# Patient Record
Sex: Female | Born: 1935 | Race: White | Hispanic: No | Smoking: Former smoker
Health system: Southern US, Community
[De-identification: ages and names within clinical notes are randomized; demographics above are authoritative.]

## PROBLEM LIST (undated history)

## (undated) DIAGNOSIS — R519 Headache, unspecified: Secondary | ICD-10-CM

## (undated) DIAGNOSIS — F329 Major depressive disorder, single episode, unspecified: Secondary | ICD-10-CM

## (undated) DIAGNOSIS — I219 Acute myocardial infarction, unspecified: Secondary | ICD-10-CM

## (undated) DIAGNOSIS — R55 Syncope and collapse: Secondary | ICD-10-CM

## (undated) DIAGNOSIS — M542 Cervicalgia: Secondary | ICD-10-CM

## (undated) DIAGNOSIS — Z950 Presence of cardiac pacemaker: Secondary | ICD-10-CM

## (undated) DIAGNOSIS — E119 Type 2 diabetes mellitus without complications: Secondary | ICD-10-CM

## (undated) DIAGNOSIS — M1990 Unspecified osteoarthritis, unspecified site: Secondary | ICD-10-CM

## (undated) DIAGNOSIS — G8929 Other chronic pain: Secondary | ICD-10-CM

## (undated) DIAGNOSIS — R32 Unspecified urinary incontinence: Secondary | ICD-10-CM

## (undated) DIAGNOSIS — S129XXA Fracture of neck, unspecified, initial encounter: Secondary | ICD-10-CM

## (undated) DIAGNOSIS — I509 Heart failure, unspecified: Secondary | ICD-10-CM

## (undated) DIAGNOSIS — Z9289 Personal history of other medical treatment: Secondary | ICD-10-CM

## (undated) DIAGNOSIS — R296 Repeated falls: Secondary | ICD-10-CM

## (undated) DIAGNOSIS — E039 Hypothyroidism, unspecified: Secondary | ICD-10-CM

## (undated) DIAGNOSIS — R51 Headache: Secondary | ICD-10-CM

## (undated) DIAGNOSIS — S42301A Unspecified fracture of shaft of humerus, right arm, initial encounter for closed fracture: Secondary | ICD-10-CM

## (undated) DIAGNOSIS — I639 Cerebral infarction, unspecified: Secondary | ICD-10-CM

## (undated) DIAGNOSIS — J42 Unspecified chronic bronchitis: Secondary | ICD-10-CM

## (undated) DIAGNOSIS — M797 Fibromyalgia: Secondary | ICD-10-CM

## (undated) DIAGNOSIS — I10 Essential (primary) hypertension: Secondary | ICD-10-CM

## (undated) DIAGNOSIS — C4491 Basal cell carcinoma of skin, unspecified: Secondary | ICD-10-CM

## (undated) DIAGNOSIS — D649 Anemia, unspecified: Secondary | ICD-10-CM

## (undated) DIAGNOSIS — E1142 Type 2 diabetes mellitus with diabetic polyneuropathy: Secondary | ICD-10-CM

## (undated) DIAGNOSIS — F419 Anxiety disorder, unspecified: Secondary | ICD-10-CM

## (undated) DIAGNOSIS — R531 Weakness: Secondary | ICD-10-CM

## (undated) HISTORY — DX: Unspecified fracture of shaft of humerus, right arm, initial encounter for closed fracture: S42.301A

## (undated) HISTORY — DX: Syncope and collapse: R55

## (undated) HISTORY — PX: EYE SURGERY: SHX253

## (undated) HISTORY — PX: INSERT / REPLACE / REMOVE PACEMAKER: SUR710

## (undated) HISTORY — DX: Major depressive disorder, single episode, unspecified: F32.9

## (undated) HISTORY — PX: COLONOSCOPY: SHX174

## (undated) HISTORY — PX: ANKLE FRACTURE SURGERY: SHX122

## (undated) HISTORY — PX: OVARIAN CYST SURGERY: SHX726

## (undated) HISTORY — PX: BASAL CELL CARCINOMA EXCISION: SHX1214

## (undated) HISTORY — DX: Repeated falls: R29.6

## (undated) HISTORY — PX: CATARACT EXTRACTION W/ INTRAOCULAR LENS  IMPLANT, BILATERAL: SHX1307

## (undated) HISTORY — PX: GASTRIC BYPASS: SHX52

## (undated) HISTORY — PX: CARPAL TUNNEL RELEASE: SHX101

## (undated) HISTORY — PX: ABDOMINAL HYSTERECTOMY: SHX81

## (undated) HISTORY — PX: CARDIAC CATHETERIZATION: SHX172

## (undated) HISTORY — PX: CHOLECYSTECTOMY OPEN: SUR202

## (undated) HISTORY — DX: Weakness: R53.1

## (undated) HISTORY — DX: Personal history of other medical treatment: Z92.89

## (undated) HISTORY — PX: FRACTURE SURGERY: SHX138

## (undated) HISTORY — PX: FEMUR FRACTURE SURGERY: SHX633

## (undated) HISTORY — PX: TONSILLECTOMY: SUR1361

---

## 1998-03-25 DIAGNOSIS — I639 Cerebral infarction, unspecified: Secondary | ICD-10-CM

## 1998-03-25 HISTORY — DX: Cerebral infarction, unspecified: I63.9

## 2012-08-07 ENCOUNTER — Observation Stay (HOSPITAL_COMMUNITY)
Admission: AD | Admit: 2012-08-07 | Discharge: 2012-08-12 | Disposition: A | Discharge status: Skilled Nursing Facility | Payer: Medicare Other | Attending: Internal Medicine | Admitting: Internal Medicine

## 2012-08-07 ENCOUNTER — Encounter (HOSPITAL_COMMUNITY): Payer: Self-pay | Admitting: *Deleted

## 2012-08-07 ENCOUNTER — Emergency Department (HOSPITAL_COMMUNITY): Payer: Medicare Other

## 2012-08-07 ENCOUNTER — Inpatient Hospital Stay (HOSPITAL_COMMUNITY): Payer: Medicare Other

## 2012-08-07 DIAGNOSIS — I2510 Atherosclerotic heart disease of native coronary artery without angina pectoris: Secondary | ICD-10-CM | POA: Insufficient documentation

## 2012-08-07 DIAGNOSIS — R7402 Elevation of levels of lactic acid dehydrogenase (LDH): Secondary | ICD-10-CM | POA: Insufficient documentation

## 2012-08-07 DIAGNOSIS — S82892D Other fracture of left lower leg, subsequent encounter for closed fracture with routine healing: Secondary | ICD-10-CM

## 2012-08-07 DIAGNOSIS — Z9181 History of falling: Secondary | ICD-10-CM | POA: Insufficient documentation

## 2012-08-07 DIAGNOSIS — E119 Type 2 diabetes mellitus without complications: Secondary | ICD-10-CM | POA: Insufficient documentation

## 2012-08-07 DIAGNOSIS — R296 Repeated falls: Secondary | ICD-10-CM

## 2012-08-07 DIAGNOSIS — E538 Deficiency of other specified B group vitamins: Secondary | ICD-10-CM | POA: Insufficient documentation

## 2012-08-07 DIAGNOSIS — E876 Hypokalemia: Secondary | ICD-10-CM

## 2012-08-07 DIAGNOSIS — E039 Hypothyroidism, unspecified: Secondary | ICD-10-CM | POA: Insufficient documentation

## 2012-08-07 DIAGNOSIS — I6200 Nontraumatic subdural hemorrhage, unspecified: Secondary | ICD-10-CM

## 2012-08-07 DIAGNOSIS — S62109A Fracture of unspecified carpal bone, unspecified wrist, initial encounter for closed fracture: Secondary | ICD-10-CM

## 2012-08-07 DIAGNOSIS — W19XXXA Unspecified fall, initial encounter: Secondary | ICD-10-CM | POA: Insufficient documentation

## 2012-08-07 DIAGNOSIS — Z79899 Other long term (current) drug therapy: Secondary | ICD-10-CM | POA: Insufficient documentation

## 2012-08-07 DIAGNOSIS — Z950 Presence of cardiac pacemaker: Secondary | ICD-10-CM | POA: Insufficient documentation

## 2012-08-07 DIAGNOSIS — I519 Heart disease, unspecified: Secondary | ICD-10-CM | POA: Insufficient documentation

## 2012-08-07 DIAGNOSIS — M25519 Pain in unspecified shoulder: Secondary | ICD-10-CM | POA: Insufficient documentation

## 2012-08-07 DIAGNOSIS — S82109A Unspecified fracture of upper end of unspecified tibia, initial encounter for closed fracture: Secondary | ICD-10-CM | POA: Insufficient documentation

## 2012-08-07 DIAGNOSIS — S62101D Fracture of unspecified carpal bone, right wrist, subsequent encounter for fracture with routine healing: Secondary | ICD-10-CM

## 2012-08-07 DIAGNOSIS — I4891 Unspecified atrial fibrillation: Secondary | ICD-10-CM | POA: Insufficient documentation

## 2012-08-07 DIAGNOSIS — S82899A Other fracture of unspecified lower leg, initial encounter for closed fracture: Secondary | ICD-10-CM

## 2012-08-07 DIAGNOSIS — D649 Anemia, unspecified: Secondary | ICD-10-CM

## 2012-08-07 DIAGNOSIS — Z9884 Bariatric surgery status: Secondary | ICD-10-CM | POA: Insufficient documentation

## 2012-08-07 DIAGNOSIS — IMO0001 Reserved for inherently not codable concepts without codable children: Secondary | ICD-10-CM | POA: Insufficient documentation

## 2012-08-07 DIAGNOSIS — S52599A Other fractures of lower end of unspecified radius, initial encounter for closed fracture: Secondary | ICD-10-CM | POA: Insufficient documentation

## 2012-08-07 DIAGNOSIS — S065X9A Traumatic subdural hemorrhage with loss of consciousness of unspecified duration, initial encounter: Secondary | ICD-10-CM | POA: Diagnosis present

## 2012-08-07 DIAGNOSIS — F329 Major depressive disorder, single episode, unspecified: Secondary | ICD-10-CM | POA: Insufficient documentation

## 2012-08-07 DIAGNOSIS — S065XAA Traumatic subdural hemorrhage with loss of consciousness status unknown, initial encounter: Secondary | ICD-10-CM

## 2012-08-07 DIAGNOSIS — IMO0002 Reserved for concepts with insufficient information to code with codable children: Secondary | ICD-10-CM

## 2012-08-07 DIAGNOSIS — I10 Essential (primary) hypertension: Secondary | ICD-10-CM | POA: Insufficient documentation

## 2012-08-07 DIAGNOSIS — S62101A Fracture of unspecified carpal bone, right wrist, initial encounter for closed fracture: Secondary | ICD-10-CM

## 2012-08-07 DIAGNOSIS — R55 Syncope and collapse: Principal | ICD-10-CM

## 2012-08-07 DIAGNOSIS — F3289 Other specified depressive episodes: Secondary | ICD-10-CM | POA: Insufficient documentation

## 2012-08-07 DIAGNOSIS — F411 Generalized anxiety disorder: Secondary | ICD-10-CM | POA: Insufficient documentation

## 2012-08-07 DIAGNOSIS — F410 Panic disorder [episodic paroxysmal anxiety] without agoraphobia: Secondary | ICD-10-CM | POA: Insufficient documentation

## 2012-08-07 DIAGNOSIS — S82892A Other fracture of left lower leg, initial encounter for closed fracture: Secondary | ICD-10-CM

## 2012-08-07 DIAGNOSIS — R7401 Elevation of levels of liver transaminase levels: Secondary | ICD-10-CM | POA: Insufficient documentation

## 2012-08-07 DIAGNOSIS — Z8673 Personal history of transient ischemic attack (TIA), and cerebral infarction without residual deficits: Secondary | ICD-10-CM | POA: Insufficient documentation

## 2012-08-07 HISTORY — DX: Anemia, unspecified: D64.9

## 2012-08-07 HISTORY — DX: Presence of cardiac pacemaker: Z95.0

## 2012-08-07 HISTORY — DX: Unspecified osteoarthritis, unspecified site: M19.90

## 2012-08-07 HISTORY — DX: Hypothyroidism, unspecified: E03.9

## 2012-08-07 HISTORY — DX: Unspecified urinary incontinence: R32

## 2012-08-07 LAB — CBC WITH DIFFERENTIAL/PLATELET
HCT: 32.2 % — ABNORMAL LOW (ref 36.0–46.0)
Hemoglobin: 10.5 g/dL — ABNORMAL LOW (ref 12.0–15.0)
Lymphocytes Relative: 14 % (ref 12–46)
MCHC: 32.6 g/dL (ref 30.0–36.0)
Monocytes Absolute: 0.5 10*3/uL (ref 0.1–1.0)
Monocytes Relative: 7 % (ref 3–12)
Neutro Abs: 5.6 10*3/uL (ref 1.7–7.7)
WBC: 7.1 10*3/uL (ref 4.0–10.5)

## 2012-08-07 LAB — COMPREHENSIVE METABOLIC PANEL
BUN: 11 mg/dL (ref 6–23)
CO2: 29 mEq/L (ref 19–32)
Chloride: 100 mEq/L (ref 96–112)
Creatinine, Ser: 0.52 mg/dL (ref 0.50–1.10)
GFR calc non Af Amer: >90 mL/min (ref >90)
Total Bilirubin: 0.5 mg/dL (ref 0.3–1.2)

## 2012-08-07 LAB — GLUCOSE, CAPILLARY: Glucose-Capillary: 166 mg/dL — ABNORMAL HIGH (ref 70–99)

## 2012-08-07 MED ORDER — ZOLPIDEM TARTRATE 5 MG PO TABS
5.0000 mg | ORAL_TABLET | Freq: Every evening | ORAL | Status: DC | PRN
  Administered 2012-08-07: 5 mg via ORAL
  Filled 2012-08-07: qty 1

## 2012-08-07 MED ORDER — ZOLPIDEM TARTRATE 10 MG PO TABS
10.0000 mg | ORAL_TABLET | Freq: Every evening | ORAL | Status: DC | PRN
Start: 2012-08-07 — End: 2012-08-07

## 2012-08-07 MED ORDER — SODIUM CHLORIDE 0.9 % IJ SOLN
3.0000 mL | Freq: Two times a day (BID) | INTRAMUSCULAR | Status: DC
  Administered 2012-08-08 – 2012-08-11 (×4): 3 mL via INTRAVENOUS

## 2012-08-07 MED ORDER — FOLIC ACID 1 MG PO TABS
1.0000 mg | ORAL_TABLET | Freq: Every day | ORAL | Status: DC
  Administered 2012-08-08 – 2012-08-12 (×5): 1 mg via ORAL
  Filled 2012-08-07 (×5): qty 1

## 2012-08-07 MED ORDER — CLONAZEPAM 1 MG PO TABS
1.0000 mg | ORAL_TABLET | Freq: Two times a day (BID) | ORAL | Status: DC | PRN
  Administered 2012-08-07: 1 mg via ORAL
  Filled 2012-08-07: qty 1

## 2012-08-07 MED ORDER — GABAPENTIN 300 MG PO CAPS
300.0000 mg | ORAL_CAPSULE | Freq: Every day | ORAL | Status: DC
  Administered 2012-08-07 – 2012-08-11 (×5): 300 mg via ORAL
  Filled 2012-08-07 (×7): qty 1

## 2012-08-07 MED ORDER — AMLODIPINE BESYLATE 10 MG PO TABS
10.0000 mg | ORAL_TABLET | Freq: Every day | ORAL | Status: DC
  Administered 2012-08-07 – 2012-08-12 (×6): 10 mg via ORAL
  Filled 2012-08-07 (×7): qty 1

## 2012-08-07 MED ORDER — HYDROMORPHONE HCL PF 1 MG/ML IJ SOLN
0.5000 mg | Freq: Once | INTRAMUSCULAR | Status: AC
  Administered 2012-08-07: 0.5 mg via INTRAVENOUS
  Filled 2012-08-07: qty 1

## 2012-08-07 MED ORDER — FLUOXETINE HCL 20 MG PO CAPS
20.0000 mg | ORAL_CAPSULE | Freq: Three times a day (TID) | ORAL | Status: DC
  Administered 2012-08-08 – 2012-08-10 (×7): 20 mg via ORAL
  Filled 2012-08-07 (×10): qty 1

## 2012-08-07 MED ORDER — HYDROMORPHONE HCL PF 1 MG/ML IJ SOLN
0.5000 mg | INTRAMUSCULAR | Status: DC | PRN
  Administered 2012-08-07: 0.5 mg via INTRAVENOUS
  Filled 2012-08-07: qty 1

## 2012-08-07 MED ORDER — ALPRAZOLAM 0.25 MG PO TABS: 0.2500 mg | ORAL_TABLET | Freq: Three times a day (TID) | ORAL | Status: DC | PRN

## 2012-08-07 MED ORDER — LEVOTHYROXINE SODIUM 100 MCG PO TABS
100.0000 ug | ORAL_TABLET | Freq: Every day | ORAL | Status: DC
  Administered 2012-08-08 – 2012-08-12 (×5): 100 ug via ORAL
  Filled 2012-08-07 (×7): qty 1

## 2012-08-07 NOTE — Progress Notes (Signed)
CSW received consult from rn cm that patient son was inquiring about placing patient in a skilled nursing facility. CSW met with pt son in conference room. Pt son shared that patient was waiting in the car and wanted to know if we could help placement the patient into a skilled nursing facility.  Pt son shared that patient has a knee fracture and wrist fracture and was directed by the orthopedist that patient would need to be non weight bearing for 10 weeks. Pt son stated that patient lives a lone and recently moved into Independent Living at [facility]. Pt son stated he had inquired about skilled nursing at [facility] however is unable to afford the out of pocket cost as patient did not have a inpatient qualifying stay at this time.  Patient son shared that the facility told patient son to bring patient to ED to get admitted and then the patient would be admittd to skilled nursing with medicare covering.  Pt son and CSW discussed that patient would need an 3 night qualifying inpatient stay in the hospital stay in order for medicare to pay for skilled nursing placement for patient.  Pt son shared he was unable to care for her in his home due to stairs, and pt son did not show interest in hiring someone to stay with patient in independent living, having home health services provided by medicare,  or paying privately for skilled nursing at this time.    CSW and pt discussed that in order to determine if patient needed to be medically admitted to the hospital, patient would have to be evaluated by the ED physician. CSW and pt discussed that if there was not a medical reason for patient to be medically admitted to the hospital then medicare would still not cover skilled nursing, however home health could be covered and the pt family could assist with pt needs or hire private duty nursing. Pt son stated he wasn't sure if he wanted to wait or pay for the patient to be seen by the physician. Patient son  stated that if patient isn't going to be admitted it would be a waste of time. RN CM and CSW explained again that patient would have to be evaluated by physician to determine if patient would need to be medically admitted.   The RN CM tried to reach patient pcp and orthopedist in order to assist with home health. While waiting to hear back from orthopedist, patient son went to talk with patient. Patient son decided to bring patient in to be evaluated for medical admission.   Catha Gosselin, LCSWA  [phone] .08/07/2012 1746

## 2012-08-07 NOTE — H&P (Signed)
[facility] History and Physical  Ilaria Much JXB:[id number] DOB: 10/28/1935 DOA: 08/07/2012  Referring physician: ED PCP: No primary provider on file.  Specialists: none  Chief Complaint: Recurrent falls  HPI: Meredith Gonzalez is a 77 y.o. female who fell twice yesterday, once with a syncopal episode who presents to the ED.  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  Unfortunately the falls from yesterday have left her with an acute but small SDH, a broken L knee that she is NWB on for the next 10 weeks per Dr. Thomasena Edis, and a broken R wrist.  Son is unable to take care of her at home so medicine is being asked to admit her for the required 3 day "qualifying stay" under medicare rules for SNF.  Review of Systems: 12 systems reviewed and otherwise negative.  Past Medical History  Diagnosis Date  . Hypothyroidism   . Hyperthyroidism   . Depression   . Diabetes mellitus without complication   . Pacemaker   . Stroke 2000  . Headache   . Cancer     basal cell skin cancers  . Arthritis   . Anemia   . Incontinence of bowel   . Incontinence of urine    Past Surgical History  Procedure Laterality Date  . Pacemaker insertion    . Basal cell cancer removal    . Abdominal hysterectomy    . Cholecystectomy    . Femur fracture surgery      bilateral  . Right ankle surgery    . Carpal tunnel release      bilateral  . Gastric bypass    . Eye surgery      right eye catarace/lens implant  . Colonoscopy     Social History:  reports that she quit smoking about 40 years ago. Her smoking use included Cigarettes. She has a 2 pack-year smoking history. She has never used smokeless tobacco. She reports that  drinks alcohol. She reports that she does not use illicit drugs.   Allergies  Allergen  Reactions  . Valium (Diazepam)     Makes patient hyper  . Codeine Hives and Itching    Family History  Problem Relation Age of Onset  . Congestive Heart Failure Mother   . Heart attack Father   . Alcoholism Father   . Alcoholism Sister   . Alcoholism Brother   . Alcoholism Brother   . Throat cancer Brother     Prior to Admission medications   Medication Sig Start Date End Date Taking? Authorizing Provider  ALPRAZolam (XANAX) 0.25 MG tablet Take 0.25 mg by mouth 3 (three) times daily as needed for sleep.   Yes Historical Provider, MD  amLODipine (NORVASC) 10 MG tablet Take 10 mg by mouth daily.   Yes Historical Provider, MD  clonazePAM (KLONOPIN) 1 MG tablet Take 1 mg by mouth 2 (two) times daily as needed for anxiety.   Yes Historical Provider, MD  FLUoxetine (PROZAC) 20 MG tablet Take 20 mg by mouth 3 (three) times daily.   Yes Historical Provider, MD  folic acid (FOLVITE) 1 MG tablet Take 1 mg by mouth daily.   Yes Historical Provider, MD  gabapentin (NEURONTIN) 300 MG capsule Take 300 mg by mouth at bedtime.    Yes Historical Provider, MD  levothyroxine (SYNTHROID, LEVOTHROID) 100  MCG tablet Take 100 mcg by mouth daily.   Yes Historical Provider, MD  oxyCODONE-acetaminophen (PERCOCET/ROXICET) 5-325 MG per tablet Take 1-2 tablets by mouth every 4 (four) hours as needed for pain.   Yes Historical Provider, MD  Terbinafine HCl (LAMISIL PO) Take 1 tablet by mouth daily.    Yes Historical Provider, MD  traMADol (ULTRAM) 50 MG tablet Take 50 mg by mouth as needed for pain.   Yes Historical Provider, MD  zolpidem (AMBIEN) 10 MG tablet Take 10 mg by mouth at bedtime as needed for sleep.   Yes Historical Provider, MD   Physical Exam: Filed Vitals:   08/07/12 1738 08/07/12 2051 08/07/12 2052 08/07/12 2058  BP: 168/105 183/80 183/80 174/77  Pulse: 83 84 88 92  Temp: 98.6 F (37 C)     TempSrc: Oral     Resp: 20     SpO2: 97%       General:  NAD, resting comfortably in bed Eyes:  PEERLA EOMI ENT: mucous membranes moist Neck: supple w/o JVD Cardiovascular: RRR w/o MRG Respiratory: CTA B Abdomen: soft, nt, nd, bs+ Skin: no rash nor lesion Musculoskeletal: L knee and R wrist splinted Psychiatric: normal tone and affect Neurologic: AAOx3, grossly non-focal  Labs on Admission:  Basic Metabolic Panel:  Recent Labs Lab 08/07/12 1840  NA 138  K 3.4*  CL 100  CO2 29  GLUCOSE 133*  BUN 11  CREATININE 0.52  CALCIUM 8.9   Liver Function Tests:  Recent Labs Lab 08/07/12 1840  AST 139*  ALT 118*  ALKPHOS 148*  BILITOT 0.5  PROT 6.8  ALBUMIN 3.1*   No results found for this basename: LIPASE, AMYLASE,  in the last 168 hours No results found for this basename: AMMONIA,  in the last 168 hours CBC:  Recent Labs Lab 08/07/12 1840  WBC 7.1  NEUTROABS 5.6  HGB 10.5*  HCT 32.2*  MCV 96.7  PLT 163   Cardiac Enzymes: No results found for this basename: CKTOTAL, CKMB, CKMBINDEX, TROPONINI,  in the last 168 hours  BNP (last 3 results) No results found for this basename: PROBNP,  in the last 8760 hours CBG: No results found for this basename: GLUCAP,  in the last 168 hours  Radiological Exams on Admission: Ct Head Wo Contrast  08/07/2012   *RADIOLOGY REPORT*  Clinical Data: Multiple falls.  CT HEAD WITHOUT CONTRAST  Technique:  Contiguous axial images were obtained from the base of the skull through the vertex without contrast.  Comparison: None.  Findings: Left, anterior parafalcine subdural hematoma is identified.  This measures 11 mm in thickness and has mild mass effect upon the left frontal lobe.  There is diffuse patchy low density throughout the subcortical and periventricular white matter consistent with chronic small vessel ischemic change.  There is prominence of the sulci and ventricles consistent with brain atrophy.  The paranasal sinuses are clear.  The mastoid air cells are clear. The skull is intact.  IMPRESSION:  1.  Small vessel ischemic  disease and brain atrophy. 2.  There is an acute left anterior parafalcine subdural hematoma measuring 11 mm in thickness.  Critical Value/emergent results were called by telephone at the time of interpretation on 08/07/2012 at 6:48 p.m. to Dr. Judd Lien, who verbally acknowledged these results.   Original Report Authenticated By: Signa Kell, M.D.    EKG: Independently reviewed.   Assessment/Plan Principal Problem:   Knee fracture, left Active Problems:   Right wrist fracture   Recurrent falls  Syncope   SDH (subdural hematoma)   1. L knee fracture and R wrist fracture - NWB on L knee per Dr. Thomasena Edis, admitting to inpatient for 3 day stay so she can be admitted to SNF as she really isnt able to ambulate and son cannot take care of her. 2. Syncope - chronic and recurrent, ordering echo and keeping patient on tele while here but this has been worked up over the past couple of years by a different facility, obtaining records [facility]. 3. SDH - per neurosurg no need for surgery or transfer to [facility] at this time, re-assess patients neuro checks Q4H, and check repeat CT head in AM. 4. H/o DM2 - on no meds for this, ordering carb modified diet and check BGL q6h    Code Status: Full Code (must indicate code status--if unknown or must be presumed, indicate so) Family Communication: Spoke with son at bedside (indicate person spoken with, if applicable, with phone number if by telephone) Disposition Plan: Admit to inpatient (indicate anticipated LOS)  Time spent: 70 min  GARDNER, JARED M. [facility] Pager [phone]  If 7PM-7AM, please contact night-coverage [email or web address] Password [facility] 08/07/2012, 9:25 PM

## 2012-08-07 NOTE — ED Provider Notes (Signed)
History     CSN: [id number]  Arrival date & time 08/07/12  1735   First MD Initiated Contact with Patient 08/07/12 1737      Chief Complaint  Patient presents with  . Knee Pain  . Shoulder Pain    (Consider location/radiation/quality/duration/timing/severity/associated sxs/prior treatment) HPI Comments: Patient with history of recurrent syncopal episodes for the past two years.  These have been occurring with greater frequency over the past week.  She lives in [place] and was driving here to relocate to the area.  [place] she began to feel weak and had to pull over.  She got out of the car and passed out, injuring her wrist.  Shortly afterward, the same thing occurred and she injured her knee.  She was seen at another facility and was found to have fractures of the left knee and right wrist.  Her son drove there to pick her up and bring her here.  She was seen by orthopedics today in the office.  Since that time she has had difficulty ambulating and her son does not feel as though he can adequately care for her.  She is unsteady and at risk for falls.    She has been worked up for this syncope in the past but no cause has been found.  She was told her heart monitor had an irregular beat but she is unsure of the exact diagnosis.  She has a pacemaker.    She denies any recent chest pain, shortness, of breath, cough, or any other symptoms with the exception of pain in the knee and wrist that are fractured.     No past medical history on file.  No past surgical history on file.  No family history on file.  History  Substance Use Topics  . Smoking status: Not on file  . Smokeless tobacco: Not on file  . Alcohol Use: Not on file    OB History   No data available      Review of Systems  Musculoskeletal:       Knee and elbow pain per hpi  Neurological: Positive for syncope.  All other systems reviewed and are negative.    Allergies  Codeine  Home  Medications  No current outpatient prescriptions on file.  BP 168/105  Pulse 83  Temp(Src) 98.6 F (37 C) (Oral)  Resp 20  SpO2 97%  Physical Exam  Nursing note and vitals reviewed. Constitutional: She is oriented to person, place, and time. She appears well-developed and well-nourished. No distress.  HENT:  Head: Normocephalic and atraumatic.  Mouth/Throat: Oropharynx is clear and moist.  Eyes: EOM are normal. Pupils are equal, round, and reactive to light.  Neck: Normal range of motion. Neck supple.  Cardiovascular: Normal rate and regular rhythm.  Exam reveals no gallop and no friction rub.   No murmur heard. Pulmonary/Chest: Effort normal and breath sounds normal. No respiratory distress. She has no wheezes.  Abdominal: Soft. Bowel sounds are normal. She exhibits no distension. There is no tenderness.  Musculoskeletal: Normal range of motion.  The left knee and right arm are in and immoblizer, splint.  Lymphadenopathy:    She has no cervical adenopathy.  Neurological: She is alert and oriented to person, place, and time. No cranial nerve deficit. She exhibits normal muscle tone. Coordination normal.  Skin: Skin is warm and dry. She is not diaphoretic.    ED Course  Procedures (including critical care time)  Labs Reviewed - No  data to display No results found.   No diagnosis found.   Date: 08/07/2012  Rate: 80  Rhythm: normal sinus rhythm  QRS Axis: left  Intervals: normal  ST/T Wave abnormalities: nonspecific T wave changes  Conduction Disutrbances:nonspecific intraventricular conduction delay  Narrative Interpretation:   Old EKG Reviewed: none available    MDM  The patient presents here with complaints of frequent syncopal events causing fractures of her knee and elbow.  I initiated a workup into this including a ct of the head, labs, ekg, and chest xray.  These were basically unremarkable with the exception of the head ct on which she was also found to have a  subdural hematoma.  As these syncopal spells are causing her to fracture limbs and head bleeds, I feel as though she requires medical admission for further workup into the cause of these.    I did speak with Dr. Venetia Maxon from neurosurgery to discuss the ct findings.  He did not feel as though there was anything for them to offer, just to repeat the ct in the am.  I also spoke with Dr. Abbey Chatters from surgery who did not feel as though trauma surgery had anything to offer, but medical admission was indicated.  I then called Dr. Julian Reil from [facility] who came to evaluate the patient and agrees to admit.          Geoffery Lyons, MD 08/07/12 2312

## 2012-08-07 NOTE — Progress Notes (Signed)
Son states pt has medical record information at a hospital near [place]

## 2012-08-07 NOTE — ED Notes (Signed)
Pt's son came in earlier today to ask about admitting his mother who needs a qualifying stay for medicare to be able to place her into a SNIF.  Pt has a broken knee and has seen an orthopedist.  The orthopedist told her that she cannot bear weight on that leg for 10 wks.  The son states that he is unable to care for her and was told to come to the ER for a qualifying visit.  Son was told that they need to check with her primary care doctor for placement.  About an hour later, the patient's son came back to the ER window asking for help taking her to the bathroom.  Pt ended up checking in as a patient for pain and hypertension.

## 2012-08-07 NOTE — ED Notes (Signed)
MD at bedside. 

## 2012-08-07 NOTE — ED Notes (Signed)
WUJ:[id number]<JY> Expected date:<BR> Expected time:<BR> Means of arrival:<BR> Comments:<BR> Pt from triage

## 2012-08-07 NOTE — Progress Notes (Signed)
Prior to pt being brought into [facility] ED triage, CM contacted by ED charge, Victorino Dike to answer questions for pt's son about medicare guidelines for admission.  CM met son at triage desk, took him to [facility] conference room to speak with him. He informed Cm that he had went to get his mother from a hospital in [place] on 08/06/12 after she had "fractured her knee" The son reports pt lives in [place] and had went to back but on her return she stoppe don the side of the road to get something out of her car and "fractured her knee". He reports that she had recently fractured her wrist also.  He reports pt was told by the hospital in [place] to follow up with an orthopedic provider. Son reports pt saw Dr Eugenia Mcalpine at [facility] on 08/07/12 for the first time and he was told the pt would need mobilization of her "knee for ten weeks" Reports knee was immobilized prior to leaving orthopedic provider office.  Son reports the pt was in his car with the air condition on and with the doors locked in front of the [facility] ED.  CM discussed the need for the pt to be evaluated by an EDP for a reason for admission, answered questions about medicare guideline for a 3 day qualifying stay for snf admission, home health services, private duty nursing services, and community placement using home health services of a RN, PT/OT, SW and the family MD or orthopedic. Son reports pt has not been to a [facility] hospital and has not used home health services Reports he has used a Chartered certified [facility] in [place]. Son reports he is in the process of getting his mother in [facility] Independent apartments in [place], [place] He reports calling [facility] to inquire if pt could to the facilities snf level and was informed the pt had to come to the ED for a "3 day stay so that Medicare part A and part B would pay". CM referred son to ED SW to answer questions about facility placement  1633 CM reviewed  criteria for fractured knee and wrist, consulted an admitting MD CM discussed the response with the son while the ED SW was visiting him and informed him that if he chose pt could be evaluated by and EDP 1645 ED CM spoke with Elgie Collard at Dr Thomasena Edis office to request to speak to Dr Thomasena Edis RN but informed they were in clinic CM requested to leave a message for a return call from RN or Dr Thomasena Edis and need for possible home health orders for [facility], PT/OT, Sw and aide. Elgie Collard stated pt had a "wrist sprain" Pending a return call back from Dr Thomasena Edis or his RN Son reports he is married with a 2 level home without a bedroom downstairs (but a living room area where a hospital bed could be placed if needed) and 6 steps going into the home He reports assisting the pt out of the home today to go to Dr Thomasena Edis office. SW and CM discussed that home health would be able to go to his home or her independent living apartment at [employer]  SW stated son contacted the pcp who is on "vacation until Tuesday" and had been told by pcp to bring the pt "to the ED".  Triage nurse reported an odor and elevated BP Pending EDP evaluation of pt

## 2012-08-07 NOTE — ED Notes (Signed)
Pt going to xray. Will obtain lab draw upon pts return

## 2012-08-08 ENCOUNTER — Other Ambulatory Visit: Payer: Self-pay | Admitting: Orthopedic Surgery

## 2012-08-08 ENCOUNTER — Inpatient Hospital Stay (HOSPITAL_COMMUNITY): Payer: Medicare Other

## 2012-08-08 DIAGNOSIS — I369 Nonrheumatic tricuspid valve disorder, unspecified: Secondary | ICD-10-CM

## 2012-08-08 DIAGNOSIS — E876 Hypokalemia: Secondary | ICD-10-CM

## 2012-08-08 DIAGNOSIS — S065X9A Traumatic subdural hemorrhage with loss of consciousness of unspecified duration, initial encounter: Secondary | ICD-10-CM | POA: Diagnosis present

## 2012-08-08 DIAGNOSIS — D649 Anemia, unspecified: Secondary | ICD-10-CM

## 2012-08-08 LAB — GLUCOSE, CAPILLARY: Glucose-Capillary: 91 mg/dL (ref 70–99)

## 2012-08-08 LAB — BASIC METABOLIC PANEL
CO2: 27 mEq/L (ref 19–32)
Chloride: 98 mEq/L (ref 96–112)
Creatinine, Ser: 0.47 mg/dL — ABNORMAL LOW (ref 0.50–1.10)
GFR calc Af Amer: >90 mL/min (ref >90)
Sodium: 135 mEq/L (ref 135–145)

## 2012-08-08 LAB — CBC
MCV: 97.2 fL (ref 78.0–100.0)
Platelets: 163 10*3/uL (ref 150–400)
RBC: 3.24 MIL/uL — ABNORMAL LOW (ref 3.87–5.11)
RDW: 13.6 % (ref 11.5–15.5)
WBC: 6.7 10*3/uL (ref 4.0–10.5)

## 2012-08-08 LAB — CK: Total CK: 112 U/L (ref 7–177)

## 2012-08-08 MED ORDER — ZOLPIDEM TARTRATE 5 MG PO TABS
5.0000 mg | ORAL_TABLET | Freq: Once | ORAL | Status: AC
  Administered 2012-08-08: 5 mg via ORAL
  Filled 2012-08-08: qty 1

## 2012-08-08 MED ORDER — OXYCODONE-ACETAMINOPHEN 5-325 MG PO TABS
1.0000 | ORAL_TABLET | ORAL | Status: DC | PRN
  Administered 2012-08-08 – 2012-08-09 (×2): 2 via ORAL
  Filled 2012-08-08 (×4): qty 2

## 2012-08-08 MED ORDER — HYDROMORPHONE HCL PF 1 MG/ML IJ SOLN
0.5000 mg | INTRAMUSCULAR | Status: DC | PRN
  Administered 2012-08-08 – 2012-08-11 (×14): 0.5 mg via INTRAVENOUS
  Filled 2012-08-08 (×14): qty 1

## 2012-08-08 MED ORDER — ALPRAZOLAM 0.25 MG PO TABS
0.2500 mg | ORAL_TABLET | Freq: Two times a day (BID) | ORAL | Status: DC | PRN
  Administered 2012-08-11 – 2012-08-12 (×2): 0.25 mg via ORAL
  Filled 2012-08-08 (×2): qty 1

## 2012-08-08 MED ORDER — ACETAMINOPHEN 325 MG PO TABS
650.0000 mg | ORAL_TABLET | Freq: Four times a day (QID) | ORAL | Status: DC | PRN
  Administered 2012-08-09: 650 mg via ORAL
  Filled 2012-08-08: qty 2

## 2012-08-08 MED ORDER — DIPHENHYDRAMINE HCL 25 MG PO CAPS
25.0000 mg | ORAL_CAPSULE | Freq: Four times a day (QID) | ORAL | Status: DC | PRN
  Administered 2012-08-08 – 2012-08-12 (×4): 25 mg via ORAL
  Filled 2012-08-08 (×5): qty 1

## 2012-08-08 MED ORDER — POTASSIUM CHLORIDE CRYS ER 20 MEQ PO TBCR
40.0000 meq | EXTENDED_RELEASE_TABLET | Freq: Once | ORAL | Status: AC
Start: 2012-08-08 — End: 2012-08-08
  Administered 2012-08-08: 40 meq via ORAL
  Filled 2012-08-08: qty 2

## 2012-08-08 NOTE — Progress Notes (Signed)
  Echocardiogram 2D Echocardiogram has been performed.  Meredith Gonzalez 08/08/2012, 9:39 AM

## 2012-08-08 NOTE — Plan of Care (Signed)
Problem: Phase I Progression Outcomes Goal: OOB as tolerated unless otherwise ordered Outcome: Not Applicable Date Met:  08/08/12 NWB for 10 weeks per MD

## 2012-08-08 NOTE — Progress Notes (Signed)
Pt reporting severe generalized itching and headache. Oxycodone given earlier for pain and pt reports having this reaction to codeine in the past. MD notified. Benadryl ordered. Will continue to monitor. Zaire Levesque, Lavone Orn, RN

## 2012-08-08 NOTE — Progress Notes (Addendum)
[facility] PROGRESS NOTE  Meredith Gonzalez ZOX:[id number] DOB: 08-31-1935 DOA: 08/07/2012 PCP: No primary provider on file.  Brief narrative 77 year old female patient, resident of [place], [place]  gets all her care in [place] or [place], [place], has PMH of hypothyroidism, depression, DM, pacemaker, recurrent syncopal episodes for the last 2 years who has been extensively evaluated (CT head, EEG, 2-D echo and seven-day heart monitor). She is in the process of moving to [facility]. She sustained a right wrist fracture in her hometown requiring a cast. While driving to the [place] area, she made a rest stop and at that time had a syncopal event, fell, hit her head and sustained left knee fracture. She was admitted to an outside facility. After discharge, her son picked her up and she was seen by orthopedic M.D. who apparently recommended nonweightbearing for 10 weeks. Obviously patient has difficulty ambulating and her son is unable to take care of her and is looking for SNF placement. She was brought to the ED on 08/07/12. CT head showed subdural hematoma-findings were discussed by EDP with Dr. Venetia Maxon of neurosurgery who recommended repeat CT this morning but no other intervention. Patient was admitted for further evaluation and management.  Assessment/Plan: 1. Recurrent syncope and falls: Etiology not clear. Per patient, she has been extensively evaluated by her PCP, cardiology and neurology in her hometown. She recently had a heart monitor and was told that she has A. fib and was supposed to start a blood thinner. Will request medical records from her primary physician's. Continue to monitor on telemetry. Followup 2-D echo. Repeat head CT shows improvement. DC tramadol and given potential interaction with Prozac. 2. Subdural hematoma: Secondary to fall and head injury. Patient alert and oriented. Repeat head CT 5/17 shows improvement. 3. Hypokalemia: Replete and follow BMP 4. Anemia:  Possibly chronic and stable. 5. Mild transaminitis: No GI symptoms? Rhabdomyolysis from dual fractures and fall. Check CK. Trend LFTs. 6. Right wrist fracture and left knee fracture: Continue right wrist cast and left knee immobilizer. To clarify weightbearing status with orthopedics and then request a PT and OT evaluation for SNF placement. Pain management. 7. Depression and anxiety/panic attacks: Continue fluoxetine which she takes 60 mg daily and Xanax which she uses infrequently. Stable. 8. Diet control type II DM: 9. Hypothyroidism:  Code Status: Full Family Communication: Discussed with patient Disposition Plan: SNF   Consultants:  None  Procedures:  None  Antibiotics:  None   HPI/Subjective: Complains of pain in the left knee >right wrist. Denies headache. No palpitations, chest pain or dyspnea.  Objective: Filed Vitals:   08/07/12 2058 08/07/12 2222 08/08/12 0223 08/08/12 0448  BP: 174/77 175/74 150/62 152/66  Pulse: 92 87 84 88  Temp:  98.6 F (37 C)  98.1 F (36.7 C)  TempSrc:  Oral  Oral  Resp:  20  18  Height:  5\' 8"  (1.727 m)    Weight:  62 kg (136 lb 11 oz)    SpO2:  100%  100%    Intake/Output Summary (Last 24 hours) at 08/08/12 1134 Last data filed at 08/07/12 2150  Gross per 24 hour  Intake      0 ml  Output      0 ml  Net      0 ml   Filed Weights   08/07/12 2222  Weight: 62 kg (136 lb 11 oz)    Exam:   General exam: Comfortable. Lying supine in bed.  Respiratory system: Clear.  No increased work of breathing.  Cardiovascular system: S1 & S2 heard, RRR. No JVD, murmurs, gallops, clicks or pedal edema. Telemetry shows sinus rhythm.  Gastrointestinal system: Abdomen is nondistended, soft and nontender. Normal bowel sounds heard.  Central nervous system: Alert and oriented. No focal neurological deficits.  Extremities: Right forearm and wrist in a cast- intact and able to wiggle fingers. Left lower extremity in immobilizer-able to  wiggle toes and peripheral pulses felt.   Data Reviewed: Basic Metabolic Panel:  Recent Labs Lab 08/07/12 1840 08/08/12 0512  NA 138 135  K 3.4* 3.2*  CL 100 98  CO2 29 27  GLUCOSE 133* 107*  BUN 11 8  CREATININE 0.52 0.47*  CALCIUM 8.9 8.5   Liver Function Tests:  Recent Labs Lab 08/07/12 1840  AST 139*  ALT 118*  ALKPHOS 148*  BILITOT 0.5  PROT 6.8  ALBUMIN 3.1*   No results found for this basename: LIPASE, AMYLASE,  in the last 168 hours No results found for this basename: AMMONIA,  in the last 168 hours CBC:  Recent Labs Lab 08/07/12 1840 08/08/12 0512  WBC 7.1 6.7  NEUTROABS 5.6  --   HGB 10.5* 10.0*  HCT 32.2* 31.5*  MCV 96.7 97.2  PLT 163 163   Cardiac Enzymes: No results found for this basename: CKTOTAL, CKMB, CKMBINDEX, TROPONINI,  in the last 168 hours BNP (last 3 results) No results found for this basename: PROBNP,  in the last 8760 hours CBG:  Recent Labs Lab 08/07/12 2350 08/08/12 0653  GLUCAP 166* 91    No results found for this or any previous visit (from the past 240 hour(s)).   Studies: Ct Head Wo Contrast  08/08/2012   *RADIOLOGY REPORT*  Clinical Data: Recurrent falls  CT HEAD WITHOUT CONTRAST  Technique:  Contiguous axial images were obtained from the base of the skull through the vertex without contrast.  Comparison: 08/07/2012  Findings: The left anterior parafalcine extra-axial hematoma has improved and now it is 10 mm in thickness.  No midline shift.  No evidence of new intra-axial hemorrhage.  Chronic ischemic changes in the periventricular white matter and global atrophy are stable.  IMPRESSION: Slightly improved left anterior parafalcine extra-axial hemorrhage. No new hemorrhage.  Chronic ischemic changes and atrophy are noted.   Original Report Authenticated By: Jolaine Click, M.D.   Ct Head Wo Contrast  08/07/2012   *RADIOLOGY REPORT*  Clinical Data: Multiple falls.  CT HEAD WITHOUT CONTRAST  Technique:  Contiguous axial  images were obtained from the base of the skull through the vertex without contrast.  Comparison: None.  Findings: Left, anterior parafalcine subdural hematoma is identified.  This measures 11 mm in thickness and has mild mass effect upon the left frontal lobe.  There is diffuse patchy low density throughout the subcortical and periventricular white matter consistent with chronic small vessel ischemic change.  There is prominence of the sulci and ventricles consistent with brain atrophy.  The paranasal sinuses are clear.  The mastoid air cells are clear. The skull is intact.  IMPRESSION:  1.  Small vessel ischemic disease and brain atrophy. 2.  There is an acute left anterior parafalcine subdural hematoma measuring 11 mm in thickness.  Critical Value/emergent results were called by telephone at the time of interpretation on 08/07/2012 at 6:48 p.m. to Dr. Judd Lien, who verbally acknowledged these results.   Original Report Authenticated By: Signa Kell, M.D.     Additional labs:   Scheduled Meds: . amLODipine  10  mg Oral Daily  . FLUoxetine  20 mg Oral TID  . folic acid  1 mg Oral Daily  . gabapentin  300 mg Oral QHS  . levothyroxine  100 mcg Oral Daily  . sodium chloride  3 mL Intravenous Q12H   Continuous Infusions:   Principal Problem:   Knee fracture, left Active Problems:   Right wrist fracture   Recurrent falls   Syncope   SDH (subdural hematoma)    Time spent: 40 minutes    [facility]  [facility] Pager [phone].   If 8PM-8AM, please contact night-coverage at [email or web address], password [facility] 08/08/2012, 11:34 AM  LOS: 1 day

## 2012-08-08 NOTE — Care Management Note (Signed)
Cm noted CM consult, pt first consulted by ED CM. CSW consult for SNF placement. Pt will require PT/OT eval. Cm to sign off related to current disposition.   Roxy Manns Vencent Hauschild,RN,BSN [phone]

## 2012-08-08 NOTE — Progress Notes (Signed)
Patient is atrial paced and had a pair of PVCs around 0455 this AM. Patient was asymptomatic and vitals were stable. MD made aware. Will continue to monitor.  Leeroy Cha

## 2012-08-09 LAB — GLUCOSE, CAPILLARY
Glucose-Capillary: 122 mg/dL — ABNORMAL HIGH (ref 70–99)
Glucose-Capillary: 118 mg/dL — ABNORMAL HIGH (ref 70–99)
Glucose-Capillary: 126 mg/dL — ABNORMAL HIGH (ref 70–99)
Glucose-Capillary: 119 mg/dL — ABNORMAL HIGH (ref 70–99)

## 2012-08-09 LAB — CBC
MCH: 31 pg (ref 26.0–34.0)
MCHC: 31.9 g/dL (ref 30.0–36.0)
MCV: 97.2 fL (ref 78.0–100.0)
Platelets: 176 10*3/uL (ref 150–400)
RDW: 13.5 % (ref 11.5–15.5)
WBC: 7 10*3/uL (ref 4.0–10.5)

## 2012-08-09 LAB — COMPREHENSIVE METABOLIC PANEL
ALT: 59 U/L — ABNORMAL HIGH (ref 0–35)
Albumin: 2.9 g/dL — ABNORMAL LOW (ref 3.5–5.2)
Alkaline Phosphatase: 119 U/L — ABNORMAL HIGH (ref 39–117)
BUN: 9 mg/dL (ref 6–23)
Chloride: 97 mEq/L (ref 96–112)
Potassium: 3.7 mEq/L (ref 3.5–5.1)
Sodium: 135 mEq/L (ref 135–145)
Total Bilirubin: 0.5 mg/dL (ref 0.3–1.2)

## 2012-08-09 MED ORDER — ZOLPIDEM TARTRATE 5 MG PO TABS
5.0000 mg | ORAL_TABLET | Freq: Every evening | ORAL | Status: DC | PRN
  Administered 2012-08-09 – 2012-08-12 (×3): 5 mg via ORAL
  Filled 2012-08-09 (×3): qty 1

## 2012-08-09 NOTE — Progress Notes (Signed)
Clinical Social Work Department CLINICAL SOCIAL WORK PLACEMENT NOTE 08/09/2012  Patient:  Meredith Gonzalez, Meredith Gonzalez  Account Number:  [id number] Admit date:  08/07/2012  Clinical Social Worker:  Doroteo Glassman  Date/time:  08/08/2012 12:00 M  Clinical Social Work is seeking post-discharge placement for this patient at the following level of care:   SKILLED NURSING   (*CSW will update this form in Epic as items are completed)   declined  Patient/family provided with [facility] Department of Clinical Social Work's list of facilities offering this level of care within the geographic area requested by the patient (or if unable, by the patient's family).   08/08/12 Patient/family informed of their freedom to choose among providers that offer the needed level of care, that participate in Medicare, Medicaid or managed care program needed by the patient, have an available bed and are willing to accept the patient.  N/a--only wants facilities in [employer]  Patient/family informed of [facility]' ownership interest in [facility], as well as of the fact that they are under no obligation to receive care at this facility.  PASARR submitted to EDS on 08/08/2012 PASARR number received from EDS on 08/08/2012  FL2 transmitted to all facilities in geographic area requested by pt/family on  08/08/2012 FL2 transmitted to all facilities within larger geographic area on   Patient informed that his/her managed care company has contracts with or will negotiate with  certain facilities, including the following:     Patient/family informed of bed offers received:   Patient chooses bed at  Physician recommends and patient chooses bed at    Patient to be transferred to  on   Patient to be transferred to facility by   The following physician request were entered in Epic:   Additional Comments:  Providence Crosby, Theresia Majors Clinical Social Work [phone]

## 2012-08-09 NOTE — Progress Notes (Signed)
Clinical Social Work Department BRIEF PSYCHOSOCIAL ASSESSMENT 08/09/2012  Patient:  [facility]     Account Number:  [id number]     Admit date:  08/07/2012  Clinical Social Worker:  Doroteo Glassman  Date/Time:  08/08/2012 12:00 M  Referred by:  Physician  Date Referred:  08/09/2012 Referred for  SNF Placement   Other Referral:   Interview type:  Patient Other interview type:    PSYCHOSOCIAL DATA Living Status:  ALONE Admitted from facility:   Level of care:   Primary support name:  Briannah Lona Primary support relationship to patient:  CHILD, ADULT Degree of support available:   adequate    CURRENT CONCERNS Current Concerns  Post-Acute Placement   Other Concerns:    SOCIAL WORK ASSESSMENT / PLAN Met with Pt to discuss d/c plan.    Pt stated that she would like to go to [facility] in [place] after d/c.  She stated that her son lives in [place] and that [facility] would be convenient for her and her son.  She stated that her son has been in contact with this facility and that they are aware of her skilled needs. Pt agreed to being faxed out to all of [facility]    CSW thanked Pt for her time.   Assessment/plan status:  Psychosocial Support/Ongoing Assessment of Needs Other assessment/ plan:   Information/referral to community resources:   n/a--Pt wanting to go to [facility]    PATIENT'S/FAMILY'S RESPONSE TO PLAN OF CARE: Pt thanked CSW for time and assistance.   Providence Crosby, LCSWA Clinical Social Work [phone]

## 2012-08-09 NOTE — Progress Notes (Signed)
[facility] PROGRESS NOTE  Meredith Gonzalez ZOX:[id number] DOB: 29-Nov-1935 DOA: 08/07/2012 PCP: No primary provider on file.  Brief narrative 77 year old female patient, resident of [place], [place]  gets all her care in [place] or [place], [place], has PMH of hypothyroidism, depression, DM, pacemaker, recurrent syncopal episodes for the last 2 years who has been extensively evaluated (CT head, EEG, 2-D echo and seven-day heart monitor). She is in the process of moving to [facility]. She sustained a right wrist fracture in her hometown requiring a cast. While driving to the [place] area, she made a rest stop and at that time had a syncopal event, fell, hit her head and sustained left knee fracture. She was admitted to an outside facility. After discharge, her son picked her up and she was seen by orthopedic M.D. who apparently recommended nonweightbearing for 10 weeks. Obviously patient has difficulty ambulating and her son is unable to take care of her and is looking for SNF placement. She was brought to the ED on 08/07/12. CT head showed subdural hematoma-findings were discussed by EDP with Dr. Venetia Maxon of neurosurgery who recommended repeat CT this morning but no other intervention. Patient was admitted for further evaluation and management.  Assessment/Plan: 1. Recurrent syncope and falls: Etiology not clear. Per patient, she has been extensively evaluated by her PCP, cardiology and neurology in her hometown. She recently had a heart monitor and was told that she has A. fib and was supposed to start a blood thinner. Requested medical records from her primary physician's-not available yet. Continue to monitor on telemetry. 2-D echo: LVEF 55-60%, grade 1 diastolic dysfunction and PA pressure 53 mmHg. Repeat head CT shows improvement. DC tramadol and given potential interaction with Prozac. 2. Subdural hematoma: Secondary to fall and head injury. Patient alert and oriented. Repeat head CT 5/17  shows improvement. 3. Hypokalemia: Repleted 4. Anemia: Possibly chronic and stable. 5. Mild transaminitis: No GI symptomsl. Check CK-112. Improving. 6. Right wrist fracture and left knee fracture: Continue right wrist cast and left knee immobilizer. Pain management-controlled. Discussed with Dr. Shon Baton, orthopedics on 5/17-nonweightbearing on left lower extremity. 7. Depression and anxiety/panic attacks: Continue fluoxetine which she takes 60 mg daily and Xanax which she uses infrequently. Stable. 8. Diet control type II DM: 9. Hypothyroidism:  Code Status: Full Family Communication: Discussed with patient Disposition Plan: SNF ? 5/19   Consultants:  None  Procedures:  None  Antibiotics:  None   HPI/Subjective: Pain controlled. Had some generalized itching on 5/17- resolved.  Objective: Filed Vitals:   08/08/12 0448 08/08/12 1400 08/08/12 2023 08/09/12 0556  BP: 152/66 134/55 152/66 140/57  Pulse: 88 88 88 85  Temp: 98.1 F (36.7 C) 98.1 F (36.7 C) 98.6 F (37 C) 98.7 F (37.1 C)  TempSrc: Oral Oral Oral Oral  Resp: 18 18 20 18   Height:      Weight:      SpO2: 100% 93% 93% 95%    Intake/Output Summary (Last 24 hours) at 08/09/12 1208 Last data filed at 08/09/12 0900  Gross per 24 hour  Intake    720 ml  Output      0 ml  Net    720 ml   Filed Weights   08/07/12 2222  Weight: 62 kg (136 lb 11 oz)    Exam:   General exam: Comfortable. Lying supine in bed.  Respiratory system: Clear. No increased work of breathing.  Cardiovascular system: S1 & S2 heard, RRR. No JVD, murmurs, gallops,  clicks or pedal edema. Telemetry shows sinus rhythm- occasional PM spikes.  Gastrointestinal system: Abdomen is nondistended, soft and nontender. Normal bowel sounds heard.  Central nervous system: Alert and oriented. No focal neurological deficits.  Extremities: Right forearm and wrist in a cast- intact and able to wiggle fingers. Left lower extremity in  immobilizer-able to wiggle toes and peripheral pulses felt.   Data Reviewed: Basic Metabolic Panel:  Recent Labs Lab 08/07/12 1840 08/08/12 0512 08/09/12 0515  NA 138 135 135  K 3.4* 3.2* 3.7  CL 100 98 97  CO2 29 27 31   GLUCOSE 133* 107* 121*  BUN 11 8 9   CREATININE 0.52 0.47* 0.49*  CALCIUM 8.9 8.5 8.6   Liver Function Tests:  Recent Labs Lab 08/07/12 1840 08/09/12 0515  AST 139* 38*  ALT 118* 59*  ALKPHOS 148* 119*  BILITOT 0.5 0.5  PROT 6.8 6.6  ALBUMIN 3.1* 2.9*   No results found for this basename: LIPASE, AMYLASE,  in the last 168 hours No results found for this basename: AMMONIA,  in the last 168 hours CBC:  Recent Labs Lab 08/07/12 1840 08/08/12 0512 08/09/12 0515  WBC 7.1 6.7 7.0  NEUTROABS 5.6  --   --   HGB 10.5* 10.0* 9.8*  HCT 32.2* 31.5* 30.7*  MCV 96.7 97.2 97.2  PLT 163 163 176   Cardiac Enzymes:  Recent Labs Lab 08/08/12 0512  CKTOTAL 112   BNP (last 3 results) No results found for this basename: PROBNP,  in the last 8760 hours CBG:  Recent Labs Lab 08/08/12 1131 08/08/12 1712 08/09/12 0029 08/09/12 0555 08/09/12 0718  GLUCAP 142* 184* 122* 124* 118*    No results found for this or any previous visit (from the past 240 hour(s)).   Studies: Ct Head Wo Contrast  08/08/2012   *RADIOLOGY REPORT*  Clinical Data: Recurrent falls  CT HEAD WITHOUT CONTRAST  Technique:  Contiguous axial images were obtained from the base of the skull through the vertex without contrast.  Comparison: 08/07/2012  Findings: The left anterior parafalcine extra-axial hematoma has improved and now it is 10 mm in thickness.  No midline shift.  No evidence of new intra-axial hemorrhage.  Chronic ischemic changes in the periventricular white matter and global atrophy are stable.  IMPRESSION: Slightly improved left anterior parafalcine extra-axial hemorrhage. No new hemorrhage.  Chronic ischemic changes and atrophy are noted.   Original Report Authenticated By:  Jolaine Click, M.D.   Ct Head Wo Contrast  08/07/2012   *RADIOLOGY REPORT*  Clinical Data: Multiple falls.  CT HEAD WITHOUT CONTRAST  Technique:  Contiguous axial images were obtained from the base of the skull through the vertex without contrast.  Comparison: None.  Findings: Left, anterior parafalcine subdural hematoma is identified.  This measures 11 mm in thickness and has mild mass effect upon the left frontal lobe.  There is diffuse patchy low density throughout the subcortical and periventricular white matter consistent with chronic small vessel ischemic change.  There is prominence of the sulci and ventricles consistent with brain atrophy.  The paranasal sinuses are clear.  The mastoid air cells are clear. The skull is intact.  IMPRESSION:  1.  Small vessel ischemic disease and brain atrophy. 2.  There is an acute left anterior parafalcine subdural hematoma measuring 11 mm in thickness.  Critical Value/emergent results were called by telephone at the time of interpretation on 08/07/2012 at 6:48 p.m. to Dr. Judd Lien, who verbally acknowledged these results.   Original Report Authenticated  By: Signa Kell, M.D.     Additional labs:   Scheduled Meds: . amLODipine  10 mg Oral Daily  . FLUoxetine  20 mg Oral TID  . folic acid  1 mg Oral Daily  . gabapentin  300 mg Oral QHS  . levothyroxine  100 mcg Oral Daily  . sodium chloride  3 mL Intravenous Q12H   Continuous Infusions:   Principal Problem:   Knee fracture, left Active Problems:   Right wrist fracture   Recurrent falls   Syncope   SDH (subdural hematoma)   Subdural hematoma   Hypokalemia   Anemia    Time spent: 25 minutes    [facility]  [facility] Pager [phone].   If 8PM-8AM, please contact night-coverage at [email or web address], password [facility] 08/09/2012, 12:08 PM  LOS: 2 days

## 2012-08-10 ENCOUNTER — Observation Stay (HOSPITAL_COMMUNITY): Payer: Medicare Other

## 2012-08-10 LAB — HEPATIC FUNCTION PANEL
ALT: 43 U/L — ABNORMAL HIGH (ref 0–35)
AST: 29 U/L (ref 0–37)
Albumin: 2.7 g/dL — ABNORMAL LOW (ref 3.5–5.2)
Total Protein: 6.3 g/dL (ref 6.0–8.3)

## 2012-08-10 LAB — GLUCOSE, CAPILLARY: Glucose-Capillary: 100 mg/dL — ABNORMAL HIGH (ref 70–99)

## 2012-08-10 MED ORDER — ASPIRIN EC 325 MG PO TBEC
325.0000 mg | DELAYED_RELEASE_TABLET | Freq: Two times a day (BID) | ORAL | Status: DC
  Administered 2012-08-10 – 2012-08-11 (×3): 325 mg via ORAL
  Filled 2012-08-10 (×5): qty 1

## 2012-08-10 MED ORDER — FLUOXETINE HCL 20 MG PO CAPS
60.0000 mg | ORAL_CAPSULE | Freq: Every day | ORAL | Status: DC
  Administered 2012-08-11 – 2012-08-12 (×2): 60 mg via ORAL
  Filled 2012-08-10 (×2): qty 3

## 2012-08-10 MED ORDER — HYDROMORPHONE HCL 2 MG PO TABS
2.0000 mg | ORAL_TABLET | ORAL | Status: DC | PRN
  Administered 2012-08-10: 4 mg via ORAL
  Filled 2012-08-10: qty 2

## 2012-08-10 MED ORDER — OXYCODONE-ACETAMINOPHEN 5-325 MG PO TABS
1.0000 | ORAL_TABLET | ORAL | Status: DC | PRN
  Administered 2012-08-10: 2 via ORAL
  Filled 2012-08-10: qty 2

## 2012-08-10 MED ORDER — FLUOXETINE HCL 20 MG PO CAPS
40.0000 mg | ORAL_CAPSULE | Freq: Once | ORAL | Status: AC
  Administered 2012-08-10: 40 mg via ORAL
  Filled 2012-08-10: qty 2

## 2012-08-10 MED ORDER — MORPHINE SULFATE 15 MG PO TABS
7.5000 mg | ORAL_TABLET | ORAL | Status: DC | PRN
  Administered 2012-08-11 – 2012-08-12 (×7): 7.5 mg via ORAL
  Filled 2012-08-10 (×8): qty 1

## 2012-08-10 NOTE — Evaluation (Signed)
Physical Therapy Evaluation Patient Details Name: Meredith Gonzalez MRN: [id number] DOB: 09-07-1935 Today's Date: 08/10/2012 Time: 1217-1228 PT Time Calculation (min): 11 min  PT Assessment / Plan / Recommendation Clinical Impression  Pt is a 77 year old female admitted for recurrent syncope and falls with recent R distal radial fx with splint and now L tibial plateau fx. Orders for L LE NWB and KI maintained and R wrist splint to cast in 10 days so will remain NWB through R wrist.  Pt crying and upset upon entering room stating she wasn't happy with her insurance as they will not accept her to d/c to SNF.  Pt in the process of moving to ALF in [facility] and states they cannot provide her current level of assist at this time.  Pt educated on NWB L LE and R wrist as well as maintaining KI and shown/demonstrated platform RW as pt concerned about eventually getting OOB.  Pt reported too much pain to perform EOB or OOB however agreeable to use bed mobility for evaluation.  Recommend SNF at this time. If this is not possible, recommend 24/7 assist and HHPT.    PT Assessment  Patient needs continued PT services    Follow Up Recommendations  SNF;Supervision/Assistance - 24 hour    Does the patient have the potential to tolerate intense rehabilitation      Barriers to Discharge        Equipment Recommendations  Rolling walker with 5" wheels;Wheelchair (measurements PT);Wheelchair cushion (measurements PT) (R platform RW)    Recommendations for Other Services     Frequency Min 3X/week    Precautions / Restrictions Precautions Precautions: Fall Required Braces or Orthoses: Knee Immobilizer - Left Restrictions Weight Bearing Restrictions: Yes RUE Weight Bearing: Weight bear through elbow only (not in orders however distal wrist fx with splint) LLE Weight Bearing: Non weight bearing   Pertinent Vitals/Pain Increased pain L LE, RN notified of pt request for pain meds, positioned to comfort in  supine     Mobility  Bed Mobility Bed Mobility: Rolling Left;Scooting to HOB;Rolling Right Rolling Right: 3: Mod assist Rolling Left: 3: Mod assist;With rail Scooting to HOB: 1: +2 Total assist Scooting to [place]: Patient Percentage: 0% Details for Bed Mobility Assistance: assist during rolling for lower body and hips, pt used bed pan, unable to get OOB or sit up EOB due to increased pain Transfers Transfers: Not assessed Ambulation/Gait Ambulation/Gait Assistance: Not tested (comment)    Exercises     PT Diagnosis: Difficulty walking;Acute pain  PT Problem List: Decreased strength;Decreased activity tolerance;Decreased mobility;Decreased knowledge of precautions;Decreased knowledge of use of DME;Pain PT Treatment Interventions: DME instruction;Gait training;Functional mobility training;Therapeutic activities;Therapeutic exercise;Patient/family education;Wheelchair mobility training   PT Goals Acute Rehab PT Goals PT Goal Formulation: With patient Time For Goal Achievement: 08/24/12 Potential to Achieve Goals: Good Pt will go Supine/Side to Sit: with min assist PT Goal: Supine/Side to Sit - Progress: Goal set today Pt will go Sit to Supine/Side: with min assist PT Goal: Sit to Supine/Side - Progress: Goal set today Pt will go Sit to Stand: with min assist PT Goal: Sit to Stand - Progress: Goal set today Pt will go Stand to Sit: with min assist PT Goal: Stand to Sit - Progress: Goal set today Pt will Ambulate: 1 - 15 feet;with mod assist;with rolling walker PT Goal: Ambulate - Progress: Goal set today  Visit Information  Last PT Received On: 08/10/12 Assistance Needed: +2    Subjective Data  Subjective:  They just told me my insurance won't accept SNF.   Prior Functioning  Home Living Additional Comments: Pt was living alone however states her husband recently passed and her son was helping her move into ALF in [facility] however ALF unable to provide current level of care  now per pt Prior Function Level of Independence: Independent with assistive device(s) Communication Communication: No difficulties    Cognition  Cognition Arousal/Alertness: Awake/alert Behavior During Therapy: WFL for tasks assessed/performed Overall Cognitive Status: Within Functional Limits for tasks assessed    Extremity/Trunk Assessment Right Lower Extremity Assessment RLE ROM/Strength/Tone: [facility] for tasks assessed Left Lower Extremity Assessment LLE ROM/Strength/Tone: Unable to fully assess;Due to pain;Due to precautions   Balance    End of Session PT - End of Session Equipment Utilized During Treatment: Left knee immobilizer Activity Tolerance: Patient limited by pain Patient left: in bed;with call bell/phone within reach Nurse Communication: Patient requests pain meds  GP Functional Assessment Tool Used: clinical observation Functional Limitation: Mobility: Walking and moving around Mobility: Walking and Moving Around Current Status (N8295): At least 40 percent but less than 60 percent impaired, limited or restricted Mobility: Walking and Moving Around Goal Status [phone]): At least 20 percent but less than 40 percent impaired, limited or restricted   Johnelle Tafolla,KATHrine E 08/10/2012, 1:12 PM Zenovia Jarred, PT, DPT 08/10/2012 Pager: [phone]

## 2012-08-10 NOTE — Progress Notes (Signed)
CSW met with patient's son and gave bed offers. CSW has not yet heard from [employer].  Garlen Reinig C. Ormand Senn MSW, LCSW [phone]

## 2012-08-10 NOTE — Care Management Note (Addendum)
Page 1 of 2   08/12/2012     5:28:29 PM   CARE MANAGEMENT NOTE 08/12/2012  Patient:  [facility]   Account Number:  [id number]  Date Initiated:  08/10/2012  Documentation initiated by:  Lanier Clam  Subjective/Objective Assessment:   ADMITTED W/FALL.R WRIST FX,& L KNEE FX.     Action/Plan:   FROM OUT OF TOWN,VISITING FAMILY.HAS SON WHO WORKS FULL TIME.   Anticipated DC Date:  08/12/2012   Anticipated DC Plan:  SKILLED NURSING FACILITY      DC Planning Services  CM consult      Choice offered to / List presented to:  C-1 Patient           Status of service:  Completed, signed off Medicare Important Message given?  NA - LOS <3 / Initial given by admissions (If response is "NO", the following Medicare IM given date fields will be blank) Date Medicare IM given:   Date Additional Medicare IM given:    Discharge Disposition:  SKILLED NURSING FACILITY  Per UR Regulation:  Reviewed for med. necessity/level of care/duration of stay  If discussed at Long Length of Stay Meetings, dates discussed:    Comments:  08/12/12 4P Raiden Yearwood RN,BSN NCM [phone] LISTENED TO MESSAGE ON DESK PHONE FROM PATIENT'S SON-THAT I COME UP TO RM TO DISCUSS D/C PLAN W/HIS WIFE WHO IS WAITING ON ME.I WENT TO THE FLOOR & INFORMED THE DIRECTO MARY ANN AMOS ABOUT THE ENTIRE CASE FROM BEGINNING TO END,EXPLAINED THAT EVERY ASPECT OF OUR PROCESS OF UTILIZATION REVIEW,& D/C PLANNING HAS BEEN APPLIED,& ALL CHAINS OF COMMAND HAS BEEN UPDATED,& MADE AWARE OF THE PROCESS SO THAT THE HOSPITAL DID NOT LEAVE ANY PROCEDURE OR PROCESS OUT WITHIN THE GUIDELINES OF THE HOSPITAL,& ABIDING ALL THE RULES,& REGULATIONS.I WAS ASKED TO DISCUSS THE ISSUES WITH THE SPOUSE OF THE PATIENT'S SON.SINCE I HAD ALREADY DISCUSSED THE CASE IN LENGTH TO THE SON FOR THE PAST 2 DAYS WE FELT IT NOT NECESSARY TO ONCE AGAIN INFORM THE SPOUSE OF THE SON,THEREFORE THE DIRECTOR OF THE FLOOR INFORMED THE SPOUSE OF THE SON THAT ALL INFO HAD ALREADY  BEEN GIVEN TO PATIENT'S SON.PATIENT LEFT THE BUILDING ESCORTED BY STAFF.   08/12/12 11:45P Ercil Cassis RN,BSN NCM [phone] RECEIVED CALL FROM ED CSW KRISTEN TEL#[phone] ABOUT QUESTION FROM PATIENT'S SON KEVIN ON APPEALING THE D/C.I CALLED KEVIN @ TEL# [phone], TO EXPLAIN THAT YOU CANNOT APPEAL THE D/C TO MEDICARE SINCE PATIENT WAS OBSERVATION.HE STARTED YELLING THAT THE SKILLED FACILITY TOLD HIM THAT HE COULD,THEN HE PROCEEDED TO GO BACK TO THE BEGINNING OF THE ENTIRE SYNOPSIS FROM  ADMISSION TO PRESENT,STORY AGAIN.THEN HE TELLS ME THAT I AM BEING RECORDED.I NICELY TOLD HIM THAT SINCE HE DID NOT LET ME KNOW THAT I WAS BEING RECORDED @ THE BEGINNING OF THE CONVERSATION THAT MY COMMENTS ARE NOT ADMISSABLE. I THEN SAID I MUST HANG UP NOW. I CONTACTED THE ED CSW & UPDATED HER ON THE OUTCOME.CURRENT D/C PLAN ED CSW WORKING ON SNF PRIVATE PAY.  08/11/12 -4:30P Marshayla Mitschke RN,BSN NCM [phone] RECEIVED CALL FROM SON ABOUT D/C PLANS.HE STATES THAT HE WAS @ WORK & COULDN'T TALK SO HE FURTHER EXPLAINED HIS FRUSTRATION THAT HOW COULD SHE BE INPATIENT,& THEN CHANGED TO OBSERVATION,THEN CHANGED TO INPATIENT AGAIN.I EXPLAINED THAT THE CASE IS REVIEWED ON A DAILY BASIS,& THROUGH OUR REVIEW PROCESS IT WAS DETERMINED THAT SHE NEVER HAD A QUALIFYING STAY.HE IS UPSET ABOUT THIS SINCE SHE CANNOT AFFORD TO PAY  PRIVATELY.HE ASKED FOR A TEL# TO CONTACT TO TAKE THIS FURTHER I GAVE HIM THE EXECUTIVE OFFICE TEL# [phone](SYLVIA),THEN HE HUNG UP THE PHONE.RECEIVED CALL FROM MD TO PROVIDE PCP LISTING TO PATIENT.PROVIDED PATIENT PCP LIST.SHE WAS APPRECIATIVE,& SAID SHE UNDERSTANDS THAT IT IS NOT ME DOING THIS BUT SHE DOESN'T LIKE THE PROCESS.PATIENT WAS ALSO INFORMED OF THE D/C PLAN,& SHE WILL HAVE HER SON CHOOSE A HOME CARE AGENCY.STAFF NURSE CALLED PATIENT'S SON WITH PATIENT'S PERMISSION,& INFORMED HIM OF CHOOSING A HHC AGENCY HE STATED THAT PATIENT IS NOT COMING TO HIS HOME HE HAS TO FIGURE OUT WHERE SHE IS GOING, & THEN  CHOOSE A HHC AGENCY. 08/11/12- 2:30P Parley Pidcock RN,BSN NCM [phone] PER MD HE CALLED PATIENT'S SON KEVIN TO INFORM OF D/C TODAY,SON SAYS HE IS @ WORK & UNABLE TO COME TO HOSPITAL NOW.MD PUTTING IN HH/DME ORDERS.I HAVE CONTACTED PATIENT'S SUPPLEMENT INSURANCE [employer] REP ERICA TEL#[phone] TO SEE IF THERE ARE ANY ADDITIONAL SERVICES THEY CAN OFFER BUT USUALLY ONLY IF THEY ARE PRIMARY THEY HAVE SERVICES TO OFFER.  08/11/12 Sumaya Riedesel RN,BSN NCM [phone] RECEIVED CALL FROM PATIENT'S SON KEVIN [place] ABOUT WHAT IS THE D/C PLANS.ATTEMPTED TO EXPLAIN THAT PATIENT DID NOT QUALIFY FOR A 3DAY INPATIENT HOSPITAL STAY EVEN THOUGH THE INITIAL ORDER WAS INPATIENT,WE REVIEW THE CASES ON A DAILY BASIS,& PATIENT NEVER HAD A 3 DAY CONSECUTIVE QUALIFYING STAY.SHE NEEDED REHAB.RECOMMENDED EITHER HH/PRIVATE PAY FOR SNF-HE SAYS HE CANNOT PAY PRIVATELY.KEVIN CONTINUED TO SAY THAT I TOLD HIM EVERYTHING WAS GOING TO WORK OUT.HE SAYS HE WILL GO TO THE NEWS ABOUT THIS.CURRENTLY HE IS @ WORK & CANNOT COME TO HOSPITAL NOW.HE DID PROVIDE ME WITH HIS TEL# [phone].I HAVE CONTACTED MY SUPV,MED ADVISOR,MD,CSW,NURSING STAFF,RISK MGMNT,EVEN EXECUTIVE OFFICES OF THE CURRENT STATUS.  08/10/12 Ismaeel Arvelo RN,BSN NCM [phone] MEDICARE IS PRIMARY.PT-SNF.CSW FOLLOWING.PROVIDED MEDICARE OUTPATIENT/OBSERVATION SHEET TO PATIENT'S SON BUT HE DECLINES TO SIGN.PT/OT-SNF.SON STATES HE IS UNABLE TO PAY PRIVATELY FOR SNF. SPOKE TO PATIENT/SON ABOUT D/C PLANS.SON SAYS HE HAS STEPS,& UNABLE TO CARE FOR PATIENT@ HOME.PROVIDED W/HHC AGENCY LIST,PRIVATE SITTER LIST,EXPLAINED THE INSURANCE COVERAGE, SINCE MEDICARE IS THE PRIMARY.MD UPDATED.

## 2012-08-10 NOTE — Consult Note (Signed)
Reason for Consult:Left tibial plateau Fracture and right distal radius fracture. Referring Physician: Lyda Perone DO  Meredith Gonzalez is an 77 y.o. female.  HPI: Orthopaedics was consulted for recommendations for current fracture care. We appreciate the consult. Patient suffered from a fall mid last week. [facility] and splinting/immobilization was done. She was then transferred to [facility] due to her son living locally. They had made arrangements for her move to a independent living community prior to her fall. When she fell she fractured her right distal radius and left tibial plateau.  She currently is in good spirits and pain is well controlled. The LE brace does seem to irritate her and we will add cushioning to it for comfort. Patient is tolerating PO's well.  Denies any vomiting or nausea. Denies any CP, SOB, or calf pain. Patient needs further skilled care at discharge for ADL's and care for these fractures.  Past Medical History  Diagnosis Date  . Hypothyroidism   . Hyperthyroidism   . Depression   . Diabetes mellitus without complication   . Pacemaker   . Stroke 2000  . Headache   . Cancer     basal cell skin cancers  . Arthritis   . Anemia   . Incontinence of bowel   . Incontinence of urine     Past Surgical History  Procedure Laterality Date  . Pacemaker insertion    . Basal cell cancer removal    . Abdominal hysterectomy    . Cholecystectomy    . Femur fracture surgery      bilateral  . Right ankle surgery    . Carpal tunnel release      bilateral  . Gastric bypass    . Eye surgery      right eye catarace/lens implant  . Colonoscopy      Family History  Problem Relation Age of Onset  . Congestive Heart Failure Mother   . Heart attack Father   . Alcoholism Father   . Alcoholism Sister   . Alcoholism Brother   . Alcoholism Brother   . Throat cancer Brother     Social History:  reports that she quit smoking about 40  years ago. Her smoking use included Cigarettes. She has a 2 pack-year smoking history. She has never used smokeless tobacco. She reports that  drinks alcohol. She reports that she does not use illicit drugs.  Allergies:  Allergies  Allergen Reactions  . Valium (Diazepam)     Makes patient hyper  . Codeine Hives and Itching    Medications: I have reviewed the patient's current medications.  Results for orders placed during the hospital encounter of 08/07/12 (from the past 48 hour(s))  GLUCOSE, CAPILLARY     Status: Abnormal   Collection Time    08/08/12 11:31 AM      Result Value Range   Glucose-Capillary 142 (*) 70 - 99 mg/dL   Comment 1 Notify RN    GLUCOSE, CAPILLARY     Status: Abnormal   Collection Time    08/08/12  5:12 PM      Result Value Range   Glucose-Capillary 184 (*) 70 - 99 mg/dL   Comment 1 Notify RN    GLUCOSE, CAPILLARY     Status: Abnormal   Collection Time    08/09/12 12:29 AM      Result Value Range   Glucose-Capillary 122 (*) 70 - 99 mg/dL  COMPREHENSIVE METABOLIC PANEL  Status: Abnormal   Collection Time    08/09/12  5:15 AM      Result Value Range   Sodium 135  135 - 145 mEq/Gonzalez   Potassium 3.7  3.5 - 5.1 mEq/Gonzalez   Chloride 97  96 - 112 mEq/Gonzalez   CO2 31  19 - 32 mEq/Gonzalez   Glucose, Bld 121 (*) 70 - 99 mg/dL   BUN 9  6 - 23 mg/dL   Creatinine, Ser 1.61 (*) 0.50 - 1.10 mg/dL   Calcium 8.6  8.4 - 09.6 mg/dL   Total Protein 6.6  6.0 - 8.3 g/dL   Albumin 2.9 (*) 3.5 - 5.2 g/dL   AST 38 (*) 0 - 37 U/Gonzalez   ALT 59 (*) 0 - 35 U/Gonzalez   Alkaline Phosphatase 119 (*) 39 - 117 U/Gonzalez   Total Bilirubin 0.5  0.3 - 1.2 mg/dL   GFR calc non Af Amer >90  >90 mL/min   GFR calc Af Amer >90  >90 mL/min   Comment:            The eGFR has been calculated     using the CKD EPI equation.     This calculation has not been     validated in all clinical     situations.     eGFR's persistently     <90 mL/min signify     possible Chronic Kidney Disease.  CBC     Status: Abnormal    Collection Time    08/09/12  5:15 AM      Result Value Range   WBC 7.0  4.0 - 10.5 K/uL   RBC 3.16 (*) 3.87 - 5.11 MIL/uL   Hemoglobin 9.8 (*) 12.0 - 15.0 g/dL   HCT 04.5 (*) 40.9 - 81.1 %   MCV 97.2  78.0 - 100.0 fL   MCH 31.0  26.0 - 34.0 pg   MCHC 31.9  30.0 - 36.0 g/dL   RDW 91.4  78.2 - 95.6 %   Platelets 176  150 - 400 K/uL  GLUCOSE, CAPILLARY     Status: Abnormal   Collection Time    08/09/12  5:55 AM      Result Value Range   Glucose-Capillary 124 (*) 70 - 99 mg/dL   Comment 1 Notify RN    GLUCOSE, CAPILLARY     Status: Abnormal   Collection Time    08/09/12  7:18 AM      Result Value Range   Glucose-Capillary 118 (*) 70 - 99 mg/dL   Comment 1 Notify RN    GLUCOSE, CAPILLARY     Status: Abnormal   Collection Time    08/09/12 11:40 AM      Result Value Range   Glucose-Capillary 135 (*) 70 - 99 mg/dL   Comment 1 Notify RN    GLUCOSE, CAPILLARY     Status: Abnormal   Collection Time    08/09/12  5:24 PM      Result Value Range   Glucose-Capillary 126 (*) 70 - 99 mg/dL   Comment 1 Notify RN    GLUCOSE, CAPILLARY     Status: Abnormal   Collection Time    08/09/12  9:25 PM      Result Value Range   Glucose-Capillary 119 (*) 70 - 99 mg/dL   Comment 1 Notify RN    HEPATIC FUNCTION PANEL     Status: Abnormal   Collection Time    08/10/12  4:52 AM  Result Value Range   Total Protein 6.3  6.0 - 8.3 g/dL   Albumin 2.7 (*) 3.5 - 5.2 g/dL   AST 29  0 - 37 U/Gonzalez   ALT 43 (*) 0 - 35 U/Gonzalez   Alkaline Phosphatase 118 (*) 39 - 117 U/Gonzalez   Total Bilirubin 0.4  0.3 - 1.2 mg/dL   Bilirubin, Direct 0.1  0.0 - 0.3 mg/dL   Indirect Bilirubin 0.3  0.3 - 0.9 mg/dL  GLUCOSE, CAPILLARY     Status: Abnormal   Collection Time    08/10/12  5:27 AM      Result Value Range   Glucose-Capillary 100 (*) 70 - 99 mg/dL   Comment 1 Notify RN      Ct Head Wo Contrast  08/08/2012   *RADIOLOGY REPORT*  Clinical Data: Recurrent falls  CT HEAD WITHOUT CONTRAST  Technique:  Contiguous axial  images were obtained from the base of the skull through the vertex without contrast.  Comparison: 08/07/2012  Findings: The left anterior parafalcine extra-axial hematoma has improved and now it is 10 mm in thickness.  No midline shift.  No evidence of new intra-axial hemorrhage.  Chronic ischemic changes in the periventricular white matter and global atrophy are stable.  IMPRESSION: Slightly improved left anterior parafalcine extra-axial hemorrhage. No new hemorrhage.  Chronic ischemic changes and atrophy are noted.   Original Report Authenticated By: Jolaine Click, M.D.    Review of Systems  Constitutional: Negative.   HENT: Negative.   Eyes: Negative.   Respiratory: Negative.   Cardiovascular: Negative.   Gastrointestinal: Negative.   Genitourinary: Negative.   Musculoskeletal: Positive for joint pain and falls.  Skin:       Ecchymosis to left LE and Right UE  Neurological: Negative.   Endo/Heme/Allergies: Negative.   Psychiatric/Behavioral: Negative.    Blood pressure 127/57, pulse 74, temperature 98.6 F (37 C), temperature source Oral, resp. rate 20, height 5\' 8"  (1.727 m), weight 61.5 kg (135 lb 9.3 oz), SpO2 97.00%. Physical Exam  Constitutional: She is oriented to person, place, and time. She appears well-nourished.  HENT:  Head: Normocephalic.  Eyes: Pupils are equal, round, and reactive to light.  Neck: Normal range of motion.  Cardiovascular: Normal rate and regular rhythm.   Respiratory: Effort normal and breath sounds normal.  GI: Soft. Bowel sounds are normal.  Genitourinary:  Deffered  Musculoskeletal:  Limited due to right wrist splint  And left knee immobilizer.   Neurological: She is alert and oriented to person, place, and time.  Skin: Skin is warm and dry.  Psychiatric: Her behavior is normal.    Assessment/Plan:Left tibial plateau Fracture and right distal radius fracture.  Continue Current care. Remain in bracing at this time. Will f/u in Dr. Thomasena Edis  office in 10 days to change her wrist splint to a hard cast. Non weightbearing with left LE. Use assistive devices as necessary.  Discharge to SNF when medically stable. Consider Asprin 325mg  BID for DVT prophylaxis if tolerated.   Meredith Gonzalez 08/10/2012, 7:12 AM

## 2012-08-10 NOTE — Progress Notes (Signed)
[facility] PROGRESS NOTE  Meredith Gonzalez ZOX:[id number] DOB: 08/13/1935 DOA: 08/07/2012 PCP: No primary provider on file.  Brief narrative 77 year old female patient, resident of [place], [place]  gets all her care in [place] or [place], [place], has PMH of hypothyroidism, depression, DM, pacemaker, recurrent syncopal episodes for the last 2 years who has been extensively evaluated (CT head, EEG, 2-D echo and seven-day heart monitor). She is in the process of moving to [facility]. She sustained a right wrist fracture in her hometown requiring a cast. While driving to the [place] area, she made a rest stop and at that time had a syncopal event, fell, hit her head and sustained left knee fracture. She was admitted to an outside facility. After discharge, her son picked her up and she was seen by orthopedic M.D. who apparently recommended nonweightbearing for 10 weeks. Obviously patient has difficulty ambulating and her son is unable to take care of her and is looking for SNF placement. She was brought to the ED on 08/07/12. CT head showed subdural hematoma-findings were discussed by EDP with Dr. Venetia Maxon of neurosurgery who recommended repeat CT this morning but no other intervention. Patient was admitted for further evaluation and management.  Assessment/Plan: 1. Recurrent syncope and falls: Etiology not clear. Per patient, she has been extensively evaluated by her PCP, cardiology and neurology in her hometown. She recently had a heart monitor and was told that she has A. fib and was supposed to start a blood thinner. Requested medical records from her primary physician's-not available yet. Continue to monitor on telemetry. 2-D echo: LVEF 55-60%, grade 1 diastolic dysfunction and PA pressure 53 mmHg. Repeat head CT shows improvement. DC tramadol given potential interaction with Prozac. No clavicular fracture on x-ray but ? Right humeral head fracture of indeterminate age, outpatient followup  with orthopedics. 2. Subdural hematoma: Secondary to fall and head injury. Patient alert and oriented. Repeat head CT 5/17 shows improvement. 3. Hypokalemia: Repleted 4. Anemia: Possibly chronic and stable. 5. Mild transaminitis: No GI symptomsl. Check CK-112. Improving. 6. Right wrist fracture and left knee fracture: Continue right wrist cast and left knee immobilizer. Pain management-controlled. Orthopedic followup appreciated-OP follow up in 10 days to change right wrist cast. Nonweightbearing on left lower extremity. 7. Depression and anxiety/panic attacks: Continue fluoxetine which she takes 60 mg daily and Xanax which she uses infrequently. Stable. 8. Diet control type II DM: 9. Hypothyroidism: 10. PAF: Rate controlled. Not a candidate for anticoagulation secondary to recurrent falls and current subdural hematoma. May need reevaluation in 2 weeks to consider aspirin. Outpatient followup with cardiology.  Code Status: Full Family Communication: Discussed with patient and son Mr. Jaylyne Breese at bedside. Disposition Plan: Medically stable for discharge to SNF when bed available.   Consultants:  Orthopedics  Procedures:  None  Antibiotics:  None   HPI/Subjective: Patient complained of pain of right collar bone. Pain in right wrist and left leg. Some pruritus with Percocet. Had 2 BMs yesterday.  Objective: Filed Vitals:   08/09/12 0556 08/09/12 1354 08/09/12 2126 08/10/12 0549  BP: 140/57 116/47 145/56 127/57  Pulse: 85 79 88 74  Temp: 98.7 F (37.1 C) 98.5 F (36.9 C) 99.5 F (37.5 C) 98.6 F (37 C)  TempSrc: Oral Oral Oral Oral  Resp: 18 18 18 20   Height:      Weight:    61.5 kg (135 lb 9.3 oz)  SpO2: 95% 97% 93% 97%    Intake/Output Summary (Last 24 hours) at  08/10/12 1201 Last data filed at 08/10/12 0800  Gross per 24 hour  Intake   1210 ml  Output      0 ml  Net   1210 ml   Filed Weights   08/07/12 2222 08/10/12 0549  Weight: 62 kg (136 lb 11 oz) 61.5 kg  (135 lb 9.3 oz)    Exam:   General exam: Comfortable. Lying supine in bed.  Respiratory system: Clear. No increased work of breathing.  Cardiovascular system: S1 & S2 heard, RRR. No JVD, murmurs, gallops, clicks or pedal edema. Telemetry shows sinus rhythm- occasional nonsustained runs of A. fib.  Gastrointestinal system: Abdomen is nondistended, soft and nontender. Normal bowel sounds heard.  Central nervous system: Alert and oriented. No focal neurological deficits.  Extremities: Right forearm and wrist in a cast- intact and able to wiggle fingers. Left lower extremity in immobilizer-able to wiggle toes and peripheral pulses felt.   Data Reviewed: Basic Metabolic Panel:  Recent Labs Lab 08/07/12 1840 08/08/12 0512 08/09/12 0515  NA 138 135 135  K 3.4* 3.2* 3.7  CL 100 98 97  CO2 29 27 31   GLUCOSE 133* 107* 121*  BUN 11 8 9   CREATININE 0.52 0.47* 0.49*  CALCIUM 8.9 8.5 8.6   Liver Function Tests:  Recent Labs Lab 08/07/12 1840 08/09/12 0515 08/10/12 0452  AST 139* 38* 29  ALT 118* 59* 43*  ALKPHOS 148* 119* 118*  BILITOT 0.5 0.5 0.4  PROT 6.8 6.6 6.3  ALBUMIN 3.1* 2.9* 2.7*   No results found for this basename: LIPASE, AMYLASE,  in the last 168 hours No results found for this basename: AMMONIA,  in the last 168 hours CBC:  Recent Labs Lab 08/07/12 1840 08/08/12 0512 08/09/12 0515  WBC 7.1 6.7 7.0  NEUTROABS 5.6  --   --   HGB 10.5* 10.0* 9.8*  HCT 32.2* 31.5* 30.7*  MCV 96.7 97.2 97.2  PLT 163 163 176   Cardiac Enzymes:  Recent Labs Lab 08/08/12 0512  CKTOTAL 112   BNP (last 3 results) No results found for this basename: PROBNP,  in the last 8760 hours CBG:  Recent Labs Lab 08/09/12 1724 08/09/12 2125 08/10/12 0527 08/10/12 0733 08/10/12 1137  GLUCAP 126* 119* 100* 115* 106*    No results found for this or any previous visit (from the past 240 hour(s)).   Studies: Dg Clavicle Right  08/10/2012   *RADIOLOGY REPORT*  Clinical  Data: Fall, clavicle pain  RIGHT CLAVICLE - 2+ VIEWS  Comparison: None.  Findings: Two views  right clavicle submitted.  No acute fracture or subluxation.  Diffuse osteopenia.  Degenerative changes are noted right AC joint.  There is deformity of the right humeral head probable due to fracture of indeterminate age.  Clinical correlation is necessary.  IMPRESSION: No acute fracture or subluxation.  Diffuse osteopenia. Degenerative changes are noted right AC joint.  There is deformity of the right humeral head probable due to fracture of indeterminate age.  Clinical correlation is necessary.   Original Report Authenticated By: Natasha Mead, M.D.     Additional labs:   Scheduled Meds: . amLODipine  10 mg Oral Daily  . aspirin EC  325 mg Oral BID  . FLUoxetine  20 mg Oral TID  . folic acid  1 mg Oral Daily  . gabapentin  300 mg Oral QHS  . levothyroxine  100 mcg Oral Daily  . sodium chloride  3 mL Intravenous Q12H   Continuous Infusions:  Principal Problem:   Knee fracture, left Active Problems:   Right wrist fracture   Recurrent falls   Syncope   SDH (subdural hematoma)   Subdural hematoma   Hypokalemia   Anemia    Time spent: 25 minutes    [facility]  [facility] Pager [phone].   If 8PM-8AM, please contact night-coverage at [email or web address], password [facility] 08/10/2012, 12:01 PM  LOS: 3 days

## 2012-08-11 DIAGNOSIS — E876 Hypokalemia: Secondary | ICD-10-CM

## 2012-08-11 DIAGNOSIS — I6200 Nontraumatic subdural hemorrhage, unspecified: Secondary | ICD-10-CM

## 2012-08-11 DIAGNOSIS — Z9181 History of falling: Secondary | ICD-10-CM

## 2012-08-11 DIAGNOSIS — R55 Syncope and collapse: Secondary | ICD-10-CM

## 2012-08-11 MED ORDER — ZOLPIDEM TARTRATE 10 MG PO TABS: 5.0000 mg | ORAL_TABLET | Freq: Every evening | ORAL | Status: DC | PRN

## 2012-08-11 MED ORDER — ALPRAZOLAM 0.25 MG PO TABS: 0.2500 mg | ORAL_TABLET | Freq: Two times a day (BID) | ORAL | Status: DC | PRN

## 2012-08-11 MED ORDER — MORPHINE SULFATE 15 MG PO TABS: 7.5000 mg | ORAL_TABLET | ORAL | Status: DC | PRN

## 2012-08-11 MED ORDER — ASPIRIN 325 MG PO TBEC: 325.0000 mg | DELAYED_RELEASE_TABLET | Freq: Two times a day (BID) | ORAL | Status: DC

## 2012-08-11 NOTE — Evaluation (Signed)
Occupational Therapy Evaluation Patient Details Name: Meredith Gonzalez MRN: [id number] DOB: July 06, 1935 Today's Date: 08/11/2012 Time: 4098-1191    OT Assessment / Plan / Recommendation Clinical Impression  Pt presents to OT with decreased I with ADL activity s/p fall in which pt now NWB RUE and RLE.  Pt will need post acute rehab in order to regain I with ADL activity and move into ALF.    OT Assessment  Patient needs continued OT Services    Follow Up Recommendations  SNF       Equipment Recommendations  None recommended by OT       Frequency  Min 3X/week    Precautions / Restrictions Precautions Precautions: Fall Required Braces or Orthoses: Knee Immobilizer - Left Restrictions Weight Bearing Restrictions: Yes RUE Weight Bearing: Weight bear through elbow only (not in orders however distal wrist fx with splint) LLE Weight Bearing: Non weight bearing       ADL  Grooming: Minimal assistance Where Assessed - Grooming: Supine, head of bed up Upper Body Bathing: Moderate assistance Where Assessed - Upper Body Bathing: Supine, head of bed up Lower Body Bathing: +1 Total assistance Where Assessed - Lower Body Bathing: Supine, head of bed up Upper Body Dressing: Moderate assistance Where Assessed - Upper Body Dressing: Supine, head of bed up Lower Body Dressing: Maximal assistance Where Assessed - Lower Body Dressing: Supine, head of bed up Transfers/Ambulation Related to ADLs: OT eval focused on edema R fingers including retrograde massage, as well as finger ROM, elbow ROM and shoulder ROM    OT Diagnosis: Generalized weakness;Acute pain  OT Problem List: Decreased strength;Decreased range of motion;Decreased activity tolerance;Impaired balance (sitting and/or standing);Impaired UE functional use OT Treatment Interventions: Self-care/ADL training;Therapeutic exercise;Patient/family education;DME and/or AE instruction;Therapeutic activities   OT Goals Acute Rehab OT  Goals Time For Goal Achievement: 08/25/12 Potential to Achieve Goals: Good ADL Goals Pt Will Perform Grooming: with supervision;Standing at sink ADL Goal: Grooming - Progress: Goal set today Pt Will Perform Upper Body Dressing: with supervision;Sit to stand from chair ADL Goal: Upper Body Dressing - Progress: Goal set today Pt Will Perform Lower Body Dressing: with supervision;Sit to stand from chair ADL Goal: Lower Body Dressing - Progress: Goal set today Pt Will Transfer to Toilet: with supervision;Comfort height toilet;Maintaining weight bearing status ADL Goal: Toilet Transfer - Progress: Goal set today Arm Goals Additional Arm Goal #1: Pt will keep RUE elevated Ily to decrease and prevent further edema. Arm Goal: Additional Goal #1 - Progress: Goal set today Additional Arm Goal #2: Pt will perform AROM R fingers, elbow and shoulder to maintain joint mobility and integrity in preparation for increased WB to perform ADL activity. Arm Goal: Additional Goal #2 - Progress: Goal set today  Visit Information  Last OT Received On: 08/11/12    Subjective Data  Subjective: My husband died 2 monthes ago. I am very frustrated with [employer].    Prior Functioning     Home Living Available Help at Discharge: Other (Comment) (plans to go to SNF for rehab prior to ALF) Additional Comments: Pt was living alone however states her husband recently passed and her son was helping her move into ALF in [facility] however ALF unable to provide current level of care now per pt Prior Function Level of Independence: Independent with assistive device(s) Communication Communication: No difficulties         Vision/Perception Vision - History Baseline Vision: Wears glasses all the time   Cognition  Cognition Arousal/Alertness: Awake/alert Behavior  During Therapy: WFL for tasks assessed/performed Overall Cognitive Status: Within Functional Limits for tasks assessed    Extremity/Trunk Assessment  Right Upper Extremity Assessment RUE ROM/Strength/Tone: Due to precautions (encouraged finger ROM, elbow ROM and shoulder ROM) Left Upper Extremity Assessment LUE ROM/Strength/Tone: [facility] for tasks assessed        Exercise Shoulder Exercises Shoulder Flexion: AROM;Right;5 reps;Other (comment) (0- 90 degrees) Elbow Flexion: AROM;Right;10 reps Elbow Extension: AROM;Right;10 reps Digit Composite Flexion: AROM;Right;10 reps Composite Extension: AROM;Right;10 reps      End of Session OT - End of Session Activity Tolerance: Patient tolerated treatment well Patient left: in bed  GO     Ernestine Rohman, [place]ung 08/11/2012, 9:59 AM

## 2012-08-11 NOTE — Progress Notes (Signed)
CSW received call from RN regarding patient son having questions about ambulance transportation home. Per chart review, patient was admitted however did not have a 3 night consecutive qualifying stay. Patient was recommended for skilled nursing, however pt and pt son stated they are not able to afford skilled nursing care. Per chart review, patient was offered home health agencies. Patient is now medically stable for discharge and has a discharge order. CSW spoke with pt and pt son who state that patient can not return home with patient son as there are stairs, and patient can not afford skilled nursing placement out of pocket. CSW and pt son discussed available options at this time. Patient son was very upset stating that communication had been terrible and that yesterday was told that patient had a qualifying inpatient stay and that staff would be working on snf placement. Pt son stated that he was given the observation status letter, however was told not to sign it by rn cm as it was going to be worked out. Per chart review, pt refused to sign. Per chart review patient and patient son were informed this morning that unfortunately patient admission status did not qualify patient for skilled nursing coverage by medicare and was offered home health or skilled nursing private pay. Patient stated that he did talk with the rn cm and the Chiropodist of social work, and states that he is still upset with the communication and process. CSW provided supportive listening and discussed with patient again the options available. At this time, patient son is requesting skilled nursing private pay. CSW unable to reach admission at [facility] at this time. CSW spoke with [facility] who stated private stay would be approximately $220 per day and could not accept an admission this late in the vening. CSW informed pt son that this Clinical research associate will work on Skilled nursing options private pay for patient and will  follow up in the am.  Patient son will also be considering home health options and discussing with a friend regarding home care services.   Catha Gosselin, LCSWA  [phone] .08/11/2012 1926pm

## 2012-08-11 NOTE — Progress Notes (Signed)
Pt son called to ask if patient would be charged if patient stayed the night. CSW consult RN CM who stated that patient may be billed by medicare since patient has been discharged. Patient asked how much a night in the hosptial may cost, csw stated that [place] was [place]900 depending upon care as this writer has been told in the past. Pt son stated that he was still trying to decide upon bring pt home with him, his wife, and daughter or if patient would stay for skilled nursing in the morning private pay.  Pt went to update patient, patient stated pt son was on the phone and did not want to tell him that csw was in the room. Patient stated that pt son was stated snf, however pt son would still be calling csw back. CSW informed pt RN that csw was still awaiting call from pt son and would call rn when there was a decision made. Per dsicussion with RN CM, a RN CM will follow up win the AM with the chosen agency if patient decides to go home with pt tonight. If patient remains in hospital this writer and a rn cm will follow up in the am.   .Catha Gosselin, LCSWA  [phone] 08/11/2012 1902pm

## 2012-08-11 NOTE — Progress Notes (Signed)
Late entry for yesterday and today. When working with Tax adviser and meeting with patient's son, it appears that patient has traditional medicare and [employer] as a supplement. CSW explained insurance issue to patient's son and trying to figure out insurance. While working on this, patient's son called via Diplomatic Services operational officer and said he had an appointment to go to. CSW told him to go as this would not be figured out today. This morning, CSW consulted Dr. Geoffry Paradise, medical director and he confirms that patient being traditional medicare and was observation status and patient does not have three night qualifying stay. Discussed with case manager, Olegario Messier, she has spoken with patient's son and explained same. If patient would like to private pay for snf, CSW is available for this option.  Dontavia Brand C. Mclane Arora MSW, LCSW [phone]

## 2012-08-11 NOTE — Progress Notes (Signed)
Pt son also stated during conversation that he was not upset with this Clinical research associate and was venting, however was disgusting with care at [facility]. CSW received call back from son who stated that patient and patient son are choosing skilled nursing facility. Pt son stated that pt will remain in the hospital tonight. CSW will follow up with pt and pt son in the am regarding snf placement with anticipated discharge tomorrow.   Catha Gosselin, LCSWA  [phone] 08/11/2012 2006pm

## 2012-08-11 NOTE — Progress Notes (Signed)
[facility] PROGRESS NOTE  Meredith Gonzalez ZOX:[id number] DOB: 03-08-1936 DOA: 08/07/2012 PCP: No primary provider on file.  Brief narrative 77 year old female patient, resident of [place], [place]  gets all her care in [place] or [place], [place], has PMH of hypothyroidism, depression, DM, pacemaker, recurrent syncopal episodes for the last 2 years who has been extensively evaluated (CT head, EEG, 2-D echo and seven-day heart monitor). She is in the process of moving to [facility]. She sustained a right wrist fracture in her hometown requiring a cast. While driving to the [place] area, she made a rest stop and at that time had a syncopal event, fell, hit her head and sustained left knee fracture. She was admitted to an outside facility. After discharge, her son picked her up and she was seen by orthopedic M.D. who apparently recommended nonweightbearing for 10 weeks. Obviously patient has difficulty ambulating and her son is unable to take care of her and is looking for SNF placement. She was brought to the ED on 08/07/12. CT head showed subdural hematoma-findings were discussed by EDP with Dr. Venetia Maxon of neurosurgery who recommended repeat CT this morning but no other intervention. Patient was admitted for further evaluation and management.  Assessment/Plan: 1. Recurrent syncope and falls: Etiology not clear. She has been extensively evaluated by her PCP, cardiology and neurology in her hometown and has been referred to [facility] for further evaluation. She recently had a heart monitor and was told that she has A. fib and was supposed to start a blood thinner. Reviewed records from outside hospital/MD's. Continue to monitor on telemetry. 2-D echo: LVEF 55-60%, grade 1 diastolic dysfunction and PA pressure 53 mmHg. Repeat head CT shows improvement. DC tramadol given potential interaction with Prozac. No clavicular fracture on x-ray but ? Right humeral head fracture of indeterminate age, outpatient  followup with orthopedics.  2. Subdural hematoma: Secondary to fall and head injury. Patient alert and oriented. Repeat head CT 5/17 shows improvement. Discussed with Dr. Venetia Maxon, Neurosurgery- glad to follow her as OP with repeat CT Head in 3-4 weeks. He also advised avoiding ASA (started by Ortho for DVT prophylaxis) for 1-2 weeks and then may consider low dose. ASA DCed 3. Hypokalemia: Repleted 4. Anemia: Possibly chronic and stable. 5. Mild transaminitis: No GI symptomsl. Check CK-112.Continue to improve. OP follow up. 6. Right wrist fracture and left knee fracture: Continue right wrist cast and left knee immobilizer. Pain management- now controlled on MSIR. Orthopedic followup appreciated-OP follow up in 10 days to change right wrist cast. Nonweightbearing on left lower extremity and right upper extremity. ASA DCed - see discussion 2. Unfortunately, although she is high risk for DVT from immobility, she cannot be on any pharmacologic prophylaxis due to recent SDH. Same discussed with patient and Orthopedics- can be readdressed by Orthopedics at OP follow up. Ortho advised RUE sling for comfort. 7. Depression and anxiety/panic attacks: Continue fluoxetine which she takes 60 mg daily and Xanax which she uses infrequently. Stable. 8. Diet control type II DM: 9. Hypothyroidism: 10. PAF: Rate controlled. Not a candidate for anticoagulation secondary to recurrent falls and current subdural hematoma. May need reevaluation in 2 weeks to consider aspirin. Outpatient followup with cardiology.  Code Status: Full Family Communication: Discussed with patient and son Meredith Gonzalez. Upon extensive review of patients care since admission, CM team has determined that she did not have 3 day qualifying inpatient stay required by Medicare for SNF admission and hence she can go to SNF as  private pay or home with [facility]. Son upset and multiple folks have discussed with him. Disposition Plan: Medically stable for  discharge.   Consultants:  Orthopedics  Procedures:  None  Antibiotics:  None   HPI/Subjective: Pain controlled with MSIR.  Objective: Filed Vitals:   08/10/12 1335 08/10/12 2046 08/11/12 0431 08/11/12 0637  BP: 135/55 134/56 158/65 127/52  Pulse: 85 75 77 75  Temp: 98.4 F (36.9 C) 98.3 F (36.8 C) 98.1 F (36.7 C)   TempSrc: Oral Oral Oral   Resp: 16 18 19    Height:      Weight:   62.2 kg (137 lb 2 oz)   SpO2: 96% 99%      Intake/Output Summary (Last 24 hours) at 08/11/12 0719 Last data filed at 08/11/12 0550  Gross per 24 hour  Intake   1320 ml  Output   1900 ml  Net   -580 ml   Filed Weights   08/07/12 2222 08/10/12 0549 08/11/12 0431  Weight: 62 kg (136 lb 11 oz) 61.5 kg (135 lb 9.3 oz) 62.2 kg (137 lb 2 oz)    Exam:   General exam: Comfortable. Lying supine in bed.  Respiratory system: Clear. No increased work of breathing.  Cardiovascular system: S1 & S2 heard, RRR. No JVD, murmurs, gallops, clicks or pedal edema. Telemetry shows sinus rhythm.  Gastrointestinal system: Abdomen is nondistended, soft and nontender. Normal bowel sounds heard.  Central nervous system: Alert and oriented. No focal neurological deficits.  Extremities: Right forearm and wrist in a cast- intact and able to wiggle fingers. Left lower extremity in immobilizer-able to wiggle toes and peripheral pulses felt.   Data Reviewed: Basic Metabolic Panel:  Recent Labs Lab 08/07/12 1840 08/08/12 0512 08/09/12 0515  NA 138 135 135  K 3.4* 3.2* 3.7  CL 100 98 97  CO2 29 27 31   GLUCOSE 133* 107* 121*  BUN 11 8 9   CREATININE 0.52 0.47* 0.49*  CALCIUM 8.9 8.5 8.6   Liver Function Tests:  Recent Labs Lab 08/07/12 1840 08/09/12 0515 08/10/12 0452  AST 139* 38* 29  ALT 118* 59* 43*  ALKPHOS 148* 119* 118*  BILITOT 0.5 0.5 0.4  PROT 6.8 6.6 6.3  ALBUMIN 3.1* 2.9* 2.7*   No results found for this basename: LIPASE, AMYLASE,  in the last 168 hours No results found  for this basename: AMMONIA,  in the last 168 hours CBC:  Recent Labs Lab 08/07/12 1840 08/08/12 0512 08/09/12 0515  WBC 7.1 6.7 7.0  NEUTROABS 5.6  --   --   HGB 10.5* 10.0* 9.8*  HCT 32.2* 31.5* 30.7*  MCV 96.7 97.2 97.2  PLT 163 163 176   Cardiac Enzymes:  Recent Labs Lab 08/08/12 0512  CKTOTAL 112   BNP (last 3 results) No results found for this basename: PROBNP,  in the last 8760 hours CBG:  Recent Labs Lab 08/10/12 0733 08/10/12 1137 08/10/12 1655 08/10/12 2354 08/11/12 0548  GLUCAP 115* 106* 94 109* 150*    No results found for this or any previous visit (from the past 240 hour(s)).   Studies: Dg Clavicle Right  08/10/2012   *RADIOLOGY REPORT*  Clinical Data: Fall, clavicle pain  RIGHT CLAVICLE - 2+ VIEWS  Comparison: None.  Findings: Two views  right clavicle submitted.  No acute fracture or subluxation.  Diffuse osteopenia.  Degenerative changes are noted right AC joint.  There is deformity of the right humeral head probable due to fracture of indeterminate age.  Clinical correlation is necessary.  IMPRESSION: No acute fracture or subluxation.  Diffuse osteopenia. Degenerative changes are noted right AC joint.  There is deformity of the right humeral head probable due to fracture of indeterminate age.  Clinical correlation is necessary.   Original Report Authenticated By: Natasha Mead, M.D.     Additional labs:   Scheduled Meds: . amLODipine  10 mg Oral Daily  . aspirin EC  325 mg Oral BID  . FLUoxetine  60 mg Oral Daily  . folic acid  1 mg Oral Daily  . gabapentin  300 mg Oral QHS  . levothyroxine  100 mcg Oral Daily  . sodium chloride  3 mL Intravenous Q12H   Continuous Infusions:   Principal Problem:   Knee fracture, left Active Problems:   Right wrist fracture   Recurrent falls   Syncope   SDH (subdural hematoma)   Subdural hematoma   Hypokalemia   Anemia    Time spent: 35 minutes    [facility]  [facility] Pager  [phone].   If 8PM-8AM, please contact night-coverage at [email or web address], password [facility] 08/11/2012, 7:19 AM  LOS: 4 days

## 2012-08-12 LAB — GLUCOSE, CAPILLARY
Glucose-Capillary: 110 mg/dL — ABNORMAL HIGH (ref 70–99)
Glucose-Capillary: 117 mg/dL — ABNORMAL HIGH (ref 70–99)
Glucose-Capillary: 133 mg/dL — ABNORMAL HIGH (ref 70–99)

## 2012-08-12 NOTE — Progress Notes (Signed)
Physical Therapy Treatment Patient Details Name: Meredith Gonzalez MRN: [id number] DOB: 02-18-1936 Today's Date: 08/12/2012 Time: 5621-3086 PT Time Calculation (min): 25 min  PT Assessment / Plan / Recommendation Comments on Treatment Session  Pt assisted with standing for pericare and then pivot to recliner. Pt also performed exercises on R LE.  Pt aware of NWB R UE and L LE.  Pt with possible R nondisplaced acromial fx and R distal wrist fx with R UE NWB per note.  Will attempt to clarify if pt could possibly WB through elbow to use PFRW and if not, will attempt w/c mobility next visit.    Follow Up Recommendations  SNF;Supervision/Assistance - 24 hour     Does the patient have the potential to tolerate intense rehabilitation     Barriers to Discharge        Equipment Recommendations  Rolling walker with 5" wheels;Wheelchair (measurements PT);Wheelchair cushion (measurements PT) (possibly platform RW)    Recommendations for Other Services    Frequency     Plan Discharge plan remains appropriate;Frequency remains appropriate    Precautions / Restrictions Precautions Precautions: Fall Required Braces or Orthoses: Knee Immobilizer - Left Knee Immobilizer - Left: On at all times Restrictions Weight Bearing Restrictions: Yes RUE Weight Bearing: Non weight bearing LLE Weight Bearing: Non weight bearing   Pertinent Vitals/Pain Premedicated    Mobility  Bed Mobility Bed Mobility: Supine to Sit Rolling Right: 3: Mod assist Rolling Left: With rail Details for Bed Mobility Assistance: pt up on [facility] with OT on arrival Transfers Transfers: Sit to Stand;Stand to Sit;Stand Pivot Transfers Sit to Stand: 3: Mod assist;From chair/3-in-1;With upper extremity assist Stand to Sit: 3: Mod assist;To chair/3-in-1;With upper extremity assist Stand Pivot Transfers: 3: Mod assist Details for Transfer Assistance: verbal cues for safe technique, pt used L UE to assist and hold onto therapist, able to  maintain L LE NWB with cues, PT assisted with standing while OT performed pericare, pt then sat back down and rested prior to stand pivot to recliner Ambulation/Gait Ambulation/Gait Assistance: Not tested (comment)    Exercises General Exercises - Lower Extremity Ankle Circles/Pumps: AROM;Both;20 reps Gluteal Sets: AROM;Both;20 reps Short Arc Quad: AROM;Right;20 reps Heel Slides: AROM;Right;20 reps Hip ABduction/ADduction: AROM;Right;20 reps Straight Leg Raises: AROM;Right;20 reps Shoulder Exercises Shoulder Flexion:  (0- 90 degrees) Elbow Flexion: AROM;Right;10 reps Elbow Extension: AROM;Right;10 reps Digit Composite Flexion: AROM;Right;10 reps Composite Extension: AROM;Right;10 reps   PT Diagnosis:    PT Problem List:   PT Treatment Interventions:     PT Goals Acute Rehab PT Goals PT Goal: Sit to Stand - Progress: Progressing toward goal PT Goal: Stand to Sit - Progress: Progressing toward goal  Visit Information  Last PT Received On: 08/12/12 Assistance Needed: +2    Subjective Data  Subjective: Pt happy to be OOB.     Cognition  Cognition Arousal/Alertness: Awake/alert Behavior During Therapy: WFL for tasks assessed/performed Overall Cognitive Status: Within Functional Limits for tasks assessed    Balance     End of Session PT - End of Session Equipment Utilized During Treatment: Left knee immobilizer;Gait belt Activity Tolerance: Patient tolerated treatment well Patient left: in chair;with call bell/phone within reach;with nursing in room   GP     Savien Mamula,KATHrine E 08/12/2012, 12:20 PM Zenovia Jarred, PT, DPT 08/12/2012 Pager: [phone]

## 2012-08-12 NOTE — Progress Notes (Signed)
Subjective: Patient see and examined this am. Pain Controlled on medication regimen. Stills reports pain on medial tibial region and right wrist. She is tender over her right acromial region as well. Denies SOB, CP, calf pain. Reports no changes with congintion.  Patients states her plan is still unknow concerning disposition. She has been denied SNF and is now looking into Home Health. Fracture care was discussed in detailed with the patient.  Objective: Vital signs in last 24 hours: Temp:  [98 F (36.7 C)-98.2 F (36.8 C)] 98.2 F (36.8 C) (05/21 0544) Pulse Rate:  [77-85] 85 (05/21 0544) Resp:  [18] 18 (05/21 0544) BP: (121-134)/(58-65) 121/65 mmHg (05/21 0544) SpO2:  [95 %-100 %] 95 % (05/21 0544)  Intake/Output from previous day: 05/20 0701 - 05/21 0700 In: 1200 [P.O.:1200] Out: 1775 [Urine:1775] Intake/Output this shift:    No results found for this basename: HGB,  in the last 72 hours No results found for this basename: WBC, RBC, HCT, PLT,  in the last 72 hours No results found for this basename: NA, K, CL, CO2, BUN, CREATININE, GLUCOSE, CALCIUM,  in the last 72 hours No results found for this basename: LABPT, INR,  in the last 72 hours  Alert and oriented x3. improving ecchymosis noted in multiple areas. Right wrist splint in place. Right phalanges edema improving. RUE neurovascularly intact. LLE in knee immobilizer. Left foot full ROM. LLE neurovascularly intact.  Tender over right acromial region. Denies calf pain on palpation.   Assessment/Plan:  Possible non-displaced right Acromial Fracture: Sling for comfort and use RUE as tolerated. Perform ROM of right elbow joint and shoulder as directed. Non weightbearing of RUE.  Left Tibial Plateau Fracture: Remain in immobilizer. Pad for comfort.  Perform ankle pumps as directed. Non weightbearing of LLE.   Right Distal Radial fracture:Remain in Splint until seen in office for casting first of next week. Non-weightbearing of  RUE.  We will f/u with the patient in the office next week for further evaluation and treatment. Please call [phone] for a appointment.  Please call with any questions or any further assistance we may provide.    Nasiya Pascual L 08/12/2012, 7:39 AM

## 2012-08-12 NOTE — Discharge Summary (Signed)
Physician Discharge Summary  Meredith Gonzalez ZOX:[id number] DOB: May 19, 1935 DOA: 08/07/2012  PCP: Dr. Fredrik Cove Primary Neurologist: Dr Gerlene Burdock I. [place]  Cardiology: Dr. Joan Flores   Admit date: 08/07/2012 Discharge date: 08/12/2012  Time spent: Greater than 30 minutes  Recommendations for Outpatient Follow-up:  1. Dr. Erasmo Leventhal, Orthopedics: SNF to schedule an appointment for a visit in one week. 2. Dr. Maeola Harman, Neurosurgery: SNF to schedule an appointment for a visit in 3-4 weeks with repeat CT scan of head. 3. Right shoulder sling for comfort and use RUE as tolerated.  4. Nonweight bearing of right upper extremity.  5. Left lower extremity remains in immobilizer. Nonweight bearing of left lower extremity. 6. Outpatient followup at [facility], as per Recommendations by her primary caregivers, for further evaluation of recurrent syncope. 7. Recommended monthly vitamin B12 shot 1000 mcg. To clarify with her primary caregivers as to when it is due next.  Allergies-as per PCP records (reviewed records from her primary physician's) 1. Codeine 2. Darvon 3. Daypro 4. Enablex 5. Oxycodone 6. Risperdal 7. Talwin 8. Valium 9. Vicodin 10. Percocet: Pruritus   Discharge Diagnoses:  Principal Problem:   Knee fracture, left Active Problems:   Right wrist fracture   Recurrent falls   Syncope   SDH (subdural hematoma)   Subdural hematoma   Hypokalemia   Anemia   Discharge Condition: Improved & Stable  Diet recommendation: Heart healthy diet.  Filed Weights   08/07/12 2222 08/10/12 0549 08/11/12 0431  Weight: 62 kg (136 lb 11 oz) 61.5 kg (135 lb 9.3 oz) 62.2 kg (137 lb 2 oz)    History of present illness:  77 year old female patient, resident of [place], [place] who gets all her care in [place] or [place], [place], has  extensive PMH of hypothyroidism, vitamin B12 deficiency (on monthly B12 shots),  status post gastric bypass  surgery, anxiety, depression, fibromyalgia, anemia, headaches/migraine, insomnia, HTN, diet controlled DM, CVA, CAD status post stent implantation (no significant occlusive CAD by cath in 1995), pacemaker,PAF,  multiple surgeries, multiple allergies, recurrent syncopal episodes and falls for the last 2 years who has been extensively evaluated (CT head, cardiacs stress test, EEG,  EMG, nerve conduction studies and seven-day heart monitor).  She was diagnosed to have peripheral polyneuropathy and dysautonomia involving multiple organ systems as a cause of her recurrent syncope and falls. Her primary physicians are in the process of referring her to an academic center[facility], for further evaluation and recommendations for treatment. She is in the process of moving to [place],  [place]. She sustained a right wrist fracture in her hometown requiring a cast. While driving to the [place] area, she made a rest stop and at that time had a syncopal event, fell, hit her head and sustained left knee fracture. She was admitted to an outside facility. After discharge, her son picked her up and she was seen by orthopedic M.D. who apparently recommended nonweightbearing for 10 weeks. Obviously patient has difficulty ambulating and her son is unable to take care of her and is looking for SNF placement. She was brought to the ED on 08/07/12. CT head showed subdural hematoma-findings were discussed by EDP with Dr. Venetia Maxon of neurosurgery who recommended repeat CT this morning but no other intervention. Patient was admitted for further evaluation and management.    Hospital Course:  1. Recurrent syncope and falls: Extensively evaluated by her PCP, cardiologist and neurologist in her hometown and attributed to  peripheral polyneuropathy and dysautonomia involving multiple organ systems. They are coordinating referral to an academic center[facility] for further evaluation. Except 2-D echo, no further workup was  done here. Telemetry showed mostly sinus rhythm and occasional nonsustained runs of A. fib/PAT. 2-D echo: LVEF 55-60%, grade 1 diastolic dysfunction and PA pressure 53 mmHg. Recommend further evaluation at [facility]. 2. Acute subdural hematoma: Sustained secondary to fall and head injury. Neurosurgery, Dr. Venetia Maxon was contacted by EDP who recommended a followup CT in 24 hours after admission which showed decrease in size of the subdural hematoma. Discussed with Dr. Venetia Maxon on 5/20: He can followup patient in the outpatient setting in 3-4 weeks with repeat head CT. He also advised avoiding aspirin or blood thinners for one to 2 weeks and then may consider low dose aspirin if indicated for other reasons. 3. Hypokalemia: Repleted 4. Anemia: Possibly chronic and stable. 5. Mild transaminitis: No GI symptoms. CK normal. Continued to improve. Periodically followup as outpatient. Etiology unclear. 6. Right distal radial fracture: Orthopedics consulted. Patient remains in a splint until seen in orthopedics office in a week's time. Nonweightbearing on right upper extremity. 7. Left tibial plateau fracture: Orthopedics consulted. Continue immobilizer and perform ankle pumps as advised. Nonweightbearing on left lower extremity. Orthopedics had started patient on aspirin 325 mg by mouth twice a day for DVT prophylaxis which was discontinued secondary to problem #2. Outpatient followup with orthopedics to decide resumption of low-dose aspirin. Patient had significant pruritus to Percocet which was discontinued. Her pain is adequately controlled on MSIR. 8. Possible nondisplaced right acromial fracture: X-ray of right clavicle was done due to complain of right collar bone pain. No clavicle fracture was found. Orthopedics consulted and recommends sling for comfort and use of right upper extremity as tolerated. 9. Depression and anxiety: Continue fluoxetine. The Xanax does was marginally reduced which he was taking  infrequently anyways. Some of these medications may also be contributing to unsteady gait and falls. 10. Diet-controlled type II DM. 11. Hypothyroidism: Continue Synthroid. 12. CAD status post stent/pacemaker/PAF/HTN: Patient was asymptomatic of chest pain. Telemetry monitor mostly showed sinus rhythm. She may have been on low dose aspirin (was not on her home meds)-currently aspirin held secondary to problem #2. Outpatient followup with her primary cardiologist. 13. History of B12 deficiency: Has been on monthly B12 shots-not sure when she received the last one.  Patient and her son were unable to care for her independently. They were expecting to go to skilled nursing facility from the hospital covered by her primary insurance. Her case was extensively reviewed by multiple people since admission and it was determined that she did not have three-day qualifying inpatient stay required by Medicare for SNF admission. Same was conveyed to patient and her son. Her son Mr. Rosea Dory was quite upset because he was under the impression that she met inpatient criteria. Multiple people discussed with him repeatedly to clarify the situation. He has finally decided to admit her to SNF under private pay.  Procedures:  None   Consultations:  Orthopedics  Discussed with neurosurgery, Dr. Maeola Harman  PT and OT  Case management  Clinical social worker  Discharge Exam:  Complaints: Today is her birthday! Pain is controlled. Denies any other complaints. No pruritus   Filed Vitals:   08/11/12 0637 08/11/12 1401 08/11/12 2054 08/12/12 0544  BP: 127/52 122/58 134/59 121/65  Pulse: 75 77 78 85  Temp:   98 F (36.7 C) 98.2 F (36.8 C)  TempSrc:  Oral   Resp:  18 18 18   Height:      Weight:      SpO2:  96% 100% 95%    General exam: Comfortable. Sitting up in reclining chair with assistance.  Respiratory system: Clear. No increased work of breathing.  Cardiovascular system: S1 & S2 heard, RRR.  No JVD, murmurs, gallops, clicks or pedal edema. Telemetry shows sinus rhythm.  Gastrointestinal system: Abdomen is nondistended, soft and nontender. Normal bowel sounds heard.  Central nervous system: Alert and oriented. No focal neurological deficits.  Extremities: Right forearm and wrist in a splint- intact and able to wiggle fingers. Left lower extremity in immobilizer-able to wiggle toes and peripheral pulses felt.   Discharge Instructions      Discharge Orders   Future Orders Complete By Expires     Call MD for:  difficulty breathing, headache or visual disturbances  As directed     Call MD for:  persistant dizziness or light-headedness  As directed     Call MD for:  persistant nausea and vomiting  As directed     Call MD for:  redness, tenderness, or signs of infection (pain, swelling, redness, odor or green/yellow discharge around incision site)  As directed     Call MD for:  severe uncontrolled pain  As directed     Call MD for:  temperature >100.4  As directed     Call MD for:  As directed     Comments:      Change in mental status, passing out.    Diet - low sodium heart healthy  As directed     Discharge instructions  As directed     Comments:      No weight bearing on left lower limb or right upper limb.        Medication List    STOP taking these medications       clonazePAM 1 MG tablet  Commonly known as:  KLONOPIN     LAMISIL PO     oxyCODONE-acetaminophen 5-325 MG per tablet  Commonly known as:  PERCOCET/ROXICET     traMADol 50 MG tablet  Commonly known as:  ULTRAM      TAKE these medications       ALPRAZolam 0.25 MG tablet  Commonly known as:  XANAX  Take 1 tablet (0.25 mg total) by mouth 2 (two) times daily as needed for sleep or anxiety.     amLODipine 10 MG tablet  Commonly known as:  NORVASC  Take 10 mg by mouth daily.     FLUoxetine 20 MG capsule  Commonly known as:  PROZAC  Take 60 mg by mouth daily.     folic acid 1 MG tablet   Commonly known as:  FOLVITE  Take 1 mg by mouth daily.     gabapentin 300 MG capsule  Commonly known as:  NEURONTIN  Take 300 mg by mouth at bedtime.     levothyroxine 100 MCG tablet  Commonly known as:  SYNTHROID, LEVOTHROID  Take 100 mcg by mouth daily.     morphine 15 MG tablet  Commonly known as:  MSIR  Take 0.5 tablets (7.5 mg total) by mouth every 4 (four) hours as needed for pain.     zolpidem 10 MG tablet  Commonly known as:  AMBIEN  Take 0.5 tablets (5 mg total) by mouth at bedtime as needed for sleep.       Follow-up Information   Follow up with COLLINS,ROBERT  ANDREW, MD. Schedule an appointment as soon as possible for a visit in 1 week.   Contact information:   [address] [place] [address] [phone]       Follow up with Primary Medical Doctor. Schedule an appointment as soon as possible for a visit in 1 week.      Follow up with STERN,JOSEPH D, MD. Schedule an appointment as soon as possible for a visit in 3 weeks. (To be seen with repeat CT Scan of Head.)    Contact information:   [address] [place] [address] [phone]        The results of significant diagnostics from this hospitalization (including imaging, microbiology, ancillary and laboratory) are listed below for reference.    Significant Diagnostic Studies: Dg Clavicle Right  08/10/2012   *RADIOLOGY REPORT*  Clinical Data: Fall, clavicle pain  RIGHT CLAVICLE - 2+ VIEWS  Comparison: None.  Findings: Two views  right clavicle submitted.  No acute fracture or subluxation.  Diffuse osteopenia.  Degenerative changes are noted right AC joint.  There is deformity of the right humeral head probable due to fracture of indeterminate age.  Clinical correlation is necessary.  IMPRESSION: No acute fracture or subluxation.  Diffuse osteopenia. Degenerative changes are noted right AC joint.  There is deformity of the  right humeral head probable due to fracture of indeterminate age.  Clinical correlation is necessary.   Original Report Authenticated By: Natasha Mead, M.D.   Ct Head Wo Contrast  08/08/2012   *RADIOLOGY REPORT*  Clinical Data: Recurrent falls  CT HEAD WITHOUT CONTRAST  Technique:  Contiguous axial images were obtained from the base of the skull through the vertex without contrast.  Comparison: 08/07/2012  Findings: The left anterior parafalcine extra-axial hematoma has improved and now it is 10 mm in thickness.  No midline shift.  No evidence of new intra-axial hemorrhage.  Chronic ischemic changes in the periventricular white matter and global atrophy are stable.  IMPRESSION: Slightly improved left anterior parafalcine extra-axial hemorrhage. No new hemorrhage.  Chronic ischemic changes and atrophy are noted.   Original Report Authenticated By: Jolaine Click, M.D.   Ct Head Wo Contrast  08/07/2012   *RADIOLOGY REPORT*  Clinical Data: Multiple falls.  CT HEAD WITHOUT CONTRAST  Technique:  Contiguous axial images were obtained from the base of the skull through the vertex without contrast.  Comparison: None.  Findings: Left, anterior parafalcine subdural hematoma is identified.  This measures 11 mm in thickness and has mild mass effect upon the left frontal lobe.  There is diffuse patchy low density throughout the subcortical and periventricular white matter consistent with chronic small vessel ischemic change.  There is prominence of the sulci and ventricles consistent with brain atrophy.  The paranasal sinuses are clear.  The mastoid air cells are clear. The skull is intact.  IMPRESSION:  1.  Small vessel ischemic disease and brain atrophy. 2.  There is an acute left anterior parafalcine subdural hematoma measuring 11 mm in thickness.  Critical Value/emergent results were called by telephone at the time of interpretation on 08/07/2012 at 6:48 p.m. to Dr. Judd Lien, who verbally acknowledged these results.   Original  Report Authenticated By: Signa Kell, M.D.   2-D echo Study Conclusions  - Left ventricle: The cavity size was normal. Wall thickness was normal. Systolic function was normal. The estimated ejection fraction was in the range of 55% to 60%. Wall  motion was normal; there were no regional wall motion abnormalities. Doppler parameters are consistent with abnormal left ventricular relaxation (grade 1 diastolic dysfunction). - Mitral valve: Calcified annulus. - Left atrium: The atrium was mildly dilated. - Pulmonary arteries: Systolic pressure was moderately increased. PA peak pressure: 53mm Hg (S). - Pericardium, extracardiac: A trivial pericardial effusion was identified.   Microbiology: No results found for this or any previous visit (from the past 240 hour(s)).   Labs: Basic Metabolic Panel:  Recent Labs Lab 08/07/12 1840 08/08/12 0512 08/09/12 0515  NA 138 135 135  K 3.4* 3.2* 3.7  CL 100 98 97  CO2 29 27 31   GLUCOSE 133* 107* 121*  BUN 11 8 9   CREATININE 0.52 0.47* 0.49*  CALCIUM 8.9 8.5 8.6   Liver Function Tests:  Recent Labs Lab 08/07/12 1840 08/09/12 0515 08/10/12 0452  AST 139* 38* 29  ALT 118* 59* 43*  ALKPHOS 148* 119* 118*  BILITOT 0.5 0.5 0.4  PROT 6.8 6.6 6.3  ALBUMIN 3.1* 2.9* 2.7*   No results found for this basename: LIPASE, AMYLASE,  in the last 168 hours No results found for this basename: AMMONIA,  in the last 168 hours CBC:  Recent Labs Lab 08/07/12 1840 08/08/12 0512 08/09/12 0515  WBC 7.1 6.7 7.0  NEUTROABS 5.6  --   --   HGB 10.5* 10.0* 9.8*  HCT 32.2* 31.5* 30.7*  MCV 96.7 97.2 97.2  PLT 163 163 176   Cardiac Enzymes:  Recent Labs Lab 08/08/12 0512  CKTOTAL 112   BNP: BNP (last 3 results) No results found for this basename: PROBNP,  in the last 8760 hours CBG:  Recent Labs Lab 08/11/12 0722 08/11/12 1137 08/12/12 0004 08/12/12 0550 08/12/12 1156  GLUCAP 119* 129* 133* 110* 117*    Additional  labs:    Signed:  Quintana Canelo  [facility] 08/12/2012, 12:20 PM

## 2012-08-12 NOTE — H&P (Signed)
Report called to [facility], given to Minitra. Pt transported via daughter in law to facility.

## 2012-08-12 NOTE — Progress Notes (Signed)
Occupational Therapy Treatment Patient Details Name: Meredith Gonzalez MRN: [id number] DOB: [birth year] Today's Date: 08/12/2012 Time: 9147-8295 OT Time Calculation (min): 31 min  OT Assessment / Plan / Recommendation    Follow Up Recommendations  SNF             Frequency Min 3X/week   Plan Discharge plan remains appropriate    Precautions / Restrictions Precautions Required Braces or Orthoses: Knee Immobilizer - Left Knee Immobilizer - Left: On at all times Restrictions Weight Bearing Restrictions: Yes RUE Weight Bearing: Non weight bearing LLE Weight Bearing: Non weight bearing       ADL  Toilet Transfer: Performed;Maximal assistance Toilet Transfer Method: Sit to stand;Stand pivot Toilet Transfer Equipment: Bedside commode Toileting - Clothing Manipulation and Hygiene: Performed;+2 Total assistance Toileting - Architect and Hygiene: Patient Percentage: 60% Where Assessed - Glass blower/designer Manipulation and Hygiene: Standing      OT Goals ADL Goals ADL Goal: Statistician - Progress: Progressing toward goals Arm Goals Arm Goal: Additional Goal #1 - Progress: Progressing toward goals Arm Goal: Additional Goal #2 - Progress: Progressing toward goals  Visit Information  Last OT Received On: 08/12/12          Cognition  Cognition Arousal/Alertness: Awake/alert Behavior During Therapy: [name] for tasks assessed/performed Overall Cognitive Status: Within Functional Limits for tasks assessed    Mobility  Bed Mobility Bed Mobility: Supine to Sit Rolling Right: 3: Mod assist Rolling Left: With rail Transfers Transfers: Sit to Stand;Stand to Sit Sit to Stand: 2: Max assist;From bed;From chair/3-in-1;From toilet Stand to Sit: To toilet;To chair/3-in-1    Exercises   Elbow Flexion: AROM;Right;10 reps Elbow Extension: AROM;Right;10 reps Digit Composite Flexion: AROM;Right;10 reps Composite Extension: AROM;Right;10 reps Did not perform shoulder  exercise this OT visit as pt now NWB through R shoulder per PA note.  Will clarify ROM restrictions for R shoulder.  Balance     End of Session OT - End of Session Activity Tolerance: Patient tolerated treatment well Patient left: in chair       Meredith Gonzalez, [place] Kung 08/12/2012, 11:00 AM

## 2012-08-12 NOTE — Progress Notes (Addendum)
Addendum: CSW sent dc summary and avs to [facility] via [facility]. Per Eber Jones patient is ok to transfer to [facility]. Patient chart copy being sent to facility with fl2 and prescriptions. Patient daughter in law will be providing transportation as confirmed with pt daughter in law and patient. Patient and patient daughter in law thanked csw for concern and support..No further Clinical Social Work needs, signing off.  Doree Albee  [phone] .08/12/2012 13:52pm   Addendum 1236pm  CSW confirmed with Eber Jones that patient daughter in law and patient will be completing paperwork in patient room at 1245 and pt daughter in law will be transporting patient to [facility]. Per Caroly, paperwork should be compelted around 130 pm and patient will be able to transferred at that time pending dc summary.  CSW informed RN and Attending MD. CSW will send completed dc summary to [facility] once completed.   Catha Gosselin, Theresia Majors  [phone] .08/12/2012 1236pm   Addednum 1206pm  Patient and patient son plan to complete paperwork with [facility] liason in patient room this afternoon. Pt son stated he will be providing transportation for patient to [facility]. CSW met with pt to inform patient of updates and to confirm with pt. Pt thanked csw for concern and apologized for the hassle. CSW informed RN of updated plan. If patient and pt son decide they would like to do non emergency ambulance transportation rn will call CSW and CSW will assist. Pt son stated, Since I have been screwed with medicare at [facility] he doesn't want to take chances of being screwed by ambulance and receive a bill." Pt son also expressed concern again regarding patient status being observation, CSW educated patient on CSW role and unfortunately does not know the utilization review and that the rn case manager would be the appropriate person to discuss. Patient son stated he had already spoken with rn cm and those involving utilization  review.   Addendum: CSW received call from [facility] stating that [facility] secondary will be able to help pay for patient physical therapy. CSW spoke with pt son, stating that patient can be admitted to [facility] however pt would need to pay 30 day payment up front. Per [facility] that amount is $6300. Pt son had questions regarding amount up front, CSW directed pt son to speak with Admissions at [facility]. Pt son also shared that he was speaking with the Obudsman.   Doree Albee  [phone] .08/12/2012 11:15am   CSW spoke with pt son this morning, and confirmed patient and patient son are still planning on discharging to skilled nursing as private pay. Patient son is interested in [address], [place], and [facility]. CSW is awaiting return call regarding patient secondary insurance of [employer] with coverage for physical therapy at the facility. Pt son expressed concerns regarding patient mother and csw directed patient to speak with pt nurse as directed last night. CSW gave pt nurses station number to speak with patient nurse regarding medical concerns. Pt son inquired about appeal process, CSW directed pt to rn case manager regarding appeal process. RN CM explained to patient that due to patient observation status patient is unable to appeal.   .Catha Gosselin, LCSWA  [phone] .08/12/2012 1039am

## 2012-08-12 NOTE — Progress Notes (Signed)
Social Worker Kristen informed me that she had spoken to the patient's son and the son agreed for his mother to stay tonight here at the hospital. Baxter Hire told the patient's son that for any questions regarding medical issues that he should speak with the nurse or attending MD.

## 2012-08-27 ENCOUNTER — Non-Acute Institutional Stay (SKILLED_NURSING_FACILITY): Payer: Medicare Other | Admitting: Internal Medicine

## 2012-08-27 DIAGNOSIS — S8290XS Unspecified fracture of unspecified lower leg, sequela: Secondary | ICD-10-CM

## 2012-08-27 DIAGNOSIS — E039 Hypothyroidism, unspecified: Secondary | ICD-10-CM

## 2012-08-27 DIAGNOSIS — S42309S Unspecified fracture of shaft of humerus, unspecified arm, sequela: Secondary | ICD-10-CM

## 2012-08-27 DIAGNOSIS — I6200 Nontraumatic subdural hemorrhage, unspecified: Secondary | ICD-10-CM

## 2012-08-27 DIAGNOSIS — S065X9A Traumatic subdural hemorrhage with loss of consciousness of unspecified duration, initial encounter: Secondary | ICD-10-CM

## 2012-08-27 DIAGNOSIS — S62101S Fracture of unspecified carpal bone, right wrist, sequela: Secondary | ICD-10-CM

## 2012-08-27 DIAGNOSIS — S82892S Other fracture of left lower leg, sequela: Secondary | ICD-10-CM

## 2012-08-28 ENCOUNTER — Other Ambulatory Visit: Payer: Self-pay | Admitting: Neurosurgery

## 2012-08-28 DIAGNOSIS — S065X9A Traumatic subdural hemorrhage with loss of consciousness of unspecified duration, initial encounter: Secondary | ICD-10-CM

## 2012-08-31 ENCOUNTER — Other Ambulatory Visit: Payer: Self-pay | Admitting: *Deleted

## 2012-08-31 MED ORDER — ALPRAZOLAM 0.25 MG PO TABS: ORAL_TABLET | ORAL | Status: DC

## 2012-09-07 ENCOUNTER — Non-Acute Institutional Stay (SKILLED_NURSING_FACILITY): Payer: Medicare Other | Admitting: Adult Health

## 2012-09-07 DIAGNOSIS — IMO0001 Reserved for inherently not codable concepts without codable children: Secondary | ICD-10-CM

## 2012-09-07 DIAGNOSIS — R52 Pain, unspecified: Secondary | ICD-10-CM

## 2012-09-07 DIAGNOSIS — F329 Major depressive disorder, single episode, unspecified: Secondary | ICD-10-CM

## 2012-09-07 DIAGNOSIS — S62101D Fracture of unspecified carpal bone, right wrist, subsequent encounter for fracture with routine healing: Secondary | ICD-10-CM

## 2012-09-07 DIAGNOSIS — S8290XD Unspecified fracture of unspecified lower leg, subsequent encounter for closed fracture with routine healing: Secondary | ICD-10-CM

## 2012-09-07 DIAGNOSIS — S82892D Other fracture of left lower leg, subsequent encounter for closed fracture with routine healing: Secondary | ICD-10-CM

## 2012-09-08 ENCOUNTER — Encounter: Payer: Self-pay | Admitting: Adult Health

## 2012-09-08 DIAGNOSIS — F32A Depression, unspecified: Secondary | ICD-10-CM | POA: Insufficient documentation

## 2012-09-08 DIAGNOSIS — R52 Pain, unspecified: Secondary | ICD-10-CM | POA: Insufficient documentation

## 2012-09-08 DIAGNOSIS — F329 Major depressive disorder, single episode, unspecified: Secondary | ICD-10-CM | POA: Insufficient documentation

## 2012-09-08 NOTE — Progress Notes (Signed)
  Subjective:    Patient ID: Meredith Gonzalez, female    DOB: 29-Jun-1935, 77 y.o.   MRN: [id number]  HPI This is a 77 year old female who complains of pain on left knee and right wrist. She has a left knee and a right wrist fracture. She reports having abdominal pain whenever she takes Tramadol. Her husband and son recently passed away and patient has been noted to have depressed mood.   Review of Systems  Constitutional: Negative.   HENT: Negative.   Eyes: Negative.   Respiratory: Negative for shortness of breath.   Cardiovascular: Positive for leg swelling.  Gastrointestinal: Negative.   Endocrine: Negative.   Genitourinary: Negative.   Neurological: Negative.   Hematological: Negative for adenopathy. Does not bruise/bleed easily.  Psychiatric/Behavioral: Negative.        Objective:   Physical Exam  Nursing note and vitals reviewed. Constitutional: She is oriented to person, place, and time. She appears well-developed and well-nourished.  HENT:  Head: Normocephalic and atraumatic.  Right Ear: External ear normal.  Left Ear: External ear normal.  Eyes: Conjunctivae are normal. Pupils are equal, round, and reactive to light.  Neck: Normal range of motion. Neck supple. No thyromegaly present.  Cardiovascular: Normal rate, regular rhythm and normal heart sounds.   Pulmonary/Chest: Effort normal and breath sounds normal.  Abdominal: Soft. Bowel sounds are normal.  Musculoskeletal: She exhibits edema and tenderness.  LLE edema, 1+, has splint  Has Right wrist cast  Neurological: She is alert and oriented to person, place, and time.  Skin: Skin is warm and dry.  Psychiatric: Her behavior is normal. Judgment and thought content normal.  Sad affect   LABS: 08/21/12  tsh 0.476  hgbA1c 5.7 08/13/12  Wbc 6.9  hgb 10.1  hct 30.6  Bmp nl       Assessment & Plan:   Depression - start Remeron 15 mg PO Q HS  Knee fracture, left- continue LLE splint  Right wrist fracture - hast  cast on  Pain - discontinue Ultram; start Tylenol ES 500 mg/tab give 2 tabs =1000mg  PO Q 8 hours

## 2012-09-15 DIAGNOSIS — E039 Hypothyroidism, unspecified: Secondary | ICD-10-CM | POA: Insufficient documentation

## 2012-09-15 NOTE — Progress Notes (Signed)
Patient ID: Meredith Gonzalez, female   DOB: Feb 23, 1936, 77 y.o.   MRN: [id number]        HISTORY & PHYSICAL  DATE: 08/27/2012   FACILITY: [facility]  LEVEL OF CARE: SNF (31)  ALLERGIES:  Allergies  Allergen Reactions  . Valium (Diazepam)     Makes patient hyper  . Codeine Hives and Itching    Can take with Benadryl  . Darvon (Propoxyphene Hcl) Itching    Can take with Benadryl  . Daypro (Oxaprozin) Itching    Can take with Benadryl  . Enablex (Darifenacin Hydrobromide Er) Itching    Can take with Benadryl  . Oxycodone Itching    Can take with Benadryl  . Risperdal (Risperidone) Itching    Can take with Benadryl  . Talwin (Pentazocine) Itching    Can take with Benadryl  . Vicodin (Hydrocodone-Acetaminophen) Itching    Can take with Benadryl    CHIEF COMPLAINT:  Manage subdural hematoma, left tibial plateau fracture, and right wrist fracture.    HISTORY OF PRESENT ILLNESS:  The patient is a 77 year-old, Caucasian female who was admitted to this facility for short-term rehabilitation.  She has the following problems:    SUBDURAL HEMATOMA:  Patient had a fall and sustained hip injury.  CT of the head showed subdural hematoma.  Neurosurgery recommended repeat CT in 24 hours, but no surgical intervention.  Repeat CT in 24 hours showed decrease in size of the subdural hematoma.  Neurosurgery recommended outpatient follow-up in 3-4 weeks with repeat head CT.  She is to avoid aspirin or other blood thinners for 1-2 weeks.  Patient denies any headaches, dizziness or visual disturbances.    LEFT TIBIAL PLATEAU FRACTURE:  In the fall, patient also sustained a left tibial plateau fracture.  Orthopedics placed her on an immobilizer and non-weightbearing status.  She denies any knee pain currently.    RIGHT WRIST FRACTURE:  Coming from the fall, patient sustained a right wrist fracture and  currently she has a cast.  She denies right wrist pain.    PAST MEDICAL HISTORY :   Past Medical History  Diagnosis Date  . Hypothyroidism   . Hyperthyroidism   . Depression   . Diabetes mellitus without complication   . Pacemaker   . Stroke 2000  . Headache(784.0)   . Cancer     basal cell skin cancers  . Arthritis   . Anemia   . Incontinence of bowel   . Incontinence of urine     PAST SURGICAL HISTORY: Past Surgical History  Procedure Laterality Date  . Pacemaker insertion    . Basal cell cancer removal    . Abdominal hysterectomy    . Cholecystectomy    . Femur fracture surgery      bilateral  . Right ankle surgery    . Carpal tunnel release      bilateral  . Gastric bypass    . Eye surgery      right eye catarace/lens implant  . Colonoscopy      SOCIAL HISTORY:  reports that she quit smoking about 40 years ago. Her smoking use included Cigarettes. She has a 2 pack-year smoking history. She has never used smokeless tobacco. She reports that  drinks alcohol. She reports that she does not use illicit drugs.  FAMILY HISTORY:  Family History  Problem Relation Age of Onset  . Congestive Heart Failure Mother   . Heart attack Father   . Alcoholism Father   .  Alcoholism Sister   . Alcoholism Brother   . Alcoholism Brother   . Throat cancer Brother     CURRENT MEDICATIONS: Reviewed per [facility]  REVIEW OF SYSTEMS:  See HPI otherwise 14 point ROS is negative.  PHYSICAL EXAMINATION  VS:  T 98.9        P 84      RR 14      BP 124/60       POX 91% room air       WT (Lb)  GENERAL: no acute distress, normal body habitus EYES: conjunctivae normal, sclerae normal, normal eye lids MOUTH/THROAT: lips without lesions,no lesions in the mouth,tongue is without lesions,uvula elevates in midline NECK: supple, trachea midline, no neck masses, no thyroid tenderness, no thyromegaly LYMPHATICS: no LAN in the neck, no supraclavicular LAN RESPIRATORY: breathing is even & unlabored, BS CTAB CARDIAC: RRR, no murmur,no extra heart sounds, no edema GI:  ABDOMEN:  abdomen soft, normal BS, no masses, no tenderness  LIVER/SPLEEN: no hepatomegaly, no splenomegaly MUSCULOSKELETAL: HEAD: normal to inspection & palpation BACK: no kyphosis, scoliosis or spinal processes tenderness EXTREMITIES: LEFT UPPER EXTREMITY: full range of motion, normal strength & tone RIGHT UPPER EXTREMITY: right wrist is in a cast LEFT LOWER EXTREMITY:  full range of motion, normal strength & tone RIGHT LOWER EXTREMITY: in an immobilizer PSYCHIATRIC: the patient is alert & oriented to person, affect & behavior appropriate  LABS/RADIOLOGY: Hemoglobin 10.1, MCV 93.3, otherwise CBC normal.    Glucose 104, otherwise BMP normal.    Right clavicle x-ray did not show any acute fracture or subluxation.      2D-echo showed normal systolic function and EF 55-60%.  There is mild diastolic dysfunction.   Glucose 121, otherwise BMP normal.    ALT 43, alkaline phosphatase 118, albumin 2.7, otherwise liver profile normal.    Hemoglobin 9.8, MCV 97.2, otherwise CBC normal.    Total CK 112.    ASSESSMENT/PLAN:  Subdural hematoma.  Follow up with neurosurgeon, who recommended no surgical intervention.   Left tibial plateau fracture.  Continue knee immobilizer and non-weightbearing status.  Continue rehabilitation.  Right wrist fracture.  Continue cast and follow up with Orthopedics.    Hypothyroidism.  Continue Synthroid.    Diabetes mellitus.  Diet controlled.    CAD.  Stable.    B12 deficiency.  Continue B12 supplementation.    I have reviewed patient's medical records received at admission/from hospitalization.  CPT CODE: 45409

## 2012-09-17 ENCOUNTER — Other Ambulatory Visit: Payer: Medicare Other

## 2012-09-17 ENCOUNTER — Non-Acute Institutional Stay (SKILLED_NURSING_FACILITY): Payer: Medicare Other | Admitting: Adult Health

## 2012-09-17 ENCOUNTER — Other Ambulatory Visit: Payer: Self-pay | Admitting: Neurosurgery

## 2012-09-17 ENCOUNTER — Ambulatory Visit
Admission: RE | Admit: 2012-09-17 | Discharge: 2012-09-17 | Disposition: A | Discharge status: Home / Self Care | Payer: Medicare Other | Source: Ambulatory Visit | Attending: Neurosurgery | Admitting: Neurosurgery

## 2012-09-17 DIAGNOSIS — R600 Localized edema: Secondary | ICD-10-CM

## 2012-09-17 DIAGNOSIS — R609 Edema, unspecified: Secondary | ICD-10-CM

## 2012-09-17 DIAGNOSIS — S065X9A Traumatic subdural hemorrhage with loss of consciousness of unspecified duration, initial encounter: Secondary | ICD-10-CM

## 2012-10-08 ENCOUNTER — Non-Acute Institutional Stay (SKILLED_NURSING_FACILITY): Payer: Medicare Other | Admitting: Adult Health

## 2012-10-08 ENCOUNTER — Encounter: Payer: Self-pay | Admitting: Adult Health

## 2012-10-08 DIAGNOSIS — I10 Essential (primary) hypertension: Secondary | ICD-10-CM | POA: Insufficient documentation

## 2012-10-08 DIAGNOSIS — R600 Localized edema: Secondary | ICD-10-CM

## 2012-10-08 DIAGNOSIS — G589 Mononeuropathy, unspecified: Secondary | ICD-10-CM

## 2012-10-08 DIAGNOSIS — S82202F Unspecified fracture of shaft of left tibia, subsequent encounter for open fracture type IIIA, IIIB, or IIIC with routine healing: Secondary | ICD-10-CM

## 2012-10-08 DIAGNOSIS — E119 Type 2 diabetes mellitus without complications: Secondary | ICD-10-CM

## 2012-10-08 DIAGNOSIS — I4819 Other persistent atrial fibrillation: Secondary | ICD-10-CM | POA: Insufficient documentation

## 2012-10-08 DIAGNOSIS — I4891 Unspecified atrial fibrillation: Secondary | ICD-10-CM

## 2012-10-08 DIAGNOSIS — I6200 Nontraumatic subdural hemorrhage, unspecified: Secondary | ICD-10-CM

## 2012-10-08 DIAGNOSIS — I25810 Atherosclerosis of coronary artery bypass graft(s) without angina pectoris: Secondary | ICD-10-CM

## 2012-10-08 DIAGNOSIS — S8290XD Unspecified fracture of unspecified lower leg, subsequent encounter for closed fracture with routine healing: Secondary | ICD-10-CM

## 2012-10-08 DIAGNOSIS — IMO0001 Reserved for inherently not codable concepts without codable children: Secondary | ICD-10-CM

## 2012-10-08 DIAGNOSIS — S62101D Fracture of unspecified carpal bone, right wrist, subsequent encounter for fracture with routine healing: Secondary | ICD-10-CM

## 2012-10-08 DIAGNOSIS — G629 Polyneuropathy, unspecified: Secondary | ICD-10-CM | POA: Insufficient documentation

## 2012-10-08 DIAGNOSIS — S82202A Unspecified fracture of shaft of left tibia, initial encounter for closed fracture: Secondary | ICD-10-CM | POA: Insufficient documentation

## 2012-10-08 DIAGNOSIS — S065X9A Traumatic subdural hemorrhage with loss of consciousness of unspecified duration, initial encounter: Secondary | ICD-10-CM

## 2012-10-08 DIAGNOSIS — E039 Hypothyroidism, unspecified: Secondary | ICD-10-CM

## 2012-10-08 DIAGNOSIS — F329 Major depressive disorder, single episode, unspecified: Secondary | ICD-10-CM

## 2012-10-08 DIAGNOSIS — R609 Edema, unspecified: Secondary | ICD-10-CM

## 2012-10-08 NOTE — Progress Notes (Signed)
  Subjective:    Patient ID: Meredith Gonzalez, female    DOB: 05-10-1935, 77 y.o.   MRN: [id number]  HPI This is a 77 year old female who is being seen for a routine visit. Patient had a fall at home sustaining a subdural hematoma, left tibial fracture and right wrist fracture. She is here for a short-term rehabilitation.   Review of Systems  Constitutional: Negative.   HENT: Negative.   Eyes: Negative.   Respiratory: Negative for cough and shortness of breath.   Cardiovascular: Positive for leg swelling.  Gastrointestinal: Negative for abdominal pain and abdominal distention.  Endocrine: Negative.   Genitourinary: Negative.   Neurological: Negative.   Hematological: Negative for adenopathy. Does not bruise/bleed easily.  Psychiatric/Behavioral: Negative.        Objective:   Physical Exam  Nursing note and vitals reviewed. Constitutional: She is oriented to person, place, and time. She appears well-developed and well-nourished.  HENT:  Head: Normocephalic and atraumatic.  Right Ear: External ear normal.  Left Ear: External ear normal.  Nose: Nose normal.  Mouth/Throat: Oropharynx is clear and moist.  Eyes: Conjunctivae and EOM are normal. Pupils are equal, round, and reactive to light.  Neck: Normal range of motion. Neck supple. No thyromegaly present.  Cardiovascular: Normal rate, regular rhythm and normal heart sounds.   Pulmonary/Chest: Effort normal and breath sounds normal. No respiratory distress.  Abdominal: Soft. Bowel sounds are normal.  Musculoskeletal: She exhibits edema. She exhibits no tenderness.  Bilateral lower extremity edema, 2+  Neurological: She is alert and oriented to person, place, and time.  Skin: Skin is warm and dry.  Psychiatric: She has a normal mood and affect. Her behavior is normal. Judgment and thought content normal.     LABS: 09/04/12 C-Difficile toxin - negative 09/03/12 wbc 4.8 hemoglobin 11.0 hematocrit 32.5 sodium 139 potassium 3.6 glucose  100 BUN 8 creatinine 0.49 calcium 8.5 09/02/12  urine culture shows greater than equal to 100,000 colonies per mL Klebsiella pneumonia a 08/21/12  tsh 0.476  hgbA1c 5.7 08/13/12  Wbc 6.9  hgb 10.1  hct 30.6  Bmp nl   Medications reviewed.    Assessment & Plan:    Edema extremities - stable; awaiting cardiology consult  Atrial fibrillation - rate-controlled; awaiting cardiology consult  Hypertension - well-controlled  Hypothyroidism - stable  Neuropathy - stable  Left tibial fracture - continue PT and OT  Diabetes - diet controlled  CAD (coronary artery disease) of artery bypass graft - stable  Depression - stable  Subdural hematoma - followed up by neurologist  Right wrist fracture - continue PT and OT and and a

## 2012-10-08 NOTE — Progress Notes (Signed)
  Subjective:    Patient ID: Meredith Gonzalez, female    DOB: 1936-01-15, 77 y.o.   MRN: [id number]  HPI  This is a 77 year old female who was noted to have bilateral lower extremity edema, 2+. Bilateral extremities are not erythematous, not tender nor warm to touch.  Review of Systems  Constitutional: Negative.   Eyes: Negative.   Respiratory: Negative for shortness of breath.   Cardiovascular: Positive for leg swelling.  Gastrointestinal: Negative.   Endocrine: Negative.   Genitourinary: Negative.   Neurological: Negative.   Hematological: Negative for adenopathy. Does not bruise/bleed easily.       Objective:   Physical Exam  Nursing note and vitals reviewed. Constitutional: She is oriented to person, place, and time. She appears well-developed and well-nourished.  HENT:  Head: Normocephalic and atraumatic.  Right Ear: External ear normal.  Left Ear: External ear normal.  Eyes: Conjunctivae are normal. Pupils are equal, round, and reactive to light.  Neck: Normal range of motion. Neck supple. No thyromegaly present.  Cardiovascular: Normal rate, regular rhythm and normal heart sounds.   Pulmonary/Chest: Effort normal and breath sounds normal.  Abdominal: Soft. Bowel sounds are normal.  Musculoskeletal: She exhibits edema and tenderness.  Has Right wrist cast  Neurological: She is alert and oriented to person, place, and time.  Skin: Skin is warm and dry.  Psychiatric: She has a normal mood and affect. Her behavior is normal. Judgment and thought content normal.  Sad affect   LABS: 09/04/12 C-Difficile toxin - negative 09/03/12 wbc 4.8 hemoglobin 11.0 hematocrit 32.5 sodium 139 potassium 3.6 glucose 100 BUN 8 creatinine 0.49 calcium 8.5 09/02/12  urine culture shows greater than equal to 100,000 colonies per mL Klebsiella pneumonia a 08/21/12  tsh 0.476  hgbA1c 5.7 08/13/12  Wbc 6.9  hgb 10.1  hct 30.6  Bmp nl     Medications reviewed.   Assessment & Plan:   Bilateral  lower extremity edema - portable Doppler ultrasound on bilateral lower extremity

## 2012-10-30 ENCOUNTER — Non-Acute Institutional Stay (SKILLED_NURSING_FACILITY): Payer: Medicare Other | Admitting: Adult Health

## 2012-10-30 DIAGNOSIS — R609 Edema, unspecified: Secondary | ICD-10-CM

## 2012-10-30 DIAGNOSIS — I10 Essential (primary) hypertension: Secondary | ICD-10-CM

## 2012-10-30 DIAGNOSIS — R600 Localized edema: Secondary | ICD-10-CM

## 2012-11-05 ENCOUNTER — Non-Acute Institutional Stay (SKILLED_NURSING_FACILITY): Payer: Medicare Other | Admitting: Adult Health

## 2012-11-05 DIAGNOSIS — E039 Hypothyroidism, unspecified: Secondary | ICD-10-CM

## 2012-11-05 DIAGNOSIS — S62101D Fracture of unspecified carpal bone, right wrist, subsequent encounter for fracture with routine healing: Secondary | ICD-10-CM

## 2012-11-05 DIAGNOSIS — IMO0001 Reserved for inherently not codable concepts without codable children: Secondary | ICD-10-CM

## 2012-11-05 DIAGNOSIS — S8290XD Unspecified fracture of unspecified lower leg, subsequent encounter for closed fracture with routine healing: Secondary | ICD-10-CM

## 2012-11-05 DIAGNOSIS — S82892D Other fracture of left lower leg, subsequent encounter for closed fracture with routine healing: Secondary | ICD-10-CM

## 2012-11-05 DIAGNOSIS — R609 Edema, unspecified: Secondary | ICD-10-CM

## 2012-11-05 DIAGNOSIS — F329 Major depressive disorder, single episode, unspecified: Secondary | ICD-10-CM

## 2012-11-05 DIAGNOSIS — G629 Polyneuropathy, unspecified: Secondary | ICD-10-CM

## 2012-11-05 DIAGNOSIS — I4891 Unspecified atrial fibrillation: Secondary | ICD-10-CM

## 2012-11-05 DIAGNOSIS — I10 Essential (primary) hypertension: Secondary | ICD-10-CM

## 2012-11-05 DIAGNOSIS — R600 Localized edema: Secondary | ICD-10-CM

## 2012-11-05 DIAGNOSIS — G589 Mononeuropathy, unspecified: Secondary | ICD-10-CM

## 2012-11-05 DIAGNOSIS — S065X9A Traumatic subdural hemorrhage with loss of consciousness of unspecified duration, initial encounter: Secondary | ICD-10-CM

## 2012-11-05 DIAGNOSIS — I6200 Nontraumatic subdural hemorrhage, unspecified: Secondary | ICD-10-CM

## 2012-11-20 ENCOUNTER — Ambulatory Visit: Payer: Medicare Other | Admitting: Internal Medicine

## 2012-11-20 DIAGNOSIS — Z0289 Encounter for other administrative examinations: Secondary | ICD-10-CM

## 2012-11-20 NOTE — Progress Notes (Signed)
08/11/12 0955  OT G-codes **NOT FOR INPATIENT CLASS**  Functional Assessment Tool Used clinical observation/judgment  Functional Limitation Self care  Self Care Current Status (Z6109) CN  Self Care Goal Status (U0454) CI  Marica Otter, OTR/L [phone] 11/20/2012 for Lise Auer, OTR

## 2012-11-22 ENCOUNTER — Encounter: Payer: Self-pay | Admitting: Adult Health

## 2012-11-22 NOTE — Progress Notes (Signed)
Patient ID: Meredith Gonzalez, female   DOB: 1936/02/19, 77 y.o.   MRN: [id number]       PROGRESS NOTE  DATE: 11/05/2012   FACILITY: [facility]  LEVEL OF CARE: SNF (31)    CHIEF COMPLAINT:  Discharge Visit  HISTORY OF PRESENT ILLNESS:This isa 77 year old female who is for discharge home with home health PT, OT and nursing. She has been admitted to [facility] on 08/12/12. She was having recurrent falls due to lack of balance and recurrent syncopal episodes. Late this fall left paramedian subdural hematoma, a fractured left knee and right wrist. The right at least based and left knee immobilizer were discontinued recently. Patient was admitted to this facility for short-term rehabilitation after the patient's recent hospitalization.  Patient has completed SNF rehabilitation and therapy has cleared the patient for discharge.  Reassessment of ongoing problem(s):  HTN: Pt 's HTN remains stable.  Denies CP, sob, DOE, pedal edema, headaches, dizziness or visual disturbances.  No complications from the medications currently being used.  Last BP :121/77  HYPOTHYROIDISM: The hypothyroidism remains stable. No complications noted from the medications presently being used.  The patient denies fatigue or constipation.  5/14 TSH 0.476  DEPRESSION: The depression remains stable. Patient denies ongoing feelings of sadness, insomnia, anedhonia or lack of appetite. No complications reported from the medications currently being used. Staff do not report behavioral problems.   PAST MEDICAL HISTORY : Reviewed.  No changes.  CURRENT MEDICATIONS: Reviewed per [facility]  REVIEW OF SYSTEMS:  GENERAL: no change in appetite, no fatigue, no weight changes, no fever, chills or weakness RESPIRATORY: no cough, SOB, DOE, wheezing, hemoptysis CARDIAC: no chest pain, or palpitations, +edema GI: no abdominal pain, diarrhea, constipation, heart burn, nausea or vomiting  PHYSICAL EXAMINATION  VS:  T97.8        P77       RR20      BP1/77      POX95 %       WT142.4 (Lb)  GENERAL: no acute distress, normal body habitus EYES: conjunctivae normal, sclerae normal, normal eye lids NECK: supple, trachea midline, no neck masses, no thyroid tenderness, no thyromegaly LYMPHATICS: no LAN in the neck, no supraclavicular LAN RESPIRATORY: breathing is even & unlabored, BS CTAB CARDIAC: RRR, no murmur,no extra heart sounds, BLE edema 2+ GI: abdomen soft, normal BS, no masses, no tenderness, no hepatomegaly, no splenomegaly PSYCHIATRIC: the patient is alert & oriented to person, affect & behavior appropriate  LABS/RADIOLOGY: 09/04/12 C-Difficile toxin - negative 09/03/12 wbc 4.8 hemoglobin 11.0 hematocrit 32.5 sodium 139 potassium 3.6 glucose 100 BUN 8 creatinine 0.49 calcium 8.5 09/02/12  urine culture shows greater than equal to 100,000 colonies per mL Klebsiella pneumonia a 08/21/12  tsh 0.476  hgbA1c 5.7 08/13/12  Wbc 6.9  hgb 10.1  hct 30.6  Bmp nl  ASSESSMENT/PLAN:  Subdural hematoma - follow-up with Dr. Maeola Harman, neurosurgery, outpatient  Left knee fracture - for home health PT, OT and nursing  Right wrist fracture - for home health rehabilitation  Bilateral lower extremity edema - continue Lasix  Hypertension - well controlled  Hypothyroidism - well controlled  Atrial fibrillation with pacemaker - rate controlled  Neuropathy - no complaints   I have filled out patient's discharge paperwork and written prescriptions.  Patient will receive home health PT, OT and Nursing   Total discharge time: Less than 30 minutes Discharge time involved coordination of the discharge process with Child psychotherapist, nursing staff and  therapy department. Medical justification for home health services verified.  CPT CODE: 28413

## 2012-11-22 NOTE — Progress Notes (Signed)
Patient ID: Meredith Gonzalez, female   DOB: November 03, 1935, 77 y.o.   MRN: [id number]       PROGRESS NOTE  DATE: 10/30/2012  FACILITY:  [facility]  LEVEL OF CARE: SNF (31)  Acute Visit  CHIEF COMPLAINT:  Manage  Bilateral lower extremity edema  HISTORY OF PRESENT ILLNESS:  This is a 77 year old female who is noted to have bilateral lower extremity edema, 2+. Legs are hard to touch but not tender. Patient is currently taking Norvasc 10 mg by mouth daily for hypertension.  PAST MEDICAL HISTORY : Reviewed.  No changes.  CURRENT MEDICATIONS: Reviewed per [facility]  REVIEW OF SYSTEMS:  GENERAL: no change in appetite, no fatigue, no weight changes, no fever, chills or weakness RESPIRATORY: no cough, SOB, DOE,, wheezing, hemoptysis CARDIAC: no chest pain, or palpitations, +edema GI: no abdominal pain, diarrhea, constipation, heart burn, nausea or vomiting  PHYSICAL EXAMINATION  VS:  T97.5        P74       RR20       BP130/69      POX99 %       WT142.4 (Lb)  GENERAL: no acute distress, normal body habitus EYES: conjunctivae normal, sclerae normal, normal eye lids NECK: supple, trachea midline, no neck masses, no thyroid tenderness, no thyromegaly LYMPHATICS: no LAN in the neck, no supraclavicular LAN RESPIRATORY: breathing is even & unlabored, BS CTAB CARDIAC: RRR, no murmur,no extra heart sounds, BLE edema, 2 + GI: abdomen soft, normal BS, no masses, no tenderness, no hepatomegaly, no splenomegaly PSYCHIATRIC: the patient is alert & oriented to person, affect & behavior appropriate  LABS/RADIOLOGY: 09/04/12 C-Difficile toxin - negative 09/03/12 wbc 4.8 hemoglobin 11.0 hematocrit 32.5 sodium 139 potassium 3.6 glucose 100 BUN 8 creatinine 0.49 calcium 8.5 09/02/12  urine culture shows greater than equal to 100,000 colonies per mL Klebsiella pneumonia a 08/21/12  tsh 0.476  hgbA1c 5.7 08/13/12  Wbc 6.9  hgb 10.1  hct 30.6  Bmp nl   ASSESSMENT/PLAN:  Bilateral lower extremity  edema - Start Lasix 20 mg one tab by mouth daily; weight weekly x1 month  Hypertension - decrease Norvasc to 5 mg one by mouth daily; BP every shift x1 week   CPT CODE: 40981

## 2012-11-25 NOTE — Progress Notes (Signed)
08/12/12 1100  OT G-codes **NOT FOR INPATIENT CLASS**  Functional Assessment Tool Used clinical observation/judgment  Functional Limitation Self care  Self Care Goal Status (A2130) CI  Self Care Discharge Status [phone]) CM

## 2012-12-07 ENCOUNTER — Encounter: Payer: Self-pay | Admitting: Internal Medicine

## 2012-12-07 ENCOUNTER — Ambulatory Visit (INDEPENDENT_AMBULATORY_CARE_PROVIDER_SITE_OTHER): Payer: Medicare Other | Admitting: Internal Medicine

## 2012-12-07 VITALS — BP 116/56 | HR 68 | Ht 68.0 in | Wt 138.0 lb

## 2012-12-07 DIAGNOSIS — R55 Syncope and collapse: Secondary | ICD-10-CM

## 2012-12-07 LAB — CBC WITH DIFFERENTIAL/PLATELET
Basophils Relative: 0.2 % (ref 0.0–3.0)
Eosinophils Absolute: 0 10*3/uL (ref 0.0–0.7)
Hemoglobin: 11.1 g/dL — ABNORMAL LOW (ref 12.0–15.0)
Lymphocytes Relative: 17.9 % (ref 12.0–46.0)
MCHC: 33.2 g/dL (ref 30.0–36.0)
Monocytes Relative: 6.9 % (ref 3.0–12.0)
Neutro Abs: 6.3 10*3/uL (ref 1.4–7.7)
Neutrophils Relative %: 74.7 % (ref 43.0–77.0)
RBC: 3.6 Mil/uL — ABNORMAL LOW (ref 3.87–5.11)
WBC: 8.4 10*3/uL (ref 4.5–10.5)

## 2012-12-07 LAB — BASIC METABOLIC PANEL
BUN: 13 mg/dL (ref 6–23)
CO2: 29 mEq/L (ref 19–32)
Chloride: 103 mEq/L (ref 96–112)
Creatinine, Ser: 0.7 mg/dL (ref 0.4–1.2)
Potassium: 4.5 mEq/L (ref 3.5–5.1)
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
## 2012-12-07 NOTE — Patient Instructions (Addendum)
Will obtain labs today and call you with the results (cbc/bmet)  Will arrange for you to return soon with device clinic (new patient)  STOP XARELTO  DECREASE YOUR AMLODIPINE TO 2.5 MG DAILY

## 2012-12-21 ENCOUNTER — Telehealth: Payer: Self-pay | Admitting: Internal Medicine

## 2012-12-21 NOTE — Telephone Encounter (Signed)
Spoke to patient she wanted to know lab results and wanted to know if Dr.Ross has reviewed her records from [place].Message sent to Dr.Ross.

## 2012-12-21 NOTE — Telephone Encounter (Signed)
Returned call to patient no answer.LMTC. 

## 2012-12-21 NOTE — Telephone Encounter (Signed)
New problem     lab results & what did Dr. Tenny Craw decided to do.

## 2012-12-31 ENCOUNTER — Encounter: Payer: Self-pay | Admitting: Internal Medicine

## 2012-12-31 ENCOUNTER — Ambulatory Visit (INDEPENDENT_AMBULATORY_CARE_PROVIDER_SITE_OTHER): Payer: Medicare Other | Admitting: Internal Medicine

## 2012-12-31 ENCOUNTER — Encounter: Payer: Medicare Other | Admitting: *Deleted

## 2012-12-31 ENCOUNTER — Encounter: Payer: Self-pay | Admitting: *Deleted

## 2012-12-31 DIAGNOSIS — T82198A Other mechanical complication of other cardiac electronic device, initial encounter: Secondary | ICD-10-CM

## 2012-12-31 DIAGNOSIS — T82190A Other mechanical complication of cardiac electrode, initial encounter: Secondary | ICD-10-CM

## 2012-12-31 DIAGNOSIS — I4891 Unspecified atrial fibrillation: Secondary | ICD-10-CM

## 2012-12-31 DIAGNOSIS — T82111A Breakdown (mechanical) of cardiac pulse generator (battery), initial encounter: Secondary | ICD-10-CM | POA: Insufficient documentation

## 2012-12-31 LAB — BASIC METABOLIC PANEL
BUN: 12 mg/dL (ref 6–23)
CO2: 28 mEq/L (ref 19–32)
Calcium: 9.1 mg/dL (ref 8.4–10.5)
GFR: 103.48 mL/min (ref >60.00)
Glucose, Bld: 91 mg/dL (ref 70–99)
Potassium: 3.9 mEq/L (ref 3.5–5.1)
Sodium: 142 mEq/L (ref 135–145)

## 2012-12-31 LAB — CBC WITH DIFFERENTIAL/PLATELET
Basophils Absolute: 0 10*3/uL (ref 0.0–0.1)
Eosinophils Absolute: 0 10*3/uL (ref 0.0–0.7)
HCT: 32.4 % — ABNORMAL LOW (ref 36.0–46.0)
Hemoglobin: 10.7 g/dL — ABNORMAL LOW (ref 12.0–15.0)
Lymphs Abs: 1.6 10*3/uL (ref 0.7–4.0)
MCHC: 33 g/dL (ref 30.0–36.0)
Neutro Abs: 3.5 10*3/uL (ref 1.4–7.7)
Platelets: 284 10*3/uL (ref 150.0–400.0)
RDW: 14.3 % (ref 11.5–14.6)

## 2012-12-31 LAB — PACEMAKER DEVICE OBSERVATION
AL AMPLITUDE: 1.9 mv
AL IMPEDENCE PM: 643 Ohm
AL THRESHOLD: 0.6 V
BRDY-0002RA: 60 {beats}/min
RV LEAD AMPLITUDE: 9.1 mv

## 2012-12-31 MED ORDER — CEFAZOLIN SODIUM-DEXTROSE 2-3 GM-% IV SOLR
2.0000 g | INTRAVENOUS | Status: DC
  Filled 2012-12-31: qty 50

## 2012-12-31 MED ORDER — GENTAMICIN SULFATE 40 MG/ML IJ SOLN
80.0000 mg | INTRAMUSCULAR | Status: DC
  Filled 2012-12-31: qty 2

## 2012-12-31 MED ORDER — CHLORHEXIDINE GLUCONATE 4 % EX LIQD
60.0000 mL | Freq: Once | CUTANEOUS | Status: DC
  Filled 2012-12-31: qty 60

## 2012-12-31 MED ORDER — SODIUM CHLORIDE 0.9 % IV SOLN
INTRAVENOUS | Status: DC
  Administered 2013-01-01: 07:00:00 via INTRAVENOUS

## 2012-12-31 NOTE — Progress Notes (Signed)
HPI Meredith Gonzalez is seen today for an unscheduled visit for evaluation of a broken pacemaker lead. The patient is a very pleasant 77 year old woman who has a history of intermittent complete heart block, status post permanent pacemaker insertion, who has recently moved to [place] to be closer to family. She suffered a broken hip several months ago and had surgical repair. The patient underwent permanent pacemaker insertion several years ago, with a Biotronik dual-chamber device placed. We do not have these records. She was suddenly found to have right ventricular lead dysfunction with an elevated pacing impedance and high capture thresholds and presents today for additional evaluation. Allergies  Allergen Reactions  . Valium [Diazepam]     Makes patient hyper  . Codeine Hives and Itching    Can take with Benadryl  . Darvon [Propoxyphene Hcl] Itching    Can take with Benadryl  . Daypro [Oxaprozin] Itching    Can take with Benadryl  . Enablex [Darifenacin Hydrobromide Er] Itching    Can take with Benadryl  . Oxycodone Itching    Can take with Benadryl  . Risperdal [Risperidone] Itching    Can take with Benadryl  . Talwin [Pentazocine] Itching    Can take with Benadryl  . Vicodin [Hydrocodone-Acetaminophen] Itching    Can take with Benadryl     Current Outpatient Prescriptions  Medication Sig Dispense Refill  . acetaminophen (TYLENOL) 500 MG tablet Take 500 mg by mouth every 6 (six) hours as needed for pain.      Marland Kitchen ALPRAZolam (XANAX) 0.25 MG tablet Take 1 tablet  4 times a day as needed for anxiety  180 tablet  0  . amLODipine (NORVASC) 5 MG tablet Take 5 mg by mouth as directed. 1/2 TABLET DAILY      . B Complex Vitamins (B-COMPLEX/B-12 PO) Take by mouth.      . diltiazem (CARDIZEM LA) 240 MG 24 hr tablet Take 240 mg by mouth daily.      . diphenhydrAMINE (BENADRYL) 25 mg capsule Take 25 mg by mouth every 6 (six) hours as needed for itching.      Marland Kitchen FLUoxetine (PROZAC) 20 MG  capsule Take 60 mg by mouth daily.      . folic acid (FOLVITE) 1 MG tablet Take 1 mg by mouth daily.      . furosemide (LASIX) 20 MG tablet Take 20 mg by mouth daily.      Marland Kitchen gabapentin (NEURONTIN) 300 MG capsule Take 300 mg by mouth at bedtime.       Marland Kitchen levothyroxine (SYNTHROID, LEVOTHROID) 100 MCG tablet Take 100 mcg by mouth daily.       No current facility-administered medications for this visit.     Past Medical History  Diagnosis Date  . Hypothyroidism   . Hyperthyroidism   . Depression   . Diabetes mellitus without complication   . Pacemaker   . Stroke 2000  . Headache(784.0)   . Cancer     basal cell skin cancers  . Arthritis   . Anemia   . Incontinence of bowel   . Incontinence of urine     ROS:   All systems reviewed and negative except as noted in the HPI.   Past Surgical History  Procedure Laterality Date  . Pacemaker insertion    . Basal cell cancer removal    . Abdominal hysterectomy    . Cholecystectomy    . Femur fracture surgery      bilateral  .  Right ankle surgery    . Carpal tunnel release      bilateral  . Gastric bypass    . Eye surgery      right eye catarace/lens implant  . Colonoscopy       Family History  Problem Relation Age of Onset  . Congestive Heart Failure Mother   . Heart attack Father   . Alcoholism Father   . Alcoholism Sister   . Alcoholism Brother   . Alcoholism Brother   . Throat cancer Brother      History   Social History  . Marital Status: Single    Spouse Name: N/A    Number of Children: N/A  . Years of Education: N/A   Occupational History  . Not on file.   Social History Main Topics  . Smoking status: Former Smoker -- 0.50 packs/day for 4 years    Types: Cigarettes    Quit date: 08/07/1972  . Smokeless tobacco: Never Used  . Alcohol Use: Yes     Comment: wine cooler once or twice per month  . Drug Use: No  . Sexual Activity: Not on file   Other Topics Concern  . Not on file   Social History  Narrative  . No narrative on file     Blood pressure 113/70 pulse 68 and regular respirations 18 Physical Exam:  Well appearing 77 year old woman, NAD HEENT: Unremarkable Neck:  No JVD, no thyromegally Back:  No CVA tenderness Lungs:  Clear with no wheezes, rales, or rhonchi. HEART:  Regular rate rhythm, no murmurs, no rubs, no clicks Abd:  soft, positive bowel sounds, no organomegally, no rebound, no guarding Ext:  2 plus pulses, no edema, no cyanosis, no clubbing Skin:  No rashes no nodules Neuro:  CN II through XII intact, motor grossly intact  EKG - normal sinus rhythm  DEVICE  Right ventricular lead despite catheter, and has an elevated pacing impedance both unipolar and bipolar, greater than 2600 ohms..  See PaceArt for details.   Assess/Plan:

## 2012-12-31 NOTE — Progress Notes (Signed)
Pacemaker check in clinic. Normal device function. Device programmed to maximize longevity.  No ventricular capture unipolar or bipolar.   No mode switch or high ventricular rates noted. Device programmed at appropriate safety margins. Histogram distribution appropriate for patient activity level. Device programmed to optimize intrinsic conduction. Estimated longevity 10.3 years.  Patient education completed.  Patient scheduled for RV lead revision.

## 2012-12-31 NOTE — Assessment & Plan Note (Signed)
The patient has been found to have a right ventricular lead which is no longer working. I am worried that her episode of syncope several months ago was caused by transient complete heart block with inability to capture the ventricle. For this reason, I recommended the patient present tomorrow for pacemaker lead revision. We will check the connection between the right ventricular lead and the pacemaker generator. If there is no problem with this connection, we will plan to place a new right ventricular lead. I discussed the risk, goals, benefits, and expectations of the procedure with the patient and she wishes to proceed.

## 2012-12-31 NOTE — Patient Instructions (Signed)
See instruction sheet for lead revision

## 2012-12-31 NOTE — Assessment & Plan Note (Signed)
She is currently maintaining sinus rhythm, and her atrial fibrillation appears to be well-controlled.

## 2013-01-01 ENCOUNTER — Encounter (HOSPITAL_COMMUNITY)
Admission: RE | Disposition: A | Discharge status: Home / Self Care | Payer: Self-pay | Source: Ambulatory Visit | Attending: Internal Medicine

## 2013-01-01 ENCOUNTER — Ambulatory Visit (HOSPITAL_COMMUNITY)
Admission: RE | Admit: 2013-01-01 | Discharge: 2013-01-02 | Disposition: A | Discharge status: Home / Self Care | Payer: Medicare Other | Source: Ambulatory Visit | Attending: Internal Medicine | Admitting: Internal Medicine

## 2013-01-01 ENCOUNTER — Encounter (HOSPITAL_COMMUNITY): Payer: Self-pay | Admitting: Emergency Medicine

## 2013-01-01 DIAGNOSIS — I495 Sick sinus syndrome: Secondary | ICD-10-CM | POA: Insufficient documentation

## 2013-01-01 DIAGNOSIS — Z950 Presence of cardiac pacemaker: Secondary | ICD-10-CM | POA: Insufficient documentation

## 2013-01-01 DIAGNOSIS — T82198A Other mechanical complication of other cardiac electronic device, initial encounter: Secondary | ICD-10-CM

## 2013-01-01 DIAGNOSIS — Z951 Presence of aortocoronary bypass graft: Secondary | ICD-10-CM | POA: Insufficient documentation

## 2013-01-01 DIAGNOSIS — I10 Essential (primary) hypertension: Secondary | ICD-10-CM | POA: Insufficient documentation

## 2013-01-01 DIAGNOSIS — T82190A Other mechanical complication of cardiac electrode, initial encounter: Secondary | ICD-10-CM

## 2013-01-01 DIAGNOSIS — I4891 Unspecified atrial fibrillation: Secondary | ICD-10-CM | POA: Insufficient documentation

## 2013-01-01 DIAGNOSIS — Z9181 History of falling: Secondary | ICD-10-CM | POA: Insufficient documentation

## 2013-01-01 DIAGNOSIS — Y831 Surgical operation with implant of artificial internal device as the cause of abnormal reaction of the patient, or of later complication, without mention of misadventure at the time of the procedure: Secondary | ICD-10-CM | POA: Insufficient documentation

## 2013-01-01 DIAGNOSIS — I2510 Atherosclerotic heart disease of native coronary artery without angina pectoris: Secondary | ICD-10-CM | POA: Insufficient documentation

## 2013-01-01 HISTORY — PX: LEAD REVISION: SHX5945

## 2013-01-01 HISTORY — DX: Fibromyalgia: M79.7

## 2013-01-01 LAB — GLUCOSE, CAPILLARY
Glucose-Capillary: 101 mg/dL — ABNORMAL HIGH (ref 70–99)
Glucose-Capillary: 120 mg/dL — ABNORMAL HIGH (ref 70–99)

## 2013-01-01 LAB — SURGICAL PCR SCREEN: Staphylococcus aureus: NEGATIVE

## 2013-01-01 SURGERY — LEAD REVISION
Anesthesia: LOCAL

## 2013-01-01 MED ORDER — GABAPENTIN 300 MG PO CAPS
300.0000 mg | ORAL_CAPSULE | Freq: Every day | ORAL | Status: DC
  Administered 2013-01-01: 300 mg via ORAL
  Filled 2013-01-01 (×2): qty 1

## 2013-01-01 MED ORDER — DILTIAZEM HCL ER COATED BEADS 240 MG PO TB24
240.0000 mg | ORAL_TABLET | Freq: Every day | ORAL | Status: DC
  Administered 2013-01-01: 240 mg via ORAL
  Filled 2013-01-01 (×2): qty 1

## 2013-01-01 MED ORDER — FLUOXETINE HCL 20 MG PO CAPS
60.0000 mg | ORAL_CAPSULE | Freq: Every day | ORAL | Status: DC
  Filled 2013-01-01: qty 3

## 2013-01-01 MED ORDER — DIPHENHYDRAMINE HCL 25 MG PO CAPS: 25.0000 mg | ORAL_CAPSULE | Freq: Four times a day (QID) | ORAL | Status: DC | PRN

## 2013-01-01 MED ORDER — FENTANYL CITRATE 0.05 MG/ML IJ SOLN
INTRAMUSCULAR | Status: AC
  Filled 2013-01-01: qty 2

## 2013-01-01 MED ORDER — MIDAZOLAM HCL 5 MG/5ML IJ SOLN
INTRAMUSCULAR | Status: AC
  Filled 2013-01-01: qty 5

## 2013-01-01 MED ORDER — FENTANYL CITRATE 0.05 MG/ML IJ SOLN: 25.0000 ug | INTRAMUSCULAR | Status: DC | PRN

## 2013-01-01 MED ORDER — CEFAZOLIN SODIUM 1-5 GM-% IV SOLN
1.0000 g | Freq: Four times a day (QID) | INTRAVENOUS | Status: AC
  Administered 2013-01-01 (×3): 1 g via INTRAVENOUS
  Filled 2013-01-01 (×3): qty 50

## 2013-01-01 MED ORDER — LIDOCAINE HCL (PF) 1 % IJ SOLN
INTRAMUSCULAR | Status: AC
  Filled 2013-01-01: qty 60

## 2013-01-01 MED ORDER — FOLIC ACID 1 MG PO TABS
1.0000 mg | ORAL_TABLET | Freq: Every day | ORAL | Status: DC
  Filled 2013-01-01: qty 1

## 2013-01-01 MED ORDER — ONDANSETRON HCL 4 MG/2ML IJ SOLN: 4.0000 mg | Freq: Four times a day (QID) | INTRAMUSCULAR | Status: DC | PRN

## 2013-01-01 MED ORDER — LEVOTHYROXINE SODIUM 100 MCG PO TABS
100.0000 ug | ORAL_TABLET | Freq: Every day | ORAL | Status: DC
  Administered 2013-01-02: 100 ug via ORAL
  Filled 2013-01-01 (×2): qty 1

## 2013-01-01 MED ORDER — MUPIROCIN 2 % EX OINT
TOPICAL_OINTMENT | Freq: Two times a day (BID) | CUTANEOUS | Status: DC
  Filled 2013-01-01: qty 22

## 2013-01-01 MED ORDER — INFLUENZA VAC SPLIT QUAD 0.5 ML IM SUSP
0.5000 mL | INTRAMUSCULAR | Status: DC
  Filled 2013-01-01: qty 0.5

## 2013-01-01 MED ORDER — ACETAMINOPHEN 325 MG PO TABS
650.0000 mg | ORAL_TABLET | Freq: Four times a day (QID) | ORAL | Status: DC | PRN
  Administered 2013-01-01: 650 mg via ORAL
  Filled 2013-01-01: qty 2

## 2013-01-01 MED ORDER — AMLODIPINE BESYLATE 2.5 MG PO TABS
2.5000 mg | ORAL_TABLET | Freq: Every day | ORAL | Status: DC
  Filled 2013-01-01: qty 1

## 2013-01-01 MED ORDER — FUROSEMIDE 20 MG PO TABS
20.0000 mg | ORAL_TABLET | Freq: Every day | ORAL | Status: DC
  Filled 2013-01-01 (×2): qty 1

## 2013-01-01 NOTE — Progress Notes (Signed)
UR Completed Elizah Mierzwa Graves-Bigelow, RN,BSN [phone]  

## 2013-01-01 NOTE — Discharge Summary (Signed)
ELECTROPHYSIOLOGY DISCHARGE SUMMARY    Patient ID: Meredith Gonzalez,  MRN: [id number], DOB/AGE: May 01, 1935 77 y.o.  Admit date: 01/01/2013 Discharge date: 01/02/2013  Primary Care Physician: None listed in EPIC Primary Cardiologist / EP: Dietrich Pates, MD / Lewayne Bunting, MD  Primary Discharge Diagnosis:  1. RV lead fracture s/p RV lead revision  Secondary Discharge Diagnoses:  1. Bradycardia s/p PPM implant 2 years ago (Biotronik) 2. Atrial fibrillation 3. CAD s/p CABG 4. HTN 5. History of falls with resultant hip fracture and SDH  Procedures This Admission:  1. PPM RV lead revision, pocket revision with left upper extremity venography without immediate complication.  History and Hospital Course:  Meredith Gonzalez is a 77 year old woman with CAD s/p CABG, HTN, hypothyroidism, atrial fibrillation and bradycardia s/p PPM implant 2 years ago who recently moved to [place] to be closer to family. She was found to have RV lead fracture and therefore presented yesterday for RV lead revision. Meredith Gonzalez tolerated this procedure well without any immediate complication. She remains hemodynamically stable and afebrile. Her chest xray shows stable lead placement without pneumothorax. Her device interrogation shows normal PPM function with stable lead parameters/measurements. Her implant site is intact without significant bleeding or hematoma. She has been given discharge instructions including wound care and activity restrictions. She will follow-up in 10 days for wound check. There were no changes made to her medications. She has been seen, examined and deemed stable for discharge today by Dr. Graciela Husbands.    Discharge Vitals: Blood pressure 160/58, pulse 84, temperature 98.2 F (36.8 C), temperature source Oral, resp. rate 16, height 5\' 8"  (1.727 m), weight 132 lb (59.875 kg), SpO2 96.00%.   Labs: Lab Results  Component Value Date   WBC 5.7 12/31/2012   HGB 10.7* 12/31/2012   HCT 32.4* 12/31/2012   MCV 93.6 12/31/2012     PLT 284.0 12/31/2012     Recent Labs Lab 12/31/12 1245  NA 142  K 3.9  CL 103  CO2 28  BUN 12  CREATININE 0.6  CALCIUM 9.1  GLUCOSE 91    Disposition:  The patient is being discharged in stable condition.  Follow-up:     Follow-up Information   Follow up with [facility] On 01/13/2013. (At 11:00 AM for wound check)    Specialty:  Cardiology   Contact information:   [address] [place] [address] [phone]      Follow up with Lewayne Bunting, MD In 3 months. (Our office will mail letter)    Specialty:  Cardiology   Contact information:   [address] [place] [address] [phone]      Discharge Medications:    Medication List         acetaminophen 500 MG tablet  Commonly known as:  TYLENOL  Take 500 mg by mouth every 6 (six) hours as needed for pain.     ALPRAZolam 0.25 MG tablet  Commonly known as:  XANAX  Take 1 tablet  4 times a day as needed for anxiety     amLODipine 5 MG tablet  Commonly known as:  NORVASC  Take 2.5 mg by mouth daily.     B-COMPLEX/B-12 PO  Take by mouth.     diltiazem 240 MG 24 hr tablet  Commonly known as:  CARDIZEM LA  Take 240 mg by mouth daily.     diphenhydrAMINE 25 mg capsule  Commonly known as:  BENADRYL  Take 25 mg by mouth every 6 (six) hours as needed for itching.     FLUoxetine 20 MG capsule  Commonly known as:  PROZAC  Take 60 mg by mouth daily.     folic acid 1 MG tablet  Commonly known as:  FOLVITE  Take 1 mg by mouth daily.     furosemide 20 MG tablet  Commonly known as:  LASIX  Take 20 mg by mouth daily.     gabapentin 300 MG capsule  Commonly known as:  NEURONTIN  Take 300 mg by mouth at bedtime.     levothyroxine 100 MCG tablet  Commonly known as:  SYNTHROID, LEVOTHROID  Take 100 mcg by mouth daily.       Duration of Discharge Encounter: Greater than 30 minutes including physician time.  Signed, Theodore Demark,  PA-C 01/02/2013, 11:06 AM

## 2013-01-01 NOTE — H&P (View-Only) (Signed)
    HPI [name] is seen today for an unscheduled visit for evaluation of a broken pacemaker lead. The patient is a very pleasant 77-year-old woman who has a history of intermittent complete heart block, status post permanent pacemaker insertion, who has recently moved to [place] to be closer to family. She suffered a broken hip several months ago and had surgical repair. The patient underwent permanent pacemaker insertion several years ago, with a Biotronik dual-chamber device placed. We do not have these records. She was suddenly found to have right ventricular lead dysfunction with an elevated pacing impedance and high capture thresholds and presents today for additional evaluation. Allergies  Allergen Reactions  . Valium [Diazepam]     Makes patient hyper  . Codeine Hives and Itching    Can take with Benadryl  . Darvon [Propoxyphene Hcl] Itching    Can take with Benadryl  . Daypro [Oxaprozin] Itching    Can take with Benadryl  . Enablex [Darifenacin Hydrobromide Er] Itching    Can take with Benadryl  . Oxycodone Itching    Can take with Benadryl  . Risperdal [Risperidone] Itching    Can take with Benadryl  . Talwin [Pentazocine] Itching    Can take with Benadryl  . Vicodin [Hydrocodone-Acetaminophen] Itching    Can take with Benadryl     Current Outpatient Prescriptions  Medication Sig Dispense Refill  . acetaminophen (TYLENOL) 500 MG tablet Take 500 mg by mouth every 6 (six) hours as needed for pain.      . ALPRAZolam (XANAX) 0.25 MG tablet Take 1 tablet  4 times a day as needed for anxiety  180 tablet  0  . amLODipine (NORVASC) 5 MG tablet Take 5 mg by mouth as directed. 1/2 TABLET DAILY      . B Complex Vitamins (B-COMPLEX/B-12 PO) Take by mouth.      . diltiazem (CARDIZEM LA) 240 MG 24 hr tablet Take 240 mg by mouth daily.      . diphenhydrAMINE (BENADRYL) 25 mg capsule Take 25 mg by mouth every 6 (six) hours as needed for itching.      . FLUoxetine (PROZAC) 20 MG  capsule Take 60 mg by mouth daily.      . folic acid (FOLVITE) 1 MG tablet Take 1 mg by mouth daily.      . furosemide (LASIX) 20 MG tablet Take 20 mg by mouth daily.      . gabapentin (NEURONTIN) 300 MG capsule Take 300 mg by mouth at bedtime.       . levothyroxine (SYNTHROID, LEVOTHROID) 100 MCG tablet Take 100 mcg by mouth daily.       No current facility-administered medications for this visit.     Past Medical History  Diagnosis Date  . Hypothyroidism   . Hyperthyroidism   . Depression   . Diabetes mellitus without complication   . Pacemaker   . Stroke 2000  . Headache(784.0)   . Cancer     basal cell skin cancers  . Arthritis   . Anemia   . Incontinence of bowel   . Incontinence of urine     ROS:   All systems reviewed and negative except as noted in the HPI.   Past Surgical History  Procedure Laterality Date  . Pacemaker insertion    . Basal cell cancer removal    . Abdominal hysterectomy    . Cholecystectomy    . Femur fracture surgery      bilateral  .   Right ankle surgery    . Carpal tunnel release      bilateral  . Gastric bypass    . Eye surgery      right eye catarace/lens implant  . Colonoscopy       Family History  Problem Relation Age of Onset  . Congestive Heart Failure Mother   . Heart attack Father   . Alcoholism Father   . Alcoholism Sister   . Alcoholism Brother   . Alcoholism Brother   . Throat cancer Brother      History   Social History  . Marital Status: Single    Spouse Name: N/A    Number of Children: N/A  . Years of Education: N/A   Occupational History  . Not on file.   Social History Main Topics  . Smoking status: Former Smoker -- 0.50 packs/day for 4 years    Types: Cigarettes    Quit date: 08/07/1972  . Smokeless tobacco: Never Used  . Alcohol Use: Yes     Comment: wine cooler once or twice per month  . Drug Use: No  . Sexual Activity: Not on file   Other Topics Concern  . Not on file   Social History  Narrative  . No narrative on file     Blood pressure 113/70 pulse 68 and regular respirations 18 Physical Exam:  Well appearing 77-year-old woman, NAD HEENT: Unremarkable Neck:  No JVD, no thyromegally Back:  No CVA tenderness Lungs:  Clear with no wheezes, rales, or rhonchi. HEART:  Regular rate rhythm, no murmurs, no rubs, no clicks Abd:  soft, positive bowel sounds, no organomegally, no rebound, no guarding Ext:  2 plus pulses, no edema, no cyanosis, no clubbing Skin:  No rashes no nodules Neuro:  CN II through XII intact, motor grossly intact  EKG - normal sinus rhythm  DEVICE  Right ventricular lead despite catheter, and has an elevated pacing impedance both unipolar and bipolar, greater than 2600 ohms..  See PaceArt for details.   Assess/Plan: 

## 2013-01-01 NOTE — CV Procedure (Signed)
PPM lead revision, pocket revision with left upper extremity venography without immediate complication. [id number].

## 2013-01-01 NOTE — Interval H&P Note (Signed)
History and Physical Interval Note: Since prior clinic visit yesterday, no change in the history, physical exam, assessment and plan. We will add a new RV lead today as her old lead is broken and will not pace and she has a h/o syncope.   01/01/2013 7:13 AM  Meredith Gonzalez  has presented today for surgery, with the diagnosis of Broken lead  The various methods of treatment have been discussed with the patient and family. After consideration of risks, benefits and other options for treatment, the patient has consented to  Procedure(s): LEAD REVISION (N/A) as a surgical intervention .  The patient's history has been reviewed, patient examined, no change in status, stable for surgery.  I have reviewed the patient's chart and labs.  Questions were answered to the patient's satisfaction.     Leonia Reeves.D.

## 2013-01-02 ENCOUNTER — Ambulatory Visit (HOSPITAL_COMMUNITY): Payer: Medicare Other

## 2013-01-02 DIAGNOSIS — T82198A Other mechanical complication of other cardiac electronic device, initial encounter: Secondary | ICD-10-CM

## 2013-01-02 NOTE — Progress Notes (Signed)
  Patient Name: Meredith Gonzalez      SUBJECTIVE: Patient admitted with symptomatic sinus node dysfunction previously implanted pacemaker and right ventricular lead failure. She underwent the insertion yesterday.  Denies chest pain and sob  Past Medical History  Diagnosis Date  . Hypothyroidism   . Depression   . Diabetes mellitus without complication   . Pacemaker   . Stroke 2000  . Headache(784.0)   . Arthritis   . Anemia   . Incontinence of urine   . Cancer     basal cell skin cancers  . Fibromyalgia     Scheduled Meds:  Scheduled Meds: . amLODipine  2.5 mg Oral Daily  . diltiazem  240 mg Oral Daily  . FLUoxetine  60 mg Oral Daily  . folic acid  1 mg Oral Daily  . furosemide  20 mg Oral Daily  . gabapentin  300 mg Oral QHS  . influenza vac split quadrivalent PF  0.5 mL Intramuscular Tomorrow-1000  . levothyroxine  100 mcg Oral QAC breakfast   Continuous Infusions:   PHYSICAL EXAM Filed Vitals:   01/01/13 1415 01/01/13 1430 01/01/13 2300 01/02/13 0500  BP: 125/56 120/57 136/62 160/58  Pulse:   73 84  Temp:   98.2 F (36.8 C) 98.2 F (36.8 C)  TempSrc:   Oral Oral  Resp:      Height:      Weight:      SpO2:   96% 96%    Well developed and nourished in no acute distress HENT normal Clear Pocket without hematoma Regular rate and rhythm, no murmurs or gallops Abd-soft with active BS No Clubbing cyanosis edema Skin-warm and dry A & Oriented  Grossly normal sensory and motor function   TELEMETRY: Reviewed telemetry pt in *NSR   Intake/Output Summary (Last 24 hours) at 01/02/13 0907 Last data filed at 01/01/13 2200  Gross per 24 hour  Intake    240 ml  Output      4 ml  Net    236 ml    LABS: Basic Metabolic Panel:  Recent Labs Lab 12/31/12 1245  NA 142  K 3.9  CL 103  CO2 28  GLUCOSE 91  BUN 12  CREATININE 0.6  CALCIUM 9.1   Cardiac Enzymes: No results found for this basename: CKTOTAL, CKMB, CKMBINDEX, TROPONINI,  in the last 72  hours CBC:  Recent Labs Lab 12/31/12 1245  WBC 5.7  NEUTROABS 3.5  HGB 10.7*  HCT 32.4*  MCV 93.6  PLT 284.0    ASSESSMENT AND PLAN:  Stable post pacer function Ok for discharge Instructions were given   Signed, Sherryl Manges MD  01/02/2013

## 2013-01-02 NOTE — Op Note (Signed)
Meredith Gonzalez, Meredith Gonzalez NO.:  [id number]  MEDICAL RECORD NO.:  [id number]  LOCATION:  [id number]                        FACILITY:  [facility]  PHYSICIAN:  Doylene Canning. Ladona Ridgel, MD    DATE OF BIRTH:  [birth year]  DATE OF PROCEDURE:  01/01/2013 DATE OF DISCHARGE:                              OPERATIVE REPORT   PROCEDURE PERFORMED:  Pacemaker lead insertion with pacemaker pocket revision, and left upper extremity venography.  INTRODUCTION:  The patient is a 76 year old woman with a history of symptomatic bradycardia and sinus node dysfunction, status post permanent pacemaker insertion.  She was subsequently found to have nonfunction of her right ventricular lead with a pacing impedance of greater than 2600 ohms and a pacing threshold of greater than 7.5 V. This was both bipolar as well as unipolar.  She now presents for revision of her right ventricular lead.  After informed was obtained, the patient was taken to the diagnostic EP lab in a fasting state. After usual preparation and draping, intravenous fentanyl and midazolam was given for sedation.  A 30 mL of lidocaine was infiltrated into the left infraclavicular region.  A 5-cm incision was carried out over this region and electrocautery was utilized to dissect down to the fascial plane.  Initial attempts to puncture the subclavian vein were unsuccessful.  A 10 mL of contrast was injected into the left upper extremity venous system demonstrating that the vein was in fact patent, but was displaced caudally.  The vein was then successfully punctured and the Biotronik Setrox S 53 bipolar active fixation pacing lead, serial# [id number] was advanced into the right ventricle under fluoroscopic guidance.  Mapping was carried out.  At the final site, the R-waves measured 12 mV.  The pacing threshold was less than a 1 V at 0.5 milliseconds and the pacing impedance was 947 ohms.  A 10 V pacing did not stimulate the diaphragm.  With active  fixation of the lead, there was a large injury present.  With these satisfactory parameters, the lead was secured to the pectoralis fascia with a figure-of-eight silk suture.  The sewing sleeve was secured with silk suture.  At this point, to accommodate the additional lead and the old device, the pacemaker pocket was revised freeing up the fibrous scar tissue, which was fairly prevalent.  At this point, also the old device, which had been disconnected from the leads it was reconnected to the new pacing lead in the right ventricle as well the atrial pacing lead and placed back in the subcutaneous pocket.  The old nonfunctioning right ventricular lead was capped.  The pocket was irrigated with antibiotic irrigation and the incision was closed with 2-0 and 3-0 Vicryl.  Benzoin, Steri-Strips were painted on the skin, pressure dressing was applied, and the patient was returned to her room in satisfactory condition.  COMPLICATIONS:  There were no immediate procedure complications.  RESULTS:  This demonstrates successful insertion of a new active fixation of right ventricular pacing lead utilizing the left upper extremity venography and pacemaker pocket revision.     Doylene Canning. Ladona Ridgel, MD     GWT/MEDQ  D:  01/01/2013  T:  01/02/2013  Job:  [id number]  cc:   Pricilla Riffle, MD, [facility]

## 2013-01-05 ENCOUNTER — Telehealth: Payer: Self-pay | Admitting: Internal Medicine

## 2013-01-05 NOTE — Telephone Encounter (Signed)
Spoke with pt, aware dr Tenny Craw is not here today. Will forward to dr Tenny Craw

## 2013-01-05 NOTE — Telephone Encounter (Signed)
New message   Dr Tenny Craw is supposed to review her records from [place] and call her---waiting on call

## 2013-01-06 NOTE — Telephone Encounter (Signed)
Left message for pt to call.

## 2013-01-06 NOTE — Telephone Encounter (Signed)
Records I got were scant.  No tilt table results Would need to rerequest.

## 2013-01-06 NOTE — Telephone Encounter (Signed)
Spoke with pt, i will send release of info to both the hosp and pts cardiologist in [place] [facility]. She wants me to let dr Tenny Craw know she is so dizzy today she is having to hold on to something to walk. She reports it happens when she gets up from sitting. If she sits back down it will go away. She does not have a way to check her bp. Her blood sugar is 128. She is drinking plently of fluid. She avoids salt. Will forward for dr Tenny Craw review

## 2013-01-06 NOTE — Telephone Encounter (Signed)
I have not seen records from [place] yet. She was seen by Dr Ladona Ridgel who replaced lead  This may help with some of symptoms.

## 2013-01-07 NOTE — Telephone Encounter (Signed)
Left message for pt to call.

## 2013-01-07 NOTE — Telephone Encounter (Signed)
Cut amlodipine in 1/2 Really needs to have BP cuff. Waiting of records.

## 2013-01-12 NOTE — Telephone Encounter (Signed)
Spoke with pt, one week ago her PCP cut her amlodipine in 1/2. She cont to have off balance spells since then. Aware we have gotten some records but not all. Dr Tenny Craw has them to review.

## 2013-01-12 NOTE — Telephone Encounter (Signed)
Left message for pt to call.

## 2013-01-13 ENCOUNTER — Ambulatory Visit (INDEPENDENT_AMBULATORY_CARE_PROVIDER_SITE_OTHER): Payer: Medicare Other | Admitting: *Deleted

## 2013-01-13 DIAGNOSIS — Z950 Presence of cardiac pacemaker: Secondary | ICD-10-CM

## 2013-01-13 NOTE — Progress Notes (Signed)
Pt seen in device clinic for follow up of recently revised RV lead.  Wound well healed.  No redness, swelling, or edema.  Steri-strips removed today.   Device interrogated and found to be functioning normally.  No changes made today. See PaceArt for full details.  Pt has device now more lateral towards shoulder, expressing concern of location/discomfort.   Pt to follow up with Dr. Ladona Ridgel in 3 months.   Weston Anna 01/13/2013 12:33 PM

## 2013-01-14 LAB — PACEMAKER DEVICE OBSERVATION
AL AMPLITUDE: 1.8 mv
AL IMPEDENCE PM: 604 Ohm
AL THRESHOLD: 0.7 V
ATRIAL PACING PM: 35
BAMS-0001: 160 {beats}/min
BAMS-0002: 5 ms
RV LEAD AMPLITUDE: 16 mv
RV LEAD IMPEDENCE PM: 624 Ohm
RV LEAD THRESHOLD: 0.7 V
VENTRICULAR PACING PM: 0

## 2013-01-20 ENCOUNTER — Encounter: Payer: Self-pay | Admitting: Internal Medicine

## 2013-01-25 NOTE — Telephone Encounter (Signed)
DId not receive records on tilt table  I believe that was requested again.  Please verify.

## 2013-01-25 NOTE — Telephone Encounter (Signed)
New Problem  Pt ask's has Dr. Tenny Craw received records from cardiologist in [place] and if so what are her thoughts/// Pt states that she has had her records faxed over 6 weeks ago and she has yet to receive any information// please call back to discuss.

## 2013-01-25 NOTE — Telephone Encounter (Signed)
Will forward to dr/nurse 

## 2013-01-26 ENCOUNTER — Telehealth: Payer: Self-pay | Admitting: Internal Medicine

## 2013-01-26 NOTE — Telephone Encounter (Signed)
New message  Son would like a call regarding device. Said when he was in the office with his mom, Leonette Most said he would have the rep from the device company and he still hasn't heard anything.

## 2013-01-27 NOTE — Telephone Encounter (Signed)
Spoke with pt, aware we have gotten the records from the hosp but not the cardiologist. We have resent the release and hopefully will get those records soon. Patient voiced understanding

## 2013-01-27 NOTE — Telephone Encounter (Signed)
I spoke with the [name], [place], on 01/13/13. Kipp Brood said he was well aware of Ms. Sween lead revision and he would contact them directly.   I sent Kipp Brood another text today. He has forwarded the information up his chain of command.   I updated Caryn Bee (Ms. Blas son). Caryn Bee requesting Kipp Brood call him directly to update him. I sent Kipp Brood a text with Kevin's cell #.

## 2013-01-28 NOTE — Telephone Encounter (Signed)
Spoke to patient's son  I have not seen tilt table test his mother had while in [place]   We will research and put in another request for this.

## 2013-02-09 ENCOUNTER — Telehealth: Payer: Self-pay | Admitting: Internal Medicine

## 2013-02-09 NOTE — Telephone Encounter (Signed)
Returned Meredith Gonzalez's call. Updated Meredith Gonzalez that I spoke again w/ [name], [place]. Kipp Brood stated [employer] will be point of contact.  Meredith Gonzalez seeking compensation due to fall leading to nursing home rehabilitation & lead revision thereafter. Meredith Gonzalez has not been contacted yet by [employer]. Meredith Gonzalez now waiting one month for a response from [employer].

## 2013-02-09 NOTE — Telephone Encounter (Signed)
New problem     Pt's son need to speak to [facility] concerning about a defect in her his mom's device. He still haven't heard from a rep. He was very upset.

## 2013-02-10 ENCOUNTER — Telehealth: Payer: Self-pay | Admitting: Internal Medicine

## 2013-02-10 NOTE — Telephone Encounter (Signed)
ROI faxed to [facility], Tilt table test received back will hold until debra back in Office  02/10/13/km

## 2013-03-01 ENCOUNTER — Telehealth: Payer: Self-pay | Admitting: Internal Medicine

## 2013-03-01 MED ORDER — PINDOLOL 5 MG PO TABS: 5.0000 mg | ORAL_TABLET | Freq: Two times a day (BID) | ORAL | Status: DC

## 2013-03-01 NOTE — Telephone Encounter (Signed)
I will forward to Dr Tenny Craw to see if she needs me to call the pt.

## 2013-03-01 NOTE — Telephone Encounter (Signed)
Spoke with pt, aware tilt table results reviewed by dr Tenny Craw and dr taylor, pt instructed to stop amlodipine and diltiazem. She will start pindolol 5 mg bid. She will track her bp and will let us know of any dizziness. Follow up appt made for pt to see dr Tenny Craw.

## 2013-03-01 NOTE — Telephone Encounter (Signed)
New message     Saw Dr Tenny Craw over 26mo ago.  She was supposed to go over her records from [place], [place] and call her.  She has not received a call/

## 2013-03-05 ENCOUNTER — Telehealth: Payer: Self-pay | Admitting: Internal Medicine

## 2013-03-05 NOTE — Telephone Encounter (Signed)
Spoke with pt, she has been up baking all day yesterday. She sat down to rest and watch tv, when she stood up she was unable to stand because her legs were too weak. She sat back down, she crawled over to her walker but was unable to get up and the neighbor had to come help. She denies dizziness at any time. Her bp was 168/60. Today she is still wobbly but not as bad, she is having to use her walker to get around the house and she has not had to use it in some time. She reports she is very congested and has a bad cold. She is taking Claritin. There has been no dizziness at all. On 03-01-13 we changed her to pindolol but this is the first problem she has had. Explained it sounds more like vertigo to me but will make dr Tenny Craw aware. Pt agreed with this plan.

## 2013-03-05 NOTE — Telephone Encounter (Signed)
New message     Last night pt lost balance---she could not stand up--she was very "wobbley".  Dr Tenny Craw changed her medication on 12-8, could this be the problem?

## 2013-03-05 NOTE — Telephone Encounter (Signed)
Discussed with dr Tenny Craw, she agreed it sounded more like vertigo. The pt was instructed to get in touch with her primary care md. Patient voiced understanding

## 2013-03-08 ENCOUNTER — Encounter: Payer: Self-pay | Admitting: Internal Medicine

## 2013-03-19 ENCOUNTER — Encounter: Payer: Self-pay | Admitting: Internal Medicine

## 2013-03-29 ENCOUNTER — Ambulatory Visit: Payer: Medicare Other | Admitting: Internal Medicine

## 2013-03-29 ENCOUNTER — Ambulatory Visit (INDEPENDENT_AMBULATORY_CARE_PROVIDER_SITE_OTHER): Payer: Medicare Other | Admitting: Internal Medicine

## 2013-03-29 ENCOUNTER — Encounter: Payer: Self-pay | Admitting: Internal Medicine

## 2013-03-29 VITALS — BP 132/74 | HR 69 | Ht 68.0 in | Wt 135.0 lb

## 2013-03-29 DIAGNOSIS — I25810 Atherosclerosis of coronary artery bypass graft(s) without angina pectoris: Secondary | ICD-10-CM

## 2013-03-29 DIAGNOSIS — I4891 Unspecified atrial fibrillation: Secondary | ICD-10-CM

## 2013-03-29 MED ORDER — ASPIRIN EC 81 MG PO TBEC
81.0000 mg | DELAYED_RELEASE_TABLET | Freq: Every day | ORAL | Status: DC
Start: 2013-03-29 — End: 2018-02-14
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
## 2013-03-29 NOTE — Patient Instructions (Signed)
Your physician wants you to follow-up in: [place] will receive a reminder letter in the mail two months in advance. If you don't receive a letter, please call our office to schedule the follow-up appointment.   START ENTERIC COATED ASPIRIN 81 MG ONCE DAILY

## 2013-04-13 ENCOUNTER — Ambulatory Visit (INDEPENDENT_AMBULATORY_CARE_PROVIDER_SITE_OTHER): Payer: Medicare Other | Admitting: Internal Medicine

## 2013-04-13 ENCOUNTER — Encounter: Payer: Self-pay | Admitting: Internal Medicine

## 2013-04-13 VITALS — BP 148/88 | HR 81 | Ht 68.0 in | Wt 136.0 lb

## 2013-04-13 DIAGNOSIS — T82111A Breakdown (mechanical) of cardiac pulse generator (battery), initial encounter: Secondary | ICD-10-CM

## 2013-04-13 DIAGNOSIS — I25810 Atherosclerosis of coronary artery bypass graft(s) without angina pectoris: Secondary | ICD-10-CM

## 2013-04-13 DIAGNOSIS — R55 Syncope and collapse: Secondary | ICD-10-CM

## 2013-04-13 DIAGNOSIS — I4891 Unspecified atrial fibrillation: Secondary | ICD-10-CM

## 2013-04-13 NOTE — Assessment & Plan Note (Signed)
She denies anginal symptoms. She will followup with Dr. Harrington Challenger.

## 2013-04-13 NOTE — Patient Instructions (Signed)
Your physician wants you to follow-up in: 12 months With Dr Knox Saliva will receive a reminder letter in the mail two months in advance. If you don't receive a letter, please call our office to schedule the follow-up appointment.   Remote monitoring is used to monitor your Pacemaker or ICD from home. This monitoring reduces the number of office visits required to check your device to one time per year. It allows [place] to keep an eye on the functioning of your device to ensure it is working properly. You are scheduled for a device check from home on 07/15/13. You may send your transmission at any time that day. If you have a wireless device, the transmission will be sent automatically. After your physician reviews your transmission, you will receive a postcard with your next transmission date.

## 2013-04-13 NOTE — Assessment & Plan Note (Signed)
She has had no recurrent episodes since her right ventricular lead revision. Hopefully her syncope will remain quiet.

## 2013-04-13 NOTE — Progress Notes (Signed)
HPI Meredith Gonzalez returns today for followup. She is a pleasant 78 yo woman with a h/o symptomatic bradycardia and syncope, s/p PPM insertion. She developed RV lead dysfunction and was found to have malfunction of her RV lead.  Interrogation of the lead demonstrated elevated pacing impedances both bipolar, and unipolar. She did not have an obvious loose set screw at the time of her RV lead revision. Since her lead revision, she has had no recurrent syncope. She had several episodes of syncope prior to the lead revision. She denies chest pain. She does have some tenderness over her pacemaker insertion site. She denies fevers or chills. The tenderness is more positional. When the pacemaker moves laterally, it presses on her shoulder joint. Allergies  Allergen Reactions  . Valium [Diazepam]     Makes patient hyper  . Codeine Hives and Itching    Can take with Benadryl  . Darvon [Propoxyphene Hcl] Itching    Can take with Benadryl  . Daypro [Oxaprozin] Itching    Can take with Benadryl  . Enablex [Darifenacin Hydrobromide Er] Itching    Can take with Benadryl  . Oxycodone Itching    Can take with Benadryl  . Risperdal [Risperidone] Itching    Can take with Benadryl  . Talwin [Pentazocine] Itching    Can take with Benadryl  . Vicodin [Hydrocodone-Acetaminophen] Itching    Can take with Benadryl     Current Outpatient Prescriptions  Medication Sig Dispense Refill  . acetaminophen (TYLENOL) 500 MG tablet Take 500 mg by mouth every 6 (six) hours as needed for pain.      Marland Kitchen aspirin EC 81 MG tablet Take 1 tablet (81 mg total) by mouth daily.  90 tablet  3  . B Complex Vitamins (B-COMPLEX/B-12 PO) Take by mouth.      . diphenhydrAMINE (BENADRYL) 25 mg capsule Take 25 mg by mouth every 6 (six) hours as needed for itching.      Marland Kitchen FLUoxetine (PROZAC) 20 MG capsule Take 60 mg by mouth daily.      . folic acid (FOLVITE) 1 MG tablet Take 1 mg by mouth daily.      . furosemide (LASIX) 20 MG tablet  Take 20 mg by mouth daily.      Marland Kitchen gabapentin (NEURONTIN) 300 MG capsule Take 300 mg by mouth 3 (three) times daily.       Marland Kitchen levothyroxine (SYNTHROID, LEVOTHROID) 100 MCG tablet Take 100 mcg by mouth daily.      . pindolol (VISKEN) 5 MG tablet Take 1 tablet (5 mg total) by mouth 2 (two) times daily.  60 tablet  12  . PROAIR HFA 108 (90 BASE) MCG/ACT inhaler        No current facility-administered medications for this visit.     Past Medical History  Diagnosis Date  . Hypothyroidism   . Depression   . Diabetes mellitus without complication   . Pacemaker   . Stroke 2000  . Headache(784.0)   . Arthritis   . Anemia   . Incontinence of urine   . Cancer     basal cell skin cancers  . Fibromyalgia     ROS:   All systems reviewed and negative except as noted in the HPI.   Past Surgical History  Procedure Laterality Date  . Pacemaker insertion    . Basal cell cancer removal    . Abdominal hysterectomy    . Cholecystectomy    . Femur fracture surgery  bilateral  . Right ankle surgery    . Carpal tunnel release      bilateral  . Gastric bypass    . Eye surgery      right eye catarace/lens implant  . Colonoscopy    . Cholecystectomy       Family History  Problem Relation Age of Onset  . Congestive Heart Failure Mother   . Heart attack Father   . Alcoholism Father   . Alcoholism Sister   . Alcoholism Brother   . Alcoholism Brother   . Throat cancer Brother      History   Social History  . Marital Status: Widowed    Spouse Name: N/A    Number of Children: N/A  . Years of Education: N/A   Occupational History  . Not on file.   Social History Main Topics  . Smoking status: Former Smoker -- 0.50 packs/day for 4 years    Types: Cigarettes    Quit date: 08/07/1972  . Smokeless tobacco: Never Used  . Alcohol Use: Yes     Comment: wine cooler once or twice per year  . Drug Use: No  . Sexual Activity: Not on file   Other Topics Concern  . Not on file     Social History Narrative  . No narrative on file     BP 148/88  Pulse 81  Ht 5\' 8"  (1.727 m)  Wt 136 lb (61.689 kg)  BMI 20.68 kg/m2  Physical Exam:  Well appearing 78 year old woman, NAD HEENT: Unremarkable Neck: 6 cm JVD, no thyromegally Back:  No CVA tenderness Lungs:  Clear with no wheezes, rales, or rhonchi. HEART:  Regular rate rhythm, no murmurs, no rubs, no clicks Abd:  soft, positive bowel sounds, no organomegally, no rebound, no guarding Ext:  2 plus pulses, no edema, no cyanosis, no clubbing Skin:  No rashes no nodules Neuro:  CN II through XII intact, motor grossly intact   DEVICE  Normal device function.  See PaceArt for details.   Assess/Plan:

## 2013-04-13 NOTE — Assessment & Plan Note (Signed)
Her [employer] dual-chamber pacemaker appears to be working normally. We'll follow. We'll plan to recheck in several months.

## 2013-04-15 LAB — MDC_IDC_ENUM_SESS_TYPE_INCLINIC
Implantable Pulse Generator Model: 359524
Implantable Pulse Generator Serial Number: 66080052
Lead Channel Impedance Value: 702 Ohm
Lead Channel Pacing Threshold Amplitude: 0.6 V
Lead Channel Pacing Threshold Amplitude: 0.6 V
Lead Channel Pacing Threshold Pulse Width: 0.4 ms
Lead Channel Pacing Threshold Pulse Width: 0.4 ms
Lead Channel Sensing Intrinsic Amplitude: 16.3 mV
Lead Channel Setting Pacing Amplitude: 2.4 V
Lead Channel Setting Pacing Pulse Width: 0.4 ms
MDC IDC MSMT LEADCHNL RA IMPEDANCE VALUE: 663 Ohm
MDC IDC MSMT LEADCHNL RA PACING THRESHOLD AMPLITUDE: 0.7 V
MDC IDC MSMT LEADCHNL RA PACING THRESHOLD PULSEWIDTH: 0.4 ms
MDC IDC MSMT LEADCHNL RA SENSING INTR AMPL: 2.2 mV
MDC IDC SESS DTM: 20150120195800
MDC IDC SET LEADCHNL RV PACING AMPLITUDE: 1.6 V
MDC IDC STAT BRADY RA PERCENT PACED: 46 %
MDC IDC STAT BRADY RV PERCENT PACED: 1 %

## 2013-07-15 ENCOUNTER — Encounter: Payer: Medicare Other | Admitting: *Deleted

## 2013-07-15 ENCOUNTER — Telehealth: Payer: Self-pay | Admitting: Internal Medicine

## 2013-07-15 NOTE — Telephone Encounter (Signed)
Instructions given to patient for remote transmissions and she voiced understanding.

## 2013-07-15 NOTE — Telephone Encounter (Signed)
New message     Have a remote pacemaker check scheduled today.  She needs someone to call and help her do it.

## 2013-07-19 ENCOUNTER — Encounter: Payer: Self-pay | Admitting: *Deleted

## 2013-07-30 ENCOUNTER — Ambulatory Visit (INDEPENDENT_AMBULATORY_CARE_PROVIDER_SITE_OTHER): Payer: Medicare Other | Admitting: Internal Medicine

## 2013-07-30 ENCOUNTER — Encounter: Payer: Self-pay | Admitting: Internal Medicine

## 2013-07-30 VITALS — BP 140/70 | HR 70 | Ht 68.0 in | Wt 135.0 lb

## 2013-07-30 DIAGNOSIS — I10 Essential (primary) hypertension: Secondary | ICD-10-CM

## 2013-07-30 DIAGNOSIS — I4891 Unspecified atrial fibrillation: Secondary | ICD-10-CM

## 2013-07-30 DIAGNOSIS — R609 Edema, unspecified: Secondary | ICD-10-CM

## 2013-07-30 DIAGNOSIS — I2510 Atherosclerotic heart disease of native coronary artery without angina pectoris: Secondary | ICD-10-CM

## 2013-07-30 DIAGNOSIS — R0602 Shortness of breath: Secondary | ICD-10-CM

## 2013-07-30 LAB — BASIC METABOLIC PANEL
BUN: 17 mg/dL (ref 6–23)
CO2: 30 mEq/L (ref 19–32)
Calcium: 8.9 mg/dL (ref 8.4–10.5)
Chloride: 102 mEq/L (ref 96–112)
Creatinine, Ser: 0.8 mg/dL (ref 0.4–1.2)
GFR: 70.07 mL/min (ref >60.00)
Glucose, Bld: 93 mg/dL (ref 70–99)
POTASSIUM: 3.9 meq/L (ref 3.5–5.1)
SODIUM: 140 meq/L (ref 135–145)

## 2013-07-30 LAB — CBC
HCT: 33 % — ABNORMAL LOW (ref 36.0–46.0)
Hemoglobin: 10.9 g/dL — ABNORMAL LOW (ref 12.0–15.0)
MCHC: 33 g/dL (ref 30.0–36.0)
MCV: 96.4 fl (ref 78.0–100.0)
Platelets: 291 10*3/uL (ref 150.0–400.0)
RBC: 3.43 Mil/uL — ABNORMAL LOW (ref 3.87–5.11)
RDW: 13.6 % (ref 11.5–15.5)
WBC: 6 10*3/uL (ref 4.0–10.5)

## 2013-07-30 LAB — BRAIN NATRIURETIC PEPTIDE: PRO B NATRI PEPTIDE: 214 pg/mL — AB (ref 0.0–100.0)

## 2013-07-30 LAB — TSH: TSH: 3.69 u[IU]/mL (ref 0.35–4.50)

## 2013-07-30 NOTE — Progress Notes (Signed)
HPI Patient is a 78 yo with a history of  HTN, hypothyroidism, atrial fib, fall with subdural hematoma, tib fx and wrist fracture and syncope  S/p PPM a few years ago.   Cath in 1995 No CAD  LVEF 55%  Nuclear stress test 2013  No ischemia. Tilt table 2014 for syncope.  Reported neg but review showed signif drop in BP  When I saw her in Sept I stopped Xarelto with history of syncope After I saw her earlier this fall she was seen by Beckie Salts  Found to have a fractured lead in pacemker  Had lead revision   When records finally obtained from [place] for tilt table Diltiazem and amlodipine was d/c'd and she was placed on pindolol  Since seen she has done well  No dizziness  No CP  No SOB    Allergies  Allergen Reactions  . Valium [Diazepam]     Makes patient hyper  . Codeine Hives and Itching    Can take with Benadryl  . Darvon [Propoxyphene Hcl] Itching    Can take with Benadryl  . Daypro [Oxaprozin] Itching    Can take with Benadryl  . Enablex [Darifenacin Hydrobromide Er] Itching    Can take with Benadryl  . Oxycodone Itching    Can take with Benadryl  . Risperdal [Risperidone] Itching    Can take with Benadryl  . Talwin [Pentazocine] Itching    Can take with Benadryl  . Vicodin [Hydrocodone-Acetaminophen] Itching    Can take with Benadryl    Current Outpatient Prescriptions  Medication Sig Dispense Refill  . acetaminophen (TYLENOL) 500 MG tablet Take 500 mg by mouth every 6 (six) hours as needed for pain.      Marland Kitchen aspirin EC 81 MG tablet Take 1 tablet (81 mg total) by mouth daily.  90 tablet  3  . B Complex Vitamins (B-COMPLEX/B-12 PO) Take by mouth.      . beclomethasone (QVAR) 80 MCG/ACT inhaler Inhale into the lungs 2 (two) times daily.      . benzonatate (TESSALON) 100 MG capsule Take by mouth 3 (three) times daily as needed for cough.      . calcium citrate-vitamin D (CITRACAL+D) 315-200 MG-UNIT per tablet Take 1 tablet by mouth 2 (two) times daily.      . cetirizine  (ZYRTEC) 10 MG tablet Take 10 mg by mouth daily.      . cholecalciferol (VITAMIN D) 400 UNITS TABS tablet Take 400 Units by mouth. 2000 units      . dextromethorphan (DELSYM) 30 MG/5ML liquid Take 30 mg by mouth as needed for cough.      . diphenhydrAMINE (BENADRYL) 25 mg capsule Take 25 mg by mouth every 6 (six) hours as needed for itching.      Marland Kitchen FLUoxetine (PROZAC) 20 MG capsule Take 60 mg by mouth daily.      . furosemide (LASIX) 20 MG tablet Take 20 mg by mouth daily.      Marland Kitchen gabapentin (NEURONTIN) 300 MG capsule Take 300 mg by mouth 3 (three) times daily.       . Homeopathic Products (LEG CRAMP RELIEF PO) Take by mouth.      Marland Kitchen ipratropium (ATROVENT) 0.06 % nasal spray Place 2 sprays into both nostrils 4 (four) times daily.      Marland Kitchen levothyroxine (SYNTHROID, LEVOTHROID) 100 MCG tablet Take 100 mcg by mouth daily.      . Multiple Vitamin (MULTIVITAMIN) capsule Take 1 capsule by mouth daily.      Marland Kitchen  pindolol (VISKEN) 5 MG tablet Take 1 tablet (5 mg total) by mouth 2 (two) times daily.  60 tablet  12  . PROAIR HFA 108 (90 BASE) MCG/ACT inhaler       . ranitidine (ZANTAC) 150 MG tablet Take 150 mg by mouth 2 (two) times daily.      . simethicone (MYLICON) 80 MG chewable tablet Chew 80 mg by mouth every 6 (six) hours as needed for flatulence.       No current facility-administered medications for this visit.    Past Medical History  Diagnosis Date  . Hypothyroidism   . Depression   . Diabetes mellitus without complication   . Pacemaker   . Stroke 2000  . Headache(784.0)   . Arthritis   . Anemia   . Incontinence of urine   . Cancer     basal cell skin cancers  . Fibromyalgia     Past Surgical History  Procedure Laterality Date  . Pacemaker insertion    . Basal cell cancer removal    . Abdominal hysterectomy    . Cholecystectomy    . Femur fracture surgery      bilateral  . Right ankle surgery    . Carpal tunnel release      bilateral  . Gastric bypass    . Eye surgery       right eye catarace/lens implant  . Colonoscopy    . Cholecystectomy      Family History  Problem Relation Age of Onset  . Congestive Heart Failure Mother   . Heart attack Father   . Alcoholism Father   . Alcoholism Sister   . Alcoholism Brother   . Alcoholism Brother   . Throat cancer Brother     History   Social History  . Marital Status: Widowed    Spouse Name: N/A    Number of Children: N/A  . Years of Education: N/A   Occupational History  . Not on file.   Social History Main Topics  . Smoking status: Former Smoker -- 0.50 packs/day for 4 years    Types: Cigarettes    Quit date: 08/07/1972  . Smokeless tobacco: Never Used  . Alcohol Use: Yes     Comment: wine cooler once or twice per year  . Drug Use: No  . Sexual Activity: Not on file   Other Topics Concern  . Not on file   Social History Narrative  . No narrative on file    Review of Systems:  All systems reviewed.  They are negative to the above problem except as previously stated.  Vital Signs: BP 140/70  Pulse 70  Ht 5\' 8"  (1.727 m)  Wt 135 lb (61.236 kg)  BMI 20.53 kg/m2  Physical Exam Patient is in NAD HEENT:  Normocephalic, atraumatic. EOMI, PERRLA.  Neck: JVP is normal.  No bruits.  Lungs: clear to auscultation. No rales no wheezes.  Heart: Regular rate and rhythm. Normal S1, S2. No S3.   No significant murmurs. PMI not displaced.  Abdomen:  Supple, nontender. Normal bowel sounds. No masses. No hepatomegaly.  Extremities:   Good distal pulses throughout. Tr lower extremity edema.  Musculoskeletal :moving all extremities.  Neuro:   alert and oriented x3.  CN II-XII grossly intact.  Sinus rhythm  62  First degree AV block.  206 msec.  rSR'  V1  LAFB   Assessment and Plan:  1.  Syncope  Doing well since lead revision.  No further spells.  2. .  Afib  Reported in records.  Reported in records  I will need to follow up pacer data  Currently in SR    4.  PPM  S/p lead revision  Can  follow rhythm

## 2013-07-30 NOTE — Patient Instructions (Signed)
Your physician recommends that you return for lab work TODAY (CBC, [place])  Your physician wants you to follow-up in: December, 2015.   You will receive a reminder letter in the mail two months in advance. If you don't receive a letter, please call our office to schedule the follow-up appointment.

## 2013-08-03 ENCOUNTER — Other Ambulatory Visit: Payer: Self-pay | Admitting: *Deleted

## 2013-08-03 DIAGNOSIS — I25810 Atherosclerosis of coronary artery bypass graft(s) without angina pectoris: Secondary | ICD-10-CM

## 2013-08-03 MED ORDER — FUROSEMIDE 20 MG PO TABS: ORAL_TABLET | ORAL | Status: DC

## 2013-09-10 ENCOUNTER — Ambulatory Visit (INDEPENDENT_AMBULATORY_CARE_PROVIDER_SITE_OTHER): Payer: Medicare Other | Admitting: *Deleted

## 2013-09-10 DIAGNOSIS — I480 Paroxysmal atrial fibrillation: Secondary | ICD-10-CM

## 2013-09-10 DIAGNOSIS — I4891 Unspecified atrial fibrillation: Secondary | ICD-10-CM

## 2013-09-10 NOTE — Progress Notes (Signed)
Remote pacemaker transmission.   

## 2013-12-30 ENCOUNTER — Encounter: Payer: Self-pay | Admitting: Internal Medicine

## 2014-03-03 ENCOUNTER — Ambulatory Visit (INDEPENDENT_AMBULATORY_CARE_PROVIDER_SITE_OTHER): Payer: Medicare Other | Admitting: *Deleted

## 2014-03-03 ENCOUNTER — Ambulatory Visit (INDEPENDENT_AMBULATORY_CARE_PROVIDER_SITE_OTHER): Payer: Medicare Other | Admitting: Internal Medicine

## 2014-03-03 ENCOUNTER — Encounter: Payer: Self-pay | Admitting: Internal Medicine

## 2014-03-03 VITALS — BP 148/70 | HR 70 | Ht 68.0 in | Wt 137.6 lb

## 2014-03-03 DIAGNOSIS — I25810 Atherosclerosis of coronary artery bypass graft(s) without angina pectoris: Secondary | ICD-10-CM

## 2014-03-03 DIAGNOSIS — I10 Essential (primary) hypertension: Secondary | ICD-10-CM

## 2014-03-03 DIAGNOSIS — I480 Paroxysmal atrial fibrillation: Secondary | ICD-10-CM

## 2014-03-03 DIAGNOSIS — I4891 Unspecified atrial fibrillation: Secondary | ICD-10-CM

## 2014-03-03 DIAGNOSIS — I2510 Atherosclerotic heart disease of native coronary artery without angina pectoris: Secondary | ICD-10-CM

## 2014-03-03 LAB — BASIC METABOLIC PANEL
BUN: 13 mg/dL (ref 6–23)
CALCIUM: 8.5 mg/dL (ref 8.4–10.5)
CO2: 29 mEq/L (ref 19–32)
CREATININE: 0.6 mg/dL (ref 0.4–1.2)
Chloride: 105 mEq/L (ref 96–112)
GFR: 97.51 mL/min (ref >60.00)
Glucose, Bld: 96 mg/dL (ref 70–99)
Potassium: 4 mEq/L (ref 3.5–5.1)
Sodium: 139 mEq/L (ref 135–145)

## 2014-03-03 LAB — MDC_IDC_ENUM_SESS_TYPE_INCLINIC
Brady Statistic RV Percent Paced: 0 %
Date Time Interrogation Session: 20151210092004
Implantable Pulse Generator Serial Number: 66080052
Lead Channel Pacing Threshold Amplitude: 0.6 V
Lead Channel Pacing Threshold Amplitude: 0.7 V
Lead Channel Pacing Threshold Pulse Width: 0.4 ms
Lead Channel Setting Pacing Amplitude: 1.6 V
MDC IDC MSMT LEADCHNL RA IMPEDANCE VALUE: 643 Ohm
MDC IDC MSMT LEADCHNL RA SENSING INTR AMPL: 1.5 mV
MDC IDC MSMT LEADCHNL RV IMPEDANCE VALUE: 663 Ohm
MDC IDC MSMT LEADCHNL RV PACING THRESHOLD PULSEWIDTH: 0.4 ms
MDC IDC MSMT LEADCHNL RV SENSING INTR AMPL: 14.8 mV
MDC IDC SET LEADCHNL RA PACING AMPLITUDE: 1.6 V
MDC IDC SET LEADCHNL RV PACING PULSEWIDTH: 0.4 ms
MDC IDC STAT BRADY RA PERCENT PACED: 47 %

## 2014-03-03 LAB — CBC
HEMATOCRIT: 31.8 % — AB (ref 36.0–46.0)
HEMOGLOBIN: 10.2 g/dL — AB (ref 12.0–15.0)
MCHC: 32 g/dL (ref 30.0–36.0)
MCV: 94 fl (ref 78.0–100.0)
PLATELETS: 222 10*3/uL (ref 150.0–400.0)
RBC: 3.38 Mil/uL — ABNORMAL LOW (ref 3.87–5.11)
RDW: 15.2 % (ref 11.5–15.5)
WBC: 4.6 10*3/uL (ref 4.0–10.5)

## 2014-03-03 LAB — BRAIN NATRIURETIC PEPTIDE: Pro B Natriuretic peptide (BNP): 418 pg/mL — ABNORMAL HIGH (ref 0.0–100.0)

## 2014-03-03 NOTE — Progress Notes (Signed)
HPI Patient is a 78 yo with a history of  HTN, hypothyroidism, atrial fib, fall with subdural hematoma, tib fx and wrist fracture and syncope  S/p PPM a few years ago.   Cath in 1995 No CAD  LVEF 55%  Nuclear stress test 2013  No ischemia. Tilt table 2014 for syncope.  Reported neg but review showed signif drop in BP t I stopped Xarelto with history of syncope After I saw her earlier this fall she was seen by Beckie Salts  Found to have a fractured lead in pacemker  Had lead revision   When records finally obtained from [place] for tilt table Diltiazem and amlodipine was d/c'd and she was placed on pindolol  I saw her back in May 2015  Patient had pacer lead revised and  IN June in bed at night Next thing was on floor in kitchen and nie[place] by her  Reportedly call her  Son thinks it is because she took [place]   No  Dizzy spells  Breathing OK  No CP  Allergies  Allergen Reactions  . Valium [Diazepam]     Makes patient hyper  . Codeine Hives and Itching    Can take with Benadryl  . Darvon [Propoxyphene Hcl] Itching    Can take with Benadryl  . Daypro [Oxaprozin] Itching    Can take with Benadryl  . Enablex [Darifenacin Hydrobromide Er] Itching    Can take with Benadryl  . Oxycodone Itching    Can take with Benadryl  . Risperdal [Risperidone] Itching    Can take with Benadryl  . Talwin [Pentazocine] Itching    Can take with Benadryl  . Vicodin [Hydrocodone-Acetaminophen] Itching    Can take with Benadryl    Current Outpatient Prescriptions  Medication Sig Dispense Refill  . acetaminophen (TYLENOL) 500 MG tablet Take 500 mg by mouth every 6 (six) hours as needed for pain.    Marland Kitchen aspirin EC 81 MG tablet Take 1 tablet (81 mg total) by mouth daily. 90 tablet 3  . B Complex Vitamins (B-COMPLEX/B-12 PO) Take by mouth.    . beclomethasone (QVAR) 40 MCG/ACT inhaler Inhale into the lungs.    . calcium citrate-vitamin D (CITRACAL+D) 315-200 MG-UNIT per tablet Take 1 tablet by mouth 2 (two)  times daily.    . cholecalciferol (VITAMIN D) 400 UNITS TABS tablet Take 400 Units by mouth. 2000 units    . FLUoxetine (PROZAC) 20 MG capsule Take 60 mg by mouth daily.    . furosemide (LASIX) 20 MG tablet One tablet daily except two tablets every Wednesday and saturday 38 tablet 12  . gabapentin (NEURONTIN) 300 MG capsule Take 300 mg by mouth 3 (three) times daily.     . Homeopathic Products (LEG CRAMP RELIEF PO) Take by mouth.    Marland Kitchen ipratropium (ATROVENT) 0.06 % nasal spray Place 2 sprays into both nostrils 4 (four) times daily.    Marland Kitchen levothyroxine (SYNTHROID, LEVOTHROID) 100 MCG tablet Take 100 mcg by mouth daily.    . mirtazapine (REMERON SOL-TAB) 15 MG disintegrating tablet Take 15 mg by mouth daily.    . montelukast (SINGULAIR) 10 MG tablet Take 10 mg by mouth daily.  0  . Multiple Vitamin (MULTIVITAMIN) capsule Take 1 capsule by mouth daily.    . pindolol (VISKEN) 5 MG tablet Take 1 tablet (5 mg total) by mouth 2 (two) times daily. 60 tablet 12  . PROAIR HFA 108 (90 BASE) MCG/ACT inhaler     . ranitidine (ZANTAC) 150  MG tablet Take 150 mg by mouth 2 (two) times daily.    . simethicone (MYLICON) 80 MG chewable tablet Chew 80 mg by mouth every 6 (six) hours as needed for flatulence.     No current facility-administered medications for this visit.    Past Medical History  Diagnosis Date  . Hypothyroidism   . Depression   . Diabetes mellitus without complication   . Pacemaker   . Stroke 2000  . Headache(784.0)   . Arthritis   . Anemia   . Incontinence of urine   . Cancer     basal cell skin cancers  . Fibromyalgia     Past Surgical History  Procedure Laterality Date  . Pacemaker insertion    . Basal cell cancer removal    . Abdominal hysterectomy    . Cholecystectomy    . Femur fracture surgery      bilateral  . Right ankle surgery    . Carpal tunnel release      bilateral  . Gastric bypass    . Eye surgery      right eye catarace/lens implant  . Colonoscopy    .  Cholecystectomy      Family History  Problem Relation Age of Onset  . Congestive Heart Failure Mother   . Heart attack Father   . Alcoholism Father   . Alcoholism Sister   . Alcoholism Brother   . Alcoholism Brother   . Throat cancer Brother     History   Social History  . Marital Status: Widowed    Spouse Name: N/A    Number of Children: N/A  . Years of Education: N/A   Occupational History  . Not on file.   Social History Main Topics  . Smoking status: Former Smoker -- 0.50 packs/day for 4 years    Types: Cigarettes    Quit date: 08/07/1972  . Smokeless tobacco: Never Used  . Alcohol Use: Yes     Comment: wine cooler once or twice per year  . Drug Use: No  . Sexual Activity: Not on file   Other Topics Concern  . Not on file   Social History Narrative    Review of Systems:  All systems reviewed.  They are negative to the above problem except as previously stated.  Vital Signs: BP 148/70 mmHg  Pulse 70  Ht 5\' 8"  (1.727 m)  Wt 137 lb 9.6 oz (62.415 kg)  BMI 20.93 kg/m2  Physical Exam Patient is in NAD HEENT:  Normocephalic, atraumatic. EOMI, PERRLA.  Neck: JVP is normal.  No bruits.  Lungs: clear to auscultation. No rales no wheezes.  Heart: Regular rate and rhythm. Normal S1, S2. No S3.   No significant murmurs. PMI not displaced.  Abdomen:  Supple, nontender. Normal bowel sounds. No masses. No hepatomegaly.  Extremities:   Good distal pulses throughout. Tr lower extremity edema.  Musculoskeletal :moving all extremities.  Neuro:   alert and oriented x3.  CN II-XII grossly intact.  EKG  AV sequential pacing  70 bpm  Assessment and Plan:  1.  Spell  COncerning  No  Real prodrome  Interrogation of pacer shows no afib  ? ambien and sleep walking   Follow  I would not anticoag for now.    2. .  Afib Currently in SR  INterrogation of pacer shows no spells   Has appt with [facility] soon    4.  PPM  S/p lead revision

## 2014-03-03 NOTE — Progress Notes (Signed)
Pacemaker check in clinic. Normal device function. Thresholds, sensing, impedances consistent with previous measurements. Device programmed to maximize longevity. 3 mode switches--all less than 1 minute. 1 high ventricular rate noted on 08-27-13 with V rate 176 bpm. Device programmed at appropriate safety margins. Histogram distribution appropriate for patient activity level. Device programmed to optimize intrinsic conduction. Estimated longevity 7 years 11 months. Patient enrolled in remote follow-up. ROV in January with GT.

## 2014-03-03 NOTE — Patient Instructions (Addendum)
Your physician recommends that you return for lab work today (cbc, bmet, bnp)  Your physician wants you to follow-up in: 6 months with Dr. Harrington Challenger.  You will receive a reminder letter in the mail two months in advance. If you don't receive a letter, please call our office to schedule the follow-up appointment.  Your physician recommends that you schedule a follow-up appointment in: January 2016 with Dr. Lovena Le.

## 2014-03-14 ENCOUNTER — Telehealth: Payer: Self-pay | Admitting: Internal Medicine

## 2014-03-14 NOTE — Telephone Encounter (Signed)
New message      Will Dr Harrington Challenger complete papers from the DMV---she saw her 03-03-14.  Please call

## 2014-03-14 NOTE — Telephone Encounter (Signed)
Patient requests Dr. Harrington Challenger fill out papers for her to renew her driver's license, since passing out and needing lead revision of pacemaker last year.  Advised Dr. Harrington Challenger will be out of this office for several weeks. She needs to have the paper back to [facility] by Jan. 12. She will drop off beginning of January. If Dr. Harrington Challenger is unable to do the paperwork, I will let her know.  Pt also wants to know if Dr. Harrington Challenger will recommend a PCP in the [facility].

## 2014-03-28 ENCOUNTER — Telehealth: Payer: Self-pay | Admitting: Internal Medicine

## 2014-03-28 NOTE — Telephone Encounter (Signed)
Walk in pt form " Dept of Transportation papers" dropped off gave to [employer]

## 2014-04-01 NOTE — Telephone Encounter (Signed)
Paperwork received plaaced in folder for Dr. Harrington Challenger to review. She is in office on 04/04/14.

## 2014-04-04 ENCOUNTER — Telehealth: Payer: Self-pay | Admitting: Internal Medicine

## 2014-04-04 NOTE — Telephone Encounter (Signed)
Left message to call back. Patient returned call.  Informed patient that Dr. Harrington Challenger does have the paper work in her folder. Told patient that Dr. Harrington Challenger is in clinic today and that hopefully she will have it ready for her today. Advised patient that if she does not hear back from our office before tomorrow, to give [place] a call back. Patient stressed that she needs this paper work turned into [place] next week. Assured patient that Dr. Harrington Challenger should have it filled out soon. Patient verbalized understanding.

## 2014-04-04 NOTE — Telephone Encounter (Signed)
New message     1. Are you calling in reference to your FMLA or disability form? DMV form  2. What is your question in regards to FMLA or disability form? No    3. Do you need copies of your medical records? No   4. Are you waiting on a nurse to call you back with results or are you wanting copies of your results? Yes . When can she come pick up form- has a deadline of next week. Form was brought to the office has about  8-9 pages

## 2014-04-07 ENCOUNTER — Telehealth: Payer: Self-pay | Admitting: Internal Medicine

## 2014-04-07 NOTE — Telephone Encounter (Signed)
New [facility]     1. Are you calling in reference to your FMLA or disability form? No  2. What is your question in regards to FMLA or disability form? no   3. Do you need copies of your medical records? No   4. Are you waiting on a nurse to call you back with results or are you wanting copies of your results? no   Pt calling about a DMV forms that she requested for  Dr. Harrington Challenger to complete.   Pt states it has to be sent to [facility] by next week. Please return call.

## 2014-04-07 NOTE — Telephone Encounter (Signed)
Pt said that the [facility] papers need to be send to [facility] this week. Pt spoke with Theda Sers on 04/04/13 and said that Dr. Harrington Challenger had the papers and she was going to filled and  Sign out. I looked for the paper they  supposed to be in a manila envelope, I was not able to locate them. I called  [place] at home. She does not know where papers are she thinks that  Dr. Harrington Challenger' may have them. I spoke with Dr. Harrington Challenger on the phone, she will have  the forms in the office  tomorrow. Pt is aware. She will come in  tomorrow afternoon  to get the papers. She will ask for them at the front desk.

## 2014-04-19 ENCOUNTER — Encounter: Payer: Medicare Other | Admitting: Internal Medicine

## 2014-05-02 ENCOUNTER — Telehealth: Payer: Self-pay | Admitting: Internal Medicine

## 2014-05-02 NOTE — Telephone Encounter (Signed)
New Message     Patient has paper work that Dr. Harrington Challenger needs to fill out for the [facility].  Please give patient a call back

## 2014-05-02 NOTE — Telephone Encounter (Signed)
The papers that were filled out by Dr. Harrington Challenger were returned to her. She wants Dr. Harrington Challenger to complete another section. She will bring papers by this week. I will review with Dr. Harrington Challenger on Friday in clinic.

## 2014-05-06 NOTE — Telephone Encounter (Signed)
Received papers, reviewed with Dr. Harrington Challenger. Placed in Dr. Alan Ripper folder to complete.

## 2014-05-12 NOTE — Telephone Encounter (Signed)
Paperwork is complete for DMV by Dr. Harrington Challenger. Left message for patient to call back to tell us if she wants them mailed back to her or if she wants to pick them up at the office.

## 2014-05-13 ENCOUNTER — Telehealth: Payer: Self-pay | Admitting: Internal Medicine

## 2014-05-13 NOTE — Telephone Encounter (Signed)
New message      FYI Pt will come by today to pick up papers.

## 2014-05-13 NOTE — Telephone Encounter (Signed)
Handed completed DMV papers to patient in lobby. She is very appreciative for them being completed.

## 2014-05-13 NOTE — Telephone Encounter (Signed)
Completed DMV paperwork handed to patient in lobby. She is very appreciative for them being completed.

## 2014-05-17 ENCOUNTER — Ambulatory Visit (INDEPENDENT_AMBULATORY_CARE_PROVIDER_SITE_OTHER): Payer: Medicare Other | Admitting: Internal Medicine

## 2014-05-17 ENCOUNTER — Encounter: Payer: Self-pay | Admitting: Internal Medicine

## 2014-05-17 VITALS — BP 164/72 | HR 64 | Ht 66.0 in | Wt 135.2 lb

## 2014-05-17 DIAGNOSIS — I10 Essential (primary) hypertension: Secondary | ICD-10-CM

## 2014-05-17 DIAGNOSIS — I480 Paroxysmal atrial fibrillation: Secondary | ICD-10-CM

## 2014-05-17 DIAGNOSIS — Z950 Presence of cardiac pacemaker: Secondary | ICD-10-CM

## 2014-05-17 LAB — MDC_IDC_ENUM_SESS_TYPE_INCLINIC
Date Time Interrogation Session: 20160223133939
Implantable Pulse Generator Model: 359524
Implantable Pulse Generator Serial Number: 66080052
Lead Channel Impedance Value: 663 Ohm
Lead Channel Impedance Value: 682 Ohm
Lead Channel Pacing Threshold Amplitude: 0.7 V
Lead Channel Pacing Threshold Pulse Width: 0.4 ms
Lead Channel Sensing Intrinsic Amplitude: 16.2 mV
Lead Channel Setting Pacing Amplitude: 1.6 V
MDC IDC MSMT LEADCHNL RA PACING THRESHOLD AMPLITUDE: 0.6 V
MDC IDC MSMT LEADCHNL RA PACING THRESHOLD PULSEWIDTH: 0.4 ms
MDC IDC MSMT LEADCHNL RA SENSING INTR AMPL: 2.1 mV
MDC IDC SET LEADCHNL RV PACING AMPLITUDE: 1.7 V
MDC IDC SET LEADCHNL RV PACING PULSEWIDTH: 0.4 ms

## 2014-05-17 NOTE — Assessment & Plan Note (Addendum)
She is maintaining sinus rhythm very nicely. No change in medical therapy. She is not a candidate for systemic anticoagulation secondary to her history of subdural hematoma

## 2014-05-17 NOTE — Assessment & Plan Note (Signed)
Her systolic blood pressure is elevated today. She is encouraged to reduce her salt intake. She states that typically it is much better.

## 2014-05-17 NOTE — Progress Notes (Signed)
HPI Mrs. Lampkins returns today for followup. She is a pleasant 79 yo woman with a h/o symptomatic bradycardia and syncope, s/p PPM insertion. She developed RV lead dysfunction and was found to have malfunction of her RV lead.  Interrogation of the lead demonstrated elevated pacing impedances both bipolar, and unipolar. She did not have an obvious loose set screw at the time of her RV lead revision. Since her lead revision, she has had no recurrent syncope. She had several episodes of syncope prior to the lead revision. She denies chest pain. She denies fevers or chills.  Allergies  Allergen Reactions  . Valium [Diazepam]     Makes patient hyper  . Codeine Hives and Itching    Can take with Benadryl  . Darvon [Propoxyphene Hcl] Itching    Can take with Benadryl  . Daypro [Oxaprozin] Itching    Can take with Benadryl  . Enablex [Darifenacin Hydrobromide Er] Itching    Can take with Benadryl  . Oxycodone Itching    Can take with Benadryl  . Risperdal [Risperidone] Itching    Can take with Benadryl  . Talwin [Pentazocine] Itching    Can take with Benadryl  . Vicodin [Hydrocodone-Acetaminophen] Itching    Can take with Benadryl     Current Outpatient Prescriptions  Medication Sig Dispense Refill  . acetaminophen (TYLENOL) 500 MG tablet Take 500 mg by mouth every 6 (six) hours as needed for pain.    Marland Kitchen aspirin EC 81 MG tablet Take 1 tablet (81 mg total) by mouth daily. 90 tablet 3  . B Complex Vitamins (B-COMPLEX/B-12 PO) Take by mouth.    . beclomethasone (QVAR) 40 MCG/ACT inhaler Inhale into the lungs.    . calcium citrate-vitamin D (CITRACAL+D) 315-200 MG-UNIT per tablet Take 1 tablet by mouth 2 (two) times daily.    . cholecalciferol (VITAMIN D) 400 UNITS TABS tablet Take 400 Units by mouth. 2000 units    . FLUoxetine (PROZAC) 20 MG capsule Take 60 mg by mouth daily.    . furosemide (LASIX) 20 MG tablet Take 10 mg by mouth daily.    Marland Kitchen gabapentin (NEURONTIN) 300 MG capsule Take  300 mg by mouth 3 (three) times daily.     . Homeopathic Products (LEG CRAMP RELIEF PO) Take by mouth.    Marland Kitchen ipratropium (ATROVENT) 0.06 % nasal spray Place 2 sprays into both nostrils 4 (four) times daily.    Marland Kitchen levothyroxine (SYNTHROID, LEVOTHROID) 100 MCG tablet Take 100 mcg by mouth daily.    . mirtazapine (REMERON SOL-TAB) 15 MG disintegrating tablet Take 15 mg by mouth daily.    . montelukast (SINGULAIR) 10 MG tablet Take 10 mg by mouth daily.  0  . Multiple Vitamin (MULTIVITAMIN) capsule Take 1 capsule by mouth daily.    . pindolol (VISKEN) 5 MG tablet Take 1 tablet (5 mg total) by mouth 2 (two) times daily. 60 tablet 12  . PROAIR HFA 108 (90 BASE) MCG/ACT inhaler     . ranitidine (ZANTAC) 150 MG tablet Take 150 mg by mouth 2 (two) times daily.    . simethicone (MYLICON) 80 MG chewable tablet Chew 80 mg by mouth every 6 (six) hours as needed for flatulence.     No current facility-administered medications for this visit.     Past Medical History  Diagnosis Date  . Hypothyroidism   . Depression   . Diabetes mellitus without complication   . Pacemaker   . Stroke 2000  . Headache(784.0)   .  Arthritis   . Anemia   . Incontinence of urine   . Cancer     basal cell skin cancers  . Fibromyalgia     ROS:   All systems reviewed and negative except as noted in the HPI.   Past Surgical History  Procedure Laterality Date  . Pacemaker insertion    . Basal cell cancer removal    . Abdominal hysterectomy    . Cholecystectomy    . Femur fracture surgery      bilateral  . Right ankle surgery    . Carpal tunnel release      bilateral  . Gastric bypass    . Eye surgery      right eye catarace/lens implant  . Colonoscopy    . Cholecystectomy    . Lead revision N/A 01/01/2013    Procedure: LEAD REVISION;  Surgeon: Evans Lance, MD;  Location: [facility] CATH LAB;  Service: Cardiovascular;  Laterality: N/A;     Family History  Problem Relation Age of Onset  . Congestive Heart  Failure Mother   . Heart attack Father   . Alcoholism Father   . Alcoholism Sister   . Alcoholism Brother   . Alcoholism Brother   . Throat cancer Brother      History   Social History  . Marital Status: Widowed    Spouse Name: N/A  . Number of Children: N/A  . Years of Education: N/A   Occupational History  . Not on file.   Social History Main Topics  . Smoking status: Former Smoker -- 0.50 packs/day for 4 years    Types: Cigarettes    Quit date: 08/07/1972  . Smokeless tobacco: Never Used  . Alcohol Use: Yes     Comment: wine cooler once or twice per year  . Drug Use: No  . Sexual Activity: Not on file   Other Topics Concern  . Not on file   Social History Narrative     BP 164/72 mmHg  Pulse 64  Ht 5\' 6"  (1.676 m)  Wt 135 lb 3.2 oz (61.326 kg)  BMI 21.83 kg/m2  Physical Exam:  Well appearing 79 year old woman, NAD HEENT: Unremarkable Neck: 6 cm JVD, no thyromegally Back:  No CVA tenderness Lungs:  Clear with no wheezes, rales, or rhonchi. HEART:  Regular rate rhythm, no murmurs, no rubs, no clicks Abd:  soft, positive bowel sounds, no organomegally, no rebound, no guarding Ext:  2 plus pulses, no edema, no cyanosis, no clubbing Skin:  No rashes no nodules Neuro:  CN II through XII intact, motor grossly intact   DEVICE  Normal device function.  See PaceArt for details.   Assess/Plan:

## 2014-05-17 NOTE — Patient Instructions (Signed)
Your physician wants you to follow-up in: 12 months with Dr Knox Saliva will receive a reminder letter in the mail two months in advance. If you don't receive a letter, please call our office to schedule the follow-up appointment.  Remote monitoring is used to monitor your Pacemaker of ICD from home. This monitoring reduces the number of office visits required to check your device to one time per year. It allows [place] to keep an eye on the functioning of your device to ensure it is working properly. You are scheduled for a device check from home on 08/16/14. You may send your transmission at any time that day. If you have a wireless device, the transmission will be sent automatically. After your physician reviews your transmission, you will receive a postcard with your next transmission date.

## 2014-05-17 NOTE — Assessment & Plan Note (Signed)
Her Biotronik dual-chamber pacemaker is working normally. Plan to recheck in several months.

## 2014-06-13 ENCOUNTER — Encounter: Payer: Self-pay | Admitting: Internal Medicine

## 2014-06-21 ENCOUNTER — Encounter: Payer: Self-pay | Admitting: Internal Medicine

## 2014-08-16 ENCOUNTER — Encounter: Payer: Self-pay | Admitting: Internal Medicine

## 2014-08-16 ENCOUNTER — Ambulatory Visit (INDEPENDENT_AMBULATORY_CARE_PROVIDER_SITE_OTHER): Payer: Medicare Other | Admitting: *Deleted

## 2014-08-16 DIAGNOSIS — I480 Paroxysmal atrial fibrillation: Secondary | ICD-10-CM

## 2014-08-16 LAB — CUP PACEART REMOTE DEVICE CHECK
Lead Channel Impedance Value: 605 Ohm
Lead Channel Pacing Threshold Amplitude: 0.6 V
Lead Channel Pacing Threshold Pulse Width: 0.4 ms
Lead Channel Pacing Threshold Pulse Width: 0.4 ms
Lead Channel Sensing Intrinsic Amplitude: 1.9 mV
Lead Channel Sensing Intrinsic Amplitude: 15.4 mV
Lead Channel Setting Pacing Amplitude: 1.6 V
Lead Channel Setting Pacing Amplitude: 1.7 V
Lead Channel Setting Pacing Pulse Width: 0.4 ms
MDC IDC MSMT LEADCHNL RV IMPEDANCE VALUE: 624 Ohm
MDC IDC MSMT LEADCHNL RV PACING THRESHOLD AMPLITUDE: 0.7 V
MDC IDC PG SERIAL: 66080052
MDC IDC SESS DTM: 20160524164059

## 2014-08-16 NOTE — Progress Notes (Signed)
Remote pacemaker transmission.   

## 2014-08-24 ENCOUNTER — Encounter: Payer: Self-pay | Admitting: Cardiology

## 2014-09-07 ENCOUNTER — Encounter: Payer: Self-pay | Admitting: Cardiology

## 2014-11-15 ENCOUNTER — Ambulatory Visit (INDEPENDENT_AMBULATORY_CARE_PROVIDER_SITE_OTHER): Payer: Medicare Other | Admitting: *Deleted

## 2014-11-15 DIAGNOSIS — I4891 Unspecified atrial fibrillation: Secondary | ICD-10-CM | POA: Diagnosis not present

## 2014-11-15 NOTE — Progress Notes (Signed)
Remote pacemaker transmission.   

## 2014-11-25 LAB — CUP PACEART REMOTE DEVICE CHECK
Brady Statistic RV Percent Paced: 0 %
Date Time Interrogation Session: 20160902113410
Lead Channel Impedance Value: 624 Ohm
Lead Channel Impedance Value: 663 Ohm
Lead Channel Pacing Threshold Amplitude: 0.6 V
Lead Channel Pacing Threshold Pulse Width: 0.4 ms
Lead Channel Sensing Intrinsic Amplitude: 14.7 mV
Lead Channel Sensing Intrinsic Amplitude: 2.3 mV
Lead Channel Setting Pacing Amplitude: 1.7 V
MDC IDC MSMT LEADCHNL RV PACING THRESHOLD AMPLITUDE: 0.8 V
MDC IDC MSMT LEADCHNL RV PACING THRESHOLD PULSEWIDTH: 0.4 ms
MDC IDC SET LEADCHNL RA PACING AMPLITUDE: 1.6 V
MDC IDC SET LEADCHNL RV PACING PULSEWIDTH: 0.4 ms
MDC IDC STAT BRADY RA PERCENT PACED: 55 %
Pulse Gen Model: 359524
Pulse Gen Serial Number: 66080052

## 2014-12-07 ENCOUNTER — Encounter: Payer: Self-pay | Admitting: Cardiology

## 2014-12-15 ENCOUNTER — Encounter: Payer: Self-pay | Admitting: Internal Medicine

## 2014-12-23 ENCOUNTER — Encounter: Payer: Self-pay | Admitting: Cardiology

## 2015-02-20 ENCOUNTER — Ambulatory Visit (INDEPENDENT_AMBULATORY_CARE_PROVIDER_SITE_OTHER): Payer: Medicare Other | Admitting: *Deleted

## 2015-02-20 DIAGNOSIS — I4891 Unspecified atrial fibrillation: Secondary | ICD-10-CM | POA: Diagnosis not present

## 2015-02-20 NOTE — Progress Notes (Signed)
Remote pacemaker transmission.   

## 2015-02-28 ENCOUNTER — Encounter: Payer: Self-pay | Admitting: Cardiology

## 2015-02-28 LAB — CUP PACEART REMOTE DEVICE CHECK
Brady Statistic RV Percent Paced: 0 %
Date Time Interrogation Session: 20161206100030
Implantable Lead Implant Date: 20141010
Implantable Lead Location: 753860
Implantable Lead Model: 346369
Implantable Lead Model: 350974
Implantable Lead Serial Number: 28756637
Implantable Lead Serial Number: 29491855
Lead Channel Impedance Value: 644 Ohm
Lead Channel Pacing Threshold Amplitude: 0.7 V
Lead Channel Pacing Threshold Pulse Width: 0.4 ms
Lead Channel Sensing Intrinsic Amplitude: 16.2 mV
Lead Channel Sensing Intrinsic Amplitude: 2.1 mV
Lead Channel Setting Pacing Amplitude: 1.7 V
Lead Channel Setting Pacing Pulse Width: 0.4 ms
MDC IDC LEAD IMPLANT DT: 20110523
MDC IDC LEAD LOCATION: 753859
MDC IDC MSMT LEADCHNL RA PACING THRESHOLD PULSEWIDTH: 0.4 ms
MDC IDC MSMT LEADCHNL RV IMPEDANCE VALUE: 624 Ohm
MDC IDC MSMT LEADCHNL RV PACING THRESHOLD AMPLITUDE: 0.8 V
MDC IDC PG SERIAL: 66080052
MDC IDC SET LEADCHNL RA PACING AMPLITUDE: 1.6 V
MDC IDC STAT BRADY RA PERCENT PACED: 54 %

## 2015-03-14 ENCOUNTER — Encounter: Payer: Self-pay | Admitting: Cardiology

## 2015-04-30 LAB — CBC AND DIFFERENTIAL
HEMATOCRIT: 32 % — AB (ref 36–46)
HEMOGLOBIN: 10 g/dL — AB (ref 12.0–16.0)
Platelets: 215 10*3/uL (ref 150–399)
WBC: 7.2 10*3/mL

## 2015-04-30 LAB — HEPATIC FUNCTION PANEL
ALK PHOS: 103 U/L (ref 25–125)
ALT: 11 U/L (ref 7–35)
AST: 18 U/L (ref 13–35)
BILIRUBIN, TOTAL: 0.2 mg/dL

## 2015-05-01 LAB — BASIC METABOLIC PANEL
BUN: 17 mg/dL (ref 4–21)
Creatinine: 0.5 mg/dL (ref 0.5–1.1)
Glucose: 141 mg/dL
POTASSIUM: 3.9 mmol/L (ref 3.4–5.3)
SODIUM: 139 mmol/L (ref 137–147)

## 2015-05-05 ENCOUNTER — Non-Acute Institutional Stay (SKILLED_NURSING_FACILITY): Payer: Medicare Other | Admitting: Internal Medicine

## 2015-05-05 ENCOUNTER — Encounter: Payer: Self-pay | Admitting: Internal Medicine

## 2015-05-05 DIAGNOSIS — I10 Essential (primary) hypertension: Secondary | ICD-10-CM

## 2015-05-05 DIAGNOSIS — R55 Syncope and collapse: Secondary | ICD-10-CM | POA: Diagnosis not present

## 2015-05-05 DIAGNOSIS — E1140 Type 2 diabetes mellitus with diabetic neuropathy, unspecified: Secondary | ICD-10-CM | POA: Diagnosis not present

## 2015-05-05 DIAGNOSIS — G629 Polyneuropathy, unspecified: Secondary | ICD-10-CM | POA: Diagnosis not present

## 2015-05-05 DIAGNOSIS — R296 Repeated falls: Secondary | ICD-10-CM

## 2015-05-05 DIAGNOSIS — S42301S Unspecified fracture of shaft of humerus, right arm, sequela: Secondary | ICD-10-CM | POA: Diagnosis not present

## 2015-05-05 DIAGNOSIS — F329 Major depressive disorder, single episode, unspecified: Secondary | ICD-10-CM

## 2015-05-05 DIAGNOSIS — K5900 Constipation, unspecified: Secondary | ICD-10-CM | POA: Diagnosis not present

## 2015-05-05 DIAGNOSIS — D649 Anemia, unspecified: Secondary | ICD-10-CM | POA: Diagnosis not present

## 2015-05-05 DIAGNOSIS — R531 Weakness: Secondary | ICD-10-CM | POA: Diagnosis not present

## 2015-05-05 DIAGNOSIS — I25810 Atherosclerosis of coronary artery bypass graft(s) without angina pectoris: Secondary | ICD-10-CM | POA: Diagnosis not present

## 2015-05-05 NOTE — Progress Notes (Signed)
Patient ID: Meredith Gonzalez, female   DOB: April 13, 1935, 80 y.o.   MRN: [id number]    LOCATION: [facility]  PCP: Sinclair Ship, MD   Code Status: Full Code  Goals of care: Advanced Directive information Advanced Directives 01/01/2013  Does patient have an advance directive? Patient has advance directive, copy not in chart  Type of Advance Directive [place];Living will  Copy of advanced directive(s) in chart? Copy requested from family  Pre-existing out of facility DNR order (yellow form or pink MOST form) No    Extended Emergency Contact Information Primary Emergency Contact: Jarrard,Kevin Address: [address]          [place], [place] [address] [place] Phone: [phone] Mobile Phone: [phone] Relation: Son   Allergies  Allergen Reactions  . Valium [Diazepam]     Makes patient hyper  . Codeine Hives and Itching    Can take with Benadryl  . Darvon [Propoxyphene Hcl] Itching    Can take with Benadryl  . Daypro [Oxaprozin] Itching    Can take with Benadryl  . Enablex [Darifenacin Hydrobromide Er] Itching    Can take with Benadryl  . Oxycodone Itching    Can take with Benadryl  . Risperdal [Risperidone] Itching    Can take with Benadryl  . Talwin [Pentazocine] Itching    Can take with Benadryl  . Vicodin [Hydrocodone-Acetaminophen] Itching    Can take with Benadryl    Chief Complaint  Patient presents with  . New Admit To SNF    New Admission     HPI:  Patient is a 80 y.o. female seen today for short term rehabilitation post hospital admission from 04/30/15-05/04/15 post fall after a syncope with closed fracture of proximal end of right humerus. She was seen by orthopedics. No surgical intervention was done. She had a sling in place and pain management and therapy was recommended. The syncope has been a recurrent issue for her and she is being followed by cardiology as outpatient and has a pacemaker that has been interrogated  several times and no dysrythmia found. No further workup for syncope was done this admission. She is seen in her room today. Her pain is under control at present. She mentions having itching with percocet and would like benadryl as needed for itching. She complaints of occasional headache. No other concerns.   Review of Systems:  Constitutional: Negative for fever, chills, diaphoresis.  HENT: Negative for headache, congestion, nasal discharge Eyes: Negative for blurred vision, double vision and discharge.  Respiratory: Negative for cough, shortness of breath and wheezing.   Cardiovascular: Negative for chest pain, palpitations, leg swelling.  Gastrointestinal: Negative for heartburn, nausea, vomiting, abdominal pain. Has loose stool.  Genitourinary: Negative for dysuria Musculoskeletal: Negative for back pain, fall in the facility Skin: Negative for itching, rash.  Neurological: Negative for dizziness Psychiatric/Behavioral: Negative for depression   Past Medical History  Diagnosis Date  . Hypothyroidism   . Depression   . Diabetes mellitus without complication ([place])   . Pacemaker   . Stroke ([place]) 2000  . Headache(784.0)   . Arthritis   . Anemia   . Incontinence of urine   . Cancer ([place])     basal cell skin cancers  . Fibromyalgia    Past Surgical History  Procedure Laterality Date  . Pacemaker insertion    . Basal cell cancer removal    . Abdominal hysterectomy    . Cholecystectomy    . Femur fracture surgery  bilateral  . Right ankle surgery    . Carpal tunnel release      bilateral  . Gastric bypass    . Eye surgery      right eye catarace/lens implant  . Colonoscopy    . Cholecystectomy    . Lead revision N/A 01/01/2013    Procedure: LEAD REVISION;  Surgeon: Evans Lance, MD;  Location: [facility] CATH LAB;  Service: Cardiovascular;  Laterality: N/A;   Social History:   reports that she quit smoking about 42 years ago. Her smoking use included Cigarettes. She has  a 2 pack-year smoking history. She has never used smokeless tobacco. She reports that she drinks alcohol. She reports that she does not use illicit drugs.  Family History  Problem Relation Age of Onset  . Congestive Heart Failure Mother   . Heart attack Father   . Alcoholism Father   . Alcoholism Sister   . Alcoholism Brother   . Alcoholism Brother   . Throat cancer Brother     Medications:   Medication List       This list is accurate as of: 05/05/15 11:43 AM.  Always use your most recent med list.               acetaminophen 500 MG tablet  Commonly known as:  TYLENOL  Take 500 mg by mouth daily.     amitriptyline 25 MG tablet  Commonly known as:  ELAVIL  Take 25 mg by mouth at bedtime.     aspirin EC 81 MG tablet  Take 1 tablet (81 mg total) by mouth daily.     calcium citrate-vitamin D 315-200 MG-UNIT tablet  Commonly known as:  CITRACAL+D  Take 1 tablet by mouth daily.     FLUoxetine 20 MG capsule  Commonly known as:  PROZAC  Take 60 mg by mouth daily.     gabapentin 300 MG capsule  Commonly known as:  NEURONTIN  Take 300 mg by mouth at bedtime. Give 2 tablets (600 mg) for pain     gabapentin 300 MG capsule  Commonly known as:  NEURONTIN  Take 300 mg by mouth every morning.     insulin lispro 100 UNIT/ML injection  Commonly known as:  HUMALOG  Inject 2-12 Units into the skin 3 (three) times daily before meals. 141-180 = 2 units, 181-200= 4 units, 221-260= 6 units, 261-300= 8 units, 301-340= 10 units, > 341 12 units and call MD     multivitamin capsule  Take 1 capsule by mouth daily.     pindolol 5 MG tablet  Commonly known as:  VISKEN  Take 1 tablet (5 mg total) by mouth 2 (two) times daily.     senna-docusate 8.6-50 MG tablet  Commonly known as:  Senokot-S  Take 2 tablets by mouth daily as needed for mild constipation.     TYLENOL PM EXTRA STRENGTH PO  Take 1 tablet by mouth at bedtime as needed.        Physical Exam: Filed Vitals:    05/05/15 0835  BP: 130/67  Pulse: 80  Temp: 97.9 F (36.6 C)  TempSrc: Oral  Resp: 18  Height: 5\' 4"  (1.626 m)  Weight: 126 lb (57.153 kg)  SpO2: 94%   Body mass index is 21.62 kg/(m^2).  General- elderly female, well built, in no acute distress Head- normocephalic, atraumatic Nose- no maxillary or frontal sinus tenderness, no nasal discharge Throat- moist mucus membrane  Eyes- PERRLA, EOMI, no pallor, no icterus,  no discharge, normal conjunctiva, normal sclera Neck- no cervical lymphadenopathy Cardiovascular- normal s1,s2, no murmurs, palpable dorsalis pedis pulses, no edema Respiratory- bilateral clear to auscultation, no wheeze, no rhonchi, no crackles, no use of accessory muscles Abdomen- bowel sounds present, soft, non tender Musculoskeletal- able to move all 4 extremities, right arm in sling, able to move her fingers and has good capillary refill, unsteady gait Neurological- alert and oriented to person, place and time Skin- warm and dry, bruise  her knee Psychiatry- normal mood and affect   Labs reviewed: Basic Metabolic Panel:  Recent Labs  05/01/15  NA 139  K 3.9  BUN 17  CREATININE 0.5   Liver Function Tests:  Recent Labs  04/30/15  AST 18  ALT 11  ALKPHOS 103   No results for input(s): LIPASE, AMYLASE in the last 8760 hours. No results for input(s): AMMONIA in the last 8760 hours. CBC:  Recent Labs  04/30/15  WBC 7.2  HGB 10.0*  HCT 32*  PLT 215    Assessment/Plan  Generalized weakness Will have her work with physical therapy and occupational therapy team to help with gait training and muscle strengthening exercises.fall precautions. Skin care. Encourage to be out of bed.   Right humerus fracture Has sling in place and is to be NWB to RUE. Continue tylenol as needed and percocet 5-325 mg q4h prn pain. Add benadryl 25 mg q8h prn itching. Has f/u with orthopedics  Recurrent falls Will need gait training and safe transfer training. Fall  precautions. Discontinue tylenol pm.  History of multiple fracture Continue citracal and vitamin d supplement  Chronic depression Mood currently stable. Continue prozac and amitriptyline for now  Syncope Unclear etiology, has a pacemaker, f/u with cardiology on as needed basis  Neuropathy Continue gabapentin 300 mg am and 600 mg pm  Constipation Has loose stool at present. On prn senokot s, monitor  CAD Remains chest pain free. Continue baby aspirin and pindolol for now  DM No results found for: HGBA1C Check a1c. Currently on SSI insulin, monitor cbg  Anemia  Unspecified, possible anemia of chronic disease. Check cbc  HTN Stable bp, monitor BP daily for now and continue pindolol   Goals of care: short term rehabilitation   Labs/tests ordered: cbc, bmp 1 week  Family/ staff Communication: reviewed care plan with patient and nursing supervisor    Blanchie Serve, MD Internal Medicine [place], [place] [address] Cell Phone (Monday-Friday 8 am - 5 pm): [phone] On Call: [phone] and follow prompts after 5 pm and on weekends Office Phone: [phone] Office Fax: [phone]

## 2015-05-08 LAB — CBC AND DIFFERENTIAL
HEMATOCRIT: 26 % — AB (ref 36–46)
HEMOGLOBIN: 7.9 g/dL — AB (ref 12.0–16.0)
Neutrophils Absolute: 4 /uL
PLATELETS: 324 10*3/uL (ref 150–399)
WBC: 6.2 10*3/mL

## 2015-05-12 LAB — CBC AND DIFFERENTIAL
HEMATOCRIT: 28 % — AB (ref 36–46)
Hemoglobin: 8.4 g/dL — AB (ref 12.0–16.0)
Neutrophils Absolute: 5 /uL
Platelets: 372 10*3/uL (ref 150–399)
WBC: 6.6 10^3/mL

## 2015-05-23 LAB — CBC AND DIFFERENTIAL
HCT: 29 % — AB (ref 36–46)
Hemoglobin: 8.7 g/dL — AB (ref 12.0–16.0)
NEUTROS ABS: 3 /uL
PLATELETS: 252 10*3/uL (ref 150–399)
WBC: 4.8 10^3/mL

## 2015-05-24 ENCOUNTER — Encounter: Payer: Self-pay | Admitting: *Deleted

## 2015-05-29 ENCOUNTER — Encounter: Payer: Self-pay | Admitting: Adult Health

## 2015-05-29 NOTE — Progress Notes (Signed)
Patient ID: Meredith Gonzalez, female   DOB: 25-Oct-1935, 80 y.o.   MRN: [id number]   This encounter was created in error - please disregard.

## 2015-05-29 NOTE — Progress Notes (Signed)
This encounter was created in error - please disregard.

## 2015-05-29 NOTE — Progress Notes (Deleted)
This encounter was created in error - please disregard. This encounter was created in error - please disregard. This encounter was created in error - please disregard. 

## 2015-06-12 ENCOUNTER — Encounter: Payer: Self-pay | Admitting: Adult Health

## 2015-06-12 NOTE — Progress Notes (Signed)
Patient ID: Meredith Gonzalez, female   DOB: 07-13-1935, 80 y.o.   MRN: [id number]   This encounter was created in error - please disregard.

## 2015-08-18 ENCOUNTER — Encounter: Payer: Self-pay | Admitting: Internal Medicine

## 2015-08-18 ENCOUNTER — Ambulatory Visit (INDEPENDENT_AMBULATORY_CARE_PROVIDER_SITE_OTHER): Payer: Medicare Other | Admitting: Internal Medicine

## 2015-08-18 VITALS — BP 128/74 | HR 69 | Ht 64.0 in | Wt 121.2 lb

## 2015-08-18 DIAGNOSIS — I25810 Atherosclerosis of coronary artery bypass graft(s) without angina pectoris: Secondary | ICD-10-CM | POA: Diagnosis not present

## 2015-08-18 DIAGNOSIS — I10 Essential (primary) hypertension: Secondary | ICD-10-CM | POA: Diagnosis not present

## 2015-08-18 NOTE — Progress Notes (Signed)
Cardiology Office Note   Date:  08/18/2015   ID:  Walaa Rix, DOB [birth year], MRN [id number]  PCP:  Sinclair Ship, MD  Cardiologist:   Dorris Carnes, MD   No chief complaint on file.     History of Present Illness: Meredith Gonzalez is a 80 y.o. female with a history ofHTN, hypothyroidism, atrial fib, fall with subdural hematoma, tib fx and wrist fracture and syncope S/p PPM a few years ago.  Cath in 1995 No CAD LVEF 55% Nuclear stress test 2013 No ischemia. Tilt table 2014 for syncope. Reported neg but review showed signif drop in BP t I stopped Xarelto with history of syncope After I saw her earlier this fall she was seen by Beckie Salts Found to have a fractured lead in pacemker Had lead revision  When records finally obtained from [place] for tilt table Diltiazem and amlodipine was d/c'd and she was placed on pindolol  I saw the pt last in 2015  She has been seen  Since seen she denies CP  No SOB   No dizziness  No passing out   [place] recently  Triopped  Stubbed toe   Outpatient Prescriptions Prior to Visit  Medication Sig Dispense Refill  . acetaminophen (TYLENOL) 500 MG tablet Take 500 mg by mouth daily.     Marland Kitchen amitriptyline (ELAVIL) 25 MG tablet Take 25 mg by mouth at bedtime.    Marland Kitchen aspirin EC 81 MG tablet Take 1 tablet (81 mg total) by mouth daily. 90 tablet 3  . Calcium Citrate-Vitamin D3 315-250 MG-UNIT TABS Take 1 tablet by mouth 2 (two) times daily.    . diphenhydrAMINE (BENADRYL ALLERGY) 25 MG tablet Take 25 mg by mouth every 8 (eight) hours as needed.    Marland Kitchen FLUoxetine HCl 60 MG TABS Take 60 mg by mouth daily.    Marland Kitchen gabapentin (NEURONTIN) 300 MG capsule Take 300 mg by mouth every morning.     . gabapentin (NEURONTIN) 300 MG capsule Take 600 mg by mouth at bedtime. Give 2 tablets (600 mg) for pain    . Multiple Vitamins-Minerals (MULTIVITAMIN ADULT) TABS Take 1 tablet by mouth daily.    . pindolol (VISKEN) 5 MG tablet Take 1 tablet (5 mg total) by mouth 2 (two)  times daily. 60 tablet 12  . insulin lispro (HUMALOG) 100 UNIT/ML injection Inject 2-12 Units into the skin 3 (three) times daily before meals. Reported on 08/18/2015    . senna-docusate (SENOKOT-S) 8.6-50 MG tablet Take 2 tablets by mouth daily as needed for mild constipation. Reported on 08/18/2015     No facility-administered medications prior to visit.     Allergies:   Valium; Darifenacin; Darvon; Daypro; Enablex; Propoxyphene; Risperdal; Talwin; Vicodin; Codeine; Diphenhydramine-acetaminophen; and Oxycodone   Past Medical History  Diagnosis Date  . Hypothyroidism   . Major depression, chronic ([place])   . Diabetes mellitus without complication ([place])   . Pacemaker   . Stroke ([place]) 2000  . Headache(784.0)   . Arthritis   . Anemia   . Incontinence of urine   . Cancer ([place])     basal cell skin cancers  . Fibromyalgia   . Generalized weakness   . Fracture of right humerus   . Coronary artery disease involving coronary bypass graft of native heart   . Neuropathy ([place])   . Recurrent falls   . Syncope and collapse   . Constipation     Past Surgical History  Procedure Laterality Date  . Pacemaker insertion    .  Basal cell cancer removal    . Abdominal hysterectomy    . Cholecystectomy    . Femur fracture surgery      bilateral  . Right ankle surgery    . Carpal tunnel release      bilateral  . Gastric bypass    . Eye surgery      right eye catarace/lens implant  . Colonoscopy    . Cholecystectomy    . Lead revision N/A 01/01/2013    Procedure: LEAD REVISION;  Surgeon: Evans Lance, MD;  Location: [facility] CATH LAB;  Service: Cardiovascular;  Laterality: N/A;     Social History:  The patient  reports that she quit smoking about 43 years ago. Her smoking use included Cigarettes. She has a 2 pack-year smoking history. She has never used smokeless tobacco. She reports that she drinks alcohol. She reports that she does not use illicit drugs.   Family History:  The patient's  family history includes Alcoholism in her brother, brother, father, and sister; Congestive Heart Failure in her mother; Heart attack in her father; Throat cancer in her brother.    ROS:  Please see the history of present illness. All other systems are reviewed and  Negative to the above problem except as noted.    PHYSICAL EXAM: VS:  BP 128/74 mmHg  Pulse 69  Ht 5\' 4"  (1.626 m)  Wt 121 lb 3.2 oz (54.976 kg)  BMI 20.79 kg/m2  GEN: Well nourished, well developed, in no acute distress HEENT: normal Neck: no JVD, carotid bruits, or masses Cardiac: RRR; no murmurs, rubs, or gallops,no edema  Respiratory:  clear to auscultation bilaterally, normal work of breathing GI: soft, nontender, nondistended, + BS  No hepatomegaly  MS: no deformity Moving all extremities   Skin: warm and dry, no rash Neuro:  Strength and sensation are intact Psych: euthymic mood, full affect   EKG:  EKG is ordered today.  SR  Atrial paced  69  First degree AV block  PR 234 msec   LVH     Lipid Panel No results found for: CHOL, TRIG, HDL, CHOLHDL, VLDL, LDLCALC, LDLDIRECT    Wt Readings from Last 3 Encounters:  08/18/15 121 lb 3.2 oz (54.976 kg)  06/12/15 124 lb (56.246 kg)  05/29/15 119 lb (53.978 kg)      ASSESSMENT AND PLAN:  1  Bradycardia  S/p PPM  Followed by Beckie Salts    2.  Afib  No clinical recurrence.  Not an anticoag candidate due to subdural hematoma    Will need f/u with Beckie Salts   I will review when I need to see her      Signed, Dorris Carnes, MD  08/18/2015 8:45 AM    [address], [place], [place]  [address] Phone: [phone]; Fax: [phone]

## 2015-08-18 NOTE — Patient Instructions (Signed)
Your physician recommends that you continue on your current medications as directed. Please refer to the Current Medication list given to you today. Your physician recommends that you schedule a follow-up appointment in: WITH DR. Lovena Le.  WE WILL CALL YOU TO SCHEDULE.

## 2015-08-30 NOTE — Progress Notes (Signed)
Have pt see me in Feb 2018

## 2015-09-14 ENCOUNTER — Encounter: Payer: Self-pay | Admitting: Internal Medicine

## 2015-09-14 ENCOUNTER — Ambulatory Visit (INDEPENDENT_AMBULATORY_CARE_PROVIDER_SITE_OTHER): Payer: Medicare Other | Admitting: Internal Medicine

## 2015-09-14 VITALS — BP 112/50 | HR 78 | Ht 66.0 in | Wt 123.0 lb

## 2015-09-14 DIAGNOSIS — I480 Paroxysmal atrial fibrillation: Secondary | ICD-10-CM

## 2015-09-14 DIAGNOSIS — I25810 Atherosclerosis of coronary artery bypass graft(s) without angina pectoris: Secondary | ICD-10-CM | POA: Diagnosis not present

## 2015-09-14 LAB — CUP PACEART INCLINIC DEVICE CHECK
Implantable Lead Implant Date: 20110523
Implantable Lead Location: 753859
Implantable Lead Serial Number: 28756637
Implantable Lead Serial Number: 29491855
Lead Channel Impedance Value: 663 Ohm
Lead Channel Pacing Threshold Amplitude: 0.6 V
Lead Channel Pacing Threshold Amplitude: 0.6 V
Lead Channel Pacing Threshold Pulse Width: 0.4 ms
Lead Channel Pacing Threshold Pulse Width: 0.4 ms
Lead Channel Setting Pacing Amplitude: 1.7 V
MDC IDC LEAD IMPLANT DT: 20141010
MDC IDC LEAD LOCATION: 753860
MDC IDC LEAD MODEL: 346369
MDC IDC LEAD MODEL: 350974
MDC IDC MSMT LEADCHNL RA IMPEDANCE VALUE: 721 Ohm
MDC IDC MSMT LEADCHNL RA PACING THRESHOLD PULSEWIDTH: 0.4 ms
MDC IDC MSMT LEADCHNL RA SENSING INTR AMPL: 2.2 mV
MDC IDC MSMT LEADCHNL RV PACING THRESHOLD AMPLITUDE: 0.7 V
MDC IDC MSMT LEADCHNL RV PACING THRESHOLD AMPLITUDE: 0.7 V
MDC IDC MSMT LEADCHNL RV PACING THRESHOLD PULSEWIDTH: 0.4 ms
MDC IDC MSMT LEADCHNL RV SENSING INTR AMPL: 15.7 mV
MDC IDC SESS DTM: 20170622101539
MDC IDC SET LEADCHNL RA PACING AMPLITUDE: 1.6 V
MDC IDC SET LEADCHNL RV PACING PULSEWIDTH: 0.4 ms
MDC IDC STAT BRADY RA PERCENT PACED: 53 %
MDC IDC STAT BRADY RV PERCENT PACED: 0 %
Pulse Gen Serial Number: 66080052

## 2015-09-14 NOTE — Progress Notes (Signed)
HPI Mrs. Kistner returns today for followup. She is a pleasant 80 yo woman with a h/o symptomatic bradycardia and syncope, s/p PPM insertion. She developed RV lead dysfunction and has undergone lead revision. She denies chest pain. She denies fevers or chills. The patient has continued to fall. She has bruises.  Allergies  Allergen Reactions  . Valium [Diazepam] Anxiety    Makes patient hyper Makes patient hyper  . Darifenacin Itching    Can take with Benadryl  . Darvon [Propoxyphene Hcl] Itching    Can take with Benadryl  . Daypro [Oxaprozin] Itching    Can take with Benadryl Can take with Benadryl  . Enablex [Darifenacin Hydrobromide Er] Itching    Can take with Benadryl  . Propoxyphene Itching    Can take with Benadryl  . Risperdal [Risperidone] Itching    Can take with Benadryl Can take with Benadryl  . Talwin [Pentazocine] Itching    Can take with Benadryl Can take with Benadryl  . Vicodin [Hydrocodone-Acetaminophen] Itching    Can take with Benadryl Can take with Benadryl  . Codeine Hives and Itching    Can take with Benadryl Can take with Benadryl  . Diphenhydramine-Acetaminophen Rash    If taken with benadryl no reaction  . Oxycodone Itching    Can take with Benadryl Can take with Benadryl Can take with Benadryl Can take with Benadryl     Current Outpatient Prescriptions  Medication Sig Dispense Refill  . acetaminophen (TYLENOL) 500 MG tablet Take 500 mg by mouth daily.     Marland Kitchen amitriptyline (ELAVIL) 25 MG tablet Take 25 mg by mouth at bedtime.    Marland Kitchen aspirin EC 81 MG tablet Take 1 tablet (81 mg total) by mouth daily. 90 tablet 3  . Calcium Citrate-Vitamin D3 315-250 MG-UNIT TABS Take 1 tablet by mouth 2 (two) times daily.    . diphenhydrAMINE (BENADRYL ALLERGY) 25 MG tablet Take 25 mg by mouth every 8 (eight) hours as needed.    Marland Kitchen FLUoxetine (PROZAC) 20 MG capsule Take 1 capsule by mouth 3 (three) times daily.  0  . gabapentin (NEURONTIN) 300 MG capsule Take  900 mg by mouth 2 (two) times daily. Give 2 tablets (600 mg) for pain    . Multiple Vitamins-Minerals (MULTIVITAMIN ADULT) TABS Take 1 tablet by mouth daily.    . pindolol (VISKEN) 5 MG tablet Take 1 tablet (5 mg total) by mouth 2 (two) times daily. 60 tablet 12   No current facility-administered medications for this visit.     Past Medical History  Diagnosis Date  . Hypothyroidism   . Major depression, chronic ([place])   . Diabetes mellitus without complication ([place])   . Pacemaker   . Stroke ([place]) 2000  . Headache(784.0)   . Arthritis   . Anemia   . Incontinence of urine   . Cancer ([place])     basal cell skin cancers  . Fibromyalgia   . Generalized weakness   . Fracture of right humerus   . Coronary artery disease involving coronary bypass graft of native heart   . Neuropathy ([place])   . Recurrent falls   . Syncope and collapse   . Constipation     ROS:   All systems reviewed and negative except as noted in the HPI.   Past Surgical History  Procedure Laterality Date  . Pacemaker insertion    . Basal cell cancer removal    . Abdominal hysterectomy    . Cholecystectomy    .  Femur fracture surgery      bilateral  . Right ankle surgery    . Carpal tunnel release      bilateral  . Gastric bypass    . Eye surgery      right eye catarace/lens implant  . Colonoscopy    . Cholecystectomy    . Lead revision N/A 01/01/2013    Procedure: LEAD REVISION;  Surgeon: Evans Lance, MD;  Location: [facility] CATH LAB;  Service: Cardiovascular;  Laterality: N/A;     Family History  Problem Relation Age of Onset  . Congestive Heart Failure Mother   . Heart attack Father   . Alcoholism Father   . Alcoholism Sister   . Alcoholism Brother   . Alcoholism Brother   . Throat cancer Brother      Social History   Social History  . Marital Status: Widowed    Spouse Name: N/A  . Number of Children: N/A  . Years of Education: N/A   Occupational History  . Not on file.   Social  History Main Topics  . Smoking status: Former Smoker -- 0.50 packs/day for 4 years    Types: Cigarettes    Quit date: 08/07/1972  . Smokeless tobacco: Never Used  . Alcohol Use: Yes     Comment: wine cooler once or twice per year  . Drug Use: No  . Sexual Activity: Not on file   Other Topics Concern  . Not on file   Social History Narrative     BP 112/50 mmHg  Pulse 78  Ht 5\' 6"  (1.676 m)  Wt 123 lb (55.792 kg)  BMI 19.86 kg/m2  SpO2 95%  Physical Exam:  Well appearing 80 year old woman, NAD HEENT: Unremarkable Neck: 6 cm JVD, no thyromegally Back:  No CVA tenderness Lungs:  Clear with no wheezes, rales, or rhonchi. HEART:  Regular rate rhythm, no murmurs, no rubs, no clicks Abd:  soft, positive bowel sounds, no organomegally, no rebound, no guarding Ext:  2 plus pulses, no edema, no cyanosis, no clubbing Skin:  No rashes no nodules Neuro:  CN II through XII intact, motor grossly intact   DEVICE  Normal device function.  See PaceArt for details.   Assess/Plan: 1. Syncope - I suspect autonomic dysfunction. I have asked her to eat more salt. 2. HTN - her blood pressure is on the low side. 3. PPM - her biotronik DDD PM is working normally.  Mikle Bosworth.D.

## 2015-09-14 NOTE — Patient Instructions (Signed)
Medication Instructions:  Your physician recommends that you continue on your current medications as directed. Please refer to the Current Medication list given to you today.  Labwork: None ordered  Testing/Procedures: None ordered  Follow-Up: Remote monitoring is used to monitor your Pacemaker of ICD from home. This monitoring reduces the number of office visits required to check your device to one time per year. It allows [place] to keep an eye on the functioning of your device to ensure it is working properly. You are scheduled for a device check from home on 12/14/2015. You may send your transmission at any time that day. If you have a wireless device, the transmission will be sent automatically. After your physician reviews your transmission, you will receive a postcard with your next transmission date.  Your physician wants you to follow-up in: 1 year with Dr. Lovena Le. You will receive a reminder letter in the mail two months in advance. If you don't receive a letter, please call our office to schedule the follow-up appointment.  If you need a refill on your cardiac medications before your next appointment, please call your pharmacy.  Thank you for choosing [facility]!!

## 2015-12-14 ENCOUNTER — Ambulatory Visit (INDEPENDENT_AMBULATORY_CARE_PROVIDER_SITE_OTHER): Payer: Medicare Other | Admitting: *Deleted

## 2015-12-14 DIAGNOSIS — I4891 Unspecified atrial fibrillation: Secondary | ICD-10-CM | POA: Diagnosis not present

## 2015-12-14 DIAGNOSIS — I480 Paroxysmal atrial fibrillation: Secondary | ICD-10-CM

## 2015-12-14 NOTE — Progress Notes (Signed)
Remote pacemaker transmission.   

## 2015-12-15 ENCOUNTER — Encounter: Payer: Self-pay | Admitting: Cardiology

## 2015-12-29 ENCOUNTER — Encounter: Payer: Self-pay | Admitting: Cardiology

## 2016-01-11 LAB — CUP PACEART REMOTE DEVICE CHECK
Brady Statistic RA Percent Paced: 50 %
Brady Statistic RV Percent Paced: 0 %
Date Time Interrogation Session: 20171019102139
Implantable Lead Implant Date: 20110523
Implantable Lead Implant Date: 20141010
Implantable Lead Location: 753859
Implantable Lead Location: 753860
Implantable Lead Model: 346369
Implantable Lead Model: 350974
Implantable Lead Serial Number: 28756637
Implantable Lead Serial Number: 29491855
Lead Channel Impedance Value: 624 Ohm
Lead Channel Impedance Value: 644 Ohm
Lead Channel Pacing Threshold Amplitude: 0.6 V
Lead Channel Pacing Threshold Amplitude: 0.7 V
Lead Channel Pacing Threshold Pulse Width: 0.4 ms
Lead Channel Pacing Threshold Pulse Width: 0.4 ms
Lead Channel Sensing Intrinsic Amplitude: 1 mV
Lead Channel Sensing Intrinsic Amplitude: 13.4 mV
Lead Channel Setting Pacing Amplitude: 1.6 V
Lead Channel Setting Pacing Amplitude: 1.7 V
Lead Channel Setting Pacing Pulse Width: 0.4 ms
Pulse Gen Serial Number: 66080052

## 2016-03-14 ENCOUNTER — Ambulatory Visit (INDEPENDENT_AMBULATORY_CARE_PROVIDER_SITE_OTHER): Payer: Medicare Other | Admitting: *Deleted

## 2016-03-14 DIAGNOSIS — Z950 Presence of cardiac pacemaker: Secondary | ICD-10-CM

## 2016-03-14 NOTE — Progress Notes (Signed)
Remote pacemaker transmission.   

## 2016-03-22 LAB — CUP PACEART REMOTE DEVICE CHECK
Brady Statistic RA Percent Paced: 52 %
Brady Statistic RV Percent Paced: 0 %
Date Time Interrogation Session: 20171229160335
Implantable Lead Implant Date: 20110523
Implantable Lead Implant Date: 20141010
Implantable Lead Location: 753859
Implantable Lead Location: 753860
Implantable Lead Model: 346369
Implantable Lead Model: 350974
Implantable Lead Serial Number: 28756637
Implantable Lead Serial Number: 29491855
Implantable Pulse Generator Implant Date: 20110523
Lead Channel Impedance Value: 605 Ohm
Lead Channel Impedance Value: 644 Ohm
Lead Channel Pacing Threshold Amplitude: 0.7 V
Lead Channel Pacing Threshold Amplitude: 0.8 V
Lead Channel Pacing Threshold Pulse Width: 0.4 ms
Lead Channel Pacing Threshold Pulse Width: 0.4 ms
Lead Channel Sensing Intrinsic Amplitude: 1.7 mV
Lead Channel Sensing Intrinsic Amplitude: 15.7 mV
Lead Channel Setting Pacing Amplitude: 1.6 V
Lead Channel Setting Pacing Amplitude: 1.7 V
Lead Channel Setting Pacing Pulse Width: 0.4 ms
Pulse Gen Serial Number: 66080052

## 2016-06-13 ENCOUNTER — Ambulatory Visit (INDEPENDENT_AMBULATORY_CARE_PROVIDER_SITE_OTHER): Payer: Medicare Other | Admitting: *Deleted

## 2016-06-13 DIAGNOSIS — I480 Paroxysmal atrial fibrillation: Secondary | ICD-10-CM | POA: Diagnosis not present

## 2016-06-13 NOTE — Progress Notes (Signed)
Remote pacemaker transmission.   

## 2016-06-14 ENCOUNTER — Encounter: Payer: Self-pay | Admitting: Cardiology

## 2016-06-23 DIAGNOSIS — I219 Acute myocardial infarction, unspecified: Secondary | ICD-10-CM

## 2016-06-23 HISTORY — DX: Acute myocardial infarction, unspecified: I21.9

## 2016-06-23 HISTORY — PX: CORONARY ANGIOPLASTY WITH STENT PLACEMENT: SHX49

## 2016-06-24 LAB — CUP PACEART REMOTE DEVICE CHECK
Brady Statistic AP VP Percent: 0 %
Brady Statistic AP VS Percent: 44 %
Brady Statistic AS VP Percent: 0 %
Brady Statistic AS VS Percent: 54 %
Brady Statistic RV Percent Paced: 1 %
Implantable Lead Implant Date: 20141010
Implantable Lead Model: 346
Implantable Lead Model: 350
Implantable Lead Serial Number: 28756637
Implantable Lead Serial Number: 29491855
Lead Channel Impedance Value: 605 Ohm
Lead Channel Pacing Threshold Amplitude: 0.7 V
Lead Channel Pacing Threshold Pulse Width: 0.4 ms
MDC IDC LEAD IMPLANT DT: 20110523
MDC IDC LEAD LOCATION: 753859
MDC IDC LEAD LOCATION: 753860
MDC IDC MSMT LEADCHNL RV IMPEDANCE VALUE: 605 Ohm
MDC IDC MSMT LEADCHNL RV PACING THRESHOLD AMPLITUDE: 0.6 V
MDC IDC MSMT LEADCHNL RV PACING THRESHOLD PULSEWIDTH: 0.4 ms
MDC IDC PG IMPLANT DT: 20110523
MDC IDC PG SERIAL: 66080052
MDC IDC SESS DTM: 20180402044215
MDC IDC SET LEADCHNL RV PACING PULSEWIDTH: 0.4 ms
MDC IDC STAT BRADY RA PERCENT PACED: 44 %

## 2016-06-28 ENCOUNTER — Encounter: Payer: Self-pay | Admitting: Cardiology

## 2016-07-08 ENCOUNTER — Telehealth: Payer: Self-pay | Admitting: Internal Medicine

## 2016-07-08 NOTE — Telephone Encounter (Signed)
New Message:   Pt have been having chest pains off and on since last Wednesday.Please call her,she said her primary doctor told her to go the ER.

## 2016-07-08 NOTE — Telephone Encounter (Signed)
Patient was transferred to me with a question of which ER she should go to.    She states she has a pain in her rib cage that is severe and after about an hour goes around to her back.  It has happened once every day for last several days.  She has not done any exertional activities to cause pain.  She called her PCP and was instructed to go to the ER.   She wanted to know should she go to the ER where Dr. Harrington Challenger is.   I instructed her to go to the [facility] ER.

## 2016-07-12 ENCOUNTER — Encounter (HOSPITAL_BASED_OUTPATIENT_CLINIC_OR_DEPARTMENT_OTHER): Payer: Self-pay | Admitting: Respiratory Therapy

## 2016-07-12 ENCOUNTER — Emergency Department (HOSPITAL_BASED_OUTPATIENT_CLINIC_OR_DEPARTMENT_OTHER): Payer: Medicare Other

## 2016-07-12 ENCOUNTER — Inpatient Hospital Stay (HOSPITAL_BASED_OUTPATIENT_CLINIC_OR_DEPARTMENT_OTHER)
Admission: EM | Admit: 2016-07-12 | Discharge: 2016-07-20 | DRG: 246 | Disposition: A | Discharge status: Home / Self Care | Payer: Medicare Other | Attending: Internal Medicine | Admitting: Internal Medicine

## 2016-07-12 ENCOUNTER — Telehealth: Payer: Self-pay | Admitting: Internal Medicine

## 2016-07-12 DIAGNOSIS — S065X9A Traumatic subdural hemorrhage with loss of consciousness of unspecified duration, initial encounter: Secondary | ICD-10-CM | POA: Diagnosis present

## 2016-07-12 DIAGNOSIS — I214 Non-ST elevation (NSTEMI) myocardial infarction: Secondary | ICD-10-CM | POA: Diagnosis not present

## 2016-07-12 DIAGNOSIS — J189 Pneumonia, unspecified organism: Secondary | ICD-10-CM | POA: Diagnosis present

## 2016-07-12 DIAGNOSIS — E538 Deficiency of other specified B group vitamins: Secondary | ICD-10-CM | POA: Diagnosis present

## 2016-07-12 DIAGNOSIS — Z7982 Long term (current) use of aspirin: Secondary | ICD-10-CM

## 2016-07-12 DIAGNOSIS — R079 Chest pain, unspecified: Secondary | ICD-10-CM

## 2016-07-12 DIAGNOSIS — R9439 Abnormal result of other cardiovascular function study: Secondary | ICD-10-CM

## 2016-07-12 DIAGNOSIS — S065XAA Traumatic subdural hemorrhage with loss of consciousness status unknown, initial encounter: Secondary | ICD-10-CM | POA: Diagnosis present

## 2016-07-12 DIAGNOSIS — D72829 Elevated white blood cell count, unspecified: Secondary | ICD-10-CM

## 2016-07-12 DIAGNOSIS — Z9049 Acquired absence of other specified parts of digestive tract: Secondary | ICD-10-CM

## 2016-07-12 DIAGNOSIS — I2510 Atherosclerotic heart disease of native coronary artery without angina pectoris: Secondary | ICD-10-CM | POA: Diagnosis present

## 2016-07-12 DIAGNOSIS — D649 Anemia, unspecified: Secondary | ICD-10-CM | POA: Diagnosis present

## 2016-07-12 DIAGNOSIS — R0602 Shortness of breath: Secondary | ICD-10-CM

## 2016-07-12 DIAGNOSIS — Z955 Presence of coronary angioplasty implant and graft: Secondary | ICD-10-CM

## 2016-07-12 DIAGNOSIS — F329 Major depressive disorder, single episode, unspecified: Secondary | ICD-10-CM | POA: Diagnosis present

## 2016-07-12 DIAGNOSIS — I110 Hypertensive heart disease with heart failure: Secondary | ICD-10-CM | POA: Diagnosis present

## 2016-07-12 DIAGNOSIS — F32A Depression, unspecified: Secondary | ICD-10-CM | POA: Diagnosis present

## 2016-07-12 DIAGNOSIS — M797 Fibromyalgia: Secondary | ICD-10-CM | POA: Diagnosis present

## 2016-07-12 DIAGNOSIS — Z951 Presence of aortocoronary bypass graft: Secondary | ICD-10-CM

## 2016-07-12 DIAGNOSIS — R0789 Other chest pain: Secondary | ICD-10-CM

## 2016-07-12 DIAGNOSIS — Z950 Presence of cardiac pacemaker: Secondary | ICD-10-CM | POA: Diagnosis present

## 2016-07-12 DIAGNOSIS — E039 Hypothyroidism, unspecified: Secondary | ICD-10-CM | POA: Diagnosis present

## 2016-07-12 DIAGNOSIS — Z8673 Personal history of transient ischemic attack (TIA), and cerebral infarction without residual deficits: Secondary | ICD-10-CM

## 2016-07-12 DIAGNOSIS — R778 Other specified abnormalities of plasma proteins: Secondary | ICD-10-CM | POA: Diagnosis present

## 2016-07-12 DIAGNOSIS — I5043 Acute on chronic combined systolic (congestive) and diastolic (congestive) heart failure: Secondary | ICD-10-CM | POA: Diagnosis present

## 2016-07-12 DIAGNOSIS — J9601 Acute respiratory failure with hypoxia: Secondary | ICD-10-CM | POA: Diagnosis present

## 2016-07-12 DIAGNOSIS — I272 Pulmonary hypertension, unspecified: Secondary | ICD-10-CM | POA: Diagnosis present

## 2016-07-12 DIAGNOSIS — R7989 Other specified abnormal findings of blood chemistry: Secondary | ICD-10-CM | POA: Diagnosis present

## 2016-07-12 DIAGNOSIS — I10 Essential (primary) hypertension: Secondary | ICD-10-CM | POA: Diagnosis present

## 2016-07-12 DIAGNOSIS — E119 Type 2 diabetes mellitus without complications: Secondary | ICD-10-CM

## 2016-07-12 DIAGNOSIS — Z85828 Personal history of other malignant neoplasm of skin: Secondary | ICD-10-CM

## 2016-07-12 DIAGNOSIS — Z9884 Bariatric surgery status: Secondary | ICD-10-CM

## 2016-07-12 DIAGNOSIS — Z79899 Other long term (current) drug therapy: Secondary | ICD-10-CM

## 2016-07-12 DIAGNOSIS — R296 Repeated falls: Secondary | ICD-10-CM

## 2016-07-12 DIAGNOSIS — I480 Paroxysmal atrial fibrillation: Secondary | ICD-10-CM | POA: Diagnosis present

## 2016-07-12 DIAGNOSIS — Z87891 Personal history of nicotine dependence: Secondary | ICD-10-CM

## 2016-07-12 DIAGNOSIS — R55 Syncope and collapse: Secondary | ICD-10-CM | POA: Diagnosis present

## 2016-07-12 LAB — CBC
HEMATOCRIT: 31.5 % — AB (ref 36.0–46.0)
HEMOGLOBIN: 9.4 g/dL — AB (ref 12.0–15.0)
MCH: 26.8 pg (ref 26.0–34.0)
MCHC: 29.8 g/dL — ABNORMAL LOW (ref 30.0–36.0)
MCV: 89.7 fL (ref 78.0–100.0)
Platelets: 266 10*3/uL (ref 150–400)
RBC: 3.51 MIL/uL — AB (ref 3.87–5.11)
RDW: 14.8 % (ref 11.5–15.5)
WBC: 11.1 10*3/uL — AB (ref 4.0–10.5)

## 2016-07-12 LAB — BASIC METABOLIC PANEL
Anion gap: 8 (ref 5–15)
BUN: 19 mg/dL (ref 6–20)
CHLORIDE: 102 mmol/L (ref 101–111)
CO2: 28 mmol/L (ref 22–32)
Calcium: 8.7 mg/dL — ABNORMAL LOW (ref 8.9–10.3)
Creatinine, Ser: 0.65 mg/dL (ref 0.44–1.00)
GFR calc non Af Amer: >60 mL/min (ref >60)
Glucose, Bld: 171 mg/dL — ABNORMAL HIGH (ref 65–99)
POTASSIUM: 4.3 mmol/L (ref 3.5–5.1)
Sodium: 138 mmol/L (ref 135–145)

## 2016-07-12 LAB — TROPONIN I: TROPONIN I: 1.11 ng/mL — AB (ref <0.03)

## 2016-07-12 LAB — I-STAT CG4 LACTIC ACID, ED: Lactic Acid, Venous: 0.93 mmol/L (ref 0.5–1.9)

## 2016-07-12 MED ORDER — LEVALBUTEROL HCL 0.63 MG/3ML IN NEBU
0.6300 mg | INHALATION_SOLUTION | Freq: Once | RESPIRATORY_TRACT | Status: AC
  Administered 2016-07-12: 0.63 mg via RESPIRATORY_TRACT
  Filled 2016-07-12: qty 3

## 2016-07-12 MED ORDER — DEXTROSE 5 % IV SOLN
500.0000 mg | Freq: Once | INTRAVENOUS | Status: AC
  Administered 2016-07-13: 500 mg via INTRAVENOUS

## 2016-07-12 MED ORDER — DEXTROSE 5 % IV SOLN
1.0000 g | Freq: Once | INTRAVENOUS | Status: AC
  Administered 2016-07-13: 1 g via INTRAVENOUS
  Filled 2016-07-12: qty 10

## 2016-07-12 MED ORDER — AZITHROMYCIN 500 MG IV SOLR
INTRAVENOUS | Status: AC
  Filled 2016-07-12: qty 500

## 2016-07-12 NOTE — ED Triage Notes (Signed)
Chest pain last week. Sob today. She is pale at triage. Labored breathing. Son states he thinks she forgot to take her medications this am.

## 2016-07-12 NOTE — Telephone Encounter (Signed)
Phone consultation request by [facility] given elevated troponin.  Patient presenting with shortness of breath found to have pneumonia.  No chest pain recently.  1st Troponin 1.  Patient to be admitted to medicine.  I am not able to personally examine patient, but given what I've seen in the chart-- its reasonable to admit to medicine, treat pneumonia, obtain serial troponin numbers.  Reassess strategy pending clinical changes as needed.

## 2016-07-12 NOTE — ED Provider Notes (Signed)
[facility] DEPT [facility] Provider Note   CSN: [id number] Arrival date & time: 07/12/16  2150   By signing my name below, I, Meredith Gonzalez, attest that this documentation has been prepared under the direction and in the presence of Meredith Decamp, PA-C. Electronically Signed: [name], Scribe. 07/12/2016. 10:39 PM.   History   Chief Complaint Chief Complaint  Patient presents with  . Shortness of Breath    The history is provided by the patient and a relative. No language interpreter was used.   HPI Comments:  Meredith Gonzalez is a 81 y.o. female with a history of CAD, DM, CVA, who presents to the Emergency Department complaining of constant SOB x ~3 hours. Son states the pt called him and asked him to take her to the hospital. When he arrived to her she was lethargic and having trouble speaking in full sentences. He  notes associated wheezing. Son also noticed she has missed a few doses of her daily meds this week. Pt denies CP, nausea, vomiting, cough, and abdominal pain. She denies h/o asthma and use of home O2. Pt is a former smoker; quit about 50 years ago. No recent hospitalizations.   Cardiologist- GreggTaylor  Past Medical History:  Diagnosis Date  . Anemia   . Arthritis   . Cancer ([place])    basal cell skin cancers  . Constipation   . Coronary artery disease involving coronary bypass graft of native heart   . Diabetes mellitus without complication ([place])   . Fibromyalgia   . Fracture of right humerus   . Generalized weakness   . Headache(784.0)   . Hypothyroidism   . Incontinence of urine   . Major depression, chronic   . Neuropathy   . Pacemaker   . Recurrent falls   . Stroke ([place]) 2000  . Syncope and collapse     Patient Active Problem List   Diagnosis Date Noted  . Pacemaker 05/17/2014  . Complications, pacemaker cardiac, mechanical 12/31/2012  . Edema extremities 10/08/2012  . Atrial fibrillation ([place]) 10/08/2012  . Hypertension 10/08/2012  .  Hypothyroidism 10/08/2012  . Neuropathy 10/08/2012  . Left tibial fracture 10/08/2012  . Diabetes ([place]) 10/08/2012  . CAD (coronary artery disease) of artery bypass graft 10/08/2012  . Unspecified hypothyroidism 09/15/2012  . Depression 09/08/2012  . Pain 09/08/2012  . Subdural hematoma ([place]) 08/08/2012  . Hypokalemia 08/08/2012  . Anemia 08/08/2012  . Knee fracture, left 08/07/2012  . Right wrist fracture 08/07/2012  . Recurrent falls 08/07/2012  . Syncope 08/07/2012  . SDH (subdural hematoma) ([place]) 08/07/2012    Past Surgical History:  Procedure Laterality Date  . ABDOMINAL HYSTERECTOMY    . basal cell cancer removal    . CARPAL TUNNEL RELEASE     bilateral  . CHOLECYSTECTOMY    . CHOLECYSTECTOMY    . COLONOSCOPY    . EYE SURGERY     right eye catarace/lens implant  . FEMUR FRACTURE SURGERY     bilateral  . GASTRIC BYPASS    . LEAD REVISION N/A 01/01/2013   Procedure: LEAD REVISION;  Surgeon: Evans Lance, MD;  Location: [facility] CATH LAB;  Service: Cardiovascular;  Laterality: N/A;  . PACEMAKER INSERTION    . right ankle surgery      OB History    No data available       Home Medications    Prior to Admission medications   Medication Sig Start Date End Date Taking? Authorizing Provider  acetaminophen (TYLENOL) 500 MG  tablet Take 500 mg by mouth daily.     Historical Provider, MD  amitriptyline (ELAVIL) 25 MG tablet Take 25 mg by mouth at bedtime.    Historical Provider, MD  aspirin EC 81 MG tablet Take 1 tablet (81 mg total) by mouth daily. 03/29/13   Fay Records, MD  Calcium Citrate-Vitamin D3 315-250 MG-UNIT TABS Take 1 tablet by mouth 2 (two) times daily.    Historical Provider, MD  diphenhydrAMINE (BENADRYL ALLERGY) 25 MG tablet Take 25 mg by mouth every 8 (eight) hours as needed.    Historical Provider, MD  FLUoxetine (PROZAC) 20 MG capsule Take 1 capsule by mouth 3 (three) times daily. 09/12/15   Historical Provider, MD  gabapentin (NEURONTIN) 300 MG capsule  Take 900 mg by mouth 2 (two) times daily. Give 2 tablets (600 mg) for pain    Historical Provider, MD  Multiple Vitamins-Minerals (MULTIVITAMIN ADULT) TABS Take 1 tablet by mouth daily.    Historical Provider, MD  pindolol (VISKEN) 5 MG tablet Take 1 tablet (5 mg total) by mouth 2 (two) times daily. 03/01/13   Fay Records, MD    Family History Family History  Problem Relation Age of Onset  . Congestive Heart Failure Mother   . Heart attack Father   . Alcoholism Father   . Alcoholism Sister   . Alcoholism Brother   . Alcoholism Brother   . Throat cancer Brother     Social History Social History  Substance Use Topics  . Smoking status: Former Smoker    Packs/day: 0.50    Years: 4.00    Types: Cigarettes    Quit date: 08/07/1972  . Smokeless tobacco: Never Used  . Alcohol use Yes     Comment: wine cooler once or twice per year     Allergies   Valium [diazepam]; Darifenacin; Darvon [propoxyphene hcl]; Daypro [oxaprozin]; Enablex [darifenacin hydrobromide er]; Propoxyphene; Risperdal [risperidone]; Talwin [pentazocine]; Vicodin [hydrocodone-acetaminophen]; Codeine; Diphenhydramine-acetaminophen; and Oxycodone   Review of Systems Review of Systems  All other systems reviewed and are negative.  Physical Exam Updated Vital Signs BP (!) 156/84   Pulse 90   Temp 98.6 F (37 C) (Oral)   Resp (!) 30   SpO2 94%   Physical Exam  Constitutional: She is oriented to person, place, and time. She appears well-developed and well-nourished. No distress.  HENT:  Head: Normocephalic and atraumatic.  Right Ear: Tympanic membrane, external ear and ear canal normal.  Left Ear: Tympanic membrane, external ear and ear canal normal.  Nose: Nose normal.  Mouth/Throat: Uvula is midline, oropharynx is clear and moist and mucous membranes are normal. No trismus in the jaw. No oropharyngeal exudate, posterior oropharyngeal erythema or tonsillar abscesses.  Eyes: Conjunctivae and EOM are normal.  Pupils are equal, round, and reactive to light.  Neck: Normal range of motion. Neck supple. No tracheal deviation present.  Cardiovascular: Normal rate, regular rhythm, S1 normal, S2 normal, normal heart sounds, intact distal pulses and normal pulses.   Pulmonary/Chest: Effort normal and breath sounds normal. No respiratory distress. She has no decreased breath sounds. She has no wheezes. She has no rhonchi. She has no rales.  Decreased breath sounds throughout   Abdominal: Normal appearance and bowel sounds are normal. She exhibits no distension. There is no tenderness.  Musculoskeletal: Normal range of motion.  Neurological: She is alert and oriented to person, place, and time.  Skin: Skin is warm and dry.  Psychiatric: She has a normal mood and affect.  Her speech is normal and behavior is normal. Thought content normal.  Nursing note and vitals reviewed.  ED Treatments / Results  DIAGNOSTIC STUDIES:  Oxygen Saturation is 94% on RA, adequate by my interpretation.    COORDINATION OF CARE:  10:17 PM Discussed treatment plan with pt at bedside and pt agreed to plan.  Labs (all labs ordered are listed, but only abnormal results are displayed) Labs Reviewed  CBC - Abnormal; Notable for the following:       Result Value   WBC 11.1 (*)    RBC 3.51 (*)    Hemoglobin 9.4 (*)    HCT 31.5 (*)    MCHC 29.8 (*)    All other components within normal limits  BASIC METABOLIC PANEL - Abnormal; Notable for the following:    Glucose, Bld 171 (*)    Calcium 8.7 (*)    All other components within normal limits  TROPONIN I - Abnormal; Notable for the following:    Troponin I 1.11 (*)    All other components within normal limits  CULTURE, BLOOD (ROUTINE X 2)  CULTURE, BLOOD (ROUTINE X 2)  I-STAT CG4 LACTIC ACID, ED    EKG  EKG Interpretation  Date/Time:  Friday July 12 2016 22:00:01 EDT Ventricular Rate:  83 PR Interval:  170 QRS Duration: 112 QT Interval:  398 QTC Calculation: 467 R  Axis:   -58 Text Interpretation:  Normal sinus rhythm Incomplete right bundle branch block Left anterior fascicular block Left ventricular hypertrophy with repolarization abnormality Lateral infarct , age undetermined Abnormal ECG Interpretation limited secondary to artifact Poor R wave progression Confirmed by LITTLE MD, RACHEL [phone]) on 07/12/2016 11:21:07 PM       Radiology Dg Chest 2 View  Result Date: 07/12/2016 CLINICAL DATA:  Respiratory difficulty. Shortness of breath. Previous smoker. History of pneumonia and bronchitis. EXAM: CHEST  2 VIEW COMPARISON:  05/01/2015 FINDINGS: Cardiac pacemaker. Shallow inspiration. Perihilar airspace and interstitial changes bilaterally. This is new since previous study. Peribronchial thickening. Changes likely to represent multifocal pneumonia. Normal heart size. No blunting of costophrenic angles. No pneumothorax. Calcified and tortuous aorta. Old fracture deformity of the right proximal humerus. Inferior vena caval filter. IMPRESSION: Bronchial wall thickening with perihilar interstitial and airspace disease new since previous study. Changes likely to represent multifocal pneumonia. Electronically Signed   By: Lucienne Capers M.D.   On: 07/12/2016 22:55    Procedures Procedures (including critical care time)  Medications Ordered in ED Medications - No data to display   Initial Impression / Assessment and Plan / ED Course  I have reviewed the triage vital signs and the nursing notes.  Pertinent labs & imaging results that were available during my care of the patient were reviewed by me and considered in my medical decision making (see chart for details).  {I have reviewed and evaluated the relevant laboratory values. {I have reviewed and evaluated the relevant imaging studies. {I have interpreted the relevant EKG. {I have reviewed the relevant previous healthcare records.  {I obtained HPI from historian. {Patient discussed with supervising  physician.  ED Course:  Assessment: Pt is a 81 y.o. female with hx CAD, DM, CVA who presents with shortness of breath PTA. No active CP/ABD pain. No N/V. No diaphoresis. On exam, pt in NAD. Nontoxic/nonseptic appearing. VSS. Afebrile. Noted mild WOB with O2 sat 94%. Given Xopnenex with mild improvement. Lungs with decreased breath sounds bilaterally. Heart RRR. Abdomen nontender soft. CBC with leukocytosis. BMP unremarkable. CXR with multifocal  pneumonia. iStat Lactic 0.93. Given Azithro/Rocephin in ED. Blood cultures drawn prior. Trop elevated 1.11. No active CP. Consult Cardiology likely due to infection. Serial Trop.  Plan is to admit to medicine.    Disposition/Plan:  Admit Pt acknowledges and agrees with plan  Supervising Physician Sharlett Iles, MD  Final Clinical Impressions(s) / ED Diagnoses   Final diagnoses:  Multifocal pneumonia  Elevated troponin  Leukocytosis, unspecified type    New Prescriptions New Prescriptions   No medications on file    I personally performed the services described in this documentation, which was scribed in my presence. The recorded information has been reviewed and is accurate.     Meredith Decamp, PA-C 07/13/16 [place], MD 07/13/16 [phone]

## 2016-07-13 ENCOUNTER — Encounter (HOSPITAL_COMMUNITY): Payer: Self-pay | Admitting: *Deleted

## 2016-07-13 DIAGNOSIS — R0600 Dyspnea, unspecified: Secondary | ICD-10-CM | POA: Diagnosis not present

## 2016-07-13 DIAGNOSIS — F329 Major depressive disorder, single episode, unspecified: Secondary | ICD-10-CM

## 2016-07-13 DIAGNOSIS — I200 Unstable angina: Secondary | ICD-10-CM | POA: Diagnosis not present

## 2016-07-13 DIAGNOSIS — M797 Fibromyalgia: Secondary | ICD-10-CM | POA: Diagnosis present

## 2016-07-13 DIAGNOSIS — D72829 Elevated white blood cell count, unspecified: Secondary | ICD-10-CM | POA: Diagnosis not present

## 2016-07-13 DIAGNOSIS — I10 Essential (primary) hypertension: Secondary | ICD-10-CM

## 2016-07-13 DIAGNOSIS — D519 Vitamin B12 deficiency anemia, unspecified: Secondary | ICD-10-CM | POA: Diagnosis not present

## 2016-07-13 DIAGNOSIS — Z9884 Bariatric surgery status: Secondary | ICD-10-CM | POA: Diagnosis not present

## 2016-07-13 DIAGNOSIS — I25810 Atherosclerosis of coronary artery bypass graft(s) without angina pectoris: Secondary | ICD-10-CM | POA: Diagnosis not present

## 2016-07-13 DIAGNOSIS — I214 Non-ST elevation (NSTEMI) myocardial infarction: Secondary | ICD-10-CM | POA: Diagnosis present

## 2016-07-13 DIAGNOSIS — R079 Chest pain, unspecified: Secondary | ICD-10-CM | POA: Diagnosis not present

## 2016-07-13 DIAGNOSIS — D649 Anemia, unspecified: Secondary | ICD-10-CM | POA: Diagnosis present

## 2016-07-13 DIAGNOSIS — I340 Nonrheumatic mitral (valve) insufficiency: Secondary | ICD-10-CM | POA: Diagnosis not present

## 2016-07-13 DIAGNOSIS — I480 Paroxysmal atrial fibrillation: Secondary | ICD-10-CM | POA: Diagnosis present

## 2016-07-13 DIAGNOSIS — J189 Pneumonia, unspecified organism: Secondary | ICD-10-CM | POA: Diagnosis present

## 2016-07-13 DIAGNOSIS — I272 Pulmonary hypertension, unspecified: Secondary | ICD-10-CM | POA: Diagnosis present

## 2016-07-13 DIAGNOSIS — I5031 Acute diastolic (congestive) heart failure: Secondary | ICD-10-CM | POA: Diagnosis not present

## 2016-07-13 DIAGNOSIS — E039 Hypothyroidism, unspecified: Secondary | ICD-10-CM | POA: Diagnosis present

## 2016-07-13 DIAGNOSIS — R748 Abnormal levels of other serum enzymes: Secondary | ICD-10-CM | POA: Diagnosis not present

## 2016-07-13 DIAGNOSIS — I2510 Atherosclerotic heart disease of native coronary artery without angina pectoris: Secondary | ICD-10-CM | POA: Diagnosis present

## 2016-07-13 DIAGNOSIS — Z79899 Other long term (current) drug therapy: Secondary | ICD-10-CM | POA: Diagnosis not present

## 2016-07-13 DIAGNOSIS — Z85828 Personal history of other malignant neoplasm of skin: Secondary | ICD-10-CM | POA: Diagnosis not present

## 2016-07-13 DIAGNOSIS — E119 Type 2 diabetes mellitus without complications: Secondary | ICD-10-CM | POA: Diagnosis present

## 2016-07-13 DIAGNOSIS — Z951 Presence of aortocoronary bypass graft: Secondary | ICD-10-CM | POA: Diagnosis not present

## 2016-07-13 DIAGNOSIS — Z9049 Acquired absence of other specified parts of digestive tract: Secondary | ICD-10-CM | POA: Diagnosis not present

## 2016-07-13 DIAGNOSIS — Z955 Presence of coronary angioplasty implant and graft: Secondary | ICD-10-CM | POA: Diagnosis not present

## 2016-07-13 DIAGNOSIS — Z950 Presence of cardiac pacemaker: Secondary | ICD-10-CM | POA: Diagnosis not present

## 2016-07-13 DIAGNOSIS — I110 Hypertensive heart disease with heart failure: Secondary | ICD-10-CM | POA: Diagnosis present

## 2016-07-13 DIAGNOSIS — R7989 Other specified abnormal findings of blood chemistry: Secondary | ICD-10-CM | POA: Diagnosis present

## 2016-07-13 DIAGNOSIS — Z8673 Personal history of transient ischemic attack (TIA), and cerebral infarction without residual deficits: Secondary | ICD-10-CM | POA: Diagnosis not present

## 2016-07-13 DIAGNOSIS — R0602 Shortness of breath: Secondary | ICD-10-CM | POA: Diagnosis present

## 2016-07-13 DIAGNOSIS — Z7982 Long term (current) use of aspirin: Secondary | ICD-10-CM | POA: Diagnosis not present

## 2016-07-13 DIAGNOSIS — R296 Repeated falls: Secondary | ICD-10-CM | POA: Diagnosis present

## 2016-07-13 DIAGNOSIS — E538 Deficiency of other specified B group vitamins: Secondary | ICD-10-CM | POA: Diagnosis present

## 2016-07-13 DIAGNOSIS — R55 Syncope and collapse: Secondary | ICD-10-CM | POA: Diagnosis not present

## 2016-07-13 DIAGNOSIS — I5043 Acute on chronic combined systolic (congestive) and diastolic (congestive) heart failure: Secondary | ICD-10-CM | POA: Diagnosis present

## 2016-07-13 DIAGNOSIS — R778 Other specified abnormalities of plasma proteins: Secondary | ICD-10-CM | POA: Diagnosis present

## 2016-07-13 DIAGNOSIS — J9601 Acute respiratory failure with hypoxia: Secondary | ICD-10-CM | POA: Diagnosis present

## 2016-07-13 DIAGNOSIS — Z87891 Personal history of nicotine dependence: Secondary | ICD-10-CM | POA: Diagnosis not present

## 2016-07-13 LAB — TROPONIN I
TROPONIN I: 0.93 ng/mL — AB (ref <0.03)
TROPONIN I: 0.86 ng/mL — AB (ref <0.03)
Troponin I: 1.13 ng/mL (ref <0.03)

## 2016-07-13 LAB — STREP PNEUMONIAE URINARY ANTIGEN: Strep Pneumo Urinary Antigen: NEGATIVE

## 2016-07-13 LAB — GLUCOSE, CAPILLARY
GLUCOSE-CAPILLARY: 119 mg/dL — AB (ref 65–99)
GLUCOSE-CAPILLARY: 128 mg/dL — AB (ref 65–99)
GLUCOSE-CAPILLARY: 122 mg/dL — AB (ref 65–99)
GLUCOSE-CAPILLARY: 168 mg/dL — AB (ref 65–99)
Glucose-Capillary: 200 mg/dL — ABNORMAL HIGH (ref 65–99)

## 2016-07-13 MED ORDER — LORAZEPAM 0.5 MG PO TABS
0.5000 mg | ORAL_TABLET | Freq: Four times a day (QID) | ORAL | Status: DC | PRN
  Administered 2016-07-13: 0.5 mg via ORAL
  Filled 2016-07-13: qty 1

## 2016-07-13 MED ORDER — ACETAMINOPHEN 500 MG PO TABS
500.0000 mg | ORAL_TABLET | Freq: Every day | ORAL | Status: DC
  Administered 2016-07-13 – 2016-07-20 (×5): 500 mg via ORAL
  Filled 2016-07-13 (×6): qty 1

## 2016-07-13 MED ORDER — FLUOXETINE HCL 20 MG PO CAPS
20.0000 mg | ORAL_CAPSULE | Freq: Three times a day (TID) | ORAL | Status: DC
  Administered 2016-07-13 – 2016-07-14 (×6): 20 mg via ORAL
  Filled 2016-07-13 (×6): qty 1

## 2016-07-13 MED ORDER — CALCIUM CARBONATE-VITAMIN D 500-200 MG-UNIT PO TABS: 1.0000 | ORAL_TABLET | Freq: Two times a day (BID) | ORAL | Status: DC

## 2016-07-13 MED ORDER — AMITRIPTYLINE HCL 25 MG PO TABS
25.0000 mg | ORAL_TABLET | Freq: Every day | ORAL | Status: DC
  Administered 2016-07-13 – 2016-07-19 (×7): 25 mg via ORAL
  Filled 2016-07-13 (×8): qty 1

## 2016-07-13 MED ORDER — CALCIUM CITRATE-VITAMIN D3 315-250 MG-UNIT PO TABS: 1.0000 | ORAL_TABLET | Freq: Two times a day (BID) | ORAL | Status: DC

## 2016-07-13 MED ORDER — PINDOLOL 5 MG PO TABS
5.0000 mg | ORAL_TABLET | Freq: Two times a day (BID) | ORAL | Status: DC
  Administered 2016-07-13 – 2016-07-20 (×15): 5 mg via ORAL
  Filled 2016-07-13 (×16): qty 1

## 2016-07-13 MED ORDER — CEFTRIAXONE SODIUM 1 G IJ SOLR
1.0000 g | INTRAMUSCULAR | Status: AC
Start: 2016-07-13 — End: 2016-07-19
  Administered 2016-07-13 – 2016-07-18 (×6): 1 g via INTRAVENOUS
  Filled 2016-07-13 (×6): qty 10

## 2016-07-13 MED ORDER — SODIUM CHLORIDE 0.9% FLUSH
3.0000 mL | Freq: Two times a day (BID) | INTRAVENOUS | Status: DC
  Administered 2016-07-13 – 2016-07-18 (×11): 3 mL via INTRAVENOUS

## 2016-07-13 MED ORDER — ENOXAPARIN SODIUM 40 MG/0.4ML ~~LOC~~ SOLN
40.0000 mg | SUBCUTANEOUS | Status: DC
  Administered 2016-07-13 – 2016-07-18 (×5): 40 mg via SUBCUTANEOUS
  Filled 2016-07-13 (×6): qty 0.4

## 2016-07-13 MED ORDER — DEXTROSE 5 % IV SOLN
500.0000 mg | INTRAVENOUS | Status: DC
  Administered 2016-07-13: 500 mg via INTRAVENOUS
  Filled 2016-07-13: qty 500

## 2016-07-13 MED ORDER — SODIUM CHLORIDE 0.9% FLUSH: 3.0000 mL | INTRAVENOUS | Status: DC | PRN

## 2016-07-13 MED ORDER — DIPHENHYDRAMINE HCL 25 MG PO CAPS
25.0000 mg | ORAL_CAPSULE | Freq: Three times a day (TID) | ORAL | Status: DC | PRN
  Filled 2016-07-13: qty 1

## 2016-07-13 MED ORDER — LEVALBUTEROL HCL 0.63 MG/3ML IN NEBU
0.6300 mg | INHALATION_SOLUTION | RESPIRATORY_TRACT | Status: DC | PRN
  Administered 2016-07-13: 0.63 mg via RESPIRATORY_TRACT
  Filled 2016-07-13 (×2): qty 3

## 2016-07-13 MED ORDER — ACETAMINOPHEN 325 MG PO TABS
650.0000 mg | ORAL_TABLET | Freq: Once | ORAL | Status: AC
  Administered 2016-07-13: 650 mg via ORAL

## 2016-07-13 MED ORDER — ADULT MULTIVITAMIN W/MINERALS CH
1.0000 | ORAL_TABLET | Freq: Every day | ORAL | Status: DC
  Administered 2016-07-13 – 2016-07-20 (×7): 1 via ORAL
  Filled 2016-07-13 (×7): qty 1

## 2016-07-13 MED ORDER — GABAPENTIN 300 MG PO CAPS
900.0000 mg | ORAL_CAPSULE | Freq: Two times a day (BID) | ORAL | Status: DC
  Administered 2016-07-13 (×2): 900 mg via ORAL
  Administered 2016-07-14: 300 mg via ORAL
  Filled 2016-07-13 (×3): qty 3

## 2016-07-13 MED ORDER — CALCIUM CARBONATE-VITAMIN D 500-200 MG-UNIT PO TABS
1.0000 | ORAL_TABLET | Freq: Two times a day (BID) | ORAL | Status: DC
  Administered 2016-07-13 – 2016-07-20 (×12): 1 via ORAL
  Filled 2016-07-13 (×12): qty 1

## 2016-07-13 MED ORDER — SODIUM CHLORIDE 0.9 % IV SOLN: 250.0000 mL | INTRAVENOUS | Status: DC | PRN

## 2016-07-13 MED ORDER — ASPIRIN EC 81 MG PO TBEC
81.0000 mg | DELAYED_RELEASE_TABLET | Freq: Every day | ORAL | Status: DC
  Administered 2016-07-13 – 2016-07-20 (×8): 81 mg via ORAL
  Filled 2016-07-13 (×8): qty 1

## 2016-07-13 MED ORDER — ACETAMINOPHEN 325 MG PO TABS
ORAL_TABLET | ORAL | Status: AC
  Filled 2016-07-13: qty 2

## 2016-07-13 NOTE — H&P (Signed)
History and Physical    Meredith Gonzalez VQM:[id number] DOB: 1935/06/22 DOA: 07/12/2016  PCP: Sinclair Ship, MD   Patient coming from: Home, by way of [facility]   Chief Complaint: SOB  HPI: Meredith Gonzalez is a 81 y.o. female with medical history significant for coronary artery disease status post CABG, hypertension, depression, chronic normocytic anemia, and history of CVA who presents to [place] ED for evaluation shortness of breath. Patient reported that she was having some chest pain several days ago, but was not evaluated for it and it eventually resolved. She returned to her normal state and was doing well until earlier on the day of her presentation when she developed shortness of breath. She denied any significant cough, but noted dyspnea, initially with exertion, and later even at rest. There were no fevers or chills and she denied any associated chest pain or palpitations. She denies any lower extremity edema or orthopnea. There has not been any abdominal pain, nausea, vomiting, or diarrhea. She denies melena or hematochezia.  [facility] ED Course: Upon arrival to the [facility] ED, patient is found to be afebrile, saturating low 90s on room air, tachypneic in the 30s, and with vitals otherwise stable. EKG features a normal sinus rhythm with right bundle-branch block, LAFB, and LVH with secondary repolarization abnormality. Chest x-ray is notable for bronchial wall thickening and perihilar interstitial and airspace disease which is new and likely representing multifocal pneumonia. Chemistry panel is unremarkable and CBC is notable for a leukocytosis to 11,100 and a stable normocytic anemia with hemoglobin of 9.4. Lactic acid is reassuring at 0.93. Troponin is elevated to a value of 1.11. Blood cultures were obtained, patient was treated with acetaminophen, Xopenex, and started on empiric Rocephin and azithromycin. Cardiology was consulted by the ED physician and advised for  medical admission, explaining that the troponin elevation in the absence of chest pain could be secondary to the ammonia. She is remained hemodynamically stable and in no acute distress and will be admitted to the telemetry unit for ongoing evaluation and management of community-acquired pneumonia with an elevated troponin.  Review of Systems:  All other systems reviewed and apart from HPI, are negative.  Past Medical History:  Diagnosis Date  . Anemia   . Arthritis   . Cancer ([place])    basal cell skin cancers  . Constipation   . Coronary artery disease involving coronary bypass graft of native heart   . Diabetes mellitus without complication ([place])   . Fibromyalgia   . Fracture of right humerus   . Generalized weakness   . Headache(784.0)   . Hypothyroidism   . Incontinence of urine   . Major depression, chronic   . Neuropathy   . Pacemaker   . Recurrent falls   . Stroke ([place]) 2000  . Syncope and collapse     Past Surgical History:  Procedure Laterality Date  . ABDOMINAL HYSTERECTOMY    . basal cell cancer removal    . CARPAL TUNNEL RELEASE     bilateral  . CHOLECYSTECTOMY    . CHOLECYSTECTOMY    . COLONOSCOPY    . EYE SURGERY     right eye catarace/lens implant  . FEMUR FRACTURE SURGERY     bilateral  . GASTRIC BYPASS    . LEAD REVISION N/A 01/01/2013   Procedure: LEAD REVISION;  Surgeon: Evans Lance, MD;  Location: [facility] CATH LAB;  Service: Cardiovascular;  Laterality: N/A;  . PACEMAKER INSERTION    .  right ankle surgery       reports that she quit smoking about 43 years ago. Her smoking use included Cigarettes. She has a 2.00 pack-year smoking history. She has never used smokeless tobacco. She reports that she drinks alcohol. She reports that she does not use drugs.  Allergies  Allergen Reactions  . Valium [Diazepam] Anxiety    Makes patient hyper Makes patient hyper  . Darifenacin Itching    Can take with Benadryl  . Darvon [Propoxyphene Hcl] Itching     Can take with Benadryl  . Daypro [Oxaprozin] Itching    Can take with Benadryl Can take with Benadryl  . Enablex [Darifenacin Hydrobromide Er] Itching    Can take with Benadryl  . Propoxyphene Itching    Can take with Benadryl  . Risperdal [Risperidone] Itching    Can take with Benadryl Can take with Benadryl  . Talwin [Pentazocine] Itching    Can take with Benadryl Can take with Benadryl  . Vicodin [Hydrocodone-Acetaminophen] Itching    Can take with Benadryl Can take with Benadryl  . Codeine Hives and Itching    Can take with Benadryl Can take with Benadryl  . Diphenhydramine-Acetaminophen Rash    If taken with benadryl no reaction  . Oxycodone Itching    Can take with Benadryl Can take with Benadryl Can take with Benadryl Can take with Benadryl    Family History  Problem Relation Age of Onset  . Congestive Heart Failure Mother   . Heart attack Father   . Alcoholism Father   . Alcoholism Sister   . Alcoholism Brother   . Alcoholism Brother   . Throat cancer Brother      Prior to Admission medications   Medication Sig Start Date End Date Taking? Authorizing Provider  acetaminophen (TYLENOL) 500 MG tablet Take 500 mg by mouth daily.     Historical Provider, MD  amitriptyline (ELAVIL) 25 MG tablet Take 25 mg by mouth at bedtime.    Historical Provider, MD  aspirin EC 81 MG tablet Take 1 tablet (81 mg total) by mouth daily. 03/29/13   Fay Records, MD  Calcium Citrate-Vitamin D3 315-250 MG-UNIT TABS Take 1 tablet by mouth 2 (two) times daily.    Historical Provider, MD  diphenhydrAMINE (BENADRYL ALLERGY) 25 MG tablet Take 25 mg by mouth every 8 (eight) hours as needed.    Historical Provider, MD  FLUoxetine (PROZAC) 20 MG capsule Take 1 capsule by mouth 3 (three) times daily. 09/12/15   Historical Provider, MD  gabapentin (NEURONTIN) 300 MG capsule Take 900 mg by mouth 2 (two) times daily. Give 2 tablets (600 mg) for pain    Historical Provider, MD  Multiple  Vitamins-Minerals (MULTIVITAMIN ADULT) TABS Take 1 tablet by mouth daily.    Historical Provider, MD  pindolol (VISKEN) 5 MG tablet Take 1 tablet (5 mg total) by mouth 2 (two) times daily. 03/01/13   Fay Records, MD    Physical Exam: Vitals:   07/13/16 0130 07/13/16 0145 07/13/16 0200 07/13/16 0319  BP: (!) 157/87  130/73 126/65  Pulse: 77 73 79 70  Resp: 19 (!) 21 20 20   Temp:    98.2 F (36.8 C)  TempSrc:    Oral  SpO2: 98% 100% 99% 99%  Weight:    55.8 kg (123 lb 1.6 oz)  Height:    5\' 6"  (1.676 m)      Constitutional: NAD, calm, comfortable Eyes: PERTLA, lids and conjunctivae normal ENMT: Mucous membranes are  moist. Posterior pharynx clear of any exudate or lesions.   Neck: normal, supple, no masses, no thyromegaly Respiratory: Coarse rales bilaterally, no wheezing, no crackles. Normal respiratory effort.    Cardiovascular: S1 & S2 heard, regular rate and rhythm. No extremity edema. No significant JVD. Abdomen: No distension, no tenderness, no masses palpated. Bowel sounds normal.  Musculoskeletal: no clubbing / cyanosis. No joint deformity upper and lower extremities.   Skin: Left preauricular crusted lesion. Warm, dry, well-perfused. Neurologic: CN 2-12 grossly intact. Sensation intact, DTR normal. Strength 5/5 in all 4 limbs.  Psychiatric: Alert and oriented x 3. Calm and cooperative.     Labs on Admission: I have personally reviewed following labs and imaging studies  CBC:  Recent Labs Lab 07/12/16 2235  WBC 11.1*  HGB 9.4*  HCT 31.5*  MCV 89.7  PLT 546   Basic Metabolic Panel:  Recent Labs Lab 07/12/16 2235  NA 138  K 4.3  CL 102  CO2 28  GLUCOSE 171*  BUN 19  CREATININE 0.65  CALCIUM 8.7*   GFR: Estimated Creatinine Clearance: 51.1 mL/min (by C-G formula based on SCr of 0.65 mg/dL). Liver Function Tests: No results for input(s): AST, ALT, ALKPHOS, BILITOT, PROT, ALBUMIN in the last 168 hours. No results for input(s): LIPASE, AMYLASE in the  last 168 hours. No results for input(s): AMMONIA in the last 168 hours. Coagulation Profile: No results for input(s): INR, PROTIME in the last 168 hours. Cardiac Enzymes:  Recent Labs Lab 07/12/16 2235  TROPONINI 1.11*   BNP (last 3 results) No results for input(s): PROBNP in the last 8760 hours. HbA1C: No results for input(s): HGBA1C in the last 72 hours. CBG:  Recent Labs Lab 07/13/16 0313  GLUCAP 200*   Lipid Profile: No results for input(s): CHOL, HDL, LDLCALC, TRIG, CHOLHDL, LDLDIRECT in the last 72 hours. Thyroid Function Tests: No results for input(s): TSH, T4TOTAL, FREET4, T3FREE, THYROIDAB in the last 72 hours. Anemia Panel: No results for input(s): VITAMINB12, FOLATE, FERRITIN, TIBC, IRON, RETICCTPCT in the last 72 hours. Urine analysis: No results found for: COLORURINE, APPEARANCEUR, LABSPEC, PHURINE, GLUCOSEU, HGBUR, BILIRUBINUR, KETONESUR, PROTEINUR, UROBILINOGEN, NITRITE, LEUKOCYTESUR Sepsis Labs: @LABRCNTIP (procalcitonin:4,lacticidven:4) )No results found for this or any previous visit (from the past 240 hour(s)).   Radiological Exams on Admission: Dg Chest 2 View  Result Date: 07/12/2016 CLINICAL DATA:  Respiratory difficulty. Shortness of breath. Previous smoker. History of pneumonia and bronchitis. EXAM: CHEST  2 VIEW COMPARISON:  05/01/2015 FINDINGS: Cardiac pacemaker. Shallow inspiration. Perihilar airspace and interstitial changes bilaterally. This is new since previous study. Peribronchial thickening. Changes likely to represent multifocal pneumonia. Normal heart size. No blunting of costophrenic angles. No pneumothorax. Calcified and tortuous aorta. Old fracture deformity of the right proximal humerus. Inferior vena caval filter. IMPRESSION: Bronchial wall thickening with perihilar interstitial and airspace disease new since previous study. Changes likely to represent multifocal pneumonia. Electronically Signed   By: Lucienne Capers M.D.   On: 07/12/2016  22:55    EKG: Independently reviewed. Sinus rhythm, RBBB, LAFB, LVH with secondary repolarization abnormality.  Assessment/Plan  1. CAP  - Pt presented with SOB, found to be tachypneic with rhonchi and CXR suggestive of multifocal PNA  - Blood cultures have been collected; check sputum culture and strep pneumo antigen  - Continue empiric Rocephin and azithromycin - Supportive care with supplemental O2, prn bronchodilator, prn APAP    2. CAD, elevated troponin - There is no anginal complaint  - EKG with sinus rhythm, RBBB, LAFB, and  LVH with repol abnormality  - Initial troponin is 1.11; this was discussed with cardiology and without any anginal complaint, felt secondary to the PNA and advised to trend troponin  - Monitor on telemetry, trend troponin - Continue ASA 81, beta-blocker   3. Hypertension  - BP at goal, continue pindolol    4. Depression  - Appears to be stable  - Continue Elavil and Prozac    5. Normocytic anemia  - Hgb is 9.4 on admission  - This is stable relative to priors and there is no bleeding     DVT prophylaxis: sq Lovenox Code Status: Full  Family Communication: Discussed with patient Disposition Plan: Admit to telemetry Consults called: Cardiology Admission status: Inpatient    Vianne Bulls, MD [facility] Pager [phone]  If 7PM-7AM, please contact night-coverage [email or web address] Password [email or web address]  07/13/2016, 4:07 AM

## 2016-07-13 NOTE — ED Notes (Signed)
Patient is resting comfortably. 

## 2016-07-13 NOTE — ED Notes (Signed)
Family at bedside. 

## 2016-07-13 NOTE — Progress Notes (Signed)
Transfer from Meredith Gonzalez - [facility] 81 y/o female with past medical history of CAD, DM, CVA, s/p PM, and remote H/o tobacco abuse; who presents with complaints of shortness of breath. VS: T: 98.34F, pulse 83-90, R 25-30, O2 saturations 94-98% on 2 L of nasal cannula oxygen. Labs: WBC 11.1,Hbg 9.4, Trop 1.11, lactic acid 0.9. CXR with new airspace disease suggestive of a multifocal pneumonia. Cardiology was consulted but recommended trending troponins as symptoms could be secondary to pneumonia. Recommended consultation if troponins seen to be trending upward. Cultures were obtained patient was initially started on antibiotics of Rocephin and azithromycin. Transferring to a telemetry bed.

## 2016-07-13 NOTE — ED Notes (Signed)
Pt. Took tylenol for a slight headache

## 2016-07-13 NOTE — Progress Notes (Signed)
PROGRESS NOTE        PATIENT DETAILS Name: Meredith Gonzalez Age: 81 y.o. Sex: female Date of Birth: 11-05-1935 Admit Date: 07/12/2016 Admitting Physician Vianne Bulls, MD VOJ:[id number], Cecille Rubin, MD  Brief Narrative: Patient is a 81 y.o. female with history of CAD status post CABG, hypertension, depression, prior history of CVA with no major residual effects presented to the hospital with worsening shortness of breath, found to have multifocal pneumonia and admitted to the hospitalist service for further evaluation and treatment. Please see below for further details  Subjective: Feels better than yesterday. Thinks shortness of breath has somewhat improved.  Did have chest pain last week-but none over the past few days.  No chest pain No headache No Nause.vomiting or diarrhea  Assessment/Plan: Community acquired pneumonia: Continue empiric Rocephin/Zithromax, await culture data. She is somewhat clinically improved than compared to admission.  Elevated troponins: Suspect secondary to demand ischemia in a setting of above-however patient did have some intermittent chest pain sometime last week. EKG nonacute, continue to cycle troponins. Note, admitting M.D. discussed with cardiology-recommendations were to cycle troponins and follow. Note, no chest pain on admission are today. Check echo. On aspirin.  History of CAD status post CABG: See above regarding troponins-continue aspirin and beta blocker  History of hypertension: Controlled, continue pindolol.  Depression: Appears to be stable, continue Elavil and Prozac.  History of symptomatic bradycardia with syncope-status post permanent pacemaker implantation  DVT Prophylaxis: Prophylactic Lovenox   Code Status: Full code   Family Communication: None at bedside  Disposition Plan: Remain inpatient-home sometime early next week  Antimicrobial agents: Anti-infectives    Start     Dose/Rate Route Frequency  Ordered Stop   07/13/16 2200  cefTRIAXone (ROCEPHIN) 1 g in dextrose 5 % 50 mL IVPB     1 g 100 mL/hr over 30 Minutes Intravenous Every 24 hours 07/13/16 0407 07/19/16 2159   07/13/16 2100  azithromycin (ZITHROMAX) 500 mg in dextrose 5 % 250 mL IVPB     500 mg 250 mL/hr over 60 Minutes Intravenous Every 24 hours 07/13/16 0407 07/19/16 2059   07/12/16 2319  azithromycin (ZITHROMAX) 500 MG injection    Comments:  Bonney Roussel   : cabinet override      07/12/16 2319 07/13/16 1129   07/12/16 2315  cefTRIAXone (ROCEPHIN) 1 g in dextrose 5 % 50 mL IVPB     1 g 100 mL/hr over 30 Minutes Intravenous  Once 07/12/16 2300 07/13/16 0101   07/12/16 2315  azithromycin (ZITHROMAX) 500 mg in dextrose 5 % 250 mL IVPB     500 mg 250 mL/hr over 60 Minutes Intravenous  Once 07/12/16 2300 07/13/16 0204      Procedures: None  CONSULTS:  None  Time spent: 25- minutes-Greater than 50% of this time was spent in counseling, explanation of diagnosis, planning of further management, and coordination of care.  MEDICATIONS: Scheduled Meds: . acetaminophen  500 mg Oral Daily  . amitriptyline  25 mg Oral QHS  . aspirin EC  81 mg Oral Daily  . calcium-vitamin D  1 tablet Oral BID WC  . enoxaparin (LOVENOX) injection  40 mg Subcutaneous Q24H  . FLUoxetine  20 mg Oral TID  . gabapentin  900 mg Oral BID  . multivitamin with minerals  1 tablet Oral Daily  . pindolol  5 mg Oral BID  .  sodium chloride flush  3 mL Intravenous Q12H   Continuous Infusions: . sodium chloride    . azithromycin    . cefTRIAXone (ROCEPHIN)  IV     PRN Meds:.sodium chloride, diphenhydrAMINE, levalbuterol, LORazepam, sodium chloride flush   PHYSICAL EXAM: Vital signs: Vitals:   07/13/16 0200 07/13/16 0319 07/13/16 0947 07/13/16 0952  BP: 130/73 126/65 (!) 145/61   Pulse: 79 70    Resp: 20 20 20    Temp:  98.2 F (36.8 C)    TempSrc:  Oral    SpO2: 99% 99% 99% (!) 87%  Weight:  55.8 kg (123 lb 1.6 oz)    Height:  5\' 6"   (1.676 m)     Filed Weights   07/13/16 0319  Weight: 55.8 kg (123 lb 1.6 oz)   Body mass index is 19.87 kg/m.   General appearance :Awake, alert, not in any distress. Speech Clear. Not toxic Looking Eyes:, pupils equally reactive to light and accomodation,no scleral icterus. HEENT: Atraumatic and Normocephalic Neck: supple, no JVD. No cervical lymphadenopathy.  Resp:Good air entry bilaterally, no added sounds  CVS: S1 S2 regular  GI: Bowel sounds present, Non tender and not distended with no gaurding, rigidity or rebound. Extremities: B/L Lower Ext shows no edema, both legs are warm to touch Neurology:  speech clear,Non focal, sensation is grossly intact. Psychiatric: Normal judgment and insight. Alert and oriented x 3. Normal mood. Musculoskeletal:No digital cyanosis Skin:No Rash, warm and dry Wounds:N/A  I have personally reviewed following labs and imaging studies  LABORATORY DATA: CBC:  Recent Labs Lab 07/12/16 2235  WBC 11.1*  HGB 9.4*  HCT 31.5*  MCV 89.7  PLT 235    Basic Metabolic Panel:  Recent Labs Lab 07/12/16 2235  NA 138  K 4.3  CL 102  CO2 28  GLUCOSE 171*  BUN 19  CREATININE 0.65  CALCIUM 8.7*    GFR: Estimated Creatinine Clearance: 51.1 mL/min (by C-G formula based on SCr of 0.65 mg/dL).  Liver Function Tests: No results for input(s): AST, ALT, ALKPHOS, BILITOT, PROT, ALBUMIN in the last 168 hours. No results for input(s): LIPASE, AMYLASE in the last 168 hours. No results for input(s): AMMONIA in the last 168 hours.  Coagulation Profile: No results for input(s): INR, PROTIME in the last 168 hours.  Cardiac Enzymes:  Recent Labs Lab 07/12/16 2235 07/13/16 0528  TROPONINI 1.11* 1.13*    BNP (last 3 results) No results for input(s): PROBNP in the last 8760 hours.  HbA1C: No results for input(s): HGBA1C in the last 72 hours.  CBG:  Recent Labs Lab 07/13/16 0313 07/13/16 0753 07/13/16 1124  GLUCAP 200* 119* 128*     Lipid Profile: No results for input(s): CHOL, HDL, LDLCALC, TRIG, CHOLHDL, LDLDIRECT in the last 72 hours.  Thyroid Function Tests: No results for input(s): TSH, T4TOTAL, FREET4, T3FREE, THYROIDAB in the last 72 hours.  Anemia Panel: No results for input(s): VITAMINB12, FOLATE, FERRITIN, TIBC, IRON, RETICCTPCT in the last 72 hours.  Urine analysis: No results found for: COLORURINE, APPEARANCEUR, LABSPEC, PHURINE, GLUCOSEU, HGBUR, BILIRUBINUR, KETONESUR, PROTEINUR, UROBILINOGEN, NITRITE, LEUKOCYTESUR  Sepsis Labs: Lactic Acid, Venous    Component Value Date/Time   LATICACIDVEN 0.93 07/12/2016 2243    MICROBIOLOGY: No results found for this or any previous visit (from the past 240 hour(s)).  RADIOLOGY STUDIES/RESULTS: Dg Chest 2 View  Result Date: 07/12/2016 CLINICAL DATA:  Respiratory difficulty. Shortness of breath. Previous smoker. History of pneumonia and bronchitis. EXAM: CHEST  2 VIEW COMPARISON:  05/01/2015 FINDINGS: Cardiac pacemaker. Shallow inspiration. Perihilar airspace and interstitial changes bilaterally. This is new since previous study. Peribronchial thickening. Changes likely to represent multifocal pneumonia. Normal heart size. No blunting of costophrenic angles. No pneumothorax. Calcified and tortuous aorta. Old fracture deformity of the right proximal humerus. Inferior vena caval filter. IMPRESSION: Bronchial wall thickening with perihilar interstitial and airspace disease new since previous study. Changes likely to represent multifocal pneumonia. Electronically Signed   By: Lucienne Capers M.D.   On: 07/12/2016 22:55     LOS: 0 days   Oren Binet, MD  [facility] Pager:[phone]  If 7PM-7AM, please contact night-coverage [email or web address] Password [facility] 07/13/2016, 12:14 PM

## 2016-07-13 NOTE — Evaluation (Signed)
Physical Therapy Evaluation Patient Details Name: Meredith Gonzalez MRN: [id number] DOB: 13-Mar-1936 Today's Date: 07/13/2016   History of Present Illness  Pt is a 81 y/o female admitted secondary to CAP. PMH including but not limited to CAD s/p CABG, HTN, CVA, DM and pacemaker insertion.  Clinical Impression  Pt presented supine in bed with HOB elevated, awake and willing to participate in therapy session. Prior to admission, pt reported that she was mod I with functional mobility with use of rollator PRN and independent with ADLs and IADLs. Pt lives alone in a single story home with family assistance PRN. Pt currently very mobile with RW and steady with gait. Pt ambulated on RA with SPO2 decreasing to 87%. PT will continue to follow pt acutely to ensure a safe d/c home.      Follow Up Recommendations No PT follow up    Equipment Recommendations  None recommended by PT    Recommendations for Other Services       Precautions / Restrictions Precautions Precautions: Fall Restrictions Weight Bearing Restrictions: No      Mobility  Bed Mobility Overal bed mobility: Needs Assistance Bed Mobility: Supine to Sit     Supine to sit: Supervision;HOB elevated     General bed mobility comments: increased time, supervision for safety  Transfers Overall transfer level: Needs assistance Equipment used: Rolling walker (2 wheeled) Transfers: Sit to/from Stand Sit to Stand: Min guard         General transfer comment: increased time, good technique, min guard for safety  Ambulation/Gait Ambulation/Gait assistance: Min guard Ambulation Distance (Feet): 100 Feet Assistive device: Rolling walker (2 wheeled) Gait Pattern/deviations: Step-through pattern;Decreased stride length Gait velocity: decreased Gait velocity interpretation: Below normal speed for age/gender General Gait Details: no instability or LOB, min guard for safety. Pt ambulated on RA with SPO2 decreasing to as low as 87%.    Stairs            Wheelchair Mobility    Modified Rankin (Stroke Patients Only)       Balance Overall balance assessment: Needs assistance;History of Falls Sitting-balance support: Feet supported Sitting balance-Leahy Scale: Good Sitting balance - Comments: able to sit EOB with supervision   Standing balance support: During functional activity;Single extremity supported Standing balance-Leahy Scale: Poor Standing balance comment: pt reliant on at least one UE support                             Pertinent Vitals/Pain Pain Assessment: No/denies pain    Home Living Family/patient expects to be discharged to:: Private residence Living Arrangements: Alone Available Help at Discharge: Family;Available PRN/intermittently Type of Home: House Home Access: Stairs to enter Entrance Stairs-Rails: None Entrance Stairs-Number of Steps: 1 Home Layout: One level Home Equipment: Walker - 2 wheels;Walker - 4 wheels;Bedside commode;Tub bench      Prior Function Level of Independence: Independent with assistive device(s)         Comments: pt reported that she uses a rollator to ambulate PRN, and independent with ADLs and IADLs.     Hand Dominance        Extremity/Trunk Assessment   Upper Extremity Assessment Upper Extremity Assessment: Overall WFL for tasks assessed    Lower Extremity Assessment Lower Extremity Assessment: Generalized weakness       Communication   Communication: No difficulties  Cognition Arousal/Alertness: Awake/alert Behavior During Therapy: WFL for tasks assessed/performed Overall Cognitive Status: Within Functional Limits for tasks assessed  General Comments      Exercises     Assessment/Plan    PT Assessment Patient needs continued PT services  PT Problem List Decreased strength;Decreased balance;Decreased mobility;Decreased coordination;Decreased knowledge of use  of DME;Decreased safety awareness       PT Treatment Interventions DME instruction;Gait training;Stair training;Functional mobility training;Therapeutic activities;Therapeutic exercise;Balance training;Neuromuscular re-education;Patient/family education    PT Goals (Current goals can be found in the Care Plan section)  Acute Rehab PT Goals Patient Stated Goal: return home PT Goal Formulation: With patient Time For Goal Achievement: 07/27/16 Potential to Achieve Goals: Good    Frequency Min 3X/week   Barriers to discharge        Co-evaluation               End of Session Equipment Utilized During Treatment: Gait belt Activity Tolerance: Patient tolerated treatment well Patient left: in chair;with call bell/phone within reach;with chair alarm set Nurse Communication: Mobility status PT Visit Diagnosis: Unsteadiness on feet (R26.81);History of falling (Z91.81)    Time: 1610-9604 PT Time Calculation (min) (ACUTE ONLY): 24 min   Charges:   PT Evaluation $PT Eval Low Complexity: 1 Procedure PT Treatments $Gait Training: 8-22 mins   PT G Codes:        [place], PT, DPT [place] 07/13/2016, 10:50 AM

## 2016-07-13 NOTE — ED Notes (Signed)
Date and time results received: 07/13/16  (use smartphrase ".now" to insert current time)  Test: troponin result was given to pt. Via EDP and PA Critical Value:   Name of Provider Notified: Dr. Rex Kras Orders Received? Or Actions Taken?:Troponin value

## 2016-07-13 NOTE — Progress Notes (Signed)
Patient having shortness of breath and expiratory wheezing. Respiratory therapist notified. Breathing treatment on going.

## 2016-07-14 ENCOUNTER — Inpatient Hospital Stay (HOSPITAL_COMMUNITY): Payer: Medicare Other

## 2016-07-14 DIAGNOSIS — I340 Nonrheumatic mitral (valve) insufficiency: Secondary | ICD-10-CM

## 2016-07-14 LAB — CBC
HEMATOCRIT: 24.9 % — AB (ref 36.0–46.0)
Hemoglobin: 7.4 g/dL — ABNORMAL LOW (ref 12.0–15.0)
MCH: 26.1 pg (ref 26.0–34.0)
MCHC: 29.7 g/dL — ABNORMAL LOW (ref 30.0–36.0)
MCV: 87.7 fL (ref 78.0–100.0)
PLATELETS: 209 10*3/uL (ref 150–400)
RBC: 2.84 MIL/uL — AB (ref 3.87–5.11)
RDW: 15.2 % (ref 11.5–15.5)
WBC: 7.4 10*3/uL (ref 4.0–10.5)

## 2016-07-14 LAB — GLUCOSE, CAPILLARY
GLUCOSE-CAPILLARY: 100 mg/dL — AB (ref 65–99)
Glucose-Capillary: 101 mg/dL — ABNORMAL HIGH (ref 65–99)
Glucose-Capillary: 93 mg/dL (ref 65–99)

## 2016-07-14 LAB — BASIC METABOLIC PANEL
ANION GAP: 8 (ref 5–15)
BUN: 17 mg/dL (ref 6–20)
CO2: 28 mmol/L (ref 22–32)
Calcium: 8.3 mg/dL — ABNORMAL LOW (ref 8.9–10.3)
Chloride: 102 mmol/L (ref 101–111)
Creatinine, Ser: 0.65 mg/dL (ref 0.44–1.00)
GLUCOSE: 98 mg/dL (ref 65–99)
POTASSIUM: 4 mmol/L (ref 3.5–5.1)
Sodium: 138 mmol/L (ref 135–145)

## 2016-07-14 LAB — ECHOCARDIOGRAM COMPLETE
Height: 66 in
Weight: 1972.8 oz

## 2016-07-14 MED ORDER — AZITHROMYCIN 500 MG PO TABS
500.0000 mg | ORAL_TABLET | Freq: Every day | ORAL | Status: AC
  Administered 2016-07-14 – 2016-07-18 (×5): 500 mg via ORAL
  Filled 2016-07-14 (×5): qty 1

## 2016-07-14 MED ORDER — LORAZEPAM 0.5 MG PO TABS
0.2500 mg | ORAL_TABLET | Freq: Four times a day (QID) | ORAL | Status: DC | PRN
  Administered 2016-07-14 – 2016-07-18 (×4): 0.25 mg via ORAL
  Filled 2016-07-14 (×5): qty 1

## 2016-07-14 MED ORDER — FLUOXETINE HCL 20 MG PO CAPS
60.0000 mg | ORAL_CAPSULE | Freq: Every day | ORAL | Status: DC
  Administered 2016-07-15 – 2016-07-20 (×5): 60 mg via ORAL
  Filled 2016-07-14 (×5): qty 3

## 2016-07-14 MED ORDER — GABAPENTIN 300 MG PO CAPS
300.0000 mg | ORAL_CAPSULE | Freq: Every morning | ORAL | Status: DC
  Administered 2016-07-15 – 2016-07-20 (×5): 300 mg via ORAL
  Filled 2016-07-14 (×5): qty 1

## 2016-07-14 MED ORDER — GABAPENTIN 300 MG PO CAPS
600.0000 mg | ORAL_CAPSULE | Freq: Every day | ORAL | Status: DC
  Administered 2016-07-14 – 2016-07-19 (×6): 600 mg via ORAL
  Filled 2016-07-14 (×6): qty 2

## 2016-07-14 NOTE — Progress Notes (Signed)
*  PRELIMINARY RESULTS* Echocardiogram 2D Echocardiogram has been performed.  Leavy Cella 07/14/2016, 3:37 PM

## 2016-07-14 NOTE — Progress Notes (Addendum)
Patient asked something for sleep but she stated that Ativan 0.5 mg is so strong for her, made her exteremly sleepy last night and she was sleepy for the most of the part today. Requesting smaller dose. Page NP on call for [facility]. New dose ordered 0.25 mg.  Will continue to monitor.  Rutherford Alarie, RN

## 2016-07-14 NOTE — Progress Notes (Signed)
PROGRESS NOTE        PATIENT DETAILS Name: Meredith Gonzalez Age: 81 y.o. Sex: female Date of Birth: 15-Nov-1935 Admit Date: 07/12/2016 Admitting Physician Vianne Bulls, MD EVO:JJKKX, Cecille Rubin, MD  Brief Narrative: Patient is a 81 y.o. female with history of CAD status post CABG, hypertension, depression, prior history of CVA with no major residual effects presented to the hospital with worsening shortness of breath, found to have multifocal pneumonia and admitted to the hospitalist service for further evaluation and treatment. Please see below for further details  Subjective: Slept well last night.   Think she is slowly improving.  No chest pain this morning. No nausea, vomiting or diarrhea.  Assessment/Plan: Community acquired pneumonia: Slowly improving-afebrile-without any leukocytosis, blood cultures currently negative, urine streptococcal antigen negative. Continue empiric Rocephin and Zithromax, continue to follow clinical course.  Elevated troponins: Thought to be secondary to demand ischemia, troponin trend is flat and not consistent with ACS. EKG remains nonacute. Continue aspirin. Await echocardiogram.  Anemia: Drop in hemoglobin today-suspect due to IV fluids, acute illness-denies melena/hematochezia. She is currently asymptomatic, repeat hemoglobin were morning-if continues to drop-may need PRBC transfusion.  History of CAD status post CABG: See above regarding troponins-continue aspirin and beta blocker.   History of hypertension: Controlled, continue pindolol.  Depression: Appears to be stable, continue Elavil and Prozac.  History of symptomatic bradycardia with syncope-status post permanent pacemaker implantation  DVT Prophylaxis: Prophylactic Lovenox   Code Status: Full code   Family Communication: None at bedside  Disposition Plan: Remain inpatient-in the next few days.  Antimicrobial agents: Anti-infectives    Start     Dose/Rate  Route Frequency Ordered Stop   07/14/16 2100  azithromycin (ZITHROMAX) tablet 500 mg     500 mg Oral Daily 07/14/16 1002 07/19/16 2059   07/13/16 2200  cefTRIAXone (ROCEPHIN) 1 g in dextrose 5 % 50 mL IVPB     1 g 100 mL/hr over 30 Minutes Intravenous Every 24 hours 07/13/16 0407 07/19/16 2159   07/13/16 2100  azithromycin (ZITHROMAX) 500 mg in dextrose 5 % 250 mL IVPB  Status:  Discontinued     500 mg 250 mL/hr over 60 Minutes Intravenous Every 24 hours 07/13/16 0407 07/14/16 1002   07/12/16 2319  azithromycin (ZITHROMAX) 500 MG injection    Comments:  Davis, Misty   : cabinet override      07/12/16 2319 07/13/16 1129   07/12/16 2315  cefTRIAXone (ROCEPHIN) 1 g in dextrose 5 % 50 mL IVPB     1 g 100 mL/hr over 30 Minutes Intravenous  Once 07/12/16 2300 07/13/16 0101   07/12/16 2315  azithromycin (ZITHROMAX) 500 mg in dextrose 5 % 250 mL IVPB     500 mg 250 mL/hr over 60 Minutes Intravenous  Once 07/12/16 2300 07/13/16 0204      Procedures: None  CONSULTS:  None  Time spent: 25- minutes-Greater than 50% of this time was spent in counseling, explanation of diagnosis, planning of further management, and coordination of care.  MEDICATIONS: Scheduled Meds: . acetaminophen  500 mg Oral Daily  . amitriptyline  25 mg Oral QHS  . aspirin EC  81 mg Oral Daily  . azithromycin  500 mg Oral Daily  . calcium-vitamin D  1 tablet Oral BID WC  . enoxaparin (LOVENOX) injection  40 mg Subcutaneous Q24H  . [START ON  07/15/2016] FLUoxetine  60 mg Oral Daily  . [START ON 07/15/2016] gabapentin  300 mg Oral q morning - 10a  . gabapentin  600 mg Oral QHS  . multivitamin with minerals  1 tablet Oral Daily  . pindolol  5 mg Oral BID  . sodium chloride flush  3 mL Intravenous Q12H   Continuous Infusions: . sodium chloride    . cefTRIAXone (ROCEPHIN)  IV 1 g (07/13/16 2154)   PRN Meds:.sodium chloride, diphenhydrAMINE, levalbuterol, LORazepam, sodium chloride flush   PHYSICAL EXAM: Vital  signs: Vitals:   07/13/16 2009 07/14/16 0626 07/14/16 1020 07/14/16 1304  BP: (!) 155/68 129/63 (!) 134/57 (!) 122/50  Pulse: 83 72 73 64  Resp: 20 18  20   Temp: 99.1 F (37.3 C) 98.5 F (36.9 C)  98.4 F (36.9 C)  TempSrc: Oral Oral  Oral  SpO2: 96% 100% 93% 93%  Weight:  55.9 kg (123 lb 4.8 oz)    Height:       Filed Weights   07/13/16 0319 07/14/16 0626  Weight: 55.8 kg (123 lb 1.6 oz) 55.9 kg (123 lb 4.8 oz)   Body mass index is 19.9 kg/m.   General appearance :Awake, alert, not in any distress. Appears frail. Eyes:, pupils equally reactive to light and accomodation,no scleral icterus. HEENT: Atraumatic and Normocephalic Neck: supple, no JVD. No cervical lymphadenopathy.  Resp:Good air entry bilaterally, no added sounds  CVS: S1 S2 regular, no murmurs.  GI: Bowel sounds present, Non tender and not distended with no gaurding, rigidity or rebound. Extremities: B/L Lower Ext shows no edema, both legs are warm to touch Neurology:  speech clear,Non focal, sensation is grossly intact. Psychiatric: Normal judgment and insight. Alert and oriented x 3. Normal mood. Musculoskeletal:No digital cyanosis Skin:No Rash, warm and dry Wounds:N/A  I have personally reviewed following labs and imaging studies  LABORATORY DATA: CBC:  Recent Labs Lab 07/12/16 2235 07/14/16 0440  WBC 11.1* 7.4  HGB 9.4* 7.4*  HCT 31.5* 24.9*  MCV 89.7 87.7  PLT 266 580    Basic Metabolic Panel:  Recent Labs Lab 07/12/16 2235 07/14/16 0440  NA 138 138  K 4.3 4.0  CL 102 102  CO2 28 28  GLUCOSE 171* 98  BUN 19 17  CREATININE 0.65 0.65  CALCIUM 8.7* 8.3*    GFR: Estimated Creatinine Clearance: 51.1 mL/min (by C-G formula based on SCr of 0.65 mg/dL).  Liver Function Tests: No results for input(s): AST, ALT, ALKPHOS, BILITOT, PROT, ALBUMIN in the last 168 hours. No results for input(s): LIPASE, AMYLASE in the last 168 hours. No results for input(s): AMMONIA in the last 168  hours.  Coagulation Profile: No results for input(s): INR, PROTIME in the last 168 hours.  Cardiac Enzymes:  Recent Labs Lab 07/12/16 2235 07/13/16 0528 07/13/16 1213 07/13/16 1646  TROPONINI 1.11* 1.13* 0.93* 0.86*    BNP (last 3 results) No results for input(s): PROBNP in the last 8760 hours.  HbA1C: No results for input(s): HGBA1C in the last 72 hours.  CBG:  Recent Labs Lab 07/13/16 1124 07/13/16 1622 07/13/16 2115 07/14/16 0734 07/14/16 1209  GLUCAP 128* 122* 168* 101* 100*    Lipid Profile: No results for input(s): CHOL, HDL, LDLCALC, TRIG, CHOLHDL, LDLDIRECT in the last 72 hours.  Thyroid Function Tests: No results for input(s): TSH, T4TOTAL, FREET4, T3FREE, THYROIDAB in the last 72 hours.  Anemia Panel: No results for input(s): VITAMINB12, FOLATE, FERRITIN, TIBC, IRON, RETICCTPCT in the last 72 hours.  Urine analysis: No results found for: COLORURINE, APPEARANCEUR, LABSPEC, PHURINE, GLUCOSEU, HGBUR, BILIRUBINUR, KETONESUR, PROTEINUR, UROBILINOGEN, NITRITE, LEUKOCYTESUR  Sepsis Labs: Lactic Acid, Venous    Component Value Date/Time   LATICACIDVEN 0.93 07/12/2016 2243    MICROBIOLOGY: Recent Results (from the past 240 hour(s))  Blood culture (routine x 2)     Status: None (Preliminary result)   Collection Time: 07/12/16 11:53 PM  Result Value Ref Range Status   Specimen Description BLOOD RIGHT ARM  Final   Special Requests   Final    BOTTLES DRAWN AEROBIC AND ANAEROBIC Blood Culture adequate volume   Culture   Final    NO GROWTH < 24 HOURS Performed at [facility], [address] [place], [place] [address]    Report Status PENDING  Incomplete  Blood culture (routine x 2)     Status: None (Preliminary result)   Collection Time: 07/13/16 12:02 AM  Result Value Ref Range Status   Specimen Description BLOOD RIGHT ARM  Final   Special Requests   Final    BOTTLES DRAWN AEROBIC AND ANAEROBIC Blood Culture adequate volume   Culture   Final     NO GROWTH < 24 HOURS Performed at [facility], [place] [address], [place], [place] [address]    Report Status PENDING  Incomplete    RADIOLOGY STUDIES/RESULTS: Dg Chest 2 View  Result Date: 07/12/2016 CLINICAL DATA:  Respiratory difficulty. Shortness of breath. Previous smoker. History of pneumonia and bronchitis. EXAM: CHEST  2 VIEW COMPARISON:  05/01/2015 FINDINGS: Cardiac pacemaker. Shallow inspiration. Perihilar airspace and interstitial changes bilaterally. This is new since previous study. Peribronchial thickening. Changes likely to represent multifocal pneumonia. Normal heart size. No blunting of costophrenic angles. No pneumothorax. Calcified and tortuous aorta. Old fracture deformity of the right proximal humerus. Inferior vena caval filter. IMPRESSION: Bronchial wall thickening with perihilar interstitial and airspace disease new since previous study. Changes likely to represent multifocal pneumonia. Electronically Signed   By: Lucienne Capers M.D.   On: 07/12/2016 22:55     LOS: 1 day   Oren Binet, MD  [facility] Pager:[phone]  If 7PM-7AM, please contact night-coverage [email or web address] Password [facility] 07/14/2016, 1:41 PM

## 2016-07-14 NOTE — Progress Notes (Signed)
Patient states she is still short of breath with exertion such as walking to bathroom, O2 sat 96% on room air.  RN did educate patient that if she feels short of breath she can wear oxygen for comfort.  Patient defers, says she feels better once she rests for a minute.

## 2016-07-14 NOTE — Plan of Care (Signed)
Problem: Education: Goal: Knowledge of [place] [facility] Education information/materials will improve Outcome: Progressing POC reviewed with pt.  Problem: Safety: Goal: Ability to remain free from injury will improve Outcome: Progressing Bed alarm on.

## 2016-07-14 NOTE — Progress Notes (Signed)
Pt. seem confused after Ativan 0.5mg  and pulled both IV's out.

## 2016-07-15 ENCOUNTER — Inpatient Hospital Stay (HOSPITAL_COMMUNITY): Payer: Medicare Other

## 2016-07-15 ENCOUNTER — Encounter (HOSPITAL_COMMUNITY): Payer: Self-pay | Admitting: Physician Assistant

## 2016-07-15 DIAGNOSIS — R296 Repeated falls: Secondary | ICD-10-CM

## 2016-07-15 DIAGNOSIS — R079 Chest pain, unspecified: Secondary | ICD-10-CM

## 2016-07-15 DIAGNOSIS — Z950 Presence of cardiac pacemaker: Secondary | ICD-10-CM

## 2016-07-15 LAB — CBC
HCT: 25.8 % — ABNORMAL LOW (ref 36.0–46.0)
Hemoglobin: 7.6 g/dL — ABNORMAL LOW (ref 12.0–15.0)
MCH: 25.8 pg — AB (ref 26.0–34.0)
MCHC: 29.5 g/dL — AB (ref 30.0–36.0)
MCV: 87.5 fL (ref 78.0–100.0)
PLATELETS: 233 10*3/uL (ref 150–400)
RBC: 2.95 MIL/uL — AB (ref 3.87–5.11)
RDW: 14.9 % (ref 11.5–15.5)
WBC: 7.5 10*3/uL (ref 4.0–10.5)

## 2016-07-15 LAB — IRON AND TIBC
IRON: 7 ug/dL — AB (ref 28–170)
Saturation Ratios: 2 % — ABNORMAL LOW (ref 10.4–31.8)
TIBC: 321 ug/dL (ref 250–450)
UIBC: 314 ug/dL

## 2016-07-15 LAB — BASIC METABOLIC PANEL
Anion gap: 7 (ref 5–15)
BUN: 13 mg/dL (ref 6–20)
CHLORIDE: 101 mmol/L (ref 101–111)
CO2: 28 mmol/L (ref 22–32)
CREATININE: 0.58 mg/dL (ref 0.44–1.00)
Calcium: 8.4 mg/dL — ABNORMAL LOW (ref 8.9–10.3)
GFR calc Af Amer: >60 mL/min (ref >60)
GLUCOSE: 102 mg/dL — AB (ref 65–99)
POTASSIUM: 4 mmol/L (ref 3.5–5.1)
Sodium: 136 mmol/L (ref 135–145)

## 2016-07-15 LAB — RETICULOCYTES
RBC.: 2.95 MIL/uL — ABNORMAL LOW (ref 3.87–5.11)
Retic Count, Absolute: 47.2 10*3/uL (ref 19.0–186.0)
Retic Ct Pct: 1.6 % (ref 0.4–3.1)

## 2016-07-15 LAB — FERRITIN: Ferritin: 31 ng/mL (ref 11–307)

## 2016-07-15 LAB — FOLATE: Folate: 29.4 ng/mL (ref >5.9)

## 2016-07-15 LAB — VITAMIN B12: Vitamin B-12: 157 pg/mL — ABNORMAL LOW (ref 180–914)

## 2016-07-15 MED ORDER — CYANOCOBALAMIN 1000 MCG/ML IJ SOLN
1000.0000 ug | Freq: Every day | INTRAMUSCULAR | Status: DC
  Administered 2016-07-15 – 2016-07-19 (×5): 1000 ug via SUBCUTANEOUS
  Filled 2016-07-15 (×6): qty 1

## 2016-07-15 MED ORDER — ACETAMINOPHEN 325 MG PO TABS
650.0000 mg | ORAL_TABLET | Freq: Four times a day (QID) | ORAL | Status: DC | PRN
  Administered 2016-07-15 – 2016-07-18 (×2): 650 mg via ORAL
  Filled 2016-07-15 (×3): qty 2

## 2016-07-15 MED ORDER — FERROUS SULFATE 325 (65 FE) MG PO TABS
325.0000 mg | ORAL_TABLET | Freq: Two times a day (BID) | ORAL | Status: DC
Start: 2016-07-15 — End: 2016-07-20
  Administered 2016-07-15 – 2016-07-20 (×7): 325 mg via ORAL
  Filled 2016-07-15 (×7): qty 1

## 2016-07-15 NOTE — Progress Notes (Signed)
Physical Therapy Treatment Patient Details Name: Meredith Gonzalez MRN: [id number] DOB: 09/09/1935 Today's Date: 07/15/2016    History of Present Illness Pt is a 81 y/o female admitted secondary to CAP. PMH including but not limited to CAD s/p CABG, HTN, CVA, DM and pacemaker insertion.    PT Comments    Patient required min/mod A for safe ambulation this session. No AD used (rail in hallway at times) this session as pt reported she does not use AD most of the time. Pt with balance deficits requiring mod A to recover from several LOB. SpO2 down to 85% on RA with mobility and up to 93% end of session on 1L O2 via [place]. Given pt's balance deficits, history of falls, and 3/4 DOE with ambulation recommending HHPT to maximize independence and safety with mobility.     Follow Up Recommendations  Home health PT;Supervision - Intermittent     Equipment Recommendations  None recommended by PT (pt has RW and rollator)    Recommendations for Other Services       Precautions / Restrictions Precautions Precautions: Fall Restrictions Weight Bearing Restrictions: No    Mobility  Bed Mobility Overal bed mobility: Needs Assistance Bed Mobility: Supine to Sit     Supine to sit: HOB elevated;Modified independent (Device/Increase time)     General bed mobility comments: increased time  Transfers Overall transfer level: Needs assistance Equipment used: None Transfers: Sit to/from Stand Sit to Stand: Min guard;Mod assist         General transfer comment: min guard to power up into standing and mod A required upon standing due to posterior LOB  Ambulation/Gait Ambulation/Gait assistance: Min assist;Mod assist Ambulation Distance (Feet): 160 Feet Assistive device:  (used rail in hallway ~75% time) Gait Pattern/deviations: Step-through pattern;Decreased stride length Gait velocity: decreased   General Gait Details: pt requested to not use AD and reported she uses rollator PRN at home; pt  unsteady and required assitance to recover from LOB X3 and several standing rest breaks due to 3/4 DOE and SpO2 down to 85% on RA; pt educated on pursed lip breathing   Stairs Stairs: Yes   Stair Management: One rail Right;Step to pattern;Forwards;Sideways Number of Stairs: 2 General stair comments: cues for step to pattern; ascended forward and descended sideways  Wheelchair Mobility    Modified Rankin (Stroke Patients Only)       Balance Overall balance assessment: Needs assistance;History of Falls Sitting-balance support: Feet supported Sitting balance-Leahy Scale: Good Sitting balance - Comments: able to sit EOB with supervision   Standing balance support: During functional activity;Single extremity supported Standing balance-Leahy Scale: Poor Standing balance comment: pt reliant on at least one UE support             High level balance activites: Direction changes;Head turns High Level Balance Comments: pt required mod A when performing head turns and experienced significant distubances to gait pattern without AD            Cognition Arousal/Alertness: Awake/alert Behavior During Therapy: WFL for tasks assessed/performed Overall Cognitive Status: Within Functional Limits for tasks assessed                                        Exercises      General Comments General comments (skin integrity, edema, etc.): pt educated on importance of using RW inside home and rollator for longer distances and for going  to her mailbox due to multiple falls and balance deficits      Pertinent Vitals/Pain Pain Assessment: No/denies pain    Home Living                      Prior Function            PT Goals (current goals can now be found in the care plan section) Acute Rehab PT Goals Patient Stated Goal: return home PT Goal Formulation: With patient Time For Goal Achievement: 07/27/16 Potential to Achieve Goals: Good Progress towards PT  goals: Progressing toward goals    Frequency    Min 3X/week      PT Plan Discharge plan needs to be updated    Co-evaluation             End of Session Equipment Utilized During Treatment: Gait belt Activity Tolerance: Patient tolerated treatment well Patient left: in chair;with call bell/phone within reach;with chair alarm set Nurse Communication: Mobility status PT Visit Diagnosis: Unsteadiness on feet (R26.81);History of falling (Z91.81)     Time: 1624-4695 PT Time Calculation (min) (ACUTE ONLY): 26 min  Charges:  $Gait Training: 8-22 mins $Therapeutic Activity: 8-22 mins                    G Codes:       Earney Navy, PTA Pager: [phone]     Darliss Cheney 07/15/2016, 10:08 AM

## 2016-07-15 NOTE — Progress Notes (Signed)
Patient with no complaints or concerns during 7pm - 7am shift. Slept during the night. Stated that she feels good this morning.  Will continue to monitor.  Yaritzel Stange, RN

## 2016-07-15 NOTE — Consult Note (Signed)
CARDIOLOGY CONSULT NOTE   Patient ID: Meredith Gonzalez MRN: [id number] DOB/AGE: 1935-11-22 81 y.o.  Admit date: 07/12/2016  Requesting Physician: Dr. Sloan Leiter Primary Physician:   Sinclair Ship, MD Primary Cardiologist:  Dr. Ross/ Dr. Lovena Le (EP) Reason for Consultation: elevated troponin   Meredith Gonzalez is a 81 y.o. female who is being seen today for the evaluation of elevated troponin at the request of Dr. Sloan Leiter.   HPI: Meredith Gonzalez is a 81 y.o. 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   OF NOTE: SHE HAS A HISTORY OF CAD S/P CABG IN HER CHART. THIS IS ERRONEOUS. I HAVE DELETED THIS FROM PMH. Cath 1995. No CAD with LVEF 55%.  She had a nuclear stress test back in 2013 with no ischemia. Xarelto was discontinued given hx of syncope, recurrent falls and subdural hematoma. Apparently, she had a negative tilt table test in 2014 in [place] but BP did drop significantly, so diltiazem and amlodipine were discontinued and she was started on pindolol.   She was last seen by Dr. Harrington Challenger and Dr Lovena Le in 5-08/2015 and doing well.   She was in her usual state of health until 07/12/16 when she presented to [facility] with SOB. Her troponin was noted to be elevated to 1.1. CXR showed multifocal pneumonia and leukocytosis WBC 11.1, Hbg 9.4, lactic acid 0.9. Cardiology was consulted but recommended trending troponins as symptoms could be secondary to pneumonia. Cultures were obtained patient was started on antibiotics of Rocephin and azithromycin. She was transferred to [facility] for further care.   She is currently feeling much better in terms of her breathing. No LE edema, orthopnea or PND. No dizziness or syncope. No blood in stool or urine. No palpitations. She does report having had chest  pain about 1 week ago that lasted all day. It was pleuritic and worse with a deep breath in. It radiated to her back. Not related to exertion. No associated SOB, diaphoresis or nausea. She was about to go to the ED for evaluation but the chest pain self resolved with no recurrence and she decided not to come in.   She lives in an independent living facility in [facility] and is relatively active. She denies any decreased exercise tolerance or exertional CP or SOB.   Past Medical History:  Diagnosis Date  . Anemia   . Arthritis   . Diabetes mellitus without complication ([place])   . Fibromyalgia   . Fracture of right humerus   . Generalized weakness   . Hypothyroidism   . Incontinence of urine   . Major depression, chronic   . Neuropathy   . Pacemaker   . Recurrent falls   . Stroke ([place]) 2000  . Syncope and collapse      Past Surgical History:  Procedure Laterality Date  . ABDOMINAL HYSTERECTOMY    . basal cell cancer removal    . CARPAL TUNNEL RELEASE     bilateral  . CHOLECYSTECTOMY    . CHOLECYSTECTOMY    . COLONOSCOPY    . EYE SURGERY     right eye catarace/lens implant  . FEMUR FRACTURE SURGERY     bilateral  . GASTRIC BYPASS    . LEAD REVISION N/A 01/01/2013   Procedure: LEAD REVISION;  Surgeon: Carleene Overlie  Peyton Najjar, MD;  Location: [facility] CATH LAB;  Service: Cardiovascular;  Laterality: N/A;  . PACEMAKER INSERTION    . right ankle surgery      Allergies  Allergen Reactions  . Valium [Diazepam] Anxiety    Makes patient hyper Makes patient hyper  . Darifenacin Itching    Can take with Benadryl  . Darvon [Propoxyphene Hcl] Itching    Can take with Benadryl  . Daypro [Oxaprozin] Itching    Can take with Benadryl Can take with Benadryl  . Enablex [Darifenacin Hydrobromide Er] Itching    Can take with Benadryl  . Propoxyphene Itching    Can take with Benadryl  . Risperdal [Risperidone] Itching    Can take with Benadryl Can take with Benadryl  . Talwin [Pentazocine]  Itching    Can take with Benadryl Can take with Benadryl  . Vicodin [Hydrocodone-Acetaminophen] Itching    Can take with Benadryl Can take with Benadryl  . Codeine Hives and Itching    Can take with Benadryl Can take with Benadryl  . Oxycodone Itching    Can take with Benadryl Can take with Benadryl Can take with Benadryl Can take with Benadryl    I have reviewed the patient's current medications . acetaminophen  500 mg Oral Daily  . amitriptyline  25 mg Oral QHS  . aspirin EC  81 mg Oral Daily  . azithromycin  500 mg Oral Daily  . calcium-vitamin D  1 tablet Oral BID WC  . cyanocobalamin  1,000 mcg Subcutaneous Daily  . enoxaparin (LOVENOX) injection  40 mg Subcutaneous Q24H  . ferrous sulfate  325 mg Oral BID WC  . FLUoxetine  60 mg Oral Daily  . gabapentin  300 mg Oral q morning - 10a  . gabapentin  600 mg Oral QHS  . multivitamin with minerals  1 tablet Oral Daily  . pindolol  5 mg Oral BID  . sodium chloride flush  3 mL Intravenous Q12H   . sodium chloride    . cefTRIAXone (ROCEPHIN)  IV Stopped (07/14/16 2200)   sodium chloride, acetaminophen, diphenhydrAMINE, levalbuterol, LORazepam, sodium chloride flush  Prior to Admission medications   Medication Sig Start Date End Date Taking? Authorizing Provider  acetaminophen (TYLENOL) 500 MG tablet Take 500 mg by mouth daily.    Yes Historical Provider, MD  amitriptyline (ELAVIL) 25 MG tablet Take 25 mg by mouth at bedtime.   Yes Historical Provider, MD  ammonium lactate (LAC-HYDRIN) 12 % lotion Apply 1 application topically 2 (two) times daily. 07/05/16  Yes Historical Provider, MD  aspirin EC 81 MG tablet Take 1 tablet (81 mg total) by mouth daily. 03/29/13  Yes Fay Records, MD  Calcium Citrate-Vitamin D3 315-250 MG-UNIT TABS Take 1 tablet by mouth 2 (two) times daily.   Yes Historical Provider, MD  diphenhydrAMINE (BENADRYL ALLERGY) 25 MG tablet Take 25 mg by mouth every 8 (eight) hours as needed.   Yes Historical Provider,  MD  flintstones complete (FLINTSTONES) 60 MG chewable tablet Chew 1 tablet by mouth daily.   Yes Historical Provider, MD  FLUoxetine (PROZAC) 20 MG capsule Take 60 mg by mouth daily.  09/12/15  Yes Historical Provider, MD  gabapentin (NEURONTIN) 300 MG capsule Take 300-600 mg by mouth 2 (two) times daily. 300 mg in the mornning; 600 mg at night   Yes Historical Provider, MD  ibuprofen (ADVIL,MOTRIN) 200 MG tablet Take 800 mg by mouth every 6 (six) hours as needed for mild pain.   Yes  Historical Provider, MD  Melatonin 10 MG TABS Take 40 mg by mouth at bedtime.   Yes Historical Provider, MD  Multiple Vitamins-Minerals (MULTIVITAMIN ADULT) TABS Take 1 tablet by mouth daily.   Yes Historical Provider, MD  pindolol (VISKEN) 5 MG tablet Take 1 tablet (5 mg total) by mouth 2 (two) times daily. 03/01/13  Yes Fay Records, MD  pramipexole (MIRAPEX) 0.25 MG tablet Take 0.25 mg by mouth every morning. 05/17/16  Yes Historical Provider, MD     Social History   Social History  . Marital status: Widowed    Spouse name: N/A  . Number of children: N/A  . Years of education: N/A   Occupational History  . Not on file.   Social History Main Topics  . Smoking status: Former Smoker    Packs/day: 0.50    Years: 4.00    Types: Cigarettes    Quit date: 08/07/1972  . Smokeless tobacco: Never Used  . Alcohol use Yes     Comment: wine cooler once or twice per year  . Drug use: No  . Sexual activity: No   Other Topics Concern  . Not on file   Social History Narrative  . No narrative on file    Family Status  Relation Status  . Mother Deceased   chf  . Father Deceased at age in his 70's   heart attack  . Sister Alive  . Brother Alive  . Brother Deceased at age 49   cancer and etoh   Family History  Problem Relation Age of Onset  . Congestive Heart Failure Mother   . Heart attack Father   . Alcoholism Father   . Alcoholism Sister   . Alcoholism Brother   . Alcoholism Brother   . Throat  cancer Brother       ROS:  Full 14 point review of systems complete and found to be negative unless listed above.  Physical Exam: Blood pressure (!) 122/58, pulse 81, temperature 98.4 F (36.9 C), temperature source Oral, resp. rate 20, height 5\' 6"  (1.676 m), weight 122 lb (55.3 kg), SpO2 95 %.  General: Well developed, well nourished, female in no acute distress Head: Eyes PERRLA, No xanthomas.   Normocephalic and atraumatic, oropharynx without edema or exudate.  Lungs: diffuse crackles and some faint wheezing Heart: HRRR S1 S2, no rub/gallop, Heart regular rate and rhythm with S1, S2  murmur. pulses are 2+ extrem.   Neck: No carotid bruits. No lymphadenopathy. no JVD. Abdomen: Bowel sounds present, abdomen soft and non-tender without masses or hernias noted. Msk:  No spine or cva tenderness. No weakness, no joint deformities or effusions. Extremities: No clubbing or cyanosis. No LE edema.  Neuro: Alert and oriented X 3. No focal deficits noted. Psych:  Good affect, responds appropriately Skin: No rashes or lesions noted.  Labs:   Lab Results  Component Value Date   WBC 7.5 07/15/2016   HGB 7.6 (L) 07/15/2016   HCT 25.8 (L) 07/15/2016   MCV 87.5 07/15/2016   PLT 233 07/15/2016   No results for input(s): INR in the last 72 hours.   Recent Labs Lab 07/15/16 0457  NA 136  K 4.0  CL 101  CO2 28  BUN 13  CREATININE 0.58  CALCIUM 8.4*  GLUCOSE 102*   No results found for: MG  Recent Labs  07/12/16 2235 07/13/16 0528 07/13/16 1213 07/13/16 1646  TROPONINI 1.11* 1.13* 0.93* 0.86*   No results for input(s): TROPIPOC in  the last 72 hours. Pro B Natriuretic peptide (BNP)  Date/Time Value Ref Range Status  03/03/2014 09:29 AM 418.0 (H) 0.0 - 100.0 pg/mL Final  07/30/2013 12:09 PM 214.0 (H) 0.0 - 100.0 pg/mL Final   No results found for: CHOL, HDL, LDLCALC, TRIG No results found for: DDIMER No results found for: LIPASE, AMYLASE TSH  Date/Time Value Ref Range  Status  07/30/2013 12:09 PM 3.69 0.35 - 4.50 uIU/mL Final   Vitamin B-12  Date/Time Value Ref Range Status  07/15/2016 04:57 AM 157 (L) 180 - 914 pg/mL Final    Comment:    (NOTE) This assay is not validated for testing neonatal or myeloproliferative syndrome specimens for Vitamin B12 levels.    Folate  Date/Time Value Ref Range Status  07/15/2016 04:57 AM 29.4 >5.9 ng/mL Final   Ferritin  Date/Time Value Ref Range Status  07/15/2016 04:57 AM 31 11 - 307 ng/mL Final   TIBC  Date/Time Value Ref Range Status  07/15/2016 04:57 AM 321 250 - 450 ug/dL Final   Iron  Date/Time Value Ref Range Status  07/15/2016 04:57 AM 7 (L) 28 - 170 ug/dL Final   Retic Ct Pct  Date/Time Value Ref Range Status  07/15/2016 04:57 AM 1.6 0.4 - 3.1 % Final    Echo: 07/14/2016 Study Conclusions - Left ventricle: There is possible apical and apical   inferolateral, apical anterior and apical lateral HK but   endocardial segments poorly visualized. recommend limited echo   with definity contrast. The cavity size was mildly dilated.   Systolic function was normal. Features are consistent with a   pseudonormal left ventricular filling pattern, with concomitant   abnormal relaxation and increased filling pressure (grade 2   diastolic dysfunction). Doppler parameters are consistent with   high ventricular filling pressure. - Mitral valve: There was trivial regurgitation. - Left atrium: The atrium was mildly dilated. Anterior-posterior   dimension: 41 mm. - Pulmonary arteries: PA peak pressure: 50 mm Hg (S). - Recommendations: Recommend limite echo with definity contrast to   assess wall motion. Impressions: - The right ventricular systolic pressure was increased consistent   with moderate pulmonary hypertension.  ECG: NSR, RBBB, LAFB, HR 83  - personally reviewed  TELE: NSR - personally reviewed  Radiology:  No results found.  ASSESSMENT AND PLAN:    Principal Problem:   Multifocal  pneumonia Active Problems:   Recurrent falls   Syncope   SDH (subdural hematoma) (HCC)   Anemia   Depression   Hypertension   Diabetes ([place])   Pacemaker   Elevated troponin   Leukocytosis  Meredith Gonzalez is a 81 y.o. 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   Elevated troponin: troponin 1.11--> 1.13--> 0.93-->  0.86. She did have some chest pain last week that was pleuritic and non exertional. She has not had any chest pain recurrence since that time or during this admission. Could also be demand ischemia from acute PNA. 2D ECHO this admission shows possible apical WMA but not an adequate study (recommend limited echo with definity contrast to assess wall motion.) I think she will need some sort of ischemic work up. Not sure we would want to cath her given worsening anemia. Could consider stress testing tomorrow or as an outpatient. Will defer to Dr.  Meda Coffee.  CAP: continue abx per IM  Anemia: she has a long history of anemia. Hg dropped during admission 9.4--> 7.4--> 7.6. Could be from hemodilution from IVFs. Continue to monitor  HTN: BP has been well controlled   PAF: maintaining NSR. Not on [facility] given history of recurrent falls, syncope and SDH. CHADSVASC of at least 6 (HTN, age, F sex, CVA). Continue pindolol   Symptomatic bradycardia s/p PPM: followed by Dr. Lovena Le. Last interrogated 07/01/16 and device functioning well with good battery life.   Hx of CVA: continue asa   Pulmonary HTN: PA pressure 50 on echo this admission.   Signed: Angelena Form, PA-C 07/15/2016 9:15 AM  Pager [phone]  Co-Sign MD   The patient was seen, examined and discussed with Nell Range, PA-C and I agree with the above.    Meredith Gonzalez is a 81 y.o. 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 The patient had a LHC in 1995. No CAD with LVEF 55%.  She had a nuclear stress test back in 2013 with no ischemia. Xarelto was discontinued given hx of syncope, recurrent falls and subdural hematoma. She was last seen by Dr. Harrington Challenger and Dr Lovena Le in 5-08/2015 and doing well.  She was in her usual state of health until 07/12/16 when she presented to [facility] with SOB. Her troponin was noted to be elevated to 1.1. CXR showed multifocal pneumonia and leukocytosis WBC 11.1, Hbg 9.4, lactic acid 0.9. Cardiology was consulted but recommended trending troponins as symptoms could be secondary to pneumonia. Cultures were obtained patient was started on antibiotics of Rocephin and azithromycin.  She admits to exertional dyspnea and CP for the last 2-3 months, no resting chest pain. ECG shows paced rhythm. Labs show normal Crea, Hb 7.6 that might be contributing to her symptoms. Consider GI workup, she denies bleeding or melena.  We will plan for a Lexiscan nuclear stress test for tomorrow.   Ena Dawley, MD 07/15/2016

## 2016-07-15 NOTE — Progress Notes (Signed)
PROGRESS NOTE        PATIENT DETAILS Name: Meredith Gonzalez Age: 81 y.o. Sex: female Date of Birth: 1935-12-08 Admit Date: 07/12/2016 Admitting Physician Vianne Bulls, MD DVV:OHYWV, Cecille Rubin, MD  Brief Narrative: Patient is a 81 y.o. female with history of CAD status post CABG, hypertension, depression, prior history of CVA with no major residual effects presented to the hospital with worsening shortness of breath, found to have multifocal pneumonia and admitted to the hospitalist service for further evaluation and treatment. Please see below for further details  Subjective: Feels much better- no chest pain or shortness of breath.  No nausea, vomiting No epigastric pain No melena or hematochezia  Assessment/Plan: Community acquired pneumonia: Improved, afebrile, no leukocytosis, blood cultures currently negative-urine streptococcal antigen negative. Continue Rocephin and Zithromax-suspect could transition to oral anticoagulant agents in the next day or so.   Elevated troponins: Likely secondary to demand ischemia-trend is flat and not consistent with ACS-have consulted cardiology. Repeat limited echo today.  Chronic normocytic Anemia: Chronic normocytic anemia at baseline-suspect drop in hemoglobin is secondary to acute illness and IV fluid dilution. Hemoglobin better than yesterday-no evidence of GI bleeding. Significantly low vitamin B12 levels-.vitamin B12 supplementation.Suspect we could continue to monitor without any further workup in the inpatient setting. If her hemoglobin does not increase in the next few weeks, may consider outpatient workup then.   History of CAD: Continue aspirin and beta blocker-see above regarding positive troponins.   History of hypertension: Controlled, continue atenolol.   Depression: Appears to be stable, continue Elavil and Prozac.  History of symptomatic bradycardia with syncope-status post permanent pacemaker  implantation  DVT Prophylaxis: Prophylactic Lovenox   Code Status: Full code   Family Communication: None at bedside  Disposition Plan: Remain inpatient-in the next few days.  Antimicrobial agents: Anti-infectives    Start     Dose/Rate Route Frequency Ordered Stop   07/14/16 2100  azithromycin (ZITHROMAX) tablet 500 mg     500 mg Oral Daily 07/14/16 1002 07/19/16 2059   07/13/16 2200  cefTRIAXone (ROCEPHIN) 1 g in dextrose 5 % 50 mL IVPB     1 g 100 mL/hr over 30 Minutes Intravenous Every 24 hours 07/13/16 0407 07/19/16 2159   07/13/16 2100  azithromycin (ZITHROMAX) 500 mg in dextrose 5 % 250 mL IVPB  Status:  Discontinued     500 mg 250 mL/hr over 60 Minutes Intravenous Every 24 hours 07/13/16 0407 07/14/16 1002   07/12/16 2319  azithromycin (ZITHROMAX) 500 MG injection    Comments:  Davis, Misty   : cabinet override      07/12/16 2319 07/13/16 1129   07/12/16 2315  cefTRIAXone (ROCEPHIN) 1 g in dextrose 5 % 50 mL IVPB     1 g 100 mL/hr over 30 Minutes Intravenous  Once 07/12/16 2300 07/13/16 0101   07/12/16 2315  azithromycin (ZITHROMAX) 500 mg in dextrose 5 % 250 mL IVPB     500 mg 250 mL/hr over 60 Minutes Intravenous  Once 07/12/16 2300 07/13/16 0204      Procedures: None  CONSULTS:  None  Time spent: 25- minutes-Greater than 50% of this time was spent in counseling, explanation of diagnosis, planning of further management, and coordination of care.  MEDICATIONS: Scheduled Meds: . acetaminophen  500 mg Oral Daily  . amitriptyline  25 mg Oral QHS  . aspirin  EC  81 mg Oral Daily  . azithromycin  500 mg Oral Daily  . calcium-vitamin D  1 tablet Oral BID WC  . cyanocobalamin  1,000 mcg Subcutaneous Daily  . enoxaparin (LOVENOX) injection  40 mg Subcutaneous Q24H  . ferrous sulfate  325 mg Oral BID WC  . FLUoxetine  60 mg Oral Daily  . gabapentin  300 mg Oral q morning - 10a  . gabapentin  600 mg Oral QHS  . multivitamin with minerals  1 tablet Oral Daily   . pindolol  5 mg Oral BID  . sodium chloride flush  3 mL Intravenous Q12H   Continuous Infusions: . sodium chloride    . cefTRIAXone (ROCEPHIN)  IV Stopped (07/14/16 2200)   PRN Meds:.sodium chloride, acetaminophen, diphenhydrAMINE, levalbuterol, LORazepam, sodium chloride flush   PHYSICAL EXAM: Vital signs: Vitals:   07/14/16 1726 07/14/16 2033 07/15/16 0540 07/15/16 1142  BP:  (!) 160/95 (!) 122/58 (!) 114/45  Pulse:  80 81 67  Resp:  20 20 18   Temp:  98.7 F (37.1 C) 98.4 F (36.9 C) 98.2 F (36.8 C)  TempSrc:  Oral Oral Oral  SpO2: 96% 97% 95% 99%  Weight:   55.3 kg (122 lb)   Height:       Filed Weights   07/13/16 0319 07/14/16 0626 07/15/16 0540  Weight: 55.8 kg (123 lb 1.6 oz) 55.9 kg (123 lb 4.8 oz) 55.3 kg (122 lb)   Body mass index is 19.69 kg/m.   General appearance :Awake, alert, not in any distress.  Eyes:, pupils equally reactive to light and accomodation,no scleral icterus. HEENT: Atraumatic and Normocephalic Neck: supple, no JVD. Resp:Good air entry bilaterally CVS: S1 S2 regular, no murmurs.  GI: Bowel sounds present, Non tender and not distended with no gaurding, rigidity or rebound. Extremities: B/L Lower Ext shows no edema, both legs are warm to touch Neurology:  speech clear,Non focal, sensation is grossly intact. Psychiatric: Normal judgment and insight. Normal mood. Musculoskeletal:No digital cyanosis Skin:No Rash, warm and dry Wounds:N/A  I have personally reviewed following labs and imaging studies  LABORATORY DATA: CBC:  Recent Labs Lab 07/12/16 2235 07/14/16 0440 07/15/16 0457  WBC 11.1* 7.4 7.5  HGB 9.4* 7.4* 7.6*  HCT 31.5* 24.9* 25.8*  MCV 89.7 87.7 87.5  PLT 266 209 791    Basic Metabolic Panel:  Recent Labs Lab 07/12/16 2235 07/14/16 0440 07/15/16 0457  NA 138 138 136  K 4.3 4.0 4.0  CL 102 102 101  CO2 28 28 28   GLUCOSE 171* 98 102*  BUN 19 17 13   CREATININE 0.65 0.65 0.58  CALCIUM 8.7* 8.3* 8.4*     GFR: Estimated Creatinine Clearance: 50.6 mL/min (by C-G formula based on SCr of 0.58 mg/dL).  Liver Function Tests: No results for input(s): AST, ALT, ALKPHOS, BILITOT, PROT, ALBUMIN in the last 168 hours. No results for input(s): LIPASE, AMYLASE in the last 168 hours. No results for input(s): AMMONIA in the last 168 hours.  Coagulation Profile: No results for input(s): INR, PROTIME in the last 168 hours.  Cardiac Enzymes:  Recent Labs Lab 07/12/16 2235 07/13/16 0528 07/13/16 1213 07/13/16 1646  TROPONINI 1.11* 1.13* 0.93* 0.86*    BNP (last 3 results) No results for input(s): PROBNP in the last 8760 hours.  HbA1C: No results for input(s): HGBA1C in the last 72 hours.  CBG:  Recent Labs Lab 07/13/16 1622 07/13/16 2115 07/14/16 0734 07/14/16 1209 07/14/16 1620  GLUCAP 122* 168* 101* 100* 93  Lipid Profile: No results for input(s): CHOL, HDL, LDLCALC, TRIG, CHOLHDL, LDLDIRECT in the last 72 hours.  Thyroid Function Tests: No results for input(s): TSH, T4TOTAL, FREET4, T3FREE, THYROIDAB in the last 72 hours.  Anemia Panel:  Recent Labs  07/15/16 0457  VITAMINB12 157*  FOLATE 29.4  FERRITIN 31  TIBC 321  IRON 7*  RETICCTPCT 1.6    Urine analysis: No results found for: COLORURINE, APPEARANCEUR, LABSPEC, PHURINE, GLUCOSEU, HGBUR, BILIRUBINUR, KETONESUR, PROTEINUR, UROBILINOGEN, NITRITE, LEUKOCYTESUR  Sepsis Labs: Lactic Acid, Venous    Component Value Date/Time   LATICACIDVEN 0.93 07/12/2016 2243    MICROBIOLOGY: Recent Results (from the past 240 hour(s))  Blood culture (routine x 2)     Status: None (Preliminary result)   Collection Time: 07/12/16 11:53 PM  Result Value Ref Range Status   Specimen Description BLOOD RIGHT ARM  Final   Special Requests   Final    BOTTLES DRAWN AEROBIC AND ANAEROBIC Blood Culture adequate volume   Culture   Final    NO GROWTH < 24 HOURS Performed at [facility], [place] [address], [place],  [place] [address]    Report Status PENDING  Incomplete  Blood culture (routine x 2)     Status: None (Preliminary result)   Collection Time: 07/13/16 12:02 AM  Result Value Ref Range Status   Specimen Description BLOOD RIGHT ARM  Final   Special Requests   Final    BOTTLES DRAWN AEROBIC AND ANAEROBIC Blood Culture adequate volume   Culture   Final    NO GROWTH < 24 HOURS Performed at [facility], [place] [address], [place], [place] [address]    Report Status PENDING  Incomplete    RADIOLOGY STUDIES/RESULTS: Dg Chest 2 View  Result Date: 07/12/2016 CLINICAL DATA:  Respiratory difficulty. Shortness of breath. Previous smoker. History of pneumonia and bronchitis. EXAM: CHEST  2 VIEW COMPARISON:  05/01/2015 FINDINGS: Cardiac pacemaker. Shallow inspiration. Perihilar airspace and interstitial changes bilaterally. This is new since previous study. Peribronchial thickening. Changes likely to represent multifocal pneumonia. Normal heart size. No blunting of costophrenic angles. No pneumothorax. Calcified and tortuous aorta. Old fracture deformity of the right proximal humerus. Inferior vena caval filter. IMPRESSION: Bronchial wall thickening with perihilar interstitial and airspace disease new since previous study. Changes likely to represent multifocal pneumonia. Electronically Signed   By: Lucienne Capers M.D.   On: 07/12/2016 22:55     LOS: 2 days   Oren Binet, MD  [facility] Pager:[phone]  If 7PM-7AM, please contact night-coverage [email or web address] Password [email or web address] 07/15/2016, 1:30 PM

## 2016-07-16 ENCOUNTER — Inpatient Hospital Stay (HOSPITAL_COMMUNITY): Payer: Medicare Other

## 2016-07-16 DIAGNOSIS — J9601 Acute respiratory failure with hypoxia: Secondary | ICD-10-CM

## 2016-07-16 DIAGNOSIS — R55 Syncope and collapse: Secondary | ICD-10-CM

## 2016-07-16 LAB — CBC
HCT: 24.3 % — ABNORMAL LOW (ref 36.0–46.0)
Hemoglobin: 7.2 g/dL — ABNORMAL LOW (ref 12.0–15.0)
MCH: 25.8 pg — AB (ref 26.0–34.0)
MCHC: 29.6 g/dL — ABNORMAL LOW (ref 30.0–36.0)
MCV: 87.1 fL (ref 78.0–100.0)
PLATELETS: 231 10*3/uL (ref 150–400)
RBC: 2.79 MIL/uL — ABNORMAL LOW (ref 3.87–5.11)
RDW: 14.7 % (ref 11.5–15.5)
WBC: 8.1 10*3/uL (ref 4.0–10.5)

## 2016-07-16 LAB — OCCULT BLOOD X 1 CARD TO LAB, STOOL: FECAL OCCULT BLD: NEGATIVE

## 2016-07-16 LAB — PREPARE RBC (CROSSMATCH)

## 2016-07-16 LAB — PROCALCITONIN: Procalcitonin: <0.1 ng/mL

## 2016-07-16 LAB — BRAIN NATRIURETIC PEPTIDE: B Natriuretic Peptide: 1452.2 pg/mL — ABNORMAL HIGH (ref 0.0–100.0)

## 2016-07-16 LAB — ABO/RH: ABO/RH(D): O POS

## 2016-07-16 LAB — HEMOGLOBIN AND HEMATOCRIT, BLOOD
HCT: 29.9 % — ABNORMAL LOW (ref 36.0–46.0)
HEMOGLOBIN: 9.1 g/dL — AB (ref 12.0–15.0)

## 2016-07-16 MED ORDER — DIPHENHYDRAMINE HCL 50 MG/ML IJ SOLN
25.0000 mg | Freq: Once | INTRAMUSCULAR | Status: AC
  Administered 2016-07-16: 25 mg via INTRAVENOUS
  Filled 2016-07-16: qty 1

## 2016-07-16 MED ORDER — PREDNISONE 20 MG PO TABS
40.0000 mg | ORAL_TABLET | Freq: Every day | ORAL | Status: DC
  Administered 2016-07-16: 40 mg via ORAL
  Filled 2016-07-16 (×2): qty 2

## 2016-07-16 MED ORDER — LEVALBUTEROL HCL 0.63 MG/3ML IN NEBU
0.6300 mg | INHALATION_SOLUTION | Freq: Three times a day (TID) | RESPIRATORY_TRACT | Status: DC
  Administered 2016-07-16: 0.63 mg via RESPIRATORY_TRACT
  Filled 2016-07-16 (×2): qty 3

## 2016-07-16 MED ORDER — REGADENOSON 0.4 MG/5ML IV SOLN
INTRAVENOUS | Status: AC
  Filled 2016-07-16: qty 5

## 2016-07-16 MED ORDER — IPRATROPIUM BROMIDE 0.02 % IN SOLN
0.5000 mg | Freq: Three times a day (TID) | RESPIRATORY_TRACT | Status: DC
  Administered 2016-07-17 (×3): 0.5 mg via RESPIRATORY_TRACT
  Filled 2016-07-16 (×3): qty 2.5

## 2016-07-16 MED ORDER — FUROSEMIDE 10 MG/ML IJ SOLN
40.0000 mg | Freq: Once | INTRAMUSCULAR | Status: AC
  Administered 2016-07-16: 40 mg via INTRAVENOUS
  Filled 2016-07-16: qty 4

## 2016-07-16 MED ORDER — SODIUM CHLORIDE 0.9 % IV SOLN
Freq: Once | INTRAVENOUS | Status: AC
  Administered 2016-07-16: 15:00:00 via INTRAVENOUS

## 2016-07-16 MED ORDER — IPRATROPIUM BROMIDE 0.02 % IN SOLN
0.5000 mg | Freq: Three times a day (TID) | RESPIRATORY_TRACT | Status: DC
  Administered 2016-07-16: 0.5 mg via RESPIRATORY_TRACT
  Filled 2016-07-16 (×2): qty 2.5

## 2016-07-16 MED ORDER — AMINOPHYLLINE 25 MG/ML IV SOLN
INTRAVENOUS | Status: AC
  Filled 2016-07-16: qty 10

## 2016-07-16 MED ORDER — REGADENOSON 0.4 MG/5ML IV SOLN
0.4000 mg | Freq: Once | INTRAVENOUS | Status: AC
  Administered 2016-07-16: 0.4 mg via INTRAVENOUS
  Filled 2016-07-16: qty 5

## 2016-07-16 MED ORDER — ACETAMINOPHEN 325 MG PO TABS
650.0000 mg | ORAL_TABLET | Freq: Once | ORAL | Status: AC
  Administered 2016-07-16: 650 mg via ORAL
  Filled 2016-07-16: qty 2

## 2016-07-16 MED ORDER — TECHNETIUM TC 99M TETROFOSMIN IV KIT
10.0000 | PACK | Freq: Once | INTRAVENOUS | Status: AC | PRN
  Administered 2016-07-16: 10 via INTRAVENOUS

## 2016-07-16 MED ORDER — IPRATROPIUM-ALBUTEROL 0.5-2.5 (3) MG/3ML IN SOLN: 3.0000 mL | Freq: Four times a day (QID) | RESPIRATORY_TRACT | Status: DC

## 2016-07-16 MED ORDER — LEVALBUTEROL HCL 0.63 MG/3ML IN NEBU
0.6300 mg | INHALATION_SOLUTION | Freq: Three times a day (TID) | RESPIRATORY_TRACT | Status: DC
  Administered 2016-07-17 (×2): 0.63 mg via RESPIRATORY_TRACT
  Filled 2016-07-16 (×3): qty 3

## 2016-07-16 MED ORDER — FUROSEMIDE 10 MG/ML IJ SOLN
20.0000 mg | Freq: Once | INTRAMUSCULAR | Status: DC
  Filled 2016-07-16: qty 2

## 2016-07-16 MED ORDER — IPRATROPIUM BROMIDE 0.02 % IN SOLN: 0.5000 mg | RESPIRATORY_TRACT | Status: DC

## 2016-07-16 MED ORDER — GUAIFENESIN ER 600 MG PO TB12
1200.0000 mg | ORAL_TABLET | Freq: Two times a day (BID) | ORAL | Status: DC
  Administered 2016-07-16 – 2016-07-20 (×8): 1200 mg via ORAL
  Filled 2016-07-16 (×8): qty 2

## 2016-07-16 NOTE — Progress Notes (Addendum)
Progress Note  Patient Name: Meredith Gonzalez Date of Encounter: 07/16/2016  Primary Cardiologist:  Dr. Ross/ Dr. Lovena Le (EP)  Patient Profile     81 y.o. female w/ hx  HTN, hypothyroidism, chronic anemia, paroxysmal atrial fib, syncope, SDH after a fall, symptomatic bradycardia s/p Biotronik PPM with RV lead replacement, hx of CVA who was transferred to [facility] from [facility] on 07/13/16 for multifocal PNA and elevated troponin.   Subjective   Breathing about the same or a little better than yesterday. No chest pain.  Inpatient Medications    Scheduled Meds: . aminophylline      . regadenoson      . acetaminophen  500 mg Oral Daily  . amitriptyline  25 mg Oral QHS  . aspirin EC  81 mg Oral Daily  . azithromycin  500 mg Oral Daily  . calcium-vitamin D  1 tablet Oral BID WC  . cyanocobalamin  1,000 mcg Subcutaneous Daily  . enoxaparin (LOVENOX) injection  40 mg Subcutaneous Q24H  . ferrous sulfate  325 mg Oral BID WC  . FLUoxetine  60 mg Oral Daily  . gabapentin  300 mg Oral q morning - 10a  . gabapentin  600 mg Oral QHS  . multivitamin with minerals  1 tablet Oral Daily  . pindolol  5 mg Oral BID  . regadenoson  0.4 mg Intravenous Once  . sodium chloride flush  3 mL Intravenous Q12H   Continuous Infusions: . sodium chloride    . cefTRIAXone (ROCEPHIN)  IV Stopped (07/15/16 2249)   PRN Meds: sodium chloride, acetaminophen, diphenhydrAMINE, levalbuterol, LORazepam, sodium chloride flush   Vital Signs    Vitals:   07/15/16 2003 07/15/16 2208 07/16/16 0533 07/16/16 0859  BP: (!) 155/63 (!) 152/70 (!) 131/57 (!) 146/64  Pulse: 80 83 80 76  Resp: 18 18 20    Temp: 99.4 F (37.4 C)  98.9 F (37.2 C)   TempSrc: Oral  Oral   SpO2: 100% 93% 100%   Weight:   122 lb (55.3 kg)   Height:        Intake/Output Summary (Last 24 hours) at 07/16/16 0921 Last data filed at 07/16/16 0547  Gross per 24 hour  Intake              540 ml  Output              400 ml  Net              140  ml   Filed Weights   07/14/16 0626 07/15/16 0540 07/16/16 0533  Weight: 123 lb 4.8 oz (55.9 kg) 122 lb (55.3 kg) 122 lb (55.3 kg)    Telemetry    SR, seen in nuc med - Personally Reviewed  ECG    SR from 04/20 - Personally Reviewed  Physical Exam   General: Well developed, well nourished, female appearing in no acute distress. Head: Normocephalic, atraumatic.  Neck: Supple without bruits, JVD mildly elevated. Lungs:  Resp regular and unlabored, bibasilar rales, wheezy cough. Heart: RRR, S1, S2, no S3, S4, or murmur; no rub. Abdomen: Soft, non-tender, non-distended with normoactive bowel sounds. No hepatomegaly. No rebound/guarding. No obvious abdominal masses. Extremities: No clubbing, cyanosis, no edema. Distal pedal pulses are 2+ bilaterally. Neuro: Alert and oriented X 3. Moves all extremities spontaneously. Psych: Normal affect.  Labs    Hematology Recent Labs Lab 07/14/16 0440 07/15/16 0457 07/16/16 0204  WBC 7.4 7.5 8.1  RBC 2.84* 2.95*  2.95* 2.79*  HGB 7.4* 7.6* 7.2*  HCT 24.9* 25.8* 24.3*  MCV 87.7 87.5 87.1  MCH 26.1 25.8* 25.8*  MCHC 29.7* 29.5* 29.6*  RDW 15.2 14.9 14.7  PLT 209 233 231    Chemistry Recent Labs Lab 07/12/16 2235 07/14/16 0440 07/15/16 0457  NA 138 138 136  K 4.3 4.0 4.0  CL 102 102 101  CO2 28 28 28   GLUCOSE 171* 98 102*  BUN 19 17 13   CREATININE 0.65 0.65 0.58  CALCIUM 8.7* 8.3* 8.4*  GFRNONAA >60 >60 >60  GFRAA >60 >60 >60  ANIONGAP 8 8 7      Cardiac Enzymes Recent Labs Lab 07/12/16 2235 07/13/16 0528 07/13/16 1213 07/13/16 1646  TROPONINI 1.11* 1.13* 0.93* 0.86*    Radiology    Dg Chest 2 View  Result Date: 07/12/2016 CLINICAL DATA:  Respiratory difficulty. Shortness of breath. Previous smoker. History of pneumonia and bronchitis. EXAM: CHEST  2 VIEW COMPARISON:  05/01/2015 FINDINGS: Cardiac pacemaker. Shallow inspiration. Perihilar airspace and interstitial changes bilaterally. This is new since previous  study. Peribronchial thickening. Changes likely to represent multifocal pneumonia. Normal heart size. No blunting of costophrenic angles. No pneumothorax. Calcified and tortuous aorta. Old fracture deformity of the right proximal humerus. Inferior vena caval filter. IMPRESSION: Bronchial wall thickening with perihilar interstitial and airspace disease new since previous study. Changes likely to represent multifocal pneumonia. Electronically Signed   By: Lucienne Capers M.D.   On: 07/12/2016 22:55     Cardiac Studies   ECHO: 07/14/2016 - Left ventricle: There is possible apical and apical   inferolateral, apical anterior and apical lateral HK but   endocardial segments poorly visualized. recommend limited echo   with definity contrast. The cavity size was mildly dilated.   Systolic function was normal. Features are consistent with a   pseudonormal left ventricular filling pattern, with concomitant   abnormal relaxation and increased filling pressure (grade 2   diastolic dysfunction). Doppler parameters are consistent with   high ventricular filling pressure. - Mitral valve: There was trivial regurgitation. - Left atrium: The atrium was mildly dilated. Anterior-posterior   dimension: 41 mm. - Pulmonary arteries: PA peak pressure: 50 mm Hg (S). - Recommendations: Recommend limite echo with definity contrast to   assess wall motion. Impressions: - The right ventricular systolic pressure was increased consistent   with moderate pulmonary hypertension. Recommendations:  Recommend limite echo with definity contrast to assess wall motion.  Telemetry: SR, personally reviewed   Patient Profile     81 y.o. female w/ hx  HTN, hypothyroidism, chronic anemia, paroxysmal atrial fib, syncope, SDH after a fall, symptomatic bradycardia s/p Biotronick PPM with RV lead replacement, hx of CVA who was transferred to [facility] from [facility] on 07/13/16 for multifocal PNA and elevated troponin.    Assessment &  Plan    Elevated troponin: troponin 1.11--> 1.13--> 0.93-->  0.86. She did have some chest pain last week that was pleuritic and non exertional. She has not had any chest pain recurrence since that time or during this admission. Could also be demand ischemia from acute PNA. 2D ECHO this admission shows possible apical WMA but not an adequate study (limited echo with definity ordered to assess wall motion.)  - She will need some sort of ischemic work up. Nuc Stress testing when pt resp status improved, as an inpt or as an outpatient. Will defer to Dr. Meda Coffee.  CAP: continue abx per IM - O2 sats 88% this am, add O2 - some wheezing, will  give 1 neb, otherwise, per IM  Anemia: she has a long history of anemia. Hg dropped during admission 9.4--> 7.4--> 7.6--> 7.2. Could be from hemodilution from IVFs. Continue to monitor - per IM  HTN: BP has been well controlled   PAF: maintaining NSR. Not on [facility] given history of recurrent falls, syncope and SDH. CHADSVASC of at least 6 (HTN, age, F sex, CVA). Continue pindolol   Symptomatic bradycardia s/p PPM: followed by Dr. Lovena Le. Last interrogated 07/01/16 and device functioning well with good battery life.   Hx of CVA: continue asa   Pulmonary HTN: PA pressure 50 on echo this admission.   Otherwise, per IM Principal Problem:   Multifocal pneumonia Active Problems:   Recurrent falls   Syncope   SDH (subdural hematoma) (HCC)   Anemia   Depression   Hypertension   Diabetes ([place])   Pacemaker   Elevated troponin   Leukocytosis  Signed, Lenoard Aden 9:21 AM 07/16/2016 Pager: [phone]  The patient was seen, examined and discussed with Rosaria Ferries, PA-C and I agree with the above.   Jeanett Antonopoulos is a 81 y.o. female with a history of HTN, hypothyroidism, chronic anemia, paroxysmal atrial fib, syncope, SDH after a fall, symptomatic bradycardia s/p PPM with RV lead replacement, hx of CVA who was transferred to [facility] from [facility]  on 07/13/16 for multifocal PNA and elevated troponin. The patient had a LHC in 1995.No CAD withLVEF 55%.  She had a nuclear stress test back in 2013 with no ischemia. Xarelto was discontinued given hx of syncope, recurrent falls and subdural hematoma. She was last seen by Dr. Harrington Challenger and Dr Lovena Le in 5-08/2015 and doing well.  She was in her usual state of health until 07/12/16 when she presented to [facility] with SOB. Her troponin was noted to be elevated to 1.1. CXR showed multifocal pneumonia and leukocytosis WBC 11.1, Hbg 9.4,lactic acid 0.9.Cardiology was consulted but recommended trending troponins as symptoms could be secondary to pneumonia. Cultures wereobtained patient was started on antibiotics of Rocephin and azithromycin.  She admits to exertional dyspnea and CP for the last 2-3 months, no resting chest pain. ECG shows paced rhythm. Labs show normal Crea, Hb 7.6 that might be contributing to her symptoms. Consider GI workup, she denies bleeding or melena.  We will plan for a Lexiscan nuclear stress test for tomorrow.  She presented for a stress test today but was actively wheezing with O2 sats 88%, we started her on 2 L O2 via [place] and albuterol/atrovent nebulizer as well as short course of prednisone 40 mg po daily x 3 days. We will reevaluate tomorrow. Overall she feels slightly better.   Ena Dawley, MD 07/16/2016

## 2016-07-16 NOTE — Progress Notes (Signed)
PROGRESS NOTE        PATIENT DETAILS Name: Meredith Gonzalez Age: 81 y.o. y.o. Sex: female Date of Birth: [birth year] Admit Date: 07/12/2016 Admitting Physician Vianne Bulls, MD ZOX:[id number], Cecille Rubin, MD  Brief Narrative: Patient is a 81 y.o. female with history of CAD status post CABG, hypertension, depression, prior history of CVA with no major residual effects presented to the hospital with worsening shortness of breath, found to have multifocal pneumonia and admitted to the hospitalist service for further evaluation and treatment. Please see below for further details  Subjective: Develop shortness of breath earlier this morning-chest x-ray done earlier this morning showed significant worsening of infiltrates.  Denies any hematochezia or melena.  Telemetry (personally reviewed)-sinus rhythm.  Assessment/Plan: Acute hypoxemic respiratory failure: Significant worsening of her lung infiltrates on chest x-ray done his morning (personally reviewed). Remains on 2-3 L of oxygen. She however appears comfortable. Continue current antimicrobial therapy-and bronchodilators, Mucinex. Checking respiratory virus panel, pro-calcitonin level.  Community acquired pneumonia: Was slowly improving-but this morning he developed shortness of breath, she was found to have worsening of infiltrates. She however does not have any fever or leukocytosis. Cultures continue to be negative. Will check procalcitonin level, respiratory virus panel and continue current antimicrobial therapy for now. If she worsens, we can then consider broadening antimicrobial coverage.   Elevated troponins: Likely secondary to demand ischemia-trend is flat and not consistent with ACS-have consulted cardiology. Cardiac the planning on nuclear stress test when more stable, awaiting repeat limited echo.  Chronic normocytic Anemia: Has history of chronic normocytic anemia-and vitamin B12 deficiency. Apparently on vitamin B12  supplementation as an outpatient. Rectal exam done this morning by this M.D.-brown stools-FOBT negative. Her reticulocyte count is not elevated-guarding against blood loss, probably anemia is related to acute illness-on a background of chronic vitamin B12/iron deficiency. Since she is short of breath-and had chest pain on a few days prior to this admission with elevated troponins--we will give her 1 unit of PRBC.Since FOBT is negative-and on anemia appears chronic-I doubt GI evaluation is indicated while she is inpatient  History of CAD: Continue aspirin and beta blocker-see above regarding positive troponins.   History of hypertension: Controlled, continue atenolol.   Depression: Appears to be stable, continue Elavil and Prozac.  History of atrial fibrillation: Not a anticoagulation candidate per outpatient cardiology note (personally reviewed  History of symptomatic bradycardia with syncope-status post permanent pacemaker implantation  DVT Prophylaxis: Prophylactic Lovenox   Code Status: Full code   Family Communication: None at bedside  Disposition Plan: Remain inpatient-home when more clinically improved, suspect requires a few more days inpatient stay before discharge.  Antimicrobial agents: Anti-infectives    Start     Dose/Rate Route Frequency Ordered Stop   07/14/16 2100  azithromycin (ZITHROMAX) tablet 500 mg     500 mg Oral Daily 07/14/16 1002 07/19/16 2059   07/13/16 2200  cefTRIAXone (ROCEPHIN) 1 g in dextrose 5 % 50 mL IVPB     1 g 100 mL/hr over 30 Minutes Intravenous Every 24 hours 07/13/16 0407 07/19/16 2159   07/13/16 2100  azithromycin (ZITHROMAX) 500 mg in dextrose 5 % 250 mL IVPB  Status:  Discontinued     500 mg 250 mL/hr over 60 Minutes Intravenous Every 24 hours 07/13/16 0407 07/14/16 1002   07/12/16 2319  azithromycin (ZITHROMAX) 500 MG injection    Comments:  Davis, Misty   : cabinet override      07/12/16 2319 07/13/16 1129   07/12/16 2315  cefTRIAXone  (ROCEPHIN) 1 g in dextrose 5 % 50 mL IVPB     1 g 100 mL/hr over 30 Minutes Intravenous  Once 07/12/16 2300 07/13/16 0101   07/12/16 2315  azithromycin (ZITHROMAX) 500 mg in dextrose 5 % 250 mL IVPB     500 mg 250 mL/hr over 60 Minutes Intravenous  Once 07/12/16 2300 07/13/16 0204      Procedures: None  CONSULTS:  None  Time spent: 25- minutes-Greater than 50% of this time was spent in counseling, explanation of diagnosis, planning of further management, and coordination of care.  MEDICATIONS: Scheduled Meds: . acetaminophen  500 mg Oral Daily  . acetaminophen  650 mg Oral Once  . amitriptyline  25 mg Oral QHS  . aspirin EC  81 mg Oral Daily  . azithromycin  500 mg Oral Daily  . calcium-vitamin D  1 tablet Oral BID WC  . cyanocobalamin  1,000 mcg Subcutaneous Daily  . diphenhydrAMINE  25 mg Intravenous Once  . enoxaparin (LOVENOX) injection  40 mg Subcutaneous Q24H  . ferrous sulfate  325 mg Oral BID WC  . FLUoxetine  60 mg Oral Daily  . furosemide  20 mg Intravenous Once  . gabapentin  300 mg Oral q morning - 10a  . gabapentin  600 mg Oral QHS  . ipratropium-albuterol  3 mL Nebulization Q6H  . multivitamin with minerals  1 tablet Oral Daily  . pindolol  5 mg Oral BID  . predniSONE  40 mg Oral Daily  . regadenoson  0.4 mg Intravenous Once  . sodium chloride flush  3 mL Intravenous Q12H   Continuous Infusions: . sodium chloride    . sodium chloride    . cefTRIAXone (ROCEPHIN)  IV Stopped (07/15/16 2249)   PRN Meds:.sodium chloride, acetaminophen, diphenhydrAMINE, levalbuterol, LORazepam, sodium chloride flush   PHYSICAL EXAM: Vital signs: Vitals:   07/15/16 2208 07/16/16 0533 07/16/16 0859 07/16/16 1158  BP: (!) 152/70 (!) 131/57 (!) 146/64 (!) 141/49  Pulse: 83 80 76 76  Resp: 18 20  18   Temp:  98.9 F (37.2 C)  98.7 F (37.1 C)  TempSrc:  Oral  Oral  SpO2: 93% 100%  100%  Weight:  55.3 kg (122 lb)    Height:       Filed Weights   07/14/16 0626  07/15/16 0540 07/16/16 0533  Weight: 55.9 kg (123 lb 4.8 oz) 55.3 kg (122 lb) 55.3 kg (122 lb)   Body mass index is 19.69 kg/m.   General appearance :Awake, alert, not in any distress. Looks very frail. Eyes:, pupils equally reactive to light and accomodation,no scleral icterus. HEENT: Atraumatic and Normocephalic Neck: supple, no JVD. Resp:Good air entry bilaterally-Has bibasilar rales and scattered rhonchi CVS: S1 S2 regular GI: Bowel sounds present, Non tender and not distended with no gaurding, rigidity or rebound. Extremities: B/L Lower Ext shows no edema, both legs are warm to touch Neurology:  speech clear,Non focal, sensation is grossly intact. Psychiatric: Normal judgment and insight. Normal mood. Musculoskeletal:No digital cyanosis Skin:No Rash, warm and dry Wounds:N/A  I have personally reviewed following labs and imaging studies  LABORATORY DATA: CBC:  Recent Labs Lab 07/12/16 2235 07/14/16 0440 07/15/16 0457 07/16/16 0204  WBC 11.1* 7.4 7.5 8.1  HGB 9.4* 7.4* 7.6* 7.2*  HCT 31.5* 24.9* 25.8* 24.3*  MCV 89.7 87.7 87.5 87.1  PLT 266 209  233 409    Basic Metabolic Panel:  Recent Labs Lab 07/12/16 2235 07/14/16 0440 07/15/16 0457  NA 138 138 136  K 4.3 4.0 4.0  CL 102 102 101  CO2 28 28 28   GLUCOSE 171* 98 102*  BUN 19 17 13   CREATININE 0.65 0.65 0.58  CALCIUM 8.7* 8.3* 8.4*    GFR: Estimated Creatinine Clearance: 50.6 mL/min (by C-G formula based on SCr of 0.58 mg/dL).  Liver Function Tests: No results for input(s): AST, ALT, ALKPHOS, BILITOT, PROT, ALBUMIN in the last 168 hours. No results for input(s): LIPASE, AMYLASE in the last 168 hours. No results for input(s): AMMONIA in the last 168 hours.  Coagulation Profile: No results for input(s): INR, PROTIME in the last 168 hours.  Cardiac Enzymes:  Recent Labs Lab 07/12/16 2235 07/13/16 0528 07/13/16 1213 07/13/16 1646  TROPONINI 1.11* 1.13* 0.93* 0.86*    BNP (last 3 results) No  results for input(s): PROBNP in the last 8760 hours.  HbA1C: No results for input(s): HGBA1C in the last 72 hours.  CBG:  Recent Labs Lab 07/13/16 1622 07/13/16 2115 07/14/16 0734 07/14/16 1209 07/14/16 1620  GLUCAP 122* 168* 101* 100* 93    Lipid Profile: No results for input(s): CHOL, HDL, LDLCALC, TRIG, CHOLHDL, LDLDIRECT in the last 72 hours.  Thyroid Function Tests: No results for input(s): TSH, T4TOTAL, FREET4, T3FREE, THYROIDAB in the last 72 hours.  Anemia Panel:  Recent Labs  07/15/16 0457  VITAMINB12 157*  FOLATE 29.4  FERRITIN 31  TIBC 321  IRON 7*  RETICCTPCT 1.6    Urine analysis: No results found for: COLORURINE, APPEARANCEUR, LABSPEC, PHURINE, GLUCOSEU, HGBUR, BILIRUBINUR, KETONESUR, PROTEINUR, UROBILINOGEN, NITRITE, LEUKOCYTESUR  Sepsis Labs: Lactic Acid, Venous    Component Value Date/Time   LATICACIDVEN 0.93 07/12/2016 2243    MICROBIOLOGY: Recent Results (from the past 240 hour(s))  Blood culture (routine x 2)     Status: None (Preliminary result)   Collection Time: 07/12/16 11:53 PM  Result Value Ref Range Status   Specimen Description BLOOD RIGHT ARM  Final   Special Requests   Final    BOTTLES DRAWN AEROBIC AND ANAEROBIC Blood Culture adequate volume   Culture   Final    NO GROWTH 3 DAYS Performed at [facility], [place] [address], [place], [place] [address]    Report Status PENDING  Incomplete  Blood culture (routine x 2)     Status: None (Preliminary result)   Collection Time: 07/13/16 12:02 AM  Result Value Ref Range Status   Specimen Description BLOOD RIGHT ARM  Final   Special Requests   Final    BOTTLES DRAWN AEROBIC AND ANAEROBIC Blood Culture adequate volume   Culture   Final    NO GROWTH 3 DAYS Performed at [facility], [address] [place], [place] [address]    Report Status PENDING  Incomplete    RADIOLOGY STUDIES/RESULTS: Dg Chest 2 View  Result Date: 07/12/2016 CLINICAL DATA:  Respiratory  difficulty. Shortness of breath. Previous smoker. History of pneumonia and bronchitis. EXAM: CHEST  2 VIEW COMPARISON:  05/01/2015 FINDINGS: Cardiac pacemaker. Shallow inspiration. Perihilar airspace and interstitial changes bilaterally. This is new since previous study. Peribronchial thickening. Changes likely to represent multifocal pneumonia. Normal heart size. No blunting of costophrenic angles. No pneumothorax. Calcified and tortuous aorta. Old fracture deformity of the right proximal humerus. Inferior vena caval filter. IMPRESSION: Bronchial wall thickening with perihilar interstitial and airspace disease new since previous study. Changes  likely to represent multifocal pneumonia. Electronically Signed   By: Lucienne Capers M.D.   On: 07/12/2016 22:55   Dg Chest Port 1v Same Day  Result Date: 07/16/2016 CLINICAL DATA:  Shortness of breath EXAM: PORTABLE CHEST 1 VIEW COMPARISON:  Chest x-ray of 07/12/2016 FINDINGS: There has been significant worsening of patchy airspace disease throughout the lungs right greater than left most consistent with multifocal pneumonia. No pleural effusion is noted. Cardiomegaly is stable and pacer wires remain. The bones are osteopenic. IMPRESSION: Significant worsening of patchy airspace disease bilaterally most consistent with multifocal pneumonia. Electronically Signed   By: Ivar Drape M.D.   On: 07/16/2016 10:57     LOS: 3 days   Oren Binet, MD  [facility] Pager:[phone]  If 7PM-7AM, please contact night-coverage [email or web address] Password [email or web address] 07/16/2016, 1:32 PM

## 2016-07-16 NOTE — Evaluation (Signed)
Clinical/Bedside Swallow Evaluation Patient Details  Name: Meredith Gonzalez MRN: [id number] Date of Birth: 1935/09/13  Today's Date: 07/16/2016 Time: SLP Start Time (ACUTE ONLY): 85 SLP Stop Time (ACUTE ONLY): 1529 SLP Time Calculation (min) (ACUTE ONLY): 19 min  Past Medical History:  Past Medical History:  Diagnosis Date  . Anemia   . Arthritis   . Diabetes mellitus without complication ([place])   . Fibromyalgia   . Fracture of right humerus   . Generalized weakness   . Hypothyroidism   . Incontinence of urine   . Major depression, chronic   . Neuropathy   . Pacemaker   . Recurrent falls   . Stroke ([place]) 2000  . Syncope and collapse    Past Surgical History:  Past Surgical History:  Procedure Laterality Date  . ABDOMINAL HYSTERECTOMY    . basal cell cancer removal    . CARPAL TUNNEL RELEASE     bilateral  . CHOLECYSTECTOMY    . CHOLECYSTECTOMY    . COLONOSCOPY    . EYE SURGERY     right eye catarace/lens implant  . FEMUR FRACTURE SURGERY     bilateral  . GASTRIC BYPASS    . LEAD REVISION N/A 01/01/2013   Procedure: LEAD REVISION;  Surgeon: Evans Lance, MD;  Location: [facility] CATH LAB;  Service: Cardiovascular;  Laterality: N/A;  . PACEMAKER INSERTION    . right ankle surgery     HPI:  81 y.o. female with medical history significant for coronary artery disease status post CABG, hypertension, depression, chronic normocytic anemia, and history of CVA who presents to [place] ED for evaluation shortness of breath. Patient reported that she was having some chest pain several days ago, but was not evaluated for it and it eventually resolved. She returned to her normal state and was doing well until earlier on the day of her presentation when she developed shortness of breath on 07/12/16.  She denied any significant cough, but noted dyspnea, initially with exertion, and later even at rest.   Assessment / Plan / Recommendation Clinical Impression   Pt without  overt s/s of aspiration noted during BSE with all consistencies assessed including thin-solids; normal oropharyngeal swallow noted; no f/u recommended; Regular/thin liquid diet continue. SLP Visit Diagnosis: Dysphagia, unspecified (R13.10)    Aspiration Risk  No limitations    Diet Recommendation   Regular (heart healthy)/thin liquids  Medication Administration: Whole meds with liquid    Other  Recommendations Oral Care Recommendations: Oral care BID   Follow up Recommendations None      Frequency and Duration   n/a         Prognosis Prognosis for Safe Diet Advancement: Good      Swallow Study   General Date of Onset: 07/12/16 HPI: 81 y.o. female with medical history significant for coronary artery disease status post CABG, hypertension, depression, chronic normocytic anemia, and history of CVA who presents to [place] ED for evaluation shortness of breath. Patient reported that she was having some chest pain several days ago, but was not evaluated for it and it eventually resolved. She returned to her normal state and was doing well until earlier on the day of her presentation when she developed shortness of breath. She denied any significant cough, but noted dyspnea, initially with exertion, and later even at rest. Type of Study: Bedside Swallow Evaluation Diet Prior to this Study: Regular;Thin liquids Temperature Spikes Noted: No Respiratory Status: Nasal cannula History of Recent  Intubation: No Behavior/Cognition: Alert;Cooperative;Pleasant mood Oral Cavity Assessment: Within Functional Limits Oral Care Completed by SLP: No Oral Cavity - Dentition: Dentures, top;Dentures, bottom Vision: Functional for self-feeding Self-Feeding Abilities: Able to feed self Patient Positioning: Upright in bed Baseline Vocal Quality: Normal Volitional Cough: Strong Volitional Swallow: Able to elicit    Oral/Motor/Sensory Function Overall Oral Motor/Sensory Function: Within  functional limits   Ice Chips Ice chips: Within functional limits Presentation: Spoon   Thin Liquid Thin Liquid: Within functional limits Presentation: Cup;Straw    Nectar Thick Nectar Thick Liquid: Not tested   Honey Thick Honey Thick Liquid: Not tested   Puree Puree: Within functional limits Presentation: Self Fed   Solid      Solid: Within functional limits Presentation: Self Fed    Functional Assessment Tool Used: NOMS; clinical judgment Functional Limitations: Swallowing Swallow Current Status (V0350): 0 percent impaired, limited or restricted Swallow Goal Status (K9381): 0 percent impaired, limited or restricted Swallow Discharge Status [phone]): 0 percent impaired, limited or restricted   Elvina Sidle, M.S., CCC-SLP 07/16/2016,3:45 PM

## 2016-07-16 NOTE — Progress Notes (Signed)
Patient with no complaints or concerns during 7pm - 7am shift. Patient has been NPO after midnight.   Will continue to monitor.   Katelen Luepke, RN

## 2016-07-17 ENCOUNTER — Inpatient Hospital Stay (HOSPITAL_COMMUNITY): Payer: Medicare Other

## 2016-07-17 DIAGNOSIS — R0600 Dyspnea, unspecified: Secondary | ICD-10-CM

## 2016-07-17 DIAGNOSIS — I5031 Acute diastolic (congestive) heart failure: Secondary | ICD-10-CM

## 2016-07-17 LAB — BPAM RBC
BLOOD PRODUCT EXPIRATION DATE: 201805112359
ISSUE DATE / TIME: 201804241446
UNIT TYPE AND RH: 5100

## 2016-07-17 LAB — RESPIRATORY PANEL BY PCR
ADENOVIRUS-RVPPCR: NOT DETECTED
Bordetella pertussis: NOT DETECTED
CORONAVIRUS NL63-RVPPCR: NOT DETECTED
Chlamydophila pneumoniae: NOT DETECTED
Coronavirus 229E: NOT DETECTED
Coronavirus HKU1: NOT DETECTED
Coronavirus OC43: NOT DETECTED
Influenza A: NOT DETECTED
Influenza B: NOT DETECTED
METAPNEUMOVIRUS-RVPPCR: NOT DETECTED
Mycoplasma pneumoniae: NOT DETECTED
PARAINFLUENZA VIRUS 2-RVPPCR: NOT DETECTED
PARAINFLUENZA VIRUS 3-RVPPCR: NOT DETECTED
Parainfluenza Virus 1: NOT DETECTED
Parainfluenza Virus 4: NOT DETECTED
RHINOVIRUS / ENTEROVIRUS - RVPPCR: NOT DETECTED
Respiratory Syncytial Virus: NOT DETECTED

## 2016-07-17 LAB — BASIC METABOLIC PANEL
Anion gap: 9 (ref 5–15)
BUN: 21 mg/dL — AB (ref 6–20)
CHLORIDE: 101 mmol/L (ref 101–111)
CO2: 26 mmol/L (ref 22–32)
Calcium: 8.3 mg/dL — ABNORMAL LOW (ref 8.9–10.3)
Creatinine, Ser: 0.69 mg/dL (ref 0.44–1.00)
GFR calc Af Amer: >60 mL/min (ref >60)
GFR calc non Af Amer: >60 mL/min (ref >60)
GLUCOSE: 233 mg/dL — AB (ref 65–99)
POTASSIUM: 4.6 mmol/L (ref 3.5–5.1)
Sodium: 136 mmol/L (ref 135–145)

## 2016-07-17 LAB — TYPE AND SCREEN
ABO/RH(D): O POS
Antibody Screen: NEGATIVE
UNIT DIVISION: 0

## 2016-07-17 LAB — CBC
HCT: 29.7 % — ABNORMAL LOW (ref 36.0–46.0)
HEMOGLOBIN: 9.3 g/dL — AB (ref 12.0–15.0)
MCH: 27.2 pg (ref 26.0–34.0)
MCHC: 31.3 g/dL (ref 30.0–36.0)
MCV: 86.8 fL (ref 78.0–100.0)
PLATELETS: 240 10*3/uL (ref 150–400)
RBC: 3.42 MIL/uL — AB (ref 3.87–5.11)
RDW: 14.5 % (ref 11.5–15.5)
WBC: 6.6 10*3/uL (ref 4.0–10.5)

## 2016-07-17 LAB — ECHOCARDIOGRAM LIMITED
Height: 66 in
Weight: 1928 oz

## 2016-07-17 MED ORDER — IPRATROPIUM BROMIDE 0.02 % IN SOLN: 0.5000 mg | RESPIRATORY_TRACT | Status: DC | PRN

## 2016-07-17 MED ORDER — PERFLUTREN LIPID MICROSPHERE
1.0000 mL | INTRAVENOUS | Status: AC | PRN
  Filled 2016-07-17: qty 10

## 2016-07-17 MED ORDER — FUROSEMIDE 10 MG/ML IJ SOLN
40.0000 mg | Freq: Once | INTRAMUSCULAR | Status: AC
Start: 2016-07-17 — End: 2016-07-17
  Administered 2016-07-17: 40 mg via INTRAVENOUS
  Filled 2016-07-17: qty 4

## 2016-07-17 MED ORDER — PREDNISONE 20 MG PO TABS
30.0000 mg | ORAL_TABLET | Freq: Every day | ORAL | Status: AC
  Administered 2016-07-17 – 2016-07-18 (×2): 30 mg via ORAL
  Filled 2016-07-17: qty 1

## 2016-07-17 NOTE — Care Management Note (Signed)
Case Management Note  Patient Details  Name: Meredith Gonzalez MRN: [id number] Date of Birth: 15-Jun-1935  Subjective/Objective:    Admitted with Pneumonia               Action/Plan: Patient lives alone, she has a son the lives in [place] that assist her as needed; ZJI:RCVEL, Cecille Rubin, MD; has private insurance with Medicare; pharmacy of choice is [facility]; DME- cane, walker and rollater at home; Physical Therapy recommends HHPT at discharge; patient refused all [place] at this time. If she changed her mind after discharge, her PCP can make [place] arrangements from the office. CM will continue to follow for DCP  Expected Discharge Date:    possibly 07/18/2016              Expected Discharge Plan:  [place]  Discharge planning Services  CM Consult  Choice offered to:  Patient  HH Arranged:  Patient Refused HH  Status of Service:  In process, will continue to follow  Sherrilyn Rist [phone] 07/17/2016, 10:14 AM

## 2016-07-17 NOTE — Progress Notes (Signed)
TRH MD and cardiology MD please contact son, Anaysia Germer at [phone], he would like updates from the doctors on his mother. He states he doesn't trust that she remembers everything that you have told her. Son made aware stress test planned for Thursday.  Thanks nursing

## 2016-07-17 NOTE — Progress Notes (Signed)
Patient used oxygen on 2L via nasal cannula as needed, oxygen saturation WNL when oxygen is off (at rest). Patient stable last night, has questions about completing stress test this morning.

## 2016-07-17 NOTE — Progress Notes (Signed)
Physical Therapy Treatment Patient Details Name: Meredith Gonzalez MRN: [id number] DOB: 24-Jun-1935 Today's Date: 07/17/2016    History of Present Illness Pt is a 81 y/o female admitted secondary to CAP. PMH including but not limited to CAD s/p CABG, HTN, CVA, DM and pacemaker insertion.    PT Comments    Patient was much more steady this session with use of AD for ambulation. Pt with gradually improved activity tolerance and 2/4 DOE with ambulation and SpO2 down to 88% on RA with mobility. Continue to progress as tolerated with anticipated d/c home with HHPT.   Follow Up Recommendations  Home health PT;Supervision - Intermittent     Equipment Recommendations  None recommended by PT (pt has RW and rollator)    Recommendations for Other Services       Precautions / Restrictions Precautions Precautions: Fall Restrictions Weight Bearing Restrictions: No    Mobility  Bed Mobility Overal bed mobility: Needs Assistance Bed Mobility: Supine to Sit     Supine to sit: HOB elevated;Modified independent (Device/Increase time)     General bed mobility comments: increased time  Transfers Overall transfer level: Needs assistance Equipment used: Rolling walker (2 wheeled) Transfers: Sit to/from Stand Sit to Stand: Min guard         General transfer comment: min guard for safety; use of RW upon standing for balance  Ambulation/Gait Ambulation/Gait assistance: Min guard Ambulation Distance (Feet): 120 Feet Assistive device: Rolling walker (2 wheeled) Gait Pattern/deviations: Step-through pattern;Decreased stride length Gait velocity: decreased   General Gait Details: pt with improved balance and cadence with use of AD; pt with safe use of RW; cues for self monitoring for need of rest break; SpO2 88% on RA while ambulating   Stairs            Wheelchair Mobility    Modified Rankin (Stroke Patients Only)       Balance Overall balance assessment: Needs  assistance;History of Falls Sitting-balance support: Feet supported Sitting balance-Leahy Scale: Good     Standing balance support: During functional activity;Single extremity supported Standing balance-Leahy Scale: Fair Standing balance comment: pt able to static stand at sink and brush hair                            Cognition Arousal/Alertness: Awake/alert Behavior During Therapy: WFL for tasks assessed/performed Overall Cognitive Status: Within Functional Limits for tasks assessed                                        Exercises      General Comments        Pertinent Vitals/Pain Pain Assessment: No/denies pain    Home Living                      Prior Function            PT Goals (current goals can now be found in the care plan section) Acute Rehab PT Goals Patient Stated Goal: return home PT Goal Formulation: With patient Time For Goal Achievement: 07/27/16 Potential to Achieve Goals: Good Progress towards PT goals: Progressing toward goals    Frequency    Min 3X/week      PT Plan Current plan remains appropriate    Co-evaluation             End of Session Equipment  Utilized During Treatment: Gait belt Activity Tolerance: Patient tolerated treatment well Patient left: in chair;with call bell/phone within reach;with chair alarm set;with nursing/sitter in room Nurse Communication: Mobility status PT Visit Diagnosis: Unsteadiness on feet (R26.81);History of falling (Z91.81)     Time: 9198-0221 PT Time Calculation (min) (ACUTE ONLY): 22 min  Charges:  $Gait Training: 8-22 mins                    G Codes:       Earney Navy, PTA Pager: [phone]     Darliss Cheney 07/17/2016, 9:47 AM

## 2016-07-17 NOTE — Progress Notes (Signed)
Progress Note  Patient Name: Meredith Gonzalez Date of Encounter: 07/17/2016  Primary Cardiologist: Dr. Ross/ Dr. Lovena Le (EP)  Subjective   SOB generally improved today but kicked back up again after walking with PT. No chest pain. No edema.  Inpatient Medications    Scheduled Meds: . acetaminophen  500 mg Oral Daily  . amitriptyline  25 mg Oral QHS  . aspirin EC  81 mg Oral Daily  . azithromycin  500 mg Oral Daily  . calcium-vitamin D  1 tablet Oral BID WC  . cyanocobalamin  1,000 mcg Subcutaneous Daily  . enoxaparin (LOVENOX) injection  40 mg Subcutaneous Q24H  . ferrous sulfate  325 mg Oral BID WC  . FLUoxetine  60 mg Oral Daily  . gabapentin  300 mg Oral q morning - 10a  . gabapentin  600 mg Oral QHS  . guaiFENesin  1,200 mg Oral BID  . levalbuterol  0.63 mg Nebulization TID   Or  . ipratropium  0.5 mg Nebulization TID  . multivitamin with minerals  1 tablet Oral Daily  . pindolol  5 mg Oral BID  . predniSONE  30 mg Oral Daily  . regadenoson  0.4 mg Intravenous Once  . sodium chloride flush  3 mL Intravenous Q12H   Continuous Infusions: . sodium chloride    . cefTRIAXone (ROCEPHIN)  IV Stopped (07/17/16 0019)   PRN Meds: sodium chloride, acetaminophen, diphenhydrAMINE, levalbuterol, LORazepam, sodium chloride flush   Vital Signs    Vitals:   07/16/16 2113 07/16/16 2346 07/17/16 0627 07/17/16 0845  BP: 136/63 (!) 141/66 (!) 123/59   Pulse: 66 79 71   Resp: 18  18   Temp: 98.1 F (36.7 C)  97.8 F (36.6 C)   TempSrc: Oral  Oral   SpO2: 100%  100% 94%  Weight:   120 lb 8 oz (54.7 kg)   Height:        Intake/Output Summary (Last 24 hours) at 07/17/16 0943 Last data filed at 07/17/16 0600  Gross per 24 hour  Intake             1138 ml  Output                0 ml  Net             1138 ml   Filed Weights   07/15/16 0540 07/16/16 0533 07/17/16 0627  Weight: 122 lb (55.3 kg) 122 lb (55.3 kg) 120 lb 8 oz (54.7 kg)    Telemetry    NSR, atrial paced  intermittently, brief run of SVT - Personally Reviewed  Physical Exam   GEN: No acute distress.  HEENT: Normocephalic, atraumatic, sclera non-icteric. Neck: No JVD or bruits. Cardiac: RRR no murmurs, rubs, or gallops.  Radials/DP/PT 1+ and equal bilaterally.  Respiratory: Crackles at bases, faint rare exp wheezing. Breathing initially labored after walking now improving at rest. GI: Soft, nontender, non-distended, BS +x 4. MS: no deformity. Extremities: No clubbing or cyanosis. No edema. Distal pedal pulses are 2+ and equal bilaterally. Neuro:  AAOx3. Follows commands. Psych:  Responds to questions appropriately with a normal affect.  Labs    Chemistry Recent Labs Lab 07/14/16 0440 07/15/16 0457 07/17/16 0454  NA 138 136 136  K 4.0 4.0 4.6  CL 102 101 101  CO2 28 28 26   GLUCOSE 98 102* 233*  BUN 17 13 21*  CREATININE 0.65 0.58 0.69  CALCIUM 8.3* 8.4* 8.3*  GFRNONAA >60 >60 >60  GFRAA >60 >60 >60  ANIONGAP 8 7 9      Hematology Recent Labs Lab 07/15/16 0457 07/16/16 0204 07/16/16 2005 07/17/16 0454  WBC 7.5 8.1  --  6.6  RBC 2.95*  2.95* 2.79*  --  3.42*  HGB 7.6* 7.2* 9.1* 9.3*  HCT 25.8* 24.3* 29.9* 29.7*  MCV 87.5 87.1  --  86.8  MCH 25.8* 25.8*  --  27.2  MCHC 29.5* 29.6*  --  31.3  RDW 14.9 14.7  --  14.5  PLT 233 231  --  240    Cardiac Enzymes Recent Labs Lab 07/12/16 2235 07/13/16 0528 07/13/16 1213 07/13/16 1646  TROPONINI 1.11* 1.13* 0.93* 0.86*   No results for input(s): TROPIPOC in the last 168 hours.   BNP Recent Labs Lab 07/16/16 1452  BNP 1,452.2*     DDimer No results for input(s): DDIMER in the last 168 hours.   Radiology    Dg Chest Port 1v Same Day  Result Date: 07/16/2016 CLINICAL DATA:  Shortness of breath EXAM: PORTABLE CHEST 1 VIEW COMPARISON:  Chest x-ray of 07/12/2016 FINDINGS: There has been significant worsening of patchy airspace disease throughout the lungs right greater than left most consistent with multifocal  pneumonia. No pleural effusion is noted. Cardiomegaly is stable and pacer wires remain. The bones are osteopenic. IMPRESSION: Significant worsening of patchy airspace disease bilaterally most consistent with multifocal pneumonia. Electronically Signed   By: Ivar Drape M.D.   On: 07/16/2016 10:57    Cardiac Studies   Echo 07/14/16 Study Conclusions  - Left ventricle: There is possible apical and apical   inferolateral, apical anterior and apical lateral HK but   endocardial segments poorly visualized. recommend limited echo   with definity contrast. The cavity size was mildly dilated.   Systolic function was normal. Features are consistent with a   pseudonormal left ventricular filling pattern, with concomitant   abnormal relaxation and increased filling pressure (grade 2   diastolic dysfunction). Doppler parameters are consistent with   high ventricular filling pressure. - Mitral valve: There was trivial regurgitation. - Left atrium: The atrium was mildly dilated. Anterior-posterior   dimension: 41 mm. - Pulmonary arteries: PA peak pressure: 50 mm Hg (S). - Recommendations: Recommend limite echo with definity contrast to   assess wall motion.  Impressions:  - The right ventricular systolic pressure was increased consistent   with moderate pulmonary hypertension.  Patient Profile     81 y.o. 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  Assessment & Plan    1. Elevated troponin - had atypical chest pain last week, no further discomfort. Question demand ischemia from underlying PNA or possibly superimposed on CAD. She had resting images yesterday but did not receive Lexiscan for  stress portion given her wheezing. See below. After her walk with PT today she reports feeling SOB again and has faint wheezing/crackles at bases. I think it is safest to defer stress portion until she is more optimized to prevent acute bronchospasm. BNP was significantly elevated yesterday and she has been started on IV Lasix by IM. I notified nuc med. Continue ASA and BB. Check LFTs/lipids in AM and consider addition of statin based on result.  2.  Acute ?combined CHF - limited echo pending for further assessment of EF. BNP 1452 yesterday. Got a dose of IV Lasix yesterday with improvement and scheduled again for another 40mg  this morning. Weight down 3lb from admission. Will reassess further clinical response in AM. Already has strict I/O's and daily weights ordered.   3. Possible multifocal PNA - per IM.  4. Anemia - appears stable today. Per IM.  5. PAF - maintaining NSR. Not on [facility] given history of recurrent falls, syncope and SDH. CHADSVASC of at least 6 (HTN, age, F sex, CVA). Continue pindolol. If EF truly is down may need to consider switch to Toprol (favored over carvedilol given her wheezing).  6. Symptomatic bradycardia s/p PPM: followed by Dr. Lovena Le. Last interrogated 07/01/16 and device functioning well with good battery life.   7. Pulm HTN - PA pressure 50 on echo this admission. Follow clinically.  Signed, Charlie Pitter, PA-C  07/17/2016, 9:43 AM     The patient was seen, examined and discussed with Melina Copa, PA-C and I agree with the above.   Meredith Gonzalez a 81 y.o. 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 The patient had a LHC in 1995.No CAD withLVEF 55%. She had a nuclear stress test back in 2013 with no ischemia. Xarelto was discontinued given hx  of syncope, recurrentfalls and subdural hematoma. She was last seen by Dr. Harrington Challenger and Dr Lovena Le in 5-08/2015 and doing well.  She was in her usual state of health until 07/12/16 when she presented to [facility] with SOB. Her troponin was noted to be elevated to 1.1. CXR showed multifocal pneumonia and leukocytosis WBC 11.1, Hbg 9.4,lactic acid 0.9.Cardiology was consulted but recommended trending troponins as symptoms could be secondary to pneumonia. Cultures wereobtained patient was started on antibiotics of Rocephin and azithromycin.  She admits to exertional dyspnea and CP for the last 2-3 months, no resting chest pain. ECG shows paced rhythm. Labs show normal Crea, Hb 7.6 that might be contributing to her symptoms. Consider GI workup, she denies bleeding or melena.  We will plan for a Lexiscan nuclear stress test for tomorrow.  She presented for a stress yesterday today but was actively wheezing with O2 sats 88%, we started her on 2 L O2 via [place] and albuterol/atrovent nebulizer as well as short course of prednisone 40 mg po daily x 3 days.  She is still wheezing, we will reevaluate tomorrow. Overall she feels slightly better.

## 2016-07-17 NOTE — Progress Notes (Addendum)
PROGRESS NOTE        PATIENT DETAILS Name: Meredith Gonzalez Age: 81 y.o. Sex: female Date of Birth: [birth year] Admit Date: 07/12/2016 Admitting Physician Vianne Bulls, MD [facility]:WEXHB, Cecille Rubin, MD  Brief Narrative: Patient is a 81 y.o. female with history of CAD status post CABG, hypertension, depression, prior history of CVA with no major residual effects presented to the hospital with worsening shortness of breath, found to have multifocal pneumonia and admitted to the hospitalist service for further evaluation and treatment. Please see below for further details  Subjective: Feels much better this morning. Not short of breath. Oxygen is removed this morning.  No nausea, vomiting or chest pain.  Telemetry (personally reviewed)-sinus rhythm.  Assessment/Plan: Acute hypoxemic respiratory failure: Much improved after IV Lasix. Chest x-ray done on 4/24 showed significant worsening infiltrates-although she has possible pneumonia, cultures are negative, procalcitonin not elevated. BNP significantly elevated, suspect some amount of CHF responsible for her shortness of breath that occurred yesterday. Continue empiric antimicrobial therapy, one more dose of IV Lasix today. Started on prednisone by cardiology-I would rapidly taper. Tapering Await respiratory virus panel. Continue to titrate off oxygen.  Community acquired pneumonia: Continue empiric antimicrobial therapy-cultures negative so far. Respiratory virus panel pending. Suspect some of the shortness of breath that occurred on 4/24 secondary to congestive heart failure-much improved after Lasix infusion. Will plan on at least 7 days of empiric antimicrobial therapy. She appears significantly better compared to yesterday. Will require a chest x-ray in 4-6 weeks to document resolution of pneumonia.  Acute diastolic heart failure: Developed worsening shortness of breath on 4/24-BNP elevated-given Lasix with significant  clinical improvement. Volume status appears stable. Will give one more dose of IV Lasix today.  Elevated troponins: Likely secondary to demand ischemia, trend is flat and not consistent with ACS. Cardiology following, with plans of nuclear stress.Awaiting repeat limited echo.  Chronic normocytic Anemia: Has history of chronic normocytic anemia-and vitamin B12 deficiency. Apparently on vitamin B12 supplementation as an outpatient. FOBT negative. Reticulocyte count not elevated-are going against blood loss-anemia is likely multifactorial in the setting of chronic vitamin B12 deficiency, and likely worsened by anemia of acute illness. Transfused 1 unit of PRBC yesterday, hemoglobin improved to 9.3. Since anemia appears chronic, FOBTnegative, without GI evaluation is indicated while she is inpatient.   History of CAD: Continue aspirin and beta blocker-see above regarding positive troponins.   History of hypertension: Controlled, continue pindolol  Depression: Appears to be stable, continue Elavil and Prozac.  History of atrial fibrillation: Not a anticoagulation candidate per outpatient cardiology note (personally reviewed)  History of symptomatic bradycardia with syncope-status post permanent pacemaker implantation  DVT Prophylaxis: Prophylactic Lovenox   Code Status: Full code   Family Communication: None at bedside  Disposition Plan: Remain inpatient-home when more clinically improved, suspect requires 1-2 more days inpatient stay before discharge.  Antimicrobial agents: Anti-infectives    Start     Dose/Rate Route Frequency Ordered Stop   07/14/16 2100  azithromycin (ZITHROMAX) tablet 500 mg     500 mg Oral Daily 07/14/16 1002 07/19/16 2059   07/13/16 2200  cefTRIAXone (ROCEPHIN) 1 g in dextrose 5 % 50 mL IVPB     1 g 100 mL/hr over 30 Minutes Intravenous Every 24 hours 07/13/16 0407 07/19/16 2159   07/13/16 2100  azithromycin (ZITHROMAX) 500 mg in dextrose 5 % 250  mL IVPB   Status:  Discontinued     500 mg 250 mL/hr over 60 Minutes Intravenous Every 24 hours 07/13/16 0407 07/14/16 1002   07/12/16 2319  azithromycin (ZITHROMAX) 500 MG injection    Comments:  Davis, Misty   : cabinet override      07/12/16 2319 07/13/16 1129   07/12/16 2315  cefTRIAXone (ROCEPHIN) 1 g in dextrose 5 % 50 mL IVPB     1 g 100 mL/hr over 30 Minutes Intravenous  Once 07/12/16 2300 07/13/16 0101   07/12/16 2315  azithromycin (ZITHROMAX) 500 mg in dextrose 5 % 250 mL IVPB     500 mg 250 mL/hr over 60 Minutes Intravenous  Once 07/12/16 2300 07/13/16 0204      Procedures: None  CONSULTS:  None  Time spent: 25- minutes-Greater than 50% of this time was spent in counseling, explanation of diagnosis, planning of further management, and coordination of care.  MEDICATIONS: Scheduled Meds: . acetaminophen  500 mg Oral Daily  . amitriptyline  25 mg Oral QHS  . aspirin EC  81 mg Oral Daily  . azithromycin  500 mg Oral Daily  . calcium-vitamin D  1 tablet Oral BID WC  . cyanocobalamin  1,000 mcg Subcutaneous Daily  . enoxaparin (LOVENOX) injection  40 mg Subcutaneous Q24H  . ferrous sulfate  325 mg Oral BID WC  . FLUoxetine  60 mg Oral Daily  . furosemide  40 mg Intravenous Once  . gabapentin  300 mg Oral q morning - 10a  . gabapentin  600 mg Oral QHS  . guaiFENesin  1,200 mg Oral BID  . levalbuterol  0.63 mg Nebulization TID   Or  . ipratropium  0.5 mg Nebulization TID  . multivitamin with minerals  1 tablet Oral Daily  . pindolol  5 mg Oral BID  . predniSONE  40 mg Oral Daily  . regadenoson  0.4 mg Intravenous Once  . sodium chloride flush  3 mL Intravenous Q12H   Continuous Infusions: . sodium chloride    . cefTRIAXone (ROCEPHIN)  IV Stopped (07/17/16 0019)   PRN Meds:.sodium chloride, acetaminophen, diphenhydrAMINE, levalbuterol, LORazepam, sodium chloride flush   PHYSICAL EXAM: Vital signs: Vitals:   07/16/16 2113 07/16/16 2346 07/17/16 0627 07/17/16 0845    BP: 136/63 (!) 141/66 (!) 123/59   Pulse: 66 79 71   Resp: 18  18   Temp: 98.1 F (36.7 C)  97.8 F (36.6 C)   TempSrc: Oral  Oral   SpO2: 100%  100% 94%  Weight:   54.7 kg (120 lb 8 oz)   Height:       Filed Weights   07/15/16 0540 07/16/16 0533 07/17/16 0627  Weight: 55.3 kg (122 lb) 55.3 kg (122 lb) 54.7 kg (120 lb 8 oz)   Body mass index is 19.45 kg/m.   General appearance :Awake, alert, not in any distress.  Eyes:, pupils equally reactive to light and accomodation,no scleral icterus. HEENT: Atraumatic and Normocephalic Neck: supple, no JVD. Resp:Good air entry bilaterally-no rales or rhonchi heard today CVS: S1 S2 regular, no murmurs.  GI: Bowel sounds present, Non tender and not distended with no gaurding, rigidity or rebound. Extremities: B/L Lower Ext shows no edema, both legs are warm to touch Neurology:  speech clear,Non focal, sensation is grossly intact. Psychiatric: Normal judgment and insight. Normal mood. Musculoskeletal:No digital cyanosis Skin:No Rash, warm and dry Wounds:N/A  I have personally reviewed following labs and imaging studies  LABORATORY DATA: CBC:  Recent Labs Lab 07/12/16 2235 07/14/16 0440 07/15/16 0457 07/16/16 0204 07/16/16 2005 07/17/16 0454  WBC 11.1* 7.4 7.5 8.1  --  6.6  HGB 9.4* 7.4* 7.6* 7.2* 9.1* 9.3*  HCT 31.5* 24.9* 25.8* 24.3* 29.9* 29.7*  MCV 89.7 87.7 87.5 87.1  --  86.8  PLT 266 209 233 231  --  657    Basic Metabolic Panel:  Recent Labs Lab 07/12/16 2235 07/14/16 0440 07/15/16 0457 07/17/16 0454  NA 138 138 136 136  K 4.3 4.0 4.0 4.6  CL 102 102 101 101  CO2 28 28 28 26   GLUCOSE 171* 98 102* 233*  BUN 19 17 13  21*  CREATININE 0.65 0.65 0.58 0.69  CALCIUM 8.7* 8.3* 8.4* 8.3*    GFR: Estimated Creatinine Clearance: 50 mL/min (by C-G formula based on SCr of 0.69 mg/dL).  Liver Function Tests: No results for input(s): AST, ALT, ALKPHOS, BILITOT, PROT, ALBUMIN in the last 168 hours. No results for  input(s): LIPASE, AMYLASE in the last 168 hours. No results for input(s): AMMONIA in the last 168 hours.  Coagulation Profile: No results for input(s): INR, PROTIME in the last 168 hours.  Cardiac Enzymes:  Recent Labs Lab 07/12/16 2235 07/13/16 0528 07/13/16 1213 07/13/16 1646  TROPONINI 1.11* 1.13* 0.93* 0.86*    BNP (last 3 results) No results for input(s): PROBNP in the last 8760 hours.  HbA1C: No results for input(s): HGBA1C in the last 72 hours.  CBG:  Recent Labs Lab 07/13/16 1622 07/13/16 2115 07/14/16 0734 07/14/16 1209 07/14/16 1620  GLUCAP 122* 168* 101* 100* 93    Lipid Profile: No results for input(s): CHOL, HDL, LDLCALC, TRIG, CHOLHDL, LDLDIRECT in the last 72 hours.  Thyroid Function Tests: No results for input(s): TSH, T4TOTAL, FREET4, T3FREE, THYROIDAB in the last 72 hours.  Anemia Panel:  Recent Labs  07/15/16 0457  VITAMINB12 157*  FOLATE 29.4  FERRITIN 31  TIBC 321  IRON 7*  RETICCTPCT 1.6    Urine analysis: No results found for: COLORURINE, APPEARANCEUR, LABSPEC, PHURINE, GLUCOSEU, HGBUR, BILIRUBINUR, KETONESUR, PROTEINUR, UROBILINOGEN, NITRITE, LEUKOCYTESUR  Sepsis Labs: Lactic Acid, Venous    Component Value Date/Time   LATICACIDVEN 0.93 07/12/2016 2243    MICROBIOLOGY: Recent Results (from the past 240 hour(s))  Blood culture (routine x 2)     Status: None (Preliminary result)   Collection Time: 07/12/16 11:53 PM  Result Value Ref Range Status   Specimen Description BLOOD RIGHT ARM  Final   Special Requests   Final    BOTTLES DRAWN AEROBIC AND ANAEROBIC Blood Culture adequate volume   Culture   Final    NO GROWTH 3 DAYS Performed at [facility], [place] [address] [place], [place] [address]    Report Status PENDING  Incomplete  Blood culture (routine x 2)     Status: None (Preliminary result)   Collection Time: 07/13/16 12:02 AM  Result Value Ref Range Status   Specimen Description BLOOD RIGHT ARM  Final    Special Requests   Final    BOTTLES DRAWN AEROBIC AND ANAEROBIC Blood Culture adequate volume   Culture   Final    NO GROWTH 3 DAYS Performed at [facility], [address] [place], [place] [address]    Report Status PENDING  Incomplete    RADIOLOGY STUDIES/RESULTS: Dg Chest 2 View  Result Date: 07/12/2016 CLINICAL DATA:  Respiratory difficulty. Shortness of breath. Previous smoker. History of pneumonia and bronchitis. EXAM: CHEST  2 VIEW  COMPARISON:  05/01/2015 FINDINGS: Cardiac pacemaker. Shallow inspiration. Perihilar airspace and interstitial changes bilaterally. This is new since previous study. Peribronchial thickening. Changes likely to represent multifocal pneumonia. Normal heart size. No blunting of costophrenic angles. No pneumothorax. Calcified and tortuous aorta. Old fracture deformity of the right proximal humerus. Inferior vena caval filter. IMPRESSION: Bronchial wall thickening with perihilar interstitial and airspace disease new since previous study. Changes likely to represent multifocal pneumonia. Electronically Signed   By: Lucienne Capers M.D.   On: 07/12/2016 22:55   Dg Chest Port 1v Same Day  Result Date: 07/16/2016 CLINICAL DATA:  Shortness of breath EXAM: PORTABLE CHEST 1 VIEW COMPARISON:  Chest x-ray of 07/12/2016 FINDINGS: There has been significant worsening of patchy airspace disease throughout the lungs right greater than left most consistent with multifocal pneumonia. No pleural effusion is noted. Cardiomegaly is stable and pacer wires remain. The bones are osteopenic. IMPRESSION: Significant worsening of patchy airspace disease bilaterally most consistent with multifocal pneumonia. Electronically Signed   By: Ivar Drape M.D.   On: 07/16/2016 10:57     LOS: 4 days   Oren Binet, MD  [facility] Pager:[phone]  If 7PM-7AM, please contact night-coverage [email or web address] Password [facility] 07/17/2016, 9:23 AM

## 2016-07-18 DIAGNOSIS — D519 Vitamin B12 deficiency anemia, unspecified: Secondary | ICD-10-CM

## 2016-07-18 DIAGNOSIS — R748 Abnormal levels of other serum enzymes: Secondary | ICD-10-CM

## 2016-07-18 DIAGNOSIS — R0602 Shortness of breath: Secondary | ICD-10-CM

## 2016-07-18 LAB — CBC
HEMATOCRIT: 27.1 % — AB (ref 36.0–46.0)
HEMOGLOBIN: 8.6 g/dL — AB (ref 12.0–15.0)
MCH: 27.5 pg (ref 26.0–34.0)
MCHC: 31.7 g/dL (ref 30.0–36.0)
MCV: 86.6 fL (ref 78.0–100.0)
Platelets: 271 10*3/uL (ref 150–400)
RBC: 3.13 MIL/uL — AB (ref 3.87–5.11)
RDW: 14.7 % (ref 11.5–15.5)
WBC: 8.6 10*3/uL (ref 4.0–10.5)

## 2016-07-18 LAB — GLUCOSE, CAPILLARY: Glucose-Capillary: 90 mg/dL (ref 65–99)

## 2016-07-18 LAB — NM MYOCAR MULTI W/SPECT W/WALL MOTION / EF
Estimated workload: 1 METS
Exercise duration (min): 0 min
Exercise duration (sec): 0 s
MPHR: 142 {beats}/min
Peak HR: 85 {beats}/min
Percent HR: 59 %
Rest HR: 71 {beats}/min

## 2016-07-18 LAB — BASIC METABOLIC PANEL
ANION GAP: 9 (ref 5–15)
BUN: 20 mg/dL (ref 6–20)
CALCIUM: 8.5 mg/dL — AB (ref 8.9–10.3)
CO2: 28 mmol/L (ref 22–32)
Chloride: 101 mmol/L (ref 101–111)
Creatinine, Ser: 0.64 mg/dL (ref 0.44–1.00)
GFR calc non Af Amer: >60 mL/min (ref >60)
Glucose, Bld: 116 mg/dL — ABNORMAL HIGH (ref 65–99)
POTASSIUM: 3.6 mmol/L (ref 3.5–5.1)
Sodium: 138 mmol/L (ref 135–145)

## 2016-07-18 LAB — LIPID PANEL
Cholesterol: 118 mg/dL (ref 0–200)
HDL: 44 mg/dL (ref >40)
LDL CALC: 65 mg/dL (ref 0–99)
Total CHOL/HDL Ratio: 2.7 RATIO
Triglycerides: 44 mg/dL (ref <150)
VLDL: 9 mg/dL (ref 0–40)

## 2016-07-18 LAB — HEPATIC FUNCTION PANEL
ALBUMIN: 2.6 g/dL — AB (ref 3.5–5.0)
ALT: 15 U/L (ref 14–54)
AST: 18 U/L (ref 15–41)
Alkaline Phosphatase: 84 U/L (ref 38–126)
BILIRUBIN TOTAL: 0.4 mg/dL (ref 0.3–1.2)
Bilirubin, Direct: <0.1 mg/dL — ABNORMAL LOW (ref 0.1–0.5)
Total Protein: 6.6 g/dL (ref 6.5–8.1)

## 2016-07-18 LAB — CULTURE, BLOOD (ROUTINE X 2)
CULTURE: NO GROWTH
CULTURE: NO GROWTH
SPECIAL REQUESTS: ADEQUATE
Special Requests: ADEQUATE

## 2016-07-18 LAB — BRAIN NATRIURETIC PEPTIDE: B Natriuretic Peptide: 841.4 pg/mL — ABNORMAL HIGH (ref 0.0–100.0)

## 2016-07-18 LAB — PROCALCITONIN

## 2016-07-18 MED ORDER — REGADENOSON 0.4 MG/5ML IV SOLN
INTRAVENOUS | Status: AC
  Administered 2016-07-18: 0.4 mg via INTRAVENOUS
  Filled 2016-07-18: qty 5

## 2016-07-18 MED ORDER — REGADENOSON 0.4 MG/5ML IV SOLN
0.4000 mg | Freq: Once | INTRAVENOUS | Status: AC
  Administered 2016-07-18: 0.4 mg via INTRAVENOUS

## 2016-07-18 MED ORDER — FUROSEMIDE 10 MG/ML IJ SOLN
40.0000 mg | Freq: Once | INTRAMUSCULAR | Status: AC
Start: 2016-07-18 — End: 2016-07-18
  Administered 2016-07-18: 40 mg via INTRAVENOUS
  Filled 2016-07-18: qty 4

## 2016-07-18 MED ORDER — POTASSIUM CHLORIDE CRYS ER 20 MEQ PO TBCR
40.0000 meq | EXTENDED_RELEASE_TABLET | Freq: Once | ORAL | Status: AC
  Administered 2016-07-18: 40 meq via ORAL
  Filled 2016-07-18: qty 2

## 2016-07-18 MED ORDER — TECHNETIUM TC 99M TETROFOSMIN IV KIT
30.0000 | PACK | Freq: Once | INTRAVENOUS | Status: AC | PRN
  Administered 2016-07-18: 30 via INTRAVENOUS

## 2016-07-18 NOTE — Progress Notes (Signed)
Pt evaluated by cardiology PA, approved for stress test today. Nuclear medicine notified by cardiology; contacted this nurse advised pt ready for transport to stress test.   ccmd notified pt to nuclear med shortly. Will be off floor.

## 2016-07-18 NOTE — Progress Notes (Addendum)
Progress Note  Patient Name: Meredith Gonzalez Date of Encounter: 07/18/2016  Primary Cardiologist: Dr. Harrington Challenger Primary Electrophysiologist: Dr. Lovena Le  Subjective   She feels better today, NPO for a cardiac cath today.  Inpatient Medications    Scheduled Meds: . acetaminophen  500 mg Oral Daily  . amitriptyline  25 mg Oral QHS  . aspirin EC  81 mg Oral Daily  . azithromycin  500 mg Oral Daily  . calcium-vitamin D  1 tablet Oral BID WC  . cyanocobalamin  1,000 mcg Subcutaneous Daily  . enoxaparin (LOVENOX) injection  40 mg Subcutaneous Q24H  . ferrous sulfate  325 mg Oral BID WC  . FLUoxetine  60 mg Oral Daily  . furosemide  40 mg Intravenous Once  . gabapentin  300 mg Oral q morning - 10a  . gabapentin  600 mg Oral QHS  . guaiFENesin  1,200 mg Oral BID  . multivitamin with minerals  1 tablet Oral Daily  . pindolol  5 mg Oral BID  . potassium chloride  40 mEq Oral Once  . predniSONE  30 mg Oral Daily  . sodium chloride flush  3 mL Intravenous Q12H   Continuous Infusions: . sodium chloride    . cefTRIAXone (ROCEPHIN)  IV Stopped (07/18/16 0000)   PRN Meds: sodium chloride, acetaminophen, diphenhydrAMINE, [DISCONTINUED] levalbuterol **OR** ipratropium, levalbuterol, LORazepam, sodium chloride flush   Vital Signs    Vitals:   07/17/16 2200 07/17/16 2334 07/18/16 0302 07/18/16 0547  BP: (!) 129/59 (!) 156/68  140/60  Pulse: 76 80  71  Resp: 20   20  Temp: 98.2 F (36.8 C)   98 F (36.7 C)  TempSrc: Oral   Oral  SpO2: 94%   97%  Weight:   120 lb 14.4 oz (54.8 kg)   Height:        Intake/Output Summary (Last 24 hours) at 07/18/16 0907 Last data filed at 07/18/16 0751  Gross per 24 hour  Intake              413 ml  Output              800 ml  Net             -387 ml   Filed Weights   07/16/16 0533 07/17/16 0627 07/18/16 0302  Weight: 122 lb (55.3 kg) 120 lb 8 oz (54.7 kg) 120 lb 14.4 oz (54.8 kg)    Telemetry    A-paced, HR in 60's to 70's.  - Personally  Reviewed  ECG   No new tracings.   Physical Exam   General: Well developed, well nourished Caucasian female appearing in no acute distress. Head: Normocephalic, atraumatic.  Neck: Supple without bruits, JVD at 8cm. Lungs:  Resp regular and unlabored, no active wheezing. Minimal rales at bases bilaterally.  Heart: RRR, S1, S2, no S3, S4, or murmur; no rub. Abdomen: Soft, non-tender, non-distended with normoactive bowel sounds. No hepatomegaly. No rebound/guarding. No obvious abdominal masses. Extremities: No clubbing, cyanosis, trace lower extremity edema. Distal pedal pulses are 2+ bilaterally. Neuro: Alert and oriented X 3. Moves all extremities spontaneously. Psych: Normal affect.  Labs    Chemistry Recent Labs Lab 07/15/16 0457 07/17/16 0454 07/18/16 0349  NA 136 136 138  K 4.0 4.6 3.6  CL 101 101 101  CO2 28 26 28   GLUCOSE 102* 233* 116*  BUN 13 21* 20  CREATININE 0.58 0.69 0.64  CALCIUM 8.4* 8.3* 8.5*  PROT  --   --  6.6  ALBUMIN  --   --  2.6*  AST  --   --  18  ALT  --   --  15  ALKPHOS  --   --  84  BILITOT  --   --  0.4  GFRNONAA >60 >60 >60  GFRAA >60 >60 >60  ANIONGAP 7 9 9      Hematology Recent Labs Lab 07/16/16 0204 07/16/16 2005 07/17/16 0454 07/18/16 0349  WBC 8.1  --  6.6 8.6  RBC 2.79*  --  3.42* 3.13*  HGB 7.2* 9.1* 9.3* 8.6*  HCT 24.3* 29.9* 29.7* 27.1*  MCV 87.1  --  86.8 86.6  MCH 25.8*  --  27.2 27.5  MCHC 29.6*  --  31.3 31.7  RDW 14.7  --  14.5 14.7  PLT 231  --  240 271    Cardiac Enzymes Recent Labs Lab 07/12/16 2235 07/13/16 0528 07/13/16 1213 07/13/16 1646  TROPONINI 1.11* 1.13* 0.93* 0.86*   No results for input(s): TROPIPOC in the last 168 hours.   BNP Recent Labs Lab 07/16/16 1452 07/18/16 0349  BNP 1,452.2* 841.4*     DDimer No results for input(s): DDIMER in the last 168 hours.   Radiology    Dg Chest Port 1v Same Day  Result Date: 07/16/2016 CLINICAL DATA:  Shortness of breath EXAM: PORTABLE CHEST  1 VIEW COMPARISON:  Chest x-ray of 07/12/2016 FINDINGS: There has been significant worsening of patchy airspace disease throughout the lungs right greater than left most consistent with multifocal pneumonia. No pleural effusion is noted. Cardiomegaly is stable and pacer wires remain. The bones are osteopenic. IMPRESSION: Significant worsening of patchy airspace disease bilaterally most consistent with multifocal pneumonia. Electronically Signed   By: Ivar Drape M.D.   On: 07/16/2016 10:57    Cardiac Studies   Echocardiogram:  07/14/2016 Study Conclusions  - Left ventricle: There is possible apical and apical inferolateral, apical anterior and apical lateral HK but endocardial segments poorly visualized. recommend limited echo with definity contrast. The cavity size was mildly dilated. Systolic function was normal. Features are consistent with a pseudonormal left ventricular filling pattern, with concomitant abnormal relaxation and increased filling pressure (grade 2 diastolic dysfunction). Doppler parameters are consistent with high ventricular filling pressure. - Mitral valve: There was trivial regurgitation. - Left atrium: The atrium was mildly dilated. Anterior-posterior dimension: 41 mm. - Pulmonary arteries: PA peak pressure: 50 mm Hg (S). - Recommendations: Recommend limite echo with definity contrast to assess wall motion.  Impressions:  - The right ventricular systolic pressure was increased consistent with moderate pulmonary hypertension.  Limited Echo: 07/17/2016 Study Conclusions  - Left ventricle: The cavity size was normal. Wall thickness was   normal. The estimated ejection fraction was 50%. Hypokinesis of   the apical anterior wall, apical septal wall, and true apex. - Mitral valve: Mildly calcified annulus. Mildly calcified leaflets   . There was trivial regurgitation. - Left atrium: The atrium was mildly dilated. - Right ventricle: The  cavity size was normal. Pacer wire or   catheter noted in right ventricle. Systolic function was normal.  Impressions:  - Limited echo for wall motion. Definity contrast given.   Patient Profile     81 y.o. female with PMH of no prior CAD (cath 1995 with no CAD, EF 55%, nonischemic nuc 2013), HTN, hypothyroidism, chronic anemia, paroxysmal atrial fib, syncope, SDH after a fall, symptomatic bradycardia (s/p PPM with RV lead replacement), and hx of CVA who was transferred to  [facility] from [facility] on 07/13/16 for worsening SOB, possible multifocal PNA and elevated troponin.  Assessment & Plan    1. Elevated troponin - cyclic troponin values this admission have been at 1.11, 1.13, 0.93, and 0.86.  - believed to be demand ischemia with pneumonia, however Troponin up to 1.1, however stress test abnormal yesterday with a small apical scar and peri-infarct ischemia - we will plan for a LHC today   2. Acute Diastolic CHF - limited echo completed on 4/25 and showed an EF of 50% with hypokinesis of the apical anterior wall, apical septal wall, and true apex. - BNP elevated to 1452 on 4/24 --> improved to 841 this AM.  - she appears euvolemic, I will switch to lasix 20 mg po daily   3. Multifocal PNA  - per admitting team.   4. Normocytic Anemia - Hgb at 8.6 this AM. Received 1 unit pRBCs on 4/24. - she denies any evidence of active bleeding. FOB negative.   - per admitting team  5. PAF  - maintaining NSR. Not on [facility] given history of recurrent falls, syncope, SDH, and now anemia. CHADSVASC of at least 6 (HTN, Age, F sex, CVA). Continue Pindolol 5mg  BID for rate-control.  6. Symptomatic bradycardia - s/p PPM with lead revision in 12/2012 - followed by Dr. Lovena Le. Last interrogated 07/01/16 and device functioning well with good battery life.   7. Pulmonary HTN  - PA pressure 50 on echo this admission. Follow clinically.   Ena Dawley, MD 07/18/2016

## 2016-07-18 NOTE — Progress Notes (Signed)
Pt returned from stress test

## 2016-07-18 NOTE — Progress Notes (Signed)
Page to T.Greene,PA to request clarification on stress test recommendation/timeline.

## 2016-07-18 NOTE — Progress Notes (Signed)
PROGRESS NOTE  Meredith Gonzalez UVO:[id number] DOB: 1936-02-08 DOA: 07/12/2016 PCP: Sinclair Ship, MD   LOS: 5 days   Brief Narrative: Patient is a 81 y.o. female with history of CAD status post CABG, hypertension, depression, prior history of CVA with no major residual effects presented to the hospital with worsening shortness of breath, found to have multifocal pneumonia and admitted to the hospitalist service for further evaluation and treatment. Please see below for further details  Subjective: Pt continues to show improvement, reports feeling back to baseline today. Not experiencing any SOB, CP, dyspnea, even with movement. Continues to remain on RA with no concerns. Gave verbal consent to speak to son regarding her status and our treatment plan.   Assessment & Plan: Acute hypoxemic respiratory failure: Much improved after IV Lasix. Chest x-ray done on 4/24 showed significant worsening infiltrates-although she has possible pneumonia, cultures are negative, procalcitonin not elevated. BNP significantly elevated, suspect some amount of CHF responsible for her shortness of breath that occurred few days ago. Continue empiric antimicrobial therapy, gave one dose of Lasix this AM. Started on prednisone by cardiology-I would rapidly taper, d/c tomorrow. Respiratory virus panel negative. Pt has been titrated off O2, on RA now with no complaints.  Community acquired pneumonia: Continue empiric antimicrobial therapy-cultures negative so far. Respiratory virus panel negative. Suspect some of the shortness of breath that occurred on 4/24 secondary to congestive heart failure-much improved after Lasix infusion. Will plan on at least 7 days of empiric antimicrobial therapy. She appears significantly better compared to yesterday, almost back to baseline per pt. Will require a chest x-ray in 4-6 weeks to document resolution of pneumonia.  Acute diastolic heart failure: Developed worsening shortness of breath on  4/24-BNP elevated-given Lasix with significant clinical improvement, BNP is trending down. Monitor. Volume status appears stable. Gave one more dose of IV Lasix today.  Elevated troponins: Likely secondary to demand ischemia, trend is flat and not consistent with ACS. Cardiology following, plans of nuclear stress for today. Echo clear, she is able to ambulate without SOB or complaints.   Chronic normocytic Anemia: Has history of chronic normocytic anemia-and vitamin B12 deficiency. Apparently on vitamin B12 supplementation as an outpatient. FOBT negative. Reticulocyte count not elevated-are going against blood loss-anemia is likely multifactorial in the setting of chronic vitamin B12 deficiency, and likely worsened by anemia of acute illness. Transfused 1 unit of PRBC 4/24, hemoglobin is 8.6 today. Since anemia appears chronic, FOBTnegative, without GI evaluation is indicated while she is inpatient.   History of CAD: Continue aspirin and beta blocker-see above regarding positive troponins. Lipid panel WNL.  History of hypertension: Controlled, continue pindolol  Depression: Appears to be stable, continue Elavil and Prozac.  History of atrial fibrillation: Not a anticoagulation candidate per outpatient cardiology note (personally reviewed)  History of symptomatic bradycardia with syncope-status post permanent pacemaker implantation  DVT prophylaxis: Lovenox Code Status: Full Family Communication: None at bedside, son involved in care, pt gave verbal consent to discuss plans with son Disposition Plan: home when clinically improved, most likely tomorrow  Consultants:   Cardiology  Procedures:   Nuclear stress test 4/26: Pending   Echo 4/25: Left ventricle: The cavity size was normal. Wall thickness was normal. The estimated ejection fraction was 50%. Hypokinesis of the apical anterior wall, apical septal wall, and true apex. Mitral valve: Mildly calcified annulus. Mildly calcified  leaflets. There was trivial regurgitation. Left atrium: The atrium was mildly dilated. Right ventricle: The cavity size was normal. Pacer wire or  catheter noted in right ventricle. Systolic function was normal.   Echo 4/22: The right ventricular systolic pressure was increased consistent with moderate pulmonary hypertension.Recommendations:  Recommend limite echo with definity contrast to assess wall motion.  Antimicrobials:  Azithromycin 4/20 >> 4/27  Ceftriaxone 4/20 >> 4/27  Time spent: 25- minutes-Greater than 50% of this time was spent in counseling, explanation of diagnosis, planning of further management, and coordination of care.  Objective: Vitals:   07/17/16 2200 07/17/16 2334 07/18/16 0302 07/18/16 0547  BP: (!) 129/59 (!) 156/68  140/60  Pulse: 76 80  71  Resp: 20   20  Temp: 98.2 F (36.8 C)   98 F (36.7 C)  TempSrc: Oral   Oral  SpO2: 94%   97%  Weight:   54.8 kg (120 lb 14.4 oz)   Height:        Intake/Output Summary (Last 24 hours) at 07/18/16 0956 Last data filed at 07/18/16 0751  Gross per 24 hour  Intake              293 ml  Output              800 ml  Net             -507 ml   Filed Weights   07/16/16 0533 07/17/16 0627 07/18/16 0302  Weight: 55.3 kg (122 lb) 54.7 kg (120 lb 8 oz) 54.8 kg (120 lb 14.4 oz)    Examination: Constitutional: NAD Vitals:   07/17/16 2200 07/17/16 2334 07/18/16 0302 07/18/16 0547  BP: (!) 129/59 (!) 156/68  140/60  Pulse: 76 80  71  Resp: 20   20  Temp: 98.2 F (36.8 C)   98 F (36.7 C)  TempSrc: Oral   Oral  SpO2: 94%   97%  Weight:   54.8 kg (120 lb 14.4 oz)   Height:        HEENT: Atraumatic and normocephalic Respiratory: clear to auscultation bilaterally, no wheezing, no crackles. Normal respiratory effort. No accessory muscle use.  Cardiovascular: S1 and S2 heard, no murmurs. No LE edema.   Abdomen: no tenderness.  Skin: no lesions Psychiatric: Normal judgment and insight. Alert and oriented x 3. Normal  mood.    Data Reviewed: I have personally reviewed following labs and imaging studies  CBC:  Recent Labs Lab 07/14/16 0440 07/15/16 0457 07/16/16 0204 07/16/16 2005 07/17/16 0454 07/18/16 0349  WBC 7.4 7.5 8.1  --  6.6 8.6  HGB 7.4* 7.6* 7.2* 9.1* 9.3* 8.6*  HCT 24.9* 25.8* 24.3* 29.9* 29.7* 27.1*  MCV 87.7 87.5 87.1  --  86.8 86.6  PLT 209 233 231  --  240 595   Basic Metabolic Panel:  Recent Labs Lab 07/12/16 2235 07/14/16 0440 07/15/16 0457 07/17/16 0454 07/18/16 0349  NA 138 138 136 136 138  K 4.3 4.0 4.0 4.6 3.6  CL 102 102 101 101 101  CO2 28 28 28 26 28   GLUCOSE 171* 98 102* 233* 116*  BUN 19 17 13  21* 20  CREATININE 0.65 0.65 0.58 0.69 0.64  CALCIUM 8.7* 8.3* 8.4* 8.3* 8.5*   GFR: Estimated Creatinine Clearance: 50.1 mL/min (by C-G formula based on SCr of 0.64 mg/dL). Liver Function Tests:  Recent Labs Lab 07/18/16 0349  AST 18  ALT 15  ALKPHOS 84  BILITOT 0.4  PROT 6.6  ALBUMIN 2.6*   No results for input(s): LIPASE, AMYLASE in the last 168 hours. No results for input(s): AMMONIA in the  last 168 hours. Coagulation Profile: No results for input(s): INR, PROTIME in the last 168 hours. Cardiac Enzymes:  Recent Labs Lab 07/12/16 2235 07/13/16 0528 07/13/16 1213 07/13/16 1646  TROPONINI 1.11* 1.13* 0.93* 0.86*   BNP (last 3 results) No results for input(s): PROBNP in the last 8760 hours. HbA1C: No results for input(s): HGBA1C in the last 72 hours. CBG:  Recent Labs Lab 07/13/16 2115 07/14/16 0734 07/14/16 1209 07/14/16 1620 07/18/16 0741  GLUCAP 168* 101* 100* 93 90   Lipid Profile:  Recent Labs  07/18/16 0349  CHOL 118  HDL 44  LDLCALC 65  TRIG 44  CHOLHDL 2.7   Thyroid Function Tests: No results for input(s): TSH, T4TOTAL, FREET4, T3FREE, THYROIDAB in the last 72 hours. Anemia Panel: No results for input(s): VITAMINB12, FOLATE, FERRITIN, TIBC, IRON, RETICCTPCT in the last 72 hours. Urine analysis: No results found  for: COLORURINE, APPEARANCEUR, LABSPEC, PHURINE, GLUCOSEU, HGBUR, BILIRUBINUR, KETONESUR, PROTEINUR, UROBILINOGEN, NITRITE, LEUKOCYTESUR Sepsis Labs: Invalid input(s): PROCALCITONIN, LACTICIDVEN  Recent Results (from the past 240 hour(s))  Blood culture (routine x 2)     Status: None (Preliminary result)   Collection Time: 07/12/16 11:53 PM  Result Value Ref Range Status   Specimen Description BLOOD RIGHT ARM  Final   Special Requests   Final    BOTTLES DRAWN AEROBIC AND ANAEROBIC Blood Culture adequate volume   Culture   Final    NO GROWTH 4 DAYS Performed at [facility], [address], [place], [place] [address]    Report Status PENDING  Incomplete  Blood culture (routine x 2)     Status: None (Preliminary result)   Collection Time: 07/13/16 12:02 AM  Result Value Ref Range Status   Specimen Description BLOOD RIGHT ARM  Final   Special Requests   Final    BOTTLES DRAWN AEROBIC AND ANAEROBIC Blood Culture adequate volume   Culture   Final    NO GROWTH 4 DAYS Performed at [facility], [place] [address], [place], [place] [address]    Report Status PENDING  Incomplete  Respiratory Panel by PCR     Status: None   Collection Time: 07/16/16  4:56 PM  Result Value Ref Range Status   Adenovirus NOT DETECTED NOT DETECTED Final   Coronavirus 229E NOT DETECTED NOT DETECTED Final   Coronavirus HKU1 NOT DETECTED NOT DETECTED Final   Coronavirus NL63 NOT DETECTED NOT DETECTED Final   Coronavirus OC43 NOT DETECTED NOT DETECTED Final   Metapneumovirus NOT DETECTED NOT DETECTED Final   Rhinovirus / Enterovirus NOT DETECTED NOT DETECTED Final   Influenza A NOT DETECTED NOT DETECTED Final   Influenza B NOT DETECTED NOT DETECTED Final   Parainfluenza Virus 1 NOT DETECTED NOT DETECTED Final   Parainfluenza Virus 2 NOT DETECTED NOT DETECTED Final   Parainfluenza Virus 3 NOT DETECTED NOT DETECTED Final   Parainfluenza Virus 4 NOT DETECTED NOT DETECTED Final   Respiratory Syncytial  Virus NOT DETECTED NOT DETECTED Final   Bordetella pertussis NOT DETECTED NOT DETECTED Final   Chlamydophila pneumoniae NOT DETECTED NOT DETECTED Final   Mycoplasma pneumoniae NOT DETECTED NOT DETECTED Final      Radiology Studies: Dg Chest Port 1v Same Day  Result Date: 07/16/2016 CLINICAL DATA:  Shortness of breath EXAM: PORTABLE CHEST 1 VIEW COMPARISON:  Chest x-ray of 07/12/2016 FINDINGS: There has been significant worsening of patchy airspace disease throughout the lungs right greater than left most consistent with multifocal pneumonia. No pleural effusion is  noted. Cardiomegaly is stable and pacer wires remain. The bones are osteopenic. IMPRESSION: Significant worsening of patchy airspace disease bilaterally most consistent with multifocal pneumonia. Electronically Signed   By: Ivar Drape M.D.   On: 07/16/2016 10:57     Scheduled Meds: . regadenoson      . acetaminophen  500 mg Oral Daily  . amitriptyline  25 mg Oral QHS  . aspirin EC  81 mg Oral Daily  . azithromycin  500 mg Oral Daily  . calcium-vitamin D  1 tablet Oral BID WC  . cyanocobalamin  1,000 mcg Subcutaneous Daily  . enoxaparin (LOVENOX) injection  40 mg Subcutaneous Q24H  . ferrous sulfate  325 mg Oral BID WC  . FLUoxetine  60 mg Oral Daily  . furosemide  40 mg Intravenous Once  . gabapentin  300 mg Oral q morning - 10a  . gabapentin  600 mg Oral QHS  . guaiFENesin  1,200 mg Oral BID  . multivitamin with minerals  1 tablet Oral Daily  . pindolol  5 mg Oral BID  . potassium chloride  40 mEq Oral Once  . predniSONE  30 mg Oral Daily  . sodium chloride flush  3 mL Intravenous Q12H   Continuous Infusions: . sodium chloride    . cefTRIAXone (ROCEPHIN)  IV Stopped (07/18/16 0000)    Posey Pronto, PA-S  If 7PM-7AM, please contact night-coverage [email or web address] Password [facility] 07/18/2016, 9:56 AM     Attending MD note  Patient was seen, examined,treatment plan was discussed with the PA-S.  I have  personally reviewed the clinical findings, lab, imaging studies and management of this patient in detail. I agree with the documentation, as recorded by the PA-S.   Subjective: Lying comfortably-feels better-she did not get short of breath after she walked around in the room/back from the bathroom this morning.  Denies any chest pain.  Telemetry (Personally reviewed): Sinus rhythm  On Exam: Vitals:   07/18/16 0959 07/18/16 1021 07/18/16 1023 07/18/16 1025  BP:  (!) 152/68 134/64 (!) 139/58  Pulse: 71 85 81 80  Resp:      Temp:      TempSrc:      SpO2:      Weight:      Height:       Gen. exam: Awake, alert, not in any distress Chest: Good air entry bilaterally, no rhonchi or rales CVS: S1-S2 regular, no murmurs Abdomen: Soft, nontender and nondistended Neurology: Non-focal Skin: No rash or lesions   Lab Data: CBC:  Recent Labs Lab 07/14/16 0440 07/15/16 0457 07/16/16 0204 07/16/16 2005 07/17/16 0454 07/18/16 0349  WBC 7.4 7.5 8.1  --  6.6 8.6  HGB 7.4* 7.6* 7.2* 9.1* 9.3* 8.6*  HCT 24.9* 25.8* 24.3* 29.9* 29.7* 27.1*  MCV 87.7 87.5 87.1  --  86.8 86.6  PLT 209 233 231  --  240 702    Basic Metabolic Panel:  Recent Labs Lab 07/12/16 2235 07/14/16 0440 07/15/16 0457 07/17/16 0454 07/18/16 0349  NA 138 138 136 136 138  K 4.3 4.0 4.0 4.6 3.6  CL 102 102 101 101 101  CO2 28 28 28 26 28   GLUCOSE 171* 98 102* 233* 116*  BUN 19 17 13  21* 20  CREATININE 0.65 0.65 0.58 0.69 0.64  CALCIUM 8.7* 8.3* 8.4* 8.3* 8.5*    GFR: Estimated Creatinine Clearance: 50.1 mL/min (by C-G formula based on SCr of 0.64 mg/dL).  Liver Function Tests:  Recent Labs Lab 07/18/16 [phone]  AST 18  ALT 15  ALKPHOS 84  BILITOT 0.4  PROT 6.6  ALBUMIN 2.6*   No results for input(s): LIPASE, AMYLASE in the last 168 hours. No results for input(s): AMMONIA in the last 168 hours.  Coagulation Profile: No results for input(s): INR, PROTIME in the last 168  hours.  Impression Community acquired pneumonia: Improved-last day of antimicrobial therapy on 4/17. Will require a repeat chest x-ray. Surprisingly-Pro calcitonin negative-respiratory virus panel negative-cultures remain negative to date  Acute diastolic heart failure: Much more compensated following initiation of Lasix. Echo done yesterday-/preserved EF but wall motion abnormalities  Elevated troponins: Nuclear stress test today. Await results  Anemia: Multifactorial-has chronic normocytic anemia at baseline likely from vitamin B12 deficiency. Required 1 unit of PRBC transfusion. Continue iron and vitamin B12 supplementation. FOBT negative.  Plan: Continue antimicrobial therapy, await nuclear stress was results. Continue templated physical therapy. Await further input from cardiology. If continues to do well-home on 4/27.  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

## 2016-07-18 NOTE — Progress Notes (Signed)
Patient had hard time sleeping tonight, slept very well last night. Gave anti-anxiety.

## 2016-07-18 NOTE — Care Management Important Message (Signed)
Important Message  Patient Details  Name: Meredith Gonzalez MRN: [id number] Date of Birth: 08-Jul-1935   Medicare Important Message Given:  Yes    [name] 07/18/2016, 3:35 PM

## 2016-07-18 NOTE — Progress Notes (Signed)
Pt transported to nuclear medicine for stress test.

## 2016-07-19 ENCOUNTER — Encounter (HOSPITAL_COMMUNITY)
Admission: EM | Disposition: A | Discharge status: Home / Self Care | Payer: Self-pay | Source: Home / Self Care | Attending: Internal Medicine

## 2016-07-19 DIAGNOSIS — I2510 Atherosclerotic heart disease of native coronary artery without angina pectoris: Secondary | ICD-10-CM

## 2016-07-19 DIAGNOSIS — R9439 Abnormal result of other cardiovascular function study: Secondary | ICD-10-CM

## 2016-07-19 HISTORY — PX: LEFT HEART CATH AND CORONARY ANGIOGRAPHY: CATH118249

## 2016-07-19 HISTORY — PX: CORONARY STENT INTERVENTION: CATH118234

## 2016-07-19 LAB — CBC
HCT: 29.2 % — ABNORMAL LOW (ref 36.0–46.0)
Hemoglobin: 8.8 g/dL — ABNORMAL LOW (ref 12.0–15.0)
MCH: 26.2 pg (ref 26.0–34.0)
MCHC: 30.1 g/dL (ref 30.0–36.0)
MCV: 86.9 fL (ref 78.0–100.0)
PLATELETS: 326 10*3/uL (ref 150–400)
RBC: 3.36 MIL/uL — ABNORMAL LOW (ref 3.87–5.11)
RDW: 14.6 % (ref 11.5–15.5)
WBC: 8.5 10*3/uL (ref 4.0–10.5)

## 2016-07-19 LAB — BASIC METABOLIC PANEL
Anion gap: 8 (ref 5–15)
BUN: 17 mg/dL (ref 6–20)
CALCIUM: 8.6 mg/dL — AB (ref 8.9–10.3)
CO2: 30 mmol/L (ref 22–32)
Chloride: 102 mmol/L (ref 101–111)
Creatinine, Ser: 0.67 mg/dL (ref 0.44–1.00)
Glucose, Bld: 146 mg/dL — ABNORMAL HIGH (ref 65–99)
Potassium: 4.1 mmol/L (ref 3.5–5.1)
SODIUM: 140 mmol/L (ref 135–145)

## 2016-07-19 LAB — GLUCOSE, CAPILLARY: GLUCOSE-CAPILLARY: 116 mg/dL — AB (ref 65–99)

## 2016-07-19 LAB — PROTIME-INR
INR: 1.14
PROTHROMBIN TIME: 14.6 s (ref 11.4–15.2)

## 2016-07-19 LAB — POCT ACTIVATED CLOTTING TIME
ACTIVATED CLOTTING TIME: 268 s
Activated Clotting Time: 208 seconds

## 2016-07-19 SURGERY — LEFT HEART CATH AND CORONARY ANGIOGRAPHY
Anesthesia: LOCAL

## 2016-07-19 MED ORDER — IOPAMIDOL (ISOVUE-370) INJECTION 76%
INTRAVENOUS | Status: AC
  Filled 2016-07-19: qty 50

## 2016-07-19 MED ORDER — ENOXAPARIN SODIUM 40 MG/0.4ML ~~LOC~~ SOLN: 40.0000 mg | SUBCUTANEOUS | Status: DC

## 2016-07-19 MED ORDER — NON FORMULARY: 9.0000 mg | Freq: Once | Status: DC

## 2016-07-19 MED ORDER — HEART ATTACK BOUNCING BOOK
Freq: Once | Status: AC
  Administered 2016-07-19: 21:00:00
  Filled 2016-07-19: qty 1

## 2016-07-19 MED ORDER — HEPARIN SODIUM (PORCINE) 1000 UNIT/ML IJ SOLN
INTRAMUSCULAR | Status: DC | PRN
  Administered 2016-07-19: 2000 [IU] via INTRAVENOUS
  Administered 2016-07-19 (×2): 3000 [IU] via INTRAVENOUS

## 2016-07-19 MED ORDER — NITROGLYCERIN 1 MG/10 ML FOR IR/CATH LAB
INTRA_ARTERIAL | Status: DC | PRN
  Administered 2016-07-19: 200 ug via INTRACORONARY

## 2016-07-19 MED ORDER — SODIUM CHLORIDE 0.9% FLUSH: 3.0000 mL | INTRAVENOUS | Status: DC | PRN

## 2016-07-19 MED ORDER — CLOPIDOGREL BISULFATE 75 MG PO TABS
75.0000 mg | ORAL_TABLET | Freq: Every day | ORAL | Status: DC
  Administered 2016-07-20: 75 mg via ORAL
  Filled 2016-07-19: qty 1

## 2016-07-19 MED ORDER — IOPAMIDOL (ISOVUE-370) INJECTION 76%
INTRAVENOUS | Status: AC
  Filled 2016-07-19: qty 100

## 2016-07-19 MED ORDER — SODIUM CHLORIDE 0.9 % WEIGHT BASED INFUSION
1.0000 mL/kg/h | INTRAVENOUS | Status: AC
  Administered 2016-07-19 (×2): 1 mL/kg/h via INTRAVENOUS

## 2016-07-19 MED ORDER — LIDOCAINE HCL (PF) 1 % IJ SOLN
INTRAMUSCULAR | Status: AC
  Filled 2016-07-19: qty 30

## 2016-07-19 MED ORDER — GUAIFENESIN-DM 100-10 MG/5ML PO SYRP
15.0000 mL | ORAL_SOLUTION | ORAL | Status: DC | PRN
  Administered 2016-07-19: 23:00:00 15 mL via ORAL
  Filled 2016-07-19 (×2): qty 15

## 2016-07-19 MED ORDER — SODIUM CHLORIDE 0.9 % IV SOLN: 250.0000 mL | INTRAVENOUS | Status: DC | PRN

## 2016-07-19 MED ORDER — CLOPIDOGREL BISULFATE 300 MG PO TABS
ORAL_TABLET | ORAL | Status: DC | PRN
  Administered 2016-07-19: 600 mg via ORAL

## 2016-07-19 MED ORDER — ENOXAPARIN SODIUM 40 MG/0.4ML ~~LOC~~ SOLN
30.0000 mg | SUBCUTANEOUS | Status: DC
  Administered 2016-07-20: 30 mg via SUBCUTANEOUS
  Filled 2016-07-19: qty 0.4

## 2016-07-19 MED ORDER — SODIUM CHLORIDE 0.9 % WEIGHT BASED INFUSION: 1.0000 mL/kg/h | INTRAVENOUS | Status: DC

## 2016-07-19 MED ORDER — ATORVASTATIN CALCIUM 20 MG PO TABS
20.0000 mg | ORAL_TABLET | Freq: Every day | ORAL | Status: DC
  Administered 2016-07-19: 20 mg via ORAL
  Filled 2016-07-19: qty 1

## 2016-07-19 MED ORDER — ANGIOPLASTY BOOK
Freq: Once | Status: AC
  Administered 2016-07-19: 21:00:00
  Filled 2016-07-19: qty 1

## 2016-07-19 MED ORDER — VERAPAMIL HCL 2.5 MG/ML IV SOLN
INTRAVENOUS | Status: DC | PRN
  Administered 2016-07-19: 10 mL via INTRA_ARTERIAL

## 2016-07-19 MED ORDER — VERAPAMIL HCL 2.5 MG/ML IV SOLN
INTRAVENOUS | Status: AC
  Filled 2016-07-19: qty 2

## 2016-07-19 MED ORDER — SODIUM CHLORIDE 0.9 % WEIGHT BASED INFUSION
3.0000 mL/kg/h | INTRAVENOUS | Status: DC
  Administered 2016-07-19: 3 mL/kg/h via INTRAVENOUS

## 2016-07-19 MED ORDER — LIDOCAINE HCL (PF) 1 % IJ SOLN
INTRAMUSCULAR | Status: DC | PRN
  Administered 2016-07-19: 2 mL

## 2016-07-19 MED ORDER — HEPARIN (PORCINE) IN NACL 2-0.9 UNIT/ML-% IJ SOLN
INTRAMUSCULAR | Status: DC | PRN
  Administered 2016-07-19: 1000 mL

## 2016-07-19 MED ORDER — DIPHENHYDRAMINE HCL 50 MG/ML IJ SOLN
INTRAMUSCULAR | Status: DC | PRN
  Administered 2016-07-19: 25 mg via INTRAVENOUS

## 2016-07-19 MED ORDER — NITROGLYCERIN 1 MG/10 ML FOR IR/CATH LAB
INTRA_ARTERIAL | Status: AC
  Filled 2016-07-19: qty 10

## 2016-07-19 MED ORDER — DIPHENHYDRAMINE HCL 50 MG/ML IJ SOLN
INTRAMUSCULAR | Status: AC
  Filled 2016-07-19: qty 1

## 2016-07-19 MED ORDER — IOPAMIDOL (ISOVUE-370) INJECTION 76%
INTRAVENOUS | Status: DC | PRN
  Administered 2016-07-19: 115 mL via INTRA_ARTERIAL

## 2016-07-19 MED ORDER — MELATONIN 3 MG PO TABS
9.0000 mg | ORAL_TABLET | Freq: Once | ORAL | Status: AC
  Administered 2016-07-19: 9 mg via ORAL
  Filled 2016-07-19: qty 3

## 2016-07-19 MED ORDER — CLOPIDOGREL BISULFATE 300 MG PO TABS
ORAL_TABLET | ORAL | Status: AC
  Filled 2016-07-19: qty 2

## 2016-07-19 MED ORDER — SODIUM CHLORIDE 0.9% FLUSH: 3.0000 mL | Freq: Two times a day (BID) | INTRAVENOUS | Status: DC

## 2016-07-19 MED ORDER — HEPARIN SODIUM (PORCINE) 1000 UNIT/ML IJ SOLN
INTRAMUSCULAR | Status: AC
  Filled 2016-07-19: qty 1

## 2016-07-19 MED ORDER — HEPARIN (PORCINE) IN NACL 2-0.9 UNIT/ML-% IJ SOLN
INTRAMUSCULAR | Status: AC
  Filled 2016-07-19: qty 1000

## 2016-07-19 MED ORDER — SODIUM CHLORIDE 0.9% FLUSH
3.0000 mL | Freq: Two times a day (BID) | INTRAVENOUS | Status: DC
  Administered 2016-07-19: 3 mL via INTRAVENOUS

## 2016-07-19 SURGICAL SUPPLY — 17 items
BALLN EMERGE MR 2.0X12 (BALLOONS) ×2
BALLOON EMERGE MR 2.0X12 (BALLOONS) ×1 IMPLANT
CATH 5FR JL3.5 JR4 ANG PIG MP (CATHETERS) ×2 IMPLANT
CATH LAUNCHER 5F EBU3.5 (CATHETERS) ×2 IMPLANT
DEVICE RAD COMP TR BAND LRG (VASCULAR PRODUCTS) ×2 IMPLANT
GUIDEWIRE INQWIRE 1.5J.035X260 (WIRE) ×1 IMPLANT
INQWIRE 1.5J .035X260CM (WIRE) ×2
KIT ENCORE 26 ADVANTAGE (KITS) ×2 IMPLANT
KIT HEART LEFT (KITS) ×2 IMPLANT
PACK CARDIAC CATHETERIZATION (CUSTOM PROCEDURE TRAY) ×2 IMPLANT
SHEATH GLIDE SLENDER 4/5FR (SHEATH) ×2 IMPLANT
STENT PROMUS PREM MR 2.5X16 (Permanent Stent) ×2 IMPLANT
SYR MEDRAD MARK V 150ML (SYRINGE) ×2 IMPLANT
TRANSDUCER W/STOPCOCK (MISCELLANEOUS) ×2 IMPLANT
TUBING CIL FLEX 10 FLL-RA (TUBING) ×2 IMPLANT
WIRE ASAHI PROWATER 180CM (WIRE) ×2 IMPLANT
WIRE HI TORQ VERSACORE-J 145CM (WIRE) ×2 IMPLANT

## 2016-07-19 NOTE — H&P (View-Only) (Signed)
Progress Note  Patient Name: Meredith Gonzalez Date of Encounter: 07/18/2016  Primary Cardiologist: Dr. Harrington Challenger Primary Electrophysiologist: Dr. Lovena Le  Subjective   She feels better today, NPO for a cardiac cath today.  Inpatient Medications    Scheduled Meds: . acetaminophen  500 mg Oral Daily  . amitriptyline  25 mg Oral QHS  . aspirin EC  81 mg Oral Daily  . azithromycin  500 mg Oral Daily  . calcium-vitamin D  1 tablet Oral BID WC  . cyanocobalamin  1,000 mcg Subcutaneous Daily  . enoxaparin (LOVENOX) injection  40 mg Subcutaneous Q24H  . ferrous sulfate  325 mg Oral BID WC  . FLUoxetine  60 mg Oral Daily  . furosemide  40 mg Intravenous Once  . gabapentin  300 mg Oral q morning - 10a  . gabapentin  600 mg Oral QHS  . guaiFENesin  1,200 mg Oral BID  . multivitamin with minerals  1 tablet Oral Daily  . pindolol  5 mg Oral BID  . potassium chloride  40 mEq Oral Once  . predniSONE  30 mg Oral Daily  . sodium chloride flush  3 mL Intravenous Q12H   Continuous Infusions: . sodium chloride    . cefTRIAXone (ROCEPHIN)  IV Stopped (07/18/16 0000)   PRN Meds: sodium chloride, acetaminophen, diphenhydrAMINE, [DISCONTINUED] levalbuterol **OR** ipratropium, levalbuterol, LORazepam, sodium chloride flush   Vital Signs    Vitals:   07/17/16 2200 07/17/16 2334 07/18/16 0302 07/18/16 0547  BP: (!) 129/59 (!) 156/68  140/60  Pulse: 76 80  71  Resp: 20   20  Temp: 98.2 F (36.8 C)   98 F (36.7 C)  TempSrc: Oral   Oral  SpO2: 94%   97%  Weight:   120 lb 14.4 oz (54.8 kg)   Height:        Intake/Output Summary (Last 24 hours) at 07/18/16 0907 Last data filed at 07/18/16 0751  Gross per 24 hour  Intake              413 ml  Output              800 ml  Net             -387 ml   Filed Weights   07/16/16 0533 07/17/16 0627 07/18/16 0302  Weight: 122 lb (55.3 kg) 120 lb 8 oz (54.7 kg) 120 lb 14.4 oz (54.8 kg)    Telemetry    A-paced, HR in 60's to 70's.  - Personally  Reviewed  ECG   No new tracings.   Physical Exam   General: Well developed, well nourished Caucasian female appearing in no acute distress. Head: Normocephalic, atraumatic.  Neck: Supple without bruits, JVD at 8cm. Lungs:  Resp regular and unlabored, no active wheezing. Minimal rales at bases bilaterally.  Heart: RRR, S1, S2, no S3, S4, or murmur; no rub. Abdomen: Soft, non-tender, non-distended with normoactive bowel sounds. No hepatomegaly. No rebound/guarding. No obvious abdominal masses. Extremities: No clubbing, cyanosis, trace lower extremity edema. Distal pedal pulses are 2+ bilaterally. Neuro: Alert and oriented X 3. Moves all extremities spontaneously. Psych: Normal affect.  Labs    Chemistry Recent Labs Lab 07/15/16 0457 07/17/16 0454 07/18/16 0349  NA 136 136 138  K 4.0 4.6 3.6  CL 101 101 101  CO2 28 26 28   GLUCOSE 102* 233* 116*  BUN 13 21* 20  CREATININE 0.58 0.69 0.64  CALCIUM 8.4* 8.3* 8.5*  PROT  --   --  6.6  ALBUMIN  --   --  2.6*  AST  --   --  18  ALT  --   --  15  ALKPHOS  --   --  84  BILITOT  --   --  0.4  GFRNONAA >60 >60 >60  GFRAA >60 >60 >60  ANIONGAP 7 9 9      Hematology Recent Labs Lab 07/16/16 0204 07/16/16 2005 07/17/16 0454 07/18/16 0349  WBC 8.1  --  6.6 8.6  RBC 2.79*  --  3.42* 3.13*  HGB 7.2* 9.1* 9.3* 8.6*  HCT 24.3* 29.9* 29.7* 27.1*  MCV 87.1  --  86.8 86.6  MCH 25.8*  --  27.2 27.5  MCHC 29.6*  --  31.3 31.7  RDW 14.7  --  14.5 14.7  PLT 231  --  240 271    Cardiac Enzymes Recent Labs Lab 07/12/16 2235 07/13/16 0528 07/13/16 1213 07/13/16 1646  TROPONINI 1.11* 1.13* 0.93* 0.86*   No results for input(s): TROPIPOC in the last 168 hours.   BNP Recent Labs Lab 07/16/16 1452 07/18/16 0349  BNP 1,452.2* 841.4*     DDimer No results for input(s): DDIMER in the last 168 hours.   Radiology    Dg Chest Port 1v Same Day  Result Date: 07/16/2016 CLINICAL DATA:  Shortness of breath EXAM: PORTABLE CHEST  1 VIEW COMPARISON:  Chest x-ray of 07/12/2016 FINDINGS: There has been significant worsening of patchy airspace disease throughout the lungs right greater than left most consistent with multifocal pneumonia. No pleural effusion is noted. Cardiomegaly is stable and pacer wires remain. The bones are osteopenic. IMPRESSION: Significant worsening of patchy airspace disease bilaterally most consistent with multifocal pneumonia. Electronically Signed   By: Ivar Drape M.D.   On: 07/16/2016 10:57    Cardiac Studies   Echocardiogram:  07/14/2016 Study Conclusions  - Left ventricle: There is possible apical and apical inferolateral, apical anterior and apical lateral HK but endocardial segments poorly visualized. recommend limited echo with definity contrast. The cavity size was mildly dilated. Systolic function was normal. Features are consistent with a pseudonormal left ventricular filling pattern, with concomitant abnormal relaxation and increased filling pressure (grade 2 diastolic dysfunction). Doppler parameters are consistent with high ventricular filling pressure. - Mitral valve: There was trivial regurgitation. - Left atrium: The atrium was mildly dilated. Anterior-posterior dimension: 41 mm. - Pulmonary arteries: PA peak pressure: 50 mm Hg (S). - Recommendations: Recommend limite echo with definity contrast to assess wall motion.  Impressions:  - The right ventricular systolic pressure was increased consistent with moderate pulmonary hypertension.  Limited Echo: 07/17/2016 Study Conclusions  - Left ventricle: The cavity size was normal. Wall thickness was   normal. The estimated ejection fraction was 50%. Hypokinesis of   the apical anterior wall, apical septal wall, and true apex. - Mitral valve: Mildly calcified annulus. Mildly calcified leaflets   . There was trivial regurgitation. - Left atrium: The atrium was mildly dilated. - Right ventricle: The  cavity size was normal. Pacer wire or   catheter noted in right ventricle. Systolic function was normal.  Impressions:  - Limited echo for wall motion. Definity contrast given.   Patient Profile     81 y.o. female with PMH of no prior CAD (cath 1995 with no CAD, EF 55%, nonischemic nuc 2013), HTN, hypothyroidism, chronic anemia, paroxysmal atrial fib, syncope, SDH after a fall, symptomatic bradycardia (s/p PPM with RV lead replacement), and hx of CVA who was transferred to  [facility] from [facility] on 07/13/16 for worsening SOB, possible multifocal PNA and elevated troponin.  Assessment & Plan    1. Elevated troponin - cyclic troponin values this admission have been at 1.11, 1.13, 0.93, and 0.86.  - believed to be demand ischemia with pneumonia, however Troponin up to 1.1, however stress test abnormal yesterday with a small apical scar and peri-infarct ischemia - we will plan for a LHC today   2. Acute Diastolic CHF - limited echo completed on 4/25 and showed an EF of 50% with hypokinesis of the apical anterior wall, apical septal wall, and true apex. - BNP elevated to 1452 on 4/24 --> improved to 841 this AM.  - she appears euvolemic, I will switch to lasix 20 mg po daily   3. Multifocal PNA  - per admitting team.   4. Normocytic Anemia - Hgb at 8.6 this AM. Received 1 unit pRBCs on 4/24. - she denies any evidence of active bleeding. FOB negative.   - per admitting team  5. PAF  - maintaining NSR. Not on [facility] given history of recurrent falls, syncope, SDH, and now anemia. CHADSVASC of at least 6 (HTN, Age, F sex, CVA). Continue Pindolol 5mg  BID for rate-control.  6. Symptomatic bradycardia - s/p PPM with lead revision in 12/2012 - followed by Dr. Lovena Le. Last interrogated 07/01/16 and device functioning well with good battery life.   7. Pulmonary HTN  - PA pressure 50 on echo this admission. Follow clinically.   Ena Dawley, MD 07/18/2016

## 2016-07-19 NOTE — Interval H&P Note (Signed)
History and Physical Interval Note:  07/19/2016 2:30 PM  Meredith Gonzalez  has presented today for surgery, with the diagnosis of cp  The various methods of treatment have been discussed with the patient and family. After consideration of risks, benefits and other options for treatment, the patient has consented to  Procedure(s): Left Heart Cath and Coronary Angiography (N/A) as a surgical intervention .  The patient's history has been reviewed, patient examined, no change in status, stable for surgery.  I have reviewed the patient's chart and labs.  Questions were answered to the patient's satisfaction.    Cath Lab Visit (complete for each Cath Lab visit)  Clinical Evaluation Leading to the Procedure:   ACS: Yes.    Non-ACS:    Anginal Classification: CCS III  Anti-ischemic medical therapy: No Therapy  Non-Invasive Test Results: Intermediate-risk stress test findings: cardiac mortality 1-3%/year  Prior CABG: No previous CABG       [name] 07/19/2016 2:30 PM

## 2016-07-19 NOTE — Progress Notes (Signed)
Pt transported to cath lab.  

## 2016-07-19 NOTE — Progress Notes (Addendum)
PROGRESS NOTE        PATIENT DETAILS Name: Meredith Gonzalez Age: 81 y.o. Sex: female Date of Birth: May 21, 1935 Admit Date: 07/12/2016 Admitting Physician Vianne Bulls, MD [id number], Cecille Rubin, MD  Brief Narrative:  Patient is a 81 y.o.femalewith history of CAD status post CABG, hypertension, depression, prior history of CVA with no major residual effects presented to the hospital with worsening shortness of breath, found to have multifocal pneumonia and admitted to the hospitalist service for further evaluation and treatment. Further evaluation revealed a significantly positive troponins, underwent nuclear stress test on 4/26, and was positive for ischemia, cardiology planning on LHC today. Hospital course was also complicated by development of acute hypoxemic respiratory failure thought to be due to acute diastolic heart failure. See below for further details  Subjective: Lying comfortably in bed this morning-claims that she ambulated in the room without any shortness of breath. However when she walked with physical therapy later this morning-she was found to be hypoxic with ambulation.  Denies any chest pain No nausea vomiting or diarrhea  Telemetry (personally reviewed): Normal sinus rhythm  Assessment/Plan: Acute hypoxemic respiratory failure: Likely multifactorial-with pneumonia and acute diastolic heart failure probably contributing. Improved with diuretics, and empiric antimicrobial therapy. However she still has hypoxemia on exertion-we will repeat chest x-ray tomorrow morning. Volumes status appears stable on exam.  Community acquired pneumonia: Clinically improved-no fever or leukocytosis. Has completed a course of antimicrobial therapy, blood cultures, respiratory virus panel negative. Suspect this CHF to be part of the etiology for her dyspnea/respiratory failure. Although she was suspected of having pneumonia, her pro-calcitonin levels were surprisingly  within normal limits. Although improved-and not hypoxic at rest-she still has hypoxia on exertion-we will repeat chest x-ray tomorrow morning.   Acute diastolic heart failure: Volume status seems stable-she appears compensated. Patient developed worsening shortness of breath on 4/24-BNP elevated-she was given multiple doses of Lasix with significant improvement. Monitor off diuretics-repeating chest x-ray tomorrow morning.   Elevated troponins:Likely secondary to demand ischemia-(although patient had chest pain a few days prior to this admission). Evaluated by cardiology, underwent nuclear stress test on 4/26, positive for ischemia-discussed with cardiology earlier this morning, plans are for [facility] later today.  Chronic normocytic Anemia: Suspect multifactorial etiology-likely has chronic vitamin B12 and chronic iron deficiency. Currently on supplementation with both oral iron and parenteral vitamin B12. FOBT negative, hemoglobin stable after 1 unit of PRBC on 4/24. Since anemia appears chronic, suspect further evaluation can be done in the outpatient setting.  History of CAD: Continue aspirin and beta blocker-see above regarding positive troponins. LDL 65.  History of hypertension:Controlled, continue pindolol  Depression:Appears to be stable, continue Elavil and Prozac.  History of atrial fibrillation:Not a anticoagulation candidate per outpatient cardiology note (personally reviewed)  History of symptomatic bradycardia with syncope-status post permanent pacemaker implantation  DVT Prophylaxis: Prophylactic Lovenox   Code Status: Full code   Family Communication: None at bedside-but spoke at length with son yesterday  Disposition Plan: Remain inpatient-home when work up complete  Antimicrobial agents: Anti-infectives    Start     Dose/Rate Route Frequency Ordered Stop   07/14/16 2100  azithromycin (ZITHROMAX) tablet 500 mg     500 mg Oral Daily 07/14/16 1002 07/18/16 2151     07/13/16 2200  cefTRIAXone (ROCEPHIN) 1 g in dextrose 5 % 50 mL IVPB  1 g 100 mL/hr over 30 Minutes Intravenous Every 24 hours 07/13/16 0407 07/19/16 0744   07/13/16 2100  azithromycin (ZITHROMAX) 500 mg in dextrose 5 % 250 mL IVPB  Status:  Discontinued     500 mg 250 mL/hr over 60 Minutes Intravenous Every 24 hours 07/13/16 0407 07/14/16 1002   07/12/16 2319  azithromycin (ZITHROMAX) 500 MG injection    Comments:  Davis, Misty   : cabinet override      07/12/16 2319 07/13/16 1129   07/12/16 2315  cefTRIAXone (ROCEPHIN) 1 g in dextrose 5 % 50 mL IVPB     1 g 100 mL/hr over 30 Minutes Intravenous  Once 07/12/16 2300 07/13/16 0101   07/12/16 2315  azithromycin (ZITHROMAX) 500 mg in dextrose 5 % 250 mL IVPB     500 mg 250 mL/hr over 60 Minutes Intravenous  Once 07/12/16 2300 07/13/16 0204      Procedures: None  CONSULTS:  None  Time spent: 25- minutes-Greater than 50% of this time was spent in counseling, explanation of diagnosis, planning of further management, and coordination of care.  MEDICATIONS: Scheduled Meds: . acetaminophen  500 mg Oral Daily  . amitriptyline  25 mg Oral QHS  . aspirin EC  81 mg Oral Daily  . calcium-vitamin D  1 tablet Oral BID WC  . cyanocobalamin  1,000 mcg Subcutaneous Daily  . [START ON 07/20/2016] enoxaparin (LOVENOX) injection  40 mg Subcutaneous Q24H  . ferrous sulfate  325 mg Oral BID WC  . FLUoxetine  60 mg Oral Daily  . gabapentin  300 mg Oral q morning - 10a  . gabapentin  600 mg Oral QHS  . guaiFENesin  1,200 mg Oral BID  . multivitamin with minerals  1 tablet Oral Daily  . pindolol  5 mg Oral BID  . sodium chloride flush  3 mL Intravenous Q12H   Continuous Infusions: . sodium chloride     PRN Meds:.sodium chloride, acetaminophen, diphenhydrAMINE, [DISCONTINUED] levalbuterol **OR** ipratropium, levalbuterol, LORazepam, sodium chloride flush   PHYSICAL EXAM: Vital signs: Vitals:   07/18/16 1228 07/18/16 1953 07/19/16 0535  07/19/16 1059  BP: (!) 150/66 (!) 157/67 135/65 (!) 145/68  Pulse: 76 (!) 102 79 74  Resp:  18 18   Temp:  98.2 F (36.8 C) 98.3 F (36.8 C)   TempSrc:  Oral Oral   SpO2:  96% 95%   Weight:   53.7 kg (118 lb 4.8 oz)   Height:       Filed Weights   07/17/16 0627 07/18/16 0302 07/19/16 0535  Weight: 54.7 kg (120 lb 8 oz) 54.8 kg (120 lb 14.4 oz) 53.7 kg (118 lb 4.8 oz)   Body mass index is 19.09 kg/m.   General appearance :Awake, alert, not in any distress.  Eyes:, pupils equally reactive to light and accomodation,no scleral icterus. HEENT: Atraumatic and Normocephalic Neck: supple, no JVD. Resp:Good air entry bilaterally-no rales CVS: S1 S2 regular, no murmurs.  GI: Bowel sounds present, Non tender and not distended with no gaurding, rigidity or rebound. Extremities: B/L Lower Ext shows no edema, both legs are warm to touch Neurology:  speech clear,Non focal, sensation is grossly intact. Psychiatric: Normal judgment and insight. Normal mood. Musculoskeletal:No digital cyanosis Skin:No Rash, warm and dry Wounds:N/A  I have personally reviewed following labs and imaging studies  LABORATORY DATA: CBC:  Recent Labs Lab 07/15/16 0457 07/16/16 0204 07/16/16 2005 07/17/16 0454 07/18/16 0349 07/19/16 0355  WBC 7.5 8.1  --  6.6 8.6  8.5  HGB 7.6* 7.2* 9.1* 9.3* 8.6* 8.8*  HCT 25.8* 24.3* 29.9* 29.7* 27.1* 29.2*  MCV 87.5 87.1  --  86.8 86.6 86.9  PLT 233 231  --  240 271 193    Basic Metabolic Panel:  Recent Labs Lab 07/14/16 0440 07/15/16 0457 07/17/16 0454 07/18/16 0349 07/19/16 0355  NA 138 136 136 138 140  K 4.0 4.0 4.6 3.6 4.1  CL 102 101 101 101 102  CO2 28 28 26 28 30   GLUCOSE 98 102* 233* 116* 146*  BUN 17 13 21* 20 17  CREATININE 0.65 0.58 0.69 0.64 0.67  CALCIUM 8.3* 8.4* 8.3* 8.5* 8.6*    GFR: Estimated Creatinine Clearance: 49.1 mL/min (by C-G formula based on SCr of 0.67 mg/dL).  Liver Function Tests:  Recent Labs Lab 07/18/16 0349    AST 18  ALT 15  ALKPHOS 84  BILITOT 0.4  PROT 6.6  ALBUMIN 2.6*   No results for input(s): LIPASE, AMYLASE in the last 168 hours. No results for input(s): AMMONIA in the last 168 hours.  Coagulation Profile: No results for input(s): INR, PROTIME in the last 168 hours.  Cardiac Enzymes:  Recent Labs Lab 07/12/16 2235 07/13/16 0528 07/13/16 1213 07/13/16 1646  TROPONINI 1.11* 1.13* 0.93* 0.86*    BNP (last 3 results) No results for input(s): PROBNP in the last 8760 hours.  HbA1C: No results for input(s): HGBA1C in the last 72 hours.  CBG:  Recent Labs Lab 07/13/16 2115 07/14/16 0734 07/14/16 1209 07/14/16 1620 07/18/16 0741  GLUCAP 168* 101* 100* 93 90    Lipid Profile:  Recent Labs  07/18/16 0349  CHOL 118  HDL 44  LDLCALC 65  TRIG 44  CHOLHDL 2.7    Thyroid Function Tests: No results for input(s): TSH, T4TOTAL, FREET4, T3FREE, THYROIDAB in the last 72 hours.  Anemia Panel: No results for input(s): VITAMINB12, FOLATE, FERRITIN, TIBC, IRON, RETICCTPCT in the last 72 hours.  Urine analysis: No results found for: COLORURINE, APPEARANCEUR, LABSPEC, PHURINE, GLUCOSEU, HGBUR, BILIRUBINUR, KETONESUR, PROTEINUR, UROBILINOGEN, NITRITE, LEUKOCYTESUR  Sepsis Labs: Lactic Acid, Venous    Component Value Date/Time   LATICACIDVEN 0.93 07/12/2016 2243    MICROBIOLOGY: Recent Results (from the past 240 hour(s))  Blood culture (routine x 2)     Status: None   Collection Time: 07/12/16 11:53 PM  Result Value Ref Range Status   Specimen Description BLOOD RIGHT ARM  Final   Special Requests   Final    BOTTLES DRAWN AEROBIC AND ANAEROBIC Blood Culture adequate volume   Culture   Final    NO GROWTH 5 DAYS Performed at [facility], [place] [address] [place], [place] [address]    Report Status 07/18/2016 FINAL  Final  Blood culture (routine x 2)     Status: None   Collection Time: 07/13/16 12:02 AM  Result Value Ref Range Status   Specimen  Description BLOOD RIGHT ARM  Final   Special Requests   Final    BOTTLES DRAWN AEROBIC AND ANAEROBIC Blood Culture adequate volume   Culture   Final    NO GROWTH 5 DAYS Performed at [facility], [place] [address], [place],  [address]    Report Status 07/18/2016 FINAL  Final  Respiratory Panel by PCR     Status: None   Collection Time: 07/16/16  4:56 PM  Result Value Ref Range Status   Adenovirus NOT DETECTED NOT DETECTED Final   Coronavirus 229E NOT DETECTED NOT  DETECTED Final   Coronavirus HKU1 NOT DETECTED NOT DETECTED Final   Coronavirus NL63 NOT DETECTED NOT DETECTED Final   Coronavirus OC43 NOT DETECTED NOT DETECTED Final   Metapneumovirus NOT DETECTED NOT DETECTED Final   Rhinovirus / Enterovirus NOT DETECTED NOT DETECTED Final   Influenza A NOT DETECTED NOT DETECTED Final   Influenza B NOT DETECTED NOT DETECTED Final   Parainfluenza Virus 1 NOT DETECTED NOT DETECTED Final   Parainfluenza Virus 2 NOT DETECTED NOT DETECTED Final   Parainfluenza Virus 3 NOT DETECTED NOT DETECTED Final   Parainfluenza Virus 4 NOT DETECTED NOT DETECTED Final   Respiratory Syncytial Virus NOT DETECTED NOT DETECTED Final   Bordetella pertussis NOT DETECTED NOT DETECTED Final   Chlamydophila pneumoniae NOT DETECTED NOT DETECTED Final   Mycoplasma pneumoniae NOT DETECTED NOT DETECTED Final    RADIOLOGY STUDIES/RESULTS: Dg Chest 2 View  Result Date: 07/12/2016 CLINICAL DATA:  Respiratory difficulty. Shortness of breath. Previous smoker. History of pneumonia and bronchitis. EXAM: CHEST  2 VIEW COMPARISON:  05/01/2015 FINDINGS: Cardiac pacemaker. Shallow inspiration. Perihilar airspace and interstitial changes bilaterally. This is new since previous study. Peribronchial thickening. Changes likely to represent multifocal pneumonia. Normal heart size. No blunting of costophrenic angles. No pneumothorax. Calcified and tortuous aorta. Old fracture deformity of the right proximal humerus. Inferior  vena caval filter. IMPRESSION: Bronchial wall thickening with perihilar interstitial and airspace disease new since previous study. Changes likely to represent multifocal pneumonia. Electronically Signed   By: Lucienne Capers M.D.   On: 07/12/2016 22:55   Nm Myocar Multi W/spect W/wall Motion / Ef  Result Date: 07/18/2016 CLINICAL DATA:  Chest pain EXAM: MYOCARDIAL IMAGING WITH SPECT (REST AND PHARMACOLOGIC-STRESS) GATED LEFT VENTRICULAR WALL MOTION STUDY LEFT VENTRICULAR EJECTION FRACTION TECHNIQUE: Standard myocardial SPECT imaging was performed after resting intravenous injection of 10 mCi Tc-38m tetrofosmin. Subsequently, intravenous infusion of Lexiscan was performed under the supervision of the Cardiology staff. At peak effect of the drug, 30 mCi Tc-9m tetrofosmin was injected intravenously and standard myocardial SPECT imaging was performed. Quantitative gated imaging was also performed to evaluate left ventricular wall motion, and estimate left ventricular ejection fraction. COMPARISON:  None. FINDINGS: Perfusion: There is a fixed defects noted in the anteroapical region. Reversibility is also noted in the adjacent anterior and posterior walls compatible with peri-infarct ischemia. Wall Motion: Generalized hypokinesia, most pronounced in the anteroapical region. Left Ventricular Ejection Fraction: 38 % End diastolic volume 638 ml End systolic volume 77 ml IMPRESSION: 1. Area of old infarction scar in the anteroapical segment with peri-infarct ischemia noted, most pronounced in the anterior wall but also noted in the posterior wall. 2. Diffuse generalized hypokinesia, most severe in the anteroapical wall. 3. Left ventricular ejection fraction 38% 4. Non invasive risk stratification*: High *2012 Appropriate Use Criteria for Coronary Revascularization Focused Update: J Am Coll Cardiol. 4665;99(3):570-177. http://content.airportbarriers.com.aspx?articleid=[email or web address] Electronically Signed   By: Rolm Baptise  M.D.   On: 07/18/2016 16:12   Dg Chest Port 1v Same Day  Result Date: 07/16/2016 CLINICAL DATA:  Shortness of breath EXAM: PORTABLE CHEST 1 VIEW COMPARISON:  Chest x-ray of 07/12/2016 FINDINGS: There has been significant worsening of patchy airspace disease throughout the lungs right greater than left most consistent with multifocal pneumonia. No pleural effusion is noted. Cardiomegaly is stable and pacer wires remain. The bones are osteopenic. IMPRESSION: Significant worsening of patchy airspace disease bilaterally most consistent with multifocal pneumonia. Electronically Signed   By: Ivar Drape M.D.   On: 07/16/2016 10:57  LOS: 6 days   Oren Binet, MD  [facility] Pager:[phone]  If 7PM-7AM, please contact night-coverage [email or web address] Password [email or web address] 07/19/2016, 1:29 PM

## 2016-07-19 NOTE — Progress Notes (Signed)
Physical Therapy Treatment Patient Details Name: Meredith Gonzalez MRN: [id number] DOB: 12/31/1935 Today's Date: 07/19/2016    History of Present Illness Pt is a 81 y/o female admitted secondary to CAP. PMH including but not limited to CAD s/p CABG, HTN, CVA, DM and pacemaker insertion.    PT Comments    Patient with 3/4 DOE with ambulation and stair negotiation this session. SpO2 between 89-76% on RA throughout session. Pt educated to perform pursed lip breathing and able to bring SpO2 up with several standing rest breaks. Pt still reluctant to use AD at all times however does verbalize understanding need for assistance with balance and energy conservation.    Follow Up Recommendations  Home health PT;Supervision - Intermittent     Equipment Recommendations  None recommended by PT (pt has RW and rollator)    Recommendations for Other Services       Precautions / Restrictions Precautions Precautions: Fall Restrictions Weight Bearing Restrictions: No    Mobility  Bed Mobility Overal bed mobility: Independent Bed Mobility: Supine to Sit;Sit to Supine     Supine to sit: HOB elevated        Transfers Overall transfer level: Needs assistance Equipment used: Rolling walker (2 wheeled) Transfers: Sit to/from Stand Sit to Stand: Supervision         General transfer comment: supervision for safety  Ambulation/Gait Ambulation/Gait assistance: Min guard Ambulation Distance (Feet): 150 Feet (with rest breaks) Assistive device: Rolling walker (2 wheeled) Gait Pattern/deviations: Step-through pattern;Decreased stride length Gait velocity: decreased   General Gait Details: SpO2 89% on RA prior to mobility and down to 76% with mobility; pt educated on pursed lip breathing; cues for cadence and self monitoring for rest breaks; pt with 3/4 DOE    Stairs Stairs: Yes   Stair Management: Two rails;Forwards;Step to pattern   General stair comments: cues for energy conservation    Wheelchair Mobility    Modified Rankin (Stroke Patients Only)       Balance Overall balance assessment: Needs assistance;History of Falls Sitting-balance support: Feet supported Sitting balance-Leahy Scale: Good     Standing balance support: During functional activity;Single extremity supported Standing balance-Leahy Scale: Fair                              Cognition Arousal/Alertness: Awake/alert Behavior During Therapy: WFL for tasks assessed/performed Overall Cognitive Status: Within Functional Limits for tasks assessed                                        Exercises      General Comments        Pertinent Vitals/Pain Pain Assessment: No/denies pain    Home Living                      Prior Function            PT Goals (current goals can now be found in the care plan section) Acute Rehab PT Goals Patient Stated Goal: return home PT Goal Formulation: With patient Time For Goal Achievement: 07/27/16 Potential to Achieve Goals: Good Progress towards PT goals: Progressing toward goals    Frequency    Min 3X/week      PT Plan Current plan remains appropriate    Co-evaluation             End  of Session Equipment Utilized During Treatment: Gait belt Activity Tolerance: Patient tolerated treatment well Patient left: with call bell/phone within reach;in bed Nurse Communication: Mobility status PT Visit Diagnosis: Unsteadiness on feet (R26.81);History of falling (Z91.81)     Time: 1610-9604 PT Time Calculation (min) (ACUTE ONLY): 24 min  Charges:  $Gait Training: 8-22 mins $Therapeutic Activity: 8-22 mins                    G Codes:       Meredith Gonzalez, PTA Pager: [phone]     Meredith Gonzalez 07/19/2016, 9:53 AM

## 2016-07-19 NOTE — Progress Notes (Signed)
TR BAND REMOVAL  LOCATION:    right radial  DEFLATED PER PROTOCOL:    Yes.    TIME BAND OFF / DRESSING APPLIED:    2055    SITE UPON ARRIVAL:    Level 0  SITE AFTER BAND REMOVAL:    Level 0  CIRCULATION SENSATION AND MOVEMENT:    Within Normal Limits   Yes.    COMMENTS:   Radial site teaching reinforced.

## 2016-07-20 ENCOUNTER — Inpatient Hospital Stay (HOSPITAL_COMMUNITY): Payer: Medicare Other

## 2016-07-20 DIAGNOSIS — R079 Chest pain, unspecified: Secondary | ICD-10-CM

## 2016-07-20 DIAGNOSIS — I200 Unstable angina: Secondary | ICD-10-CM

## 2016-07-20 DIAGNOSIS — I214 Non-ST elevation (NSTEMI) myocardial infarction: Secondary | ICD-10-CM

## 2016-07-20 DIAGNOSIS — Z955 Presence of coronary angioplasty implant and graft: Secondary | ICD-10-CM

## 2016-07-20 LAB — CBC
HCT: 29.8 % — ABNORMAL LOW (ref 36.0–46.0)
HEMOGLOBIN: 9.3 g/dL — AB (ref 12.0–15.0)
MCH: 27.2 pg (ref 26.0–34.0)
MCHC: 31.2 g/dL (ref 30.0–36.0)
MCV: 87.1 fL (ref 78.0–100.0)
Platelets: 319 10*3/uL (ref 150–400)
RBC: 3.42 MIL/uL — AB (ref 3.87–5.11)
RDW: 15.1 % (ref 11.5–15.5)
WBC: 8 10*3/uL (ref 4.0–10.5)

## 2016-07-20 LAB — BASIC METABOLIC PANEL
ANION GAP: 7 (ref 5–15)
BUN: 12 mg/dL (ref 6–20)
CHLORIDE: 105 mmol/L (ref 101–111)
CO2: 27 mmol/L (ref 22–32)
Calcium: 8.2 mg/dL — ABNORMAL LOW (ref 8.9–10.3)
Creatinine, Ser: 0.6 mg/dL (ref 0.44–1.00)
GFR calc Af Amer: >60 mL/min (ref >60)
Glucose, Bld: 94 mg/dL (ref 65–99)
POTASSIUM: 3.9 mmol/L (ref 3.5–5.1)
Sodium: 139 mmol/L (ref 135–145)

## 2016-07-20 LAB — PROCALCITONIN: Procalcitonin: <0.1 ng/mL

## 2016-07-20 MED ORDER — LOSARTAN POTASSIUM 25 MG PO TABS: 25.0000 mg | ORAL_TABLET | Freq: Every day | ORAL | 0 refills | Status: DC

## 2016-07-20 MED ORDER — PANTOPRAZOLE SODIUM 40 MG PO TBEC: 40.0000 mg | DELAYED_RELEASE_TABLET | Freq: Every day | ORAL | 3 refills | Status: DC

## 2016-07-20 MED ORDER — FUROSEMIDE 20 MG PO TABS: 20.0000 mg | ORAL_TABLET | Freq: Every day | ORAL | 0 refills | Status: DC

## 2016-07-20 MED ORDER — ATORVASTATIN CALCIUM 20 MG PO TABS: 20.0000 mg | ORAL_TABLET | Freq: Every day | ORAL | 3 refills | Status: DC

## 2016-07-20 MED ORDER — FUROSEMIDE 10 MG/ML IJ SOLN
40.0000 mg | Freq: Once | INTRAMUSCULAR | Status: AC
  Administered 2016-07-20: 09:00:00 40 mg via INTRAVENOUS

## 2016-07-20 MED ORDER — LOSARTAN POTASSIUM 50 MG PO TABS: 25.0000 mg | ORAL_TABLET | Freq: Every day | ORAL | Status: DC

## 2016-07-20 MED ORDER — VITAMIN B-12 1000 MCG PO TABS: 1000.0000 ug | ORAL_TABLET | Freq: Every day | ORAL | 0 refills | Status: DC

## 2016-07-20 MED ORDER — FERROUS SULFATE 325 (65 FE) MG PO TABS: 325.0000 mg | ORAL_TABLET | Freq: Two times a day (BID) | ORAL | 0 refills | Status: DC

## 2016-07-20 MED ORDER — CLOPIDOGREL BISULFATE 75 MG PO TABS: 75.0000 mg | ORAL_TABLET | Freq: Every day | ORAL | 3 refills | Status: DC

## 2016-07-20 MED ORDER — FUROSEMIDE 10 MG/ML IJ SOLN
INTRAMUSCULAR | Status: AC
  Filled 2016-07-20: qty 2

## 2016-07-20 NOTE — Progress Notes (Addendum)
CARDIAC REHAB PHASE I   PRE:  Rate/Rhythm: 76 A Paced  BP:  Supine: 153/53 Sitting:   Standing:    SaO2: 93% RA  MODE:  Ambulation: 500 ft   POST:  Rate/Rhythm: 93 A paced  BP:  Supine:   Sitting: 156/48  Standing:    SaO2: 84% increased to 91% RA  9458-5929 Pt tolerated ambulation well with assist x1 and pushing rolling walker. Gait slow, steady, mild SOB with ambulation. SaO2 post ambulation dropped to 84% RA. SaO2 recovered slowly with pursed-lip breathing up to 91% RA. Informed patient's nurse about pt's drop in oxygen saturation. MI/PCI education complete including restrictions, risk factors, CP, NTG use, and calling 911, stent card and MI book reviewed and given, exercise guidelines given. Reviewed CHF education including daily weights, low sodium diet and when to call physician/911. Pt verbalizes understanding of instructions given. Discussed Phase 2 cardiac rehab and pt is interested. Permission given to send contact information to program at [place], [place], [facility]

## 2016-07-20 NOTE — Progress Notes (Signed)
Patient Name: Meredith Gonzalez Date of Encounter: 07/20/2016  Primary Cardiologist: Dr. Harrington Challenger Primary Electrophysiologist: Dr. Thayer Ohm Problem List     Principal Problem:   Multifocal pneumonia Active Problems:   Recurrent falls   Syncope   SDH (subdural hematoma) (HCC)   Anemia   Depression   Hypertension   Diabetes ([place])   Pacemaker   Elevated troponin   Leukocytosis   SOB (shortness of breath)   Abnormal stress test   Chest pain   NSTEMI (non-ST elevated myocardial infarction) [facility])   Status post coronary artery stent placement     Subjective   Feeling fine. No chest pain. Going for CXR.   Inpatient Medications    Scheduled Meds: . acetaminophen  500 mg Oral Daily  . amitriptyline  25 mg Oral QHS  . aspirin EC  81 mg Oral Daily  . atorvastatin  20 mg Oral q1800  . calcium-vitamin D  1 tablet Oral BID WC  . clopidogrel  75 mg Oral Q breakfast  . cyanocobalamin  1,000 mcg Subcutaneous Daily  . enoxaparin (LOVENOX) injection  30 mg Subcutaneous Q24H  . ferrous sulfate  325 mg Oral BID WC  . FLUoxetine  60 mg Oral Daily  . furosemide      . furosemide      . gabapentin  300 mg Oral q morning - 10a  . gabapentin  600 mg Oral QHS  . guaiFENesin  1,200 mg Oral BID  . multivitamin with minerals  1 tablet Oral Daily  . pindolol  5 mg Oral BID  . sodium chloride flush  3 mL Intravenous Q12H   Continuous Infusions:  PRN Meds: acetaminophen, diphenhydrAMINE, guaiFENesin-dextromethorphan, [DISCONTINUED] levalbuterol **OR** ipratropium, levalbuterol, LORazepam, sodium chloride flush   Vital Signs    Vitals:   07/19/16 2000 07/19/16 2100 07/20/16 0406 07/20/16 0700  BP: (!) 170/74 (!) 171/71 (!) 151/70 138/79  Pulse: 80 82 82 81  Resp: 15 20 14 19   Temp:   97.8 F (36.6 C) 98.1 F (36.7 C)  TempSrc:   Axillary Oral  SpO2: 98% 100% 95% 92%  Weight:   126 lb 3.2 oz (57.2 kg)   Height:        Intake/Output Summary (Last 24 hours) at 07/20/16  0939 Last data filed at 07/20/16 0730  Gross per 24 hour  Intake           762.07 ml  Output              950 ml  Net          -187.93 ml   Filed Weights   07/18/16 0302 07/19/16 0535 07/20/16 0406  Weight: 120 lb 14.4 oz (54.8 kg) 118 lb 4.8 oz (53.7 kg) 126 lb 3.2 oz (57.2 kg)    Physical Exam   GEN: Well nourished, well developed, in no acute distress.  HEENT: Grossly normal.  Neck: Supple, no JVD, carotid bruits, or masses. Cardiac: RRR, no murmurs, rubs, or gallops. No clubbing, cyanosis, edema.  Radials/DP/PT 2+ and equal bilaterally.  Respiratory:  Mild crackles at bases GI: Soft, nontender, nondistended, BS + x 4. MS: no deformity or atrophy. Skin: warm and dry, no rash. Neuro:  Strength and sensation are intact. Psych: AAOx3.  Normal affect. 2 Labs    CBC  Recent Labs  07/19/16 0355 07/20/16 0439  WBC 8.5 8.0  HGB 8.8* 9.3*  HCT 29.2* 29.8*  MCV 86.9 87.1  PLT 326 319   Basic  Metabolic Panel  Recent Labs  07/19/16 0355 07/20/16 0439  NA 140 139  K 4.1 3.9  CL 102 105  CO2 30 27  GLUCOSE 146* 94  BUN 17 12  CREATININE 0.67 0.60  CALCIUM 8.6* 8.2*   Liver Function Tests  Recent Labs  07/18/16 0349  AST 18  ALT 15  ALKPHOS 84  BILITOT 0.4  PROT 6.6  ALBUMIN 2.6*   No results for input(s): LIPASE, AMYLASE in the last 72 hours. Cardiac Enzymes No results for input(s): CKTOTAL, CKMB, CKMBINDEX, TROPONINI in the last 72 hours. BNP Invalid input(s): POCBNP D-Dimer No results for input(s): DDIMER in the last 72 hours. Hemoglobin A1C No results for input(s): HGBA1C in the last 72 hours. Fasting Lipid Panel  Recent Labs  07/18/16 0349  CHOL 118  HDL 44  LDLCALC 65  TRIG 44  CHOLHDL 2.7   Thyroid Function Tests No results for input(s): TSH, T4TOTAL, T3FREE, THYROIDAB in the last 72 hours.  Invalid input(s): FREET3  Telemetry    NSR, paced - Personally Reviewed  ECG    Atrial-paced rhythm with prolonged AV conduction, IRBBB,  LAFB - Personally Reviewed  Radiology    Nm Myocar Multi W/spect W/wall Motion / Ef  Result Date: 07/18/2016 CLINICAL DATA:  Chest pain EXAM: MYOCARDIAL IMAGING WITH SPECT (REST AND PHARMACOLOGIC-STRESS) GATED LEFT VENTRICULAR WALL MOTION STUDY LEFT VENTRICULAR EJECTION FRACTION TECHNIQUE: Standard myocardial SPECT imaging was performed after resting intravenous injection of 10 mCi Tc-59m tetrofosmin. Subsequently, intravenous infusion of Lexiscan was performed under the supervision of the Cardiology staff. At peak effect of the drug, 30 mCi Tc-20m tetrofosmin was injected intravenously and standard myocardial SPECT imaging was performed. Quantitative gated imaging was also performed to evaluate left ventricular wall motion, and estimate left ventricular ejection fraction. COMPARISON:  None. FINDINGS: Perfusion: There is a fixed defects noted in the anteroapical region. Reversibility is also noted in the adjacent anterior and posterior walls compatible with peri-infarct ischemia. Wall Motion: Generalized hypokinesia, most pronounced in the anteroapical region. Left Ventricular Ejection Fraction: 38 % End diastolic volume 299 ml End systolic volume 77 ml IMPRESSION: 1. Area of old infarction scar in the anteroapical segment with peri-infarct ischemia noted, most pronounced in the anterior wall but also noted in the posterior wall. 2. Diffuse generalized hypokinesia, most severe in the anteroapical wall. 3. Left ventricular ejection fraction 38% 4. Non invasive risk stratification*: High *2012 Appropriate Use Criteria for Coronary Revascularization Focused Update: J Am Coll Cardiol. 3716;96(7):893-810. http://[email or web address]?[email or web address] Electronically Signed   By: Rolm Baptise M.D.   On: 07/18/2016 16:12    Cardiac Studies   Echocardiogram:  07/14/2016 Study Conclusions - Left ventricle: There is possible apical and apical inferolateral, apical anterior and apical lateral HK  but endocardial segments poorly visualized. recommend limited echo with definity contrast. The cavity size was mildly dilated. Systolic function was normal. Features are consistent with a pseudonormal left ventricular filling pattern, with concomitant abnormal relaxation and increased filling pressure (grade 2 diastolic dysfunction). Doppler parameters are consistent with high ventricular filling pressure. - Mitral valve: There was trivial regurgitation. - Left atrium: The atrium was mildly dilated. Anterior-posterior dimension: 41 mm. - Pulmonary arteries: PA peak pressure: 50 mm Hg (S). - Recommendations: Recommend limite echo with definity contrast to assess wall motion. Impressions: - The right ventricular systolic pressure was increased consistent with moderate pulmonary hypertension.   Limited Echo: 07/17/2016 Study Conclusions - Left ventricle: The cavity size was normal. Wall thickness was normal. The estimated ejection  fraction was 50%. Hypokinesis of the apical anterior wall, apical septal wall, and true apex. - Mitral valve: Mildly calcified annulus. Mildly calcified leaflets . There was trivial regurgitation. - Left atrium: The atrium was mildly dilated. - Right ventricle: The cavity size was normal. Pacer wire or catheter noted in right ventricle. Systolic function was normal. Impressions: - Limited echo for wall motion. Definity contrast given.    07/19/16 Coronary Stent Intervention  Left Heart Cath and Coronary Angiography  Conclusion    Prox RCA to Mid RCA lesion, 20 %stenosed.  There is moderate left ventricular systolic dysfunction.  LV end diastolic pressure is mildly elevated.  The left ventricular ejection fraction is 35-45% by visual estimate.  1st Diag lesion, 95 %stenosed.  A STENT PROMUS PREM MR 2.5X16 drug eluting stent was successfully placed.  Post intervention, there is a 0% residual stenosis.   1. Single  vessel occlusive CAD. 95% large diagonal branch 2. Moderate LV dysfunction. EF 35-40%. 3. Mildly elevated LVEDP 4. Successful stenting of the first diagonal with DES  Plan: DAPT for one year.     Patient Profile     81 y.o. female with PMH of no prior CAD (cath 1995 with no CAD, EF 55%, nonischemic nuc 2013), HTN, hypothyroidism, chronic anemia, paroxysmal atrial fib, syncope, SDH after a fall, symptomatic bradycardia (s/p PPM with RV lead replacement), and hx of CVA who was transferred [facility] on 07/13/16 for worsening SOB, possiblemultifocal PNA and elevated troponin.  Assessment & Plan    1. Elevated troponin/ CAD - cyclic troponin values this admission have been at 1.11, 1.13, 0.93, and 0.86.  - believed to be demand ischemia with pneumonia, however Troponin up to 1.1, however stress test abnormal with a small apical scar and peri-infarct ischemia - LHC yesterday showed 95% large diagonal branch s/p PCI/DES, mod LV dysfunction (EF 35-40%), mildly elevated LVEDP.  Plan is for DAPT with ASA/Plavix for at least 1 year.  - I have arranged a TOC follow up with Richardson Dopp PA-C on 07/30/16  2. Acute Diastolic CHF - limited echo completed on 4/25 and showed an EF of 50% with hypokinesis of the apical anterior wall, apical septal wall, and true apex. LV gram yesterday with EF 35-40%. - BNP elevated to 1452 on 4/24 --> improved to 841  - she appears euvolemic and was converted to lasix 20mg  daily yesterday. Mild crackles on exam and IM gave one dose of IV lasix this AM.   3. Multifocal PNA  - being treated per admitting team.   4. Normocytic Anemia - Hgb at 9.3 this AM. Received 1 unit pRBCs on 4/24. - she denies any evidence of active bleeding. FOB negative.    5. PAF  - maintainingNSR. Not on [facility] given history of recurrent falls, syncope, SDH, need for DAPT and now anemia. CHADSVASC of at least 6 (HTN, Age, F sex, CVA). Continue Pindolol 5mg  BID for rate-control.  6.  Symptomatic bradycardia - s/p PPM with lead revision in 12/2012 - followed by Dr. Lovena Le. Last interrogated 07/01/16 and device functioning well with good battery life.   7. Pulmonary HTN  - PA pressure 50 on echo this admission. Follow clinically.   Signed, Angelena Form, PA-C  07/20/2016, 9:39 AM   The patient was seen, examined and discussed with Lorretta Harp, PA-C and I agree with the above.    The patient underwent a PCI/DES to a large diagonal for a NSTEMI and abnormal stress test with ischemia  in the anterolateral wall.  She is feeling well this am, insertion site with no bleeding and good peripheral pulses. Hb stable at 9.3. We will discharge today, follow up scheduled with Richardson Dopp in 1 week. We will recheck BMP, CBC at that time. She will be discharged on ASA, Plavix, she has h/o PAF, on no AC as she is anemic and has h/o syncope with SDH. Continue pindolol, add losartan 25 mg po daily for LV dysfunction and hypertension.  Pneumonia therapy per primary team.

## 2016-07-20 NOTE — Discharge Summary (Signed)
Meredith Gonzalez STM:[id number] DOB: 1935/12/14 DOA: 79/20/2018  PCP: Meredith Ship, MD  Admit date: 07/12/2016  Discharge date: 07/20/2016  Admitted From: Home   Disposition:  Home   Recommendations for Outpatient Follow-up:   Follow up with PCP in 1-2 weeks  PCP Please obtain BMP/CBC, 2 view CXR in 1week,  (see Discharge instructions)   PCP Please follow up on the following pending results: Monitor, weight, BMP, B12 and Iron levels   Home Health:    Equipment/Devices: none Consultations: Cards Discharge Condition: Stable   CODE STATUS: Full   Diet Recommendation: Diet heart healthy/carb modified    Chief Complaint  Patient presents with  . Shortness of Breath     Brief history of present illness from the day of admission and additional interim summary     Patient is a 81 y.o.femalewith history of CAD status post CABG, hypertension, depression, prior history of CVA with no major residual effects presented to the hospital with worsening shortness of breath, found to have multifocal pneumonia and admitted to the hospitalist service for further evaluation and treatment. Further evaluation revealed a significantly positive troponins, underwent nuclear stress test on 4/26, and was positive for ischemia, cardiology planning on LHC today. Hospital course was also complicated by development of acute hypoxemic respiratory failure thought to be due to acute diastolic heart failure. See below for further details                                                                 Hospital Course    1. Acute hypoxic respiratory failure due to combination of acute on chronic systolic and diastolic CHF EF 89-21% and community-acquired pneumonia, this was present on admission.  Hypoxic respiratory failure has completely resolved  and she is symptom-free on room air, has been adequately diuresed and has completed pneumonia treatment. 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 Seen and cleared by cardiology for discharge. Will be placed on beta blocker, aspirin, Plavix, statin for second opinion prevention with outpatient cardiology follow-up. Also on low-dose Lasix and ARB.   2. CAD with drug-eluting stent placement this admission and the diagonal branch, treatment as in #1 above. We'll follow with PCP and cardiology postdischarge.  3. Hypertension. On beta blocker ARB and diuretic regimen PCP to monitor.  4. Chronic anemia due to combination of iron deficiency and B-12 deficiency. Hemoccult negative, placed on iron and B-12, request PCP to monitor iron levels and do outpatient age-appropriate and deficiency anemia and B-12 anemia workup.  5. History of paroxysmal atrial fibrillation. Continue on beta blocker not on anticoagulation per cardiology due to fall risk and anemia defer to primary cardiologist. [place] vasc 2 score of at least 6.  6. History  of sick sinus syndrome. Status post pacemaker placement follows with Dr. Lovena Gonzalez.    Discharge diagnosis     Principal Problem:   Multifocal pneumonia Active Problems:   Recurrent falls   Syncope   SDH (subdural hematoma) (HCC)   Anemia   Depression   Hypertension   Diabetes (HCC)   Pacemaker   Elevated troponin   Leukocytosis   SOB (shortness of breath)   Abnormal stress test   Chest pain   NSTEMI (non-ST elevated myocardial infarction) [facility])   Status post coronary artery stent placement    Discharge instructions    Discharge Instructions    Amb Referral to Cardiac Rehabilitation    Complete by:  As directed    Diagnosis:   NSTEMI PTCA Coronary Stents     Discharge instructions    Complete by:  As directed    Follow with Primary MD Meredith Ship, MD  in 7 days   Get CBC, CMP, Iron Panel, B12, 2 view Chest X ray checked  by Primary MD or SNF MD in 5-7 days ( we routinely change or add medications that can affect your baseline labs and fluid status, therefore we recommend that you get the mentioned basic workup next visit with your PCP, your PCP may decide not to get them or add new tests based on their clinical decision)  Activity: As tolerated with Full fall precautions use walker/cane & assistance as needed  Disposition Home    Diet:   Diet heart healthy/carb modified   Check your Weight same time everyday, if you gain over 2 pounds, or you develop in leg swelling, experience more shortness of breath or chest pain, call your Primary MD immediately. Follow Cardiac Low Salt Diet and 1.5 lit/day fluid restriction.  On your next visit with your primary care physician please Get Medicines reviewed and adjusted.  Please request your Prim.MD to go over all Hospital Tests and Procedure/Radiological results at the follow up, please get all Hospital records sent to your Prim MD by signing hospital release before you go home.  If you experience worsening of your admission symptoms, develop shortness of breath, life threatening emergency, suicidal or homicidal thoughts you must seek medical attention immediately by calling 911 or calling your MD immediately  if symptoms less severe.  You Must read complete instructions/literature along with all the possible adverse reactions/side effects for all the Medicines you take and that have been prescribed to you. Take any new Medicines after you have completely understood and accpet all the possible adverse reactions/side effects.   Do not drive, operate heavy machinery, perform activities at heights, swimming or participation in water activities or provide baby sitting services if your were admitted for syncope or siezures until you have seen by Primary MD or a Neurologist and advised to do so again.  Do not  drive when taking Pain medications.    Do not take more than prescribed Pain, Sleep and Anxiety Medications  Special Instructions: If you have smoked or chewed Tobacco  in the last 2 yrs please stop smoking, stop any regular Alcohol  and or any Recreational drug use.  Wear Seat belts while driving.   Please note  You were cared for by a hospitalist during your hospital stay. If you have any questions about your discharge medications or the care you received while you were in the hospital after you are discharged, you can call the unit and asked to speak with the hospitalist on call if the hospitalist  that took care of you is not available. Once you are discharged, your primary care physician will handle any further medical issues. Please note that NO REFILLS for any discharge medications will be authorized once you are discharged, as it is imperative that you return to your primary care physician (or establish a relationship with a primary care physician if you do not have one) for your aftercare needs so that they can reassess your need for medications and monitor your lab values.   Increase activity slowly    Complete by:  As directed       Discharge Medications   Allergies as of 07/20/2016      Reactions   Valium [diazepam] Anxiety   Makes patient hyper Makes patient hyper   Darifenacin Itching   Can take with Benadryl   Darvon [propoxyphene Hcl] Itching   Can take with Benadryl   Daypro [oxaprozin] Itching   Can take with Benadryl Can take with Benadryl   Enablex [darifenacin Hydrobromide Er] Itching   Can take with Benadryl   Propoxyphene Itching   Can take with Benadryl   Risperdal [risperidone] Itching   Can take with Benadryl Can take with Benadryl   Talwin [pentazocine] Itching   Can take with Benadryl Can take with Benadryl   Vicodin [hydrocodone-acetaminophen] Itching   Can take with Benadryl Can take with Benadryl   Codeine Hives, Itching   Can take with  Benadryl Can take with Benadryl   Oxycodone Itching   Can take with Benadryl Can take with Benadryl Can take with Benadryl Can take with Benadryl      Medication List    STOP taking these medications   ibuprofen 200 MG tablet Commonly known as:  ADVIL,MOTRIN     TAKE these medications   acetaminophen 500 MG tablet Commonly known as:  TYLENOL Take 500 mg by mouth daily.   amitriptyline 25 MG tablet Commonly known as:  ELAVIL Take 25 mg by mouth at bedtime.   ammonium lactate 12 % lotion Commonly known as:  LAC-HYDRIN Apply 1 application topically 2 (two) times daily.   aspirin EC 81 MG tablet Take 1 tablet (81 mg total) by mouth daily.   atorvastatin 20 MG tablet Commonly known as:  LIPITOR Take 1 tablet (20 mg total) by mouth daily at 6 PM. Can switch to any equal strength Statin   BENADRYL ALLERGY 25 MG tablet Generic drug:  diphenhydrAMINE Take 25 mg by mouth every 8 (eight) hours as needed.   Calcium Citrate-Vitamin D3 315-250 MG-UNIT Tabs Take 1 tablet by mouth 2 (two) times daily.   clopidogrel 75 MG tablet Commonly known as:  PLAVIX Take 1 tablet (75 mg total) by mouth daily with breakfast. Start taking on:  07/21/2016   ferrous sulfate 325 (65 FE) MG tablet Take 1 tablet (325 mg total) by mouth 2 (two) times daily with a meal.   flintstones complete 60 MG chewable tablet Chew 1 tablet by mouth daily.   FLUoxetine 20 MG capsule Commonly known as:  PROZAC Take 60 mg by mouth daily.   furosemide 20 MG tablet Commonly known as:  LASIX Take 1 tablet (20 mg total) by mouth daily.   gabapentin 300 MG capsule Commonly known as:  NEURONTIN Take 300-600 mg by mouth 2 (two) times daily. 300 mg in the mornning; 600 mg at night   losartan 25 MG tablet Commonly known as:  COZAAR Take 1 tablet (25 mg total) by mouth daily.   Melatonin 10 MG  Tabs Take 40 mg by mouth at bedtime.   MULTIVITAMIN ADULT Tabs Take 1 tablet by mouth daily.   pantoprazole 40  MG tablet Commonly known as:  PROTONIX Take 1 tablet (40 mg total) by mouth daily. Can switch to any PPI   pindolol 5 MG tablet Commonly known as:  VISKEN Take 1 tablet (5 mg total) by mouth 2 (two) times daily.   pramipexole 0.25 MG tablet Commonly known as:  MIRAPEX Take 0.25 mg by mouth every morning.   vitamin B-12 1000 MCG tablet Commonly known as:  CYANOCOBALAMIN Take 1 tablet (1,000 mcg total) by mouth daily.       Follow-up Information    Richardson Dopp, PA-C. Go on 07/30/2016.   Specialties:  Cardiology, Physician Assistant Why:  @ 9:15 am, please arrive at least 10 minutes early Contact information: [address] [place] [address] [phone]        Meredith Ship, MD. Schedule an appointment as soon as possible for a visit in 1 week(s).   Specialty:  Internal Medicine Contact information: [address] [place] [address] [phone]           Major procedures and Radiology Reports - PLEASE review detailed and final reports thoroughly  -      Limited Echo: 07/17/2016   Study Conclusions    -  Left ventricle: The cavity size was normal. Wall thickness wasnormal. The estimated ejection fraction was 50%. Hypokinesis ofthe apical anterior wall, apical septal wall, and true apex. Mitral valve: Mildly calcified annulus. Mildly calcified leaflets. There was trivial regurgitation. - Left atrium: The atrium was mildly dilated. - Right ventricle: The cavity size was normal. Pacer wire orcatheter noted in right ventricle. Systolic function was normal. Impressions:  Limited echo for wall motion. Definity contrast given.    07/19/16 Coronary Stent Intervention  Left Heart Cath and Coronary Angiography      Prox RCA to Mid RCA lesion, 20 %stenosed.  There is moderate left ventricular systolic dysfunction.  LV end diastolic pressure is mildly elevated.  The left ventricular ejection fraction is 35-45% by visual  estimate.  1st Diag lesion, 95 %stenosed.  A STENT PROMUS PREM MR 2.5X16 drug eluting stent was successfully placed.  Post intervention, there is a 0% residual stenosis.  1. Single vessel occlusive CAD. 95% large diagonal branch 2. Moderate LV dysfunction. EF 35-40%. 3. Mildly elevated LVEDP 4. Successful stenting of the first diagonal with DES  Plan: DAPT for one year.      Dg Chest 2 View  Result Date: 07/12/2016 CLINICAL DATA:  Respiratory difficulty. Shortness of breath. Previous smoker. History of pneumonia and bronchitis. EXAM: CHEST  2 VIEW COMPARISON:  05/01/2015 FINDINGS: Cardiac pacemaker. Shallow inspiration. Perihilar airspace and interstitial changes bilaterally. This is new since previous study. Peribronchial thickening. Changes likely to represent multifocal pneumonia. Normal heart size. No blunting of costophrenic angles. No pneumothorax. Calcified and tortuous aorta. Old fracture deformity of the right proximal humerus. Inferior vena caval filter. IMPRESSION: Bronchial wall thickening with perihilar interstitial and airspace disease new since previous study. Changes likely to represent multifocal pneumonia. Electronically Signed   By: Lucienne Capers M.D.   On: 07/12/2016 22:55   Nm Myocar Multi W/spect W/wall Motion / Ef  Result Date: 07/18/2016 CLINICAL DATA:  Chest pain EXAM: MYOCARDIAL IMAGING WITH SPECT (REST AND PHARMACOLOGIC-STRESS) GATED LEFT VENTRICULAR WALL MOTION STUDY LEFT VENTRICULAR EJECTION FRACTION TECHNIQUE: Standard myocardial SPECT imaging was performed after resting intravenous  injection of 10 mCi Tc-52m tetrofosmin. Subsequently, intravenous infusion of Lexiscan was performed under the supervision of the Cardiology staff. At peak effect of the drug, 30 mCi Tc-76m tetrofosmin was injected intravenously and standard myocardial SPECT imaging was performed. Quantitative gated imaging was also performed to evaluate left ventricular wall motion, and estimate  left ventricular ejection fraction. COMPARISON:  None. FINDINGS: Perfusion: There is a fixed defects noted in the anteroapical region. Reversibility is also noted in the adjacent anterior and posterior walls compatible with peri-infarct ischemia. Wall Motion: Generalized hypokinesia, most pronounced in the anteroapical region. Left Ventricular Ejection Fraction: 38 % End diastolic volume 518 ml End systolic volume 77 ml IMPRESSION: 1. Area of old infarction scar in the anteroapical segment with peri-infarct ischemia noted, most pronounced in the anterior wall but also noted in the posterior wall. 2. Diffuse generalized hypokinesia, most severe in the anteroapical wall. 3. Left ventricular ejection fraction 38% 4. Non invasive risk stratification*: High *2012 Appropriate Use Criteria for Coronary Revascularization Focused Update: J Am Coll Cardiol. 8416;60(6):301-601. http://content.airportbarriers.com.aspx?articleid=[email or web address] Electronically Signed   By: Rolm Baptise M.D.   On: 07/18/2016 16:12   Dg Chest Port 1v Same Day  Result Date: 07/16/2016 CLINICAL DATA:  Shortness of breath EXAM: PORTABLE CHEST 1 VIEW COMPARISON:  Chest x-ray of 07/12/2016 FINDINGS: There has been significant worsening of patchy airspace disease throughout the lungs right greater than left most consistent with multifocal pneumonia. No pleural effusion is noted. Cardiomegaly is stable and pacer wires remain. The bones are osteopenic. IMPRESSION: Significant worsening of patchy airspace disease bilaterally most consistent with multifocal pneumonia. Electronically Signed   By: Ivar Drape M.D.   On: 07/16/2016 10:57    Micro Results     Recent Results (from the past 240 hour(s))  Blood culture (routine x 2)     Status: None   Collection Time: 07/12/16 11:53 PM  Result Value Ref Range Status   Specimen Description BLOOD RIGHT ARM  Final   Special Requests   Final    BOTTLES DRAWN AEROBIC AND ANAEROBIC Blood Culture adequate  volume   Culture   Final    NO GROWTH 5 DAYS Performed at [facility], [address] [place], [place] [address]    Report Status 07/18/2016 FINAL  Final  Blood culture (routine x 2)     Status: None   Collection Time: 07/13/16 12:02 AM  Result Value Ref Range Status   Specimen Description BLOOD RIGHT ARM  Final   Special Requests   Final    BOTTLES DRAWN AEROBIC AND ANAEROBIC Blood Culture adequate volume   Culture   Final    NO GROWTH 5 DAYS Performed at [facility], [place] [address] [place], [place] [address]    Report Status 07/18/2016 FINAL  Final  Respiratory Panel by PCR     Status: None   Collection Time: 07/16/16  4:56 PM  Result Value Ref Range Status   Adenovirus NOT DETECTED NOT DETECTED Final   Coronavirus 229E NOT DETECTED NOT DETECTED Final   Coronavirus HKU1 NOT DETECTED NOT DETECTED Final   Coronavirus NL63 NOT DETECTED NOT DETECTED Final   Coronavirus OC43 NOT DETECTED NOT DETECTED Final   Metapneumovirus NOT DETECTED NOT DETECTED Final   Rhinovirus / Enterovirus NOT DETECTED NOT DETECTED Final   Influenza A NOT DETECTED NOT DETECTED Final   Influenza B NOT DETECTED NOT DETECTED Final   Parainfluenza Virus 1 NOT DETECTED NOT DETECTED Final   Parainfluenza Virus  2 NOT DETECTED NOT DETECTED Final   Parainfluenza Virus 3 NOT DETECTED NOT DETECTED Final   Parainfluenza Virus 4 NOT DETECTED NOT DETECTED Final   Respiratory Syncytial Virus NOT DETECTED NOT DETECTED Final   Bordetella pertussis NOT DETECTED NOT DETECTED Final   Chlamydophila pneumoniae NOT DETECTED NOT DETECTED Final   Mycoplasma pneumoniae NOT DETECTED NOT DETECTED Final    Today   Subjective    Meredith Gonzalez today has no headache,no chest abdominal pain,no new weakness tingling or numbness, feels much better wants to go home today.     Objective   Blood pressure 138/79, pulse 81, temperature 98.1 F (36.7 C), temperature source Oral, resp. rate 19, height 5\' 6"  (1.676 m),  weight 57.2 kg (126 lb 3.2 oz), SpO2 92 %.   Intake/Output Summary (Last 24 hours) at 07/20/16 1016 Last data filed at 07/20/16 0954  Gross per 24 hour  Intake           762.07 ml  Output             1650 ml  Net          -887.93 ml    Exam Awake Alert, Oriented x 3, No new F.N deficits, Normal affect [place].AT,PERRAL Supple Neck,No JVD, No cervical lymphadenopathy appriciated.  Symmetrical Chest wall movement, Good air movement bilaterally, few rales RRR,No Gallops,Rubs or new Murmurs, No Parasternal Heave +ve B.Sounds, Abd Soft, Non tender, No organomegaly appriciated, No rebound -guarding or rigidity. No Cyanosis, Clubbing or edema, No new Rash or bruise   Data Review   CBC w Diff:  Lab Results  Component Value Date   WBC 8.0 07/20/2016   HGB 9.3 (L) 07/20/2016   HCT 29.8 (L) 07/20/2016   PLT 319 07/20/2016   LYMPHOPCT 28.5 12/31/2012   MONOPCT 9.6 12/31/2012   EOSPCT 0.8 12/31/2012   BASOPCT 0.2 12/31/2012    CMP:  Lab Results  Component Value Date   NA 139 07/20/2016   NA 139 05/01/2015   K 3.9 07/20/2016   CL 105 07/20/2016   CO2 27 07/20/2016   BUN 12 07/20/2016   BUN 17 05/01/2015   CREATININE 0.60 07/20/2016   GLU 141 05/01/2015   PROT 6.6 07/18/2016   ALBUMIN 2.6 (L) 07/18/2016   BILITOT 0.4 07/18/2016   ALKPHOS 84 07/18/2016   AST 18 07/18/2016   ALT 15 07/18/2016  .   Total Time in preparing paper work, data evaluation and todays exam - 30 minutes  Lala Lund M.D on 07/20/2016 at 10:16 AM  [facility]   Office  [phone]

## 2016-07-20 NOTE — Discharge Instructions (Signed)
Follow with Primary MD Sinclair Ship, MD in 7 days   Get CBC, CMP, Iron Panel, B12, 2 view Chest X ray checked  by Primary MD or SNF MD in 5-7 days ( we routinely change or add medications that can affect your baseline labs and fluid status, therefore we recommend that you get the mentioned basic workup next visit with your PCP, your PCP may decide not to get them or add new tests based on their clinical decision)  Activity: As tolerated with Full fall precautions use walker/cane & assistance as needed  Disposition Home    Diet:   Diet heart healthy/carb modified   Check your Weight same time everyday, if you gain over 2 pounds, or you develop in leg swelling, experience more shortness of breath or chest pain, call your Primary MD immediately. Follow Cardiac Low Salt Diet and 1.5 lit/day fluid restriction.  On your next visit with your primary care physician please Get Medicines reviewed and adjusted.  Please request your Prim.MD to go over all Hospital Tests and Procedure/Radiological results at the follow up, please get all Hospital records sent to your Prim MD by signing hospital release before you go home.  If you experience worsening of your admission symptoms, develop shortness of breath, life threatening emergency, suicidal or homicidal thoughts you must seek medical attention immediately by calling 911 or calling your MD immediately  if symptoms less severe.  You Must read complete instructions/literature along with all the possible adverse reactions/side effects for all the Medicines you take and that have been prescribed to you. Take any new Medicines after you have completely understood and accpet all the possible adverse reactions/side effects.   Do not drive, operate heavy machinery, perform activities at heights, swimming or participation in water activities or provide baby sitting services if your were admitted for syncope or siezures until you have seen by Primary MD or a Neurologist  and advised to do so again.  Do not drive when taking Pain medications.    Do not take more than prescribed Pain, Sleep and Anxiety Medications  Special Instructions: If you have smoked or chewed Tobacco  in the last 2 yrs please stop smoking, stop any regular Alcohol  and or any Recreational drug use.  Wear Seat belts while driving.   Please note  You were cared for by a hospitalist during your hospital stay. If you have any questions about your discharge medications or the care you received while you were in the hospital after you are discharged, you can call the unit and asked to speak with the hospitalist on call if the hospitalist that took care of you is not available. Once you are discharged, your primary care physician will handle any further medical issues. Please note that NO REFILLS for any discharge medications will be authorized once you are discharged, as it is imperative that you return to your primary care physician (or establish a relationship with a primary care physician if you do not have one) for your aftercare needs so that they can reassess your need for medications and monitor your lab values.

## 2016-07-22 ENCOUNTER — Telehealth: Payer: Self-pay | Admitting: *Deleted

## 2016-07-22 ENCOUNTER — Encounter (HOSPITAL_COMMUNITY): Payer: Self-pay | Admitting: Cardiology

## 2016-07-22 NOTE — Telephone Encounter (Signed)
-----   Message from Eileen Stanford, PA-C sent at 07/20/2016  9:09 AM EDT ----- Needs TOC follow up for appt with Richardson Dopp PA-C

## 2016-07-22 NOTE — Telephone Encounter (Signed)
Patient contacted regarding discharge from [facility] on April 28,2018  Patient understands to follow up with provider. Melbourne Abts, PA on 07/30/16 at 9:15 at [facility] office.   Patient understands discharge instructions? Yes  Patient understands medications and regiment?   Yes  Patient understands to bring all medications to this visit? yes

## 2016-07-30 ENCOUNTER — Ambulatory Visit: Payer: Medicare Other | Admitting: Physician Assistant

## 2016-08-06 ENCOUNTER — Encounter: Payer: Self-pay | Admitting: Physician Assistant

## 2016-08-07 ENCOUNTER — Encounter: Payer: Self-pay | Admitting: Physician Assistant

## 2016-08-07 ENCOUNTER — Ambulatory Visit (INDEPENDENT_AMBULATORY_CARE_PROVIDER_SITE_OTHER): Payer: Medicare Other | Admitting: Physician Assistant

## 2016-08-07 VITALS — BP 120/52 | HR 68 | Ht 66.0 in | Wt 117.4 lb

## 2016-08-07 DIAGNOSIS — I10 Essential (primary) hypertension: Secondary | ICD-10-CM | POA: Diagnosis not present

## 2016-08-07 DIAGNOSIS — I214 Non-ST elevation (NSTEMI) myocardial infarction: Secondary | ICD-10-CM | POA: Diagnosis not present

## 2016-08-07 DIAGNOSIS — E785 Hyperlipidemia, unspecified: Secondary | ICD-10-CM

## 2016-08-07 DIAGNOSIS — Z950 Presence of cardiac pacemaker: Secondary | ICD-10-CM | POA: Diagnosis not present

## 2016-08-07 DIAGNOSIS — I255 Ischemic cardiomyopathy: Secondary | ICD-10-CM | POA: Diagnosis not present

## 2016-08-07 DIAGNOSIS — I2510 Atherosclerotic heart disease of native coronary artery without angina pectoris: Secondary | ICD-10-CM | POA: Diagnosis not present

## 2016-08-07 DIAGNOSIS — I480 Paroxysmal atrial fibrillation: Secondary | ICD-10-CM | POA: Diagnosis not present

## 2016-08-07 DIAGNOSIS — I5030 Unspecified diastolic (congestive) heart failure: Secondary | ICD-10-CM | POA: Insufficient documentation

## 2016-08-07 DIAGNOSIS — I5042 Chronic combined systolic (congestive) and diastolic (congestive) heart failure: Secondary | ICD-10-CM | POA: Diagnosis not present

## 2016-08-07 MED ORDER — ATORVASTATIN CALCIUM 40 MG PO TABS: 40.0000 mg | ORAL_TABLET | Freq: Every day | ORAL | 3 refills | Status: DC

## 2016-08-07 NOTE — Patient Instructions (Signed)
Medication Instructions:  1. INCREASE LIPITOR TO 40 MG DAILY; NEW RX HAS BEEN SENT IN  Labwork: 1. TODAY BMET  2. 09/19/16 FASTING LIPID AND LIVER PANEL  Testing/Procedures: 1. Your physician has requested that you have an echocardiogram. THIS IS TO BE DONE AFTER 10/18/16. Echocardiography is a painless test that uses sound waves to create images of your heart. It provides your doctor with information about the size and shape of your heart and how well your heart's chambers and valves are working. This procedure takes approximately one hour. There are no restrictions for this procedure.    Follow-Up: DR. Harrington Challenger 3 MONTHS ; MAKE SURE ECHO HAS BEEN COMPLETED BEFORE APPT WITH DR. ROSS  Any Other Special Instructions Will Be Listed Below (If Applicable).     If you need a refill on your cardiac medications before your next appointment, please call your pharmacy.

## 2016-08-07 NOTE — Progress Notes (Signed)
Cardiology Office Note:    Date:  08/07/2016   ID:  Meredith Gonzalez, DOB 27-Feb-1936, MRN [id number]  PCP:  Sinclair Ship, MD  Cardiologist:  Dr. Dorris Carnes   Electrophysiologist: Dr. Cristopher Peru    Referring MD: Sinclair Ship, MD   Chief Complaint  Patient presents with  . Hospitalization Follow-up    s/p NSTEMI >> PCI; CHF    History of Present Illness:    Meredith Gonzalez is a 81 y.o. female with a hx of AFib, s/p fall resulting in subdural hematoma, HTN, hypothyroidism, s/p pacemaker (RV lead revision done in 2014), no CAD by LHC in 1995, syncope felt to be related to autonomic dysfunction.  She is not a candidate for anticoagulation due to prior fall with subdural hematoma.  Prior tilt table test was positive and she was changed from Diltiazem to Pindolol.  Last seen by Dr. Dorris Carnes in 5/17.    She was admitted 4/20-4/28 for multifocal pneumonia.  She was noted to have elevated Troponin levels and she was evaluated by Cardiology.  She had apical anterior, apical septal, apical lateral wall motion abnormalities on echocardiogram. He clears stress test demonstrated anteroapical infarct with peri-infarct ischemia with EF 38%. She required IV Lasix for volume excess. Cardiac catheterization demonstrated high-grade disease in first diagonal which was treated with a DES. EF was 35-45% on left ventriculogram.  She returns for follow-up.  She is here with her son.  Since DC, she has been doing well.  She denies chest pain, shortness of breath, syncope, orthopnea, PND or significant pedal edema. She starts cardiac rehabilitation at [facility] soon.  She did see her PCP for follow up and had a chest X-ray.    Prior CV studies:   The following studies were reviewed today:  LHC 07/19/16 LM normal LAD okay, D1 95 LCx normal RCA proximal 20 EF 35-45 LVEDP 20 PCI: 2.5 x 16 mm Promus DES to the D1  Limited echo 07/17/16 EF 50, apical anterior, apical septal and true apical HK, MAC, trivial MR, mild  LAE  Echo 07/14/16 Apical, apical inferolateral, apical anterior, apical lateral HK, normal LV function, grade 2 diastolic dysfunction, trivial MR, mild LAE, PASP 50  Myoview 07/16/16 Anteroapical infarct with peri-infarct ischemia, EF 38, high risk   Past Medical History:  Diagnosis Date  . Anemia   . Arthritis   . Diabetes mellitus without complication ([place])   . Fibromyalgia   . Fracture of right humerus   . Generalized weakness   . Hypothyroidism   . Incontinence of urine   . Major depression, chronic   . Neuropathy   . Pacemaker   . Recurrent falls   . Stroke ([place]) 2000  . Syncope and collapse     Past Surgical History:  Procedure Laterality Date  . ABDOMINAL HYSTERECTOMY    . basal cell cancer removal    . CARPAL TUNNEL RELEASE     bilateral  . CHOLECYSTECTOMY    . CHOLECYSTECTOMY    . COLONOSCOPY    . CORONARY STENT INTERVENTION N/A 07/19/2016   Procedure: Coronary Stent Intervention;  Surgeon: Peter M Martinique, MD;  Location: [facility];  Service: Cardiovascular;  Laterality: N/A;  . EYE SURGERY     right eye catarace/lens implant  . FEMUR FRACTURE SURGERY     bilateral  . GASTRIC BYPASS    . LEAD REVISION N/A 01/01/2013   Procedure: LEAD REVISION;  Surgeon: Evans Lance, MD;  Location: [facility] CATH LAB;  Service: Cardiovascular;  Laterality: N/A;  . LEFT HEART CATH AND CORONARY ANGIOGRAPHY N/A 07/19/2016   Procedure: Left Heart Cath and Coronary Angiography;  Surgeon: Peter M Martinique, MD;  Location: [facility] LAB;  Service: Cardiovascular;  Laterality: N/A;  . PACEMAKER INSERTION    . right ankle surgery      Current Medications: Current Meds  Medication Sig  . acetaminophen (TYLENOL) 500 MG tablet Take 500 mg by mouth as needed for moderate pain.   Marland Kitchen amitriptyline (ELAVIL) 25 MG tablet Take 25 mg by mouth at bedtime.  Marland Kitchen ammonium lactate (LAC-HYDRIN) 12 % lotion Apply 1 application topically 2 (two) times daily.  Marland Kitchen aspirin EC 81 MG tablet Take 1  tablet (81 mg total) by mouth daily.  . Calcium Citrate-Vitamin D3 315-250 MG-UNIT TABS Take 1 tablet by mouth 2 (two) times daily.  . clopidogrel (PLAVIX) 75 MG tablet Take 1 tablet (75 mg total) by mouth daily with breakfast.  . diphenhydrAMINE (BENADRYL ALLERGY) 25 MG tablet Take 25 mg by mouth every 8 (eight) hours as needed.  . ferrous sulfate 325 (65 FE) MG tablet Take 1 tablet (325 mg total) by mouth 2 (two) times daily with a meal.  . flintstones complete (FLINTSTONES) 60 MG chewable tablet Chew 1 tablet by mouth daily.  Marland Kitchen FLUoxetine (PROZAC) 20 MG capsule Take 60 mg by mouth daily.   . furosemide (LASIX) 20 MG tablet Take 1 tablet (20 mg total) by mouth daily.  Marland Kitchen gabapentin (NEURONTIN) 300 MG capsule Take 300-600 mg by mouth 2 (two) times daily. 300 mg in the mornning; 600 mg at night  . losartan (COZAAR) 25 MG tablet Take 1 tablet (25 mg total) by mouth daily.  . Melatonin 10 MG TABS Take 40 mg by mouth at bedtime.  . Multiple Vitamins-Minerals (MULTIVITAMIN ADULT) TABS Take 1 tablet by mouth daily.  . pantoprazole (PROTONIX) 40 MG tablet Take 1 tablet (40 mg total) by mouth daily. Can switch to any PPI  . pindolol (VISKEN) 5 MG tablet Take 1 tablet (5 mg total) by mouth 2 (two) times daily.  . pramipexole (MIRAPEX) 0.25 MG tablet Take 0.25 mg by mouth every morning.  . vitamin B-12 (CYANOCOBALAMIN) 1000 MCG tablet Take 1 tablet (1,000 mcg total) by mouth daily.  . [DISCONTINUED] atorvastatin (LIPITOR) 20 MG tablet Take 1 tablet (20 mg total) by mouth daily at 6 PM. Can switch to any equal strength Statin     Allergies:   Valium [diazepam]; Darifenacin; Darvon [propoxyphene hcl]; Daypro [oxaprozin]; Enablex [darifenacin hydrobromide er]; Propoxyphene; Risperdal [risperidone]; Talwin [pentazocine]; Vicodin [hydrocodone-acetaminophen]; Codeine; and Oxycodone   Social History   Social History  . Marital status: Widowed    Spouse name: N/A  . Number of children: N/A  . Years of  education: N/A   Social History Main Topics  . Smoking status: Former Smoker    Packs/day: 0.50    Years: 4.00    Types: Cigarettes    Quit date: 08/07/1972  . Smokeless tobacco: Never Used  . Alcohol use Yes     Comment: wine cooler once or twice per year  . Drug use: No  . Sexual activity: No   Other Topics Concern  . None   Social History Narrative  . None     Family Hx: The patient's family history includes Alcoholism in her brother, brother, father, and sister; Congestive Heart Failure in her mother; Heart attack in her father; Throat cancer in her brother.  ROS:  Please see the history of present illness.    ROS All other systems reviewed and are negative.   EKGs/Labs/Other Test Reviewed:    EKG:  EKG is  ordered today.  The ekg ordered today demonstrates A paced, HR 68, LAD, IVCD, inc RBBB, TWI 1, aVL, QTc 457 ms  Recent Labs: 07/18/2016: ALT 15; B Natriuretic Peptide 841.4 07/20/2016: BUN 12; Creatinine, Ser 0.60; Hemoglobin 9.3; Platelets 319; Potassium 3.9; Sodium 139   Recent Lipid Panel    Component Value Date/Time   CHOL 118 07/18/2016 0349   TRIG 44 07/18/2016 0349   HDL 44 07/18/2016 0349   CHOLHDL 2.7 07/18/2016 0349   VLDL 9 07/18/2016 0349   LDLCALC 65 07/18/2016 0349     Physical Exam:    VS:  BP (!) 120/52   Pulse 68   Ht _0  (1.676 m)   Wt 117 lb 6.4 oz (53.3 kg)   BMI 18.95 kg/m     Wt Readings from Last 3 Encounters:  08/07/16 117 lb 6.4 oz (53.3 kg)  07/20/16 126 lb 3.2 oz (57.2 kg)  09/14/15 123 lb (55.8 kg)     Physical Exam  Constitutional: She is oriented to person, place, and time. She appears well-developed and well-nourished. No distress.  HENT:  Head: Normocephalic and atraumatic.  Eyes: No scleral icterus.  Neck: Normal range of motion. No JVD present.  Cardiovascular: Normal rate, regular rhythm, S1 normal, S2 normal and normal heart sounds.   No murmur heard. Pulmonary/Chest: Breath sounds normal. She has no  wheezes. She has no rhonchi. She has no rales.  Abdominal: Soft. There is no tenderness.  Musculoskeletal: She exhibits no edema or deformity (R wrist without hematoma).  Neurological: She is alert and oriented to person, place, and time.  Skin: Skin is warm and dry.  Psychiatric: She has a normal mood and affect.    ASSESSMENT:    1. NSTEMI (non-ST elevated myocardial infarction) ([place])   2. Coronary artery disease involving native coronary artery of native heart without angina pectoris   3. Chronic combined systolic and diastolic CHF (congestive heart failure) ([place])   4. Ischemic cardiomyopathy   5. PAF (paroxysmal atrial fibrillation) ([place])   6. Essential hypertension   7. Hyperlipidemia, unspecified hyperlipidemia type   8. Pacemaker    PLAN:    In order of problems listed above:  1. NSTEMI (non-ST elevated myocardial infarction) [facility]) - Presentation was likely proceeded by myocardial infarction 1 week prior to coming to the hospital. She was treated for multifocal pneumonia but likely had pulmonary edema as well.  She is now s/p DES to the D1.  We discussed the importance of dual antiplatelet Rx.  She will start cardiac rehab soon at [facility].  Continue ASA, Plavix, statin, beta-blocker.   2. Coronary artery disease involving native coronary artery of native heart without angina pectoris - No further angina.  Continue ASA, Plavix, statin, beta-blocker.  Will increase Lipitor to 40 mg QD given recent ACS.  3. Chronic combined systolic and diastolic CHF (congestive heart failure) ([facility]) - Primarily diastolic.  However, EF on LV gram was 35-45.  Will repeat Echo in 3 mos.  Continue angiotensin receptor blocker, beta-blocker.  Check BMET today.  If BUN/SCr increased, consider decreasing Lasix.   4. Ischemic cardiomyopathy - Repeat Echo in 3 mos.  If EF < 35, refer back to Dr. Cristopher Peru for consideration of ICD.  5. PAF (paroxysmal atrial fibrillation) ([place]) - Not a candidate  for anticoagulation.  6. Essential hypertension - The patient's blood pressure is controlled on her current regimen.  Continue current therapy.    7. Hyperlipidemia, unspecified hyperlipidemia type - Increase Lipitor to 40 mg QD.  Check Lipids and LFTs in 6 weeks.    8. Pacemaker - FU with EP as planned.   Dispo:  Return in about 3 months (around 11/07/2016) for Routine Follow Up, w/ Dr. Harrington Challenger.   Medication Adjustments/Labs and Tests Ordered: Current medicines are reviewed at length with the patient today.  Concerns regarding medicines are outlined above.  Orders/Tests:  Orders Placed This Encounter  Procedures  . Basic Metabolic Panel (BMET)  . Lipid Profile  . Hepatic function panel  . EKG 12-Lead  . ECHOCARDIOGRAM COMPLETE   Medication changes: Meds ordered this encounter  Medications  . atorvastatin (LIPITOR) 40 MG tablet    Sig: Take 1 tablet (40 mg total) by mouth daily.    Dispense:  90 tablet    Refill:  3   Signed, Richardson Dopp, PA-C  08/07/2016 [facility] [place], [place],   [address] Phone: [phone]; Fax: [phone]

## 2016-08-07 NOTE — Addendum Note (Signed)
Addended by: Briant Cedar on: 08/07/2016 11:54 AM   Modules accepted: Orders

## 2016-08-08 ENCOUNTER — Telehealth: Payer: Self-pay | Admitting: *Deleted

## 2016-08-08 DIAGNOSIS — R0602 Shortness of breath: Secondary | ICD-10-CM

## 2016-08-08 DIAGNOSIS — I10 Essential (primary) hypertension: Secondary | ICD-10-CM

## 2016-08-08 DIAGNOSIS — I480 Paroxysmal atrial fibrillation: Secondary | ICD-10-CM

## 2016-08-08 LAB — BASIC METABOLIC PANEL
BUN / CREAT RATIO: 33 — AB (ref 12–28)
BUN: 28 mg/dL — ABNORMAL HIGH (ref 8–27)
CHLORIDE: 102 mmol/L (ref 96–106)
CO2: 30 mmol/L — ABNORMAL HIGH (ref 18–29)
Calcium: 8.6 mg/dL — ABNORMAL LOW (ref 8.7–10.3)
Creatinine, Ser: 0.85 mg/dL (ref 0.57–1.00)
GFR, EST AFRICAN AMERICAN: 76 mL/min/{1.73_m2} (ref >59)
GFR, EST NON AFRICAN AMERICAN: 66 mL/min/{1.73_m2} (ref >59)
Glucose: 107 mg/dL — ABNORMAL HIGH (ref 65–99)
POTASSIUM: 3.4 mmol/L — AB (ref 3.5–5.2)
Sodium: 145 mmol/L — ABNORMAL HIGH (ref 134–144)

## 2016-08-08 MED ORDER — FUROSEMIDE 20 MG PO TABS: 20.0000 mg | ORAL_TABLET | ORAL | 1 refills | Status: DC

## 2016-08-08 MED ORDER — POTASSIUM CHLORIDE CRYS ER 20 MEQ PO TBCR: 20.0000 meq | EXTENDED_RELEASE_TABLET | ORAL | 1 refills | Status: DC

## 2016-08-08 NOTE — Telephone Encounter (Signed)
-----   Message from Liliane Shi, [place] sent at 08/08/2016  8:06 AM EDT ----- Please call patient. Elevated BUN, sodium indicates over-diuresis. The potassium is low. Decrease Lasix to 20 mg every Mon, Wed, Fri. Start K+ 20 mEq every Mon, Wed, Fri. BMET 1 week. Weigh daily - call if weight increases by 3 lbs in 1 day.  Richardson Dopp, PA-C    08/08/2016 8:04 AM

## 2016-08-08 NOTE — Telephone Encounter (Signed)
Patient informed and verbalized understanding of plan. 

## 2016-08-08 NOTE — Telephone Encounter (Signed)
Patient returned call

## 2016-08-15 ENCOUNTER — Encounter: Payer: Self-pay | Admitting: Internal Medicine

## 2016-08-15 ENCOUNTER — Telehealth: Payer: Self-pay | Admitting: Internal Medicine

## 2016-08-15 NOTE — Telephone Encounter (Signed)
lmtcb

## 2016-08-15 NOTE — Telephone Encounter (Signed)
[employer] home monitor is up to date and should transmit any alerts or abnormalities overnight.  Reviewed rhythm strips from cardiac rehab with Dr. Caryl Comes as Dr. Lovena Le is not back in the office until next week.  Per Dr. Caryl Comes, plan to review strips with [employer] representative to ensure no recommended DDD-CLS reprogramming as patient was in "warm-up" period of cardiac rehab per strips.  As patient was asymptomatic with episode, no immediate reprogramming recommendations.

## 2016-08-15 NOTE — Telephone Encounter (Signed)
Received incoming call from Hilda Blades, nurse at [facility]. Nurse stated pt's heart went into an irregular rhythm while at cardiac rehab - rhytum a-paced at HR 121 (while at rest). Heart rhythm came out of irrgular rhythm and returned to normal paced rhythm. Requested Hilda Blades to please fax strips to our office [phone]. Pt was asymptomatic, and completed her visit at cardiac rehab. Informed I will forward to [facility], to see if we can get a manual transmission. Debra verbalized understanding.

## 2016-08-15 NOTE — Telephone Encounter (Signed)
New Message:   Please call,pt had an irregular rhythm while in Rehab today.

## 2016-08-15 NOTE — Telephone Encounter (Signed)
New Message    Following up trying to reach Meredith Gonzalez or Meredith Gonzalez on a irregular rhythm

## 2016-08-16 ENCOUNTER — Telehealth: Payer: Self-pay | Admitting: *Deleted

## 2016-08-16 ENCOUNTER — Other Ambulatory Visit: Payer: Medicare Other | Admitting: *Deleted

## 2016-08-16 DIAGNOSIS — I480 Paroxysmal atrial fibrillation: Secondary | ICD-10-CM

## 2016-08-16 DIAGNOSIS — I10 Essential (primary) hypertension: Secondary | ICD-10-CM

## 2016-08-16 DIAGNOSIS — E785 Hyperlipidemia, unspecified: Secondary | ICD-10-CM

## 2016-08-16 DIAGNOSIS — I2510 Atherosclerotic heart disease of native coronary artery without angina pectoris: Secondary | ICD-10-CM

## 2016-08-16 DIAGNOSIS — R0602 Shortness of breath: Secondary | ICD-10-CM

## 2016-08-16 LAB — LIPID PANEL
CHOLESTEROL TOTAL: 139 mg/dL (ref 100–199)
Chol/HDL Ratio: 2.2 ratio (ref 0.0–4.4)
HDL: 63 mg/dL (ref >39)
LDL CALC: 63 mg/dL (ref 0–99)
TRIGLYCERIDES: 65 mg/dL (ref 0–149)
VLDL Cholesterol Cal: 13 mg/dL (ref 5–40)

## 2016-08-16 LAB — HEPATIC FUNCTION PANEL
ALT: 22 IU/L (ref 0–32)
AST: 30 IU/L (ref 0–40)
Albumin: 3.5 g/dL (ref 3.5–4.8)
Alkaline Phosphatase: 96 IU/L (ref 39–117)
BILIRUBIN, DIRECT: 0.09 mg/dL (ref 0.00–0.40)
Bilirubin Total: 0.3 mg/dL (ref 0.0–1.2)
Total Protein: 6.5 g/dL (ref 6.0–8.5)

## 2016-08-16 LAB — BASIC METABOLIC PANEL WITH GFR
BUN/Creatinine Ratio: 25 (ref 12–28)
BUN: 17 mg/dL (ref 8–27)
CO2: 23 mmol/L (ref 18–29)
Calcium: 8.8 mg/dL (ref 8.7–10.3)
Chloride: 106 mmol/L (ref 96–106)
Creatinine, Ser: 0.69 mg/dL (ref 0.57–1.00)
GFR calc Af Amer: 96 mL/min/{1.73_m2}
GFR calc non Af Amer: 83 mL/min/{1.73_m2}
Glucose: 114 mg/dL — ABNORMAL HIGH (ref 65–99)
Potassium: 4 mmol/L (ref 3.5–5.2)
Sodium: 142 mmol/L (ref 134–144)

## 2016-08-16 NOTE — Telephone Encounter (Signed)
-----   Message from Liliane Shi, [place] sent at 08/16/2016  4:58 PM EDT ----- Please call the patient Kidney function is normal. Continue with current treatment plan. Lipids and LFTs checked too early.  Credit patient and check Lipids and LFTs in 1 month >> ok to get on day she comes in for pacer check 09/13/16. Richardson Dopp, PA-C   08/16/2016 4:56 PM

## 2016-08-16 NOTE — Telephone Encounter (Signed)
Lmtcb to go over lab results and recommendations. Lipid and Liver panel were not be drawn at this time. Per Proctor he would like to repeat FLP/LFT in 1 month same day pt comes in to see Dr. Lovena Le for a pacer check. I will send a message to the billing dept per PA to credit pt for the FLP/LFT that were done today.

## 2016-08-16 NOTE — Progress Notes (Signed)
[id number]  

## 2016-08-16 NOTE — Telephone Encounter (Signed)
EKG reviewed with [employer] representative Gerald Stabs, he will contact tech services and follow up with device clinic with any further recommendations.

## 2016-08-20 ENCOUNTER — Telehealth: Payer: Self-pay | Admitting: Internal Medicine

## 2016-08-20 NOTE — Telephone Encounter (Signed)
New message    Hilda Blades, RN is calling about EKG strips she faxed over. She wants to touch base and see if they were received and if they have been looked at.

## 2016-08-20 NOTE — Telephone Encounter (Signed)
-----   Message from Liliane Shi, [place] sent at 08/16/2016  4:58 PM EDT ----- Please call the patient Kidney function is normal. Continue with current treatment plan. Lipids and LFTs checked too early.  Credit patient and check Lipids and LFTs in 1 month >> ok to get on day she comes in for pacer check 09/13/16. Richardson Dopp, PA-C   08/16/2016 4:56 PM

## 2016-08-20 NOTE — Telephone Encounter (Signed)
Ptcb and has been notified of lab results by phone with verbal understanding. I did also advise pt that we will credit her account for LIPID AND LIVER PANEL that was done on 08/16/16. Lipid and Liver Panel were not to be done at this time. Pt is now reschedule to have FASTING LIPID AND LIVER PANEL done 09/13/16 same day she sees Dr. Lovena Le. I will send a message to our billing dept. Pt thanked me for my call today.

## 2016-08-20 NOTE — Telephone Encounter (Signed)
Ptcb earlier today for her lab results. I tried to reach pt though VM Box full could not lmom. I tried to reach pt x 2 to go over lab results.

## 2016-08-20 NOTE — Telephone Encounter (Signed)
Meredith Gonzalez is returning your call about her test results . Please call . Thanks

## 2016-08-21 NOTE — Telephone Encounter (Signed)
Called, left message for Ernesto Rutherford, Therapist, sports. Informed I will call back once Dr. Lovena Le has had a chance to review the telemetry strips from visit last week. Informed I will try to call back this afternoon or tomorrow morning.

## 2016-08-22 ENCOUNTER — Encounter: Payer: Self-pay | Admitting: Internal Medicine

## 2016-08-22 NOTE — Telephone Encounter (Signed)
Called, unable to reach Bethany, Therapist, sports at [facility]. Left detailed message. Dr. Lovena Le stated  - "no change". Dr. Lovena Le did not list any rehab restrictions. Will f/u w/ pt at next appt on 09/13/16. Requested call back with questions or concerns.

## 2016-08-23 NOTE — Telephone Encounter (Signed)
Per industry and tech services, pts device functioning appropriately and no further f/u needed.

## 2016-08-29 ENCOUNTER — Encounter: Payer: Self-pay | Admitting: Internal Medicine

## 2016-09-13 ENCOUNTER — Other Ambulatory Visit: Payer: Medicare Other | Admitting: *Deleted

## 2016-09-13 ENCOUNTER — Telehealth: Payer: Self-pay | Admitting: *Deleted

## 2016-09-13 ENCOUNTER — Encounter: Payer: Self-pay | Admitting: Internal Medicine

## 2016-09-13 ENCOUNTER — Ambulatory Visit (INDEPENDENT_AMBULATORY_CARE_PROVIDER_SITE_OTHER): Payer: Medicare Other | Admitting: Internal Medicine

## 2016-09-13 VITALS — BP 124/62 | HR 83 | Ht 66.0 in | Wt 121.4 lb

## 2016-09-13 DIAGNOSIS — Z950 Presence of cardiac pacemaker: Secondary | ICD-10-CM | POA: Diagnosis not present

## 2016-09-13 DIAGNOSIS — I480 Paroxysmal atrial fibrillation: Secondary | ICD-10-CM | POA: Diagnosis not present

## 2016-09-13 DIAGNOSIS — I2510 Atherosclerotic heart disease of native coronary artery without angina pectoris: Secondary | ICD-10-CM

## 2016-09-13 DIAGNOSIS — I255 Ischemic cardiomyopathy: Secondary | ICD-10-CM

## 2016-09-13 DIAGNOSIS — I2589 Other forms of chronic ischemic heart disease: Secondary | ICD-10-CM

## 2016-09-13 LAB — HEPATIC FUNCTION PANEL
ALBUMIN: 3.7 g/dL (ref 3.5–4.8)
ALK PHOS: 98 IU/L (ref 39–117)
ALT: 22 IU/L (ref 0–32)
AST: 25 IU/L (ref 0–40)
BILIRUBIN TOTAL: 0.2 mg/dL (ref 0.0–1.2)
BILIRUBIN, DIRECT: 0.09 mg/dL (ref 0.00–0.40)
Total Protein: 6.9 g/dL (ref 6.0–8.5)

## 2016-09-13 LAB — LIPID PANEL
CHOLESTEROL TOTAL: 145 mg/dL (ref 100–199)
Chol/HDL Ratio: 2.5 ratio (ref 0.0–4.4)
HDL: 58 mg/dL (ref >39)
LDL Calculated: 74 mg/dL (ref 0–99)
TRIGLYCERIDES: 63 mg/dL (ref 0–149)
VLDL Cholesterol Cal: 13 mg/dL (ref 5–40)

## 2016-09-13 NOTE — Telephone Encounter (Signed)
VM box full could not lmom

## 2016-09-13 NOTE — Progress Notes (Signed)
HPI Mrs. Brower returns today for followup. She is a pleasant 81 yo woman with a h/o symptomatic bradycardia and syncope, s/p PPM insertion. She had developed RV lead dysfunction and has undergone lead revision remotely.  She denies chest pain. She denies fevers or chills.  Allergies  Allergen Reactions  . Valium [Diazepam] Anxiety    Makes patient hyper Makes patient hyper  . Darifenacin Itching    Can take with Benadryl  . Darvon [Propoxyphene Hcl] Itching    Can take with Benadryl  . Daypro [Oxaprozin] Itching    Can take with Benadryl Can take with Benadryl  . Enablex [Darifenacin Hydrobromide Er] Itching    Can take with Benadryl  . Propoxyphene Itching    Can take with Benadryl  . Risperdal [Risperidone] Itching    Can take with Benadryl Can take with Benadryl  . Talwin [Pentazocine] Itching    Can take with Benadryl Can take with Benadryl  . Vicodin [Hydrocodone-Acetaminophen] Itching    Can take with Benadryl Can take with Benadryl  . Codeine Hives and Itching    Can take with Benadryl Can take with Benadryl  . Oxycodone Itching    Can take with Benadryl Can take with Benadryl Can take with Benadryl Can take with Benadryl     Current Outpatient Prescriptions  Medication Sig Dispense Refill  . acetaminophen (TYLENOL) 500 MG tablet Take 500 mg by mouth as needed for moderate pain.     Marland Kitchen amitriptyline (ELAVIL) 25 MG tablet Take 25 mg by mouth at bedtime.    Marland Kitchen ammonium lactate (LAC-HYDRIN) 12 % lotion Apply 1 application topically 2 (two) times daily.  0  . aspirin EC 81 MG tablet Take 1 tablet (81 mg total) by mouth daily. 90 tablet 3  . atorvastatin (LIPITOR) 40 MG tablet Take 1 tablet (40 mg total) by mouth daily. 90 tablet 3  . Calcium Citrate-Vitamin D3 315-250 MG-UNIT TABS Take 1 tablet by mouth 2 (two) times daily.    . clopidogrel (PLAVIX) 75 MG tablet Take 1 tablet (75 mg total) by mouth daily with breakfast. 30 tablet 3  . diphenhydrAMINE (BENADRYL  ALLERGY) 25 MG tablet Take 25 mg by mouth every 8 (eight) hours as needed.    . ferrous sulfate 325 (65 FE) MG tablet Take 1 tablet (325 mg total) by mouth 2 (two) times daily with a meal. 60 tablet 0  . flintstones complete (FLINTSTONES) 60 MG chewable tablet Chew 1 tablet by mouth daily.    Marland Kitchen FLUoxetine (PROZAC) 20 MG capsule Take 60 mg by mouth daily.   0  . furosemide (LASIX) 20 MG tablet Take 1 tablet (20 mg total) by mouth 3 (three) times a week. Monday, Wednesday & Friday 30 tablet 1  . gabapentin (NEURONTIN) 300 MG capsule Take 300-600 mg by mouth 2 (two) times daily. 300 mg in the mornning; 600 mg at night    . losartan (COZAAR) 25 MG tablet Take 1 tablet (25 mg total) by mouth daily. 30 tablet 0  . Melatonin 10 MG TABS Take 40 mg by mouth at bedtime.    . Multiple Vitamins-Minerals (MULTIVITAMIN ADULT) TABS Take 1 tablet by mouth daily.    . pantoprazole (PROTONIX) 40 MG tablet Take 1 tablet (40 mg total) by mouth daily. Can switch to any PPI 30 tablet 3  . pindolol (VISKEN) 5 MG tablet Take 1 tablet (5 mg total) by mouth 2 (two) times daily. 60 tablet 12  . potassium  chloride SA (KLOR-CON M20) 20 MEQ tablet Take 1 tablet (20 mEq total) by mouth 3 (three) times a week. Monday, Wednesday & Friday 30 tablet 1  . pramipexole (MIRAPEX) 0.25 MG tablet Take 0.25 mg by mouth every morning.    . vitamin B-12 (CYANOCOBALAMIN) 1000 MCG tablet Take 1 tablet (1,000 mcg total) by mouth daily. 30 tablet 0   No current facility-administered medications for this visit.      Past Medical History:  Diagnosis Date  . Anemia   . Arthritis   . Diabetes mellitus without complication ([place])   . Fibromyalgia   . Fracture of right humerus   . Generalized weakness   . Hypothyroidism   . Incontinence of urine   . Major depression, chronic   . Neuropathy   . Pacemaker   . Recurrent falls   . Stroke ([place]) 2000  . Syncope and collapse     ROS:   All systems reviewed and negative except as noted in  the HPI.   Past Surgical History:  Procedure Laterality Date  . ABDOMINAL HYSTERECTOMY    . basal cell cancer removal    . CARPAL TUNNEL RELEASE     bilateral  . CHOLECYSTECTOMY    . CHOLECYSTECTOMY    . COLONOSCOPY    . CORONARY STENT INTERVENTION N/A 07/19/2016   Procedure: Coronary Stent Intervention;  Surgeon: Peter M Martinique, MD;  Location: [facility];  Service: Cardiovascular;  Laterality: N/A;  . EYE SURGERY     right eye catarace/lens implant  . FEMUR FRACTURE SURGERY     bilateral  . GASTRIC BYPASS    . LEAD REVISION N/A 01/01/2013   Procedure: LEAD REVISION;  Surgeon: Evans Lance, MD;  Location: [facility] CATH LAB;  Service: Cardiovascular;  Laterality: N/A;  . LEFT HEART CATH AND CORONARY ANGIOGRAPHY N/A 07/19/2016   Procedure: Left Heart Cath and Coronary Angiography;  Surgeon: Peter M Martinique, MD;  Location: [facility];  Service: Cardiovascular;  Laterality: N/A;  . PACEMAKER INSERTION    . right ankle surgery       Family History  Problem Relation Age of Onset  . Congestive Heart Failure Mother   . Heart attack Father   . Alcoholism Father   . Alcoholism Sister   . Alcoholism Brother   . Alcoholism Brother   . Throat cancer Brother      Social History   Social History  . Marital status: Widowed    Spouse name: N/A  . Number of children: N/A  . Years of education: N/A   Occupational History  . Not on file.   Social History Main Topics  . Smoking status: Former Smoker    Packs/day: 0.50    Years: 4.00    Types: Cigarettes    Quit date: 08/07/1972  . Smokeless tobacco: Never Used  . Alcohol use Yes     Comment: wine cooler once or twice per year  . Drug use: No  . Sexual activity: No   Other Topics Concern  . Not on file   Social History Narrative  . No narrative on file     BP 124/62   Pulse 83   Ht 5\' 6"  (1.676 m)   Wt 121 lb 6.4 oz (55.1 kg)   SpO2 97%   BMI 19.59 kg/m   Physical Exam:  Well appearing 81 year old  woman, NAD HEENT: Unremarkable Neck: 7 cm JVD, no thyromegally Back:  No CVA tenderness Lungs:  Clear  with no wheezes, rales, or rhonchi. HEART:  Regular rate rhythm, no murmurs, no rubs, no clicks Abd:  soft, positive bowel sounds, no organomegally, no rebound, no guarding Ext:  2 plus pulses, no edema, no cyanosis, no clubbing Skin:  No rashes no nodules Neuro:  CN II through XII intact, motor grossly intact   DEVICE  Normal device function.  See PaceArt for details.   Assess/Plan: 1. Syncope - She has had no additional episodes.  I have asked her to eat more salt. 2. HTN - her blood pressure is on the low side. 3. PPM - her [employer] DDD PM is working normally.  Mikle Bosworth.D.

## 2016-09-13 NOTE — Telephone Encounter (Signed)
-----   Message from Liliane Shi, [place] sent at 09/13/2016  5:22 PM EDT ----- Please call the patient Lipids close to goal. LFTs ok. Continue with current treatment plan. Repeat Lipids in 6 mos.  Richardson Dopp, PA-C   09/13/2016 5:21 PM

## 2016-09-13 NOTE — Patient Instructions (Addendum)
Medication Instructions:  Your physician recommends that you continue on your current medications as directed. Please refer to the Current Medication list given to you today.   Labwork: None Ordered   Testing/Procedures: None Ordered    Follow-Up: Your physician recommends that you schedule a follow-up appointment in: 3 months with Dr. Taylor   Any Other Special Instructions Will Be Listed Below (If Applicable).     If you need a refill on your cardiac medications before your next appointment, please call your pharmacy.   

## 2016-09-16 LAB — CUP PACEART INCLINIC DEVICE CHECK
Date Time Interrogation Session: 20180622135700
Implantable Lead Implant Date: 20110523
Implantable Lead Location: 753859
Implantable Lead Model: 346
Implantable Lead Model: 350
Implantable Lead Serial Number: 28756637
Implantable Lead Serial Number: 29491855
Implantable Pulse Generator Implant Date: 20110523
Lead Channel Impedance Value: 643 Ohm
Lead Channel Pacing Threshold Pulse Width: 0.4 ms
Lead Channel Sensing Intrinsic Amplitude: 15.3 mV
Lead Channel Sensing Intrinsic Amplitude: 2 mV
Lead Channel Setting Pacing Amplitude: 1.7 V
MDC IDC LEAD IMPLANT DT: 20141010
MDC IDC LEAD LOCATION: 753860
MDC IDC MSMT LEADCHNL RA IMPEDANCE VALUE: 702 Ohm
MDC IDC MSMT LEADCHNL RA PACING THRESHOLD AMPLITUDE: 0.6 V
MDC IDC MSMT LEADCHNL RA PACING THRESHOLD PULSEWIDTH: 0.4 ms
MDC IDC MSMT LEADCHNL RV PACING THRESHOLD AMPLITUDE: 0.6 V
MDC IDC SET LEADCHNL RV PACING AMPLITUDE: 1.6 V
MDC IDC SET LEADCHNL RV PACING PULSEWIDTH: 0.4 ms
MDC IDC STAT BRADY RA PERCENT PACED: 51 %
MDC IDC STAT BRADY RV PERCENT PACED: 0 %
Pulse Gen Serial Number: 66080052

## 2016-09-18 ENCOUNTER — Telehealth: Payer: Self-pay | Admitting: *Deleted

## 2016-09-18 NOTE — Telephone Encounter (Signed)
VM  Box full x 3. Cannot lmom. Pt has MY CHART. Labs sent to [facility].

## 2016-09-18 NOTE — Telephone Encounter (Signed)
-----   Message from Liliane Shi, [place] sent at 09/13/2016  5:22 PM EDT ----- Please call the patient Lipids close to goal. LFTs ok. Continue with current treatment plan. Repeat Lipids in 6 mos.  Richardson Dopp, PA-C   09/13/2016 5:21 PM

## 2016-09-23 ENCOUNTER — Telehealth: Payer: Self-pay | Admitting: Internal Medicine

## 2016-09-23 DIAGNOSIS — E7849 Other hyperlipidemia: Secondary | ICD-10-CM

## 2016-09-23 NOTE — Telephone Encounter (Signed)
I spoke with Kayla at lab. Pt is there and thinks she needs lipid and liver profiles done.  No orders in chart. Pt recently had these labs done in office with 6 month follow up lipid planned.  We have been unable to reach her because her voicemail is full.  I told Kayla pt did not need labs done at this time. I then spoke with pt and reviewed lab results from 6/22 with her.  I scheduled her for lipid profile on 03/20/17.

## 2016-09-23 NOTE — Addendum Note (Signed)
Addended by: Thompson Grayer on: 09/23/2016 09:27 AM   Modules accepted: Orders

## 2016-09-23 NOTE — Telephone Encounter (Signed)
Kayla from [facility] calling, states that she does not have orders for patient there.

## 2016-10-09 ENCOUNTER — Telehealth: Payer: Self-pay | Admitting: Internal Medicine

## 2016-10-09 NOTE — Telephone Encounter (Signed)
Pt do have a Hx of CAD with recent stenting however also has a hx of autonomic dysfunction and syncope and collapse.  I do not see any Hx of AS.  Doesn't appear that pt has had a RX for SL Ntg in the past.  Will forward to Dr Harrington Challenger for review and orders.

## 2016-10-09 NOTE — Telephone Encounter (Signed)
New Message   [facility] wants her to have nitro to bring to therapy with her.    *STAT* If patient is at the pharmacy, call can be transferred to refill team.   1. Which medications need to be refilled? (please list name of each medication and dose if known)  Nitroglycerin  2. Which pharmacy/location (including street and city if local pharmacy) is medication to be sent to? [facility]  Ph [phone]  Fax [phone]  9. Do they need a 30 day or 90 day supply? [place]

## 2016-10-11 MED ORDER — NITROGLYCERIN 0.4 MG SL SUBL: 0.4000 mg | SUBLINGUAL_TABLET | SUBLINGUAL | 2 refills | Status: AC | PRN

## 2016-10-11 NOTE — Telephone Encounter (Signed)
Left message for patient to call back. Sent order for RX NTG 0.4 mg PRN

## 2016-10-11 NOTE — Telephone Encounter (Signed)
OK to fill Rx for NTG 0.4 mg prn

## 2016-10-14 NOTE — Telephone Encounter (Signed)
Left detailed message at phone number [phone] [facility]) that refill for NTG has been sent to pharmacy.

## 2016-10-23 ENCOUNTER — Encounter: Payer: Self-pay | Admitting: Physician Assistant

## 2016-10-23 ENCOUNTER — Ambulatory Visit (HOSPITAL_BASED_OUTPATIENT_CLINIC_OR_DEPARTMENT_OTHER)
Admission: RE | Admit: 2016-10-23 | Discharge: 2016-10-23 | Disposition: A | Discharge status: Home / Self Care | Payer: Medicare Other | Source: Ambulatory Visit | Attending: Physician Assistant | Admitting: Physician Assistant

## 2016-10-23 DIAGNOSIS — I255 Ischemic cardiomyopathy: Secondary | ICD-10-CM | POA: Insufficient documentation

## 2016-10-23 DIAGNOSIS — I5042 Chronic combined systolic (congestive) and diastolic (congestive) heart failure: Secondary | ICD-10-CM | POA: Insufficient documentation

## 2016-10-23 LAB — ECHOCARDIOGRAM COMPLETE
Ao-asc: 32 cm
CHL CUP DOP CALC LVOT VTI: 20.9 cm
CHL CUP TV REG PEAK VELOCITY: 270 cm/s
EERAT: 15.13
EWDT: 197 ms
FS: 36 % (ref 28–44)
IVS/LV PW RATIO, ED: 0.83
LA ID, A-P, ES: 30 mm
LA diam end sys: 30 mm
LA vol A4C: 61.9 ml
LADIAMINDEX: 1.88 cm/m2
LAVOL: 70.3 mL
LAVOLIN: 44.1 mL/m2
LV E/e' medial: 15.13
LV SIMPSON'S DISK: 60
LV dias vol: 57 mL (ref 46–106)
LVDIAVOLIN: 36 mL/m2
LVEEAVG: 15.13
LVELAT: 6.74 cm/s
LVOT SV: 47 mL
LVOT area: 2.27 cm2
LVOTD: 17 mm
LVOTPV: 101 cm/s
LVSYSVOL: 23 mL (ref 14–42)
LVSYSVOLIN: 14 mL/m2
MV Dec: 197
MV Peak grad: 4 mmHg
MV pk A vel: 97 m/s
MVPKEVEL: 102 m/s
PW: 13.8 mm — AB (ref 0.6–1.1)
RV LATERAL S' VELOCITY: 11 cm/s
RV TAPSE: 16.3 mm
S' Lateral: 5.11 cm/s
Stroke v: 34 ml
TDI e' lateral: 6.74
TDI e' medial: 5.87
TR max vel: 270 cm/s

## 2016-10-23 NOTE — Progress Notes (Signed)
  Echocardiogram 2D Echocardiogram has been performed.  Meredith Gonzalez 10/23/2016, 10:25 AM

## 2016-10-28 ENCOUNTER — Telehealth: Payer: Self-pay | Admitting: Internal Medicine

## 2016-10-28 NOTE — Telephone Encounter (Signed)
Left message for pt to check [facility] Website to setup MyChart and to call us back if they have any other questions.

## 2016-10-28 NOTE — Telephone Encounter (Signed)
Pt would like to be set up with mychart

## 2016-11-10 NOTE — Progress Notes (Signed)
Cardiology Office Note   Date:  11/11/2016   ID:  Meredith Gonzalez, DOB [birth year], MRN [id number]  PCP:  Sinclair Ship, MD  Cardiologist:   Dorris Carnes, MD   No chief complaint on file.     History of Present Illness: Meredith Gonzalez is a 81 y.o. female with a history ofHTN, hypothyroidism, atrial fib, fall with subdural hematoma, tib fx and wrist fracture and syncope S/p PPM a few years ago.  Cath in 1995 No CAD LVEF 55% Nuclear stress test 2013 No ischemia. Tilt table 2014 for syncope (done in [place], signif drop in BP  I stopped dilt, amlodipine; started pindolol). Reported neg but review showed signif drop in BP t I stopped Xarelto with history of syncope/subdural hematoma Pt also followed by Beckie Salts  Had lead revision for pacer  I saw the pt last in Spring 2017  Since then she has been seen by Kathleen Argue and G taylor  She was admitted wth pneumonia in April  Trop elevated nd LVEF down  Stress test abnormal  She underwent cardiac cath in April 2018  Had 95% D1 lesion  Underwent promus stent   20% RcA lesion  LVEF 35 to 45%Echo on Aug 1 showed LVEF normal  Mild diastlic dysfunction    Pt felt great until Saturday  Then had weird feeling  Like going to faint  HR ok  No sweats  Occurred at 1 PM  Takes meds at 6 AM  Sat in chair for about 10 min   Then OK  No further spells   Sleep pattern  10:30 bed  Up at 12 AM  Got up  coudn't go back to sleep Outpatient Medications Prior to Visit  Medication Sig Dispense Refill  . acetaminophen (TYLENOL) 500 MG tablet Take 500 mg by mouth as needed for moderate pain.     Marland Kitchen amitriptyline (ELAVIL) 25 MG tablet Take 25 mg by mouth at bedtime.    Marland Kitchen ammonium lactate (LAC-HYDRIN) 12 % lotion Apply 1 application topically 2 (two) times daily.  0  . aspirin EC 81 MG tablet Take 1 tablet (81 mg total) by mouth daily. 90 tablet 3  . atorvastatin (LIPITOR) 40 MG tablet Take 1 tablet (40 mg total) by mouth daily. 90 tablet 3  . Calcium Citrate-Vitamin D3  315-250 MG-UNIT TABS Take 1 tablet by mouth 2 (two) times daily.    . clopidogrel (PLAVIX) 75 MG tablet Take 1 tablet (75 mg total) by mouth daily with breakfast. 30 tablet 3  . diphenhydrAMINE (BENADRYL ALLERGY) 25 MG tablet Take 25 mg by mouth every 8 (eight) hours as needed.    . ferrous sulfate 325 (65 FE) MG tablet Take 1 tablet (325 mg total) by mouth 2 (two) times daily with a meal. 60 tablet 0  . flintstones complete (FLINTSTONES) 60 MG chewable tablet Chew 1 tablet by mouth daily.    Marland Kitchen FLUoxetine (PROZAC) 20 MG capsule Take 60 mg by mouth daily.   0  . furosemide (LASIX) 20 MG tablet Take 1 tablet (20 mg total) by mouth 3 (three) times a week. Monday, Wednesday & Friday 30 tablet 1  . gabapentin (NEURONTIN) 300 MG capsule Take 300-600 mg by mouth 2 (two) times daily. 300 mg in the mornning; 600 mg at night    . losartan (COZAAR) 25 MG tablet Take 1 tablet (25 mg total) by mouth daily. 30 tablet 0  . Melatonin 10 MG TABS Take 40 mg by mouth at bedtime.    Marland Kitchen  Multiple Vitamins-Minerals (MULTIVITAMIN ADULT) TABS Take 1 tablet by mouth daily.    . nitroGLYCERIN (NITROSTAT) 0.4 MG SL tablet Place 1 tablet (0.4 mg total) under the tongue every 5 (five) minutes as needed for chest pain. 25 tablet 2  . pantoprazole (PROTONIX) 40 MG tablet Take 1 tablet (40 mg total) by mouth daily. Can switch to any PPI 30 tablet 3  . pindolol (VISKEN) 5 MG tablet Take 1 tablet (5 mg total) by mouth 2 (two) times daily. 60 tablet 12  . pramipexole (MIRAPEX) 0.25 MG tablet Take 0.25 mg by mouth every morning.    . vitamin B-12 (CYANOCOBALAMIN) 1000 MCG tablet Take 1 tablet (1,000 mcg total) by mouth daily. 30 tablet 0  . potassium chloride SA (KLOR-CON M20) 20 MEQ tablet Take 1 tablet (20 mEq total) by mouth 3 (three) times a week. Monday, Wednesday & Friday 30 tablet 1   No facility-administered medications prior to visit.      Allergies:   Valium [diazepam]; Darifenacin; Darvon [propoxyphene hcl]; Daypro  [oxaprozin]; Enablex [darifenacin hydrobromide er]; Propoxyphene; Risperdal [risperidone]; Talwin [pentazocine]; Vicodin [hydrocodone-acetaminophen]; Codeine; and Oxycodone   Past Medical History:  Diagnosis Date  . Anemia   . Arthritis   . Diabetes mellitus without complication ([place])   . Fibromyalgia   . Fracture of right humerus   . Generalized weakness   . History of echocardiogram    Echo 8/18: EF 50-55, no RWMA, Gr 1 DD, calcified AV leaflets, MAC, trivial MR, mod LAE, PASP 37  . Hypothyroidism   . Incontinence of urine   . Major depression, chronic   . Neuropathy   . Pacemaker   . Recurrent falls   . Stroke ([place]) 2000  . Syncope and collapse     Past Surgical History:  Procedure Laterality Date  . ABDOMINAL HYSTERECTOMY    . basal cell cancer removal    . CARPAL TUNNEL RELEASE     bilateral  . CHOLECYSTECTOMY    . CHOLECYSTECTOMY    . COLONOSCOPY    . CORONARY STENT INTERVENTION N/A 07/19/2016   Procedure: Coronary Stent Intervention;  Surgeon: Peter M Martinique, MD;  Location: [facility];  Service: Cardiovascular;  Laterality: N/A;  . EYE SURGERY     right eye catarace/lens implant  . FEMUR FRACTURE SURGERY     bilateral  . GASTRIC BYPASS    . LEAD REVISION N/A 01/01/2013   Procedure: LEAD REVISION;  Surgeon: Evans Lance, MD;  Location: [facility];  Service: Cardiovascular;  Laterality: N/A;  . LEFT HEART CATH AND CORONARY ANGIOGRAPHY N/A 07/19/2016   Procedure: Left Heart Cath and Coronary Angiography;  Surgeon: Peter M Martinique, MD;  Location: [facility];  Service: Cardiovascular;  Laterality: N/A;  . PACEMAKER INSERTION    . right ankle surgery       Social History:  The patient  reports that she quit smoking about 44 years ago. Her smoking use included Cigarettes. She has a 2.00 pack-year smoking history. She has never used smokeless tobacco. She reports that she drinks alcohol. She reports that she does not use drugs.   Family History:  The  patient's family history includes Alcoholism in her brother, brother, father, and sister; Congestive Heart Failure in her mother; Heart attack in her father; Throat cancer in her brother.    ROS:  Please see the history of present illness. All other systems are reviewed and  Negative to the above problem except as noted.  PHYSICAL EXAM: VS:  BP 138/80   Pulse 69   Ht _0  (1.676 m)   Wt 124 lb 9.6 oz (56.5 kg)   SpO2 96%   BMI 20.11 kg/m   GEN: Well nourished, well developed, in no acute distress HEENT: normal Neck: no JVD, carotid bruits, or masses Cardiac: RRR; no murmurs, rubs, or gallops,  Tr edema  Respiratory:  clear to auscultation bilaterally, normal work of breathing GI: soft, nontender, nondistended, + BS  No hepatomegaly  MS: no deformity Moving all extremities   Skin: warm and dry, no rash Neuro:  Strength and sensation are intact Psych: euthymic mood, full affect   EKG:  EKG is ordered today.  SR  Atrial paced  69  First degree AV block  PR 234 msec   LVH     Lipid Panel    Component Value Date/Time   CHOL 145 09/13/2016 0927   TRIG 63 09/13/2016 0927   HDL 58 09/13/2016 0927   CHOLHDL 2.5 09/13/2016 0927   CHOLHDL 2.7 07/18/2016 0349   VLDL 9 07/18/2016 0349   LDLCALC 74 09/13/2016 0927      Wt Readings from Last 3 Encounters:  11/11/16 124 lb 9.6 oz (56.5 kg)  09/13/16 121 lb 6.4 oz (55.1 kg)  08/07/16 117 lb 6.4 oz (53.3 kg)      ASSESSMENT AND PLAN:  1  CAD  S/p NSTEMI in Spring 2018  PTCA/DES to D1  Continue ASA and plavix  2  Dizziness  1 spell  Follow for recurrence    3   Bradycardia  S/p PPM  Followed by Beckie Salts    4.  Afib  No clinical recurrence.  Not an anticoag candidate due to subdural hematoma    5  HL  COntinue statin  F/U in Jan 2019  Signed, Dorris Carnes, MD  11/11/2016 9:29 AM    [address], [place], [place]  [address] Phone: [phone]; Fax: [phone]

## 2016-11-11 ENCOUNTER — Ambulatory Visit (INDEPENDENT_AMBULATORY_CARE_PROVIDER_SITE_OTHER): Payer: Medicare Other | Admitting: Internal Medicine

## 2016-11-11 ENCOUNTER — Encounter: Payer: Self-pay | Admitting: Internal Medicine

## 2016-11-11 VITALS — BP 138/80 | HR 69 | Ht 66.0 in | Wt 124.6 lb

## 2016-11-11 DIAGNOSIS — R42 Dizziness and giddiness: Secondary | ICD-10-CM

## 2016-11-11 DIAGNOSIS — I2510 Atherosclerotic heart disease of native coronary artery without angina pectoris: Secondary | ICD-10-CM | POA: Diagnosis not present

## 2016-11-11 DIAGNOSIS — I480 Paroxysmal atrial fibrillation: Secondary | ICD-10-CM | POA: Diagnosis not present

## 2016-11-11 DIAGNOSIS — Z950 Presence of cardiac pacemaker: Secondary | ICD-10-CM | POA: Diagnosis not present

## 2016-11-11 DIAGNOSIS — E785 Hyperlipidemia, unspecified: Secondary | ICD-10-CM

## 2016-11-11 DIAGNOSIS — I255 Ischemic cardiomyopathy: Secondary | ICD-10-CM

## 2016-11-11 LAB — CBC
HEMOGLOBIN: 11 g/dL — AB (ref 11.1–15.9)
Hematocrit: 35.1 % (ref 34.0–46.6)
MCH: 29.5 pg (ref 26.6–33.0)
MCHC: 31.3 g/dL — ABNORMAL LOW (ref 31.5–35.7)
MCV: 94 fL (ref 79–97)
PLATELETS: 244 10*3/uL (ref 150–379)
RBC: 3.73 x10E6/uL — AB (ref 3.77–5.28)
RDW: 13.9 % (ref 12.3–15.4)
WBC: 6.9 10*3/uL (ref 3.4–10.8)

## 2016-11-11 NOTE — Patient Instructions (Signed)
Your physician recommends that you continue on your current medications as directed. Please refer to the Current Medication list given to you today.  Your physician recommends that you return for lab work TODAY: CBC  Your physician wants you to follow-up around the end of January, 2019 with Dr. Harrington Challenger.  You will receive a reminder letter in the mail two months in advance. If you don't receive a letter, please call our office to schedule the follow-up appointment.

## 2016-12-16 ENCOUNTER — Encounter: Payer: Self-pay | Admitting: Internal Medicine

## 2016-12-16 ENCOUNTER — Ambulatory Visit (INDEPENDENT_AMBULATORY_CARE_PROVIDER_SITE_OTHER): Payer: Medicare Other | Admitting: Internal Medicine

## 2016-12-16 VITALS — BP 118/76 | HR 74 | Resp 16 | Ht 66.0 in | Wt 132.8 lb

## 2016-12-16 DIAGNOSIS — I255 Ischemic cardiomyopathy: Secondary | ICD-10-CM

## 2016-12-16 DIAGNOSIS — R001 Bradycardia, unspecified: Secondary | ICD-10-CM | POA: Diagnosis not present

## 2016-12-16 DIAGNOSIS — I10 Essential (primary) hypertension: Secondary | ICD-10-CM | POA: Diagnosis not present

## 2016-12-16 LAB — CUP PACEART INCLINIC DEVICE CHECK
Brady Statistic RA Percent Paced: 46 %
Brady Statistic RV Percent Paced: 0 %
Implantable Lead Implant Date: 20141010
Implantable Lead Location: 753860
Implantable Lead Model: 346
Implantable Lead Serial Number: 29491855
Implantable Pulse Generator Implant Date: 20110523
Lead Channel Pacing Threshold Amplitude: 0.7 V
Lead Channel Pacing Threshold Amplitude: 0.7 V
Lead Channel Pacing Threshold Pulse Width: 0.4 ms
Lead Channel Sensing Intrinsic Amplitude: 1.6 mV
Lead Channel Sensing Intrinsic Amplitude: 1.6 mV
Lead Channel Sensing Intrinsic Amplitude: 13.8 mV
Lead Channel Setting Pacing Amplitude: 1.7 V
Lead Channel Setting Pacing Amplitude: 1.7 V
Lead Channel Setting Pacing Pulse Width: 0.4 ms
MDC IDC LEAD IMPLANT DT: 20110523
MDC IDC LEAD LOCATION: 753859
MDC IDC LEAD SERIAL: 28756637
MDC IDC MSMT BATTERY REMAINING LONGEVITY: 66 mo
MDC IDC MSMT LEADCHNL RA IMPEDANCE VALUE: 663 Ohm
MDC IDC MSMT LEADCHNL RA PACING THRESHOLD AMPLITUDE: 0.7 V
MDC IDC MSMT LEADCHNL RA PACING THRESHOLD AMPLITUDE: 0.7 V
MDC IDC MSMT LEADCHNL RA PACING THRESHOLD PULSEWIDTH: 0.4 ms
MDC IDC MSMT LEADCHNL RV IMPEDANCE VALUE: 604 Ohm
MDC IDC MSMT LEADCHNL RV PACING THRESHOLD PULSEWIDTH: 0.4 ms
MDC IDC MSMT LEADCHNL RV PACING THRESHOLD PULSEWIDTH: 0.4 ms
MDC IDC PG SERIAL: 66080052
MDC IDC SESS DTM: 20180924150723

## 2016-12-16 NOTE — Progress Notes (Signed)
HPI [name] returns today for ongoing evaluation of hypertension, atrial fibrillation, sinus node dysfunction status post permanent pacemaker insertion. In the interim, the patient has had a subdural hematoma and had to stop taking her oral anticoagulants. She has no anginal symptoms. She has had no chest pain, and has minimal palpitations. Her ejection fraction is approximately 40% by echo. Her dizziness has improved but still present. Allergies  Allergen Reactions  . Valium [Diazepam] Anxiety    Makes patient hyper Makes patient hyper  . Darifenacin Itching    Can take with Benadryl  . Darvon [Propoxyphene Hcl] Itching    Can take with Benadryl  . Daypro [Oxaprozin] Itching    Can take with Benadryl Can take with Benadryl  . Enablex [Darifenacin Hydrobromide Er] Itching    Can take with Benadryl  . Propoxyphene Itching    Can take with Benadryl  . Risperdal [Risperidone] Itching    Can take with Benadryl Can take with Benadryl  . Talwin [Pentazocine] Itching    Can take with Benadryl Can take with Benadryl  . Vicodin [Hydrocodone-Acetaminophen] Itching    Can take with Benadryl Can take with Benadryl  . Codeine Hives and Itching    Can take with Benadryl Can take with Benadryl  . Oxycodone Itching    Can take with Benadryl Can take with Benadryl Can take with Benadryl Can take with Benadryl     Current Outpatient Prescriptions  Medication Sig Dispense Refill  . acetaminophen (TYLENOL) 500 MG tablet Take 500 mg by mouth as needed for moderate pain.     Marland Kitchen amitriptyline (ELAVIL) 25 MG tablet Take 25 mg by mouth at bedtime.    Marland Kitchen ammonium lactate (LAC-HYDRIN) 12 % lotion Apply 1 application topically 2 (two) times daily.  0  . aspirin EC 81 MG tablet Take 1 tablet (81 mg total) by mouth daily. 90 tablet 3  . Calcium Citrate-Vitamin D3 315-250 MG-UNIT TABS Take 1 tablet by mouth 2 (two) times daily.    . clopidogrel (PLAVIX) 75 MG tablet Take 1 tablet (75 mg total)  by mouth daily with breakfast. 30 tablet 3  . diphenhydrAMINE (BENADRYL ALLERGY) 25 MG tablet Take 25 mg by mouth every 8 (eight) hours as needed.    . ferrous sulfate 325 (65 FE) MG tablet Take 1 tablet (325 mg total) by mouth 2 (two) times daily with a meal. 60 tablet 0  . flintstones complete (FLINTSTONES) 60 MG chewable tablet Chew 1 tablet by mouth daily.    Marland Kitchen FLUoxetine (PROZAC) 20 MG capsule Take 60 mg by mouth daily.   0  . furosemide (LASIX) 20 MG tablet Take 1 tablet (20 mg total) by mouth 3 (three) times a week. Monday, Wednesday & Friday 30 tablet 1  . gabapentin (NEURONTIN) 300 MG capsule Take 300-600 mg by mouth 2 (two) times daily. 300 mg in the mornning; 600 mg at night    . losartan (COZAAR) 25 MG tablet Take 1 tablet (25 mg total) by mouth daily. 30 tablet 0  . Melatonin 10 MG TABS Take 40 mg by mouth at bedtime.    . Multiple Vitamins-Minerals (MULTIVITAMIN ADULT) TABS Take 1 tablet by mouth daily.    . nitroGLYCERIN (NITROSTAT) 0.4 MG SL tablet Place 1 tablet (0.4 mg total) under the tongue every 5 (five) minutes as needed for chest pain. 25 tablet 2  . pantoprazole (PROTONIX) 40 MG tablet Take 1 tablet (40 mg total) by mouth daily. Can switch  to any PPI 30 tablet 3  . pindolol (VISKEN) 5 MG tablet Take 1 tablet (5 mg total) by mouth 2 (two) times daily. 60 tablet 12  . pramipexole (MIRAPEX) 0.25 MG tablet Take 0.25 mg by mouth every morning.    . vitamin B-12 (CYANOCOBALAMIN) 1000 MCG tablet Take 1 tablet (1,000 mcg total) by mouth daily. 30 tablet 0  . atorvastatin (LIPITOR) 40 MG tablet Take 1 tablet (40 mg total) by mouth daily. 90 tablet 3  . potassium chloride SA (KLOR-CON M20) 20 MEQ tablet Take 1 tablet (20 mEq total) by mouth 3 (three) times a week. Monday, Wednesday & Friday 30 tablet 1   No current facility-administered medications for this visit.      Past Medical History:  Diagnosis Date  . Anemia   . Arthritis   . Diabetes mellitus without complication  ([place])   . Fibromyalgia   . Fracture of right humerus   . Generalized weakness   . History of echocardiogram    Echo 8/18: EF 50-55, no RWMA, Gr 1 DD, calcified AV leaflets, MAC, trivial MR, mod LAE, PASP 37  . Hypothyroidism   . Incontinence of urine   . Major depression, chronic   . Neuropathy   . Pacemaker   . Recurrent falls   . Stroke ([place]) 2000  . Syncope and collapse     ROS:   All systems reviewed and negative except as noted in the HPI.   Past Surgical History:  Procedure Laterality Date  . ABDOMINAL HYSTERECTOMY    . basal cell cancer removal    . CARPAL TUNNEL RELEASE     bilateral  . CHOLECYSTECTOMY    . CHOLECYSTECTOMY    . COLONOSCOPY    . CORONARY STENT INTERVENTION N/A 07/19/2016   Procedure: Coronary Stent Intervention;  Surgeon: Peter M Martinique, MD;  Location: [facility];  Service: Cardiovascular;  Laterality: N/A;  . EYE SURGERY     right eye catarace/lens implant  . FEMUR FRACTURE SURGERY     bilateral  . GASTRIC BYPASS    . LEAD REVISION N/A 01/01/2013   Procedure: LEAD REVISION;  Surgeon: Evans Lance, MD;  Location: [facility];  Service: Cardiovascular;  Laterality: N/A;  . LEFT HEART CATH AND CORONARY ANGIOGRAPHY N/A 07/19/2016   Procedure: Left Heart Cath and Coronary Angiography;  Surgeon: Peter M Martinique, MD;  Location: [facility];  Service: Cardiovascular;  Laterality: N/A;  . PACEMAKER INSERTION    . right ankle surgery       Family History  Problem Relation Age of Onset  . Congestive Heart Failure Mother   . Heart attack Father   . Alcoholism Father   . Alcoholism Sister   . Alcoholism Brother   . Alcoholism Brother   . Throat cancer Brother      Social History   Social History  . Marital status: Widowed    Spouse name: N/A  . Number of children: N/A  . Years of education: N/A   Occupational History  . Not on file.   Social History Main Topics  . Smoking status: Former Smoker    Packs/day: 0.50     Years: 4.00    Types: Cigarettes    Quit date: 08/07/1972  . Smokeless tobacco: Never Used  . Alcohol use Yes     Comment: wine cooler once or twice per year  . Drug use: No  . Sexual activity: No   Other Topics Concern  .  Not on file   Social History Narrative  . No narrative on file     BP 118/76   Pulse 74   Resp 16   Ht 5' 6"  (1.676 m)   Wt 132 lb 12.8 oz (60.2 kg)   SpO2 95%   BMI 21.43 kg/m   Physical Exam:  Well appearing 81 year old woman, NAD HEENT: Unremarkable Neck:  6 cm JVD, no thyromegally Lymphatics:  No adenopathy Back:  No CVA tenderness Lungs:  Clear, with no wheezes, rales, or rhonchi. HEART:  Regular rate rhythm, no murmurs, no rubs, no clicks Abd:  soft, positive bowel sounds, no organomegally, no rebound, no guarding Ext:  2 plus pulses, trace peripheral edema, no cyanosis, no clubbing Skin:  No rashes no nodules Neuro:  CN II through XII intact, motor grossly intact   DEVICE  Normal device function.  See PaceArt for details.   Assess/Plan: 1. Sinus node dysfunction - she is asymptomatic status post pacemaker insertion 2. Pacemaker - her Biotronik dual-chamber pacemaker has been interrogated today and working normally. She is pacing approximately half time in the atrium, none in the ventricle. We'll recheck in several months. 3. Coronary artery disease - she is status post stenting and continues to do well with no anginal symptoms. 4. Paroxysmal atrial fibrillation - the patient is maintaining sinus rhythm. She is not a candidate for systemic anticoagulation due to her prior subdural hematoma  Cristopher Peru, M.D.

## 2016-12-16 NOTE — Patient Instructions (Signed)
Medication Instructions:  Your physician recommends that you continue on your current medications as directed. Please refer to the Current Medication list given to you today.  Labwork: None ordered.  Testing/Procedures: None ordered.  Follow-Up: Your physician wants you to follow-up in: one year with Dr. Lovena Le.   You will receive a reminder letter in the mail two months in advance. If you don't receive a letter, please call our office to schedule the follow-up appointment.  Remote monitoring is used to monitor your Pacemaker from home. This monitoring reduces the number of office visits required to check your device to one time per year. It allows [place] to keep an eye on the functioning of your device to ensure it is working properly. You are scheduled for a device check from home on 03/19/2017. You may send your transmission at any time that day. If you have a wireless device, the transmission will be sent automatically. After your physician reviews your transmission, you will receive a postcard with your next transmission date.    Any Other Special Instructions Will Be Listed Below (If Applicable).     If you need a refill on your cardiac medications before your next appointment, please call your pharmacy.

## 2017-01-30 ENCOUNTER — Telehealth: Payer: Self-pay | Admitting: Internal Medicine

## 2017-01-30 NOTE — Telephone Encounter (Signed)
New message   Pt verbalized that Dr.Taylor told pt to put her seatbelt on and to put it under arm instead of across her chest   Pt got a ticket yesterday for the improper position of seatbelt  The pt want a note stating that Dr.Taylor instructed pt to do so because of   her pacemaker and the discomfort that it caused so the ticket can be solved

## 2017-02-06 NOTE — Telephone Encounter (Signed)
Returned call to Pt.  Detailed message left per DPR.  Notified that when driving seatbelt must be in place as dictated by law.  If Pt chooses to wear seatbelt differently she will be ticketed if stopped by Event organiser.  No letter from Dr. Lovena Le will be helpful for Pt per Dr. Lovena Le in his experience.  Notified Pt that if she is unable to wear seatbelt as indicated by law she should not drive.  Apologized.   Notified to call office if any further questions.

## 2017-02-14 ENCOUNTER — Other Ambulatory Visit: Payer: Self-pay

## 2017-02-14 ENCOUNTER — Encounter (HOSPITAL_BASED_OUTPATIENT_CLINIC_OR_DEPARTMENT_OTHER): Payer: Self-pay

## 2017-02-14 ENCOUNTER — Emergency Department (HOSPITAL_BASED_OUTPATIENT_CLINIC_OR_DEPARTMENT_OTHER): Payer: Medicare Other

## 2017-02-14 ENCOUNTER — Emergency Department (HOSPITAL_BASED_OUTPATIENT_CLINIC_OR_DEPARTMENT_OTHER)
Admission: EM | Admit: 2017-02-14 | Discharge: 2017-02-14 | Disposition: A | Discharge status: Home / Self Care | Payer: Medicare Other | Attending: Physician Assistant | Admitting: Physician Assistant

## 2017-02-14 DIAGNOSIS — Z87891 Personal history of nicotine dependence: Secondary | ICD-10-CM | POA: Diagnosis not present

## 2017-02-14 DIAGNOSIS — I5042 Chronic combined systolic (congestive) and diastolic (congestive) heart failure: Secondary | ICD-10-CM | POA: Insufficient documentation

## 2017-02-14 DIAGNOSIS — W2209XA Striking against other stationary object, initial encounter: Secondary | ICD-10-CM | POA: Insufficient documentation

## 2017-02-14 DIAGNOSIS — I2510 Atherosclerotic heart disease of native coronary artery without angina pectoris: Secondary | ICD-10-CM | POA: Insufficient documentation

## 2017-02-14 DIAGNOSIS — T148XXA Other injury of unspecified body region, initial encounter: Secondary | ICD-10-CM

## 2017-02-14 DIAGNOSIS — S60413A Abrasion of left middle finger, initial encounter: Secondary | ICD-10-CM | POA: Diagnosis not present

## 2017-02-14 DIAGNOSIS — Y93K9 Activity, other involving animal care: Secondary | ICD-10-CM | POA: Insufficient documentation

## 2017-02-14 DIAGNOSIS — E119 Type 2 diabetes mellitus without complications: Secondary | ICD-10-CM | POA: Insufficient documentation

## 2017-02-14 DIAGNOSIS — I110 Hypertensive heart disease with heart failure: Secondary | ICD-10-CM | POA: Insufficient documentation

## 2017-02-14 DIAGNOSIS — Z950 Presence of cardiac pacemaker: Secondary | ICD-10-CM | POA: Insufficient documentation

## 2017-02-14 DIAGNOSIS — Y929 Unspecified place or not applicable: Secondary | ICD-10-CM | POA: Insufficient documentation

## 2017-02-14 DIAGNOSIS — Z23 Encounter for immunization: Secondary | ICD-10-CM | POA: Diagnosis not present

## 2017-02-14 DIAGNOSIS — S6992XA Unspecified injury of left wrist, hand and finger(s), initial encounter: Secondary | ICD-10-CM

## 2017-02-14 DIAGNOSIS — Y998 Other external cause status: Secondary | ICD-10-CM | POA: Insufficient documentation

## 2017-02-14 DIAGNOSIS — Z8673 Personal history of transient ischemic attack (TIA), and cerebral infarction without residual deficits: Secondary | ICD-10-CM | POA: Insufficient documentation

## 2017-02-14 MED ORDER — CEPHALEXIN 500 MG PO CAPS: 500.0000 mg | ORAL_CAPSULE | Freq: Four times a day (QID) | ORAL | 0 refills | Status: DC

## 2017-02-14 MED ORDER — TETANUS-DIPHTH-ACELL PERTUSSIS 5-2.5-18.5 LF-MCG/0.5 IM SUSP
0.5000 mL | Freq: Once | INTRAMUSCULAR | Status: AC
  Administered 2017-02-14: 0.5 mL via INTRAMUSCULAR
  Filled 2017-02-14: qty 0.5

## 2017-02-14 MED ORDER — BACITRACIN ZINC 500 UNIT/GM EX OINT
TOPICAL_OINTMENT | Freq: Once | CUTANEOUS | Status: AC
  Administered 2017-02-14: 17:00:00 via TOPICAL
  Filled 2017-02-14: qty 28.35

## 2017-02-14 NOTE — ED Triage Notes (Signed)
Pt states family dog knocked her left middle finger into a table yesterday-skin tear, bruising noted-NAD-steady gait

## 2017-02-14 NOTE — Discharge Instructions (Signed)
Your x-ray showed no signs of a fracture.  Your tetanus shot has been updated.  Please keep the area clean.  Wear the splint for mobility.  Start taking antibiotics to prevent infection.  Follow-up with the hand doctor if symptoms not improving.  Also follow-up with a primary care doctor.  Return to ED with any worsening symptoms.

## 2017-02-14 NOTE — ED Provider Notes (Signed)
[facility]ERG[facility] DEPARTMENT Provider Note   CSN: [id number] Arrival date & time: 02/14/17  1434     History   Chief Complaint Chief Complaint  Patient presents with  . Finger Injury    HPI Meredith Gonzalez is a 81 y.o. female.  HPI 81 year old Caucasian female with past medical history significant for diabetes presents to the ED with complaints of left middle finger injury yesterday.  Patient states that she was petting her dog when he lifted his head up and she scratched her left middle finger on the table.  She has a skin abrasion noted.  Patient denies any associated bleeding.  She reports associated ecchymosis and pain with movement of the left middle finger.  Patient is unsure when her last tetanus shot was.  Denies any associated fevers, chills, nausea, vomiting.  She is not taking the pain prior to arrival.  Palpation and movement make the pain worse. Past Medical History:  Diagnosis Date  . Anemia   . Arthritis   . Diabetes mellitus without complication ([place])   . Fibromyalgia   . Fracture of right humerus   . Generalized weakness   . History of echocardiogram    Echo 8/18: EF 50-55, no RWMA, Gr 1 DD, calcified AV leaflets, MAC, trivial MR, mod LAE, PASP 37  . Hypothyroidism   . Incontinence of urine   . Major depression, chronic   . Neuropathy   . Pacemaker   . Recurrent falls   . Stroke ([place]) 2000  . Syncope and collapse     Patient Active Problem List   Diagnosis Date Noted  . CAD (coronary artery disease) 08/07/2016  . Chronic combined systolic and diastolic CHF (congestive heart failure) ([place]) 08/07/2016  . Ischemic cardiomyopathy 08/07/2016  . Chest pain   . NSTEMI (non-ST elevated myocardial infarction) ([place])   . Status post coronary artery stent placement   . Abnormal stress test   . SOB (shortness of breath)   . Multifocal pneumonia 07/13/2016  . Elevated troponin 07/13/2016  . Leukocytosis   . Pacemaker 05/17/2014  . Complications,  pacemaker cardiac, mechanical 12/31/2012  . Edema extremities 10/08/2012  . PAF (paroxysmal atrial fibrillation) ([place]) 10/08/2012  . Hypertension 10/08/2012  . Neuropathy 10/08/2012  . Diabetes ([place]) 10/08/2012  . Depression 09/08/2012  . Anemia 08/08/2012  . Knee fracture, left 08/07/2012  . Recurrent falls 08/07/2012  . Syncope 08/07/2012  . SDH (subdural hematoma) ([place]) 08/07/2012    Past Surgical History:  Procedure Laterality Date  . ABDOMINAL HYSTERECTOMY    . basal cell cancer removal    . CARPAL TUNNEL RELEASE     bilateral  . CHOLECYSTECTOMY    . CHOLECYSTECTOMY    . COLONOSCOPY    . CORONARY STENT INTERVENTION N/A 07/19/2016   Procedure: Coronary Stent Intervention;  Surgeon: Peter M Martinique, MD;  Location: [facility];  Service: Cardiovascular;  Laterality: N/A;  . EYE SURGERY     right eye catarace/lens implant  . FEMUR FRACTURE SURGERY     bilateral  . GASTRIC BYPASS    . LEAD REVISION N/A 01/01/2013   Procedure: LEAD REVISION;  Surgeon: Evans Lance, MD;  Location: [facility] CATH LAB;  Service: Cardiovascular;  Laterality: N/A;  . LEFT HEART CATH AND CORONARY ANGIOGRAPHY N/A 07/19/2016   Procedure: Left Heart Cath and Coronary Angiography;  Surgeon: Peter M Martinique, MD;  Location: [facility] CV LAB;  Service: Cardiovascular;  Laterality: N/A;  . PACEMAKER INSERTION    .  right ankle surgery      OB History    No data available       Home Medications    Prior to Admission medications   Medication Sig Start Date End Date Taking? Authorizing Provider  acetaminophen (TYLENOL) 500 MG tablet Take 500 mg by mouth as needed for moderate pain.     [provider]  amitriptyline (ELAVIL) 25 MG tablet Take 25 mg by mouth at bedtime.    [provider]  ammonium lactate (LAC-HYDRIN) 12 % lotion Apply 1 application topically 2 (two) times daily. 07/05/16   [provider]  aspirin EC 81 MG tablet Take 1 tablet (81 mg total) by mouth daily.  03/29/13   Fay Records, MD  atorvastatin (LIPITOR) 40 MG tablet Take 1 tablet (40 mg total) by mouth daily. 08/07/16 11/11/16  Richardson Dopp T, PA-C  Calcium Citrate-Vitamin D3 315-250 MG-UNIT TABS Take 1 tablet by mouth 2 (two) times daily.    [provider]  cephALEXin (KEFLEX) 500 MG capsule Take 1 capsule (500 mg total) by mouth 4 (four) times daily. 02/14/17   Doristine Devoid, PA-C  clopidogrel (PLAVIX) 75 MG tablet Take 1 tablet (75 mg total) by mouth daily with breakfast. 07/21/16   Thurnell Lose, MD  diphenhydrAMINE (BENADRYL ALLERGY) 25 MG tablet Take 25 mg by mouth every 8 (eight) hours as needed.    [provider]  ferrous sulfate 325 (65 FE) MG tablet Take 1 tablet (325 mg total) by mouth 2 (two) times daily with a meal. 07/20/16   Thurnell Lose, MD  flintstones complete (FLINTSTONES) 60 MG chewable tablet Chew 1 tablet by mouth daily.    [provider]  FLUoxetine (PROZAC) 20 MG capsule Take 60 mg by mouth daily.  09/12/15   [provider]  furosemide (LASIX) 20 MG tablet Take 1 tablet (20 mg total) by mouth 3 (three) times a week. Monday, Wednesday & Friday 08/09/16   Richardson Dopp T, PA-C  gabapentin (NEURONTIN) 300 MG capsule Take 300-600 mg by mouth 2 (two) times daily. 300 mg in the mornning; 600 mg at night    [provider]  losartan (COZAAR) 25 MG tablet Take 1 tablet (25 mg total) by mouth daily. 07/20/16   Thurnell Lose, MD  Melatonin 10 MG TABS Take 40 mg by mouth at bedtime.    [provider]  Multiple Vitamins-Minerals (MULTIVITAMIN ADULT) TABS Take 1 tablet by mouth daily.    [provider]  nitroGLYCERIN (NITROSTAT) 0.4 MG SL tablet Place 1 tablet (0.4 mg total) under the tongue every 5 (five) minutes as needed for chest pain. 10/11/16 01/09/17  Fay Records, MD  pantoprazole (PROTONIX) 40 MG tablet Take 1 tablet (40 mg total) by mouth daily. Can switch to any PPI 07/20/16   Thurnell Lose, MD    pindolol (VISKEN) 5 MG tablet Take 1 tablet (5 mg total) by mouth 2 (two) times daily. 03/01/13   Fay Records, MD  potassium chloride SA (KLOR-CON M20) 20 MEQ tablet Take 1 tablet (20 mEq total) by mouth 3 (three) times a week. Monday, Wednesday & Friday 08/09/16 11/07/16  Richardson Dopp T, PA-C  pramipexole (MIRAPEX) 0.25 MG tablet Take 0.25 mg by mouth every morning. 05/17/16   [provider]  vitamin B-12 (CYANOCOBALAMIN) 1000 MCG tablet Take 1 tablet (1,000 mcg total) by mouth daily. 07/20/16   Thurnell Lose, MD    Family History Family History  Problem Relation Age of Onset  . Congestive Heart Failure Mother   . Heart attack Father   . Alcoholism Father   . Alcoholism Sister   . Alcoholism Brother   . Alcoholism Brother   . Throat cancer Brother     Social History Social History   Tobacco Use  . Smoking status: Former Smoker    Packs/day: 0.50    Years: 4.00    Pack years: 2.00    Types: Cigarettes    Last attempt to quit: 08/07/1972    Years since quitting: 44.5  . Smokeless tobacco: Never Used  Substance Use Topics  . Alcohol use: No    Frequency: Never  . Drug use: No     Allergies   Valium [diazepam]; Darifenacin; Darvon [propoxyphene hcl]; Daypro [oxaprozin]; Enablex [darifenacin hydrobromide er]; Propoxyphene; Risperdal [risperidone]; Talwin [pentazocine]; Vicodin [hydrocodone-acetaminophen]; Codeine; and Oxycodone   Review of Systems Review of Systems  Constitutional: Negative for chills and fever.  Musculoskeletal: Positive for arthralgias, joint swelling and myalgias.  Skin: Positive for color change and wound.  Neurological: Negative for weakness and numbness.     Physical Exam Updated Vital Signs BP (!) 173/70 (BP Location: Left Arm)   Pulse 69   Temp 97.8 F (36.6 C) (Oral)   Resp 18   Ht 5' 6"  (1.676 m)   Wt 58.5 kg (129 lb)   SpO2 99%   BMI 20.82 kg/m   Physical Exam  Constitutional: She appears well-developed and  well-nourished. No distress.  HENT:  Head: Normocephalic and atraumatic.  Eyes: Right eye exhibits no discharge. Left eye exhibits no discharge. No scleral icterus.  Neck: Normal range of motion.  Cardiovascular: Intact distal pulses.  Pulmonary/Chest: No respiratory distress.  Musculoskeletal: Normal range of motion.  Patient has small skin tear to the left middle finger over the dorsal aspect between the MCP and PIP.  No bleeding at this time.  Mild erythema.  No purulent drainage.  She does have ecchymosis of the left middle finger.  Limited range of motion due to pain.  Brisk cap refill.  Sensation intact.  Radial pulses 2+ bilaterally.  No scaphoid tenderness.  Neurological: She is alert.  Skin: Skin is warm and dry. Capillary refill takes less than 2 seconds. No pallor.  Psychiatric: Her behavior is normal. Judgment and thought content normal.  Nursing note and vitals reviewed.    ED Treatments / Results  Labs (all labs ordered are listed, but only abnormal results are displayed) Labs Reviewed - No data to display  EKG  EKG Interpretation None       Radiology Dg Finger Middle Left  Result Date: 02/14/2017 CLINICAL DATA:  81 year old female with left middle finger pain, bruising and swelling since injury 2 days ago. EXAM: LEFT MIDDLE FINGER 2+V COMPARISON:  None. FINDINGS: Note is made of diffuse osteopenia. There is no evidence of fracture or dislocation. There is no evidence of arthropathy or other focal bone abnormality. Soft tissues are unremarkable. IMPRESSION: Diffuse osteopenia without evidence for acute osseous abnormality. Electronically Signed   By: Kristopher Oppenheim M.D.   On: 02/14/2017 15:13    Procedures Procedures (including critical care time)  Medications Ordered in ED Medications  bacitracin ointment ( Topical Given 02/14/17 1650)  Tdap (BOOSTRIX) injection 0.5 mL (0.5 mLs Intramuscular Given 02/14/17 1648)     Initial Impression / Assessment and Plan  / ED Course  I have reviewed the triage vital signs and the nursing notes.  Pertinent labs &  imaging results that were available during my care of the patient were reviewed by me and considered in my medical decision making (see chart for details).     Patient presents to the ED with complaints of left middle finger pain and abrasion that happened yesterday.  Patient is neurovascularly intact.  Limited range of motion due to the pain.  She does have some mild erythema over the abrasion but no signs of significant infection at this time.  No systemic symptoms.  X-ray reveals no fracture.  Patient tetanus shot was updated in the ED.  Wound was cleaned and dressed.  Static finger splint was applied.  Patient was started on antibiotics to prevent infection given history of diabetes and her age.  Have given hand follow-up.  Pt is hemodynamically stable, in NAD, & able to ambulate in the ED. Evaluation does not show pathology that would require ongoing emergent intervention or inpatient treatment. I explained the diagnosis to the patient. Pain has been managed & has no complaints prior to dc. Pt is comfortable with above plan and is stable for discharge at this time. All questions were answered prior to disposition. Strict return precautions for f/u to the ED were discussed. Encouraged follow up with PCP.  Pt dicussed with Dr. Sandi Mariscal who is agreeable with the above plan.    Final Clinical Impressions(s) / ED Diagnoses   Final diagnoses:  Injury of finger of left hand, initial encounter  Abrasion    ED Discharge Orders        Ordered    cephALEXin (KEFLEX) 500 MG capsule  4 times daily     02/14/17 1657       Doristine Devoid, PA-C 02/14/17 1722    [place], Fredia Sorrow, MD 02/14/17 [phone]

## 2017-03-19 ENCOUNTER — Ambulatory Visit (INDEPENDENT_AMBULATORY_CARE_PROVIDER_SITE_OTHER): Payer: Medicare Other | Admitting: *Deleted

## 2017-03-19 DIAGNOSIS — R001 Bradycardia, unspecified: Secondary | ICD-10-CM | POA: Diagnosis not present

## 2017-03-20 ENCOUNTER — Other Ambulatory Visit: Payer: Medicare Other | Admitting: *Deleted

## 2017-03-20 ENCOUNTER — Encounter: Payer: Self-pay | Admitting: Cardiology

## 2017-03-20 ENCOUNTER — Telehealth: Payer: Self-pay | Admitting: *Deleted

## 2017-03-20 DIAGNOSIS — E7849 Other hyperlipidemia: Secondary | ICD-10-CM

## 2017-03-20 LAB — LIPID PANEL
CHOLESTEROL TOTAL: 95 mg/dL — AB (ref 100–199)
Chol/HDL Ratio: 1.6 ratio (ref 0.0–4.4)
HDL: 58 mg/dL (ref >39)
LDL CALC: 28 mg/dL (ref 0–99)
Triglycerides: 45 mg/dL (ref 0–149)
VLDL CHOLESTEROL CAL: 9 mg/dL (ref 5–40)

## 2017-03-20 NOTE — Progress Notes (Signed)
Remote pacemaker transmission.   

## 2017-03-20 NOTE — Telephone Encounter (Signed)
Lmtcb to go over lab results. Results have been sent to [facility] as well.

## 2017-03-20 NOTE — Telephone Encounter (Signed)
-----   Message from Liliane Shi, [place] sent at 03/20/2017  4:32 PM EST ----- Please call the patient Lipids at goal. The current medical regimen is effective;  continue present plan and medications.  Richardson Dopp, PA-C    03/20/2017 4:32 PM

## 2017-03-21 ENCOUNTER — Telehealth: Payer: Self-pay

## 2017-03-21 NOTE — Telephone Encounter (Signed)
Pt walked in requesting letter stating Dr. Lovena Le states she can wear her seat belt under her arm d/t her PPM.  Notified Pt that Dr. Lovena Le does not write letters stating Pt can wear seatbelt under arm.  Notified that seatbelt must be worn per the law.  If Pt caught wearing seat belt alternatively pt will get a ticket.  Dr. Lovena Le advised Pt's of this when they discuss seatbelts.  Left cvm for Pt notifiying of above.  Left this nurse name and # for call back if any further questions.

## 2017-03-21 NOTE — Telephone Encounter (Signed)
Results sent to [place]

## 2017-03-21 NOTE — Telephone Encounter (Signed)
-----   Message from Liliane Shi, [place] sent at 03/20/2017  4:32 PM EST ----- Please call the patient Lipids at goal. The current medical regimen is effective;  continue present plan and medications.  Richardson Dopp, PA-C    03/20/2017 4:32 PM

## 2017-03-31 LAB — CUP PACEART REMOTE DEVICE CHECK
Brady Statistic AP VS Percent: 41 %
Brady Statistic RA Percent Paced: 42 %
Brady Statistic RV Percent Paced: 0 %
Date Time Interrogation Session: 20190107050429
Implantable Lead Location: 753859
Implantable Lead Location: 753860
Implantable Lead Model: 350
Implantable Lead Serial Number: 29491855
Implantable Pulse Generator Implant Date: 20110523
Lead Channel Pacing Threshold Amplitude: 0.7 V
Lead Channel Pacing Threshold Pulse Width: 0.4 ms
Lead Channel Pacing Threshold Pulse Width: 0.4 ms
Lead Channel Setting Pacing Pulse Width: 0.4 ms
MDC IDC LEAD IMPLANT DT: 20110523
MDC IDC LEAD IMPLANT DT: 20141010
MDC IDC LEAD SERIAL: 28756637
MDC IDC MSMT LEADCHNL RA IMPEDANCE VALUE: 638 Ohm
MDC IDC MSMT LEADCHNL RV IMPEDANCE VALUE: 619 Ohm
MDC IDC MSMT LEADCHNL RV PACING THRESHOLD AMPLITUDE: 0.6 V
MDC IDC PG SERIAL: 66080052
MDC IDC STAT BRADY AP VP PERCENT: 0 %
MDC IDC STAT BRADY AS VP PERCENT: 0 %
MDC IDC STAT BRADY AS VS PERCENT: 58 %

## 2017-04-11 ENCOUNTER — Telehealth: Payer: Self-pay | Admitting: Internal Medicine

## 2017-04-11 NOTE — Telephone Encounter (Signed)
New message  Pt verbalized that she is calling for RN  Last lab results

## 2017-04-14 NOTE — Telephone Encounter (Signed)
Ptcb on Friday 04/11/17 asking for her lab results. I tried to reach the pt today to go over her lab results. I had lmom on 03/20/17 calling to go over lab results. At that time I had also sent results to [facility].

## 2017-06-18 ENCOUNTER — Ambulatory Visit (INDEPENDENT_AMBULATORY_CARE_PROVIDER_SITE_OTHER): Payer: Medicare Other | Admitting: *Deleted

## 2017-06-18 DIAGNOSIS — R001 Bradycardia, unspecified: Secondary | ICD-10-CM

## 2017-06-18 NOTE — Progress Notes (Signed)
Remote pacemaker transmission.   

## 2017-06-23 ENCOUNTER — Encounter: Payer: Self-pay | Admitting: Cardiology

## 2017-06-29 LAB — CUP PACEART REMOTE DEVICE CHECK
Date Time Interrogation Session: 20190407204741
Implantable Lead Implant Date: 20141010
Implantable Lead Location: 753860
Implantable Lead Model: 350
MDC IDC LEAD IMPLANT DT: 20110523
MDC IDC LEAD LOCATION: 753859
MDC IDC LEAD SERIAL: 28756637
MDC IDC LEAD SERIAL: 29491855
MDC IDC PG IMPLANT DT: 20110523
MDC IDC PG SERIAL: 66080052

## 2017-09-17 ENCOUNTER — Ambulatory Visit (INDEPENDENT_AMBULATORY_CARE_PROVIDER_SITE_OTHER): Payer: Medicare Other | Admitting: *Deleted

## 2017-09-17 DIAGNOSIS — I255 Ischemic cardiomyopathy: Secondary | ICD-10-CM | POA: Diagnosis not present

## 2017-09-17 LAB — CUP PACEART REMOTE DEVICE CHECK
Brady Statistic AP VS Percent: 55 %
Brady Statistic AS VP Percent: 0 %
Date Time Interrogation Session: 20190626054419
Implantable Lead Implant Date: 20110523
Implantable Lead Location: 753859
Implantable Lead Location: 753860
Implantable Lead Model: 346
Implantable Lead Model: 350
Implantable Pulse Generator Implant Date: 20110523
Lead Channel Pacing Threshold Amplitude: 0.6 V
Lead Channel Pacing Threshold Pulse Width: 0.4 ms
Lead Channel Pacing Threshold Pulse Width: 0.4 ms
MDC IDC LEAD IMPLANT DT: 20141010
MDC IDC LEAD SERIAL: 28756637
MDC IDC LEAD SERIAL: 29491855
MDC IDC MSMT BATTERY REMAINING PERCENTAGE: 50 %
MDC IDC MSMT LEADCHNL RA IMPEDANCE VALUE: 650 Ohm
MDC IDC MSMT LEADCHNL RA PACING THRESHOLD AMPLITUDE: 0.6 V
MDC IDC MSMT LEADCHNL RV IMPEDANCE VALUE: 630 Ohm
MDC IDC SET LEADCHNL RV PACING PULSEWIDTH: 0.4 ms
MDC IDC STAT BRADY AP VP PERCENT: 0 %
MDC IDC STAT BRADY AS VS PERCENT: 45 %
MDC IDC STAT BRADY RA PERCENT PACED: 55 %
MDC IDC STAT BRADY RV PERCENT PACED: 0 %
Pulse Gen Serial Number: 66080052

## 2017-09-17 NOTE — Progress Notes (Signed)
Remote pacemaker transmission.   

## 2017-09-18 ENCOUNTER — Encounter: Payer: Self-pay | Admitting: Cardiology

## 2017-12-16 ENCOUNTER — Encounter: Payer: Self-pay | Admitting: Internal Medicine

## 2017-12-16 ENCOUNTER — Ambulatory Visit (INDEPENDENT_AMBULATORY_CARE_PROVIDER_SITE_OTHER): Payer: Medicare Other | Admitting: Internal Medicine

## 2017-12-16 VITALS — BP 140/80 | HR 73 | Ht 65.0 in | Wt 141.8 lb

## 2017-12-16 DIAGNOSIS — R001 Bradycardia, unspecified: Secondary | ICD-10-CM

## 2017-12-16 DIAGNOSIS — I255 Ischemic cardiomyopathy: Secondary | ICD-10-CM | POA: Diagnosis not present

## 2017-12-16 DIAGNOSIS — Z23 Encounter for immunization: Secondary | ICD-10-CM | POA: Diagnosis not present

## 2017-12-16 DIAGNOSIS — I480 Paroxysmal atrial fibrillation: Secondary | ICD-10-CM

## 2017-12-16 DIAGNOSIS — Z950 Presence of cardiac pacemaker: Secondary | ICD-10-CM | POA: Diagnosis not present

## 2017-12-16 LAB — CUP PACEART INCLINIC DEVICE CHECK
Date Time Interrogation Session: 20190924130900
Implantable Lead Implant Date: 20110523
Implantable Lead Location: 753859
Implantable Lead Model: 350
Implantable Lead Serial Number: 29491855
Lead Channel Impedance Value: 643 Ohm
Lead Channel Pacing Threshold Amplitude: 0.7 V
Lead Channel Pacing Threshold Amplitude: 0.7 V
Lead Channel Pacing Threshold Pulse Width: 0.4 ms
Lead Channel Pacing Threshold Pulse Width: 0.4 ms
Lead Channel Pacing Threshold Pulse Width: 0.4 ms
Lead Channel Pacing Threshold Pulse Width: 0.4 ms
Lead Channel Sensing Intrinsic Amplitude: 1.9 mV
Lead Channel Setting Pacing Amplitude: 1.7 V
MDC IDC LEAD IMPLANT DT: 20141010
MDC IDC LEAD LOCATION: 753860
MDC IDC LEAD SERIAL: 28756637
MDC IDC MSMT LEADCHNL RA PACING THRESHOLD AMPLITUDE: 0.7 V
MDC IDC MSMT LEADCHNL RA SENSING INTR AMPL: 1.9 mV
MDC IDC MSMT LEADCHNL RV IMPEDANCE VALUE: 682 Ohm
MDC IDC MSMT LEADCHNL RV PACING THRESHOLD AMPLITUDE: 0.7 V
MDC IDC MSMT LEADCHNL RV SENSING INTR AMPL: 15.7 mV
MDC IDC PG IMPLANT DT: 20110523
MDC IDC PG SERIAL: 66080052
MDC IDC SET LEADCHNL RV PACING AMPLITUDE: 1.6 V
MDC IDC SET LEADCHNL RV PACING PULSEWIDTH: 0.4 ms

## 2017-12-16 NOTE — Progress Notes (Signed)
HPI Meredith Gonzalez returns today for ongoing evaluation of hypertension, atrial fibrillation, sinus node dysfunction status post permanent pacemaker insertion. She has no anginal symptoms. She has had no chest pain, and has minimal palpitations. Her ejection fraction is approximately 40% by echo. Her dizziness has improved. She got a ticket when she wore her seat belt under her arm.  Allergies  Allergen Reactions  . Valium [Diazepam] Anxiety    Makes patient hyper Makes patient hyper  . Darifenacin Itching    Can take with Benadryl  . Darvon [Propoxyphene Hcl] Itching    Can take with Benadryl  . Daypro [Oxaprozin] Itching    Can take with Benadryl Can take with Benadryl  . Enablex [Darifenacin Hydrobromide Er] Itching    Can take with Benadryl  . Propoxyphene Itching    Can take with Benadryl  . Risperdal [Risperidone] Itching    Can take with Benadryl Can take with Benadryl  . Talwin [Pentazocine] Itching    Can take with Benadryl Can take with Benadryl  . Vicodin [Hydrocodone-Acetaminophen] Itching    Can take with Benadryl Can take with Benadryl  . Codeine Hives and Itching    Can take with Benadryl Can take with Benadryl  . Oxycodone Itching    Can take with Benadryl Can take with Benadryl Can take with Benadryl Can take with Benadryl     Current Outpatient Medications  Medication Sig Dispense Refill  . acetaminophen (TYLENOL) 500 MG tablet Take 500 mg by mouth as needed for moderate pain.     Marland Kitchen amitriptyline (ELAVIL) 25 MG tablet Take 25 mg by mouth at bedtime.    Marland Kitchen ammonium lactate (LAC-HYDRIN) 12 % lotion Apply 1 application topically 2 (two) times daily.  0  . aspirin EC 81 MG tablet Take 1 tablet (81 mg total) by mouth daily. 90 tablet 3  . Calcium Citrate-Vitamin D3 315-250 MG-UNIT TABS Take 1 tablet by mouth 2 (two) times daily.    . cephALEXin (KEFLEX) 500 MG capsule Take 1 capsule (500 mg total) by mouth 4 (four) times daily. 20 capsule 0  . clopidogrel  (PLAVIX) 75 MG tablet Take 1 tablet (75 mg total) by mouth daily with breakfast. 30 tablet 3  . diphenhydrAMINE (BENADRYL ALLERGY) 25 MG tablet Take 25 mg by mouth every 8 (eight) hours as needed.    . ferrous sulfate 325 (65 FE) MG tablet Take 1 tablet (325 mg total) by mouth 2 (two) times daily with a meal. 60 tablet 0  . flintstones complete (FLINTSTONES) 60 MG chewable tablet Chew 1 tablet by mouth daily.    Marland Kitchen FLUoxetine (PROZAC) 20 MG capsule Take 60 mg by mouth daily.   0  . furosemide (LASIX) 20 MG tablet Take 1 tablet (20 mg total) by mouth 3 (three) times a week. Monday, Wednesday & Friday 30 tablet 1  . gabapentin (NEURONTIN) 300 MG capsule Take 300-600 mg by mouth 2 (two) times daily. 300 mg in the mornning; 600 mg at night    . losartan (COZAAR) 25 MG tablet Take 1 tablet (25 mg total) by mouth daily. 30 tablet 0  . Melatonin 10 MG TABS Take 40 mg by mouth at bedtime.    . Multiple Vitamins-Minerals (MULTIVITAMIN ADULT) TABS Take 1 tablet by mouth daily.    . pantoprazole (PROTONIX) 40 MG tablet Take 1 tablet (40 mg total) by mouth daily. Can switch to any PPI 30 tablet 3  . pindolol (VISKEN) 5 MG tablet Take  1 tablet (5 mg total) by mouth 2 (two) times daily. 60 tablet 12  . pramipexole (MIRAPEX) 0.25 MG tablet Take 0.25 mg by mouth every morning.    . vitamin B-12 (CYANOCOBALAMIN) 1000 MCG tablet Take 1 tablet (1,000 mcg total) by mouth daily. 30 tablet 0  . atorvastatin (LIPITOR) 40 MG tablet Take 1 tablet (40 mg total) by mouth daily. 90 tablet 3  . fluticasone (FLONASE) 50 MCG/ACT nasal spray Place 1 spray into both nostrils as needed.  4  . nitroGLYCERIN (NITROSTAT) 0.4 MG SL tablet Place 1 tablet (0.4 mg total) under the tongue every 5 (five) minutes as needed for chest pain. 25 tablet 2  . potassium chloride SA (KLOR-CON M20) 20 MEQ tablet Take 1 tablet (20 mEq total) by mouth 3 (three) times a week. Monday, Wednesday & Friday 30 tablet 1   No current facility-administered  medications for this visit.      Past Medical History:  Diagnosis Date  . Anemia   . Arthritis   . Diabetes mellitus without complication ([place])   . Fibromyalgia   . Fracture of right humerus   . Generalized weakness   . History of echocardiogram    Echo 8/18: EF 50-55, no RWMA, Gr 1 DD, calcified AV leaflets, MAC, trivial MR, mod LAE, PASP 37  . Hypothyroidism   . Incontinence of urine   . Major depression, chronic   . Neuropathy   . Pacemaker   . Recurrent falls   . Stroke ([place]) 2000  . Syncope and collapse     ROS:   All systems reviewed and negative except as noted in the HPI.   Past Surgical History:  Procedure Laterality Date  . ABDOMINAL HYSTERECTOMY    . basal cell cancer removal    . CARPAL TUNNEL RELEASE     bilateral  . CHOLECYSTECTOMY    . CHOLECYSTECTOMY    . COLONOSCOPY    . CORONARY STENT INTERVENTION N/A 07/19/2016   Procedure: Coronary Stent Intervention;  Surgeon: Peter M Martinique, MD;  Location: [facility] LAB;  Service: Cardiovascular;  Laterality: N/A;  . EYE SURGERY     right eye catarace/lens implant  . FEMUR FRACTURE SURGERY     bilateral  . GASTRIC BYPASS    . LEAD REVISION N/A 01/01/2013   Procedure: LEAD REVISION;  Surgeon: Evans Lance, MD;  Location: [facility] CATH LAB;  Service: Cardiovascular;  Laterality: N/A;  . LEFT HEART CATH AND CORONARY ANGIOGRAPHY N/A 07/19/2016   Procedure: Left Heart Cath and Coronary Angiography;  Surgeon: Peter M Martinique, MD;  Location: [facility] LAB;  Service: Cardiovascular;  Laterality: N/A;  . PACEMAKER INSERTION    . right ankle surgery       Family History  Problem Relation Age of Onset  . Congestive Heart Failure Mother   . Heart attack Father   . Alcoholism Father   . Alcoholism Sister   . Alcoholism Brother   . Alcoholism Brother   . Throat cancer Brother      Social History   Socioeconomic History  . Marital status: Widowed    Spouse name: Not on file  . Number of children: Not on  file  . Years of education: Not on file  . Highest education level: Not on file  Occupational History  . Not on file  Social Needs  . Financial resource strain: Not on file  . Food insecurity:    Worry: Not on file  Inability: Not on file  . Transportation needs:    Medical: Not on file    Non-medical: Not on file  Tobacco Use  . Smoking status: Former Smoker    Packs/day: 0.50    Years: 4.00    Pack years: 2.00    Types: Cigarettes    Last attempt to quit: 08/07/1972    Years since quitting: 45.3  . Smokeless tobacco: Never Used  Substance and Sexual Activity  . Alcohol use: No    Frequency: Never  . Drug use: No  . Sexual activity: Not on file  Lifestyle  . Physical activity:    Days per week: Not on file    Minutes per session: Not on file  . Stress: Not on file  Relationships  . Social connections:    Talks on phone: Not on file    Gets together: Not on file    Attends religious service: Not on file    Active member of club or organization: Not on file    Attends meetings of clubs or organizations: Not on file    Relationship status: Not on file  . Intimate partner violence:    Fear of current or ex partner: Not on file    Emotionally abused: Not on file    Physically abused: Not on file    Forced sexual activity: Not on file  Other Topics Concern  . Not on file  Social History Narrative  . Not on file     BP 140/80   Pulse 73   Ht 5' 5"  (1.651 m)   Wt 141 lb 12.8 oz (64.3 kg)   SpO2 93%   BMI 23.60 kg/m   Physical Exam:  Well appearing NAD HEENT: Unremarkable Neck:  No JVD, no thyromegally Lymphatics:  No adenopathy Back:  No CVA tenderness Lungs:  Clear with no wheezes HEART:  Regular rate rhythm, no murmurs, no rubs, no clicks Abd:  soft, positive bowel sounds, no organomegally, no rebound, no guarding Ext:  2 plus pulses, no edema, no cyanosis, no clubbing Skin:  No rashes no nodules Neuro:  CN II through XII intact, motor grossly  intact  EKG - nsr with atrial pacing  DEVICE  Normal device function.  See PaceArt for details.   Assess/Plan: 1. Sinus node dysfunction - she is asymptomatic, s/p PPM insertion 2. PPM - her Biotronik DDD PM is working normally. I would anticipate placing the device sub-pectorally when she undergoes gen change. 3. Dyspnea - I have encouraged her to take her lasix daily. She denies sodium indiscretion. 4. HTN - her blood pressure is reasonably well controlled.   Mikle Bosworth.D.

## 2017-12-16 NOTE — Patient Instructions (Signed)
Medication Instructions:  Your physician recommends that you continue on your current medications as directed. Please refer to the Current Medication list given to you today.  Labwork: None ordered.  Testing/Procedures: None ordered.  Follow-Up: Your physician wants you to follow-up in: one year with Dr. Lovena Le.   You will receive a reminder letter in the mail two months in advance. If you don't receive a letter, please call our office to schedule the follow-up appointment.  Remote monitoring is used to monitor your Pacemaker from home. This monitoring reduces the number of office visits required to check your device to one time per year. It allows [place] to keep an eye on the functioning of your device to ensure it is working properly. You are scheduled for a device check from home on 12/17/2017. You may send your transmission at any time that day. If you have a wireless device, the transmission will be sent automatically. After your physician reviews your transmission, you will receive a postcard with your next transmission date.  Any Other Special Instructions Will Be Listed Below (If Applicable).  If you need a refill on your cardiac medications before your next appointment, please call your pharmacy.

## 2017-12-17 ENCOUNTER — Ambulatory Visit (INDEPENDENT_AMBULATORY_CARE_PROVIDER_SITE_OTHER): Payer: Medicare Other | Admitting: *Deleted

## 2017-12-17 DIAGNOSIS — R001 Bradycardia, unspecified: Secondary | ICD-10-CM

## 2017-12-17 NOTE — Progress Notes (Signed)
Remote pacemaker transmission.   

## 2017-12-18 ENCOUNTER — Encounter: Payer: Self-pay | Admitting: Cardiology

## 2017-12-23 DIAGNOSIS — S129XXA Fracture of neck, unspecified, initial encounter: Secondary | ICD-10-CM

## 2017-12-23 HISTORY — DX: Fracture of neck, unspecified, initial encounter: S12.9XXA

## 2017-12-28 ENCOUNTER — Emergency Department (HOSPITAL_BASED_OUTPATIENT_CLINIC_OR_DEPARTMENT_OTHER)
Admission: EM | Admit: 2017-12-28 | Discharge: 2017-12-28 | Disposition: A | Discharge status: Home / Self Care | Payer: Medicare Other | Attending: Emergency Medicine | Admitting: Emergency Medicine

## 2017-12-28 ENCOUNTER — Other Ambulatory Visit: Payer: Self-pay

## 2017-12-28 ENCOUNTER — Emergency Department (HOSPITAL_BASED_OUTPATIENT_CLINIC_OR_DEPARTMENT_OTHER): Payer: Medicare Other

## 2017-12-28 DIAGNOSIS — S199XXA Unspecified injury of neck, initial encounter: Secondary | ICD-10-CM | POA: Diagnosis present

## 2017-12-28 DIAGNOSIS — S0083XA Contusion of other part of head, initial encounter: Secondary | ICD-10-CM | POA: Diagnosis not present

## 2017-12-28 DIAGNOSIS — R51 Headache: Secondary | ICD-10-CM | POA: Insufficient documentation

## 2017-12-28 DIAGNOSIS — Z7902 Long term (current) use of antithrombotics/antiplatelets: Secondary | ICD-10-CM | POA: Insufficient documentation

## 2017-12-28 DIAGNOSIS — I5042 Chronic combined systolic (congestive) and diastolic (congestive) heart failure: Secondary | ICD-10-CM | POA: Diagnosis not present

## 2017-12-28 DIAGNOSIS — Z79899 Other long term (current) drug therapy: Secondary | ICD-10-CM | POA: Diagnosis not present

## 2017-12-28 DIAGNOSIS — Z950 Presence of cardiac pacemaker: Secondary | ICD-10-CM | POA: Diagnosis not present

## 2017-12-28 DIAGNOSIS — Y93E8 Activity, other personal hygiene: Secondary | ICD-10-CM | POA: Diagnosis not present

## 2017-12-28 DIAGNOSIS — S12100A Unspecified displaced fracture of second cervical vertebra, initial encounter for closed fracture: Secondary | ICD-10-CM | POA: Diagnosis not present

## 2017-12-28 DIAGNOSIS — I110 Hypertensive heart disease with heart failure: Secondary | ICD-10-CM | POA: Diagnosis not present

## 2017-12-28 DIAGNOSIS — Z87891 Personal history of nicotine dependence: Secondary | ICD-10-CM | POA: Insufficient documentation

## 2017-12-28 DIAGNOSIS — Y92019 Unspecified place in single-family (private) house as the place of occurrence of the external cause: Secondary | ICD-10-CM | POA: Diagnosis not present

## 2017-12-28 DIAGNOSIS — Z7982 Long term (current) use of aspirin: Secondary | ICD-10-CM | POA: Insufficient documentation

## 2017-12-28 DIAGNOSIS — E1140 Type 2 diabetes mellitus with diabetic neuropathy, unspecified: Secondary | ICD-10-CM | POA: Insufficient documentation

## 2017-12-28 DIAGNOSIS — I252 Old myocardial infarction: Secondary | ICD-10-CM | POA: Diagnosis not present

## 2017-12-28 DIAGNOSIS — Y998 Other external cause status: Secondary | ICD-10-CM | POA: Insufficient documentation

## 2017-12-28 DIAGNOSIS — S1201XA Stable burst fracture of first cervical vertebra, initial encounter for closed fracture: Secondary | ICD-10-CM | POA: Insufficient documentation

## 2017-12-28 DIAGNOSIS — I2510 Atherosclerotic heart disease of native coronary artery without angina pectoris: Secondary | ICD-10-CM | POA: Insufficient documentation

## 2017-12-28 DIAGNOSIS — E039 Hypothyroidism, unspecified: Secondary | ICD-10-CM | POA: Insufficient documentation

## 2017-12-28 DIAGNOSIS — Z8673 Personal history of transient ischemic attack (TIA), and cerebral infarction without residual deficits: Secondary | ICD-10-CM | POA: Insufficient documentation

## 2017-12-28 DIAGNOSIS — W010XXA Fall on same level from slipping, tripping and stumbling without subsequent striking against object, initial encounter: Secondary | ICD-10-CM | POA: Insufficient documentation

## 2017-12-28 MED ORDER — HYDROCODONE-ACETAMINOPHEN 5-325 MG PO TABS: 1.0000 | ORAL_TABLET | Freq: Four times a day (QID) | ORAL | 0 refills | Status: DC | PRN

## 2017-12-28 MED ORDER — DIPHENHYDRAMINE HCL 12.5 MG/5ML PO ELIX
12.5000 mg | ORAL_SOLUTION | Freq: Once | ORAL | Status: AC
  Administered 2017-12-28: 12.5 mg via ORAL

## 2017-12-28 MED ORDER — DIPHENHYDRAMINE HCL 12.5 MG/5ML PO ELIX
ORAL_SOLUTION | ORAL | Status: AC
  Filled 2017-12-28: qty 10

## 2017-12-28 MED ORDER — HYDROCODONE-ACETAMINOPHEN 5-325 MG PO TABS
1.0000 | ORAL_TABLET | Freq: Once | ORAL | Status: AC
  Administered 2017-12-28: 1 via ORAL
  Filled 2017-12-28: qty 1

## 2017-12-28 NOTE — Discharge Instructions (Signed)
You were seen today and found to have 2 broken vertebra in your neck.  You need to keep the c-collar on at all times except when you shower.  Follow-up with neurosurgery this week.  If you develop weakness or numbness in your hands or legs, you should be reevaluated immediately.  You will be given a short course of pain medication.  This can put you at increased risk for falls.  You should not drive during this time while wearing a c-collar or taking pain medication.

## 2017-12-28 NOTE — ED Notes (Signed)
Pt returned from CT °

## 2017-12-28 NOTE — ED Provider Notes (Signed)
[facility] EMERGENCY DEPARTMENT Provider Note   CSN: [id number] Arrival date & time: 12/28/17  0500     History   Chief Complaint Chief Complaint  Patient presents with  . Fall    HPI Meredith Gonzalez is a 82 y.o. female.  HPI  This is an 82 year old female who presents with headache and neck pain.  She reports that she fell approximately 1 week ago on September 28.  She states that she tripped while trying to put on her pants leg.  She hit her head but did not lose consciousness.  She is on Plavix.  Patient reports that she has had persistent headache since that time.  She has taken Tylenol with minimal relief.  Currently her pain is 8 out of 10.  She states that her pain worsened overnight.  Pain is worse with movement of her neck and eyes.  Patient reports that at baseline she walks with a walker.  She lives independently.  She denies any hip, back, knee pain.  She has been ambulatory since the fall.  Denies chest pain, shortness of breath, syncope.  Denies nausea or vomiting.  Past Medical History:  Diagnosis Date  . Anemia   . Arthritis   . Diabetes mellitus without complication ([place])   . Fibromyalgia   . Fracture of right humerus   . Generalized weakness   . History of echocardiogram    Echo 8/18: EF 50-55, no RWMA, Gr 1 DD, calcified AV leaflets, MAC, trivial MR, mod LAE, PASP 37  . Hypothyroidism   . Incontinence of urine   . Major depression, chronic   . Neuropathy   . Pacemaker   . Recurrent falls   . Stroke ([place]) 2000  . Syncope and collapse     Patient Active Problem List   Diagnosis Date Noted  . CAD (coronary artery disease) 08/07/2016  . Chronic combined systolic and diastolic CHF (congestive heart failure) ([place]) 08/07/2016  . Ischemic cardiomyopathy 08/07/2016  . Chest pain   . NSTEMI (non-ST elevated myocardial infarction) ([place])   . Status post coronary artery stent placement   . Abnormal stress test   . SOB (shortness of breath)   .  Multifocal pneumonia 07/13/2016  . Elevated troponin 07/13/2016  . Leukocytosis   . Pacemaker 05/17/2014  . Complications, pacemaker cardiac, mechanical 12/31/2012  . Edema extremities 10/08/2012  . PAF (paroxysmal atrial fibrillation) ([place]) 10/08/2012  . Hypertension 10/08/2012  . Neuropathy 10/08/2012  . Diabetes ([place]) 10/08/2012  . Depression 09/08/2012  . Anemia 08/08/2012  . Knee fracture, left 08/07/2012  . Recurrent falls 08/07/2012  . Syncope 08/07/2012  . SDH (subdural hematoma) ([place]) 08/07/2012    Past Surgical History:  Procedure Laterality Date  . ABDOMINAL HYSTERECTOMY    . basal cell cancer removal    . CARPAL TUNNEL RELEASE     bilateral  . CHOLECYSTECTOMY    . CHOLECYSTECTOMY    . COLONOSCOPY    . CORONARY STENT INTERVENTION N/A 07/19/2016   Procedure: Coronary Stent Intervention;  Surgeon: Peter M Martinique, MD;  Location: [facility];  Service: Cardiovascular;  Laterality: N/A;  . EYE SURGERY     right eye catarace/lens implant  . FEMUR FRACTURE SURGERY     bilateral  . GASTRIC BYPASS    . LEAD REVISION N/A 01/01/2013   Procedure: LEAD REVISION;  Surgeon: Evans Lance, MD;  Location[facility] C[facility] LAB;  Service: Cardiovascular;  Laterality: N/A;  . LEFT HEART CATH AND CORONARY  ANGIOGRAPHY N/A 07/19/2016   Procedure: Left Heart Cath and Coronary Angiography;  Surgeon: Peter M Martinique, MD;  Location: [facility];  Service: Cardiovascular;  Laterality: N/A;  . PACEMAKER INSERTION    . right ankle surgery       OB History   None      Home Medications    Prior to Admission medications   Medication Sig Start Date End Date Taking? Authorizing Provider  acetaminophen (TYLENOL) 500 MG tablet Take 500 mg by mouth as needed for moderate pain.     [provider]  amitriptyline (ELAVIL) 25 MG tablet Take 25 mg by mouth at bedtime.    [provider]  ammonium lactate (LAC-HYDRIN) 12 % lotion Apply 1 application topically 2 (two) times  daily. 07/05/16   [provider]  aspirin EC 81 MG tablet Take 1 tablet (81 mg total) by mouth daily. 03/29/13   Fay Records, MD  atorvastatin (LIPITOR) 40 MG tablet Take 1 tablet (40 mg total) by mouth daily. 08/07/16 11/11/16  Richardson Dopp T, PA-C  Calcium Citrate-Vitamin D3 315-250 MG-UNIT TABS Take 1 tablet by mouth 2 (two) times daily.    [provider]  cephALEXin (KEFLEX) 500 MG capsule Take 1 capsule (500 mg total) by mouth 4 (four) times daily. 02/14/17   Doristine Devoid, PA-C  clopidogrel (PLAVIX) 75 MG tablet Take 1 tablet (75 mg total) by mouth daily with breakfast. 07/21/16   Thurnell Lose, MD  diphenhydrAMINE (BENADRYL ALLERGY) 25 MG tablet Take 25 mg by mouth every 8 (eight) hours as needed.    [provider]  ferrous sulfate 325 (65 FE) MG tablet Take 1 tablet (325 mg total) by mouth 2 (two) times daily with a meal. 07/20/16   Thurnell Lose, MD  flintstones complete (FLINTSTONES) 60 MG chewable tablet Chew 1 tablet by mouth daily.    [provider]  FLUoxetine (PROZAC) 20 MG capsule Take 60 mg by mouth daily.  09/12/15   [provider]  fluticasone (FLONASE) 50 MCG/ACT nasal spray Place 1 spray into both nostrils as needed. 12/08/17   [provider]  furosemide (LASIX) 20 MG tablet Take 1 tablet (20 mg total) by mouth 3 (three) times a week. Monday, Wednesday & Friday 08/09/16   Richardson Dopp T, PA-C  gabapentin (NEURONTIN) 300 MG capsule Take 300-600 mg by mouth 2 (two) times daily. 300 mg in the mornning; 600 mg at night    [provider]  HYDROcodone-acetaminophen (NORCO/VICODIN) 5-325 MG tablet Take 1 tablet by mouth every 6 (six) hours as needed. 12/28/17   Marie Borowski, Barbette Hair, MD  losartan (COZAAR) 25 MG tablet Take 1 tablet (25 mg total) by mouth daily. 07/20/16   Thurnell Lose, MD  Melatonin 10 MG TABS Take 40 mg by mouth at bedtime.    [provider]  Multiple Vitamins-Minerals (MULTIVITAMIN  ADULT) TABS Take 1 tablet by mouth daily.    [provider]  nitroGLYCERIN (NITROSTAT) 0.4 MG SL tablet Place 1 tablet (0.4 mg total) under the tongue every 5 (five) minutes as needed for chest pain. 10/11/16 01/09/17  Fay Records, MD  pantoprazole (PROTONIX) 40 MG tablet Take 1 tablet (40 mg total) by mouth daily. Can switch to any PPI 07/20/16   Thurnell Lose, MD  pindolol (VISKEN) 5 MG tablet Take 1 tablet (5 mg total) by mouth 2 (two) times daily. 03/01/13   Fay Records, MD  potassium chloride  SA (KLOR-CON M20) 20 MEQ tablet Take 1 tablet (20 mEq total) by mouth 3 (three) times a week. Monday, Wednesday & Friday 08/09/16 11/07/16  Richardson Dopp T, PA-C  pramipexole (MIRAPEX) 0.25 MG tablet Take 0.25 mg by mouth every morning. 05/17/16   [provider]  vitamin B-12 (CYANOCOBALAMIN) 1000 MCG tablet Take 1 tablet (1,000 mcg total) by mouth daily. 07/20/16   Thurnell Lose, MD    Family History Family History  Problem Relation Age of Onset  . Congestive Heart Failure Mother   . Heart attack Father   . Alcoholism Father   . Alcoholism Sister   . Alcoholism Brother   . Alcoholism Brother   . Throat cancer Brother     Social History Social History   Tobacco Use  . Smoking status: Former Smoker    Packs/day: 0.50    Years: 4.00    Pack years: 2.00    Types: Cigarettes    Last attempt to quit: 08/07/1972    Years since quitting: 45.4  . Smokeless tobacco: Never Used  Substance Use Topics  . Alcohol use: No    Frequency: Never  . Drug use: No     Allergies   Valium [diazepam]; Darifenacin; Darvon [propoxyphene hcl]; Daypro [oxaprozin]; Enablex [darifenacin hydrobromide er]; Propoxyphene; Risperdal [risperidone]; Talwin [pentazocine]; Vicodin [hydrocodone-acetaminophen]; Codeine; and Oxycodone   Review of Systems Review of Systems  Respiratory: Negative for shortness of breath.   Cardiovascular: Negative for chest pain.  Gastrointestinal: Negative for  nausea and vomiting.  Musculoskeletal: Positive for neck pain. Negative for back pain.  Skin: Positive for wound.  Neurological: Positive for headaches. Negative for dizziness and syncope.  All other systems reviewed and are negative.    Physical Exam Updated Vital Signs Ht 1.651 m (5' 5" )   Wt 63.9 kg   SpO2 100%   BMI 23.43 kg/m   Physical Exam  Constitutional: She is oriented to person, place, and time. She appears well-developed and well-nourished.  ABCs intact  HENT:  Head: Normocephalic and atraumatic.  Abrasion/contusion to left forehead  Eyes: Pupils are equal, round, and reactive to light. EOM are normal.  Neck: Normal range of motion. Neck supple.  Tenderness to palpation mid cervical spine, no step-off or deformity  Cardiovascular: Normal rate, regular rhythm and normal heart sounds.  Pulmonary/Chest: Effort normal and breath sounds normal. No respiratory distress. She has no wheezes.  Abdominal: Soft. Bowel sounds are normal.  Neurological: She is alert and oriented to person, place, and time.  Fluent speech, cranial nerves II through XII intact, 5 out of 5 strength in all 4 extremities  Skin: Skin is warm and dry.  Psychiatric: She has a normal mood and affect.  Nursing note and vitals reviewed.    ED Treatments / Results  Labs (all labs ordered are listed, but only abnormal results are displayed) Labs Reviewed - No data to display  EKG None  Radiology Ct Head Wo Contrast  Addendum Date: 12/28/2017   ADDENDUM REPORT: 12/28/2017 06:18 ADDENDUM: Critical Value/emergent results were called by telephone at the time of interpretation on 12/28/2017 at 0615 hours to Dr. Thayer Jew , who verbally acknowledged these results. Electronically Signed   By: Genevie Ann M.D.   On: 12/28/2017 06:18   Result Date: 12/28/2017 CLINICAL DATA:  82 year old female status post fall 1 week ago. Abrasion and soft tissue injury left anterior skull. Daily headache. Pain radiating  from the shoulder through the neck and into the head. Painful cervical  spine range of motion. EXAM: CT HEAD WITHOUT CONTRAST CT CERVICAL SPINE WITHOUT CONTRAST TECHNIQUE: Multidetector CT imaging of the head and cervical spine was performed following the standard protocol without intravenous contrast. Multiplanar CT image reconstructions of the cervical spine were also generated. COMPARISON:  Head CT without contrast 08/01/2015. Head and cervical spine CT 04/30/2015. FINDINGS: CT HEAD FINDINGS Brain: Cerebral volume remains normal for age. Patchy bilateral white matter hypodensity appears stable. No midline shift, ventriculomegaly, mass effect, evidence of mass lesion, intracranial hemorrhage or evidence of cortically based acute infarction. No cortical encephalomalacia identified. Vascular: Calcified atherosclerosis at the skull base. No suspicious intracranial vascular hyperdensity. Skull: Stable and intact, see cervical spine findings below. Sinuses/Orbits: Visualized paranasal sinuses and mastoids are well pneumatized. Other: Mild left forehead scalp contusion on series 3, image 45. No other scalp soft tissue injury. Calcified scalp vasculature atherosclerosis. Negative orbits soft tissues (postoperative changes). CT CERVICAL SPINE FINDINGS Alignment: Despite fractures at both levels, see below. Visualized skull base is intact. No atlanto-occipital dissociation. There is chronic anterolisthesis of C3 on C4 and C4 on C5 which is stable. Previous lower cervical spine fusion with similar chronic anterolisthesis of C7 on T1. Skull base and vertebrae: Comminuted right anterior C1 ring fracture plus more linear bilateral posterolateral fractures (series 6, image 20). These fractures are generally minimally displaced. An anterior C1 butterfly fragment mildly displaced as seen on series 11, image 36. there is only mild left lateral subluxation of the C1 lateral mass on the C2 articular pillar (series 9, image 31).  Superimposed comminuted type 2 odontoid fracture (series 9, image 30 and series 10, image 31). Minimal anterior displacement. The C2 body, pedicles and posterior elements are intact. No other cervical spine fracture identified. Chronic C5-C6 and C6-C7 arthrodesis/ankylosis. Soft tissues and spinal canal: No prevertebral fluid or swelling. No visible canal hematoma. Bulky calcified carotid atherosclerosis, otherwise negative noncontrast neck soft tissues. Disc levels: Chronic solid interbody and posterior element ankylosis at C5 through C7. Chronic moderate to severe bilateral facet arthropathy associated with the C3-C4, C4-C5, and C7-T1 spondylolisthesis. Upper chest: Stable mild chronic T1 superior endplate compression. The other visible upper thoracic levels appear intact. Partially visible left chest cardiac pacemaker device. Chronic apical lung scarring. IMPRESSION: 1. Positive for comminuted Jefferson fracture of the C1 ring. Superimposed type 2 Odontoid fracture of C2. Only minimal to mild fracture displacement at this time, including slight left lateral listhesis of the left C1 fragment on C2. 2. No other cervical spine fracture. Chronic C5 through C7 arthrodesis. 3. No acute traumatic injury to the brain and stable non contrast CT appearance of the brain since 2017. 4. Mild scalp soft tissue injury without underlying skull fracture. Electronically Signed: By: Genevie Ann M.D. On: 12/28/2017 06:08   Ct Cervical Spine Wo Contrast  Addendum Date: 12/28/2017   ADDENDUM REPORT: 12/28/2017 06:18 ADDENDUM: Critical Value/emergent results were called by telephone at the time of interpretation on 12/28/2017 at 0615 hours to Dr. Thayer Jew , who verbally acknowledged these results. Electronically Signed   By: Genevie Ann M.D.   On: 12/28/2017 06:18   Result Date: 12/28/2017 CLINICAL DATA:  82 year old female status post fall 1 week ago. Abrasion and soft tissue injury left anterior skull. Daily headache. Pain  radiating from the shoulder through the neck and into the head. Painful cervical spine range of motion. EXAM: CT HEAD WITHOUT CONTRAST CT CERVICAL SPINE WITHOUT CONTRAST TECHNIQUE: Multidetector CT imaging of the head and cervical spine was performed following the  standard protocol without intravenous contrast. Multiplanar CT image reconstructions of the cervical spine were also generated. COMPARISON:  Head CT without contrast 08/01/2015. Head and cervical spine CT 04/30/2015. FINDINGS: CT HEAD FINDINGS Brain: Cerebral volume remains normal for age. Patchy bilateral white matter hypodensity appears stable. No midline shift, ventriculomegaly, mass effect, evidence of mass lesion, intracranial hemorrhage or evidence of cortically based acute infarction. No cortical encephalomalacia identified. Vascular: Calcified atherosclerosis at the skull base. No suspicious intracranial vascular hyperdensity. Skull: Stable and intact, see cervical spine findings below. Sinuses/Orbits: Visualized paranasal sinuses and mastoids are well pneumatized. Other: Mild left forehead scalp contusion on series 3, image 45. No other scalp soft tissue injury. Calcified scalp vasculature atherosclerosis. Negative orbits soft tissues (postoperative changes). CT CERVICAL SPINE FINDINGS Alignment: Despite fractures at both levels, see below. Visualized skull base is intact. No atlanto-occipital dissociation. There is chronic anterolisthesis of C3 on C4 and C4 on C5 which is stable. Previous lower cervical spine fusion with similar chronic anterolisthesis of C7 on T1. Skull base and vertebrae: Comminuted right anterior C1 ring fracture plus more linear bilateral posterolateral fractures (series 6, image 20). These fractures are generally minimally displaced. An anterior C1 butterfly fragment mildly displaced as seen on series 11, image 36. there is only mild left lateral subluxation of the C1 lateral mass on the C2 articular pillar (series 9, image  31). Superimposed comminuted type 2 odontoid fracture (series 9, image 30 and series 10, image 31). Minimal anterior displacement. The C2 body, pedicles and posterior elements are intact. No other cervical spine fracture identified. Chronic C5-C6 and C6-C7 arthrodesis/ankylosis. Soft tissues and spinal canal: No prevertebral fluid or swelling. No visible canal hematoma. Bulky calcified carotid atherosclerosis, otherwise negative noncontrast neck soft tissues. Disc levels: Chronic solid interbody and posterior element ankylosis at C5 through C7. Chronic moderate to severe bilateral facet arthropathy associated with the C3-C4, C4-C5, and C7-T1 spondylolisthesis. Upper chest: Stable mild chronic T1 superior endplate compression. The other visible upper thoracic levels appear intact. Partially visible left chest cardiac pacemaker device. Chronic apical lung scarring. IMPRESSION: 1. Positive for comminuted Jefferson fracture of the C1 ring. Superimposed type 2 Odontoid fracture of C2. Only minimal to mild fracture displacement at this time, including slight left lateral listhesis of the left C1 fragment on C2. 2. No other cervical spine fracture. Chronic C5 through C7 arthrodesis. 3. No acute traumatic injury to the brain and stable non contrast CT appearance of the brain since 2017. 4. Mild scalp soft tissue injury without underlying skull fracture. Electronically Signed: By: Genevie Ann M.D. On: 12/28/2017 06:08    Procedures Procedures (including critical care time)  Medications Ordered in ED Medications  diphenhydrAMINE (BENADRYL) 12.5 MG/5ML elixir (has no administration in time range)  HYDROcodone-acetaminophen (NORCO/VICODIN) 5-325 MG per tablet 1 tablet (1 tablet Oral Given 12/28/17 0636)  diphenhydrAMINE (BENADRYL) 12.5 MG/5ML elixir 12.5 mg (12.5 mg Oral Given 12/28/17 7517)     Initial Impression / Assessment and Plan / ED Course  I have reviewed the triage vital signs and the nursing  notes.  Pertinent labs & imaging results that were available during my care of the patient were reviewed by me and considered in my medical decision making (see chart for details).     Presents with persistent headache and neck pain following a fall.  She is on Plavix.  She has a contusion to her forehead and pain with palpation of the neck.  She has limited range of motion of the neck.  CT head neck obtained.  Patient was given a dose of Norco.  CT scan shows a Jefferson fracture as well as an odontoid fracture.  She is neurologically intact.  Discussed with neurosurgery, Dr. Trenton Gammon.  Patient was placed in an [place] collar.  Will treat pain.  I discussed with patient that she should not drive while wearing a collar or taking pain medications.  She also was advised that she can be an increased fall risk.  She stated understanding.  After history, exam, and medical workup I feel the patient has been appropriately medically screened and is safe for discharge home. Pertinent diagnoses were discussed with the patient. Patient was given return precautions.   Final Clinical Impressions(s) / ED Diagnoses   Final diagnoses:  Closed Jefferson fracture, initial encounter [facility])  Closed odontoid fracture, initial encounter ([place])  Contusion of face, initial encounter    ED Discharge Orders         Ordered    HYDROcodone-acetaminophen (NORCO/VICODIN) 5-325 MG tablet  Every 6 hours PRN     12/28/17 0646           Merryl Hacker, MD 12/28/17 [phone]

## 2017-12-28 NOTE — ED Triage Notes (Signed)
Pt c/o fall 12/20/17 after tripping on her pant leg at home. Denies LOC. Pt did contact her PCP 12/24/17 who recommended she be seen at [facility] or ED. Pt states she continues to have HA and neck pain, and difficulty opening her mouth all the way. States she has been trying heat, and Tylenol for pain relief at home. Pt also states it hurts to look down, up or turn her head. She is on Plavix.

## 2018-01-16 LAB — CUP PACEART REMOTE DEVICE CHECK
Implantable Lead Implant Date: 20141010
Implantable Lead Location: 753860
Implantable Lead Model: 346
Implantable Lead Model: 350
Implantable Lead Serial Number: 28756637
Implantable Pulse Generator Implant Date: 20110523
MDC IDC LEAD IMPLANT DT: 20110523
MDC IDC LEAD LOCATION: 753859
MDC IDC LEAD SERIAL: 29491855
MDC IDC PG SERIAL: 66080052
MDC IDC SESS DTM: 20191025112618

## 2018-02-03 ENCOUNTER — Telehealth: Payer: Self-pay | Admitting: Cardiology

## 2018-02-03 NOTE — Telephone Encounter (Signed)
LMOVM for pt to return call in regards to disconnected monitor.

## 2018-02-03 NOTE — Telephone Encounter (Signed)
-----   Message from Shiela Mayer, RN sent at 01/23/2018  4:50 PM EDT ----- Regarding: Disconnected monitor Hey!!  This patient's monitor is disconnected in [place]. Can someone call for troubleshooting?  Thanks, Tobin Chad

## 2018-02-05 NOTE — Telephone Encounter (Signed)
2nd attempt  LMOVM for pt to return call.  

## 2018-02-10 ENCOUNTER — Inpatient Hospital Stay (HOSPITAL_COMMUNITY)
Admission: AD | Admit: 2018-02-10 | Discharge: 2018-02-14 | DRG: 292 | Disposition: A | Discharge status: Home Health | Payer: Medicare Other | Source: Ambulatory Visit | Attending: Cardiology | Admitting: Cardiology

## 2018-02-10 ENCOUNTER — Ambulatory Visit (HOSPITAL_COMMUNITY): Payer: Medicare Other | Admitting: Cardiology

## 2018-02-10 ENCOUNTER — Encounter: Payer: Self-pay | Admitting: Cardiology

## 2018-02-10 ENCOUNTER — Other Ambulatory Visit: Payer: Self-pay

## 2018-02-10 VITALS — BP 136/70 | HR 87 | Ht 65.0 in | Wt 153.8 lb

## 2018-02-10 DIAGNOSIS — Z9049 Acquired absence of other specified parts of digestive tract: Secondary | ICD-10-CM | POA: Diagnosis not present

## 2018-02-10 DIAGNOSIS — I495 Sick sinus syndrome: Secondary | ICD-10-CM | POA: Diagnosis present

## 2018-02-10 DIAGNOSIS — Z7902 Long term (current) use of antithrombotics/antiplatelets: Secondary | ICD-10-CM | POA: Diagnosis not present

## 2018-02-10 DIAGNOSIS — I2510 Atherosclerotic heart disease of native coronary artery without angina pectoris: Secondary | ICD-10-CM | POA: Diagnosis present

## 2018-02-10 DIAGNOSIS — Z9884 Bariatric surgery status: Secondary | ICD-10-CM | POA: Diagnosis not present

## 2018-02-10 DIAGNOSIS — I4891 Unspecified atrial fibrillation: Secondary | ICD-10-CM | POA: Diagnosis not present

## 2018-02-10 DIAGNOSIS — Z8673 Personal history of transient ischemic attack (TIA), and cerebral infarction without residual deficits: Secondary | ICD-10-CM | POA: Diagnosis not present

## 2018-02-10 DIAGNOSIS — F329 Major depressive disorder, single episode, unspecified: Secondary | ICD-10-CM | POA: Diagnosis present

## 2018-02-10 DIAGNOSIS — I10 Essential (primary) hypertension: Secondary | ICD-10-CM

## 2018-02-10 DIAGNOSIS — E039 Hypothyroidism, unspecified: Secondary | ICD-10-CM | POA: Diagnosis present

## 2018-02-10 DIAGNOSIS — I361 Nonrheumatic tricuspid (valve) insufficiency: Secondary | ICD-10-CM | POA: Diagnosis present

## 2018-02-10 DIAGNOSIS — Z950 Presence of cardiac pacemaker: Secondary | ICD-10-CM

## 2018-02-10 DIAGNOSIS — I5023 Acute on chronic systolic (congestive) heart failure: Secondary | ICD-10-CM | POA: Diagnosis not present

## 2018-02-10 DIAGNOSIS — E8809 Other disorders of plasma-protein metabolism, not elsewhere classified: Secondary | ICD-10-CM | POA: Diagnosis present

## 2018-02-10 DIAGNOSIS — I255 Ischemic cardiomyopathy: Secondary | ICD-10-CM | POA: Diagnosis not present

## 2018-02-10 DIAGNOSIS — E1140 Type 2 diabetes mellitus with diabetic neuropathy, unspecified: Secondary | ICD-10-CM | POA: Diagnosis present

## 2018-02-10 DIAGNOSIS — Z955 Presence of coronary angioplasty implant and graft: Secondary | ICD-10-CM

## 2018-02-10 DIAGNOSIS — N179 Acute kidney failure, unspecified: Secondary | ICD-10-CM | POA: Diagnosis present

## 2018-02-10 DIAGNOSIS — D509 Iron deficiency anemia, unspecified: Secondary | ICD-10-CM | POA: Diagnosis present

## 2018-02-10 DIAGNOSIS — Z79890 Hormone replacement therapy (postmenopausal): Secondary | ICD-10-CM

## 2018-02-10 DIAGNOSIS — I110 Hypertensive heart disease with heart failure: Secondary | ICD-10-CM | POA: Diagnosis present

## 2018-02-10 DIAGNOSIS — I5033 Acute on chronic diastolic (congestive) heart failure: Secondary | ICD-10-CM | POA: Diagnosis present

## 2018-02-10 DIAGNOSIS — Z885 Allergy status to narcotic agent status: Secondary | ICD-10-CM

## 2018-02-10 DIAGNOSIS — Z8249 Family history of ischemic heart disease and other diseases of the circulatory system: Secondary | ICD-10-CM

## 2018-02-10 DIAGNOSIS — I480 Paroxysmal atrial fibrillation: Secondary | ICD-10-CM | POA: Diagnosis present

## 2018-02-10 DIAGNOSIS — Z90710 Acquired absence of both cervix and uterus: Secondary | ICD-10-CM

## 2018-02-10 DIAGNOSIS — Z7982 Long term (current) use of aspirin: Secondary | ICD-10-CM | POA: Diagnosis not present

## 2018-02-10 DIAGNOSIS — R296 Repeated falls: Secondary | ICD-10-CM | POA: Diagnosis present

## 2018-02-10 DIAGNOSIS — Z85828 Personal history of other malignant neoplasm of skin: Secondary | ICD-10-CM | POA: Diagnosis not present

## 2018-02-10 DIAGNOSIS — Z79899 Other long term (current) drug therapy: Secondary | ICD-10-CM

## 2018-02-10 DIAGNOSIS — I509 Heart failure, unspecified: Secondary | ICD-10-CM

## 2018-02-10 DIAGNOSIS — Z87891 Personal history of nicotine dependence: Secondary | ICD-10-CM

## 2018-02-10 DIAGNOSIS — I340 Nonrheumatic mitral (valve) insufficiency: Secondary | ICD-10-CM | POA: Diagnosis not present

## 2018-02-10 DIAGNOSIS — Z888 Allergy status to other drugs, medicaments and biological substances status: Secondary | ICD-10-CM

## 2018-02-10 DIAGNOSIS — Z7951 Long term (current) use of inhaled steroids: Secondary | ICD-10-CM

## 2018-02-10 HISTORY — DX: Anxiety disorder, unspecified: F41.9

## 2018-02-10 HISTORY — DX: Basal cell carcinoma of skin, unspecified: C44.91

## 2018-02-10 HISTORY — DX: Acute myocardial infarction, unspecified: I21.9

## 2018-02-10 HISTORY — DX: Personal history of other medical treatment: Z92.89

## 2018-02-10 HISTORY — DX: Cerebral infarction, unspecified: I63.9

## 2018-02-10 HISTORY — DX: Headache: R51

## 2018-02-10 HISTORY — DX: Headache, unspecified: R51.9

## 2018-02-10 HISTORY — DX: Heart failure, unspecified: I50.9

## 2018-02-10 HISTORY — DX: Essential (primary) hypertension: I10

## 2018-02-10 HISTORY — DX: Other chronic pain: G89.29

## 2018-02-10 HISTORY — DX: Fracture of neck, unspecified, initial encounter: S12.9XXA

## 2018-02-10 HISTORY — DX: Type 2 diabetes mellitus with diabetic polyneuropathy: E11.42

## 2018-02-10 HISTORY — DX: Cervicalgia: M54.2

## 2018-02-10 HISTORY — DX: Unspecified chronic bronchitis: J42

## 2018-02-10 HISTORY — DX: Type 2 diabetes mellitus without complications: E11.9

## 2018-02-10 LAB — CBC
HEMATOCRIT: 29.3 % — AB (ref 36.0–46.0)
HEMOGLOBIN: 8.8 g/dL — AB (ref 12.0–15.0)
MCH: 28.8 pg (ref 26.0–34.0)
MCHC: 30 g/dL (ref 30.0–36.0)
MCV: 95.8 fL (ref 80.0–100.0)
Platelets: 211 10*3/uL (ref 150–400)
RBC: 3.06 MIL/uL — ABNORMAL LOW (ref 3.87–5.11)
RDW: 16.6 % — ABNORMAL HIGH (ref 11.5–15.5)
WBC: 6.1 10*3/uL (ref 4.0–10.5)
nRBC: 0 % (ref 0.0–0.2)

## 2018-02-10 LAB — BASIC METABOLIC PANEL
Anion gap: 7 (ref 5–15)
BUN: 20 mg/dL (ref 8–23)
CHLORIDE: 109 mmol/L (ref 98–111)
CO2: 22 mmol/L (ref 22–32)
Calcium: 8.6 mg/dL — ABNORMAL LOW (ref 8.9–10.3)
Creatinine, Ser: 0.69 mg/dL (ref 0.44–1.00)
GFR calc Af Amer: >60 mL/min (ref >60)
GFR calc non Af Amer: >60 mL/min (ref >60)
Glucose, Bld: 131 mg/dL — ABNORMAL HIGH (ref 70–99)
Potassium: 3.7 mmol/L (ref 3.5–5.1)
SODIUM: 138 mmol/L (ref 135–145)

## 2018-02-10 LAB — HEPATIC FUNCTION PANEL
ALBUMIN: 2.9 g/dL — AB (ref 3.5–5.0)
ALT: 19 U/L (ref 0–44)
AST: 24 U/L (ref 15–41)
Alkaline Phosphatase: 80 U/L (ref 38–126)
Bilirubin, Direct: 0.2 mg/dL (ref 0.0–0.2)
Indirect Bilirubin: 0.7 mg/dL (ref 0.3–0.9)
Total Bilirubin: 0.9 mg/dL (ref 0.3–1.2)
Total Protein: 6.2 g/dL — ABNORMAL LOW (ref 6.5–8.1)

## 2018-02-10 LAB — TSH: TSH: 8.636 u[IU]/mL — AB (ref 0.350–4.500)

## 2018-02-10 LAB — PROTIME-INR
INR: 1.03
PROTHROMBIN TIME: 13.4 s (ref 11.4–15.2)

## 2018-02-10 LAB — MAGNESIUM: Magnesium: 2.1 mg/dL (ref 1.7–2.4)

## 2018-02-10 LAB — BRAIN NATRIURETIC PEPTIDE: B Natriuretic Peptide: 550.4 pg/mL — ABNORMAL HIGH (ref 0.0–100.0)

## 2018-02-10 LAB — TROPONIN I

## 2018-02-10 MED ORDER — GABAPENTIN 300 MG PO CAPS
300.0000 mg | ORAL_CAPSULE | Freq: Every day | ORAL | Status: DC
  Administered 2018-02-11 – 2018-02-14 (×4): 300 mg via ORAL
  Filled 2018-02-10 (×4): qty 1

## 2018-02-10 MED ORDER — FLUTICASONE PROPIONATE 50 MCG/ACT NA SUSP
1.0000 | Freq: Every day | NASAL | Status: DC | PRN
  Filled 2018-02-10: qty 16

## 2018-02-10 MED ORDER — DIPHENHYDRAMINE HCL 25 MG PO CAPS: 25.0000 mg | ORAL_CAPSULE | Freq: Three times a day (TID) | ORAL | Status: DC | PRN

## 2018-02-10 MED ORDER — ACETAMINOPHEN 500 MG PO TABS
500.0000 mg | ORAL_TABLET | Freq: Four times a day (QID) | ORAL | Status: DC | PRN
  Administered 2018-02-10 – 2018-02-14 (×12): 500 mg via ORAL
  Filled 2018-02-10 (×12): qty 1

## 2018-02-10 MED ORDER — GUAIFENESIN-DM 100-10 MG/5ML PO SYRP
5.0000 mL | ORAL_SOLUTION | ORAL | Status: DC | PRN
  Administered 2018-02-10: 5 mL via ORAL
  Filled 2018-02-10: qty 5

## 2018-02-10 MED ORDER — FUROSEMIDE 10 MG/ML IJ SOLN
40.0000 mg | Freq: Two times a day (BID) | INTRAMUSCULAR | Status: DC
  Administered 2018-02-10 – 2018-02-11 (×2): 40 mg via INTRAVENOUS
  Filled 2018-02-10 (×2): qty 4

## 2018-02-10 MED ORDER — CALCIUM CITRATE-VITAMIN D3 315-250 MG-UNIT PO TABS: 1.0000 | ORAL_TABLET | Freq: Two times a day (BID) | ORAL | Status: DC

## 2018-02-10 MED ORDER — NITROGLYCERIN 0.4 MG SL SUBL: 0.4000 mg | SUBLINGUAL_TABLET | SUBLINGUAL | Status: DC | PRN

## 2018-02-10 MED ORDER — VITAMIN B-12 1000 MCG PO TABS
1000.0000 ug | ORAL_TABLET | Freq: Every day | ORAL | Status: DC
  Administered 2018-02-11 – 2018-02-14 (×4): 1000 ug via ORAL
  Filled 2018-02-10 (×4): qty 1

## 2018-02-10 MED ORDER — PANTOPRAZOLE SODIUM 40 MG PO TBEC
40.0000 mg | DELAYED_RELEASE_TABLET | Freq: Every day | ORAL | Status: DC
  Administered 2018-02-11 – 2018-02-14 (×4): 40 mg via ORAL
  Filled 2018-02-10 (×4): qty 1

## 2018-02-10 MED ORDER — GABAPENTIN 300 MG PO CAPS
600.0000 mg | ORAL_CAPSULE | Freq: Every day | ORAL | Status: DC
  Administered 2018-02-10 – 2018-02-13 (×4): 600 mg via ORAL
  Filled 2018-02-10 (×4): qty 2

## 2018-02-10 MED ORDER — PRAMIPEXOLE DIHYDROCHLORIDE 0.25 MG PO TABS
0.2500 mg | ORAL_TABLET | Freq: Every morning | ORAL | Status: DC
  Administered 2018-02-11 – 2018-02-14 (×4): 0.25 mg via ORAL
  Filled 2018-02-10 (×4): qty 1

## 2018-02-10 MED ORDER — AMMONIUM LACTATE 12 % EX LOTN
1.0000 "application " | TOPICAL_LOTION | Freq: Two times a day (BID) | CUTANEOUS | Status: DC
  Administered 2018-02-10 – 2018-02-13 (×7): 1 via TOPICAL
  Filled 2018-02-10 (×2): qty 225

## 2018-02-10 MED ORDER — ATORVASTATIN CALCIUM 40 MG PO TABS
40.0000 mg | ORAL_TABLET | Freq: Every day | ORAL | Status: DC
  Administered 2018-02-11 – 2018-02-14 (×3): 40 mg via ORAL
  Filled 2018-02-10 (×4): qty 1

## 2018-02-10 MED ORDER — SODIUM CHLORIDE 0.9% FLUSH: 3.0000 mL | INTRAVENOUS | Status: DC | PRN

## 2018-02-10 MED ORDER — LIVING BETTER WITH HEART FAILURE BOOK
Freq: Once | Status: AC
  Administered 2018-02-10: 15:00:00

## 2018-02-10 MED ORDER — SODIUM CHLORIDE 0.9 % IV SOLN
250.0000 mL | INTRAVENOUS | Status: DC | PRN
  Administered 2018-02-13: 14:00:00 via INTRAVENOUS

## 2018-02-10 MED ORDER — FLUOXETINE HCL 20 MG PO CAPS
60.0000 mg | ORAL_CAPSULE | Freq: Every day | ORAL | Status: DC
  Administered 2018-02-11 – 2018-02-14 (×4): 60 mg via ORAL
  Filled 2018-02-10 (×4): qty 3

## 2018-02-10 MED ORDER — ALBUTEROL SULFATE (2.5 MG/3ML) 0.083% IN NEBU: 3.0000 mL | INHALATION_SOLUTION | Freq: Four times a day (QID) | RESPIRATORY_TRACT | Status: DC | PRN

## 2018-02-10 MED ORDER — LOSARTAN POTASSIUM 25 MG PO TABS
25.0000 mg | ORAL_TABLET | Freq: Every day | ORAL | Status: DC
Start: 2018-02-11 — End: 2018-02-11
  Administered 2018-02-11: 25 mg via ORAL
  Filled 2018-02-10: qty 1

## 2018-02-10 MED ORDER — APIXABAN 5 MG PO TABS
5.0000 mg | ORAL_TABLET | Freq: Two times a day (BID) | ORAL | Status: DC
  Administered 2018-02-10 – 2018-02-14 (×8): 5 mg via ORAL
  Filled 2018-02-10 (×8): qty 1

## 2018-02-10 MED ORDER — ADULT MULTIVITAMIN W/MINERALS CH
1.0000 | ORAL_TABLET | Freq: Every day | ORAL | Status: DC
  Administered 2018-02-11 – 2018-02-14 (×4): 1 via ORAL
  Filled 2018-02-10 (×4): qty 1

## 2018-02-10 MED ORDER — ONDANSETRON HCL 4 MG/2ML IJ SOLN: 4.0000 mg | Freq: Four times a day (QID) | INTRAMUSCULAR | Status: DC | PRN

## 2018-02-10 MED ORDER — GABAPENTIN 300 MG PO CAPS: 300.0000 mg | ORAL_CAPSULE | Freq: Two times a day (BID) | ORAL | Status: DC

## 2018-02-10 MED ORDER — TRAMADOL HCL 50 MG PO TABS
50.0000 mg | ORAL_TABLET | Freq: Once | ORAL | Status: AC
  Administered 2018-02-10: 50 mg via ORAL
  Filled 2018-02-10: qty 1

## 2018-02-10 MED ORDER — DIPHENHYDRAMINE HCL 25 MG PO TABS
25.0000 mg | ORAL_TABLET | Freq: Three times a day (TID) | ORAL | Status: DC | PRN
  Filled 2018-02-10: qty 1

## 2018-02-10 MED ORDER — FERROUS SULFATE 325 (65 FE) MG PO TABS
325.0000 mg | ORAL_TABLET | Freq: Two times a day (BID) | ORAL | Status: DC
  Administered 2018-02-10 – 2018-02-11 (×3): 325 mg via ORAL
  Filled 2018-02-10 (×3): qty 1

## 2018-02-10 MED ORDER — MELATONIN 3 MG PO TABS
9.0000 mg | ORAL_TABLET | Freq: Every day | ORAL | Status: DC
  Administered 2018-02-10 – 2018-02-13 (×4): 9 mg via ORAL
  Filled 2018-02-10 (×4): qty 3

## 2018-02-10 MED ORDER — CALCIUM CARBONATE-VITAMIN D 500-200 MG-UNIT PO TABS
1.0000 | ORAL_TABLET | Freq: Two times a day (BID) | ORAL | Status: DC
  Administered 2018-02-10 – 2018-02-11 (×3): 1 via ORAL
  Filled 2018-02-10 (×3): qty 1

## 2018-02-10 MED ORDER — SODIUM CHLORIDE 0.9% FLUSH
3.0000 mL | Freq: Two times a day (BID) | INTRAVENOUS | Status: DC
  Administered 2018-02-10 – 2018-02-12 (×5): 3 mL via INTRAVENOUS

## 2018-02-10 MED ORDER — METOPROLOL TARTRATE 25 MG PO TABS
25.0000 mg | ORAL_TABLET | Freq: Four times a day (QID) | ORAL | Status: DC
  Administered 2018-02-10 – 2018-02-14 (×15): 25 mg via ORAL
  Filled 2018-02-10 (×17): qty 1

## 2018-02-10 MED ORDER — POTASSIUM CHLORIDE CRYS ER 20 MEQ PO TBCR
20.0000 meq | EXTENDED_RELEASE_TABLET | ORAL | Status: DC
  Administered 2018-02-11 – 2018-02-13 (×2): 20 meq via ORAL
  Filled 2018-02-10 (×2): qty 1

## 2018-02-10 MED ORDER — AMITRIPTYLINE HCL 25 MG PO TABS
25.0000 mg | ORAL_TABLET | Freq: Every day | ORAL | Status: DC
  Administered 2018-02-10 – 2018-02-13 (×4): 25 mg via ORAL
  Filled 2018-02-10 (×4): qty 1

## 2018-02-10 NOTE — Progress Notes (Signed)
Patient arrived, no complaints, MD paged.

## 2018-02-10 NOTE — H&P (Signed)
I was asked to facilitate inpatient orders. Please see office note from Dr. Marlou Porch which serves as this patient's admission H/P. Orders were written in accordance with plan. Spoke with patient to clarify which meds she already took today. Med rec is pending finalization by pharmacy tech but appears accurate per discussion with patient, except no longer on abx (pulled in from a 2018 admission). Labs have been ordered as well. Regarding apixaban, ordered apixaban per pharmacy to ensure she will get education component as well. Melina Copa PA-C     Cardiology history and physical   Date:  02/10/2018   ID:  Meredith Gonzalez, DOB 06-30-1935, MRN [id number]  PCP:  Sinclair Ship, MD             Cardiologist:  Dorris Carnes, MD  Dr. Dorris Carnes Electrophysiologist:  Cristopher Peru, MD Dr. Cristopher Peru  Referring MD: Sinclair Ship, MD     History of Present Illness:    Meredith Gonzalez is a 82 y.o. female here for the evaluation of bilateral leg swelling at the request of Dr. Vertell Limber  She has seen Dr. Cristopher Peru in the past for her pacemaker in the electrophysiology department.  Ejection fraction is approximately 40%.  Has hypertension paroxysmal atrial fibrillation and sinus node dysfunction leading to pacemaker, CAD post PCI to diagonal early 2018.  Remote history of subdural hematoma.  She was not on anticoagulation because of this previously.  In early October, she fell and fractured her C-spine.  She was in a neck brace until yesterday.  Dr. Vertell Limber has been evaluating her.  Over the last 6 weeks or so, she has gained approximately 15 pounds, her legs are becoming more tense, she has developed a worsening cough and orthopnea, having trouble laying down to sleep.  She would have to stop walking from here to the elevator.  She is not having any chest pain. She is trying to alleviate her cough with cough drops and Delsym but without success. Her symptoms currently are quite severe.      Past Medical  History:  Diagnosis Date  . Anemia   . Arthritis   . Diabetes mellitus without complication ([place])   . Fibromyalgia   . Fracture of right humerus   . Generalized weakness   . History of echocardiogram    Echo 8/18: EF 50-55, no RWMA, Gr 1 DD, calcified AV leaflets, MAC, trivial MR, mod LAE, PASP 37  . Hypothyroidism   . Incontinence of urine   . Major depression, chronic   . Neuropathy   . Pacemaker   . Recurrent falls   . Stroke ([place]) 2000  . Syncope and collapse          Past Surgical History:  Procedure Laterality Date  . ABDOMINAL HYSTERECTOMY    . basal cell cancer removal    . CARPAL TUNNEL RELEASE     bilateral  . CHOLECYSTECTOMY    . CHOLECYSTECTOMY    . COLONOSCOPY    . CORONARY STENT INTERVENTION N/A 07/19/2016   Procedure: Coronary Stent Intervention;  Surgeon: Peter M Martinique, MD;  Location: [facility];  Service: Cardiovascular;  Laterality: N/A;  . EYE SURGERY     right eye catarace/lens implant  . FEMUR FRACTURE SURGERY     bilateral  . GASTRIC BYPASS    . LEAD REVISION N/A 01/01/2013   Procedure: LEAD REVISION;  Surgeon: Evans Lance, MD;  Location: [facility];  Service: Cardiovascular;  Laterality: N/A;  . LEFT  HEART CATH AND CORONARY ANGIOGRAPHY N/A 07/19/2016   Procedure: Left Heart Cath and Coronary Angiography;  Surgeon: Peter M Martinique, MD;  Location: [facility];  Service: Cardiovascular;  Laterality: N/A;  . PACEMAKER INSERTION    . right ankle surgery      Current Medications: ActiveMedications      Current Meds  Medication Sig  . acetaminophen (TYLENOL) 500 MG tablet Take 500 mg by mouth as needed for moderate pain.   Marland Kitchen albuterol (PROVENTIL HFA;VENTOLIN HFA) 108 (90 Base) MCG/ACT inhaler Inhale 2 puffs into the lungs every 6 (six) hours as needed.  Marland Kitchen amitriptyline (ELAVIL) 25 MG tablet Take 25 mg by mouth at bedtime.  Marland Kitchen ammonium lactate (LAC-HYDRIN) 12 % lotion Apply 1  application topically 2 (two) times daily.  Marland Kitchen aspirin EC 81 MG tablet Take 1 tablet (81 mg total) by mouth daily.  Marland Kitchen atorvastatin (LIPITOR) 40 MG tablet Take 1 tablet (40 mg total) by mouth daily.  . Calcium Citrate-Vitamin D3 315-250 MG-UNIT TABS Take 1 tablet by mouth 2 (two) times daily.  . cephALEXin (KEFLEX) 500 MG capsule Take 1 capsule (500 mg total) by mouth 4 (four) times daily.  . clopidogrel (PLAVIX) 75 MG tablet Take 1 tablet (75 mg total) by mouth daily with breakfast.  . diphenhydrAMINE (BENADRYL ALLERGY) 25 MG tablet Take 25 mg by mouth every 8 (eight) hours as needed.  . ferrous sulfate 325 (65 FE) MG tablet Take 1 tablet (325 mg total) by mouth 2 (two) times daily with a meal.  . flintstones complete (FLINTSTONES) 60 MG chewable tablet Chew 1 tablet by mouth daily.  Marland Kitchen FLUoxetine (PROZAC) 20 MG capsule Take 60 mg by mouth daily.   . fluticasone (FLONASE) 50 MCG/ACT nasal spray Place 1 spray into both nostrils as needed.  . Furosemide (LASIX PO) Take 60 mg by mouth daily. Patient reports taking 67m in he morning and 211mat 3pm every day.  . gabapentin (NEURONTIN) 300 MG capsule Take 300-600 mg by mouth 2 (two) times daily. 300 mg in the mornning; 600 mg at night  . HYDROcodone-acetaminophen (NORCO/VICODIN) 5-325 MG tablet Take 1 tablet by mouth every 6 (six) hours as needed.  . Marland Kitchenosartan (COZAAR) 25 MG tablet Take 1 tablet (25 mg total) by mouth daily.  . Melatonin 10 MG TABS Take 40 mg by mouth at bedtime.  . Multiple Vitamins-Minerals (MULTIVITAMIN ADULT) TABS Take 1 tablet by mouth daily.  . nitroGLYCERIN (NITROSTAT) 0.4 MG SL tablet Place 1 tablet (0.4 mg total) under the tongue every 5 (five) minutes as needed for chest pain.  . pantoprazole (PROTONIX) 40 MG tablet Take 1 tablet (40 mg total) by mouth daily. Can switch to any PPI  . pindolol (VISKEN) 5 MG tablet Take 1 tablet (5 mg total) by mouth 2 (two) times daily.  . potassium chloride SA (KLOR-CON M20) 20 MEQ tablet Take  1 tablet (20 mEq total) by mouth 3 (three) times a week. Monday, Wednesday & Friday  . pramipexole (MIRAPEX) 0.25 MG tablet Take 0.25 mg by mouth every morning.  . vitamin B-12 (CYANOCOBALAMIN) 1000 MCG tablet Take 1 tablet (1,000 mcg total) by mouth daily.       Allergies:   Valium [diazepam]; Darifenacin; Darvon [propoxyphene hcl]; Daypro [oxaprozin]; Enablex [darifenacin hydrobromide er]; Propoxyphene; Risperdal [risperidone]; Talwin [pentazocine]; Vicodin [hydrocodone-acetaminophen]; Codeine; and Oxycodone   Social History        Socioeconomic History  . Marital status: Widowed    Spouse name: Not on file  .  Number of children: Not on file  . Years of education: Not on file  . Highest education level: Not on file  Occupational History  . Not on file  Social Needs  . Financial resource strain: Not on file  . Food insecurity:    Worry: Not on file    Inability: Not on file  . Transportation needs:    Medical: Not on file    Non-medical: Not on file  Tobacco Use  . Smoking status: Former Smoker    Packs/day: 0.50    Years: 4.00    Pack years: 2.00    Types: Cigarettes    Last attempt to quit: 08/07/1972    Years since quitting: 45.5  . Smokeless tobacco: Never Used  Substance and Sexual Activity  . Alcohol use: No    Frequency: Never  . Drug use: No  . Sexual activity: Not on file  Lifestyle  . Physical activity:    Days per week: Not on file    Minutes per session: Not on file  . Stress: Not on file  Relationships  . Social connections:    Talks on phone: Not on file    Gets together: Not on file    Attends religious service: Not on file    Active member of club or organization: Not on file    Attends meetings of clubs or organizations: Not on file    Relationship status: Not on file  Other Topics Concern  . Not on file  Social History Narrative  . Not on file     Family History: The patient's family history  includes Alcoholism in her brother, brother, father, and sister; Congestive Heart Failure in her mother; Heart attack in her father; Throat cancer in her brother.  ROS:   Please see the history of present illness.     All other systems reviewed and are negative.  EKGs/Labs/Other Studies Reviewed:    The following studies were reviewed today:  ECHO 10/23/16: - Left ventricle: The cavity size was normal. Systolic function was normal. The estimated ejection fraction was in the range of 50% to 55%. Wall motion was normal; there were no regional wall motion abnormalities. Doppler parameters are consistent with abnormal left ventricular relaxation (grade 1 diastolic dysfunction). - Aortic valve: Trileaflet; moderately thickened, moderately calcified leaflets. - Mitral valve: Calcified annulus. There was trivial regurgitation. - Left atrium: The atrium was moderately dilated. Volume/bsa, S: 44.1 ml/m^2. - Right ventricle: Pacer wire or catheter noted in right ventricle. - Pulmonary arteries: Systolic pressure was mildly increased. PA peak pressure: 37 mm Hg (S).  Impressions:  - Compared to the prior study, there has been no significant interval change.  Cath 07/19/16:  Prox RCA to Mid RCA lesion, 20 %stenosed.  There is moderate left ventricular systolic dysfunction.  LV end diastolic pressure is mildly elevated.  The left ventricular ejection fraction is 35-45% by visual estimate.  1st Diag lesion, 95 %stenosed.  A STENT PROMUS PREM MR 2.5X16 drug eluting stent was successfully placed.  Post intervention, there is a 0% residual stenosis.  1. Single vessel occlusive CAD. 95% large diagonal branch 2. Moderate LV dysfunction. EF 35-40%. 3. Mildly elevated LVEDP 4. Successful stenting of the first diagonal with DES  Plan: DAPT for one year.  EKG:  EKG is ordered today.  The ekg ordered today demonstrates atrial fibrillation with rapid ventricular  response, left axis deviation, nonspecific interventricular conduction delay, right bundle branch block appearing, heart rate 117 bpm.  Recent Labs: No results found for requested labs within last 8760 hours.  Recent Lipid Panel Labs(Brief)     Component Value Date/Time   CHOL 95 (L) 03/20/2017 0728   TRIG 45 03/20/2017 0728   HDL 58 03/20/2017 0728   CHOLHDL 1.6 03/20/2017 0728   CHOLHDL 2.7 07/18/2016 0349   VLDL 9 07/18/2016 0349   LDLCALC 28 03/20/2017 0728      Physical Exam:    VS:  BP 136/70   Pulse 87   Ht _0  (1.651 m)   Wt 153 lb 12.8 oz (69.8 kg)   SpO2 (!) 79%   BMI 25.59 kg/m        Wt Readings from Last 3 Encounters:  02/10/18 153 lb 12.8 oz (69.8 kg)  12/28/17 140 lb 12.8 oz (63.9 kg)  12/16/17 141 lb 12.8 oz (64.3 kg)     GEN: Elderly Well nourished, well developed in no acute distress HEENT: Normal NECK: + Jaw line JVD; No carotid bruits LYMPHATICS: No lymphadenopathy CARDIAC: Irreg irreg tachy, no murmurs, rubs, gallops RESPIRATORY: Crackles heard at bases bilaterally, mildly increased respiratory effort, frequent cough ABDOMEN: Soft, non-tender, non-distended MUSCULOSKELETAL:  Tense BLE edema; No deformity, Left hand edema  SKIN: Warm and dry NEUROLOGIC:  Alert and oriented x 3 PSYCHIATRIC:  Normal affect   ASSESSMENT:    1. Acute on chronic systolic heart failure ([place])   2. PAF (paroxysmal atrial fibrillation) ([place])   3. Essential hypertension   4. Ischemic cardiomyopathy   5. Paroxysmal atrial fibrillation ([facility])   6. Cardiac pacemaker in situ    PLAN:    In order of problems listed above:  Acute on chronic systolic heart failure - EF most recently checked in 2018 on echocardiogram was 55% but prior to that on heart catheterization was 35 to 40%.  She is currently volume overloaded up about 13 pounds.  Her dry weight is approximately 140 pounds (currently 153).  She is taking 40 mg a.m. of Lasix and 20 mg p.m. of  Lasix. - NYHA class IV symptoms - We have discussed admission to the hospital.  She is willing.  We will place her inpatient.  She we will require IV Lasix and close monitoring. - Check Echocardiogram - Place on Lasix 40 mg IV twice daily.  If inadequate response, increased to 80 mg IV twice daily.  Paroxysmal atrial fibrillation -I also think that her atrial fibrillation is currently playing a role in her symptomatology as well.  She is rapid ventricular response.  Her beta-blocker currently is pindolol.  We will switch this to metoprolol tartrate 25 mg every 6 hours to help with improved rate control. - It is been several years since her subdural hematoma.  Her most recent head CT in October 2019 in the setting of her neck fracture did not show any evidence of residual bleed.  Despite her falls, I think would be important for her to be on anticoagulation at this point and would like to start her on Eliquis 5 mg p.o. twice daily.  Since her coronary artery disease has been stable and it is been over 1 year since her diagonal stent placement, I will discontinue her Plavix and aspirin. -If we are unable to successfully rate control her, remembering she has a pacemaker, we may need to perform a TEE guided cardioversion after she has received 3 doses of Eliquis.  Coronary artery disease - Diagonal stent placed on 07/19/2016 by Dr. Martinique.  EF was 35%, return to  55% on echocardiogram at that time.  Dual antiplatelet therapy was utilized.  Since I am going to start her on Eliquis for her atrial fibrillation, we will discontinue her aspirin and Plavix. -Continue with statin  Sinus node dysfunction -Pacemaker-Biotronik DDD-Dr. Lovena Le.  Functioning normally.  When she has her generator change, he will place subpectoral.  Patient meets criteria for inpatient status.  She will need more than 2 midnights stay in the hospital for treatment of her acute systolic heart failure as well as atrial fibrillation  with rapid ventricular response.  She will require IV medications for her treatment.  She has findings of fluid overload on exam.  Previous echocardiograms have shown reduced ejection fraction.   Medication Adjustments/Labs and Tests Ordered: Current medicines are reviewed at length with the patient today.  Concerns regarding medicines are outlined above.     Orders Placed This Encounter  Procedures  . EKG 12-Lead   No orders of the defined types were placed in this encounter.   Patient Instructions  Admission today for Acute systolic heart failure    Signed, Candee Furbish, MD  02/10/2018 12:07 PM    [place]

## 2018-02-10 NOTE — Progress Notes (Signed)
Patient requesting something for cough. Placed order for Robitussin DM per standing order. Will continue to monitor patient.

## 2018-02-10 NOTE — Plan of Care (Signed)
  Problem: Education: °Goal: Knowledge of General Education information will improve °Description: Including pain rating scale, medication(s)/side effects and non-pharmacologic comfort measures °Outcome: Progressing °  °Problem: Clinical Measurements: °Goal: Ability to maintain clinical measurements within normal limits will improve °Outcome: Progressing °  °Problem: Clinical Measurements: °Goal: Diagnostic test results will improve °Outcome: Progressing °  °Problem: Clinical Measurements: °Goal: Cardiovascular complication will be avoided °Outcome: Progressing °  °Problem: Pain Managment: °Goal: General experience of comfort will improve °Outcome: Progressing °  °

## 2018-02-10 NOTE — Progress Notes (Addendum)
Labs reviewed, notable for hypoalbuminemia of 2.9, BNP 550, negative troponin, Hgb 8.8, and elevated TSH. Spoke with patient - denies any evidence of bleeding whatsoever. She reports "I stay anemic" and Epic labs would certainly corroborate prior h/o Hgb even down into the 7 range at times. This was 9-11 in 2018. Will need to follow closely on anticoagulation. Will order hemoccults and f/u CBC in AM. Pt to notify of any evidence of blood loss. F/u anemia panel and fT4 in AM as well. Kirke Breach PA-C  Addendum: spoke with Dr. Marlou Porch to confirm still OK with Eliquis - he would like to proceed and agrees with plan above. Sharnese Heath PA-C

## 2018-02-10 NOTE — Patient Instructions (Signed)
Admission today for Acute systolic heart failure

## 2018-02-10 NOTE — Progress Notes (Signed)
Cardiology history and physical   Date:  02/10/2018   ID:  Meredith Gonzalez, DOB Mar 18, 1936, MRN [id number]  PCP:  Sinclair Ship, MD  Cardiologist:  Dorris Carnes, MD  Dr. Dorris Carnes Electrophysiologist:  Cristopher Peru, MD Dr. Cristopher Peru  Referring MD: Sinclair Ship, MD     History of Present Illness:    Meredith Gonzalez is a 82 y.o. female here for the evaluation of bilateral leg swelling at the request of Dr. Vertell Limber  She has seen Dr. Cristopher Peru in the past for her pacemaker in the electrophysiology department.  Ejection fraction is approximately 40%.  Has hypertension paroxysmal atrial fibrillation and sinus node dysfunction leading to pacemaker, CAD post PCI to diagonal early 2018.  Remote history of subdural hematoma.  She was not on anticoagulation because of this previously.  In early October, she fell and fractured her C-spine.  She was in a neck brace until yesterday.  Dr. Vertell Limber has been evaluating her.  Over the last 6 weeks or so, she has gained approximately 15 pounds, her legs are becoming more tense, she has developed a worsening cough and orthopnea, having trouble laying down to sleep.  She would have to stop walking from here to the elevator.  She is not having any chest pain. She is trying to alleviate her cough with cough drops and Delsym but without success. Her symptoms currently are quite severe.  Past Medical History:  Diagnosis Date  . Anemia   . Arthritis   . Diabetes mellitus without complication ([place])   . Fibromyalgia   . Fracture of right humerus   . Generalized weakness   . History of echocardiogram    Echo 8/18: EF 50-55, no RWMA, Gr 1 DD, calcified AV leaflets, MAC, trivial MR, mod LAE, PASP 37  . Hypothyroidism   . Incontinence of urine   . Major depression, chronic   . Neuropathy   . Pacemaker   . Recurrent falls   . Stroke ([place]) 2000  . Syncope and collapse     Past Surgical History:  Procedure Laterality Date  . ABDOMINAL HYSTERECTOMY    . basal  cell cancer removal    . CARPAL TUNNEL RELEASE     bilateral  . CHOLECYSTECTOMY    . CHOLECYSTECTOMY    . COLONOSCOPY    . CORONARY STENT INTERVENTION N/A 07/19/2016   Procedure: Coronary Stent Intervention;  Surgeon: Peter M Martinique, MD;  Location: [facility];  Service: Cardiovascular;  Laterality: N/A;  . EYE SURGERY     right eye catarace/lens implant  . FEMUR FRACTURE SURGERY     bilateral  . GASTRIC BYPASS    . LEAD REVISION N/A 01/01/2013   Procedure: LEAD REVISION;  Surgeon: Evans Lance, MD;  Location[facility] CATH LAB;  Service: Cardiovascular;  Laterality: N/A;  . LEFT HEART CATH AND CORONARY ANGIOGRAPHY N/A 07/19/2016   Procedure: Left Heart Cath and Coronary Angiography;  Surgeon: Peter M Martinique, MD;  Location: [facility];  Service: Cardiovascular;  Laterality: N/A;  . PACEMAKER INSERTION    . right ankle surgery      Current Medications: Current Meds  Medication Sig  . acetaminophen (TYLENOL) 500 MG tablet Take 500 mg by mouth as needed for moderate pain.   Marland Kitchen albuterol (PROVENTIL HFA;VENTOLIN HFA) 108 (90 Base) MCG/ACT inhaler Inhale 2 puffs into the lungs every 6 (six) hours as needed.  Marland Kitchen amitriptyline (ELAVIL) 25 MG tablet Take 25 mg by mouth at bedtime.  Marland Kitchen  ammonium lactate (LAC-HYDRIN) 12 % lotion Apply 1 application topically 2 (two) times daily.  Marland Kitchen aspirin EC 81 MG tablet Take 1 tablet (81 mg total) by mouth daily.  Marland Kitchen atorvastatin (LIPITOR) 40 MG tablet Take 1 tablet (40 mg total) by mouth daily.  . Calcium Citrate-Vitamin D3 315-250 MG-UNIT TABS Take 1 tablet by mouth 2 (two) times daily.  . cephALEXin (KEFLEX) 500 MG capsule Take 1 capsule (500 mg total) by mouth 4 (four) times daily.  . clopidogrel (PLAVIX) 75 MG tablet Take 1 tablet (75 mg total) by mouth daily with breakfast.  . diphenhydrAMINE (BENADRYL ALLERGY) 25 MG tablet Take 25 mg by mouth every 8 (eight) hours as needed.  . ferrous sulfate 325 (65 FE) MG tablet Take 1 tablet (325 mg total) by  mouth 2 (two) times daily with a meal.  . flintstones complete (FLINTSTONES) 60 MG chewable tablet Chew 1 tablet by mouth daily.  Marland Kitchen FLUoxetine (PROZAC) 20 MG capsule Take 60 mg by mouth daily.   . fluticasone (FLONASE) 50 MCG/ACT nasal spray Place 1 spray into both nostrils as needed.  . Furosemide (LASIX PO) Take 60 mg by mouth daily. Patient reports taking 40m in he morning and 266mat 3pm every day.  . gabapentin (NEURONTIN) 300 MG capsule Take 300-600 mg by mouth 2 (two) times daily. 300 mg in the mornning; 600 mg at night  . HYDROcodone-acetaminophen (NORCO/VICODIN) 5-325 MG tablet Take 1 tablet by mouth every 6 (six) hours as needed.  . Marland Kitchenosartan (COZAAR) 25 MG tablet Take 1 tablet (25 mg total) by mouth daily.  . Melatonin 10 MG TABS Take 40 mg by mouth at bedtime.  . Multiple Vitamins-Minerals (MULTIVITAMIN ADULT) TABS Take 1 tablet by mouth daily.  . nitroGLYCERIN (NITROSTAT) 0.4 MG SL tablet Place 1 tablet (0.4 mg total) under the tongue every 5 (five) minutes as needed for chest pain.  . pantoprazole (PROTONIX) 40 MG tablet Take 1 tablet (40 mg total) by mouth daily. Can switch to any PPI  . pindolol (VISKEN) 5 MG tablet Take 1 tablet (5 mg total) by mouth 2 (two) times daily.  . potassium chloride SA (KLOR-CON M20) 20 MEQ tablet Take 1 tablet (20 mEq total) by mouth 3 (three) times a week. Monday, Wednesday & Friday  . pramipexole (MIRAPEX) 0.25 MG tablet Take 0.25 mg by mouth every morning.  . vitamin B-12 (CYANOCOBALAMIN) 1000 MCG tablet Take 1 tablet (1,000 mcg total) by mouth daily.     Allergies:   Valium [diazepam]; Darifenacin; Darvon [propoxyphene hcl]; Daypro [oxaprozin]; Enablex [darifenacin hydrobromide er]; Propoxyphene; Risperdal [risperidone]; Talwin [pentazocine]; Vicodin [hydrocodone-acetaminophen]; Codeine; and Oxycodone   Social History   Socioeconomic History  . Marital status: Widowed    Spouse name: Not on file  . Number of children: Not on file  . Years of  education: Not on file  . Highest education level: Not on file  Occupational History  . Not on file  Social Needs  . Financial resource strain: Not on file  . Food insecurity:    Worry: Not on file    Inability: Not on file  . Transportation needs:    Medical: Not on file    Non-medical: Not on file  Tobacco Use  . Smoking status: Former Smoker    Packs/day: 0.50    Years: 4.00    Pack years: 2.00    Types: Cigarettes    Last attempt to quit: 08/07/1972    Years since quitting: 45.5  . Smokeless  tobacco: Never Used  Substance and Sexual Activity  . Alcohol use: No    Frequency: Never  . Drug use: No  . Sexual activity: Not on file  Lifestyle  . Physical activity:    Days per week: Not on file    Minutes per session: Not on file  . Stress: Not on file  Relationships  . Social connections:    Talks on phone: Not on file    Gets together: Not on file    Attends religious service: Not on file    Active member of club or organization: Not on file    Attends meetings of clubs or organizations: Not on file    Relationship status: Not on file  Other Topics Concern  . Not on file  Social History Narrative  . Not on file     Family History: The patient's family history includes Alcoholism in her brother, brother, father, and sister; Congestive Heart Failure in her mother; Heart attack in her father; Throat cancer in her brother.  ROS:   Please see the history of present illness.     All other systems reviewed and are negative.  EKGs/Labs/Other Studies Reviewed:    The following studies were reviewed today:  ECHO 10/23/16: - Left ventricle: The cavity size was normal. Systolic function was   normal. The estimated ejection fraction was in the range of 50%   to 55%. Wall motion was normal; there were no regional wall   motion abnormalities. Doppler parameters are consistent with   abnormal left ventricular relaxation (grade 1 diastolic   dysfunction). - Aortic valve:  Trileaflet; moderately thickened, moderately   calcified leaflets. - Mitral valve: Calcified annulus. There was trivial regurgitation. - Left atrium: The atrium was moderately dilated. Volume/bsa, S:   44.1 ml/m^2. - Right ventricle: Pacer wire or catheter noted in right ventricle. - Pulmonary arteries: Systolic pressure was mildly increased. PA   peak pressure: 37 mm Hg (S).  Impressions:  - Compared to the prior study, there has been no significant   interval change.  Cath 07/19/16:  Prox RCA to Mid RCA lesion, 20 %stenosed.  There is moderate left ventricular systolic dysfunction.  LV end diastolic pressure is mildly elevated.  The left ventricular ejection fraction is 35-45% by visual estimate.  1st Diag lesion, 95 %stenosed.  A STENT PROMUS PREM MR 2.5X16 drug eluting stent was successfully placed.  Post intervention, there is a 0% residual stenosis.   1. Single vessel occlusive CAD. 95% large diagonal branch 2. Moderate LV dysfunction. EF 35-40%. 3. Mildly elevated LVEDP 4. Successful stenting of the first diagonal with DES  Plan: DAPT for one year.  EKG:  EKG is ordered today.  The ekg ordered today demonstrates atrial fibrillation with rapid ventricular response, left axis deviation, nonspecific interventricular conduction delay, right bundle branch block appearing, heart rate 117 bpm.  Recent Labs: No results found for requested labs within last 8760 hours.  Recent Lipid Panel    Component Value Date/Time   CHOL 95 (L) 03/20/2017 0728   TRIG 45 03/20/2017 0728   HDL 58 03/20/2017 0728   CHOLHDL 1.6 03/20/2017 0728   CHOLHDL 2.7 07/18/2016 0349   VLDL 9 07/18/2016 0349   LDLCALC 28 03/20/2017 0728    Physical Exam:    VS:  BP 136/70   Pulse 87   Ht _0  (1.651 m)   Wt 153 lb 12.8 oz (69.8 kg)   SpO2 (!) 79%   BMI 25.59 kg/m  Wt Readings from Last 3 Encounters:  02/10/18 153 lb 12.8 oz (69.8 kg)  12/28/17 140 lb 12.8 oz (63.9 kg)    12/16/17 141 lb 12.8 oz (64.3 kg)     GEN: Elderly Well nourished, well developed in no acute distress HEENT: Normal NECK: + Jaw line JVD; No carotid bruits LYMPHATICS: No lymphadenopathy CARDIAC: Irreg irreg tachy, no murmurs, rubs, gallops RESPIRATORY: Crackles heard at bases bilaterally, mildly increased respiratory effort, frequent cough ABDOMEN: Soft, non-tender, non-distended MUSCULOSKELETAL:  Tense BLE edema; No deformity, Left hand edema  SKIN: Warm and dry NEUROLOGIC:  Alert and oriented x 3 PSYCHIATRIC:  Normal affect   ASSESSMENT:    1. Acute on chronic systolic heart failure ([place])   2. PAF (paroxysmal atrial fibrillation) ([place])   3. Essential hypertension   4. Ischemic cardiomyopathy   5. Paroxysmal atrial fibrillation ([facility])   6. Cardiac pacemaker in situ    PLAN:    In order of problems listed above:  Acute on chronic systolic heart failure - EF most recently checked in 2018 on echocardiogram was 55% but prior to that on heart catheterization was 35 to 40%.  She is currently volume overloaded up about 13 pounds.  Her dry weight is approximately 140 pounds (currently 153).  She is taking 40 mg a.m. of Lasix and 20 mg p.m. of Lasix. - NYHA class IV symptoms - We have discussed admission to the hospital.  She is willing.  We will place her inpatient.  She we will require IV Lasix and close monitoring. - Check Echocardiogram - Place on Lasix 40 mg IV twice daily.  If inadequate response, increased to 80 mg IV twice daily.  Paroxysmal atrial fibrillation -I also think that her atrial fibrillation is currently playing a role in her symptomatology as well.  She is rapid ventricular response.  Her beta-blocker currently is pindolol.  We will switch this to metoprolol tartrate 25 mg every 6 hours to help with improved rate control. - It is been several years since her subdural hematoma.  Her most recent head CT in October 2019 in the setting of her neck fracture did not  show any evidence of residual bleed.  Despite her falls, I think would be important for her to be on anticoagulation at this point and would like to start her on Eliquis 5 mg p.o. twice daily.  Since her coronary artery disease has been stable and it is been over 1 year since her diagonal stent placement, I will discontinue her Plavix and aspirin. -If we are unable to successfully rate control her, remembering she has a pacemaker, we may need to perform a TEE guided cardioversion after she has received 3 doses of Eliquis.  Coronary artery disease - Diagonal stent placed on 07/19/2016 by Dr. Martinique.  EF was 35%, return to 55% on echocardiogram at that time.  Dual antiplatelet therapy was utilized.  Since I am going to start her on Eliquis for her atrial fibrillation, we will discontinue her aspirin and Plavix. -Continue with statin  Sinus node dysfunction -Pacemaker-Biotronik DDD-Dr. Lovena Le.  Functioning normally.  When she has her generator change, he will place subpectoral.  Patient meets criteria for inpatient status.  She will need more than 2 midnights stay in the hospital for treatment of her acute systolic heart failure as well as atrial fibrillation with rapid ventricular response.  She will require IV medications for her treatment.  She has findings of fluid overload on exam.  Previous echocardiograms have shown  reduced ejection fraction.   Medication Adjustments/Labs and Tests Ordered: Current medicines are reviewed at length with the patient today.  Concerns regarding medicines are outlined above.  Orders Placed This Encounter  Procedures  . EKG 12-Lead   No orders of the defined types were placed in this encounter.   Patient Instructions  Admission today for Acute systolic heart failure    Signed, Candee Furbish, MD  02/10/2018 12:07 PM    [place]

## 2018-02-11 ENCOUNTER — Telehealth: Payer: Self-pay | Admitting: Internal Medicine

## 2018-02-11 ENCOUNTER — Inpatient Hospital Stay (HOSPITAL_COMMUNITY): Payer: Medicare Other

## 2018-02-11 ENCOUNTER — Encounter (HOSPITAL_COMMUNITY): Payer: Self-pay | Admitting: General Practice

## 2018-02-11 DIAGNOSIS — I340 Nonrheumatic mitral (valve) insufficiency: Secondary | ICD-10-CM

## 2018-02-11 DIAGNOSIS — I361 Nonrheumatic tricuspid (valve) insufficiency: Secondary | ICD-10-CM

## 2018-02-11 DIAGNOSIS — I5023 Acute on chronic systolic (congestive) heart failure: Secondary | ICD-10-CM

## 2018-02-11 DIAGNOSIS — I480 Paroxysmal atrial fibrillation: Secondary | ICD-10-CM

## 2018-02-11 LAB — CBC
HCT: 29.8 % — ABNORMAL LOW (ref 36.0–46.0)
Hemoglobin: 8.8 g/dL — ABNORMAL LOW (ref 12.0–15.0)
MCH: 28.6 pg (ref 26.0–34.0)
MCHC: 29.5 g/dL — AB (ref 30.0–36.0)
MCV: 96.8 fL (ref 80.0–100.0)
PLATELETS: 202 10*3/uL (ref 150–400)
RBC: 3.08 MIL/uL — ABNORMAL LOW (ref 3.87–5.11)
RDW: 16.8 % — AB (ref 11.5–15.5)
WBC: 6.1 10*3/uL (ref 4.0–10.5)
nRBC: 0 % (ref 0.0–0.2)

## 2018-02-11 LAB — IRON AND TIBC
IRON: 22 ug/dL — AB (ref 28–170)
Saturation Ratios: 7 % — ABNORMAL LOW (ref 10.4–31.8)
TIBC: 316 ug/dL (ref 250–450)
UIBC: 294 ug/dL

## 2018-02-11 LAB — ECHOCARDIOGRAM COMPLETE
HEIGHTINCHES: 65 in
WEIGHTICAEL: 2364.8 [oz_av]

## 2018-02-11 LAB — BASIC METABOLIC PANEL
ANION GAP: 8 (ref 5–15)
Anion gap: 8 (ref 5–15)
BUN: 25 mg/dL — AB (ref 8–23)
BUN: 28 mg/dL — AB (ref 8–23)
CALCIUM: 8.5 mg/dL — AB (ref 8.9–10.3)
CHLORIDE: 104 mmol/L (ref 98–111)
CO2: 26 mmol/L (ref 22–32)
CO2: 28 mmol/L (ref 22–32)
CREATININE: 1.09 mg/dL — AB (ref 0.44–1.00)
Calcium: 8.9 mg/dL (ref 8.9–10.3)
Chloride: 102 mmol/L (ref 98–111)
Creatinine, Ser: 1.12 mg/dL — ABNORMAL HIGH (ref 0.44–1.00)
GFR calc Af Amer: 52 mL/min — ABNORMAL LOW (ref >60)
GFR calc non Af Amer: 45 mL/min — ABNORMAL LOW (ref >60)
GFR, EST AFRICAN AMERICAN: 54 mL/min — AB (ref >60)
GFR, EST NON AFRICAN AMERICAN: 47 mL/min — AB (ref >60)
GLUCOSE: 241 mg/dL — AB (ref 70–99)
Glucose, Bld: 104 mg/dL — ABNORMAL HIGH (ref 70–99)
POTASSIUM: 4.1 mmol/L (ref 3.5–5.1)
Potassium: 3.7 mmol/L (ref 3.5–5.1)
SODIUM: 138 mmol/L (ref 135–145)
Sodium: 138 mmol/L (ref 135–145)

## 2018-02-11 LAB — RETICULOCYTES
IMMATURE RETIC FRACT: 16.9 % — AB (ref 2.3–15.9)
RBC.: 3.08 MIL/uL — AB (ref 3.87–5.11)
RETIC COUNT ABSOLUTE: 72.8 10*3/uL (ref 19.0–186.0)
Retic Ct Pct: 2.3 % (ref 0.4–3.1)

## 2018-02-11 LAB — VITAMIN B12: VITAMIN B 12: 326 pg/mL (ref 180–914)

## 2018-02-11 LAB — T4, FREE: Free T4: 0.97 ng/dL (ref 0.82–1.77)

## 2018-02-11 LAB — FERRITIN: Ferritin: 23 ng/mL (ref 11–307)

## 2018-02-11 LAB — FOLATE: Folate: 17.4 ng/mL (ref >5.9)

## 2018-02-11 MED ORDER — FUROSEMIDE 10 MG/ML IJ SOLN
80.0000 mg | Freq: Once | INTRAMUSCULAR | Status: AC
  Administered 2018-02-11: 80 mg via INTRAVENOUS
  Filled 2018-02-11: qty 8

## 2018-02-11 MED ORDER — POVIDONE-IODINE 10 % EX SOLN
Freq: Two times a day (BID) | CUTANEOUS | Status: DC
  Administered 2018-02-11 – 2018-02-13 (×5): via TOPICAL
  Filled 2018-02-11: qty 118

## 2018-02-11 NOTE — Progress Notes (Signed)
  Echocardiogram 2D Echocardiogram has been performed.  Meredith Gonzalez 02/11/2018, 11:27 AM

## 2018-02-11 NOTE — Consult Note (Signed)
[facility] Nurse wound consult note Reason for Consult:right great toe blood blister, right pretibial full thickness wound Wound type:venous insufficiency, edema EF 40% Pressure Injury POA: NA Measurement:right pretibial wound 1.5cm x 1cm x 0.1cm 100% pink wound bed, no drainage or odor, skin surrounding wound is intact. Right great toe has a 0.4cm blood blister, intact with a 1.5cm partial thickness wound surrounding, pink bed, no drainage or odor. Patient states she wears slip on shoes that rubbed.  Wound bed:see above Drainage (amount, consistency, odor) see above Periwound:see above Dressing procedure/placement/frequency:I have provided nurses with orders for betadine BID to right great toe wound. Pretibial wound covered with foam to protect, pt states she picks at it. Patient is diabetic, cautioned her about disturbing wound beds. We will not follow, but will remain available to this patient, to nursing, and the medical and/or surgical teams.  Please re-consult if we need to assist further.  Fara Olden, RN-C, WTA-C, [facility] Associate [facility] Associate

## 2018-02-11 NOTE — Care Management Note (Signed)
Case Management Note  Patient Details  Name: Caley Ciaramitaro MRN: [id number] Date of Birth: April 10, 1935  Subjective/Objective:  CHF                 Action/Plan: Patient lives in a [place]; CZY:SAYTK, Cecille Rubin, MD; has private insurance with Medicare; pharmacy of choice is [facility], television[facility]film set; DME - [employer] at home; she continues to cook and does not use sodium; has scales and knows to weigh herself daily. Patient could benefit from [facility] / Disease Management program; [place] choice offered. Pt chose [facility]; Dan with [facility] called for arrangements; CM will continue to follow for progression of care.  Expected Discharge Date:     Possibly 02/15/2018            Expected Discharge Plan:  [place]  Discharge planning Services  CM Consult  Choice offered to:  Patient  HH Arranged:  RN, Disease Management, PT [place] Agency:  [place]  Status of Service:  In process, will continue to follow  Sherrilyn Rist [phone] 02/11/2018, 2:35 PM

## 2018-02-11 NOTE — Progress Notes (Addendum)
Progress Note  Patient Name: Meredith Gonzalez Date of Encounter: 02/11/2018  Primary Cardiologist: Dorris Carnes, MD   Subjective   She reports improvement in her swelling and cough. Denies chest pain or SOB.  Inpatient Medications    Scheduled Meds: . amitriptyline  25 mg Oral QHS  . ammonium lactate  1 application Topical BID  . apixaban  5 mg Oral BID  . atorvastatin  40 mg Oral Daily  . calcium-vitamin D  1 tablet Oral BID WC  . ferrous sulfate  325 mg Oral BID WC  . FLUoxetine  60 mg Oral Daily  . furosemide  40 mg Intravenous BID  . gabapentin  300 mg Oral Daily  . gabapentin  600 mg Oral QHS  . losartan  25 mg Oral Daily  . Melatonin  9 mg Oral QHS  . metoprolol tartrate  25 mg Oral Q6H  . multivitamin with minerals  1 tablet Oral Daily  . pantoprazole  40 mg Oral Daily  . potassium chloride SA  20 mEq Oral Once per day on Mon Wed Fri  . povidone-iodine   Topical BID  . pramipexole  0.25 mg Oral q morning - 10a  . sodium chloride flush  3 mL Intravenous Q12H  . vitamin B-12  1,000 mcg Oral Daily   Continuous Infusions: . sodium chloride     PRN Meds: sodium chloride, acetaminophen, albuterol, diphenhydrAMINE, fluticasone, guaiFENesin-dextromethorphan, nitroGLYCERIN, ondansetron (ZOFRAN) IV, sodium chloride flush   Vital Signs    Vitals:   02/11/18 0112 02/11/18 0250 02/11/18 0531 02/11/18 0810  BP: 96/64  110/80 117/66  Pulse: 85  72 81  Resp: 16  20 16   Temp: 98.2 F (36.8 C)  (!) 97.3 F (36.3 C) 98 F (36.7 C)  TempSrc: Oral  Oral Oral  SpO2: 100%  98% 96%  Weight:  67 kg    Height:        Intake/Output Summary (Last 24 hours) at 02/11/2018 1147 Last data filed at 02/11/2018 1018 Gross per 24 hour  Intake 1083 ml  Output 1450 ml  Net -367 ml   Filed Weights   02/10/18 1305 02/11/18 0250  Weight: 68.9 kg 67 kg    Telemetry    Atrial fibrillation with rates improved to the 80s this morning - Personally Reviewed  Physical Exam   GEN:  Sitting upright in bed eating lunch in no acute distress.   Neck: No JVD, no carotid bruits Cardiac:  IRRR, no murmurs, rubs, or gallops.  Respiratory: crackles at lung bases with scattered wheezing, no rhonchi GI: NABS, Soft, nontender, non-distended  MS: Trace edema; No deformity. Neuro:  Nonfocal, moving all extremities spontaneously Psych: Normal affect   Labs    Chemistry Recent Labs  Lab 02/10/18 1519 02/11/18 0414  NA 138 138  K 3.7 3.7  CL 109 104  CO2 22 26  GLUCOSE 131* 104*  BUN 20 25*  CREATININE 0.69 1.09*  CALCIUM 8.6* 8.5*  PROT 6.2*  --   ALBUMIN 2.9*  --   AST 24  --   ALT 19  --   ALKPHOS 80  --   BILITOT 0.9  --   GFRNONAA >60 47*  GFRAA >60 54*  ANIONGAP 7 8     Hematology Recent Labs  Lab 02/10/18 1519 02/11/18 0414  WBC 6.1 6.1  RBC 3.06* 3.08*  3.08*  HGB 8.8* 8.8*  HCT 29.3* 29.8*  MCV 95.8 96.8  MCH 28.8 28.6  MCHC 30.0 29.5*  RDW 16.6* 16.8*  PLT 211 202    Cardiac Enzymes Recent Labs  Lab 02/10/18 1519  TROPONINI <0.03   No results for input(s): TROPIPOC in the last 168 hours.   BNP Recent Labs  Lab 02/10/18 1519  BNP 550.4*     DDimer No results for input(s): DDIMER in the last 168 hours.   Radiology    Dg Chest 2 View  Result Date: 02/11/2018 CLINICAL DATA:  Cough for 2 weeks. EXAM: CHEST - 2 VIEW COMPARISON:  02/02/2018 and prior radiograph FINDINGS: Cardiomegaly and LEFT pacemaker again identified in this low volume film. Peribronchial thickening/interstitial prominence again noted. There is no evidence of focal airspace disease, pulmonary edema, suspicious pulmonary nodule/mass, pleural effusion, or pneumothorax. No acute bony abnormalities are identified. IMPRESSION: No evidence of acute cardiopulmonary disease. Cardiomegaly with unchanged mild peribronchial thickening/interstitial prominence. Electronically Signed   By: Margarette Canada M.D.   On: 02/11/2018 09:08    Cardiac Studies   Echocardiogram  02/11/18: Study Conclusions  - Left ventricle: Systolic function was normal. The estimated   ejection fraction was in the range of 50% to 55%. - Aortic valve: Sclerosis without stenosis. - Mitral valve: There was mild regurgitation. - Left atrium: The atrium was moderately dilated. - Right ventricle: The cavity size was moderately dilated. - Right atrium: The atrium was moderately dilated. - Atrial septum: No defect or patent foramen ovale was identified. - Tricuspid valve: There was severe regurgitation. - Pulmonary arteries: PA peak pressure: 49 mm Hg (S). - Impressions: Concern that transvenous pacing wire is causing more   TR and elevated right heart pressures with worsening TR.  Impressions:  - Concern that transvenous pacing wire is causing more TR and   elevated right heart pressures with worsening TR.  Left heart cath 07/19/16:  Prox RCA to Mid RCA lesion, 20 %stenosed.  There is moderate left ventricular systolic dysfunction.  LV end diastolic pressure is mildly elevated.  The left ventricular ejection fraction is 35-45% by visual estimate.  1st Diag lesion, 95 %stenosed.  A STENT PROMUS PREM MR 2.5X16 drug eluting stent was successfully placed.  Post intervention, there is a 0% residual stenosis.  1. Single vessel occlusive CAD. 95% large diagonal branch 2. Moderate LV dysfunction. EF 35-40%. 3. Mildly elevated LVEDP 4. Successful stenting of the first diagonal with DES  Plan: DAPT for one year.  Patient Profile     82 y.o. female with PMH of CAD s/p PCI/DES to 1st diagonal 06/2016, paroxysmal atrial fibrillation (previously not on [facility] due to history of subdural hematoma), chronic systolic CHF (EF 56-21% on cath 06/2016)  Assessment & Plan    1. Acute on chronic combined CHF: She presented outpatient with complaints of weight gain, LE edema, cough, and orthopnea. Weight felt to be up ~13lbs (dry weight 140lbs) despite taking lasix 40mg  qAM and 20mg  qPM.  She was admitted to the hospital from the office 02/10/18 and started on IV lasix 40mg  BID. She has had essentially net nil UOP with +63L since admission, although her weight is down from 152lbs to 147.8lbs today. Unfortunately her Cr bumped to 1.09 today from 0.69 yesterday. Echo today showed EF 50-55%, moderate biatrial enlargement, and severe TR (mild on echo 10/2016) with concern for transvenous pacing wire causing worsening TR and elevated right heart pressures. Still with crackles on lung exam today - Will plan to give 80mg  IV lasix this afternoon and check a PM BMET - Additional lasix pending Cr trend - Will  hold home losartan given rising Cr and soft BP's - Continue to monitor strict I&Os and daily weights  2. Paroxysmal atrial fibrillation: felt that her atrial fibrillation is likely contributing to #1. She was in Afib RVR when seen outpatient yesterday and was transitioned from pindolol to metoprolol tartrate 25mg  q6hr for better rate control. She was previously not on [facility] given history of subdural hematoma but given negative CT Head 12/2017, decision made to start eliquis 5mg  BID with plans for possible TEE/DCCV this admission. Risks/benefits discussed with patient who is agreeable to proceeding.   Her rate is improved this morning. PPM interrogation showed Afib onset 01/12/18.  - Continue metoprolol for rate control - Continue apixaban for stroke ppx - Will tentatively plan for TEE/DCCV Friday.  3. HTN: BP on the soft side.  - Will hold home losartan to allow room in BP/Cr for diuresis - Continue metoprolol and lasix  4. CAD s/p PCI/DES to 1st diagonal 06/2016: no anginal complaints. ASA and plavix stopped given need for anticoagulation.  - Continue statin  5. SSS s/p PPM: c/f pacing wire causing worsening of TR.  - Will focus on diuresis/ rhythm control at this time  6. Anemia: Hgb stable at 8.8 today. Labs c/w iron deficiency. On po iron. No reports of bleeding since starting [facility]  yesterday. - Continue po iron for now.   For questions or updates, please contact [place] Please consult [email or web address] for contact info under Cardiology/STEMI.      Signed, Abigail Butts, PA-C  02/11/2018, 11:47 AM   [phone]  Pt seen and examined   I know her from clinic   Admitted yesterday with SOB and edema Has diuresed some with IV lasix   Echo:   LVEF normal   RV normal but RV and IVC are larger  This is different from echo in 2018  There is TR that is signif, probably from pacemaker wire   Was present in 2018 as well On interrogation of device , patient has been in atrial fib since October 2017  Recomm: Give addional IV lasix   Pt is improving Plan for TEE/ Cardioversion once volume improved   Probably Friday.  Dorris Carnes

## 2018-02-11 NOTE — Telephone Encounter (Signed)
Spoke with patient's son. Aware Dr. Harrington Challenger will try to come back to patient's room tonight.

## 2018-02-11 NOTE — Telephone Encounter (Signed)
New Message   Patients son Meredith Gonzalez is calling because he has some additional questions in reference to his mother. Dr. Harrington Challenger saw the patient in the hospital today and advised his mother that if he had any questions to call her. Please call to discuss.

## 2018-02-12 LAB — CBC
HEMATOCRIT: 30.6 % — AB (ref 36.0–46.0)
Hemoglobin: 8.7 g/dL — ABNORMAL LOW (ref 12.0–15.0)
MCH: 27.6 pg (ref 26.0–34.0)
MCHC: 28.4 g/dL — ABNORMAL LOW (ref 30.0–36.0)
MCV: 97.1 fL (ref 80.0–100.0)
Platelets: 206 10*3/uL (ref 150–400)
RBC: 3.15 MIL/uL — ABNORMAL LOW (ref 3.87–5.11)
RDW: 16.6 % — AB (ref 11.5–15.5)
WBC: 6 10*3/uL (ref 4.0–10.5)
nRBC: 0 % (ref 0.0–0.2)

## 2018-02-12 LAB — BASIC METABOLIC PANEL
Anion gap: 8 (ref 5–15)
BUN: 26 mg/dL — AB (ref 8–23)
CHLORIDE: 102 mmol/L (ref 98–111)
CO2: 28 mmol/L (ref 22–32)
CREATININE: 1.1 mg/dL — AB (ref 0.44–1.00)
Calcium: 8.5 mg/dL — ABNORMAL LOW (ref 8.9–10.3)
GFR calc Af Amer: 53 mL/min — ABNORMAL LOW (ref >60)
GFR calc non Af Amer: 46 mL/min — ABNORMAL LOW (ref >60)
Glucose, Bld: 145 mg/dL — ABNORMAL HIGH (ref 70–99)
Potassium: 3.4 mmol/L — ABNORMAL LOW (ref 3.5–5.1)
SODIUM: 138 mmol/L (ref 135–145)

## 2018-02-12 MED ORDER — CALCIUM CARBONATE-VITAMIN D 500-200 MG-UNIT PO TABS
1.0000 | ORAL_TABLET | Freq: Two times a day (BID) | ORAL | Status: DC
  Administered 2018-02-12 – 2018-02-14 (×5): 1 via ORAL
  Filled 2018-02-12 (×5): qty 1

## 2018-02-12 MED ORDER — FERROUS SULFATE 325 (65 FE) MG PO TABS
325.0000 mg | ORAL_TABLET | Freq: Two times a day (BID) | ORAL | Status: DC
  Administered 2018-02-12 – 2018-02-14 (×5): 325 mg via ORAL
  Filled 2018-02-12 (×5): qty 1

## 2018-02-12 MED ORDER — POTASSIUM CHLORIDE CRYS ER 20 MEQ PO TBCR
60.0000 meq | EXTENDED_RELEASE_TABLET | Freq: Once | ORAL | Status: AC
  Administered 2018-02-12: 60 meq via ORAL
  Filled 2018-02-12: qty 3

## 2018-02-12 MED ORDER — FUROSEMIDE 10 MG/ML IJ SOLN
80.0000 mg | Freq: Once | INTRAMUSCULAR | Status: AC
  Administered 2018-02-12: 80 mg via INTRAVENOUS
  Filled 2018-02-12: qty 8

## 2018-02-12 NOTE — Progress Notes (Addendum)
Progress Note  Patient Name: Meredith Gonzalez Date of Encounter: 02/12/2018  Primary Cardiologist: Dorris Carnes, MD   Subjective   Continues to improve. Was able to ambulate down the hall and back - felt tired but breathing was okay. Swelling continues to decrease as well. Denies chest pain. States her dry weight is ~135lbs. She is hopeful to go home over the weekend as she would like to be at home on Monday to morn the anniversary of her husbands death.   Inpatient Medications    Scheduled Meds: . amitriptyline  25 mg Oral QHS  . ammonium lactate  1 application Topical BID  . apixaban  5 mg Oral BID  . atorvastatin  40 mg Oral Daily  . calcium-vitamin D  1 tablet Oral BID WC  . ferrous sulfate  325 mg Oral BID WC  . FLUoxetine  60 mg Oral Daily  . gabapentin  300 mg Oral Daily  . gabapentin  600 mg Oral QHS  . Melatonin  9 mg Oral QHS  . metoprolol tartrate  25 mg Oral Q6H  . multivitamin with minerals  1 tablet Oral Daily  . pantoprazole  40 mg Oral Daily  . potassium chloride SA  20 mEq Oral Once per day on Mon Wed Fri  . potassium chloride  60 mEq Oral Once  . povidone-iodine   Topical BID  . pramipexole  0.25 mg Oral q morning - 10a  . sodium chloride flush  3 mL Intravenous Q12H  . vitamin B-12  1,000 mcg Oral Daily   Continuous Infusions: . sodium chloride     PRN Meds: sodium chloride, acetaminophen, albuterol, diphenhydrAMINE, fluticasone, guaiFENesin-dextromethorphan, nitroGLYCERIN, ondansetron (ZOFRAN) IV, sodium chloride flush   Vital Signs    Vitals:   02/11/18 1943 02/11/18 2347 02/12/18 0214 02/12/18 0430  BP: 121/81 106/67 122/68 107/85  Pulse: 81 73 78 75  Resp: 18 18  18   Temp: 98.5 F (36.9 C) 98.3 F (36.8 C)  98.3 F (36.8 C)  TempSrc: Oral Oral  Oral  SpO2: 97% 100%  96%  Weight:    65.2 kg  Height:        Intake/Output Summary (Last 24 hours) at 02/12/2018 1212 Last data filed at 02/12/2018 0432 Gross per 24 hour  Intake 480 ml    Output 3200 ml  Net -2720 ml   Filed Weights   02/10/18 1305 02/11/18 0250 02/12/18 0430  Weight: 68.9 kg 67 kg 65.2 kg    Telemetry    Atrial fibrillation with CVR - Personally Reviewed   Physical Exam   GEN: Laying in bed in no acute distress.   Neck: No JVD, no carotid bruits Cardiac: irregular rate, regular rhythm, no murmurs, rubs, or gallops.  Respiratory: crackles at lung bases; no rhonchi or wheezing.  GI: NABS, Soft, nontender, non-distended  MS: legs still taught but edema is improved; No deformity. Neuro:  Nonfocal, moving all extremities spontaneously Psych: Normal affect   Labs    Chemistry Recent Labs  Lab 02/10/18 1519 02/11/18 0414 02/11/18 2148 02/12/18 0515  NA 138 138 138 138  K 3.7 3.7 4.1 3.4*  CL 109 104 102 102  CO2 22 26 28 28   GLUCOSE 131* 104* 241* 145*  BUN 20 25* 28* 26*  CREATININE 0.69 1.09* 1.12* 1.10*  CALCIUM 8.6* 8.5* 8.9 8.5*  PROT 6.2*  --   --   --   ALBUMIN 2.9*  --   --   --   AST  24  --   --   --   ALT 19  --   --   --   ALKPHOS 80  --   --   --   BILITOT 0.9  --   --   --   GFRNONAA >60 47* 45* 46*  GFRAA >60 54* 52* 53*  ANIONGAP 7 8 8 8      Hematology Recent Labs  Lab 02/10/18 1519 02/11/18 0414 02/12/18 0515  WBC 6.1 6.1 6.0  RBC 3.06* 3.08*  3.08* 3.15*  HGB 8.8* 8.8* 8.7*  HCT 29.3* 29.8* 30.6*  MCV 95.8 96.8 97.1  MCH 28.8 28.6 27.6  MCHC 30.0 29.5* 28.4*  RDW 16.6* 16.8* 16.6*  PLT 211 202 206    Cardiac Enzymes Recent Labs  Lab 02/10/18 1519  TROPONINI <0.03   No results for input(s): TROPIPOC in the last 168 hours.   BNP Recent Labs  Lab 02/10/18 1519  BNP 550.4*     DDimer No results for input(s): DDIMER in the last 168 hours.   Radiology    Dg Chest 2 View  Result Date: 02/11/2018 CLINICAL DATA:  Cough for 2 weeks. EXAM: CHEST - 2 VIEW COMPARISON:  02/02/2018 and prior radiograph FINDINGS: Cardiomegaly and LEFT pacemaker again identified in this low volume film.  Peribronchial thickening/interstitial prominence again noted. There is no evidence of focal airspace disease, pulmonary edema, suspicious pulmonary nodule/mass, pleural effusion, or pneumothorax. No acute bony abnormalities are identified. IMPRESSION: No evidence of acute cardiopulmonary disease. Cardiomegaly with unchanged mild peribronchial thickening/interstitial prominence. Electronically Signed   By: Margarette Canada M.D.   On: 02/11/2018 09:08    Cardiac Studies   Echocardiogram 02/11/18: Study Conclusions  - Left ventricle: Systolic function was normal. The estimated ejection fraction was in the range of 50% to 55%. - Aortic valve: Sclerosis without stenosis. - Mitral valve: There was mild regurgitation. - Left atrium: The atrium was moderately dilated. - Right ventricle: The cavity size was moderately dilated. - Right atrium: The atrium was moderately dilated. - Atrial septum: No defect or patent foramen ovale was identified. - Tricuspid valve: There was severe regurgitation. - Pulmonary arteries: PA peak pressure: 49 mm Hg (S). - Impressions: Concern that transvenous pacing wire is causing more TR and elevated right heart pressures with worsening TR.  Impressions:  - Concern that transvenous pacing wire is causing more TR and elevated right heart pressures with worsening TR.  Left heart cath 07/19/16:  Prox RCA to Mid RCA lesion, 20 %stenosed.  There is moderate left ventricular systolic dysfunction.  LV end diastolic pressure is mildly elevated.  The left ventricular ejection fraction is 35-45% by visual estimate.  1st Diag lesion, 95 %stenosed.  A STENT PROMUS PREM MR 2.5X16 drug eluting stent was successfully placed.  Post intervention, there is a 0% residual stenosis.  1. Single vessel occlusive CAD. 95% large diagonal branch 2. Moderate LV dysfunction. EF 35-40%. 3. Mildly elevated LVEDP 4. Successful stenting of the first diagonal with DES  Plan:  DAPT for one year.  Patient Profile     82 y.o. female with PMH of CAD s/p PCI/DES to 1st diagonal 06/2016, paroxysmal atrial fibrillation (previously not on [facility] due to history of subdural hematoma), chronic systolic CHF (EF 46-56% on cath 06/2016). Admitted for acute on chronic CHF and atrial fibrillation  Assessment & Plan    1. Acute on chronic combined CHF: Cr stable at 1.1 today (Baseline ~0.7). Diuresing with IV lasix. UOP with net -3.1L  this admission. Weight continues to decrease - down to 143lbs today from 151lbs on admission. Still with some crackles on lung exam today.  - Will give additional lasix 80mg  IV today - Continue to dose lasix based on exam/Cr - Continue to monitor strict I&Os and daily weights.  - Will replete K today to maintain >4.   2. Paroxysmal atrial fibrillation: per PPM interrogation, patient has been in atrial fibrillation since 01/12/18. Likely contributing to #1. Anticoagulation initiated 02/10/18 after resolution of subdural hematoma.  - Plan for TEE/DCCV tomorrow - NPO after MN - Continue apixaban 5mg  BID for stroke ppx - Continue metoprolol for rate control  3. HTN: BP stable. Home losartan on hold given AKI - Continue metoprolol and lasix  4. CAD s/p PCI/DES to 1st diagonal 06/2016: ASA and plavix discontinued with start of apixaban. No anginal complaints - Continue statin  5. SSS s/p PPM: echo with concerns for pacing wire causing worsening of TR  - Will focus on diuresis/rhythm control at this time  6. Anemia: Hgb stable at 8.7 today - Continue po iron  For questions or updates, please contact [place] Please consult [email or web address] for contact info under Cardiology/STEMI.      Signed, Abigail Butts, PA-C  02/12/2018, 12:12 PM   [phone]  Pt seen and examined   I agree with findings as noted by K Kroeger above Pt appears more comfortable  ON exam:   Lungs are CTA    Cardiac Irreg irreg   No S3  Ext are without edemia  I  would hold on IV lasix      Plan for TEE/ DCCV tomorrow  Goal to get back in SR which may help with fluid management     Dorris Carnes

## 2018-02-13 ENCOUNTER — Telehealth: Payer: Self-pay | Admitting: Internal Medicine

## 2018-02-13 ENCOUNTER — Inpatient Hospital Stay (HOSPITAL_COMMUNITY): Payer: Medicare Other | Admitting: Anesthesiology

## 2018-02-13 ENCOUNTER — Other Ambulatory Visit (HOSPITAL_COMMUNITY): Payer: Medicare Other

## 2018-02-13 ENCOUNTER — Inpatient Hospital Stay (HOSPITAL_COMMUNITY): Payer: Medicare Other

## 2018-02-13 ENCOUNTER — Encounter (HOSPITAL_COMMUNITY)
Admission: AD | Disposition: A | Discharge status: Home Health | Payer: Self-pay | Source: Ambulatory Visit | Attending: Cardiology

## 2018-02-13 DIAGNOSIS — I4891 Unspecified atrial fibrillation: Secondary | ICD-10-CM

## 2018-02-13 DIAGNOSIS — I340 Nonrheumatic mitral (valve) insufficiency: Secondary | ICD-10-CM

## 2018-02-13 HISTORY — PX: CARDIOVERSION: SHX1299

## 2018-02-13 HISTORY — PX: TEE WITHOUT CARDIOVERSION: SHX5443

## 2018-02-13 LAB — BASIC METABOLIC PANEL
Anion gap: 6 (ref 5–15)
BUN: 30 mg/dL — AB (ref 8–23)
CO2: 31 mmol/L (ref 22–32)
CREATININE: 1.07 mg/dL — AB (ref 0.44–1.00)
Calcium: 8.6 mg/dL — ABNORMAL LOW (ref 8.9–10.3)
Chloride: 104 mmol/L (ref 98–111)
GFR calc Af Amer: 55 mL/min — ABNORMAL LOW (ref >60)
GFR calc non Af Amer: 48 mL/min — ABNORMAL LOW (ref >60)
Glucose, Bld: 106 mg/dL — ABNORMAL HIGH (ref 70–99)
POTASSIUM: 3.9 mmol/L (ref 3.5–5.1)
SODIUM: 141 mmol/L (ref 135–145)

## 2018-02-13 LAB — CBC
HEMATOCRIT: 30.4 % — AB (ref 36.0–46.0)
HEMOGLOBIN: 8.9 g/dL — AB (ref 12.0–15.0)
MCH: 28.3 pg (ref 26.0–34.0)
MCHC: 29.3 g/dL — AB (ref 30.0–36.0)
MCV: 96.8 fL (ref 80.0–100.0)
NRBC: 0 % (ref 0.0–0.2)
Platelets: 207 10*3/uL (ref 150–400)
RBC: 3.14 MIL/uL — ABNORMAL LOW (ref 3.87–5.11)
RDW: 16.4 % — AB (ref 11.5–15.5)
WBC: 6.5 10*3/uL (ref 4.0–10.5)

## 2018-02-13 SURGERY — ECHOCARDIOGRAM, TRANSESOPHAGEAL
Anesthesia: Monitor Anesthesia Care

## 2018-02-13 MED ORDER — PROPOFOL 500 MG/50ML IV EMUL
INTRAVENOUS | Status: DC | PRN
  Administered 2018-02-13: 50 ug/kg/min via INTRAVENOUS

## 2018-02-13 MED ORDER — FUROSEMIDE 40 MG PO TABS
40.0000 mg | ORAL_TABLET | Freq: Every day | ORAL | Status: DC
  Administered 2018-02-13: 40 mg via ORAL
  Filled 2018-02-13 (×2): qty 1

## 2018-02-13 MED ORDER — PROPOFOL 10 MG/ML IV BOLUS
INTRAVENOUS | Status: DC | PRN
  Administered 2018-02-13: 10 mg via INTRAVENOUS
  Administered 2018-02-13: 50 mg via INTRAVENOUS

## 2018-02-13 MED ORDER — SODIUM CHLORIDE 0.9% FLUSH: 3.0000 mL | INTRAVENOUS | Status: DC | PRN

## 2018-02-13 MED ORDER — FUROSEMIDE 40 MG PO TABS: 40.0000 mg | ORAL_TABLET | Freq: Two times a day (BID) | ORAL | Status: DC

## 2018-02-13 MED ORDER — SODIUM CHLORIDE 0.9 % IV SOLN: 250.0000 mL | INTRAVENOUS | Status: DC

## 2018-02-13 MED ORDER — SODIUM CHLORIDE 0.9 % IV SOLN
INTRAVENOUS | Status: DC
  Administered 2018-02-13: 02:00:00 via INTRAVENOUS

## 2018-02-13 MED ORDER — SODIUM CHLORIDE 0.9% FLUSH
3.0000 mL | Freq: Two times a day (BID) | INTRAVENOUS | Status: DC
  Administered 2018-02-13: 3 mL via INTRAVENOUS

## 2018-02-13 MED ORDER — POTASSIUM CHLORIDE CRYS ER 10 MEQ PO TBCR
10.0000 meq | EXTENDED_RELEASE_TABLET | Freq: Every day | ORAL | Status: DC
  Administered 2018-02-14: 10 meq via ORAL
  Filled 2018-02-13: qty 1

## 2018-02-13 MED ORDER — LIDOCAINE 2% (20 MG/ML) 5 ML SYRINGE
INTRAMUSCULAR | Status: DC | PRN
  Administered 2018-02-13: 100 mg via INTRAVENOUS

## 2018-02-13 NOTE — CV Procedure (Signed)
    PROCEDURE NOTE:  Procedure:  Transesophageal echocardiogram Operator:  Fransico Him, MD Indications:  Atrial fibrillation Complications: None  During this procedure the patient is administered a total of Propofol 110 mg and Lidocaine 100mg  to achieve and maintain moderate conscious sedation.  The patient's heart rate, blood pressure, and oxygen saturation are monitored continuously during the procedure.  Results: Normal LV size and low normal LV function with EF 50-55% Moderately dilated RV and normal RV function Moderately dilated RA Moderately dilated LA with mild spontaneous echo contrast. Normal LA appendage with no evidence of thrombus.  Normal TV with moderate TR Normal PV with mild PR Normal MV with trivial MR Normal trileaflet AV Lipomatous interatrial septum with no evidence of shunt by colorflow dopper  Moderate atherosclerosis of the thoracic and ascending aorta.  The patient tolerated the procedure well and was transferred back to their room in stable condition.  Signed: Fransico Him, MD [facility]    Electrical Cardioversion Procedure Note Mily Malecki [id number] March 03, 1936  Procedure: Electrical Cardioversion Indications:  Atrial Fibrillation  Time Out: Verified patient identification, verified procedure,medications/allergies/relevent history reviewed, required imaging and test results available.  Performed  Procedure Details  Cardioversion was done with synchronized biphasic defibrillation with AP pads with 150watts.  The patient converted to normal sinus rhythm with Vpacing and frequent PACs and PVCs. . The patient tolerated the procedure well   IMPRESSION:  Successful cardioversion of atrial fibrillation    Archibald Marchetta 02/13/2018, 12:41 PM

## 2018-02-13 NOTE — Discharge Instructions (Addendum)
Do not take aspirin and plavix anymore. You will start apixaban 5mg  two times per day for your atrial fibrillation to prevent strokes. This medication is a blood thinner. Please monitor for signs of bleeding in your stool or urine. If you fall and hit your head, you should present to the emergency room to ensure you do not have any bleeding in your brain.    Information on my medicine - ELIQUIS (apixaban)  Why was Eliquis prescribed for you? Eliquis was prescribed for you to reduce the risk of a blood clot forming that can cause a stroke if you have a medical condition called atrial fibrillation (a type of irregular heartbeat).  What do You need to know about Eliquis ? Take your Eliquis TWICE DAILY - one tablet in the morning and one tablet in the evening with or without food. If you have difficulty swallowing the tablet whole please discuss with your pharmacist how to take the medication safely.  Take Eliquis exactly as prescribed by your doctor and DO NOT stop taking Eliquis without talking to the doctor who prescribed the medication.  Stopping may increase your risk of developing a stroke.  Refill your prescription before you run out.  After discharge, you should have regular check-up appointments with your healthcare provider that is prescribing your Eliquis.  In the future your dose may need to be changed if your kidney function or weight changes by a significant amount or as you get older.  What do you do if you miss a dose? If you miss a dose, take it as soon as you remember on the same day and resume taking twice daily.  Do not take more than one dose of ELIQUIS at the same time to make up a missed dose.  Important Safety Information A possible side effect of Eliquis is bleeding. You should call your healthcare provider right away if you experience any of the following: ? Bleeding from an injury or your nose that does not stop. ? Unusual colored urine (red or dark brown) or  unusual colored stools (red or black). ? Unusual bruising for unknown reasons. ? A serious fall or if you hit your head (even if there is no bleeding).  Some medicines may interact with Eliquis and might increase your risk of bleeding or clotting while on Eliquis. To help avoid this, consult your healthcare provider or pharmacist prior to using any new prescription or non-prescription medications, including herbals, vitamins, non-steroidal anti-inflammatory drugs (NSAIDs) and supplements.  This website has more information on Eliquis (apixaban): http://www.eliquis.com/eliquis/home

## 2018-02-13 NOTE — Anesthesia Preprocedure Evaluation (Signed)
Anesthesia Evaluation  Patient identified by MRN, date of birth, ID band Patient awake    Reviewed: Allergy & Precautions, NPO status , Patient's Chart, lab work & pertinent test results  Airway Mallampati: I  TM Distance: >3 FB Neck ROM: Full    Dental  (+) Upper Dentures, Lower Dentures, Dental Advisory Given   Pulmonary former smoker,    Pulmonary exam normal        Cardiovascular hypertension, + CAD, + Past MI and +CHF  + pacemaker  Rhythm:Irregular     Neuro/Psych PSYCHIATRIC DISORDERS Anxiety Depression CVA    GI/Hepatic negative GI ROS, Neg liver ROS,   Endo/Other  diabetesHypothyroidism   Renal/GU negative Renal ROS  negative genitourinary   Musculoskeletal  (+) Arthritis , Fibromyalgia -  Abdominal   Peds negative pediatric ROS (+)  Hematology negative hematology ROS (+)   Anesthesia Other Findings   Reproductive/Obstetrics negative OB ROS                             Anesthesia Physical Anesthesia Plan  ASA: III  Anesthesia Plan: MAC   Post-op Pain Management:    Induction:   PONV Risk Score and Plan: Ondansetron and Propofol infusion  Airway Management Planned: Natural Airway  Additional Equipment:   Intra-op Plan:   Post-operative Plan:   Informed Consent: I have reviewed the patients History and Physical, chart, labs and discussed the procedure including the risks, benefits and alternatives for the proposed anesthesia with the patient or authorized representative who has indicated his/her understanding and acceptance.   Dental advisory given  Plan Discussed with: Anesthesiologist and CRNA  Anesthesia Plan Comments:         Anesthesia Quick Evaluation

## 2018-02-13 NOTE — Progress Notes (Signed)
  Echocardiogram Echocardiogram Transesophageal has been performed.  Meredith Gonzalez 02/13/2018, 2:46 PM

## 2018-02-13 NOTE — Interval H&P Note (Signed)
History and Physical Interval Note:  02/13/2018 12:40 PM  Meredith Gonzalez  has presented today for surgery, with the diagnosis of afib  The various methods of treatment have been discussed with the patient and family. After consideration of risks, benefits and other options for treatment, the patient has consented to  Procedure(s): TRANSESOPHAGEAL ECHOCARDIOGRAM (TEE) (N/A) CARDIOVERSION (N/A) as a surgical intervention .  The patient's history has been reviewed, patient examined, no change in status, stable for surgery.  I have reviewed the patient's chart and labs.  Questions were answered to the patient's satisfaction.     Fransico Him

## 2018-02-13 NOTE — Plan of Care (Signed)
  Problem: Education: Goal: Knowledge of General Education information will improve Description: Including pain rating scale, medication(s)/side effects and non-pharmacologic comfort measures Outcome: Progressing   Problem: Clinical Measurements: Goal: Respiratory complications will improve Outcome: Progressing   Problem: Activity: Goal: Risk for activity intolerance will decrease Outcome: Progressing   

## 2018-02-13 NOTE — Telephone Encounter (Signed)
The pt has not been discharged from the hospital. Will call on Monday.

## 2018-02-13 NOTE — Progress Notes (Signed)
Progress Note  Patient Name: Meredith Gonzalez Date of Encounter: 02/13/2018  Primary Cardiologist: Dorris Carnes, MD   Subjective   Breathing is OK   Legs much better   No CP    Inpatient Medications    Scheduled Meds: . amitriptyline  25 mg Oral QHS  . ammonium lactate  1 application Topical BID  . apixaban  5 mg Oral BID  . atorvastatin  40 mg Oral Daily  . calcium-vitamin D  1 tablet Oral BID WC  . ferrous sulfate  325 mg Oral BID WC  . FLUoxetine  60 mg Oral Daily  . gabapentin  300 mg Oral Daily  . gabapentin  600 mg Oral QHS  . Melatonin  9 mg Oral QHS  . metoprolol tartrate  25 mg Oral Q6H  . multivitamin with minerals  1 tablet Oral Daily  . pantoprazole  40 mg Oral Daily  . potassium chloride SA  20 mEq Oral Once per day on Mon Wed Fri  . povidone-iodine   Topical BID  . pramipexole  0.25 mg Oral q morning - 10a  . sodium chloride flush  3 mL Intravenous Q12H  . sodium chloride flush  3 mL Intravenous Q12H  . vitamin B-12  1,000 mcg Oral Daily   Continuous Infusions: . sodium chloride    . sodium chloride 20 mL/hr at 02/13/18 0700  . sodium chloride     PRN Meds: sodium chloride, acetaminophen, albuterol, diphenhydrAMINE, fluticasone, guaiFENesin-dextromethorphan, nitroGLYCERIN, ondansetron (ZOFRAN) IV, sodium chloride flush, sodium chloride flush   Vital Signs    Vitals:   02/12/18 1932 02/12/18 1955 02/13/18 0154 02/13/18 0417  BP: 120/79  107/82 107/65  Pulse: (!) 127 85 73 71  Resp: 18   18  Temp: 97.7 F (36.5 C)   98.2 F (36.8 C)  TempSrc: Oral   Oral  SpO2: 99%   96%  Weight:    64.5 kg  Height:        Intake/Output Summary (Last 24 hours) at 02/13/2018 1127 Last data filed at 02/13/2018 0929 Gross per 24 hour  Intake 760.85 ml  Output 2700 ml  Net -1939.15 ml   Filed Weights   02/11/18 0250 02/12/18 0430 02/13/18 0417  Weight: 67 kg 65.2 kg 64.5 kg    Telemetry    Atrial fibrillation with rates improved to the 80s this morning -  Personally Reviewed  Physical Exam   GEN: Sitting upright in bed eating lunch in no acute distress.   Neck: JVP is normal    Cardiac:  IRRR, no murmurs, rubs, or gallops.  Respiratory: CTA GI: NABS, Soft, nontender, non-distended  MS: No  edema; No deformity. Neuro:  Nonfocal, moving all extremities spontaneously Psych: Normal affect   Labs    Chemistry Recent Labs  Lab 02/10/18 1519  02/11/18 2148 02/12/18 0515 02/13/18 0458  NA 138   < > 138 138 141  K 3.7   < > 4.1 3.4* 3.9  CL 109   < > 102 102 104  CO2 22   < > 28 28 31   GLUCOSE 131*   < > 241* 145* 106*  BUN 20   < > 28* 26* 30*  CREATININE 0.69   < > 1.12* 1.10* 1.07*  CALCIUM 8.6*   < > 8.9 8.5* 8.6*  PROT 6.2*  --   --   --   --   ALBUMIN 2.9*  --   --   --   --  AST 24  --   --   --   --   ALT 19  --   --   --   --   ALKPHOS 80  --   --   --   --   BILITOT 0.9  --   --   --   --   GFRNONAA >60   < > 45* 46* 48*  GFRAA >60   < > 52* 53* 55*  ANIONGAP 7   < > 8 8 6    < > = values in this interval not displayed.     Hematology Recent Labs  Lab 02/11/18 0414 02/12/18 0515 02/13/18 0458  WBC 6.1 6.0 6.5  RBC 3.08*  3.08* 3.15* 3.14*  HGB 8.8* 8.7* 8.9*  HCT 29.8* 30.6* 30.4*  MCV 96.8 97.1 96.8  MCH 28.6 27.6 28.3  MCHC 29.5* 28.4* 29.3*  RDW 16.8* 16.6* 16.4*  PLT 202 206 207    Cardiac Enzymes Recent Labs  Lab 02/10/18 1519  TROPONINI <0.03   No results for input(s): TROPIPOC in the last 168 hours.   BNP Recent Labs  Lab 02/10/18 1519  BNP 550.4*     DDimer No results for input(s): DDIMER in the last 168 hours.   Radiology    No results found.  Cardiac Studies   Echocardiogram 02/11/18: Study Conclusions  - Left ventricle: Systolic function was normal. The estimated   ejection fraction was in the range of 50% to 55%. - Aortic valve: Sclerosis without stenosis. - Mitral valve: There was mild regurgitation. - Left atrium: The atrium was moderately dilated. - Right  ventricle: The cavity size was moderately dilated. - Right atrium: The atrium was moderately dilated. - Atrial septum: No defect or patent foramen ovale was identified. - Tricuspid valve: There was severe regurgitation. - Pulmonary arteries: PA peak pressure: 49 mm Hg (S). - Impressions: Concern that transvenous pacing wire is causing more   TR and elevated right heart pressures with worsening TR.  Impressions:  - Concern that transvenous pacing wire is causing more TR and   elevated right heart pressures with worsening TR.  Left heart cath 07/19/16:  Prox RCA to Mid RCA lesion, 20 %stenosed.  There is moderate left ventricular systolic dysfunction.  LV end diastolic pressure is mildly elevated.  The left ventricular ejection fraction is 35-45% by visual estimate.  1st Diag lesion, 95 %stenosed.  A STENT PROMUS PREM MR 2.5X16 drug eluting stent was successfully placed.  Post intervention, there is a 0% residual stenosis.  1. Single vessel occlusive CAD. 95% large diagonal branch 2. Moderate LV dysfunction. EF 35-40%. 3. Mildly elevated LVEDP 4. Successful stenting of the first diagonal with DES  Plan: DAPT for one year.  Patient Profile     82 y.o. female with PMH of CAD s/p PCI/DES to 1st diagonal 06/2016, paroxysmal atrial fibrillation (previously not on [facility] due to history of subdural hematoma), chronic systolic CHF (EF 93-79% on cath 06/2016)  Assessment & Plan    1. Acute on chronic diastolic CHF: She presented with weight gain, LE edema, cough, and orthopnea. Weight up ~13lbs (dry weight 140lbs) despite taking lasix 40mg  qAM and 20mg  qPM  No change in diet   Interrogation of pacemaker shows she went into atrial fibrillation on 10/21     She has diuresed since admit   Volume looks good I would recomm TEE  Cardioversion to get back into SR    2. Paroxysmal atrial fibrillation: felt that  her atrial fibrillation is likely contributing to #1.  She had not beein on  anticoag because of history fall (remote) with subdural hematoma   CT negative   Now on Eliquis Plan for TEE / Cardioversion  3. HTN: BP is OK    4. CAD s/p PCI/DES to 1st diagonal 06/2016: no anginal complaints. ASA and plavix stopped given need for anticoagulation.  - Continue statin  5. SSS s/p PPM: c/f pacing wire causing worsening of TR.  - Will focus on diuresis/ rhythm control at this time   Follow response and follow as outpt    6. Anemia: Hgb stable at 8.9 today. Labs c/w iron deficiency. On po iron.   Follow   For questions or updates, please contact [place] Please consult [email or web address] for contact info under Cardiology/STEMI.      Signed, Dorris Carnes, MD  02/13/2018, 11:27 AM   [phone]

## 2018-02-13 NOTE — Telephone Encounter (Signed)
New message:       TOC on 03/02/18 @ 8:45 with Kathlen Mody per Daleen Snook

## 2018-02-13 NOTE — Care Management Important Message (Signed)
Important Message  Patient Details  Name: Meredith Gonzalez MRN: [id number] Date of Birth: [birth year]   Medicare Important Message Given:  Yes    Cynthia Stainback [address]ine Circle 02/13/2018, 2:49 PM

## 2018-02-13 NOTE — Progress Notes (Signed)
Patient cardioverted to SR   She will have pacer interrogated by [employer] representative   Prelim by Olin Pia, to undergo some modifcation of ventricular pacing.  From cardiac standpoint OK to go home tomorrow  Ambulate tonight Was on  Lasix 40 and 20 daily  (received 2 doses IV lasix 80 here)  Losartan held   ON pindolol at home 5 bid  Would send home on 40 lasix daily with 10 KCL;   Hold losartan for now    Switch to home pindolol 5 bid     Stop ASA and Plavix   On Eliquis now

## 2018-02-13 NOTE — Anesthesia Postprocedure Evaluation (Signed)
Anesthesia Post Note  Patient: Meredith Gonzalez  Procedure(s) Performed: TRANSESOPHAGEAL ECHOCARDIOGRAM (TEE) (N/A ) CARDIOVERSION (N/A )     Patient location during evaluation: PACU Anesthesia Type: MAC Level of consciousness: awake and alert Pain management: pain level controlled Vital Signs Assessment: post-procedure vital signs reviewed and stable Respiratory status: spontaneous breathing, nonlabored ventilation and respiratory function stable Cardiovascular status: stable and blood pressure returned to baseline Postop Assessment: no apparent nausea or vomiting Anesthetic complications: no    Last Vitals:  Vitals:   02/13/18 1450 02/13/18 1452  BP: 126/60   Pulse: (!) 55   Resp: 20 20  Temp:    SpO2: 99%     Last Pain:  Vitals:   02/13/18 1450  TempSrc:   PainSc: 0-No pain                 Lynda Rainwater

## 2018-02-13 NOTE — Transfer of Care (Signed)
Immediate Anesthesia Transfer of Care Note  Patient: Meredith Gonzalez  Procedure(s) Performed: TRANSESOPHAGEAL ECHOCARDIOGRAM (TEE) (N/A ) CARDIOVERSION (N/A )  Patient Location: Endoscopy Unit  Anesthesia Type:MAC  Level of Consciousness: awake and alert   Airway & Oxygen Therapy: Patient Spontanous Breathing and Patient connected to nasal cannula oxygen  Post-op Assessment: Report given to RN and Post -op Vital signs reviewed and stable  Post vital signs: Reviewed and stable  Last Vitals:  Vitals Value Taken Time  BP 113/58 02/13/2018  2:25 PM  Temp 36.5 C 02/13/2018  2:25 PM  Pulse 77 02/13/2018  2:25 PM  Resp 13 02/13/2018  2:25 PM  SpO2 100 % 02/13/2018  2:25 PM  Vitals shown include unvalidated device data.  Last Pain:  Vitals:   02/13/18 1425  TempSrc: Oral  PainSc: 0-No pain      Patients Stated Pain Goal: 2 (04/88/89 1694)  Complications: No apparent anesthesia complications

## 2018-02-14 LAB — BASIC METABOLIC PANEL
Anion gap: 7 (ref 5–15)
BUN: 28 mg/dL — AB (ref 8–23)
CO2: 32 mmol/L (ref 22–32)
CREATININE: 0.96 mg/dL (ref 0.44–1.00)
Calcium: 8.3 mg/dL — ABNORMAL LOW (ref 8.9–10.3)
Chloride: 99 mmol/L (ref 98–111)
GFR calc Af Amer: >60 mL/min (ref >60)
GFR, EST NON AFRICAN AMERICAN: 54 mL/min — AB (ref >60)
Glucose, Bld: 114 mg/dL — ABNORMAL HIGH (ref 70–99)
Potassium: 3.7 mmol/L (ref 3.5–5.1)
SODIUM: 138 mmol/L (ref 135–145)

## 2018-02-14 MED ORDER — APIXABAN 5 MG PO TABS: 5.0000 mg | ORAL_TABLET | Freq: Two times a day (BID) | ORAL | 0 refills | Status: DC

## 2018-02-14 MED ORDER — METOPROLOL TARTRATE 50 MG PO TABS: 50.0000 mg | ORAL_TABLET | Freq: Two times a day (BID) | ORAL | Status: DC

## 2018-02-14 MED ORDER — APIXABAN 5 MG PO TABS: 5.0000 mg | ORAL_TABLET | Freq: Two times a day (BID) | ORAL | 3 refills | Status: DC

## 2018-02-14 MED ORDER — LOSARTAN POTASSIUM 25 MG PO TABS: 25.0000 mg | ORAL_TABLET | Freq: Every day | ORAL | Status: DC

## 2018-02-14 MED ORDER — FUROSEMIDE 40 MG PO TABS: 60.0000 mg | ORAL_TABLET | Freq: Every day | ORAL | 3 refills | Status: DC

## 2018-02-14 MED ORDER — PINDOLOL 5 MG PO TABS: 5.0000 mg | ORAL_TABLET | Freq: Two times a day (BID) | ORAL | Status: DC

## 2018-02-14 MED ORDER — POTASSIUM CHLORIDE CRYS ER 10 MEQ PO TBCR: 10.0000 meq | EXTENDED_RELEASE_TABLET | Freq: Every day | ORAL | 3 refills | Status: DC

## 2018-02-14 NOTE — Progress Notes (Signed)
Progress Note  Patient Name: Meredith Gonzalez Date of Encounter: 02/14/2018  Primary Cardiologist: Dorris Carnes, MD   Subjective   Maintaining sinus this morning - see Dr. Harrington Challenger' note yesterday. [place] for d/c home today. BP up this morning, but has not received losartan yet.  Inpatient Medications    Scheduled Meds: . amitriptyline  25 mg Oral QHS  . ammonium lactate  1 application Topical BID  . apixaban  5 mg Oral BID  . atorvastatin  40 mg Oral Daily  . calcium-vitamin D  1 tablet Oral BID WC  . ferrous sulfate  325 mg Oral BID WC  . FLUoxetine  60 mg Oral Daily  . furosemide  40 mg Oral Daily  . gabapentin  300 mg Oral Daily  . gabapentin  600 mg Oral QHS  . Melatonin  9 mg Oral QHS  . metoprolol tartrate  25 mg Oral Q6H  . multivitamin with minerals  1 tablet Oral Daily  . pantoprazole  40 mg Oral Daily  . potassium chloride  10 mEq Oral Daily  . povidone-iodine   Topical BID  . pramipexole  0.25 mg Oral q morning - 10a  . sodium chloride flush  3 mL Intravenous Q12H  . vitamin B-12  1,000 mcg Oral Daily   Continuous Infusions: . sodium chloride     PRN Meds: sodium chloride, acetaminophen, albuterol, diphenhydrAMINE, fluticasone, guaiFENesin-dextromethorphan, nitroGLYCERIN, ondansetron (ZOFRAN) IV, sodium chloride flush   Vital Signs    Vitals:   02/14/18 0153 02/14/18 0548 02/14/18 0554 02/14/18 0614  BP: (!) 129/59 (!) 152/71  (!) 144/66  Pulse: 61 64  64  Resp:  18  18  Temp:  98.1 F (36.7 C)  98.2 F (36.8 C)  TempSrc:  Oral  Oral  SpO2:  94%  94%  Weight:   63.4 kg   Height:        Intake/Output Summary (Last 24 hours) at 02/14/2018 0944 Last data filed at 02/14/2018 0153 Gross per 24 hour  Intake 817.8 ml  Output 1901 ml  Net -1083.2 ml   Filed Weights   02/13/18 0417 02/13/18 1242 02/14/18 0554  Weight: 64.5 kg 64.5 kg 63.4 kg    Telemetry    Atrial paced rhythm- Personally Reviewed  Physical Exam   General appearance: alert and no  distress Lungs: clear to auscultation bilaterally Heart: regular rate and rhythm Extremities: extremities normal, atraumatic, no cyanosis or edema Neurologic: Grossly normal   Labs    Chemistry Recent Labs  Lab 02/10/18 1519  02/12/18 0515 02/13/18 0458 02/14/18 0617  NA 138   < > 138 141 138  K 3.7   < > 3.4* 3.9 3.7  CL 109   < > 102 104 99  CO2 22   < > 28 31 32  GLUCOSE 131*   < > 145* 106* 114*  BUN 20   < > 26* 30* 28*  CREATININE 0.69   < > 1.10* 1.07* 0.96  CALCIUM 8.6*   < > 8.5* 8.6* 8.3*  PROT 6.2*  --   --   --   --   ALBUMIN 2.9*  --   --   --   --   AST 24  --   --   --   --   ALT 19  --   --   --   --   ALKPHOS 80  --   --   --   --   BILITOT 0.9  --   --   --   --  GFRNONAA >60   < > 46* 48* 54*  GFRAA >60   < > 53* 55* >60  ANIONGAP 7   < > 8 6 7    < > = values in this interval not displayed.     Hematology Recent Labs  Lab 02/11/18 0414 02/12/18 0515 02/13/18 0458  WBC 6.1 6.0 6.5  RBC 3.08*  3.08* 3.15* 3.14*  HGB 8.8* 8.7* 8.9*  HCT 29.8* 30.6* 30.4*  MCV 96.8 97.1 96.8  MCH 28.6 27.6 28.3  MCHC 29.5* 28.4* 29.3*  RDW 16.8* 16.6* 16.4*  PLT 202 206 207    Cardiac Enzymes Recent Labs  Lab 02/10/18 1519  TROPONINI <0.03   No results for input(s): TROPIPOC in the last 168 hours.   BNP Recent Labs  Lab 02/10/18 1519  BNP 550.4*     DDimer No results for input(s): DDIMER in the last 168 hours.   Radiology    No results found.  Cardiac Studies   Echocardiogram 02/11/18: Study Conclusions  - Left ventricle: Systolic function was normal. The estimated   ejection fraction was in the range of 50% to 55%. - Aortic valve: Sclerosis without stenosis. - Mitral valve: There was mild regurgitation. - Left atrium: The atrium was moderately dilated. - Right ventricle: The cavity size was moderately dilated. - Right atrium: The atrium was moderately dilated. - Atrial septum: No defect or patent foramen ovale was identified. -  Tricuspid valve: There was severe regurgitation. - Pulmonary arteries: PA peak pressure: 49 mm Hg (S). - Impressions: Concern that transvenous pacing wire is causing more   TR and elevated right heart pressures with worsening TR.  Impressions:  - Concern that transvenous pacing wire is causing more TR and   elevated right heart pressures with worsening TR.  Left heart cath 07/19/16:  Prox RCA to Mid RCA lesion, 20 %stenosed.  There is moderate left ventricular systolic dysfunction.  LV end diastolic pressure is mildly elevated.  The left ventricular ejection fraction is 35-45% by visual estimate.  1st Diag lesion, 95 %stenosed.  A STENT PROMUS PREM MR 2.5X16 drug eluting stent was successfully placed.  Post intervention, there is a 0% residual stenosis.  1. Single vessel occlusive CAD. 95% large diagonal branch 2. Moderate LV dysfunction. EF 35-40%. 3. Mildly elevated LVEDP 4. Successful stenting of the first diagonal with DES  Plan: DAPT for one year.  Patient Profile     82 y.o. female with PMH of CAD s/p PCI/DES to 1st diagonal 06/2016, paroxysmal atrial fibrillation (previously not on [facility] due to history of subdural hematoma), chronic systolic CHF (EF 28-78% on cath 06/2016)  Assessment & Plan    1. Acute on chronic diastolic CHF: She presented with weight gain, LE edema, cough, and orthopnea. Weight up ~13lbs (dry weight 140lbs) despite taking lasix 40mg  qAM and 20mg  qPM  No change in diet   Interrogation of pacemaker shows she went into atrial fibrillation on 10/21     She has diuresed since admit   Volume looks good Adequately diuresed - home on lasix 40 mg daily with KCL 10 MEQ.   2. Paroxysmal atrial fibrillation: felt that her atrial fibrillation is likely contributing to #1.  She had not beein on anticoag because of history fall (remote) with subdural hematoma   CT negative   Now on Eliquis Successful cardioversion yesterday - maintaining sinus - switch to home  pindolol per Dr. Harrington Challenger  3. HTN: BP up today - was on losartan, may need  to consider restarting, however, Dr. Harrington Challenger recommended holding it. Creatinine is back to normal.  4. CAD s/p PCI/DES to 1st diagonal 06/2016: no anginal complaints. ASA and plavix stopped given need for anticoagulation.  - Continue statin  5. SSS s/p PPM: c/f pacing wire causing worsening of TR.  - Will focus on diuresis/ rhythm control at this time   Follow response and follow as outpt    Ok for d/c home today. Follow-up with Dr. Harrington Challenger.   For questions or updates, please contact [place] Please consult [email or web address] for contact info under Cardiology/STEMI.   Pixie Casino, MD, [facility], [place] Director of the [facility] Lipid Disorders &  Cardiovascular Risk Reduction Clinic Diplomate of the American Board of Clinical Lipidology Attending Cardiologist  Direct Dial: [phone]  Fax: [phone]  Website:  [email or web address]  Pixie Casino, MD  02/14/2018, 9:44 AM

## 2018-02-14 NOTE — Plan of Care (Signed)
Problem: Education: Goal: Knowledge of General Education information will improve Description Including pain rating scale, medication(s)/side effects and non-pharmacologic comfort measures Outcome: Adequate for Discharge   Problem: Health Behavior/Discharge Planning: Goal: Ability to manage health-related needs will improve Outcome: Adequate for Discharge   Problem: Clinical Measurements: Goal: Ability to maintain clinical measurements within normal limits will improve Outcome: Adequate for Discharge Goal: Will remain free from infection Outcome: Adequate for Discharge Goal: Diagnostic test results will improve Outcome: Adequate for Discharge Goal: Respiratory complications will improve Outcome: Adequate for Discharge Goal: Cardiovascular complication will be avoided Outcome: Adequate for Discharge   Problem: Activity: Goal: Risk for activity intolerance will decrease Outcome: Adequate for Discharge   Problem: Nutrition: Goal: Adequate nutrition will be maintained Outcome: Adequate for Discharge   Problem: Coping: Goal: Level of anxiety will decrease Outcome: Adequate for Discharge   Problem: Elimination: Goal: Will not experience complications related to bowel motility Outcome: Adequate for Discharge Goal: Will not experience complications related to urinary retention Outcome: Adequate for Discharge   Problem: Pain Managment: Goal: General experience of comfort will improve Outcome: Adequate for Discharge   Problem: Safety: Goal: Ability to remain free from injury will improve Outcome: Adequate for Discharge   Problem: Skin Integrity: Goal: Risk for impaired skin integrity will decrease Outcome: Adequate for Discharge   Problem: Education: Goal: Ability to demonstrate management of disease process will improve Outcome: Adequate for Discharge Goal: Ability to verbalize understanding of medication therapies will improve Outcome: Adequate for Discharge Goal:  Individualized Educational Video(s) Outcome: Adequate for Discharge   Problem: Activity: Goal: Capacity to carry out activities will improve Outcome: Adequate for Discharge   Problem: Cardiac: Goal: Ability to achieve and maintain adequate cardiopulmonary perfusion will improve Outcome: Adequate for Discharge   Problem: Education: Goal: Knowledge of disease or condition will improve Outcome: Adequate for Discharge Goal: Understanding of medication regimen will improve Outcome: Adequate for Discharge Goal: Individualized Educational Video(s) Outcome: Adequate for Discharge   Problem: Activity: Goal: Ability to tolerate increased activity will improve Outcome: Adequate for Discharge   Problem: Cardiac: Goal: Ability to achieve and maintain adequate cardiopulmonary perfusion will improve Outcome: Adequate for Discharge   Problem: Health Behavior/Discharge Planning: Goal: Ability to safely manage health-related needs after discharge will improve Outcome: Adequate for Discharge   Problem: Education: Goal: Knowledge of General Education information will improve Description Including pain rating scale, medication(s)/side effects and non-pharmacologic comfort measures Outcome: Adequate for Discharge   Problem: Health Behavior/Discharge Planning: Goal: Ability to manage health-related needs will improve Outcome: Adequate for Discharge   Problem: Clinical Measurements: Goal: Ability to maintain clinical measurements within normal limits will improve Outcome: Adequate for Discharge Goal: Will remain free from infection Outcome: Adequate for Discharge Goal: Diagnostic test results will improve Outcome: Adequate for Discharge Goal: Respiratory complications will improve Outcome: Adequate for Discharge Goal: Cardiovascular complication will be avoided Outcome: Adequate for Discharge   Problem: Activity: Goal: Risk for activity intolerance will decrease Outcome: Adequate for  Discharge   Problem: Nutrition: Goal: Adequate nutrition will be maintained Outcome: Adequate for Discharge   Problem: Coping: Goal: Level of anxiety will decrease Outcome: Adequate for Discharge   Problem: Elimination: Goal: Will not experience complications related to bowel motility Outcome: Adequate for Discharge Goal: Will not experience complications related to urinary retention Outcome: Adequate for Discharge   Problem: Pain Managment: Goal: General experience of comfort will improve Outcome: Adequate for Discharge   Problem: Safety: Goal: Ability to remain free from injury will improve Outcome:  Adequate for Discharge   Problem: Skin Integrity: Goal: Risk for impaired skin integrity will decrease Outcome: Adequate for Discharge   Problem: Education: Goal: Ability to demonstrate management of disease process will improve Outcome: Adequate for Discharge Goal: Ability to verbalize understanding of medication therapies will improve Outcome: Adequate for Discharge Goal: Individualized Educational Video(s) Outcome: Adequate for Discharge   Problem: Activity: Goal: Capacity to carry out activities will improve Outcome: Adequate for Discharge   Problem: Cardiac: Goal: Ability to achieve and maintain adequate cardiopulmonary perfusion will improve Outcome: Adequate for Discharge   Problem: Education: Goal: Knowledge of disease or condition will improve Outcome: Adequate for Discharge Goal: Understanding of medication regimen will improve Outcome: Adequate for Discharge Goal: Individualized Educational Video(s) Outcome: Adequate for Discharge   Problem: Activity: Goal: Ability to tolerate increased activity will improve Outcome: Adequate for Discharge   Problem: Cardiac: Goal: Ability to achieve and maintain adequate cardiopulmonary perfusion will improve Outcome: Adequate for Discharge   Problem: Health Behavior/Discharge Planning: Goal: Ability to safely  manage health-related needs after discharge will improve Outcome: Adequate for Discharge

## 2018-02-14 NOTE — Discharge Summary (Signed)
Discharge Summary    Patient ID: Meredith Gonzalez,  MRN: [id number], DOB/AGE: 1935/10/30 82 y.o.  Admit date: 02/10/2018 Discharge date: 02/14/2018  Primary Care Provider: [facility] Primary Cardiologist: Dorris Carnes, MD  Discharge Diagnoses    Active Problems:   Acute on chronic systolic CHF (congestive heart failure) (HCC)   Atrial fibrillation with RVR (HCC)   Allergies Allergies  Allergen Reactions  . Valium [Diazepam] Anxiety    Makes patient hyper Makes patient hyper  . Darifenacin Itching    Can take with Benadryl  . Darvon [Propoxyphene Hcl] Itching    Can take with Benadryl  . Daypro [Oxaprozin] Itching    Can take with Benadryl Can take with Benadryl  . Enablex [Darifenacin Hydrobromide Er] Itching    Can take with Benadryl  . Propoxyphene Itching    Can take with Benadryl  . Risperdal [Risperidone] Itching    Can take with Benadryl Can take with Benadryl  . Talwin [Pentazocine] Itching    Can take with Benadryl Can take with Benadryl  . Vicodin [Hydrocodone-Acetaminophen] Itching    Can take with Benadryl Can take with Benadryl  . Codeine Hives and Itching    Can take with Benadryl Can take with Benadryl  . Oxycodone Itching    Can take with Benadryl Can take with Benadryl Can take with Benadryl Can take with Benadryl    Diagnostic Studies/Procedures    Echocardiogram 02/11/18: Study Conclusions  - Left ventricle: Systolic function was normal. The estimated ejection fraction was in the range of 50% to 55%. - Aortic valve: Sclerosis without stenosis. - Mitral valve: There was mild regurgitation. - Left atrium: The atrium was moderately dilated. - Right ventricle: The cavity size was moderately dilated. - Right atrium: The atrium was moderately dilated. - Atrial septum: No defect or patent foramen ovale was identified. - Tricuspid valve: There was severe regurgitation. - Pulmonary arteries: PA peak pressure: 49 mm Hg (S). -  Impressions: Concern that transvenous pacing wire is causing more TR and elevated right heart pressures with worsening TR.  Impressions:  - Concern that transvenous pacing wire is causing more TR and elevated right heart pressures with worsening TR. _____________   History of Present Illness     82 y.o.femalehere for the evaluation of bilateral leg swelling at the request of Dr. Vertell Limber  She has seen Dr. Cristopher Peru in the past for her pacemaker in the electrophysiology department. Ejection fraction is approximately 40%. Has hypertension paroxysmal atrial fibrillation and sinus node dysfunction leading to pacemaker, CAD post PCI to diagonal early 2018. Remote history of subdural hematoma. She was not on anticoagulation because of this previously.  In early October, she fell and fractured her C-spine. She was in a neck brace until yesterday. Dr. Vertell Limber has been evaluating her. Over the last 6 weeks or so, she has gained approximately 15 pounds, her legs are becoming more tense, she has developed a worsening cough and orthopnea, having trouble laying down to sleep. She would have to stop walking from here to the elevator. She is not having any chest pain. She is trying to alleviate her cough with cough drops and Delsym but without success. Her symptoms currently are quite severe.  Hospital Course     Consultants: None   1. Acute on chronic diastolic CHF: presented with weight gain, LE edema, orthopnea, and cough despite taking lasix 40mg  qAM and 20mg  qPM. She was diuresed with IV lasix and symptoms improved. Echo this admission with  EF 50-55% and severe TR felt to be 2/2 pacing wire. UOP net -6.1L this admission. Weight 152lbs on admission down to 140lbs on the day of discharge. She was recommended to continue lasix 40mg  daily with potassium 10 mEq daily at discharge.  - Continue lasix and potassium - Continue BBlocker - Continue low sodium diet  2. Paroxysmal atrial  fibrillation: PPM interrogated this admission and revealed Afib onset 01/12/18. Felt to be contributing to #1. She had previously not been on an anticoagulant due to subdural hematoma 2/2 fall, however recent CT Head 12/2017 was negative and Eliquis was initiated this admission. She underwent TEE/DCCV 02/13/18 which resulted in successful conversion to NSR.  - Continue eliquis 5mg  BID for stroke ppx - Continue home pindolol for rate control  3. HTN: BP mildly elevated. Losartan was held in the setting of AKI. Per Dr. Harrington Challenger, recommended to hold losartan at discharge.  - Consider restarting losartan outpatient if BP continues to be elevated and Cr stable  4. CAD s/p PCI/DES to 1st diagonal 06/2016: no anginal complaints this admission. ASA and plavix discontinued this admission given need for anticoagulation for atrial fibrillation.  - Continue statin  5. SSS s/p PPM: Echo this admission with c/f pacing wire causing worsening of TR. Admission focused on diuresis/rhythm control for management - Continue follow-up outpatient with Dr. Lovena Le _____________  Discharge Vitals Blood pressure (!) 144/66, pulse 64, temperature 98.2 F (36.8 C), temperature source Oral, resp. rate 18, height 5\' 5"  (1.651 m), weight 63.4 kg, SpO2 94 %.  Filed Weights   02/13/18 0417 02/13/18 1242 02/14/18 0554  Weight: 64.5 kg 64.5 kg 63.4 kg    Labs & Radiologic Studies    CBC Recent Labs    02/12/18 0515 02/13/18 0458  WBC 6.0 6.5  HGB 8.7* 8.9*  HCT 30.6* 30.4*  MCV 97.1 96.8  PLT 206 431   Basic Metabolic Panel Recent Labs    02/13/18 0458 02/14/18 0617  NA 141 138  K 3.9 3.7  CL 104 99  CO2 31 32  GLUCOSE 106* 114*  BUN 30* 28*  CREATININE 1.07* 0.96  CALCIUM 8.6* 8.3*   Liver Function Tests No results for input(s): AST, ALT, ALKPHOS, BILITOT, PROT, ALBUMIN in the last 72 hours. No results for input(s): LIPASE, AMYLASE in the last 72 hours. Cardiac Enzymes No results for input(s): CKTOTAL,  CKMB, CKMBINDEX, TROPONINI in the last 72 hours. BNP Invalid input(s): POCBNP D-Dimer No results for input(s): DDIMER in the last 72 hours. Hemoglobin A1C No results for input(s): HGBA1C in the last 72 hours. Fasting Lipid Panel No results for input(s): CHOL, HDL, LDLCALC, TRIG, CHOLHDL, LDLDIRECT in the last 72 hours. Thyroid Function Tests No results for input(s): TSH, T4TOTAL, T3FREE, THYROIDAB in the last 72 hours.  Invalid input(s): FREET3 _____________  Dg Chest 2 View  Result Date: 02/11/2018 CLINICAL DATA:  Cough for 2 weeks. EXAM: CHEST - 2 VIEW COMPARISON:  02/02/2018 and prior radiograph FINDINGS: Cardiomegaly and LEFT pacemaker again identified in this low volume film. Peribronchial thickening/interstitial prominence again noted. There is no evidence of focal airspace disease, pulmonary edema, suspicious pulmonary nodule/mass, pleural effusion, or pneumothorax. No acute bony abnormalities are identified. IMPRESSION: No evidence of acute cardiopulmonary disease. Cardiomegaly with unchanged mild peribronchial thickening/interstitial prominence. Electronically Signed   By: Margarette Canada M.D.   On: 02/11/2018 09:08   Disposition   Patient was seen and examined by Dr. Debara Pickett who deemed patient as stable for discharge. Follow-up has been arranged.  Discharge medications as listed below.   Follow-up [facility] up.   Why:  They will do your home health care at your home Contact information: [place] [address] [phone]        Richardson Dopp T, PA-C Follow up on 03/02/2018.   Specialties:  Cardiology, Physician Assistant Why:  Please arrive 15 minutes early for your 8:45am post hospital cardiology appointment Contact information: [address] [place] [address] [phone]          Discharge Instructions    Diet - low sodium heart healthy    Complete by:  As directed    Increase activity slowly   Complete by:  As directed       Discharge Medications   Allergies as of 02/14/2018      Reactions   Valium [diazepam] Anxiety   Makes patient hyper Makes patient hyper   Darifenacin Itching   Can take with Benadryl   Darvon [propoxyphene Hcl] Itching   Can take with Benadryl   Daypro [oxaprozin] Itching   Can take with Benadryl Can take with Benadryl   Enablex [darifenacin Hydrobromide Er] Itching   Can take with Benadryl   Propoxyphene Itching   Can take with Benadryl   Risperdal [risperidone] Itching   Can take with Benadryl Can take with Benadryl   Talwin [pentazocine] Itching   Can take with Benadryl Can take with Benadryl   Vicodin [hydrocodone-acetaminophen] Itching   Can take with Benadryl Can take with Benadryl   Codeine Hives, Itching   Can take with Benadryl Can take with Benadryl   Oxycodone Itching   Can take with Benadryl Can take with Benadryl Can take with Benadryl Can take with Benadryl      Medication List    STOP taking these medications   aspirin EC 81 MG tablet   cephALEXin 500 MG capsule Commonly known as:  KEFLEX   clopidogrel 75 MG tablet Commonly known as:  PLAVIX   HYDROcodone-acetaminophen 5-325 MG tablet Commonly known as:  NORCO/VICODIN   losartan 25 MG tablet Commonly known as:  COZAAR     TAKE these medications   acetaminophen 500 MG tablet Commonly known as:  TYLENOL Take 500 mg by mouth as needed for moderate pain.   albuterol 108 (90 Base) MCG/ACT inhaler Commonly known as:  PROVENTIL HFA;VENTOLIN HFA Inhale 2 puffs into the lungs every 6 (six) hours as needed.   amitriptyline 25 MG tablet Commonly known as:  ELAVIL Take 25 mg by mouth at bedtime.   ammonium lactate 12 % lotion Commonly known as:  LAC-HYDRIN Apply 1 application topically 2 (two) times daily.   apixaban 5 MG Tabs tablet Commonly known as:  ELIQUIS Take 1 tablet (5 mg total) by mouth  2 (two) times daily. Prescription for free 30 day supply   apixaban 5 MG Tabs tablet Commonly known as:  ELIQUIS Take 1 tablet (5 mg total) by mouth 2 (two) times daily.   atorvastatin 40 MG tablet Commonly known as:  LIPITOR Take 1 tablet (40 mg total) by mouth daily.   BENADRYL ALLERGY 25 MG tablet Generic drug:  diphenhydrAMINE Take 25 mg by mouth every 8 (eight) hours as needed.   Calcium Citrate-Vitamin D3 315-250 MG-UNIT Tabs Take 1 tablet by mouth 2 (two) times daily.   ferrous sulfate 325 (65 FE) MG tablet Take 1 tablet (325  mg total) by mouth 2 (two) times daily with a meal.   flintstones complete 60 MG chewable tablet Chew 1 tablet by mouth daily.   FLUoxetine 20 MG capsule Commonly known as:  PROZAC Take 60 mg by mouth daily.   fluticasone 50 MCG/ACT nasal spray Commonly known as:  FLONASE Place 1 spray into both nostrils as needed.   furosemide 40 MG tablet Commonly known as:  LASIX Take 1.5 tablets (60 mg total) by mouth daily. Patient reports taking 40mg  in he morning and 20mg  at 3pm every day. What changed:  medication strength   gabapentin 300 MG capsule Commonly known as:  NEURONTIN Take 300-600 mg by mouth 2 (two) times daily. 300 mg in the mornning; 600 mg at night   Melatonin 10 MG Tabs Take 40 mg by mouth at bedtime.   MULTIVITAMIN ADULT Tabs Take 1 tablet by mouth daily.   nitroGLYCERIN 0.4 MG SL tablet Commonly known as:  NITROSTAT Place 1 tablet (0.4 mg total) under the tongue every 5 (five) minutes as needed for chest pain.   pantoprazole 40 MG tablet Commonly known as:  PROTONIX Take 1 tablet (40 mg total) by mouth daily. Can switch to any PPI   pindolol 5 MG tablet Commonly known as:  VISKEN Take 1 tablet (5 mg total) by mouth 2 (two) times daily.   potassium chloride 10 MEQ tablet Commonly known as:  K-DUR,KLOR-CON Take 1 tablet (10 mEq total) by mouth daily. Monday, Wednesday & Friday What changed:    medication  strength  how much to take  when to take this   pramipexole 0.25 MG tablet Commonly known as:  MIRAPEX Take 0.25 mg by mouth every morning.   vitamin B-12 1000 MCG tablet Commonly known as:  CYANOCOBALAMIN Take 1 tablet (1,000 mcg total) by mouth daily.          Outstanding Labs/Studies   None  Duration of Discharge Encounter   Greater than 30 minutes including physician time.  Signed, Abigail Butts PA-C 02/14/2018, 11:30 AM

## 2018-02-16 ENCOUNTER — Telehealth: Payer: Self-pay

## 2018-02-16 ENCOUNTER — Encounter (HOSPITAL_COMMUNITY): Payer: Self-pay | Admitting: Cardiology

## 2018-02-16 NOTE — Telephone Encounter (Signed)
The nurse navigator for Dr. Tamala Julian (patient's PCP) called to confirm medications after discharge. She asks specifically about Lasix and potassium.  Lasix is written for 40mg  qAM and 20mg  qPM, but was only given 30 tablets with refills.  Potassium is written as 10 meq to be taken MWF, but 90 tablets were sent in to her pharmacy.  Her last K level was 3.7.  The nurse asks for Dr. Harrington Challenger to review medications for specific recommendations and for the patient to be called with clarification.

## 2018-02-17 NOTE — Telephone Encounter (Signed)
TCM Call: 1st attempt I left a message on the pts VM asking her to call us back.

## 2018-02-17 NOTE — Telephone Encounter (Signed)
3rd attempt  LMOVM for to return call.

## 2018-02-17 NOTE — Telephone Encounter (Signed)
Need to have refills of lasix and postassium as directed on discharge summary

## 2018-02-18 MED ORDER — FUROSEMIDE 40 MG PO TABS: 60.0000 mg | ORAL_TABLET | Freq: Every day | ORAL | 3 refills | Status: DC

## 2018-02-18 MED ORDER — POTASSIUM CHLORIDE CRYS ER 10 MEQ PO TBCR: 10.0000 meq | EXTENDED_RELEASE_TABLET | Freq: Every day | ORAL | 3 refills | Status: DC

## 2018-02-18 MED ORDER — FUROSEMIDE 40 MG PO TABS: 40.0000 mg | ORAL_TABLET | Freq: Every day | ORAL | 3 refills | Status: DC

## 2018-02-18 MED ORDER — POTASSIUM CHLORIDE ER 10 MEQ PO TBCR: 10.0000 meq | EXTENDED_RELEASE_TABLET | Freq: Every day | ORAL | 3 refills | Status: DC

## 2018-02-18 NOTE — Telephone Encounter (Signed)
Reviewed dc instructions/progress notes and then reviewed with Richardson Dopp, PA-C.  Pt should be taking lasix 40 mg daily and potassium 10 meq daily and should have BMET by home care next early next week.  [place] and clarified correct doses/instructions with pharmacist. [facility], spoke with Justice Rocher at ext [place] Team.   faxed order for BMET for 02-23-18 to [phone].

## 2018-02-18 NOTE — Telephone Encounter (Signed)
No answer at patient's home number. Spoke with patient's son.  He said home care nurse, Cecille Rubin is coming today to do assessment.  Her phone # [phone]. I spoke with Cecille Rubin and informed of medication clarifications and order for BMET next Monday.

## 2018-02-18 NOTE — Telephone Encounter (Signed)
2nd attempt  Left the pt a message to call the office back and request to speak with a triage nurse, for TCM follow-up call.

## 2018-02-23 NOTE — Telephone Encounter (Signed)
lpmtcb 12/2 3rd attempt TCM follow-up

## 2018-03-01 NOTE — Progress Notes (Signed)
Cardiology Office Note:    Date:  03/02/2018   ID:  [name], DOB Oct 19, 1935, MRN [id number]  PCP:  Sinclair Ship, MD  Cardiologist:  Dorris Carnes, MD   Electrophysiologist:  Cristopher Peru, MD   Referring MD: Sinclair Ship, MD   Chief Complaint  Patient presents with  . Hospitalization Follow-up    acute CHF, AF with RVR    History of Present Illness:    Meredith Gonzalez is a 82 y.o. female with AFib, s/p fall resulting in subdural hematoma, HTN, hypothyroidism, s/p pacemaker (RV lead revision done in 2014), coronary artery disease s/p NSTEMI in 4/18 treated with a DES to the Dx, combined systolic and diastolic CHF, syncope felt to be related to autonomic dysfunction.  She is not a candidate for anticoagulation due to prior fall with subdural hematoma.  Prior tilt table test was positive and she was changed from Diltiazem to Pindolol.  She had a fall and fractured her c-spine in 12/2017.    She was admitted from the office by Dr. Marlou Porch 02/10/18 for decompensated congestive heart failure and atrial fibrillation with rapid rate.  She was diuresed with IV Lasix.  DC weight was 140 lbs.  Echo demonstrated normal LVF with severe TR that was felt to be 2/2 to the pacing wire.  It was felt that the atrial fibrillation was contributing to her heart failure and she underwent successful TEE-DCCV.  She was DC on Eliquis 5 mg Twice daily.     [name] returns for follow-up.  She is here with her son.  Since discharge, her weights have been stable.  Her breathing had improved proved.  However, over the past 24 hours, she has noted increased cough and wheezing as well as some shortness of breath.  She denies chest discomfort, paroxysmal nocturnal dyspnea.  She has chronic lower extremity swelling without significant change.  She denies syncope.  Prior CV studies:   The following studies were reviewed today:  TEE 02/13/18 EF 50-55, no RWMA, mild MR, mod LAE, mild RV dilation, mild RAE, atrial septal  lipomatous hypertrophy, no R-L atrial level shunt, mod TR, mild PI  Echo 02/11/18 EF 50-55, mild MR, mod LAE, mod RV dilation, mod RAE, severe TR, PASP 49  LHC 07/19/16 LM normal LAD okay, D1 95 LCx normal RCA proximal 20 EF 35-45 LVEDP 20 PCI: 2.5 x 16 mm Promus DES to the D1  Limited echo 07/17/16 EF 50, apical anterior, apical septal and true apical HK, MAC, trivial MR, mild LAE  Echo 07/14/16 Apical, apical inferolateral, apical anterior, apical lateral HK, normal LV function, grade 2 diastolic dysfunction, trivial MR, mild LAE, PASP 50  Myoview 07/16/16 Anteroapical infarct with peri-infarct ischemia, EF 38, high risk  Past Medical History:  Diagnosis Date  . Anemia   . Anxiety   . Arthritis    "all over" (02/11/2018)  . Basal cell carcinoma    "left leg" (02/11/2018)  . Cervical spine fracture ([place]) 12/2017   "C1-2"  . CHF (congestive heart failure) ([place])   . Chronic bronchitis ([place])   . Chronic neck pain    "since I broke my neck 6-8 wk ago" (02/11/2018)  . Diabetic peripheral neuropathy ([place])   . Fibromyalgia   . Fracture of right humerus   . Generalized weakness   . Headache    "weekly" (02/11/2018)  . History of blood transfusion    "related to one of my femur surgeries" (02/11/2018)  . History of echocardiogram  Echo 8/18: EF 50-55, no RWMA, Gr 1 DD, calcified AV leaflets, MAC, trivial MR, mod LAE, PASP 37  . Hypertension   . Hypothyroidism   . Incontinence of urine   . Ischemic stroke ([place]) 2000   "lost part of the vision in my right eye" (02/11/2018)  . Major depression, chronic   . Myocardial infarction ([place]) 06/2016  . Pacemaker   . Recurrent falls   . Syncope and collapse   . Type 2 diabetes, diet controlled ([place])    Surgical Hx: The patient  has a past surgical history that includes Abdominal hysterectomy; Femur fracture surgery (Bilateral); Carpal tunnel release; Gastric bypass; Eye surgery; Colonoscopy; Lead revision (N/A, 01/01/2013);  LEFT HEART CATH AND CORONARY ANGIOGRAPHY (N/A, 07/19/2016); CORONARY STENT INTERVENTION (N/A, 07/19/2016); Excision basal cell carcinoma (Left); Ankle fracture surgery (Right); Tonsillectomy; Cholecystectomy open; Fracture surgery; Cataract extraction w/ intraocular lens  implant, bilateral (Bilateral); Insert / replace / remove pacemaker (~ 2012); Ovarian cyst surgery; Coronary angioplasty with stent (06/2016); Cardiac catheterization (~ 2014); TEE without cardioversion (N/A, 02/13/2018); and Cardioversion (N/A, 02/13/2018).   Current Medications: Current Meds  Medication Sig  . acetaminophen (TYLENOL) 500 MG tablet Take 500 mg by mouth as needed for moderate pain.   Marland Kitchen albuterol (PROVENTIL HFA;VENTOLIN HFA) 108 (90 Base) MCG/ACT inhaler Inhale 2 puffs into the lungs every 6 (six) hours as needed.  Marland Kitchen amitriptyline (ELAVIL) 25 MG tablet Take 25 mg by mouth at bedtime.  Marland Kitchen ammonium lactate (LAC-HYDRIN) 12 % lotion Apply 1 application topically 2 (two) times daily.  Marland Kitchen apixaban (ELIQUIS) 5 MG TABS tablet Take 1 tablet (5 mg total) by mouth 2 (two) times daily. Prescription for free 30 day supply  . atorvastatin (LIPITOR) 40 MG tablet Take 1 tablet (40 mg total) by mouth daily.  . Calcium Citrate-Vitamin D3 315-250 MG-UNIT TABS Take 1 tablet by mouth 2 (two) times daily.  . diphenhydrAMINE (BENADRYL ALLERGY) 25 MG tablet Take 25 mg by mouth every 8 (eight) hours as needed.  . ferrous sulfate 325 (65 FE) MG tablet Take 1 tablet (325 mg total) by mouth 2 (two) times daily with a meal.  . flintstones complete (FLINTSTONES) 60 MG chewable tablet Chew 1 tablet by mouth daily.  Marland Kitchen FLUoxetine (PROZAC) 20 MG capsule Take 60 mg by mouth daily.   . fluticasone (FLONASE) 50 MCG/ACT nasal spray Place 1 spray into both nostrils as needed.  . gabapentin (NEURONTIN) 300 MG capsule Take 300-600 mg by mouth 2 (two) times daily. 300 mg in the mornning; 600 mg at night  . Melatonin 10 MG TABS Take 40 mg by mouth at bedtime.    . Multiple Vitamins-Minerals (MULTIVITAMIN ADULT) TABS Take 1 tablet by mouth daily.  . nitroGLYCERIN (NITROSTAT) 0.4 MG SL tablet Place 1 tablet (0.4 mg total) under the tongue every 5 (five) minutes as needed for chest pain.  . pantoprazole (PROTONIX) 40 MG tablet Take 1 tablet (40 mg total) by mouth daily. Can switch to any PPI  . pramipexole (MIRAPEX) 0.25 MG tablet Take 0.25 mg by mouth every morning.  . vitamin B-12 (CYANOCOBALAMIN) 1000 MCG tablet Take 1 tablet (1,000 mcg total) by mouth daily.  . [DISCONTINUED] furosemide (LASIX) 40 MG tablet Take 1 tablet (40 mg total) by mouth daily.  . [DISCONTINUED] furosemide (LASIX) 40 MG tablet Take 40 mg  Tablet by mouth in the morning and 20 mg tablet in evening daily  . [DISCONTINUED] pindolol (VISKEN) 5 MG tablet Take 1 tablet (5 mg total) by  mouth 2 (two) times daily.  . [DISCONTINUED] potassium chloride (K-DUR) 10 MEQ tablet Take 1 tablet (10 mEq total) by mouth daily.     Allergies:   Valium [diazepam]; Codeine; Darifenacin; Darvon [propoxyphene hcl]; Daypro [oxaprozin]; Enablex [darifenacin hydrobromide er]; Oxycodone; Propoxyphene; Risperdal [risperidone]; Talwin [pentazocine]; and Vicodin [hydrocodone-acetaminophen]   Social History   Tobacco Use  . Smoking status: Former Smoker    Packs/day: 0.50    Years: 4.00    Pack years: 2.00    Types: Cigarettes    Last attempt to quit: 08/07/1972    Years since quitting: 45.5  . Smokeless tobacco: Never Used  Substance Use Topics  . Alcohol use: Yes    Frequency: Never    Comment: 02/11/2018 "couple drinks/year; if that"  . Drug use: Never     Family Hx: The patient's family history includes Alcoholism in her brother, brother, father, and sister; Congestive Heart Failure in her mother; Heart attack in her father; Throat cancer in her brother.  ROS:   Please see the history of present illness.    ROS All other systems reviewed and are negative.   EKGs/Labs/Other Test Reviewed:     EKG:  EKG is  ordered today.  The ekg ordered today demonstrates atrial fibrillation, HR 130, left axis deviation, RBBB  Recent Labs: 02/10/2018: ALT 19; B Natriuretic Peptide 550.4; Magnesium 2.1; TSH 8.636 02/13/2018: Hemoglobin 8.9; Platelets 207 02/14/2018: BUN 28; Creatinine, Ser 0.96; Potassium 3.7; Sodium 138   Recent Lipid Panel Lab Results  Component Value Date/Time   CHOL 95 (L) 03/20/2017 07:28 AM   TRIG 45 03/20/2017 07:28 AM   HDL 58 03/20/2017 07:28 AM   CHOLHDL 1.6 03/20/2017 07:28 AM   CHOLHDL 2.7 07/18/2016 03:49 AM   LDLCALC 28 03/20/2017 07:28 AM    Physical Exam:    VS:  BP 140/80   Pulse (!) 130   Ht _0  (1.651 m)   Wt 138 lb 1.9 oz (62.7 kg)   SpO2 98%   BMI 22.98 kg/m     Wt Readings from Last 3 Encounters:  03/02/18 138 lb 1.9 oz (62.7 kg)  02/14/18 139 lb 12.8 oz (63.4 kg)  02/10/18 153 lb 12.8 oz (69.8 kg)     Physical Exam  Constitutional: She is oriented to person, place, and time. She appears well-developed and well-nourished. No distress.  HENT:  Head: Normocephalic and atraumatic.  Eyes: No scleral icterus.  Neck: JVD present. No thyromegaly present.  Cardiovascular: An irregularly irregular rhythm present. Tachycardia present.  No murmur heard. Pulmonary/Chest: She has rales in the right lower field and the left lower field.  Abdominal: Soft. She exhibits no distension.  Musculoskeletal: She exhibits edema (tight 1-2+ bilat LE edema up to the thighs).  Lymphadenopathy:    She has no cervical adenopathy.  Neurological: She is alert and oriented to person, place, and time.  Skin: Skin is warm and dry.  Psychiatric: She has a normal mood and affect.    ASSESSMENT & PLAN:    Acute on chronic heart failure with preserved ejection fraction ([place]) She is somewhat volume overloaded in the setting of atrial fibrillation with rapid ventricular rate.  I reviewed her case today with Dr. Harrington Challenger.  We will place her on amiodarone to try to  achieve normal sinus rhythm.  I will increase her Lasix to 40 mg twice daily and increase her potassium to 10 mEq once daily.  Obtain BMET today and follow-up with a BMET a week.  I have advised to go the emergency room if her symptoms should worsen.  Atrial fibrillation with RVR ([place]) Status post recent admission to the hospital with decompensated heart failure in the setting of atrial fibrillation with rapid ventricular rate.  She is back in atrial fibrillation with elevated heart rate.  She is not aware that she is in atrial fibrillation.  However, she has had increased shortness of breath, cough and wheezing.  She is volume overloaded on exam.  I reviewed her case today with Dr. Harrington Challenger.  We will place her on amiodarone and adjust her pindolol for better rate control.  She will be brought back in 1 week for an EKG to recheck her heart rate.  Plan on cardioversion after adequate loading in 3 to 4 weeks.  -Start amiodarone 200 mg twice daily  -Increase pindolol to 10 mg in the morning and 5 mg in the evening  -Follow-up ECG in 1 week (ECG should be shown to Dr. Harrington Challenger prior to leaving office)  -Follow-up with Dr. Harrington Challenger or me in 3 to 4 weeks  -Plan cardioversion after 3 to 4 weeks if she remains in atrial fibrillation  Coronary artery disease involving native coronary artery of native heart without angina pectoris History of non-ST elevation myocardial infarction in 2018 treated with drug-eluting stent to the diagonal.  She currently denies any anginal symptoms.  She is not on aspirin as she is on Apixaban.  Continue atorvastatin, pindolol.  Essential hypertension  Blood pressure somewhat elevated today.  Adjust pindolol as noted.  Pacemaker  Continue follow-up with EP as planned.  Hyperlipidemia, unspecified hyperlipidemia type  Continue atorvastatin.  Tricuspid regurgitation Induced by pacemaker lead.  She has had previous lead revision.    Dispo:  Return in about 4 weeks (around 03/30/2018) for  Close Follow Up, w/ Dr. Harrington Challenger, or Richardson Dopp, PA-C.   Medication Adjustments/Labs and Tests Ordered: Current medicines are reviewed at length with the patient today.  Concerns regarding medicines are outlined above.  Tests Ordered: Orders Placed This Encounter  Procedures  . Basic metabolic panel  . Basic Metabolic Panel (BMET)  . EKG 12-Lead  . EKG 12-Lead   Medication Changes: Meds ordered this encounter  Medications  . amiodarone (PACERONE) 200 MG tablet    Sig: Take 1 tablet (200 mg total) by mouth 2 (two) times daily.    Dispense:  90 tablet    Refill:  3  . pindolol (VISKEN) 5 MG tablet    Sig: Take 10 mg in the AM and 5 mg in the PM daily by mouth    Dispense:  270 tablet    Refill:  3  . furosemide (LASIX) 40 MG tablet    Sig: Take 1 tablet (40 mg total) by mouth 2 (two) times daily.    Dispense:  180 tablet    Refill:  3  . DISCONTD: potassium chloride (K-DUR) 10 MEQ tablet    Sig: Take 1 tablet (10 mEq total) by mouth 2 (two) times daily.    Dispense:  180 tablet    Refill:  3  . potassium chloride (K-DUR) 10 MEQ tablet    Sig: Take 1 tablet (10 mEq total) by mouth daily.    Dispense:  180 tablet    Refill:  3    Signed, Richardson Dopp, PA-C  03/02/2018 2:58 PM    [address], [place], [place]  [address] Phone: [phone]; Fax: [phone]

## 2018-03-02 ENCOUNTER — Encounter: Payer: Self-pay | Admitting: Physician Assistant

## 2018-03-02 ENCOUNTER — Ambulatory Visit (INDEPENDENT_AMBULATORY_CARE_PROVIDER_SITE_OTHER): Payer: Medicare Other | Admitting: Physician Assistant

## 2018-03-02 VITALS — BP 140/80 | HR 130 | Ht 65.0 in | Wt 138.1 lb

## 2018-03-02 DIAGNOSIS — I361 Nonrheumatic tricuspid (valve) insufficiency: Secondary | ICD-10-CM

## 2018-03-02 DIAGNOSIS — Z950 Presence of cardiac pacemaker: Secondary | ICD-10-CM

## 2018-03-02 DIAGNOSIS — I255 Ischemic cardiomyopathy: Secondary | ICD-10-CM

## 2018-03-02 DIAGNOSIS — I5033 Acute on chronic diastolic (congestive) heart failure: Secondary | ICD-10-CM

## 2018-03-02 DIAGNOSIS — I4891 Unspecified atrial fibrillation: Secondary | ICD-10-CM | POA: Diagnosis not present

## 2018-03-02 DIAGNOSIS — I10 Essential (primary) hypertension: Secondary | ICD-10-CM

## 2018-03-02 DIAGNOSIS — E785 Hyperlipidemia, unspecified: Secondary | ICD-10-CM

## 2018-03-02 DIAGNOSIS — I2510 Atherosclerotic heart disease of native coronary artery without angina pectoris: Secondary | ICD-10-CM | POA: Diagnosis not present

## 2018-03-02 LAB — BASIC METABOLIC PANEL
BUN / CREAT RATIO: 21 (ref 12–28)
BUN: 16 mg/dL (ref 8–27)
CALCIUM: 8.9 mg/dL (ref 8.7–10.3)
CHLORIDE: 106 mmol/L (ref 96–106)
CO2: 22 mmol/L (ref 20–29)
CREATININE: 0.75 mg/dL (ref 0.57–1.00)
GFR calc Af Amer: 87 mL/min/{1.73_m2} (ref >59)
GFR calc non Af Amer: 76 mL/min/{1.73_m2} (ref >59)
GLUCOSE: 120 mg/dL — AB (ref 65–99)
Potassium: 4.2 mmol/L (ref 3.5–5.2)
Sodium: 145 mmol/L — ABNORMAL HIGH (ref 134–144)

## 2018-03-02 MED ORDER — FUROSEMIDE 40 MG PO TABS: 40.0000 mg | ORAL_TABLET | Freq: Two times a day (BID) | ORAL | 3 refills | Status: DC

## 2018-03-02 MED ORDER — POTASSIUM CHLORIDE ER 10 MEQ PO TBCR: 10.0000 meq | EXTENDED_RELEASE_TABLET | Freq: Two times a day (BID) | ORAL | 3 refills | Status: DC

## 2018-03-02 MED ORDER — POTASSIUM CHLORIDE ER 10 MEQ PO TBCR: 10.0000 meq | EXTENDED_RELEASE_TABLET | Freq: Every day | ORAL | 3 refills | Status: DC

## 2018-03-02 MED ORDER — PINDOLOL 5 MG PO TABS: ORAL_TABLET | ORAL | 3 refills | Status: DC

## 2018-03-02 MED ORDER — AMIODARONE HCL 200 MG PO TABS: 200.0000 mg | ORAL_TABLET | Freq: Two times a day (BID) | ORAL | 3 refills | Status: DC

## 2018-03-02 NOTE — Patient Instructions (Addendum)
Medication Instructions:  Your physician has recommended you make the following change in your medication:  1. START AMIODARONE 200 MG TWICE DAILY.  2. INCREASE LASIX TO 40 MG TWICE DAILY.  3. INCREASE POTASSIUM TO 10 MEQ  DAILY.  4. INCREASE PINDOLOL TO 10 MG IN THE AM AND 5 MG IN THE PM.  If you need a refill on your cardiac medications before your next appointment, please call your pharmacy.   Lab work: 1. TODAY: BMET  2. TO BE DONE IN 1 WEEK: BMET  If you have labs (blood work) drawn today and your tests are completely normal, you will receive your results only by: Marland Kitchen MyChart Message (if you have MyChart) OR . A paper copy in the mail If you have any lab test that is abnormal or we need to change your treatment, we will call you to review the results.  Testing/Procedures: NONE  Follow-Up: At [facility], you and your health needs are our priority.  As part of our continuing mission to provide you with exceptional heart care, we have created designated Provider Care Teams.  These Care Teams include your primary Cardiologist (physician) and Advanced Practice Providers (APPs -  Physician Assistants and Nurse Practitioners) who all work together to provide you with the care you need, when you need it. You will need a follow up appointment in:  3-4 weeks. You may see Dorris Carnes, MD or Richardson Dopp, PA-C Your physician recommends that you schedule a follow-up appointment in: [place] EKG WHEN DR ROSS IS IN THE OFFICE.     Any Other Special Instructions Will Be Listed Below (If Applicable).

## 2018-03-03 NOTE — Telephone Encounter (Signed)
See message from patient. Stop Amiodarone. Refer to AFib clinic.  See if she can be seen this week or next.  She may be a candidate for Tikosyn but will leave up to AFib clinic. I will forward this to Dr. Harrington Challenger as an Juluis Rainier. Keep other follow up as planned.Richardson Dopp, PA-C    03/03/2018 4:35 PM

## 2018-03-05 NOTE — Telephone Encounter (Signed)
Please see note from patient. She was able to take the Amiodarone with food and avoid diarrhea.  Therefore, we can cancel the referral to the AFib Clinic. She will continue on Amiodarone and follow up Monday for an ECG with the nurse. Richardson Dopp, PA-C    03/05/2018 9:17 AM

## 2018-03-09 ENCOUNTER — Ambulatory Visit (INDEPENDENT_AMBULATORY_CARE_PROVIDER_SITE_OTHER): Payer: Medicare Other

## 2018-03-09 ENCOUNTER — Other Ambulatory Visit: Payer: Medicare Other | Admitting: *Deleted

## 2018-03-09 DIAGNOSIS — I10 Essential (primary) hypertension: Secondary | ICD-10-CM | POA: Diagnosis not present

## 2018-03-09 DIAGNOSIS — I4891 Unspecified atrial fibrillation: Secondary | ICD-10-CM

## 2018-03-09 DIAGNOSIS — Z950 Presence of cardiac pacemaker: Secondary | ICD-10-CM

## 2018-03-09 DIAGNOSIS — I2510 Atherosclerotic heart disease of native coronary artery without angina pectoris: Secondary | ICD-10-CM

## 2018-03-09 DIAGNOSIS — I5033 Acute on chronic diastolic (congestive) heart failure: Secondary | ICD-10-CM

## 2018-03-09 DIAGNOSIS — E785 Hyperlipidemia, unspecified: Secondary | ICD-10-CM

## 2018-03-09 LAB — BASIC METABOLIC PANEL
BUN / CREAT RATIO: 35 — AB (ref 12–28)
BUN: 39 mg/dL — AB (ref 8–27)
CHLORIDE: 103 mmol/L (ref 96–106)
CO2: 23 mmol/L (ref 20–29)
Calcium: 8.5 mg/dL — ABNORMAL LOW (ref 8.7–10.3)
Creatinine, Ser: 1.11 mg/dL — ABNORMAL HIGH (ref 0.57–1.00)
GFR calc Af Amer: 54 mL/min/{1.73_m2} — ABNORMAL LOW (ref >59)
GFR calc non Af Amer: 47 mL/min/{1.73_m2} — ABNORMAL LOW (ref >59)
GLUCOSE: 129 mg/dL — AB (ref 65–99)
Potassium: 4.1 mmol/L (ref 3.5–5.2)
SODIUM: 141 mmol/L (ref 134–144)

## 2018-03-09 MED ORDER — FUROSEMIDE 80 MG PO TABS: 80.0000 mg | ORAL_TABLET | Freq: Every day | ORAL | 3 refills | Status: DC

## 2018-03-09 NOTE — Progress Notes (Signed)
1.) Reason for visit: EKG   2.) Name of MD requesting visit: Richardson Dopp PA   3.) H&P: Pt seen 03/02/18...added Amiodarone 200mg  bid  For AFIB with RVR.Marland Kitchenand scheduled for an EKG today.  4.) ROS related to problem:-  5.) Assessment and plan per MD: Pt still in Afib with rate 93.Marland Kitchen BP 124/70.Marland Kitchen pt denies palpitations but still c/o increased ankle edema.. Dr. Harrington Challenger saw the pt and changed her Lasix to 80mg  po every morning instead of 40mg  bid.. pt had BMET today and will call her with results after Dr. Harrington Challenger reviews.   Per Dr. Harrington Challenger.Marland Kitchen appt with Richardson Dopp PA moved up to 03/17/18.

## 2018-03-09 NOTE — Patient Instructions (Signed)
Medication Instructions:  Change lasix (furosemide) to 80mg  every morning.   If you need a refill on your cardiac medications before your next appointment, please call your pharmacy.   Lab work: BMET (already drawn) If you have labs (blood work) drawn today and your tests are completely normal, you will receive your results only by: Marland Kitchen MyChart Message (if you have MyChart) OR . A paper copy in the mail If you have any lab test that is abnormal or we need to change your treatment, we will call you to review the results.    Testing/Procedures:   Follow-Up: One week with Richardson Dopp PA or Dr. Dorris Carnes   At [facility], you and your health needs are our priority.  As part of our continuing mission to provide you with exceptional heart care, we have created designated Provider Care Teams.  These Care Teams include your primary Cardiologist (physician) and Advanced Practice Providers (APPs -  Physician Assistants and Nurse Practitioners) who all work together to provide you with the care you need, when you need it   Any Other Special Instructions Will Be Listed Below (If Applicable).

## 2018-03-10 ENCOUNTER — Telehealth: Payer: Self-pay

## 2018-03-10 DIAGNOSIS — I10 Essential (primary) hypertension: Secondary | ICD-10-CM

## 2018-03-10 DIAGNOSIS — I5033 Acute on chronic diastolic (congestive) heart failure: Secondary | ICD-10-CM

## 2018-03-10 DIAGNOSIS — I2510 Atherosclerotic heart disease of native coronary artery without angina pectoris: Secondary | ICD-10-CM

## 2018-03-10 DIAGNOSIS — I4891 Unspecified atrial fibrillation: Secondary | ICD-10-CM

## 2018-03-10 NOTE — Telephone Encounter (Signed)
Pt returned call and was made aware of  results and recommendation about lab work. Per Richardson Dopp, PA-C to decrease Lasix to 60 mg qd, obtain bmet in 1 week and follow up with PCP for low calcium. Lab orders are placed in epic and lab appt is made for same day pt sees Richardson Dopp, PA-C on 12/24. Pt states that she had a nurse visit with Alinda Dooms yesterday (refer to ov note 12/16). Pt states that Dr. Harrington Challenger told her to increase lasix to 80 mg every morning during nurse visit 12/16. Pt was confused and not sure whether to follow recommendations from [facility], PA-C to decrease lasix or Dr. Harrington Challenger to increase lasix. I made pt aware that I would talk with Nicki Reaper to see what he recommends and give her a call back. Pt states that she doesn't know what going on with her phone and why it is going straight to VM. Pt says to call her son, Lennette Bihari (DPR on file) at phone #: [phone] so that he can get in touch with pt. Pt verbalized understanding and thanked me for the results.

## 2018-03-10 NOTE — Telephone Encounter (Signed)
Labs indicate she is getting "a little dry".  Therefore, I think she should reduce the Lasix as I recommended.  Dr. Harrington Challenger did not have the lab results back yesterday when she recommended taking the Lasix all at once in the AM. Richardson Dopp, PA-C    03/10/2018 5:40 PM

## 2018-03-11 NOTE — Telephone Encounter (Signed)
Reviewed results with patient's son Lennette Bihari (DPR on file) who verbalized understanding. Pt's son states that he will call pt about recommendations per Richardson Dopp, PAC to decrease lasix to 60 mg qd.  Pt's son thanked me for the call.

## 2018-03-16 NOTE — Progress Notes (Signed)
Cardiology Office Note:    Date:  03/17/2018   ID:  Meredith Gonzalez, DOB 1935/11/24, MRN [id number]  PCP:  Meredith Ship, MD  Cardiologist:  Dorris Carnes, MD   Electrophysiologist:  Cristopher Peru, MD   Referring MD: Meredith Ship, MD   Chief Complaint  Patient presents with  . Follow-up    CHF, AFib     History of Present Illness:    Meredith Gonzalez is a 82 y.o. female with AFib, s/p fall resulting in subdural hematoma, HTN, hypothyroidism, s/p pacemaker (RV lead revision done in 2014), coronary artery disease s/p NSTEMI in 4/18 treated with a DES to the Dx, combined systolic and diastolic CHF, syncope felt to be related to autonomic dysfunction. She was previously not felt to be a candidate for anticoagulationdue to prior falls with subdural hematoma. Prior tilt table test was positive and she was changed from Diltiazem to Pindolol. She had a fall and fractured her c-spine in 12/2017.    She was admitted in 01/2018 with AFib with RVR complicated by decompensated congestive heart failure.  She was placed on Apixaban and underwent TEE guided cardioversion.  At her follow up on 03/02/18, she was again volume overloaded and she was back in AFib with RVR.  I increased her Pindolol and her Lasix.  I put her on Amiodarone with an eye toward DCCV after adequate Amiodarone load.    Meredith Gonzalez returns for follow-up.  She is here alone.  Her weight is down about 7 pounds.  Her swelling has improved.  Her legs remain significantly itchy.  She is having a hard time not scratching.  She has multiple open wounds on her lower extremities.  These tend to ooze serous fluid at times.  She denies orthopnea, chest pain, syncope.  Prior CV studies:   The following studies were reviewed today:  TEE 02/13/18 EF 50-55, no RWMA, mild MR, mod LAE, mild RV dilation, mild RAE, atrial septal lipomatous hypertrophy, no R-L atrial level shunt, mod TR, mild PI  Echo 02/11/18 EF 50-55, mild MR, mod LAE, mod RV dilation, mod  RAE, severe TR, PASP 49  LHC 07/19/16 LM normal LAD okay, D1 95 LCx normal RCA proximal 20 EF 35-45 LVEDP 20 PCI: 2.5 x 16 mm Promus DES to the D1  Limited echo 07/17/16 EF 50, apical anterior, apical septal and true apical HK, MAC, trivial MR, mild LAE  Echo 07/14/16 Apical, apical inferolateral, apical anterior, apical lateral HK, normal LV function, grade 2 diastolic dysfunction, trivial MR, mild LAE, PASP 50  Myoview 07/16/16 Anteroapical infarct with peri-infarct ischemia, EF 38, high risk  Past Medical History:  Diagnosis Date  . Anemia   . Anxiety   . Arthritis    "all over" (02/11/2018)  . Basal cell carcinoma    "left leg" (02/11/2018)  . Cervical spine fracture ([place]) 12/2017   "C1-2"  . CHF (congestive heart failure) ([place])   . Chronic bronchitis ([place])   . Chronic neck pain    "since I broke my neck 6-8 wk ago" (02/11/2018)  . Diabetic peripheral neuropathy ([place])   . Fibromyalgia   . Fracture of right humerus   . Generalized weakness   . Headache    "weekly" (02/11/2018)  . History of blood transfusion    "related to one of my femur surgeries" (02/11/2018)  . History of echocardiogram    Echo 8/18: EF 50-55, no RWMA, Gr 1 DD, calcified AV leaflets, MAC, trivial MR, mod LAE, PASP 37  .  Hypertension   . Hypothyroidism   . Incontinence of urine   . Ischemic stroke ([place]) 2000   "lost part of the vision in my right eye" (02/11/2018)  . Major depression, chronic   . Myocardial infarction ([place]) 06/2016  . Pacemaker   . Recurrent falls   . Syncope and collapse   . Type 2 diabetes, diet controlled ([place])    Surgical Hx: The patient  has a past surgical history that includes Abdominal hysterectomy; Femur fracture surgery (Bilateral); Carpal tunnel release; Gastric bypass; Eye surgery; Colonoscopy; Lead revision (N/A, 01/01/2013); LEFT HEART CATH AND CORONARY ANGIOGRAPHY (N/A, 07/19/2016); CORONARY STENT INTERVENTION (N/A, 07/19/2016); Excision basal cell carcinoma  (Left); Ankle fracture surgery (Right); Tonsillectomy; Cholecystectomy open; Fracture surgery; Cataract extraction w/ intraocular lens  implant, bilateral (Bilateral); Insert / replace / remove pacemaker (~ 2012); Ovarian cyst surgery; Coronary angioplasty with stent (06/2016); Cardiac catheterization (~ 2014); TEE without cardioversion (N/A, 02/13/2018); and Cardioversion (N/A, 02/13/2018).   Current Medications: Current Meds  Medication Sig  . acetaminophen (TYLENOL) 500 MG tablet Take 500 mg by mouth every 6 (six) hours as needed for moderate pain.   Marland Kitchen albuterol (PROVENTIL HFA;VENTOLIN HFA) 108 (90 Base) MCG/ACT inhaler Inhale 2 puffs into the lungs every 6 (six) hours as needed for wheezing or shortness of breath.   Marland Kitchen amiodarone (PACERONE) 200 MG tablet Take 1 tablet (200 mg total) by mouth 2 (two) times daily.  Marland Kitchen ammonium lactate (LAC-HYDRIN) 12 % lotion Apply 1 application topically daily. In the morning after bathing  . apixaban (ELIQUIS) 5 MG TABS tablet Take 1 tablet (5 mg total) by mouth 2 (two) times daily. Prescription for free 30 day supply  . Calcium Citrate-Vitamin D3 315-250 MG-UNIT TABS Take 1 tablet by mouth 2 (two) times daily.  . diphenhydrAMINE (BENADRYL ALLERGY) 25 MG tablet Take 25 mg by mouth every 8 (eight) hours as needed (itching/allergies.).   Marland Kitchen ferrous sulfate 325 (65 FE) MG tablet Take 1 tablet (325 mg total) by mouth 2 (two) times daily with a meal.  . flintstones complete (FLINTSTONES) 60 MG chewable tablet Chew 1 tablet by mouth daily.  Marland Kitchen FLUoxetine (PROZAC) 20 MG capsule Take 60 mg by mouth daily.   . fluticasone (FLONASE) 50 MCG/ACT nasal spray Place 1 spray into both nostrils daily as needed (allergies.).   Marland Kitchen furosemide (LASIX) 20 MG tablet Take 20 mg by mouth daily.  . furosemide (LASIX) 40 MG tablet Take 40 mg by mouth daily.  Marland Kitchen gabapentin (NEURONTIN) 300 MG capsule Take 300-600 mg by mouth See admin instructions. Take 1 capsule (300 mg) by mouth in the  mornning & take 2 capsules (600 mg) by mouth at night  . Melatonin 10 MG TABS Take 40 mg by mouth at bedtime.  . Multiple Vitamins-Minerals (MULTIVITAMIN ADULT) TABS Take 1 tablet by mouth daily.  . nitroGLYCERIN (NITROSTAT) 0.4 MG SL tablet Place 1 tablet (0.4 mg total) under the tongue every 5 (five) minutes as needed for chest pain.  . pantoprazole (PROTONIX) 40 MG tablet Take 1 tablet (40 mg total) by mouth daily. Can switch to any PPI  . pindolol (VISKEN) 5 MG tablet Take 10 mg in the AM and 5 mg in the PM daily by mouth (Patient taking differently: Take 5-10 mg by mouth See admin instructions. Take 2 tablets (10 mg) by mouth in the morning & take 1 tablet (5 mg) by mouth in the evening)  . potassium chloride (K-DUR) 10 MEQ tablet Take 1 tablet (10 mEq  total) by mouth daily.  . pramipexole (MIRAPEX) 0.25 MG tablet Take 0.25 mg by mouth every morning.  . vitamin B-12 (CYANOCOBALAMIN) 1000 MCG tablet Take 1 tablet (1,000 mcg total) by mouth daily.  . [DISCONTINUED] amitriptyline (ELAVIL) 25 MG tablet Take 25 mg by mouth at bedtime.  . [DISCONTINUED] furosemide (LASIX) 80 MG tablet Take 1 tablet (80 mg total) by mouth daily.     Allergies:   Valium [diazepam]; Codeine; Darifenacin; Darvon [propoxyphene hcl]; Daypro [oxaprozin]; Enablex [darifenacin hydrobromide er]; Oxycodone; Propoxyphene; Risperdal [risperidone]; Talwin [pentazocine]; and Vicodin [hydrocodone-acetaminophen]   Social History   Tobacco Use  . Smoking status: Former Smoker    Packs/day: 0.50    Years: 4.00    Pack years: 2.00    Types: Cigarettes    Last attempt to quit: 08/07/1972    Years since quitting: 45.6  . Smokeless tobacco: Never Used  Substance Use Topics  . Alcohol use: Yes    Frequency: Never    Comment: 02/11/2018 "couple drinks/year; if that"  . Drug use: Never     Family Hx: The patient's family history includes Alcoholism in her brother, brother, father, and sister; Congestive Heart Failure in her  mother; Heart attack in her father; Throat cancer in her brother.  ROS:   Please see the history of present illness.    Review of Systems  Cardiovascular: Positive for dyspnea on exertion.  Skin: Positive for itching and rash.  Gastrointestinal: Positive for diarrhea.  Neurological: Positive for headaches and loss of balance.   All other systems reviewed and are negative.   EKGs/Labs/Other Test Reviewed:    EKG:  EKG is  ordered today.  The ekg ordered today demonstrates atrial fibrillation, HR 99, intermittent V pacing  Recent Labs: 02/10/2018: ALT 19; B Natriuretic Peptide 550.4; Magnesium 2.1; TSH 8.636 02/13/2018: Hemoglobin 8.9; Platelets 207 03/09/2018: BUN 39; Creatinine, Ser 1.11; Potassium 4.1; Sodium 141   Recent Lipid Panel Lab Results  Component Value Date/Time   CHOL 95 (L) 03/20/2017 07:28 AM   TRIG 45 03/20/2017 07:28 AM   HDL 58 03/20/2017 07:28 AM   CHOLHDL 1.6 03/20/2017 07:28 AM   CHOLHDL 2.7 07/18/2016 03:49 AM   LDLCALC 28 03/20/2017 07:28 AM    Physical Exam:    VS:  BP 122/70   Pulse 99   Ht 5' 5"  (1.651 m)   Wt 131 lb (59.4 kg)   SpO2 96%   BMI 21.80 kg/m     Wt Readings from Last 3 Encounters:  03/17/18 131 lb (59.4 kg)  03/09/18 136 lb (61.7 kg)  03/02/18 138 lb 1.9 oz (62.7 kg)     Physical Exam  Constitutional: She is oriented to person, place, and time. She appears well-developed and well-nourished. No distress.  HENT:  Head: Normocephalic and atraumatic.  Eyes: No scleral icterus.  Neck: No JVD present. No thyromegaly present.  Cardiovascular: Normal rate and regular rhythm.  No murmur heard. Pulmonary/Chest: She has no rales.  Abdominal: Soft. There is no abdominal tenderness.  Musculoskeletal:        General: Edema (trace-1+ tight bilat LE edema) present.  Lymphadenopathy:    She has no cervical adenopathy.  Neurological: She is alert and oriented to person, place, and time.  Skin: Skin is warm and dry.  Multiple  excoriations to bilat LE   Psychiatric: She has a normal mood and affect.    ASSESSMENT & PLAN:    Persistent atrial fibrillation She remains in atrial fibrillation.  Her heart  rate has improved since starting on amiodarone and increasing her pindolol.  She is somewhat asymptomatic.  We have decided on a rhythm control strategy given her prior history of decompensated congestive heart failure in the setting of atrial fibrillation with rapid rate.  She has been loaded with approximately 6 g of amiodarone since initiation.  I have recommended that we go ahead and proceed with cardioversion next week, after adequate loading.  -Continue current dose of pindolol, amiodarone  -She remains on Apixaban with good adherence  -Arrange elective cardioversion next Friday, 03/28/2018  -Arrange 2-week follow-up post cardioversion  Chronic heart failure with preserved ejection fraction (HCC) Volume status has improved since she was last seen.  Her leg swelling has improved significantly.  She continues to have pruritus of her lower extremities.  She has difficulty not scratching and has multiple excoriations.  She does have a home health nurse coming out to help with wound care.  I have asked her to try to use some 1% hydrocortisone cream and Benadryl to help cut down on the pruritus.  Continue current dose of Lasix.  Follow-up BMET has already been obtained today.  If her renal function has worsened, I will reduce her Lasix further.  Coronary artery disease involving native coronary artery of native heart without angina pectoris History of non-ST elevation myocardial infarction in 2018 treated with drug-eluting stent to the diagonal.  She denies anginal symptoms.  She is not on aspirin as she is on Apixaban.  Continue beta-blocker, statin.  Essential hypertension The patient's blood pressure is controlled on her current regimen.  Continue current therapy.    Dispo:  Return in about 4 weeks (around 04/14/2018)  for Post Procedure Follow Up, w/ Dr. Harrington Challenger, or Richardson Dopp, PA-C.   Medication Adjustments/Labs and Tests Ordered: Current medicines are reviewed at length with the patient today.  Concerns regarding medicines are outlined above.  Tests Ordered: Orders Placed This Encounter  Procedures  . CBC   Medication Changes: No orders of the defined types were placed in this encounter.   Signed, Richardson Dopp, PA-C  03/17/2018 12:33 PM    [facility] [address], [place], [place]  [address] Phone: [phone]; Fax: [phone]

## 2018-03-17 ENCOUNTER — Encounter: Payer: Self-pay | Admitting: Physician Assistant

## 2018-03-17 ENCOUNTER — Ambulatory Visit (INDEPENDENT_AMBULATORY_CARE_PROVIDER_SITE_OTHER): Payer: Medicare Other | Admitting: Physician Assistant

## 2018-03-17 ENCOUNTER — Other Ambulatory Visit: Payer: Medicare Other | Admitting: *Deleted

## 2018-03-17 VITALS — BP 122/70 | HR 99 | Ht 65.0 in | Wt 131.0 lb

## 2018-03-17 DIAGNOSIS — I10 Essential (primary) hypertension: Secondary | ICD-10-CM

## 2018-03-17 DIAGNOSIS — I4891 Unspecified atrial fibrillation: Secondary | ICD-10-CM

## 2018-03-17 DIAGNOSIS — I2510 Atherosclerotic heart disease of native coronary artery without angina pectoris: Secondary | ICD-10-CM

## 2018-03-17 DIAGNOSIS — I4819 Other persistent atrial fibrillation: Secondary | ICD-10-CM | POA: Diagnosis not present

## 2018-03-17 DIAGNOSIS — I5032 Chronic diastolic (congestive) heart failure: Secondary | ICD-10-CM

## 2018-03-17 DIAGNOSIS — I255 Ischemic cardiomyopathy: Secondary | ICD-10-CM | POA: Diagnosis not present

## 2018-03-17 DIAGNOSIS — I5033 Acute on chronic diastolic (congestive) heart failure: Secondary | ICD-10-CM

## 2018-03-17 LAB — CBC
HEMOGLOBIN: 9.9 g/dL — AB (ref 11.1–15.9)
Hematocrit: 31.3 % — ABNORMAL LOW (ref 34.0–46.6)
MCH: 28.8 pg (ref 26.6–33.0)
MCHC: 31.6 g/dL (ref 31.5–35.7)
MCV: 91 fL (ref 79–97)
Platelets: 254 10*3/uL (ref 150–450)
RBC: 3.44 x10E6/uL — ABNORMAL LOW (ref 3.77–5.28)
RDW: 13.9 % (ref 12.3–15.4)
WBC: 7.3 10*3/uL (ref 3.4–10.8)

## 2018-03-17 LAB — BASIC METABOLIC PANEL
BUN / CREAT RATIO: 26 (ref 12–28)
BUN: 29 mg/dL — ABNORMAL HIGH (ref 8–27)
CO2: 26 mmol/L (ref 20–29)
Calcium: 8.4 mg/dL — ABNORMAL LOW (ref 8.7–10.3)
Chloride: 95 mmol/L — ABNORMAL LOW (ref 96–106)
Creatinine, Ser: 1.12 mg/dL — ABNORMAL HIGH (ref 0.57–1.00)
GFR calc non Af Amer: 47 mL/min/{1.73_m2} — ABNORMAL LOW (ref >59)
GFR, EST AFRICAN AMERICAN: 54 mL/min/{1.73_m2} — AB (ref >59)
GLUCOSE: 123 mg/dL — AB (ref 65–99)
Potassium: 3.3 mmol/L — ABNORMAL LOW (ref 3.5–5.2)
SODIUM: 138 mmol/L (ref 134–144)

## 2018-03-17 NOTE — Patient Instructions (Addendum)
Medication Instructions:  Your physician recommends that you continue on your current medications as directed. Please refer to the Current Medication list given to you today.  If you need a refill on your cardiac medications before your next appointment, please call your pharmacy.   Lab work: TODAY: BMET, CBC If you have labs (blood work) drawn today and your tests are completely normal, you will receive your results only by: Marland Kitchen MyChart Message (if you have MyChart) OR . A paper copy in the mail If you have any lab test that is abnormal or we need to change your treatment, we will call you to review the results.  Testing/Procedures: Your physician has recommended that you have a Cardioversion (DCCV). Electrical Cardioversion uses a jolt of electricity to your heart either through paddles or wired patches attached to your chest. This is a controlled, usually prescheduled, procedure. Defibrillation is done under light anesthesia in the hospital, and you usually go home the day of the procedure. This is done to get your heart back into a normal rhythm. You are not awake for the procedure. Please see the instruction sheet given to you today.  Please arrive at [facility] at 9:00am on 03/27/18. Procedure is scheduled for 10:30. Report to Short Stay Do not eat or drink after midnight the night prior to procedure Okay to take medications the morning of the procedure with sip of water but HOLD Furosemide You will need someone to drive you home after the procedure    Follow-Up: At [facility], you and your health needs are our priority.  As part of our continuing mission to provide you with exceptional heart care, we have created designated Provider Care Teams.  These Care Teams include your primary Cardiologist (physician) and Advanced Practice Providers (APPs -  Physician Assistants and Nurse Practitioners) who all work together to provide you with the care you need, when you need  it. . Your physician recommends that you schedule a follow-up appointment with [facility], PA-C ON 1/17 @ 11:15 AM   Any Other Special Instructions Will Be Listed Below (If Applicable).

## 2018-03-19 ENCOUNTER — Ambulatory Visit (INDEPENDENT_AMBULATORY_CARE_PROVIDER_SITE_OTHER): Payer: Medicare Other

## 2018-03-19 ENCOUNTER — Telehealth: Payer: Self-pay

## 2018-03-19 DIAGNOSIS — I255 Ischemic cardiomyopathy: Secondary | ICD-10-CM | POA: Diagnosis not present

## 2018-03-19 DIAGNOSIS — E876 Hypokalemia: Secondary | ICD-10-CM

## 2018-03-19 MED ORDER — POTASSIUM CHLORIDE ER 10 MEQ PO TBCR: 20.0000 meq | EXTENDED_RELEASE_TABLET | Freq: Every day | ORAL | 3 refills | Status: DC

## 2018-03-19 NOTE — Telephone Encounter (Signed)
Notes recorded by Frederik Schmidt, RN on 03/19/2018 at 2:28 PM EST [email or web address] 12/26 @ 2:27pm informing the patient that is important that we talk to her about her potassium. ------

## 2018-03-19 NOTE — Progress Notes (Signed)
Remote pacemaker transmission.   

## 2018-03-19 NOTE — Telephone Encounter (Signed)
Notes recorded by Frederik Schmidt, RN on 03/19/2018 at 2:44 PM EST The patient's son Lennette Bihari) has been notified of the result and verbalized understanding. He will instruct his mother of Scott's recommendations. We have arranged labs (BMET) for 1/2. All questions (if any) were answered. Frederik Schmidt, RN 03/19/2018 2:43 PM   ------

## 2018-03-19 NOTE — Telephone Encounter (Signed)
-----   Message from Liliane Shi, PA-C sent at 03/19/2018  8:33 AM EST ----- Creatinine stable.  K+ low.  Glucose high.  Calcium low. Recommendations:  - Take K+ 40 mEq x 1 today  - Increase K+ to 20 mEq QD (starting tomorrow)  - BMET 1 week Richardson Dopp, PA-C    03/19/2018 8:31 AM

## 2018-03-19 NOTE — Telephone Encounter (Signed)
Notes recorded by Frederik Schmidt, RN on 03/19/2018 at 8:54 AM EST [email or web address] 12/26 ------

## 2018-03-26 ENCOUNTER — Other Ambulatory Visit: Payer: Medicare Other | Admitting: *Deleted

## 2018-03-26 DIAGNOSIS — E876 Hypokalemia: Secondary | ICD-10-CM

## 2018-03-26 LAB — BASIC METABOLIC PANEL
BUN/Creatinine Ratio: 22 (ref 12–28)
BUN: 22 mg/dL (ref 8–27)
CO2: 23 mmol/L (ref 20–29)
Calcium: 8.4 mg/dL — ABNORMAL LOW (ref 8.7–10.3)
Chloride: 102 mmol/L (ref 96–106)
Creatinine, Ser: 0.99 mg/dL (ref 0.57–1.00)
GFR calc Af Amer: 62 mL/min/{1.73_m2} (ref >59)
GFR calc non Af Amer: 54 mL/min/{1.73_m2} — ABNORMAL LOW (ref >59)
Glucose: 105 mg/dL — ABNORMAL HIGH (ref 65–99)
Potassium: 4.1 mmol/L (ref 3.5–5.2)
SODIUM: 138 mmol/L (ref 134–144)

## 2018-03-26 NOTE — Addendum Note (Signed)
Addended by: Jacinta Shoe on: 03/26/2018 07:59 AM   Modules accepted: Orders

## 2018-03-27 ENCOUNTER — Ambulatory Visit (HOSPITAL_COMMUNITY)
Admission: RE | Admit: 2018-03-27 | Discharge: 2018-03-27 | Disposition: A | Discharge status: Home / Self Care | Payer: Medicare Other | Attending: Cardiology | Admitting: Cardiology

## 2018-03-27 ENCOUNTER — Ambulatory Visit (HOSPITAL_COMMUNITY): Payer: Medicare Other | Admitting: Registered Nurse

## 2018-03-27 ENCOUNTER — Encounter (HOSPITAL_COMMUNITY): Payer: Self-pay | Admitting: *Deleted

## 2018-03-27 ENCOUNTER — Encounter (HOSPITAL_COMMUNITY)
Admission: RE | Disposition: A | Discharge status: Home / Self Care | Payer: Self-pay | Source: Home / Self Care | Attending: Cardiology

## 2018-03-27 ENCOUNTER — Other Ambulatory Visit: Payer: Self-pay

## 2018-03-27 DIAGNOSIS — Z7951 Long term (current) use of inhaled steroids: Secondary | ICD-10-CM | POA: Insufficient documentation

## 2018-03-27 DIAGNOSIS — I4819 Other persistent atrial fibrillation: Secondary | ICD-10-CM | POA: Diagnosis not present

## 2018-03-27 DIAGNOSIS — I252 Old myocardial infarction: Secondary | ICD-10-CM | POA: Diagnosis not present

## 2018-03-27 DIAGNOSIS — E039 Hypothyroidism, unspecified: Secondary | ICD-10-CM | POA: Insufficient documentation

## 2018-03-27 DIAGNOSIS — I2510 Atherosclerotic heart disease of native coronary artery without angina pectoris: Secondary | ICD-10-CM | POA: Insufficient documentation

## 2018-03-27 DIAGNOSIS — I110 Hypertensive heart disease with heart failure: Secondary | ICD-10-CM | POA: Diagnosis not present

## 2018-03-27 DIAGNOSIS — I5042 Chronic combined systolic (congestive) and diastolic (congestive) heart failure: Secondary | ICD-10-CM | POA: Insufficient documentation

## 2018-03-27 DIAGNOSIS — M1990 Unspecified osteoarthritis, unspecified site: Secondary | ICD-10-CM | POA: Insufficient documentation

## 2018-03-27 DIAGNOSIS — Z9181 History of falling: Secondary | ICD-10-CM | POA: Diagnosis not present

## 2018-03-27 DIAGNOSIS — Z9884 Bariatric surgery status: Secondary | ICD-10-CM | POA: Insufficient documentation

## 2018-03-27 DIAGNOSIS — F419 Anxiety disorder, unspecified: Secondary | ICD-10-CM | POA: Diagnosis not present

## 2018-03-27 DIAGNOSIS — Z950 Presence of cardiac pacemaker: Secondary | ICD-10-CM | POA: Diagnosis not present

## 2018-03-27 DIAGNOSIS — M797 Fibromyalgia: Secondary | ICD-10-CM | POA: Diagnosis not present

## 2018-03-27 DIAGNOSIS — Z87891 Personal history of nicotine dependence: Secondary | ICD-10-CM | POA: Insufficient documentation

## 2018-03-27 DIAGNOSIS — Z8673 Personal history of transient ischemic attack (TIA), and cerebral infarction without residual deficits: Secondary | ICD-10-CM | POA: Diagnosis not present

## 2018-03-27 DIAGNOSIS — Z7901 Long term (current) use of anticoagulants: Secondary | ICD-10-CM | POA: Insufficient documentation

## 2018-03-27 DIAGNOSIS — Z955 Presence of coronary angioplasty implant and graft: Secondary | ICD-10-CM | POA: Diagnosis not present

## 2018-03-27 DIAGNOSIS — E1142 Type 2 diabetes mellitus with diabetic polyneuropathy: Secondary | ICD-10-CM | POA: Insufficient documentation

## 2018-03-27 DIAGNOSIS — F329 Major depressive disorder, single episode, unspecified: Secondary | ICD-10-CM | POA: Diagnosis not present

## 2018-03-27 HISTORY — PX: CARDIOVERSION: SHX1299

## 2018-03-27 SURGERY — CARDIOVERSION
Anesthesia: Monitor Anesthesia Care

## 2018-03-27 MED ORDER — PROPOFOL 10 MG/ML IV BOLUS
INTRAVENOUS | Status: DC | PRN
  Administered 2018-03-27: 30 mg via INTRAVENOUS
  Administered 2018-03-27: 20 mg via INTRAVENOUS

## 2018-03-27 MED ORDER — SODIUM CHLORIDE 0.9 % IV SOLN
INTRAVENOUS | Status: DC
  Administered 2018-03-27: 10:00:00 via INTRAVENOUS

## 2018-03-27 MED ORDER — LIDOCAINE 2% (20 MG/ML) 5 ML SYRINGE
INTRAMUSCULAR | Status: DC | PRN
  Administered 2018-03-27: 40 mg via INTRAVENOUS

## 2018-03-27 NOTE — Interval H&P Note (Signed)
History and Physical Interval Note:  03/27/2018 9:41 AM  [name]  has presented today for surgery, with the diagnosis of a fib  The various methods of treatment have been discussed with the patient and family. After consideration of risks, benefits and other options for treatment, the patient has consented to  Procedure(s): CARDIOVERSION (N/A) as a surgical intervention .  The patient's history has been reviewed, patient examined, no change in status, stable for surgery.  I have reviewed the patient's chart and labs.  Questions were answered to the patient's satisfaction.     Kirk Ruths

## 2018-03-27 NOTE — Procedures (Signed)
Electrical Cardioversion Procedure Note Christien Berthelot [id number] 1935/08/16  Procedure: Electrical Cardioversion Indications:  Atrial Fibrillation  Procedure Details Consent: Risks of procedure as well as the alternatives and risks of each were explained to the (patient/caregiver).  Consent for procedure obtained. Time Out: Verified patient identification, verified procedure, site/side was marked, verified correct patient position, special equipment/implants available, medications/allergies/relevent history reviewed, required imaging and test results available.  Performed  Patient placed on cardiac monitor, pulse oximetry, supplemental oxygen as necessary.  Sedation given: Pt sedated by anesthesia with lidocaine 40 mg and diprovan 50 mg IV. Pacer pads placed anterior and posterior chest.  Cardioverted 1 time(s).  Cardioverted at 120J.  Evaluation Findings: Post procedure EKG shows: NSR Complications: None Patient did tolerate procedure well.   Kirk Ruths 03/27/2018, 9:24 AM

## 2018-03-27 NOTE — Anesthesia Procedure Notes (Signed)
Date/Time: 03/27/2018 9:57 AM Performed by: Trinna Post., CRNA Pre-anesthesia Checklist: Patient identified, Emergency Drugs available, Suction available, Patient being monitored and Timeout performed Patient Re-evaluated:Patient Re-evaluated prior to induction Oxygen Delivery Method: Ambu bag Preoxygenation: Pre-oxygenation with 100% oxygen Induction Type: IV induction Placement Confirmation: positive ETCO2

## 2018-03-27 NOTE — Transfer of Care (Signed)
Immediate Anesthesia Transfer of Care Note  Patient: Meredith Gonzalez  Procedure(s) Performed: CARDIOVERSION (N/A )  Patient Location: PACU and Endoscopy Unit  Anesthesia Type:MAC  Level of Consciousness: awake, alert  and oriented  Airway & Oxygen Therapy: Patient Spontanous Breathing  Post-op Assessment: Report given to RN and Post -op Vital signs reviewed and stable  Post vital signs: Reviewed and stable  Last Vitals:  Vitals Value Taken Time  BP    Temp    Pulse    Resp    SpO2      Last Pain:  Vitals:   03/27/18 0920  TempSrc: Oral  PainSc: 0-No pain         Complications: No apparent anesthesia complications

## 2018-03-27 NOTE — Anesthesia Preprocedure Evaluation (Signed)
Anesthesia Evaluation  Patient identified by MRN, date of birth, ID band Patient awake    Reviewed: Allergy & Precautions, NPO status , Patient's Chart, lab work & pertinent test results  Airway Mallampati: I  TM Distance: >3 FB Neck ROM: Full    Dental  (+) Upper Dentures, Lower Dentures, Dental Advisory Given   Pulmonary former smoker,    Pulmonary exam normal        Cardiovascular hypertension, + CAD, + Past MI and +CHF  + dysrhythmias Atrial Fibrillation + pacemaker  Rhythm:Irregular Rate:Normal     Neuro/Psych PSYCHIATRIC DISORDERS Anxiety Depression CVA    GI/Hepatic negative GI ROS, Neg liver ROS,   Endo/Other  diabetesHypothyroidism   Renal/GU negative Renal ROS  negative genitourinary   Musculoskeletal  (+) Arthritis , Fibromyalgia -  Abdominal Normal abdominal exam  (+)   Peds negative pediatric ROS (+)  Hematology  (+) Blood dyscrasia, ,   Anesthesia Other Findings   Reproductive/Obstetrics negative OB ROS                             Anesthesia Physical  Anesthesia Plan  ASA: III  Anesthesia Plan: General   Post-op Pain Management:    Induction:   PONV Risk Score and Plan: Ondansetron and Propofol infusion  Airway Management Planned: Natural Airway and Simple Face Mask  Additional Equipment:   Intra-op Plan:   Post-operative Plan:   Informed Consent: I have reviewed the patients History and Physical, chart, labs and discussed the procedure including the risks, benefits and alternatives for the proposed anesthesia with the patient or authorized representative who has indicated his/her understanding and acceptance.     Plan Discussed with: CRNA  Anesthesia Plan Comments:         Anesthesia Quick Evaluation

## 2018-03-27 NOTE — H&P (Signed)
Office Visit   03/17/2018 [facility] [address]    [place], [place], [place]  Cardiology   Persistent atrial fibrillation +3 more  Dx   Follow-up   ; Referred by Sinclair Ship, MD  Reason for Visit   Additional Documentation   Vitals:   BP 122/70   Pulse 99   Ht _0  (1.651 m)   Wt 59.4 kg   SpO2 96%   BMI 21.80 kg/m   BSA 1.65 m   Flowsheets:   MEWS Score,   Anthropometrics     Encounter Info:   Billing Info,   History,   Allergies,   Detailed Report     All Notes   Addendum Note by Jacinta Shoe, [place] at 03/17/2018 8:45 AM  Author: Jacinta Shoe, CMA Author Type: Certified Medical Assistant Filed: 03/26/2018 7:59 AM  Note Status: Signed Cosign: Cosign Not Required Encounter Date: 03/17/2018  Editor: Jacinta Shoe, [place] (Certified Medical Assistant)    Addended by: Jacinta Shoe on: 03/26/2018 07:59 AM   Modules accepted: Orders     Progress Notes by Liliane Shi, PA-C at 03/17/2018 8:45 AM  Author: Liliane Shi, PA-C Author Type: Physician Assistant Filed: 03/17/2018 12:33 PM  Note Status: Signed Cosign: Cosigned by Fay Records, MD at 03/20/2018 11:17 AM Encounter Date: 03/17/2018  Editor: Sharmon Revere (Physician Assistant)  Expand All Collapse All    Cardiology Office Note:    Date:  03/17/2018   ID:  Meredith Gonzalez, DOB [birth year], MRN [id number]  PCP:  Sinclair Ship, MD             Cardiologist:  Dorris Carnes, MD   Electrophysiologist:  Cristopher Peru, MD   Referring MD: Sinclair Ship, MD       Chief Complaint  Patient presents with  . Follow-up    CHF, AFib     History of Present Illness:    Meredith Gonzalez is a 83 y.o. female with AFib, s/p fall resulting in subdural hematoma, HTN, hypothyroidism, s/p pacemaker (RV lead revision done in 2014),coronary artery diseases/p NSTEMI in 4/18 treated with a DES to the Dx,combined systolic and diastolic CHF,syncope felt to be related to autonomic dysfunction. She was  previously not felt to be a candidate for anticoagulationdue to prior falls with subdural hematoma. Prior tilt table test was positive and she was changed from Diltiazem to Pindolol.She had a fall and fractured her c-spine in 12/2017.    She was admitted in 01/2018 with AFib with RVR complicated by decompensated congestive heart failure.  She was placed on Apixaban and underwent TEE guided cardioversion.  At her follow up on 03/02/18, she was again volume overloaded and she was back in AFib with RVR.  I increased her Pindolol and her Lasix.  I put her on Amiodarone with an eye toward DCCV after adequate Amiodarone load.    Ms. Gossett returns for follow-up.  She is here alone.  Her weight is down about 7 pounds.  Her swelling has improved.  Her legs remain significantly itchy.  She is having a hard time not scratching.  She has multiple open wounds on her lower extremities.  These tend to ooze serous fluid at times.  She denies orthopnea, chest pain, syncope.  Prior CV studies:   The following studies were reviewed today:  TEE 02/13/18 EF 50-55, no RWMA, mild MR, mod LAE, mild RV dilation, mild RAE, atrial septal lipomatous hypertrophy, no R-L atrial level shunt, mod TR, mild PI  Echo 02/11/18 EF 50-55, mild MR, mod LAE, mod RV dilation, mod RAE, severe TR, PASP 49  LHC 07/19/16 LM normal LAD okay, D1 95 LCx normal RCA proximal 20 EF 35-45 LVEDP 20 PCI: 2.5 x 16 mm Promus DES to the D1  Limited echo 07/17/16 EF 50, apical anterior, apical septal and true apical HK, MAC, trivial MR, mild LAE  Echo 07/14/16 Apical, apical inferolateral, apical anterior, apical lateral HK, normal LV function, grade 2 diastolic dysfunction, trivial MR, mild LAE, PASP 50  Myoview 07/16/16 Anteroapical infarct with peri-infarct ischemia, EF 38, high risk      Past Medical History:  Diagnosis Date  . Anemia   . Anxiety   . Arthritis    "all over" (02/11/2018)  . Basal cell carcinoma     "left leg" (02/11/2018)  . Cervical spine fracture ([place]) 12/2017   "C1-2"  . CHF (congestive heart failure) ([place])   . Chronic bronchitis ([place])   . Chronic neck pain    "since I broke my neck 6-8 wk ago" (02/11/2018)  . Diabetic peripheral neuropathy ([place])   . Fibromyalgia   . Fracture of right humerus   . Generalized weakness   . Headache    "weekly" (02/11/2018)  . History of blood transfusion    "related to one of my femur surgeries" (02/11/2018)  . History of echocardiogram    Echo 8/18: EF 50-55, no RWMA, Gr 1 DD, calcified AV leaflets, MAC, trivial MR, mod LAE, PASP 37  . Hypertension   . Hypothyroidism   . Incontinence of urine   . Ischemic stroke ([place]) 2000   "lost part of the vision in my right eye" (02/11/2018)  . Major depression, chronic   . Myocardial infarction ([place]) 06/2016  . Pacemaker   . Recurrent falls   . Syncope and collapse   . Type 2 diabetes, diet controlled ([place])    Surgical Hx: The patient  has a past surgical history that includes Abdominal hysterectomy; Femur fracture surgery (Bilateral); Carpal tunnel release; Gastric bypass; Eye surgery; Colonoscopy; Lead revision (N/A, 01/01/2013); LEFT HEART CATH AND CORONARY ANGIOGRAPHY (N/A, 07/19/2016); CORONARY STENT INTERVENTION (N/A, 07/19/2016); Excision basal cell carcinoma (Left); Ankle fracture surgery (Right); Tonsillectomy; Cholecystectomy open; Fracture surgery; Cataract extraction w/ intraocular lens  implant, bilateral (Bilateral); Insert / replace / remove pacemaker (~ 2012); Ovarian cyst surgery; Coronary angioplasty with stent (06/2016); Cardiac catheterization (~ 2014); TEE without cardioversion (N/A, 02/13/2018); and Cardioversion (N/A, 02/13/2018).   Current Medications: Active Medications      Current Meds  Medication Sig  . acetaminophen (TYLENOL) 500 MG tablet Take 500 mg by mouth every 6 (six) hours as needed for moderate pain.   Marland Kitchen albuterol (PROVENTIL HFA;VENTOLIN  HFA) 108 (90 Base) MCG/ACT inhaler Inhale 2 puffs into the lungs every 6 (six) hours as needed for wheezing or shortness of breath.   Marland Kitchen amiodarone (PACERONE) 200 MG tablet Take 1 tablet (200 mg total) by mouth 2 (two) times daily.  Marland Kitchen ammonium lactate (LAC-HYDRIN) 12 % lotion Apply 1 application topically daily. In the morning after bathing  . apixaban (ELIQUIS) 5 MG TABS tablet Take 1 tablet (5 mg total) by mouth 2 (two) times daily. Prescription for free 30 day supply  . Calcium Citrate-Vitamin D3 315-250 MG-UNIT TABS Take 1 tablet by mouth 2 (two) times daily.  . diphenhydrAMINE (BENADRYL ALLERGY) 25 MG tablet Take 25 mg by mouth every 8 (eight) hours as needed (itching/allergies.).   Marland Kitchen ferrous sulfate 325 (65 FE) MG tablet  Take 1 tablet (325 mg total) by mouth 2 (two) times daily with a meal.  . flintstones complete (FLINTSTONES) 60 MG chewable tablet Chew 1 tablet by mouth daily.  Marland Kitchen FLUoxetine (PROZAC) 20 MG capsule Take 60 mg by mouth daily.   . fluticasone (FLONASE) 50 MCG/ACT nasal spray Place 1 spray into both nostrils daily as needed (allergies.).   Marland Kitchen furosemide (LASIX) 20 MG tablet Take 20 mg by mouth daily.  . furosemide (LASIX) 40 MG tablet Take 40 mg by mouth daily.  Marland Kitchen gabapentin (NEURONTIN) 300 MG capsule Take 300-600 mg by mouth See admin instructions. Take 1 capsule (300 mg) by mouth in the mornning & take 2 capsules (600 mg) by mouth at night  . Melatonin 10 MG TABS Take 40 mg by mouth at bedtime.  . Multiple Vitamins-Minerals (MULTIVITAMIN ADULT) TABS Take 1 tablet by mouth daily.  . nitroGLYCERIN (NITROSTAT) 0.4 MG SL tablet Place 1 tablet (0.4 mg total) under the tongue every 5 (five) minutes as needed for chest pain.  . pantoprazole (PROTONIX) 40 MG tablet Take 1 tablet (40 mg total) by mouth daily. Can switch to any PPI  . pindolol (VISKEN) 5 MG tablet Take 10 mg in the AM and 5 mg in the PM daily by mouth (Patient taking differently: Take 5-10 mg by mouth See admin  instructions. Take 2 tablets (10 mg) by mouth in the morning & take 1 tablet (5 mg) by mouth in the evening)  . potassium chloride (K-DUR) 10 MEQ tablet Take 1 tablet (10 mEq total) by mouth daily.  . pramipexole (MIRAPEX) 0.25 MG tablet Take 0.25 mg by mouth every morning.  . vitamin B-12 (CYANOCOBALAMIN) 1000 MCG tablet Take 1 tablet (1,000 mcg total) by mouth daily.  . [DISCONTINUED] amitriptyline (ELAVIL) 25 MG tablet Take 25 mg by mouth at bedtime.  . [DISCONTINUED] furosemide (LASIX) 80 MG tablet Take 1 tablet (80 mg total) by mouth daily.       Allergies:   Valium [diazepam]; Codeine; Darifenacin; Darvon [propoxyphene hcl]; Daypro [oxaprozin]; Enablex [darifenacin hydrobromide er]; Oxycodone; Propoxyphene; Risperdal [risperidone]; Talwin [pentazocine]; and Vicodin [hydrocodone-acetaminophen]   Social History        Tobacco Use  . Smoking status: Former Smoker    Packs/day: 0.50    Years: 4.00    Pack years: 2.00    Types: Cigarettes    Last attempt to quit: 08/07/1972    Years since quitting: 45.6  . Smokeless tobacco: Never Used  Substance Use Topics  . Alcohol use: Yes    Frequency: Never    Comment: 02/11/2018 "couple drinks/year; if that"  . Drug use: Never     Family Hx: The patient's family history includes Alcoholism in her brother, brother, father, and sister; Congestive Heart Failure in her mother; Heart attack in her father; Throat cancer in her brother.  ROS:   Please see the history of present illness.    Review of Systems  Cardiovascular: Positive for dyspnea on exertion.  Skin: Positive for itching and rash.  Gastrointestinal: Positive for diarrhea.  Neurological: Positive for headaches and loss of balance.   All other systems reviewed and are negative.   EKGs/Labs/Other Test Reviewed:    EKG:  EKG is  ordered today.  The ekg ordered today demonstrates atrial fibrillation, HR 99, intermittent V pacing  Recent  Labs: 02/10/2018: ALT 19; B Natriuretic Peptide 550.4; Magnesium 2.1; TSH 8.636 02/13/2018: Hemoglobin 8.9; Platelets 207 03/09/2018: BUN 39; Creatinine, Ser 1.11; Potassium 4.1; Sodium 141  Recent Lipid Panel Labs (Brief)       Lab Results  Component Value Date/Time   CHOL 95 (L) 03/20/2017 07:28 AM   TRIG 45 03/20/2017 07:28 AM   HDL 58 03/20/2017 07:28 AM   CHOLHDL 1.6 03/20/2017 07:28 AM   CHOLHDL 2.7 07/18/2016 03:49 AM   LDLCALC 28 03/20/2017 07:28 AM      Physical Exam:    VS:  BP 122/70   Pulse 99   Ht _0  (1.651 m)   Wt 131 lb (59.4 kg)   SpO2 96%   BMI 21.80 kg/m        Wt Readings from Last 3 Encounters:  03/17/18 131 lb (59.4 kg)  03/09/18 136 lb (61.7 kg)  03/02/18 138 lb 1.9 oz (62.7 kg)     Physical Exam  Constitutional: She is oriented to person, place, and time. She appears well-developed and well-nourished. No distress.  HENT:  Head: Normocephalic and atraumatic.  Eyes: No scleral icterus.  Neck: No JVD present. No thyromegaly present.  Cardiovascular: Normal rate and regular rhythm.  No murmur heard. Pulmonary/Chest: She has no rales.  Abdominal: Soft. There is no abdominal tenderness.  Musculoskeletal:        General: Edema (trace-1+ tight bilat LE edema) present.  Lymphadenopathy:    She has no cervical adenopathy.  Neurological: She is alert and oriented to person, place, and time.  Skin: Skin is warm and dry.  Multiple excoriations to bilat LE   Psychiatric: She has a normal mood and affect.    ASSESSMENT & PLAN:    Persistent atrial fibrillation She remains in atrial fibrillation.  Her heart rate has improved since starting on amiodarone and increasing her pindolol.  She is somewhat asymptomatic.  We have decided on a rhythm control strategy given her prior history of decompensated congestive heart failure in the setting of atrial fibrillation with rapid rate.  She has been loaded with approximately 6 g of amiodarone  since initiation.  I have recommended that we go ahead and proceed with cardioversion next week, after adequate loading.             -Continue current dose of pindolol, amiodarone             -She remains on Apixaban with good adherence             -Arrange elective cardioversion next Friday, 03/28/2018             -Arrange 2-week follow-up post cardioversion  Chronic heart failure with preserved ejection fraction (HCC) Volume status has improved since she was last seen.  Her leg swelling has improved significantly.  She continues to have pruritus of her lower extremities.  She has difficulty not scratching and has multiple excoriations.  She does have a home health nurse coming out to help with wound care.  I have asked her to try to use some 1% hydrocortisone cream and Benadryl to help cut down on the pruritus.  Continue current dose of Lasix.  Follow-up BMET has already been obtained today.  If her renal function has worsened, I will reduce her Lasix further.  Coronary artery disease involving native coronary artery of native heart without angina pectoris History of non-ST elevation myocardial infarctionin2018 treated with drug-eluting stent tothediagonal.  She denies anginal symptoms.  She is not on aspirin as she is on Apixaban.  Continue beta-blocker, statin.  Essential hypertension The patient's blood pressure is controlled on her current regimen.  Continue current therapy.  Dispo:  Return in about 4 weeks (around 04/14/2018) for Post Procedure Follow Up, w/ Dr. Harrington Challenger, or Richardson Dopp, PA-C.   Medication Adjustments/Labs and Tests Ordered: Current medicines are reviewed at length with the patient today.  Concerns regarding medicines are outlined above.  Tests Ordered:    Orders Placed This Encounter  Procedures  . CBC   Medication Changes: No orders of the defined types were placed in this encounter.   Signed, Richardson Dopp, PA-C  03/17/2018 12:33 PM    [facility] [place], [place], [place]  [address] Phone: [phone]; Fax: [phone]      For DCCV; compliant with apixaban; no changes Kirk Ruths

## 2018-03-28 NOTE — Anesthesia Postprocedure Evaluation (Signed)
Anesthesia Post Note  Patient: Meredith Gonzalez  Procedure(s) Performed: CARDIOVERSION (N/A )     Patient location during evaluation: Endoscopy Anesthesia Type: General Level of consciousness: awake Pain management: pain level controlled Vital Signs Assessment: post-procedure vital signs reviewed and stable Respiratory status: spontaneous breathing Cardiovascular status: stable Postop Assessment: no apparent nausea or vomiting Anesthetic complications: no    Last Vitals:  Vitals:   03/27/18 1010 03/27/18 1013  BP: (!) 144/57 (!) 138/57  Pulse: 64 60  Resp: 15 17  Temp:  36.7 C  SpO2: 100% 100%    Last Pain:  Vitals:   03/27/18 1013  TempSrc: Oral  PainSc: 0-No pain   Pain Goal:                 Huston Foley

## 2018-03-29 ENCOUNTER — Encounter (HOSPITAL_COMMUNITY): Payer: Self-pay | Admitting: Cardiology

## 2018-04-03 ENCOUNTER — Ambulatory Visit: Payer: Medicare Other | Admitting: Physician Assistant

## 2018-04-10 ENCOUNTER — Encounter: Payer: Self-pay | Admitting: Physician Assistant

## 2018-04-10 ENCOUNTER — Ambulatory Visit (INDEPENDENT_AMBULATORY_CARE_PROVIDER_SITE_OTHER): Payer: Medicare Other | Admitting: Physician Assistant

## 2018-04-10 VITALS — BP 130/58 | HR 76 | Ht 65.0 in | Wt 124.1 lb

## 2018-04-10 DIAGNOSIS — I5032 Chronic diastolic (congestive) heart failure: Secondary | ICD-10-CM | POA: Diagnosis not present

## 2018-04-10 DIAGNOSIS — Z79899 Other long term (current) drug therapy: Secondary | ICD-10-CM

## 2018-04-10 DIAGNOSIS — I2510 Atherosclerotic heart disease of native coronary artery without angina pectoris: Secondary | ICD-10-CM | POA: Diagnosis not present

## 2018-04-10 DIAGNOSIS — I4819 Other persistent atrial fibrillation: Secondary | ICD-10-CM | POA: Diagnosis not present

## 2018-04-10 DIAGNOSIS — I10 Essential (primary) hypertension: Secondary | ICD-10-CM

## 2018-04-10 DIAGNOSIS — Z950 Presence of cardiac pacemaker: Secondary | ICD-10-CM

## 2018-04-10 NOTE — Progress Notes (Signed)
Cardiology Office Note:    Date:  04/10/2018   ID:  Meredith Gonzalez, DOB 1935/12/19, MRN [id number]  PCP:  Sinclair Ship, MD  Cardiologist:  Dorris Carnes, MD   Electrophysiologist:  Cristopher Peru, MD   Referring MD: Sinclair Ship, MD   Chief Complaint  Patient presents with  . Hospitalization Follow-up    s/p outpatient cardioversion for AFib     History of Present Illness:    Meredith Gonzalez is a 83 y.o. female with AFib, s/p fall resulting in subdural hematoma, HTN, hypothyroidism, s/p pacemaker (RV lead revision done in 2014),coronary artery diseases/p NSTEMI in 4/18 treated with a DES to the Dx,combined systolic and diastolic CHF,syncope felt to be related to autonomic dysfunction. She was previously not felt to be a candidate for anticoagulationdue to prior falls with subdural hematoma. Prior tilt table test was positive and she was changed from Diltiazem to Pindolol.She had a fall and fractured her c-spine in 12/2017.  She was admitted in November 2019 with atrial fibrillation with rapid rate complicated by decompensated heart failure.  She was placed on apixaban for anticoagulation.  Amiodarone was started cardioversion was arranged after adequate loading.  She underwent successful cardioversion 03/27/2018.    [name] returns for follow-up.  She is here today with her son.  Since her cardioversion, she has not had any change in her symptoms.  She remains short of breath with activities.  This is stable without significant change.  She sleeps on an incline.  She denies paroxysmal nocturnal dyspnea.  Her leg swelling has improved.  She had a recent cough and saw primary care.  She took a round of antibiotics.  She had a chest x-ray that was suggestive of bronchiectasis.  She has a chest CT pending.  Her cough has improved.  She has not had any falls.  Prior CV studies:   The following studies were reviewed today:  TEE 02/13/18 EF 50-55, no RWMA, mild MR, mod LAE, mild RV dilation, mild  RAE, atrial septal lipomatous hypertrophy, no R-L atrial level shunt, mod TR, mild PI  Echo 02/11/18 EF 50-55, mild MR, mod LAE, mod RV dilation, mod RAE, severe TR, PASP 49  LHC 07/19/16 LM normal LAD okay, D1 95 LCx normal RCA proximal 20 EF 35-45 LVEDP 20 PCI: 2.5 x 16 mm Promus DES to the D1  Limited echo 07/17/16 EF 50, apical anterior, apical septal and true apical HK, MAC, trivial MR, mild LAE  Echo 07/14/16 Apical, apical inferolateral, apical anterior, apical lateral HK, normal LV function, grade 2 diastolic dysfunction, trivial MR, mild LAE, PASP 50  Myoview 07/16/16 Anteroapical infarct with peri-infarct ischemia, EF 38, high risk   Past Medical History:  Diagnosis Date  . Anemia   . Anxiety   . Arthritis    "all over" (02/11/2018)  . Basal cell carcinoma    "left leg" (02/11/2018)  . Cervical spine fracture ([place]) 12/2017   "C1-2"  . CHF (congestive heart failure) ([place])   . Chronic bronchitis ([place])   . Chronic neck pain    "since I broke my neck 6-8 wk ago" (02/11/2018)  . Diabetic peripheral neuropathy ([place])   . Fibromyalgia   . Fracture of right humerus   . Generalized weakness   . Headache    "weekly" (02/11/2018)  . History of blood transfusion    "related to one of my femur surgeries" (02/11/2018)  . History of echocardiogram    Echo 8/18: EF 50-55, no RWMA, Gr 1 DD,  calcified AV leaflets, MAC, trivial MR, mod LAE, PASP 37  . Hypertension   . Hypothyroidism   . Incontinence of urine   . Ischemic stroke ([place]) 2000   "lost part of the vision in my right eye" (02/11/2018)  . Major depression, chronic   . Myocardial infarction ([place]) 06/2016  . Pacemaker   . Recurrent falls   . Syncope and collapse   . Type 2 diabetes, diet controlled ([place])    Surgical Hx: The patient  has a past surgical history that includes Abdominal hysterectomy; Femur fracture surgery (Bilateral); Carpal tunnel release; Gastric bypass; Eye surgery; Colonoscopy; Lead  revision (N/A, 01/01/2013); LEFT HEART CATH AND CORONARY ANGIOGRAPHY (N/A, 07/19/2016); CORONARY STENT INTERVENTION (N/A, 07/19/2016); Excision basal cell carcinoma (Left); Ankle fracture surgery (Right); Tonsillectomy; Cholecystectomy open; Fracture surgery; Cataract extraction w/ intraocular lens  implant, bilateral (Bilateral); Insert / replace / remove pacemaker (~ 2012); Ovarian cyst surgery; Coronary angioplasty with stent (06/2016); Cardiac catheterization (~ 2014); TEE without cardioversion (N/A, 02/13/2018); Cardioversion (N/A, 02/13/2018); and Cardioversion (N/A, 03/27/2018).   Current Medications: Current Meds  Medication Sig  . acetaminophen (TYLENOL) 500 MG tablet Take 500 mg by mouth every 6 (six) hours as needed for moderate pain.   Marland Kitchen albuterol (PROVENTIL HFA;VENTOLIN HFA) 108 (90 Base) MCG/ACT inhaler Inhale 2 puffs into the lungs every 6 (six) hours as needed for wheezing or shortness of breath.   Marland Kitchen amiodarone (PACERONE) 200 MG tablet Take 1 tablet (200 mg total) by mouth 2 (two) times daily.  Marland Kitchen ammonium lactate (LAC-HYDRIN) 12 % lotion Apply 1 application topically daily. In the morning after bathing  . apixaban (ELIQUIS) 5 MG TABS tablet Take 1 tablet (5 mg total) by mouth 2 (two) times daily. Prescription for free 30 day supply  . Calcium Citrate-Vitamin D3 315-250 MG-UNIT TABS Take 1 tablet by mouth 2 (two) times daily.  . diphenhydrAMINE (BENADRYL ALLERGY) 25 MG tablet Take 25 mg by mouth every 8 (eight) hours as needed (itching/allergies.).   Marland Kitchen ferrous sulfate 325 (65 FE) MG tablet Take 1 tablet (325 mg total) by mouth 2 (two) times daily with a meal.  . flintstones complete (FLINTSTONES) 60 MG chewable tablet Chew 1 tablet by mouth daily.  Marland Kitchen FLUoxetine (PROZAC) 20 MG capsule Take 60 mg by mouth daily.   . fluticasone (FLONASE) 50 MCG/ACT nasal spray Place 1 spray into both nostrils daily as needed (allergies.).   Marland Kitchen furosemide (LASIX) 20 MG tablet Take 20 mg by mouth daily.  .  furosemide (LASIX) 40 MG tablet Take 40 mg by mouth daily.  Marland Kitchen gabapentin (NEURONTIN) 300 MG capsule Take 300-600 mg by mouth See admin instructions. Take 1 capsule (300 mg) by mouth in the mornning & take 2 capsules (600 mg) by mouth at night  . Melatonin 10 MG TABS Take 40 mg by mouth at bedtime.  . Multiple Vitamins-Minerals (MULTIVITAMIN ADULT) TABS Take 1 tablet by mouth daily.  . pantoprazole (PROTONIX) 40 MG tablet Take 1 tablet (40 mg total) by mouth daily. Can switch to any PPI  . pindolol (VISKEN) 5 MG tablet Take 10 mg in the AM and 5 mg in the PM daily by mouth  . potassium chloride (K-DUR) 10 MEQ tablet Take 2 tablets (20 mEq total) by mouth daily.  . pramipexole (MIRAPEX) 0.25 MG tablet Take 0.25 mg by mouth every morning.  . vitamin B-12 (CYANOCOBALAMIN) 1000 MCG tablet Take 1 tablet (1,000 mcg total) by mouth daily.     Allergies:   Valium [  diazepam]; Codeine; Darifenacin; Darvon [propoxyphene hcl]; Daypro [oxaprozin]; Enablex [darifenacin hydrobromide er]; Oxycodone; Propoxyphene; Risperdal [risperidone]; Talwin [pentazocine]; and Vicodin [hydrocodone-acetaminophen]   Social History   Tobacco Use  . Smoking status: Former Smoker    Packs/day: 0.50    Years: 4.00    Pack years: 2.00    Types: Cigarettes    Last attempt to quit: 08/07/1972    Years since quitting: 45.7  . Smokeless tobacco: Never Used  Substance Use Topics  . Alcohol use: Yes    Frequency: Never    Comment: 02/11/2018 "couple drinks/year; if that"  . Drug use: Never     Family Hx: The patient's family history includes Alcoholism in her brother, brother, father, and sister; Congestive Heart Failure in her mother; Heart attack in her father; Throat cancer in her brother.  ROS:   Please see the history of present illness.    Review of Systems  Constitution: Positive for malaise/fatigue.  Cardiovascular: Positive for dyspnea on exertion.  Respiratory: Positive for cough, shortness of breath and  wheezing.   Neurological: Positive for headaches and loss of balance.   All other systems reviewed and are negative.   EKGs/Labs/Other Test Reviewed:    EKG:  EKG is  ordered today.  The ekg ordered today demonstrates normal sinus rhythm, heart rate 76, left anterior fascicular block, right bundle branch block, first-degree AV block, PR 224, QTC 490  Recent Labs: 02/10/2018: ALT 19; B Natriuretic Peptide 550.4; Magnesium 2.1; TSH 8.636 03/17/2018: Hemoglobin 9.9; Platelets 254 03/26/2018: BUN 22; Creatinine, Ser 0.99; Potassium 4.1; Sodium 138   Recent Lipid Panel Lab Results  Component Value Date/Time   CHOL 95 (L) 03/20/2017 07:28 AM   TRIG 45 03/20/2017 07:28 AM   HDL 58 03/20/2017 07:28 AM   CHOLHDL 1.6 03/20/2017 07:28 AM   CHOLHDL 2.7 07/18/2016 03:49 AM   LDLCALC 28 03/20/2017 07:28 AM    Physical Exam:    VS:  BP (!) 130/58   Pulse 76   Ht 5' 5"  (1.651 m)   Wt 124 lb 1.9 oz (56.3 kg)   SpO2 98%   BMI 20.65 kg/m     Wt Readings from Last 3 Encounters:  04/10/18 124 lb 1.9 oz (56.3 kg)  03/27/18 131 lb (59.4 kg)  03/17/18 131 lb (59.4 kg)     Physical Exam  Constitutional: She is oriented to person, place, and time. She appears well-developed and well-nourished. No distress.  HENT:  Head: Normocephalic and atraumatic.  Eyes: No scleral icterus.  Neck: No JVD present. No thyromegaly present.  Cardiovascular: Normal rate and regular rhythm.  No murmur heard. Pulmonary/Chest: Effort normal. She has no rales (dry crackles throughout but no rales).  Abdominal: Soft. She exhibits no distension.  Musculoskeletal:        General: Edema (trace edema bilat) present.  Lymphadenopathy:    She has no cervical adenopathy.  Neurological: She is alert and oriented to person, place, and time.  Skin: Skin is warm and dry.  Psychiatric: She has a normal mood and affect.    ASSESSMENT & PLAN:    Persistent atrial fibrillation She is status post recent cardioversion.   She is maintaining normal sinus rhythm.  Continue amiodarone 200 mg twice daily through the end of the month.  She can then reduce amiodarone to 200 mg once daily.  Arrange follow-up CMET, TSH, CBC in 6 weeks.  Follow-up with Dr. Harrington Challenger in 8 weeks.  Chronic heart failure with preserved ejection fraction (HCC) EF  50-55 by echo in November 2019.  NYHA 3.  Volume status is stable.  Continue current medical regimen.  Coronary artery disease involving native coronary artery of native heart without angina pectoris History of NSTEMI in 2018 treated with a drug-eluting stent to the diagonal.  She has not had any anginal symptoms.  She is not on aspirin as she is on Apixaban.  Continue atorvastatin.  On amiodarone therapy Arrange follow-up CMET, TSH in 6 weeks.  Also arrange baseline pulmonary function test with DLCO.  She would like to try to do these in [facility] if possible.  Essential hypertension The patient's blood pressure is controlled on her current regimen.  Continue current therapy.   Pacemaker in situ Follow-up with EP as planned.   Dispo:  Return in about 8 weeks (around 06/05/2018) for Routine Follow Up, w/ Dr. Harrington Challenger.   Medication Adjustments/Labs and Tests Ordered: Current medicines are reviewed at length with the patient today.  Concerns regarding medicines are outlined above.  Tests Ordered: Orders Placed This Encounter  Procedures  . CBC  . Comprehensive metabolic panel  . TSH  . EKG 12-Lead  . Pulmonary function test   Medication Changes: No orders of the defined types were placed in this encounter.   Signed, Richardson Dopp, PA-C  04/10/2018 12:41 PM    [facility] [address], [place], [place]  [address] Phone: [phone]; Fax: [phone]

## 2018-04-10 NOTE — Patient Instructions (Signed)
Medication Instructions:  Your physician recommends that you continue on your current medications as directed. Please refer to the Current Medication list given to you today.  1. CONTINUE AMIODARONE 200 DAILY UNTIL 04/25/18, THEN DECREASE TO 100 MG DAILY.   If you need a refill on your cardiac medications before your next appointment, please call your pharmacy.   Lab work: 6 WEEKS: CMET, TSH, CBC  If you have labs (blood work) drawn today and your tests are completely normal, you will receive your results only by: Marland Kitchen MyChart Message (if you have MyChart) OR . A paper copy in the mail If you have any lab test that is abnormal or we need to change your treatment, we will call you to review the results.  Testing/Procedures: Your physician has recommended that you have a pulmonary function test. Pulmonary Function Tests are a group of tests that measure how well air moves in and out of your lungs.   Follow-Up: At [facility], you and your health needs are our priority.  As part of our continuing mission to provide you with exceptional heart care, we have created designated Provider Care Teams.  These Care Teams include your primary Cardiologist (physician) and Advanced Practice Providers (APPs -  Physician Assistants and Nurse Practitioners) who all work together to provide you with the care you need, when you need it. You will need a follow up appointment in:  8 weeks.   You may see DR. ROSS or one of the following Advanced Practice Providers on your designated Care Team: Richardson Dopp, PA-C [place], [place] . Daune Perch, NP  Any Other Special Instructions Will Be Listed Below (If Applicable).

## 2018-04-16 ENCOUNTER — Ambulatory Visit (INDEPENDENT_AMBULATORY_CARE_PROVIDER_SITE_OTHER): Payer: Medicare Other | Admitting: Internal Medicine

## 2018-04-16 DIAGNOSIS — I4819 Other persistent atrial fibrillation: Secondary | ICD-10-CM

## 2018-04-16 DIAGNOSIS — Z79899 Other long term (current) drug therapy: Secondary | ICD-10-CM

## 2018-04-16 LAB — PULMONARY FUNCTION TEST
DL/VA % pred: 99 %
DL/VA: 4.88 ml/min/mmHg/L
DLCO unc % pred: 51 %
DLCO unc: 13.22 ml/min/mmHg
FEF 25-75 PRE: 1.3 L/s
FEF2575-%Pred-Pre: 88 %
FEV1-%Pred-Pre: 46 %
FEV1-Pre: 0.95 L
FEV1FVC-%Pred-Pre: 118 %
FEV6-%Pred-Pre: 41 %
FEV6-Pre: 1.08 L
FEV6FVC-%PRED-PRE: 105 %
FVC-%Pred-Pre: 39 %
FVC-Pre: 1.08 L
Pre FEV1/FVC ratio: 88 %
Pre FEV6/FVC Ratio: 100 %
RV % pred: 87 %
RV: 2.13 L
TLC % pred: 64 %
TLC: 3.33 L

## 2018-04-16 NOTE — Progress Notes (Signed)
Patient completed pre spiro of PFT today. Patient only completed one out of two tests of DLCO. She was coughing too much, and asked to stop this part of the test. Pt completed three out of four of the pleth tests due to coughing. Patient refused to completed breathing treatment today for post spiro due to previous heart conditions.

## 2018-04-21 ENCOUNTER — Telehealth: Payer: Self-pay | Admitting: Physician Assistant

## 2018-04-21 DIAGNOSIS — R942 Abnormal results of pulmonary function studies: Secondary | ICD-10-CM

## 2018-04-21 NOTE — Telephone Encounter (Signed)
I have attempted to contact this patient by phone, but VM was full and could not leave a meassage.  I will try to reach out the pt's son (DPR on file).

## 2018-04-21 NOTE — Telephone Encounter (Signed)
New Message    Patient returning Meredith Gonzalez call about lab results.

## 2018-04-21 NOTE — Telephone Encounter (Signed)
Follow up  ° ° °Patient is returning call.  °

## 2018-04-22 NOTE — Telephone Encounter (Signed)
Reviewed results with pt's son who verbalized understanding. Per Richardson Dopp, PA-C to have pt be referred to pulmonologist. Referral has been placed in Epic. I have also send a copy to pt's PCP.

## 2018-04-22 NOTE — Telephone Encounter (Signed)
-----   Message from Liliane Shi, PA-C sent at 04/20/2018  1:20 PM EST ----- Please call patient. PFTs indicate scarring ("interstitial process") and reduced capacity to exchange oxygen ("reduced DLCO").  This should be evaluated further with pulmonology to determine what this means.  We also will need the Pulmonologist to help make sure she is ok to remain on Amiodarone. Recommendations:  - Refer to Pulmonology (Dx: abnormal PFTs)  - Send copy to PCP Richardson Dopp, PA-C    04/20/2018 1:15 PM

## 2018-04-23 ENCOUNTER — Telehealth: Payer: Self-pay | Admitting: Internal Medicine

## 2018-04-23 MED ORDER — AMIODARONE HCL 200 MG PO TABS: 200.0000 mg | ORAL_TABLET | ORAL | 3 refills | Status: DC

## 2018-04-23 NOTE — Telephone Encounter (Signed)
Spoke with the patient, she has been very dizzy for 2 days. She stated it started yesterday and has continued through today. The patient stated she only feels good when she is sitting or laying down. She had a cardioversion on 03/27/18. Advised the patient to send a remote transmission so we can determine if it is afib. The patient has always been asymptomatic with afib. She weighs everyday and is 123.2 lbs and she has been consistently around this weight. She denies any swelling and has SOB when she exerts herself.

## 2018-04-23 NOTE — Telephone Encounter (Signed)
Spoke with Raquel Sarna from device, she stated that after the cardioversion on 1/3 the device has not been transmitting correctly. Spoke with Dr. Lovena Le, the patient should hold Visken, Pindolol and reduce her amiodarone to 1 tablet, 200 mg, in the morning. She needs to let the office know if she is not improving. If she does not improve then she needs to go to the ED. In addition, the patient needs to be seen in the office in the next week to correct the device transmissions. The patient expressed understanding and had no further questions.

## 2018-04-23 NOTE — Telephone Encounter (Signed)
New message     STAT if patient feels like he/she is going to faint   1) Are you dizzy now? Yes yesterday and today   2) Do you feel faint or have you passed out? No   3) Do you have any other symptoms? cant keep balance and cant read   4) Have you checked your HR and BP (record if available)? No

## 2018-04-24 ENCOUNTER — Emergency Department (HOSPITAL_BASED_OUTPATIENT_CLINIC_OR_DEPARTMENT_OTHER): Payer: Medicare Other

## 2018-04-24 ENCOUNTER — Encounter (HOSPITAL_BASED_OUTPATIENT_CLINIC_OR_DEPARTMENT_OTHER): Payer: Self-pay | Admitting: Emergency Medicine

## 2018-04-24 ENCOUNTER — Inpatient Hospital Stay (HOSPITAL_BASED_OUTPATIENT_CLINIC_OR_DEPARTMENT_OTHER)
Admission: EM | Admit: 2018-04-24 | Discharge: 2018-04-27 | DRG: 069 | Disposition: A | Discharge status: Rehabilitation Facility | Payer: Medicare Other | Attending: Internal Medicine | Admitting: Internal Medicine

## 2018-04-24 ENCOUNTER — Other Ambulatory Visit: Payer: Self-pay

## 2018-04-24 ENCOUNTER — Observation Stay (HOSPITAL_BASED_OUTPATIENT_CLINIC_OR_DEPARTMENT_OTHER): Payer: Medicare Other

## 2018-04-24 ENCOUNTER — Emergency Department (HOSPITAL_COMMUNITY): Payer: Medicare Other

## 2018-04-24 DIAGNOSIS — I452 Bifascicular block: Secondary | ICD-10-CM | POA: Diagnosis not present

## 2018-04-24 DIAGNOSIS — Z66 Do not resuscitate: Secondary | ICD-10-CM | POA: Diagnosis present

## 2018-04-24 DIAGNOSIS — I69398 Other sequelae of cerebral infarction: Secondary | ICD-10-CM

## 2018-04-24 DIAGNOSIS — E039 Hypothyroidism, unspecified: Secondary | ICD-10-CM | POA: Diagnosis not present

## 2018-04-24 DIAGNOSIS — I5030 Unspecified diastolic (congestive) heart failure: Secondary | ICD-10-CM | POA: Diagnosis present

## 2018-04-24 DIAGNOSIS — G459 Transient cerebral ischemic attack, unspecified: Secondary | ICD-10-CM | POA: Insufficient documentation

## 2018-04-24 DIAGNOSIS — R402142 Coma scale, eyes open, spontaneous, at arrival to emergency department: Secondary | ICD-10-CM | POA: Diagnosis not present

## 2018-04-24 DIAGNOSIS — R29810 Facial weakness: Secondary | ICD-10-CM | POA: Diagnosis not present

## 2018-04-24 DIAGNOSIS — F329 Major depressive disorder, single episode, unspecified: Secondary | ICD-10-CM | POA: Diagnosis present

## 2018-04-24 DIAGNOSIS — G8194 Hemiplegia, unspecified affecting left nondominant side: Secondary | ICD-10-CM | POA: Diagnosis present

## 2018-04-24 DIAGNOSIS — E785 Hyperlipidemia, unspecified: Secondary | ICD-10-CM | POA: Diagnosis not present

## 2018-04-24 DIAGNOSIS — Z811 Family history of alcohol abuse and dependence: Secondary | ICD-10-CM

## 2018-04-24 DIAGNOSIS — I2510 Atherosclerotic heart disease of native coronary artery without angina pectoris: Secondary | ICD-10-CM | POA: Diagnosis present

## 2018-04-24 DIAGNOSIS — K5900 Constipation, unspecified: Secondary | ICD-10-CM | POA: Diagnosis present

## 2018-04-24 DIAGNOSIS — E1142 Type 2 diabetes mellitus with diabetic polyneuropathy: Secondary | ICD-10-CM | POA: Diagnosis present

## 2018-04-24 DIAGNOSIS — I639 Cerebral infarction, unspecified: Secondary | ICD-10-CM | POA: Diagnosis present

## 2018-04-24 DIAGNOSIS — M797 Fibromyalgia: Secondary | ICD-10-CM | POA: Diagnosis present

## 2018-04-24 DIAGNOSIS — E119 Type 2 diabetes mellitus without complications: Secondary | ICD-10-CM

## 2018-04-24 DIAGNOSIS — I110 Hypertensive heart disease with heart failure: Secondary | ICD-10-CM | POA: Diagnosis present

## 2018-04-24 DIAGNOSIS — I5032 Chronic diastolic (congestive) heart failure: Secondary | ICD-10-CM | POA: Diagnosis present

## 2018-04-24 DIAGNOSIS — Z9841 Cataract extraction status, right eye: Secondary | ICD-10-CM

## 2018-04-24 DIAGNOSIS — Z7951 Long term (current) use of inhaled steroids: Secondary | ICD-10-CM

## 2018-04-24 DIAGNOSIS — R4701 Aphasia: Secondary | ICD-10-CM | POA: Diagnosis not present

## 2018-04-24 DIAGNOSIS — R402362 Coma scale, best motor response, obeys commands, at arrival to emergency department: Secondary | ICD-10-CM | POA: Diagnosis present

## 2018-04-24 DIAGNOSIS — R471 Dysarthria and anarthria: Secondary | ICD-10-CM | POA: Diagnosis not present

## 2018-04-24 DIAGNOSIS — Z90710 Acquired absence of both cervix and uterus: Secondary | ICD-10-CM

## 2018-04-24 DIAGNOSIS — Z950 Presence of cardiac pacemaker: Secondary | ICD-10-CM | POA: Diagnosis present

## 2018-04-24 DIAGNOSIS — I255 Ischemic cardiomyopathy: Secondary | ICD-10-CM | POA: Diagnosis present

## 2018-04-24 DIAGNOSIS — R202 Paresthesia of skin: Secondary | ICD-10-CM | POA: Diagnosis present

## 2018-04-24 DIAGNOSIS — Z87891 Personal history of nicotine dependence: Secondary | ICD-10-CM

## 2018-04-24 DIAGNOSIS — H5461 Unqualified visual loss, right eye, normal vision left eye: Secondary | ICD-10-CM | POA: Diagnosis not present

## 2018-04-24 DIAGNOSIS — R29707 NIHSS score 7: Secondary | ICD-10-CM | POA: Diagnosis present

## 2018-04-24 DIAGNOSIS — I10 Essential (primary) hypertension: Secondary | ICD-10-CM | POA: Diagnosis present

## 2018-04-24 DIAGNOSIS — R270 Ataxia, unspecified: Secondary | ICD-10-CM | POA: Diagnosis not present

## 2018-04-24 DIAGNOSIS — J42 Unspecified chronic bronchitis: Secondary | ICD-10-CM | POA: Diagnosis present

## 2018-04-24 DIAGNOSIS — I252 Old myocardial infarction: Secondary | ICD-10-CM | POA: Diagnosis not present

## 2018-04-24 DIAGNOSIS — I4819 Other persistent atrial fibrillation: Secondary | ICD-10-CM | POA: Diagnosis not present

## 2018-04-24 DIAGNOSIS — Z8249 Family history of ischemic heart disease and other diseases of the circulatory system: Secondary | ICD-10-CM

## 2018-04-24 DIAGNOSIS — G458 Other transient cerebral ischemic attacks and related syndromes: Principal | ICD-10-CM | POA: Diagnosis present

## 2018-04-24 DIAGNOSIS — I361 Nonrheumatic tricuspid (valve) insufficiency: Secondary | ICD-10-CM | POA: Diagnosis not present

## 2018-04-24 DIAGNOSIS — Z9089 Acquired absence of other organs: Secondary | ICD-10-CM

## 2018-04-24 DIAGNOSIS — Z85828 Personal history of other malignant neoplasm of skin: Secondary | ICD-10-CM

## 2018-04-24 DIAGNOSIS — Z7901 Long term (current) use of anticoagulants: Secondary | ICD-10-CM

## 2018-04-24 DIAGNOSIS — R402252 Coma scale, best verbal response, oriented, at arrival to emergency department: Secondary | ICD-10-CM | POA: Diagnosis present

## 2018-04-24 DIAGNOSIS — Z9049 Acquired absence of other specified parts of digestive tract: Secondary | ICD-10-CM

## 2018-04-24 DIAGNOSIS — Z9842 Cataract extraction status, left eye: Secondary | ICD-10-CM

## 2018-04-24 DIAGNOSIS — Z79890 Hormone replacement therapy (postmenopausal): Secondary | ICD-10-CM

## 2018-04-24 DIAGNOSIS — Z808 Family history of malignant neoplasm of other organs or systems: Secondary | ICD-10-CM

## 2018-04-24 DIAGNOSIS — Z885 Allergy status to narcotic agent status: Secondary | ICD-10-CM

## 2018-04-24 DIAGNOSIS — Z961 Presence of intraocular lens: Secondary | ICD-10-CM | POA: Diagnosis present

## 2018-04-24 DIAGNOSIS — Z955 Presence of coronary angioplasty implant and graft: Secondary | ICD-10-CM

## 2018-04-24 DIAGNOSIS — Z9181 History of falling: Secondary | ICD-10-CM

## 2018-04-24 DIAGNOSIS — N393 Stress incontinence (female) (male): Secondary | ICD-10-CM | POA: Diagnosis present

## 2018-04-24 DIAGNOSIS — Z888 Allergy status to other drugs, medicaments and biological substances status: Secondary | ICD-10-CM

## 2018-04-24 DIAGNOSIS — Z9884 Bariatric surgery status: Secondary | ICD-10-CM

## 2018-04-24 DIAGNOSIS — Z79899 Other long term (current) drug therapy: Secondary | ICD-10-CM

## 2018-04-24 LAB — DIFFERENTIAL
ABS IMMATURE GRANULOCYTES: 0.03 10*3/uL (ref 0.00–0.07)
Basophils Absolute: 0.1 10*3/uL (ref 0.0–0.1)
Basophils Relative: 1 %
Eosinophils Absolute: 0.2 10*3/uL (ref 0.0–0.5)
Eosinophils Relative: 3 %
Immature Granulocytes: 0 %
Lymphocytes Relative: 11 %
Lymphs Abs: 0.8 10*3/uL (ref 0.7–4.0)
Monocytes Absolute: 0.7 10*3/uL (ref 0.1–1.0)
Monocytes Relative: 10 %
Neutro Abs: 5.4 10*3/uL (ref 1.7–7.7)
Neutrophils Relative %: 75 %

## 2018-04-24 LAB — COMPREHENSIVE METABOLIC PANEL
ALK PHOS: 94 U/L (ref 38–126)
ALT: 14 U/L (ref 0–44)
AST: 24 U/L (ref 15–41)
Albumin: 3.4 g/dL — ABNORMAL LOW (ref 3.5–5.0)
Anion gap: 6 (ref 5–15)
BUN: 34 mg/dL — ABNORMAL HIGH (ref 8–23)
CALCIUM: 8.7 mg/dL — AB (ref 8.9–10.3)
CO2: 28 mmol/L (ref 22–32)
Chloride: 103 mmol/L (ref 98–111)
Creatinine, Ser: 1.11 mg/dL — ABNORMAL HIGH (ref 0.44–1.00)
GFR calc Af Amer: 54 mL/min — ABNORMAL LOW (ref >60)
GFR calc non Af Amer: 47 mL/min — ABNORMAL LOW (ref >60)
Glucose, Bld: 116 mg/dL — ABNORMAL HIGH (ref 70–99)
Potassium: 3.6 mmol/L (ref 3.5–5.1)
Sodium: 137 mmol/L (ref 135–145)
Total Bilirubin: 0.4 mg/dL (ref 0.3–1.2)
Total Protein: 7.1 g/dL (ref 6.5–8.1)

## 2018-04-24 LAB — TROPONIN I
Troponin I: <0.03 ng/mL (ref <0.03)
Troponin I: <0.03 ng/mL (ref <0.03)
Troponin I: <0.03 ng/mL (ref <0.03)

## 2018-04-24 LAB — CBC
HEMATOCRIT: 39.1 % (ref 36.0–46.0)
Hemoglobin: 11.4 g/dL — ABNORMAL LOW (ref 12.0–15.0)
MCH: 28.6 pg (ref 26.0–34.0)
MCHC: 29.2 g/dL — ABNORMAL LOW (ref 30.0–36.0)
MCV: 98.2 fL (ref 80.0–100.0)
Platelets: 232 10*3/uL (ref 150–400)
RBC: 3.98 MIL/uL (ref 3.87–5.11)
RDW: 16.2 % — ABNORMAL HIGH (ref 11.5–15.5)
WBC: 7.2 10*3/uL (ref 4.0–10.5)
nRBC: 0 % (ref 0.0–0.2)

## 2018-04-24 LAB — ECHOCARDIOGRAM COMPLETE

## 2018-04-24 LAB — PROTIME-INR
INR: 1.26
Prothrombin Time: 15.7 seconds — ABNORMAL HIGH (ref 11.4–15.2)

## 2018-04-24 LAB — TSH: TSH: 66.216 u[IU]/mL — ABNORMAL HIGH (ref 0.350–4.500)

## 2018-04-24 LAB — T4, FREE: Free T4: 0.44 ng/dL — ABNORMAL LOW (ref 0.82–1.77)

## 2018-04-24 LAB — APTT: aPTT: 43 seconds — ABNORMAL HIGH (ref 24–36)

## 2018-04-24 LAB — ETHANOL: Alcohol, Ethyl (B): <10 mg/dL (ref <10)

## 2018-04-24 MED ORDER — ASPIRIN 81 MG PO CHEW
324.0000 mg | CHEWABLE_TABLET | Freq: Once | ORAL | Status: DC
  Filled 2018-04-24: qty 4

## 2018-04-24 MED ORDER — ACETAMINOPHEN 325 MG PO TABS
650.0000 mg | ORAL_TABLET | Freq: Once | ORAL | Status: AC
  Administered 2018-04-24: 650 mg via ORAL
  Filled 2018-04-24: qty 2

## 2018-04-24 MED ORDER — ATORVASTATIN CALCIUM 80 MG PO TABS
80.0000 mg | ORAL_TABLET | Freq: Every day | ORAL | Status: DC
  Administered 2018-04-24 – 2018-04-27 (×4): 80 mg via ORAL
  Filled 2018-04-24 (×4): qty 1

## 2018-04-24 MED ORDER — ACETAMINOPHEN 325 MG PO TABS
650.0000 mg | ORAL_TABLET | ORAL | Status: DC | PRN
  Administered 2018-04-24 – 2018-04-26 (×7): 650 mg via ORAL
  Filled 2018-04-24 (×7): qty 2

## 2018-04-24 MED ORDER — SENNOSIDES-DOCUSATE SODIUM 8.6-50 MG PO TABS: 1.0000 | ORAL_TABLET | Freq: Every evening | ORAL | Status: DC | PRN

## 2018-04-24 MED ORDER — GABAPENTIN 300 MG PO CAPS
300.0000 mg | ORAL_CAPSULE | Freq: Every day | ORAL | Status: DC
  Administered 2018-04-24 – 2018-04-27 (×4): 300 mg via ORAL
  Filled 2018-04-24 (×4): qty 1

## 2018-04-24 MED ORDER — ATORVASTATIN CALCIUM 40 MG PO TABS: 40.0000 mg | ORAL_TABLET | Freq: Every day | ORAL | Status: DC

## 2018-04-24 MED ORDER — NON FORMULARY: 3.0000 mg | Freq: Once | Status: DC

## 2018-04-24 MED ORDER — IOPAMIDOL (ISOVUE-370) INJECTION 76%
100.0000 mL | Freq: Once | INTRAVENOUS | Status: AC | PRN
  Administered 2018-04-24: 50 mL via INTRAVENOUS

## 2018-04-24 MED ORDER — AMIODARONE HCL 200 MG PO TABS
200.0000 mg | ORAL_TABLET | ORAL | Status: DC
  Administered 2018-04-24 – 2018-04-27 (×4): 200 mg via ORAL
  Filled 2018-04-24 (×5): qty 1

## 2018-04-24 MED ORDER — ASPIRIN 325 MG PO TABS
325.0000 mg | ORAL_TABLET | Freq: Every day | ORAL | Status: DC
  Administered 2018-04-25: 325 mg via ORAL
  Filled 2018-04-24 (×2): qty 1

## 2018-04-24 MED ORDER — APIXABAN 2.5 MG PO TABS: 2.5000 mg | ORAL_TABLET | Freq: Two times a day (BID) | ORAL | Status: DC

## 2018-04-24 MED ORDER — ACETAMINOPHEN 160 MG/5ML PO SOLN: 650.0000 mg | ORAL | Status: DC | PRN

## 2018-04-24 MED ORDER — SODIUM CHLORIDE 0.9 % IV SOLN
INTRAVENOUS | Status: DC
  Administered 2018-04-24: 13:00:00 via INTRAVENOUS

## 2018-04-24 MED ORDER — GABAPENTIN 300 MG PO CAPS
600.0000 mg | ORAL_CAPSULE | Freq: Every day | ORAL | Status: DC
  Administered 2018-04-24 – 2018-04-26 (×3): 600 mg via ORAL
  Filled 2018-04-24 (×3): qty 2

## 2018-04-24 MED ORDER — MELATONIN 3 MG PO TABS
3.0000 mg | ORAL_TABLET | Freq: Every day | ORAL | Status: AC
  Administered 2018-04-24: 3 mg via ORAL
  Filled 2018-04-24: qty 1

## 2018-04-24 MED ORDER — STROKE: EARLY STAGES OF RECOVERY BOOK
Freq: Once | Status: AC
  Administered 2018-04-24: 13:00:00
  Filled 2018-04-24: qty 1

## 2018-04-24 MED ORDER — PRAMIPEXOLE DIHYDROCHLORIDE 0.25 MG PO TABS
0.2500 mg | ORAL_TABLET | Freq: Every day | ORAL | Status: DC | PRN
  Filled 2018-04-24: qty 1

## 2018-04-24 MED ORDER — APIXABAN 5 MG PO TABS
5.0000 mg | ORAL_TABLET | Freq: Two times a day (BID) | ORAL | Status: DC
  Administered 2018-04-24: 5 mg via ORAL
  Filled 2018-04-24: qty 1

## 2018-04-24 MED ORDER — ACETAMINOPHEN 650 MG RE SUPP: 650.0000 mg | RECTAL | Status: DC | PRN

## 2018-04-24 MED ORDER — SODIUM CHLORIDE 0.9 % IV BOLUS
500.0000 mL | Freq: Once | INTRAVENOUS | Status: AC
  Administered 2018-04-24: 500 mL via INTRAVENOUS

## 2018-04-24 MED ORDER — ASPIRIN 300 MG RE SUPP: 300.0000 mg | Freq: Every day | RECTAL | Status: DC

## 2018-04-24 NOTE — Consult Note (Addendum)
Neurology Consultation  Reason for Consult: Stroke Referring Physician: Dr. Carmin Muskrat  CC: Left-sided weakness, ataxia  History is obtained from: Patient  HPI: Meredith Gonzalez is a 83 y.o. female past medical history of atrial fibrillation with recent cardioversion on Eliquis-last dose yesterday, C-spine fracture last year, fibromyalgia, hypertension, hypothyroidism, major depression, recurrent falls, type 2 diabetes presented to [facility] for concerns for chest pain and left arm tingling.  On examination at the outside center, she was noted to have some left hemiparesis and ataxia on the left side.  There was concern for posterior circulation stroke and she was transferred to [facility] for further imaging. tPA was not given because she presented beyond 4-1/2-hour window presentation according to the notes from outside. Patient is unable to provide a clear last known normal but she says that she has been having more falls and "walking like a drunk" for the past 2 days-last known normal Wednesday, 04/22/2018. She said that she went to bed at 11 PM and was unable to get out of the bed at 3 AM and had a fall.  That is when she was brought to the outside ER.  To the best of our history taking, the last known normal at best is Wednesday, 04/22/2018.  LKW: Wednesday, 04/22/2018 tpa given?: no, decision made at the outside hospital because she presented beyond 4 and half hour window and also was on oral anticoagulant. Premorbid modified Rankin scale (mRS): 4  ROS: ROS was performed and is negative except as noted in the HPI  Past Medical History:  Diagnosis Date  . Anemia   . Anxiety   . Arthritis    "all over" (02/11/2018)  . Basal cell carcinoma    "left leg" (02/11/2018)  . Cervical spine fracture ([place]) 12/2017   "C1-2"  . CHF (congestive heart failure) ([place])   . Chronic bronchitis ([place])   . Chronic neck pain    "since I broke my neck 6-8 wk ago" (02/11/2018)   . Diabetic peripheral neuropathy ([place])   . Fibromyalgia   . Fracture of right humerus   . Generalized weakness   . Headache    "weekly" (02/11/2018)  . History of blood transfusion    "related to one of my femur surgeries" (02/11/2018)  . History of echocardiogram    Echo 8/18: EF 50-55, no RWMA, Gr 1 DD, calcified AV leaflets, MAC, trivial MR, mod LAE, PASP 37  . Hypertension   . Hypothyroidism   . Incontinence of urine   . Ischemic stroke ([place]) 2000   "lost part of the vision in my right eye" (02/11/2018)  . Major depression, chronic   . Myocardial infarction ([place]) 06/2016  . Pacemaker   . Recurrent falls   . Syncope and collapse   . Type 2 diabetes, diet controlled ([place])     Family History  Problem Relation Age of Onset  . Congestive Heart Failure Mother   . Heart attack Father   . Alcoholism Father   . Alcoholism Sister   . Alcoholism Brother   . Alcoholism Brother   . Throat cancer Brother    Social History:   reports that she quit smoking about 45 years ago. Her smoking use included cigarettes. She has a 2.00 pack-year smoking history. She has never used smokeless tobacco. She reports current alcohol use. She reports that she does not use drugs.  Medications  Current Facility-Administered Medications:  .  aspirin chewable tablet 324 mg, 324 mg, Oral,  Once, Pollina, Gwenyth Allegra, MD  Current Outpatient Medications:  .  acetaminophen (TYLENOL) 500 MG tablet, Take 500 mg by mouth every 6 (six) hours as needed for moderate pain. , Disp: , Rfl:  .  albuterol (PROVENTIL HFA;VENTOLIN HFA) 108 (90 Base) MCG/ACT inhaler, Inhale 2 puffs into the lungs every 6 (six) hours as needed for wheezing or shortness of breath. , Disp: , Rfl:  .  amiodarone (PACERONE) 200 MG tablet, Take 1 tablet (200 mg total) by mouth every morning., Disp: 90 tablet, Rfl: 3 .  ammonium lactate (LAC-HYDRIN) 12 % lotion, Apply 1 application topically daily. In the morning after bathing, Disp: , Rfl:  0 .  apixaban (ELIQUIS) 5 MG TABS tablet, Take 1 tablet (5 mg total) by mouth 2 (two) times daily. Prescription for free 30 day supply, Disp: 60 tablet, Rfl: 0 .  atorvastatin (LIPITOR) 40 MG tablet, Take 1 tablet (40 mg total) by mouth daily., Disp: 90 tablet, Rfl: 3 .  Calcium Citrate-Vitamin D3 315-250 MG-UNIT TABS, Take 1 tablet by mouth 2 (two) times daily., Disp: , Rfl:  .  diphenhydrAMINE (BENADRYL ALLERGY) 25 MG tablet, Take 25 mg by mouth every 8 (eight) hours as needed (itching/allergies.). , Disp: , Rfl:  .  ferrous sulfate 325 (65 FE) MG tablet, Take 1 tablet (325 mg total) by mouth 2 (two) times daily with a meal., Disp: 60 tablet, Rfl: 0 .  flintstones complete (FLINTSTONES) 60 MG chewable tablet, Chew 1 tablet by mouth daily., Disp: , Rfl:  .  FLUoxetine (PROZAC) 20 MG capsule, Take 60 mg by mouth daily. , Disp: , Rfl: 0 .  fluticasone (FLONASE) 50 MCG/ACT nasal spray, Place 1 spray into both nostrils daily as needed (allergies.). , Disp: , Rfl: 4 .  furosemide (LASIX) 20 MG tablet, Take 20 mg by mouth daily., Disp: , Rfl:  .  furosemide (LASIX) 40 MG tablet, Take 40 mg by mouth daily., Disp: , Rfl:  .  gabapentin (NEURONTIN) 300 MG capsule, Take 300-600 mg by mouth See admin instructions. Take 1 capsule (300 mg) by mouth in the mornning & take 2 capsules (600 mg) by mouth at night, Disp: , Rfl:  .  Melatonin 10 MG TABS, Take 40 mg by mouth at bedtime., Disp: , Rfl:  .  Multiple Vitamins-Minerals (MULTIVITAMIN ADULT) TABS, Take 1 tablet by mouth daily., Disp: , Rfl:  .  nitroGLYCERIN (NITROSTAT) 0.4 MG SL tablet, Place 1 tablet (0.4 mg total) under the tongue every 5 (five) minutes as needed for chest pain., Disp: 25 tablet, Rfl: 2 .  pantoprazole (PROTONIX) 40 MG tablet, Take 1 tablet (40 mg total) by mouth daily. Can switch to any PPI, Disp: 30 tablet, Rfl: 3 .  pindolol (VISKEN) 5 MG tablet, Take 10 mg in the AM and 5 mg in the PM daily by mouth, Disp: 270 tablet, Rfl: 3 .   potassium chloride (K-DUR) 10 MEQ tablet, Take 2 tablets (20 mEq total) by mouth daily., Disp: 90 tablet, Rfl: 3 .  pramipexole (MIRAPEX) 0.25 MG tablet, Take 0.25 mg by mouth every morning., Disp: , Rfl:  .  vitamin B-12 (CYANOCOBALAMIN) 1000 MCG tablet, Take 1 tablet (1,000 mcg total) by mouth daily., Disp: 30 tablet, Rfl: 0  Exam: Current vital signs: BP (!) 141/69   Pulse 78   Resp 16   SpO2 97%  Vital signs in last 24 hours: Pulse Rate:  [78-80] 78 (01/31 0701) Resp:  [13-16] 16 (01/31 0701) BP: (138-141)/(64-69) 141/69 (01/31  0701) SpO2:  [97 %-99 %] 97 % (01/31 0701) General: Awake alert in no distress HEENT: Normocephalic atraumatic dry mucous membranes Lungs: Clear to auscultation Cardiovascular: Respiratory regular rhythm no murmur rub gallop Abdomen: Soft nondistended nontender Extremities: Warm well perfused with intact peripheral pulses and no edema Neurological exam Mental status: Awake alert oriented x3 Language and speech: Her speech is mildly dysarthric with some stuttering.  Naming, repetition fluency and comprehension are intact. Cranial nerves: Pupils equal round reactive light, extraocular movements intact, visual fields are full, face symmetric, facial sensation decreased on the left, auditory acuity intact, palate midline, shoulder shrug intact, tongue midline. Motor exam: Left upper extremity 4+/5 with no vertical drift.  Right upper extremity 5/5.  Left lower extremity 5/5.  Right lower extremity 5/5. Sensory: Decreased facial sensation on the left as above but otherwise symmetric sensation on extremities and trunk with no extinction on double 17 stimulation. Coordination: Mildly ataxic on left finger-nose-finger.  Otherwise intact. Gait testing was deferred at this time. NIHSS-3   Labs I have reviewed labs in epic and the results pertinent to this consultation are:  CBC    Component Value Date/Time   WBC 7.2 04/24/2018 0545   RBC 3.98 04/24/2018 0545     HGB 11.4 (L) 04/24/2018 0545   HGB 9.9 (L) 03/17/2018 0942   HCT 39.1 04/24/2018 0545   HCT 31.3 (L) 03/17/2018 0942   PLT 232 04/24/2018 0545   PLT 254 03/17/2018 0942   MCV 98.2 04/24/2018 0545   MCV 91 03/17/2018 0942   MCH 28.6 04/24/2018 0545   MCHC 29.2 (L) 04/24/2018 0545   RDW 16.2 (H) 04/24/2018 0545   RDW 13.9 03/17/2018 0942   LYMPHSABS 0.8 04/24/2018 0545   MONOABS 0.7 04/24/2018 0545   EOSABS 0.2 04/24/2018 0545   BASOSABS 0.1 04/24/2018 0545    CMP     Component Value Date/Time   NA 137 04/24/2018 0545   NA 138 03/26/2018 0822   K 3.6 04/24/2018 0545   CL 103 04/24/2018 0545   CO2 28 04/24/2018 0545   GLUCOSE 116 (H) 04/24/2018 0545   BUN 34 (H) 04/24/2018 0545   BUN 22 03/26/2018 0822   CREATININE 1.11 (H) 04/24/2018 0545   CALCIUM 8.7 (L) 04/24/2018 0545   PROT 7.1 04/24/2018 0545   PROT 6.9 09/13/2016 0927   ALBUMIN 3.4 (L) 04/24/2018 0545   ALBUMIN 3.7 09/13/2016 0927   AST 24 04/24/2018 0545   ALT 14 04/24/2018 0545   ALKPHOS 94 04/24/2018 0545   BILITOT 0.4 04/24/2018 0545   BILITOT 0.2 09/13/2016 0927   GFRNONAA 47 (L) 04/24/2018 0545   GFRAA 54 (L) 04/24/2018 0545    Lipid Panel     Component Value Date/Time   CHOL 95 (L) 03/20/2017 0728   TRIG 45 03/20/2017 0728   HDL 58 03/20/2017 0728   CHOLHDL 1.6 03/20/2017 0728   CHOLHDL 2.7 07/18/2016 0349   VLDL 9 07/18/2016 0349   LDLCALC 28 03/20/2017 0728     Imaging I have reviewed the images obtained: CT-scan of the brain-no acute changes CTA head and neck and CT perfusion study- patent posterior circulation.  No perfusion deficit.  In the anterior circulation, the right internal carotid has a presence of eccentric looking plaque-official report pending   Assessment:  83 year old woman past history of atrial fibrillation with recent cardioversion, currently on Eliquis, last dose yesterday, C-spine fracture last day, fibromyalgia, hypertension, hypothyroidism, major depression,  recurrent falls, type 2 diabetes, presented to  [facility] for concerns of chest pain and left arm tingling.  On examination, she had some left-sided weakness and a code stroke was called.  She was evaluated by telemedicine neurology who documented that she had left-sided axial left hemiparesis along with left visual field cut concerning for posterior circulation stroke.  She was outside the window for IV TPA. On my history taking, her symptoms have been ongoing now for at least 2 days. On my examination, she is an NIH stroke scale of 3-1 for ataxia, 1 for dysarthria and 1 for sensory. I did obtain CT head and neck and CT perfusion study due to the lack of clarity of last known normal. Perfusion did not reveal any perfusion deficits but the CTA head and neck showed a eccentric-looking plaque and stenosis of the right internal carotid artery. Given the history of multiple risk factors like atrial fibrillation and this right carotid disease on CTA, she needs work-up for stroke/TIA.  I believe her symptoms could be related to this symptomatic right carotid.  Impression: Evaluate for acute ischemic stroke.  Recommendations: Admit to hospitalist Telemetry MRI brain without contrast 2D echocardiogram Hold Eliquis for now. Aspirin 325 mg Atorvastatin 80 Check fasting lipid panel and A1c tomorrow morning Physical therapy Occupational Therapy Speech therapy N.p.o. until cleared by speech Frequent neurological checks Consider vascular surgery consult for CEA if deemed appropriate Stroke team will follow the patient with you.   Cancel acute code stroke-last known normal past 24 hours. -- Amie Portland, MD [facility] Pager: [phone] If 7pm to 7am, please call on call as listed on AMION.

## 2018-04-24 NOTE — Progress Notes (Signed)
Nutrition Brief Note  RD consulted for assessment of nutrition requirements/status.  Wt Readings from Last 15 Encounters:  04/24/18 59.4 kg  04/10/18 56.3 kg  03/27/18 59.4 kg  03/17/18 59.4 kg  03/09/18 61.7 kg  03/02/18 62.7 kg  02/14/18 63.4 kg  02/10/18 69.8 kg  12/28/17 63.9 kg  12/16/17 64.3 kg  02/14/17 58.5 kg  12/16/16 60.2 kg  11/11/16 56.5 kg  09/13/16 55.1 kg  08/07/16 53.3 kg    Body mass index is 21.79 kg/m. Patient meets criteria for normal based on current BMI. Weight fluctuating likely related to fluid status. Patient reports recent weight has been more stable.   Current diet order is heart healthy. Pt reports eating most to all of her meal from lunch today. Pt reports appetite has been good currently and PTA with usual consumption of at least 3 meals a day with snacks in between. Son at bedside encourages pt has adequate PO intake. Labs and medications reviewed.   No nutrition interventions warranted at this time. If nutrition issues arise, please consult RD.   Corrin Parker, MS, RD, LDN Pager # [phone] After hours/ weekend pager # [phone]

## 2018-04-24 NOTE — Code Documentation (Signed)
83yo female presented to [facility] today due to dizziness, unsteady gait and slurred speech. Patient reportedly LKW on Wednesday when symptoms started. Patient transferred to [facility] via [place]. Stroke team at the bedside on patient arrival. Patient to CT with team. NIHSS 4, see documentation for details and code stroke times. Patient with mild left facial asymmetry, LUE ataxia, left face decreased sensation and mild dysarthria on exam. CTA and CTP completed. No acute stroke treatment at this time. Patient to admit to medicine for work-up. Bedside handoff with ED RN Martie Round.

## 2018-04-24 NOTE — ED Triage Notes (Signed)
Pt brought in by son  He states she called him yesterday and told him she was having a headache and was having difficulty with her balance even when using her walker  He went to see her last night and states her speech was slurred, worse this morning  States her balance was very off this morning  States she was having some blurred vision when trying to read lines in a book  Pt states her vision was very off on the way here  Pt last normal on Wednesday

## 2018-04-24 NOTE — ED Provider Notes (Signed)
Patient seen on transfer from our affiliated center. After radiographic studies were performed, I discussed them with our neurologist, and subsequently with the patient and her son. Given concern for her dizziness, slow speech, patient requires admission, I discussed this with our hospitalist colleagues.   Carmin Muskrat, MD 04/24/18 1015

## 2018-04-24 NOTE — H&P (Signed)
History and Physical    Meredith Gonzalez IFO:[id number] DOB: 03/24/1936 DOA: 04/24/2018  PCP: Sinclair Ship, MD Consultants:  [facility] - cardiology; [facility] - pulmonology; [facility] - podiatry; [facility] - neurology; [facility] - spine Patient coming from:  Home - lives alone in [place], [place]; [place]: Meredith Gonzalez, [phone]  Chief Complaint: Code stroke  HPI: Meredith Gonzalez is a 83 y.o. female with medical history significant of diet-controlled DM; CAD; CVA in 2000; hypothyroidism; HTN; pacemaker placement; and CHF presenting with weakness and ataxia.  She came because she thought she was having a heart attack - her left arm was tingling and her chest felt like it was blowing up with air.  She told her son her left leg was also tingling.  Denies facial symptoms.  No dysphagia.  +dysarthria, slurred yesterday but moreso today.  +expressive aphasia.  Her son reports some difficulty swallowing her pills last night.  She acknowledges worsening gait instability since Monday.  She does have ongoing chest pressure.   ED Course:  Sent from [place] for ?CVA.  Code stroke cancelled after evaluation.  Needs completion of stroke work-up.  Review of Systems: As per HPI; otherwise review of systems reviewed and negative.   Ambulatory Status: Ambulates with a walker  Past Medical History:  Diagnosis Date  . Anemia   . Anxiety   . Arthritis    "all over" (02/11/2018)  . Basal cell carcinoma    "left leg" (02/11/2018)  . Cervical spine fracture ([place]) 12/2017   "C1-2"  . CHF (congestive heart failure) ([place])   . Chronic bronchitis ([place])   . Chronic neck pain    "since I broke my neck 6-8 wk ago" (02/11/2018)  . Diabetic peripheral neuropathy ([place])   . Fibromyalgia   . Fracture of right humerus   . Generalized weakness   . Headache    "weekly" (02/11/2018)  . History of blood transfusion    "related to one of my femur surgeries" (02/11/2018)  . History of echocardiogram    Echo 8/18: EF  50-55, no RWMA, Gr 1 DD, calcified AV leaflets, MAC, trivial MR, mod LAE, PASP 37  . Hypertension   . Hypothyroidism   . Incontinence of urine   . Ischemic stroke ([place]) 2000   "lost part of the vision in my right eye" (02/11/2018)  . Major depression, chronic   . Myocardial infarction ([place]) 06/2016  . Pacemaker   . Recurrent falls   . Syncope and collapse   . Type 2 diabetes, diet controlled ([place])     Past Surgical History:  Procedure Laterality Date  . ABDOMINAL HYSTERECTOMY    . ANKLE FRACTURE SURGERY Right   . BASAL CELL CARCINOMA EXCISION Left    "leg"  . CARDIAC CATHETERIZATION  ~ 2014  . CARDIOVERSION N/A 02/13/2018   Procedure: CARDIOVERSION;  Surgeon: Sueanne Margarita, MD;  Location: [facility] ENDOSCOPY;  Service: Cardiovascular;  Laterality: N/A;  . CARDIOVERSION N/A 03/27/2018   Procedure: CARDIOVERSION;  Surgeon: Lelon Perla, MD;  Location: [facility] ENDOSCOPY;  Service: Cardiovascular;  Laterality: N/A;  . CARPAL TUNNEL RELEASE     bilateral  . CATARACT EXTRACTION W/ INTRAOCULAR LENS  IMPLANT, BILATERAL Bilateral   . CHOLECYSTECTOMY OPEN    . COLONOSCOPY    . CORONARY ANGIOPLASTY WITH STENT PLACEMENT  06/2016  . CORONARY STENT INTERVENTION N/A 07/19/2016   Procedure: Coronary Stent Intervention;  Surgeon: Peter M Martinique, MD;  Location: [place] CV LAB;  Service: Cardiovascular;  Laterality: N/A;  .  EYE SURGERY     right eye catarace/lens implant  . FEMUR FRACTURE SURGERY Bilateral   . FRACTURE SURGERY    . GASTRIC BYPASS    . INSERT / REPLACE / REMOVE PACEMAKER  ~ 2012  . LEAD REVISION N/A 01/01/2013   Procedure: LEAD REVISION;  Surgeon: Evans Lance, MD;  Location: [facility] CATH LAB;  Service: Cardiovascular;  Laterality: N/A;  . LEFT HEART CATH AND CORONARY ANGIOGRAPHY N/A 07/19/2016   Procedure: Left Heart Cath and Coronary Angiography;  Surgeon: Peter M Martinique, MD;  Location: [place] CV LAB;  Service: Cardiovascular;  Laterality: N/A;  . OVARIAN CYST SURGERY     "one  side only"  . TEE WITHOUT CARDIOVERSION N/A 02/13/2018   Procedure: TRANSESOPHAGEAL ECHOCARDIOGRAM (TEE);  Surgeon: Sueanne Margarita, MD;  [facility] ENDOSCOPY;  Service: Cardiovascular;  Laterality: N/A;  . TONSILLECTOMY      Social History   Socioeconomic History  . Marital status: Widowed    Spouse name: Not on file  . Number of children: Not on file  . Years of education: Not on file  . Highest education level: Not on file  Occupational History  . Not on file  Social Needs  . Financial resource strain: Not on file  . Food insecurity:    Worry: Not on file    Inability: Not on file  . Transportation needs:    Medical: Not on file    Non-medical: Not on file  Tobacco Use  . Smoking status: Former Smoker    Packs/day: 0.50    Years: 4.00    Pack years: 2.00    Types: Cigarettes    Last attempt to quit: 08/07/1972    Years since quitting: 45.7  . Smokeless tobacco: Never Used  Substance and Sexual Activity  . Alcohol use: Yes    Frequency: Never    Comment: 02/11/2018 "couple drinks/year; if that"  . Drug use: Never  . Sexual activity: Not on file  Lifestyle  . Physical activity:    Days per week: Not on file    Minutes per session: Not on file  . Stress: Not on file  Relationships  . Social connections:    Talks on phone: Not on file    Gets together: Not on file    Attends religious service: Not on file    Active member of club or organization: Not on file    Attends meetings of clubs or organizations: Not on file    Relationship status: Not on file  . Intimate partner violence:    Fear of current or ex partner: Not on file    Emotionally abused: Not on file    Physically abused: Not on file    Forced sexual activity: Not on file  Other Topics Concern  . Not on file  Social History Narrative  . Not on file    Allergies  Allergen Reactions  . Valium [Diazepam] Anxiety    Makes patient hyper Makes patient hyper  . Codeine Hives and Itching    Can  take with Benadryl Can take with Benadryl  . Darifenacin Itching    Can take with Benadryl  . Darvon [Propoxyphene Hcl] Itching    Can take with Benadryl  . Daypro [Oxaprozin] Itching    Can take with Benadryl Can take with Benadryl  . Enablex [Darifenacin Hydrobromide Er] Itching    Can take with Benadryl  . Oxycodone Itching    Can take with Benadryl Can take  with Benadryl Can take with Benadryl Can take with Benadryl  . Propoxyphene Itching    Can take with Benadryl  . Risperdal [Risperidone] Itching    Can take with Benadryl Can take with Benadryl  . Talwin [Pentazocine] Itching    Can take with Benadryl Can take with Benadryl  . Vicodin [Hydrocodone-Acetaminophen] Itching    Can take with Benadryl Can take with Benadryl    Family History  Problem Relation Age of Onset  . Congestive Heart Failure Mother   . Heart attack Father   . Alcoholism Father   . Alcoholism Sister   . Alcoholism Brother   . Alcoholism Brother   . Throat cancer Brother     Prior to Admission medications   Medication Sig Start Date End Date Taking? Authorizing Provider  acetaminophen (TYLENOL) 500 MG tablet Take 500 mg by mouth every 6 (six) hours as needed for moderate pain.    Yes [provider]  albuterol (PROVENTIL HFA;VENTOLIN HFA) 108 (90 Base) MCG/ACT inhaler Inhale 2 puffs into the lungs every 6 (six) hours as needed for wheezing or shortness of breath.  02/02/18  Yes [provider]  amiodarone (PACERONE) 200 MG tablet Take 1 tablet (200 mg total) by mouth every morning. 04/23/18  Yes Evans Lance, MD  ammonium lactate (LAC-HYDRIN) 12 % lotion Apply 1 application topically daily. In the morning after bathing 07/05/16  Yes [provider]  apixaban (ELIQUIS) 5 MG TABS tablet Take 1 tablet (5 mg total) by mouth 2 (two) times daily. Prescription for free 30 day supply 02/14/18  Yes Kroeger, Daleen Snook M., PA-C  atorvastatin (LIPITOR) 40 MG tablet Take 1 tablet (40  mg total) by mouth daily. 08/07/16 04/24/18 Yes Weaver, Scott T, PA-C  Calcium Citrate-Vitamin D3 315-250 MG-UNIT TABS Take 1 tablet by mouth 2 (two) times daily.   Yes [provider]  diphenhydrAMINE (BENADRYL ALLERGY) 25 MG tablet Take 25 mg by mouth every 8 (eight) hours as needed (itching/allergies.).    Yes [provider]  ferrous sulfate 325 (65 FE) MG tablet Take 1 tablet (325 mg total) by mouth 2 (two) times daily with a meal. Patient taking differently: Take 325 mg by mouth daily with breakfast.  07/20/16  Yes Thurnell Lose, MD  flintstones complete (FLINTSTONES) 60 MG chewable tablet Chew 1 tablet by mouth daily.   Yes [provider]  fluticasone (FLONASE) 50 MCG/ACT nasal spray Place 1 spray into both nostrils daily as needed (allergies.).  12/08/17  Yes [provider]  furosemide (LASIX) 20 MG tablet Take 20 mg by mouth daily.   Yes [provider]  furosemide (LASIX) 40 MG tablet Take 40 mg by mouth daily.   Yes [provider]  gabapentin (NEURONTIN) 300 MG capsule Take 300-600 mg by mouth See admin instructions. Take 1 capsule (300 mg) by mouth in the mornning & take 2 capsules (600 mg) by mouth at night   Yes [provider]  Melatonin 10 MG TABS Take 40 mg by mouth at bedtime.   Yes [provider]  Multiple Vitamins-Minerals (MULTIVITAMIN ADULT) TABS Take 1 tablet by mouth daily.   Yes [provider]  potassium chloride (K-DUR) 10 MEQ tablet Take 2 tablets (20 mEq total) by mouth daily. 03/19/18 06/17/18 Yes Weaver, Scott T, PA-C  pramipexole (MIRAPEX) 0.25 MG tablet Take 0.25 mg by mouth daily as needed (for mouth blisters).  05/17/16  Yes [provider]  vitamin B-12 (CYANOCOBALAMIN) 1000 MCG tablet  Take 1 tablet (1,000 mcg total) by mouth daily. 07/20/16  Yes Thurnell Lose, MD  nitroGLYCERIN (NITROSTAT) 0.4 MG SL tablet Place 1 tablet (0.4 mg total) under the tongue every 5 (five)  minutes as needed for chest pain. 10/11/16 03/17/18  Fay Records, MD  pantoprazole (PROTONIX) 40 MG tablet Take 1 tablet (40 mg total) by mouth daily. Can switch to any PPI Patient not taking: Reported on 04/24/2018 07/20/16   Thurnell Lose, MD  pindolol (VISKEN) 5 MG tablet Take 10 mg in the AM and 5 mg in the PM daily by mouth 03/02/18   Richardson Dopp T, PA-C    Physical Exam: Vitals:   04/24/18 0945 04/24/18 1000 04/24/18 1015 04/24/18 1030  BP: 140/68 137/67 139/71 (!) 142/72  Pulse: 80 80 76 79  Resp: 12 11 16  (!) 23  Temp:      TempSrc:      SpO2: 95% 92% 97% 97%     . General:  Appears calm and comfortable and is NAD; she does have some mild aphasia and associated frustrating . Eyes:  PERRL, EOMI, normal lids, iris . ENT:  grossly normal hearing, lips & tongue, mmm; appropriate dentition . Neck:  no LAD, masses or thyromegaly; no carotid bruits . Cardiovascular:  RRR, no m/r/g. No LE edema.  Marland Kitchen Respiratory:   CTA bilaterally with no wheezes/rales/rhonchi.  Normal respiratory effort. . Abdomen:  soft, NT, ND, NABS . Skin:  no rash or induration seen on limited exam . Musculoskeletal: LUE and LLE weakness, 4/5 . Psychiatric: flat mood and affect, speech mildly dysarthric with intermittent expressive aphasia, AOx3 . Neurologic:  CN 2-12 grossly intact, moves all extremities in coordinated fashion, sensation intact    Radiological Exams on Admission: Ct Angio Head W Or Wo Contrast  Result Date: 04/24/2018 CLINICAL DATA:  Left-sided weakness and ataxia for 2 days. Evaluate for posterior circulation stroke. EXAM: CT ANGIOGRAPHY HEAD AND NECK CT PERFUSION BRAIN TECHNIQUE: Multidetector CT imaging of the head and neck was performed using the standard protocol during bolus administration of intravenous contrast. Multiplanar CT image reconstructions and MIPs were obtained to evaluate the vascular anatomy. Carotid stenosis measurements (when applicable) are obtained utilizing NASCET  criteria, using the distal internal carotid diameter as the denominator. Multiphase CT imaging of the brain was performed following IV bolus contrast injection. Subsequent parametric perfusion maps were calculated using RAPID software. CONTRAST:  59m ISOVUE-370 IOPAMIDOL (ISOVUE-370) INJECTION 76% COMPARISON:  Head CT from earlier today FINDINGS: CTA NECK FINDINGS Aortic arch: Atherosclerotic plaque.  3 vessel branching. Right carotid system: Extensive atheromatous plaque at the common carotid bifurcation and ICA bulb. Bulb stenosis measures 60%. No definite ulceration or downstream beading. High-grade ECA origin stenosis. Left carotid system: Moderate calcified plaque at the bifurcation without flow limiting stenosis or ulceration. Vertebral arteries: No proximal subclavian stenosis. Mild left vertebral artery dominance. Both vertebral arteries are widely patent to the dura. Skeleton: Cervical spine degeneration. Remote C1 ring and dens fractures . No bony union at these fractures, but unchanged alignment. Other neck: No acute finding Upper chest: Bronchomalacia with severe of the bronchus intermedius at time of imaging. Streaky opacity in the bilateral upper lungs with air trapping. Review of the MIP images confirms the above findings CTA HEAD FINDINGS Anterior circulation: Atherosclerotic calcification on the carotid siphons. No branch occlusion or flow limiting stenosis. Negative for aneurysm. Posterior circulation: Left dominant vertebral artery. The vertebral and basilar arteries are smooth and widely patent. No branch occlusion  or flow limiting stenosis. Negative for aneurysm Venous sinuses: Prominent enhancement within the distal transverse sinuses but no evident fistula or other shunt. Anatomic variants: None significant Delayed phase: Not obtained in the emergent setting Review of the MIP images confirms the above findings CT Brain Perfusion Findings: CBF (<30%) Volume: 75m Perfusion (Tmax>6.0s) volume:  027mIMPRESSION: 1. No emergent large vessel occlusion or infarct/ischemia by CT perfusion. 2. 60% narrowing of the right ICA bulb due to bulky atherosclerotic plaque. 3. Bronchomalacia and advanced right bronchus intermedius airway narrowing with biapical lung opacity at least partially from atelectasis. Electronically Signed   By: JoMonte Fantasia.D.   On: 04/24/2018 09:17   Ct Head Wo Contrast  Result Date: 04/24/2018 CLINICAL DATA:  Focal neuro deficit.  Headache. EXAM: CT HEAD WITHOUT CONTRAST TECHNIQUE: Contiguous axial images were obtained from the base of the skull through the vertex without intravenous contrast. COMPARISON:  12/28/2017 FINDINGS: Brain: No evidence of acute infarction, hemorrhage, hydrocephalus, extra-axial collection or mass lesion/mass effect. Stable small sulcal calcification along the right parietal convexity, incidental in isolation. Moderate chronic small vessel ischemia in the cerebral white matter. Mild for age volume loss. Vascular: Atherosclerotic calcification Skull: No acute or aggressive finding. Prominent right atlantooccipital degenerative spurring. Prior C1 ring fracture that is not covered on this study. Sinuses/Orbits: Negative IMPRESSION: No acute finding or change from 2019. Electronically Signed   By: JoMonte Fantasia.D.   On: 04/24/2018 06:23   Ct Angio Neck W And/or Wo Contrast  Result Date: 04/24/2018 CLINICAL DATA:  Left-sided weakness and ataxia for 2 days. Evaluate for posterior circulation stroke. EXAM: CT ANGIOGRAPHY HEAD AND NECK CT PERFUSION BRAIN TECHNIQUE: Multidetector CT imaging of the head and neck was performed using the standard protocol during bolus administration of intravenous contrast. Multiplanar CT image reconstructions and MIPs were obtained to evaluate the vascular anatomy. Carotid stenosis measurements (when applicable) are obtained utilizing NASCET criteria, using the distal internal carotid diameter as the denominator. Multiphase CT  imaging of the brain was performed following IV bolus contrast injection. Subsequent parametric perfusion maps were calculated using RAPID software. CONTRAST:  5056mSOVUE-370 IOPAMIDOL (ISOVUE-370) INJECTION 76% COMPARISON:  Head CT from earlier today FINDINGS: CTA NECK FINDINGS Aortic arch: Atherosclerotic plaque.  3 vessel branching. Right carotid system: Extensive atheromatous plaque at the common carotid bifurcation and ICA bulb. Bulb stenosis measures 60%. No definite ulceration or downstream beading. High-grade ECA origin stenosis. Left carotid system: Moderate calcified plaque at the bifurcation without flow limiting stenosis or ulceration. Vertebral arteries: No proximal subclavian stenosis. Mild left vertebral artery dominance. Both vertebral arteries are widely patent to the dura. Skeleton: Cervical spine degeneration. Remote C1 ring and dens fractures . No bony union at these fractures, but unchanged alignment. Other neck: No acute finding Upper chest: Bronchomalacia with severe of the bronchus intermedius at time of imaging. Streaky opacity in the bilateral upper lungs with air trapping. Review of the MIP images confirms the above findings CTA HEAD FINDINGS Anterior circulation: Atherosclerotic calcification on the carotid siphons. No branch occlusion or flow limiting stenosis. Negative for aneurysm. Posterior circulation: Left dominant vertebral artery. The vertebral and basilar arteries are smooth and widely patent. No branch occlusion or flow limiting stenosis. Negative for aneurysm Venous sinuses: Prominent enhancement within the distal transverse sinuses but no evident fistula or other shunt. Anatomic variants: None significant Delayed phase: Not obtained in the emergent setting Review of the MIP images confirms the above findings CT Brain Perfusion Findings: CBF (<30%) Volume:  38m Perfusion (Tmax>6.0s) volume: 075mIMPRESSION: 1. No emergent large vessel occlusion or infarct/ischemia by CT  perfusion. 2. 60% narrowing of the right ICA bulb due to bulky atherosclerotic plaque. 3. Bronchomalacia and advanced right bronchus intermedius airway narrowing with biapical lung opacity at least partially from atelectasis. Electronically Signed   By: JoMonte Fantasia.D.   On: 04/24/2018 09:17   Ct Cerebral Perfusion W Contrast  Result Date: 04/24/2018 CLINICAL DATA:  Left-sided weakness and ataxia for 2 days. Evaluate for posterior circulation stroke. EXAM: CT ANGIOGRAPHY HEAD AND NECK CT PERFUSION BRAIN TECHNIQUE: Multidetector CT imaging of the head and neck was performed using the standard protocol during bolus administration of intravenous contrast. Multiplanar CT image reconstructions and MIPs were obtained to evaluate the vascular anatomy. Carotid stenosis measurements (when applicable) are obtained utilizing NASCET criteria, using the distal internal carotid diameter as the denominator. Multiphase CT imaging of the brain was performed following IV bolus contrast injection. Subsequent parametric perfusion maps were calculated using RAPID software. CONTRAST:  5050mSOVUE-370 IOPAMIDOL (ISOVUE-370) INJECTION 76% COMPARISON:  Head CT from earlier today FINDINGS: CTA NECK FINDINGS Aortic arch: Atherosclerotic plaque.  3 vessel branching. Right carotid system: Extensive atheromatous plaque at the common carotid bifurcation and ICA bulb. Bulb stenosis measures 60%. No definite ulceration or downstream beading. High-grade ECA origin stenosis. Left carotid system: Moderate calcified plaque at the bifurcation without flow limiting stenosis or ulceration. Vertebral arteries: No proximal subclavian stenosis. Mild left vertebral artery dominance. Both vertebral arteries are widely patent to the dura. Skeleton: Cervical spine degeneration. Remote C1 ring and dens fractures . No bony union at these fractures, but unchanged alignment. Other neck: No acute finding Upper chest: Bronchomalacia with severe of the  bronchus intermedius at time of imaging. Streaky opacity in the bilateral upper lungs with air trapping. Review of the MIP images confirms the above findings CTA HEAD FINDINGS Anterior circulation: Atherosclerotic calcification on the carotid siphons. No branch occlusion or flow limiting stenosis. Negative for aneurysm. Posterior circulation: Left dominant vertebral artery. The vertebral and basilar arteries are smooth and widely patent. No branch occlusion or flow limiting stenosis. Negative for aneurysm Venous sinuses: Prominent enhancement within the distal transverse sinuses but no evident fistula or other shunt. Anatomic variants: None significant Delayed phase: Not obtained in the emergent setting Review of the MIP images confirms the above findings CT Brain Perfusion Findings: CBF (<30%) Volume: 0mL36mrfusion (Tmax>6.0s) volume: 0mL 76mRESSION: 1. No emergent large vessel occlusion or infarct/ischemia by CT perfusion. 2. 60% narrowing of the right ICA bulb due to bulky atherosclerotic plaque. 3. Bronchomalacia and advanced right bronchus intermedius airway narrowing with biapical lung opacity at least partially from atelectasis. Electronically Signed   By: JonatMonte Fantasia   On: 04/24/2018 09:17    EKG: Independently reviewed.  Atrial paced with rate 81; LVH; RBBB, LAFB with no evidence of acute ischemia   Labs on Admission: I have personally reviewed the available labs and imaging studies at the time of the admission.  Pertinent labs:   Glucose 116 BUN 34/Creatinine 1.11/GFR 47 Troponin <0.03 WBC 7.2 Hgb 11.4 INR 1.26 ETOH <10  Assessment/Plan Principal Problem:   TIA (transient ischemic attack) Active Problems:   Persistent atrial fibrillation   Hypertension   Diabetes (HCC) Lymanacemaker   CAD (coronary artery disease)   (HFpEF) heart failure with preserved ejection fraction (HCC) [place] -Patient with a number of progressive neurologic symptoms concerning for TIA/CVA;  given the duration of symptoms,  TIA is less likely but her CTA was negative for large vessel occlusion -Will place in observation status for CVA/TIA evaluation -Telemetry monitoring -MRI if possible with pacemaker -Carotid dopplers due to concern for possible stenosis on CTA - this could indicate that patient is having persistent TIAs and would be an indication for itnervention -Echo -Risk stratification with FLP, A1c; will also check TSH and free T4 -ASA daily -Neurology consult -PT/OT/ST/Nutrition Consults  HTN -Allow permissive HTN for now -Treat BP only if >220/120, and then with goal of 15% reduction -Previously on Pindolol, but this was changed by her physician yesterday   HLD -Check FLP -Continue Lipitor but increase from 40 to 80 mg daily empirically for now   DM -Will check A1c -Diet-controlled at home -She does not appear to need SSI at this time   Pacemaker -Needs to be interrogated, but this was not able to be performed in the ER; the floor is planning to consult the [employer] rep -They are also planning to inquire about whether this device is MRI-compatible  Afib -On Amiodarone for rate control -Continue Eliquis  CAD/CHF -She reports some chest fullness -Will order serial troponins -Consider cardiology consult, although her issues currently appear to be more likely related to CVA     DVT prophylaxis:  Eliquis Code Status: DNR - confirmed with patient/family Family Communication: Son present throughout evaluation Disposition Plan:  Home once clinically improved Consults called: Neurology; PT/OT/ST/SW/Nutrition  Admission status: It is my clinical opinion that referral for OBSERVATION is reasonable and necessary in this patient based on the above information provided. The aforementioned taken together are felt to place the patient at high risk for further clinical deterioration. However it is anticipated that the patient may be medically stable for discharge  from the hospital within 24 to 48 hours.     Karmen Bongo MD [facility]   How to contact the [facility] Attending or Consulting provider [place] or covering provider during after hours [place], for this patient?  1. Check the care team in [facility] and look for a) attending/consulting [facility] provider listed and b) the [facility] team listed 2. Log into [email or web address] and use [place]'s universal password to access. If you do not have the password, please contact the hospital operator. 3. Locate the [facility] provider you are looking for under [facility] and page to a number that you can be directly reached. 4. If you still have difficulty reaching the provider, please page the [facility] (Director on Call) for the Hospitalists listed on amion for assistance.   04/24/2018, 11:25 AM

## 2018-04-24 NOTE — Consult Note (Addendum)
[facility] TeleNeurology Consult Services   Date of Service:   04/24/2018 05:52:48  Impression:     .  CVA-possibly more than one--questionable posterior circulation with ataxia, vision loss, weakness/numbness, incoordination  Comments: While the patient had initial symptoms to occur 2 days ago per her report, there have been more symptoms over last several hours.  Mechanism of Stroke: Possible Thromboembolic Possible Cardioembolic Not Clear  Metrics: Last Known Well: 04/22/2018 07:00:00 (approximately.  Patient tells me that she awoke 2 days ago and noticed vision change). [facility] Notification Time: 04/24/2018 05:51:51 Arrival Time: 04/24/2018 05:51:00 Stamp Time: 04/24/2018 05:52:48 Time First Login Attempt: 04/24/2018 05:53:00 Video Start Time: 04/24/2018 05:53:00  Symptoms: left vision loss, ataxia, slurred speech, left sided numbness/weakness NIHSS Start Assessment Time: 04/24/2018 05:56:00 Patient is not a candidate for tPA. Patient was not deemed candidate for tPA thrombolytics because of last seen well more than 4.5 hours ago, use of eliquis 5mg  bid (last dose last night). Video End Time: 04/24/2018 06:20:00  CT head showed no acute hemorrhage or acute core infarct.  Advanced imaging CTA head and neck to be obtained Advanced imaging CTP to be obtained.   Radiologist was not called back for review of advanced imaging because Not performed yet.  If abnormal, then would consult neuro IR.  ED Physician notified of diagnostic impression and management plan on 04/24/2018 06:30:00  Our recommendations are outlined below.  Recommendations:     .  Activate Stroke Protocol Admission/Order Set     .  Stroke/Telemetry Floor     .  Neuro Checks     .  Bedside Swallow Eval     .  DVT Prophylaxis     .  IV Fluids, Normal Saline     .  Head of Bed Below 30 Degrees     .  Euglycemia and Avoid Hyperthermia (PRN Acetaminophen)     .  Currently on  eliquis. Would hold until MRI complete due to concern for hemorrhagic conversion.      check flp, hga1c    Sign Out:     .  Discussed with Emergency Department Provider    ------------------------------------------------------------------------------  History of Present Illness: Patient is a 83 year old Female.  Patient was brought by private transportation with symptoms of left vision loss, ataxia, slurred speech, left sided numbness/weakness  83yo female who lives alone, has h/o afib with recent cardioversion, and on eliquis 5mg  bid (last dose last evening) presented to the ER today complaining of left vision loss, ataxia, slurred speech since Wednesday (04/22/18). She contacted her PCP and spoke to a nurse yesterday and was supposed to go to see her pcp today. She also contacted her son yesterday evening and told him of her symptoms. He came to see her. He noted the slurred speech and her difficulty walking. Patient went to bed at 23:00pm last night. When she awoke at 3am this morning she noted left sided weakness and tingling that were apparently new. She called her son who brought her to the er.  CT head showed no acute hemorrhage or acute core infarct.  Last seen normal was beyond 4.5 hours of presentation. There is history of Recent Anticoagulants.  Examination: BP(147/56mm hg), Blood Glucose(116mg /dL) 1A: Level of Consciousness - Alert; keenly responsive + 0 1B: Ask Month and Age - Both Questions Right + 0 1C: Blink Eyes & Squeeze Hands - Performs Both Tasks + 0 2: Test Horizontal Extraocular Movements - Normal + 0 3: Test Visual Fields -  Partial Hemianopia + 1 4: Test Facial Palsy (Use Grimace if Obtunded) - Normal symmetry + 0 5A: Test Left Arm Motor Drift - Drift, but doesn't hit bed + 1 5B: Test Right Arm Motor Drift - No Drift for 10 Seconds + 0 6A: Test Left Leg Motor Drift - Drift, but doesn't hit bed + 1 6B: Test Right Leg Motor Drift - No Drift for 5 Seconds + 0 7:  Test Limb Ataxia (FNF/Heel-Shin) - Ataxia in 2 Limbs + 2 8: Test Sensation - Mild-Moderate Loss: Less Sharp/More Dull + 1 9: Test Language/Aphasia - Normal; No aphasia + 0 10: Test Dysarthria - Mild-Moderate Dysarthria: Slurring but can be understood + 1 11: Test Extinction/Inattention - No abnormality + 0  NIHSS Score: 7  Patient was informed the Neurology Consult would happen via TeleHealth consult by way of interactive audio and video telecommunications and consented to receiving care in this manner.  Due to the immediate potential for life-threatening deterioration due to underlying acute neurologic illness, I spent 35 minutes providing critical care. This time includes time for face to face visit via telemedicine, review of medical records, imaging studies and discussion of findings with providers, the patient and/or family.   Dr Smith Mince   [facility] [phone]  Case [id number]  Addendum: I reached back out to ER physician.  Since there in no CTP at this facility, he has elected to transfer patient emergently to [facility] where she can have all her studies performed at same time.  He is contacting neurologist who is working at that hospital this morning.  I told him to call me back if any further questions, concerns, or if we could help in any way.

## 2018-04-24 NOTE — ED Notes (Signed)
Report called to charge nurse at [facility] with Gretta Cool, Bartholomew

## 2018-04-24 NOTE — Progress Notes (Signed)
Pacemaker is not MRI conditional.  Spoke with Meredith Gonzalez from [facility] to confirm. Pt has the [employer] generator which is not MRI conditional and pt cannot have a MRI.

## 2018-04-24 NOTE — Evaluation (Signed)
Physical Therapy Evaluation Patient Details Name: Meredith Gonzalez MRN: [id number] DOB: [birth year] Today's Date: 04/24/2018   History of Present Illness  Meredith Gonzalez is a 83 y.o. female with medical history significant of diet-controlled DM; CAD; CVA in 2000; hypothyroidism; HTN; pacemaker placement; and CHF presenting with weakness and ataxia.   Clinical Impression  Patient presents with decreased independence with mobility due to L side weakness and numbness, ataxic gait with long history of decreased balance with falls with injury.  In addition she endorses double vision and is dysarthric.  She was mobilizing with rollator in the home and cane in community previously living alone in retirement community.  Currently needing mod to max A for transfers and gait with RW up to 50' with increased times and multiple LOB.  She will benefit from skilled PT in the acute setting and will need post acute inpatient rehab prior to d/c home.      Follow Up Recommendations SNF;Supervision/Assistance - 24 hour    Equipment Recommendations  None recommended by PT    Recommendations for Other Services       Precautions / Restrictions Precautions Precautions: Fall Precaution Comments: has fallen a lot with injuries over past 3 years recent in Sept broke her neck      Mobility  Bed Mobility Overal bed mobility: Needs Assistance Bed Mobility: Supine to Sit;Sit to Supine     Supine to sit: HOB elevated;Min guard Sit to supine: Mod assist   General bed mobility comments: use of rail and increased time to come upright with assist for safety; to supine assist for LE's into bed  Transfers Overall transfer level: Needs assistance Equipment used: Rolling walker (2 wheeled) Transfers: Sit to/from Stand Sit to Stand: Mod assist;Max assist         General transfer comment: heavy lifting help from EOB and assist to balance  Ambulation/Gait Ambulation/Gait assistance: Mod assist;Max assist Gait  Distance (Feet): 50 Feet Assistive device: Rolling walker (2 wheeled) Gait Pattern/deviations: Step-to pattern;Step-through pattern;Ataxic;Narrow base of support;Decreased stride length     General Gait Details: multiple LOB max A to recover; cues for wider BOS as R foot crossing over in middle and ataxic L foot placement.  assist for walker management and cues for eyes on stationary target as dizzy and with double vision  Stairs            Wheelchair Mobility    Modified Rankin (Stroke Patients Only) Modified Rankin (Stroke Patients Only) Pre-Morbid Rankin Score: Slight disability Modified Rankin: Moderately severe disability     Balance Overall balance assessment: Needs assistance;History of Falls Sitting-balance support: Feet supported Sitting balance-Leahy Scale: Fair     Standing balance support: Bilateral upper extremity supported Standing balance-Leahy Scale: Poor Standing balance comment: heavy UE reliance and at least min A For static standing                             Pertinent Vitals/Pain Pain Assessment: 0-10 Pain Score: 4  Pain Location: neck (up to 9 at end of session) Pain Descriptors / Indicators: Aching;Sharp Pain Intervention(s): Repositioned;Heat applied    Home Living Family/patient expects to be discharged to:: Private residence Living Arrangements: Alone Available Help at Discharge: Family Type of Home: House Home Access: Level entry     Home Layout: One Topaz: Environmental consultant - 4 wheels;Walker - 2 wheels;Bedside commode;Shower seat - built in;Grab bars - tub/shower Additional Comments: living in "[facility]" son  states they do have therapy, but not sure if they have SNF    Prior Function Level of Independence: Independent with assistive device(s)               Hand Dominance   Dominant Hand: Right    Extremity/Trunk Assessment   Upper Extremity Assessment Upper Extremity Assessment: LUE  deficits/detail LUE Deficits / Details: AROM WFL, strength shoulder flexion 3+/5, elbow flexion 3+/5 LUE Sensation: decreased light touch LUE Coordination: decreased fine motor    Lower Extremity Assessment Lower Extremity Assessment: LLE deficits/detail LLE Deficits / Details: AROM WFL, strength hip flexion 3/5, knee extension 4/5, ankle DF 4-/5 LLE Sensation: decreased light touch LLE Coordination: decreased gross motor       Communication   Communication: Expressive difficulties(dyarthric)  Cognition Arousal/Alertness: Awake/alert Behavior During Therapy: WFL for tasks assessed/performed Overall Cognitive Status: Impaired/Different from baseline Area of Impairment: Safety/judgement;Attention                   Current Attention Level: Sustained     Safety/Judgement: Decreased awareness of deficits;Decreased awareness of safety            General Comments General comments (skin integrity, edema, etc.): son in room and reports pt to [facility] previously after one fall with hip fracture, but ended up having to pay out of pocket due to changes in admission status at [facility]    Exercises     Assessment/Plan    PT Assessment Patient needs continued PT services  PT Problem List Decreased strength;Decreased mobility;Decreased coordination;Decreased balance;Decreased knowledge of use of DME;Impaired sensation;Decreased knowledge of precautions       PT Treatment Interventions DME instruction;Functional mobility training;Balance training;Patient/family education;Gait training;Therapeutic exercise;Therapeutic activities;Neuromuscular re-education    PT Goals (Current goals can be found in the Care Plan section)  Acute Rehab PT Goals Patient Stated Goal: to return to independent PT Goal Formulation: With patient/family Time For Goal Achievement: 05/08/18 Potential to Achieve Goals: Fair    Frequency Min 4X/week   Barriers to discharge        Co-evaluation                AM-PAC PT "6 Clicks" Mobility  Outcome Measure Help needed turning from your back to your side while in a flat bed without using bedrails?: A Little Help needed moving from lying on your back to sitting on the side of a flat bed without using bedrails?: A Little Help needed moving to and from a bed to a chair (including a wheelchair)?: A Lot Help needed standing up from a chair using your arms (e.g., wheelchair or bedside chair)?: A Lot Help needed to walk in hospital room?: A Lot Help needed climbing 3-5 steps with a railing? : Total 6 Click Score: 13    End of Session Equipment Utilized During Treatment: Gait belt Activity Tolerance: Patient tolerated treatment well Patient left: in bed;with call bell/phone within reach;with bed alarm set;with family/visitor present   PT Visit Diagnosis: Other abnormalities of gait and mobility (R26.89);Hemiplegia and hemiparesis Hemiplegia - Right/Left: Left Hemiplegia - dominant/non-dominant: Non-dominant Hemiplegia - caused by: Cerebral infarction    Time: 0814-4818 PT Time Calculation (min) (ACUTE ONLY): 28 min   Charges:   PT Evaluation $PT Eval Moderate Complexity: 1 Mod PT Treatments $Gait Training: 8-22 mins        Magda Kiel, [place] [facility] [phone] 04/24/2018   Reginia Naas 04/24/2018, 4:54 PM

## 2018-04-24 NOTE — ED Provider Notes (Addendum)
[facility] EMERGENCY DEPARTMENT Provider Note   CSN: [id number] Arrival date & time: 04/24/18  4010     History   Chief Complaint Chief Complaint  Patient presents with  . Dizziness    HPI Meredith Gonzalez is a 83 y.o. female.  Patient is brought to the emergency department by her son for evaluation of dizziness.  Patient reports that she was not feeling well all day yesterday.  She woke up feeling dizzy.  She reports that she has been having some difficulty getting around because she is off balance.  She has also noticed blurred vision around noon.  Patient reports that she was able to play games on the computer but could not read text.  She had to use another piece of paper in order to underline the words when she was reading, otherwise everything got "jumbled up".  She called her son at this point.  He suggested she call her doctor.  She called her cardiologist who suggested she stop taking her pindolol and decrease amiodarone to once a day.  Son reports that during that phone call, however, he noticed that her speech was slightly slurred.  She reports that this morning she was unable to get out of bed.  Son went to her and found her to be extremely off balance and uncoordinated.  Speech that was slightly slurred yesterday is much worse this morning.  He brought her directly to the ER.        Past Medical History:  Diagnosis Date  . Anemia   . Anxiety   . Arthritis    "all over" (02/11/2018)  . Basal cell carcinoma    "left leg" (02/11/2018)  . Cervical spine fracture ([place]) 12/2017   "C1-2"  . CHF (congestive heart failure) ([place])   . Chronic bronchitis ([place])   . Chronic neck pain    "since I broke my neck 6-8 wk ago" (02/11/2018)  . Diabetic peripheral neuropathy ([place])   . Fibromyalgia   . Fracture of right humerus   . Generalized weakness   . Headache    "weekly" (02/11/2018)  . History of blood transfusion    "related to one of my femur surgeries"  (02/11/2018)  . History of echocardiogram    Echo 8/18: EF 50-55, no RWMA, Gr 1 DD, calcified AV leaflets, MAC, trivial MR, mod LAE, PASP 37  . Hypertension   . Hypothyroidism   . Incontinence of urine   . Ischemic stroke ([place]) 2000   "lost part of the vision in my right eye" (02/11/2018)  . Major depression, chronic   . Myocardial infarction ([place]) 06/2016  . Pacemaker   . Recurrent falls   . Syncope and collapse   . Type 2 diabetes, diet controlled [facility])     Patient Active Problem List   Diagnosis Date Noted  . CAD (coronary artery disease) 08/07/2016  . (HFpEF) heart failure with preserved ejection fraction ([place]) 08/07/2016  . Ischemic cardiomyopathy 08/07/2016  . Chest pain   . NSTEMI (non-ST elevated myocardial infarction) ([place])   . Status post coronary artery stent placement   . Abnormal stress test   . SOB (shortness of breath)   . Multifocal pneumonia 07/13/2016  . Elevated troponin 07/13/2016  . Leukocytosis   . Pacemaker 05/17/2014  . Complications, pacemaker cardiac, mechanical 12/31/2012  . Edema extremities 10/08/2012  . Persistent atrial fibrillation 10/08/2012  . Hypertension 10/08/2012  . Neuropathy 10/08/2012  . Diabetes ([place]) 10/08/2012  . Depression 09/08/2012  .  Anemia 08/08/2012  . Knee fracture, left 08/07/2012  . Recurrent falls 08/07/2012  . Syncope 08/07/2012  . SDH (subdural hematoma) ([place]) 08/07/2012    Past Surgical History:  Procedure Laterality Date  . ABDOMINAL HYSTERECTOMY    . ANKLE FRACTURE SURGERY Right   . BASAL CELL CARCINOMA EXCISION Left    "leg"  . CARDIAC CATHETERIZATION  ~ 2014  . CARDIOVERSION N/A 02/13/2018   Procedure: CARDIOVERSION;  Surgeon: Sueanne Margarita, MD;  Location: [facility] ENDOSCOPY;  Service: Cardiovascular;  Laterality: N/A;  . CARDIOVERSION N/A 03/27/2018   Procedure: CARDIOVERSION;  Surgeon: Lelon Perla, MD;  Location: [facility] ENDOSCOPY;  Service: Cardiovascular;  Laterality: N/A;  . CARPAL TUNNEL RELEASE      bilateral  . CATARACT EXTRACTION W/ INTRAOCULAR LENS  IMPLANT, BILATERAL Bilateral   . CHOLECYSTECTOMY OPEN    . COLONOSCOPY    . CORONARY ANGIOPLASTY WITH STENT PLACEMENT  06/2016  . CORONARY STENT INTERVENTION N/A 07/19/2016   Procedure: Coronary Stent Intervention;  Surgeon: Peter M Martinique, MD;  Location: [facility] CV LAB;  Service: Cardiovascular;  Laterality: N/A;  . EYE SURGERY     right eye catarace/lens implant  . FEMUR FRACTURE SURGERY Bilateral   . FRACTURE SURGERY    . GASTRIC BYPASS    . INSERT / REPLACE / REMOVE PACEMAKER  ~ 2012  . LEAD REVISION N/A 01/01/2013   Procedure: LEAD REVISION;  Surgeon: Evans Lance, MD;  Location: [facility] CATH LAB;  Service: Cardiovascular;  Laterality: N/A;  . LEFT HEART CATH AND CORONARY ANGIOGRAPHY N/A 07/19/2016   Procedure: Left Heart Cath and Coronary Angiography;  Surgeon: Peter M Martinique, MD;  Location: [facility] CV LAB;  Service: Cardiovascular;  Laterality: N/A;  . OVARIAN CYST SURGERY     "one side only"  . TEE WITHOUT CARDIOVERSION N/A 02/13/2018   Procedure: TRANSESOPHAGEAL ECHOCARDIOGRAM (TEE);  Surgeon: Sueanne Margarita, MD;  Location: [facility] ENDOSCOPY;  Service: Cardiovascular;  Laterality: N/A;  . TONSILLECTOMY       OB History   No obstetric history on file.      Home Medications    Prior to Admission medications   Medication Sig Start Date End Date Taking? Authorizing Provider  acetaminophen (TYLENOL) 500 MG tablet Take 500 mg by mouth every 6 (six) hours as needed for moderate pain.     [provider]  albuterol (PROVENTIL HFA;VENTOLIN HFA) 108 (90 Base) MCG/ACT inhaler Inhale 2 puffs into the lungs every 6 (six) hours as needed for wheezing or shortness of breath.  02/02/18   [provider]  amiodarone (PACERONE) 200 MG tablet Take 1 tablet (200 mg total) by mouth every morning. 04/23/18   Evans Lance, MD  ammonium lactate (LAC-HYDRIN) 12 % lotion Apply 1 application topically daily. In the morning  after bathing 07/05/16   [provider]  apixaban (ELIQUIS) 5 MG TABS tablet Take 1 tablet (5 mg total) by mouth 2 (two) times daily. Prescription for free 30 day supply 02/14/18   Kroeger, Lorelee Cover., PA-C  atorvastatin (LIPITOR) 40 MG tablet Take 1 tablet (40 mg total) by mouth daily. 08/07/16 03/27/18  Richardson Dopp T, PA-C  Calcium Citrate-Vitamin D3 315-250 MG-UNIT TABS Take 1 tablet by mouth 2 (two) times daily.    [provider]  diphenhydrAMINE (BENADRYL ALLERGY) 25 MG tablet Take 25 mg by mouth every 8 (eight) hours as needed (itching/allergies.).     [provider]  ferrous sulfate 325 (65 FE) MG tablet Take  1 tablet (325 mg total) by mouth 2 (two) times daily with a meal. 07/20/16   Thurnell Lose, MD  flintstones complete (FLINTSTONES) 60 MG chewable tablet Chew 1 tablet by mouth daily.    [provider]  FLUoxetine (PROZAC) 20 MG capsule Take 60 mg by mouth daily.  09/12/15   [provider]  fluticasone (FLONASE) 50 MCG/ACT nasal spray Place 1 spray into both nostrils daily as needed (allergies.).  12/08/17   [provider]  furosemide (LASIX) 20 MG tablet Take 20 mg by mouth daily.    [provider]  furosemide (LASIX) 40 MG tablet Take 40 mg by mouth daily.    [provider]  gabapentin (NEURONTIN) 300 MG capsule Take 300-600 mg by mouth See admin instructions. Take 1 capsule (300 mg) by mouth in the mornning & take 2 capsules (600 mg) by mouth at night    [provider]  Melatonin 10 MG TABS Take 40 mg by mouth at bedtime.    [provider]  Multiple Vitamins-Minerals (MULTIVITAMIN ADULT) TABS Take 1 tablet by mouth daily.    [provider]  nitroGLYCERIN (NITROSTAT) 0.4 MG SL tablet Place 1 tablet (0.4 mg total) under the tongue every 5 (five) minutes as needed for chest pain. 10/11/16 03/17/18  Fay Records, MD  pantoprazole (PROTONIX) 40 MG tablet Take 1 tablet (40 mg total) by  mouth daily. Can switch to any PPI 07/20/16   Thurnell Lose, MD  pindolol (VISKEN) 5 MG tablet Take 10 mg in the AM and 5 mg in the PM daily by mouth 03/02/18   Richardson Dopp T, PA-C  potassium chloride (K-DUR) 10 MEQ tablet Take 2 tablets (20 mEq total) by mouth daily. 03/19/18 06/17/18  Richardson Dopp T, PA-C  pramipexole (MIRAPEX) 0.25 MG tablet Take 0.25 mg by mouth every morning. 05/17/16   [provider]  vitamin B-12 (CYANOCOBALAMIN) 1000 MCG tablet Take 1 tablet (1,000 mcg total) by mouth daily. 07/20/16   Thurnell Lose, MD    Family History Family History  Problem Relation Age of Onset  . Congestive Heart Failure Mother   . Heart attack Father   . Alcoholism Father   . Alcoholism Sister   . Alcoholism Brother   . Alcoholism Brother   . Throat cancer Brother     Social History Social History   Tobacco Use  . Smoking status: Former Smoker    Packs/day: 0.50    Years: 4.00    Pack years: 2.00    Types: Cigarettes    Last attempt to quit: 08/07/1972    Years since quitting: 45.7  . Smokeless tobacco: Never Used  Substance Use Topics  . Alcohol use: Yes    Frequency: Never    Comment: 02/11/2018 "couple drinks/year; if that"  . Drug use: Never     Allergies   Valium [diazepam]; Codeine; Darifenacin; Darvon [propoxyphene hcl]; Daypro [oxaprozin]; Enablex [darifenacin hydrobromide er]; Oxycodone; Propoxyphene; Risperdal [risperidone]; Talwin [pentazocine]; and Vicodin [hydrocodone-acetaminophen]   Review of Systems Review of Systems  Neurological: Positive for dizziness and headaches.  All other systems reviewed and are negative.    Physical Exam Updated Vital Signs BP 139/69   Pulse 79   Resp 13   SpO2 98%   Physical Exam Vitals signs and nursing note reviewed.  Constitutional:      General: She is not in acute distress.    Appearance: Normal appearance. She is well-developed.  HENT:  Head: Normocephalic and atraumatic.     Right Ear:  Hearing normal.     Left Ear: Hearing normal.     Nose: Nose normal.  Eyes:     General: No visual field deficit.    Conjunctiva/sclera: Conjunctivae normal.     Pupils: Pupils are equal, round, and reactive to light.  Neck:     Musculoskeletal: Normal range of motion and neck supple.  Cardiovascular:     Rate and Rhythm: Regular rhythm.     Heart sounds: S1 normal and S2 normal. No murmur. No friction rub. No gallop.   Pulmonary:     Effort: Pulmonary effort is normal. No respiratory distress.     Breath sounds: Normal breath sounds.  Chest:     Chest wall: No tenderness.  Abdominal:     General: Bowel sounds are normal.     Palpations: Abdomen is soft.     Tenderness: There is no abdominal tenderness. There is no guarding or rebound. Negative signs include Murphy's sign and McBurney's sign.     Hernia: No hernia is present.  Musculoskeletal: Normal range of motion.  Skin:    General: Skin is warm and dry.     Findings: No rash.  Neurological:     Mental Status: She is alert and oriented to person, place, and time.     GCS: GCS eye subscore is 4. GCS verbal subscore is 5. GCS motor subscore is 6.     Cranial Nerves: Facial asymmetry (Left droop) present. No cranial nerve deficit.     Sensory: Sensory deficit (Decreased sensation to light touch left face, left arm, left leg) present.     Motor: Weakness (Left arm and leg) present.     Coordination: Coordination abnormal. Finger-Nose-Finger Test abnormal and Heel to [employer] abnormal.     Comments: No visual field cut Slight left facial droop with slight dysarthria Decreased strength left upper extremity with ataxia and pronator drift Decreased strength left lower extremity with ataxia and difficulty heel-to-shin Decreased sensation to light touch left side  Psychiatric:        Speech: Speech normal.        Behavior: Behavior normal.        Thought Content: Thought content normal.      ED Treatments / Results   Labs (all labs ordered are listed, but only abnormal results are displayed) Labs Reviewed  PROTIME-INR - Abnormal; Notable for the following components:      Result Value   Prothrombin Time 15.7 (*)    All other components within normal limits  APTT - Abnormal; Notable for the following components:   aPTT 43 (*)    All other components within normal limits  CBC - Abnormal; Notable for the following components:   Hemoglobin 11.4 (*)    MCHC 29.2 (*)    RDW 16.2 (*)    All other components within normal limits  COMPREHENSIVE METABOLIC PANEL - Abnormal; Notable for the following components:   Glucose, Bld 116 (*)    BUN 34 (*)    Creatinine, Ser 1.11 (*)    Calcium 8.7 (*)    Albumin 3.4 (*)    GFR calc non Af Amer 47 (*)    GFR calc Af Amer 54 (*)    All other components within normal limits  ETHANOL  DIFFERENTIAL  TROPONIN I  RAPID URINE DRUG SCREEN, HOSP PERFORMED  URINALYSIS, ROUTINE W REFLEX MICROSCOPIC    EKG EKG Interpretation  Date/Time:  Friday April 24 2018 05:33:54 EST Ventricular Rate:  80 PR Interval:    QRS Duration: 159 QT Interval:  433 QTC Calculation: 500 R Axis:   -70 Text Interpretation:  Atrial-paced rhythm RBBB and LAFB Probable left ventricular hypertrophy Baseline wander in lead(s) I No significant change since last tracing Confirmed by Orpah Greek [id number]) on 04/24/2018 5:46:43 AM   Radiology Ct Head Wo Contrast  Result Date: 04/24/2018 CLINICAL DATA:  Focal neuro deficit.  Headache. EXAM: CT HEAD WITHOUT CONTRAST TECHNIQUE: Contiguous axial images were obtained from the base of the skull through the vertex without intravenous contrast. COMPARISON:  12/28/2017 FINDINGS: Brain: No evidence of acute infarction, hemorrhage, hydrocephalus, extra-axial collection or mass lesion/mass effect. Stable small sulcal calcification along the right parietal convexity, incidental in isolation. Moderate chronic small vessel ischemia in the cerebral  white matter. Mild for age volume loss. Vascular: Atherosclerotic calcification Skull: No acute or aggressive finding. Prominent right atlantooccipital degenerative spurring. Prior C1 ring fracture that is not covered on this study. Sinuses/Orbits: Negative IMPRESSION: No acute finding or change from 2019. Electronically Signed   By: Monte Fantasia M.D.   On: 04/24/2018 06:23    Procedures Procedures (including critical care time)  Medications Ordered in ED Medications - No data to display   Initial Impression / Assessment and Plan / ED Course  I have reviewed the triage vital signs and the nursing notes.  Pertinent labs & imaging results that were available during my care of the patient were reviewed by me and considered in my medical decision making (see chart for details).    Patient presents to the emergency department for evaluation of dizziness.  Further evaluation, however, reveals left-sided hemiparesis.  Timing is unclear.  It was present since at least yesterday morning and possibly the day before.  Patient lives alone and "does not want to bother her son".  She reports that she has been off balance all week.  She woke up not feeling well Thursday, January 30.  Sometime during the day she noticed that she was having trouble seeing, trouble walking and acutely became very dizzy.  This seems to be a significant acute worsening less than 24 hours ago.  Patient's initial stroke screen work-up has not revealed any abnormality.  Discussed with tele-neurologist.  Recommends CT angio head and neck and CT perfusion study.  Will require hospitalization but would like to see the results of these images to make sure that she does not have a large vessel occlusion or posterior circulation problem prior to admission.    Addendum: It has come to my attention that we cannot do a CT perfusion study here in this ER.  The CT tech informs me that if we do the CT angiography here, they will not be able to  CT perfusion echo because of the amount of IV contrast.  Arrangements are therefore being made for her to be transferred directly to the ER at [facility] to have CT Angio of head and neck and CT perfusion study.  Discussed with Dr. Rory Percy, is aware of the patient.  Recommends that the patient be called out as a code stroke upon arrival to the ER so he can evaluate her and her images.  Patient reevaluated, no change in her status.  Appropriate for transport at this time.     CRITICAL CARE Performed by: Orpah Greek   Total critical care time: 45 minutes  Critical care time was exclusive of separately billable procedures and  treating other patients.  Critical care was necessary to treat or prevent imminent or life-threatening deterioration.  Critical care was time spent personally by me on the following activities: development of treatment plan with patient and/or surrogate as well as nursing, discussions with consultants, evaluation of patient's response to treatment, examination of patient, obtaining history from patient or surrogate, ordering and performing treatments and interventions, ordering and review of laboratory studies, ordering and review of radiographic studies, pulse oximetry and re-evaluation of patient's condition.    Final Clinical Impressions(s) / ED Diagnoses   Final diagnoses:  Cerebrovascular accident (CVA), unspecified mechanism [facility])    ED Discharge Orders    None       Pollina, Gwenyth Allegra, MD 04/24/18 0240    Orpah Greek, MD 04/24/18 [id number]    Orpah Greek, MD 04/24/18 [phone]

## 2018-04-24 NOTE — Progress Notes (Signed)
Bilateral carotid duplex completed - Preliminary results in Chart Review CV [place], RVS 04/24/2018 2:58 PM

## 2018-04-24 NOTE — ED Notes (Signed)
ED Provider at bedside. 

## 2018-04-24 NOTE — ED Notes (Signed)
Teleneuro in progess

## 2018-04-24 NOTE — Progress Notes (Signed)
  Echocardiogram 2D Echocardiogram has been performed.  Meredith Gonzalez 04/24/2018, 2:14 PM

## 2018-04-24 NOTE — ED Notes (Signed)
Patient transported to CT 

## 2018-04-25 ENCOUNTER — Observation Stay (HOSPITAL_COMMUNITY): Payer: Medicare Other

## 2018-04-25 DIAGNOSIS — I2510 Atherosclerotic heart disease of native coronary artery without angina pectoris: Secondary | ICD-10-CM | POA: Diagnosis not present

## 2018-04-25 DIAGNOSIS — I6521 Occlusion and stenosis of right carotid artery: Secondary | ICD-10-CM

## 2018-04-25 DIAGNOSIS — E039 Hypothyroidism, unspecified: Secondary | ICD-10-CM | POA: Diagnosis not present

## 2018-04-25 DIAGNOSIS — I639 Cerebral infarction, unspecified: Secondary | ICD-10-CM | POA: Diagnosis not present

## 2018-04-25 LAB — LIPID PANEL
Cholesterol: 122 mg/dL (ref 0–200)
HDL: 59 mg/dL (ref >40)
LDL Cholesterol: 52 mg/dL (ref 0–99)
Total CHOL/HDL Ratio: 2.1 RATIO
Triglycerides: 53 mg/dL (ref <150)
VLDL: 11 mg/dL (ref 0–40)

## 2018-04-25 LAB — HEMOGLOBIN A1C
HEMOGLOBIN A1C: 6.3 % — AB (ref 4.8–5.6)
Mean Plasma Glucose: 134.11 mg/dL

## 2018-04-25 LAB — TROPONIN I: Troponin I: <0.03 ng/mL (ref <0.03)

## 2018-04-25 MED ORDER — APIXABAN 5 MG PO TABS
5.0000 mg | ORAL_TABLET | Freq: Two times a day (BID) | ORAL | Status: DC
  Filled 2018-04-25: qty 1

## 2018-04-25 MED ORDER — SIMETHICONE 80 MG PO CHEW
80.0000 mg | CHEWABLE_TABLET | Freq: Four times a day (QID) | ORAL | Status: DC | PRN
  Administered 2018-04-25 – 2018-04-27 (×4): 80 mg via ORAL
  Filled 2018-04-25 (×4): qty 1

## 2018-04-25 MED ORDER — LEVOTHYROXINE SODIUM 50 MCG PO TABS
50.0000 ug | ORAL_TABLET | Freq: Every day | ORAL | Status: DC
  Administered 2018-04-25 – 2018-04-27 (×3): 50 ug via ORAL
  Filled 2018-04-25 (×3): qty 1

## 2018-04-25 MED ORDER — APIXABAN 2.5 MG PO TABS
2.5000 mg | ORAL_TABLET | Freq: Two times a day (BID) | ORAL | Status: AC
  Administered 2018-04-25: 2.5 mg via ORAL
  Filled 2018-04-25: qty 1

## 2018-04-25 NOTE — Progress Notes (Addendum)
[facility] PROGRESS NOTE  Meredith Gonzalez QVZ:[id number] DOB: Jul 07, 1935 DOA: 04/24/2018 PCP: Sinclair Ship, MD  Assessment/Plan:  TIA/CVA. Patient with a number of progressive neurologic symptoms concerning for TIA/CVA;  At [facility] concern for posterior circulation stroke and she was transferred to [facility]. tPA not given as outside window.  CTA was negative for large vessel occlusion. Echo with EF 60%.  No events on tele. No MRI due to pacer. CT cerebral perfusion reveals 60% narrowing of right ICA. TSH 66, free t4 0.44.  Evaluated by neuro who opine symptoms could be related to right carotid and recommended stroke/tia work up and vascular consult. PT recommending SNF and OT rec CIR -ASA daily -will resume eliquis as vascular procedure deferred to later time.  -atrovastatin 80mg  - appreciate Neurology consult -await vascular recommendations  HTN. Controlled. Home meds include lasix -hold lasix for now -monitor closely -resume home med when indicated  HLD -Continue Lipitor but increase from 40 to 80 mg   DM - A1c  6.3 -Diet-controlled at home -monitor   Pacemaker -Not MRI-compatible -ekc with atrial paced rbbb  Afib -On Amiodarone for rate control -holding Eliquis -monitor closely  CAD/CHF. No complaints chest discomfort this am. Troponin slightly elevated but flat. No events on tele -continue home meds as noted aboe -monitor closely  Code Status: dnr Family Communication: none present Disposition Plan: to be determined   Consultants:  [facility] neurology  Procedures:  echo  Antibiotics:    HPI/Subjective: Denies pain discomfort. Lying in bed in no acute distress  Admitted for stroke work up after tingling left arm/leg with chest "fullness". Vascular called for 60% narrowing of right ICA  Objective: Vitals:   04/25/18 0859 04/25/18 1101  BP: (!) 111/53 123/66  Pulse: 72 68  Resp: 16 16  Temp: 98 F (36.7 C) 98.2 F (36.8 C)  SpO2: 98%  99%    Intake/Output Summary (Last 24 hours) at 04/25/2018 1513 Last data filed at 04/25/2018 0300 Gross per 24 hour  Intake 457.07 ml  Output -  Net 457.07 ml   Filed Weights   04/24/18 1558  Weight: 59.4 kg    Exam:   General:  Awake alert no acute distress   Cardiovascular: rrr no mgr no LE edema  Respiratory: normal effort BS clear bilaterally no wheeze  Abdomen: non-distended +BS no guarding or rebounding  Musculoskeletal: joints without swelling/erythema.   Neuro: speech clear facial symmetry tongue midline bilateral strenght 5/5   Data Reviewed: Basic Metabolic Panel: Recent Labs  Lab 04/24/18 0545  NA 137  K 3.6  CL 103  CO2 28  GLUCOSE 116*  BUN 34*  CREATININE 1.11*  CALCIUM 8.7*   Liver Function Tests: Recent Labs  Lab 04/24/18 0545  AST 24  ALT 14  ALKPHOS 94  BILITOT 0.4  PROT 7.1  ALBUMIN 3.4*   No results for input(s): LIPASE, AMYLASE in the last 168 hours. No results for input(s): AMMONIA in the last 168 hours. CBC: Recent Labs  Lab 04/24/18 0545  WBC 7.2  NEUTROABS 5.4  HGB 11.4*  HCT 39.1  MCV 98.2  PLT 232   Cardiac Enzymes: Recent Labs  Lab 04/24/18 0545 04/24/18 1213 04/24/18 1837 04/25/18 0018  TROPONINI <0.03 <0.03 <0.03 <0.03   BNP (last 3 results) Recent Labs    02/10/18 1519  BNP 550.4*    ProBNP (last 3 results) No results for input(s): PROBNP in the last 8760 hours.  CBG: No results for input(s): GLUCAP in the last  168 hours.  No results found for this or any previous visit (from the past 240 hour(s)).   Studies: Ct Angio Head W Or Wo Contrast  Result Date: 04/24/2018 CLINICAL DATA:  Left-sided weakness and ataxia for 2 days. Evaluate for posterior circulation stroke. EXAM: CT ANGIOGRAPHY HEAD AND NECK CT PERFUSION BRAIN TECHNIQUE: Multidetector CT imaging of the head and neck was performed using the standard protocol during bolus administration of intravenous contrast. Multiplanar CT image  reconstructions and MIPs were obtained to evaluate the vascular anatomy. Carotid stenosis measurements (when applicable) are obtained utilizing NASCET criteria, using the distal internal carotid diameter as the denominator. Multiphase CT imaging of the brain was performed following IV bolus contrast injection. Subsequent parametric perfusion maps were calculated using RAPID software. CONTRAST:  60mL ISOVUE-370 IOPAMIDOL (ISOVUE-370) INJECTION 76% COMPARISON:  Head CT from earlier today FINDINGS: CTA NECK FINDINGS Aortic arch: Atherosclerotic plaque.  3 vessel branching. Right carotid system: Extensive atheromatous plaque at the common carotid bifurcation and ICA bulb. Bulb stenosis measures 60%. No definite ulceration or downstream beading. High-grade ECA origin stenosis. Left carotid system: Moderate calcified plaque at the bifurcation without flow limiting stenosis or ulceration. Vertebral arteries: No proximal subclavian stenosis. Mild left vertebral artery dominance. Both vertebral arteries are widely patent to the dura. Skeleton: Cervical spine degeneration. Remote C1 ring and dens fractures . No bony union at these fractures, but unchanged alignment. Other neck: No acute finding Upper chest: Bronchomalacia with severe of the bronchus intermedius at time of imaging. Streaky opacity in the bilateral upper lungs with air trapping. Review of the MIP images confirms the above findings CTA HEAD FINDINGS Anterior circulation: Atherosclerotic calcification on the carotid siphons. No branch occlusion or flow limiting stenosis. Negative for aneurysm. Posterior circulation: Left dominant vertebral artery. The vertebral and basilar arteries are smooth and widely patent. No branch occlusion or flow limiting stenosis. Negative for aneurysm Venous sinuses: Prominent enhancement within the distal transverse sinuses but no evident fistula or other shunt. Anatomic variants: None significant Delayed phase: Not obtained in the  emergent setting Review of the MIP images confirms the above findings CT Brain Perfusion Findings: CBF (<30%) Volume: 72mL Perfusion (Tmax>6.0s) volume: 33mL IMPRESSION: 1. No emergent large vessel occlusion or infarct/ischemia by CT perfusion. 2. 60% narrowing of the right ICA bulb due to bulky atherosclerotic plaque. 3. Bronchomalacia and advanced right bronchus intermedius airway narrowing with biapical lung opacity at least partially from atelectasis. Electronically Signed   By: Monte Fantasia M.D.   On: 04/24/2018 09:17   Ct Head Wo Contrast  Result Date: 04/25/2018 CLINICAL DATA:  Ataxia, stroke suspected. EXAM: CT HEAD WITHOUT CONTRAST TECHNIQUE: Contiguous axial images were obtained from the base of the skull through the vertex without intravenous contrast. COMPARISON:  Prior imaging 04/24/2018 was negative for large vessel occlusion, or infarct/ischemia. FINDINGS: Brain: No evidence for acute infarction, hemorrhage, mass lesion, hydrocephalus, or extra-axial fluid. Generalized atrophy. Chronic microvascular ischemic change. Incidental RIGHT parietal sulcal calcification. Vascular: Calcification of the cavernous internal carotid arteries consistent with cerebrovascular atherosclerotic disease. No signs of intracranial large vessel occlusion. Skull: Calvarium intact. Sinuses/Orbits: No acute findings. Other: None. IMPRESSION: Atrophy and small vessel disease. No acute intracranial findings. Stable appearance compared with yesterday's imaging. Electronically Signed   By: Staci Righter M.D.   On: 04/25/2018 11:05   Ct Head Wo Contrast  Result Date: 04/24/2018 CLINICAL DATA:  Focal neuro deficit.  Headache. EXAM: CT HEAD WITHOUT CONTRAST TECHNIQUE: Contiguous axial images were obtained from the base  of the skull through the vertex without intravenous contrast. COMPARISON:  12/28/2017 FINDINGS: Brain: No evidence of acute infarction, hemorrhage, hydrocephalus, extra-axial collection or mass lesion/mass  effect. Stable small sulcal calcification along the right parietal convexity, incidental in isolation. Moderate chronic small vessel ischemia in the cerebral white matter. Mild for age volume loss. Vascular: Atherosclerotic calcification Skull: No acute or aggressive finding. Prominent right atlantooccipital degenerative spurring. Prior C1 ring fracture that is not covered on this study. Sinuses/Orbits: Negative IMPRESSION: No acute finding or change from 2019. Electronically Signed   By: Monte Fantasia M.D.   On: 04/24/2018 06:23   Ct Angio Neck W And/or Wo Contrast  Result Date: 04/24/2018 CLINICAL DATA:  Left-sided weakness and ataxia for 2 days. Evaluate for posterior circulation stroke. EXAM: CT ANGIOGRAPHY HEAD AND NECK CT PERFUSION BRAIN TECHNIQUE: Multidetector CT imaging of the head and neck was performed using the standard protocol during bolus administration of intravenous contrast. Multiplanar CT image reconstructions and MIPs were obtained to evaluate the vascular anatomy. Carotid stenosis measurements (when applicable) are obtained utilizing NASCET criteria, using the distal internal carotid diameter as the denominator. Multiphase CT imaging of the brain was performed following IV bolus contrast injection. Subsequent parametric perfusion maps were calculated using RAPID software. CONTRAST:  65mL ISOVUE-370 IOPAMIDOL (ISOVUE-370) INJECTION 76% COMPARISON:  Head CT from earlier today FINDINGS: CTA NECK FINDINGS Aortic arch: Atherosclerotic plaque.  3 vessel branching. Right carotid system: Extensive atheromatous plaque at the common carotid bifurcation and ICA bulb. Bulb stenosis measures 60%. No definite ulceration or downstream beading. High-grade ECA origin stenosis. Left carotid system: Moderate calcified plaque at the bifurcation without flow limiting stenosis or ulceration. Vertebral arteries: No proximal subclavian stenosis. Mild left vertebral artery dominance. Both vertebral arteries are  widely patent to the dura. Skeleton: Cervical spine degeneration. Remote C1 ring and dens fractures . No bony union at these fractures, but unchanged alignment. Other neck: No acute finding Upper chest: Bronchomalacia with severe of the bronchus intermedius at time of imaging. Streaky opacity in the bilateral upper lungs with air trapping. Review of the MIP images confirms the above findings CTA HEAD FINDINGS Anterior circulation: Atherosclerotic calcification on the carotid siphons. No branch occlusion or flow limiting stenosis. Negative for aneurysm. Posterior circulation: Left dominant vertebral artery. The vertebral and basilar arteries are smooth and widely patent. No branch occlusion or flow limiting stenosis. Negative for aneurysm Venous sinuses: Prominent enhancement within the distal transverse sinuses but no evident fistula or other shunt. Anatomic variants: None significant Delayed phase: Not obtained in the emergent setting Review of the MIP images confirms the above findings CT Brain Perfusion Findings: CBF (<30%) Volume: 61mL Perfusion (Tmax>6.0s) volume: 61mL IMPRESSION: 1. No emergent large vessel occlusion or infarct/ischemia by CT perfusion. 2. 60% narrowing of the right ICA bulb due to bulky atherosclerotic plaque. 3. Bronchomalacia and advanced right bronchus intermedius airway narrowing with biapical lung opacity at least partially from atelectasis. Electronically Signed   By: Monte Fantasia M.D.   On: 04/24/2018 09:17   Ct Cerebral Perfusion W Contrast  Result Date: 04/24/2018 CLINICAL DATA:  Left-sided weakness and ataxia for 2 days. Evaluate for posterior circulation stroke. EXAM: CT ANGIOGRAPHY HEAD AND NECK CT PERFUSION BRAIN TECHNIQUE: Multidetector CT imaging of the head and neck was performed using the standard protocol during bolus administration of intravenous contrast. Multiplanar CT image reconstructions and MIPs were obtained to evaluate the vascular anatomy. Carotid stenosis  measurements (when applicable) are obtained utilizing NASCET criteria, using the distal  internal carotid diameter as the denominator. Multiphase CT imaging of the brain was performed following IV bolus contrast injection. Subsequent parametric perfusion maps were calculated using RAPID software. CONTRAST:  48mL ISOVUE-370 IOPAMIDOL (ISOVUE-370) INJECTION 76% COMPARISON:  Head CT from earlier today FINDINGS: CTA NECK FINDINGS Aortic arch: Atherosclerotic plaque.  3 vessel branching. Right carotid system: Extensive atheromatous plaque at the common carotid bifurcation and ICA bulb. Bulb stenosis measures 60%. No definite ulceration or downstream beading. High-grade ECA origin stenosis. Left carotid system: Moderate calcified plaque at the bifurcation without flow limiting stenosis or ulceration. Vertebral arteries: No proximal subclavian stenosis. Mild left vertebral artery dominance. Both vertebral arteries are widely patent to the dura. Skeleton: Cervical spine degeneration. Remote C1 ring and dens fractures . No bony union at these fractures, but unchanged alignment. Other neck: No acute finding Upper chest: Bronchomalacia with severe of the bronchus intermedius at time of imaging. Streaky opacity in the bilateral upper lungs with air trapping. Review of the MIP images confirms the above findings CTA HEAD FINDINGS Anterior circulation: Atherosclerotic calcification on the carotid siphons. No branch occlusion or flow limiting stenosis. Negative for aneurysm. Posterior circulation: Left dominant vertebral artery. The vertebral and basilar arteries are smooth and widely patent. No branch occlusion or flow limiting stenosis. Negative for aneurysm Venous sinuses: Prominent enhancement within the distal transverse sinuses but no evident fistula or other shunt. Anatomic variants: None significant Delayed phase: Not obtained in the emergent setting Review of the MIP images confirms the above findings CT Brain Perfusion  Findings: CBF (<30%) Volume: 14mL Perfusion (Tmax>6.0s) volume: 30mL IMPRESSION: 1. No emergent large vessel occlusion or infarct/ischemia by CT perfusion. 2. 60% narrowing of the right ICA bulb due to bulky atherosclerotic plaque. 3. Bronchomalacia and advanced right bronchus intermedius airway narrowing with biapical lung opacity at least partially from atelectasis. Electronically Signed   By: Monte Fantasia M.D.   On: 04/24/2018 09:17   Vas US Carotid (at [place] Only)  Result Date: 04/24/2018 Carotid Arterial Duplex Study Indications: TIA. Performing Technologist: Toma Copier RVS  Examination Guidelines: A complete evaluation includes B-mode imaging, spectral Doppler, color Doppler, and power Doppler as needed of all accessible portions of each vessel. Bilateral testing is considered an integral part of a complete examination. Limited examinations for reoccurring indications may be performed as noted.  Right Carotid Findings: +----------+--------+--------+--------+--------------------+-------------------+           PSV cm/sEDV cm/sStenosisDescribe            Comments            +----------+--------+--------+--------+--------------------+-------------------+ CCA Prox  75      8                                                       +----------+--------+--------+--------+--------------------+-------------------+ CCA Distal71      11                                  minimal intimal  wall changes        +----------+--------+--------+--------+--------------------+-------------------+ ICA Prox  132     27      1-39%   heterogenous and                                                          irregular                               +----------+--------+--------+--------+--------------------+-------------------+ ICA Mid   144     32                                  intimal thickening   +----------+--------+--------+--------+--------------------+-------------------+ ICA Distal128     24                                                      +----------+--------+--------+--------+--------------------+-------------------+ ECA       173     4               heterogenous        At the origin with                                                        acoustic shadowing  +----------+--------+--------+--------+--------------------+-------------------+ +----------+--------+-------+--------+-------------------+           PSV cm/sEDV cmsDescribeArm Pressure (mmHG) +----------+--------+-------+--------+-------------------+ [id number]                                         +----------+--------+-------+--------+-------------------+ +---------+--------+--+--------+-+ VertebralPSV cm/s49EDV cm/s7 +---------+--------+--+--------+-+  Left Carotid Findings: +----------+--------+--------+--------+------------+-------------------------+           PSV cm/sEDV cm/sStenosisDescribe    Comments                  +----------+--------+--------+--------+------------+-------------------------+ CCA Prox  147     21                          mild intimal wall changes +----------+--------+--------+--------+------------+-------------------------+ CCA Distal102     14                          mild intimal changes      +----------+--------+--------+--------+------------+-------------------------+ ICA Prox  58      11              heterogenousmild plaque               +----------+--------+--------+--------+------------+-------------------------+ ICA Mid   106     16                                                    +----------+--------+--------+--------+------------+-------------------------+  ICA Distal73      19                                                    +----------+--------+--------+--------+------------+-------------------------+ ECA        154     14              heterogenousmild plaque               +----------+--------+--------+--------+------------+-------------------------+ +----------+--------+--------+--------+-------------------+ SubclavianPSV cm/sEDV cm/sDescribeArm Pressure (mmHG) +----------+--------+--------+--------+-------------------+           137                                         +----------+--------+--------+--------+-------------------+ +---------+--------+--+--------+--+ VertebralPSV cm/s68EDV cm/s17 +---------+--------+--+--------+--+  Summary: Right Carotid: Velocities in the right ICA are consistent with a 1-39% stenosis.                Velocity and plaque may be upper end of scale. Left Carotid: Velocities in the left ICA are consistent with a 1-39% stenosis. Vertebrals:  Bilateral vertebral arteries demonstrate antegrade flow. Subclavians: Normal flow hemodynamics were seen in bilateral subclavian              arteries. *See table(s) above for measurements and observations.  Electronically signed by Curt Jews MD on 04/24/2018 at 4:51:49 PM.    Final     Scheduled Meds: . amiodarone  200 mg Oral [id number]  . apixaban  2.5 mg Oral BID  . aspirin  324 mg Oral Once  . aspirin  300 mg Rectal Daily   Or  . aspirin  325 mg Oral Daily  . atorvastatin  80 mg Oral Daily  . gabapentin  300 mg Oral Daily  . gabapentin  600 mg Oral QHS  . levothyroxine  50 mcg Oral Q0600   Continuous Infusions:  Principal Problem:   TIA (transient ischemic attack) Active Problems:   Persistent atrial fibrillation   Hypertension   Diabetes ([place])   Pacemaker   CAD (coronary artery disease)   (HFpEF) heart failure with preserved ejection fraction ([place])    Time spent: 17 minutes    [place] NP [facility]  If 7PM-7AM, please contact night-coverage at [email or web address], password [facility] 04/25/2018, 3:13 PM  LOS: 0 days

## 2018-04-25 NOTE — Evaluation (Signed)
Speech Language Pathology Evaluation Patient Details Name: Teonna Coonan MRN: [id number] DOB: November 21, 1935 Today's Date: 04/25/2018 Time: 1529-1600 SLP Time Calculation (min) (ACUTE ONLY): 31 min  Problem List:  Patient Active Problem List   Diagnosis Date Noted  . TIA (transient ischemic attack) 04/24/2018  . CAD (coronary artery disease) 08/07/2016  . (HFpEF) heart failure with preserved ejection fraction ([place]) 08/07/2016  . Ischemic cardiomyopathy 08/07/2016  . Chest pain   . NSTEMI (non-ST elevated myocardial infarction) ([place])   . Status post coronary artery stent placement   . Abnormal stress test   . SOB (shortness of breath)   . Multifocal pneumonia 07/13/2016  . Elevated troponin 07/13/2016  . Leukocytosis   . Pacemaker 05/17/2014  . Complications, pacemaker cardiac, mechanical 12/31/2012  . Edema extremities 10/08/2012  . Persistent atrial fibrillation 10/08/2012  . Hypertension 10/08/2012  . Hypothyroidism 10/08/2012  . Neuropathy 10/08/2012  . Diabetes ([place]) 10/08/2012  . Depression 09/08/2012  . Anemia 08/08/2012  . Knee fracture, left 08/07/2012  . Recurrent falls 08/07/2012  . Syncope 08/07/2012  . SDH (subdural hematoma) ([place]) 08/07/2012   Past Medical History:  Past Medical History:  Diagnosis Date  . Anemia   . Anxiety   . Arthritis    "all over" (02/11/2018)  . Basal cell carcinoma    "left leg" (02/11/2018)  . Cervical spine fracture ([place]) 12/2017   "C1-2"  . CHF (congestive heart failure) ([place])   . Chronic bronchitis ([place])   . Chronic neck pain    "since I broke my neck 6-8 wk ago" (02/11/2018)  . Diabetic peripheral neuropathy ([place])   . Fibromyalgia   . Fracture of right humerus   . Generalized weakness   . Headache    "weekly" (02/11/2018)  . History of blood transfusion    "related to one of my femur surgeries" (02/11/2018)  . History of echocardiogram    Echo 8/18: EF 50-55, no RWMA, Gr 1 DD, calcified AV leaflets, MAC, trivial MR, mod  LAE, PASP 37  . Hypertension   . Hypothyroidism   . Incontinence of urine   . Ischemic stroke ([place]) 2000   "lost part of the vision in my right eye" (02/11/2018)  . Major depression, chronic   . Myocardial infarction ([place]) 06/2016  . Pacemaker   . Recurrent falls   . Syncope and collapse   . Type 2 diabetes, diet controlled ([place])    Past Surgical History:  Past Surgical History:  Procedure Laterality Date  . ABDOMINAL HYSTERECTOMY    . ANKLE FRACTURE SURGERY Right   . BASAL CELL CARCINOMA EXCISION Left    "leg"  . CARDIAC CATHETERIZATION  ~ 2014  . CARDIOVERSION N/A 02/13/2018   Procedure: CARDIOVERSION;  Surgeon: Sueanne Margarita, MD;  Location: [facility] ENDOSCOPY;  Service: Cardiovascular;  Laterality: N/A;  . CARDIOVERSION N/A 03/27/2018   Procedure: CARDIOVERSION;  Surgeon: Lelon Perla, MD;  Location: [facility] ENDOSCOPY;  Service: Cardiovascular;  Laterality: N/A;  . CARPAL TUNNEL RELEASE     bilateral  . CATARACT EXTRACTION W/ INTRAOCULAR LENS  IMPLANT, BILATERAL Bilateral   . CHOLECYSTECTOMY OPEN    . COLONOSCOPY    . CORONARY ANGIOPLASTY WITH STENT PLACEMENT  06/2016  . CORONARY STENT INTERVENTION N/A 07/19/2016   Procedure: Coronary Stent Intervention;  Surgeon: Peter M Martinique, MD;  Location: [place] CV LAB;  Service: Cardiovascular;  Laterality: N/A;  . EYE SURGERY     right eye catarace/lens implant  . FEMUR FRACTURE SURGERY Bilateral   .  FRACTURE SURGERY    . GASTRIC BYPASS    . INSERT / REPLACE / REMOVE PACEMAKER  ~ 2012  . LEAD REVISION N/A 01/01/2013   Procedure: LEAD REVISION;  Surgeon: Evans Lance, MD;  Location: [facility] CATH LAB;  Service: Cardiovascular;  Laterality: N/A;  . LEFT HEART CATH AND CORONARY ANGIOGRAPHY N/A 07/19/2016   Procedure: Left Heart Cath and Coronary Angiography;  Surgeon: Peter M Martinique, MD;  Location: [facility] CV LAB;  Service: Cardiovascular;  Laterality: N/A;  . OVARIAN CYST SURGERY     "one side only"  . TEE WITHOUT CARDIOVERSION N/A  02/13/2018   Procedure: TRANSESOPHAGEAL ECHOCARDIOGRAM (TEE);  Surgeon: Sueanne Margarita, MD;  Location: [facility] ENDOSCOPY;  Service: Cardiovascular;  Laterality: N/A;  . TONSILLECTOMY     HPI:  83 y.o. female with medical history significant of diet-controlled DM; CAD; CVA in 2000; hypothyroidism; HTN; pacemaker placement; and CHF presenting with weakness and ataxia.  She came to Urgent care (per pt) because she thought she was having a heart attack - her left arm was tingling and her chest felt like it was blowing up with air.  She told her son her left leg was also tingling.  Denied facial symptoms.  No dysphagia.  +dysarthria, slurred yesterday but moreso on 04/24/18.  +expressive aphasia.  Her son reports some difficulty swallowing her pills, but pt noted to pass [facility] swallow screen.  She acknowledges worsening gait instability since Monday.  Pt unable to participate with MRI d/t pacemaker placement; CT head on 04/24/2018 indicated No emergent large vessel occlusion or infarct/ischemia by CT Perfusion. 2. 60% narrowing of the right ICA bulb due to bulky atherosclerotic plaque. 3. Bronchomalacia and advanced right bronchus intermedius airway narrowing with biapical lung opacity at least partially from atelectasis.  Assessment / Plan / Recommendation Clinical Impression   Pt administered MOCA (Montreal Cognitive Assessment) with results of 24/30 noted (26/30 is a score within normal limits) with deficits noted in the areas of memory and visuospatial/executive function and reasoning complex information; pt was anxious when SLP arrived for SLE indicating she "needed to get in touch with Dr. Donnetta Hutching because she didn't give him all of the information her son needed her to" and after explaining the probable CVA d/t clinical signs noted could impact reasoning/cognitive issues and after Dr. Donnetta Hutching was contacted by SLP, she appeared to be less anxious.  Memory was affected with recall of new information and with  short-term memory tasks.  Speech was intelligible during complex conversational tasks, but would intermittently seem deliberate when communicating complex information.  Pt has diplopia which impacts completion of visual tasks, although she was able to read environmental signs without difficulty.  Pt aware of deficits re: gait and decreased strength; ST will f/u for cognitive deficits while in acute setting, but CIR is recommended for f/u with cognitive skills in conjunction with other disciplines.  Thank you for this consult.    SLP Assessment  SLP Recommendation/Assessment: Patient needs continued Speech Language Pathology Services SLP Visit Diagnosis: Cognitive communication deficit (R41.841)    Follow Up Recommendations  Inpatient Rehab    Frequency and Duration min 1 x/week  1 week      SLP Evaluation Cognition  Overall Cognitive Status: Impaired/Different from baseline Arousal/Alertness: Awake/alert Orientation Level: Oriented X4 Memory: Impaired Memory Impairment: Retrieval deficit;Decreased recall of new information;Decreased short term memory Decreased Short Term Memory: Verbal basic;Verbal complex Awareness: Appears intact Problem Solving: Appears intact Executive Function: Reasoning Reasoning: Impaired Reasoning Impairment: Verbal  complex;Functional complex Behaviors: (anxious) Safety/Judgment: Appears intact       Comprehension  Auditory Comprehension Overall Auditory Comprehension: Appears within functional limits for tasks assessed Yes/No Questions: Within Functional Limits Commands: Within Functional Limits Conversation: Complex Interfering Components: Anxiety;Working memory;Visual impairments;Other (comment)(diplopia) EffectiveTechniques: Repetition Visual Recognition/Discrimination Discrimination: Exceptions to [facility] Other Visual Recogniton/Discrimination Comments: (diplopia) Reading Comprehension Reading Status: Within funtional limits(with use of adapted  glasses)    Expression Expression Primary Mode of Expression: Verbal Verbal Expression Overall Verbal Expression: Impaired Initiation: No impairment Level of Generative/Spontaneous Verbalization: Conversation Repetition: No impairment Naming: No impairment Pragmatics: No impairment Interfering Components: Other (comment)(deliberate speech noted in conversation; min dysarthria) Non-Verbal Means of Communication: Not applicable Written Expression Dominant Hand: Right Written Expression: Exceptions to [facility] Interfering Components: Other (comment)(diplopia)   Oral / Motor  Oral Motor/Sensory Function Overall Oral Motor/Sensory Function: Within functional limits Motor Speech Overall Motor Speech: Appears within functional limits for tasks assessed Respiration: Within functional limits Phonation: Normal Resonance: Within functional limits Articulation: Within functional limitis Intelligibility: Intelligible Motor Planning: Witnin functional limits Motor Speech Errors: Not applicable Effective Techniques: Slow rate;Pause                       Elvina Sidle, M.S., CCC-SLP 04/25/2018, 4:38 PM

## 2018-04-25 NOTE — Evaluation (Addendum)
Occupational Therapy Evaluation Patient Details Name: Meredith Gonzalez MRN: [id number] DOB: June 02, 1935 Today's Date: 04/25/2018    History of Present Illness Meredith Gonzalez is a 83 y.o. female with medical history significant of diet-controlled DM; CAD; CVA in 2000; hypothyroidism; HTN; pacemaker placement; and CHF presenting with weakness and ataxia. CT negative for acute findings.   Clinical Impression   PTA, pt was living alone at an ILF and was independent with ADLs and light IADLs; used a RW as she felt she needed. Pt currently requiring Min A for ADLs and functional mobility with RW. Pt presenting with decreased balance, vision, coordination, strength, functional use of LUE, and cognition. Pt reporting vertical diplopia that goes away with one eye closed (pt also with baseline vision deficits at right eye). Provided visual occlusion and pt states diplopia goes away; will continue to assess. Pt highly motivated to participate in therapy and return to PLOF. Pt will require further acute OT to facilitate safe dc. Recommend dc to CIR for intensive OT to optimize safety, independence with ADLs, and return to PLOF.      Follow Up Recommendations  CIR;Supervision/Assistance - 24 hour    Equipment Recommendations  Other (comment)(Defer to next venue)    Recommendations for Other Services Rehab consult;PT consult     Precautions / Restrictions Precautions Precautions: Fall Precaution Comments: has fallen a lot with injuries over past 3 years recent in Sept broke her neck Restrictions Weight Bearing Restrictions: No      Mobility Bed Mobility Overal bed mobility: Needs Assistance Bed Mobility: Supine to Sit     Supine to sit: Min guard;HOB elevated     General bed mobility comments: Min Guard A for safety and HOB elevated. Pt use of bedrail to assist  Transfers Overall transfer level: Needs assistance Equipment used: Rolling walker (2 wheeled) Transfers: Sit to/from Stand Sit to  Stand: Min assist         General transfer comment: Min A for power up and to steady in standing. Pt with poor coorindation at BLEs and at risk for falls.     Balance Overall balance assessment: Needs assistance;History of Falls Sitting-balance support: Feet supported Sitting balance-Leahy Scale: Fair     Standing balance support: Bilateral upper extremity supported Standing balance-Leahy Scale: Poor Standing balance comment: heavy UE reliance and at least Min Guard-min A For static standing                           ADL either performed or assessed with clinical judgement   ADL Overall ADL's : Needs assistance/impaired Eating/Feeding: Set up;Supervision/ safety;Sitting   Grooming: Minimal assistance;Min guard;Brushing hair;Oral care;Standing Grooming Details (indicate cue type and reason): Pt requiring MIn Guard A -Min A for balance and safety at sink. Pt with decreased FM skills, grasp strength, depth perception, and vision. Pt overshooting when reaching out for faucet or grooming items Upper Body Bathing: Minimal assistance;Sitting   Lower Body Bathing: Minimal assistance;Sit to/from stand   Upper Body Dressing : Minimal assistance;Sitting   Lower Body Dressing: Minimal assistance;Sit to/from stand Lower Body Dressing Details (indicate cue type and reason): Pt able to bend forward to adjut socks Toilet Transfer: Minimal assistance;Ambulation;RW(simulated to recliner)           Functional mobility during ADLs: Minimal assistance;Moderate assistance;Rolling walker General ADL Comments: Pt with decreased balance, coordination, vision, and cognition impacting her functional performance and safety     Vision Baseline Vision/History: Wears glasses Wears Glasses:  Reading only Patient Visual Report: No change from baseline Vision Assessment?: Yes Eye Alignment: Impaired (comment)(Slight dysalignment during tracking) Ocular Range of Motion: Within Functional  Limits Alignment/Gaze Preference: Within Defined Limits Tracking/Visual Pursuits: Able to track stimulus in all quads without difficulty;Impaired - to be further tested in functional context(Decreased smooth tracking) Convergence: Impaired - to be further tested in functional context(Left eye not converging with right) Visual Fields: No apparent deficits;Other (comment)(Right eye has a central visual field cut from prior TIA) Diplopia Assessment: Objects split on top of one another;Disappears with one eye closed Depth Perception: Undershoots Additional Comments: Pt reporting baseline central vision loss at right eye; has been living with this for several years. Pt with diplopia and reports it goes away with closing of either eye. Covering nasal portion of right eye and pt reports diplopia goes away. Will continue to assess.     Perception     Praxis      Pertinent Vitals/Pain Pain Assessment: Faces Faces Pain Scale: Hurts little more Pain Location: neck Pain Descriptors / Indicators: Aching;Sharp Pain Intervention(s): Monitored during session;Limited activity within patient's tolerance;Repositioned     Hand Dominance Right   Extremity/Trunk Assessment Upper Extremity Assessment Upper Extremity Assessment: LUE deficits/detail LUE Deficits / Details: AROM WFL, strength shoulder flexion 3+/5, elbow flexion 3+/5. Poor finger opposition, grasp strength, and depth perception.  LUE Sensation: decreased light touch LUE Coordination: decreased fine motor   Lower Extremity Assessment Lower Extremity Assessment: Defer to PT evaluation LLE Deficits / Details: AROM WFL, strength hip flexion 3/5, knee extension 4/5, ankle DF 4-/5 LLE Sensation: decreased light touch LLE Coordination: decreased gross motor       Communication Communication Communication: Expressive difficulties(dyarthric)   Cognition Arousal/Alertness: Awake/alert Behavior During Therapy: WFL for tasks  assessed/performed Overall Cognitive Status: Impaired/Different from baseline Area of Impairment: Safety/judgement;Attention                   Current Attention Level: Sustained     Safety/Judgement: Decreased awareness of deficits;Decreased awareness of safety     General Comments: Pt requiring increased time throughout and present with some difficulty processing and recalling words. Pt motivated to participate in therapy.    General Comments       Exercises     Shoulder Instructions      Home Living Family/patient expects to be discharged to:: Private residence Living Arrangements: Alone Available Help at Discharge: Family Type of Home: House Home Access: Level entry     [place]: One level     Bathroom Shower/Tub: [place]: Environmental consultant - 4 wheels;Walker - 2 wheels;Bedside commode;Shower seat - built in;Grab bars - tub/shower   Additional Comments: [facility]"      Prior Functioning/Environment Level of Independence: Independent with assistive device(s)        Comments: ADLs and light IADLs. driving. Son assists with heavier IADL including vaccuuming. Pt uses RW as she feels she needs.        OT Problem List: Decreased strength;Decreased range of motion;Decreased activity tolerance;Impaired balance (sitting and/or standing);Impaired vision/perception;Decreased coordination;Decreased cognition;Decreased safety awareness;Decreased knowledge of use of DME or AE;Decreased knowledge of precautions;Impaired UE functional use;Pain      OT Treatment/Interventions: Self-care/ADL training;Therapeutic exercise;Energy conservation;DME and/or AE instruction;Therapeutic activities;Patient/family education    OT Goals(Current goals can be found in the care plan section) Acute Rehab OT Goals Patient Stated Goal: to return to independent OT Goal Formulation: With patient  Time For Goal Achievement:  05/09/18 Potential to Achieve Goals: Good  OT Frequency: Min 3X/week   Barriers to D/C:            Co-evaluation              AM-PAC OT "6 Clicks" Daily Activity     Outcome Measure Help from another person eating meals?: None Help from another person taking care of personal grooming?: A Little Help from another person toileting, which includes using toliet, bedpan, or urinal?: A Little Help from another person bathing (including washing, rinsing, drying)?: A Little Help from another person to put on and taking off regular upper body clothing?: A Little Help from another person to put on and taking off regular lower body clothing?: A Little 6 Click Score: 19   End of Session Equipment Utilized During Treatment: Gait belt;Rolling walker Nurse Communication: Mobility status  Activity Tolerance: Patient tolerated treatment well Patient left: in chair;with call bell/phone within reach;with chair alarm set  OT Visit Diagnosis: Unsteadiness on feet (R26.81);Other abnormalities of gait and mobility (R26.89);Muscle weakness (generalized) (M62.81);History of falling (Z91.81);Pain Pain - part of body: (Neck)                Time: 1117-3567 OT Time Calculation (min): 32 min Charges:  OT General Charges $OT Visit: 1 Visit OT Evaluation $OT Eval Moderate Complexity: 1 Mod OT Treatments $Self Care/Home Management : 8-22 mins  Corrado Hymon MSOT, OTR/L Acute Rehab Pager: [phone] Office: [place] 04/25/2018, 11:32 AM

## 2018-04-25 NOTE — Progress Notes (Signed)
Rehab Admissions Coordinator Note:  Per OT recommendation, this patient was screened by Jhonnie Garner for appropriateness for an Inpatient Acute Rehab Consult. Noted pt is observation status at this time. Pt may not have the medical necessity to warrant an inpatient rehab stay if they remain observation.  If status were to change to inpatient, [facility] will screen for candidacy.   Jhonnie Garner 04/25/2018, 2:30 PM  I can be reached at [phone]

## 2018-04-25 NOTE — Consult Note (Signed)
[facility] [place]  Patient name: Meredith Gonzalez MRN: [id number] DOB: Nov 03, 1935 Sex: female  REASON FOR CONSULT: Evaluation right carotid stenosis  HPI: Meredith Gonzalez is a 83 y.o. female, who is admitted with staggering gait and left arm and leg weakness.  Work-up included CTA which revealed moderate to severe 60 to 70% right carotid bifurcation stenosis.  She has a pacemaker and therefore cannot have an MRI.  CT showed no evidence of bleed.  Patient has a multiple prior recent medical difficulties to include atrial fibrillation and congestive heart failure.  Fell with a cervical spine fracture as well.  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  Past Medical History:  Diagnosis Date  . Anemia   . Anxiety   . Arthritis    "all over" (02/11/2018)  . Basal cell carcinoma    "left leg" (02/11/2018)  . Cervical spine fracture ([place]) 12/2017   "C1-2"  . CHF (congestive heart failure) ([place])   . Chronic bronchitis ([place])   . Chronic neck pain    "since I broke my neck 6-8 wk ago" (02/11/2018)  . Diabetic peripheral neuropathy ([place])   . Fibromyalgia   . Fracture of right humerus   . Generalized weakness   . Headache    "weekly" (02/11/2018)  . History of blood transfusion    "related to one of my femur surgeries" (02/11/2018)  . History of echocardiogram    Echo 8/18: EF 50-55, no RWMA, Gr 1 DD, calcified AV leaflets, MAC, trivial MR, mod LAE, PASP 37  . Hypertension   . Hypothyroidism   . Incontinence of urine   . Ischemic stroke ([place]) 2000   "lost part of the vision in my right eye" (02/11/2018)  . Major depression, chronic   . Myocardial infarction ([place]) 06/2016  . Pacemaker   . Recurrent falls   . Syncope and collapse   . Type 2 diabetes, diet controlled ([place])     Family History  Problem Relation Age of Onset  . Congestive Heart  Failure Mother   . Heart attack Father   . Alcoholism Father   . Alcoholism Sister   . Alcoholism Brother   . Alcoholism Brother   . Throat cancer Brother     SOCIAL HISTORY: Social History   Socioeconomic History  . Marital status: Widowed    Spouse name: Not on file  . Number of children: Not on file  . Years of education: Not on file  . Highest education level: Not on file  Occupational History  . Not on file  Social Needs  . Financial resource strain: Not on file  . Food insecurity:    Worry: Not on file    Inability: Not on file  . Transportation needs:    Medical: Not on file    Non-medical: Not on file  Tobacco Use  . Smoking status: Former Smoker    Packs/day: 0.50    Years: 4.00    Pack years: 2.00    Types: Cigarettes    Last attempt to quit: 08/07/1972    Years since quitting: 45.7  . Smokeless tobacco: Never Used  Substance and Sexual Activity  . Alcohol use: Yes    Frequency: Never    Comment: 02/11/2018 "couple drinks/year; if that"  . Drug use: Never  . Sexual activity: Not on file  Lifestyle  . Physical activity:    Days per  week: Not on file    Minutes per session: Not on file  . Stress: Not on file  Relationships  . Social connections:    Talks on phone: Not on file    Gets together: Not on file    Attends religious service: Not on file    Active member of club or organization: Not on file    Attends meetings of clubs or organizations: Not on file    Relationship status: Not on file  . Intimate partner violence:    Fear of current or ex partner: Not on file    Emotionally abused: Not on file    Physically abused: Not on file    Forced sexual activity: Not on file  Other Topics Concern  . Not on file  Social History Narrative  . Not on file    Allergies  Allergen Reactions  . Valium [Diazepam] Anxiety    Makes patient hyper Makes patient hyper  . Codeine Hives and Itching    Can take with Benadryl Can take with Benadryl  .  Darifenacin Itching    Can take with Benadryl  . Darvon [Propoxyphene Hcl] Itching    Can take with Benadryl  . Daypro [Oxaprozin] Itching    Can take with Benadryl Can take with Benadryl  . Enablex [Darifenacin Hydrobromide Er] Itching    Can take with Benadryl  . Oxycodone Itching    Can take with Benadryl Can take with Benadryl Can take with Benadryl Can take with Benadryl  . Propoxyphene Itching    Can take with Benadryl  . Risperdal [Risperidone] Itching    Can take with Benadryl Can take with Benadryl  . Talwin [Pentazocine] Itching    Can take with Benadryl Can take with Benadryl  . Vicodin [Hydrocodone-Acetaminophen] Itching    Can take with Benadryl Can take with Benadryl    Current Facility-Administered Medications  Medication Dose Route Frequency Provider Last Rate Last Dose  . acetaminophen (TYLENOL) tablet 650 mg  650 mg Oral Q4H PRN Karmen Bongo, MD   650 mg at 04/25/18 1059   Or  . acetaminophen (TYLENOL) solution 650 mg  650 mg Per Tube Q4H PRN Karmen Bongo, MD       Or  . acetaminophen (TYLENOL) suppository 650 mg  650 mg Rectal Q4H PRN Karmen Bongo, MD      . amiodarone (PACERONE) tablet 200 mg  200 mg Oral Ledell Noss, MD   200 mg at 04/25/18 5364  . apixaban (ELIQUIS) tablet 2.5 mg  2.5 mg Oral BID Karmen Bongo, MD      . aspirin chewable tablet 324 mg  324 mg Oral Once Karmen Bongo, MD      . aspirin suppository 300 mg  300 mg Rectal Daily Karmen Bongo, MD       Or  . aspirin tablet 325 mg  325 mg Oral Daily Karmen Bongo, MD   325 mg at 04/25/18 0905  . atorvastatin (LIPITOR) tablet 80 mg  80 mg Oral Daily Karmen Bongo, MD   80 mg at 04/25/18 0905  . gabapentin (NEURONTIN) capsule 300 mg  300 mg Oral Daily Karmen Bongo, MD   300 mg at 04/25/18 0905  . gabapentin (NEURONTIN) capsule 600 mg  600 mg Oral QHS Karmen Bongo, MD   600 mg at 04/24/18 2147  . levothyroxine (SYNTHROID, LEVOTHROID) tablet 50 mcg  50 mcg  Oral Q0600 Radene Gunning, NP   50 mcg at 04/25/18 1059  . senna-docusate (  Senokot-S) tablet 1 tablet  1 tablet Oral QHS PRN Karmen Bongo, MD      . simethicone [facility]) chewable tablet 80 mg  80 mg Oral Q6H PRN Geradine Girt, DO        REVIEW OF SYSTEMS:  Reviewed in her history and physical with nothing to add  PHYSICAL EXAM: Vitals:   04/25/18 0010 04/25/18 0415 04/25/18 0859 04/25/18 1101  BP: 128/62 (!) 120/53 (!) 111/53 123/66  Pulse: 74 69 72 68  Resp:  18 16 16   Temp: 98 F (36.7 C) 98 F (36.7 C) 98 F (36.7 C) 98.2 F (36.8 C)  TempSrc: Oral Oral Oral Oral  SpO2: 100% 98% 98% 99%  Weight:      Height:        GENERAL: The patient is a well-nourished female, in no acute distress. The vital signs are documented above. CARDIOVASCULAR: Normal carotid pulsation and normal radial pulses bilaterally PULMONARY: There is good air exchange  ABDOMEN: Soft and non-tender  MUSCULOSKELETAL: There are no major deformities or cyanosis. NEUROLOGIC: Diminished grip strength on the left SKIN: There are no ulcers or rashes noted. PSYCHIATRIC: The patient has a normal affect.  DATA:  CT angiogram reveals moderate to severe right carotid bifurcation stenosis.  She has extremely irregular plaque with both calcified and very soft elements in this.  The common and internal carotid arteries are normal above and below  MEDICAL ISSUES: Had long discussion with the patient and also long discussion with the patient's son, Lennette Bihari, by telephone.  Explained there is no way to be certain that the carotid stenosis caused her neurologic deficit.  Explained that this would certainly be her most likely cause of this.  Reviewed notes from neurology who feel that this likely was the source as well.  We will follow along with you.  Discussed procedure of right carotid endarterectomy and the potential risk of this to include slight risk of stroke with surgery.  Explained that we would defer this until some  recovery from her most recent event.  Will follow along with you   Rosetta Posner, MD [facility] Tel [phone] Pager [phone]

## 2018-04-26 DIAGNOSIS — G8929 Other chronic pain: Secondary | ICD-10-CM | POA: Diagnosis not present

## 2018-04-26 DIAGNOSIS — I482 Chronic atrial fibrillation, unspecified: Secondary | ICD-10-CM | POA: Diagnosis not present

## 2018-04-26 DIAGNOSIS — E8809 Other disorders of plasma-protein metabolism, not elsewhere classified: Secondary | ICD-10-CM | POA: Diagnosis present

## 2018-04-26 DIAGNOSIS — I10 Essential (primary) hypertension: Secondary | ICD-10-CM | POA: Diagnosis not present

## 2018-04-26 DIAGNOSIS — Z950 Presence of cardiac pacemaker: Secondary | ICD-10-CM | POA: Diagnosis not present

## 2018-04-26 DIAGNOSIS — Z66 Do not resuscitate: Secondary | ICD-10-CM | POA: Diagnosis present

## 2018-04-26 DIAGNOSIS — M797 Fibromyalgia: Secondary | ICD-10-CM | POA: Diagnosis present

## 2018-04-26 DIAGNOSIS — F5101 Primary insomnia: Secondary | ICD-10-CM | POA: Diagnosis not present

## 2018-04-26 DIAGNOSIS — R0989 Other specified symptoms and signs involving the circulatory and respiratory systems: Secondary | ICD-10-CM | POA: Diagnosis not present

## 2018-04-26 DIAGNOSIS — G4700 Insomnia, unspecified: Secondary | ICD-10-CM | POA: Diagnosis present

## 2018-04-26 DIAGNOSIS — I5032 Chronic diastolic (congestive) heart failure: Secondary | ICD-10-CM | POA: Diagnosis present

## 2018-04-26 DIAGNOSIS — H5461 Unqualified visual loss, right eye, normal vision left eye: Secondary | ICD-10-CM | POA: Diagnosis present

## 2018-04-26 DIAGNOSIS — Z90710 Acquired absence of both cervix and uterus: Secondary | ICD-10-CM | POA: Diagnosis not present

## 2018-04-26 DIAGNOSIS — D638 Anemia in other chronic diseases classified elsewhere: Secondary | ICD-10-CM | POA: Diagnosis present

## 2018-04-26 DIAGNOSIS — E1142 Type 2 diabetes mellitus with diabetic polyneuropathy: Secondary | ICD-10-CM | POA: Diagnosis present

## 2018-04-26 DIAGNOSIS — G4709 Other insomnia: Secondary | ICD-10-CM | POA: Diagnosis not present

## 2018-04-26 DIAGNOSIS — Z79899 Other long term (current) drug therapy: Secondary | ICD-10-CM | POA: Diagnosis not present

## 2018-04-26 DIAGNOSIS — Z7951 Long term (current) use of inhaled steroids: Secondary | ICD-10-CM | POA: Diagnosis not present

## 2018-04-26 DIAGNOSIS — R402252 Coma scale, best verbal response, oriented, at arrival to emergency department: Secondary | ICD-10-CM | POA: Diagnosis present

## 2018-04-26 DIAGNOSIS — I110 Hypertensive heart disease with heart failure: Secondary | ICD-10-CM | POA: Diagnosis present

## 2018-04-26 DIAGNOSIS — E039 Hypothyroidism, unspecified: Secondary | ICD-10-CM | POA: Diagnosis present

## 2018-04-26 DIAGNOSIS — I69354 Hemiplegia and hemiparesis following cerebral infarction affecting left non-dominant side: Secondary | ICD-10-CM | POA: Diagnosis not present

## 2018-04-26 DIAGNOSIS — I6521 Occlusion and stenosis of right carotid artery: Secondary | ICD-10-CM | POA: Diagnosis not present

## 2018-04-26 DIAGNOSIS — M542 Cervicalgia: Secondary | ICD-10-CM | POA: Diagnosis not present

## 2018-04-26 DIAGNOSIS — I452 Bifascicular block: Secondary | ICD-10-CM | POA: Diagnosis present

## 2018-04-26 DIAGNOSIS — Z7901 Long term (current) use of anticoagulants: Secondary | ICD-10-CM | POA: Diagnosis not present

## 2018-04-26 DIAGNOSIS — I252 Old myocardial infarction: Secondary | ICD-10-CM | POA: Diagnosis not present

## 2018-04-26 DIAGNOSIS — E46 Unspecified protein-calorie malnutrition: Secondary | ICD-10-CM | POA: Diagnosis not present

## 2018-04-26 DIAGNOSIS — I639 Cerebral infarction, unspecified: Secondary | ICD-10-CM | POA: Diagnosis not present

## 2018-04-26 DIAGNOSIS — R29707 NIHSS score 7: Secondary | ICD-10-CM | POA: Diagnosis present

## 2018-04-26 DIAGNOSIS — I255 Ischemic cardiomyopathy: Secondary | ICD-10-CM | POA: Diagnosis present

## 2018-04-26 DIAGNOSIS — I2510 Atherosclerotic heart disease of native coronary artery without angina pectoris: Secondary | ICD-10-CM | POA: Diagnosis present

## 2018-04-26 DIAGNOSIS — R471 Dysarthria and anarthria: Secondary | ICD-10-CM | POA: Diagnosis present

## 2018-04-26 DIAGNOSIS — G8194 Hemiplegia, unspecified affecting left nondominant side: Secondary | ICD-10-CM | POA: Diagnosis present

## 2018-04-26 DIAGNOSIS — G458 Other transient cerebral ischemic attacks and related syndromes: Secondary | ICD-10-CM | POA: Diagnosis present

## 2018-04-26 DIAGNOSIS — K5900 Constipation, unspecified: Secondary | ICD-10-CM | POA: Diagnosis present

## 2018-04-26 DIAGNOSIS — I69398 Other sequelae of cerebral infarction: Secondary | ICD-10-CM | POA: Diagnosis not present

## 2018-04-26 DIAGNOSIS — R402362 Coma scale, best motor response, obeys commands, at arrival to emergency department: Secondary | ICD-10-CM | POA: Diagnosis present

## 2018-04-26 DIAGNOSIS — E785 Hyperlipidemia, unspecified: Secondary | ICD-10-CM | POA: Diagnosis present

## 2018-04-26 DIAGNOSIS — R29810 Facial weakness: Secondary | ICD-10-CM | POA: Diagnosis present

## 2018-04-26 DIAGNOSIS — R202 Paresthesia of skin: Secondary | ICD-10-CM | POA: Diagnosis present

## 2018-04-26 DIAGNOSIS — I4819 Other persistent atrial fibrillation: Secondary | ICD-10-CM | POA: Diagnosis present

## 2018-04-26 DIAGNOSIS — Z87891 Personal history of nicotine dependence: Secondary | ICD-10-CM | POA: Diagnosis not present

## 2018-04-26 DIAGNOSIS — I4891 Unspecified atrial fibrillation: Secondary | ICD-10-CM | POA: Diagnosis present

## 2018-04-26 DIAGNOSIS — Z85828 Personal history of other malignant neoplasm of skin: Secondary | ICD-10-CM | POA: Diagnosis not present

## 2018-04-26 DIAGNOSIS — R402142 Coma scale, eyes open, spontaneous, at arrival to emergency department: Secondary | ICD-10-CM | POA: Diagnosis present

## 2018-04-26 DIAGNOSIS — R270 Ataxia, unspecified: Secondary | ICD-10-CM | POA: Diagnosis present

## 2018-04-26 DIAGNOSIS — R4701 Aphasia: Secondary | ICD-10-CM | POA: Diagnosis present

## 2018-04-26 LAB — BASIC METABOLIC PANEL
Anion gap: 10 (ref 5–15)
BUN: 21 mg/dL (ref 8–23)
CHLORIDE: 108 mmol/L (ref 98–111)
CO2: 22 mmol/L (ref 22–32)
Calcium: 7.9 mg/dL — ABNORMAL LOW (ref 8.9–10.3)
Creatinine, Ser: 0.96 mg/dL (ref 0.44–1.00)
GFR calc Af Amer: >60 mL/min (ref >60)
GFR calc non Af Amer: 56 mL/min — ABNORMAL LOW (ref >60)
Glucose, Bld: 151 mg/dL — ABNORMAL HIGH (ref 70–99)
Potassium: 3.7 mmol/L (ref 3.5–5.1)
Sodium: 140 mmol/L (ref 135–145)

## 2018-04-26 LAB — VITAMIN B12: Vitamin B-12: 264 pg/mL (ref 180–914)

## 2018-04-26 LAB — CBC
HEMATOCRIT: 33.9 % — AB (ref 36.0–46.0)
Hemoglobin: 9.7 g/dL — ABNORMAL LOW (ref 12.0–15.0)
MCH: 27.8 pg (ref 26.0–34.0)
MCHC: 28.6 g/dL — ABNORMAL LOW (ref 30.0–36.0)
MCV: 97.1 fL (ref 80.0–100.0)
Platelets: 185 10*3/uL (ref 150–400)
RBC: 3.49 MIL/uL — ABNORMAL LOW (ref 3.87–5.11)
RDW: 16.2 % — ABNORMAL HIGH (ref 11.5–15.5)
WBC: 6.5 10*3/uL (ref 4.0–10.5)
nRBC: 0 % (ref 0.0–0.2)

## 2018-04-26 MED ORDER — APIXABAN 2.5 MG PO TABS
2.5000 mg | ORAL_TABLET | Freq: Two times a day (BID) | ORAL | Status: DC
  Administered 2018-04-26 – 2018-04-27 (×3): 2.5 mg via ORAL
  Filled 2018-04-26 (×2): qty 1

## 2018-04-26 MED ORDER — APIXABAN 2.5 MG PO TABS: 2.5000 mg | ORAL_TABLET | Freq: Two times a day (BID) | ORAL | Status: DC

## 2018-04-26 NOTE — Progress Notes (Signed)
Occupational Therapy Treatment Patient Details Name: Meredith Gonzalez MRN: [id number] DOB: 05/26/1935 Today's Date: 04/26/2018    History of present illness Meredith Gonzalez is a 83 y.o. female with medical history significant of diet-controlled DM; CAD; CVA in 2000; hypothyroidism; HTN; pacemaker placement; and CHF presenting with weakness and ataxia. CT negative for acute findings.   OT comments  Pt making good progress towards established OT goals and is very motivated to participate in therapy and return to PLOF. Pt continues to report diplopia but states it hs reduced and she was able to read the news paper today (which she couldn't do yesterday); reduced occlusion and educated pt and son on occlusion process. Pt requiring Min Guard-Min A for ADLs and functional mobility with continued presentation of decreased balance, strength, and coordination. Continues to recommend dc to CIR and will continue to follow acutely as admitted.    Follow Up Recommendations  CIR;Supervision/Assistance - 24 hour    Equipment Recommendations  Other (comment)(Defer to next venue)    Recommendations for Other Services Rehab consult;PT consult    Precautions / Restrictions Precautions Precautions: Fall Precaution Comments: has fallen a lot with injuries over past 3 years recent in Sept broke her neck Restrictions Weight Bearing Restrictions: No       Mobility Bed Mobility Overal bed mobility: Needs Assistance Bed Mobility: Supine to Sit     Supine to sit: Min guard;HOB elevated Sit to supine: Min guard   General bed mobility comments: Min Guard A for safety  Transfers Overall transfer level: Needs assistance Equipment used: Rolling walker (2 wheeled) Transfers: Sit to/from Stand Sit to Stand: Min guard;Min assist         General transfer comment: Min A for steadying in standing    Balance Overall balance assessment: Needs assistance;History of Falls Sitting-balance support: Feet  supported Sitting balance-Leahy Scale: Fair     Standing balance support: Bilateral upper extremity supported Standing balance-Leahy Scale: Poor Standing balance comment: UE support                           ADL either performed or assessed with clinical judgement   ADL Overall ADL's : Needs assistance/impaired                 Upper Body Dressing : Minimal assistance;Sitting Upper Body Dressing Details (indicate cue type and reason): doning house coat with Min A. Doffed with Min Guard A while seated at [employer] Transfer: Minimal assistance;Ambulation;RW(simulated in room)           Functional mobility during ADLs: Minimal assistance;Min guard;Rolling walker General ADL Comments: Pt demonstrating progress. Continues to present with decreased balance, strength, and vision.      Vision   Vision Assessment?: Yes Eye Alignment: Impaired (comment)(Slight dysalignment during tracking) Ocular Range of Motion: Within Functional Limits Alignment/Gaze Preference: Within Defined Limits Tracking/Visual Pursuits: Able to track stimulus in all quads without difficulty;Impaired - to be further tested in functional context(Decreased smooth tracking) Convergence: Impaired - to be further tested in functional context(Left eye not converging with right) Visual Fields: No apparent deficits;Other (comment)(Right eye has a central visual field cut from prior TIA) Diplopia Assessment: Objects split on top of one another;Disappears with one eye closed Depth Perception: Undershoots Additional Comments: Pt reporting that with right gaze she has diplopia. Pt has been wearing occlusion glasses. Reduced occlusion closer to nasal postion of right eye.    Perception  Praxis      Cognition Arousal/Alertness: Awake/alert Behavior During Therapy: WFL for tasks assessed/performed Overall Cognitive Status: Impaired/Different from baseline Area of Impairment:  Safety/judgement;Attention                   Current Attention Level: Sustained     Safety/Judgement: Decreased awareness of deficits;Decreased awareness of safety     General Comments: Pt requiring increased time throughout and present with some difficulty processing and recalling words. Pt motivated to participate in therapy.         Exercises     Shoulder Instructions       General Comments Son in room upon eval. discussed dc options pending pt progress    Pertinent Vitals/ Pain       Pain Assessment: Faces Faces Pain Scale: Hurts little more Pain Location: neck Pain Descriptors / Indicators: Aching;Sharp Pain Intervention(s): Monitored during session;Limited activity within patient's tolerance;Repositioned  Home Living                                          Prior Functioning/Environment              Frequency  Min 3X/week        Progress Toward Goals  OT Goals(current goals can now be found in the care plan section)  Progress towards OT goals: Progressing toward goals  Acute Rehab OT Goals Patient Stated Goal: to return to independent OT Goal Formulation: With patient Time For Goal Achievement: 05/09/18 Potential to Achieve Goals: Good ADL Goals Pt Will Perform Grooming: with modified independence;standing Pt Will Perform Lower Body Dressing: with modified independence;sit to/from stand Pt Will Transfer to Toilet: with modified independence;bedside commode;ambulating Pt Will Perform Toileting - Clothing Manipulation and hygiene: with modified independence;sit to/from stand Additional ADL Goal #1: Pt will verbalize understanding of visual occlusion to reduce diplopia during ADLs with Min cues Additional ADL Goal #2: Pt will demonstrate selective attention during ADLs in distracting environment with 1-2 cues  Plan Discharge plan remains appropriate    Co-evaluation                 AM-PAC OT "6 Clicks" Daily Activity      Outcome Measure   Help from another person eating meals?: None Help from another person taking care of personal grooming?: A Little Help from another person toileting, which includes using toliet, bedpan, or urinal?: A Little Help from another person bathing (including washing, rinsing, drying)?: A Little Help from another person to put on and taking off regular upper body clothing?: A Little Help from another person to put on and taking off regular lower body clothing?: A Little 6 Click Score: 19    End of Session Equipment Utilized During Treatment: Gait belt;Rolling walker  OT Visit Diagnosis: Unsteadiness on feet (R26.81);Other abnormalities of gait and mobility (R26.89);Muscle weakness (generalized) (M62.81);History of falling (Z91.81);Pain;Hemiplegia and hemiparesis Hemiplegia - Right/Left: Left Hemiplegia - dominant/non-dominant: Non-Dominant Hemiplegia - caused by: Unspecified Pain - part of body: (Neck)   Activity Tolerance Patient tolerated treatment well   Patient Left with call bell/phone within reach;in bed;with bed alarm set   Nurse Communication Mobility status        Time: 1243-1300 OT Time Calculation (min): 17 min  Charges: OT General Charges $OT Visit: 1 Visit OT Treatments $Self Care/Home Management : 8-22 mins  Konni Kesinger MSOT, OTR/L Acute Rehab Pager:  [phone] Office: [place] 04/26/2018, 4:09 PM

## 2018-04-26 NOTE — Progress Notes (Signed)
Subjective: Interval History: none.. Sitting up in her chair.  Reports feeling much better today.  Reports tingling has resolved in her left arm and walking better with left leg weakness.  Speech is also better  Objective: Vital signs in last 24 hours: Temp:  [98 F (36.7 C)-98.6 F (37 C)] 98.5 F (36.9 C) (02/02 0800) Pulse Rate:  [67-80] 72 (02/02 0800) Resp:  [16-21] 18 (02/02 0800) BP: (102-130)/(48-74) 129/64 (02/02 0800) SpO2:  [98 %-100 %] 99 % (02/02 0800)  Intake/Output from previous day: No intake/output data recorded. Intake/Output this shift: No intake/output data recorded.  No change in physical exam  Lab Results: Recent Labs    04/24/18 0545 04/26/18 0529  WBC 7.2 6.5  HGB 11.4* 9.7*  HCT 39.1 33.9*  PLT 232 185   BMET Recent Labs    04/24/18 0545 04/26/18 0529  NA 137 140  K 3.6 3.7  CL 103 108  CO2 28 22  GLUCOSE 116* 151*  BUN 34* 21  CREATININE 1.11* 0.96  CALCIUM 8.7* 7.9*    Studies/Results: Ct Angio Head W Or Wo Contrast  Result Date: 04/24/2018 CLINICAL DATA:  Left-sided weakness and ataxia for 2 days. Evaluate for posterior circulation stroke. EXAM: CT ANGIOGRAPHY HEAD AND NECK CT PERFUSION BRAIN TECHNIQUE: Multidetector CT imaging of the head and neck was performed using the standard protocol during bolus administration of intravenous contrast. Multiplanar CT image reconstructions and MIPs were obtained to evaluate the vascular anatomy. Carotid stenosis measurements (when applicable) are obtained utilizing NASCET criteria, using the distal internal carotid diameter as the denominator. Multiphase CT imaging of the brain was performed following IV bolus contrast injection. Subsequent parametric perfusion maps were calculated using RAPID software. CONTRAST:  43mL ISOVUE-370 IOPAMIDOL (ISOVUE-370) INJECTION 76% COMPARISON:  Head CT from earlier today FINDINGS: CTA NECK FINDINGS Aortic arch: Atherosclerotic plaque.  3 vessel branching. Right  carotid system: Extensive atheromatous plaque at the common carotid bifurcation and ICA bulb. Bulb stenosis measures 60%. No definite ulceration or downstream beading. High-grade ECA origin stenosis. Left carotid system: Moderate calcified plaque at the bifurcation without flow limiting stenosis or ulceration. Vertebral arteries: No proximal subclavian stenosis. Mild left vertebral artery dominance. Both vertebral arteries are widely patent to the dura. Skeleton: Cervical spine degeneration. Remote C1 ring and dens fractures . No bony union at these fractures, but unchanged alignment. Other neck: No acute finding Upper chest: Bronchomalacia with severe of the bronchus intermedius at time of imaging. Streaky opacity in the bilateral upper lungs with air trapping. Review of the MIP images confirms the above findings CTA HEAD FINDINGS Anterior circulation: Atherosclerotic calcification on the carotid siphons. No branch occlusion or flow limiting stenosis. Negative for aneurysm. Posterior circulation: Left dominant vertebral artery. The vertebral and basilar arteries are smooth and widely patent. No branch occlusion or flow limiting stenosis. Negative for aneurysm Venous sinuses: Prominent enhancement within the distal transverse sinuses but no evident fistula or other shunt. Anatomic variants: None significant Delayed phase: Not obtained in the emergent setting Review of the MIP images confirms the above findings CT Brain Perfusion Findings: CBF (<30%) Volume: 14mL Perfusion (Tmax>6.0s) volume: 20mL IMPRESSION: 1. No emergent large vessel occlusion or infarct/ischemia by CT perfusion. 2. 60% narrowing of the right ICA bulb due to bulky atherosclerotic plaque. 3. Bronchomalacia and advanced right bronchus intermedius airway narrowing with biapical lung opacity at least partially from atelectasis. Electronically Signed   By: Monte Fantasia M.D.   On: 04/24/2018 09:17   Ct Head Wo  Contrast  Result Date:  04/25/2018 CLINICAL DATA:  Ataxia, stroke suspected. EXAM: CT HEAD WITHOUT CONTRAST TECHNIQUE: Contiguous axial images were obtained from the base of the skull through the vertex without intravenous contrast. COMPARISON:  Prior imaging 04/24/2018 was negative for large vessel occlusion, or infarct/ischemia. FINDINGS: Brain: No evidence for acute infarction, hemorrhage, mass lesion, hydrocephalus, or extra-axial fluid. Generalized atrophy. Chronic microvascular ischemic change. Incidental RIGHT parietal sulcal calcification. Vascular: Calcification of the cavernous internal carotid arteries consistent with cerebrovascular atherosclerotic disease. No signs of intracranial large vessel occlusion. Skull: Calvarium intact. Sinuses/Orbits: No acute findings. Other: None. IMPRESSION: Atrophy and small vessel disease. No acute intracranial findings. Stable appearance compared with yesterday's imaging. Electronically Signed   By: Staci Righter M.D.   On: 04/25/2018 11:05   Ct Head Wo Contrast  Result Date: 04/24/2018 CLINICAL DATA:  Focal neuro deficit.  Headache. EXAM: CT HEAD WITHOUT CONTRAST TECHNIQUE: Contiguous axial images were obtained from the base of the skull through the vertex without intravenous contrast. COMPARISON:  12/28/2017 FINDINGS: Brain: No evidence of acute infarction, hemorrhage, hydrocephalus, extra-axial collection or mass lesion/mass effect. Stable small sulcal calcification along the right parietal convexity, incidental in isolation. Moderate chronic small vessel ischemia in the cerebral white matter. Mild for age volume loss. Vascular: Atherosclerotic calcification Skull: No acute or aggressive finding. Prominent right atlantooccipital degenerative spurring. Prior C1 ring fracture that is not covered on this study. Sinuses/Orbits: Negative IMPRESSION: No acute finding or change from 2019. Electronically Signed   By: Monte Fantasia M.D.   On: 04/24/2018 06:23   Ct Angio Neck W And/or Wo  Contrast  Result Date: 04/24/2018 CLINICAL DATA:  Left-sided weakness and ataxia for 2 days. Evaluate for posterior circulation stroke. EXAM: CT ANGIOGRAPHY HEAD AND NECK CT PERFUSION BRAIN TECHNIQUE: Multidetector CT imaging of the head and neck was performed using the standard protocol during bolus administration of intravenous contrast. Multiplanar CT image reconstructions and MIPs were obtained to evaluate the vascular anatomy. Carotid stenosis measurements (when applicable) are obtained utilizing NASCET criteria, using the distal internal carotid diameter as the denominator. Multiphase CT imaging of the brain was performed following IV bolus contrast injection. Subsequent parametric perfusion maps were calculated using RAPID software. CONTRAST:  12mL ISOVUE-370 IOPAMIDOL (ISOVUE-370) INJECTION 76% COMPARISON:  Head CT from earlier today FINDINGS: CTA NECK FINDINGS Aortic arch: Atherosclerotic plaque.  3 vessel branching. Right carotid system: Extensive atheromatous plaque at the common carotid bifurcation and ICA bulb. Bulb stenosis measures 60%. No definite ulceration or downstream beading. High-grade ECA origin stenosis. Left carotid system: Moderate calcified plaque at the bifurcation without flow limiting stenosis or ulceration. Vertebral arteries: No proximal subclavian stenosis. Mild left vertebral artery dominance. Both vertebral arteries are widely patent to the dura. Skeleton: Cervical spine degeneration. Remote C1 ring and dens fractures . No bony union at these fractures, but unchanged alignment. Other neck: No acute finding Upper chest: Bronchomalacia with severe of the bronchus intermedius at time of imaging. Streaky opacity in the bilateral upper lungs with air trapping. Review of the MIP images confirms the above findings CTA HEAD FINDINGS Anterior circulation: Atherosclerotic calcification on the carotid siphons. No branch occlusion or flow limiting stenosis. Negative for aneurysm. Posterior  circulation: Left dominant vertebral artery. The vertebral and basilar arteries are smooth and widely patent. No branch occlusion or flow limiting stenosis. Negative for aneurysm Venous sinuses: Prominent enhancement within the distal transverse sinuses but no evident fistula or other shunt. Anatomic variants: None significant Delayed phase: Not obtained in  the emergent setting Review of the MIP images confirms the above findings CT Brain Perfusion Findings: CBF (<30%) Volume: 26mL Perfusion (Tmax>6.0s) volume: 31mL IMPRESSION: 1. No emergent large vessel occlusion or infarct/ischemia by CT perfusion. 2. 60% narrowing of the right ICA bulb due to bulky atherosclerotic plaque. 3. Bronchomalacia and advanced right bronchus intermedius airway narrowing with biapical lung opacity at least partially from atelectasis. Electronically Signed   By: Monte Fantasia M.D.   On: 04/24/2018 09:17   Ct Cerebral Perfusion W Contrast  Result Date: 04/24/2018 CLINICAL DATA:  Left-sided weakness and ataxia for 2 days. Evaluate for posterior circulation stroke. EXAM: CT ANGIOGRAPHY HEAD AND NECK CT PERFUSION BRAIN TECHNIQUE: Multidetector CT imaging of the head and neck was performed using the standard protocol during bolus administration of intravenous contrast. Multiplanar CT image reconstructions and MIPs were obtained to evaluate the vascular anatomy. Carotid stenosis measurements (when applicable) are obtained utilizing NASCET criteria, using the distal internal carotid diameter as the denominator. Multiphase CT imaging of the brain was performed following IV bolus contrast injection. Subsequent parametric perfusion maps were calculated using RAPID software. CONTRAST:  34mL ISOVUE-370 IOPAMIDOL (ISOVUE-370) INJECTION 76% COMPARISON:  Head CT from earlier today FINDINGS: CTA NECK FINDINGS Aortic arch: Atherosclerotic plaque.  3 vessel branching. Right carotid system: Extensive atheromatous plaque at the common carotid bifurcation  and ICA bulb. Bulb stenosis measures 60%. No definite ulceration or downstream beading. High-grade ECA origin stenosis. Left carotid system: Moderate calcified plaque at the bifurcation without flow limiting stenosis or ulceration. Vertebral arteries: No proximal subclavian stenosis. Mild left vertebral artery dominance. Both vertebral arteries are widely patent to the dura. Skeleton: Cervical spine degeneration. Remote C1 ring and dens fractures . No bony union at these fractures, but unchanged alignment. Other neck: No acute finding Upper chest: Bronchomalacia with severe of the bronchus intermedius at time of imaging. Streaky opacity in the bilateral upper lungs with air trapping. Review of the MIP images confirms the above findings CTA HEAD FINDINGS Anterior circulation: Atherosclerotic calcification on the carotid siphons. No branch occlusion or flow limiting stenosis. Negative for aneurysm. Posterior circulation: Left dominant vertebral artery. The vertebral and basilar arteries are smooth and widely patent. No branch occlusion or flow limiting stenosis. Negative for aneurysm Venous sinuses: Prominent enhancement within the distal transverse sinuses but no evident fistula or other shunt. Anatomic variants: None significant Delayed phase: Not obtained in the emergent setting Review of the MIP images confirms the above findings CT Brain Perfusion Findings: CBF (<30%) Volume: 48mL Perfusion (Tmax>6.0s) volume: 9mL IMPRESSION: 1. No emergent large vessel occlusion or infarct/ischemia by CT perfusion. 2. 60% narrowing of the right ICA bulb due to bulky atherosclerotic plaque. 3. Bronchomalacia and advanced right bronchus intermedius airway narrowing with biapical lung opacity at least partially from atelectasis. Electronically Signed   By: Monte Fantasia M.D.   On: 04/24/2018 09:17   Vas US Carotid (at [place] Only)  Result Date: 04/24/2018 Carotid Arterial Duplex Study Indications: TIA. Performing  Technologist: Toma Copier RVS  Examination Guidelines: A complete evaluation includes B-mode imaging, spectral Doppler, color Doppler, and power Doppler as needed of all accessible portions of each vessel. Bilateral testing is considered an integral part of a complete examination. Limited examinations for reoccurring indications may be performed as noted.  Right Carotid Findings: +----------+--------+--------+--------+--------------------+-------------------+           PSV cm/sEDV cm/sStenosisDescribe            Comments            +----------+--------+--------+--------+--------------------+-------------------+  CCA Prox  75      8                                                       +----------+--------+--------+--------+--------------------+-------------------+ CCA Distal71      11                                  minimal intimal                                                           wall changes        +----------+--------+--------+--------+--------------------+-------------------+ ICA Prox  132     27      1-39%   heterogenous and                                                          irregular                               +----------+--------+--------+--------+--------------------+-------------------+ ICA Mid   144     32                                  intimal thickening  +----------+--------+--------+--------+--------------------+-------------------+ ICA Distal128     24                                                      +----------+--------+--------+--------+--------------------+-------------------+ ECA       173     4               heterogenous        At the origin with                                                        acoustic shadowing  +----------+--------+--------+--------+--------------------+-------------------+ +----------+--------+-------+--------+-------------------+           PSV cm/sEDV cmsDescribeArm  Pressure (mmHG) +----------+--------+-------+--------+-------------------+ [id number]                                         +----------+--------+-------+--------+-------------------+ +---------+--------+--+--------+-+ VertebralPSV cm/s49EDV cm/s7 +---------+--------+--+--------+-+  Left Carotid Findings: +----------+--------+--------+--------+------------+-------------------------+           PSV cm/sEDV cm/sStenosisDescribe    Comments                  +----------+--------+--------+--------+------------+-------------------------+ CCA Prox  147  21                          mild intimal wall changes +----------+--------+--------+--------+------------+-------------------------+ CCA Distal102     14                          mild intimal changes      +----------+--------+--------+--------+------------+-------------------------+ ICA Prox  58      11              heterogenousmild plaque               +----------+--------+--------+--------+------------+-------------------------+ ICA Mid   106     16                                                    +----------+--------+--------+--------+------------+-------------------------+ ICA Distal73      19                                                    +----------+--------+--------+--------+------------+-------------------------+ ECA       154     14              heterogenousmild plaque               +----------+--------+--------+--------+------------+-------------------------+ +----------+--------+--------+--------+-------------------+ SubclavianPSV cm/sEDV cm/sDescribeArm Pressure (mmHG) +----------+--------+--------+--------+-------------------+           137                                         +----------+--------+--------+--------+-------------------+ +---------+--------+--+--------+--+ VertebralPSV cm/s68EDV cm/s17 +---------+--------+--+--------+--+  Summary: Right Carotid:  Velocities in the right ICA are consistent with a 1-39% stenosis.                Velocity and plaque may be upper end of scale. Left Carotid: Velocities in the left ICA are consistent with a 1-39% stenosis. Vertebrals:  Bilateral vertebral arteries demonstrate antegrade flow. Subclavians: Normal flow hemodynamics were seen in bilateral subclavian              arteries. *See table(s) above for measurements and observations.  Electronically signed by Curt Jews MD on 04/24/2018 at 4:51:49 PM.    Final    Anti-infectives: Anti-infectives (From admission, onward)   None      Assessment/Plan: s/p * No surgery found * Dust again her moderate to severe right carotid irregular stenosis.  I had discussed this with the son by telephone and he is present in her room this morning.  Have recommended right carotid endarterectomy at some point as she recovers from her right brain event.  Would allow continued stroke recovery prior to this.  Only with you.   LOS: 0 days   Eleri Ruben 04/26/2018, 11:30 AM

## 2018-04-26 NOTE — Plan of Care (Signed)
Patient stable, discussed POC with patient and son, agreeable with plan, denies additional question/concerns at this time.

## 2018-04-26 NOTE — Progress Notes (Addendum)
[facility] PROGRESS NOTE  Meredith Gonzalez XNA:[id number] DOB: 10-27-1935 DOA: 04/24/2018 PCP: Sinclair Ship, MD  Assessment/Plan: CVA.  -At [facility] concern for posterior circulation stroke and she was transferred to [facility]. tPA not given as outside window.  CTA revealed stenosis and plaque of the right internal carotid artery and this suspected to be cause of symptoms. CIR recommended. Evaluated by vascular surgery who opine procedure/CEA can wait til recovery from this event complete -ASA daily - resumed eliquis as vascular procedure deferred to later time (oer pharmacy 2.5 mg dose) -atrovastatin 80mg  - appreciate Neurology consult -appreciatet vascular assistance  HTN. Controlled. Home meds include lasix -holding lasix for now -monitor closely -resume home med when indicated  HLD -ContinueLipitorbut increase from40 to 80mg    Hypothyroid -synthroid started-- will need close monitoring as was just recently converted out of atrial fibrillation as she does not tolerate this rhythm well  DM. Fair control - A1c  6.3 -Diet-controlled at home -monitor  Pacemaker -Not MRI-compatible -ekc with atrial paced rbbb  Afib -On Amiodarone for rate control -resumed Eliquis -monitor closely  CAD/CHF. No complaints of chest discomfort this am. Troponin slightly elevated but flat. No events on tele -continue home meds as noted aboe -monitor closely  Code Status: dnr Family Communication: none present Disposition Plan: to be determined.    Consultants:  Early vascular surgery  [facility]ora neuro  Procedures:  echo  Antibiotics:    HPI/Subjective: Reports feeling better this am. Feels strength in leg improving   Admitted 04/24/18 for stroke work up after tingling left arm/leg. Unable to confirm with MRI due to pacer. Symptoms improving slowly     Objective: Vitals:   04/26/18 0800 04/26/18 1156  BP: 129/64 (!) 126/59  Pulse: 72 67  Resp: 18 18  Temp:  98.5 F (36.9 C) 99 F (37.2 C)  SpO2: 99% 100%   No intake or output data in the 24 hours ending 04/26/18 1159 Filed Weights   04/24/18 1558  Weight: 59.4 kg    Exam:   General:  Awake alert in no acute distress  Cardiovascular: rrr no MGR no LE edema  Respiratory: normal effort BS clear bilaterally no rhonchi no wheeze  Abdomen: non-distended, non-tender, soft +BS no guarding or rebounding.   Musculoskeletal: joints without swelling/erythema  Neuro: alert and oriented x3. Speech clear facial symmetry, tongue midline. MOE bilateral grip 5/5   Data Reviewed: Basic Metabolic Panel: Recent Labs  Lab 04/24/18 0545 04/26/18 0529  NA 137 140  K 3.6 3.7  CL 103 108  CO2 28 22  GLUCOSE 116* 151*  BUN 34* 21  CREATININE 1.11* 0.96  CALCIUM 8.7* 7.9*   Liver Function Tests: Recent Labs  Lab 04/24/18 0545  AST 24  ALT 14  ALKPHOS 94  BILITOT 0.4  PROT 7.1  ALBUMIN 3.4*   No results for input(s): LIPASE, AMYLASE in the last 168 hours. No results for input(s): AMMONIA in the last 168 hours. CBC: Recent Labs  Lab 04/24/18 0545 04/26/18 0529  WBC 7.2 6.5  NEUTROABS 5.4  --   HGB 11.4* 9.7*  HCT 39.1 33.9*  MCV 98.2 97.1  PLT 232 185   Cardiac Enzymes: Recent Labs  Lab 04/24/18 0545 04/24/18 1213 04/24/18 1837 04/25/18 0018  TROPONINI <0.03 <0.03 <0.03 <0.03   BNP (last 3 results) Recent Labs    02/10/18 1519  BNP 550.4*    ProBNP (last 3 results) No results for input(s): PROBNP in the last 8760 hours.  CBG: No  results for input(s): GLUCAP in the last 168 hours.  No results found for this or any previous visit (from the past 240 hour(s)).   Studies: Ct Head Wo Contrast  Result Date: 04/25/2018 CLINICAL DATA:  Ataxia, stroke suspected. EXAM: CT HEAD WITHOUT CONTRAST TECHNIQUE: Contiguous axial images were obtained from the base of the skull through the vertex without intravenous contrast. COMPARISON:  Prior imaging 04/24/2018 was negative  for large vessel occlusion, or infarct/ischemia. FINDINGS: Brain: No evidence for acute infarction, hemorrhage, mass lesion, hydrocephalus, or extra-axial fluid. Generalized atrophy. Chronic microvascular ischemic change. Incidental RIGHT parietal sulcal calcification. Vascular: Calcification of the cavernous internal carotid arteries consistent with cerebrovascular atherosclerotic disease. No signs of intracranial large vessel occlusion. Skull: Calvarium intact. Sinuses/Orbits: No acute findings. Other: None. IMPRESSION: Atrophy and small vessel disease. No acute intracranial findings. Stable appearance compared with yesterday's imaging. Electronically Signed   By: Staci Righter M.D.   On: 04/25/2018 11:05   Vas US Carotid (at [place] Only)  Result Date: 04/24/2018 Carotid Arterial Duplex Study Indications: TIA. Performing Technologist: Toma Copier RVS  Examination Guidelines: A complete evaluation includes B-mode imaging, spectral Doppler, color Doppler, and power Doppler as needed of all accessible portions of each vessel. Bilateral testing is considered an integral part of a complete examination. Limited examinations for reoccurring indications may be performed as noted.  Right Carotid Findings: +----------+--------+--------+--------+--------------------+-------------------+           PSV cm/sEDV cm/sStenosisDescribe            Comments            +----------+--------+--------+--------+--------------------+-------------------+ CCA Prox  75      8                                                       +----------+--------+--------+--------+--------------------+-------------------+ CCA Distal71      11                                  minimal intimal                                                           wall changes        +----------+--------+--------+--------+--------------------+-------------------+ ICA Prox  132     27      1-39%   heterogenous and                                                           irregular                               +----------+--------+--------+--------+--------------------+-------------------+ ICA Mid   144     32  intimal thickening  +----------+--------+--------+--------+--------------------+-------------------+ ICA Distal128     24                                                      +----------+--------+--------+--------+--------------------+-------------------+ ECA       173     4               heterogenous        At the origin with                                                        acoustic shadowing  +----------+--------+--------+--------+--------------------+-------------------+ +----------+--------+-------+--------+-------------------+           PSV cm/sEDV cmsDescribeArm Pressure (mmHG) +----------+--------+-------+--------+-------------------+ [id number]                                         +----------+--------+-------+--------+-------------------+ +---------+--------+--+--------+-+ VertebralPSV cm/s49EDV cm/s7 +---------+--------+--+--------+-+  Left Carotid Findings: +----------+--------+--------+--------+------------+-------------------------+           PSV cm/sEDV cm/sStenosisDescribe    Comments                  +----------+--------+--------+--------+------------+-------------------------+ CCA Prox  147     21                          mild intimal wall changes +----------+--------+--------+--------+------------+-------------------------+ CCA Distal102     14                          mild intimal changes      +----------+--------+--------+--------+------------+-------------------------+ ICA Prox  58      11              heterogenousmild plaque               +----------+--------+--------+--------+------------+-------------------------+ ICA Mid   106     16                                                     +----------+--------+--------+--------+------------+-------------------------+ ICA Distal73      19                                                    +----------+--------+--------+--------+------------+-------------------------+ ECA       154     14              heterogenousmild plaque               +----------+--------+--------+--------+------------+-------------------------+ +----------+--------+--------+--------+-------------------+ SubclavianPSV cm/sEDV cm/sDescribeArm Pressure (mmHG) +----------+--------+--------+--------+-------------------+           137                                         +----------+--------+--------+--------+-------------------+ +---------+--------+--+--------+--+  VertebralPSV cm/s68EDV cm/s17 +---------+--------+--+--------+--+  Summary: Right Carotid: Velocities in the right ICA are consistent with a 1-39% stenosis.                Velocity and plaque may be upper end of scale. Left Carotid: Velocities in the left ICA are consistent with a 1-39% stenosis. Vertebrals:  Bilateral vertebral arteries demonstrate antegrade flow. Subclavians: Normal flow hemodynamics were seen in bilateral subclavian              arteries. *See table(s) above for measurements and observations.  Electronically signed by Curt Jews MD on 04/24/2018 at 4:51:49 PM.    Final     Scheduled Meds: . amiodarone  200 mg Oral BH-q7a  . apixaban  2.5 mg Oral BID  . aspirin  324 mg Oral Once  . aspirin  300 mg Rectal Daily   Or  . aspirin  325 mg Oral Daily  . atorvastatin  80 mg Oral Daily  . gabapentin  300 mg Oral Daily  . gabapentin  600 mg Oral QHS  . levothyroxine  50 mcg Oral Q0600   Continuous Infusions:  Principal Problem:   TIA (transient ischemic attack) Active Problems:   Persistent atrial fibrillation   Hypertension   Hypothyroidism   Diabetes ([place])   Pacemaker   CAD (coronary artery disease)   (HFpEF) heart failure with preserved ejection  fraction ([place])    Time spent: 20 minutes    [place] NP [facility]  If 7PM-7AM, please contact night-coverage at [email or web address], password [facility] 04/26/2018, 11:59 AM  LOS: 0 days     Patient was seen, examined,treatment plan was discussed with the Advance Practice Provider.  I have personally reviewed the clinical findings, labs, EKG, imaging studies and management of this patient in detail. I have also reviewed the orders written for this patient which were under my direction. I agree with the documentation, as recorded by the Advance Practice Provider.   Meredith Gonzalez is a 83 y.o. female here with a CVA.  MRI is unobtainable due to pacemaker not being compatible.  CVA due to stenosis of the right internal carotid artery.  Have been unable to get pacemaker interrogated as device is [employer].  Symptoms of left sided weakness are slowly improving-- BP was on the lower side yesterday and now improved-- will allow for permissive HTN.  Another finding is new diagnosis of hypothyroidism.  Synthroid has been started at a lower dose due to risk of atrial fibrillation.  She had just been cardioverted late last year as she does not tolerate the rhythm.   CIR evaluation as she lives in an [place].  PT re-eval in AM.  Exam shows some improvement in weakness in legs but patient is not back to her baseline.  Meredith Girt, DO  How to contact the [facility] Attending or Consulting provider [place] or covering provider during after hours [place], for this patient?  1. Check the care team in [facility] and look for a) attending/consulting TR[facility] provider listed and b) the [facility] team listed 2. Log into [email or web address] and use [place]'s universal password to access. If you do not have the password, please contact the hospital operator. 3. Locate the [facility] provider you are looking for under [facility] and page to a number that you can be directly reached. 4. If you still have difficulty reaching the provider,  please page the [facility] (Director on Call) for the Hospitalists listed on amion for assistance.

## 2018-04-27 ENCOUNTER — Encounter (HOSPITAL_COMMUNITY): Payer: Self-pay

## 2018-04-27 ENCOUNTER — Inpatient Hospital Stay (HOSPITAL_COMMUNITY)
Admission: RE | Admit: 2018-04-27 | Discharge: 2018-05-07 | DRG: 057 | Disposition: A | Discharge status: Home Health | Payer: Medicare Other | Source: Intra-hospital | Attending: Physical Medicine & Rehabilitation | Admitting: Physical Medicine & Rehabilitation

## 2018-04-27 ENCOUNTER — Other Ambulatory Visit: Payer: Self-pay

## 2018-04-27 DIAGNOSIS — I252 Old myocardial infarction: Secondary | ICD-10-CM | POA: Diagnosis not present

## 2018-04-27 DIAGNOSIS — Z7901 Long term (current) use of anticoagulants: Secondary | ICD-10-CM | POA: Diagnosis not present

## 2018-04-27 DIAGNOSIS — E46 Unspecified protein-calorie malnutrition: Secondary | ICD-10-CM | POA: Diagnosis not present

## 2018-04-27 DIAGNOSIS — Z85828 Personal history of other malignant neoplasm of skin: Secondary | ICD-10-CM | POA: Diagnosis not present

## 2018-04-27 DIAGNOSIS — E039 Hypothyroidism, unspecified: Secondary | ICD-10-CM | POA: Diagnosis present

## 2018-04-27 DIAGNOSIS — I2510 Atherosclerotic heart disease of native coronary artery without angina pectoris: Secondary | ICD-10-CM | POA: Diagnosis present

## 2018-04-27 DIAGNOSIS — I639 Cerebral infarction, unspecified: Secondary | ICD-10-CM | POA: Diagnosis present

## 2018-04-27 DIAGNOSIS — Z7951 Long term (current) use of inhaled steroids: Secondary | ICD-10-CM | POA: Diagnosis not present

## 2018-04-27 DIAGNOSIS — I110 Hypertensive heart disease with heart failure: Secondary | ICD-10-CM | POA: Diagnosis present

## 2018-04-27 DIAGNOSIS — K5900 Constipation, unspecified: Secondary | ICD-10-CM | POA: Diagnosis present

## 2018-04-27 DIAGNOSIS — E1142 Type 2 diabetes mellitus with diabetic polyneuropathy: Secondary | ICD-10-CM | POA: Diagnosis present

## 2018-04-27 DIAGNOSIS — R0989 Other specified symptoms and signs involving the circulatory and respiratory systems: Secondary | ICD-10-CM | POA: Diagnosis not present

## 2018-04-27 DIAGNOSIS — Z87891 Personal history of nicotine dependence: Secondary | ICD-10-CM | POA: Diagnosis not present

## 2018-04-27 DIAGNOSIS — I4819 Other persistent atrial fibrillation: Secondary | ICD-10-CM

## 2018-04-27 DIAGNOSIS — E8809 Other disorders of plasma-protein metabolism, not elsewhere classified: Secondary | ICD-10-CM | POA: Diagnosis present

## 2018-04-27 DIAGNOSIS — E785 Hyperlipidemia, unspecified: Secondary | ICD-10-CM | POA: Diagnosis present

## 2018-04-27 DIAGNOSIS — I4891 Unspecified atrial fibrillation: Secondary | ICD-10-CM | POA: Diagnosis present

## 2018-04-27 DIAGNOSIS — M542 Cervicalgia: Secondary | ICD-10-CM

## 2018-04-27 DIAGNOSIS — Z79899 Other long term (current) drug therapy: Secondary | ICD-10-CM

## 2018-04-27 DIAGNOSIS — D638 Anemia in other chronic diseases classified elsewhere: Secondary | ICD-10-CM | POA: Diagnosis present

## 2018-04-27 DIAGNOSIS — I69354 Hemiplegia and hemiparesis following cerebral infarction affecting left non-dominant side: Principal | ICD-10-CM

## 2018-04-27 DIAGNOSIS — I5032 Chronic diastolic (congestive) heart failure: Secondary | ICD-10-CM | POA: Diagnosis present

## 2018-04-27 DIAGNOSIS — Z950 Presence of cardiac pacemaker: Secondary | ICD-10-CM

## 2018-04-27 DIAGNOSIS — G4700 Insomnia, unspecified: Secondary | ICD-10-CM | POA: Diagnosis present

## 2018-04-27 DIAGNOSIS — I10 Essential (primary) hypertension: Secondary | ICD-10-CM

## 2018-04-27 DIAGNOSIS — I482 Chronic atrial fibrillation, unspecified: Secondary | ICD-10-CM

## 2018-04-27 DIAGNOSIS — M797 Fibromyalgia: Secondary | ICD-10-CM | POA: Diagnosis present

## 2018-04-27 DIAGNOSIS — G4709 Other insomnia: Secondary | ICD-10-CM | POA: Diagnosis not present

## 2018-04-27 DIAGNOSIS — Z90710 Acquired absence of both cervix and uterus: Secondary | ICD-10-CM | POA: Diagnosis not present

## 2018-04-27 DIAGNOSIS — F5101 Primary insomnia: Secondary | ICD-10-CM | POA: Diagnosis not present

## 2018-04-27 DIAGNOSIS — G8929 Other chronic pain: Secondary | ICD-10-CM

## 2018-04-27 MED ORDER — SORBITOL 70 % SOLN: 30.0000 mL | Freq: Every day | Status: DC | PRN

## 2018-04-27 MED ORDER — SIMETHICONE 80 MG PO CHEW
80.0000 mg | CHEWABLE_TABLET | Freq: Four times a day (QID) | ORAL | Status: DC | PRN
  Administered 2018-04-28 – 2018-04-30 (×5): 80 mg via ORAL
  Filled 2018-04-27 (×5): qty 1

## 2018-04-27 MED ORDER — LEVOTHYROXINE SODIUM 50 MCG PO TABS: 50.0000 ug | ORAL_TABLET | Freq: Every day | ORAL | 0 refills | Status: DC

## 2018-04-27 MED ORDER — GABAPENTIN 300 MG PO CAPS
600.0000 mg | ORAL_CAPSULE | Freq: Every day | ORAL | Status: DC
  Administered 2018-04-27 – 2018-05-06 (×10): 600 mg via ORAL
  Filled 2018-04-27 (×10): qty 2

## 2018-04-27 MED ORDER — ACETAMINOPHEN 325 MG PO TABS
650.0000 mg | ORAL_TABLET | ORAL | Status: DC | PRN
  Administered 2018-04-27 – 2018-05-06 (×23): 650 mg via ORAL
  Filled 2018-04-27 (×24): qty 2

## 2018-04-27 MED ORDER — ATORVASTATIN CALCIUM 40 MG PO TABS: 40.0000 mg | ORAL_TABLET | Freq: Every day | ORAL | Status: DC

## 2018-04-27 MED ORDER — SENNOSIDES-DOCUSATE SODIUM 8.6-50 MG PO TABS: 1.0000 | ORAL_TABLET | Freq: Every evening | ORAL | Status: DC | PRN

## 2018-04-27 MED ORDER — ATORVASTATIN CALCIUM 40 MG PO TABS
40.0000 mg | ORAL_TABLET | Freq: Every day | ORAL | Status: DC
  Administered 2018-04-27 – 2018-05-06 (×10): 40 mg via ORAL
  Filled 2018-04-27 (×10): qty 1

## 2018-04-27 MED ORDER — APIXABAN 2.5 MG PO TABS
2.5000 mg | ORAL_TABLET | Freq: Two times a day (BID) | ORAL | Status: DC
  Administered 2018-04-27 – 2018-05-07 (×20): 2.5 mg via ORAL
  Filled 2018-04-27 (×21): qty 1

## 2018-04-27 MED ORDER — ACETAMINOPHEN 650 MG RE SUPP: 650.0000 mg | RECTAL | Status: DC | PRN

## 2018-04-27 MED ORDER — GABAPENTIN 300 MG PO CAPS
300.0000 mg | ORAL_CAPSULE | Freq: Every day | ORAL | Status: DC
  Administered 2018-04-28 – 2018-05-07 (×10): 300 mg via ORAL
  Filled 2018-04-27 (×10): qty 1

## 2018-04-27 MED ORDER — AMIODARONE HCL 200 MG PO TABS
200.0000 mg | ORAL_TABLET | Freq: Every day | ORAL | Status: DC
  Administered 2018-04-28 – 2018-05-07 (×10): 200 mg via ORAL
  Filled 2018-04-27 (×10): qty 1

## 2018-04-27 MED ORDER — LEVOTHYROXINE SODIUM 50 MCG PO TABS
50.0000 ug | ORAL_TABLET | Freq: Every day | ORAL | Status: DC
  Administered 2018-04-28 – 2018-05-07 (×10): 50 ug via ORAL
  Filled 2018-04-27 (×10): qty 1

## 2018-04-27 MED ORDER — ACETAMINOPHEN 160 MG/5ML PO SOLN: 650.0000 mg | ORAL | Status: DC | PRN

## 2018-04-27 NOTE — Progress Notes (Addendum)
STROKE TEAM PROGRESS NOTE   INTERVAL HISTORY No family is at the bedside.  She is up in the gerichair at the bedside. She says her walking has improved but still with tingling. CIR recommended. No need for carotid intervention from stroke standpoint. This was discussed with pt and her son via telephone by Dr. Leonie Man.   Vitals:   04/26/18 2326 04/26/18 2328 04/27/18 0326 04/27/18 0758  BP: (!) 121/49 (!) 121/49 134/60 (!) 158/69  Pulse: 68 68 66 70  Resp: 17 18 18 18   Temp: 97.9 F (36.6 C) 97.9 F (36.6 C) 98 F (36.7 C) 98 F (36.7 C)  TempSrc: Oral Oral Oral Oral  SpO2: 97% 97% 95% 96%  Weight:      Height:        CBC:  Recent Labs  Lab 04/24/18 0545 04/26/18 0529  WBC 7.2 6.5  NEUTROABS 5.4  --   HGB 11.4* 9.7*  HCT 39.1 33.9*  MCV 98.2 97.1  PLT 232 283    Basic Metabolic Panel:  Recent Labs  Lab 04/24/18 0545 04/26/18 0529  NA 137 140  K 3.6 3.7  CL 103 108  CO2 28 22  GLUCOSE 116* 151*  BUN 34* 21  CREATININE 1.11* 0.96  CALCIUM 8.7* 7.9*   Lipid Panel:     Component Value Date/Time   CHOL 122 04/25/2018 0018   CHOL 95 (L) 03/20/2017 0728   TRIG 53 04/25/2018 0018   HDL 59 04/25/2018 0018   HDL 58 03/20/2017 0728   CHOLHDL 2.1 04/25/2018 0018   VLDL 11 04/25/2018 0018   LDLCALC 52 04/25/2018 0018   LDLCALC 28 03/20/2017 0728   HgbA1c:  Lab Results  Component Value Date   HGBA1C 6.3 (H) 04/25/2018   Urine Drug Screen: No results found for: LABOPIA, COCAINSCRNUR, LABBENZ, AMPHETMU, THCU, LABBARB  Alcohol Level     Component Value Date/Time   ETH <10 04/24/2018 0545   2D Echocardiogram   1. The left ventricle has normal systolic function of 66-29%. The cavity size is normal. There is no left ventricular wall thickness. Echo evidence of Indeterminate diastolic filling patterns.  2. Normal left atrial size.  3. Normal right atrial size.  4. Normal tricuspid valve.  5. Tricuspid regurgitation is mild.  6. The aortic valve tricuspid. There  is mild thickening of the aortic valve.  7. No atrial level shunt detected by color flow Doppler.   IMAGING No results found.  PHYSICAL EXAM Frail petite elderly Caucasian lady not in distress. . Afebrile. Head is nontraumatic. Neck is supple without bruit.    Cardiac exam no murmur or gallop. Lungs are clear to auscultation. Distal pulses are well felt.  Neurological Exam ;  Awake  Alert oriented x 3. Normal speech and language.eye movements full without nystagmus.fundi were not visualized. Vision acuity and fields appear normal. Hearing is diminished slightly bilaterally.l. Palatal movements are normal. Face symmetric. Tongue midline. Normal strength, tone, reflexes and coordination. Normal sensation. Gait deferred.   ASSESSMENT/PLAN Ms. Janai Maudlin is a 83 y.o. female with history of diet controlled DB, CAD, CVA, hypothyroidism, HTN, pacemaker, CHF presenting with L arm and leg tingling with dysarthria, expressive aphasia, difficulty swallowing and chest discomfort.  Stroke:  Right brain infarct not seen on imaging, likely very small, stroke is embolic secondary to known atrial fibrillation on Eliquis  CT head no acute finding or change since 2019  CTA head & neck no ELVO. R ICA 60% narrowing at the bulb. Bronchomalacia  w/ biapical lung opacity.  CT perfusion 0 mL  Repeat CT head C#2 no acute stroke seen. Small vessel disease. Atrophy.   Carotid Doppler  R ICA 1-39% high end, L ICA 1-39%. B VA flow antegrade  2D Echo  EF 60-65%. No source of embolus   LDL 52  HgbA1c 6.3  Eliquis for VTE prophylaxis  Eliquis (apixaban) daily prior to admission, now on Eliquis (apixaban) daily. Discussed changing AC drug, but these is not evidence to support this change. Pt and son ok to continue Eliquis as PTA. No indication to add aspirin to Eliquis d/t risk of bleeding. This was discussed w/ Dr. Reesa Chew.  Therapy recommendations:  CIR  Disposition:  pending   [place] for d/c to IP  rehab  Follow up with stroke clinic 4 weeks after d/c from IP rehab  Stroke team will sign off. Call for questions.  Carotid Stenosis  CTA neck R ICA 60% narrowing at the bulb.   Carotid Doppler  R ICA 1-39% high end  VVS consulted - plan follow up as OP  As current stroke r/t   No indication to add aspirin given risk of bleeding  Atrial Fibrillation  Home anticoagulation:  Eliquis (apixaban) daily continued in the hospital . Continue Eliquis (apixaban) daily at discharge  Hypertension  Stable . 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 No records available for review  Coronary artery disease  Congestive heart failure  Hx bx for temporal arteritis - negative  Pacemaker  Other Active Problems  hypothyroidism  Hospital day # [place], MSN, APRN, ANVP-BC, AGPCNP-BC Advanced Practice Stroke Nurse [place] for Schedule & Pager information 04/27/2018 11:03 AM  I have personally obtained history,examined this patient, reviewed notes, independently viewed imaging studies, participated in medical decision making and plan of care.ROS completed by me personally and pertinent positives fully documented  I have made any additions or clarifications directly to the above note. Agree with note above.  She presented with left-sided paresthesias as well as some diplopia and gait ataxia possibly a posterior circulation TIA.CT angiogram of the neck suggested bilateral significant carotid stenosis but the degree of calcification  may have overestimated this has carotid ultrasound shows no major stenosis and any way her symptoms sound more like posterior circulation TIA. Recommend  Continue anticoagulation for stroke prevention given history of A. Fib.I do not believe carotid revascularization is indicated at the present time. Aggressive risk factor modification. Discussed with Dr. Reesa Chew. Greater than 50% time during this 35 minute visit was spent on counseling and coordination of care about her TIA in discussion but carotid stenosis and atrial fibrillation and development of plan of care and answered questions.follow-up as an outpatient in stroke clinic in 6 weeks. Stroke team will sign off. Kindly call for questions. Antony Contras, MD Medical Director [place] Pager: [phone] 04/27/2018 3:23 PM  To contact Stroke Continuity provider, please refer to [email or web address] After hours, contact General  Neurology

## 2018-04-27 NOTE — Progress Notes (Signed)
Patient arrived on rehab unit ,were she was informed about rehab booklet and patient safety plan.

## 2018-04-27 NOTE — Telephone Encounter (Signed)
Spoke with [name] who went to assess Pt at hospital today.  Per [name]--0 episodes of afib since her ECV on 03/27/2018.  Pt will resume sending remotes this evening.  If no remote received rep will reset her device again 04/28/2018.

## 2018-04-27 NOTE — Progress Notes (Signed)
Meredith Staggers, MD  Physician  Physical Medicine and Rehabilitation  PMR Pre-admission  Signed  Date of Service:  04/27/2018 12:08 PM       Related encounter: ED to Hosp-Admission (Discharged) from 04/24/2018 in [facility]      Signed         Show:Clear all _0 Manual_1 Template_2 Copied  Added by: _3 Cristina Gong, RN_4 Meredith Staggers, MD  _5 Hover for details  PMR Admission Coordinator Pre-Admission Assessment  Patient: Meredith Gonzalez is an 83 y.o., female MRN: [id number] DOB: 17-Sep-1935 Height: _6  (165.1 cm) Weight: 59.4 kg  Insurance Information HMO:     PPO:      PCP:      IPA:      80/20:      OTHER: no HMO PRIMARY: Medicare a and b      Policy#: [id number]      Subscriber: pt Benefits:  Phone #: passport one online     Name: 04/27/2018 Eff. Date: a 06/24/1998 b 07/24/1999     Deduct: $1408      Out of Pocket Max: none      Life Max: none CIR: 100%      SNF: 20 full days Outpatient: 80%     Co-Pay: 20% Home Health: 100%      Co-Pay: none DME: 80%     Co-Pay: 20% Providers: pt choice  SECONDARY: [employer]      Policy#: [id number]      Subscriber: pt Medicare supplement plan  Medicaid Application Date:       Case Manager:  Disability Application Date:       Case Worker:   Emergency Contact Information         Contact Information    Name Relation Home Work Mobile   [place] Son   [phone]      Current Medical History  Patient Admitting Diagnosis: posterior circulation CVA  History of Present Illness:  83 year old female with medical history of diet controlled DM, CAD, CVA in 2000, hypothyroidism, HTN, Pacemaker and CHF. Presented 04/24/2018 with weakness and ataxia. Left arm tingling and chest pain. Noted dysarthria and expressive aphasia. Some difficulty swallowing pills.   Concerned for posterior circulation stroke. TPA not given for outside of window. CTA was negative for large vessel occlusion.  Echo with EF 60%. No MRI due to pacemaker incompatible for MRI. Right brain infarct not seen on imaging, likely small, and felt embolic secondary to known atrial fibrillation on Eliquis.  CT cerebral perfusion reveals narrowing of right ICA. TSH 66, free T4 0.44. Evaluated by Neuro and felt symptoms could be related to right carotid .   Vascular surgery consulted, Dr. Donnetta Hutching, who recommend follow up for right carotid endarterectomy with timing to be determined. Dr Leonie Man on 2/3 felt no need for carotid intervention from stroke standpoint. He discussed with patient and son via phone.   Neurology recommends continuing Eliquis but no Aspirin or Plavix. Resume home meds for hypertension. Continue statin but increase Lipitor form 40 to 80 mg. Synthroid started 50 mcg daily and to follow up as outpatient.Patient recently converted out of atrial fibrillation and she does not tolerate that rhythm well. On Amiodarone for rate control.  DM type 2 diet controlled with Hgb A1c 6.3.   Patient currently chest pain free.  Patient was DNR on acute hospital.   Patient's medical record from  [facility] has been reviewed by the rehabilitation admission coordinator and physician.  NIH Stroke scale: 0  Past Medical History      Past Medical History:  Diagnosis Date  . Anemia   . Anxiety   . Arthritis    "all over" (02/11/2018)  . Basal cell carcinoma    "left leg" (02/11/2018)  . Cervical spine fracture ([place]) 12/2017   "C1-2"  . CHF (congestive heart failure) ([place])   . Chronic bronchitis ([place])   . Chronic neck pain    "since I broke my neck 6-8 wk ago" (02/11/2018)  . Diabetic peripheral neuropathy ([place])   . Fibromyalgia   . Fracture of right humerus   . Generalized weakness   . Headache    "weekly" (02/11/2018)  . History of blood transfusion    "related to one of my femur surgeries" (02/11/2018)  . History of echocardiogram    Echo 8/18: EF 50-55, no  RWMA, Gr 1 DD, calcified AV leaflets, MAC, trivial MR, mod LAE, PASP 37  . Hypertension   . Hypothyroidism   . Incontinence of urine   . Ischemic stroke ([place]) 2000   "lost part of the vision in my right eye" (02/11/2018)  . Major depression, chronic   . Myocardial infarction ([place]) 06/2016  . Pacemaker   . Recurrent falls   . Syncope and collapse   . Type 2 diabetes, diet controlled ([place])     Family History   family history includes Alcoholism in her brother, brother, father, and sister; Congestive Heart Failure in her mother; Heart attack in her father; Throat cancer in her brother.  Prior Rehab/Hospitalizations Has the patient had major surgery during 100 days prior to admission? No              Has been to [facility] twice in the past; did not like the facility.  Current Medications  Current Facility-Administered Medications:  .  acetaminophen (TYLENOL) tablet 650 mg, 650 mg, Oral, Q4H PRN, 650 mg at 04/26/18 1200 **OR** acetaminophen (TYLENOL) solution 650 mg, 650 mg, Per Tube, Q4H PRN **OR** acetaminophen (TYLENOL) suppository 650 mg, 650 mg, Rectal, Q4H PRN, Karmen Bongo, MD .  amiodarone (PACERONE) tablet 200 mg, 200 mg, Oral, Ledell Noss, MD, 200 mg at 04/27/18 0709 .  apixaban (ELIQUIS) tablet 2.5 mg, 2.5 mg, Oral, BID, Vann, Jessica U, DO, 2.5 mg at 04/27/18 1005 .  aspirin chewable tablet 324 mg, 324 mg, Oral, Once, Karmen Bongo, MD .  Derrill Memo ON 04/28/2018] atorvastatin (LIPITOR) tablet 40 mg, 40 mg, Oral, Daily, Biby, Sharon L, NP .  gabapentin (NEURONTIN) capsule 300 mg, 300 mg, Oral, Daily, Karmen Bongo, MD, 300 mg at 04/27/18 1005 .  gabapentin (NEURONTIN) capsule 600 mg, 600 mg, Oral, QHS, Karmen Bongo, MD, 600 mg at 04/26/18 2143 .  levothyroxine (SYNTHROID, LEVOTHROID) tablet 50 mcg, 50 mcg, Oral, Q0600, Radene Gunning, NP, 50 mcg at 04/27/18 0532 .  senna-docusate (Senokot-S) tablet 1 tablet, 1 tablet, Oral, QHS PRN, Karmen Bongo, MD .  simethicone [facility]) chewable tablet 80 mg, 80 mg, Oral, Q6H PRN, Geradine Girt, DO, 80 mg at 04/26/18 2330  Patients Current Diet:      Diet Order                  Diet Heart Room service appropriate? Yes; Fluid consistency: Thin  Diet effective ____               Precautions / Restrictions Precautions Precautions: Fall Precaution Comments: has fallen a lot with injuries over past 3  years recent in Sept broke her neck Restrictions Weight Bearing Restrictions: No   Has the patient had 2 or more falls or a fall with injury in the past year?Yes  Cervical fracture September 2019. Was using collar until recently  Prior Activity Level Community (5-7x/wk): Mod I with AD; drives  Prior Functional Level Do you want Prior Function Level of Independence: Independent with assistive device(s) Comments: ADLs and light IADLs. driving. Son assists with heavier IADL including vaccuuming. Pt uses RW as she feels she needs. from other? Self Care: Did the patient need help bathing, dressing, using the toilet or eating?  Independent  Indoor Mobility: Did the patient need assistance with walking from room to room (with or without device)? Independent  Stairs: Did the patient need assistance with internal or external stairs (with or without device)? Independent  Functional Cognition: Did the patient need help planning regular tasks such as shopping or remembering to take medications? Independent  Home Assistive Devices / [place] Devices/Equipment: Gilford Rile (specify type) Home Equipment: Walker - 4 wheels, Walker - 2 wheels, Bedside commode, Shower seat - built in, [employer] - tub/shower  Prior Device Use: Indicate devices/aids used by the patient prior to current illness, exacerbation or injury? Walker  Prior Functional Level Vocation: Retired Comments: ADLs and light IADLs. driving. Son assists with heavier IADL including vaccuuming. Pt  uses RW as she feels she needs.   Prior Functional Level Current Functional Level  Bed Mobility  Independent  Min assist  Transfers  Independent   Min assist  Mobility - Walk/Wheelchair  mod I with RW  Mod assist with multiple LOB with RW  Mobility - Ambulation/Gait  Mod I Mod assist, Max assist 50 feet with RW  Upper Body Dressing  Independent Minimal assistance, Sitting  Lower Body Dressing  Independent Minimal assistance, Sit to/from stand  Grooming  Independent Minimal assistance, Min guard, Brushing hair, Oral care, Standing  Eating/Drinking  Independent Set up, Supervision/ safety, Sitting  Toilet Transfer  Independent Minimal assistance, Ambulation, RW(simulated in room)  Bladder Continence  Continent  continent  Bowel Management   Continent   continent  Stair Climbing  Mod I but does not take stairs   Not attempted  Communication  Independent Expressive difficulties(dyarthric)  Memory  Intact  deficits noted in memory and visuospatial/executive function and reasoning complex info.  Cooking/Meal Prep  independent      Housework  son assists   Money Management  son assists   Driving    Patient drives    Patient with diplopia with right gaze . Wearing occlusion glasses. Decreased awareness of safety. Patient requires increased time throughout and presents with some difficulty processing and recalling words.  Special needs/care consideration BiPAP/CPAP n/a CPM n/a Continuous Drip IV n/a Dialysis n/a Life Vest n/a Oxygen n/a Special Bed n/a Trach Size n/a Wound Vac n/a Skin right ankle abrasion Bowel mgmt: continent LBM 2/1 Bladder mgmt: continent Diabetic mgmt diet controlled Hgb A1c 6.3 New diagnosis hypothyroidism Biotonik pacemaker device at bedside   Previous Home Environment Living Arrangements: Alone  Lives With: Alone Available Help at Discharge: Family, Available PRN/intermittently Type of Home: Other(Comment) Home Layout: ([place]; cluster retirement home) Home Access: Level entry Bathroom Shower/Tub: Walk-in Radio producer: Standard Bathroom Accessibility: Yes How Accessible: Accessible via walker [place]: No Additional Comments: living in "cluster retirement home"  Discharge Living Setting Plans for Discharge Living Setting: Patient's home, Alone(ILF cluster retirement home) Type of Home  at Discharge: [place] Name at Discharge: [place] Discharge Home Layout: One level Discharge Home Access: Level entry Discharge Bathroom Shower/Tub: Walk-in shower Discharge Bathroom Toilet: Standard Discharge Bathroom Accessibility: Yes How Accessible: Accessible via walker Does the patient have any problems obtaining your medications?: No  Social/Family/Support Systems Patient Roles: Parent Contact Information: Salwa Bai, son Anticipated Caregiver: son prn Anticipated Caregiver's Contact Information: [phone] Ability/Limitations of Caregiver: son works Building control surveyor Availability: Intermittent Discharge Plan Discussed with Primary Caregiver: Yes Is Caregiver In Agreement with Plan?: Yes Does Caregiver/Family have Issues with Lodging/Transportation while Pt is in Rehab?: No  Goals/Additional Needs Patient/Family Goal for Rehab: Mod I PT, OT, and SLP Expected length of stay: ELOS 10 to 14 days Equipment Needs: Pacer compant to bring home monitoring device for pt to take home Pt/Family Agrees to Admission and willing to participate: Yes Program Orientation Provided & Reviewed with Pt/Caregiver Including Roles  & Responsibilities: Yes  Patient Condition: Patient will benefit from ongoing PT, OT, and SLP offered from the coordinated team approach during an Inpatient Acute Rehabilitation admission. She will receive 3 hours of therapy per day as well as Rehabilitation physician, Nursing and Care Management  interventions. She is currently min to mod assist with all mobility and ADLs. Patient can actively participate in an intensive therapy program of at least 3 hrs of therapy 5 days a week and make measurable gains while admitted. Anticipated discharge setting is home at Mod I level. Patient and family have agree to participate in the Acute Rehabilitation program. We will admit today.   Preadmission Screen Completed By:  Cleatrice Burke RN MSN, 04/27/2018 12:08 PM ______________________________________________________________________   Discussed status with Dr. Naaman Plummer  on  04/27/2018  at  1300 and received telephone approval for admission today.  Admission Coordinator:  Cleatrice Burke, time  1300 Date  04/27/2018   Assessment/Plan: Diagnosis: posterior circulation infarct 1. Does the need for close, 24 hr/day  Medical supervision in concert with the patient's rehab needs make it unreasonable for this patient to be served in a less intensive setting? Yes 2. Co-Morbidities requiring supervision/potential complications: chf, hypotension 3. Due to bladder management, bowel management, safety, skin/wound care, disease management, medication administration, pain management and patient education, does the patient require 24 hr/day rehab nursing? Yes 4. Does the patient require coordinated care of a physician, rehab nurse, PT (1-2 hrs/day, 5 days/week), OT (1-2 hrs/day, 5 days/week) and SLP (1-2 hrs/day, 5 days/week) to address physical and functional deficits in the context of the above medical diagnosis(es)? Yes Addressing deficits in the following areas: balance, endurance, locomotion, strength, transferring, bowel/bladder control, bathing, dressing, feeding, grooming, toileting, cognition and psychosocial support 5. Can the patient actively participate in an intensive therapy program of at least 3 hrs of therapy 5 days a week? Yes 6. The potential for patient to make measurable gains while on  inpatient rehab is excellent 7. Anticipated functional outcomes upon discharge from inpatients are: modified independent PT, modified independent OT, modified independent SLP 8. Estimated rehab length of stay to reach the above functional goals is: 10-14 days 9. Anticipated D/C setting: Home 10. Anticipated post D/C treatments: [place] therapy 11. Overall Rehab/Functional Prognosis: excellent    RECOMMENDATIONS: This patient's condition is appropriate for continued rehabilitative care in the following setting: CIR Patient has agreed to participate in recommended program. Yes  Comment: Meredith Staggers, MD, [place] [facility] 04/27/2018  Cleatrice Burke RN MSN 04/27/2018  Revision History

## 2018-04-27 NOTE — Progress Notes (Signed)
Inpatient Rehabilitation Admissions Coordinator  I met with pt at bedside and then contacted her son by phone to discuss goals an expectations of an inpt rehab admit. Both in agreement to admit. I spoke with Dr. Reesa Chew and Dr. Leonie Man. Both in agreement to d/c to CIR today. I will make the arrangements to admit today. RN CM and SW made aware.  Danne Baxter, RN, MSN Rehab Admissions Coordinator [phone] 04/27/2018 11:48 AM '

## 2018-04-27 NOTE — H&P (Signed)
Physical Medicine and Rehabilitation Admission H&P    Chief Complaint  Patient presents with  . Dizziness  : HPI: Meredith Gonzalez is an 83 year old right handed female history of diet-controlled diabetes mellitus, CAD, CVA in 2000, hypertension, diastolic congestive heart failure, atrial fibrillation with pacemaker maintained on Eliquis. Per chart review patient lives alone at [facility] independent living facility in [place]. Independent with assistive device. She has a son that lives in [place]. Presented 04/24/2018 with left-sided weakness and ataxia. Cranial CT scan negative. Patient did not receive TPA. CT perfusion scan as well as angiogram of head and neck showed moderate to severe 60-70% right carotid bifurcation stenosis. Follow-up renal CT scan to 04/13/2018 negative. MRI not completed due to pacemaker. Echocardiogram with ejection fraction of 58% normal systolic function. Vascular surgery Dr. Donnetta Hutching follow-up for carotid stenosis plan right carotid enterectomy after recovery of CVA to follow-up as outpatient. Patient remains on Eliquis as prior to admission for CVA prophylaxis as well as history of atrial fibrillation. Therapy evaluations completed with recommendations of physical medicine rehabilitation consult. Patient was admitted for a compress of rehabilitation program.  Review of Systems  Constitutional: Negative for chills and fever.  HENT: Negative for hearing loss.   Eyes: Negative for blurred vision and double vision.  Respiratory: Positive for shortness of breath.   Cardiovascular: Positive for palpitations and leg swelling.  Gastrointestinal: Positive for constipation. Negative for nausea and vomiting.  Genitourinary: Negative for dysuria, flank pain and hematuria.       Urinary stress incontinence  Musculoskeletal: Positive for myalgias.  Skin: Negative for rash.  Neurological: Positive for dizziness, focal weakness and  headaches.  Psychiatric/Behavioral: Positive for depression.  All other systems reviewed and are negative.  Past Medical History:  Diagnosis Date  . Anemia   . Anxiety   . Arthritis    "all over" (02/11/2018)  . Basal cell carcinoma    "left leg" (02/11/2018)  . Cervical spine fracture ([place]) 12/2017   "C1-2"  . CHF (congestive heart failure) ([place])   . Chronic bronchitis ([place])   . Chronic neck pain    "since I broke my neck 6-8 wk ago" (02/11/2018)  . Diabetic peripheral neuropathy ([place])   . Fibromyalgia   . Fracture of right humerus   . Generalized weakness   . Headache    "weekly" (02/11/2018)  . History of blood transfusion    "related to one of my femur surgeries" (02/11/2018)  . History of echocardiogram    Echo 8/18: EF 50-55, no RWMA, Gr 1 DD, calcified AV leaflets, MAC, trivial MR, mod LAE, PASP 37  . Hypertension   . Hypothyroidism   . Incontinence of urine   . Ischemic stroke ([place]) 2000   "lost part of the vision in my right eye" (02/11/2018)  . Major depression, chronic   . Myocardial infarction ([place]) 06/2016  . Pacemaker   . Recurrent falls   . Syncope and collapse   . Type 2 diabetes, diet controlled ([place])    Past Surgical History:  Procedure Laterality Date  . ABDOMINAL HYSTERECTOMY    . ANKLE FRACTURE SURGERY Right   . BASAL CELL CARCINOMA EXCISION Left    "leg"  . CARDIAC CATHETERIZATION  ~ 2014  . CARDIOVERSION N/A 02/13/2018   Procedure: CARDIOVERSION;  Surgeon: Sueanne Margarita, MD;  Location: [facility] ENDOSCOPY;  Service: Cardiovascular;  Laterality: N/A;  . CARDIOVERSION N/A 03/27/2018   Procedure: CARDIOVERSION;  Surgeon: Lelon Perla,  MD;  Location: [place];  Service: Cardiovascular;  Laterality: N/A;  . CARPAL TUNNEL RELEASE     bilateral  . CATARACT EXTRACTION W/ INTRAOCULAR LENS  IMPLANT, BILATERAL Bilateral   . CHOLECYSTECTOMY OPEN    . COLONOSCOPY    . CORONARY ANGIOPLASTY WITH STENT PLACEMENT  06/2016  . CORONARY STENT INTERVENTION  N/A 07/19/2016   Procedure: Coronary Stent Intervention;  Surgeon: Peter M Martinique, MD;  Location: [place] CV LAB;  Service: Cardiovascular;  Laterality: N/A;  . EYE SURGERY     right eye catarace/lens implant  . FEMUR FRACTURE SURGERY Bilateral   . FRACTURE SURGERY    . GASTRIC BYPASS    . INSERT / REPLACE / REMOVE PACEMAKER  ~ 2012  . LEAD REVISION N/A 01/01/2013   Procedure: LEAD REVISION;  Surgeon: Evans Lance, MD;  Location: [facility] CATH LAB;  Service: Cardiovascular;  Laterality: N/A;  . LEFT HEART CATH AND CORONARY ANGIOGRAPHY N/A 07/19/2016   Procedure: Left Heart Cath and Coronary Angiography;  Surgeon: Peter M Martinique, MD;  Location: [place] CV LAB;  Service: Cardiovascular;  Laterality: N/A;  . OVARIAN CYST SURGERY     "one side only"  . TEE WITHOUT CARDIOVERSION N/A 02/13/2018   Procedure: TRANSESOPHAGEAL ECHOCARDIOGRAM (TEE);  Surgeon: Sueanne Margarita, MD;  Location: [facility] ENDOSCOPY;  Service: Cardiovascular;  Laterality: N/A;  . TONSILLECTOMY     Family History  Problem Relation Age of Onset  . Congestive Heart Failure Mother   . Heart attack Father   . Alcoholism Father   . Alcoholism Sister   . Alcoholism Brother   . Alcoholism Brother   . Throat cancer Brother    Social History:  reports that she quit smoking about 45 years ago. Her smoking use included cigarettes. She has a 2.00 pack-year smoking history. She has never used smokeless tobacco. She reports current alcohol use. She reports that she does not use drugs. Allergies:  Allergies  Allergen Reactions  . Valium [Diazepam] Anxiety    Makes patient hyper Makes patient hyper  . Codeine Hives and Itching    Can take with Benadryl Can take with Benadryl  . Darifenacin Itching    Can take with Benadryl  . Darvon [Propoxyphene Hcl] Itching    Can take with Benadryl  . Daypro [Oxaprozin] Itching    Can take with Benadryl Can take with Benadryl  . Enablex [Darifenacin Hydrobromide Er] Itching    Can take  with Benadryl  . Oxycodone Itching    Can take with Benadryl Can take with Benadryl Can take with Benadryl Can take with Benadryl  . Propoxyphene Itching    Can take with Benadryl  . Risperdal [Risperidone] Itching    Can take with Benadryl Can take with Benadryl  . Talwin [Pentazocine] Itching    Can take with Benadryl Can take with Benadryl  . Vicodin [Hydrocodone-Acetaminophen] Itching    Can take with Benadryl Can take with Benadryl   Medications Prior to Admission  Medication Sig Dispense Refill  . acetaminophen (TYLENOL) 500 MG tablet Take 500 mg by mouth every 6 (six) hours as needed for moderate pain.     Marland Kitchen albuterol (PROVENTIL HFA;VENTOLIN HFA) 108 (90 Base) MCG/ACT inhaler Inhale 2 puffs into the lungs every 6 (six) hours as needed for wheezing or shortness of breath.     Marland Kitchen amiodarone (PACERONE) 200 MG tablet Take 1 tablet (200 mg total) by mouth every morning. 90 tablet 3  . ammonium lactate (LAC-HYDRIN) 12 %  lotion Apply 1 application topically daily. In the morning after bathing  0  . apixaban (ELIQUIS) 5 MG TABS tablet Take 1 tablet (5 mg total) by mouth 2 (two) times daily. Prescription for free 30 day supply 60 tablet 0  . atorvastatin (LIPITOR) 40 MG tablet Take 1 tablet (40 mg total) by mouth daily. 90 tablet 3  . Calcium Citrate-Vitamin D3 315-250 MG-UNIT TABS Take 1 tablet by mouth 2 (two) times daily.    . diphenhydrAMINE (BENADRYL ALLERGY) 25 MG tablet Take 25 mg by mouth every 8 (eight) hours as needed (itching/allergies.).     Marland Kitchen ferrous sulfate 325 (65 FE) MG tablet Take 1 tablet (325 mg total) by mouth 2 (two) times daily with a meal. (Patient taking differently: Take 325 mg by mouth daily with breakfast. ) 60 tablet 0  . flintstones complete (FLINTSTONES) 60 MG chewable tablet Chew 1 tablet by mouth daily.    . fluticasone (FLONASE) 50 MCG/ACT nasal spray Place 1 spray into both nostrils daily as needed (allergies.).   4  . furosemide (LASIX) 20 MG tablet Take  20 mg by mouth daily.    . furosemide (LASIX) 40 MG tablet Take 40 mg by mouth daily.    Marland Kitchen gabapentin (NEURONTIN) 300 MG capsule Take 300-600 mg by mouth See admin instructions. Take 1 capsule (300 mg) by mouth in the mornning & take 2 capsules (600 mg) by mouth at night    . Melatonin 10 MG TABS Take 40 mg by mouth at bedtime.    . Multiple Vitamins-Minerals (MULTIVITAMIN ADULT) TABS Take 1 tablet by mouth daily.    . potassium chloride (K-DUR) 10 MEQ tablet Take 2 tablets (20 mEq total) by mouth daily. 90 tablet 3  . pramipexole (MIRAPEX) 0.25 MG tablet Take 0.25 mg by mouth daily as needed (for mouth blisters).     . vitamin B-12 (CYANOCOBALAMIN) 1000 MCG tablet Take 1 tablet (1,000 mcg total) by mouth daily. 30 tablet 0  . nitroGLYCERIN (NITROSTAT) 0.4 MG SL tablet Place 1 tablet (0.4 mg total) under the tongue every 5 (five) minutes as needed for chest pain. 25 tablet 2  . pindolol (VISKEN) 5 MG tablet Take 10 mg in the AM and 5 mg in the PM daily by mouth 270 tablet 3    Drug Regimen Review Drug regimen was reviewed and remains appropriate with no significant issues identified  Home: Home Living Family/patient expects to be discharged to:: Private residence Living Arrangements: Alone Available Help at Discharge: Family, Available PRN/intermittently Type of Home: House Home Access: Level entry Home Layout: One level Bathroom Shower/Tub: Walk-in shower Home Equipment: Environmental consultant - 4 wheels, Environmental consultant - 2 wheels, Bedside commode, Shower seat - built in, [employer] - tub/shower Additional Comments: living in [facility]"  Lives With: Alone   Functional History: Prior Function Level of Independence: Independent with assistive device(s) Comments: ADLs and light IADLs. driving. Son assists with heavier IADL including vaccuuming. Pt uses RW as she feels she needs.  Functional Status:  Mobility: Bed Mobility Overal bed mobility: Needs Assistance Bed Mobility: Supine to  Sit Supine to sit: Min guard, HOB elevated Sit to supine: Min guard General bed mobility comments: Min Guard A for safety Transfers Overall transfer level: Needs assistance Equipment used: Rolling walker (2 wheeled) Transfers: Sit to/from Stand Sit to Stand: Min guard, Min assist General transfer comment: Min A for steadying in standing Ambulation/Gait Ambulation/Gait assistance: Mod assist, Max assist Gait Distance (Feet): 50 Feet Assistive device:  Rolling walker (2 wheeled) Gait Pattern/deviations: Step-to pattern, Step-through pattern, Ataxic, Narrow base of support, Decreased stride length General Gait Details: multiple LOB max A to recover; cues for wider BOS as R foot crossing over in middle and ataxic L foot placement.  assist for walker management and cues for eyes on stationary target as dizzy and with double vision    ADL: ADL Overall ADL's : Needs assistance/impaired Eating/Feeding: Set up, Supervision/ safety, Sitting Grooming: Minimal assistance, Min guard, Brushing hair, Oral care, Standing Grooming Details (indicate cue type and reason): Pt requiring MIn Guard A -Min A for balance and safety at sink. Pt with decreased FM skills, grasp strength, depth perception, and vision. Pt overshooting when reaching out for faucet or grooming items Upper Body Bathing: Minimal assistance, Sitting Lower Body Bathing: Minimal assistance, Sit to/from stand Upper Body Dressing : Minimal assistance, Sitting Upper Body Dressing Details (indicate cue type and reason): doning house coat with Min A. Doffed with Min Guard A while seated at EOB Lower Body Dressing: Minimal assistance, Sit to/from stand Lower Body Dressing Details (indicate cue type and reason): Pt able to bend forward to adjut socks Toilet Transfer: Minimal assistance, Ambulation, RW(simulated in room) Functional mobility during ADLs: Minimal assistance, Min guard, Rolling walker General ADL Comments: Pt demonstrating progress.  Continues to present with decreased balance, strength, and vision.   Cognition: Cognition Overall Cognitive Status: Impaired/Different from baseline Arousal/Alertness: Awake/alert Orientation Level: Oriented X4 Memory: Impaired Memory Impairment: Retrieval deficit, Decreased recall of new information, Decreased short term memory Decreased Short Term Memory: Verbal basic, Verbal complex Awareness: Appears intact Problem Solving: Appears intact Executive Function: Reasoning Reasoning: Impaired Reasoning Impairment: Verbal complex, Functional complex Behaviors: (anxious) Safety/Judgment: Appears intact Cognition Arousal/Alertness: Awake/alert Behavior During Therapy: WFL for tasks assessed/performed Overall Cognitive Status: Impaired/Different from baseline Area of Impairment: Safety/judgement, Attention Current Attention Level: Sustained Safety/Judgement: Decreased awareness of deficits, Decreased awareness of safety General Comments: Pt requiring increased time throughout and present with some difficulty processing and recalling words. Pt motivated to participate in therapy.   Physical Exam: Blood pressure (!) 158/69, pulse 70, temperature 98 F (36.7 C), temperature source Oral, resp. rate 18, height _0  (1.651 m), weight 59.4 kg, SpO2 96 %. Physical Exam  Constitutional: She appears well-developed and well-nourished.  HENT:  Head: Normocephalic and atraumatic.  Eyes: EOM are normal.  Neck: Normal range of motion. No tracheal deviation present. No thyromegaly present.  Cardiovascular: Normal rate and regular rhythm. Exam reveals no friction rub.  No murmur heard. Respiratory: Effort normal. No respiratory distress. She has no wheezes. She has no rales.  GI: Soft. She exhibits no distension. There is no abdominal tenderness.  Neurological:  Patient is alert and pleasant. Makes good eye contact with examiner follows full commands. She provides her name and age as well as date  of birth. Fair awareness of deficits.She did have some difficulty with higher level executive processing. Mild left central 7. Speech clear. RUE 4/5. LUE 4- to 4/5. RLE 3+ to 4/5. LLE 3+ to 4-/5. No focal sensory findings.   Psychiatric: She has a normal mood and affect. Her behavior is normal.    Results for orders placed or performed during the hospital encounter of 04/24/18 (from the past 48 hour(s))  Vitamin B12     Status: None   Collection Time: 04/26/18  5:29 AM  Result Value Ref Range   Vitamin B-12 264 180 - 914 pg/mL    Comment: (NOTE) This assay is not validated  for testing neonatal or myeloproliferative syndrome specimens for Vitamin B12 levels. Performed at [facility], [place] [address], [place], [place] [address]   CBC     Status: Abnormal   Collection Time: 04/26/18  5:29 AM  Result Value Ref Range   WBC 6.5 4.0 - 10.5 K/uL   RBC 3.49 (L) 3.87 - 5.11 MIL/uL   Hemoglobin 9.7 (L) 12.0 - 15.0 g/dL   HCT 33.9 (L) 36.0 - 46.0 %   MCV 97.1 80.0 - 100.0 fL   MCH 27.8 26.0 - 34.0 pg   MCHC 28.6 (L) 30.0 - 36.0 g/dL   RDW 16.2 (H) 11.5 - 15.5 %   Platelets 185 150 - 400 K/uL   nRBC 0.0 0.0 - 0.2 %    Comment: Performed at [facility], [place] [address] [place], [place] [address]  Basic metabolic panel     Status: Abnormal   Collection Time: 04/26/18  5:29 AM  Result Value Ref Range   Sodium 140 135 - 145 mmol/L   Potassium 3.7 3.5 - 5.1 mmol/L   Chloride 108 98 - 111 mmol/L   CO2 22 22 - 32 mmol/L   Glucose, Bld 151 (H) 70 - 99 mg/dL   BUN 21 8 - 23 mg/dL   Creatinine, Ser 0.96 0.44 - 1.00 mg/dL   Calcium 7.9 (L) 8.9 - 10.3 mg/dL   GFR calc non Af Amer 56 (L) >60 mL/min   GFR calc Af Amer >60 >60 mL/min   Anion gap 10 5 - 15    Comment: Performed at [facility] [address], [place], [place] [address]   No results found.     Medical Problem List and Plan: 1.  Ataxic gait with left-sided weakness secondary to subcortical/brainstem CVA. MRI  not completed due to pacemaker  -admit to inpatient rehab  -plan for follow up with vascular surgery as outpt after discharge to discuss potential CEA 2.  DVT Prophylaxis/Anticoagulation: Eliquis 3. Pain Management:  Neurontin 300 mg daily at 600 mg daily at bedtime 4. Mood:  Provide emotional support 5. Neuropsych: This patient is capable of making decisions on her own behalf. 6. Skin/Wound Care:  Routine skin checks 7. Fluids/Electrolytes/Nutrition:  Routine in and out's with follow-up chemistries 8. Atrial fibrillation with history of pacemaker. Continue amiodarone 200 mg daily.  -heart rate regular on eval 9. Diastolic congestive heart failure. Monitor for any signs of fluid overload 10. Hypothyroidism. Synthroid 11. Hyperlipidemia. Lipitor 12. Diet controlled diabetes mellitus. Blood sugar checks discontinued as sugars well controlled 13. Constipation. Laxative assistance  Post Admission Physician Evaluation: 1. Functional deficits secondary  to right subcortical infarct. 2. Patient is admitted to receive collaborative, interdisciplinary care between the physiatrist, rehab nursing staff, and therapy team. 3. Patient's level of medical complexity and substantial therapy needs in context of that medical necessity cannot be provided at a lesser intensity of care such as a SNF. 4. Patient has experienced substantial functional loss from his/her baseline which was documented above under the "Functional History" and "Functional Status" headings.  Judging by the patient's diagnosis, physical exam, and functional history, the patient has potential for functional progress which will result in measurable gains while on inpatient rehab.  These gains will be of substantial and practical use upon discharge  in facilitating mobility and self-care at the household level. 5. Physiatrist will provide 24 hour management of medical needs as well as oversight of the therapy plan/treatment and provide guidance  as appropriate regarding the interaction of the two. 6. The Preadmission Screening has been reviewed and patient status is unchanged unless otherwise stated above. 7. 24 hour rehab nursing will assist with bladder management, bowel management, safety, skin/wound care, disease management, medication administration, pain management and patient education  and help integrate therapy concepts, techniques,education, etc. 8. PT will assess and treat for/with: Lower extremity strength, range of motion, stamina, balance, functional mobility, safety, adaptive techniques and equipment, NMR, community reentry.   Goals are: mod I. 9. OT will assess and treat for/with: ADL's, functional mobility, safety, upper extremity strength, adaptive techniques and equipment, NMR, community reentry.   Goals are: mod I. Therapy may proceed with showering this patient. 10. SLP will assess and treat for/with: cognition, communication.  Goals are: mod I. 11. Case Management and Social Worker will assess and treat for psychological issues and discharge planning. 12. Team conference will be held weekly to assess progress toward goals and to determine barriers to discharge. 13. Patient will receive at least 3 hours of therapy per day at least 5 days per week. 14. ELOS: 10-14 days       15. Prognosis:  excellent   I have personally performed a face to face diagnostic evaluation of this patient and formulated the key components of the plan.  Additionally, I have personally reviewed laboratory data, imaging studies, as well as relevant notes and concur with the physician assistant's documentation above.  Meredith Staggers, MD, FAAPMR    Lavon Paganini [place], PA-C 04/27/2018'

## 2018-04-27 NOTE — PMR Pre-admission (Signed)
PMR Admission Coordinator Pre-Admission Assessment  Patient: Meredith Gonzalez is an 83 y.o., female MRN: [id number] DOB: 1936/03/19 Height: _0  (165.1 cm) Weight: 59.4 kg  Insurance Information HMO:     PPO:      PCP:      IPA:      80/20:      OTHER: no HMO PRIMARY: Medicare a and b      Policy#: [id number]      Subscriber: pt Benefits:  Phone #: passport one online     Name: 04/27/2018 Eff. Date: a 06/24/1998 b 07/24/1999     Deduct: $1408      Out of Pocket Max: none      Life Max: none CIR: 100%      SNF: 20 full days Outpatient: 80%     Co-Pay: 20% Home Health: 100%      Co-Pay: none DME: 80%     Co-Pay: 20% Providers: pt choice  SECONDARY: [employer]      Policy#: [id number]      Subscriber: pt Medicare supplement plan  Medicaid Application Date:       Case Manager:  Disability Application Date:       Case Worker:   Emergency Contact Information Contact Information    Name Relation Home Work Mobile   Almira Son   [phone]      Current Medical History  Patient Admitting Diagnosis: posterior circulation CVA  History of Present Illness:  83 year old female with medical history of diet controlled DM, CAD, CVA in 2000, hypothyroidism, HTN, Pacemaker and CHF. Presented 04/24/2018 with weakness and ataxia. Left arm tingling and chest pain. Noted dysarthria and expressive aphasia. Some difficulty swallowing pills.   Concerned for posterior circulation stroke. TPA not given for outside of window. CTA was negative for large vessel occlusion. Echo with EF 60%. No MRI due to pacemaker incompatible for MRI. Right brain infarct not seen on imaging, likely small, and felt embolic secondary to known atrial fibrillation on Eliquis.  CT cerebral perfusion reveals narrowing of right ICA. TSH 66, free T4 0.44. Evaluated by Neuro and felt symptoms could be related to right carotid .   Vascular surgery consulted, Dr. Donnetta Hutching, who recommend follow up for right carotid endarterectomy with  timing to be determined. Dr Leonie Man on 2/3 felt no need for carotid intervention from stroke standpoint. He discussed with patient and son via phone.   Neurology recommends continuing Eliquis but no Aspirin or Plavix. Resume home meds for hypertension. Continue statin but increase Lipitor form 40 to 80 mg. Synthroid started 50 mcg daily and to follow up as outpatient.Patient recently converted out of atrial fibrillation and she does not tolerate that rhythm well. On Amiodarone for rate control.  DM type 2 diet controlled with Hgb A1c 6.3.   Patient currently chest pain free.  Patient was DNR on acute hospital.   Patient's medical record from  [facility] has been reviewed by the rehabilitation admission coordinator and physician.  NIH Stroke scale: 0  Past Medical History  Past Medical History:  Diagnosis Date  . Anemia   . Anxiety   . Arthritis    "all over" (02/11/2018)  . Basal cell carcinoma    "left leg" (02/11/2018)  . Cervical spine fracture ([place]) 12/2017   "C1-2"  . CHF (congestive heart failure) ([place])   . Chronic bronchitis ([place])   . Chronic neck pain    "since I broke my neck 6-8 wk ago" (02/11/2018)  .  Diabetic peripheral neuropathy ([place])   . Fibromyalgia   . Fracture of right humerus   . Generalized weakness   . Headache    "weekly" (02/11/2018)  . History of blood transfusion    "related to one of my femur surgeries" (02/11/2018)  . History of echocardiogram    Echo 8/18: EF 50-55, no RWMA, Gr 1 DD, calcified AV leaflets, MAC, trivial MR, mod LAE, PASP 37  . Hypertension   . Hypothyroidism   . Incontinence of urine   . Ischemic stroke ([place]) 2000   "lost part of the vision in my right eye" (02/11/2018)  . Major depression, chronic   . Myocardial infarction ([place]) 06/2016  . Pacemaker   . Recurrent falls   . Syncope and collapse   . Type 2 diabetes, diet controlled ([place])     Family History   family history includes Alcoholism in her  brother, brother, father, and sister; Congestive Heart Failure in her mother; Heart attack in her father; Throat cancer in her brother.  Prior Rehab/Hospitalizations Has the patient had major surgery during 100 days prior to admission? No   Has been to [facility] twice in the past; did not like the facility.  Current Medications  Current Facility-Administered Medications:  .  acetaminophen (TYLENOL) tablet 650 mg, 650 mg, Oral, Q4H PRN, 650 mg at 04/26/18 1200 **OR** acetaminophen (TYLENOL) solution 650 mg, 650 mg, Per Tube, Q4H PRN **OR** acetaminophen (TYLENOL) suppository 650 mg, 650 mg, Rectal, Q4H PRN, Karmen Bongo, MD .  amiodarone (PACERONE) tablet 200 mg, 200 mg, Oral, Ledell Noss, MD, 200 mg at 04/27/18 0709 .  apixaban (ELIQUIS) tablet 2.5 mg, 2.5 mg, Oral, BID, Vann, Jessica U, DO, 2.5 mg at 04/27/18 1005 .  aspirin chewable tablet 324 mg, 324 mg, Oral, Once, Karmen Bongo, MD .  Derrill Memo ON 04/28/2018] atorvastatin (LIPITOR) tablet 40 mg, 40 mg, Oral, Daily, Biby, Sharon L, NP .  gabapentin (NEURONTIN) capsule 300 mg, 300 mg, Oral, Daily, Karmen Bongo, MD, 300 mg at 04/27/18 1005 .  gabapentin (NEURONTIN) capsule 600 mg, 600 mg, Oral, QHS, Karmen Bongo, MD, 600 mg at 04/26/18 2143 .  levothyroxine (SYNTHROID, LEVOTHROID) tablet 50 mcg, 50 mcg, Oral, Q0600, Radene Gunning, NP, 50 mcg at 04/27/18 0532 .  senna-docusate (Senokot-S) tablet 1 tablet, 1 tablet, Oral, QHS PRN, Karmen Bongo, MD .  simethicone [facility]) chewable tablet 80 mg, 80 mg, Oral, Q6H PRN, Geradine Girt, DO, 80 mg at 04/26/18 2330  Patients Current Diet:   Diet Order            Diet Heart Room service appropriate? Yes; Fluid consistency: Thin  Diet effective ____              Precautions / Restrictions Precautions Precautions: Fall Precaution Comments: has fallen a lot with injuries over past 3 years recent in Sept broke her neck Restrictions Weight Bearing Restrictions: No    Has the patient had 2 or more falls or a fall with injury in the past year?Yes  Cervical fracture September 2019. Was using collar until recently  Prior Activity Level Community (5-7x/wk): Mod I with AD; drives  Prior Functional Level Do you want Prior Function Level of Independence: Independent with assistive device(s) Comments: ADLs and light IADLs. driving. Son assists with heavier IADL including vaccuuming. Pt uses RW as she feels she needs. from other? Self Care: Did the patient need help bathing, dressing, using the toilet or eating?  Independent  Indoor Mobility: Did  the patient need assistance with walking from room to room (with or without device)? Independent  Stairs: Did the patient need assistance with internal or external stairs (with or without device)? Independent  Functional Cognition: Did the patient need help planning regular tasks such as shopping or remembering to take medications? Independent  Home Assistive Devices / [place] Devices/Equipment: Gilford Rile (specify type) Home Equipment: Walker - 4 wheels, Walker - 2 wheels, Bedside commode, Shower seat - built in, [employer] - tub/shower  Prior Device Use: Indicate devices/aids used by the patient prior to current illness, exacerbation or injury? Walker  Prior Functional Level Vocation: Retired Comments: ADLs and light IADLs. driving. Son assists with heavier IADL including vaccuuming. Pt uses RW as she feels she needs.   Prior Functional Level Current Functional Level  Bed Mobility  Independent  Min assist  Transfers  Independent   Min assist  Mobility - Walk/Wheelchair  mod I with RW  Mod assist with multiple LOB with RW  Mobility - Ambulation/Gait  Mod I Mod assist, Max assist 50 feet with RW  Upper Body Dressing  Independent Minimal assistance, Sitting  Lower Body Dressing  Independent Minimal assistance, Sit to/from stand  Grooming  Independent Minimal assistance, Min guard, Brushing hair,  Oral care, Standing  Eating/Drinking  Independent Set up, Supervision/ safety, Sitting  Toilet Transfer  Independent Minimal assistance, Ambulation, RW(simulated in room)  Bladder Continence  Continent  continent  Bowel Management   Continent   continent  Stair Climbing  Mod I but does not take stairs   Not attempted  Communication  Independent Expressive difficulties(dyarthric)  Memory  Intact  deficits noted in memory and visuospatial/executive function and reasoning complex info.  Cooking/Meal Prep  independent      Housework  son assists   Money Management  son assists   Driving    Patient drives    Patient with diplopia with right gaze . Wearing occlusion glasses. Decreased awareness of safety. Patient requires increased time throughout and presents with some difficulty processing and recalling words.  Special needs/care consideration BiPAP/CPAP n/a CPM n/a Continuous Drip IV n/a Dialysis n/a Life Vest n/a Oxygen n/a Special Bed n/a Trach Size n/a Wound Vac n/a Skin right ankle abrasion Bowel mgmt: continent LBM 2/1 Bladder mgmt: continent Diabetic mgmt diet controlled Hgb A1c 6.3 New diagnosis hypothyroidism Biotonik pacemaker device at bedside   Previous Home Environment Living Arrangements: Alone  Lives With: Alone Available Help at Discharge: Family, Available PRN/intermittently Type of Home: Other(Comment) Home Layout: ([place]; cluster retirement home) Home Access: Level entry Bathroom Shower/Tub: Walk-in Radio producer: Standard Bathroom Accessibility: Yes How Accessible: Accessible via walker [place]: No Additional Comments: living in "cluster retirement home"  Discharge Living Setting Plans for Discharge Living Setting: Patient's home, Alone([facility] cluster retirement home) Type of Home at Discharge: [place] Name at Discharge: [place] Discharge Home  Layout: One level Discharge Home Access: Level entry Discharge Bathroom Shower/Tub: Walk-in shower Discharge Bathroom Toilet: Standard Discharge Bathroom Accessibility: Yes How Accessible: Accessible via walker Does the patient have any problems obtaining your medications?: No  Social/Family/Support Systems Patient Roles: Parent Contact Information: Gwendoline Judy, son Anticipated Caregiver: son prn Anticipated Caregiver's Contact Information: [phone] Ability/Limitations of Caregiver: son works Building control surveyor Availability: Intermittent Discharge Plan Discussed with Primary Caregiver: Yes Is Caregiver In Agreement with Plan?: Yes Does Caregiver/Family have Issues with Lodging/Transportation while Pt is in Rehab?: No  Goals/Additional Needs Patient/Family Goal  for Rehab: Mod I PT, OT, and SLP Expected length of stay: ELOS 10 to 14 days Equipment Needs: Pacer compant to bring home monitoring device for pt to take home Pt/Family Agrees to Admission and willing to participate: Yes Program Orientation Provided & Reviewed with Pt/Caregiver Including Roles  & Responsibilities: Yes  Patient Condition: Patient will benefit from ongoing PT, OT, and SLP offered from the coordinated team approach during an Inpatient Acute Rehabilitation admission. She will receive 3 hours of therapy per day as well as Rehabilitation physician, Nursing and Care Management interventions. She is currently min to mod assist with all mobility and ADLs. Patient can actively participate in an intensive therapy program of at least 3 hrs of therapy 5 days a week and make measurable gains while admitted. Anticipated discharge setting is home at Mod I level. Patient and family have agree to participate in the Acute Rehabilitation program. We will admit today.   Preadmission Screen Completed By:  Cleatrice Burke RN MSN, 04/27/2018 12:08 PM ______________________________________________________________________   Discussed  status with Dr. Naaman Plummer  on  04/27/2018  at  1300 and received telephone approval for admission today.  Admission Coordinator:  Cleatrice Burke, time  1300 Date  04/27/2018   Assessment/Plan: Diagnosis: posterior circulation infarct 1. Does the need for close, 24 hr/day  Medical supervision in concert with the patient's rehab needs make it unreasonable for this patient to be served in a less intensive setting? Yes 2. Co-Morbidities requiring supervision/potential complications: chf, hypotension 3. Due to bladder management, bowel management, safety, skin/wound care, disease management, medication administration, pain management and patient education, does the patient require 24 hr/day rehab nursing? Yes 4. Does the patient require coordinated care of a physician, rehab nurse, PT (1-2 hrs/day, 5 days/week), OT (1-2 hrs/day, 5 days/week) and SLP (1-2 hrs/day, 5 days/week) to address physical and functional deficits in the context of the above medical diagnosis(es)? Yes Addressing deficits in the following areas: balance, endurance, locomotion, strength, transferring, bowel/bladder control, bathing, dressing, feeding, grooming, toileting, cognition and psychosocial support 5. Can the patient actively participate in an intensive therapy program of at least 3 hrs of therapy 5 days a week? Yes 6. The potential for patient to make measurable gains while on inpatient rehab is excellent 7. Anticipated functional outcomes upon discharge from inpatients are: modified independent PT, modified independent OT, modified independent SLP 8. Estimated rehab length of stay to reach the above functional goals is: 10-14 days 9. Anticipated D/C setting: Home 10. Anticipated post D/C treatments: Yoe therapy 11. Overall Rehab/Functional Prognosis: excellent    RECOMMENDATIONS: This patient's condition is appropriate for continued rehabilitative care in the following setting: CIR Patient has agreed to participate in  recommended program. Yes  Comment: Meredith Staggers, MD, [facility] 04/27/2018  Cleatrice Burke RN MSN 04/27/2018

## 2018-04-27 NOTE — Care Management Note (Signed)
Case Management Note  Patient Details  Name: Meredith Gonzalez MRN: [id number] Date of Birth: [birth year]  Subjective/Objective:                    Action/Plan: Pt discharging to CIR today. CM signing off.   Expected Discharge Date:  04/27/18               Expected Discharge Plan:  [place]  In-House Referral:     Discharge planning Services     Post Acute Care Choice:    Choice offered to:     DME Arranged:    DME Agency:     HH Arranged:    HH Agency:     Status of Service:  Completed, signed off  If discussed at [employer], dates discussed:    Additional Comments:  Pollie Friar, RN 04/27/2018, 11:50 AM

## 2018-04-27 NOTE — Plan of Care (Signed)
Pt met and completed care plan goals.

## 2018-04-27 NOTE — Progress Notes (Signed)
Pt d/c to CIR. Pt is alert and oriented with no new concerns. D/c instructions done with teach back pt verbalize under standing. Report called in to receiving nurse on 4W

## 2018-04-27 NOTE — H&P (Signed)
Physical Medicine and Rehabilitation Admission H&P        Chief Complaint  Patient presents with  . Dizziness  : HPI: Meredith Gonzalez is an 83 year old right handed female history of diet-controlled diabetes mellitus, CAD, CVA in 2000, hypertension, diastolic congestive heart failure, atrial fibrillation with pacemaker maintained on Eliquis. Per chart review patient lives alone at [facility] independent living facility in [place]. Independent with assistive device. She has a son that lives in [place]. Presented 04/24/2018 with left-sided weakness and ataxia. Cranial CT scan negative. Patient did not receive TPA. CT perfusion scan as well as angiogram of head and neck showed moderate to severe 60-70% right carotid bifurcation stenosis. Follow-up renal CT scan to 04/13/2018 negative. MRI not completed due to pacemaker. Echocardiogram with ejection fraction of 16% normal systolic function. Vascular surgery Dr. Donnetta Hutching follow-up for carotid stenosis plan right carotid enterectomy after recovery of CVA to follow-up as outpatient. Patient remains on Eliquis as prior to admission for CVA prophylaxis as well as history of atrial fibrillation. Therapy evaluations completed with recommendations of physical medicine rehabilitation consult. Patient was admitted for a compress of rehabilitation program.   Review of Systems  Constitutional: Negative for chills and fever.  HENT: Negative for hearing loss.   Eyes: Negative for blurred vision and double vision.  Respiratory: Positive for shortness of breath.   Cardiovascular: Positive for palpitations and leg swelling.  Gastrointestinal: Positive for constipation. Negative for nausea and vomiting.  Genitourinary: Negative for dysuria, flank pain and hematuria.       Urinary stress incontinence  Musculoskeletal: Positive for myalgias.  Skin: Negative for rash.  Neurological: Positive for dizziness, focal weakness and  headaches.  Psychiatric/Behavioral: Positive for depression.  All other systems reviewed and are negative.       Past Medical History:  Diagnosis Date  . Anemia    . Anxiety    . Arthritis      "all over" (02/11/2018)  . Basal cell carcinoma      "left leg" (02/11/2018)  . Cervical spine fracture ([place]) 12/2017    "C1-2"  . CHF (congestive heart failure) ([place])    . Chronic bronchitis ([place])    . Chronic neck pain      "since I broke my neck 6-8 wk ago" (02/11/2018)  . Diabetic peripheral neuropathy ([place])    . Fibromyalgia    . Fracture of right humerus    . Generalized weakness    . Headache      "weekly" (02/11/2018)  . History of blood transfusion      "related to one of my femur surgeries" (02/11/2018)  . History of echocardiogram      Echo 8/18: EF 50-55, no RWMA, Gr 1 DD, calcified AV leaflets, MAC, trivial MR, mod LAE, PASP 37  . Hypertension    . Hypothyroidism    . Incontinence of urine    . Ischemic stroke ([place]) 2000    "lost part of the vision in my right eye" (02/11/2018)  . Major depression, chronic    . Myocardial infarction ([place]) 06/2016  . Pacemaker    . Recurrent falls    . Syncope and collapse    . Type 2 diabetes, diet controlled ([place])           Past Surgical History:  Procedure Laterality Date  . ABDOMINAL HYSTERECTOMY      . ANKLE FRACTURE SURGERY Right    . BASAL CELL CARCINOMA EXCISION  Left      "leg"  . CARDIAC CATHETERIZATION   ~ 2014  . CARDIOVERSION N/A 02/13/2018    Procedure: CARDIOVERSION;  Surgeon: Sueanne Margarita, MD;  Location: [facility] ENDOSCOPY;  Service: Cardiovascular;  Laterality: N/A;  . CARDIOVERSION N/A 03/27/2018    Procedure: CARDIOVERSION;  Surgeon: Lelon Perla, MD;  Location: [facility] ENDOSCOPY;  Service: Cardiovascular;  Laterality: N/A;  . CARPAL TUNNEL RELEASE        bilateral  . CATARACT EXTRACTION W/ INTRAOCULAR LENS  IMPLANT, BILATERAL Bilateral    . CHOLECYSTECTOMY OPEN      . COLONOSCOPY      . CORONARY ANGIOPLASTY  WITH STENT PLACEMENT   06/2016  . CORONARY STENT INTERVENTION N/A 07/19/2016    Procedure: Coronary Stent Intervention;  Surgeon: Peter M Martinique, MD;  Location: [place] CV LAB;  Service: Cardiovascular;  Laterality: N/A;  . EYE SURGERY        right eye catarace/lens implant  . FEMUR FRACTURE SURGERY Bilateral    . FRACTURE SURGERY      . GASTRIC BYPASS      . INSERT / REPLACE / REMOVE PACEMAKER   ~ 2012  . LEAD REVISION N/A 01/01/2013    Procedure: LEAD REVISION;  Surgeon: Evans Lance, MD;  Location: [facility] CATH LAB;  Service: Cardiovascular;  Laterality: N/A;  . LEFT HEART CATH AND CORONARY ANGIOGRAPHY N/A 07/19/2016    Procedure: Left Heart Cath and Coronary Angiography;  Surgeon: Peter M Martinique, MD;  Location: [place] CV LAB;  Service: Cardiovascular;  Laterality: N/A;  . OVARIAN CYST SURGERY        "one side only"  . TEE WITHOUT CARDIOVERSION N/A 02/13/2018    Procedure: TRANSESOPHAGEAL ECHOCARDIOGRAM (TEE);  Surgeon: Sueanne Margarita, MD;  Location: [facility] ENDOSCOPY;  Service: Cardiovascular;  Laterality: N/A;  . TONSILLECTOMY        Family History  Problem Relation Age of Onset  . Congestive Heart Failure Mother    . Heart attack Father    . Alcoholism Father    . Alcoholism Sister    . Alcoholism Brother    . Alcoholism Brother    . Throat cancer Brother      Social History:  reports that she quit smoking about 45 years ago. Her smoking use included cigarettes. She has a 2.00 pack-year smoking history. She has never used smokeless tobacco. She reports current alcohol use. She reports that she does not use drugs. Allergies:       Allergies  Allergen Reactions  . Valium [Diazepam] Anxiety      Makes patient hyper Makes patient hyper  . Codeine Hives and Itching      Can take with Benadryl Can take with Benadryl  . Darifenacin Itching      Can take with Benadryl  . Darvon [Propoxyphene Hcl] Itching      Can take with Benadryl  . Daypro [Oxaprozin] Itching      Can  take with Benadryl Can take with Benadryl  . Enablex [Darifenacin Hydrobromide Er] Itching      Can take with Benadryl  . Oxycodone Itching      Can take with Benadryl Can take with Benadryl Can take with Benadryl Can take with Benadryl  . Propoxyphene Itching      Can take with Benadryl  . Risperdal [Risperidone] Itching      Can take with Benadryl Can take with Benadryl  . Talwin [Pentazocine] Itching      Can  take with Benadryl Can take with Benadryl  . Vicodin [Hydrocodone-Acetaminophen] Itching      Can take with Benadryl Can take with Benadryl          Medications Prior to Admission  Medication Sig Dispense Refill  . acetaminophen (TYLENOL) 500 MG tablet Take 500 mg by mouth every 6 (six) hours as needed for moderate pain.       Marland Kitchen albuterol (PROVENTIL HFA;VENTOLIN HFA) 108 (90 Base) MCG/ACT inhaler Inhale 2 puffs into the lungs every 6 (six) hours as needed for wheezing or shortness of breath.       Marland Kitchen amiodarone (PACERONE) 200 MG tablet Take 1 tablet (200 mg total) by mouth every morning. 90 tablet 3  . ammonium lactate (LAC-HYDRIN) 12 % lotion Apply 1 application topically daily. In the morning after bathing   0  . apixaban (ELIQUIS) 5 MG TABS tablet Take 1 tablet (5 mg total) by mouth 2 (two) times daily. Prescription for free 30 day supply 60 tablet 0  . atorvastatin (LIPITOR) 40 MG tablet Take 1 tablet (40 mg total) by mouth daily. 90 tablet 3  . Calcium Citrate-Vitamin D3 315-250 MG-UNIT TABS Take 1 tablet by mouth 2 (two) times daily.      . diphenhydrAMINE (BENADRYL ALLERGY) 25 MG tablet Take 25 mg by mouth every 8 (eight) hours as needed (itching/allergies.).       Marland Kitchen ferrous sulfate 325 (65 FE) MG tablet Take 1 tablet (325 mg total) by mouth 2 (two) times daily with a meal. (Patient taking differently: Take 325 mg by mouth daily with breakfast. ) 60 tablet 0  . flintstones complete (FLINTSTONES) 60 MG chewable tablet Chew 1 tablet by mouth daily.      . fluticasone  (FLONASE) 50 MCG/ACT nasal spray Place 1 spray into both nostrils daily as needed (allergies.).    4  . furosemide (LASIX) 20 MG tablet Take 20 mg by mouth daily.      . furosemide (LASIX) 40 MG tablet Take 40 mg by mouth daily.      Marland Kitchen gabapentin (NEURONTIN) 300 MG capsule Take 300-600 mg by mouth See admin instructions. Take 1 capsule (300 mg) by mouth in the mornning & take 2 capsules (600 mg) by mouth at night      . Melatonin 10 MG TABS Take 40 mg by mouth at bedtime.      . Multiple Vitamins-Minerals (MULTIVITAMIN ADULT) TABS Take 1 tablet by mouth daily.      . potassium chloride (K-DUR) 10 MEQ tablet Take 2 tablets (20 mEq total) by mouth daily. 90 tablet 3  . pramipexole (MIRAPEX) 0.25 MG tablet Take 0.25 mg by mouth daily as needed (for mouth blisters).       . vitamin B-12 (CYANOCOBALAMIN) 1000 MCG tablet Take 1 tablet (1,000 mcg total) by mouth daily. 30 tablet 0  . nitroGLYCERIN (NITROSTAT) 0.4 MG SL tablet Place 1 tablet (0.4 mg total) under the tongue every 5 (five) minutes as needed for chest pain. 25 tablet 2  . pindolol (VISKEN) 5 MG tablet Take 10 mg in the AM and 5 mg in the PM daily by mouth 270 tablet 3      Drug Regimen Review Drug regimen was reviewed and remains appropriate with no significant issues identified   Home: Home Living Family/patient expects to be discharged to:: Private residence Living Arrangements: Alone Available Help at Discharge: Family, Available PRN/intermittently Type of Home: House Home Access: Level entry Home Layout: One level Bathroom Shower/Tub:  Walk-in shower Home Equipment: Environmental consultant - 4 wheels, Environmental consultant - 2 wheels, Bedside commode, Shower seat - built in, [employer] - tub/shower Additional Comments: [facility]"  Lives With: Alone   Functional History: Prior Function Level of Independence: Independent with assistive device(s) Comments: ADLs and light IADLs. driving. Son assists with heavier IADL including  vaccuuming. Pt uses RW as she feels she needs.   Functional Status:  Mobility: Bed Mobility Overal bed mobility: Needs Assistance Bed Mobility: Supine to Sit Supine to sit: Min guard, HOB elevated Sit to supine: Min guard General bed mobility comments: Min Guard A for safety Transfers Overall transfer level: Needs assistance Equipment used: Rolling walker (2 wheeled) Transfers: Sit to/from Stand Sit to Stand: Min guard, Min assist General transfer comment: Min A for steadying in standing Ambulation/Gait Ambulation/Gait assistance: Mod assist, Max assist Gait Distance (Feet): 50 Feet Assistive device: Rolling walker (2 wheeled) Gait Pattern/deviations: Step-to pattern, Step-through pattern, Ataxic, Narrow base of support, Decreased stride length General Gait Details: multiple LOB max A to recover; cues for wider BOS as R foot crossing over in middle and ataxic L foot placement.  assist for walker management and cues for eyes on stationary target as dizzy and with double vision   ADL: ADL Overall ADL's : Needs assistance/impaired Eating/Feeding: Set up, Supervision/ safety, Sitting Grooming: Minimal assistance, Min guard, Brushing hair, Oral care, Standing Grooming Details (indicate cue type and reason): Pt requiring MIn Guard A -Min A for balance and safety at sink. Pt with decreased FM skills, grasp strength, depth perception, and vision. Pt overshooting when reaching out for faucet or grooming items Upper Body Bathing: Minimal assistance, Sitting Lower Body Bathing: Minimal assistance, Sit to/from stand Upper Body Dressing : Minimal assistance, Sitting Upper Body Dressing Details (indicate cue type and reason): doning house coat with Min A. Doffed with Min Guard A while seated at EOB Lower Body Dressing: Minimal assistance, Sit to/from stand Lower Body Dressing Details (indicate cue type and reason): Pt able to bend forward to adjut socks Toilet Transfer: Minimal assistance,  Ambulation, RW(simulated in room) Functional mobility during ADLs: Minimal assistance, Min guard, Rolling walker General ADL Comments: Pt demonstrating progress. Continues to present with decreased balance, strength, and vision.    Cognition: Cognition Overall Cognitive Status: Impaired/Different from baseline Arousal/Alertness: Awake/alert Orientation Level: Oriented X4 Memory: Impaired Memory Impairment: Retrieval deficit, Decreased recall of new information, Decreased short term memory Decreased Short Term Memory: Verbal basic, Verbal complex Awareness: Appears intact Problem Solving: Appears intact Executive Function: Reasoning Reasoning: Impaired Reasoning Impairment: Verbal complex, Functional complex Behaviors: (anxious) Safety/Judgment: Appears intact Cognition Arousal/Alertness: Awake/alert Behavior During Therapy: WFL for tasks assessed/performed Overall Cognitive Status: Impaired/Different from baseline Area of Impairment: Safety/judgement, Attention Current Attention Level: Sustained Safety/Judgement: Decreased awareness of deficits, Decreased awareness of safety General Comments: Pt requiring increased time throughout and present with some difficulty processing and recalling words. Pt motivated to participate in therapy.    Physical Exam: Blood pressure (!) 158/69, pulse 70, temperature 98 F (36.7 C), temperature source Oral, resp. rate 18, height 5' 5"  (1.651 m), weight 59.4 kg, SpO2 96 %. Physical Exam  Constitutional: She appears well-developed and well-nourished.  HENT:  Head: Normocephalic and atraumatic.  Eyes: EOM are normal.  Neck: Normal range of motion. No tracheal deviation present. No thyromegaly present.  Cardiovascular: Normal rate and regular rhythm. Exam reveals no friction rub.  No murmur heard. Respiratory: Effort normal. No respiratory distress. She has no wheezes. She has no  rales.  GI: Soft. She exhibits no distension. There is no abdominal  tenderness.  Neurological:  Patient is alert and pleasant. Makes good eye contact with examiner follows full commands. She provides her name and age as well as date of birth. Fair awareness of deficits.She did have some difficulty with higher level executive processing. Mild left central 7. Speech clear. RUE 4/5. LUE 4- to 4/5. RLE 3+ to 4/5. LLE 3+ to 4-/5. No focal sensory findings.   Psychiatric: She has a normal mood and affect. Her behavior is normal.      Lab Results Last 48 Hours        Results for orders placed or performed during the hospital encounter of 04/24/18 (from the past 48 hour(s))  Vitamin B12     Status: None    Collection Time: 04/26/18  5:29 AM  Result Value Ref Range    Vitamin B-12 264 180 - 914 pg/mL      Comment: (NOTE) This assay is not validated for testing neonatal or myeloproliferative syndrome specimens for Vitamin B12 levels. Performed at [facility], [place] [address] [place], [place] [address]    CBC     Status: Abnormal    Collection Time: 04/26/18  5:29 AM  Result Value Ref Range    WBC 6.5 4.0 - 10.5 K/uL    RBC 3.49 (L) 3.87 - 5.11 MIL/uL    Hemoglobin 9.7 (L) 12.0 - 15.0 g/dL    HCT 33.9 (L) 36.0 - 46.0 %    MCV 97.1 80.0 - 100.0 fL    MCH 27.8 26.0 - 34.0 pg    MCHC 28.6 (L) 30.0 - 36.0 g/dL    RDW 16.2 (H) 11.5 - 15.5 %    Platelets 185 150 - 400 K/uL    nRBC 0.0 0.0 - 0.2 %      Comment: Performed at [facility], [place] [address], [place], [place] [address]  Basic metabolic panel     Status: Abnormal    Collection Time: 04/26/18  5:29 AM  Result Value Ref Range    Sodium 140 135 - 145 mmol/L    Potassium 3.7 3.5 - 5.1 mmol/L    Chloride 108 98 - 111 mmol/L    CO2 22 22 - 32 mmol/L    Glucose, Bld 151 (H) 70 - 99 mg/dL    BUN 21 8 - 23 mg/dL    Creatinine, Ser 0.96 0.44 - 1.00 mg/dL    Calcium 7.9 (L) 8.9 - 10.3 mg/dL    GFR calc non Af Amer 56 (L) >60 mL/min    GFR calc Af Amer >60 >60 mL/min    Anion gap 10 5 - 15       Comment: Performed at [facility] [address] [place], [place] [address]      Imaging Results (Last 48 hours)  No results found.           Medical Problem List and Plan: 1.  Ataxic gait with left-sided weakness secondary to subcortical/brainstem CVA. MRI not completed due to pacemaker             -admit to inpatient rehab             -plan for follow up with vascular surgery as outpt after discharge to discuss potential CEA 2.  DVT Prophylaxis/Anticoagulation: Eliquis 3. Pain Management:  Neurontin 300 mg daily at 600 mg daily at bedtime 4. Mood:  Provide emotional support  5. Neuropsych: This patient is capable of making decisions on her own behalf. 6. Skin/Wound Care:  Routine skin checks 7. Fluids/Electrolytes/Nutrition:  Routine in and out's with follow-up chemistries 8. Atrial fibrillation with history of pacemaker. Continue amiodarone 200 mg daily.             -heart rate regular on eval 9. Diastolic congestive heart failure. Monitor for any signs of fluid overload 10. Hypothyroidism. Synthroid 11. Hyperlipidemia. Lipitor 12. Diet controlled diabetes mellitus. Blood sugar checks discontinued as sugars well controlled 13. Constipation. Laxative assistance   Post Admission Physician Evaluation: 1. Functional deficits secondary  to right subcortical infarct. 2. Patient is admitted to receive collaborative, interdisciplinary care between the physiatrist, rehab nursing staff, and therapy team. 3. Patient's level of medical complexity and substantial therapy needs in context of that medical necessity cannot be provided at a lesser intensity of care such as a SNF. 4. Patient has experienced substantial functional loss from his/her baseline which was documented above under the "Functional History" and "Functional Status" headings.  Judging by the patient's diagnosis, physical exam, and functional history, the patient has potential for functional progress which will result in  measurable gains while on inpatient rehab.  These gains will be of substantial and practical use upon discharge  in facilitating mobility and self-care at the household level. 5. Physiatrist will provide 24 hour management of medical needs as well as oversight of the therapy plan/treatment and provide guidance as appropriate regarding the interaction of the two. 6. The Preadmission Screening has been reviewed and patient status is unchanged unless otherwise stated above. 7. 24 hour rehab nursing will assist with bladder management, bowel management, safety, skin/wound care, disease management, medication administration, pain management and patient education  and help integrate therapy concepts, techniques,education, etc. 8. PT will assess and treat for/with: Lower extremity strength, range of motion, stamina, balance, functional mobility, safety, adaptive techniques and equipment, NMR, community reentry.   Goals are: mod I. 9. OT will assess and treat for/with: ADL's, functional mobility, safety, upper extremity strength, adaptive techniques and equipment, NMR, community reentry.   Goals are: mod I. Therapy may proceed with showering this patient. 10. SLP will assess and treat for/with: cognition, communication.  Goals are: mod I. 11. Case Management and Social Worker will assess and treat for psychological issues and discharge planning. 12. Team conference will be held weekly to assess progress toward goals and to determine barriers to discharge. 13. Patient will receive at least 3 hours of therapy per day at least 5 days per week. 14. ELOS: 10-14 days       15. Prognosis:  excellent     I have personally performed a face to face diagnostic evaluation of this patient and formulated the key components of the plan.  Additionally, I have personally reviewed laboratory data, imaging studies, as well as relevant notes and concur with the physician assistant's documentation above.   Meredith Staggers, MD,  Mellody Drown     Lavon Paganini [place], PA-C 04/27/2018'    The patient's status has not changed. The original post admission physician evaluation remains appropriate, and any changes from the pre-admission screening or documentation from the acute chart are noted above.  Meredith Staggers, MD 04/27/2018

## 2018-04-27 NOTE — Discharge Summary (Signed)
Physician Discharge Summary  Meredith Gonzalez RXV:[id number] DOB: 1935-08-15 DOA: 04/24/2018  PCP: Sinclair Ship, MD  Admit date: 04/24/2018 Discharge date: 04/27/2018  Admitted From: [facility] Disposition: CIR  Recommendations for Outpatient Follow-up:  1. Follow up with PCP in 1-2 weeks 2. Please obtain BMP/CBC in one week your next doctors visit.  3. Continue Eliquis daily.  Not on aspirin or Plavix per neurology. 4. Follow-up outpatient neurology in 3-4 weeks 5. Synthroid 50 mcg daily started  Home Health: None Equipment/Devices: None Discharge Condition: Stable CODE STATUS: DNR Diet recommendation: Cardiac  Brief/Interim Summary: 83 year old female with history of CVA, Hypothyroidism, diabetes mellitus type 2, coronary artery disease, CHF with preserved ejection fraction, pacemaker in place came to the hospital for evaluation of chest discomfort and left arm tingling.  CT of the head was negative for any acute abnormality, CTA of the head and neck showed eccentric-looking plaque and stenosis of the right ICA.  Unable to get MRI due to her pacemaker.  Vascular surgery was consulted who recommended outpatient follow-up for right carotid endarterectomy at some point.  Neurology recommended continuing Eliquis but no aspirin or Plavix. At this point patient remains in benefit from hospital stay and stable for discharge with outpatient follow-up recommendations as stated above.  Patient will be getting discharged to CIR   Discharge Diagnoses:  Active Problems:   Persistent atrial fibrillation   Hypertension   Hypothyroidism   Diabetes ([place])   Pacemaker   CAD (coronary artery disease)   (HFpEF) heart failure with preserved ejection fraction (HCC)   CVA (cerebral vascular accident) ([place])  Left-sided arm weakness and tingling sensation concerning for posterior circulation stroke - CTA showed stenosis in the plaque of the right ICA.  Vascular surgery suggested outpatient follow-up for possible CEA  in the future once she is medically more stable -Unable to get MRI due to her pacemaker -Continue Eliquis and statin -Appreciate neurology input.  Essential hypertension -Resume home meds.  Hyperlipidemia -Continue statin  Hypothyroidism - Synthroid started 50 mcg daily.  Follow-up outpatient  Diabetes mellitus type 2 -Currently diet controlled.  Hemoglobin A1c 6.3.  Coronary artery disease -Currently patient is chest pain-free.  No issues at this time.  Continue home medications.  Patient is DNR Discharge today to CIR.  Discharge Instructions   Allergies as of 04/27/2018      Reactions   Valium [diazepam] Anxiety   Makes patient hyper Makes patient hyper   Codeine Hives, Itching   Can take with Benadryl Can take with Benadryl   Darifenacin Itching   Can take with Benadryl   Darvon [propoxyphene Hcl] Itching   Can take with Benadryl   Daypro [oxaprozin] Itching   Can take with Benadryl Can take with Benadryl   Enablex [darifenacin Hydrobromide Er] Itching   Can take with Benadryl   Oxycodone Itching   Can take with Benadryl Can take with Benadryl Can take with Benadryl Can take with Benadryl   Propoxyphene Itching   Can take with Benadryl   Risperdal [risperidone] Itching   Can take with Benadryl Can take with Benadryl   Talwin [pentazocine] Itching   Can take with Benadryl Can take with Benadryl   Vicodin [hydrocodone-acetaminophen] Itching   Can take with Benadryl Can take with Benadryl      Medication List    TAKE these medications   acetaminophen 500 MG tablet Commonly known as:  TYLENOL Take 500 mg by mouth every 6 (six) hours as needed for moderate pain.   albuterol 108 (  90 Base) MCG/ACT inhaler Commonly known as:  PROVENTIL HFA;VENTOLIN HFA Inhale 2 puffs into the lungs every 6 (six) hours as needed for wheezing or shortness of breath.   amiodarone 200 MG tablet Commonly known as:  PACERONE Take 1 tablet (200 mg total) by mouth every  morning.   ammonium lactate 12 % lotion Commonly known as:  LAC-HYDRIN Apply 1 application topically daily. In the morning after bathing   apixaban 5 MG Tabs tablet Commonly known as:  ELIQUIS Take 1 tablet (5 mg total) by mouth 2 (two) times daily. Prescription for free 30 day supply   atorvastatin 40 MG tablet Commonly known as:  LIPITOR Take 1 tablet (40 mg total) by mouth daily.   BENADRYL ALLERGY 25 MG tablet Generic drug:  diphenhydrAMINE Take 25 mg by mouth every 8 (eight) hours as needed (itching/allergies.).   Calcium Citrate-Vitamin D3 315-250 MG-UNIT Tabs Take 1 tablet by mouth 2 (two) times daily.   ferrous sulfate 325 (65 FE) MG tablet Take 1 tablet (325 mg total) by mouth 2 (two) times daily with a meal. What changed:  when to take this   flintstones complete 60 MG chewable tablet Chew 1 tablet by mouth daily.   fluticasone 50 MCG/ACT nasal spray Commonly known as:  FLONASE Place 1 spray into both nostrils daily as needed (allergies.).   gabapentin 300 MG capsule Commonly known as:  NEURONTIN Take 300-600 mg by mouth See admin instructions. Take 1 capsule (300 mg) by mouth in the mornning & take 2 capsules (600 mg) by mouth at night   LASIX 40 MG tablet Generic drug:  furosemide Take 40 mg by mouth daily.   furosemide 20 MG tablet Commonly known as:  LASIX Take 20 mg by mouth daily.   levothyroxine 50 MCG tablet Commonly known as:  SYNTHROID, LEVOTHROID Take 1 tablet (50 mcg total) by mouth daily at 6 (six) AM for 30 days. Start taking on:  April 28, 2018   Melatonin 10 MG Tabs Take 40 mg by mouth at bedtime.   MULTIVITAMIN ADULT Tabs Take 1 tablet by mouth daily.   nitroGLYCERIN 0.4 MG SL tablet Commonly known as:  NITROSTAT Place 1 tablet (0.4 mg total) under the tongue every 5 (five) minutes as needed for chest pain.   pindolol 5 MG tablet Commonly known as:  VISKEN Take 10 mg in the AM and 5 mg in the PM daily by mouth   potassium  chloride 10 MEQ tablet Commonly known as:  K-DUR Take 2 tablets (20 mEq total) by mouth daily.   pramipexole 0.25 MG tablet Commonly known as:  MIRAPEX Take 0.25 mg by mouth daily as needed (for mouth blisters).   vitamin B-12 1000 MCG tablet Commonly known as:  CYANOCOBALAMIN Take 1 tablet (1,000 mcg total) by mouth daily.      Follow-up Information    Sinclair Ship, MD. Schedule an appointment as soon as possible for a visit in 1 week(s).   Specialty:  Internal Medicine Contact information: [address] [place] [address] [phone]        Fay Records, MD .   Specialty:  Cardiology Contact information: [place] [address] [place] [id number] [phone]        Evans Lance, MD .   Specialty:  Cardiology Contact information: [phone] [place] [address] [phone]        Garvin Fila, MD. Schedule an appointment as soon  as possible for a visit in 3 week(s).   Specialties:  Neurology, Radiology Why:  3-4 weeks  Contact information: [address] [place] [address] [phone]          Allergies  Allergen Reactions  . Valium [Diazepam] Anxiety    Makes patient hyper Makes patient hyper  . Codeine Hives and Itching    Can take with Benadryl Can take with Benadryl  . Darifenacin Itching    Can take with Benadryl  . Darvon [Propoxyphene Hcl] Itching    Can take with Benadryl  . Daypro [Oxaprozin] Itching    Can take with Benadryl Can take with Benadryl  . Enablex [Darifenacin Hydrobromide Er] Itching    Can take with Benadryl  . Oxycodone Itching    Can take with Benadryl Can take with Benadryl Can take with Benadryl Can take with Benadryl  . Propoxyphene Itching    Can take with Benadryl  . Risperdal [Risperidone] Itching    Can take with Benadryl Can take with Benadryl  . Talwin [Pentazocine] Itching    Can take with Benadryl Can take with Benadryl   . Vicodin [Hydrocodone-Acetaminophen] Itching    Can take with Benadryl Can take with Benadryl    You were cared for by a hospitalist during your hospital stay. If you have any questions about your discharge medications or the care you received while you were in the hospital after you are discharged, you can call the unit and asked to speak with the hospitalist on call if the hospitalist that took care of you is not available. Once you are discharged, your primary care physician will handle any further medical issues. Please note that no refills for any discharge medications will be authorized once you are discharged, as it is imperative that you return to your primary care physician (or establish a relationship with a primary care physician if you do not have one) for your aftercare needs so that they can reassess your need for medications and monitor your lab values.  Consultations:  Vascular surgery  Neurology   Procedures/Studies: Ct Angio Head W Or Wo Contrast  Result Date: 04/24/2018 CLINICAL DATA:  Left-sided weakness and ataxia for 2 days. Evaluate for posterior circulation stroke. EXAM: CT ANGIOGRAPHY HEAD AND NECK CT PERFUSION BRAIN TECHNIQUE: Multidetector CT imaging of the head and neck was performed using the standard protocol during bolus administration of intravenous contrast. Multiplanar CT image reconstructions and MIPs were obtained to evaluate the vascular anatomy. Carotid stenosis measurements (when applicable) are obtained utilizing NASCET criteria, using the distal internal carotid diameter as the denominator. Multiphase CT imaging of the brain was performed following IV bolus contrast injection. Subsequent parametric perfusion maps were calculated using RAPID software. CONTRAST:  60mL ISOVUE-370 IOPAMIDOL (ISOVUE-370) INJECTION 76% COMPARISON:  Head CT from earlier today FINDINGS: CTA NECK FINDINGS Aortic arch: Atherosclerotic plaque.  3 vessel branching. Right carotid  system: Extensive atheromatous plaque at the common carotid bifurcation and ICA bulb. Bulb stenosis measures 60%. No definite ulceration or downstream beading. High-grade ECA origin stenosis. Left carotid system: Moderate calcified plaque at the bifurcation without flow limiting stenosis or ulceration. Vertebral arteries: No proximal subclavian stenosis. Mild left vertebral artery dominance. Both vertebral arteries are widely patent to the dura. Skeleton: Cervical spine degeneration. Remote C1 ring and dens fractures . No bony union at these fractures, but unchanged alignment. Other neck: No acute finding Upper chest: Bronchomalacia with severe of the bronchus intermedius at time of imaging. Streaky opacity  in the bilateral upper lungs with air trapping. Review of the MIP images confirms the above findings CTA HEAD FINDINGS Anterior circulation: Atherosclerotic calcification on the carotid siphons. No branch occlusion or flow limiting stenosis. Negative for aneurysm. Posterior circulation: Left dominant vertebral artery. The vertebral and basilar arteries are smooth and widely patent. No branch occlusion or flow limiting stenosis. Negative for aneurysm Venous sinuses: Prominent enhancement within the distal transverse sinuses but no evident fistula or other shunt. Anatomic variants: None significant Delayed phase: Not obtained in the emergent setting Review of the MIP images confirms the above findings CT Brain Perfusion Findings: CBF (<30%) Volume: 51mL Perfusion (Tmax>6.0s) volume: 73mL IMPRESSION: 1. No emergent large vessel occlusion or infarct/ischemia by CT perfusion. 2. 60% narrowing of the right ICA bulb due to bulky atherosclerotic plaque. 3. Bronchomalacia and advanced right bronchus intermedius airway narrowing with biapical lung opacity at least partially from atelectasis. Electronically Signed   By: Monte Fantasia M.D.   On: 04/24/2018 09:17   Ct Head Wo Contrast  Result Date: 04/25/2018 CLINICAL  DATA:  Ataxia, stroke suspected. EXAM: CT HEAD WITHOUT CONTRAST TECHNIQUE: Contiguous axial images were obtained from the base of the skull through the vertex without intravenous contrast. COMPARISON:  Prior imaging 04/24/2018 was negative for large vessel occlusion, or infarct/ischemia. FINDINGS: Brain: No evidence for acute infarction, hemorrhage, mass lesion, hydrocephalus, or extra-axial fluid. Generalized atrophy. Chronic microvascular ischemic change. Incidental RIGHT parietal sulcal calcification. Vascular: Calcification of the cavernous internal carotid arteries consistent with cerebrovascular atherosclerotic disease. No signs of intracranial large vessel occlusion. Skull: Calvarium intact. Sinuses/Orbits: No acute findings. Other: None. IMPRESSION: Atrophy and small vessel disease. No acute intracranial findings. Stable appearance compared with yesterday's imaging. Electronically Signed   By: Staci Righter M.D.   On: 04/25/2018 11:05   Ct Head Wo Contrast  Result Date: 04/24/2018 CLINICAL DATA:  Focal neuro deficit.  Headache. EXAM: CT HEAD WITHOUT CONTRAST TECHNIQUE: Contiguous axial images were obtained from the base of the skull through the vertex without intravenous contrast. COMPARISON:  12/28/2017 FINDINGS: Brain: No evidence of acute infarction, hemorrhage, hydrocephalus, extra-axial collection or mass lesion/mass effect. Stable small sulcal calcification along the right parietal convexity, incidental in isolation. Moderate chronic small vessel ischemia in the cerebral white matter. Mild for age volume loss. Vascular: Atherosclerotic calcification Skull: No acute or aggressive finding. Prominent right atlantooccipital degenerative spurring. Prior C1 ring fracture that is not covered on this study. Sinuses/Orbits: Negative IMPRESSION: No acute finding or change from 2019. Electronically Signed   By: Monte Fantasia M.D.   On: 04/24/2018 06:23   Ct Angio Neck W And/or Wo Contrast  Result Date:  04/24/2018 CLINICAL DATA:  Left-sided weakness and ataxia for 2 days. Evaluate for posterior circulation stroke. EXAM: CT ANGIOGRAPHY HEAD AND NECK CT PERFUSION BRAIN TECHNIQUE: Multidetector CT imaging of the head and neck was performed using the standard protocol during bolus administration of intravenous contrast. Multiplanar CT image reconstructions and MIPs were obtained to evaluate the vascular anatomy. Carotid stenosis measurements (when applicable) are obtained utilizing NASCET criteria, using the distal internal carotid diameter as the denominator. Multiphase CT imaging of the brain was performed following IV bolus contrast injection. Subsequent parametric perfusion maps were calculated using RAPID software. CONTRAST:  33mL ISOVUE-370 IOPAMIDOL (ISOVUE-370) INJECTION 76% COMPARISON:  Head CT from earlier today FINDINGS: CTA NECK FINDINGS Aortic arch: Atherosclerotic plaque.  3 vessel branching. Right carotid system: Extensive atheromatous plaque at the common carotid bifurcation and ICA bulb. Bulb stenosis measures 60%.  No definite ulceration or downstream beading. High-grade ECA origin stenosis. Left carotid system: Moderate calcified plaque at the bifurcation without flow limiting stenosis or ulceration. Vertebral arteries: No proximal subclavian stenosis. Mild left vertebral artery dominance. Both vertebral arteries are widely patent to the dura. Skeleton: Cervical spine degeneration. Remote C1 ring and dens fractures . No bony union at these fractures, but unchanged alignment. Other neck: No acute finding Upper chest: Bronchomalacia with severe of the bronchus intermedius at time of imaging. Streaky opacity in the bilateral upper lungs with air trapping. Review of the MIP images confirms the above findings CTA HEAD FINDINGS Anterior circulation: Atherosclerotic calcification on the carotid siphons. No branch occlusion or flow limiting stenosis. Negative for aneurysm. Posterior circulation: Left dominant  vertebral artery. The vertebral and basilar arteries are smooth and widely patent. No branch occlusion or flow limiting stenosis. Negative for aneurysm Venous sinuses: Prominent enhancement within the distal transverse sinuses but no evident fistula or other shunt. Anatomic variants: None significant Delayed phase: Not obtained in the emergent setting Review of the MIP images confirms the above findings CT Brain Perfusion Findings: CBF (<30%) Volume: 68mL Perfusion (Tmax>6.0s) volume: 34mL IMPRESSION: 1. No emergent large vessel occlusion or infarct/ischemia by CT perfusion. 2. 60% narrowing of the right ICA bulb due to bulky atherosclerotic plaque. 3. Bronchomalacia and advanced right bronchus intermedius airway narrowing with biapical lung opacity at least partially from atelectasis. Electronically Signed   By: Monte Fantasia M.D.   On: 04/24/2018 09:17   Ct Cerebral Perfusion W Contrast  Result Date: 04/24/2018 CLINICAL DATA:  Left-sided weakness and ataxia for 2 days. Evaluate for posterior circulation stroke. EXAM: CT ANGIOGRAPHY HEAD AND NECK CT PERFUSION BRAIN TECHNIQUE: Multidetector CT imaging of the head and neck was performed using the standard protocol during bolus administration of intravenous contrast. Multiplanar CT image reconstructions and MIPs were obtained to evaluate the vascular anatomy. Carotid stenosis measurements (when applicable) are obtained utilizing NASCET criteria, using the distal internal carotid diameter as the denominator. Multiphase CT imaging of the brain was performed following IV bolus contrast injection. Subsequent parametric perfusion maps were calculated using RAPID software. CONTRAST:  55mL ISOVUE-370 IOPAMIDOL (ISOVUE-370) INJECTION 76% COMPARISON:  Head CT from earlier today FINDINGS: CTA NECK FINDINGS Aortic arch: Atherosclerotic plaque.  3 vessel branching. Right carotid system: Extensive atheromatous plaque at the common carotid bifurcation and ICA bulb. Bulb  stenosis measures 60%. No definite ulceration or downstream beading. High-grade ECA origin stenosis. Left carotid system: Moderate calcified plaque at the bifurcation without flow limiting stenosis or ulceration. Vertebral arteries: No proximal subclavian stenosis. Mild left vertebral artery dominance. Both vertebral arteries are widely patent to the dura. Skeleton: Cervical spine degeneration. Remote C1 ring and dens fractures . No bony union at these fractures, but unchanged alignment. Other neck: No acute finding Upper chest: Bronchomalacia with severe of the bronchus intermedius at time of imaging. Streaky opacity in the bilateral upper lungs with air trapping. Review of the MIP images confirms the above findings CTA HEAD FINDINGS Anterior circulation: Atherosclerotic calcification on the carotid siphons. No branch occlusion or flow limiting stenosis. Negative for aneurysm. Posterior circulation: Left dominant vertebral artery. The vertebral and basilar arteries are smooth and widely patent. No branch occlusion or flow limiting stenosis. Negative for aneurysm Venous sinuses: Prominent enhancement within the distal transverse sinuses but no evident fistula or other shunt. Anatomic variants: None significant Delayed phase: Not obtained in the emergent setting Review of the MIP images confirms the above findings CT Brain  Perfusion Findings: CBF (<30%) Volume: 57mL Perfusion (Tmax>6.0s) volume: 25mL IMPRESSION: 1. No emergent large vessel occlusion or infarct/ischemia by CT perfusion. 2. 60% narrowing of the right ICA bulb due to bulky atherosclerotic plaque. 3. Bronchomalacia and advanced right bronchus intermedius airway narrowing with biapical lung opacity at least partially from atelectasis. Electronically Signed   By: Monte Fantasia M.D.   On: 04/24/2018 09:17   Vas US Carotid (at [place] Only)  Result Date: 04/24/2018 Carotid Arterial Duplex Study Indications: TIA. Performing Technologist: Toma Copier RVS  Examination Guidelines: A complete evaluation includes B-mode imaging, spectral Doppler, color Doppler, and power Doppler as needed of all accessible portions of each vessel. Bilateral testing is considered an integral part of a complete examination. Limited examinations for reoccurring indications may be performed as noted.  Right Carotid Findings: +----------+--------+--------+--------+--------------------+-------------------+           PSV cm/sEDV cm/sStenosisDescribe            Comments            +----------+--------+--------+--------+--------------------+-------------------+ CCA Prox  75      8                                                       +----------+--------+--------+--------+--------------------+-------------------+ CCA Distal71      11                                  minimal intimal                                                           wall changes        +----------+--------+--------+--------+--------------------+-------------------+ ICA Prox  132     27      1-39%   heterogenous and                                                          irregular                               +----------+--------+--------+--------+--------------------+-------------------+ ICA Mid   144     32                                  intimal thickening  +----------+--------+--------+--------+--------------------+-------------------+ ICA Distal128     24                                                      +----------+--------+--------+--------+--------------------+-------------------+ ECA       173     4  heterogenous        At the origin with                                                        acoustic shadowing  +----------+--------+--------+--------+--------------------+-------------------+ +----------+--------+-------+--------+-------------------+           PSV cm/sEDV cmsDescribeArm Pressure (mmHG)  +----------+--------+-------+--------+-------------------+ [id number]                                         +----------+--------+-------+--------+-------------------+ +---------+--------+--+--------+-+ VertebralPSV cm/s49EDV cm/s7 +---------+--------+--+--------+-+  Left Carotid Findings: +----------+--------+--------+--------+------------+-------------------------+           PSV cm/sEDV cm/sStenosisDescribe    Comments                  +----------+--------+--------+--------+------------+-------------------------+ CCA Prox  147     21                          mild intimal wall changes +----------+--------+--------+--------+------------+-------------------------+ CCA Distal102     14                          mild intimal changes      +----------+--------+--------+--------+------------+-------------------------+ ICA Prox  58      11              heterogenousmild plaque               +----------+--------+--------+--------+------------+-------------------------+ ICA Mid   106     16                                                    +----------+--------+--------+--------+------------+-------------------------+ ICA Distal73      19                                                    +----------+--------+--------+--------+------------+-------------------------+ ECA       154     14              heterogenousmild plaque               +----------+--------+--------+--------+------------+-------------------------+ +----------+--------+--------+--------+-------------------+ SubclavianPSV cm/sEDV cm/sDescribeArm Pressure (mmHG) +----------+--------+--------+--------+-------------------+           137                                         +----------+--------+--------+--------+-------------------+ +---------+--------+--+--------+--+ VertebralPSV cm/s68EDV cm/s17 +---------+--------+--+--------+--+  Summary: Right Carotid: Velocities in the right  ICA are consistent with a 1-39% stenosis.                Velocity and plaque may be upper end of scale. Left Carotid: Velocities in the left ICA are consistent with a 1-39% stenosis. Vertebrals:  Bilateral vertebral arteries demonstrate antegrade flow. Subclavians: Normal flow hemodynamics were seen in bilateral subclavian  arteries. *See table(s) above for measurements and observations.  Electronically signed by Curt Jews MD on 04/24/2018 at 4:51:49 PM.    Final       Subjective: No new complaints at this time.   Discharge Exam: Vitals:   04/27/18 0326 04/27/18 0758  BP: 134/60 (!) 158/69  Pulse: 66 70  Resp: 18 18  Temp: 98 F (36.7 C) 98 F (36.7 C)  SpO2: 95% 96%   Vitals:   04/26/18 2326 04/26/18 2328 04/27/18 0326 04/27/18 0758  BP: (!) 121/49 (!) 121/49 134/60 (!) 158/69  Pulse: 68 68 66 70  Resp: 17 18 18 18   Temp: 97.9 F (36.6 C) 97.9 F (36.6 C) 98 F (36.7 C) 98 F (36.7 C)  TempSrc: Oral Oral Oral Oral  SpO2: 97% 97% 95% 96%  Weight:      Height:        General: Pt is alert, awake, not in acute distress Cardiovascular: RRR, S1/S2 +, no rubs, no gallops Respiratory: CTA bilaterally, no wheezing, no rhonchi Abdominal: Soft, NT, ND, bowel sounds + Extremities: no edema, no cyanosis    The results of significant diagnostics from this hospitalization (including imaging, microbiology, ancillary and laboratory) are listed below for reference.     Microbiology: No results found for this or any previous visit (from the past 240 hour(s)).   Labs: BNP (last 3 results) Recent Labs    02/10/18 1519  BNP 379.0*   Basic Metabolic Panel: Recent Labs  Lab 04/24/18 0545 04/26/18 0529  NA 137 140  K 3.6 3.7  CL 103 108  CO2 28 22  GLUCOSE 116* 151*  BUN 34* 21  CREATININE 1.11* 0.96  CALCIUM 8.7* 7.9*   Liver Function Tests: Recent Labs  Lab 04/24/18 0545  AST 24  ALT 14  ALKPHOS 94  BILITOT 0.4  PROT 7.1  ALBUMIN 3.4*   No  results for input(s): LIPASE, AMYLASE in the last 168 hours. No results for input(s): AMMONIA in the last 168 hours. CBC: Recent Labs  Lab 04/24/18 0545 04/26/18 0529  WBC 7.2 6.5  NEUTROABS 5.4  --   HGB 11.4* 9.7*  HCT 39.1 33.9*  MCV 98.2 97.1  PLT 232 185   Cardiac Enzymes: Recent Labs  Lab 04/24/18 0545 04/24/18 1213 04/24/18 1837 04/25/18 0018  TROPONINI <0.03 <0.03 <0.03 <0.03   BNP: Invalid input(s): POCBNP CBG: No results for input(s): GLUCAP in the last 168 hours. D-Dimer No results for input(s): DDIMER in the last 72 hours. Hgb A1c Recent Labs    04/25/18 0018  HGBA1C 6.3*   Lipid Profile Recent Labs    04/25/18 0018  CHOL 122  HDL 59  LDLCALC 52  TRIG 53  CHOLHDL 2.1   Thyroid function studies Recent Labs    04/24/18 1837  TSH 66.216*   Anemia work up Recent Labs    04/26/18 0529  VITAMINB12 264   Urinalysis No results found for: COLORURINE, APPEARANCEUR, LABSPEC, [place], GLUCOSEU, HGBUR, BILIRUBINUR, KETONESUR, PROTEINUR, UROBILINOGEN, NITRITE, LEUKOCYTESUR Sepsis Labs Invalid input(s): PROCALCITONIN,  WBC,  LACTICIDVEN Microbiology No results found for this or any previous visit (from the past 240 hour(s)).   Time coordinating discharge:  I have spent 35 minutes face to face with the patient and on the ward discussing the patients care, assessment, plan and disposition with other care givers. >50% of the time was devoted counseling the patient about the risks and benefits of treatment/Discharge disposition and coordinating care.   SIGNED:  Ankit Arsenio Loader, MD  [facility] 04/27/2018, 11:43 AM   If 7PM-7AM, please contact night-coverage [email or web address]

## 2018-04-28 ENCOUNTER — Inpatient Hospital Stay (HOSPITAL_COMMUNITY): Payer: Medicare Other | Admitting: Occupational Therapy

## 2018-04-28 ENCOUNTER — Inpatient Hospital Stay (HOSPITAL_COMMUNITY): Payer: Medicare Other

## 2018-04-28 DIAGNOSIS — I69354 Hemiplegia and hemiparesis following cerebral infarction affecting left non-dominant side: Principal | ICD-10-CM

## 2018-04-28 DIAGNOSIS — G4709 Other insomnia: Secondary | ICD-10-CM

## 2018-04-28 LAB — COMPREHENSIVE METABOLIC PANEL
ALT: 14 U/L (ref 0–44)
AST: 22 U/L (ref 15–41)
Albumin: 2.5 g/dL — ABNORMAL LOW (ref 3.5–5.0)
Alkaline Phosphatase: 68 U/L (ref 38–126)
Anion gap: 10 (ref 5–15)
BUN: 15 mg/dL (ref 8–23)
CO2: 22 mmol/L (ref 22–32)
CREATININE: 0.82 mg/dL (ref 0.44–1.00)
Calcium: 7.9 mg/dL — ABNORMAL LOW (ref 8.9–10.3)
Chloride: 109 mmol/L (ref 98–111)
GFR calc Af Amer: >60 mL/min (ref >60)
GFR calc non Af Amer: >60 mL/min (ref >60)
Glucose, Bld: 97 mg/dL (ref 70–99)
Potassium: 3.9 mmol/L (ref 3.5–5.1)
SODIUM: 141 mmol/L (ref 135–145)
Total Bilirubin: 0.2 mg/dL — ABNORMAL LOW (ref 0.3–1.2)
Total Protein: 5.8 g/dL — ABNORMAL LOW (ref 6.5–8.1)

## 2018-04-28 LAB — CBC WITH DIFFERENTIAL/PLATELET
Abs Immature Granulocytes: 0.02 10*3/uL (ref 0.00–0.07)
Basophils Absolute: 0 10*3/uL (ref 0.0–0.1)
Basophils Relative: 1 %
EOS ABS: 0.2 10*3/uL (ref 0.0–0.5)
Eosinophils Relative: 3 %
HCT: 35.2 % — ABNORMAL LOW (ref 36.0–46.0)
Hemoglobin: 10.1 g/dL — ABNORMAL LOW (ref 12.0–15.0)
Immature Granulocytes: 0 %
Lymphocytes Relative: 15 %
Lymphs Abs: 0.9 10*3/uL (ref 0.7–4.0)
MCH: 27.8 pg (ref 26.0–34.0)
MCHC: 28.7 g/dL — ABNORMAL LOW (ref 30.0–36.0)
MCV: 97 fL (ref 80.0–100.0)
Monocytes Absolute: 0.6 10*3/uL (ref 0.1–1.0)
Monocytes Relative: 9 %
Neutro Abs: 4.5 10*3/uL (ref 1.7–7.7)
Neutrophils Relative %: 72 %
PLATELETS: 171 10*3/uL (ref 150–400)
RBC: 3.63 MIL/uL — ABNORMAL LOW (ref 3.87–5.11)
RDW: 16.2 % — AB (ref 11.5–15.5)
WBC: 6.2 10*3/uL (ref 4.0–10.5)
nRBC: 0 % (ref 0.0–0.2)

## 2018-04-28 MED ORDER — TRAZODONE HCL 50 MG PO TABS
25.0000 mg | ORAL_TABLET | Freq: Every evening | ORAL | Status: DC | PRN
  Administered 2018-04-29: 25 mg via ORAL
  Filled 2018-04-28 (×2): qty 1

## 2018-04-28 NOTE — Evaluation (Signed)
Occupational Therapy Assessment and Plan  Patient Details  Name: Meredith Gonzalez MRN: [id number] Date of Birth: 03-Jul-1935  OT Diagnosis: abnormal posture, ataxia, disturbance of vision, hemiplegia affecting non-dominant side, muscle weakness (generalized) and pain in joint Rehab Potential: Rehab Potential (ACUTE ONLY): Good ELOS: 10-12 days   Today's Date: 04/28/2018 OT Individual Time: 1300-1415 OT Individual Time Calculation (min): 75 min     Problem List:  Patient Active Problem List   Diagnosis Date Noted  . Subcortical infarction ([place]) 04/27/2018  . CVA (cerebral vascular accident) ([place]) 04/26/2018  . TIA (transient ischemic attack) 04/24/2018  . CAD (coronary artery disease) 08/07/2016  . (HFpEF) heart failure with preserved ejection fraction ([place]) 08/07/2016  . Ischemic cardiomyopathy 08/07/2016  . Chest pain   . NSTEMI (non-ST elevated myocardial infarction) ([place])   . Status post coronary artery stent placement   . Abnormal stress test   . SOB (shortness of breath)   . Multifocal pneumonia 07/13/2016  . Elevated troponin 07/13/2016  . Leukocytosis   . Pacemaker 05/17/2014  . Complications, pacemaker cardiac, mechanical 12/31/2012  . Edema extremities 10/08/2012  . Persistent atrial fibrillation 10/08/2012  . Hypertension 10/08/2012  . Hypothyroidism 10/08/2012  . Neuropathy 10/08/2012  . Diabetes ([place]) 10/08/2012  . Depression 09/08/2012  . Anemia 08/08/2012  . Knee fracture, left 08/07/2012  . Recurrent falls 08/07/2012  . Syncope 08/07/2012  . SDH (subdural hematoma) ([place]) 08/07/2012    Past Medical History:  Past Medical History:  Diagnosis Date  . Anemia   . Anxiety   . Arthritis    "all over" (02/11/2018)  . Basal cell carcinoma    "left leg" (02/11/2018)  . Cervical spine fracture ([place]) 12/2017   "C1-2"  . CHF (congestive heart failure) ([place])   . Chronic bronchitis ([place])   . Chronic neck pain    "since I broke my neck 6-8 wk ago" (02/11/2018)  .  Diabetic peripheral neuropathy ([place])   . Fibromyalgia   . Fracture of right humerus   . Generalized weakness   . Headache    "weekly" (02/11/2018)  . History of blood transfusion    "related to one of my femur surgeries" (02/11/2018)  . History of echocardiogram    Echo 8/18: EF 50-55, no RWMA, Gr 1 DD, calcified AV leaflets, MAC, trivial MR, mod LAE, PASP 37  . Hypertension   . Hypothyroidism   . Incontinence of urine   . Ischemic stroke ([place]) 2000   "lost part of the vision in my right eye" (02/11/2018)  . Major depression, chronic   . Myocardial infarction ([place]) 06/2016  . Pacemaker   . Recurrent falls   . Syncope and collapse   . Type 2 diabetes, diet controlled ([place])    Past Surgical History:  Past Surgical History:  Procedure Laterality Date  . ABDOMINAL HYSTERECTOMY    . ANKLE FRACTURE SURGERY Right   . BASAL CELL CARCINOMA EXCISION Left    "leg"  . CARDIAC CATHETERIZATION  ~ 2014  . CARDIOVERSION N/A 02/13/2018   Procedure: CARDIOVERSION;  Surgeon: Sueanne Margarita, MD;  Location: [facility] ENDOSCOPY;  Service: Cardiovascular;  Laterality: N/A;  . CARDIOVERSION N/A 03/27/2018   Procedure: CARDIOVERSION;  Surgeon: Lelon Perla, MD;  Location: [facility] ENDOSCOPY;  Service: Cardiovascular;  Laterality: N/A;  . CARPAL TUNNEL RELEASE     bilateral  . CATARACT EXTRACTION W/ INTRAOCULAR LENS  IMPLANT, BILATERAL Bilateral   . CHOLECYSTECTOMY OPEN    . COLONOSCOPY    . CORONARY ANGIOPLASTY  WITH STENT PLACEMENT  06/2016  . CORONARY STENT INTERVENTION N/A 07/19/2016   Procedure: Coronary Stent Intervention;  Surgeon: Peter M Martinique, MD;  Location: [place] CV LAB;  Service: Cardiovascular;  Laterality: N/A;  . EYE SURGERY     right eye catarace/lens implant  . FEMUR FRACTURE SURGERY Bilateral   . FRACTURE SURGERY    . GASTRIC BYPASS    . INSERT / REPLACE / REMOVE PACEMAKER  ~ 2012  . LEAD REVISION N/A 01/01/2013   Procedure: LEAD REVISION;  Surgeon: Evans Lance, MD;  Location:  [facility] CATH LAB;  Service: Cardiovascular;  Laterality: N/A;  . LEFT HEART CATH AND CORONARY ANGIOGRAPHY N/A 07/19/2016   Procedure: Left Heart Cath and Coronary Angiography;  Surgeon: Peter M Martinique, MD;  Location: [place] CV LAB;  Service: Cardiovascular;  Laterality: N/A;  . OVARIAN CYST SURGERY     "one side only"  . TEE WITHOUT CARDIOVERSION N/A 02/13/2018   Procedure: TRANSESOPHAGEAL ECHOCARDIOGRAM (TEE);  Surgeon: Sueanne Margarita, MD;  Location: [facility] ENDOSCOPY;  Service: Cardiovascular;  Laterality: N/A;  . TONSILLECTOMY      Assessment & Plan Clinical Impression: Patient is a 83 y.o. right handed female history of diet-controlled diabetes mellitus, CAD, CVA in 2000, hypertension, diastolic congestive heart failure, atrial fibrillation with pacemaker maintained on Eliquis. Per chart review patient lives alone at [facility] independent living facility in [place]. Independent with assistive device. She has a son that lives in [place]. Presented 04/24/2018 with left-sided weakness and ataxia. Cranial CT scan negative. Patient did not receive TPA. CT perfusion scan as well as angiogram of head and neck showed moderate to severe 60-70% right carotid bifurcation stenosis. Follow-up renal CT scan to 04/13/2018 negative. MRI not completed due to pacemaker. Echocardiogram with ejection fraction of 66% normal systolic function. Vascular surgery Dr. Donnetta Hutching follow-up for carotid stenosis plan right carotid enterectomy after recovery of CVA to follow-up as outpatient. Patient remains on Eliquis as prior to admission for CVA prophylaxis as well as history of atrial fibrillation. Therapy evaluations completed with recommendations of physical medicine rehabilitation consult.  Patient transferred to CIR on 04/27/2018 .    Patient currently requires min- mod with basic self-care skills secondary to muscle weakness, decreased cardiorespiratoy endurance, impaired timing and  sequencing, ataxia and decreased coordination, decreased visual acuity, and decreased standing balance, hemiplegia and decreased balance strategies.  Prior to hospitalization, patient could complete ADLs and IADLs with modified independent .  Patient will benefit from skilled intervention to increase independence with basic self-care skills and increase level of independence with iADL prior to discharge home independently.  Anticipate patient will require intermittent supervision and follow up home health and follow up outpatient.  OT - End of Session Activity Tolerance: Tolerates 30+ min activity with multiple rests Endurance Deficit: Yes Endurance Deficit Description: required frequent rest breaks OT Assessment Rehab Potential (ACUTE ONLY): Good OT Barriers to Discharge: Decreased caregiver support OT Patient demonstrates impairments in the following area(s): Balance;Endurance;Motor;Pain;Safety;Sensory;Skin Integrity;Vision OT Basic ADL's Functional Problem(s): Eating;Bathing;Dressing;Grooming;Toileting OT Advanced ADL's Functional Problem(s): Simple Meal Preparation;Light Housekeeping OT Transfers Functional Problem(s): Toilet;Tub/Shower OT Additional Impairment(s): Fuctional Use of Upper Extremity OT Plan OT Intensity: Minimum of 1-2 x/day, 45 to 90 minutes OT Frequency: 5 out of 7 days OT Duration/Estimated Length of Stay: 10-12 days OT Treatment/Interventions: Balance/vestibular training;Cognitive remediation/compensation;Community reintegration;Discharge planning;Disease mangement/prevention;DME/adaptive equipment instruction;Functional mobility training;Neuromuscular re-education;Pain management;Patient/family education;Psychosocial support;Self Care/advanced ADL retraining;Therapeutic Activities;Therapeutic Exercise;UE/LE Strength taining/ROM;UE/LE Coordination activities;Visual/perceptual remediation/compensation OT Self Feeding  Anticipated Outcome(s): Mod I OT Basic Self-Care  Anticipated Outcome(s): Mod I OT Toileting Anticipated Outcome(s): Mod I OT Bathroom Transfers Anticipated Outcome(s): Mod I OT Recommendation Patient destination: Home Follow Up Recommendations: Home health OT;Outpatient OT Equipment Recommended: None recommended by OT   Skilled Therapeutic Intervention OT eval completed with discussion of rehab process, OT purpose, POC, ELOS, and goals.  ADL assessment completed with bathing and dressing at shower level.  Pt ambulated to room shower with RW, as pt utilized RW or Rollator PTA, with min assist.  Pt completed toilet and walk-in shower transfer with Min assist.  Pt required Min assist for dynamic standing balance during self-care tasks.  Pt required increased assistance when donning pants and socks/shoes due to instability in sitting and standing as well as difficulty when bending/reaching due to chronic neck pain.  Pt required frequent rest breaks due to fatigue.  Returned to supine in bed and left with all needs in reach.  OT Evaluation Precautions/Restrictions  Precautions Precautions: Fall Precaution Comments: ataxic Restrictions Weight Bearing Restrictions: No Vital Signs Therapy Vitals Temp: 98.5 F (36.9 C) Temp Source: Oral Pulse Rate: 80 Resp: 18 BP: (!) 111/50 Patient Position (if appropriate): Lying Oxygen Therapy SpO2: 97 % O2 Device: Room Air Pain Pain Assessment Pain Scale: 0-10 Pain Score: 6  Pain Type: Chronic pain Pain Location: Neck Pain Orientation: Posterior Pain Descriptors / Indicators: Aching;Discomfort Pain Intervention(s): Repositioned;Shower Home Living/Prior Functioning Home Living Living Arrangements: Alone Available Help at Discharge: Family, Available PRN/intermittently Type of Home: Other(Comment) Home Access: Level entry Home Layout: One level([facility] ILF) Bathroom Shower/Tub: Walk-in shower(level entry with grab bars, hand held shower, and shower  seat) Biochemist, clinical: Programmer, systems: Yes  Lives With: Alone IADL History Homemaking Responsibilities: Yes Meal Prep Responsibility: Primary Laundry Responsibility: Primary Cleaning Responsibility: Primary(son would do vacuuming and mopping) Bill Paying/Finance Responsibility: No(son pays bills) Shopping Responsibility: Primary Current License: Yes Mode of Transportation: Car Prior Function Level of Independence: Independent with basic ADLs, Needs assistance with homemaking, Requires assistive device for independence Vacuuming: Total Shopping: (had been recently (last 3 weeks) shopping on her own) Driving: Yes Vocation: Retired Comments: used cane to help with balance till got to shopping cart when grocery shopping; rollator in the home ADL ADL Grooming: Minimal assistance Where Assessed-Grooming: Standing at sink Upper Body Bathing: Minimal assistance Where Assessed-Upper Body Bathing: Shower Lower Body Bathing: Moderate assistance Where Assessed-Lower Body Bathing: Shower Upper Body Dressing: Minimal assistance Where Assessed-Upper Body Dressing: Edge of bed Lower Body Dressing: Maximal assistance Where Assessed-Lower Body Dressing: Edge of bed Toileting: Minimal assistance Where Assessed-Toileting: Glass blower/designer: Psychiatric nurse Method: Counselling psychologist: Energy manager: Environmental education officer Method: Heritage manager: Civil engineer, contracting with back, Grab bars Vision Baseline Vision/History: Wears glasses Wears Glasses: Reading only(had cataract surgery) Patient Visual Report: Diplopia Vision Assessment?: Yes Eye Alignment: Impaired (comment)(slight disalignment during tracking) Ocular Range of Motion: Within Functional Limits Alignment/Gaze Preference: Within Defined Limits Tracking/Visual Pursuits: Decreased smoothness of horizontal tracking;Decreased  smoothness of vertical tracking Convergence: Impaired - to be further tested in functional context(Lt eye not converging with Rt) Diplopia Assessment: Objects split on top of one another;Disappears with one eye closed Depth Perception: Undershoots Cognition Overall Cognitive Status: Within Functional Limits for tasks assessed Arousal/Alertness: Awake/alert Orientation Level: Person;Place;Situation Person: Oriented Place: Oriented Situation: Oriented Year: 2020 Month: February Day of Week: Correct Memory: Appears intact Immediate Memory Recall: Sock;Blue;Bed Memory Recall: Blue;Bed  Attention: Alternating Awareness: Appears intact Problem Solving: Appears intact Executive Function: Reasoning;Organizing Reasoning: Appears intact Reasoning Impairment: Functional complex;Verbal complex Organizing: Appears intact Safety/Judgment: Appears intact Sensation Sensation Light Touch: Impaired by gross assessment(mild impairment in LUE) Proprioception: Appears Intact Coordination Gross Motor Movements are Fluid and Coordinated: No Fine Motor Movements are Fluid and Coordinated: No Finger Nose Finger Test: dysmetria on Lt Mobility  Bed Mobility Bed Mobility: Rolling Right;Supine to Sit;Sit to Supine Rolling Right: Supervision/verbal cueing Supine to Sit: Supervision/Verbal cueing Sit to Supine: Supervision/Verbal cueing Transfers Sit to Stand: Minimal Assistance - Patient > 75%  Trunk/Postural Assessment  Cervical Assessment Cervical Assessment: Exceptions to [facility] Cervical AROM Overall Cervical AROM Comments: limited AROM due to recent cervical fracture 11/2017 Thoracic Assessment Thoracic Assessment: Exceptions to WFL(kyphosis) Lumbar Assessment Lumbar Assessment: Exceptions to WFL(posterior pelvic tilt) Postural Control Postural Control: Within Functional Limits  Balance Balance Balance Assessed: Yes Static Sitting Balance Static Sitting - Level of Assistance: 6: Modified  independent (Device/Increase time) Dynamic Sitting Balance Dynamic Sitting - Level of Assistance: 5: Stand by assistance Sitting balance - Comments: cannot reach to floor/feet, wears slip on shoes Static Standing Balance Static Standing - Level of Assistance: 4: Min assist Dynamic Standing Balance Dynamic Standing - Level of Assistance: 4: Min assist Dynamic Standing - Comments: ataxic, needs assist for safety Extremity/Trunk Assessment RUE Assessment RUE Assessment: Within Functional Limits Active Range of Motion (AROM) Comments: WFL General Strength Comments: strength grossly 4/5 LUE Assessment LUE Assessment: Exceptions to [facility] Active Range of Motion (AROM) Comments: WFL, Ataxic General Strength Comments: strength grossly 4/5     Refer to Care Plan for Long Term Goals  Recommendations for other services: Therapeutic Recreation  Outing/community reintegration   Discharge Criteria: Patient will be discharged from OT if patient refuses treatment 3 consecutive times without medical reason, if treatment goals not met, if there is a change in medical status, if patient makes no progress towards goals or if patient is discharged from hospital.  The above assessment, treatment plan, treatment alternatives and goals were discussed and mutually agreed upon: by patient  Ellwood Dense [facility] 04/28/2018, 4:03 PM

## 2018-04-28 NOTE — Evaluation (Signed)
Speech Language Pathology Assessment and Plan  Patient Details  Name: Meredith Gonzalez MRN: [id number] Date of Birth: 22-Oct-1935  SLP Diagnosis:   N/A Rehab Potential:   N/A ELOS:   N/A   Today's Date: 04/28/2018 SLP Individual Time: 8372-9021 SLP Individual Time Calculation (min): 55 min   Problem List:  Patient Active Problem List   Diagnosis Date Noted  . Subcortical infarction ([place]) 04/27/2018  . CVA (cerebral vascular accident) ([place]) 04/26/2018  . TIA (transient ischemic attack) 04/24/2018  . CAD (coronary artery disease) 08/07/2016  . (HFpEF) heart failure with preserved ejection fraction ([place]) 08/07/2016  . Ischemic cardiomyopathy 08/07/2016  . Chest pain   . NSTEMI (non-ST elevated myocardial infarction) ([place])   . Status post coronary artery stent placement   . Abnormal stress test   . SOB (shortness of breath)   . Multifocal pneumonia 07/13/2016  . Elevated troponin 07/13/2016  . Leukocytosis   . Pacemaker 05/17/2014  . Complications, pacemaker cardiac, mechanical 12/31/2012  . Edema extremities 10/08/2012  . Persistent atrial fibrillation 10/08/2012  . Hypertension 10/08/2012  . Hypothyroidism 10/08/2012  . Neuropathy 10/08/2012  . Diabetes ([place]) 10/08/2012  . Depression 09/08/2012  . Anemia 08/08/2012  . Knee fracture, left 08/07/2012  . Recurrent falls 08/07/2012  . Syncope 08/07/2012  . SDH (subdural hematoma) ([place]) 08/07/2012   Past Medical History:  Past Medical History:  Diagnosis Date  . Anemia   . Anxiety   . Arthritis    "all over" (02/11/2018)  . Basal cell carcinoma    "left leg" (02/11/2018)  . Cervical spine fracture ([place]) 12/2017   "C1-2"  . CHF (congestive heart failure) ([place])   . Chronic bronchitis ([place])   . Chronic neck pain    "since I broke my neck 6-8 wk ago" (02/11/2018)  . Diabetic peripheral neuropathy ([place])   . Fibromyalgia   . Fracture of right humerus   . Generalized weakness   . Headache    "weekly" (02/11/2018)  .  History of blood transfusion    "related to one of my femur surgeries" (02/11/2018)  . History of echocardiogram    Echo 8/18: EF 50-55, no RWMA, Gr 1 DD, calcified AV leaflets, MAC, trivial MR, mod LAE, PASP 37  . Hypertension   . Hypothyroidism   . Incontinence of urine   . Ischemic stroke ([place]) 2000   "lost part of the vision in my right eye" (02/11/2018)  . Major depression, chronic   . Myocardial infarction ([place]) 06/2016  . Pacemaker   . Recurrent falls   . Syncope and collapse   . Type 2 diabetes, diet controlled ([place])    Past Surgical History:  Past Surgical History:  Procedure Laterality Date  . ABDOMINAL HYSTERECTOMY    . ANKLE FRACTURE SURGERY Right   . BASAL CELL CARCINOMA EXCISION Left    "leg"  . CARDIAC CATHETERIZATION  ~ 2014  . CARDIOVERSION N/A 02/13/2018   Procedure: CARDIOVERSION;  Surgeon: Sueanne Margarita, MD;  Location[facility] ENDOSCOPY;  Service: Cardiovascular;  Laterality: N/A;  . CARDIOVERSION N/A 03/27/2018   Procedure: CARDIOVERSION;  Surgeon: Lelon Perla, MD;  Location: [facility] ENDOSCOPY;  Service: Cardiovascular;  Laterality: N/A;  . CARPAL TUNNEL RELEASE     bilateral  . CATARACT EXTRACTION W/ INTRAOCULAR LENS  IMPLANT, BILATERAL Bilateral   . CHOLECYSTECTOMY OPEN    . COLONOSCOPY    . CORONARY ANGIOPLASTY WITH STENT PLACEMENT  06/2016  . CORONARY STENT INTERVENTION N/A 07/19/2016   Procedure: Coronary Stent  Intervention;  Surgeon: Peter M Martinique, MD;  Location: [place] CV LAB;  Service: Cardiovascular;  Laterality: N/A;  . EYE SURGERY     right eye catarace/lens implant  . FEMUR FRACTURE SURGERY Bilateral   . FRACTURE SURGERY    . GASTRIC BYPASS    . INSERT / REPLACE / REMOVE PACEMAKER  ~ 2012  . LEAD REVISION N/A 01/01/2013   Procedure: LEAD REVISION;  Surgeon: Evans Lance, MD;  Location: [facility] CATH LAB;  Service: Cardiovascular;  Laterality: N/A;  . LEFT HEART CATH AND CORONARY ANGIOGRAPHY N/A 07/19/2016   Procedure: Left Heart Cath and  Coronary Angiography;  Surgeon: Peter M Martinique, MD;  Location: [place] CV LAB;  Service: Cardiovascular;  Laterality: N/A;  . OVARIAN CYST SURGERY     "one side only"  . TEE WITHOUT CARDIOVERSION N/A 02/13/2018   Procedure: TRANSESOPHAGEAL ECHOCARDIOGRAM (TEE);  Surgeon: Sueanne Margarita, MD;  Location: [facility] ENDOSCOPY;  Service: Cardiovascular;  Laterality: N/A;  . TONSILLECTOMY      Assessment / Plan / Recommendation Clinical Impression Meredith Gonzalez is an 83 year old right handed female history of diet-controlled diabetes mellitus, CAD, CVA in 2000, hypertension, diastolic congestive heart failure, atrial fibrillation with pacemaker maintained on Eliquis. Per chart review patient lives alone at [facility] independent living facility in [place]. Independent with assistive device. She has a son that lives in [place]. Presented 04/24/2018 with left-sided weakness and ataxia. Cranial CT scan negative. Patient did not receive TPA. CT perfusion scan as well as angiogram of head and neck showed moderate to severe 60-70% right carotid bifurcation stenosis. Follow-up renal CT scan to 04/13/2018 negative. MRI not completed due to pacemaker. Echocardiogram with ejection fraction of 18% normal systolic function. Vascular surgery Dr. Donnetta Hutching follow-up for carotid stenosis plan right carotid enterectomy after recovery of CVA to follow-up as outpatient. Patient remains on Eliquis as prior to admission for CVA prophylaxis as well as history of atrial fibrillation. Therapy evaluations completed with recommendations of physical medicine rehabilitation consult. Patient was admitted for a compress of rehabilitation program.  Pt presents with functional higher level cognitive linguistic skills supported by formal cognitive linguistic assessment utilizing Cogn[employer] in which pt scored WFL on all categories. Pt demonstrated recall of 3 out 4 novel words in delayed recall task, however  recalled 4 out 4 with categorical cues. Pt demonstrates higher level attention, problem solving, anticipatory awareness, delayed recall, ability to follow complex commands and express abstract thoughts. Pt reporting being at cognitive baseline and agrees with recommendation of no further ST services. Pt would not benefit from skilled ST services in order to maximize functional independence and reduce burden of care, likely requiring supervision at discharge with continued skilled ST services.   Skilled Therapeutic Interventions          Skilled ST services focused on cognitive and education skills. SLP facilitated administration of cognitive linguistic formal assessment and provided education of results. SLP provided strategies for memory of novel information, providing handout. SLP further assessed complex problem solving skills utilizing medication management task pt demonstrated mod I. All questions answered to satisfaction. Pt was left in room with call bell within reach and bed alarm set. No further ST services needed.    SLP Assessment  Patient does not need any further Speech Lanaguage Pathology Services    Recommendations  SLP Diet Recommendations: Thin Liquid Administration via: Straw;Cup Medication Administration: Whole meds with liquid Supervision: Patient able to self feed Postural Changes and/or Swallow  Maneuvers: Seated upright 90 degrees Oral Care Recommendations: Oral care BID Patient destination: Home Follow up Recommendations: None Equipment Recommended: None recommended by SLP    SLP Frequency   N/A  SLP Duration  SLP Intensity  SLP Treatment/Interventions  N/A   N/A   N/A   Pain Pain Assessment Pain Scale: 0-10 Pain Score: 0-No pain  Prior Functioning Cognitive/Linguistic Baseline: Within functional limits Type of Home: Other(Comment)  Lives With: Alone Available Help at Discharge: Family;Available PRN/intermittently Vocation: Retired  Industrial/product designer Term  Goals: No short term Landscape architect to [place] for Long Term Goals  Recommendations for other services: None   Discharge Criteria: Patient will be discharged from SLP if patient refuses treatment 3 consecutive times without medical reason, if treatment goals not met, if there is a change in medical status, if patient makes no progress towards goals or if patient is discharged from hospital.  The above assessment, treatment plan, treatment alternatives and goals were discussed and mutually agreed upon: by patient  Monique Hefty  [facility] 04/28/2018, 8:59 AM

## 2018-04-28 NOTE — Progress Notes (Signed)
Per nursing, patient was given "Data Collection Information Summary for Patients in Inpatient Rehabilitation Facilities with attached Privacy Act Statement Health Care Records" upon admission.    Patient information reviewed and entered into eRehab System by Becky Evalee Gerard, PPS coordinator. Information including medical coding, function ability, and quality indicators will be reviewed and updated through discharge.   

## 2018-04-28 NOTE — Evaluation (Signed)
Physical Therapy Assessment and Plan  Patient Details  Name: Meredith Gonzalez MRN: [id number] Date of Birth: 12-Mar-1936  PT Diagnosis: Abnormality of gait, Ataxia, Ataxic gait and Coordination disorder Rehab Potential: Good ELOS: 10-14   Today's Date: 04/28/2018 PT Individual Time: 1100-1200 PT Individual Time Calculation (min): 60 min    Problem List:  Patient Active Problem List   Diagnosis Date Noted  . Subcortical infarction ([place]) 04/27/2018  . CVA (cerebral vascular accident) ([place]) 04/26/2018  . TIA (transient ischemic attack) 04/24/2018  . CAD (coronary artery disease) 08/07/2016  . (HFpEF) heart failure with preserved ejection fraction ([place]) 08/07/2016  . Ischemic cardiomyopathy 08/07/2016  . Chest pain   . NSTEMI (non-ST elevated myocardial infarction) ([place])   . Status post coronary artery stent placement   . Abnormal stress test   . SOB (shortness of breath)   . Multifocal pneumonia 07/13/2016  . Elevated troponin 07/13/2016  . Leukocytosis   . Pacemaker 05/17/2014  . Complications, pacemaker cardiac, mechanical 12/31/2012  . Edema extremities 10/08/2012  . Persistent atrial fibrillation 10/08/2012  . Hypertension 10/08/2012  . Hypothyroidism 10/08/2012  . Neuropathy 10/08/2012  . Diabetes ([place]) 10/08/2012  . Depression 09/08/2012  . Anemia 08/08/2012  . Knee fracture, left 08/07/2012  . Recurrent falls 08/07/2012  . Syncope 08/07/2012  . SDH (subdural hematoma) ([place]) 08/07/2012    Past Medical History:  Past Medical History:  Diagnosis Date  . Anemia   . Anxiety   . Arthritis    "all over" (02/11/2018)  . Basal cell carcinoma    "left leg" (02/11/2018)  . Cervical spine fracture ([place]) 12/2017   "C1-2"  . CHF (congestive heart failure) ([place])   . Chronic bronchitis ([place])   . Chronic neck pain    "since I broke my neck 6-8 wk ago" (02/11/2018)  . Diabetic peripheral neuropathy ([place])   . Fibromyalgia   . Fracture of right humerus   . Generalized  weakness   . Headache    "weekly" (02/11/2018)  . History of blood transfusion    "related to one of my femur surgeries" (02/11/2018)  . History of echocardiogram    Echo 8/18: EF 50-55, no RWMA, Gr 1 DD, calcified AV leaflets, MAC, trivial MR, mod LAE, PASP 37  . Hypertension   . Hypothyroidism   . Incontinence of urine   . Ischemic stroke ([place]) 2000   "lost part of the vision in my right eye" (02/11/2018)  . Major depression, chronic   . Myocardial infarction ([place]) 06/2016  . Pacemaker   . Recurrent falls   . Syncope and collapse   . Type 2 diabetes, diet controlled ([place])    Past Surgical History:  Past Surgical History:  Procedure Laterality Date  . ABDOMINAL HYSTERECTOMY    . ANKLE FRACTURE SURGERY Right   . BASAL CELL CARCINOMA EXCISION Left    "leg"  . CARDIAC CATHETERIZATION  ~ 2014  . CARDIOVERSION N/A 02/13/2018   Procedure: CARDIOVERSION;  Surgeon: Sueanne Margarita, MD;  Location: [facility] ENDOSCOPY;  Service: Cardiovascular;  Laterality: N/A;  . CARDIOVERSION N/A 03/27/2018   Procedure: CARDIOVERSION;  Surgeon: Lelon Perla, MD;  Location: [facility] ENDOSCOPY;  Service: Cardiovascular;  Laterality: N/A;  . CARPAL TUNNEL RELEASE     bilateral  . CATARACT EXTRACTION W/ INTRAOCULAR LENS  IMPLANT, BILATERAL Bilateral   . CHOLECYSTECTOMY OPEN    . COLONOSCOPY    . CORONARY ANGIOPLASTY WITH STENT PLACEMENT  06/2016  . CORONARY STENT INTERVENTION N/A 07/19/2016  Procedure: Coronary Stent Intervention;  Surgeon: Peter M Martinique, MD;  Location: [place] CV LAB;  Service: Cardiovascular;  Laterality: N/A;  . EYE SURGERY     right eye catarace/lens implant  . FEMUR FRACTURE SURGERY Bilateral   . FRACTURE SURGERY    . GASTRIC BYPASS    . INSERT / REPLACE / REMOVE PACEMAKER  ~ 2012  . LEAD REVISION N/A 01/01/2013   Procedure: LEAD REVISION;  Surgeon: Evans Lance, MD;  Location: [facility] CATH LAB;  Service: Cardiovascular;  Laterality: N/A;  . LEFT HEART CATH AND CORONARY ANGIOGRAPHY  N/A 07/19/2016   Procedure: Left Heart Cath and Coronary Angiography;  Surgeon: Peter M Martinique, MD;  Location: [place] CV LAB;  Service: Cardiovascular;  Laterality: N/A;  . OVARIAN CYST SURGERY     "one side only"  . TEE WITHOUT CARDIOVERSION N/A 02/13/2018   Procedure: TRANSESOPHAGEAL ECHOCARDIOGRAM (TEE);  Surgeon: Sueanne Margarita, MD;  Location: [facility] ENDOSCOPY;  Service: Cardiovascular;  Laterality: N/A;  . TONSILLECTOMY      Assessment & Plan Clinical Impression: Meredith Gonzalez is an 83 year old right handed female history of diet-controlled diabetes mellitus, CAD, CVA in 2000, hypertension, diastolic congestive heart failure, atrial fibrillation with pacemaker maintained on Eliquis. Per chart review patient lives alone at [facility] independent living facility in [place]. Independent with assistive device. She has a son that lives in [place]. Presented 04/24/2018 with left-sided weakness and ataxia. Cranial CT scan negative. Patient did not receive TPA. CT perfusion scan as well as angiogram of head and neck showed moderate to severe 60-70% right carotid bifurcation stenosis. Follow-up renal CT scan to 04/13/2018 negative. MRI not completed due to pacemaker. Echocardiogram with ejection fraction of 32% normal systolic function. Vascular surgery Dr. Donnetta Hutching follow-up for carotid stenosis plan right carotid enterectomy after recovery of CVA to follow-up as outpatient. Patient remains on Eliquis as prior to admission for CVA prophylaxis as well as history of atrial fibrillation..  Patient transferred to CIR on 04/27/2018 .   Patient currently requires min with mobility secondary to impaired timing and sequencing, unbalanced muscle activation, ataxia and decreased coordination.  Prior to hospitalization, patient was modified independent  with mobility and lived with Alone in a Other(Comment) home.  Home access is  Level entry.  Patient will benefit from skilled  PT intervention to maximize safe functional mobility and minimize fall risk for planned discharge home with intermittent assist.  Anticipate patient will benefit from follow up [facility] at discharge.  PT - End of Session Activity Tolerance: Decreased this session;Tolerates 30+ min activity with multiple rests Endurance Deficit: Yes PT Assessment Rehab Potential (ACUTE/IP ONLY): Good PT Barriers to Discharge: Decreased caregiver support PT Barriers to Discharge Comments: lives alone, son is support who works PT Patient demonstrates impairments in the following area(s): Balance;Safety;Endurance;Motor PT Transfers Functional Problem(s): Bed Mobility;Furniture;Bed to Chair;Car PT Locomotion Functional Problem(s): Ambulation;Stairs;Wheelchair Mobility PT Plan PT Intensity: Minimum of 1-2 x/day ,45 to 90 minutes PT Frequency: 5 out of 7 days PT Duration Estimated Length of Stay: 10-14 PT Treatment/Interventions: Ambulation/gait training;Balance/vestibular training;DME/adaptive equipment instruction;Functional electrical stimulation;Functional mobility training;Neuromuscular re-education;Patient/family education;Stair training;Therapeutic Activities;Therapeutic Exercise;UE/LE Strength taining/ROM;UE/LE Coordination activities;Wheelchair propulsion/positioning PT Transfers Anticipated Outcome(s): mod I PT Locomotion Anticipated Outcome(s): mod I PT Recommendation Recommendations for Other Services: Therapeutic Recreation consult Therapeutic Recreation Interventions: Outing/community reintergration Follow Up Recommendations: Home health PT Patient destination: Home Equipment Recommended: None recommended by PT  Skilled Therapeutic Intervention Patient in recliner and educated on evaluation procedures.  [employer] rep present in room to interrogate PPM and troubleshoot why not sending info per pt since January.  Patient performed sit to stand with min A cues for hand placement and ambulated with RW 200'  min A cues for shorter step length and upright posture due to L LE ataxia and at times increased step length with one to two episodes of knee buckling.  Performed sit <>supine to bed with S and rolling over.  Patient c/o 6/10 neck pain due to chronic issues since fall with neck fracture last year.  RN informed need for k-pad to help with pain.  Patient stand pivot with RW to w/c min A and propelled w/c 100' with min A due to veering to L.  Negotiated 4 steps with rails and min A cues for sequencing and safety.  Performed car transfer with min A to sedan height.  Negotiated ramp with RW and min A cues for safety placing walker.  Assisted to room with w/c and to recliner min A with RW to set up lunch tray and left with call bell and belt alarm in place.  PT Evaluation Precautions/Restrictions Precautions Precautions: Fall Precaution Comments: ataxic Restrictions Weight Bearing Restrictions: No Pain Pain Assessment Pain Score: 6  Pain Type: Chronic pain Pain Location: Neck Pain Orientation: Posterior Pain Descriptors / Indicators: Aching;Discomfort Pain Onset: On-going Pain Intervention(s): Repositioned;Heat applied Home Living/Prior Functioning Home Living Available Help at Discharge: Family;Available PRN/intermittently Home Access: Level entry Home Layout: One level([facility] ILF) Bathroom Shower/Tub: Walk-in Radio producer: Standard  Lives With: Alone Prior Function Level of Independence: Independent with basic ADLs;Needs assistance with homemaking Vacuuming: Total Shopping: (had been recently (last 3 weeks) shopping on her own) Driving: Yes Vocation: Retired Comments: used cane to help with balance till got to shopping cart when grocery shopping; rollator in the home Vision/Perception  Vision - Assessment Eye Alignment: Impaired (comment)(slight disalignment during tracking) Ocular Range of Motion: Within Functional Limits Alignment/Gaze  Preference: Within Defined Limits Tracking/Visual Pursuits: Decreased smoothness of horizontal tracking;Decreased smoothness of vertical tracking Convergence: Impaired - to be further tested in functional context(Lt eye not converging with Rt) Diplopia Assessment: Objects split on top of one another;Disappears with one eye closed  Cognition Overall Cognitive Status: Within Functional Limits for tasks assessed Arousal/Alertness: Awake/alert Orientation Level: Oriented X4 Safety/Judgment: Appears intact Sensation Sensation Light Touch: Appears Intact Proprioception: Appears Intact Coordination Gross Motor Movements are Fluid and Coordinated: No Coordination and Movement Description: decreased coordination on L compered to R with RAM (marching) Motor  Motor Motor: Ataxia Motor - Skilled Clinical Observations: L LE/UE ataxia  Mobility Bed Mobility Bed Mobility: Rolling Right;Supine to Sit;Sit to Supine Rolling Right: Supervision/verbal cueing Supine to Sit: Supervision/Verbal cueing Sit to Supine: Supervision/Verbal cueing Transfers Transfers: Sit to Stand Sit to Stand: Minimal Assistance - Patient > 75% Transfer (Assistive device): Rolling walker Locomotion  Gait Ambulation: Yes Gait Assistance: Minimal Assistance - Patient > 75% Gait Distance (Feet): 200 Feet Assistive device: Rolling walker Gait Assistance Details: Verbal cues for safe use of DME/AE;Verbal cues for technique Gait Assistance Details: cues for shorter steps for increased control due to L ataxia Stairs / Additional Locomotion Stairs: Yes Stairs Assistance: Minimal Assistance - Patient > 75% Stair Management Technique: Two rails;Step to pattern;Forwards Number of Stairs: 4 Height of Stairs: 6 Ramp: Minimal Assistance - Patient >75% Wheelchair Mobility Wheelchair Mobility: Yes Wheelchair Assistance: Minimal assistance - Patient >75% Wheelchair Propulsion: Both upper extremities Wheelchair Parts Management:  Needs assistance Distance:  100'  Trunk/Postural Assessment  Cervical Assessment Cervical Assessment: Exceptions to [facility] Cervical AROM Overall Cervical AROM Comments: limited AROM due to recent cervical fracture 11/2017 Thoracic Assessment Thoracic Assessment: Exceptions to WFL(kyphosis) Lumbar Assessment Lumbar Assessment: Exceptions to WFL(posterior pelvic tilt) Postural Control Postural Control: Within Functional Limits  Balance Balance Balance Assessed: Yes Static Sitting Balance Static Sitting - Level of Assistance: 6: Modified independent (Device/Increase time) Dynamic Sitting Balance Dynamic Sitting - Level of Assistance: 5: Stand by assistance Sitting balance - Comments: cannot reach to floor/feet, wears slip on shoes Static Standing Balance Static Standing - Level of Assistance: 4: Min assist Dynamic Standing Balance Dynamic Standing - Level of Assistance: 4: Min assist Dynamic Standing - Comments: ataxic, needs assist for safety Extremity Assessment  RUE Assessment RUE Assessment: Within Functional Limits Active Range of Motion (AROM) Comments: WFL General Strength Comments: strength grossly 4/5 LUE Assessment LUE Assessment: Exceptions to [facility] Active Range of Motion (AROM) Comments: WFL, Ataxic General Strength Comments: strength grossly 4/5 RLE Assessment Active Range of Motion (AROM) Comments: WFL General Strength Comments: hip flexion 4/5, knee extension 4+/5, ankle DF 4/5 LLE Assessment General Strength Comments: Strength grossly hip flex 4-5/, knee extension 4/5, ankle DF 4/5 and decreased coordination as previously noted    Refer to Care Plan for Long Term Goals  Recommendations for other services: Therapeutic Recreation  Outing/community reintegration  Discharge Criteria: Patient will be discharged from PT if patient refuses treatment 3 consecutive times without medical reason, if treatment goals not met, if there is a change in medical status, if patient  makes no progress towards goals or if patient is discharged from hospital.  The above assessment, treatment plan, treatment alternatives and goals were discussed and mutually agreed upon: by patient  Reginia Naas 04/28/2018, 4:02 PM  Magda Kiel, [place] 04/28/2018

## 2018-04-28 NOTE — Progress Notes (Signed)
Social Work  Social Work Assessment and Plan  Patient Details  Name: Meredith Gonzalez MRN: [id number] Date of Birth: 1935-10-21  Today's Date: 04/28/2018  Problem List:  Patient Active Problem List   Diagnosis Date Noted  . Subcortical infarction ([place]) 04/27/2018  . CVA (cerebral vascular accident) ([place]) 04/26/2018  . TIA (transient ischemic attack) 04/24/2018  . CAD (coronary artery disease) 08/07/2016  . (HFpEF) heart failure with preserved ejection fraction ([place]) 08/07/2016  . Ischemic cardiomyopathy 08/07/2016  . Chest pain   . NSTEMI (non-ST elevated myocardial infarction) ([place])   . Status post coronary artery stent placement   . Abnormal stress test   . SOB (shortness of breath)   . Multifocal pneumonia 07/13/2016  . Elevated troponin 07/13/2016  . Leukocytosis   . Pacemaker 05/17/2014  . Complications, pacemaker cardiac, mechanical 12/31/2012  . Edema extremities 10/08/2012  . Persistent atrial fibrillation 10/08/2012  . Hypertension 10/08/2012  . Hypothyroidism 10/08/2012  . Neuropathy 10/08/2012  . Diabetes ([place]) 10/08/2012  . Depression 09/08/2012  . Anemia 08/08/2012  . Knee fracture, left 08/07/2012  . Recurrent falls 08/07/2012  . Syncope 08/07/2012  . SDH (subdural hematoma) ([place]) 08/07/2012   Past Medical History:  Past Medical History:  Diagnosis Date  . Anemia   . Anxiety   . Arthritis    "all over" (02/11/2018)  . Basal cell carcinoma    "left leg" (02/11/2018)  . Cervical spine fracture ([place]) 12/2017   "C1-2"  . CHF (congestive heart failure) ([place])   . Chronic bronchitis ([place])   . Chronic neck pain    "since I broke my neck 6-8 wk ago" (02/11/2018)  . Diabetic peripheral neuropathy ([place])   . Fibromyalgia   . Fracture of right humerus   . Generalized weakness   . Headache    "weekly" (02/11/2018)  . History of blood transfusion    "related to one of my femur surgeries" (02/11/2018)  . History of echocardiogram    Echo 8/18: EF 50-55, no  RWMA, Gr 1 DD, calcified AV leaflets, MAC, trivial MR, mod LAE, PASP 37  . Hypertension   . Hypothyroidism   . Incontinence of urine   . Ischemic stroke ([place]) 2000   "lost part of the vision in my right eye" (02/11/2018)  . Major depression, chronic   . Myocardial infarction ([place]) 06/2016  . Pacemaker   . Recurrent falls   . Syncope and collapse   . Type 2 diabetes, diet controlled ([place])    Past Surgical History:  Past Surgical History:  Procedure Laterality Date  . ABDOMINAL HYSTERECTOMY    . ANKLE FRACTURE SURGERY Right   . BASAL CELL CARCINOMA EXCISION Left    "leg"  . CARDIAC CATHETERIZATION  ~ 2014  . CARDIOVERSION N/A 02/13/2018   Procedure: CARDIOVERSION;  Surgeon: Sueanne Margarita, MD;  Location: [facility] ENDOSCOPY;  Service: Cardiovascular;  Laterality: N/A;  . CARDIOVERSION N/A 03/27/2018   Procedure: CARDIOVERSION;  Surgeon: Lelon Perla, MD;  Location: [facility] ENDOSCOPY;  Service: Cardiovascular;  Laterality: N/A;  . CARPAL TUNNEL RELEASE     bilateral  . CATARACT EXTRACTION W/ INTRAOCULAR LENS  IMPLANT, BILATERAL Bilateral   . CHOLECYSTECTOMY OPEN    . COLONOSCOPY    . CORONARY ANGIOPLASTY WITH STENT PLACEMENT  06/2016  . CORONARY STENT INTERVENTION N/A 07/19/2016   Procedure: Coronary Stent Intervention;  Surgeon: Peter M Martinique, MD;  Location: [place] CV LAB;  Service: Cardiovascular;  Laterality: N/A;  . EYE SURGERY  right eye catarace/lens implant  . FEMUR FRACTURE SURGERY Bilateral   . FRACTURE SURGERY    . GASTRIC BYPASS    . INSERT / REPLACE / REMOVE PACEMAKER  ~ 2012  . LEAD REVISION N/A 01/01/2013   Procedure: LEAD REVISION;  Surgeon: Evans Lance, MD;  Location: [facility] CATH LAB;  Service: Cardiovascular;  Laterality: N/A;  . LEFT HEART CATH AND CORONARY ANGIOGRAPHY N/A 07/19/2016   Procedure: Left Heart Cath and Coronary Angiography;  Surgeon: Peter M Martinique, MD;  Location: [place] CV LAB;  Service: Cardiovascular;  Laterality: N/A;  . OVARIAN CYST  SURGERY     "one side only"  . TEE WITHOUT CARDIOVERSION N/A 02/13/2018   Procedure: TRANSESOPHAGEAL ECHOCARDIOGRAM (TEE);  Surgeon: Sueanne Margarita, MD;  Location: [facility] ENDOSCOPY;  Service: Cardiovascular;  Laterality: N/A;  . TONSILLECTOMY     Social History:  reports that she quit smoking about 45 years ago. Her smoking use included cigarettes. She has a 2.00 pack-year smoking history. She has never used smokeless tobacco. She reports current alcohol use. She reports that she does not use drugs.  Family / Support Systems Marital Status: Widow/Widower Patient Roles: Parent Children: Lennette Bihari [phone] Other Supports: Daughter in-law and grandchildren Anticipated Caregiver: Depends upon the care pt required Ability/Limitations of Caregiver: Son works and has a wife Careers adviser: Intermittent Family Dynamics: Close with surviving son her other son passed away due to ETOH. She moved here in 2012 to be closer to her son and his family. She lives in a independent living home at [facility]. She has always been independent and wants to remain so.   Social History Preferred language: English Religion: Christian Cultural Background: No issues Education: High School Read: Yes Write: Yes Employment Status: Retired Public relations account executive Issues: No issues Guardian/Conservator: None-according to MD pt is capable of making her own decisions while here. She does want her son involved also while here   Abuse/Neglect Abuse/Neglect Assessment Can Be Completed: Yes Physical Abuse: Denies Verbal Abuse: Denies Sexual Abuse: Denies Exploitation of patient/patient's resources: Denies Self-Neglect: Denies  Emotional Status Pt's affect, behavior and adjustment status: Pt is motvated to do well and regain her independence, she has lived alone in her home on the retirement property and wants to remain there. Since Sept she has recovered  from a broken neck, pneumonia and CHF. She  feels this is just one more thing to deal with.  Recent Psychosocial Issues: other health issues-new pacemaker monitor since her's was not monitoring her Psychiatric History: No history deferred depression screen due to seems to be coping appropriately and verbalizing her concerns and feelings. Will monitor and see if would benefit from seeing neuro-psych while here.  Substance Abuse History: No issues  Patient / Family Perceptions, Expectations & Goals Pt/Family understanding of illness & functional limitations: Pt is able to explain her stroke and weakness from this. She has spoken to MD regarding the endartectomy she will need once recovered from this stroke and is stronger. Pt feels her questions are being addressed and she is working on her recovery. Premorbid pt/family roles/activities: Mom, grandmother, retiree, church member, etc Anticipated changes in roles/activities/participation: resume Pt/family expectations/goals: Pt states: " I want to be able to return home and do for myself, like I always have."  Son states: " I can help with some things but can not provide 24 hour care."  [employer]: Other (Comment)(been to [facility] before doesn't want to go back) Premorbid Home Care/DME Agencies:  Other (Comment)(rw, bedisde commode & tub seat) Transportation available at discharge: Son and pt was driving Resource referrals recommended: Support group (specify)  Discharge Planning Living Arrangements: Alone Support Systems: Children, Water engineer, Church/faith community Type of Residence: Independent Living([facility]) Insurance Resources: [employer], Multimedia programmer (specify)([employer]) Financial Resources: Radio broadcast assistant Screen Referred: No Living Expenses: Rent Money Management: Patient, Family Does the patient have any problems obtaining your medications?: No Home Management: Patient and son does the heavy  home management Patient/Family Preliminary Plans: Return back to independent living home if able. Son can not provide 24 hr care if this is needed due to he works and has a family. Will await team evaluations and work on the best plan for pt. She was completely independent and was driving prior to admission. Sw Barriers to Discharge: Decreased caregiver support Sw Barriers to Discharge Comments: Does not have 24 hr care Social Work Anticipated Follow Up Needs: HH/OP, Support Group  Clinical Impression Pleasant female who is motivated to do well and wants to return to her independent living home, if possible. Son can intermittently check on her daily. Will work on discharge needs and safe plan for her. She appears to be doing well and making progress in her therapies.  Elease Hashimoto 04/28/2018, 1:00 PM

## 2018-04-28 NOTE — Progress Notes (Signed)
[place] PHYSICAL MEDICINE & REHABILITATION PROGRESS NOTE   Subjective/Complaints:  Slept  Poorly but has chronic sleep problems reported taking 40mg  melatonin at noc without improvement  ROS- denies CP, SOB, N/V/D  Objective:   No results found. Recent Labs    04/26/18 0529 04/28/18 0633  WBC 6.5 6.2  HGB 9.7* 10.1*  HCT 33.9* 35.2*  PLT 185 171   Recent Labs    04/26/18 0529 04/28/18 0633  NA 140 141  K 3.7 3.9  CL 108 109  CO2 22 22  GLUCOSE 151* 97  BUN 21 15  CREATININE 0.96 0.82  CALCIUM 7.9* 7.9*   No intake or output data in the 24 hours ending 04/28/18 0905   Physical Exam: Vital Signs Blood pressure 136/89, pulse 88, temperature 98.2 F (36.8 C), temperature source Oral, resp. rate 18, height 5\' 5"  (1.651 m), weight 57.3 kg, SpO2 99 %.     Assessment/Plan: 1. Functional deficits secondary to left-sided weaknesssecondary to subcortical/brainstem CVA which require 3+ hours per day of interdisciplinary therapy in a comprehensive inpatient rehab setting.  Physiatrist is providing close team supervision and 24 hour management of active medical problems listed below.  Physiatrist and rehab team continue to assess barriers to discharge/monitor patient progress toward functional and medical goals  Care Tool:  Bathing              Bathing assist       Upper Body Dressing/Undressing Upper body dressing        Upper body assist      Lower Body Dressing/Undressing Lower body dressing            Lower body assist       Toileting Toileting    Toileting assist Assist for toileting: Contact Guard/Touching assist     Transfers Chair/bed transfer  Transfers assist     Chair/bed transfer assist level: Minimal Assistance - Patient > 75%     Locomotion Ambulation   Ambulation assist              Walk 10 feet activity   Assist           Walk 50 feet activity   Assist           Walk 150 feet  activity   Assist           Walk 10 feet on uneven surface  activity   Assist           Wheelchair     Assist               Wheelchair 50 feet with 2 turns activity    Assist            Wheelchair 150 feet activity     Assist            Medical Problem List and Plan: 1.Ataxic gait with left-sided weaknesssecondary to subcortical/brainstem CVA. MRI not completed due to pacemaker CIR PT, OT evals today -plan for follow up with vascular surgery as outpt after discharge to discuss potential CEA 2. DVT Prophylaxis/Anticoagulation: Eliquis 3. Pain Management:Neurontin 300 mg daily at 600 mg daily at bedtime 4. Mood:Provide emotional support 5. Neuropsych: This patientiscapable of making decisions on herown behalf. 6. Skin/Wound Care:Routine skin checks 7. Fluids/Electrolytes/Nutrition:Routine in and out's with follow-up chemistries 8. Atrial fibrillation with history of pacemaker. Continue amiodarone 200 mg daily. -on Chronic Eliquis Was on pindolol at home will monitor HR Vitals:   04/27/18 1931 04/28/18  0517  BP: 137/60 136/89  Pulse: 68 88  Resp: 18 18  Temp: 97.9 F (36.6 C) 98.2 F (36.8 C)  SpO2: 99% 99%   9. Diastolic congestive heart failure. Monitor for any signs of fluid overload 10. Hypothyroidism. Synthroid 11. Hyperlipidemia. Lipitor 12. Diet controlled diabetes mellitus. Blood sugar checks discontinuedas sugars well controlled    13. Constipation. Laxative assistance  14.  Insomnia - took "40mg  Melatonin" at home but didn't help, trial trazodone  LOS: 1 days A FACE TO [place] E  04/28/2018, 9:05 AM

## 2018-04-28 NOTE — Care Management Note (Signed)
[facility] Individual Statement of Services  Patient Name:  Meredith Gonzalez  Date:  04/28/2018  Welcome to the [facility].  Our goal is to provide you with an individualized program based on your diagnosis and situation, designed to meet your specific needs.  With this comprehensive rehabilitation program, you will be expected to participate in at least 3 hours of rehabilitation therapies Monday-Friday, with modified therapy programming on the weekends.  Your rehabilitation program will include the following services:  Physical Therapy (PT), Occupational Therapy (OT), Speech Therapy (ST), 24 hour per day rehabilitation nursing, Therapeutic Recreaction (TR), Case Management (Social Worker), Rehabilitation Medicine, Nutrition Services and Pharmacy Services  Weekly team conferences will be held on Wednesday to discuss your progress.  Your Social Worker will talk with you frequently to get your input and to update you on team discussions.  Team conferences with you and your family in attendance may also be held.  Expected length of stay: 10 days  Overall anticipated outcome: mod/i level-supervision with stairs  Depending on your progress and recovery, your program may change. Your Social Worker will coordinate services and will keep you informed of any changes. Your Social Worker's name and contact numbers are listed  below.  The following services may also be recommended but are not provided by the [facility] will be made to provide these services after discharge if needed.  Arrangements include referral to agencies that provide these services.  Your insurance has been verified to be:  [place] Your primary doctor is:  Sinclair Ship  Pertinent information will be shared with your doctor and your insurance  company.  Social Worker:  Ovidio Kin, [place] or ([phone]  Information discussed with and copy given to patient by: Elease Hashimoto, 04/28/2018, 10:28 AM

## 2018-04-29 ENCOUNTER — Inpatient Hospital Stay (HOSPITAL_COMMUNITY): Payer: Medicare Other | Admitting: Physical Therapy

## 2018-04-29 ENCOUNTER — Inpatient Hospital Stay (HOSPITAL_COMMUNITY): Payer: Medicare Other | Admitting: Occupational Therapy

## 2018-04-29 ENCOUNTER — Inpatient Hospital Stay (HOSPITAL_COMMUNITY): Payer: Medicare Other

## 2018-04-29 NOTE — Progress Notes (Signed)
Social Work Patient ID: Meredith Gonzalez, female   DOB: Oct 23, 1935, 83 y.o.   MRN: [id number]  Met with pt and spoke with son via telephone to discuss team conference goals mod/i level and supervision for steps. Discharge date is 2/13. She has made good progress since event and both she and son are very pleased with her progress. Will work toward discharge next Thursday.

## 2018-04-29 NOTE — Progress Notes (Signed)
Occupational Therapy Session Note  Patient Details  Name: Meredith Gonzalez MRN: [id number] Date of Birth: [birth year]  Today's Date: 04/29/2018 OT Individual Time: 1100-1200 OT Individual Time Calculation (min): 60 min    Short Term Goals: Week 1:  OT Short Term Goal 1 (Week 1): Pt will complete toilet transfers with Supervision OT Short Term Goal 2 (Week 1): Pt will complete 2 grooming tasks in standing to demonstrate increased activity tolerance OT Short Term Goal 3 (Week 1): Pt will utilize LUE at diminished level during self-care tasks with min cues OT Short Term Goal 4 (Week 1): Pt will complete simple meal prep activity with supervision  Skilled Therapeutic Interventions/Progress Updates:    Treatment session with focus on functional mobility and safety in home environment.  Pt received upright in recliner reporting fatigue but willing to engage in therapy session.  Pt reports majority of her cooking is done either in the crockpot or microwave and that she will occasionally scramble an egg, but does not do much stove top or oven cooking.  Discussed energy conservation strategies and continued use of crockpot as an energy conservation technique.  Engaged in changing sheets on ADL apt bed with focus on dynamic standing balance and endurance.  Pt completed mobility in apt with RW with CGA to close supervision.  Pt with 2 LOB while putting fitted sheet on bed, but able to demonstrate safe strategy by leaning against bed to stabilize self.  Pt was able to complete task without seated rest break.  Returned to room and transferred to recliner and left upright with lunch tray in place and all needs in reach.  Therapy Documentation Precautions:  Precautions Precautions: Fall Precaution Comments: ataxic Restrictions Weight Bearing Restrictions: No Pain: Pain Assessment Pain Scale: 0-10 Pain Score: 6  Pain Type: Chronic pain Pain Location: Neck Pain Orientation: Posterior Pain Radiating  Towards: head Pain Descriptors / Indicators: Aching;Constant;Discomfort Pain Frequency: Constant Pain Onset: On-going Patients Stated Pain Goal: 2 Pain Intervention(s): Medication (See eMAR)   Therapy/Group: Individual Therapy  Simonne Come 04/29/2018, 12:18 PM

## 2018-04-29 NOTE — Progress Notes (Signed)
[place] PHYSICAL MEDICINE & REHABILITATION PROGRESS NOTE   Subjective/Complaints:  Patient doing well overnight.  Working with therapy today.  ROS- denies CP, SOB, N/V/D  Objective:   No results found. Recent Labs    04/28/18 0633  WBC 6.2  HGB 10.1*  HCT 35.2*  PLT 171   Recent Labs    04/28/18 0633  NA 141  K 3.9  CL 109  CO2 22  GLUCOSE 97  BUN 15  CREATININE 0.82  CALCIUM 7.9*    Intake/Output Summary (Last 24 hours) at 04/29/2018 0900 Last data filed at 04/29/2018 4580 Gross per 24 hour  Intake 720 ml  Output -  Net 720 ml     Physical Exam: Vital Signs Blood pressure (!) 153/75, pulse 69, temperature 98.1 F (36.7 C), temperature source Oral, resp. rate 18, height 5\' 5"  (1.651 m), weight 57.3 kg, SpO2 95 %.     Assessment/Plan: 1. Functional deficits secondary to left-sided weaknesssecondary to subcortical/brainstem CVA which require 3+ hours per day of interdisciplinary therapy in a comprehensive inpatient rehab setting.  Physiatrist is providing close team supervision and 24 hour management of active medical problems listed below.  Physiatrist and rehab team continue to assess barriers to discharge/monitor patient progress toward functional and medical goals  Care Tool:  Bathing    Body parts bathed by patient: Right arm, Left arm, Chest, Abdomen, Front perineal area, Face         Bathing assist Assist Level: Moderate Assistance - Patient 50 - 74%     Upper Body Dressing/Undressing Upper body dressing   What is the patient wearing?: Pull over shirt    Upper body assist Assist Level: Independent    Lower Body Dressing/Undressing Lower body dressing      What is the patient wearing?: Pants, Underwear/pull up     Lower body assist Assist for lower body dressing: Independent     Toileting Toileting    Toileting assist Assist for toileting: Contact Guard/Touching assist     Transfers Chair/bed transfer  Transfers  assist     Chair/bed transfer assist level: Minimal Assistance - Patient > 75%     Locomotion Ambulation   Ambulation assist      Assist level: Minimal Assistance - Patient > 75% Assistive device: Walker-rolling Max distance: 200   Walk 10 feet activity   Assist     Assist level: Minimal Assistance - Patient > 75% Assistive device: Walker-rolling   Walk 50 feet activity   Assist    Assist level: Minimal Assistance - Patient > 75% Assistive device: Walker-standard    Walk 150 feet activity   Assist    Assist level: Minimal Assistance - Patient > 75% Assistive device: Walker-rolling    Walk 10 feet on uneven surface  activity   Assist     Assist level: Minimal Assistance - Patient > 75% Assistive device: Aeronautical engineer Will patient use wheelchair at discharge?: No Type of Wheelchair: Manual    Wheelchair assist level: Minimal Assistance - Patient > 75% Max wheelchair distance: 100'    Wheelchair 50 feet with 2 turns activity    Assist        Assist Level: Minimal Assistance - Patient > 75%   Wheelchair 150 feet activity     Assist Wheelchair 150 feet activity did not occur: Safety/medical concerns          Medical Problem List and Plan: 1.Ataxic gait with left-sided weaknesssecondary to  subcortical/brainstem CVA. MRI not completed due to pacemaker CIR PT, OT evals today -plan for follow up with vascular surgery as outpt after discharge to discuss potential CEA 2. DVT Prophylaxis/Anticoagulation: Eliquis 3. Pain Management:Neurontin 300 mg daily at 600 mg daily at bedtime 4. Mood:Provide emotional support 5. Neuropsych: This patientiscapable of making decisions on herown behalf. 6. Skin/Wound Care:Routine skin checks 7. Fluids/Electrolytes/Nutrition:Routine in and out's with follow-up chemistries 8. Atrial fibrillation with history of pacemaker. Continue  amiodarone 200 mg daily. -on Chronic Eliquis Was on pindolol at home will monitor HR Vitals:   04/28/18 1322 04/28/18 1925  BP: (!) 111/50 (!) 153/75  Pulse: 80 69  Resp: 18 18  Temp: 98.5 F (36.9 C) 98.1 F (36.7 C)  SpO2: 97% 95%  Blood pressure fluctuating, heart rate is controlled 9. Diastolic congestive heart failure. Monitor for any signs of fluid overload 10. Hypothyroidism. Synthroid 11. Hyperlipidemia. Lipitor 12. Diet controlled diabetes mellitus. Blood sugar checks discontinuedas sugars well controlled    13. Constipation. Laxative assistance  14.  Insomnia - took "40mg  Melatonin" at home but didn't help, trial trazodone, will monitor effect  LOS: 2 days A FACE TO FACE EVALUATION WAS PERFORMED  Charlett Blake 04/29/2018, 9:00 AM

## 2018-04-29 NOTE — Patient Care Conference (Signed)
Inpatient RehabilitationTeam Conference and Plan of Care Update Date: 04/29/2018   Time: 10:30 AM    Patient Name: Meredith Gonzalez      Medical Record Number: [id number]  Date of Birth: February 24, 1936 Sex: Female         Room/Bed: 4W22C/4W22C-01 Payor Info: Payor: MEDICARE / Plan: MEDICARE PART A AND B / Product Type: *No Product type* /    Admitting Diagnosis: R CVA  Admit Date/Time:  04/27/2018  3:35 PM Admission Comments: No comment available   Primary Diagnosis:  <principal problem not specified> Principal Problem: <principal problem not specified>  Patient Active Problem List   Diagnosis Date Noted  . Subcortical infarction ([place]) 04/27/2018  . CVA (cerebral vascular accident) ([place]) 04/26/2018  . TIA (transient ischemic attack) 04/24/2018  . CAD (coronary artery disease) 08/07/2016  . (HFpEF) heart failure with preserved ejection fraction ([place]) 08/07/2016  . Ischemic cardiomyopathy 08/07/2016  . Chest pain   . NSTEMI (non-ST elevated myocardial infarction) ([place])   . Status post coronary artery stent placement   . Abnormal stress test   . SOB (shortness of breath)   . Multifocal pneumonia 07/13/2016  . Elevated troponin 07/13/2016  . Leukocytosis   . Pacemaker 05/17/2014  . Complications, pacemaker cardiac, mechanical 12/31/2012  . Edema extremities 10/08/2012  . Persistent atrial fibrillation 10/08/2012  . Hypertension 10/08/2012  . Hypothyroidism 10/08/2012  . Neuropathy 10/08/2012  . Diabetes ([place]) 10/08/2012  . Depression 09/08/2012  . Anemia 08/08/2012  . Knee fracture, left 08/07/2012  . Recurrent falls 08/07/2012  . Syncope 08/07/2012  . SDH (subdural hematoma) ([place]) 08/07/2012    Expected Discharge Date: Expected Discharge Date: 05/07/18  Team Members Present: Physician leading conference: Dr. Alysia Penna Social Worker Present: Ovidio Kin, LCSW Nurse Present: Dorthula Nettles, RN PT Present: Lavone Nian, PT OT Present: Simonne Come, OT SLP Present:  Stormy Fabian, SLP PPS Coordinator present : Gunnar Fusi     Current Status/Progress Goal Weekly Team Focus  Medical   Blood pressure fluctuating, sleep is poor but this is chronic.  Do secondary stroke risk, reduce fall risk  Initiate rehab program   Bowel/Bladder   Pt is continent B/B. LBM 04/28/2018.  Remain continent B/B. Maintain regular bowel pattern.  Assist with toileting neeeds PRN.   Swallow/Nutrition/ Hydration             ADL's   Min assist transfers with RW, Min assist UB dressing, Min-Mod assist LB bathing/dressing without AD  Mod I goals  ADL retraining, dynamic standing balance, vision, LUE NMR   Mobility   min assist overall with RW, supervision bed mobility  mod I with LRAD in the home, min assist floor transfer & supervision for stair negotiation  transfers, gait, balance, pt education & d/c planning, floor transfer   Communication             Safety/Cognition/ Behavioral Observations            Pain   Pt complains of chronic pain of 6 to shoulders. Pain managed by Tylenol and heating packs.  Pain < 3  Assess pain Q shift and PRN. Treat per orders.   Skin   Patient has abrasions to R and L ankles.  Maintain skin integrity and prevent skin breakdown. Treat skin issues per orders.  Assess skin Q shift and PRN.       *See Care Plan and progress notes for long and short-term goals.     Barriers to Discharge  Current Status/Progress Possible Resolutions  Date Resolved   Physician    Medical stability     Initiating program  Continue CIR PT OT      Nursing                  PT  Decreased caregiver support  lives alone (son works)              OT Decreased caregiver support                SLP                SW Decreased caregiver support Does not have 24 hr care            Discharge Planning/Teaching Needs:  Pt hopes to go back to her independent living home with her son coming by to check on her. Goals are mod/i so should be able too      Team  Discussion:  Goals mod/i-supervision level. Currently min assist level. BERG 25/56. MD started on trazodone for sleep slept well last night. Chronic pain in shoulders heat packs and tylenol is helping. Balance issues getting better and pacemaker monitor changed to new one. Should be able to return to home mod/i level  Revisions to Treatment Plan:  DC 2/13    Continued Need for Acute Rehabilitation Level of Care: The patient requires daily medical management by a physician with specialized training in physical medicine and rehabilitation for the following conditions: Daily direction of a multidisciplinary physical rehabilitation program to ensure safe treatment while eliciting the highest outcome that is of practical value to the patient.: Yes Daily medical management of patient stability for increased activity during participation in an intensive rehabilitation regime.: Yes Daily analysis of laboratory values and/or radiology reports with any subsequent need for medication adjustment of medical intervention for : Neurological problems   I attest that I was present, lead the team conference, and concur with the assessment and plan of the team.   Elease Hashimoto 04/29/2018, 12:50 PM

## 2018-04-29 NOTE — Progress Notes (Signed)
Occupational Therapy Session Note  Patient Details  Name: Logyn Dedominicis MRN: [id number] Date of Birth: 07-17-1935  Today's Date: 04/29/2018 OT Individual Time: 1000-1100 OT Individual Time Calculation (min): 60 min    Short Term Goals: Week 1:  OT Short Term Goal 1 (Week 1): Pt will complete toilet transfers with Supervision OT Short Term Goal 2 (Week 1): Pt will complete 2 grooming tasks in standing to demonstrate increased activity tolerance OT Short Term Goal 3 (Week 1): Pt will utilize LUE at diminished level during self-care tasks with min cues OT Short Term Goal 4 (Week 1): Pt will complete simple meal prep activity with supervision  Skilled Therapeutic Interventions/Progress Updates:    OT intervention with focus on functional amb with RW, standing balance, forced LUE use, safety awareness, and activity tolerance to increase independence with BADLs.  Pt resting in recliner upon arrival and agreeable to participating in therapy.  Pt requires CGA for functional amb with RW.  Pt engaged in standing tasks on compliant/noncompliant surface to retrieve cards from card board with LUE.  Pt required CGA when standing on compliant surface.  Pt engaged in multiple sit<>stand to retrieve and replace cards on card board. Pt amb to ADL apt and back to gym. Pt returned to room and remained in recliner with belt alarm activated and all needs withn reach.  Therapy Documentation Precautions:  Precautions Precautions: Fall Precaution Comments: ataxic Restrictions Weight Bearing Restrictions: No   Pain:  Pt denies pain this morning.   Therapy/Group: Individual Therapy  Leroy Libman 04/29/2018, 2:00 PM

## 2018-04-29 NOTE — Progress Notes (Signed)
Physical Therapy Session Note  Patient Details  Name: Meredith Gonzalez MRN: [id number] Date of Birth: [birth year]  Today's Date: 04/29/2018 PT Individual Time: 0805-0918 PT Individual Time Calculation (min): 73 min   Short Term Goals: Week 1:  PT Short Term Goal 1 (Week 1): Patient to demonstrate transfers consistently supervision. PT Short Term Goal 2 (Week 1): Patient to ambulate with CGA with RW 200'. PT Short Term Goal 3 (Week 1): Patient to perform household mobility with CGA with RW PT Short Term Goal 4 (Week 1): Patient to negotiate 4 steps with rail CGA.  Skilled Therapeutic Interventions/Progress Updates:   Pt received in bed and agreeable to tx. Pt completes upper body and lower body dressing with supervision and dons shoes with assistance from therapist. Pt ambulates to sink with RW and min assist and combs hair and washes face with CGA. Pt wheeled down to gym for time management. Pt ambulates with RW and min assist 50 ft + 150 ft with min assist and 2-3 instances of loss of balance due to L knee buckling. Berg Balance Test was assessed and pt scored a 25/56, indicating that pt is at a high risk for falls. Pt was educated on results of balance test, and pt voices understanding. Pt states that her RW feels too low and therapist reassesses walker height. Pt demonstrates improved posture with adjusted RW height and states that it "feels much better". Pt left sitting up in recliner with chair alarm donned and all needs in reach.   Pt c/o neck pain but states it is ongoing and already received medication. Rest breaks were provided throughout session.  Therapy Documentation Precautions:  Precautions Precautions: Fall Precaution Comments: ataxic Restrictions Weight Bearing Restrictions: No Balance: Standardized Balance Assessment Standardized Balance Assessment: Berg Balance Test Berg Balance Test Sit to Stand: Able to stand using hands after several tries Standing Unsupported: Able to  stand 30 seconds unsupported Sitting with Back Unsupported but Feet Supported on Floor or Stool: Able to sit safely and securely 2 minutes Stand to Sit: Uses backs of legs against chair to control descent Transfers: Able to transfer with verbal cueing and /or supervision Standing Unsupported with Eyes Closed: Able to stand 3 seconds Standing Ubsupported with Feet Together: Able to place feet together independently but unable to hold for 30 seconds From Standing, Reach Forward with Outstretched Arm: Reaches forward but needs supervision From Standing Position, Pick up Object from Floor: Able to pick up shoe, needs supervision From Standing Position, Turn to Look Behind Over each Shoulder: Turn sideways only but maintains balance Turn 360 Degrees: Able to turn 360 degrees safely but slowly Standing Unsupported, Alternately Place Feet on Step/Stool: Needs assistance to keep from falling or unable to try Standing Unsupported, One Foot in Front: Needs help to step but can hold 15 seconds Standing on One Leg: Unable to try or needs assist to prevent fall Total Score: 25   Therapy/Group: Individual Therapy  Aralyn Nowak 04/29/2018, 12:24 PM

## 2018-04-30 ENCOUNTER — Inpatient Hospital Stay (HOSPITAL_COMMUNITY): Payer: Medicare Other

## 2018-04-30 ENCOUNTER — Inpatient Hospital Stay (HOSPITAL_COMMUNITY): Payer: Medicare Other | Admitting: Occupational Therapy

## 2018-04-30 MED ORDER — CAMPHOR-MENTHOL 0.5-0.5 % EX LOTN
TOPICAL_LOTION | CUTANEOUS | Status: DC | PRN
  Administered 2018-04-30: 1 via TOPICAL
  Administered 2018-05-01 (×2): via TOPICAL
  Filled 2018-04-30: qty 222

## 2018-04-30 NOTE — Progress Notes (Signed)
Occupational Therapy Session Note  Patient Details  Name: Meredith Gonzalez MRN: [id number] Date of Birth: 05/03/1935  Today's Date: 04/30/2018 OT Individual Time: 1130-1200 OT Individual Time Calculation (min): 30 min    Short Term Goals: Week 1:  OT Short Term Goal 1 (Week 1): Pt will complete toilet transfers with Supervision OT Short Term Goal 2 (Week 1): Pt will complete 2 grooming tasks in standing to demonstrate increased activity tolerance OT Short Term Goal 3 (Week 1): Pt will utilize LUE at diminished level during self-care tasks with min cues OT Short Term Goal 4 (Week 1): Pt will complete simple meal prep activity with supervision  Skilled Therapeutic Interventions/Progress Updates:    1:1 Ambulated with RW from room to gym. Continues to assess vision and continues to report diplopia in all fields in near and far fields. Pt perform clothespin task in standing on compliant surface while reaching slightly behind her to reach down for clothespin and return to standing at midline to put it on rung (to both sides). Pt requires min A for dynamic standing tasks. Trial of Rollator back to room with contact guard to min A with fatigue.   Therapy Documentation Precautions:  Precautions Precautions: Fall Precaution Comments: ataxic Restrictions Weight Bearing Restrictions: No General:   Vital Signs: Therapy Vitals Temp: 97.9 F (36.6 C) Pulse Rate: 68 Resp: 16 BP: (!) 128/54 Patient Position (if appropriate): Sitting Oxygen Therapy SpO2: 98 % O2 Device: Room Air Pain: Reports ongoing neck pain- applies heat for comfort and rests prn  Therapy/Group: Individual Therapy  Meredith Gonzalez [facility] 04/30/2018, 4:47 PM

## 2018-04-30 NOTE — Progress Notes (Addendum)
Physical Therapy Session Note  Patient Details  Name: Meredith Gonzalez MRN: [id number] Date of Birth: Aug 16, 1935  Today's Date: 04/30/2018 PT Individual Time: 1300-1400 PT Individual Time Calculation (min): 60 min   Short Term Goals: Week 1:  PT Short Term Goal 1 (Week 1): Patient to demonstrate transfers consistently supervision. PT Short Term Goal 2 (Week 1): Patient to ambulate with CGA with RW 200'. PT Short Term Goal 3 (Week 1): Patient to perform household mobility with CGA with RW PT Short Term Goal 4 (Week 1): Patient to negotiate 4 steps with rail CGA.  Skilled Therapeutic Interventions/Progress Updates:   Pt seated in recliner.  She stated her neck pain was 6/10, premedicated.  She had K pad on her neck and stated it was helpful.  Stand pivot transfer with RW recliner> w/c iwht min guard assist.  Pt is fearful of forward wt shift.  PT placed patch over pt's R eye due to c/o of diplopia.  Gait training ' through obstacle course of 9 cones, using RW without encountering any cones, x 2, min guard assist, 50' total.  Sustained stretch and balance challenge, standing with forefeet on wedge, with bil UE support x 1 minute, with 1UE support x 1 minute, without UE support x 30 seconds.  Without UE support, pt had immediate anterior sway without correction.  (She stated that her last fall was due to falling forward over her Rollator as she was adjusting her pants in standing.)  With very minimal external perturbations in standing, anterior and posterior, pt demonstrated no balance strategies, and began to lose balance forward, requiring assist to regain.    Stand pivot transfer with RW to return to recliner.  Pt left resting in recliner with needs at hand, bil LEs elevated, and K pad on neck.      Therapy Documentation Precautions:  Precautions Precautions: Fall Precaution Comments: ataxic Restrictions Weight Bearing Restrictions: No     Therapy/Group: Individual  Therapy  Zaron Zwiefelhofer 04/30/2018, 4:51 PM

## 2018-04-30 NOTE — Progress Notes (Signed)
[facility] PHYSICAL MEDICINE & REHABILITATION PROGRESS NOTE   Subjective/Complaints:  No issues overnite except poor sleep pleased about tent d/c date next wk  ROS- denies CP, SOB, N/V/D  Objective:   No results found. Recent Labs    04/28/18 0633  WBC 6.2  HGB 10.1*  HCT 35.2*  PLT 171   Recent Labs    04/28/18 0633  NA 141  K 3.9  CL 109  CO2 22  GLUCOSE 97  BUN 15  CREATININE 0.82  CALCIUM 7.9*    Intake/Output Summary (Last 24 hours) at 04/30/2018 0916 Last data filed at 04/29/2018 1700 Gross per 24 hour  Intake 480 ml  Output -  Net 480 ml     Physical Exam: Vital Signs Blood pressure (!) 124/53, pulse 68, temperature 97.6 F (36.4 C), temperature source Oral, resp. rate 12, height 5\' 5"  (1.651 m), weight 58.9 kg, SpO2 96 %.   General: No acute distress Mood and affect are appropriate Heart: Regular rate and rhythm no rubs murmurs or extra sounds Lungs: Clear to auscultation, breathing unlabored, no rales or wheezes Abdomen: Positive bowel sounds, soft nontender to palpation, nondistended Extremities: No clubbing, cyanosis, or edema Skin: No evidence of breakdown, no evidence of rash Neurologic: Cranial nerves II through XII intact, motor strength is 5/5 in right  And 4/5 left deltoid, bicep, tricep, grip, hip flexor, knee extensors, ankle dorsiflexor and plantar flexor Sensory exam normal sensation to light touch and proprioception in bilateral upper and lower extremities Cerebellar exam normal finger to nose to finger as well as heel to shin in Right  upper and lower extremities, Left side with dysmetria Musculoskeletal: Full range of motion in all 4 extremities. No joint swelling   Assessment/Plan: 1. Functional deficits secondary to left-sided weaknesssecondary to subcortical/brainstem CVA which require 3+ hours per day of interdisciplinary therapy in a comprehensive inpatient rehab setting.  Physiatrist is providing close team supervision and 24  hour management of active medical problems listed below.  Physiatrist and rehab team continue to assess barriers to discharge/monitor patient progress toward functional and medical goals  Care Tool:  Bathing    Body parts bathed by patient: Right arm, Left arm, Chest, Abdomen, Front perineal area, Face         Bathing assist Assist Level: Moderate Assistance - Patient 50 - 74%     Upper Body Dressing/Undressing Upper body dressing   What is the patient wearing?: Pull over shirt    Upper body assist Assist Level: Independent    Lower Body Dressing/Undressing Lower body dressing      What is the patient wearing?: Pants, Underwear/pull up     Lower body assist Assist for lower body dressing: Independent     Toileting Toileting    Toileting assist Assist for toileting: Supervision/Verbal cueing     Transfers Chair/bed transfer  Transfers assist     Chair/bed transfer assist level: Contact Guard/Touching assist     Locomotion Ambulation   Ambulation assist      Assist level: Minimal Assistance - Patient > 75% Assistive device: Walker-rolling Max distance: 150 ft   Walk 10 feet activity   Assist     Assist level: Minimal Assistance - Patient > 75% Assistive device: Walker-rolling   Walk 50 feet activity   Assist    Assist level: Minimal Assistance - Patient > 75% Assistive device: Walker-rolling    Walk 150 feet activity   Assist    Assist level: Minimal Assistance - Patient >  75% Assistive device: Walker-rolling    Walk 10 feet on uneven surface  activity   Assist     Assist level: Minimal Assistance - Patient > 75% Assistive device: Aeronautical engineer Will patient use wheelchair at discharge?: No Type of Wheelchair: Manual    Wheelchair assist level: Minimal Assistance - Patient > 75% Max wheelchair distance: 100'    Wheelchair 50 feet with 2 turns activity    Assist        Assist  Level: Minimal Assistance - Patient > 75%   Wheelchair 150 feet activity     Assist Wheelchair 150 feet activity did not occur: Safety/medical concerns          Medical Problem List and Plan: 1.Ataxic gait with left-sided weaknesssecondary to subcortical/brainstem CVA. MRI not completed due to pacemaker CIR PT, OT tent d/c 2/13 -plan for follow up with vascular surgery as outpt after discharge to discuss potential CEA 2. DVT Prophylaxis/Anticoagulation: Eliquis 3. Pain Management:Neurontin 300 mg daily at 600 mg daily at bedtime 4. Mood:Provide emotional support 5. Neuropsych: This patientiscapable of making decisions on herown behalf. 6. Skin/Wound Care:Routine skin checks 7. Fluids/Electrolytes/Nutrition:Routine in and out's with follow-up chemistries I: improved to 724ml on 2/5 8. Atrial fibrillation with history of pacemaker. Continue amiodarone 200 mg daily. -on Chronic Eliquis Was on pindolol at home will monitor HR Vitals:   04/29/18 2033 04/30/18 0538  BP: 140/62 (!) 124/53  Pulse: 66 68  Resp: 16 12  Temp: 98.2 F (36.8 C) 97.6 F (36.4 C)  SpO2: 98% 96%  HR and BP controlled 2/6 9. Diastolic congestive heart failure. Monitor for any signs of fluid overload 10. Hypothyroidism. Synthroid 11. Hyperlipidemia. Lipitor 12. Diet controlled diabetes mellitus. Blood sugar checks discontinuedas sugars well controlled    13. Constipation. Laxative assistance  14.  Insomnia - took "40mg  Melatonin" at home but didn't help, trial trazodone, will monitor effect  LOS: 3 days A FACE TO FACE EVALUATION WAS PERFORMED  Charlett Blake 04/30/2018, 9:16 AM

## 2018-04-30 NOTE — Progress Notes (Signed)
Occupational Therapy Session Note  Patient Details  Name: Meredith Gonzalez MRN: [id number] Date of Birth: 10-07-1935  Today's Date: 04/30/2018 OT Individual Time: 1820-1906 OT Individual Time Calculation (min): 46 min    Skilled Therapeutic Interventions/Progress Updates: Patient very pleasant and appeared to enjoy building rapport, but she required max reminders to complete therapy while talking.    Thoough seated for all activities, she required many rest breaks but completed activitiy tolerance as follows:  Visual perceptual work (she complained of either double vision or 1.5 items present) Crossing midline and brain gym with reciprocal upper and lower extremity movmenets  Endurance activities seated  She was left seated in her recliner with call bell and phone within reach.  Continue working toward [employer] of Care     Therapy Documentation Precautions:  Precautions Precautions: Fall Precaution Comments: ataxic Restrictions Weight Bearing Restrictions: No General: General OT Amount of Missed Time: 14 Minutes(14)  Denied pain  Therapy/Group: Individual Therapy  Alfredia Ferguson [facility] 04/30/2018, 9:08 PM

## 2018-04-30 NOTE — Progress Notes (Signed)
Occupational Therapy Session Note  Patient Details  Name: Meredith Gonzalez MRN: [id number] Date of Birth: 1936/03/07  Today's Date: 04/30/2018 OT Individual Time: 1000-1105 OT Individual Time Calculation (min): 65 min    Short Term Goals: Week 1:  OT Short Term Goal 1 (Week 1): Pt will complete toilet transfers with Supervision OT Short Term Goal 2 (Week 1): Pt will complete 2 grooming tasks in standing to demonstrate increased activity tolerance OT Short Term Goal 3 (Week 1): Pt will utilize LUE at diminished level during self-care tasks with min cues OT Short Term Goal 4 (Week 1): Pt will complete simple meal prep activity with supervision  Skilled Therapeutic Interventions/Progress Updates:    Treatment session with focus on ADL retraining, functional mobility, and dynamic standing balance.  Pt received supine in bed expressing desire to shower.  Pt ambulate to room shower with RW and CGA.  Pt doffed clothing and transferred in to shower with CGA.  Therapist issued pt a long handled sponge, pt then able to wash back and lower legs and feet.  Pt very appreciative of sponge.  Pt reports one spell of lightheadedness during bathing when bending over to wash perineal area.  Educated on use of seat for safety as well as leaving the door cracked for increased air circulation.  Pt completed shower at overall CGA.  Dressing completed at sit > stand level from toilet, this is pt typical routine at home.  Therapist provided pt with long handled shoe horn to assist with donning shoes, still required assistance to don socks.  Engaged in grooming tasks in standing at sink with close supervision and alternating UE support on sink.  Pt left upright in recliner with all needs in reach.  Therapy Documentation Precautions:  Precautions Precautions: Fall Precaution Comments: ataxic Restrictions Weight Bearing Restrictions: No Pain:  Pt with c/o neck pain.  Reapplied k-pad at end of session.   Therapy/Group:  Individual Therapy  Simonne Come 04/30/2018, 12:36 PM

## 2018-04-30 NOTE — IPOC Note (Signed)
Overall Plan of Care [facility]) Patient Details Name: Meredith Gonzalez MRN: [id number] DOB: 09-08-1935  Admitting Diagnosis: <principal problem not specified>  Hospital Problems: Active Problems:   Subcortical infarction [facility])     Functional Problem List: Nursing Bladder, Bowel, Medication Management, Nutrition, Pain, Safety, Skin Integrity, Sensory  PT Balance, Safety, Endurance, Motor  OT Balance, Endurance, Motor, Pain, Safety, Sensory, Skin Integrity, Vision  SLP    TR         Basic ADL's: OT Eating, Bathing, Dressing, Grooming, Toileting     Advanced  ADL's: OT Simple Meal Preparation, Light Housekeeping     Transfers: PT Bed Mobility, Furniture, Bed to Chair, Teacher, early years/pre, Metallurgist: PT Ambulation, Data processing manager, Emergency planning/management officer     Additional Impairments: OT Fuctional Use of Upper Extremity  SLP        TR      Anticipated Outcomes Item Anticipated Outcome  Self Feeding Mod I  Swallowing      Basic self-care  Mod I  Toileting  Mod I   Bathroom Transfers Mod I  Bowel/Bladder     Transfers  mod I  Locomotion  mod I  Communication     Cognition     Pain     Safety/Judgment      Therapy Plan: PT Intensity: Minimum of 1-2 x/day ,45 to 90 minutes PT Frequency: 5 out of 7 days PT Duration Estimated Length of Stay: 10-14 OT Intensity: Minimum of 1-2 x/day, 45 to 90 minutes OT Frequency: 5 out of 7 days OT Duration/Estimated Length of Stay: 10-12 days      Team Interventions: Nursing Interventions Patient/Family Education, Disease Management/Prevention, Medication Management, Skin Care/Wound Management  PT interventions Ambulation/gait training, Training and development officer, DME/adaptive equipment instruction, Functional electrical stimulation, Functional mobility training, Neuromuscular re-education, Patient/family education, Stair training, Therapeutic Activities, Therapeutic Exercise, UE/LE Strength taining/ROM, UE/LE Coordination  activities, Wheelchair propulsion/positioning  OT Interventions Training and development officer, Cognitive remediation/compensation, Community reintegration, Discharge planning, Disease mangement/prevention, Engineer, drilling, Functional mobility training, Neuromuscular re-education, Pain management, Patient/family education, Psychosocial support, Self Care/advanced ADL retraining, Therapeutic Activities, Therapeutic Exercise, UE/LE Strength taining/ROM, UE/LE Coordination activities, Visual/perceptual remediation/compensation  SLP Interventions    TR Interventions    SW/CM Interventions Discharge Planning, Psychosocial Support, Patient/Family Education   Barriers to Discharge MD  Medical stability  Nursing      PT Decreased caregiver support lives alone (son works)  OT Decreased caregiver support    SLP      SW Decreased caregiver support Does not have 24 hr care   Team Discharge Planning: Destination: PT-Home ,OT- Home , SLP-Home Projected Follow-up: PT-Home health PT, OT-  Home health OT, Outpatient OT, SLP-None Projected Equipment Needs: PT-None recommended by PT, OT- None recommended by OT, SLP-None recommended by SLP Equipment Details: PT- , OT-  Patient/family involved in discharge planning: PT- Patient,  OT-Patient, SLP-Patient  MD ELOS: 10-12d Medical Rehab Prognosis:  Good Assessment:  83 year old right handed female history of diet-controlled diabetes mellitus, CAD, CVA in 2000, hypertension, diastolic congestive heart failure, atrial fibrillation with pacemaker maintained on Eliquis. Per chart review patient lives alone at [facility] independent living facility in [place]. Independent with assistive device. She has a son that lives in [place]. Presented 04/24/2018 with left-sided weakness and ataxia. Cranial CT scan negative. Patient did not receive TPA. CT perfusion scan as well as angiogram of head and neck showed  moderate to severe 60-70% right carotid bifurcation stenosis. Follow-up renal  CT scan to 04/13/2018 negative. MRI not completed due to pacemaker. Echocardiogram with ejection fraction of 24% normal systolic function. Vascular surgery Dr. Donnetta Hutching follow-up for carotid stenosis plan right carotid enterectomy after recovery of CVA to follow-up as outpatient. Patient remains on Eliquis as prior to admission for CVA prophylaxis as well as history of atrial fibrillation.  Now requiring 24/7 Rehab RN,MD, as well as CIR level PT, OT and SLP.  Treatment team will focus on ADLs and mobility with goals set at Mod I See Team Conference Notes for weekly updates to the plan of care

## 2018-05-01 ENCOUNTER — Inpatient Hospital Stay (HOSPITAL_COMMUNITY): Payer: Medicare Other | Admitting: Physical Therapy

## 2018-05-01 ENCOUNTER — Inpatient Hospital Stay (HOSPITAL_COMMUNITY): Payer: Medicare Other | Admitting: Occupational Therapy

## 2018-05-01 ENCOUNTER — Inpatient Hospital Stay (HOSPITAL_COMMUNITY): Payer: Medicare Other

## 2018-05-01 DIAGNOSIS — F5101 Primary insomnia: Secondary | ICD-10-CM

## 2018-05-01 DIAGNOSIS — M542 Cervicalgia: Secondary | ICD-10-CM

## 2018-05-01 DIAGNOSIS — I10 Essential (primary) hypertension: Secondary | ICD-10-CM

## 2018-05-01 MED ORDER — TRAZODONE HCL 50 MG PO TABS: 25.0000 mg | ORAL_TABLET | Freq: Every evening | ORAL | Status: DC | PRN

## 2018-05-01 MED ORDER — METHOCARBAMOL 500 MG PO TABS
500.0000 mg | ORAL_TABLET | Freq: Four times a day (QID) | ORAL | Status: DC | PRN
  Administered 2018-05-01: 500 mg via ORAL
  Filled 2018-05-01: qty 1

## 2018-05-01 MED ORDER — SIMETHICONE 80 MG PO CHEW
160.0000 mg | CHEWABLE_TABLET | Freq: Four times a day (QID) | ORAL | Status: DC | PRN
  Administered 2018-05-01 (×2): 160 mg via ORAL
  Filled 2018-05-01 (×2): qty 2

## 2018-05-01 MED ORDER — METHOCARBAMOL 500 MG PO TABS
250.0000 mg | ORAL_TABLET | Freq: Three times a day (TID) | ORAL | Status: DC | PRN
  Administered 2018-05-02 – 2018-05-04 (×3): 250 mg via ORAL
  Filled 2018-05-01 (×5): qty 1

## 2018-05-01 MED ORDER — TRAZODONE HCL 50 MG PO TABS
25.0000 mg | ORAL_TABLET | Freq: Every day | ORAL | Status: DC
  Administered 2018-05-01 – 2018-05-06 (×5): 25 mg via ORAL
  Filled 2018-05-01 (×5): qty 1

## 2018-05-01 MED ORDER — METHOCARBAMOL 500 MG PO TABS: 250.0000 mg | ORAL_TABLET | Freq: Four times a day (QID) | ORAL | Status: DC | PRN

## 2018-05-01 NOTE — Progress Notes (Signed)
Occupational Therapy Session Note  Patient Details  Name: Meredith Gonzalez MRN: [id number] Date of Birth: [birth year]  Today's Date: 05/01/2018 OT Individual Time: 0700-0725 OT Individual Time Calculation (min): 25 min    Short Term Goals: Week 1:  OT Short Term Goal 1 (Week 1): Pt will complete toilet transfers with Supervision OT Short Term Goal 2 (Week 1): Pt will complete 2 grooming tasks in standing to demonstrate increased activity tolerance OT Short Term Goal 3 (Week 1): Pt will utilize LUE at diminished level during self-care tasks with min cues OT Short Term Goal 4 (Week 1): Pt will complete simple meal prep activity with supervision  Skilled Therapeutic Interventions/Progress Updates:    1;1. Pt received in recliner with no c/o pain and ready to go. Pt declines BADLs at this time reuesting to work on balance. Pt ambulates with rolator to/from all tx destinations with CGA increaseing to min A for fatigue. Pt requires VC for brake management, continued ambulation when talking and keeping rolator closer to body when fatigued. Pt completes 3x10 standing ball tosses to rebounder trampoline with CGA for standing balance when catching ball. Pt given reacher to use when in recliner in room to reach items on floor. Exited session with pt seated in recliner, call light in reach and all needs met.  Therapy Documentation Precautions:  Precautions Precautions: Fall Precaution Comments: ataxic Restrictions Weight Bearing Restrictions: No General:   Vital Signs: Therapy Vitals Pulse Rate: 63 Resp: 16 BP: 140/70 Patient Position (if appropriate): Standing Oxygen Therapy SpO2: 96 % O2 Device: Room Air Pain:   no pain reported  Therapy/Group: Individual Therapy  Tonny Branch 05/01/2018, 7:26 AM

## 2018-05-01 NOTE — Progress Notes (Signed)
Physical Therapy Session Note  Patient Details  Name: Meredith Gonzalez MRN: [id number] Date of Birth: 10-05-1935  Today's Date: 05/01/2018 PT Individual Time:   Short Term Goals: Week 1:  PT Short Term Goal 1 (Week 1): Patient to demonstrate transfers consistently supervision. PT Short Term Goal 2 (Week 1): Patient to ambulate with CGA with RW 200'. PT Short Term Goal 3 (Week 1): Patient to perform household mobility with CGA with RW PT Short Term Goal 4 (Week 1): Patient to negotiate 4 steps with rail CGA.  Skilled Therapeutic Interventions/Progress Updates:    Pt received in bed asleep but easy to arouse, willing to participate in therapy. Prior to initiation of therapy, checked with RN regarding episode of slurred speech and dizziness during therapy earlier this morning; RN reports suspect symptoms were due to medication and pt is safe to participate in therapy within tolerance. Pt able to don shoes Mod(I) using shoe horn with reported increased difficulty from earlier in morning; increased time. CGA for sit to stand. Upon standing, noted immediate unsteadiness that decreased with standing. Ambulated 30 ft into hallway beginning with min guard, progressing to minA for balance and control of RW as pt's anterior lean increased as she fatigued. Pt propelled w/c with CGA 100 ft to gym; pt struggled with keeping w/c going straight, gave minimal tactile cues to straighten out. Stand pivot transfer to mat table with CGA. Performed exercises: 2x10 bridges, 2x10 supine marches for core strengthening. Attempted quadruped position which made pt very uneasy as she felt unbalanced.  Finished session with standing balance activity: static balance, no UE support with L hand reaching and matching cards. She was able to maintain her balance during this activity and was able to coordinate movements with her left hand to grab the cards. Pushed pt back to room totalA in w/c for time management. Sit to stand transfer  minA at end of session due to fatigue. Left pt in recliner with alarm belt on, needs met.  Therapy Documentation Precautions:  Precautions Precautions: Fall Precaution Comments: ataxic Restrictions Weight Bearing Restrictions: No General: PT Amount of Missed Time (min): 32 Minutes PT Missed Treatment Reason: (medical ) Vital Signs: Therapy Vitals Temp: 97.6 F (36.4 C) Temp Source: Oral Pulse Rate: 72 Resp: 18 BP: (!) 110/51 Patient Position (if appropriate): Standing Oxygen Therapy SpO2: 95 % O2 Device: Room Air Pain: Pain Assessment Pain Scale: 0-10 Pain Score: 0-No pain(Pt reports no neck pain with heating pad)    Therapy/Group: Individual Therapy  Ronnell Guadalajara 05/01/2018, 12:18 PM

## 2018-05-01 NOTE — Progress Notes (Signed)
[place] PHYSICAL MEDICINE & REHABILITATION PROGRESS NOTE   Subjective/Complaints: Complains of gas, poor sleep, and significant neck pain (which is chronic since cervical fractures in September 2019)  ROS: Patient denies fever, rash, sore throat, blurred vision, nausea, vomiting, diarrhea, cough, shortness of breath or chest pain, joint or back pain, headache, or mood change.   Objective:   No results found. No results for input(s): WBC, HGB, HCT, PLT in the last 72 hours. No results for input(s): NA, K, CL, CO2, GLUCOSE, BUN, CREATININE, CALCIUM in the last 72 hours.  Intake/Output Summary (Last 24 hours) at 05/01/2018 0950 Last data filed at 05/01/2018 0524 Gross per 24 hour  Intake 200 ml  Output 1 ml  Net 199 ml     Physical Exam: Vital Signs Blood pressure (!) 110/51, pulse 72, temperature 97.6 F (36.4 C), temperature source Oral, resp. rate 18, height 5\' 5"  (1.651 m), weight 58.9 kg, SpO2 95 %.   Constitutional: No distress . Vital signs reviewed. HEENT: EOMI, oral membranes moist Neck: supple Cardiovascular: RRR without murmur. No JVD    Respiratory: CTA Bilaterally without wheezes or rales. Normal effort    GI: BS +, non-tender, non-distended  Extremities: No clubbing, cyanosis, or edema Skin: No evidence of breakdown, no evidence of rash Neurologic: Cranial nerves II through XII intact, motor 5/5 RUE and RLE. 4 to 4+/5 LUE and LLE. Sensory exam intact to LT and PP. Left dysmetria Musculoskeletal: neck tender to palpation, spasms of traps, splenius capitis, levator scapulae, right more than left Psych: anxious   Assessment/Plan: 1. Functional deficits secondary to left-sided weaknesssecondary to subcortical/brainstem CVA which require 3+ hours per day of interdisciplinary therapy in a comprehensive inpatient rehab setting.  Physiatrist is providing close team supervision and 24 hour management of active medical problems listed below.  Physiatrist and rehab team  continue to assess barriers to discharge/monitor patient progress toward functional and medical goals  Care Tool:  Bathing    Body parts bathed by patient: Right arm, Left arm, Chest, Abdomen, Front perineal area, Face, Buttocks, Right upper leg, Left upper leg, Right lower leg, Left lower leg         Bathing assist Assist Level: Contact Guard/Touching assist     Upper Body Dressing/Undressing Upper body dressing   What is the patient wearing?: Pull over shirt    Upper body assist Assist Level: Set up assist    Lower Body Dressing/Undressing Lower body dressing      What is the patient wearing?: Pants, Underwear/pull up     Lower body assist Assist for lower body dressing: Contact Guard/Touching assist     Toileting Toileting    Toileting assist Assist for toileting: Supervision/Verbal cueing     Transfers Chair/bed transfer  Transfers assist     Chair/bed transfer assist level: Contact Guard/Touching assist     Locomotion Ambulation   Ambulation assist      Assist level: Minimal Assistance - Patient > 75% Assistive device: Walker-rolling Max distance: 50   Walk 10 feet activity   Assist     Assist level: Minimal Assistance - Patient > 75% Assistive device: Walker-rolling   Walk 50 feet activity   Assist    Assist level: Minimal Assistance - Patient > 75% Assistive device: Walker-rolling    Walk 150 feet activity   Assist    Assist level: Minimal Assistance - Patient > 75% Assistive device: Walker-rolling    Walk 10 feet on uneven surface  activity   Assist  Assist level: Minimal Assistance - Patient > 75% Assistive device: Aeronautical engineer Will patient use wheelchair at discharge?: No Type of Wheelchair: Manual    Wheelchair assist level: Minimal Assistance - Patient > 75% Max wheelchair distance: 100'    Wheelchair 50 feet with 2 turns activity    Assist        Assist Level:  Minimal Assistance - Patient > 75%   Wheelchair 150 feet activity     Assist Wheelchair 150 feet activity did not occur: Safety/medical concerns          Medical Problem List and Plan: 1.Ataxic gait with left-sided weaknesssecondary to subcortical/brainstem CVA. MRI not completed due to pacemaker CIR PT, OT tent d/c 2/13 -plan for follow up with vascular surgery as outpt after discharge to discuss potential CEA 2. DVT Prophylaxis/Anticoagulation: Eliquis 3. 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 Will reduce to 250mg  q8 prn and see if this is more efficacious. May not be able to tolerate more than tylenol,gaba 4. Mood:Provide emotional support 5. Neuropsych: This patientiscapable of making decisions on herown behalf. 6. Skin/Wound Care:Routine skin checks 7. Fluids/Electrolytes/Nutrition:Routine in and out's with follow-up chemistries I: improved to 757ml on 2/5 8. Atrial fibrillation with history of pacemaker. Continue amiodarone 200 mg daily. -on Chronic Eliquis Was on pindolol at home will monitor HR Vitals:   05/01/18 0939 05/01/18 0940  BP: (!) 107/49 (!) 110/51  Pulse: 72 72  Resp:    Temp:    SpO2:    HR and BP controlled 2/7 9. Diastolic congestive heart failure. Monitor for any signs of fluid overload 10. Hypothyroidism. Synthroid 11. Hyperlipidemia. Lipitor 12. Diet controlled diabetes mellitus. Blood sugar checks discontinuedas sugars well controlled    13. Constipation. Laxative assistance, moving bowels  -simethicone 160mg  q6 prn for gas  14.  Insomnia - took "40mg  Melatonin" at home but didn't help  -prn trazodone ordered but not given 2/6  -change trazodone to scheduled and add back up dose prn  LOS: 4 days A FACE TO FACE  EVALUATION WAS PERFORMED  Meredith Staggers 05/01/2018, 9:50 AM

## 2018-05-01 NOTE — Progress Notes (Signed)
Physical Therapy Session Note  Patient Details  Name: Meredith Gonzalez MRN: [id number] Date of Birth: Jun 16, 1935  Today's Date: 05/01/2018 PT Individual Time: 9935-7017 PT Individual Time Calculation (min): 27 min   Short Term Goals: Week 1:  PT Short Term Goal 1 (Week 1): Patient to demonstrate transfers consistently supervision. PT Short Term Goal 2 (Week 1): Patient to ambulate with CGA with RW 200'. PT Short Term Goal 3 (Week 1): Patient to perform household mobility with CGA with RW PT Short Term Goal 4 (Week 1): Patient to negotiate 4 steps with rail CGA.  Skilled Therapeutic Interventions/Progress Updates:  Pt received in recliner & agreeable to tx, no c/o pain but pt with heating pad on neck upon PT arrival. Pt transfers sit>stand with min assist and ambulates room>gym with rollator with forward lean, decreased B hip/knee flexion, and decreased dorsiflexion with close supervision<>min assist 2/2 frequent LOB. When ambulating to gym pt with episode of slurred speech and once sitting on EOM pt reports feeling dizzy. BP assessed, see below. Pt with anterior LOB sitting EOM and reports continued feelings of dizziness with more slurred speech. Pt assisted to lying on mat table. PA Linna Hoff) made aware of pt's c/o and behaviors & recommends getting pt back to bed. Pt completes stand pivot mat>w/c and w/c>bed with min assist & was transported to room dependent assist in w/c. Pt left in bed with NT in room assessing vitals.   Sitting: HR = 70 bpm, SpO2 = 95%, BP = 121/57 mmHg (LUE) Standing at 0: HR = 72 bpm, SpO2 = 98%, BP = 107/49 mmHg (LUE) Standing at 1 minute: HR = 72 bpm, SpO2 = 98%, BP = 110/51 mmHg (LUE)  Therapy Documentation Precautions:  Precautions Precautions: Fall Precaution Comments: ataxic Restrictions Weight Bearing Restrictions: No   General: PT Amount of Missed Time (min): 32 Minutes PT Missed Treatment Reason: (medical )    Therapy/Group: Individual Therapy  [name] 05/01/2018, 9:43 AM

## 2018-05-01 NOTE — Progress Notes (Signed)
Occupational Therapy Session Note  Patient Details  Name: Meredith Gonzalez MRN: [id number] Date of Birth: [birth year]  Today's Date: 05/01/2018 OT Individual Time: 1400-1500 OT Individual Time Calculation (min): 60 min   Short Term Goals: Week 1:  OT Short Term Goal 1 (Week 1): Pt will complete toilet transfers with Supervision OT Short Term Goal 2 (Week 1): Pt will complete 2 grooming tasks in standing to demonstrate increased activity tolerance OT Short Term Goal 3 (Week 1): Pt will utilize LUE at diminished level during self-care tasks with min cues OT Short Term Goal 4 (Week 1): Pt will complete simple meal prep activity with supervision  Skilled Therapeutic Interventions/Progress Updates:    Pt greeted seated in recliner and agreeable to OT treatment session. Pt reports she is feeling better after she had muscle relaxer earlier that made her feel "wobbly." Stand-pivot to wc with close supervision. Provided pt with medium soft there-putty and worked on grip strength, pinch strength, and in-hand manipulation of putty. Pt then worked on standing balance and endurance while working on pt preferred activity of painting. Pt appropriately used L UE to stabilize paper while brushing with R hand. Pt tolerated standing for 20 mins. Pt returned to room and back to recliner in similar fashion. Pt left with safety belt on, call bell in reach, and needs met.   Therapy Documentation Precautions:  Precautions Precautions: Fall Precaution Comments: ataxic Restrictions Weight Bearing Restrictions: No \Pain: Pain Assessment Pain Scale: 0-10 Pain Score: 0-No pain(Pt reports no neck pain with heating pad)   Therapy/Group: Individual Therapy  Valma Cava 05/01/2018, 3:31 PM

## 2018-05-01 NOTE — Progress Notes (Signed)
Patient ID: Meredith Gonzalez, female   DOB: 1935-06-17, 83 y.o.   MRN: [id number] Doing well and inpatient rehab.  Reviewed final evaluation by stroke team on 04/27/2018.  Dr. Leonie Man feels that her event was posterior circulation.  She does have a moderate to severe right internal carotid artery stenosis.  Recommendation would be ultrasound follow-up in 1 year since she is felt to be asymptomatic from a carotid standpoint.  I discussed this with the patient.  We will not follow actively.  We will see her in 1 year with bilateral carotid duplex.  Please call if we can assist

## 2018-05-02 ENCOUNTER — Inpatient Hospital Stay (HOSPITAL_COMMUNITY): Payer: Medicare Other | Admitting: Physical Therapy

## 2018-05-02 ENCOUNTER — Inpatient Hospital Stay (HOSPITAL_COMMUNITY): Payer: Medicare Other

## 2018-05-02 DIAGNOSIS — D638 Anemia in other chronic diseases classified elsewhere: Secondary | ICD-10-CM

## 2018-05-02 DIAGNOSIS — E46 Unspecified protein-calorie malnutrition: Secondary | ICD-10-CM

## 2018-05-02 DIAGNOSIS — R0989 Other specified symptoms and signs involving the circulatory and respiratory systems: Secondary | ICD-10-CM

## 2018-05-02 DIAGNOSIS — I4891 Unspecified atrial fibrillation: Secondary | ICD-10-CM

## 2018-05-02 DIAGNOSIS — M542 Cervicalgia: Secondary | ICD-10-CM

## 2018-05-02 DIAGNOSIS — G8929 Other chronic pain: Secondary | ICD-10-CM

## 2018-05-02 MED ORDER — PRO-STAT SUGAR FREE PO LIQD
30.0000 mL | Freq: Two times a day (BID) | ORAL | Status: DC
  Administered 2018-05-02 – 2018-05-07 (×11): 30 mL via ORAL
  Filled 2018-05-02 (×11): qty 30

## 2018-05-02 NOTE — Progress Notes (Signed)
[place] PHYSICAL MEDICINE & REHABILITATION PROGRESS NOTE   Subjective/Complaints: Patient seen sitting up in bed this morning.  She states she slept well overnight.  She states she feels much better today compared to yesterday morning.  ROS: Denies CP, SOB, N/V/D  Objective:   No results found. No results for input(s): WBC, HGB, HCT, PLT in the last 72 hours. No results for input(s): NA, K, CL, CO2, GLUCOSE, BUN, CREATININE, CALCIUM in the last 72 hours.  Intake/Output Summary (Last 24 hours) at 05/02/2018 1013 Last data filed at 05/01/2018 1704 Gross per 24 hour  Intake 480 ml  Output -  Net 480 ml     Physical Exam: Vital Signs Blood pressure 134/62, pulse 64, temperature 97.9 F (36.6 C), temperature source Oral, resp. rate 18, height 5\' 5"  (1.651 m), weight 58.9 kg, SpO2 99 %.   Constitutional: No distress . Vital signs reviewed. HENT: Normocephalic.  Atraumatic. Eyes: EOMI. No discharge. Cardiovascular: Irregularly irregular. No JVD. Respiratory: CTA Bilaterally. Normal effort. GI: BS +. Non-distended. Musc: No edema or tenderness in extremities. Skin: No evidence of breakdown, no evidence of rash Neurologic: Alert and oriented Motor: 5/5 RUE/RLE.  4 to 4+/5 LUE/LLE.  Psych: Normal mood.  Normal behavior.  Assessment/Plan: 1. Functional deficits secondary to left-sided weaknesssecondary to subcortical/brainstem CVA which require 3+ hours per day of interdisciplinary therapy in a comprehensive inpatient rehab setting.  Physiatrist is providing close team supervision and 24 hour management of active medical problems listed below.  Physiatrist and rehab team continue to assess barriers to discharge/monitor patient progress toward functional and medical goals  Care Tool:  Bathing    Body parts bathed by patient: Right arm, Left arm, Chest, Abdomen, Front perineal area, Face, Buttocks, Right upper leg, Left upper leg, Right lower leg, Left lower leg          Bathing assist Assist Level: Contact Guard/Touching assist     Upper Body Dressing/Undressing Upper body dressing   What is the patient wearing?: Pull over shirt    Upper body assist Assist Level: Set up assist    Lower Body Dressing/Undressing Lower body dressing      What is the patient wearing?: Pants, Underwear/pull up     Lower body assist Assist for lower body dressing: Contact Guard/Touching assist     Toileting Toileting    Toileting assist Assist for toileting: Supervision/Verbal cueing     Transfers Chair/bed transfer  Transfers assist     Chair/bed transfer assist level: Contact Guard/Touching assist     Locomotion Ambulation   Ambulation assist      Assist level: Minimal Assistance - Patient > 75% Assistive device: Walker-rolling Max distance: 30 ft   Walk 10 feet activity   Assist     Assist level: Minimal Assistance - Patient > 75% Assistive device: Walker-rolling   Walk 50 feet activity   Assist    Assist level: Minimal Assistance - Patient > 75% Assistive device: Walker-rolling    Walk 150 feet activity   Assist    Assist level: Minimal Assistance - Patient > 75% Assistive device: Walker-rolling    Walk 10 feet on uneven surface  activity   Assist     Assist level: Minimal Assistance - Patient > 75% Assistive device: Aeronautical engineer Will patient use wheelchair at discharge?: No Type of Wheelchair: Manual    Wheelchair assist level: Contact Guard/Touching assist Max wheelchair distance: 100 ft    Wheelchair 50 feet  with 2 turns activity    Assist        Assist Level: Contact Guard/Touching assist   Wheelchair 150 feet activity     Assist Wheelchair 150 feet activity did not occur: Safety/medical concerns          Medical Problem List and Plan: 1.Ataxic gait with left-sided weaknesssecondary to subcortical/brainstem CVA. MRI not completed due to  pacemaker Continue CIR -plan for follow up with vascular surgery as outpt after discharge to discuss potential CEA  Notes reviewed- stroke carotid stenosis, reviewed- unremarkable for acute intracranial process, and suspected infarct, labs reviewed 2. DVT Prophylaxis/Anticoagulation: Eliquis 3. 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  Reduced to 250mg  q8   K pad 4. Mood:Provide emotional support 5. Neuropsych: This patientiscapable of making decisions on herown behalf. 6. Skin/Wound Care:Routine skin checks 7. Fluids/Electrolytes/Nutrition:Routine in and out's 8. Atrial fibrillation with history of pacemaker. Continue amiodarone 200 mg daily. -on Chronic Eliquis Was on pindolol at home will monitor HR Vitals:   05/01/18 1955 05/02/18 0426  BP: (!) 130/58 134/62  Pulse: 66 64  Resp: 16 18  Temp: 98.2 F (36.8 C) 97.9 F (36.6 C)  SpO2: 95% 99%  HR and BP controlled 3/8 9. Diastolic congestive heart failure. Monitor for any signs of fluid overload 10. Hypothyroidism. Synthroid 11. Hyperlipidemia. Lipitor 12. Diet controlled diabetes mellitus. Blood sugar checks discontinuedas sugars well controlled 13. Constipation. Laxative assistance, moving bowels  -simethicone 160mg  q6 prn for gas  14.  Insomnia - took "40mg  Melatonin" at home but didn't help  -change trazodone to scheduled and add back up dose prn 15.  Labile blood pressure  Labile on 2/8 16.  Hypoalbuminemia  Supplement initiated on 2/8 17.  Anemia of chronic disease  Hemoglobin 10.1 on 2/4  Continue to monitor   LOS: 5 days A FACE TO FACE EVALUATION WAS PERFORMED  Meredith Gonzalez Meredith Gonzalez 05/02/2018, 10:13 AM

## 2018-05-02 NOTE — Progress Notes (Signed)
Occupational Therapy Session Note  Patient Details  Name: Meredith Gonzalez MRN: [id number] Date of Birth: 03-08-1936  Today's Date: 05/02/2018 OT Individual Time: 5701-7793 OT Individual Time Calculation (min): 70 min    Short Term Goals: Week 1:  OT Short Term Goal 1 (Week 1): Pt will complete toilet transfers with Supervision OT Short Term Goal 2 (Week 1): Pt will complete 2 grooming tasks in standing to demonstrate increased activity tolerance OT Short Term Goal 3 (Week 1): Pt will utilize LUE at diminished level during self-care tasks with min cues OT Short Term Goal 4 (Week 1): Pt will complete simple meal prep activity with supervision  Skilled Therapeutic Interventions/Progress Updates:    1;1. Pt reporting neck pain and already received medication. Repositioned with heat at end of session for comfort. Pt completes "spot washing" (underarms, face, peri area and buttocks) at sink with S for standing balance. Pt dons shirt with set up and pants with S for advancing pants past hips iwht 1UE support on sink. Pt requires VC for AE for sock and shoe funnel use. Pt toilets with S using grab bars for steadying. Pt educated on elastic shoe laces for decreased [place] for use in lace up shoes. Exited session with pt seated in recliner, exit alarm on, call light in reach and all needs met.  Therapy Documentation Precautions:  Precautions Precautions: Fall Precaution Comments: ataxic Restrictions Weight Bearing Restrictions: No General:   Vital Signs:   Pain: Pain Assessment Pain Scale: 0-10 Pain Score: 5  Pain Type: Acute pain Pain Location: Neck Pain Descriptors / Indicators: Aching Pain Onset: On-going Patients Stated Pain Goal: 2 Pain Intervention(s): Heat applied(medicated by previous RN)   Therapy/Group: Individual Therapy  Tonny Branch 05/02/2018, 8:32 AM

## 2018-05-02 NOTE — Plan of Care (Signed)
  Problem: Consults Goal: RH STROKE PATIENT EDUCATION Description See Patient Education module for education specifics  Outcome: Progressing   Problem: RH BOWEL ELIMINATION Goal: RH STG MANAGE BOWEL WITH ASSISTANCE Description STG Manage Bowel with min. Assistance.  Outcome: Progressing   Problem: RH BLADDER ELIMINATION Goal: RH STG MANAGE BLADDER WITH ASSISTANCE Description STG Manage Bladder With min. Assistance  Outcome: Progressing   Problem: RH SKIN INTEGRITY Goal: RH STG SKIN FREE OF INFECTION/BREAKDOWN Description With min. Assist.  Outcome: Progressing   Problem: RH SAFETY Goal: RH STG ADHERE TO SAFETY PRECAUTIONS W/ASSISTANCE/DEVICE Description STG Adhere to Safety Precautions With min.Assistance/Device.  Outcome: Progressing   Problem: RH COGNITION-NURSING Goal: RH STG ANTICIPATES NEEDS/CALLS FOR ASSIST W/ASSIST/CUES Description STG Anticipates Needs/Calls for Assist With min Assistance/Cues.  Outcome: Progressing   Problem: RH PAIN MANAGEMENT Goal: RH STG PAIN MANAGED AT OR BELOW PT'S PAIN GOAL Description Less than 3,on 1 to 10 scale  Outcome: Progressing   Problem: RH KNOWLEDGE DEFICIT Goal: RH STG INCREASE KNOWLEDGE OF HYPERTENSION Outcome: Progressing Goal: RH STG INCREASE KNOWLEGDE OF HYPERLIPIDEMIA Outcome: Progressing Goal: RH STG INCREASE KNOWLEDGE OF STROKE PROPHYLAXIS Outcome: Progressing

## 2018-05-02 NOTE — Progress Notes (Signed)
Physical Therapy Session Note  Patient Details  Name: Meredith Gonzalez MRN: [id number] Date of Birth: [birth year]  Today's Date: 05/02/2018 PT Individual Time: 1430-1530 PT Individual Time Calculation (min): 60 min   Short Term Goals: Week 1:  PT Short Term Goal 1 (Week 1): Patient to demonstrate transfers consistently supervision. PT Short Term Goal 2 (Week 1): Patient to ambulate with CGA with RW 200'. PT Short Term Goal 3 (Week 1): Patient to perform household mobility with CGA with RW PT Short Term Goal 4 (Week 1): Patient to negotiate 4 steps with rail CGA.  Skilled Therapeutic Interventions/Progress Updates:   Pt received sitting in recliner and agreeable to PT. Sit<>stand from Recliner with CGA from PT and ambulatory transfer with RW and min assist from PT. Pt transported to rehab gym in [facility]. Gait training with RW x 110f with CGA and min cues for safety in turns. Standing balance instructed by PT to reach and toss bean bag to target with CGA-Supervision A x 10 Bil. Nustep reciprocal movement training x 8 min with supervision assist from PT and min cues for symmetry of movement and proper speed, level 4>5. Gait training with RW x 1646fwith CGA from PT for safety with min cues for posture and AD management. Patient returned to room and left sitting in recliner with call bell in reach and all needs met.         Therapy Documentation Precautions:  Precautions Precautions: Fall Precaution Comments: ataxic Restrictions Weight Bearing Restrictions: No Vital Signs: Therapy Vitals Temp: 98.6 F (37 C) Temp Source: Oral Pulse Rate: 65 Resp: 18 BP: (!) 126/59 Patient Position (if appropriate): Sitting Oxygen Therapy SpO2: 98 % O2 Device: Room Air Pain: Pain Assessment Pain Scale: 0-10 Pain Score: 5  Pain Type: Acute pain Pain Location: Neck Pain Orientation: Right;Left Pain Descriptors / Indicators: Aching Pain Onset: On-going Pain Intervention(s): Medication (See  eMAR)    Therapy/Group: Individual Therapy  AuLorie Phenix/10/2018, 4:10 PM

## 2018-05-02 NOTE — Progress Notes (Signed)
Physical Therapy Session Note  Patient Details  Name: Meredith Gonzalez MRN: [id number] Date of Birth: 01-Sep-1935  Today's Date: 05/02/2018 PT Individual Time: 1191-4782 PT Individual Time Calculation (min): 55 min   Short Term Goals: Week 1:  PT Short Term Goal 1 (Week 1): Patient to demonstrate transfers consistently supervision. PT Short Term Goal 2 (Week 1): Patient to ambulate with CGA with RW 200'. PT Short Term Goal 3 (Week 1): Patient to perform household mobility with CGA with RW PT Short Term Goal 4 (Week 1): Patient to negotiate 4 steps with rail CGA.  Skilled Therapeutic Interventions/Progress Updates:   Pt in recliner and agreeable to therapy, pain as detailed below. Session focused on endurance and global strengthening. Pt ambulated around unit w/ RW and close supervision, 100-150' at a time, x4-5 bouts. Mini squats 3x10, knee marches in standing 3x10. Single finger support on RW to challenge balance. Occasional posterior LOB w/ standing exercises, pt corrected by sitting down on mat. Sit<>stands from elevated mat surface w/o UE assist x3 reps. Frequent seated rest breaks throughout 2/2 fatigue, increased work of breathing w/ all activity. Returned to room and ended session in recliner, all needs in reach.   Therapy Documentation Precautions:  Precautions Precautions: Fall Precaution Comments: ataxic Restrictions Weight Bearing Restrictions: No Vital Signs: Therapy Vitals Temp: 98.6 F (37 C) Temp Source: Oral Pulse Rate: 65 Resp: 18 BP: (!) 126/59 Patient Position (if appropriate): Sitting Oxygen Therapy SpO2: 98 % O2 Device: Room Air Pain: Pain Assessment Pain Scale: 0-10 Pain Score: 5  Pain Type: Acute pain Pain Location: Neck Pain Orientation: Right;Left Pain Descriptors / Indicators: Aching Pain Onset: On-going Pain Intervention(s): Medication (See eMAR)  Therapy/Group: Individual Therapy  Brit Wernette Clent Demark 05/02/2018, 4:59 PM

## 2018-05-03 ENCOUNTER — Inpatient Hospital Stay (HOSPITAL_COMMUNITY): Payer: Medicare Other | Admitting: Physical Therapy

## 2018-05-03 DIAGNOSIS — I5032 Chronic diastolic (congestive) heart failure: Secondary | ICD-10-CM

## 2018-05-03 MED ORDER — FUROSEMIDE 20 MG PO TABS
10.0000 mg | ORAL_TABLET | Freq: Every day | ORAL | Status: DC
  Administered 2018-05-04 – 2018-05-06 (×3): 10 mg via ORAL
  Filled 2018-05-03 (×4): qty 1

## 2018-05-03 NOTE — Progress Notes (Signed)
Physical Therapy Session Note  Patient Details  Name: Meredith Gonzalez MRN: [id number] Date of Birth: 1935/12/02  Today's Date: 05/03/2018 PT Individual Time: 1000-1058 PT Individual Time Calculation (min): 58 min   Short Term Goals: Week 1:  PT Short Term Goal 1 (Week 1): Patient to demonstrate transfers consistently supervision. PT Short Term Goal 2 (Week 1): Patient to ambulate with CGA with RW 200'. PT Short Term Goal 3 (Week 1): Patient to perform household mobility with CGA with RW PT Short Term Goal 4 (Week 1): Patient to negotiate 4 steps with rail CGA.  Skilled Therapeutic Interventions/Progress Updates:  Pt was seen bedside in the am. Pt performed multiple sit to stand transfers with S and stand pivot transfers with c/g with verbal cues. Pt ambulated 175 feet x 2 with rolling walker and c/g with verbal cues. In gym treatment focused on LE strengthening and NMR, utilizing cone taps and alternating cone taps 3 sets x 10 reps each. Pt returned to room following treatment and left sitting up in recliner with call bell within reach.   Therapy Documentation Precautions:  Precautions Precautions: Fall Precaution Comments: ataxic Restrictions Weight Bearing Restrictions: No General:   Vital Signs:  Pain: Pt c/o 6/10 neck pain.    Therapy/Group: Individual Therapy  Dub Amis 05/03/2018, 12:28 PM

## 2018-05-03 NOTE — Progress Notes (Signed)
PRN tylenol given at HS and this AM at 0436, for C/O neck pain. kpad in place to neck. Mild pitting edema to BLE's. Weight this AM using stand up scale=59.1kg. Weight on 2/3=57.3kg and 2/5=58.9kg. Patient reports her weight is up 10 pounds since coming to hospital. On Lasix PTA. Denies SOB. Meredith Gonzalez A

## 2018-05-03 NOTE — Progress Notes (Signed)
[place] PHYSICAL MEDICINE & REHABILITATION PROGRESS NOTE   Subjective/Complaints: Patient seen sitting up in bed this morning.  She states she slept well overnight.  She is worried about her weight.  Discussed with nursing as well.  ROS: Denies CP, SOB, N/V/D  Objective:   No results found. No results for input(s): WBC, HGB, HCT, PLT in the last 72 hours. No results for input(s): NA, K, CL, CO2, GLUCOSE, BUN, CREATININE, CALCIUM in the last 72 hours.  Intake/Output Summary (Last 24 hours) at 05/03/2018 2152 Last data filed at 05/03/2018 1732 Gross per 24 hour  Intake 960 ml  Output -  Net 960 ml     Physical Exam: Vital Signs Blood pressure (!) 149/70, pulse 73, temperature 98.4 F (36.9 C), temperature source Oral, resp. rate 18, height 5\' 5"  (1.651 m), weight 59.1 kg, SpO2 97 %.   Constitutional: No distress . Vital signs reviewed. HENT: Normocephalic.  Atraumatic. Eyes: EOMI. No discharge. Cardiovascular: Irregularly irregular.  No JVD. Respiratory: CTA bilaterally.  Normal effort. GI: BS +. Non-distended. Musc: No edema or tenderness in extremities. Skin: No evidence of breakdown, no evidence of rash Neurologic: Alert and oriented Motor: 5/5 RUE/RLE.  4 to 4+/5 LUE/LLE, unchanged.  Psych: Slightly anxious.  Assessment/Plan: 1. Functional deficits secondary to left-sided weaknesssecondary to subcortical/brainstem CVA which require 3+ hours per day of interdisciplinary therapy in a comprehensive inpatient rehab setting.  Physiatrist is providing close team supervision and 24 hour management of active medical problems listed below.  Physiatrist and rehab team continue to assess barriers to discharge/monitor patient progress toward functional and medical goals  Care Tool:  Bathing    Body parts bathed by patient: Right arm, Left arm, Chest, Abdomen, Front perineal area, Face, Buttocks, Right upper leg, Left upper leg, Right lower leg, Left lower leg          Bathing assist Assist Level: Contact Guard/Touching assist     Upper Body Dressing/Undressing Upper body dressing   What is the patient wearing?: Pull over shirt    Upper body assist Assist Level: Set up assist    Lower Body Dressing/Undressing Lower body dressing      What is the patient wearing?: Pants, Underwear/pull up     Lower body assist Assist for lower body dressing: Contact Guard/Touching assist     Toileting Toileting    Toileting assist Assist for toileting: Supervision/Verbal cueing     Transfers Chair/bed transfer  Transfers assist     Chair/bed transfer assist level: Supervision/Verbal cueing     Locomotion Ambulation   Ambulation assist      Assist level: Contact Guard/Touching assist Assistive device: Walker-rolling Max distance: 175   Walk 10 feet activity   Assist     Assist level: Contact Guard/Touching assist Assistive device: Walker-rolling   Walk 50 feet activity   Assist    Assist level: Contact Guard/Touching assist Assistive device: Walker-rolling    Walk 150 feet activity   Assist    Assist level: Contact Guard/Touching assist Assistive device: Walker-rolling    Walk 10 feet on uneven surface  activity   Assist     Assist level: Minimal Assistance - Patient > 75% Assistive device: Aeronautical engineer Will patient use wheelchair at discharge?: No Type of Wheelchair: Manual    Wheelchair assist level: Contact Guard/Touching assist Max wheelchair distance: 100 ft    Wheelchair 50 feet with 2 turns activity    Assist  Assist Level: Contact Guard/Touching assist   Wheelchair 150 feet activity     Assist Wheelchair 150 feet activity did not occur: Safety/medical concerns          Medical Problem List and Plan: 1.Ataxic gait with left-sided weaknesssecondary to subcortical/brainstem CVA. MRI not completed due to pacemaker Continue  CIR -plan for follow up with vascular surgery as outpt after discharge to discuss potential CEA 2. DVT Prophylaxis/Anticoagulation: Eliquis 3. 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  Reduced to 250mg  q8   K pad  Improving 4. Mood:Provide emotional support 5. Neuropsych: This patientiscapable of making decisions on herown behalf. 6. Skin/Wound Care:Routine skin checks 7. Fluids/Electrolytes/Nutrition:Routine in and out's 8. Atrial fibrillation with history of pacemaker. Continue amiodarone 200 mg daily. -on Chronic Eliquis Was on pindolol at home will monitor HR Vitals:   05/03/18 1426 05/03/18 1936  BP: 138/68 (!) 149/70  Pulse: 78 73  Resp: 16 18  Temp: 98 F (36.7 C) 98.4 F (36.9 C)  SpO2: 99% 97%  HR controlled on 2/9 9. Diastolic congestive heart failure. Monitor for any signs of fluid overload Filed Weights   04/27/18 1543 04/29/18 0900 05/03/18 0259  Weight: 57.3 kg 58.9 kg 59.1 kg   Slowly trending up, Lasix ordered 10. Hypothyroidism. Synthroid 11. Hyperlipidemia. Lipitor 12. Diet controlled diabetes mellitus. Blood sugar checks discontinuedas sugars well controlled 13. Constipation. Laxative assistance, moving bowels  -simethicone 160mg  q6 prn for gas  14.  Insomnia - took "40mg  Melatonin" at home but didn't help  -change trazodone to scheduled and add back up dose prn  Improving 15.  Labile blood pressure  Slightly labile on 2/9 16.  Hypoalbuminemia  Supplement initiated on 2/8 17.  Anemia of chronic disease  Hemoglobin 10.1 on 2/4  Labs ordered for tomorrow  Continue to monitor 18.  Platelets  ?  Trending down  Labs ordered for tomorrow   LOS: 6 days A FACE TO FACE EVALUATION WAS PERFORMED  Ankit Lorie Phenix 05/03/2018, 9:52 PM

## 2018-05-04 ENCOUNTER — Inpatient Hospital Stay (HOSPITAL_COMMUNITY): Payer: Medicare Other | Admitting: Physical Therapy

## 2018-05-04 ENCOUNTER — Inpatient Hospital Stay (HOSPITAL_COMMUNITY): Payer: Medicare Other | Admitting: Occupational Therapy

## 2018-05-04 LAB — CBC WITH DIFFERENTIAL/PLATELET
ABS IMMATURE GRANULOCYTES: 0.02 10*3/uL (ref 0.00–0.07)
Basophils Absolute: 0 10*3/uL (ref 0.0–0.1)
Basophils Relative: 1 %
Eosinophils Absolute: 0.2 10*3/uL (ref 0.0–0.5)
Eosinophils Relative: 3 %
HCT: 33.9 % — ABNORMAL LOW (ref 36.0–46.0)
HEMOGLOBIN: 9.8 g/dL — AB (ref 12.0–15.0)
Immature Granulocytes: 0 %
LYMPHS ABS: 0.8 10*3/uL (ref 0.7–4.0)
Lymphocytes Relative: 14 %
MCH: 27.7 pg (ref 26.0–34.0)
MCHC: 28.9 g/dL — ABNORMAL LOW (ref 30.0–36.0)
MCV: 95.8 fL (ref 80.0–100.0)
Monocytes Absolute: 0.6 10*3/uL (ref 0.1–1.0)
Monocytes Relative: 11 %
NEUTROS ABS: 4 10*3/uL (ref 1.7–7.7)
Neutrophils Relative %: 71 %
Platelets: 227 10*3/uL (ref 150–400)
RBC: 3.54 MIL/uL — ABNORMAL LOW (ref 3.87–5.11)
RDW: 16.7 % — ABNORMAL HIGH (ref 11.5–15.5)
WBC: 5.6 10*3/uL (ref 4.0–10.5)
nRBC: 0 % (ref 0.0–0.2)

## 2018-05-04 NOTE — Progress Notes (Signed)
Social Work Patient ID: Meredith Gonzalez, female   DOB: 1935/09/09, 83 y.o.   MRN: [id number] Therapy team feels pt will need CGA at discharge due to balance issues and for safety. Pt is very upset about this and wants to go home. She feels going to ALF will be the same issue she will be behind a closed door and ambulating but there will be pull cords. Son has jury duty today and can not be reached. Pt feels she is doing well and almost back to her prior functioning. She does not plan to drive now. Will discuss with son and pt and come up with the best plan for pt, options are staying here longer if would benefit, Assisted living or going to a SNF until return home. She has been to one before and doesn't want to go back.

## 2018-05-04 NOTE — Progress Notes (Signed)
Physical Therapy Session Note  Patient Details  Name: Meredith Gonzalez MRN: [id number] Date of Birth: Apr 02, 1935  Today's Date: 05/04/2018 PT Individual Time: 0802-0901 PT Individual Time Calculation (min): 59 min   Short Term Goals: Week 2:  PT Short Term Goal 1 (Week 2): STG = LTG due to ELOS.  Skilled Therapeutic Interventions/Progress Updates:   Pt received sitting up in recliner with nurse present administering medication and pt agreeable to tx. Pt transfers sit<>stand with CGA and ambulates around unit with RW and CGA and verbal cuing for decreased forward lean and keeping the RW close to body while walking. Pt observed to have R lateral lean when ambulating down hallway near gym at beginning of session and states that her R side "feels like it's not doing what it's supposed to do." Therapist provides rest break and takes BP listed below; pt not orthostatic and PA informed. Therapist informs pt of downgraded goals to supervision and that she will need assistance at discharge. Pt becomes very emotional and states she does not want to go to assisted living. Therapist provided listening and reassurance, and pt willing to continue to participate in session; therapist informs social worker of recommendation. Therapist also adjusts RW and observes decreased forward lean during ambulation with verbal cuing. Pt stands with small wedge underneath toes to facilitate heel cord stretching and hip and ankle strategies, and pt matches cards on R and L sides to maintain balance with weight shifting. Requires min assist to get into position but CGA>supervision during activity. Pt ambulates with RW weaving in and out of cones for gait training reenforcement and RW management during turns. Pt left sitting up in recliner with chair pad alarm on and all needs in reach.  BP in sitting: 117/62; HR 77 BP in standing: 109/54; HR 79 BP in standing 1 min: 110/57; HR 79  No c/o pain during session.  Therapy  Documentation Precautions:  Precautions Precautions: Fall Precaution Comments: ataxic Restrictions Weight Bearing Restrictions: No   Therapy/Group: Individual Therapy  [name] 05/04/2018, 12:29 PM

## 2018-05-04 NOTE — Progress Notes (Signed)
Physical Therapy Weekly Progress Note  Patient Details  Name: Meredith Gonzalez MRN: [id number] Date of Birth: 1935-12-30  Beginning of progress report period: April 28, 2018 End of progress report period: May 04, 2018  Today's Date: 05/04/2018  Patient has met 2 of 4 short term goals.  Pt is making very slow progress towards LTG's due to impaired balance & strength. Pt currently, consistently requires CGA, and at times min assist for balance and mobility with RW. Pt has trialed rollator but demonstrates significant anterior lean and impaired balance with use of AD and benefits more from safety with RW.  Pt is currently at high fall risk, as noted of her Berg Balance Score of 25/56 on 04/29/18. Pt would benefit from continued PT treatment to focus on transfers, gait with RW and proper use of AD, balance, endurance, strength, and pt education prior to dc.  Patient continues to demonstrate the following deficits muscle weakness, decreased cardiorespiratoy endurance, ataxia and decreased coordination, decreased safety awareness, and decreased sitting balance, decreased standing balance, decreased postural control and decreased balance strategies and therefore will continue to benefit from skilled PT intervention to increase functional independence with mobility.  Patient not progressing toward long term goals.  See goal revision..  Plan of care revisions: goals downgraded to supervision overall with LRAD, except CGA for stair negotiation with B rails for strengthening with PT.  PT Short Term Goals Week 1:  PT Short Term Goal 1 (Week 1): Patient to demonstrate transfers consistently supervision. PT Short Term Goal 1 - Progress (Week 1): Not met PT Short Term Goal 2 (Week 1): Patient to ambulate with CGA with RW 200'. PT Short Term Goal 2 - Progress (Week 1): Met PT Short Term Goal 3 (Week 1): Patient to perform household mobility with CGA with RW PT Short Term Goal 3 - Progress (Week 1): Met PT  Short Term Goal 4 (Week 1): Patient to negotiate 4 steps with rail CGA. PT Short Term Goal 4 - Progress (Week 1): Not met Week 2:  PT Short Term Goal 1 (Week 2): STG = LTG due to ELOS.   Therapy Documentation Precautions:  Precautions Precautions: Fall Precaution Comments: ataxic Restrictions Weight Bearing Restrictions: No   Therapy/Group: Individual Therapy  [name] 05/04/2018, 9:34 AM

## 2018-05-04 NOTE — Progress Notes (Signed)
Occupational Therapy Session Note  Patient Details  Name: Meredith Gonzalez MRN: [id number] Date of Birth: [birth year]  Today's Date: 05/04/2018 OT Individual Time: 0720-0800 and 1400-1456 OT Individual Time Calculation (min): 40 min and 56 min   Short Term Goals: Week 1:  OT Short Term Goal 1 (Week 1): Pt will complete toilet transfers with Supervision OT Short Term Goal 2 (Week 1): Pt will complete 2 grooming tasks in standing to demonstrate increased activity tolerance OT Short Term Goal 3 (Week 1): Pt will utilize LUE at diminished level during self-care tasks with min cues OT Short Term Goal 4 (Week 1): Pt will complete simple meal prep activity with supervision  Skilled Therapeutic Interventions/Progress Updates:    Session 1: Upon entering the room, pt seated on EOB finishing breakfast. Pt with no c/o pain and agreeable to OT intervention. Pt ambulating to bathroom for toileting need with CGA for balance and min cuing to stay within RW for safety. Pt performed toilet transfer and hygiene with supervision. Pt doffing clothing items while seated on commode and donning clean items with set up A for UB dressing and min A for balance with LB clothing management in standing. Pt standing at sink for grooming tasks as well with close supervision. Pt transferred onto recliner chair with chair alarm activated and RN present with medications. All needs within reach.   Session 2: Upon entering the room, pt seated in recliner chair and agreeable to OT intervention to focus on community mobility. Pt ambulating with RW with close supervision to transfer into wheelchair. OT assisted pt downstairs to gift shop via wheelchair for energy conservation. Pt ambulating in gift shop with S - min guard for 25 minutes with RW. Pt continuing to needing cuing to remain within RW for safety. Pt obtaining items from various heights of shelves as well as navigating tight spaces without significant LOB. OT assisting pt back to  room at end of session and pt transferred back into recliner chair with k pad donned on neck for pain management. Call bell and all needed items within reach. Chair alarm activated.   Therapy Documentation Precautions:  Precautions Precautions: Fall Precaution Comments: ataxic Restrictions Weight Bearing Restrictions: No General:   Vital Signs:  Pain: Pain Assessment Pain Scale: 0-10 Pain Score: 0-No pain ADL: ADL Grooming: Minimal assistance Where Assessed-Grooming: Standing at sink Upper Body Bathing: Minimal assistance Where Assessed-Upper Body Bathing: Shower Lower Body Bathing: Moderate assistance Where Assessed-Lower Body Bathing: Shower Upper Body Dressing: Minimal assistance Where Assessed-Upper Body Dressing: Edge of bed Lower Body Dressing: Maximal assistance Where Assessed-Lower Body Dressing: Edge of bed Toileting: Minimal assistance Where Assessed-Toileting: Glass blower/designer: Psychiatric nurse Method: Counselling psychologist: Emergency planning/management officer Transfer: Environmental education officer Method: Heritage manager: Shower seat with back, Grab bars   Therapy/Group: Individual Therapy  [name] 05/04/2018, 10:23 AM

## 2018-05-04 NOTE — Progress Notes (Signed)
[place] PHYSICAL MEDICINE & REHABILITATION PROGRESS NOTE   Subjective/Complaints: Overall without new issues. Concerned about why she's not walking better. Wanted to know where her stroke was  ROS: Patient denies fever, rash, sore throat, blurred vision, nausea, vomiting, diarrhea, cough, shortness of breath or chest pain, joint or back pain, headache, or mood change.    Objective:   No results found. Recent Labs    05/04/18 0500  WBC 5.6  HGB 9.8*  HCT 33.9*  PLT 227   No results for input(s): NA, K, CL, CO2, GLUCOSE, BUN, CREATININE, CALCIUM in the last 72 hours.  Intake/Output Summary (Last 24 hours) at 05/04/2018 0900 Last data filed at 05/04/2018 0744 Gross per 24 hour  Intake 600 ml  Output -  Net 600 ml     Physical Exam: Vital Signs Blood pressure (!) 152/75, pulse 78, temperature 97.6 F (36.4 C), temperature source Oral, resp. rate 18, height 5\' 5"  (1.651 m), weight 58.9 kg, SpO2 96 %.   Constitutional: No distress . Vital signs reviewed. HEENT: EOMI, oral membranes moist Neck: supple Cardiovascular: RRR without murmur. No JVD    Respiratory: CTA Bilaterally without wheezes or rales. Normal effort    GI: BS +, non-tender, non-distended  Musc: No edema or tenderness in extremities. Skin: No evidence of breakdown, no evidence of rash Neurologic: Alert and oriented Motor: 5/5 RUE/RLE.  4 to 4+/5 LUE/LLE, unchanged.  Psych: anxious.  Assessment/Plan: 1. Functional deficits secondary to left-sided weaknesssecondary to subcortical/brainstem CVA which require 3+ hours per day of interdisciplinary therapy in a comprehensive inpatient rehab setting.  Physiatrist is providing close team supervision and 24 hour management of active medical problems listed below.  Physiatrist and rehab team continue to assess barriers to discharge/monitor patient progress toward functional and medical goals  Care Tool:  Bathing    Body parts bathed by patient: Right arm,  Left arm, Chest, Abdomen, Front perineal area, Face, Buttocks, Right upper leg, Left upper leg, Right lower leg, Left lower leg         Bathing assist Assist Level: Contact Guard/Touching assist     Upper Body Dressing/Undressing Upper body dressing   What is the patient wearing?: Pull over shirt    Upper body assist Assist Level: Set up assist    Lower Body Dressing/Undressing Lower body dressing      What is the patient wearing?: Pants, Underwear/pull up     Lower body assist Assist for lower body dressing: Contact Guard/Touching assist     Toileting Toileting    Toileting assist Assist for toileting: Supervision/Verbal cueing     Transfers Chair/bed transfer  Transfers assist     Chair/bed transfer assist level: Supervision/Verbal cueing     Locomotion Ambulation   Ambulation assist      Assist level: Contact Guard/Touching assist Assistive device: Walker-rolling Max distance: 175   Walk 10 feet activity   Assist     Assist level: Contact Guard/Touching assist Assistive device: Walker-rolling   Walk 50 feet activity   Assist    Assist level: Contact Guard/Touching assist Assistive device: Walker-rolling    Walk 150 feet activity   Assist    Assist level: Contact Guard/Touching assist Assistive device: Walker-rolling    Walk 10 feet on uneven surface  activity   Assist     Assist level: Minimal Assistance - Patient > 75% Assistive device: Aeronautical engineer Will patient use wheelchair at discharge?: No Type of Wheelchair: Manual  Wheelchair assist level: Contact Guard/Touching assist Max wheelchair distance: 100 ft    Wheelchair 50 feet with 2 turns activity    Assist        Assist Level: Contact Guard/Touching assist   Wheelchair 150 feet activity     Assist Wheelchair 150 feet activity did not occur: Safety/medical concerns          Medical Problem List and  Plan: 1.Ataxic gait with left-sided weaknesssecondary to subcortical/brainstem CVA. MRI not completed due to pacemaker Continue CIR - vascular surgery has followed up and will see in 1 year as stroke was felt to be posterior circulation  -reviewed with pt likely area of her stroke and etiology 2. DVT Prophylaxis/Anticoagulation: Eliquis 3. Pain Management:Neurontin 300 mg daily at 600 mg daily at bedtime  -tylenol, kpad for neck pain ineffective (hx of C1-C2 fx's per CT 12/2017)  - robaxin 250mg  q8 prn spasms  -K pad  -Improving 4. Mood:Provide emotional support 5. Neuropsych: This patientiscapable of making decisions on herown behalf. 6. Skin/Wound Care:Routine skin checks 7. Fluids/Electrolytes/Nutrition:Routine in and out's 8. Atrial fibrillation with history of pacemaker. Continue amiodarone 200 mg daily. -on Chronic Eliquis Was on pindolol at home will monitor HR Vitals:   05/03/18 1936 05/04/18 0309  BP: (!) 149/70 (!) 152/75  Pulse: 73 78  Resp: 18 18  Temp: 98.4 F (36.9 C) 97.6 F (36.4 C)  SpO2: 97% 96%  HR controlled on 2/10 9. Diastolic congestive heart failure. Monitor for any signs of fluid overload Filed Weights   04/29/18 0900 05/03/18 0259 05/04/18 0309  Weight: 58.9 kg 59.1 kg 58.9 kg   Slowly trending up, Lasix ordered 10. Hypothyroidism. Synthroid 11. Hyperlipidemia. Lipitor 12. Diet controlled diabetes mellitus. Blood sugar checks discontinuedas sugars well controlled 13. Constipation. Laxative assistance, moving bowels  -simethicone 160mg  q6 prn for gas  14.  Insomnia - took "40mg  Melatonin" at home but didn't help  -changed trazodone to scheduled and add back up dose prn  Improving 15.  Labile blood pressure  Fair/borderline control control  16.  Hypoalbuminemia  Supplement initiated on 2/8 17.  Anemia of chronic disease  Hemoglobin 10.1 on 2/4, holding at 9.8 2/10  Continue to monitor 18.   Platelets  -back up to 227k 2/10   LOS: 7 days A FACE TO FACE EVALUATION WAS PERFORMED  Meredith Staggers 05/04/2018, 9:00 AM

## 2018-05-04 NOTE — Progress Notes (Signed)
Physical Therapy Session Note  Patient Details  Name: Meredith Gonzalez MRN: [id number] Date of Birth: February 18, 1936  Today's Date: 05/04/2018 PT Individual Time: 1115-1200 PT Individual Time Calculation (min): 45 min   Short Term Goals: Week 2:  PT Short Term Goal 1 (Week 2): STG = LTG due to ELOS.  Skilled Therapeutic Interventions/Progress Updates:    Pt received seated in recliner in room, agreeable to PT session. Sit to stand with CGA to RW. Ambulation 2 x 150 ft with RW and CGA for balance. Pt has one instance where she takes BUE off of RW to wave at another patient and loses balance to the L, requires min A to correct and maintain standing balance. Education with patient about getting distracted by her environment and needing to pay attention especially when standing to avoid LOB. Session focus on dynamic and standing standing balance: sidesteps L/R x 30 ft each with BUE support and CGA, gait with one handrail for support and CGA (increase in ataxia noted), tandem amb with one UE support and min A; alt L/R cone taps with one UE support and min A for balance for BLE coordination training. Pt is upset that therapy is recommending d/c to ALF, education with patient about safety and that with her current assist level she would not be safe to return to independent living. Pt also reports she has been "furniture walking" in her home and has had several falls. Education with patient that furniture walking is not recommended. Pt not very receptive to education and continues to perseverate on wanting to maintain her independence. Pt left seated in recliner in room set up for lunch, chair alarm in place.  Therapy Documentation Precautions:  Precautions Precautions: Fall Precaution Comments: ataxic Restrictions Weight Bearing Restrictions: No Pain: Pain Assessment Pain Scale: 0-10 Pain Score: 0-No pain    Therapy/Group: Individual Therapy  Excell Seltzer, PT, DPT  05/04/2018, 1:05 PM

## 2018-05-04 NOTE — Progress Notes (Signed)
Physical Therapy Note  Patient Details  Name: Meredith Gonzalez MRN: [id number] Date of Birth: 1935/04/21 Today's Date: 05/04/2018    Pt's goals have been downgraded to supervision overall, except CGA for stair negotiation with B rails for strengthening with PT. Pt has participated in [facility] program since 04/28/10 and continues to mobilize at a CGA<>min assist level with a RW. This therapist educated pt & LCSW on downgraded goals. This therapist recommends pt d/c with supervision, as she is making slow progress towards independence & is a high fall risk (Berg Balance Test = 25/56 on 04/29/18).  Pt has also experienced a fall at home PTA resulting in cervical fractures in September 2019.  [name] 05/04/2018, 8:49 AM

## 2018-05-05 ENCOUNTER — Inpatient Hospital Stay (HOSPITAL_COMMUNITY): Payer: Medicare Other | Admitting: Physical Therapy

## 2018-05-05 ENCOUNTER — Inpatient Hospital Stay (HOSPITAL_COMMUNITY): Payer: Medicare Other | Admitting: Occupational Therapy

## 2018-05-05 LAB — BASIC METABOLIC PANEL
Anion gap: 7 (ref 5–15)
BUN: 23 mg/dL (ref 8–23)
CHLORIDE: 111 mmol/L (ref 98–111)
CO2: 24 mmol/L (ref 22–32)
Calcium: 8.4 mg/dL — ABNORMAL LOW (ref 8.9–10.3)
Creatinine, Ser: 0.86 mg/dL (ref 0.44–1.00)
GFR calc non Af Amer: >60 mL/min (ref >60)
Glucose, Bld: 104 mg/dL — ABNORMAL HIGH (ref 70–99)
Potassium: 4 mmol/L (ref 3.5–5.1)
Sodium: 142 mmol/L (ref 135–145)

## 2018-05-05 LAB — CBC
HCT: 34.1 % — ABNORMAL LOW (ref 36.0–46.0)
Hemoglobin: 10.4 g/dL — ABNORMAL LOW (ref 12.0–15.0)
MCH: 29 pg (ref 26.0–34.0)
MCHC: 30.5 g/dL (ref 30.0–36.0)
MCV: 95 fL (ref 80.0–100.0)
NRBC: 0 % (ref 0.0–0.2)
Platelets: 237 10*3/uL (ref 150–400)
RBC: 3.59 MIL/uL — ABNORMAL LOW (ref 3.87–5.11)
RDW: 16.7 % — ABNORMAL HIGH (ref 11.5–15.5)
WBC: 6.1 10*3/uL (ref 4.0–10.5)

## 2018-05-05 NOTE — Plan of Care (Signed)
  Problem: Consults Goal: RH STROKE PATIENT EDUCATION Description See Patient Education module for education specifics  Outcome: Progressing   Problem: RH BOWEL ELIMINATION Goal: RH STG MANAGE BOWEL WITH ASSISTANCE Description STG Manage Bowel with min. Assistance.  Outcome: Progressing   Problem: RH BLADDER ELIMINATION Goal: RH STG MANAGE BLADDER WITH ASSISTANCE Description STG Manage Bladder With min. Assistance  Outcome: Progressing   Problem: RH SKIN INTEGRITY Goal: RH STG SKIN FREE OF INFECTION/BREAKDOWN Description With min. Assist.  Outcome: Progressing   Problem: RH SAFETY Goal: RH STG ADHERE TO SAFETY PRECAUTIONS W/ASSISTANCE/DEVICE Description STG Adhere to Safety Precautions With min.Assistance/Device.  Outcome: Progressing   Problem: RH COGNITION-NURSING Goal: RH STG ANTICIPATES NEEDS/CALLS FOR ASSIST W/ASSIST/CUES Description STG Anticipates Needs/Calls for Assist With min Assistance/Cues.  Outcome: Progressing   Problem: RH PAIN MANAGEMENT Goal: RH STG PAIN MANAGED AT OR BELOW PT'S PAIN GOAL Description Less than 3,on 1 to 10 scale  Outcome: Progressing   Problem: RH KNOWLEDGE DEFICIT Goal: RH STG INCREASE KNOWLEDGE OF HYPERTENSION Outcome: Progressing Goal: RH STG INCREASE KNOWLEGDE OF HYPERLIPIDEMIA Outcome: Progressing Goal: RH STG INCREASE KNOWLEDGE OF STROKE PROPHYLAXIS Outcome: Progressing

## 2018-05-05 NOTE — Progress Notes (Signed)
Social Work Patient ID: Meredith Gonzalez, female   DOB: Jan 16, 1936, 83 y.o.   MRN: [id number] Spoke with kevin-son via telephone to discuss team's concerns and discharge. He voiced both of them think it was the trazodone she was given Sunday night and the affects of it on Monday. Pt and son feel with [employer] and not driving she will be able to manage at home. She has fallen several times there due to not wanting to change her ways. So anywhere she is she will be a safety risk. Will see how she does today and continue to discuss best and safest option. Pt wants to go home and go from there, she does want to go to ALF and/or NH.

## 2018-05-05 NOTE — Progress Notes (Signed)
Physical Therapy Session Note  Patient Details  Name: Meredith Gonzalez MRN: [id number] Date of Birth: [birth year]  Today's Date: 05/05/2018 PT Individual Time: 0805-0905 PT Individual Time Calculation (min): 60 min   Short Term Goals: Week 2:  PT Short Term Goal 1 (Week 2): STG = LTG due to ELOS.  Skilled Therapeutic Interventions/Progress Updates:   Pt in supine and agreeable to therapy, no c/o pain. Simulated community outing w/ visit to [employer]. Total assist w/c transport to [employer] for time management. Pt ambulated in community environment w/ RW and close supervision, >500' and >15 min in total before needing seated rest break. Discussed modifications needed in community if she cannot carry items from register to table, etc. Pt appropriately asking therapist to carry items for her and she plans to always have someone with her when in community. Ambulated back to unit and worked on balance strategies while standing on airex pad. Put together multiple puzzles and peg board task w/o UE support on table. Educated on importance of "brain games" such as puzzles and crosswords for overall brain health. CGA overall as pt's LEs swaying majority of time on airex pad, no overt LOB. NuStep 5 min @ level 4 for global endurance and strengthening. Returned to room and ended session in recliner, all needs in reach.   Therapy Documentation Precautions:  Precautions Precautions: Fall Precaution Comments: ataxic Restrictions Weight Bearing Restrictions: No Vital Signs: Therapy Vitals Temp: 98.4 F (36.9 C) Temp Source: Oral Pulse Rate: 79 Resp: 18 BP: 130/76 Patient Position (if appropriate): Lying Oxygen Therapy SpO2: 100 % O2 Device: Room Air  Therapy/Group: Individual Therapy  Meredith Gonzalez Clent Demark 05/05/2018, 9:17 AM

## 2018-05-05 NOTE — Progress Notes (Signed)
Physical Therapy Session Note  Patient Details  Name: Meredith Gonzalez MRN: [id number] Date of Birth: [birth year]  Today's Date: 05/05/2018 PT Individual Time: 1115-1200 PT Individual Time Calculation (min): 45 min   Short Term Goals: Week 2:  PT Short Term Goal 1 (Week 2): STG = LTG due to ELOS.  Skilled Therapeutic Interventions/Progress Updates:    Pt received seated in recliner in room, agreeable to PT session. No complaints of pain. Pt transfers sit to stand with Supervision to RW throughout therapy session. Ambulation x 200 ft with RW and Supervision. Static standing balance: no UE support ball toss with SBA; no UE support ball toss on trampoline with CGA while engaging in cognitive task. Pt does tend to exhibit decreased static standing balance when distracted but overall demos good safety awareness during session. Biodex limits of stability level 1: BUE support 80% and 87%, LUE support 72% and 78%; RUE support 66% and 59% accuracy with CGA for balance, focus on multidirectional weight shift at hips and ankles while following visual stimuli. Pt left seated in recliner in room with needs in reach at end of session, chair alarm in place, setup for lunch.  Therapy Documentation Precautions:  Precautions Precautions: Fall Precaution Comments: ataxic Restrictions Weight Bearing Restrictions: No   Therapy/Group: Individual Therapy   Excell Seltzer, PT, DPT  05/05/2018, 12:11 PM

## 2018-05-05 NOTE — Progress Notes (Signed)
Physical Therapy Session Note  Patient Details  Name: Meredith Gonzalez MRN: [id number] Date of Birth: Jun 14, 1935  Today's Date: 05/05/2018 PT Individual Time: 1000-1059 PT Individual Time Calculation (min): 59 min   Short Term Goals: Week 1:  PT Short Term Goal 1 (Week 1): Patient to demonstrate transfers consistently supervision. PT Short Term Goal 1 - Progress (Week 1): Not met PT Short Term Goal 2 (Week 1): Patient to ambulate with CGA with RW 200'. PT Short Term Goal 2 - Progress (Week 1): Met PT Short Term Goal 3 (Week 1): Patient to perform household mobility with CGA with RW PT Short Term Goal 3 - Progress (Week 1): Met PT Short Term Goal 4 (Week 1): Patient to negotiate 4 steps with rail CGA. PT Short Term Goal 4 - Progress (Week 1): Not met Week 2:  PT Short Term Goal 1 (Week 2): STG = LTG due to ELOS.  Skilled Therapeutic Interventions/Progress Updates:   Pt received sitting up in recliner and agreeable to tx. Pt sits<>stands with supervision and ambulates around unit with RW and supervision. Pt states she feels much better today with change in medication and more stable when walking. Pt performs lateral stepping with BUE support and backwards walking with 1 UE>no UE support at rail for 60 ft each with CGA and verbal cuing for larger steps and for pt to focus on task while walking. Pt voices need to use restroom and able to ambulate back to room with RW and supervision. Pt performs toilet transfer with CGA due to quick need to void bladder. Pt able to perform self-hygiene with supervision. Pt taps 3" step with alternating BLE for dynamic SLS balance with min assist>CGA to maintain balance and verbal cuing to look down at step. Pt performs floor transfer with min assist and verbal and visual cuing for proper technique when crawling to stable surface and standing up from the ground. Therapist educated pt on use of RW vs. use of rollator following discharge from hospital. Pt ascends/descends  8 3" steps and 4 6" steps self-selected step-over-step pattern with 2 handrails and supervision for BLE strengthening. Pt left sitting up in recliner with chair pad alarm on and all needs in reach.  No c/o pain during session.  Therapy Documentation Precautions:  Precautions Precautions: Fall Precaution Comments: ataxic Restrictions Weight Bearing Restrictions: No    Therapy/Group: Individual Therapy  Izzac Rockett 05/05/2018, 12:23 PM

## 2018-05-05 NOTE — Progress Notes (Signed)
[place] PHYSICAL MEDICINE & REHABILITATION PROGRESS NOTE   Subjective/Complaints: No HA pain.  Pt feels better today, felt "hangover" effect from Trazodone yesterday  ROS: Patient denies CP, SOB, N/V/D  Objective:   No results found. Recent Labs    05/04/18 0500 05/05/18 0516  WBC 5.6 6.1  HGB 9.8* 10.4*  HCT 33.9* 34.1*  PLT 227 237   Recent Labs    05/05/18 0516  NA 142  K 4.0  CL 111  CO2 24  GLUCOSE 104*  BUN 23  CREATININE 0.86  CALCIUM 8.4*    Intake/Output Summary (Last 24 hours) at 05/05/2018 1417 Last data filed at 05/05/2018 1303 Gross per 24 hour  Intake 360 ml  Output -  Net 360 ml     Physical Exam: Vital Signs Blood pressure (!) 124/58, pulse 71, temperature 98.1 F (36.7 C), temperature source Oral, resp. rate 20, height 5\' 5"  (1.651 m), weight 58.9 kg, SpO2 97 %.   Constitutional: No distress . Vital signs reviewed. HEENT: EOMI, oral membranes moist Neck: supple Cardiovascular: RRR without murmur. No JVD    Respiratory: CTA Bilaterally without wheezes or rales. Normal effort    GI: BS +, non-tender, non-distended  Musc: No edema or tenderness in extremities. Skin: No evidence of breakdown, no evidence of rash Neurologic: Alert and oriented Motor: 5/5 RUE/RLE.  4 to 4+/5 LUE/LLE, unchanged.  Psych: anxious.  Assessment/Plan: 1. Functional deficits secondary to left-sided weaknesssecondary to subcortical/brainstem CVA which require 3+ hours per day of interdisciplinary therapy in a comprehensive inpatient rehab setting.  Physiatrist is providing close team supervision and 24 hour management of active medical problems listed below.  Physiatrist and rehab team continue to assess barriers to discharge/monitor patient progress toward functional and medical goals  Care Tool:  Bathing    Body parts bathed by patient: Right arm, Left arm, Chest, Abdomen, Front perineal area, Face, Buttocks, Right upper leg, Left upper leg, Right lower  leg, Left lower leg         Bathing assist Assist Level: Contact Guard/Touching assist     Upper Body Dressing/Undressing Upper body dressing   What is the patient wearing?: Pull over shirt    Upper body assist Assist Level: Set up assist    Lower Body Dressing/Undressing Lower body dressing      What is the patient wearing?: Pants, Underwear/pull up     Lower body assist Assist for lower body dressing: Contact Guard/Touching assist     Toileting Toileting    Toileting assist Assist for toileting: Supervision/Verbal cueing     Transfers Chair/bed transfer  Transfers assist     Chair/bed transfer assist level: Supervision/Verbal cueing     Locomotion Ambulation   Ambulation assist      Assist level: Supervision/Verbal cueing Assistive device: Walker-rolling Max distance: 150 ft   Walk 10 feet activity   Assist     Assist level: Supervision/Verbal cueing Assistive device: Walker-rolling   Walk 50 feet activity   Assist    Assist level: Supervision/Verbal cueing Assistive device: Walker-rolling    Walk 150 feet activity   Assist    Assist level: Supervision/Verbal cueing Assistive device: Walker-rolling    Walk 10 feet on uneven surface  activity   Assist     Assist level: Minimal Assistance - Patient > 75% Assistive device: Aeronautical engineer Will patient use wheelchair at discharge?: No Type of Wheelchair: Manual    Wheelchair assist level: Contact Guard/Touching assist  Max wheelchair distance: 100 ft    Wheelchair 50 feet with 2 turns activity    Assist        Assist Level: Contact Guard/Touching assist   Wheelchair 150 feet activity     Assist Wheelchair 150 feet activity did not occur: Safety/medical concerns          Medical Problem List and Plan: 1.Ataxic gait with left-sided weaknesssecondary to subcortical/brainstem CVA. MRI not completed due to  pacemaker Continue CIR, team conf in am - vascular surgery has followed up and will see in 1 year as stroke was felt to be posterior circulation   2. DVT Prophylaxis/Anticoagulation: Eliquis 3. Pain Management:Neurontin 300 mg daily at 600 mg daily at bedtime  -tylenol, kpad for neck pain ineffective (hx of C1-C2 fx's per CT 12/2017)  - robaxin 250mg  q8 prn spasms  -K pad  -Improving 4. Mood:Provide emotional support 5. Neuropsych: This patientiscapable of making decisions on herown behalf. 6. Skin/Wound Care:Routine skin checks 7. Fluids/Electrolytes/Nutrition:Routine in and out's 8. Atrial fibrillation with history of pacemaker. Continue amiodarone 200 mg daily. -on Chronic Eliquis Was on pindolol at home will monitor HR Vitals:   05/05/18 0543 05/05/18 1400  BP: 130/76 (!) 124/58  Pulse: 79 71  Resp: 18 20  Temp: 98.4 F (36.9 C) 98.1 F (36.7 C)  SpO2: 100% 97%  HR controlled on 2/11 9. Diastolic congestive heart failure. Monitor for any signs of fluid overload Filed Weights   04/29/18 0900 05/03/18 0259 05/04/18 0309  Weight: 58.9 kg 59.1 kg 58.9 kg   Slowly trending up, Lasix ordered 10. Hypothyroidism. Synthroid 11. Hyperlipidemia. Lipitor 12. Diet controlled diabetes mellitus. Blood sugar checks discontinuedas sugars well controlled 13. Constipation. Laxative assistance, moving bowels  -simethicone 160mg  q6 prn for gas  14.  Insomnia - took "40mg  Melatonin" at home but didn't help  -changed trazodone to scheduled and add back up dose prn  Hangover effect, took no meds last noc and slept well per pt 15.  HTN controlled only on lasix   Vitals:   05/05/18 0543 05/05/18 1400  BP: 130/76 (!) 124/58  Pulse: 79 71  Resp: 18 20  Temp: 98.4 F (36.9 C) 98.1 F (36.7 C)  SpO2: 100% 97%   16.  Hypoalbuminemia  Supplement initiated on 2/8 17.  Anemia of chronic disease  Hemoglobin 10.1 on 2/4, holding at 9.8  2/10  Continue to monitor 18.  Platelets  -back up to 227k 2/10   LOS: 8 days A FACE TO FACE EVALUATION WAS PERFORMED  Charlett Blake 05/05/2018, 2:17 PM

## 2018-05-05 NOTE — Progress Notes (Addendum)
Occupational Therapy Session Note  Patient Details  Name: Meredith Gonzalez MRN: [id number] Date of Birth: [birth year]  Today's Date: 05/05/2018 OT Individual Time: 0037-0488 OT Individual Time Calculation (min): 48 min    Short Term Goals: Week 1:  OT Short Term Goal 1 (Week 1): Pt will complete toilet transfers with Supervision OT Short Term Goal 2 (Week 1): Pt will complete 2 grooming tasks in standing to demonstrate increased activity tolerance OT Short Term Goal 3 (Week 1): Pt will utilize LUE at diminished level during self-care tasks with min cues OT Short Term Goal 4 (Week 1): Pt will complete simple meal prep activity with supervision  Skilled Therapeutic Interventions/Progress Updates:   Session 1:  ([id number]) Pt completed functional mobility to the dayroom with supervision using the RW for support.  Once in the dayroom had her work on dynamic standing balance while using the [employer].  She was able to maintain static standing balance without use of the RW for picking up and placing the checkers.  She utilized the reacher for picking them up off of the floor and putting them back on the stand.  No pain reported and no LOB noted with this task which was repeated X3.  She then ambulated back to the room with supervision.  She transferred over to the recliner for resting until next session.  Call button and phone in reach with chair alarm in place.    Session 2: [phone])  Pt ambulated from her room to the dayroom with use of the RW and supervision.  She worked on Automotive engineer with use of the Wii balance board activity.  Min hand held assist needed for stepping up and down off of the board.  Once on the board she also needed min assist for hip and ankle strategies anteriorly as she was not shifting her weight forward enough to be successful with the game initially.  This improved slightly with further intervals.  Finished session with return to the room with call button  and phone in reach and chair alarm in place.    Therapy Documentation Precautions:  Precautions Precautions: Fall Precaution Comments: ataxic Restrictions Weight Bearing Restrictions: No   Pain: Pain Assessment Pain Scale: 0-10 Pain Score: 0-No pain   Therapy/Group: Individual Therapy  Kearia Yin OTR/L 05/05/2018, 3:47 PM

## 2018-05-06 ENCOUNTER — Inpatient Hospital Stay (HOSPITAL_COMMUNITY): Payer: Medicare Other | Admitting: Occupational Therapy

## 2018-05-06 ENCOUNTER — Inpatient Hospital Stay (HOSPITAL_COMMUNITY): Payer: Medicare Other | Admitting: Physical Therapy

## 2018-05-06 MED ORDER — ALBUTEROL SULFATE (2.5 MG/3ML) 0.083% IN NEBU
2.5000 mg | INHALATION_SOLUTION | Freq: Four times a day (QID) | RESPIRATORY_TRACT | Status: DC | PRN
  Administered 2018-05-07: 2.5 mg via RESPIRATORY_TRACT
  Filled 2018-05-06 (×2): qty 3

## 2018-05-06 NOTE — Discharge Summary (Signed)
Discharge summary job [phone]

## 2018-05-06 NOTE — Discharge Instructions (Signed)
Inpatient Rehab Discharge Instructions  Meredith Gonzalez Discharge date and time: No discharge date for patient encounter.   Activities/Precautions/ Functional Status: Activity: activity as tolerated Diet: diabetic diet Wound Care: none needed Functional status:  ___ No restrictions     ___ Walk up steps independently ___ 24/7 supervision/assistance   ___ Walk up steps with assistance ___ Intermittent supervision/assistance  ___ Bathe/dress independently ___ Walk with walker     _x STROKE/TIA DISCHARGE INSTRUCTIONS SMOKING Cigarette smoking nearly doubles your risk of having a stroke & is the single most alterable risk factor  If you smoke or have smoked in the last 12 months, you are advised to quit smoking for your health.  Most of the excess cardiovascular risk related to smoking disappears within a year of stopping.  Ask you doctor about anti-smoking medications  [place] Quit Line: 1-800-QUIT NOW  Free Smoking Cessation Classes [phone]  CHOLESTEROL Know your levels; limit fat & cholesterol in your diet  Lipid Panel     Component Value Date/Time   CHOL 122 04/25/2018 0018   CHOL 95 (L) 03/20/2017 0728   TRIG 53 04/25/2018 0018   HDL 59 04/25/2018 0018   HDL 58 03/20/2017 0728   CHOLHDL 2.1 04/25/2018 0018   VLDL 11 04/25/2018 0018   LDLCALC 52 04/25/2018 0018   LDLCALC 28 03/20/2017 0728      Many patients benefit from treatment even if their cholesterol is at goal.  Goal: Total Cholesterol (CHOL) less than 160  Goal:  Triglycerides (TRIG) less than 150  Goal:  HDL greater than 40  Goal:  LDL (LDLCALC) less than 100   BLOOD PRESSURE American Stroke Association blood pressure target is less that 120/80 mm/Hg  Your discharge blood pressure is:  BP: 136/89  Monitor your blood pressure  Limit your salt and alcohol intake  Many individuals will require more than one medication for high blood pressure  DIABETES (A1c is a blood sugar average for last 3 months) Goal  HGBA1c is under 7% (HBGA1c is blood sugar average for last 3 months)  Diabetes:    Lab Results  Component Value Date   HGBA1C 6.3 (H) 04/25/2018     Your HGBA1c can be lowered with medications, healthy diet, and exercise.  Check your blood sugar as directed by your physician  Call your physician if you experience unexplained or low blood sugars.  PHYSICAL ACTIVITY/REHABILITATION Goal is 30 minutes at least 4 days per week  Activity: Increase activity slowly, Therapies: Physical Therapy: Home Health Return to work:   Activity decreases your risk of heart attack and stroke and makes your heart stronger.  It helps control your weight and blood pressure; helps you relax and can improve your mood.  Participate in a regular exercise program.  Talk with your doctor about the best form of exercise for you (dancing, walking, swimming, cycling).  DIET/WEIGHT Goal is to maintain a healthy weight  Your discharge diet is:  Diet Order            Diet Carb Modified Fluid consistency: Thin; Room service appropriate? Yes  Diet effective now              liquids Your height is:  Height: 5\' 5"  (165.1 cm) Your current weight is: Weight: 57.3 kg Your Body Mass Index (BMI) is:  BMI (Calculated): 21.02  Following the type of diet specifically designed for you will help prevent another stroke.  Your goal weight range is:    Your goal  Body Mass Index (BMI) is 19-24.  Healthy food habits can help reduce 3 risk factors for stroke:  High cholesterol, hypertension, and excess weight.  RESOURCES Stroke/Support Group:  Call [phone]   STROKE EDUCATION PROVIDED/REVIEWED AND GIVEN TO PATIENT Stroke warning signs and symptoms How to activate emergency medical system (call 911). Medications prescribed at discharge. Need for follow-up after discharge. Personal risk factors for stroke. Pneumonia vaccine given:  Flu vaccine given:  My questions have been answered, the writing is legible, and I  understand these instructions.  I will adhere to these goals & educational materials that have been provided to me after my discharge from the hospital.   __ Bathe/dress with assistance ___ Walk Independently    ___ Shower independently ___ Walk with assistance    ___ Shower with assistance ___ No alcohol     ___ Return to work/school ________  Special Instructions:     COMMUNITY REFERRALS UPON DISCHARGE:    Home Health:   PT & OT    Agency:[facility]   [place]   Date of last service:05/07/2018  Medical Equipment/Items Ordered:NO NEEDS   My questions have been answered and I understand these instructions. I will adhere to these goals and the provided educational materials after my discharge from the hospital.  Patient/Caregiver Signature _______________________________ Date __________  Clinician Signature _______________________________________ Date __________  Please bring this form and your medication list with you to all your follow-up doctor's appointments.   Information on my medicine - ELIQUIS (apixaban)  This medication education was reviewed with me or my healthcare representative as part of my discharge preparation.  The pharmacist that spoke with me during my hospital stay was:  Onnie Boer, RPH-CPP  Why was Eliquis prescribed for you? Eliquis was prescribed for you to reduce the risk of a blood clot forming that can cause a stroke if you have a medical condition called atrial fibrillation (a type of irregular heartbeat).  What do You need to know about Eliquis ? Take your Eliquis TWICE DAILY - one tablet in the morning and one tablet in the evening with or without food. If you have difficulty swallowing the tablet whole please discuss with your pharmacist how to take the medication safely.  Take Eliquis exactly as prescribed by your doctor and DO NOT stop taking Eliquis without talking to the doctor who prescribed the medication.  Stopping may  increase your risk of developing a stroke.  Refill your prescription before you run out.  After discharge, you should have regular check-up appointments with your healthcare provider that is prescribing your Eliquis.  In the future your dose may need to be changed if your kidney function or weight changes by a significant amount or as you get older.  What do you do if you miss a dose? If you miss a dose, take it as soon as you remember on the same day and resume taking twice daily.  Do not take more than one dose of ELIQUIS at the same time to make up a missed dose.  Important Safety Information A possible side effect of Eliquis is bleeding. You should call your healthcare provider right away if you experience any of the following: ? Bleeding from an injury or your nose that does not stop. ? Unusual colored urine (red or dark brown) or unusual colored stools (red or black). ? Unusual bruising for unknown reasons. ? A serious fall or if you hit your head (even if there is no bleeding).  Some  medicines may interact with Eliquis and might increase your risk of bleeding or clotting while on Eliquis. To help avoid this, consult your healthcare provider or pharmacist prior to using any new prescription or non-prescription medications, including herbals, vitamins, non-steroidal anti-inflammatory drugs (NSAIDs) and supplements.  This website has more information on Eliquis (apixaban): http://[email or web address].com/eliquis/home

## 2018-05-06 NOTE — Progress Notes (Signed)
Occupational Therapy Discharge Summary  Patient Details  Name: Minka Knight MRN: [id number] Date of Birth: 09/19/1935  Today's Date: 05/06/2018 OT Individual Time: 0800-0900 OT Individual Time Calculation (min): 60 min    Patient has met 9 of 12 long term goals due to improved activity tolerance, improved balance, ability to compensate for deficits, functional use of  LEFT upper extremity, improved attention, improved awareness and improved coordination.  Patient to discharge at overall Supervision level for dynamic standing/mobility activities.  Patient's son is aware of her current fall risk and needs and is independent to provide the necessary physical assistance at discharge.    Reasons goals not met:   Patient continues to be a high fall risk and supervision level is recommended for shower transfers and dynamic standing activities such as home management and cooking.    Recommendation:  Patient will benefit from ongoing skilled OT services in home health setting to continue to advance functional skills in the area of BADL and iADL.  Equipment: No equipment provided  Reasons for discharge: treatment goals met  Patient/family agrees with progress made and goals achieved: Yes  OT Discharge Precautions/Restrictions  Precautions Precautions: Fall Restrictions Weight Bearing Restrictions: No General  Patient in bed upon arrival and eager to participate in therapy session.  She completed all gathering of items, functional transfers, ambulation in room with RW, shower/bathing and dressing tasks with appropriate assistive devices and good overall safety at a supervision level today.  When seated all tasks are mod I/independent but she continues to be unsteady in stance limiting overall safety.  She plans to return home with son to assist at times.  Reviewed options for sitting for various tasks and limiting need for carrying/moving objects that could potentially challenge balance.  Patient  with good verbal safety during this session.  She returned to recliner at close of session and stated that she is happy with her progress.   Vital Signs Therapy Vitals Temp: 97.6 F (36.4 C) Temp Source: Oral Pulse Rate: 70 Resp: 17 BP: 138/71 Patient Position (if appropriate): Sitting Oxygen Therapy O2 Device: Room Air Pain Pain Assessment Pain Scale: 0-10 Pain Score: 0-No pain ADL ADL Equipment Provided: Other (comment)(patient owns reacher, shoe assist, sock aide and is using these items in rehab setting) Eating: Independent Where Assessed-Eating: Chair Grooming: Modified independent(encourage sitting for tasks as able due to fall risk) Where Assessed-Grooming: Standing at sink Upper Body Bathing: Modified independent Where Assessed-Upper Body Bathing: Shower Lower Body Bathing: Modified independent Where Assessed-Lower Body Bathing: Shower Upper Body Dressing: Independent Where Assessed-Upper Body Dressing: Other (Comment)(seated on shower bench) Lower Body Dressing: Modified independent Where Assessed-Lower Body Dressing: Edge of bed Toileting: Modified independent Where Assessed-Toileting: Glass blower/designer: Distant supervision Armed forces technical officer Method: Counselling psychologist: Energy manager: Close supervision Social research officer, government Method: Heritage manager: Civil engineer, contracting with back, Grab bars Vision Baseline Vision/History: Wears glasses Wears Glasses: Reading only Patient Visual Report: Other (comment)(patient notes that diplopia has resolved) Vision Assessment?: Yes Eye Alignment: Within Functional Limits Ocular Range of Motion: Within Functional Limits Alignment/Gaze Preference: Within Defined Limits Tracking/Visual Pursuits: Able to track stimulus in all quads without difficulty Perception  Perception: Within Functional Limits Praxis   Cognition Overall Cognitive Status: Within Functional Limits for  tasks assessed Arousal/Alertness: Awake/alert Orientation Level: Oriented X4 Attention: Alternating Alternating Attention: Appears intact Memory: Impaired Memory Impairment: Decreased long term memory;Decreased recall of new information Awareness: Appears intact Problem Solving: Appears intact Sensation Sensation Additional Comments:  UEs grossly intact for ADL  Coordination Coordination and Movement Description: Left UE able to manipulate items for ADL tasks without difficulty Motor  Motor Motor - Discharge Observations: generalized deconditioning Mobility     Trunk/Postural Assessment     Balance Balance Balance Assessed: Yes Berg Balance Test Sit to Stand: Able to stand  independently using hands Standing Unsupported: Able to stand 2 minutes with supervision Sitting with Back Unsupported but Feet Supported on Floor or Stool: Able to sit safely and securely 2 minutes Stand to Sit: Sits safely with minimal use of hands Transfers: Able to transfer safely, definite need of hands Standing Unsupported with Eyes Closed: Able to stand 10 seconds with supervision Standing Ubsupported with Feet Together: Able to place feet together independently and stand for 1 minute with supervision From Standing, Reach Forward with Outstretched Arm: Can reach forward >12 cm safely (5") From Standing Position, Pick up Object from Floor: Able to pick up shoe, needs supervision From Standing Position, Turn to Look Behind Over each Shoulder: Turn sideways only but maintains balance Turn 360 Degrees: Able to turn 360 degrees safely but slowly Standing Unsupported, Alternately Place Feet on Step/Stool: Able to complete >2 steps/needs minimal assist Standing Unsupported, One Foot in Front: Able to take small step independently and hold 30 seconds Standing on One Leg: Tries to lift leg/unable to hold 3 seconds but remains standing independently Total Score: 37 Static Sitting Balance Static Sitting - Level  of Assistance: 7: Independent Dynamic Sitting Balance Dynamic Sitting - Level of Assistance: 6: Modified independent (Device/Increase time) Static Standing Balance Static Standing - Level of Assistance: 6: Modified independent (Device/Increase time) Dynamic Standing Balance Dynamic Standing - Level of Assistance: 5: Stand by assistance Extremity/Trunk Assessment RUE Assessment RUE Assessment: Within Functional Limits LUE Assessment LUE Assessment: Within Functional Limits   Meredith Gonzalez 05/06/2018, 4:57 PM

## 2018-05-06 NOTE — Discharge Summary (Signed)
Meredith Gonzalez, [facility] MEDICAL RECORD PY:[id number] ACCOUNT [id number] DATE OF BIRTH:1935-11-14 FACILITY: [facility] LOCATION: [facility] PHYSICIAN:ANDREW Letta Pate, MD  DISCHARGE SUMMARY  DATE OF DISCHARGE:  05/07/2018  DISCHARGE DIAGNOSES: 1.  Subcortical brain stem cerebrovascular accident. 2.  Deep venous thrombosis prophylaxis with Eliquis. 3.  Pain management. 4.  Atrial fibrillation. 5.  Diastolic congestive heart failure. 6.  Hypothyroidism. 7.  Hyperlipidemia. 8.  Diet-controlled diabetes mellitus. 9.  Constipation. 10.  Insomnia. 11.  Hypertension 12.  Anemia of chronic disease.  HOSPITAL COURSE:  This is an 83 year old right-handed female with history of diet-controlled diabetes mellitus, CAD, CVA in 2000, hypertension, diastolic congestive heart failure, atrial fibrillation with pacemaker, maintained on Eliquis.  Per chart  review, lives alone at [facility] in [place], [place].  Independent with assistive device.  She has a son in the area.  Presented 04/06/2018 with left-sided weakness and ataxia.  Cranial CT scan negative.  The  patient did not receive tPA.  CT perfusion scan as well as angiogram of head and neck showed moderate to severe 60% to 70% right carotid bifurcation stenosis.  Follow up renal CT scan 04/13/2018 negative.  MRI not completed due to pacemaker.   Echocardiogram with ejection fraction of 71%, normal systolic function.  Vascular surgery consulted in regard to carotid stenosis.  PLAN:  Carotid endarterectomy after recovery of CVA to follow up with outpatient.  The patient remained on Eliquis for history of atrial fibrillation, CVA prophylaxis.  Therapy evaluations completed, and the patient was admitted for a comprehensive rehab  program.  PAST MEDICAL HISTORY:  See discharge diagnoses.  SOCIAL HISTORY:  Lives alone at [facility] in [place], [place].  FUNCTIONAL STATUS:   Upon admission to rehab services was mod/max assist 50 feet rolling walker, minimal assist sit to stand, minimal assist ADLs.  PHYSICAL EXAMINATION: VITAL SIGNS:  Blood pressure 158/69, pulse 70, temperature 98, respirations 18. GENERAL:  Alert female in no acute distress.  Provides her name, age, date of birth. HEENT:  EOMs intact. NECK:  Supple, nontender, no JVD. CARDIOVASCULAR:  Rate controlled. ABDOMEN:  Soft, nontender, good bowel sounds. LUNGS:  Clear to auscultation without wheeze.  REHABILITATION HOSPITAL COURSE:  The patient was admitted to inpatient rehab services.  Therapies initiated on a 3-hour daily basis, consisting of physical therapy, occupational therapy and rehab nursing.  The following issues were addressed during  patient's rehab stay.  Pertaining to the patient's subcortical CVA, MRI not completed due to pacemaker.  She remained on Eliquis for history of atrial fibrillation.  Cardiac rate controlled.  Follow up per cardiology services.  Blood pressure is overall  monitored.  She continued on Lipitor for hyperlipidemia.  Noted findings of right carotid stenosis.  PLAN:  Followup outpatient vascular surgery to consider carotid endarterectomy.  Bouts of insomnia.  Doing well with trazodone.  Diet-controlled diabetes mellitus.  Her blood sugar checks have been discontinued.  Diet ongoing as directed with diabetic  teaching.  The patient received weekly collaborative interdisciplinary team conferences to discuss estimated length of stay, family teaching, any barriers to discharge.  Ambulating 60 feet contact guard, performed self-hygiene with supervision.  Performs  4 transfers with minimal assist, verbal cues, up and down stairs with supervision.  Gathers belongings for activities of daily living and homemaking.  Plan was to return back to independent living.  DISCHARGE MEDICATIONS:  Included amiodarone 200 mg p.o. daily, Eliquis 2.5 mg p.o. b.i.d., Lipitor 40 mg p.o. daily,  Lasix 10 mg p.o. daily, Neurontin 300 mg daily and 600 mg at bedtime, Synthroid 50 mcg p.o. daily, trazodone 25 mg p.o. at bedtime, Albuterol inhaler prn, Robaxin 250 mg every 8 hours as needed spasms.  DIET:  Diabetic diet.  FOLLOWUP:  The patient would follow up with Dr. Alysia Penna at the outpatient rehab service office as advised; Dr. Sherren Mocha Early followup to discuss right carotid stenosis, question surgical intervention; Dr. Dorris Carnes cardiology services; Dr. Sinclair Ship, [place], [facility], medical management.  LN/NUANCE D:05/06/2018 T:05/06/2018 JOB:[id number]

## 2018-05-06 NOTE — Patient Care Conference (Signed)
Inpatient RehabilitationTeam Conference and Plan of Care Update Date: 05/06/2018   Time: 10:40 Am    Patient Name: Meredith Gonzalez      Medical Record Number: [id number]  Date of Birth: 08-26-1935 Sex: Female         Room/Bed: 4W22C/4W22C-01 Payor Info: Payor: MEDICARE / Plan: MEDICARE PART A AND B / Product Type: *No Product type* /    Admitting Diagnosis: R CVA  Admit Date/Time:  04/27/2018  3:35 PM Admission Comments: No comment available   Primary Diagnosis:  <principal problem not specified> Principal Problem: <principal problem not specified>  Patient Active Problem List   Diagnosis Date Noted  . Chronic diastolic congestive heart failure ([place])   . Anemia of chronic disease   . Hypoalbuminemia due to protein-calorie malnutrition ([place])   . Labile blood pressure   . Atrial fibrillation ([place])   . Chronic neck pain   . Subcortical infarction ([place]) 04/27/2018  . CVA (cerebral vascular accident) ([place]) 04/26/2018  . TIA (transient ischemic attack) 04/24/2018  . CAD (coronary artery disease) 08/07/2016  . (HFpEF) heart failure with preserved ejection fraction ([place]) 08/07/2016  . Ischemic cardiomyopathy 08/07/2016  . Chest pain   . NSTEMI (non-ST elevated myocardial infarction) ([place])   . Status post coronary artery stent placement   . Abnormal stress test   . SOB (shortness of breath)   . Multifocal pneumonia 07/13/2016  . Elevated troponin 07/13/2016  . Leukocytosis   . Pacemaker 05/17/2014  . Complications, pacemaker cardiac, mechanical 12/31/2012  . Edema extremities 10/08/2012  . Persistent atrial fibrillation 10/08/2012  . Hypertension 10/08/2012  . Hypothyroidism 10/08/2012  . Neuropathy 10/08/2012  . Diabetes ([place]) 10/08/2012  . Depression 09/08/2012  . Anemia 08/08/2012  . Knee fracture, left 08/07/2012  . Recurrent falls 08/07/2012  . Syncope 08/07/2012  . SDH (subdural hematoma) ([place]) 08/07/2012    Expected Discharge Date: Expected Discharge Date:  05/07/18  Team Members Present: Physician leading conference: Dr. Alysia Penna Social Worker Present: Ovidio Kin, LCSW Nurse Present: Ellison Carwin, LPN PT Present: Lavone Nian, PT OT Present: Other (comment)(Stacy kinder-OT) SLP Present: Charolett Bumpers, SLP PPS Coordinator present : Gunnar Fusi     Current Status/Progress Goal Weekly Team Focus  Medical   BP improving , neck pain improving  Reduce fall risk  Cont rehab PT, OT,   Bowel/Bladder   pt is continent of B/B LBm 02/11  remain continent of B/B normal bowel pattern  assist with toileting prn   Swallow/Nutrition/ Hydration             ADL's   supervision for grooming and UB selfcare with superviison for LB selfcare as well as transfers.  Still needs min assist without use of the RW for functional mobiity.   downgraded to supervision overall  selfcare retraining, balance retraining, neuromuscular re-education, pt/family education   Mobility   CGA overall with RW, impaired balance  downgraded to supervision overall with LRAD 2/2 slow progress & impaired balance, CGA for stairs for strengthening with PT  pt education & d/c planning, transfers, gait, balance, endurance, strengthening, NMR   Communication             Safety/Cognition/ Behavioral Observations            Pain   chronic shoulder pain managed with Kpad and tylenol  pain ,2/10  assess pain q shift and prn medicate prn   Skin   healed abrasions to ankles  skin remain intact and free of infection  assess  qshift and prn      *See Care Plan and progress notes for long and short-term goals.     Barriers to Discharge  Current Status/Progress Possible Resolutions Date Resolved   Physician    Medical stability     progressing  Cont rehab program      Nursing                  PT  Decreased caregiver support  lives in independent living              OT                  SLP                SW                Discharge Planning/Teaching Needs:   Pt and son feel she is back to her baseline and want to try it at home before having to pursue SNF or ALF. pt and son feel the trazodone made her groggy in the am and her balance was affected. Pt will not be driiving and will be getting a Lifeline      Team Discussion:  Progressing toward her supervision-mod/i level. PT feels pt needs supervision level. Shoulder pain managed with k-pad and tylenol. Pt and son plan for her to return home feel back to baseline with ambulation.  Does not want to consider ALF or NH. Prepare for DC tomorrow  Revisions to Treatment Plan:  DC 2/13    Continued Need for Acute Rehabilitation Level of Care: The patient requires daily medical management by a physician with specialized training in physical medicine and rehabilitation for the following conditions: Daily direction of a multidisciplinary physical rehabilitation program to ensure safe treatment while eliciting the highest outcome that is of practical value to the patient.: Yes Daily medical management of patient stability for increased activity during participation in an intensive rehabilitation regime.: Yes Daily analysis of laboratory values and/or radiology reports with any subsequent need for medication adjustment of medical intervention for : Neurological problems   I attest that I was present, lead the team conference, and concur with the assessment and plan of the team.   Elease Hashimoto 05/06/2018, 1:11 PM

## 2018-05-06 NOTE — Progress Notes (Signed)
Physical Therapy Discharge Summary  Patient Details  Name: Meredith Gonzalez MRN: [id number] Date of Birth: Oct 19, 1935  Today's Date: 05/06/2018   Patient has met 10 of 10 long term goals due to improved activity tolerance, improved balance, improved postural control, increased strength, ability to compensate for deficits, functional use of  right upper extremity, right lower extremity, left upper extremity and left lower extremity, improved awareness and improved coordination.  Patient to discharge at an ambulatory level supervision with RW.     No family members have been present for hands on training or education during PT sessions. This therapist has educated pt on goals being downgraded to supervision at the beginning of the week, as pt is making slow progress towards independence and demonstrates ongoing balance deficits. Pt has also been educated on recommendation to use RW instead of rollator upon d/c, as well as inability to drive unless cleared by MD. Pt has experienced multiple falls in the past years, with recent fall in September 2019 resulting in cervical fractures and it is anticipated that if pt does not go home with supervision assist she will experience additional falls, and pt currently requires min assist to perform a floor transfer.   Reasons goals not met: n/a  Recommendation:  Patient will benefit from ongoing skilled PT services in home health setting to continue to advance safe functional mobility, address ongoing impairments in balance, awareness, floor transfer, strengthening & endurance, and minimize fall risk.  Equipment: No equipment provided, pt reports she already has a RW  Reasons for discharge: treatment goals met and discharge from hospital  Patient/family agrees with progress made and goals achieved: Yes  PT Discharge Precautions/Restrictions Precautions Precautions: Fall Restrictions Weight Bearing Restrictions: No  Vision/Perception  Pt wears glasses  at baseline for reading only. Pt with hx of cataract removal. Pt reports diplopia has resolved.   Cognition Overall Cognitive Status: Within Functional Limits for tasks assessed Arousal/Alertness: Awake/alert Orientation Level: Oriented X4 Memory: Impaired Memory Impairment: Decreased long term memory;Decreased recall of new information Decreased Long Term Memory: Verbal complex Reasoning: Impaired Safety/Judgment: Impaired Comments: pt reports hx of falls and is aware of current balance deficits but reports she feels she can return to living independently 2/2 impaired awareness  Motor  Motor Motor: Abnormal postural alignment and control Motor - Discharge Observations: generalized deconditioning   Mobility Bed Mobility Bed Mobility: Rolling Right;Rolling Left;Supine to Sit;Sit to Supine Rolling Right: Independent Rolling Left: Independent Supine to Sit: Independent Sit to Supine: Independent Transfers Transfers: Sit to Stand;Stand to Sit Sit to Stand: Supervision/Verbal cueing Stand to Sit: Supervision/Verbal cueing Transfer (Assistive device): Rolling walker  Locomotion  Gait Ambulation: Yes Gait Assistance: Supervision/Verbal cueing Gait Distance (Feet): 150 Feet Assistive device: Rolling walker Gait Assistance Details: verbal cuing to ambulate within base of RW vs leaning forward onto AD Gait Gait: Yes Gait Pattern: Impaired Gait Pattern: (decreased BLE step length, decreased stride length, decreased weight shifting) Stairs / Additional Locomotion Stairs: Yes Stairs Assistance: Supervision/Verbal cueing Stair Management Technique: Two rails Number of Stairs: 24 Height of Stairs: (6" + 3") Ramp: Supervision/Verbal cueing(ambulatory with RW) Wheelchair Mobility Wheelchair Mobility: No   Trunk/Postural Assessment  Cervical Assessment Cervical Assessment: Exceptions to [facility] Cervical AROM Overall Cervical AROM Comments: limited AROM due to recent cervical  fracture 11/2017 Thoracic Assessment Thoracic Assessment: Exceptions to WFL(kyphosis) Lumbar Assessment Lumbar Assessment: Exceptions to WFL(posterior pelvic tilt) Postural Control Postural Control: Deficits on evaluation(absent anterior/posterior hip and ankle strategies, pt with frequent anterior LOB) Righting Reactions: delayed  Protective Responses: delayed   Balance Berg Balance Test Sit to Stand: Able to stand  independently using hands Standing Unsupported: Able to stand 2 minutes with supervision Sitting with Back Unsupported but Feet Supported on Floor or Stool: Able to sit safely and securely 2 minutes Stand to Sit: Sits safely with minimal use of hands Transfers: Able to transfer safely, definite need of hands Standing Unsupported with Eyes Closed: Able to stand 10 seconds with supervision Standing Ubsupported with Feet Together: Able to place feet together independently and stand for 1 minute with supervision From Standing, Reach Forward with Outstretched Arm: Can reach forward >12 cm safely (5") From Standing Position, Pick up Object from Floor: Able to pick up shoe, needs supervision From Standing Position, Turn to Look Behind Over each Shoulder: Turn sideways only but maintains balance Turn 360 Degrees: Able to turn 360 degrees safely but slowly Standing Unsupported, Alternately Place Feet on Step/Stool: Able to complete >2 steps/needs minimal assist Standing Unsupported, One Foot in Front: Able to take small step independently and hold 30 seconds Standing on One Leg: Tries to lift leg/unable to hold 3 seconds but remains standing independently Total Score: 37   Extremity Assessment  RUE Assessment RUE Assessment: Within Functional Limits LUE Assessment LUE Assessment: Within Functional Limits RLE Assessment RLE Assessment: Within Functional Limits General Strength Comments: not formally tested LLE Assessment LLE Assessment: Within Functional Limits General Strength  Comments: not formally tested    [name] 05/06/2018, 3:48 PM

## 2018-05-06 NOTE — Progress Notes (Signed)
Meredith Gonzalez PHYSICAL MEDICINE & REHABILITATION PROGRESS NOTE   Subjective/Complaints: Occ cough , feels like she cannot get phlegm out Labs reviewed ROS: Patient denies CP, SOB, N/V/D  Objective:   No results found. Recent Labs    05/04/18 0500 05/05/18 0516  WBC 5.6 6.1  HGB 9.8* 10.4*  HCT 33.9* 34.1*  PLT 227 237   Recent Labs    05/05/18 0516  NA 142  K 4.0  CL 111  CO2 24  GLUCOSE 104*  BUN 23  CREATININE 0.86  CALCIUM 8.4*    Intake/Output Summary (Last 24 hours) at 05/06/2018 9169 Last data filed at 05/05/2018 2106 Gross per 24 hour  Intake 1600 ml  Output -  Net 1600 ml     Physical Exam: Vital Signs Blood pressure 127/60, pulse 66, temperature 98.4 F (36.9 C), temperature source Oral, resp. rate 16, height _0  (1.651 m), weight 59.3 kg, SpO2 95 %.   Constitutional: No distress . Vital signs reviewed. HEENT: EOMI, oral membranes moist Neck: supple Cardiovascular: RRR without murmur. No JVD    Respiratory: CTA , wheezes at bases and rhonchi at bases   GI: BS +, non-tender, non-distended  Musc: No edema or tenderness in extremities. Skin: No evidence of breakdown, no evidence of rash Neurologic: Alert and oriented Motor: 5/5 RUE/RLE.  4 to 4+/5 LUE/LLE, unchanged.  Psych: anxious.  Assessment/Plan: 1. Functional deficits secondary to left-sided weaknesssecondary to subcortical/brainstem CVA which require 3+ hours per day of interdisciplinary therapy in a comprehensive inpatient rehab setting.  Physiatrist is providing close team supervision and 24 hour management of active medical problems listed below.  Physiatrist and rehab team continue to assess barriers to discharge/monitor patient progress toward functional and medical goals  Care Tool:  Bathing    Body parts bathed by patient: Right arm, Left arm, Chest, Abdomen, Front perineal area, Face, Buttocks, Right upper leg, Left upper leg, Right lower leg, Left lower leg          Bathing assist Assist Level: Contact Guard/Touching assist     Upper Body Dressing/Undressing Upper body dressing   What is the patient wearing?: Pull over shirt    Upper body assist Assist Level: Set up assist    Lower Body Dressing/Undressing Lower body dressing      What is the patient wearing?: Pants, Underwear/pull up     Lower body assist Assist for lower body dressing: Contact Guard/Touching assist     Toileting Toileting    Toileting assist Assist for toileting: Supervision/Verbal cueing     Transfers Chair/bed transfer  Transfers assist     Chair/bed transfer assist level: Supervision/Verbal cueing     Locomotion Ambulation   Ambulation assist      Assist level: Supervision/Verbal cueing Assistive device: Walker-rolling Max distance: 150 ft   Walk 10 feet activity   Assist     Assist level: Supervision/Verbal cueing Assistive device: Walker-rolling   Walk 50 feet activity   Assist    Assist level: Supervision/Verbal cueing Assistive device: Walker-rolling    Walk 150 feet activity   Assist    Assist level: Supervision/Verbal cueing Assistive device: Walker-rolling    Walk 10 feet on uneven surface  activity   Assist     Assist level: Minimal Assistance - Patient > 75% Assistive device: Aeronautical engineer Will patient use wheelchair at discharge?: No Type of Wheelchair: Manual    Wheelchair assist level: Contact Guard/Touching assist Max wheelchair distance: 100  ft    Wheelchair 50 feet with 2 turns activity    Assist        Assist Level: Contact Guard/Touching assist   Wheelchair 150 feet activity     Assist Wheelchair 150 feet activity did not occur: Safety/medical concerns          Medical Problem List and Plan: 1.Ataxic gait with left-sided weaknesssecondary to subcortical/brainstem CVA. MRI not completed due to pacemaker Continue CIR, Team  conference today please see physician documentation under team conference tab, met with team face-to-face to discuss problems,progress, and goals. Formulized individual treatment plan based on medical history, underlying problem and comorbidities. - vascular surgery has followed up and will see in 1 year as stroke was felt to be posterior circulation   2. DVT Prophylaxis/Anticoagulation: Eliquis 3. Pain Management:Neurontin 300 mg daily at 600 mg daily at bedtime  -tylenol, kpad for neck pain ineffective (hx of C1-C2 fx's per CT 12/2017)  - robaxin 257m q8 prn spasms  -K pad  -Improving 4. Mood:Provide emotional support 5. Neuropsych: This patientiscapable of making decisions on herown behalf. 6. Skin/Wound Care:Routine skin checks 7. Fluids/Electrolytes/Nutrition:Routine in and out's 8. Atrial fibrillation with history of pacemaker. Continue amiodarone 200 mg daily. -on Chronic Eliquis Was on pindolol at home will monitor HR Vitals:   05/05/18 1922 05/06/18 0427  BP: (!) 141/65 127/60  Pulse: 79 66  Resp: 14 16  Temp: 98.3 F (36.8 C) 98.4 F (36.9 C)  SpO2: 93% 95%  HR controlled 2/12 9. Diastolic congestive heart failure. Monitor for any signs of fluid overload Filed Weights   05/03/18 0259 05/04/18 0309 05/06/18 0424  Weight: 59.1 kg 58.9 kg 59.3 kg   Slowly trending up, Lasix ordered 10. Hypothyroidism. Synthroid 11. Hyperlipidemia. Lipitor 12. Diet controlled diabetes mellitus. Blood sugar checks discontinuedas sugars well controlled 13. Constipation. Laxative assistance, moving bowels  -simethicone [id number] prn for gas  14.  Insomnia - took "4049melatonin" at home but didn't help  -changed trazodone to scheduled and add back up dose prn  Hangover effect, took no meds last noc and slept well per pt 15.  HTN controlled only on lasix   Vitals:   05/05/18 1922 05/06/18 0427  BP: (!) 141/65 127/60  Pulse: 79 66  Resp: 14 16  Temp:  98.3 F (36.8 C) 98.4 F (36.9 C)  SpO2: 93% 95%   16.  Hypoalbuminemia  Supplement initiated on 2/8 17.  Anemia of chronic disease  Hemoglobin 10.1 on 2/4, holding at 9.8 2/10  Continue to monitor 18.  Platelets  -back up to 227k 2/10   LOS: 9 days A FACE TO FACE EVALUATION WAS PERFORMED  AndCharlett Blake12/2020, 9:25 AM

## 2018-05-06 NOTE — Progress Notes (Signed)
Social Work Patient ID: Meredith Gonzalez, female   DOB: 10-May-1935, 83 y.o.   MRN: [id number] Met with pt and spoke with son via telephone to discuss team conference progression toward her goals and plan for discharge tomorrow. Pt will give up driving and use her rolling walker instead of her rollator. Home health in place with [facility]. Has all equipment. Son to be here tomorrow at 11:00 to transport pt home.

## 2018-05-06 NOTE — Progress Notes (Signed)
Physical Therapy Session Note  Patient Details  Name: Meredith Gonzalez MRN: [id number] Date of Birth: 03-Nov-1935  Today's Date: 05/06/2018 PT Individual Time: 0935-1030 and 1300-1420 PT Individual Time Calculation (min): 55 min and 80 min  Short Term Goals: Week 2:  PT Short Term Goal 1 (Week 2): STG = LTG due to ELOS.  Skilled Therapeutic Interventions/Progress Updates:   Session 1: Pt received sitting up in recliner and agreeable to tx. Pt sits<>stands with supervision and RW and ambulates to toilet with RW. Pt performs toilet transfer with supervision, voids bladder and performs self-hygiene with supervision. Pt ambulates around unit with RW and distant supervision. Pt performs car transfer in truck-simulated vehicle with CGA and verbal cuing for proper sequencing and hand placement. Pt performs bed mobility independently and sits<>stands from low compliant couch with supervision. Pt ascends/descends 12 stairs with 2 handrails with supervision and self-selected step-over-step pattern. Pt performs floor transfer with min assist to get leg up from tall kneeling position. Therapist educated pt on position to crawl to stable surface if pt were to experience a fall at home. Pt performs lateral step ups onto 3" step with BUE>1 UE>no UE for BLE strengthening and balance challenge with supervision>min assist on LLE, supervision>CGA on RLE. Pt left sitting up in recliner with chair pad alarm on and all needs in reach.   No c/o pain during session.  Session 2: Pt received sitting up in recliner and agreeable to tx. Pt ambulates around unit with RW and distant supervision. Pt completes Berg Balance Test and scores 37/56 indicating a moderate fall risk. Therapist educates pt on results of balance test and recommendation for supervision at home. Pt becomes upset with recommendation and states that she "will not have anyone in her home and will not go to assisted living." Therapist reassures pt and informs need  for use of RW in home at all times. Therapist also educates on carrying items with RW in walker bag. Pt completes TUG test with RW and average time of 17.75 s indicating that pt is at risk for falls. Therapist educates pt on results of this test and re-emphasizes need for supervision at home. Pt performs step-up onto curb x 2 with RW and supervision with min verbal cuing for safety with RW. Pt stands on foam and performs Wii bowling activity to challenge balance and coordination with supervision>min assist with 2 LOB throughout game. Pt ambulates to toilet with RW and performs toilet transfer with supervision. Pt voids bladder and performs self-hygiene tasks with supervision. Pt left sitting up in recliner with chair pad alarm on and all needs in reach.  No c/o pain during session.  Therapy Documentation Precautions:  Precautions Precautions: Fall Precaution Comments: ataxic Restrictions Weight Bearing Restrictions: No    Therapy/Group: Individual Therapy  Saed Hudlow 05/06/2018, 10:42 AM

## 2018-05-07 MED ORDER — ALBUTEROL SULFATE HFA 108 (90 BASE) MCG/ACT IN AERS: 2.0000 | INHALATION_SPRAY | Freq: Four times a day (QID) | RESPIRATORY_TRACT | 1 refills | Status: AC | PRN

## 2018-05-07 MED ORDER — METHOCARBAMOL 500 MG PO TABS: 250.0000 mg | ORAL_TABLET | Freq: Three times a day (TID) | ORAL | 0 refills | Status: DC | PRN

## 2018-05-07 MED ORDER — LEVOTHYROXINE SODIUM 50 MCG PO TABS: 50.0000 ug | ORAL_TABLET | Freq: Every day | ORAL | 0 refills | Status: DC

## 2018-05-07 MED ORDER — GABAPENTIN 300 MG PO CAPS: 300.0000 mg | ORAL_CAPSULE | ORAL | 0 refills | Status: DC

## 2018-05-07 MED ORDER — ATORVASTATIN CALCIUM 40 MG PO TABS: 40.0000 mg | ORAL_TABLET | Freq: Every day | ORAL | 3 refills | Status: DC

## 2018-05-07 MED ORDER — APIXABAN 2.5 MG PO TABS: 2.5000 mg | ORAL_TABLET | Freq: Two times a day (BID) | ORAL | 1 refills | Status: DC

## 2018-05-07 MED ORDER — AMIODARONE HCL 200 MG PO TABS: 200.0000 mg | ORAL_TABLET | ORAL | 3 refills | Status: DC

## 2018-05-07 MED ORDER — TRAZODONE HCL 50 MG PO TABS: 25.0000 mg | ORAL_TABLET | Freq: Every day | ORAL | 0 refills | Status: DC

## 2018-05-07 MED ORDER — AMIODARONE HCL 200 MG PO TABS: 200.0000 mg | ORAL_TABLET | ORAL | 3 refills | Status: AC

## 2018-05-07 MED ORDER — FERROUS SULFATE 325 (65 FE) MG PO TABS: 325.0000 mg | ORAL_TABLET | Freq: Two times a day (BID) | ORAL | 0 refills | Status: AC

## 2018-05-07 MED ORDER — FUROSEMIDE 20 MG PO TABS: 10.0000 mg | ORAL_TABLET | Freq: Every day | ORAL | 1 refills | Status: DC

## 2018-05-07 NOTE — Progress Notes (Signed)
Pt DC summary complete by PA. No further questions or concerns noted. Pt belongings packed earlier by nurse. Pt excited about DC. Son present. Will transport to main lobby for DC.  Erie Noe, LPN

## 2018-05-07 NOTE — Plan of Care (Signed)
  Problem: Consults Goal: RH STROKE PATIENT EDUCATION Description See Patient Education module for education specifics  Outcome: Completed/Met   Problem: RH BOWEL ELIMINATION Goal: RH STG MANAGE BOWEL WITH ASSISTANCE Description STG Manage Bowel with min. Assistance.  Outcome: Completed/Met   Problem: RH BLADDER ELIMINATION Goal: RH STG MANAGE BLADDER WITH ASSISTANCE Description STG Manage Bladder With min. Assistance  Outcome: Completed/Met   Problem: RH SKIN INTEGRITY Goal: RH STG SKIN FREE OF INFECTION/BREAKDOWN Description With min. Assist.  Outcome: Completed/Met   Problem: RH SAFETY Goal: RH STG ADHERE TO SAFETY PRECAUTIONS W/ASSISTANCE/DEVICE Description STG Adhere to Safety Precautions With min.Assistance/Device.  Outcome: Completed/Met   Problem: RH COGNITION-NURSING Goal: RH STG ANTICIPATES NEEDS/CALLS FOR ASSIST W/ASSIST/CUES Description STG Anticipates Needs/Calls for Assist With min Assistance/Cues.  Outcome: Completed/Met   Problem: RH PAIN MANAGEMENT Goal: RH STG PAIN MANAGED AT OR BELOW PT'S PAIN GOAL Description Less than 3,on 1 to 10 scale  Outcome: Completed/Met   Problem: RH KNOWLEDGE DEFICIT Goal: RH STG INCREASE KNOWLEDGE OF HYPERTENSION Outcome: Completed/Met Goal: RH STG INCREASE KNOWLEGDE OF HYPERLIPIDEMIA Outcome: Completed/Met Goal: RH STG INCREASE KNOWLEDGE OF STROKE PROPHYLAXIS Outcome: Completed/Met

## 2018-05-07 NOTE — Progress Notes (Signed)
Nurse in to give pt morning meds. Pt refused lasix 10mg  d/t DC today and will be traveling home and not be able to go to bathroom immediately when needed. Pt informed nurse that she has 10 mg tablets at home and would take it then. Nurse educated that may increase urination through evening and night and pt was ok with that.  Erie Noe, LPN

## 2018-05-07 NOTE — Progress Notes (Signed)
Social Work  Discharge Note  The overall goal for the admission was met for:   Discharge location: Yes-HOME TO [place]  Length of Stay: Yes-10 DAYS  Discharge activity level: Yes-SUPERVISION-MOD/I LEVEL  Home/community participation: Yes  Services provided included: MD, RD, PT, OT, SLP, RN, CM, TR, Pharmacy and SW  Financial Services: Medicare and Private Insurance: [place]  Follow-up services arranged: Home Health: [facility] and Patient/Family has no preference for HH/DME agencies  Comments (or additional information):PT AND SON AWARE OF [place]. BOTH FELT SHE WAS BACK TO BASELINE WITH HER MOBILITY AND WANT TO TRY AT HOME BEFORE HAVING TO PURSUE ALF. SHE WILL NOT DRIVE AND WILL GET A LIFELINE, HAS INFORMATION TO FOLLOW UP WITH.  Patient/Family verbalized understanding of follow-up arrangements: Yes  Individual responsible for coordination of the follow-up plan: KEVIN-SON  Confirmed correct DME delivered: Elease Hashimoto 05/07/2018    Elease Hashimoto

## 2018-05-07 NOTE — Progress Notes (Signed)
[facility] PHYSICAL MEDICINE & REHABILITATION PROGRESS NOTE   Subjective/Complaints: Still with occ cough no fever or chills ROS: Patient denies CP, SOB, N/V/D  Objective:   No results found. Recent Labs    05/05/18 0516  WBC 6.1  HGB 10.4*  HCT 34.1*  PLT 237   Recent Labs    05/05/18 0516  NA 142  K 4.0  CL 111  CO2 24  GLUCOSE 104*  BUN 23  CREATININE 0.86  CALCIUM 8.4*    Intake/Output Summary (Last 24 hours) at 05/07/2018 0826 Last data filed at 05/06/2018 1900 Gross per 24 hour  Intake 480 ml  Output -  Net 480 ml     Physical Exam: Vital Signs Blood pressure 133/75, pulse 70, temperature 98.6 F (37 C), temperature source Oral, resp. rate 19, height 5\' 5"  (1.651 m), weight 59.3 kg, SpO2 95 %.   Constitutional: No distress . Vital signs reviewed. HEENT: EOMI, oral membranes moist Neck: supple Cardiovascular: RRR without murmur. No JVD    Respiratory: CTA , wheezes at bases and rhonchi at bases   GI: BS +, non-tender, non-distended  Musc: No edema or tenderness in extremities. Skin: No evidence of breakdown, no evidence of rash Neurologic: Alert and oriented Motor: 5/5 RUE/RLE.  4 to 4+/5 LUE/LLE, unchanged.  Psych: anxious.  Assessment/Plan: 1. Functional deficits secondary to left-sided weaknesssecondary to subcortical/brainstem CVA which require 3+ hours per day of interdisciplinary therapy in a comprehensive inpatient rehab setting.  Physiatrist is providing close team supervision and 24 hour management of active medical problems listed below.  Physiatrist and rehab team continue to assess barriers to discharge/monitor patient progress toward functional and medical goals  Care Tool:  Bathing    Body parts bathed by patient: Right arm, Left arm, Chest, Abdomen, Front perineal area, Face, Buttocks, Right upper leg, Left upper leg, Right lower leg, Left lower leg         Bathing assist Assist Level: Independent with assistive device      Upper Body Dressing/Undressing Upper body dressing   What is the patient wearing?: Pull over shirt    Upper body assist Assist Level: Independent with assistive device    Lower Body Dressing/Undressing Lower body dressing      What is the patient wearing?: Pants, Underwear/pull up     Lower body assist Assist for lower body dressing: Independent with assitive device     Toileting Toileting    Toileting assist Assist for toileting: Supervision/Verbal cueing     Transfers Chair/bed transfer  Transfers assist     Chair/bed transfer assist level: Supervision/Verbal cueing     Locomotion Ambulation   Ambulation assist      Assist level: Supervision/Verbal cueing Assistive device: Walker-rolling Max distance: 150 ft   Walk 10 feet activity   Assist     Assist level: Supervision/Verbal cueing Assistive device: Walker-rolling   Walk 50 feet activity   Assist    Assist level: Supervision/Verbal cueing Assistive device: Walker-rolling    Walk 150 feet activity   Assist    Assist level: Supervision/Verbal cueing Assistive device: Walker-rolling    Walk 10 feet on uneven surface  activity   Assist     Assist level: Supervision/Verbal cueing Assistive device: Aeronautical engineer Will patient use wheelchair at discharge?: No Type of Wheelchair: Manual Wheelchair activity did not occur: N/A  Wheelchair assist level: Contact Guard/Touching assist Max wheelchair distance: 100 ft    Wheelchair 50 feet  with 2 turns activity    Assist    Wheelchair 50 feet with 2 turns activity did not occur: N/A   Assist Level: Contact Guard/Touching assist   Wheelchair 150 feet activity     Assist Wheelchair 150 feet activity did not occur: N/A          Medical Problem List and Plan: 1.Ataxic gait with left-sided weaknesssecondary to subcortical/brainstem CVA. MRI not completed due to  pacemaker Continue CIR, - vascular surgery has followed up and will see in 1 year as stroke was felt to be posterior circulation   2. DVT Prophylaxis/Anticoagulation: Eliquis 3. Pain Management:Neurontin 300 mg daily at 600 mg daily at bedtime  -tylenol, kpad for neck pain ineffective (hx of C1-C2 fx's per CT 12/2017)  - robaxin 250mg  q8 prn spasms  -K pad  -Improving 4. Mood:Provide emotional support 5. Neuropsych: This patientiscapable of making decisions on herown behalf. 6. Skin/Wound Care:Routine skin checks 7. Fluids/Electrolytes/Nutrition:Routine in and out's 8. Atrial fibrillation with history of pacemaker. Continue amiodarone 200 mg daily. -on Chronic Eliquis Was on pindolol at home will monitor HR Vitals:   05/06/18 1942 05/07/18 0500  BP: 122/66 133/75  Pulse: 63 70  Resp: 18 19  Temp: 98 F (36.7 C) 98.6 F (37 C)  SpO2: 93% 95%  HR controlled 2/12 9. Diastolic congestive heart failure. Monitor for any signs of fluid overload Filed Weights   05/03/18 0259 05/04/18 0309 05/06/18 0424  Weight: 59.1 kg 58.9 kg 59.3 kg   Stable cont low dose lasix, 10. Hypothyroidism. Synthroid 11. Hyperlipidemia. Lipitor 12. Diet controlled diabetes mellitus. Blood sugar checks discontinuedas sugars well controlled 13. Constipation. Laxative assistance, moving bowels  -simethicone 160mg  q6 prn for gas  14.  Insomnia - took "40mg  Melatonin" at home but didn't help  -changed trazodone to scheduled and add back up dose prn  Hangover effect, took no meds last noc and slept well per pt 15.  HTN controlled only on lasix   Vitals:   05/06/18 1942 05/07/18 0500  BP: 122/66 133/75  Pulse: 63 70  Resp: 18 19  Temp: 98 F (36.7 C) 98.6 F (37 C)  SpO2: 93% 95%   16.  Hypoalbuminemia  Supplement initiated on 2/8 17.  Anemia of chronic disease  Hemoglobin 10.1 on 2/4, holding at 9.8 2/10  Continue to monitor 18.  Platelets  -back up  to 227k 2/10 19.  ? Bronchitis without fever albuterol ordered, nebulizer prior to d/c and inhaler for home, f/u with PCP  LOS: 10 days A FACE TO [place] E Kirsteins 05/07/2018, 8:26 AM

## 2018-05-08 ENCOUNTER — Telehealth: Payer: Self-pay | Admitting: Registered Nurse

## 2018-05-08 NOTE — Telephone Encounter (Signed)
Transitional Care call Transitional Care Completed Transitional Care Questions Answered by Son: Meredith Gonzalez   Patient name: Meredith Gonzalez DOB: Jul 15, 1935 1. Are you/is patient experiencing any problems since coming home? No a. Are there any questions regarding any aspect of care? No 2. Are there any questions regarding medications administration/dosing? No a. Are meds being taken as prescribed? Yes b. "Patient should review meds with caller to confirm" Medication List Reviewed 3. Have there been any falls? No 4. Has Home Health been to the house and/or have they contacted you? Not Yet awaiting a call from [facility].  a. If not, have you tried to contact them? No b. Can we help you contact them? No, he was instructed to call office on [place] 05/11/2018 if he hasn't heard from [facility]. 5. Are bowels and bladder emptying properly? Yes a. Are there any unexpected incontinence issues? No b. If applicable, is patient following bowel/bladder programs? NA 6. Any fevers, problems with breathing, unexpected pain? No 7. Are there any skin problems or new areas of breakdown? No 8. Has the patient/family member arranged specialty MD follow up (ie cardiology/neurology/renal/surgical/etc.)?  HFU appointments are being scheduled.  a. Can we help arrange? NA 9. Does the patient need any other services or support that we can help arrange? No 10. Are caregivers following through as expected in assisting the patient? Yes 11. Has the patient quit smoking, drinking alcohol, or using drugs as recommended? Meredith Gonzalez reports his mother Meredith Gonzalez doesn't smoke, drink alcohol or use illicit drugs   Appointment date/time 05/19/2018  arrival time 2:30 for 3:00 appointment with Dr. Erskin Burnet. At  [facility]

## 2018-05-19 ENCOUNTER — Ambulatory Visit (HOSPITAL_BASED_OUTPATIENT_CLINIC_OR_DEPARTMENT_OTHER): Payer: Medicare Other | Admitting: Physical Medicine & Rehabilitation

## 2018-05-19 ENCOUNTER — Encounter: Payer: Medicare Other | Attending: Physical Medicine & Rehabilitation

## 2018-05-19 ENCOUNTER — Other Ambulatory Visit: Payer: Medicare Other | Admitting: *Deleted

## 2018-05-19 ENCOUNTER — Encounter: Payer: Self-pay | Admitting: Physical Medicine & Rehabilitation

## 2018-05-19 VITALS — BP 173/55 | HR 70 | Ht 65.0 in | Wt 130.0 lb

## 2018-05-19 DIAGNOSIS — I4819 Other persistent atrial fibrillation: Secondary | ICD-10-CM | POA: Insufficient documentation

## 2018-05-19 DIAGNOSIS — I69398 Other sequelae of cerebral infarction: Secondary | ICD-10-CM

## 2018-05-19 DIAGNOSIS — I69393 Ataxia following cerebral infarction: Secondary | ICD-10-CM

## 2018-05-19 DIAGNOSIS — J849 Interstitial pulmonary disease, unspecified: Secondary | ICD-10-CM | POA: Insufficient documentation

## 2018-05-19 DIAGNOSIS — R269 Unspecified abnormalities of gait and mobility: Secondary | ICD-10-CM | POA: Insufficient documentation

## 2018-05-19 DIAGNOSIS — Z79899 Other long term (current) drug therapy: Secondary | ICD-10-CM | POA: Diagnosis not present

## 2018-05-19 NOTE — Patient Instructions (Addendum)
Subcortical or brainstem stroke  Pseudo bulbar affect - may require a medication named Nuedextra

## 2018-05-19 NOTE — Progress Notes (Signed)
Subjective:    Patient ID: Meredith Gonzalez, female    DOB: 1935/11/28, 83 y.o.   MRN: [id number] 83 year old right-handed female with history of diet-controlled diabetes mellitus, CAD, CVA in 2000, hypertension, diastolic congestive heart failure, atrial fibrillation with pacemaker, maintained on Eliquis.  Per chart  review, lives alone at [facility] in [place], [place].  Independent with assistive device.  She has a son in the area.  Presented 04/06/2018 with left-sided weakness and ataxia.  Cranial CT scan negative.  The  patient did not receive tPA.  CT perfusion scan as well as angiogram of head and neck showed moderate to severe 60% to 70% right carotid bifurcation stenosis.  Follow up renal CT scan 04/13/2018 negative.  MRI not completed due to pacemaker.   Echocardiogram with ejection fraction of 01%, normal systolic function.  Vascular surgery consulted in regard to carotid stenosis.  PLAN:  Carotid endarterectomy after recovery of CVA to follow up with outpatient.  The patient remained on Eliquis for history of atrial fibrillation, CVA prophylaxis.  Therapy evaluations completed, and the patient was admitted for a comprehensive rehab HPI Staying alone, doing laundry and cooking, trouble changing sheets OT assisted [place] once ~2days after d/c from rehab.  Fell while picking up reacher Had to wait for son.   No injury Discussed vascular recommendation for re eval once recovered from CVA Discussed appt with Neuro in April Pain Inventory Average Pain 5 Pain Right Now 5 My pain is dull and aching  In the last 24 hours, has pain interfered with the following? General activity 3 Relation with others 3 Enjoyment of life 5 What TIME of day is your pain at its worst? all Sleep (in general) Poor  Pain is worse with: other Pain improves with: heat/ice and medication Relief from Meds: 6  Mobility walk with assistance use a walker how many  minutes can you walk? 10 do you drive?  no transfers alone  Function retired I need assistance with the following:  shopping Do you have any goals in this area?  yes  Neuro/Psych bladder control problems trouble walking anxiety  Prior Studies transitional care  Physicians involved in your care transitional care   Family History  Problem Relation Age of Onset  . Congestive Heart Failure Mother   . Heart attack Father   . Alcoholism Father   . Alcoholism Sister   . Alcoholism Brother   . Alcoholism Brother   . Throat cancer Brother    Social History   Socioeconomic History  . Marital status: Widowed    Spouse name: Not on file  . Number of children: Not on file  . Years of education: Not on file  . Highest education level: Not on file  Occupational History  . Not on file  Social Needs  . Financial resource strain: Not on file  . Food insecurity:    Worry: Not on file    Inability: Not on file  . Transportation needs:    Medical: Not on file    Non-medical: Not on file  Tobacco Use  . Smoking status: Former Smoker    Packs/day: 0.50    Years: 4.00    Pack years: 2.00    Types: Cigarettes    Last attempt to quit: 08/07/1972    Years since quitting: 45.8  . Smokeless tobacco: Never Used  Substance and Sexual Activity  . Alcohol use: Yes    Frequency: Never    Comment: 02/11/2018 "  couple drinks/year; if that"  . Drug use: Never  . Sexual activity: Not on file  Lifestyle  . Physical activity:    Days per week: Not on file    Minutes per session: Not on file  . Stress: Not on file  Relationships  . Social connections:    Talks on phone: Not on file    Gets together: Not on file    Attends religious service: Not on file    Active member of club or organization: Not on file    Attends meetings of clubs or organizations: Not on file    Relationship status: Not on file  Other Topics Concern  . Not on file  Social History Narrative  . Not on file    Past Surgical History:  Procedure Laterality Date  . ABDOMINAL HYSTERECTOMY    . ANKLE FRACTURE SURGERY Right   . BASAL CELL CARCINOMA EXCISION Left    "leg"  . CARDIAC CATHETERIZATION  ~ 2014  . CARDIOVERSION N/A 02/13/2018   Procedure: CARDIOVERSION;  Surgeon: Sueanne Margarita, MD;  Location[facility] END[facility]COPY;  Service: Cardiovascular;  Laterality: N/A;  . CARDIOVERSION N/A 03/27/2018   Procedure: CARDIOVERSION;  Surgeon: Lelon Perla, MD;  Location: [facility] ENDOSCOPY;  Service: Cardiovascular;  Laterality: N/A;  . CARPAL TUNNEL RELEASE     bilateral  . CATARACT EXTRACTION W/ INTRAOCULAR LENS  IMPLANT, BILATERAL Bilateral   . CHOLECYSTECTOMY OPEN    . COLONOSCOPY    . CORONARY ANGIOPLASTY WITH STENT PLACEMENT  06/2016  . CORONARY STENT INTERVENTION N/A 07/19/2016   Procedure: Coronary Stent Intervention;  Surgeon: Peter M Martinique, MD;  Location: [facility] CV LAB;  Service: Cardiovascular;  Laterality: N/A;  . EYE SURGERY     right eye catarace/lens implant  . FEMUR FRACTURE SURGERY Bilateral   . FRACTURE SURGERY    . GASTRIC BYPASS    . INSERT / REPLACE / REMOVE PACEMAKER  ~ 2012  . LEAD REVISION N/A 01/01/2013   Procedure: LEAD REVISION;  Surgeon: Evans Lance, MD;  Location: [facility] CATH LAB;  Service: Cardiovascular;  Laterality: N/A;  . LEFT HEART CATH AND CORONARY ANGIOGRAPHY N/A 07/19/2016   Procedure: Left Heart Cath and Coronary Angiography;  Surgeon: Peter M Martinique, MD;  Location: [facility] CV LAB;  Service: Cardiovascular;  Laterality: N/A;  . OVARIAN CYST SURGERY     "one side only"  . TEE WITHOUT CARDIOVERSION N/A 02/13/2018   Procedure: TRANSESOPHAGEAL ECHOCARDIOGRAM (TEE);  Surgeon: Sueanne Margarita, MD;  Location: [facility] ENDOSCOPY;  Service: Cardiovascular;  Laterality: N/A;  . TONSILLECTOMY     Past Medical History:  Diagnosis Date  . Anemia   . Anxiety   . Arthritis    "all over" (02/11/2018)  . Basal cell carcinoma    "left leg" (02/11/2018)  . Cervical spine  fracture ([place]) 12/2017   "C1-2"  . CHF (congestive heart failure) ([place])   . Chronic bronchitis ([place])   . Chronic neck pain    "since I broke my neck 6-8 wk ago" (02/11/2018)  . Diabetic peripheral neuropathy ([place])   . Fibromyalgia   . Fracture of right humerus   . Generalized weakness   . Headache    "weekly" (02/11/2018)  . History of blood transfusion    "related to one of my femur surgeries" (02/11/2018)  . History of echocardiogram    Echo 8/18: EF 50-55, no RWMA, Gr 1 DD, calcified AV leaflets, MAC, trivial MR, mod LAE, PASP 37  .  Hypertension   . Hypothyroidism   . Incontinence of urine   . Ischemic stroke ([place]) 2000   "lost part of the vision in my right eye" (02/11/2018)  . Major depression, chronic   . Myocardial infarction ([place]) 06/2016  . Pacemaker   . Recurrent falls   . Syncope and collapse   . Type 2 diabetes, diet controlled ([facility])    BP (!) 173/55   Pulse 70   Ht _0  (1.651 m)   Wt 130 lb (59 kg)   SpO2 90%   BMI 21.63 kg/m   Opioid Risk Score:   Fall Risk Score:  `1  Depression screen PHQ 2/9  No flowsheet data found. Review of Systems  Constitutional: Negative.   HENT: Negative.   Eyes: Negative.   Respiratory: Negative.   Cardiovascular: Negative.   Gastrointestinal: Negative.   Genitourinary: Positive for difficulty urinating.  Musculoskeletal: Positive for gait problem.  Skin: Negative.   Allergic/Immunologic: Negative.   Hematological: Negative.   Psychiatric/Behavioral: The patient is nervous/anxious.   All other systems reviewed and are negative.      Objective:   Physical Exam Vitals signs and nursing note reviewed.  Constitutional:      Appearance: Normal appearance. She is normal weight.  HENT:     Head: Normocephalic and atraumatic.     Nose: Nose normal.     Mouth/Throat:     Mouth: Mucous membranes are moist.     Pharynx: Oropharynx is clear.  Neck:     Comments: Decreased cervical range of motion with flexion  extension lateral bending and rotation Cardiovascular:     Rate and Rhythm: Normal rate and regular rhythm.     Heart sounds: Normal heart sounds. No murmur.  Pulmonary:     Effort: Pulmonary effort is normal. No respiratory distress.     Breath sounds: Normal breath sounds. No stridor.  Abdominal:     General: Abdomen is flat. There is no distension.     Palpations: Abdomen is soft.  Skin:    General: Skin is warm and dry.  Neurological:     Mental Status: She is alert and oriented to person, place, and time.     Gait: Gait abnormal.  Psychiatric:        Mood and Affect: Mood normal.        Behavior: Behavior normal.     Elderly female no acute distress She has dysmetria bilaterally left greater than right on finger-nose-finger testing less so on heel-to-shin testing Romberg is positive She is able to ambulate with hand-held assistance or with a walker.  She has a widened base of support Her motor strength is 5/5 bilateral deltoid bicep tricep grip hip flexor knee extensor ankle dorsiflexor Sensation is intact No evidence of facial droop no evidence of dysarthria or aphasia.     Assessment & Plan:  1.  Patient with dysmetria and poor balance likely brainstem infarct although cerebellar infarct can cause the same. CT did not demonstrate a new infarct but fairly diffuse SVD.  MRI not performed secondary to pacemaker Has right carotid stenosis will follow-up with both neurology and vascular surgery. #2.  History of atrial fibrillation on Eliquis follow-up with cardiology 3.  Neck pain agree with heat at night Physical medicine rehab follow-up in 1 month.  May need transition to outpatient at that time although she does have some limitations due to transportation she is currently not driving.

## 2018-05-20 ENCOUNTER — Encounter: Payer: Self-pay | Admitting: Pulmonary Disease

## 2018-05-20 ENCOUNTER — Ambulatory Visit (INDEPENDENT_AMBULATORY_CARE_PROVIDER_SITE_OTHER): Payer: Medicare Other | Admitting: Pulmonary Disease

## 2018-05-20 ENCOUNTER — Other Ambulatory Visit: Payer: Medicare Other

## 2018-05-20 DIAGNOSIS — J849 Interstitial pulmonary disease, unspecified: Secondary | ICD-10-CM | POA: Diagnosis not present

## 2018-05-20 LAB — CBC
Hematocrit: 32.2 % — ABNORMAL LOW (ref 34.0–46.6)
Hemoglobin: 10.2 g/dL — ABNORMAL LOW (ref 11.1–15.9)
MCH: 28.9 pg (ref 26.6–33.0)
MCHC: 31.7 g/dL (ref 31.5–35.7)
MCV: 91 fL (ref 79–97)
Platelets: 286 10*3/uL (ref 150–450)
RBC: 3.53 x10E6/uL — ABNORMAL LOW (ref 3.77–5.28)
RDW: 16.8 % — ABNORMAL HIGH (ref 11.7–15.4)
WBC: 7 10*3/uL (ref 3.4–10.8)

## 2018-05-20 LAB — COMPREHENSIVE METABOLIC PANEL
A/G RATIO: 1.2 (ref 1.2–2.2)
ALT: 17 IU/L (ref 0–32)
AST: 18 IU/L (ref 0–40)
Albumin: 3.7 g/dL (ref 3.7–4.7)
Alkaline Phosphatase: 110 IU/L (ref 39–117)
BUN/Creatinine Ratio: 21 (ref 12–28)
BUN: 15 mg/dL (ref 8–27)
Bilirubin Total: 0.4 mg/dL (ref 0.0–1.2)
CO2: 18 mmol/L — ABNORMAL LOW (ref 20–29)
Calcium: 8.2 mg/dL — ABNORMAL LOW (ref 8.7–10.3)
Chloride: 111 mmol/L — ABNORMAL HIGH (ref 96–106)
Creatinine, Ser: 0.73 mg/dL (ref 0.57–1.00)
GFR calc Af Amer: 90 mL/min/{1.73_m2} (ref >59)
GFR calc non Af Amer: 78 mL/min/{1.73_m2} (ref >59)
Globulin, Total: 3 g/dL (ref 1.5–4.5)
Glucose: 100 mg/dL — ABNORMAL HIGH (ref 65–99)
Potassium: 4 mmol/L (ref 3.5–5.2)
Sodium: 144 mmol/L (ref 134–144)
Total Protein: 6.7 g/dL (ref 6.0–8.5)

## 2018-05-20 LAB — TSH: TSH: 24.61 u[IU]/mL — ABNORMAL HIGH (ref 0.450–4.500)

## 2018-05-20 MED ORDER — BUDESONIDE-FORMOTEROL FUMARATE 160-4.5 MCG/ACT IN AERO: 2.0000 | INHALATION_SPRAY | Freq: Two times a day (BID) | RESPIRATORY_TRACT | 0 refills | Status: DC

## 2018-05-20 MED ORDER — BUDESONIDE-FORMOTEROL FUMARATE 160-4.5 MCG/ACT IN AERO: 2.0000 | INHALATION_SPRAY | Freq: Two times a day (BID) | RESPIRATORY_TRACT | 5 refills | Status: AC

## 2018-05-20 NOTE — Patient Instructions (Signed)
We will give you an inhaler called Symbicort to help with the dyspnea and wheezing We will schedule you for high-resolution CT of the chest to evaluate interstitial lung disease, amiodarone toxicity  Follow-up in 2 to 4 weeks.

## 2018-05-20 NOTE — Progress Notes (Signed)
Meredith Gonzalez    [id number]    11/15/1935  Primary Care Physician:Smith, Cecille Rubin, MD  Referring Physician: Liliane Shi, PA-C [address] [place], [place] [address]  Chief complaint: Consult for abnormal PFTs.  HPI: 83 year old with history of persistent atrial fibrillation on amiodarone, diastolic heart failure, hypothyroidism, CVA coronary artery disease.  Sent here for evaluation of abnormal PFTs  She had PFTs ordered from cardiology for amiodarone monitoring which showed severe restriction and severe diffusion defect.  The question is if amiodarone needs to be stopped She has symptoms of dyspnea on exertion.  Mild symptoms at rest.  Occasional wheezing, cough with white mucus Denies fevers, chills  Pets: No pets Occupation: Retired Chief Technology Officer Exposures: Exposures, mold, hot tub, Jacuzzi, humidifier Smoking history: 4-pack-year smoker.  Quit in 1974.  Her son who is here for office visit tells me that she is minimizing her smoking history and has smoked much more than what she admits to Travel history: Previously lived in [place] and [place].  No significant recent travel Relevant family history: No significant family history of lung disease   Outpatient Encounter Medications as of 05/20/2018  Medication Sig  . acetaminophen (TYLENOL) 500 MG tablet Take 500 mg by mouth every 6 (six) hours as needed for moderate pain.   Marland Kitchen albuterol (PROVENTIL HFA;VENTOLIN HFA) 108 (90 Base) MCG/ACT inhaler Inhale 2 puffs into the lungs every 6 (six) hours as needed for wheezing or shortness of breath.  Marland Kitchen amiodarone (PACERONE) 200 MG tablet Take 1 tablet (200 mg total) by mouth every morning.  Marland Kitchen apixaban (ELIQUIS) 2.5 MG TABS tablet Take 1 tablet (2.5 mg total) by mouth 2 (two) times daily.  Marland Kitchen atorvastatin (LIPITOR) 40 MG tablet Take 1 tablet (40 mg total) by mouth daily.  . Calcium Citrate-Vitamin D3 315-250 MG-UNIT TABS Take 1 tablet by mouth 2 (two)  times daily.  . ferrous sulfate 325 (65 FE) MG tablet Take 1 tablet (325 mg total) by mouth 2 (two) times daily with a meal.  . flintstones complete (FLINTSTONES) 60 MG chewable tablet Chew 1 tablet by mouth daily.  . fluticasone (FLONASE) 50 MCG/ACT nasal spray Place 1 spray into both nostrils daily as needed (allergies.).   Marland Kitchen furosemide (LASIX) 20 MG tablet Take 0.5 tablets (10 mg total) by mouth daily.  Marland Kitchen gabapentin (NEURONTIN) 300 MG capsule Take 1-2 capsules (300-600 mg total) by mouth See admin instructions. Take 1 capsule (300 mg) by mouth in the mornning & take 2 capsules (600 mg) by mouth at night  . levothyroxine (SYNTHROID, LEVOTHROID) 50 MCG tablet Take 1 tablet (50 mcg total) by mouth daily at 6 (six) AM for 30 days.  . methocarbamol (ROBAXIN) 500 MG tablet Take 0.5 tablets (250 mg total) by mouth every 8 (eight) hours as needed for muscle spasms.  . Multiple Vitamins-Minerals (MULTIVITAMIN ADULT) TABS Take 1 tablet by mouth daily.  . traZODone (DESYREL) 50 MG tablet Take 0.5 tablets (25 mg total) by mouth at bedtime.  . vitamin B-12 (CYANOCOBALAMIN) 1000 MCG tablet Take 1 tablet (1,000 mcg total) by mouth daily.  . nitroGLYCERIN (NITROSTAT) 0.4 MG SL tablet Place 1 tablet (0.4 mg total) under the tongue every 5 (five) minutes as needed for chest pain.   No facility-administered encounter medications on file as of 05/20/2018.     Allergies as of 05/20/2018 - Review Complete 05/20/2018  Allergen Reaction Noted  . Valium [diazepam] Anxiety 08/07/2012  . Codeine Hives  and Itching 08/07/2012  . Darifenacin Itching 08/18/2015  . Darvon [propoxyphene hcl] Itching 08/12/2012  . Daypro [oxaprozin] Itching 08/12/2012  . Enablex [darifenacin hydrobromide er] Itching 08/12/2012  . Oxycodone Itching 08/12/2012  . Propoxyphene Itching 08/18/2015  . Risperdal [risperidone] Itching 08/12/2012  . Talwin [pentazocine] Itching 08/12/2012  . Vicodin [hydrocodone-acetaminophen] Itching 08/12/2012     Past Medical History:  Diagnosis Date  . Anemia   . Anxiety   . Arthritis    "all over" (02/11/2018)  . Basal cell carcinoma    "left leg" (02/11/2018)  . Cervical spine fracture ([facility]) 12/2017   "C1-2"  . CHF (congestive heart failure) ([facility])   . Chronic bronchitis ([facility])   . Chronic neck pain    "since I broke my neck 6-8 wk ago" (02/11/2018)  . Diabetic peripheral neuropathy ([facility])   . Fibromyalgia   . Fracture of right humerus   . Generalized weakness   . Headache    "weekly" (02/11/2018)  . History of blood transfusion    "related to one of my femur surgeries" (02/11/2018)  . History of echocardiogram    Echo 8/18: EF 50-55, no RWMA, Gr 1 DD, calcified AV leaflets, MAC, trivial MR, mod LAE, PASP 37  . Hypertension   . Hypothyroidism   . Incontinence of urine   . Ischemic stroke ([facility]) 2000   "lost part of the vision in my right eye" (02/11/2018)  . Major depression, chronic   . Myocardial infarction ([facility]) 06/2016  . Pacemaker   . Recurrent falls   . Syncope and collapse   . Type 2 diabetes, diet controlled ([facility])     Past Surgical History:  Procedure Laterality Date  . ABDOMINAL HYSTERECTOMY    . ANKLE FRACTURE SURGERY Right   . BASAL CELL CARCINOMA EXCISION Left    "leg"  . CARDIAC CATHETERIZATION  ~ 2014  . CARDIOVERSION N/A 02/13/2018   Procedure: CARDIOVERSION;  Surgeon: Sueanne Margarita, MD;  Location: [facility] ENDOSCOPY;  Service: Cardiovascular;  Laterality: N/A;  . CARDIOVERSION N/A 03/27/2018   Procedure: CARDIOVERSION;  Surgeon: Lelon Perla, MD;  Location: [facility] ENDOSCOPY;  Service: Cardiovascular;  Laterality: N/A;  . CARPAL TUNNEL RELEASE     bilateral  . CATARACT EXTRACTION W/ INTRAOCULAR LENS  IMPLANT, BILATERAL Bilateral   . CHOLECYSTECTOMY OPEN    . COLONOSCOPY    . CORONARY ANGIOPLASTY WITH STENT PLACEMENT  06/2016  . CORONARY STENT INTERVENTION N/A 07/19/2016   Procedure: Coronary Stent Intervention;  Surgeon: Peter M Martinique, MD;  Location: [facility] CV LAB;  Service: Cardiovascular;  Laterality: N/A;  . EYE SURGERY     right eye catarace/lens implant  . FEMUR FRACTURE SURGERY Bilateral   . FRACTURE SURGERY    . GASTRIC BYPASS    . INSERT / REPLACE / REMOVE PACEMAKER  ~ 2012  . LEAD REVISION N/A 01/01/2013   Procedure: LEAD REVISION;  Surgeon: Evans Lance, MD;  Location: [facility] CATH LAB;  Service: Cardiovascular;  Laterality: N/A;  . LEFT HEART CATH AND CORONARY ANGIOGRAPHY N/A 07/19/2016   Procedure: Left Heart Cath and Coronary Angiography;  Surgeon: Peter M Martinique, MD;  Location: [facility] CV LAB;  Service: Cardiovascular;  Laterality: N/A;  . OVARIAN CYST SURGERY     "one side only"  . TEE WITHOUT CARDIOVERSION N/A 02/13/2018   Procedure: TRANSESOPHAGEAL ECHOCARDIOGRAM (TEE);  Surgeon: Sueanne Margarita, MD;  Location: [facility];  Service: Cardiovascular;  Laterality: N/A;  . TONSILLECTOMY  Family History  Problem Relation Age of Onset  . Congestive Heart Failure Mother   . Heart attack Father   . Alcoholism Father   . Alcoholism Sister   . Alcoholism Brother   . Alcoholism Brother   . Throat cancer Brother     Social History   Socioeconomic History  . Marital status: Widowed    Spouse name: Not on file  . Number of children: Not on file  . Years of education: Not on file  . Highest education level: Not on file  Occupational History  . Not on file  Social Needs  . Financial resource strain: Not on file  . Food insecurity:    Worry: Not on file    Inability: Not on file  . Transportation needs:    Medical: Not on file    Non-medical: Not on file  Tobacco Use  . Smoking status: Former Smoker    Packs/day: 0.50    Years: 4.00    Pack years: 2.00    Types: Cigarettes    Last attempt to quit: 08/07/1972    Years since quitting: 45.8  . Smokeless tobacco: Never Used  Substance and Sexual Activity  . Alcohol use: Yes    Frequency: Never    Comment: 02/11/2018 "couple drinks/year; if that"  .  Drug use: Never  . Sexual activity: Not on file  Lifestyle  . Physical activity:    Days per week: Not on file    Minutes per session: Not on file  . Stress: Not on file  Relationships  . Social connections:    Talks on phone: Not on file    Gets together: Not on file    Attends religious service: Not on file    Active member of club or organization: Not on file    Attends meetings of clubs or organizations: Not on file    Relationship status: Not on file  . Intimate partner violence:    Fear of current or ex partner: Not on file    Emotionally abused: Not on file    Physically abused: Not on file    Forced sexual activity: Not on file  Other Topics Concern  . Not on file  Social History Narrative  . Not on file    Review of systems: Review of Systems  Constitutional: Negative for fever and chills.  HENT: Negative.   Eyes: Negative for blurred vision.  Respiratory: as per HPI  Cardiovascular: Negative for chest pain and palpitations.  Gastrointestinal: Negative for vomiting, diarrhea, blood per rectum. Genitourinary: Negative for dysuria, urgency, frequency and hematuria.  Musculoskeletal: Negative for myalgias, back pain and joint pain.  Skin: Negative for itching and rash.  Neurological: Negative for dizziness, tremors, focal weakness, seizures and loss of consciousness.  Endo/Heme/Allergies: Negative for environmental allergies.  Psychiatric/Behavioral: Negative for depression, suicidal ideas and hallucinations.  All other systems reviewed and are negative.  Physical Exam: Blood pressure (!) 148/76, pulse 74, height _0  (1.651 m), weight 132 lb 6.4 oz (60.1 kg), SpO2 97 %. Gen:      No acute distress HEENT:  EOMI, sclera anicteric Neck:     No masses; no thyromegaly Lungs:    Clear to auscultation bilaterally; normal respiratory effor CV:         Regular rate and rhythm; no murmurs Abd:      + bowel sounds; soft, non-tender; no palpable masses, no  distension Ext:    No edema; adequate peripheral perfusion Skin:  Warm and dry; no rash Neuro: alert and oriented x 3 Psych: normal mood and affect  Data Reviewed: Imaging: Chest x-ray 02/11/2018- cardiomegaly, chronic interstitial prominence.  PFTs: 04/16/2018 FVC 1.08 [39%], FEV1 0.95 [46%], F/F 88, TLC 3.33 [64%], DLCO 13.22 [51%] Severe restriction with moderate-severe diffusion defect. Test limited by patient's cough.  Labs: CBC 05/04/2018-WBC 5.6, eos 3%, absolute eosinophil count 168  Assessment:  Abnormal PFTs There is restriction and moderate-severe diffusion defect.  Although the test is noted by patient's cough. Order high-resolution CT for evaluation of interstitial lung disease and possible amiodarone toxicity  Suspect she has underlying COPD, emphysema which is not captured by PFTs I will start her on Symbicort inhaler to help with symptoms of dyspnea and wheezing.  Plan/Recommendations: - High-resolution CT - Start Symbicort  Marshell Garfinkel MD  Pulmonary and Critical Care 05/20/2018, 11:45 AM  CC: Richardson Dopp T, PA-C

## 2018-05-21 ENCOUNTER — Telehealth: Payer: Self-pay

## 2018-05-21 DIAGNOSIS — E039 Hypothyroidism, unspecified: Secondary | ICD-10-CM

## 2018-05-21 NOTE — Telephone Encounter (Signed)
Reviewed results with pt who verbalized understanding. Per Nicki Reaper to have pt obtain a repeat TSH in 3 weeks. Lab appt is scheduled for 3/19.  Pt states that she will be following up with PCP on 3/5. Orders have been placed in Epic and a copy has been sent to pt's PCP.  Pt also states that her device wasn't transmitting information and wanted to know how she could get it fixed. I spoke with Pamala Hurry in device clinc and she states that pt's device is updating now and her device is good to go. Pt thanked me for the call.

## 2018-05-22 ENCOUNTER — Telehealth: Payer: Self-pay | Admitting: Pulmonary Disease

## 2018-05-22 NOTE — Telephone Encounter (Signed)
Meredith Gonzalez CT has been scheduled on 06/04/18 @ 10:00am at [facility] and she is aware of this appt

## 2018-05-22 NOTE — Telephone Encounter (Signed)
I am working on getting this CT scheduled for the patient

## 2018-05-22 NOTE — Telephone Encounter (Signed)
Please call patient to discuss her symptoms. Based upon her report, she can take an increased dose of Lasix for 3 days.  Her chart indicates she takes Lasix 10 mg QD. Increase Lasix to 20 mg QD x 2 days, then resume 10 mg QD. Call if symptoms do not improve. Richardson Dopp, PA-C    05/22/2018 2:26 PM

## 2018-05-22 NOTE — Telephone Encounter (Signed)
Spoke with the patient, she stated that her weight has been going up and she had been noticing more swelling in her ankles and feet. She expressed understanding about Richardson Dopp PA-C's recommendations. I advised her to contact us on Monday to let us know if she feels better. She had no further questions.

## 2018-05-30 ENCOUNTER — Encounter (HOSPITAL_BASED_OUTPATIENT_CLINIC_OR_DEPARTMENT_OTHER): Payer: Self-pay | Admitting: Adult Health

## 2018-05-30 ENCOUNTER — Inpatient Hospital Stay (HOSPITAL_BASED_OUTPATIENT_CLINIC_OR_DEPARTMENT_OTHER)
Admission: EM | Admit: 2018-05-30 | Discharge: 2018-06-05 | DRG: 038 | Disposition: A | Discharge status: Rehabilitation Facility | Payer: Medicare Other | Attending: Internal Medicine | Admitting: Internal Medicine

## 2018-05-30 ENCOUNTER — Emergency Department (HOSPITAL_BASED_OUTPATIENT_CLINIC_OR_DEPARTMENT_OTHER): Payer: Medicare Other

## 2018-05-30 ENCOUNTER — Other Ambulatory Visit: Payer: Self-pay

## 2018-05-30 ENCOUNTER — Inpatient Hospital Stay: Admit: 2018-05-30 | Payer: Self-pay | Admitting: Internal Medicine

## 2018-05-30 DIAGNOSIS — F419 Anxiety disorder, unspecified: Secondary | ICD-10-CM | POA: Diagnosis present

## 2018-05-30 DIAGNOSIS — S300XXA Contusion of lower back and pelvis, initial encounter: Secondary | ICD-10-CM | POA: Diagnosis present

## 2018-05-30 DIAGNOSIS — I4819 Other persistent atrial fibrillation: Secondary | ICD-10-CM | POA: Diagnosis present

## 2018-05-30 DIAGNOSIS — I639 Cerebral infarction, unspecified: Secondary | ICD-10-CM | POA: Diagnosis not present

## 2018-05-30 DIAGNOSIS — E039 Hypothyroidism, unspecified: Secondary | ICD-10-CM | POA: Diagnosis not present

## 2018-05-30 DIAGNOSIS — Z9842 Cataract extraction status, left eye: Secondary | ICD-10-CM

## 2018-05-30 DIAGNOSIS — R402142 Coma scale, eyes open, spontaneous, at arrival to emergency department: Secondary | ICD-10-CM | POA: Diagnosis present

## 2018-05-30 DIAGNOSIS — R402362 Coma scale, best motor response, obeys commands, at arrival to emergency department: Secondary | ICD-10-CM | POA: Diagnosis present

## 2018-05-30 DIAGNOSIS — Z961 Presence of intraocular lens: Secondary | ICD-10-CM | POA: Diagnosis present

## 2018-05-30 DIAGNOSIS — R296 Repeated falls: Secondary | ICD-10-CM | POA: Diagnosis not present

## 2018-05-30 DIAGNOSIS — I6521 Occlusion and stenosis of right carotid artery: Secondary | ICD-10-CM | POA: Diagnosis not present

## 2018-05-30 DIAGNOSIS — G8929 Other chronic pain: Secondary | ICD-10-CM | POA: Diagnosis present

## 2018-05-30 DIAGNOSIS — I10 Essential (primary) hypertension: Secondary | ICD-10-CM | POA: Diagnosis not present

## 2018-05-30 DIAGNOSIS — I2510 Atherosclerotic heart disease of native coronary artery without angina pectoris: Secondary | ICD-10-CM | POA: Diagnosis not present

## 2018-05-30 DIAGNOSIS — R29708 NIHSS score 8: Secondary | ICD-10-CM | POA: Diagnosis present

## 2018-05-30 DIAGNOSIS — R2990 Unspecified symptoms and signs involving the nervous system: Secondary | ICD-10-CM

## 2018-05-30 DIAGNOSIS — R0600 Dyspnea, unspecified: Secondary | ICD-10-CM

## 2018-05-30 DIAGNOSIS — W19XXXA Unspecified fall, initial encounter: Secondary | ICD-10-CM | POA: Diagnosis present

## 2018-05-30 DIAGNOSIS — D649 Anemia, unspecified: Secondary | ICD-10-CM | POA: Diagnosis present

## 2018-05-30 DIAGNOSIS — M1990 Unspecified osteoarthritis, unspecified site: Secondary | ICD-10-CM | POA: Diagnosis not present

## 2018-05-30 DIAGNOSIS — E1136 Type 2 diabetes mellitus with diabetic cataract: Secondary | ICD-10-CM | POA: Diagnosis not present

## 2018-05-30 DIAGNOSIS — I69354 Hemiplegia and hemiparesis following cerebral infarction affecting left non-dominant side: Secondary | ICD-10-CM

## 2018-05-30 DIAGNOSIS — R4701 Aphasia: Secondary | ICD-10-CM | POA: Diagnosis present

## 2018-05-30 DIAGNOSIS — Z950 Presence of cardiac pacemaker: Secondary | ICD-10-CM | POA: Diagnosis present

## 2018-05-30 DIAGNOSIS — F329 Major depressive disorder, single episode, unspecified: Secondary | ICD-10-CM | POA: Diagnosis present

## 2018-05-30 DIAGNOSIS — I5032 Chronic diastolic (congestive) heart failure: Secondary | ICD-10-CM | POA: Diagnosis not present

## 2018-05-30 DIAGNOSIS — I110 Hypertensive heart disease with heart failure: Secondary | ICD-10-CM | POA: Diagnosis not present

## 2018-05-30 DIAGNOSIS — Z87891 Personal history of nicotine dependence: Secondary | ICD-10-CM

## 2018-05-30 DIAGNOSIS — Z85828 Personal history of other malignant neoplasm of skin: Secondary | ICD-10-CM

## 2018-05-30 DIAGNOSIS — Z79899 Other long term (current) drug therapy: Secondary | ICD-10-CM

## 2018-05-30 DIAGNOSIS — Z79890 Hormone replacement therapy (postmenopausal): Secondary | ICD-10-CM

## 2018-05-30 DIAGNOSIS — E785 Hyperlipidemia, unspecified: Secondary | ICD-10-CM | POA: Diagnosis not present

## 2018-05-30 DIAGNOSIS — E1142 Type 2 diabetes mellitus with diabetic polyneuropathy: Secondary | ICD-10-CM | POA: Diagnosis not present

## 2018-05-30 DIAGNOSIS — D62 Acute posthemorrhagic anemia: Secondary | ICD-10-CM | POA: Diagnosis not present

## 2018-05-30 DIAGNOSIS — Z66 Do not resuscitate: Secondary | ICD-10-CM | POA: Diagnosis present

## 2018-05-30 DIAGNOSIS — E119 Type 2 diabetes mellitus without complications: Secondary | ICD-10-CM

## 2018-05-30 DIAGNOSIS — Z888 Allergy status to other drugs, medicaments and biological substances status: Secondary | ICD-10-CM

## 2018-05-30 DIAGNOSIS — Z9841 Cataract extraction status, right eye: Secondary | ICD-10-CM

## 2018-05-30 DIAGNOSIS — I63332 Cerebral infarction due to thrombosis of left posterior cerebral artery: Secondary | ICD-10-CM

## 2018-05-30 DIAGNOSIS — Z7901 Long term (current) use of anticoagulants: Secondary | ICD-10-CM

## 2018-05-30 DIAGNOSIS — M797 Fibromyalgia: Secondary | ICD-10-CM | POA: Diagnosis present

## 2018-05-30 DIAGNOSIS — E876 Hypokalemia: Secondary | ICD-10-CM | POA: Diagnosis not present

## 2018-05-30 DIAGNOSIS — Z885 Allergy status to narcotic agent status: Secondary | ICD-10-CM

## 2018-05-30 DIAGNOSIS — R471 Dysarthria and anarthria: Secondary | ICD-10-CM | POA: Diagnosis present

## 2018-05-30 DIAGNOSIS — Z8249 Family history of ischemic heart disease and other diseases of the circulatory system: Secondary | ICD-10-CM

## 2018-05-30 DIAGNOSIS — I252 Old myocardial infarction: Secondary | ICD-10-CM

## 2018-05-30 DIAGNOSIS — M4802 Spinal stenosis, cervical region: Secondary | ICD-10-CM | POA: Diagnosis present

## 2018-05-30 DIAGNOSIS — Z955 Presence of coronary angioplasty implant and graft: Secondary | ICD-10-CM

## 2018-05-30 DIAGNOSIS — R402252 Coma scale, best verbal response, oriented, at arrival to emergency department: Secondary | ICD-10-CM | POA: Diagnosis present

## 2018-05-30 DIAGNOSIS — E1140 Type 2 diabetes mellitus with diabetic neuropathy, unspecified: Secondary | ICD-10-CM | POA: Diagnosis not present

## 2018-05-30 LAB — DIFFERENTIAL
Abs Immature Granulocytes: 0.05 10*3/uL (ref 0.00–0.07)
Basophils Absolute: 0 10*3/uL (ref 0.0–0.1)
Basophils Relative: 0 %
Eosinophils Absolute: 0.1 10*3/uL (ref 0.0–0.5)
Eosinophils Relative: 1 %
Immature Granulocytes: 1 %
Lymphocytes Relative: 8 %
Lymphs Abs: 0.8 10*3/uL (ref 0.7–4.0)
MONO ABS: 1 10*3/uL (ref 0.1–1.0)
Monocytes Relative: 9 %
Neutro Abs: 8.8 10*3/uL — ABNORMAL HIGH (ref 1.7–7.7)
Neutrophils Relative %: 81 %

## 2018-05-30 LAB — COMPREHENSIVE METABOLIC PANEL
ALT: 18 U/L (ref 0–44)
AST: 19 U/L (ref 15–41)
Albumin: 3.4 g/dL — ABNORMAL LOW (ref 3.5–5.0)
Alkaline Phosphatase: 102 U/L (ref 38–126)
Anion gap: 7 (ref 5–15)
BUN: 29 mg/dL — ABNORMAL HIGH (ref 8–23)
CO2: 22 mmol/L (ref 22–32)
CREATININE: 1.19 mg/dL — AB (ref 0.44–1.00)
Calcium: 8.2 mg/dL — ABNORMAL LOW (ref 8.9–10.3)
Chloride: 108 mmol/L (ref 98–111)
GFR calc Af Amer: 50 mL/min — ABNORMAL LOW (ref >60)
GFR, EST NON AFRICAN AMERICAN: 43 mL/min — AB (ref >60)
Glucose, Bld: 104 mg/dL — ABNORMAL HIGH (ref 70–99)
Potassium: 3.6 mmol/L (ref 3.5–5.1)
Sodium: 137 mmol/L (ref 135–145)
Total Bilirubin: 0.7 mg/dL (ref 0.3–1.2)
Total Protein: 7.3 g/dL (ref 6.5–8.1)

## 2018-05-30 LAB — CBC
HCT: 35.1 % — ABNORMAL LOW (ref 36.0–46.0)
Hemoglobin: 10.2 g/dL — ABNORMAL LOW (ref 12.0–15.0)
MCH: 29.2 pg (ref 26.0–34.0)
MCHC: 29.1 g/dL — AB (ref 30.0–36.0)
MCV: 100.6 fL — ABNORMAL HIGH (ref 80.0–100.0)
Platelets: 234 10*3/uL (ref 150–400)
RBC: 3.49 MIL/uL — ABNORMAL LOW (ref 3.87–5.11)
RDW: 20.6 % — ABNORMAL HIGH (ref 11.5–15.5)
WBC: 10.7 10*3/uL — ABNORMAL HIGH (ref 4.0–10.5)
nRBC: 0 % (ref 0.0–0.2)

## 2018-05-30 LAB — CBG MONITORING, ED: Glucose-Capillary: 87 mg/dL (ref 70–99)

## 2018-05-30 LAB — APTT: aPTT: 40 seconds — ABNORMAL HIGH (ref 24–36)

## 2018-05-30 LAB — PROTIME-INR
INR: 1.1 (ref 0.8–1.2)
Prothrombin Time: 13.6 seconds (ref 11.4–15.2)

## 2018-05-30 MED ORDER — SODIUM CHLORIDE 0.9 % IV SOLN
INTRAVENOUS | Status: DC
  Administered 2018-05-30: 22:00:00 via INTRAVENOUS

## 2018-05-30 MED ORDER — CALCIUM CARBONATE-VITAMIN D 500-200 MG-UNIT PO TABS
1.0000 | ORAL_TABLET | Freq: Two times a day (BID) | ORAL | Status: DC
  Administered 2018-05-31 – 2018-06-05 (×8): 1 via ORAL
  Filled 2018-05-30 (×10): qty 1

## 2018-05-30 MED ORDER — ACETAMINOPHEN 325 MG PO TABS
650.0000 mg | ORAL_TABLET | ORAL | Status: DC | PRN
  Administered 2018-05-30 – 2018-06-05 (×18): 650 mg via ORAL
  Filled 2018-05-30 (×19): qty 2

## 2018-05-30 MED ORDER — VITAMIN B-12 1000 MCG PO TABS
1000.0000 ug | ORAL_TABLET | Freq: Every day | ORAL | Status: DC
  Administered 2018-05-31 – 2018-06-05 (×5): 1000 ug via ORAL
  Filled 2018-05-30 (×5): qty 1

## 2018-05-30 MED ORDER — TRAZODONE HCL 50 MG PO TABS
25.0000 mg | ORAL_TABLET | Freq: Every day | ORAL | Status: DC
  Administered 2018-06-04: 25 mg via ORAL
  Filled 2018-05-30 (×5): qty 1

## 2018-05-30 MED ORDER — ACETAMINOPHEN 500 MG PO TABS: 500.0000 mg | ORAL_TABLET | Freq: Four times a day (QID) | ORAL | Status: DC | PRN

## 2018-05-30 MED ORDER — FLUTICASONE PROPIONATE 50 MCG/ACT NA SUSP
1.0000 | Freq: Every day | NASAL | Status: DC | PRN
  Filled 2018-05-30: qty 16

## 2018-05-30 MED ORDER — MOMETASONE FURO-FORMOTEROL FUM 200-5 MCG/ACT IN AERO
2.0000 | INHALATION_SPRAY | Freq: Two times a day (BID) | RESPIRATORY_TRACT | Status: DC
  Administered 2018-05-31 – 2018-06-05 (×10): 2 via RESPIRATORY_TRACT
  Filled 2018-05-30 (×2): qty 8.8

## 2018-05-30 MED ORDER — LEVOTHYROXINE SODIUM 50 MCG PO TABS
50.0000 ug | ORAL_TABLET | Freq: Every day | ORAL | Status: DC
  Administered 2018-05-31 – 2018-06-05 (×6): 50 ug via ORAL
  Filled 2018-05-30 (×6): qty 1

## 2018-05-30 MED ORDER — ADULT MULTIVITAMIN W/MINERALS CH
1.0000 | ORAL_TABLET | Freq: Every day | ORAL | Status: DC
  Administered 2018-05-31 – 2018-06-05 (×5): 1 via ORAL
  Filled 2018-05-30 (×6): qty 1

## 2018-05-30 MED ORDER — SODIUM CHLORIDE 0.9% FLUSH
3.0000 mL | Freq: Once | INTRAVENOUS | Status: DC
  Filled 2018-05-30: qty 3

## 2018-05-30 MED ORDER — GABAPENTIN 300 MG PO CAPS
600.0000 mg | ORAL_CAPSULE | Freq: Every evening | ORAL | Status: DC
  Administered 2018-05-30 – 2018-06-04 (×6): 600 mg via ORAL
  Filled 2018-05-30 (×7): qty 2

## 2018-05-30 MED ORDER — NITROGLYCERIN 0.4 MG SL SUBL: 0.4000 mg | SUBLINGUAL_TABLET | SUBLINGUAL | Status: DC | PRN

## 2018-05-30 MED ORDER — AMIODARONE HCL 200 MG PO TABS
200.0000 mg | ORAL_TABLET | ORAL | Status: DC
  Administered 2018-05-31 – 2018-06-05 (×6): 200 mg via ORAL
  Filled 2018-05-30 (×6): qty 1

## 2018-05-30 MED ORDER — FUROSEMIDE 20 MG PO TABS
10.0000 mg | ORAL_TABLET | Freq: Every day | ORAL | Status: DC
  Administered 2018-05-31 – 2018-06-05 (×5): 10 mg via ORAL
  Filled 2018-05-30 (×5): qty 1

## 2018-05-30 MED ORDER — IOPAMIDOL (ISOVUE-370) INJECTION 76%
100.0000 mL | Freq: Once | INTRAVENOUS | Status: AC | PRN
  Administered 2018-05-30: 80 mL via INTRAVENOUS

## 2018-05-30 MED ORDER — ACETAMINOPHEN 650 MG RE SUPP: 650.0000 mg | RECTAL | Status: DC | PRN

## 2018-05-30 MED ORDER — SENNOSIDES-DOCUSATE SODIUM 8.6-50 MG PO TABS: 1.0000 | ORAL_TABLET | Freq: Every evening | ORAL | Status: DC | PRN

## 2018-05-30 MED ORDER — ATORVASTATIN CALCIUM 40 MG PO TABS
40.0000 mg | ORAL_TABLET | Freq: Every day | ORAL | Status: DC
  Administered 2018-05-30 – 2018-05-31 (×2): 40 mg via ORAL
  Filled 2018-05-30 (×3): qty 1

## 2018-05-30 MED ORDER — MOMETASONE FURO-FORMOTEROL FUM 200-5 MCG/ACT IN AERO: 2.0000 | INHALATION_SPRAY | Freq: Two times a day (BID) | RESPIRATORY_TRACT | Status: DC

## 2018-05-30 MED ORDER — FERROUS SULFATE 325 (65 FE) MG PO TABS
325.0000 mg | ORAL_TABLET | Freq: Two times a day (BID) | ORAL | Status: DC
  Administered 2018-05-31 – 2018-06-05 (×9): 325 mg via ORAL
  Filled 2018-05-30 (×10): qty 1

## 2018-05-30 MED ORDER — CALCIUM CITRATE-VITAMIN D3 315-250 MG-UNIT PO TABS: 1.0000 | ORAL_TABLET | Freq: Two times a day (BID) | ORAL | Status: DC

## 2018-05-30 MED ORDER — FLINTSTONES COMPLETE 60 MG PO CHEW: 1.0000 | CHEWABLE_TABLET | Freq: Every day | ORAL | Status: DC

## 2018-05-30 MED ORDER — METHOCARBAMOL 500 MG PO TABS: 250.0000 mg | ORAL_TABLET | Freq: Three times a day (TID) | ORAL | Status: DC | PRN

## 2018-05-30 MED ORDER — APIXABAN 2.5 MG PO TABS
2.5000 mg | ORAL_TABLET | Freq: Two times a day (BID) | ORAL | Status: DC
  Administered 2018-05-30 – 2018-05-31 (×2): 2.5 mg via ORAL
  Filled 2018-05-30 (×2): qty 1

## 2018-05-30 MED ORDER — GABAPENTIN 300 MG PO CAPS
300.0000 mg | ORAL_CAPSULE | Freq: Every morning | ORAL | Status: DC
  Administered 2018-05-31 – 2018-06-05 (×5): 300 mg via ORAL
  Filled 2018-05-30 (×5): qty 1

## 2018-05-30 MED ORDER — ACETAMINOPHEN 160 MG/5ML PO SOLN: 650.0000 mg | ORAL | Status: DC | PRN

## 2018-05-30 MED ORDER — CALCIUM CITRATE-VITAMIN D 500-500 MG-UNIT PO CHEW
1.0000 | CHEWABLE_TABLET | Freq: Two times a day (BID) | ORAL | Status: DC
  Filled 2018-05-30: qty 1

## 2018-05-30 MED ORDER — ALBUTEROL SULFATE (2.5 MG/3ML) 0.083% IN NEBU: 2.5000 mg | INHALATION_SOLUTION | Freq: Four times a day (QID) | RESPIRATORY_TRACT | Status: DC | PRN

## 2018-05-30 MED ORDER — STROKE: EARLY STAGES OF RECOVERY BOOK
Freq: Once | Status: AC
  Administered 2018-05-30: 21:00:00

## 2018-05-30 NOTE — ED Provider Notes (Signed)
[facility] Provider Note   CSN: [id number] Arrival date & time: 05/30/18  1402    History   Chief Complaint Chief Complaint  Patient presents with  . Aphasia    HPI Meredith Gonzalez is a 83 y.o. female.     HPI Patient is an 83 year old female who was last seen well last night.  She woke this morning with slurred speech and left-sided weakness.  She did not alert her son for 6 to 7 hours.  She eventually was convinced to come to the ER.  Recent hospitalization and discharge in mid February for an acute stroke.  She is compliant with her medications.  She reports some difficulty with her speech and some mild left arm and left leg weakness.  Son reports that her speech is abnormal.  She is on Eliquis for paroxysmal atrial fibrillation.  No chest pain or shortness of breath.  No fevers or chills.  Symptoms are mild to moderate in severity   Past Medical History:  Diagnosis Date  . Anemia   . Anxiety   . Arthritis    "all over" (02/11/2018)  . Basal cell carcinoma    "left leg" (02/11/2018)  . Cervical spine fracture ([place]) 12/2017   "C1-2"  . CHF (congestive heart failure) ([place])   . Chronic bronchitis ([place])   . Chronic neck pain    "since I broke my neck 6-8 wk ago" (02/11/2018)  . Diabetic peripheral neuropathy ([place])   . Fibromyalgia   . Fracture of right humerus   . Generalized weakness   . Headache    "weekly" (02/11/2018)  . History of blood transfusion    "related to one of my femur surgeries" (02/11/2018)  . History of echocardiogram    Echo 8/18: EF 50-55, no RWMA, Gr 1 DD, calcified AV leaflets, MAC, trivial MR, mod LAE, PASP 37  . Hypertension   . Hypothyroidism   . Incontinence of urine   . Ischemic stroke ([place]) 2000   "lost part of the vision in my right eye" (02/11/2018)  . Major depression, chronic   . Myocardial infarction ([place]) 06/2016  . Pacemaker   . Recurrent falls   . Syncope and collapse   . Type 2 diabetes, diet  controlled [facility])     Patient Active Problem List   Diagnosis Date Noted  . Stroke ([place]) 05/30/2018  . Chronic diastolic congestive heart failure ([place])   . Anemia of chronic disease   . Hypoalbuminemia due to protein-calorie malnutrition ([place])   . Labile blood pressure   . Atrial fibrillation ([place])   . Chronic neck pain   . Subcortical infarction ([place]) 04/27/2018  . CVA (cerebral vascular accident) ([place]) 04/26/2018  . TIA (transient ischemic attack) 04/24/2018  . CAD (coronary artery disease) 08/07/2016  . (HFpEF) heart failure with preserved ejection fraction ([place]) 08/07/2016  . Ischemic cardiomyopathy 08/07/2016  . Chest pain   . NSTEMI (non-ST elevated myocardial infarction) ([place])   . Status post coronary artery stent placement   . Abnormal stress test   . SOB (shortness of breath)   . Multifocal pneumonia 07/13/2016  . Elevated troponin 07/13/2016  . Leukocytosis   . Pacemaker 05/17/2014  . Complications, pacemaker cardiac, mechanical 12/31/2012  . Edema extremities 10/08/2012  . Persistent atrial fibrillation 10/08/2012  . Hypertension 10/08/2012  . Hypothyroidism 10/08/2012  . Neuropathy 10/08/2012  . Diabetes ([place]) 10/08/2012  . Depression 09/08/2012  . Anemia 08/08/2012  . Knee fracture, left 08/07/2012  .  Recurrent falls 08/07/2012  . Syncope 08/07/2012  . SDH (subdural hematoma) ([place]) 08/07/2012    Past Surgical History:  Procedure Laterality Date  . ABDOMINAL HYSTERECTOMY    . ANKLE FRACTURE SURGERY Right   . BASAL CELL CARCINOMA EXCISION Left    "leg"  . CARDIAC CATHETERIZATION  ~ 2014  . CARDIOVERSION N/A 02/13/2018   Procedure: CARDIOVERSION;  Surgeon: Sueanne Margarita, MD;  Location: [facility] ENDOSCOPY;  Service: Cardiovascular;  Laterality: N/A;  . CARDIOVERSION N/A 03/27/2018   Procedure: CARDIOVERSION;  Surgeon: Lelon Perla, MD;  Location: [facility] ENDOSCOPY;  Service: Cardiovascular;  Laterality: N/A;  . CARPAL TUNNEL RELEASE     bilateral  . CATARACT  EXTRACTION W/ INTRAOCULAR LENS  IMPLANT, BILATERAL Bilateral   . CHOLECYSTECTOMY OPEN    . COLONOSCOPY    . CORONARY ANGIOPLASTY WITH STENT PLACEMENT  06/2016  . CORONARY STENT INTERVENTION N/A 07/19/2016   Procedure: Coronary Stent Intervention;  Surgeon: Peter M Martinique, MD;  Location: [facility] CV LAB;  Service: Cardiovascular;  Laterality: N/A;  . EYE SURGERY     right eye catarace/lens implant  . FEMUR FRACTURE SURGERY Bilateral   . FRACTURE SURGERY    . GASTRIC BYPASS    . INSERT / REPLACE / REMOVE PACEMAKER  ~ 2012  . LEAD REVISION N/A 01/01/2013   Procedure: LEAD REVISION;  Surgeon: Evans Lance, MD;  Location: [facility] CATH LAB;  Service: Cardiovascular;  Laterality: N/A;  . LEFT HEART CATH AND CORONARY ANGIOGRAPHY N/A 07/19/2016   Procedure: Left Heart Cath and Coronary Angiography;  Surgeon: Peter M Martinique, MD;  Location: [place] [facility] CV LAB;  Service: Cardiovascular;  Laterality: N/A;  . OVARIAN CYST SURGERY     "one side only"  . TEE WITHOUT CARDIOVERSION N/A 02/13/2018   Procedure: TRANSESOPHAGEAL ECHOCARDIOGRAM (TEE);  Surgeon: Sueanne Margarita, MD;  Location: [facility] ENDOSCOPY;  Service: Cardiovascular;  Laterality: N/A;  . TONSILLECTOMY       OB History   No obstetric history on file.      Home Medications    Prior to Admission medications   Medication Sig Start Date End Date Taking? Authorizing Provider  acetaminophen (TYLENOL) 500 MG tablet Take 500 mg by mouth every 6 (six) hours as needed for moderate pain.     [provider]  albuterol (PROVENTIL HFA;VENTOLIN HFA) 108 (90 Base) MCG/ACT inhaler Inhale 2 puffs into the lungs every 6 (six) hours as needed for wheezing or shortness of breath. 05/07/18   Angiulli, Lavon Paganini, PA-C  amiodarone (PACERONE) 200 MG tablet Take 1 tablet (200 mg total) by mouth every morning. 05/07/18   Angiulli, Lavon Paganini, PA-C  apixaban (ELIQUIS) 2.5 MG TABS tablet Take 1 tablet (2.5 mg total) by mouth 2 (two) times daily. 05/07/18   Angiulli,  Lavon Paganini, PA-C  atorvastatin (LIPITOR) 40 MG tablet Take 1 tablet (40 mg total) by mouth daily. 05/07/18 08/05/18  Angiulli, Lavon Paganini, PA-C  budesonide-formoterol (SYMBICORT) 160-4.5 MCG/ACT inhaler Inhale 2 puffs into the lungs 2 (two) times daily for 1 day. 05/20/18 05/21/18  Marshell Garfinkel, MD  budesonide-formoterol (SYMBICORT) 160-4.5 MCG/ACT inhaler Inhale 2 puffs into the lungs 2 (two) times daily. 05/20/18   Marshell Garfinkel, MD  Calcium Citrate-Vitamin D3 315-250 MG-UNIT TABS Take 1 tablet by mouth 2 (two) times daily.    [provider]  ferrous sulfate 325 (65 FE) MG tablet Take 1 tablet (325 mg total) by mouth 2 (two) times daily with a meal. 05/07/18   [place],  Lavon Paganini, PA-C  flintstones complete (FLINTSTONES) 60 MG chewable tablet Chew 1 tablet by mouth daily.    [provider]  fluticasone (FLONASE) 50 MCG/ACT nasal spray Place 1 spray into both nostrils daily as needed (allergies.).  12/08/17   [provider]  furosemide (LASIX) 20 MG tablet Take 0.5 tablets (10 mg total) by mouth daily. 05/07/18   Angiulli, Lavon Paganini, PA-C  gabapentin (NEURONTIN) 300 MG capsule Take 1-2 capsules (300-600 mg total) by mouth See admin instructions. Take 1 capsule (300 mg) by mouth in the mornning & take 2 capsules (600 mg) by mouth at night 05/07/18   Angiulli, Lavon Paganini, PA-C  levothyroxine (SYNTHROID, LEVOTHROID) 50 MCG tablet Take 1 tablet (50 mcg total) by mouth daily at 6 (six) AM for 30 days. 05/07/18 06/06/18  Angiulli, Lavon Paganini, PA-C  methocarbamol (ROBAXIN) 500 MG tablet Take 0.5 tablets (250 mg total) by mouth every 8 (eight) hours as needed for muscle spasms. 05/07/18   Angiulli, Lavon Paganini, PA-C  Multiple Vitamins-Minerals (MULTIVITAMIN ADULT) TABS Take 1 tablet by mouth daily.    [provider]  nitroGLYCERIN (NITROSTAT) 0.4 MG SL tablet Place 1 tablet (0.4 mg total) under the tongue every 5 (five) minutes as needed for chest pain. 10/11/16 03/17/18  Fay Records, MD    traZODone (DESYREL) 50 MG tablet Take 0.5 tablets (25 mg total) by mouth at bedtime. 05/07/18   Angiulli, Lavon Paganini, PA-C  vitamin B-12 (CYANOCOBALAMIN) 1000 MCG tablet Take 1 tablet (1,000 mcg total) by mouth daily. 07/20/16   Thurnell Lose, MD    Family History Family History  Problem Relation Age of Onset  . Congestive Heart Failure Mother   . Heart attack Father   . Alcoholism Father   . Alcoholism Sister   . Alcoholism Brother   . Alcoholism Brother   . Throat cancer Brother     Social History Social History   Tobacco Use  . Smoking status: Former Smoker    Packs/day: 0.50    Years: 4.00    Pack years: 2.00    Types: Cigarettes    Last attempt to quit: 08/07/1972    Years since quitting: 45.8  . Smokeless tobacco: Never Used  Substance Use Topics  . Alcohol use: Yes    Frequency: Never    Comment: 02/11/2018 "couple drinks/year; if that"  . Drug use: Never     Allergies   Valium [diazepam]; Codeine; Darifenacin; Darvon [propoxyphene hcl]; Daypro [oxaprozin]; Enablex [darifenacin hydrobromide er]; Oxycodone; Propoxyphene; Risperdal [risperidone]; Talwin [pentazocine]; and Vicodin [hydrocodone-acetaminophen]   Review of Systems Review of Systems  All other systems reviewed and are negative.    Physical Exam Updated Vital Signs BP (!) 153/75   Pulse (!) 54   Temp 98.2 F (36.8 C) (Oral)   Resp 20   SpO2 91%   Physical Exam Vitals signs and nursing note reviewed.  Constitutional:      General: She is not in acute distress.    Appearance: She is well-developed.  HENT:     Head: Normocephalic and atraumatic.  Eyes:     Pupils: Pupils are equal, round, and reactive to light.  Neck:     Musculoskeletal: Normal range of motion.  Cardiovascular:     Rate and Rhythm: Normal rate and regular rhythm.     Heart sounds: Normal heart sounds.  Pulmonary:     Effort: Pulmonary effort is normal.     Breath sounds: Normal breath sounds.  Abdominal:  General: There is no distension.     Palpations: Abdomen is soft.     Tenderness: There is no abdominal tenderness.  Musculoskeletal: Normal range of motion.  Skin:    General: Skin is warm and dry.  Neurological:     Mental Status: She is alert and oriented to person, place, and time.     Comments: Mild weakness of the left upper and left lower extremity as compared to the right.  Mild grip strength of the left hand.  Dysarthric speech. No facial asymetry.   Psychiatric:        Judgment: Judgment normal.      ED Treatments / Results  Labs (all labs ordered are listed, but only abnormal results are displayed) Labs Reviewed  APTT - Abnormal; Notable for the following components:      Result Value   aPTT 40 (*)    All other components within normal limits  CBC - Abnormal; Notable for the following components:   WBC 10.7 (*)    RBC 3.49 (*)    Hemoglobin 10.2 (*)    HCT 35.1 (*)    MCV 100.6 (*)    MCHC 29.1 (*)    RDW 20.6 (*)    All other components within normal limits  DIFFERENTIAL - Abnormal; Notable for the following components:   Neutro Abs 8.8 (*)    All other components within normal limits  COMPREHENSIVE METABOLIC PANEL - Abnormal; Notable for the following components:   Glucose, Bld 104 (*)    BUN 29 (*)    Creatinine, Ser 1.19 (*)    Calcium 8.2 (*)    Albumin 3.4 (*)    GFR calc non Af Amer 43 (*)    GFR calc Af Amer 50 (*)    All other components within normal limits  PROTIME-INR  CBG MONITORING, ED  CBG MONITORING, ED    EKG EKG Interpretation  Date/Time:  Saturday May 30 2018 14:09:42 EST Ventricular Rate:  71 PR Interval:    QRS Duration: 154 QT Interval:  454 QTC Calculation: 494 R Axis:   -53 Text Interpretation:  Atrial-paced rhythm RBBB and LAFB No significant change was found Confirmed by Jola Schmidt [id number]) on 05/30/2018 3:08:00 PM   Radiology Ct Angio Head W Or Wo Contrast  Result Date: 05/30/2018 CLINICAL DATA:  Left-sided  weakness and ataxia EXAM: CT ANGIOGRAPHY HEAD AND NECK TECHNIQUE: Multidetector CT imaging of the head and neck was performed using the standard protocol during bolus administration of intravenous contrast. Multiplanar CT image reconstructions and MIPs were obtained to evaluate the vascular anatomy. Carotid stenosis measurements (when applicable) are obtained utilizing NASCET criteria, using the distal internal carotid diameter as the denominator. CONTRAST:  35m ISOVUE-370 IOPAMIDOL (ISOVUE-370) INJECTION 76% COMPARISON:  CTA head neck 04/24/2018 FINDINGS: CTA NECK FINDINGS Aortic arch: Atherosclerotic plaque.  Three vessel branching. Right carotid system: Bulky mixed density plaque at the ICA bulb stenosis measuring up to 60%. No ulceration or beading. Left carotid system: Calcified plaque without flow limiting stenosis or ulceration. Vertebral arteries: Proximal subclavian atherosclerotic calcification without stenosis. Both vertebral arteries are smooth and widely patent to the dura. Dominant left vertebral artery. Skeleton: Remote C1 ring fracture known from 2019 imaging. No fracture displacement. Advanced cervical spine degeneration. Other neck: No significant incidental finding Upper chest: Patchy streaky density in the bilateral apex was also seen previously, compatible with scarring or pneumonitis. Review of the MIP images confirms the above findings CTA HEAD FINDINGS Anterior circulation: Atherosclerotic calcification  of the carotid siphons. No branch occlusion, flow limiting stenosis, beading, or aneurysm. Posterior circulation: Left dominant vertebral artery. The vertebral and basilar arteries are smooth and widely patent. No branch occlusion or flow limiting stenosis. Negative for aneurysm. Venous sinuses: Patent Anatomic variants: None significant Delayed phase: No abnormal intracranial enhancement Review of the MIP images confirms the above findings IMPRESSION: 1. No emergent finding or change from  January 2020. 2. Cervical carotid atherosclerosis with up to 60% stenosis at the right ICA bulb. 3. Chronic lung disease. Electronically Signed   By: Monte Fantasia M.D.   On: 05/30/2018 15:32   Ct Head Wo Contrast  Result Date: 05/30/2018 CLINICAL DATA:  Slurred speech, ataxia, left-sided weakness EXAM: CT HEAD WITHOUT CONTRAST TECHNIQUE: Contiguous axial images were obtained from the base of the skull through the vertex without intravenous contrast. COMPARISON:  05/15/2018, CT neck, 04/24/2018 FINDINGS: Brain: No evidence of acute infarction, hemorrhage, hydrocephalus, extra-axial collection or mass lesion/mass effect. Periventricular white matter hypodensity. Vascular: No hyperdense vessel or unexpected calcification. Skull: Normal. Negative for fracture or focal lesion. Sinuses/Orbits: No acute finding. Other: Redemonstrated fractures of the dens and C1 vertebral body, better evaluated by prior CT examination of the neck. IMPRESSION: 1. No acute intracranial pathology. Small-vessel white matter disease. 2. Redemonstrated fractures of the dens and C1 vertebral body, better evaluated by prior CT examination of the neck. Electronically Signed   By: Eddie Candle M.D.   On: 05/30/2018 15:11   Ct Angio Neck W And/or Wo Contrast  Result Date: 05/30/2018 CLINICAL DATA:  Left-sided weakness and ataxia EXAM: CT ANGIOGRAPHY HEAD AND NECK TECHNIQUE: Multidetector CT imaging of the head and neck was performed using the standard protocol during bolus administration of intravenous contrast. Multiplanar CT image reconstructions and MIPs were obtained to evaluate the vascular anatomy. Carotid stenosis measurements (when applicable) are obtained utilizing NASCET criteria, using the distal internal carotid diameter as the denominator. CONTRAST:  72m ISOVUE-370 IOPAMIDOL (ISOVUE-370) INJECTION 76% COMPARISON:  CTA head neck 04/24/2018 FINDINGS: CTA NECK FINDINGS Aortic arch: Atherosclerotic plaque.  Three vessel branching.  Right carotid system: Bulky mixed density plaque at the ICA bulb stenosis measuring up to 60%. No ulceration or beading. Left carotid system: Calcified plaque without flow limiting stenosis or ulceration. Vertebral arteries: Proximal subclavian atherosclerotic calcification without stenosis. Both vertebral arteries are smooth and widely patent to the dura. Dominant left vertebral artery. Skeleton: Remote C1 ring fracture known from 2019 imaging. No fracture displacement. Advanced cervical spine degeneration. Other neck: No significant incidental finding Upper chest: Patchy streaky density in the bilateral apex was also seen previously, compatible with scarring or pneumonitis. Review of the MIP images confirms the above findings CTA HEAD FINDINGS Anterior circulation: Atherosclerotic calcification of the carotid siphons. No branch occlusion, flow limiting stenosis, beading, or aneurysm. Posterior circulation: Left dominant vertebral artery. The vertebral and basilar arteries are smooth and widely patent. No branch occlusion or flow limiting stenosis. Negative for aneurysm. Venous sinuses: Patent Anatomic variants: None significant Delayed phase: No abnormal intracranial enhancement Review of the MIP images confirms the above findings IMPRESSION: 1. No emergent finding or change from January 2020. 2. Cervical carotid atherosclerosis with up to 60% stenosis at the right ICA bulb. 3. Chronic lung disease. Electronically Signed   By: JMonte FantasiaM.D.   On: 05/30/2018 15:32    Procedures .Critical Care Performed by: CJola Schmidt MD Authorized by: CJola Schmidt MD   Critical care provider statement:    Critical care time (minutes):  31   Critical care was time spent personally by me on the following activities:  Discussions with consultants, evaluation of patient's response to treatment, examination of patient, ordering and performing treatments and interventions, ordering and review of laboratory studies,  ordering and review of radiographic studies, pulse oximetry, re-evaluation of patient's condition, obtaining history from patient or surrogate and review of old charts   (including critical care time)  Medications Ordered in ED Medications  sodium chloride flush (NS) 0.9 % injection 3 mL (3 mLs Intravenous Not Given 05/30/18 1443)  iopamidol (ISOVUE-370) 76 % injection 100 mL (80 mLs Intravenous Contrast Given 05/30/18 1458)     Initial Impression / Assessment and Plan / ED Course  I have reviewed the triage vital signs and the nursing notes.  Pertinent labs & imaging results that were available during my care of the patient were reviewed by me and considered in my medical decision making (see chart for details).        Patient with evidence of likely recurrent acute stroke.  She is outside the window for TPA.  No obvious large vessel occlusion noted on CTA of her head.  Patient symptoms are mildly improving here in the emergency department.  Case discussed with neuro hospitalist who will see the patient in consultation.  [facility] as she had a recent large stroke work-up in February 2020 and likely does not need the majority of that work-up repeated but will benefit from neuro exams and close observation in the hospital setting.  Neurohospitalist: Dr Lorraine Lax  Final Clinical Impressions(s) / ED Diagnoses   Final diagnoses:  None    ED Discharge Orders    None       Jola Schmidt, MD 05/30/18 [phone]

## 2018-05-30 NOTE — H&P (Signed)
History and Physical   Meredith Gonzalez MBP:[id number] DOB: 07-Mar-1936 DOA: 05/30/2018  Referring MD/NP/PA: Jola Schmidt, MD  PCP: Sinclair Ship, MD   Outpatient Specialists: None  Patient coming from: [facility]   Chief Complaint: Left-sided weakness with aphasia  HPI: Meredith Gonzalez is a 83 y.o. female with medical history significant of previous CVA, diabetes, hypertension, cervical spinal disease, chronic bronchitis, basal cell carcinoma, status post pacemaker placement, atrial fibrillation and hypertension, hypothyroidism who presents to the [place] with complaint of left sided weakness dysarthria that happens suddenly this morning.  She was last seen normal at 11:00 went to the ER about 4 hours later.  She was outside the window for any TPA.  Patient reported feeling dizzy and unwell early in the morning.  Son however realized she was weaker and decided to bring her to the ER.  Patient was evaluated for stroke.  She had residual weakness.  Initial CT showed no evidence of CVA.  Patient could not get MRI due to pacemaker.  She was sent over to the hospital for further evaluation.  She still has residual weakness on the left but her speech has improved..  ED Course: Patient's initial temperature was 98 6 with blood pressure 163/71 her pulse of 80 respirate of 20 oxygen sat was 91% room air.  White count 10.7 hemoglobin 10.2 and platelets 234.  Chemistry appeared within normal except for BUN 29 creatinine 1.19 calcium 8.2.  INR is 1.1 glucose 104.  CT angiogram head and neck as well as CT head without contrast showed no acute CVA.  Patient was transferred over for complete CVA work-up.  Review of Systems: As per HPI otherwise 10 point review of systems negative.    Past Medical History:  Diagnosis Date  . Anemia   . Anxiety   . Arthritis    "all over" (02/11/2018)  . Basal cell carcinoma    "left leg" (02/11/2018)  . Cervical spine fracture ([place]) 12/2017   "C1-2"  . CHF (congestive  heart failure) ([name])   . Chronic bronchitis ([place])   . Chronic neck pain    "since I broke my neck 6-8 wk ago" (02/11/2018)  . Diabetic peripheral neuropathy ([place])   . Fibromyalgia   . Fracture of right humerus   . Generalized weakness   . Headache    "weekly" (02/11/2018)  . History of blood transfusion    "related to one of my femur surgeries" (02/11/2018)  . History of echocardiogram    Echo 8/18: EF 50-55, no RWMA, Gr 1 DD, calcified AV leaflets, MAC, trivial MR, mod LAE, PASP 37  . Hypertension   . Hypothyroidism   . Incontinence of urine   . Ischemic stroke ([place]) 2000   "lost part of the vision in my right eye" (02/11/2018)  . Major depression, chronic   . Myocardial infarction ([place]) 06/2016  . Pacemaker   . Recurrent falls   . Syncope and collapse   . Type 2 diabetes, diet controlled ([place])     Past Surgical History:  Procedure Laterality Date  . ABDOMINAL HYSTERECTOMY    . ANKLE FRACTURE SURGERY Right   . BASAL CELL CARCINOMA EXCISION Left    "leg"  . CARDIAC CATHETERIZATION  ~ 2014  . CARDIOVERSION N/A 02/13/2018   Procedure: CARDIOVERSION;  Surgeon: Sueanne Margarita, MD;  Location: [facility] ENDOSCOPY;  Service: Cardiovascular;  Laterality: N/A;  . CARDIOVERSION N/A 03/27/2018   Procedure: CARDIOVERSION;  Surgeon: Lelon Perla, MD;  Location: [facility]  ENDOSCOPY;  Service: Cardiovascular;  Laterality: N/A;  . CARPAL TUNNEL RELEASE     bilateral  . CATARACT EXTRACTION W/ INTRAOCULAR LENS  IMPLANT, BILATERAL Bilateral   . CHOLECYSTECTOMY OPEN    . COLONOSCOPY    . CORONARY ANGIOPLASTY WITH STENT PLACEMENT  06/2016  . CORONARY STENT INTERVENTION N/A 07/19/2016   Procedure: Coronary Stent Intervention;  Surgeon: Peter M Martinique, MD;  Location: [place] CV LAB;  Service: Cardiovascular;  Laterality: N/A;  . EYE SURGERY     right eye catarace/lens implant  . FEMUR FRACTURE SURGERY Bilateral   . FRACTURE SURGERY    . GASTRIC BYPASS    . INSERT / REPLACE / REMOVE PACEMAKER  ~  2012  . LEAD REVISION N/A 01/01/2013   Procedure: LEAD REVISION;  Surgeon: Evans Lance, MD;  Location: [facility] CATH LAB;  Service: Cardiovascular;  Laterality: N/A;  . LEFT HEART CATH AND CORONARY ANGIOGRAPHY N/A 07/19/2016   Procedure: Left Heart Cath and Coronary Angiography;  Surgeon: Peter M Martinique, MD;  Location: [place] CV LAB;  Service: Cardiovascular;  Laterality: N/A;  . OVARIAN CYST SURGERY     "one side only"  . TEE WITHOUT CARDIOVERSION N/A 02/13/2018   Procedure: TRANSESOPHAGEAL ECHOCARDIOGRAM (TEE);  Surgeon: Sueanne Margarita, MD;  Location: [facility] ENDOSCOPY;  Service: Cardiovascular;  Laterality: N/A;  . TONSILLECTOMY       reports that she quit smoking about 45 years ago. Her smoking use included cigarettes. She has a 2.00 pack-year smoking history. She has never used smokeless tobacco. She reports current alcohol use. She reports that she does not use drugs.  Allergies  Allergen Reactions  . Valium [Diazepam] Anxiety    Makes patient hyper Makes patient hyper  . Codeine Hives and Itching    Can take with Benadryl Can take with Benadryl  . Darifenacin Itching    Can take with Benadryl  . Darvon [Propoxyphene Hcl] Itching    Can take with Benadryl  . Daypro [Oxaprozin] Itching    Can take with Benadryl Can take with Benadryl  . Enablex [Darifenacin Hydrobromide Er] Itching    Can take with Benadryl  . Oxycodone Itching    Can take with Benadryl Can take with Benadryl Can take with Benadryl Can take with Benadryl  . Propoxyphene Itching    Can take with Benadryl  . Risperdal [Risperidone] Itching    Can take with Benadryl Can take with Benadryl  . Talwin [Pentazocine] Itching    Can take with Benadryl Can take with Benadryl  . Vicodin [Hydrocodone-Acetaminophen] Itching    Can take with Benadryl Can take with Benadryl    Family History  Problem Relation Age of Onset  . Congestive Heart Failure Mother   . Heart attack Father   . Alcoholism Father   .  Alcoholism Sister   . Alcoholism Brother   . Alcoholism Brother   . Throat cancer Brother      Prior to Admission medications   Medication Sig Start Date End Date Taking? Authorizing Provider  acetaminophen (TYLENOL) 500 MG tablet Take 500 mg by mouth every 6 (six) hours as needed for moderate pain.     [provider]  albuterol (PROVENTIL HFA;VENTOLIN HFA) 108 (90 Base) MCG/ACT inhaler Inhale 2 puffs into the lungs every 6 (six) hours as needed for wheezing or shortness of breath. 05/07/18   Angiulli, Lavon Paganini, PA-C  amiodarone (PACERONE) 200 MG tablet Take 1 tablet (200 mg total) by mouth every morning. 05/07/18  Angiulli, Lavon Paganini, PA-C  apixaban (ELIQUIS) 2.5 MG TABS tablet Take 1 tablet (2.5 mg total) by mouth 2 (two) times daily. 05/07/18   Angiulli, Lavon Paganini, PA-C  atorvastatin (LIPITOR) 40 MG tablet Take 1 tablet (40 mg total) by mouth daily. 05/07/18 08/05/18  Angiulli, Lavon Paganini, PA-C  budesonide-formoterol (SYMBICORT) 160-4.5 MCG/ACT inhaler Inhale 2 puffs into the lungs 2 (two) times daily for 1 day. 05/20/18 05/21/18  Marshell Garfinkel, MD  budesonide-formoterol (SYMBICORT) 160-4.5 MCG/ACT inhaler Inhale 2 puffs into the lungs 2 (two) times daily. 05/20/18   Marshell Garfinkel, MD  Calcium Citrate-Vitamin D3 315-250 MG-UNIT TABS Take 1 tablet by mouth 2 (two) times daily.    [provider]  ferrous sulfate 325 (65 FE) MG tablet Take 1 tablet (325 mg total) by mouth 2 (two) times daily with a meal. 05/07/18   Angiulli, Lavon Paganini, PA-C  flintstones complete (FLINTSTONES) 60 MG chewable tablet Chew 1 tablet by mouth daily.    [provider]  fluticasone (FLONASE) 50 MCG/ACT nasal spray Place 1 spray into both nostrils daily as needed (allergies.).  12/08/17   [provider]  furosemide (LASIX) 20 MG tablet Take 0.5 tablets (10 mg total) by mouth daily. 05/07/18   Angiulli, Lavon Paganini, PA-C  gabapentin (NEURONTIN) 300 MG capsule Take 1-2 capsules (300-600 mg total)  by mouth See admin instructions. Take 1 capsule (300 mg) by mouth in the mornning & take 2 capsules (600 mg) by mouth at night 05/07/18   Angiulli, Lavon Paganini, PA-C  levothyroxine (SYNTHROID, LEVOTHROID) 50 MCG tablet Take 1 tablet (50 mcg total) by mouth daily at 6 (six) AM for 30 days. 05/07/18 06/06/18  Angiulli, Lavon Paganini, PA-C  methocarbamol (ROBAXIN) 500 MG tablet Take 0.5 tablets (250 mg total) by mouth every 8 (eight) hours as needed for muscle spasms. 05/07/18   Angiulli, Lavon Paganini, PA-C  Multiple Vitamins-Minerals (MULTIVITAMIN ADULT) TABS Take 1 tablet by mouth daily.    [provider]  nitroGLYCERIN (NITROSTAT) 0.4 MG SL tablet Place 1 tablet (0.4 mg total) under the tongue every 5 (five) minutes as needed for chest pain. 10/11/16 03/17/18  Fay Records, MD  traZODone (DESYREL) 50 MG tablet Take 0.5 tablets (25 mg total) by mouth at bedtime. 05/07/18   Angiulli, Lavon Paganini, PA-C  vitamin B-12 (CYANOCOBALAMIN) 1000 MCG tablet Take 1 tablet (1,000 mcg total) by mouth daily. 07/20/16   Thurnell Lose, MD    Physical Exam: Vitals:   05/30/18 1600 05/30/18 1630 05/30/18 1750 05/30/18 1820  BP: (!) 153/75 (!) 144/64  (!) 166/71  Pulse: (!) 54 71  66  Resp: _0 Temp:    98.2 F (36.8 C)  TempSrc:    Oral  SpO2: 91% 98%  99%  Weight:   60.5 kg   Height:   _1  (1.651 m)       Constitutional: NAD, calm, comfortable Vitals:   05/30/18 1600 05/30/18 1630 05/30/18 1750 05/30/18 1820  BP: (!) 153/75 (!) 144/64  (!) 166/71  Pulse: (!) 54 71  66  Resp: _2 Temp:    98.2 F (36.8 C)  TempSrc:    Oral  SpO2: 91% 98%  99%  Weight:   60.5 kg   Height:   _3  (1.651 m)    Eyes: PERRL, lids and conjunctivae normal ENMT: Mucous membranes are moist. Posterior pharynx clear of any exudate or lesions.Normal dentition.  Neck: normal, supple, no masses,  no thyromegaly Respiratory: clear to auscultation bilaterally, no wheezing, no crackles. Normal respiratory effort. No  accessory muscle use.  Cardiovascular: Regular rate and rhythm, no murmurs / rubs / gallops. No extremity edema. 2+ pedal pulses. No carotid bruits.  Abdomen: no tenderness, no masses palpated. No hepatosplenomegaly. Bowel sounds positive.  Musculoskeletal: no clubbing / cyanosis. No joint deformity upper and lower extremities. Good ROM, no contractures. Normal muscle tone.  Skin: no rashes, lesions, ulcers. No induration Neurologic: Left-sided hemi-paresis with power 4 out of 5 in both upper and lower extremities respectively, CN 2-12 grossly intact. Sensation intact, DTR normal.  Psychiatric: Normal judgment and insight. Alert and oriented x 3. Normal mood.     Labs on Admission: I have personally reviewed following labs and imaging studies  CBC: Recent Labs  Lab 05/30/18 1413  WBC 10.7*  NEUTROABS 8.8*  HGB 10.2*  HCT 35.1*  MCV 100.6*  PLT 778   Basic Metabolic Panel: Recent Labs  Lab 05/30/18 1413  NA 137  K 3.6  CL 108  CO2 22  GLUCOSE 104*  BUN 29*  CREATININE 1.19*  CALCIUM 8.2*   GFR: Estimated Creatinine Clearance: 33.9 mL/min (A) (by C-G formula based on SCr of 1.19 mg/dL (H)). Liver Function Tests: Recent Labs  Lab 05/30/18 1413  AST 19  ALT 18  ALKPHOS 102  BILITOT 0.7  PROT 7.3  ALBUMIN 3.4*   No results for input(s): LIPASE, AMYLASE in the last 168 hours. No results for input(s): AMMONIA in the last 168 hours. Coagulation Profile: Recent Labs  Lab 05/30/18 1413  INR 1.1   Cardiac Enzymes: No results for input(s): CKTOTAL, CKMB, CKMBINDEX, TROPONINI in the last 168 hours. BNP (last 3 results) No results for input(s): PROBNP in the last 8760 hours. HbA1C: No results for input(s): HGBA1C in the last 72 hours. CBG: Recent Labs  Lab 05/30/18 1410  GLUCAP 87   Lipid Profile: No results for input(s): CHOL, HDL, LDLCALC, TRIG, CHOLHDL, LDLDIRECT in the last 72 hours. Thyroid Function Tests: No results for input(s): TSH, T4TOTAL, FREET4,  T3FREE, THYROIDAB in the last 72 hours. Anemia Panel: No results for input(s): VITAMINB12, FOLATE, FERRITIN, TIBC, IRON, RETICCTPCT in the last 72 hours. Urine analysis: No results found for: COLORURINE, APPEARANCEUR, LABSPEC, PHURINE, GLUCOSEU, HGBUR, BILIRUBINUR, KETONESUR, PROTEINUR, UROBILINOGEN, NITRITE, LEUKOCYTESUR Sepsis Labs: _0 (procalcitonin:4,lacticidven:4) )No results found for this or any previous visit (from the past 240 hour(s)).   Radiological Exams on Admission: Ct Angio Head W Or Wo Contrast  Result Date: 05/30/2018 CLINICAL DATA:  Left-sided weakness and ataxia EXAM: CT ANGIOGRAPHY HEAD AND NECK TECHNIQUE: Multidetector CT imaging of the head and neck was performed using the standard protocol during bolus administration of intravenous contrast. Multiplanar CT image reconstructions and MIPs were obtained to evaluate the vascular anatomy. Carotid stenosis measurements (when applicable) are obtained utilizing NASCET criteria, using the distal internal carotid diameter as the denominator. CONTRAST:  89m ISOVUE-370 IOPAMIDOL (ISOVUE-370) INJECTION 76% COMPARISON:  CTA head neck 04/24/2018 FINDINGS: CTA NECK FINDINGS Aortic arch: Atherosclerotic plaque.  Three vessel branching. Right carotid system: Bulky mixed density plaque at the ICA bulb stenosis measuring up to 60%. No ulceration or beading. Left carotid system: Calcified plaque without flow limiting stenosis or ulceration. Vertebral arteries: Proximal subclavian atherosclerotic calcification without stenosis. Both vertebral arteries are smooth and widely patent to the dura. Dominant left vertebral artery. Skeleton: Remote C1 ring fracture known from 2019 imaging. No fracture displacement. Advanced cervical spine degeneration. Other neck: No significant incidental finding  Upper chest: Patchy streaky density in the bilateral apex was also seen previously, compatible with scarring or pneumonitis. Review of the MIP images confirms  the above findings CTA HEAD FINDINGS Anterior circulation: Atherosclerotic calcification of the carotid siphons. No branch occlusion, flow limiting stenosis, beading, or aneurysm. Posterior circulation: Left dominant vertebral artery. The vertebral and basilar arteries are smooth and widely patent. No branch occlusion or flow limiting stenosis. Negative for aneurysm. Venous sinuses: Patent Anatomic variants: None significant Delayed phase: No abnormal intracranial enhancement Review of the MIP images confirms the above findings IMPRESSION: 1. No emergent finding or change from January 2020. 2. Cervical carotid atherosclerosis with up to 60% stenosis at the right ICA bulb. 3. Chronic lung disease. Electronically Signed   By: Monte Fantasia M.D.   On: 05/30/2018 15:32   Ct Head Wo Contrast  Result Date: 05/30/2018 CLINICAL DATA:  Slurred speech, ataxia, left-sided weakness EXAM: CT HEAD WITHOUT CONTRAST TECHNIQUE: Contiguous axial images were obtained from the base of the skull through the vertex without intravenous contrast. COMPARISON:  05/15/2018, CT neck, 04/24/2018 FINDINGS: Brain: No evidence of acute infarction, hemorrhage, hydrocephalus, extra-axial collection or mass lesion/mass effect. Periventricular white matter hypodensity. Vascular: No hyperdense vessel or unexpected calcification. Skull: Normal. Negative for fracture or focal lesion. Sinuses/Orbits: No acute finding. Other: Redemonstrated fractures of the dens and C1 vertebral body, better evaluated by prior CT examination of the neck. IMPRESSION: 1. No acute intracranial pathology. Small-vessel white matter disease. 2. Redemonstrated fractures of the dens and C1 vertebral body, better evaluated by prior CT examination of the neck. Electronically Signed   By: Eddie Candle M.D.   On: 05/30/2018 15:11   Ct Angio Neck W And/or Wo Contrast  Result Date: 05/30/2018 CLINICAL DATA:  Left-sided weakness and ataxia EXAM: CT ANGIOGRAPHY HEAD AND NECK  TECHNIQUE: Multidetector CT imaging of the head and neck was performed using the standard protocol during bolus administration of intravenous contrast. Multiplanar CT image reconstructions and MIPs were obtained to evaluate the vascular anatomy. Carotid stenosis measurements (when applicable) are obtained utilizing NASCET criteria, using the distal internal carotid diameter as the denominator. CONTRAST:  71m ISOVUE-370 IOPAMIDOL (ISOVUE-370) INJECTION 76% COMPARISON:  CTA head neck 04/24/2018 FINDINGS: CTA NECK FINDINGS Aortic arch: Atherosclerotic plaque.  Three vessel branching. Right carotid system: Bulky mixed density plaque at the ICA bulb stenosis measuring up to 60%. No ulceration or beading. Left carotid system: Calcified plaque without flow limiting stenosis or ulceration. Vertebral arteries: Proximal subclavian atherosclerotic calcification without stenosis. Both vertebral arteries are smooth and widely patent to the dura. Dominant left vertebral artery. Skeleton: Remote C1 ring fracture known from 2019 imaging. No fracture displacement. Advanced cervical spine degeneration. Other neck: No significant incidental finding Upper chest: Patchy streaky density in the bilateral apex was also seen previously, compatible with scarring or pneumonitis. Review of the MIP images confirms the above findings CTA HEAD FINDINGS Anterior circulation: Atherosclerotic calcification of the carotid siphons. No branch occlusion, flow limiting stenosis, beading, or aneurysm. Posterior circulation: Left dominant vertebral artery. The vertebral and basilar arteries are smooth and widely patent. No branch occlusion or flow limiting stenosis. Negative for aneurysm. Venous sinuses: Patent Anatomic variants: None significant Delayed phase: No abnormal intracranial enhancement Review of the MIP images confirms the above findings IMPRESSION: 1. No emergent finding or change from January 2020. 2. Cervical carotid atherosclerosis with up  to 60% stenosis at the right ICA bulb. 3. Chronic lung disease. Electronically Signed   By: JNeva SeatD.  On: 05/30/2018 15:32    EKG: Independently reviewed.  It showed atrial paced rhythm with a rate of 73.  Evidence of interval ventricular conduction abnormalities.  Assessment/Plan Principal Problem:   Stroke [facility]) Active Problems:   Anemia   Persistent atrial fibrillation   Hypertension   Diabetes (HCC)   Pacemaker   CAD (coronary artery disease)   Chronic diastolic congestive heart failure ([place])     #1 suspected CVA: Clinically patient still has decreased power in the left lower extremity.  Suspected acute CVA.  Could not get an MRI now but may be able to get a 48 hours later.  We will admit the patient continue with high-dose statin and aspirin.  Consulted neurology and will follow recommendations.  Get echocardiogram, carotid Dopplers.  #2 persistent atrial fibrillation: Patient is fully anticoagulated.  Rate is also controlled and paced.  Continue treatment.  We will get echocardiogram.  #3 hypertension: Continue with home regimen.  #4 diabetes: Add sliding scale insulin to home regimen and monitor serial CBGs.  #5 coronary artery disease: Status post pacemaker placement.  Appears compensated.  #6 chronic diastolic heart failure: Again patient appears compensated.  Getting a new echo cardiogram.  DVT prophylaxis: Eliquis Code Status: DNR Family Communication: Son at bedside Disposition Plan: To be determined Consults called: Neurology Dr. Katherine Roan Admission status: Inpatient  Severity of Illness: The appropriate patient status for this patient is INPATIENT. Inpatient status is judged to be reasonable and necessary in order to provide the required intensity of service to ensure the patient's safety. The patient's presenting symptoms, physical exam findings, and initial radiographic and laboratory data in the context of their chronic comorbidities is felt to  place them at high risk for further clinical deterioration. Furthermore, it is not anticipated that the patient will be medically stable for discharge from the hospital within 2 midnights of admission. The following factors support the patient status of inpatient.   " The patient's presenting symptoms include left-sided weakness and speech changes. " The worrisome physical exam findings include decreased power in the left upper and lower extremities. " The initial radiographic and laboratory data are worrisome because of head CT without contrast showed no acute CVA. " The chronic co-morbidities include previous CVA with diabetes and hypertension.   * I certify that at the point of admission it is my clinical judgment that the patient will require inpatient hospital care spanning beyond 2 midnights from the point of admission due to high intensity of service, high risk for further deterioration and high frequency of surveillance required.Barbette Merino MD [facility] Pager [phone]  If 7PM-7AM, please contact night-coverage [email or web address] Password [facility]  05/30/2018, 7:25 PM

## 2018-05-30 NOTE — Progress Notes (Signed)
Recent stroke and extensive work up. patient went to med center with LLE weakness and slurred speech. No intervention candidate. Minimal deficits now. Neuro called by ER. Placed in observation .

## 2018-05-30 NOTE — ED Triage Notes (Addendum)
Meredith Gonzalez with slurred speech, ataxia and left sided weakness that began when shoe woke up at 5:30 this AM. She has weakness to the left side and has difficulty with the finger to nose touch on the left side. She has slurred speech and endorses aheadache. SHe takes a blood thinner. Her BP is 146/69. She has a history of a left sided occipital stroke on January 21st. NO residual deficits.

## 2018-05-31 ENCOUNTER — Observation Stay (HOSPITAL_COMMUNITY): Payer: Medicare Other

## 2018-05-31 ENCOUNTER — Observation Stay (HOSPITAL_BASED_OUTPATIENT_CLINIC_OR_DEPARTMENT_OTHER): Payer: Medicare Other

## 2018-05-31 DIAGNOSIS — E1136 Type 2 diabetes mellitus with diabetic cataract: Secondary | ICD-10-CM | POA: Diagnosis present

## 2018-05-31 DIAGNOSIS — N179 Acute kidney failure, unspecified: Secondary | ICD-10-CM | POA: Diagnosis present

## 2018-05-31 DIAGNOSIS — R471 Dysarthria and anarthria: Secondary | ICD-10-CM | POA: Diagnosis present

## 2018-05-31 DIAGNOSIS — I110 Hypertensive heart disease with heart failure: Secondary | ICD-10-CM | POA: Diagnosis present

## 2018-05-31 DIAGNOSIS — Z85828 Personal history of other malignant neoplasm of skin: Secondary | ICD-10-CM | POA: Diagnosis not present

## 2018-05-31 DIAGNOSIS — R1310 Dysphagia, unspecified: Secondary | ICD-10-CM | POA: Diagnosis present

## 2018-05-31 DIAGNOSIS — Z961 Presence of intraocular lens: Secondary | ICD-10-CM | POA: Diagnosis present

## 2018-05-31 DIAGNOSIS — I361 Nonrheumatic tricuspid (valve) insufficiency: Secondary | ICD-10-CM

## 2018-05-31 DIAGNOSIS — I480 Paroxysmal atrial fibrillation: Secondary | ICD-10-CM | POA: Diagnosis not present

## 2018-05-31 DIAGNOSIS — I69354 Hemiplegia and hemiparesis following cerebral infarction affecting left non-dominant side: Secondary | ICD-10-CM | POA: Diagnosis present

## 2018-05-31 DIAGNOSIS — I6521 Occlusion and stenosis of right carotid artery: Secondary | ICD-10-CM | POA: Diagnosis present

## 2018-05-31 DIAGNOSIS — R296 Repeated falls: Secondary | ICD-10-CM | POA: Diagnosis present

## 2018-05-31 DIAGNOSIS — Z950 Presence of cardiac pacemaker: Secondary | ICD-10-CM | POA: Diagnosis not present

## 2018-05-31 DIAGNOSIS — I4819 Other persistent atrial fibrillation: Secondary | ICD-10-CM | POA: Diagnosis present

## 2018-05-31 DIAGNOSIS — Z90710 Acquired absence of both cervix and uterus: Secondary | ICD-10-CM | POA: Diagnosis not present

## 2018-05-31 DIAGNOSIS — I10 Essential (primary) hypertension: Secondary | ICD-10-CM | POA: Diagnosis not present

## 2018-05-31 DIAGNOSIS — M542 Cervicalgia: Secondary | ICD-10-CM | POA: Diagnosis present

## 2018-05-31 DIAGNOSIS — F419 Anxiety disorder, unspecified: Secondary | ICD-10-CM | POA: Diagnosis present

## 2018-05-31 DIAGNOSIS — R0600 Dyspnea, unspecified: Secondary | ICD-10-CM | POA: Diagnosis not present

## 2018-05-31 DIAGNOSIS — M797 Fibromyalgia: Secondary | ICD-10-CM | POA: Diagnosis present

## 2018-05-31 DIAGNOSIS — H532 Diplopia: Secondary | ICD-10-CM | POA: Diagnosis present

## 2018-05-31 DIAGNOSIS — I63332 Cerebral infarction due to thrombosis of left posterior cerebral artery: Secondary | ICD-10-CM | POA: Diagnosis not present

## 2018-05-31 DIAGNOSIS — M4802 Spinal stenosis, cervical region: Secondary | ICD-10-CM | POA: Diagnosis present

## 2018-05-31 DIAGNOSIS — I2510 Atherosclerotic heart disease of native coronary artery without angina pectoris: Secondary | ICD-10-CM | POA: Diagnosis present

## 2018-05-31 DIAGNOSIS — D62 Acute posthemorrhagic anemia: Secondary | ICD-10-CM | POA: Diagnosis present

## 2018-05-31 DIAGNOSIS — W19XXXA Unspecified fall, initial encounter: Secondary | ICD-10-CM | POA: Diagnosis present

## 2018-05-31 DIAGNOSIS — I63231 Cerebral infarction due to unspecified occlusion or stenosis of right carotid arteries: Secondary | ICD-10-CM | POA: Diagnosis not present

## 2018-05-31 DIAGNOSIS — E876 Hypokalemia: Secondary | ICD-10-CM | POA: Diagnosis not present

## 2018-05-31 DIAGNOSIS — D649 Anemia, unspecified: Secondary | ICD-10-CM | POA: Diagnosis present

## 2018-05-31 DIAGNOSIS — I4891 Unspecified atrial fibrillation: Secondary | ICD-10-CM | POA: Diagnosis not present

## 2018-05-31 DIAGNOSIS — E1140 Type 2 diabetes mellitus with diabetic neuropathy, unspecified: Secondary | ICD-10-CM | POA: Diagnosis not present

## 2018-05-31 DIAGNOSIS — R2990 Unspecified symptoms and signs involving the nervous system: Secondary | ICD-10-CM | POA: Diagnosis present

## 2018-05-31 DIAGNOSIS — I639 Cerebral infarction, unspecified: Secondary | ICD-10-CM | POA: Diagnosis present

## 2018-05-31 DIAGNOSIS — I252 Old myocardial infarction: Secondary | ICD-10-CM | POA: Diagnosis not present

## 2018-05-31 DIAGNOSIS — E785 Hyperlipidemia, unspecified: Secondary | ICD-10-CM | POA: Diagnosis present

## 2018-05-31 DIAGNOSIS — G8929 Other chronic pain: Secondary | ICD-10-CM | POA: Diagnosis present

## 2018-05-31 DIAGNOSIS — I482 Chronic atrial fibrillation, unspecified: Secondary | ICD-10-CM | POA: Diagnosis not present

## 2018-05-31 DIAGNOSIS — E1142 Type 2 diabetes mellitus with diabetic polyneuropathy: Secondary | ICD-10-CM | POA: Diagnosis present

## 2018-05-31 DIAGNOSIS — E039 Hypothyroidism, unspecified: Secondary | ICD-10-CM | POA: Diagnosis present

## 2018-05-31 DIAGNOSIS — I509 Heart failure, unspecified: Secondary | ICD-10-CM | POA: Diagnosis present

## 2018-05-31 DIAGNOSIS — Z9841 Cataract extraction status, right eye: Secondary | ICD-10-CM | POA: Diagnosis not present

## 2018-05-31 DIAGNOSIS — Z66 Do not resuscitate: Secondary | ICD-10-CM | POA: Diagnosis present

## 2018-05-31 DIAGNOSIS — I5032 Chronic diastolic (congestive) heart failure: Secondary | ICD-10-CM | POA: Diagnosis present

## 2018-05-31 DIAGNOSIS — F329 Major depressive disorder, single episode, unspecified: Secondary | ICD-10-CM | POA: Diagnosis present

## 2018-05-31 DIAGNOSIS — I69393 Ataxia following cerebral infarction: Secondary | ICD-10-CM | POA: Diagnosis not present

## 2018-05-31 DIAGNOSIS — Z9842 Cataract extraction status, left eye: Secondary | ICD-10-CM | POA: Diagnosis not present

## 2018-05-31 DIAGNOSIS — I63131 Cerebral infarction due to embolism of right carotid artery: Secondary | ICD-10-CM | POA: Diagnosis not present

## 2018-05-31 DIAGNOSIS — R4701 Aphasia: Secondary | ICD-10-CM | POA: Diagnosis present

## 2018-05-31 DIAGNOSIS — I69398 Other sequelae of cerebral infarction: Secondary | ICD-10-CM | POA: Diagnosis not present

## 2018-05-31 DIAGNOSIS — R7989 Other specified abnormal findings of blood chemistry: Secondary | ICD-10-CM | POA: Diagnosis not present

## 2018-05-31 DIAGNOSIS — I69390 Apraxia following cerebral infarction: Secondary | ICD-10-CM | POA: Diagnosis not present

## 2018-05-31 DIAGNOSIS — I69391 Dysphagia following cerebral infarction: Secondary | ICD-10-CM | POA: Diagnosis not present

## 2018-05-31 DIAGNOSIS — M1990 Unspecified osteoarthritis, unspecified site: Secondary | ICD-10-CM | POA: Diagnosis present

## 2018-05-31 DIAGNOSIS — R269 Unspecified abnormalities of gait and mobility: Secondary | ICD-10-CM | POA: Diagnosis not present

## 2018-05-31 LAB — LIPID PANEL
Cholesterol: 82 mg/dL (ref 0–200)
HDL: 50 mg/dL (ref >40)
LDL CALC: 28 mg/dL (ref 0–99)
Total CHOL/HDL Ratio: 1.6 RATIO
Triglycerides: 22 mg/dL (ref <150)
VLDL: 4 mg/dL (ref 0–40)

## 2018-05-31 LAB — CBC
HCT: 31.1 % — ABNORMAL LOW (ref 36.0–46.0)
Hemoglobin: 9.1 g/dL — ABNORMAL LOW (ref 12.0–15.0)
MCH: 29.2 pg (ref 26.0–34.0)
MCHC: 29.3 g/dL — ABNORMAL LOW (ref 30.0–36.0)
MCV: 99.7 fL (ref 80.0–100.0)
Platelets: 207 10*3/uL (ref 150–400)
RBC: 3.12 MIL/uL — AB (ref 3.87–5.11)
RDW: 20.4 % — ABNORMAL HIGH (ref 11.5–15.5)
WBC: 6.5 10*3/uL (ref 4.0–10.5)
nRBC: 0 % (ref 0.0–0.2)

## 2018-05-31 LAB — HEMOGLOBIN A1C
Hgb A1c MFr Bld: 5.7 % — ABNORMAL HIGH (ref 4.8–5.6)
Mean Plasma Glucose: 116.89 mg/dL

## 2018-05-31 LAB — COMPREHENSIVE METABOLIC PANEL
ALK PHOS: 90 U/L (ref 38–126)
ALT: 16 U/L (ref 0–44)
AST: 17 U/L (ref 15–41)
Albumin: 2.6 g/dL — ABNORMAL LOW (ref 3.5–5.0)
Anion gap: 5 (ref 5–15)
BUN: 18 mg/dL (ref 8–23)
CO2: 23 mmol/L (ref 22–32)
CREATININE: 0.8 mg/dL (ref 0.44–1.00)
Calcium: 8.1 mg/dL — ABNORMAL LOW (ref 8.9–10.3)
Chloride: 113 mmol/L — ABNORMAL HIGH (ref 98–111)
GFR calc Af Amer: >60 mL/min (ref >60)
GFR calc non Af Amer: >60 mL/min (ref >60)
Glucose, Bld: 100 mg/dL — ABNORMAL HIGH (ref 70–99)
Potassium: 3.4 mmol/L — ABNORMAL LOW (ref 3.5–5.1)
Sodium: 141 mmol/L (ref 135–145)
Total Bilirubin: 0.7 mg/dL (ref 0.3–1.2)
Total Protein: 5.8 g/dL — ABNORMAL LOW (ref 6.5–8.1)

## 2018-05-31 LAB — ECHOCARDIOGRAM LIMITED
Height: 65 in
Weight: 2134.05 oz

## 2018-05-31 MED ORDER — POTASSIUM CHLORIDE CRYS ER 20 MEQ PO TBCR
40.0000 meq | EXTENDED_RELEASE_TABLET | Freq: Once | ORAL | Status: AC
  Administered 2018-05-31: 40 meq via ORAL
  Filled 2018-05-31: qty 2

## 2018-05-31 MED ORDER — ATORVASTATIN CALCIUM 10 MG PO TABS
20.0000 mg | ORAL_TABLET | Freq: Every day | ORAL | Status: DC
  Administered 2018-06-01 – 2018-06-05 (×4): 20 mg via ORAL
  Filled 2018-05-31 (×4): qty 2

## 2018-05-31 MED ORDER — APIXABAN 5 MG PO TABS
5.0000 mg | ORAL_TABLET | Freq: Two times a day (BID) | ORAL | Status: DC
  Administered 2018-05-31: 5 mg via ORAL
  Filled 2018-05-31: qty 1

## 2018-05-31 NOTE — Care Management Obs Status (Signed)
[place] NOTIFICATION   Patient Details  Name: Meredith Gonzalez MRN: [id number] Date of Birth: April 23, 1935   Medicare Observation Status Notification Given:  Yes    Carles Collet, RN 05/31/2018, 3:19 PM

## 2018-05-31 NOTE — Progress Notes (Signed)
STROKE TEAM PROGRESS NOTE   SUBJECTIVE (INTERVAL HISTORY) No family is at the bedside.  She is sitting in bed, stated that her slurred speech has been resolved.  She still has some weakness on left arm and leg but that was residual deficit from 03/2018 stroke.  She says she came here only because she was imbalance at home and slurred speech.  Although her slurred speech resolved, she does not know whether she can walk well.    OBJECTIVE Vitals:   05/31/18 0300 05/31/18 0700 05/31/18 0816 05/31/18 0819  BP: (!) 122/52     Pulse: 65 68 68   Resp: 17 16 16    Temp: 98.2 F (36.8 C)     TempSrc: Oral     SpO2: 100% 96% 96% 96%  Weight:      Height:        CBC:  Recent Labs  Lab 05/30/18 1413  WBC 10.7*  NEUTROABS 8.8*  HGB 10.2*  HCT 35.1*  MCV 100.6*  PLT 852    Basic Metabolic Panel:  Recent Labs  Lab 05/30/18 1413  NA 137  K 3.6  CL 108  CO2 22  GLUCOSE 104*  BUN 29*  CREATININE 1.19*  CALCIUM 8.2*    Lipid Panel:     Component Value Date/Time   CHOL 122 04/25/2018 0018   CHOL 95 (L) 03/20/2017 0728   TRIG 53 04/25/2018 0018   HDL 59 04/25/2018 0018   HDL 58 03/20/2017 0728   CHOLHDL 2.1 04/25/2018 0018   VLDL 11 04/25/2018 0018   LDLCALC 52 04/25/2018 0018   LDLCALC 28 03/20/2017 0728   HgbA1c:  Lab Results  Component Value Date   HGBA1C 6.3 (H) 04/25/2018   Urine Drug Screen: No results found for: LABOPIA, COCAINSCRNUR, LABBENZ, AMPHETMU, THCU, LABBARB  Alcohol Level     Component Value Date/Time   ETH <10 04/24/2018 0545    IMAGING  Ct Angio Head W Or Wo Contrast Ct Angio Neck W And/or Wo Contrast 05/30/2018 IMPRESSION:  1. No emergent finding or change from January 2020.  2. Cervical carotid atherosclerosis with up to 60% stenosis at the right ICA bulb.  3. Chronic lung disease.   Ct Head Wo Contrast 05/30/2018 IMPRESSION:  1. No acute intracranial pathology. Small-vessel white matter disease.  2. Redemonstrated fractures of the dens  and C1 vertebral body, better evaluated by prior CT examination of the neck.   Ct Head Wo Contrast 05/31/2018 No appreciable infarct or intracranial hemorrhage.  Transthoracic Echocardiogram  05/31/2018 IMPRESSIONS  1. Limited study.  2. The left ventricle has normal systolic function, with an ejection fraction of 60-65%. The cavity size was normal. No evidence of left ventricular regional wall motion abnormalities. Diastolic function was not assessed.  3. The right ventricle has normal systolc function. The cavity was normal. There is no increase in right ventricular wall thickness. Right ventricular systolic pressure is mildly elevated with an estimated pressure of 38.3 mmHg. Device wire present.  4. Left atrial size was mildly dilated.  5. The aortic valve is tricuspid Mild aortic annular calcification noted.  6. The mitral valve is normal in structure. There is mild mitral annular calcification present.  7. The tricuspid valve was normal in structure.  8. The aortic root is normal in size and structure.   Bilateral Carotid Dopplers 05/31/2018 Summary: Right Carotid: Velocities in the right ICA are consistent with a 40-59% stenosis. Left Carotid: Velocities in the left ICA are consistent with a 1-39%  stenosis. Vertebrals:  Bilateral vertebral arteries demonstrate antegrade flow. Subclavians: Normal flow hemodynamics were seen in bilateral subclavian arteries.   PHYSICAL EXAM  Temp:  [97.9 F (36.6 C)-98.6 F (37 C)] 98.2 F (36.8 C) (03/08 0300) Pulse Rate:  [54-80] 68 (03/08 0816) Resp:  [16-20] 16 (03/08 0816) BP: (113-166)/(52-75) 122/52 (03/08 0300) SpO2:  [91 %-100 %] 96 % (03/08 0819) Weight:  [60.5 kg] 60.5 kg (03/07 1750)  General - Well nourished, well developed, in no apparent distress.  Ophthalmologic - fundi not visualized due to noncooperation.  Cardiovascular - Regular rate and rhythm.  Mental Status -  Level of arousal and orientation to time, place, and  person were intact. Language including expression, naming, repetition, comprehension was assessed and found intact. Fund of Knowledge was assessed and was intact.  Cranial Nerves II - XII - II - Visual field intact OU. III, IV, VI - Extraocular movements intact. V - Facial sensation intact bilaterally. VII - mild left nasolabial fold flattening. VIII - Hearing & vestibular intact bilaterally. X - Palate elevates symmetrically. XI - Chin turning & shoulder shrug intact bilaterally. XII - Tongue protrusion intact.  Motor Strength - The patient's strength was normal in RUE and RLE extremities, but left upper extremity 4+/5 and left lower extremity 5/5 proximal and 4/5 distally.  Bulk was normal and fasciculations were absent.   Motor Tone - Muscle tone was assessed at the neck and appendages and was normal.  Reflexes - The patient's reflexes were symmetrical in all extremities and she had no pathological reflexes.  Sensory - Light touch, temperature/pinprick were assessed and were symmetrical.    Coordination - The patient had normal movements in the right hand with no ataxia or dysmetria, however left finger-to-nose slow and mild dysmetria, but proportional to the weakness.  Tremor was absent.  Gait and Station - deferred.    ASSESSMENT/PLAN [name] is a 83 y.o. female with history of a previous stroke, atrial fibrillation on Eliquis, syncope, permanent pacemaker, coronary artery disease with previous MI, hypothyroidism, hypertension, congestive heart failure, and diabetes mellitus presenting with unsteadiness, increased slurred speech, and double vision. She did not receive IV t-PA due to late presentation.  Possible small right brain stroke - not able to have MRI due to pacemaker - no evidence of posterior circulation involvement from history or neuro exam this admission, concerning recurrent stroke symptoms due to right ICA 60% stenosis with significant plaque formation.   Although A. fib as etiology of current stroke cannot be completely excluded, however patient compliant with Eliquis and recent two strokes are with similar left-sided symptoms.  Resultant left mild hemiparesis  CT head - No acute intracranial pathology. Small-vessel white matter disease.   Repeat CT Head - no acute finding  MRI head - PPM  CTA H&N - Cervical carotid atherosclerosis with up to 60% stenosis at the right ICA bulb.   Carotid Doppler - Velocities in the right ICA are consistent with a 40-59% stenosis.  2D Echo  - EF 60 - 65%. No cardiac source of emboli identified.   LDL - 28  HgbA1c 5.7  VTE prophylaxis - Eliquis  Diet - Heart healthy / carb modified with thin liquids.  Eliquis (apixaban) 2.5 mg twice daily prior to admission, now on Eliquis (apixaban) 5 mg twice daily.  Patient counseled to be compliant with her antithrombotic medications  Ongoing aggressive stroke risk factor management  Therapy recommendations:  pending  Disposition:  Pending  History of possible stroke  03/2018 patient was admitted for stroke symptoms.  However, the exact symptoms were not quite clear, including left sided tingling, slurred speech, left-sided ataxia, expressive aphasia and diplopia.  There was question whether this was more consistent with right MCA involvement or posterior circulation ischemia.  MRI cannot be done due to pacemaker.  CT no acute abnormality x2.  CT head and neck right ICA 60% stenosis with bulky soft plaques.  Carotid Doppler negative.  EF 60 to 65% LDL 52 and A1c 6.3.  Dr. Donnetta Hutching was consulted for potential right CEA, but eventually that was not performed given not able to rule out posterior circulation ischemia and a stroke likely due to A. fib not on anticoagulation.  She was discharged with Eliquis, however on lower dose at 2.5 mg twice daily.  Carotid stenosis, right  CTA head and neck in 03/2018 right ICA 60% stenosis with bulky plaque  CT head and neck  in 05/2018 again showed right ICA 60% stenosis with bulky plaque  Dr. Donnetta Hutching was consulted in 03/2018 and recommend CTA if felt to be needed  Taken together of stroke events in 03/2018 and this admission were concerning for symptomatic right ICA stenosis  Vascular surgery again consulted and appreciate recommendation.  Afib on eliquis  Pt started eliquis 03/2018 after last stroke  Currently on 2.5 mg twice daily  Patient is borderline for 2.5 or 5 mg twice daily dosing  Will have pharmacy consultation to consider 5 mg twice daily dosing  Anemia, microcytic  Hemoglobin 10.2-9.1  Likely microcytic anemia  Close monitoring especially after increasing doses of Eliquis  Hypertension  Stable . Permissive hypertension (OK if < 220/120) but gradually normalize in 5-7 days . Long-term BP goal normotensive  Hyperlipidemia  Lipid lowering medication PTA: Lipitor 40 mg daily  LDL 28, goal < 70  Current lipid lowering medication: Lipitor 20 mg daily  Continue statin at discharge  Diabetes  HgbA1c 5.7, goal < 7.0  Controlled  SSI  CBG monitoring  Other Stroke Risk Factors  Advanced age  Former cigarette smoker - quit 24 years ago  ETOH use, advised to drink no more than 1 alcoholic beverage per day.  Coronary artery disease  CHF status post pacemaker  Other Active Problems  CT head redemonstrated fractures of the dens and C1 vertebral body, which was stable from CT cervical spine 12/2017.  Patient has been following with Dr. Vertell Limber for this issue, no surgery was required.  Patient need to make appointment with Dr. Vertell Limber for continued follow-up again ASAP.   Hospital day # 0  I spent  35 minutes in total face-to-face time with the patient, more than 50% of which was spent in counseling and coordination of care, reviewing test results, images and medication, and discussing the diagnosis of recurrent stroke symptoms, right ICA high-grade stenosis, A. fib, history of  stroke, stable cervical spine fracture, treatment plan and potential prognosis. This patient's care requiresreview of multiple databases, neurological assessment, discussion with family, other specialists and medical decision making of high complexity.  I also discussed with Dr. Carlis Abbott and Dr. Algis Liming.   Rosalin Hawking, MD PhD Stroke Neurology 05/31/2018 4:48 PM    To contact Stroke Continuity provider, please refer to [email or web address] After hours, contact General Neurology

## 2018-05-31 NOTE — Progress Notes (Signed)
OT Cancellation Note  Patient Details Name: Charene Mccallister MRN: [id number] DOB: 1935/11/27   Cancelled Treatment:    Reason Eval/Treat Not Completed: Patient at procedure or test/ unavailable(CT). OT will continue to follow for evaluation acutely.   Merri Ray Karrisa Didio 05/31/2018, 1:17 PM  Hulda Humphrey OTR/L Acute Rehabilitation Services Pager: [phone] Office: [phone]

## 2018-05-31 NOTE — Consult Note (Signed)
Neurology Consultation Reason for Consult: Left leg weakness Referring Physician: Jonelle Sidle, M  CC: Left leg weakness  History is obtained from: Patient  HPI: Meredith Gonzalez is a 83 y.o. female with a history of hypertension, stroke, diabetes, who was in her normal state of health until Thursday evening.  She states that she was walking around a store with her son when she became significantly more unsteady.  She also noticed that her speech was more slurred.  Today, she felt like she was worse and was having difficulty getting around with a Rollator and therefore decided to seek care in the emergency department.  She also complains of double vision which is one slightly on top of the other worse at distance  LKW: Thursday evening tpa given?: no, outside window     ROS: A 14 point ROS was performed and is negative except as noted in the HPI.   Past Medical History:  Diagnosis Date  . Anemia   . Anxiety   . Arthritis    "all over" (02/11/2018)  . Basal cell carcinoma    "left leg" (02/11/2018)  . Cervical spine fracture ([place]) 12/2017   "C1-2"  . CHF (congestive heart failure) ([place])   . Chronic bronchitis ([place])   . Chronic neck pain    "since I broke my neck 6-8 wk ago" (02/11/2018)  . Diabetic peripheral neuropathy ([place])   . Fibromyalgia   . Fracture of right humerus   . Generalized weakness   . Headache    "weekly" (02/11/2018)  . History of blood transfusion    "related to one of my femur surgeries" (02/11/2018)  . History of echocardiogram    Echo 8/18: EF 50-55, no RWMA, Gr 1 DD, calcified AV leaflets, MAC, trivial MR, mod LAE, PASP 37  . Hypertension   . Hypothyroidism   . Incontinence of urine   . Ischemic stroke ([place]) 2000   "lost part of the vision in my right eye" (02/11/2018)  . Major depression, chronic   . Myocardial infarction ([place]) 06/2016  . Pacemaker   . Recurrent falls   . Syncope and collapse   . Type 2 diabetes, diet controlled ([place])      Family  History  Problem Relation Age of Onset  . Congestive Heart Failure Mother   . Heart attack Father   . Alcoholism Father   . Alcoholism Sister   . Alcoholism Brother   . Alcoholism Brother   . Throat cancer Brother      Social History:  reports that she quit smoking about 45 years ago. Her smoking use included cigarettes. She has a 2.00 pack-year smoking history. She has never used smokeless tobacco. She reports current alcohol use. She reports that she does not use drugs.   Exam: Current vital signs: BP (!) 122/52 (BP Location: Right Arm)   Pulse 65   Temp 98.2 F (36.8 C) (Oral) Comment: Daylight saving skip 2a and jump to 3.  Resp 17   Ht _0  (1.651 m)   Wt 60.5 kg   SpO2 100%   BMI 22.20 kg/m  Vital signs in last 24 hours: Temp:  [97.9 F (36.6 C)-98.6 F (37 C)] 98.2 F (36.8 C) (03/08 0300) Pulse Rate:  [54-80] 65 (03/08 0300) Resp:  [16-20] 17 (03/08 0300) BP: (113-166)/(52-75) 122/52 (03/08 0300) SpO2:  [91 %-100 %] 100 % (03/08 0300) Weight:  [60.5 kg] 60.5 kg (03/07 1750)   Physical Exam  Constitutional: Appears well-developed and well-nourished.  Psych:  Affect appropriate to situation Eyes: No scleral injection HENT: No OP obstrucion Head: Normocephalic.  Cardiovascular: Normal rate and regular rhythm.  Respiratory: Effort normal, non-labored breathing GI: Soft.  No distension. There is no tenderness.  Skin: WDI  Neuro: Mental Status: Patient is awake, alert, oriented to person, place, month, year, and situation. Patient is able to give a clear and coherent history. No signs of aphasia or neglect Cranial Nerves: II: Visual Fields are full. Pupils are equal, round, and reactive to light.   III,IV, VI: EOMI without ptosis, but she does have vertical diplopia at distance V: Facial sensation is symmetric to temperature VII: Facial movement with ? very mild left facial weakness, + dysarthria VIII: hearing is intact to voice X: Uvula elevates  symmetrically XI: Shoulder shrug is symmetric. XII: tongue is midline without atrophy or fasciculations.  Motor: Tone is normal. Bulk is normal. 5/5 strength was present in all  Sensory: Sensation is symmetric to light touch and temperature in the arms and legs. Deep Tendon Reflexes: 3+ and symmetric in the biceps and patellae.  Cerebellar: Mild difficulty with finger-nose-finger bilaterally   I have reviewed labs in epic and the results pertinent to this consultation are: CMP- creatinine 1.19  I have reviewed the images obtained: CT head- negative, CTA- right carotid with 60% stenosis  Impression: 83 year old female with new onset dysarthria, diplopia and left leg weakness.  I feel like this is most consistent with a small brainstem infarction, rather than right MCA distribution.  She has been admitted for secondary risk factor modification.  Recommendations: 1) lipid panel, A1c 2) echo, telemetry 3) PT, OT, ST 4) continue anticoagulation   Roland Rack, MD [facility] [phone]  If 7pm- 7am, please page neurology on call as listed in [place].

## 2018-05-31 NOTE — Progress Notes (Signed)
PROGRESS NOTE   Meredith Gonzalez  WUJ:[id number]    DOB: February 13, 1936    DOA: 05/30/2018  PCP: Sinclair Ship, MD   I have briefly reviewed patients previous medical records in [facility].  Brief Narrative:  83 year old female, lives alone, ambulates with the help of a Rollator or walker, PMH of DM 2, HTN, hypothyroid, A. fib on Eliquis, CAD/MI, chronic diastolic CHF, PPM, CVA, anxiety and depression, presented to [place] due to slurred speech, double vision, left-sided weakness and a fall that started on 05/28/2018.  Admitted for possible small right brain stroke due to right ICA stenosis.  Neurology and vascular surgery consulted.   Assessment & Plan:   Principal Problem:   Stroke [facility]) Active Problems:   Anemia   Persistent atrial fibrillation   Hypertension   Hypothyroidism   Diabetes ([place])   Pacemaker   CAD (coronary artery disease)   Chronic diastolic congestive heart failure ([place])   1. Possible small right brain stroke: Neurology consulted and assisted with evaluation and management.  Resultant dysarthria, diplopia and left hemiparesis.  CT head: No acute intracranial pathology.  Small vessel white matter disease.  Redemonstrated fractures of the dens and C1 vertebral body.  CTA of the head and neck: No emergent finding or change from January 2020.  Cervical carotid atherosclerosis with up to 60% stenosis at the right ICA bulb.  Chronic lung disease.  Unable to do MRI due to PPM.  Thereby repeated CT head 3/8: No appreciable infarct or intracranial hemorrhage.  TTE: LVEF 60-65%.  Carotid Doppler: Right 40-59% ICA stenosis.  Left 1-39% ICA stenosis.  LDL 28.  A1c 5.7.  As per stroke MD follow-up (discussed with Dr. Erlinda Hong) >patient presentation concerning for recurrent stroke due to symptomatic right ICA stenosis versus A. fib on lower dose of Eliquis with which she is compliant.  She has had 2 admissions with similar presentations of left-sided weakness.  Patient was on Eliquis  2.5 mg twice daily prior to admission, now on Eliquis 5 mg twice daily.  PT recommend CIR, consult placed.  Neurology consulted vascular surgery for input. 2. Right carotid stenosis: During previous admission, vascular surgery was consulted, right CEA was contemplated but eventually not performed because posterior circulation CVA could not be ruled out (no MRI) and stroke was also felt to be due to A. fib not on anticoagulation.  She had then been discharged on Eliquis 2.5 mg twice daily.  As per neurology, given recurrent presentation with similar symptoms, suspect symptomatic right ICA stenosis.  Vascular surgery input appreciated, await final recommendations. 3. Paroxysmal A. fib: Patient started Eliquis after last stroke in January 2020.  She was on 2.5 mg twice daily.  Given recurrent stroke and room for increase, Eliquis dose increased to 5 mg twice daily.  She is A paced or sinus rhythm on telemetry. 4. Type II DM: A1c 5.7, goal <7. 5. Hyperlipidemia: LDL 28, goal <70.  Atorvastatin reduced from 40 mg daily to 20 mg daily. 6. Essential hypertension: Allow permissive hypertension.  Currently controlled off of meds. 7. CAD: No anginal symptoms. 8. Chronic diastolic CHF: TTE results as above.  Discontinued IV fluids.  Continue home dose of furosemide 10 mg daily. 9. Status post PPM: 10. Fracture of dens and C1 vertebral body: Noted on CT head, stable from CT spine 12/2017.  Outpatient follow-up with Dr. Vertell Limber, neurosurgery. 11. Anemia: Stable. 12. Hypokalemia: Replace and follow. 12. Possible COPD: Seen by pulmonology on 2/28, had abnormal PFTs  and has been scheduled to get high resolution CT on 3/12.  Started on Symbicort.   DVT prophylaxis: Eliquis Code Status: DNR Family Communication: Discussed in detail with patient's son, updated care and answered questions. Disposition: To be determined pending clinical improvement and rehab evaluation.  Patient is suspected to have small right brain  stroke due to symptomatic high-grade right ICA stenosis.  She warrants further inpatient evaluation including by VVS regarding right CEA and rehab MD given left-sided weakness, fall, lives alone and unsafe to return home by herself.  She will thereby stay overnight.  Changed observation to inpatient status.   Consultants:  Neurology Vascular surgery  Procedures:  None  Antimicrobials:  None   Subjective: Reports symptoms actually started on Thursday prior to admission.  Slurred speech has resolved.  Still has double vision where images show up on top of each other.  Left lower extremity weakness.  No left upper extremity weakness.  Fell 2 days ago backwards with bruises on her back.  Patient interviewed and examined along with RN in room.  ROS: As above, otherwise negative.  Objective:  Vitals:   05/31/18 0700 05/31/18 0816 05/31/18 0819 05/31/18 1557  BP:    (!) 115/52  Pulse: 68 68  67  Resp: 16 16  15   Temp:      TempSrc:      SpO2: 96% 96% 96% 100%  Weight:      Height:        Examination:  General exam: Pleasant elderly female, small built and nourished sitting up comfortably in bed without distress. Respiratory system: Clear to auscultation. Respiratory effort normal. Cardiovascular system: S1 & S2 heard, RRR. No JVD, murmurs, rubs, gallops or clicks. No pedal edema.  Telemetry personally reviewed: Sinus rhythm/on demand a paced rhythm. Gastrointestinal system: Abdomen is nondistended, soft and nontender. No organomegaly or masses felt. Normal bowel sounds heard. Central nervous system: Alert and oriented. No focal neurological deficits. Extremities: 5 x 5 power in right extremities and grade 4 x 5 power in left extremities. Skin: Patient has couple of small bruises on her back. Psychiatry: Judgement and insight appear normal. Mood & affect appropriate.     Data Reviewed: I have personally reviewed following labs and imaging studies  CBC: Recent Labs  Lab  05/30/18 1413 05/31/18 1148  WBC 10.7* 6.5  NEUTROABS 8.8*  --   HGB 10.2* 9.1*  HCT 35.1* 31.1*  MCV 100.6* 99.7  PLT 234 099   Basic Metabolic Panel: Recent Labs  Lab 05/30/18 1413 05/31/18 1148  NA 137 141  K 3.6 3.4*  CL 108 113*  CO2 22 23  GLUCOSE 104* 100*  BUN 29* 18  CREATININE 1.19* 0.80  CALCIUM 8.2* 8.1*   Liver Function Tests: Recent Labs  Lab 05/30/18 1413 05/31/18 1148  AST 19 17  ALT 18 16  ALKPHOS 102 90  BILITOT 0.7 0.7  PROT 7.3 5.8*  ALBUMIN 3.4* 2.6*   Coagulation Profile: Recent Labs  Lab 05/30/18 1413  INR 1.1   Cardiac Enzymes: No results for input(s): CKTOTAL, CKMB, CKMBINDEX, TROPONINI in the last 168 hours. HbA1C: Recent Labs    05/31/18 1148  HGBA1C 5.7*   CBG: Recent Labs  Lab 05/30/18 1410  GLUCAP 87    No results found for this or any previous visit (from the past 240 hour(s)).       Radiology Studies: Ct Angio Head W Or Wo Contrast  Result Date: 05/30/2018 CLINICAL DATA:  Left-sided weakness and  ataxia EXAM: CT ANGIOGRAPHY HEAD AND NECK TECHNIQUE: Multidetector CT imaging of the head and neck was performed using the standard protocol during bolus administration of intravenous contrast. Multiplanar CT image reconstructions and MIPs were obtained to evaluate the vascular anatomy. Carotid stenosis measurements (when applicable) are obtained utilizing NASCET criteria, using the distal internal carotid diameter as the denominator. CONTRAST:  86mL ISOVUE-370 IOPAMIDOL (ISOVUE-370) INJECTION 76% COMPARISON:  CTA head neck 04/24/2018 FINDINGS: CTA NECK FINDINGS Aortic arch: Atherosclerotic plaque.  Three vessel branching. Right carotid system: Bulky mixed density plaque at the ICA bulb stenosis measuring up to 60%. No ulceration or beading. Left carotid system: Calcified plaque without flow limiting stenosis or ulceration. Vertebral arteries: Proximal subclavian atherosclerotic calcification without stenosis. Both vertebral  arteries are smooth and widely patent to the dura. Dominant left vertebral artery. Skeleton: Remote C1 ring fracture known from 2019 imaging. No fracture displacement. Advanced cervical spine degeneration. Other neck: No significant incidental finding Upper chest: Patchy streaky density in the bilateral apex was also seen previously, compatible with scarring or pneumonitis. Review of the MIP images confirms the above findings CTA HEAD FINDINGS Anterior circulation: Atherosclerotic calcification of the carotid siphons. No branch occlusion, flow limiting stenosis, beading, or aneurysm. Posterior circulation: Left dominant vertebral artery. The vertebral and basilar arteries are smooth and widely patent. No branch occlusion or flow limiting stenosis. Negative for aneurysm. Venous sinuses: Patent Anatomic variants: None significant Delayed phase: No abnormal intracranial enhancement Review of the MIP images confirms the above findings IMPRESSION: 1. No emergent finding or change from January 2020. 2. Cervical carotid atherosclerosis with up to 60% stenosis at the right ICA bulb. 3. Chronic lung disease. Electronically Signed   By: Monte Fantasia M.D.   On: 05/30/2018 15:32   Ct Head Wo Contrast  Result Date: 05/31/2018 CLINICAL DATA:  Stroke follow-up this eat (left-sided weakness and ataxia EXAM: CT HEAD WITHOUT CONTRAST TECHNIQUE: Contiguous axial images were obtained from the base of the skull through the vertex without intravenous contrast. COMPARISON:  05/30/2018 FINDINGS: Brain: No evidence of acute infarction, hemorrhage, hydrocephalus, extra-axial collection or mass lesion/mass effect. Mild for age chronic small vessel ischemia in the cerebral white matter. Age congruent cerebral volume loss. 2 small sulcal or cortical calcification in the right parietal region that is stable from at least 12/28/2017 Vascular: Atherosclerotic calcification Skull: Remote C1 ring fractures with unchanged alignment on cervical  CT yesterday. Sinuses/Orbits: Bilateral cataract resection IMPRESSION: No appreciable infarct or intracranial hemorrhage. Electronically Signed   By: Monte Fantasia M.D.   On: 05/31/2018 13:12   Ct Head Wo Contrast  Result Date: 05/30/2018 CLINICAL DATA:  Slurred speech, ataxia, left-sided weakness EXAM: CT HEAD WITHOUT CONTRAST TECHNIQUE: Contiguous axial images were obtained from the base of the skull through the vertex without intravenous contrast. COMPARISON:  05/15/2018, CT neck, 04/24/2018 FINDINGS: Brain: No evidence of acute infarction, hemorrhage, hydrocephalus, extra-axial collection or mass lesion/mass effect. Periventricular white matter hypodensity. Vascular: No hyperdense vessel or unexpected calcification. Skull: Normal. Negative for fracture or focal lesion. Sinuses/Orbits: No acute finding. Other: Redemonstrated fractures of the dens and C1 vertebral body, better evaluated by prior CT examination of the neck. IMPRESSION: 1. No acute intracranial pathology. Small-vessel white matter disease. 2. Redemonstrated fractures of the dens and C1 vertebral body, better evaluated by prior CT examination of the neck. Electronically Signed   By: Eddie Candle M.D.   On: 05/30/2018 15:11   Ct Angio Neck W And/or Wo Contrast  Result Date: 05/30/2018 CLINICAL DATA:  Left-sided weakness and ataxia EXAM: CT ANGIOGRAPHY HEAD AND NECK TECHNIQUE: Multidetector CT imaging of the head and neck was performed using the standard protocol during bolus administration of intravenous contrast. Multiplanar CT image reconstructions and MIPs were obtained to evaluate the vascular anatomy. Carotid stenosis measurements (when applicable) are obtained utilizing NASCET criteria, using the distal internal carotid diameter as the denominator. CONTRAST:  11mL ISOVUE-370 IOPAMIDOL (ISOVUE-370) INJECTION 76% COMPARISON:  CTA head neck 04/24/2018 FINDINGS: CTA NECK FINDINGS Aortic arch: Atherosclerotic plaque.  Three vessel branching.  Right carotid system: Bulky mixed density plaque at the ICA bulb stenosis measuring up to 60%. No ulceration or beading. Left carotid system: Calcified plaque without flow limiting stenosis or ulceration. Vertebral arteries: Proximal subclavian atherosclerotic calcification without stenosis. Both vertebral arteries are smooth and widely patent to the dura. Dominant left vertebral artery. Skeleton: Remote C1 ring fracture known from 2019 imaging. No fracture displacement. Advanced cervical spine degeneration. Other neck: No significant incidental finding Upper chest: Patchy streaky density in the bilateral apex was also seen previously, compatible with scarring or pneumonitis. Review of the MIP images confirms the above findings CTA HEAD FINDINGS Anterior circulation: Atherosclerotic calcification of the carotid siphons. No branch occlusion, flow limiting stenosis, beading, or aneurysm. Posterior circulation: Left dominant vertebral artery. The vertebral and basilar arteries are smooth and widely patent. No branch occlusion or flow limiting stenosis. Negative for aneurysm. Venous sinuses: Patent Anatomic variants: None significant Delayed phase: No abnormal intracranial enhancement Review of the MIP images confirms the above findings IMPRESSION: 1. No emergent finding or change from January 2020. 2. Cervical carotid atherosclerosis with up to 60% stenosis at the right ICA bulb. 3. Chronic lung disease. Electronically Signed   By: Monte Fantasia M.D.   On: 05/30/2018 15:32   Vas US Carotid (at [place] Only)  Result Date: 05/31/2018 Carotid Arterial Duplex Study Indications:       CTA of neck shows 60% right ICA stenosis. Comparison Study:  Prior study from 04/24/18 is available for comparison Performing Technologist: Sharion Dove RVS  Examination Guidelines: A complete evaluation includes B-mode imaging, spectral Doppler, color Doppler, and power Doppler as needed of all accessible portions of each vessel.  Bilateral testing is considered an integral part of a complete examination. Limited examinations for reoccurring indications may be performed as noted.  Right Carotid Findings: +----------+--------+--------+--------+---------------------+------------------+           PSV cm/sEDV cm/sStenosisDescribe             Comments           +----------+--------+--------+--------+---------------------+------------------+ CCA Prox  80      19                                   intimal thickening +----------+--------+--------+--------+---------------------+------------------+ CCA Distal78      18                                   intimal thickening +----------+--------+--------+--------+---------------------+------------------+ ICA Prox  134     39      40-59%  calcific and         Shadowing  irregular                               +----------+--------+--------+--------+---------------------+------------------+ ICA Mid   125     38                                                      +----------+--------+--------+--------+---------------------+------------------+ ICA Distal130     30                                                      +----------+--------+--------+--------+---------------------+------------------+ ECA       163     14                                                      +----------+--------+--------+--------+---------------------+------------------+ +----------+--------+-------+--------+-------------------+           PSV cm/sEDV cmsDescribeArm Pressure (mmHG) +----------+--------+-------+--------+-------------------+ Subclavian102                                        +----------+--------+-------+--------+-------------------+ +---------+--------+--+--------+--+ VertebralPSV cm/s44EDV cm/s12 +---------+--------+--+--------+--+  Left Carotid Findings:  +----------+--------+--------+--------+--------+------------------+           PSV cm/sEDV cm/sStenosisDescribeComments           +----------+--------+--------+--------+--------+------------------+ CCA Prox  135     28                      intimal thickening +----------+--------+--------+--------+--------+------------------+ CCA Distal81      28                      intimal thickening +----------+--------+--------+--------+--------+------------------+ ICA Prox  93      33              calcificShadowing          +----------+--------+--------+--------+--------+------------------+ ICA Distal78      25                                         +----------+--------+--------+--------+--------+------------------+ ECA       120     22                                         +----------+--------+--------+--------+--------+------------------+ +----------+--------+--------+--------+-------------------+ SubclavianPSV cm/sEDV cm/sDescribeArm Pressure (mmHG) +----------+--------+--------+--------+-------------------+           160                                         +----------+--------+--------+--------+-------------------+ +---------+--------+--+--------+--+ VertebralPSV cm/s55EDV cm/s14 +---------+--------+--+--------+--+  Summary: Right Carotid: Velocities in the right ICA are consistent with a 40-59%  stenosis. Left Carotid: Velocities in the left ICA are consistent with a 1-39% stenosis. Vertebrals:  Bilateral vertebral arteries demonstrate antegrade flow. Subclavians: Normal flow hemodynamics were seen in bilateral subclavian              arteries. *See table(s) above for measurements and observations.     Preliminary         Scheduled Meds: . amiodarone  200 mg Oral [id number]  . apixaban  5 mg Oral BID  . [START ON 06/01/2018] atorvastatin  20 mg Oral Daily  . calcium-vitamin D  1 tablet Oral BID  . ferrous sulfate  325 mg Oral BID WC  .  furosemide  10 mg Oral Daily  . gabapentin  300 mg Oral q morning - 10a  . gabapentin  600 mg Oral QPM  . levothyroxine  50 mcg Oral Q0600  . mometasone-formoterol  2 puff Inhalation BID  . multivitamin with minerals  1 tablet Oral Daily  . sodium chloride flush  3 mL Intravenous Once  . traZODone  25 mg Oral QHS  . vitamin B-12  1,000 mcg Oral Daily   Continuous Infusions:   LOS: 0 days     Vernell Leep, MD, FACP, [facility]. [facility]  To contact the attending provider between 7A-7P or the covering provider during after hours 7P-7A, please log into the web site [email or web address] and access using universal [place] password for that web site. If you do not have the password, please call the hospital operator.  05/31/2018, 5:14 PM

## 2018-05-31 NOTE — Consult Note (Signed)
Hospital Consult    Reason for Consult:  Concern for symptomatic right carotid stenosis Referring Physician:  Neurology MRN #:  [id number]  History of Present Illness: This is a 83 y.o. female with history of atrial fibrillation on Eliquis, coronary artery disease, hypertension, hyperlipidemia, diabetes that vascular surgery has been consulted for concern for symptomatic right carotid stenosis.  Patient presented as a transfer from [facility] with complaint of unsteadiness, left leg weakness, and dysarthria since Thursday.  Patient was previously evaluated by Dr. Donnetta Hutching in the beginning of February for carotid disease when the patient presented with left arm weakness and was noted to have a 60% right carotid stenosis.  Ultimately neurology determined that this was likely a posterior circulation TIA and carotid intervention was not recommended.  Patient had a second event that started on Thursday night with imbalance, left leg weakness, and dysarthria per her report.  Ultimately she presented to the hospital on Friday for further work-up.  Cannot get an MRI since has a pacemaker. She states she is functional and walks with a walker.  She denies any previous neck surgery or neck radiation.  Is on Eliquis now for atrial fibrillation.  Last dose was this morning.  Past Medical History:  Diagnosis Date  . Anemia   . Anxiety   . Arthritis    "all over" (02/11/2018)  . Basal cell carcinoma    "left leg" (02/11/2018)  . Cervical spine fracture ([place]) 12/2017   "C1-2"  . CHF (congestive heart failure) ([place])   . Chronic bronchitis ([place])   . Chronic neck pain    "since I broke my neck 6-8 wk ago" (02/11/2018)  . Diabetic peripheral neuropathy ([place])   . Fibromyalgia   . Fracture of right humerus   . Generalized weakness   . Headache    "weekly" (02/11/2018)  . History of blood transfusion    "related to one of my femur surgeries" (02/11/2018)  . History of echocardiogram    Echo 8/18:  EF 50-55, no RWMA, Gr 1 DD, calcified AV leaflets, MAC, trivial MR, mod LAE, PASP 37  . Hypertension   . Hypothyroidism   . Incontinence of urine   . Ischemic stroke ([place]) 2000   "lost part of the vision in my right eye" (02/11/2018)  . Major depression, chronic   . Myocardial infarction ([place]) 06/2016  . Pacemaker   . Recurrent falls   . Syncope and collapse   . Type 2 diabetes, diet controlled ([place])     Past Surgical History:  Procedure Laterality Date  . ABDOMINAL HYSTERECTOMY    . ANKLE FRACTURE SURGERY Right   . BASAL CELL CARCINOMA EXCISION Left    "leg"  . CARDIAC CATHETERIZATION  ~ 2014  . CARDIOVERSION N/A 02/13/2018   Procedure: CARDIOVERSION;  Surgeon: Sueanne Margarita, MD;  Location: [facility] ENDOSCOPY;  Service: Cardiovascular;  Laterality: N/A;  . CARDIOVERSION N/A 03/27/2018   Procedure: CARDIOVERSION;  Surgeon: Lelon Perla, MD;  Location: [facility] ENDOSCOPY;  Service: Cardiovascular;  Laterality: N/A;  . CARPAL TUNNEL RELEASE     bilateral  . CATARACT EXTRACTION W/ INTRAOCULAR LENS  IMPLANT, BILATERAL Bilateral   . CHOLECYSTECTOMY OPEN    . COLONOSCOPY    . CORONARY ANGIOPLASTY WITH STENT PLACEMENT  06/2016  . CORONARY STENT INTERVENTION N/A 07/19/2016   Procedure: Coronary Stent Intervention;  Surgeon: Peter M Martinique, MD;  Location: [place] CV LAB;  Service: Cardiovascular;  Laterality: N/A;  . EYE SURGERY  right eye catarace/lens implant  . FEMUR FRACTURE SURGERY Bilateral   . FRACTURE SURGERY    . GASTRIC BYPASS    . INSERT / REPLACE / REMOVE PACEMAKER  ~ 2012  . LEAD REVISION N/A 01/01/2013   Procedure: LEAD REVISION;  Surgeon: Evans Lance, MD;  Location: [facility] CATH LAB;  Service: Cardiovascular;  Laterality: N/A;  . LEFT HEART CATH AND CORONARY ANGIOGRAPHY N/A 07/19/2016   Procedure: Left Heart Cath and Coronary Angiography;  Surgeon: Peter M Martinique, MD;  Location: [place] CV LAB;  Service: Cardiovascular;  Laterality: N/A;  . OVARIAN CYST SURGERY      "one side only"  . TEE WITHOUT CARDIOVERSION N/A 02/13/2018   Procedure: TRANSESOPHAGEAL ECHOCARDIOGRAM (TEE);  Surgeon: Sueanne Margarita, MD;  Location: [facility] ENDOSCOPY;  Service: Cardiovascular;  Laterality: N/A;  . TONSILLECTOMY      Allergies  Allergen Reactions  . Valium [Diazepam] Anxiety    Makes patient hyper  . Codeine Hives and Itching    Can take with Benadryl   . Darifenacin Itching    Can take with Benadryl  . Darvon [Propoxyphene Hcl] Itching    Can take with Benadryl  . Daypro [Oxaprozin] Itching    Can take with Benadryl  . Enablex [Darifenacin Hydrobromide Er] Itching    Can take with Benadryl  . Oxycodone Itching    Can take with Benadryl   . Propoxyphene Itching    Can take with Benadryl  . Risperdal [Risperidone] Itching    Can take with Benadryl  . Talwin [Pentazocine] Itching    Can take with Benadryl  . Vicodin [Hydrocodone-Acetaminophen] Itching    Can take with Benadryl     Prior to Admission medications   Medication Sig Start Date End Date Taking? Authorizing Provider  acetaminophen (TYLENOL) 500 MG tablet Take 500 mg by mouth every 6 (six) hours as needed for moderate pain.    Yes [provider]  albuterol (PROVENTIL HFA;VENTOLIN HFA) 108 (90 Base) MCG/ACT inhaler Inhale 2 puffs into the lungs every 6 (six) hours as needed for wheezing or shortness of breath. 05/07/18  Yes Angiulli, Lavon Paganini, PA-C  amiodarone (PACERONE) 200 MG tablet Take 1 tablet (200 mg total) by mouth every morning. Patient taking differently: Take 200 mg by mouth daily.  05/07/18  Yes Angiulli, Lavon Paganini, PA-C  apixaban (ELIQUIS) 2.5 MG TABS tablet Take 1 tablet (2.5 mg total) by mouth 2 (two) times daily. 05/07/18  Yes Angiulli, Lavon Paganini, PA-C  atorvastatin (LIPITOR) 40 MG tablet Take 1 tablet (40 mg total) by mouth daily. 05/07/18 08/05/18 Yes Angiulli, Lavon Paganini, PA-C  budesonide-formoterol (SYMBICORT) 160-4.5 MCG/ACT inhaler Inhale 2 puffs into the lungs 2 (two) times  daily. 05/20/18  Yes Mannam, Praveen, MD  Calcium Citrate-Vitamin D3 315-250 MG-UNIT TABS Take 1 tablet by mouth 2 (two) times daily.   Yes [provider]  Cholecalciferol (VITAMIN D3 PO) Take 1 tablet by mouth 2 (two) times daily.   Yes [provider]  ferrous sulfate 325 (65 FE) MG tablet Take 1 tablet (325 mg total) by mouth 2 (two) times daily with a meal. 05/07/18  Yes Angiulli, Lavon Paganini, PA-C  flintstones complete (FLINTSTONES) 60 MG chewable tablet Chew 1 tablet by mouth daily.   Yes [provider]  fluticasone (FLONASE) 50 MCG/ACT nasal spray Place 1 spray into both nostrils daily as needed (allergies.).  12/08/17  Yes [provider]  furosemide (LASIX) 20 MG tablet Take 0.5 tablets (10 mg total)  by mouth daily. 05/07/18  Yes Angiulli, Lavon Paganini, PA-C  gabapentin (NEURONTIN) 300 MG capsule Take 1-2 capsules (300-600 mg total) by mouth See admin instructions. Take 1 capsule (300 mg) by mouth in the mornning & take 2 capsules (600 mg) by mouth at night Patient taking differently: Take 300-600 mg by mouth See admin instructions. Take 1 capsule (300 mg) by mouth in the morning & take 2 capsules (600 mg) by mouth at night 05/07/18  Yes Angiulli, Lavon Paganini, PA-C  levothyroxine (SYNTHROID, LEVOTHROID) 50 MCG tablet Take 1 tablet (50 mcg total) by mouth daily at 6 (six) AM for 30 days. 05/07/18 06/06/18 Yes Angiulli, Lavon Paganini, PA-C  vitamin B-12 (CYANOCOBALAMIN) 1000 MCG tablet Take 1 tablet (1,000 mcg total) by mouth daily. 07/20/16  Yes Thurnell Lose, MD  budesonide-formoterol [facility]) 160-4.5 MCG/ACT inhaler Inhale 2 puffs into the lungs 2 (two) times daily for 1 day. Patient not taking: Reported on 05/30/2018 05/20/18 05/21/18  Marshell Garfinkel, MD  methocarbamol (ROBAXIN) 500 MG tablet Take 0.5 tablets (250 mg total) by mouth every 8 (eight) hours as needed for muscle spasms. Patient not taking: Reported on 05/30/2018 05/07/18   Angiulli, Lavon Paganini, PA-C  nitroGLYCERIN  (NITROSTAT) 0.4 MG SL tablet Place 1 tablet (0.4 mg total) under the tongue every 5 (five) minutes as needed for chest pain. Patient not taking: Reported on 05/30/2018 10/11/16 03/17/18  Fay Records, MD  traZODone (DESYREL) 50 MG tablet Take 0.5 tablets (25 mg total) by mouth at bedtime. Patient not taking: Reported on 05/30/2018 05/07/18   Angiulli, Lavon Paganini, PA-C    Social History   Socioeconomic History  . Marital status: Widowed    Spouse name: Not on file  . Number of children: Not on file  . Years of education: Not on file  . Highest education level: Not on file  Occupational History  . Not on file  Social Needs  . Financial resource strain: Not hard at all  . Food insecurity:    Worry: Never true    Inability: Never true  . Transportation needs:    Medical: No    Non-medical: No  Tobacco Use  . Smoking status: Former Smoker    Packs/day: 0.50    Years: 4.00    Pack years: 2.00    Types: Cigarettes    Last attempt to quit: 08/07/1972    Years since quitting: 45.8  . Smokeless tobacco: Never Used  Substance and Sexual Activity  . Alcohol use: Yes    Frequency: Never    Comment: 02/11/2018 "couple drinks/year; if that"  . Drug use: Never  . Sexual activity: Not on file  Lifestyle  . Physical activity:    Days per week: 0 days    Minutes per session: 0 min  . Stress: Only a little  Relationships  . Social connections:    Talks on phone: Three times a week    Gets together: Three times a week    Attends religious service: More than 4 times per year    Active member of club or organization: No    Attends meetings of clubs or organizations: Never    Relationship status: Widowed  . Intimate partner violence:    Fear of current or ex partner: No    Emotionally abused: No    Physically abused: No    Forced sexual activity: No  Other Topics Concern  . Not on file  Social History Narrative  . Not on file  Family History  Problem Relation Age of Onset  .  Congestive Heart Failure Mother   . Heart attack Father   . Alcoholism Father   . Alcoholism Sister   . Alcoholism Brother   . Alcoholism Brother   . Throat cancer Brother     ROS: _0  Positive   _1  Negative   _2  All sytems reviewed and are negative  Cardiovascular: _3  chest pain/pressure _4  palpitations _5  SOB lying flat _6  DOE _7  pain in legs while walking _8  pain in legs at rest _9  pain in legs at night _10  non-healing ulcers _11  hx of DVT _12  swelling in legs  Pulmonary: _13  productive cough _14  asthma/wheezing _15  home O2  Neurologic: _16  weakness in _17  arms _18  legs (left) _19  numbness in _20  arms _21  legs _22  hx of CVA _23  mini stroke _24 difficulty speaking or slurred speech _25  temporary loss of vision in one eye _26  dizziness  Hematologic: _27  hx of cancer _28  bleeding problems _29  problems with blood clotting easily  Endocrine:   _30  diabetes _31  thyroid disease  GI _32  vomiting blood _33  blood in stool  GU: _34  CKD/renal failure _35  HD--_36  M/W/F or _37  T/T/S _38  burning with urination _39  blood in urine  Psychiatric: _40  anxiety _41  depression  Musculoskeletal: _42  arthritis _43  joint pain  Integumentary: _44  rashes _45  ulcers  Constitutional: _46  fever _47  chills   Physical Examination  Vitals:   05/31/18 0816 05/31/18 0819  BP:    Pulse: 68   Resp: 16   Temp:    SpO2: 96% 96%   Body mass index is 22.2 kg/m.  General:  NAD Gait: Not observed HENT: WNL, normocephalic Pulmonary: normal non-labored breathing, without Rales, rhonchi,  wheezing Cardiac: Left chest wall ICD Abdomen: soft, NT/ND, no masses Skin: without rashes Vascular Exam/Pulses: Carotid pulse 2+ bilaterally, radial pulses 2+ bilaterally, femoral and DP pulses 2+ bilaterally Extremities: without ischemic changes, without Gangrene , without cellulitis; without open wounds;  Musculoskeletal: no muscle wasting or atrophy  Neurologic: A&O X 3; Appropriate Affect ; SENSATION: normal;  MOTOR FUNCTION:  moving all extremities equally. Speech is fluent/normal   CBC    Component Value Date/Time   WBC 6.5 05/31/2018 1148   RBC 3.12 (L) 05/31/2018 1148   HGB 9.1 (L) 05/31/2018 1148   HGB 10.2 (L) 05/19/2018 1612   HCT 31.1 (L) 05/31/2018 1148   HCT 32.2 (L) 05/19/2018 1612   PLT 207 05/31/2018 1148   PLT 286 05/19/2018 1612   MCV 99.7 05/31/2018 1148   MCV 91 05/19/2018 1612   MCH 29.2 05/31/2018 1148   MCHC 29.3 (L) 05/31/2018 1148   RDW 20.4 (H) 05/31/2018 1148   RDW 16.8 (H) 05/19/2018 1612   LYMPHSABS 0.8 05/30/2018 1413   MONOABS 1.0 05/30/2018 1413   EOSABS 0.1 05/30/2018 1413   BASOSABS 0.0 05/30/2018 1413    BMET    Component Value Date/Time   NA 141 05/31/2018 1148   NA 144 05/19/2018 1612   K 3.4 (L) 05/31/2018 1148   CL 113 (H) 05/31/2018 1148   CO2 23 05/31/2018 1148   GLUCOSE 100 (H) 05/31/2018 1148   BUN 18 05/31/2018 1148   BUN 15 05/19/2018 1612   CREATININE 0.80 05/31/2018 1148   CALCIUM 8.1 (L) 05/31/2018 1148   GFRNONAA >60 05/31/2018 1148   GFRAA >60 05/31/2018 1148    COAGS: Lab Results  Component Value Date   INR 1.1 05/30/2018   INR 1.26 04/24/2018   INR 1.03  02/10/2018     Non-Invasive Vascular Imaging:    CT head without contrast 05/31/2018: No appreciable acute intracranial abnormality.  CTA neck 05/30/2018: On my review approximate 60% right ICA stenosis, left vertebral artery dominant with no significant stenosis in either vertebral artery.  Carotid duplex 05/31/2018: Right ICA velocity 134/39 with concern for 40 to 59% stenosis.   ASSESSMENT/PLAN: This is a 83 y.o. female that presents with concern for symptomatic right carotid stenosis.  Her event occurred on Thursday night/Friday morning with gait ataxia as well as left leg weakness and dysarthria in the setting of a moderate 60% right ICA stenosis.  Unfortunately cannot get an MRI to confirm MCA distribution given that she has a pacemaker.  She had an initial event  back in February with left arm weakness, diplopia, and gait ataxia and was evaluated by Dr. Donnetta Hutching for her moderate right ICA stenosis.  At the time neurology felt it was more consistent with posterior circulation TIA and carotid intervention was not recommended.  Her left sided focal symptoms on both presentations are certainly concerning for symptomatic carotid disease.  Will allow the neurology team to complete their work-up and make Dr. Donnetta Hutching aware of her admission.  Vascular will continue to follow.  Marty Heck, MD [facility] Office: [phone] Pager: [place]

## 2018-05-31 NOTE — Progress Notes (Signed)
VASCULAR LAB PRELIMINARY  PRELIMINARY  PRELIMINARY  PRELIMINARY  Carotid duplex completed.    Preliminary report:  See CV proc for results  Markesia Crilly, RVT 05/31/2018, 9:03 AM

## 2018-05-31 NOTE — Evaluation (Signed)
Speech Language Pathology Evaluation Patient Details Name: Meredith Gonzalez MRN: [id number] DOB: 13-Mar-1936 Today's Date: 05/31/2018 Time: 7253-6644 SLP Time Calculation (min) (ACUTE ONLY): 19 min  Problem List:  Patient Active Problem List   Diagnosis Date Noted  . Stroke ([place]) 05/30/2018  . Chronic diastolic congestive heart failure ([place])   . Anemia of chronic disease   . Hypoalbuminemia due to protein-calorie malnutrition ([place])   . Labile blood pressure   . Atrial fibrillation ([place])   . Chronic neck pain   . Subcortical infarction ([place]) 04/27/2018  . CVA (cerebral vascular accident) ([place]) 04/26/2018  . TIA (transient ischemic attack) 04/24/2018  . CAD (coronary artery disease) 08/07/2016  . (HFpEF) heart failure with preserved ejection fraction ([place]) 08/07/2016  . Ischemic cardiomyopathy 08/07/2016  . Chest pain   . NSTEMI (non-ST elevated myocardial infarction) ([place])   . Status post coronary artery stent placement   . Abnormal stress test   . SOB (shortness of breath)   . Multifocal pneumonia 07/13/2016  . Elevated troponin 07/13/2016  . Leukocytosis   . Pacemaker 05/17/2014  . Complications, pacemaker cardiac, mechanical 12/31/2012  . Edema extremities 10/08/2012  . Persistent atrial fibrillation 10/08/2012  . Hypertension 10/08/2012  . Hypothyroidism 10/08/2012  . Neuropathy 10/08/2012  . Diabetes ([place]) 10/08/2012  . Depression 09/08/2012  . Anemia 08/08/2012  . Knee fracture, left 08/07/2012  . Recurrent falls 08/07/2012  . Syncope 08/07/2012  . SDH (subdural hematoma) ([place]) 08/07/2012   Past Medical History:  Past Medical History:  Diagnosis Date  . Anemia   . Anxiety   . Arthritis    "all over" (02/11/2018)  . Basal cell carcinoma    "left leg" (02/11/2018)  . Cervical spine fracture ([place]) 12/2017   "C1-2"  . CHF (congestive heart failure) ([place])   . Chronic bronchitis ([place])   . Chronic neck pain    "since I broke my neck 6-8 wk ago" (02/11/2018)  .  Diabetic peripheral neuropathy ([place])   . Fibromyalgia   . Fracture of right humerus   . Generalized weakness   . Headache    "weekly" (02/11/2018)  . History of blood transfusion    "related to one of my femur surgeries" (02/11/2018)  . History of echocardiogram    Echo 8/18: EF 50-55, no RWMA, Gr 1 DD, calcified AV leaflets, MAC, trivial MR, mod LAE, PASP 37  . Hypertension   . Hypothyroidism   . Incontinence of urine   . Ischemic stroke ([place]) 2000   "lost part of the vision in my right eye" (02/11/2018)  . Major depression, chronic   . Myocardial infarction ([place]) 06/2016  . Pacemaker   . Recurrent falls   . Syncope and collapse   . Type 2 diabetes, diet controlled ([place])    Past Surgical History:  Past Surgical History:  Procedure Laterality Date  . ABDOMINAL HYSTERECTOMY    . ANKLE FRACTURE SURGERY Right   . BASAL CELL CARCINOMA EXCISION Left    "leg"  . CARDIAC CATHETERIZATION  ~ 2014  . CARDIOVERSION N/A 02/13/2018   Procedure: CARDIOVERSION;  Surgeon: Sueanne Margarita, MD;  Location: [facility] ENDOSCOPY;  Service: Cardiovascular;  Laterality: N/A;  . CARDIOVERSION N/A 03/27/2018   Procedure: CARDIOVERSION;  Surgeon: Lelon Perla, MD;  Location: [facility] ENDOSCOPY;  Service: Cardiovascular;  Laterality: N/A;  . CARPAL TUNNEL RELEASE     bilateral  . CATARACT EXTRACTION W/ INTRAOCULAR LENS  IMPLANT, BILATERAL Bilateral   . CHOLECYSTECTOMY OPEN    . COLONOSCOPY    .  CORONARY ANGIOPLASTY WITH STENT PLACEMENT  06/2016  . CORONARY STENT INTERVENTION N/A 07/19/2016   Procedure: Coronary Stent Intervention;  Surgeon: Peter M Martinique, MD;  Location: [place] CV LAB;  Service: Cardiovascular;  Laterality: N/A;  . EYE SURGERY     right eye catarace/lens implant  . FEMUR FRACTURE SURGERY Bilateral   . FRACTURE SURGERY    . GASTRIC BYPASS    . INSERT / REPLACE / REMOVE PACEMAKER  ~ 2012  . LEAD REVISION N/A 01/01/2013   Procedure: LEAD REVISION;  Surgeon: Evans Lance, MD;  Location:  [facility] CATH LAB;  Service: Cardiovascular;  Laterality: N/A;  . LEFT HEART CATH AND CORONARY ANGIOGRAPHY N/A 07/19/2016   Procedure: Left Heart Cath and Coronary Angiography;  Surgeon: Peter M Martinique, MD;  Location: [place] CV LAB;  Service: Cardiovascular;  Laterality: N/A;  . OVARIAN CYST SURGERY     "one side only"  . TEE WITHOUT CARDIOVERSION N/A 02/13/2018   Procedure: TRANSESOPHAGEAL ECHOCARDIOGRAM (TEE);  Surgeon: Sueanne Margarita, MD;  Location: [facility] ENDOSCOPY;  Service: Cardiovascular;  Laterality: N/A;  . TONSILLECTOMY     HPI:  Pt is an 83 y.o. female with medical history significant of previous CVA, diabetes, hypertension, cervical spinal disease, chronic bronchitis, basal cell carcinoma, status post pacemaker placement, atrial fibrillation and hypertension, hypothyroidism who presented to [facility] with complaint of left sided weakness and dysarthria.  CT of the head was negative for emergent finding or change from January 2020.   Assessment / Plan / Recommendation Clinical Impression  Pt participated in speech/language evaluation and she denied any prior deficits in these areas. She stated that her speech has improved significantly compared to that which was noted upon admission and expressed that it is likely 98% back to baseline. Based on today's assessment, her speech and language skills are currently within normal limits and no cognitive-linguistic deficits were noted. Further skilled SLP services are not clinically indicated at this time. Pt and her nurse were educated regarding this and both parties verbalized understanding as well as agreement with plan of care.    SLP Assessment  SLP Recommendation/Assessment: Patient does not need any further Speech Lanaguage Pathology Services SLP Visit Diagnosis: Dysarthria and anarthria (R47.1)    Follow Up Recommendations  None    Frequency and Duration           SLP Evaluation Cognition  Overall Cognitive Status:  Within Functional Limits for tasks assessed Arousal/Alertness: Awake/alert Orientation Level: Oriented to place;Oriented to person;Oriented to time;Oriented to situation Attention: Focused;Sustained Focused Attention: Appears intact Sustained Attention: Appears intact Memory: Impaired Memory Impairment: Storage deficit;Retrieval deficit(Immediate: 3/3; Delayed: 2/3 ) Decreased Short Term Memory: Verbal complex Awareness: Appears intact Problem Solving: Appears intact Executive Function: Reasoning(2/2)       Comprehension  Auditory Comprehension Overall Auditory Comprehension: Appears within functional limits for tasks assessed Yes/No Questions: Within Functional Limits(SImple: 5/5; Complex: 5/5) Commands: Within Functional Limits(2 step: 4/4; 3-step: 4/4) Conversation: Complex Reading Comprehension Reading Status: Within funtional limits    Expression Expression Primary Mode of Expression: Verbal Verbal Expression Overall Verbal Expression: Appears within functional limits for tasks assessed Initiation: No impairment Automatic Speech: Counting;Day of week;Month of year(WNL) Level of Generative/Spontaneous Verbalization: Conversation;Sentence Repetition: No impairment(5/5) Naming: No impairment Responsive: (4/5) Confrontation: Within functional limits(10/10) Convergent: (Sentence completion: 5/5) Divergent: Not tested Pragmatics: No impairment Written Expression Dominant Hand: Right   Oral / Motor  Oral Motor/Sensory Function Overall Oral Motor/Sensory Function: Within functional limits Motor Speech  Overall Motor Speech: Appears within functional limits for tasks assessed Respiration: Within functional limits Phonation: Normal Resonance: Within functional limits Articulation: Within functional limitis Intelligibility: Intelligible Motor Planning: Witnin functional limits Motor Speech Errors: Not applicable   Yazid Pop I. Hardin Negus, [place], [place] Office number [phone] Pager [place] 05/31/2018, 1:05 PM

## 2018-05-31 NOTE — Progress Notes (Signed)
ANTICOAGULATION CONSULT NOTE - Initial Consult  Pharmacy Consult for Apixaban  Indication: h/o PAF , h/o CVA  Allergies  Allergen Reactions  . Valium [Diazepam] Anxiety    Makes patient hyper  . Codeine Hives and Itching    Can take with Benadryl   . Darifenacin Itching    Can take with Benadryl  . Darvon [Propoxyphene Hcl] Itching    Can take with Benadryl  . Daypro [Oxaprozin] Itching    Can take with Benadryl  . Enablex [Darifenacin Hydrobromide Er] Itching    Can take with Benadryl  . Oxycodone Itching    Can take with Benadryl   . Propoxyphene Itching    Can take with Benadryl  . Risperdal [Risperidone] Itching    Can take with Benadryl  . Talwin [Pentazocine] Itching    Can take with Benadryl  . Vicodin [Hydrocodone-Acetaminophen] Itching    Can take with Benadryl     Patient Measurements: Height: _0  (165.1 cm) Weight: 133 lb 6.1 oz (60.5 kg) IBW/kg (Calculated) : 57   Vital Signs: Temp: 98.2 F (36.8 C) (03/08 0300) Temp Source: Oral (03/08 0300) BP: 122/52 (03/08 0300) Pulse Rate: 68 (03/08 0816)  Labs: Recent Labs    05/30/18 1413 05/31/18 1148  HGB 10.2* 9.1*  HCT 35.1* 31.1*  PLT 234 207  APTT 40*  --   LABPROT 13.6  --   INR 1.1  --   CREATININE 1.19* 0.80    Estimated Creatinine Clearance: 50.5 mL/min (by C-G formula based on SCr of 0.8 mg/dL).   Medical History: Past Medical History:  Diagnosis Date  . Anemia   . Anxiety   . Arthritis    "all over" (02/11/2018)  . Basal cell carcinoma    "left leg" (02/11/2018)  . Cervical spine fracture ([place]) 12/2017   "C1-2"  . CHF (congestive heart failure) ([place])   . Chronic bronchitis ([place])   . Chronic neck pain    "since I broke my neck 6-8 wk ago" (02/11/2018)  . Diabetic peripheral neuropathy ([place])   . Fibromyalgia   . Fracture of right humerus   . Generalized weakness   . Headache    "weekly" (02/11/2018)  . History of blood transfusion    "related to one of my femur  surgeries" (02/11/2018)  . History of echocardiogram    Echo 8/18: EF 50-55, no RWMA, Gr 1 DD, calcified AV leaflets, MAC, trivial MR, mod LAE, PASP 37  . Hypertension   . Hypothyroidism   . Incontinence of urine   . Ischemic stroke ([place]) 2000   "lost part of the vision in my right eye" (02/11/2018)  . Major depression, chronic   . Myocardial infarction ([place]) 06/2016  . Pacemaker   . Recurrent falls   . Syncope and collapse   . Type 2 diabetes, diet controlled ([place])     Medications:  Medications Prior to Admission  Medication Sig Dispense Refill Last Dose  . acetaminophen (TYLENOL) 500 MG tablet Take 500 mg by mouth every 6 (six) hours as needed for moderate pain.    05/30/2018 at noon  . albuterol (PROVENTIL HFA;VENTOLIN HFA) 108 (90 Base) MCG/ACT inhaler Inhale 2 puffs into the lungs every 6 (six) hours as needed for wheezing or shortness of breath. 1 Inhaler 1 few months ago  . amiodarone (PACERONE) 200 MG tablet Take 1 tablet (200 mg total) by mouth every morning. (Patient taking differently: Take 200 mg by mouth daily. ) 90 tablet 3 05/30/2018 at am  .  apixaban (ELIQUIS) 2.5 MG TABS tablet Take 1 tablet (2.5 mg total) by mouth 2 (two) times daily. 60 tablet 1 05/30/2018 at 500  . atorvastatin (LIPITOR) 40 MG tablet Take 1 tablet (40 mg total) by mouth daily. 90 tablet 3 05/30/2018 at am  . budesonide-formoterol (SYMBICORT) 160-4.5 MCG/ACT inhaler Inhale 2 puffs into the lungs 2 (two) times daily. 1 Inhaler 5 05/30/2018 at am  . Calcium Citrate-Vitamin D3 315-250 MG-UNIT TABS Take 1 tablet by mouth 2 (two) times daily.   05/30/2018 at am  . Cholecalciferol (VITAMIN D3 PO) Take 1 tablet by mouth 2 (two) times daily.   05/30/2018 at am  . ferrous sulfate 325 (65 FE) MG tablet Take 1 tablet (325 mg total) by mouth 2 (two) times daily with a meal. 60 tablet 0 05/30/2018 at am  . flintstones complete (FLINTSTONES) 60 MG chewable tablet Chew 1 tablet by mouth daily.   05/30/2018 at am  . fluticasone  (FLONASE) 50 MCG/ACT nasal spray Place 1 spray into both nostrils daily as needed (allergies.).   4 1-2 months ago  . furosemide (LASIX) 20 MG tablet Take 0.5 tablets (10 mg total) by mouth daily. 30 tablet 1 05/30/2018 at am  . gabapentin (NEURONTIN) 300 MG capsule Take 1-2 capsules (300-600 mg total) by mouth See admin instructions. Take 1 capsule (300 mg) by mouth in the mornning & take 2 capsules (600 mg) by mouth at night (Patient taking differently: Take 300-600 mg by mouth See admin instructions. Take 1 capsule (300 mg) by mouth in the morning & take 2 capsules (600 mg) by mouth at night) 90 capsule 0 05/30/2018 at am  . levothyroxine (SYNTHROID, LEVOTHROID) 50 MCG tablet Take 1 tablet (50 mcg total) by mouth daily at 6 (six) AM for 30 days. 30 tablet 0 05/30/2018 at am  . vitamin B-12 (CYANOCOBALAMIN) 1000 MCG tablet Take 1 tablet (1,000 mcg total) by mouth daily. 30 tablet 0 05/30/2018 at am  . budesonide-formoterol (SYMBICORT) 160-4.5 MCG/ACT inhaler Inhale 2 puffs into the lungs 2 (two) times daily for 1 day. (Patient not taking: Reported on 05/30/2018) 1 Inhaler 0 Not Taking at Unknown time  . methocarbamol (ROBAXIN) 500 MG tablet Take 0.5 tablets (250 mg total) by mouth every 8 (eight) hours as needed for muscle spasms. (Patient not taking: Reported on 05/30/2018) 60 tablet 0 Not Taking at Unknown time  . nitroGLYCERIN (NITROSTAT) 0.4 MG SL tablet Place 1 tablet (0.4 mg total) under the tongue every 5 (five) minutes as needed for chest pain. (Patient not taking: Reported on 05/30/2018) 25 tablet 2 Not Taking at Unknown time  . traZODone (DESYREL) 50 MG tablet Take 0.5 tablets (25 mg total) by mouth at bedtime. (Patient not taking: Reported on 05/30/2018) 30 tablet 0 Not Taking at Unknown time    Assessment: 83 y.o female with nonvalvular afib on Eliquis. Pharmacy consulted by Dr. Erlinda Hong  for dosing consideration of  increasing Eliquis dosing from her PTA dose of 2.51m BID as she is really borderline for the  dosing and patient now likely had recurrent stroke.   Age 83  wt 60.5 kg, Scr <1.5 H/o anemia, Hgb 10.2>9.1, ptlc 207 wnl No bleeding noted Discussed with Dr.Xu (neuro) dosing including patient's h/o anemia noted , recurrent falls.  He agrees to increase Eliquis dose to 5 mg bid with close monitoring of CBC.     Goal of Therapy:  Monitor platelets by anticoagulation protocol: Yes   Plan:  Increase Eliquis to 5 mg  BID, start with tonight's dose.  Monitor CBC and monitor for s/sx of bleeding.  Nicole Cella, RPh Clinical Pharmacist [phone] Please check AMION for all [place] phone numbers After 10:00 PM, call [place] [phone] 05/31/2018,1:57 PM

## 2018-05-31 NOTE — Progress Notes (Signed)
Rehab Admissions Coordinator Note:  Patient was screened by Cleatrice Burke for appropriateness for an Inpatient Acute Rehab Consult per PT recs. If pt found to have CVA, would then recommend an inpt rehab consult.Cleatrice Burke RN MSN 05/31/2018, 2:41 PM  I can be reached at [phone].

## 2018-05-31 NOTE — Evaluation (Signed)
Physical Therapy Evaluation Patient Details Name: Meredith Gonzalez MRN: [id number] DOB: 1935-06-21 Today's Date: 05/31/2018   History of Present Illness  Pt admitted 05/30/2018 for suspected stroke but has not had MRI yet to confirm. PMH significant for recent stroke, DM, HTN, pacemaker, CHF, cervical fracture, femur fracture, recurrent falls.   Clinical Impression  Pt admitted with above diagnosis. Pt currently with functional limitations due to the deficits listed below (see PT Problem List).  Pt will benefit from skilled PT to increase their independence and safety with mobility to allow discharge to the venue listed below.  Pt ultimately wants to return home again and at this point does not have the mobility to safely do so.     Follow Up Recommendations CIR;Supervision for mobility/OOB    Equipment Recommendations  None recommended by PT    Recommendations for Other Services       Precautions / Restrictions Precautions Precautions: Fall Restrictions Weight Bearing Restrictions: No      Mobility  Bed Mobility Overal bed mobility: Needs Assistance Bed Mobility: Supine to Sit     Supine to sit: Supervision;HOB elevated        Transfers Overall transfer level: Needs assistance Equipment used: None Transfers: Sit to/from [facility] Sit to Stand: Min assist Stand pivot transfers: Min assist(pt transferred to [facility])          Ambulation/Gait Ambulation/Gait assistance: Mod assist Gait Distance (Feet): 12 Feet Assistive device: 1 person hand held assist(pt held to PT's arms as I walked in front of her) Gait Pattern/deviations: Step-to pattern;Decreased stride length     General Gait Details: Had one large loss of balnce forwards that required significant assist of the PT to recover  Stairs            Wheelchair Mobility    Modified Rankin (Stroke Patients Only) Modified Rankin (Stroke Patients Only) Pre-Morbid Rankin Score: Slight  disability Modified Rankin: Moderately severe disability     Balance                                             Pertinent Vitals/Pain Pain Assessment: 0-10 Faces Pain Scale: No hurt Pain Location: neck Pain Intervention(s): Limited activity within patient's tolerance;Monitored during session;Other (comment)(pt c/o neck pain but did not rate)    Home Living Family/patient expects to be discharged to:: Unsure Living Arrangements: Alone Available Help at Discharge: Family;Available PRN/intermittently Type of Home: House([facility]) Home Access: Level entry     Home Layout: One level Home Equipment: [place] - 4 wheels;Walker - 2 wheels;Bedside commode;Shower seat - built in;Grab bars - tub/shower Additional Comments: living in "[facility]"    Prior Function Level of Independence: Independent with assistive device(s)         Comments: (was receiving PT at the home proir to admission )     Hand Dominance   Dominant Hand: Right    Extremity/Trunk Assessment   Upper Extremity Assessment Upper Extremity Assessment: Defer to OT evaluation    Lower Extremity Assessment Lower Extremity Assessment: LLE deficits/detail LLE Deficits / Details: decreased functional stregth througout LLE       Communication   Communication: No difficulties  Cognition Arousal/Alertness: Awake/alert Behavior During Therapy: WFL for tasks assessed/performed Overall Cognitive Status: Within Functional Limits for tasks assessed  General Comments General comments (skin integrity, edema, etc.): no family present    Exercises     Assessment/Plan    PT Assessment Patient needs continued PT services  PT Problem List Decreased strength;Decreased mobility;Decreased coordination;Decreased balance;Decreased knowledge of use of DME;Decreased knowledge of precautions       PT Treatment  Interventions DME instruction;Functional mobility training;Balance training;Patient/family education;Gait training;Therapeutic activities;Neuromuscular re-education    PT Goals (Current goals can be found in the Care Plan section)  Acute Rehab PT Goals Patient Stated Goal: to walk again PT Goal Formulation: With patient Time For Goal Achievement: 06/07/18 Potential to Achieve Goals: Good    Frequency Min 4X/week   Barriers to discharge        Co-evaluation               AM-PAC PT "6 Clicks" Mobility  Outcome Measure Help needed turning from your back to your side while in a flat bed without using bedrails?: A Little Help needed moving from lying on your back to sitting on the side of a flat bed without using bedrails?: A Little Help needed moving to and from a bed to a chair (including a wheelchair)?: A Little Help needed standing up from a chair using your arms (e.g., wheelchair or bedside chair)?: A Little Help needed to walk in hospital room?: A Lot Help needed climbing 3-5 steps with a railing? : Total 6 Click Score: 15    End of Session Equipment Utilized During Treatment: Gait belt Activity Tolerance: Patient tolerated treatment well Patient left: in chair;with call bell/phone within reach;with chair alarm set Nurse Communication: Mobility status PT Visit Diagnosis: Hemiplegia and hemiparesis Hemiplegia - Right/Left: Left Hemiplegia - dominant/non-dominant: Non-dominant Hemiplegia - caused by: Cerebral infarction    Time: 9728-2060 PT Time Calculation (min) (ACUTE ONLY): 28 min   Charges:   PT Evaluation $PT Eval Moderate Complexity: 1 Mod PT Treatments $Gait Training: 8-22 mins        Lavonia Dana, PT   Acute Rehabilitation Services  Pager [phone] Office [phone] 05/31/2018   Melvern Banker 05/31/2018, 1:11 PM

## 2018-05-31 NOTE — Progress Notes (Signed)
  Echocardiogram 2D Echocardiogram has been performed.  Jennette Dubin 05/31/2018, 9:07 AM

## 2018-06-01 ENCOUNTER — Encounter (HOSPITAL_COMMUNITY): Payer: Self-pay | Admitting: Physical Medicine and Rehabilitation

## 2018-06-01 DIAGNOSIS — Z950 Presence of cardiac pacemaker: Secondary | ICD-10-CM

## 2018-06-01 DIAGNOSIS — I10 Essential (primary) hypertension: Secondary | ICD-10-CM

## 2018-06-01 DIAGNOSIS — I2510 Atherosclerotic heart disease of native coronary artery without angina pectoris: Secondary | ICD-10-CM

## 2018-06-01 DIAGNOSIS — E1140 Type 2 diabetes mellitus with diabetic neuropathy, unspecified: Secondary | ICD-10-CM

## 2018-06-01 DIAGNOSIS — D62 Acute posthemorrhagic anemia: Secondary | ICD-10-CM

## 2018-06-01 DIAGNOSIS — R0600 Dyspnea, unspecified: Secondary | ICD-10-CM

## 2018-06-01 DIAGNOSIS — I63231 Cerebral infarction due to unspecified occlusion or stenosis of right carotid arteries: Secondary | ICD-10-CM

## 2018-06-01 DIAGNOSIS — I639 Cerebral infarction, unspecified: Principal | ICD-10-CM

## 2018-06-01 DIAGNOSIS — R2990 Unspecified symptoms and signs involving the nervous system: Secondary | ICD-10-CM

## 2018-06-01 LAB — CBC
HCT: 29.9 % — ABNORMAL LOW (ref 36.0–46.0)
Hemoglobin: 8.7 g/dL — ABNORMAL LOW (ref 12.0–15.0)
MCH: 29.1 pg (ref 26.0–34.0)
MCHC: 29.1 g/dL — ABNORMAL LOW (ref 30.0–36.0)
MCV: 100 fL (ref 80.0–100.0)
NRBC: 0 % (ref 0.0–0.2)
Platelets: 200 10*3/uL (ref 150–400)
RBC: 2.99 MIL/uL — AB (ref 3.87–5.11)
RDW: 20.3 % — ABNORMAL HIGH (ref 11.5–15.5)
WBC: 6.3 10*3/uL (ref 4.0–10.5)

## 2018-06-01 LAB — BASIC METABOLIC PANEL
ANION GAP: 4 — AB (ref 5–15)
BUN: 21 mg/dL (ref 8–23)
CO2: 24 mmol/L (ref 22–32)
Calcium: 7.9 mg/dL — ABNORMAL LOW (ref 8.9–10.3)
Chloride: 112 mmol/L — ABNORMAL HIGH (ref 98–111)
Creatinine, Ser: 0.64 mg/dL (ref 0.44–1.00)
GFR calc Af Amer: >60 mL/min (ref >60)
GFR calc non Af Amer: >60 mL/min (ref >60)
Glucose, Bld: 122 mg/dL — ABNORMAL HIGH (ref 70–99)
Potassium: 3.8 mmol/L (ref 3.5–5.1)
Sodium: 140 mmol/L (ref 135–145)

## 2018-06-01 LAB — APTT: aPTT: 36 seconds (ref 24–36)

## 2018-06-01 LAB — HEPARIN LEVEL (UNFRACTIONATED): Heparin Unfractionated: 0.72 IU/mL — ABNORMAL HIGH (ref 0.30–0.70)

## 2018-06-01 MED ORDER — HEPARIN (PORCINE) 25000 UT/250ML-% IV SOLN
1100.0000 [IU]/h | INTRAVENOUS | Status: DC
  Administered 2018-06-01: 850 [IU]/h via INTRAVENOUS
  Administered 2018-06-02: 1200 [IU]/h via INTRAVENOUS
  Filled 2018-06-01 (×2): qty 250

## 2018-06-01 NOTE — Progress Notes (Signed)
IP rehab admissions - I met with patient briefly today.  Noted plans for R CEA on Wed or Thurs.  I will follow progress for now.  Please see rehab consult done by Dr. Posey Pronto today.  Call me for questions.  [phone]

## 2018-06-01 NOTE — Progress Notes (Signed)
ANTICOAGULATION CONSULT NOTE - Initial Consult  Pharmacy Consult for Heparin Indication: atrial fibrillation with new CVA   Allergies  Allergen Reactions  . Valium [Diazepam] Anxiety    Makes patient hyper  . Codeine Hives and Itching    Can take with Benadryl   . Darifenacin Itching    Can take with Benadryl  . Darvon [Propoxyphene Hcl] Itching    Can take with Benadryl  . Daypro [Oxaprozin] Itching    Can take with Benadryl  . Enablex [Darifenacin Hydrobromide Er] Itching    Can take with Benadryl  . Oxycodone Itching    Can take with Benadryl   . Propoxyphene Itching    Can take with Benadryl  . Risperdal [Risperidone] Itching    Can take with Benadryl  . Talwin [Pentazocine] Itching    Can take with Benadryl  . Vicodin [Hydrocodone-Acetaminophen] Itching    Can take with Benadryl     Patient Measurements: Height: _0  (165.1 cm) Weight: 133 lb 6.1 oz (60.5 kg) IBW/kg (Calculated) : 57 Heparin Dosing Weight: 60.5 kg  Vital Signs: Temp: 97.9 F (36.6 C) (03/09 1239) Temp Source: Oral (03/09 1239) BP: 136/55 (03/09 1239) Pulse Rate: 70 (03/09 1239)  Labs: Recent Labs    05/30/18 1413 05/31/18 1148 06/01/18 0458  HGB 10.2* 9.1* 8.7*  HCT 35.1* 31.1* 29.9*  PLT 234 207 200  APTT 40*  --   --   LABPROT 13.6  --   --   INR 1.1  --   --   CREATININE 1.19* 0.80 0.64    Estimated Creatinine Clearance: 50.5 mL/min (by C-G formula based on SCr of 0.64 mg/dL).   Medical History: Past Medical History:  Diagnosis Date  . Anemia   . Anxiety   . Arthritis    "all over" (02/11/2018)  . Basal cell carcinoma    "left leg" (02/11/2018)  . Cervical spine fracture ([place]) 12/2017   "C1-2"  . CHF (congestive heart failure) ([place])   . Chronic bronchitis ([place])   . Chronic neck pain    "since I broke my neck 6-8 wk ago" (02/11/2018)  . Diabetic peripheral neuropathy ([place])   . Fibromyalgia   . Fracture of right humerus   . Generalized weakness   . Headache     "weekly" (02/11/2018)  . History of blood transfusion    "related to one of my femur surgeries" (02/11/2018)  . History of echocardiogram    Echo 8/18: EF 50-55, no RWMA, Gr 1 DD, calcified AV leaflets, MAC, trivial MR, mod LAE, PASP 37  . Hypertension   . Hypothyroidism   . Incontinence of urine   . Ischemic stroke ([place]) 2000   "lost part of the vision in my right eye" (02/11/2018)  . Major depression, chronic   . Myocardial infarction ([place]) 06/2016  . Pacemaker   . Recurrent falls   . Syncope and collapse   . Type 2 diabetes, diet controlled ([facility])     Assessment: CC/HPI: 83 y.o presenting with unsteadiness, increased slurred speech, and double vision. She did not receive IV t-PA due to late presentation  PMH: previous stroke, atrial fibrillation on Eliquis, syncope, permanent pacemaker, coronary artery disease with previous MI, hypothyroidism, hypertension, congestive heart failure, and diabetes mellitus   Anticoag:  h/o nonvalvular afib on Eliquis PTA (2.3m BID). CHADS2VASC=9. age 83  wt 60.5 kg, Scr <1.5 anemia, Hgb 10.2>9.1>8.7, ptlc 200 wnl No bleeding noted Rx previously Discussed with Dr.Xu (neuro)  he said to increase  Eliquis to 5 mg bid if patient not high risk of bleed.  H/o recurrent falls, SDH (07/2012) , anemia.   - 3/9: Tentative plan for R CEA Wednesday with Dr. Donnetta Hutching or Thursday with me Carlis Abbott).  Please hold Eliquis starting this morning - heparin bridge if needed ok--per vascular 3/9.  Goal of Therapy:  Heparin level 0.3-0.5 units/ml Monitor platelets by anticoagulation protocol: Yes   Plan:  Hold Eliquis Start IV heparin (no bolus) at 850 units/hr Check heparin level in 6 hrs. Daily HL and CBC  Seena Face S. Alford Highland, PharmD, [place] Clinical Staff Pharmacist Eilene Ghazi [place] 06/01/2018,1:59 PM

## 2018-06-01 NOTE — Evaluation (Signed)
Occupational Therapy Evaluation Patient Details Name: Meredith Gonzalez MRN: [id number] DOB: 1935-06-07 Today's Date: 06/01/2018    History of Present Illness Pt admitted 05/30/2018 for suspected stroke but has not had MRI yet to confirm. PMH significant for recent stroke, DM, HTN, pacemaker, CHF, cervical fracture, femur fracture, recurrent falls.    Clinical Impression   Pt admitted with above dx and presenting to OT with residual L UE/LE weakness/coordinatino deficits, balance deficits, and reduced ability to perform ADLs with PLOF. Pt has had frequent falls in the home in the last year and is at significant risk for more. Pt would benefit from continued skilled OT services and CIR stay. Pt currently requiring min A for functional mobility and mod A for LB ADLs. OT will continue to follow acutely.     Follow Up Recommendations  CIR;Supervision/Assistance - 24 hour    Equipment Recommendations  None recommended by OT    Recommendations for Other Services Rehab consult;PT consult     Precautions / Restrictions Precautions Precautions: Fall Precaution Comments: has fallen a lot with injuries over past 3 years recent in Sept broke her neck Restrictions Weight Bearing Restrictions: No      Mobility Bed Mobility Overal bed mobility: Needs Assistance Bed Mobility: Supine to Sit     Supine to sit: Supervision;HOB elevated Sit to supine: Min guard   General bed mobility comments: Min Guard A for safety  Transfers Overall transfer level: Needs assistance Equipment used: Rolling walker (2 wheeled) Transfers: Sit to/from [employer] to Stand: Min guard Stand pivot transfers: Min assist       General transfer comment: Min A for steadying in standing    Balance Overall balance assessment: Needs assistance;History of Falls Sitting-balance support: Feet supported Sitting balance-Leahy Scale: Fair   Postural control: Posterior lean;Right lateral lean(no LOB, but  lean present) Standing balance support: Bilateral upper extremity supported Standing balance-Leahy Scale: Poor Standing balance comment: UE support                           ADL either performed or assessed with clinical judgement   ADL Overall ADL's : Needs assistance/impaired Eating/Feeding: Set up;Sitting   Grooming: Supervision/safety;Sitting Grooming Details (indicate cue type and reason): min guard for balance when standing, set up assistance when sitting Upper Body Bathing: Supervision/ safety;Sitting   Lower Body Bathing: Minimal assistance;Sit to/from stand Lower Body Bathing Details (indicate cue type and reason): pt uses LH sponge at home Upper Body Dressing : Supervision/safety;Sitting   Lower Body Dressing: Moderate assistance;Sit to/from stand Lower Body Dressing Details (indicate cue type and reason): pt uses reacher and other AD at home, unable to reach feet Toilet Transfer: Minimal assistance;RW;Regular Toilet   Toileting- Clothing Manipulation and Hygiene: Min guard;Sit to/from stand       Functional mobility during ADLs: Minimal assistance;Rolling walker General ADL Comments: Pt limited by generalized weakness and balance deficits     Vision Baseline Vision/History: Wears glasses Wears Glasses: Reading only Patient Visual Report: No change from baseline Vision Assessment?: No apparent visual deficits     Perception Perception Perception Tested?: Yes Spatial deficits: overall WFL   Praxis Praxis Praxis tested?: Within functional limits    Pertinent Vitals/Pain Pain Assessment: 0-10 Pain Score: 4  Pain Location: Tailbone Pain Descriptors / Indicators: Aching Pain Intervention(s): Monitored during session     Hand Dominance Right   Extremity/Trunk Assessment Upper Extremity Assessment Upper Extremity Assessment: Generalized weakness;LUE deficits/detail LUE Deficits /  Details: Strength 3+/5 in shoulder, elbow WFL. Decreased  coordination but functional LUE Sensation: decreased light touch LUE Coordination: decreased fine motor   Lower Extremity Assessment Lower Extremity Assessment: Defer to PT evaluation   Cervical / Trunk Assessment Cervical / Trunk Assessment: Other exceptions Cervical / Trunk Exceptions: Dens/C1 fx and associated pain   Communication Communication Communication: No difficulties   Cognition Arousal/Alertness: Awake/alert Behavior During Therapy: WFL for tasks assessed/performed Overall Cognitive Status: Within Functional Limits for tasks assessed Area of Impairment: Safety/judgement;Problem solving                   Current Attention Level: Selective     Safety/Judgement: Decreased awareness of deficits;Decreased awareness of safety   Problem Solving: Slow processing General Comments: Increased processing time   General Comments  no family present            [place] expects to be discharged to:: Private residence Living Arrangements: Alone Available Help at Discharge: Family;Available PRN/intermittently Type of Home: House Home Access: Level entry     Home Layout: One level     Bathroom Shower/Tub: Walk-in shower(no threshold)   Bathroom Toilet: Standard Bathroom Accessibility: Yes How Accessible: Accessible via walker;Accessible via wheelchair Home Equipment: Gilford Rile - 4 wheels;Walker - 2 wheels;Bedside commode;Shower seat - built in;Grab bars - tub/shower   Additional Comments: [facility] however son is making arrangements for her to potentially live in basement MIL suite  Lives With: Alone    Prior Functioning/Environment Level of Independence: Independent with assistive device(s)        Comments: used cane to help with balance till got to shopping cart when grocery shopping; rollator in the home        OT Problem List: Decreased strength;Decreased range of motion;Decreased activity tolerance;Impaired  balance (sitting and/or standing);Impaired vision/perception;Decreased coordination;Decreased cognition;Decreased safety awareness;Decreased knowledge of use of DME or AE;Decreased knowledge of precautions;Impaired UE functional use;Pain;Impaired sensation      OT Treatment/Interventions: Self-care/ADL training;Therapeutic exercise;Energy conservation;DME and/or AE instruction;Therapeutic activities;Patient/family education;Neuromuscular education;Visual/perceptual remediation/compensation;Balance training    OT Goals(Current goals can be found in the care plan section) Acute Rehab OT Goals Patient Stated Goal: "go back home" OT Goal Formulation: With patient Time For Goal Achievement: 06/11/18 Potential to Achieve Goals: Good  OT Frequency: Min 3X/week   Barriers to D/C: Decreased caregiver support  Pt lives alone at [employer] "[employer]" Daily Activity     Outcome Measure Help from another person eating meals?: None Help from another person taking care of personal grooming?: A Little Help from another person toileting, which includes using toliet, bedpan, or urinal?: A Little Help from another person bathing (including washing, rinsing, drying)?: A Little Help from another person to put on and taking off regular upper body clothing?: A Little Help from another person to put on and taking off regular lower body clothing?: A Lot 6 Click Score: 18   End of Session Equipment Utilized During Treatment: Gait belt;Rolling walker Nurse Communication: Mobility status  Activity Tolerance: Patient tolerated treatment well Patient left: with call bell/phone within reach;in bed;with bed alarm set  OT Visit Diagnosis: Unsteadiness on feet (R26.81);Other abnormalities of gait and mobility (R26.89);Muscle weakness (generalized) (M62.81);History of falling (Z91.81);Pain;Hemiplegia and hemiparesis Hemiplegia - Right/Left: Left Hemiplegia - dominant/non-dominant:  Non-Dominant Hemiplegia - caused by: Unspecified Pain - part of body: (tailbone)  Time: 0301-3143 OT Time Calculation (min): 28 min Charges:  OT General Charges $OT Visit: 1 Visit OT Evaluation $OT Eval Low Complexity: 1 Low OT Treatments $Self Care/Home Management : 8-22 mins  Curtis Sites OTR/L  06/01/2018, 9:27 AM

## 2018-06-01 NOTE — Consult Note (Signed)
Physical Medicine and Rehabilitation Consult    Reason for Consult: Stroke with functional decline.  Referring Physician: Dr. Algis Liming   HPI: Meredith Gonzalez is a 83 y.o. RH- female with history of CHF, HTN, cervical stenosis with chronic pain, T2DM, A fib, PPM, Dyspnea--likely due to COPD, recent brainstem stroke with recent CIR stay (d/c 05/07/18 at CGA/S level) who was readmitted on 05/30/2018 with fall backwards day PTA, worsening of dizziness, left sided weakness and speech difficulty.  History taken from chart review and patient.  CTA negative. CTA head/neck done revealing no emergent findings and up to 60% stenosis in R-ICA bulb. Unable to do MRI due to PPM. Follow up CT head negative for acute changes. 2D echo showed EF 60-65% with no wall abnormality. Carotid duplex revealed R- 40-59% ICA stenosis. Dr. Erlinda Hong felt that stroke embolic likely due to A fib as patient with recurrent similar symptoms. Eliquis increased to 5 mg bid and  Dr. Carlis Abbott consulted for follow up. He recommended CEA later this week.   Speech back to baseline and no dysphagia noted. Therapy ongoing and CIR recommended for follow up therapy.    Review of Systems  Constitutional: Negative for chills and fever.  HENT: Positive for hearing loss.   Eyes: Positive for blurred vision. Negative for double vision.       Vision changes since last stroke "overlapping"--double?  Respiratory: Positive for shortness of breath.   Cardiovascular: Negative for chest pain and palpitations.  Gastrointestinal: Negative for constipation and nausea.  Genitourinary: Negative for dysuria and urgency.  Musculoskeletal: Positive for neck pain.       Sacral pain  Neurological: Positive for dizziness, focal weakness and weakness.  Psychiatric/Behavioral: Negative for memory loss.  All other systems reviewed and are negative.    Past Medical History:  Diagnosis Date  . Anemia   . Anxiety   . Arthritis    "all over" (02/11/2018)  . Basal  cell carcinoma    "left leg" (02/11/2018)  . Cervical spine fracture ([place]) 12/2017   "C1-2"  . CHF (congestive heart failure) ([place])   . Chronic bronchitis ([place])   . Chronic neck pain    "since I broke my neck 6-8 wk ago" (02/11/2018)  . Diabetic peripheral neuropathy ([place])   . Fibromyalgia   . Fracture of right humerus   . Generalized weakness   . Headache    "weekly" (02/11/2018)  . History of blood transfusion    "related to one of my femur surgeries" (02/11/2018)  . History of echocardiogram    Echo 8/18: EF 50-55, no RWMA, Gr 1 DD, calcified AV leaflets, MAC, trivial MR, mod LAE, PASP 37  . Hypertension   . Hypothyroidism   . Incontinence of urine   . Ischemic stroke ([place]) 2000   "lost part of the vision in my right eye" (02/11/2018)  . Major depression, chronic   . Myocardial infarction ([place]) 06/2016  . Pacemaker   . Recurrent falls   . Syncope and collapse   . Type 2 diabetes, diet controlled ([place])     Past Surgical History:  Procedure Laterality Date  . ABDOMINAL HYSTERECTOMY    . ANKLE FRACTURE SURGERY Right   . BASAL CELL CARCINOMA EXCISION Left    "leg"  . CARDIAC CATHETERIZATION  ~ 2014  . CARDIOVERSION N/A 02/13/2018   Procedure: CARDIOVERSION;  Surgeon: Sueanne Margarita, MD;  Location: [facility] ENDOSCOPY;  Service: Cardiovascular;  Laterality: N/A;  . CARDIOVERSION N/A 03/27/2018  Procedure: CARDIOVERSION;  Surgeon: Lelon Perla, MD;  Location: [facility] ENDOSCOPY;  Service: Cardiovascular;  Laterality: N/A;  . CARPAL TUNNEL RELEASE     bilateral  . CATARACT EXTRACTION W/ INTRAOCULAR LENS  IMPLANT, BILATERAL Bilateral   . CHOLECYSTECTOMY OPEN    . COLONOSCOPY    . CORONARY ANGIOPLASTY WITH STENT PLACEMENT  06/2016  . CORONARY STENT INTERVENTION N/A 07/19/2016   Procedure: Coronary Stent Intervention;  Surgeon: Peter M Martinique, MD;  Location: [place] CV LAB;  Service: Cardiovascular;  Laterality: N/A;  . EYE SURGERY     right eye catarace/lens implant  . FEMUR  FRACTURE SURGERY Bilateral   . FRACTURE SURGERY    . GASTRIC BYPASS    . INSERT / REPLACE / REMOVE PACEMAKER  ~ 2012  . LEAD REVISION N/A 01/01/2013   Procedure: LEAD REVISION;  Surgeon: Evans Lance, MD;  Location: [facility] CATH LAB;  Service: Cardiovascular;  Laterality: N/A;  . LEFT HEART CATH AND CORONARY ANGIOGRAPHY N/A 07/19/2016   Procedure: Left Heart Cath and Coronary Angiography;  Surgeon: Peter M Martinique, MD;  Location: [place] CV LAB;  Service: Cardiovascular;  Laterality: N/A;  . OVARIAN CYST SURGERY     "one side only"  . TEE WITHOUT CARDIOVERSION N/A 02/13/2018   Procedure: TRANSESOPHAGEAL ECHOCARDIOGRAM (TEE);  Surgeon: Sueanne Margarita, MD;  Location: [facility] ENDOSCOPY;  Service: Cardiovascular;  Laterality: N/A;  . TONSILLECTOMY      Family History  Problem Relation Age of Onset  . Congestive Heart Failure Mother   . Heart attack Father   . Alcoholism Father   . Alcoholism Sister   . Alcoholism Brother   . Alcoholism Brother   . Throat cancer Brother     Social History:  Lives alone in [place] facility.   Retired Pharmacist, hospital. Son helps with home management 2-3X week.  HHPT has been ongoing. OT signed off. She smoked 1/2 PPD for 6 years. She reports that she quit smoking about 45 years ago. Her smoking use included cigarettes. She has a 2.00 pack-year smoking history. She has never used smokeless tobacco. She reports rare alcohol use. She reports that she does not use drugs.    Allergies  Allergen Reactions  . Valium [Diazepam] Anxiety    Makes patient hyper  . Codeine Hives and Itching    Can take with Benadryl   . Darifenacin Itching    Can take with Benadryl  . Darvon [Propoxyphene Hcl] Itching    Can take with Benadryl  . Daypro [Oxaprozin] Itching    Can take with Benadryl  . Enablex [Darifenacin Hydrobromide Er] Itching    Can take with Benadryl  . Oxycodone Itching    Can take with Benadryl   . Propoxyphene Itching    Can take with Benadryl  . Risperdal  [Risperidone] Itching    Can take with Benadryl  . Talwin [Pentazocine] Itching    Can take with Benadryl  . Vicodin [Hydrocodone-Acetaminophen] Itching    Can take with Benadryl     Medications Prior to Admission  Medication Sig Dispense Refill  . acetaminophen (TYLENOL) 500 MG tablet Take 500 mg by mouth every 6 (six) hours as needed for moderate pain.     Marland Kitchen albuterol (PROVENTIL HFA;VENTOLIN HFA) 108 (90 Base) MCG/ACT inhaler Inhale 2 puffs into the lungs every 6 (six) hours as needed for wheezing or shortness of breath. 1 Inhaler 1  . amiodarone (PACERONE) 200 MG tablet Take 1 tablet (200 mg total) by mouth every  morning. (Patient taking differently: Take 200 mg by mouth daily. ) 90 tablet 3  . apixaban (ELIQUIS) 2.5 MG TABS tablet Take 1 tablet (2.5 mg total) by mouth 2 (two) times daily. 60 tablet 1  . atorvastatin (LIPITOR) 40 MG tablet Take 1 tablet (40 mg total) by mouth daily. 90 tablet 3  . budesonide-formoterol (SYMBICORT) 160-4.5 MCG/ACT inhaler Inhale 2 puffs into the lungs 2 (two) times daily. 1 Inhaler 5  . Calcium Citrate-Vitamin D3 315-250 MG-UNIT TABS Take 1 tablet by mouth 2 (two) times daily.    . Cholecalciferol (VITAMIN D3 PO) Take 1 tablet by mouth 2 (two) times daily.    . ferrous sulfate 325 (65 FE) MG tablet Take 1 tablet (325 mg total) by mouth 2 (two) times daily with a meal. 60 tablet 0  . flintstones complete (FLINTSTONES) 60 MG chewable tablet Chew 1 tablet by mouth daily.    . fluticasone (FLONASE) 50 MCG/ACT nasal spray Place 1 spray into both nostrils daily as needed (allergies.).   4  . furosemide (LASIX) 20 MG tablet Take 0.5 tablets (10 mg total) by mouth daily. 30 tablet 1  . gabapentin (NEURONTIN) 300 MG capsule Take 1-2 capsules (300-600 mg total) by mouth See admin instructions. Take 1 capsule (300 mg) by mouth in the mornning & take 2 capsules (600 mg) by mouth at night (Patient taking differently: Take 300-600 mg by mouth See admin instructions. Take  1 capsule (300 mg) by mouth in the morning & take 2 capsules (600 mg) by mouth at night) 90 capsule 0  . levothyroxine (SYNTHROID, LEVOTHROID) 50 MCG tablet Take 1 tablet (50 mcg total) by mouth daily at 6 (six) AM for 30 days. 30 tablet 0  . vitamin B-12 (CYANOCOBALAMIN) 1000 MCG tablet Take 1 tablet (1,000 mcg total) by mouth daily. 30 tablet 0  . budesonide-formoterol (SYMBICORT) 160-4.5 MCG/ACT inhaler Inhale 2 puffs into the lungs 2 (two) times daily for 1 day. (Patient not taking: Reported on 05/30/2018) 1 Inhaler 0  . methocarbamol (ROBAXIN) 500 MG tablet Take 0.5 tablets (250 mg total) by mouth every 8 (eight) hours as needed for muscle spasms. (Patient not taking: Reported on 05/30/2018) 60 tablet 0  . nitroGLYCERIN (NITROSTAT) 0.4 MG SL tablet Place 1 tablet (0.4 mg total) under the tongue every 5 (five) minutes as needed for chest pain. (Patient not taking: Reported on 05/30/2018) 25 tablet 2  . traZODone (DESYREL) 50 MG tablet Take 0.5 tablets (25 mg total) by mouth at bedtime. (Patient not taking: Reported on 05/30/2018) 30 tablet 0    Home: Home Living Family/patient expects to be discharged to:: Unsure Living Arrangements: Alone Available Help at Discharge: Family, Available PRN/intermittently Type of Home: House([facility] homes at a facilty) Home Access: Level entry Home Layout: One level Bathroom Shower/Tub: Multimedia programmer: Standard Bathroom Accessibility: Yes Home Equipment: Environmental consultant - 4 wheels, Environmental consultant - 2 wheels, Bedside commode, Shower seat - built in, [employer] - tub/shower Additional Comments: living in "cluster retirement home"  Lives With: Alone  Functional History: Prior Function Level of Independence: Independent with assistive device(s) Comments: (was receiving PT at the home proir to admission ) Functional Status:  Mobility: Bed Mobility Overal bed mobility: Needs Assistance Bed Mobility: Supine to Sit Supine to sit: Supervision, HOB  elevated Transfers Overall transfer level: Needs assistance Equipment used: None Transfers: Sit to/from Stand, [employer] Transfers Sit to Stand: Min assist Stand pivot transfers: Min assist(pt transferred to [facility]) Ambulation/Gait Ambulation/Gait assistance: Mod assist  Gait Distance (Feet): 12 Feet Assistive device: 1 person hand held assist(pt held to PT's arms as I walked in front of her) Gait Pattern/deviations: Step-to pattern, Decreased stride length General Gait Details: Had one large loss of balnce forwards that required significant assist of the PT to recover    ADL:    Cognition: Cognition Overall Cognitive Status: Within Functional Limits for tasks assessed Arousal/Alertness: Awake/alert Orientation Level: Oriented X4 Attention: Focused, Sustained Focused Attention: Appears intact Sustained Attention: Appears intact Memory: Impaired Memory Impairment: Storage deficit, Retrieval deficit(Immediate: 3/3; Delayed: 2/3 ) Decreased Short Term Memory: Verbal complex Awareness: Appears intact Problem Solving: Appears intact Executive Function: Reasoning(2/2) Cognition Arousal/Alertness: Awake/alert Behavior During Therapy: WFL for tasks assessed/performed Overall Cognitive Status: Within Functional Limits for tasks assessed   Blood pressure (!) 123/59, pulse 74, temperature 98.2 F (36.8 C), temperature source Oral, resp. rate 15, height 5' 5"  (1.651 m), weight 60.5 kg, SpO2 97 %. Physical Exam  Nursing note and vitals reviewed. Constitutional: She is oriented to person, place, and time. She appears well-developed and well-nourished.  HENT:  Head: Normocephalic and atraumatic.  Eyes: EOM are normal. Right eye exhibits no discharge. Left eye exhibits no discharge.  Neck: Normal range of motion. Neck supple.  Cardiovascular: Normal rate and regular rhythm.  Respiratory: Effort normal and breath sounds normal. No stridor.  GI: Soft. Bowel sounds are normal.   Musculoskeletal:     Comments: No edema or tenderness in extremities  Neurological: She is alert and oriented to person, place, and time.  Hoarse voice. Able to follow basic 2 step commands without difficulty.  Head tremors noted.   Motor: Bilateral upper extremities: 5/5 proximal distal Left upper extremity with dysmetria Right lower extremity: Hip flexion 4-/5, knee extension 4-/5, ankle dorsiflexion 4/5 Left lower extremity: Hip flexion 3+/5, knee extension 2+/5, ankle dorsiflexion 4-/5  Skin:  Stasis changes BLE with fine macular rash on shins.   Psychiatric: She has a normal mood and affect. Her behavior is normal. Thought content normal.    Results for orders placed or performed during the hospital encounter of 05/30/18 (from the past 24 hour(s))  Hemoglobin A1c     Status: Abnormal   Collection Time: 05/31/18 11:48 AM  Result Value Ref Range   Hgb A1c MFr Bld 5.7 (H) 4.8 - 5.6 %   Mean Plasma Glucose 116.89 mg/dL  CBC     Status: Abnormal   Collection Time: 05/31/18 11:48 AM  Result Value Ref Range   WBC 6.5 4.0 - 10.5 K/uL   RBC 3.12 (L) 3.87 - 5.11 MIL/uL   Hemoglobin 9.1 (L) 12.0 - 15.0 g/dL   HCT 31.1 (L) 36.0 - 46.0 %   MCV 99.7 80.0 - 100.0 fL   MCH 29.2 26.0 - 34.0 pg   MCHC 29.3 (L) 30.0 - 36.0 g/dL   RDW 20.4 (H) 11.5 - 15.5 %   Platelets 207 150 - 400 K/uL   nRBC 0.0 0.0 - 0.2 %  Comprehensive metabolic panel     Status: Abnormal   Collection Time: 05/31/18 11:48 AM  Result Value Ref Range   Sodium 141 135 - 145 mmol/L   Potassium 3.4 (L) 3.5 - 5.1 mmol/L   Chloride 113 (H) 98 - 111 mmol/L   CO2 23 22 - 32 mmol/L   Glucose, Bld 100 (H) 70 - 99 mg/dL   BUN 18 8 - 23 mg/dL   Creatinine, Ser 0.80 0.44 - 1.00 mg/dL   Calcium 8.1 (L) 8.9 -  10.3 mg/dL   Total Protein 5.8 (L) 6.5 - 8.1 g/dL   Albumin 2.6 (L) 3.5 - 5.0 g/dL   AST 17 15 - 41 U/L   ALT 16 0 - 44 U/L   Alkaline Phosphatase 90 38 - 126 U/L   Total Bilirubin 0.7 0.3 - 1.2 mg/dL   GFR calc non  Af Amer >60 >60 mL/min   GFR calc Af Amer >60 >60 mL/min   Anion gap 5 5 - 15  Lipid panel     Status: None   Collection Time: 05/31/18 11:48 AM  Result Value Ref Range   Cholesterol 82 0 - 200 mg/dL   Triglycerides 22 <150 mg/dL   HDL 50 >40 mg/dL   Total CHOL/HDL Ratio 1.6 RATIO   VLDL 4 0 - 40 mg/dL   LDL Cholesterol 28 0 - 99 mg/dL  Basic metabolic panel     Status: Abnormal   Collection Time: 06/01/18  4:58 AM  Result Value Ref Range   Sodium 140 135 - 145 mmol/L   Potassium 3.8 3.5 - 5.1 mmol/L   Chloride 112 (H) 98 - 111 mmol/L   CO2 24 22 - 32 mmol/L   Glucose, Bld 122 (H) 70 - 99 mg/dL   BUN 21 8 - 23 mg/dL   Creatinine, Ser 0.64 0.44 - 1.00 mg/dL   Calcium 7.9 (L) 8.9 - 10.3 mg/dL   GFR calc non Af Amer >60 >60 mL/min   GFR calc Af Amer >60 >60 mL/min   Anion gap 4 (L) 5 - 15  CBC     Status: Abnormal   Collection Time: 06/01/18  4:58 AM  Result Value Ref Range   WBC 6.3 4.0 - 10.5 K/uL   RBC 2.99 (L) 3.87 - 5.11 MIL/uL   Hemoglobin 8.7 (L) 12.0 - 15.0 g/dL   HCT 29.9 (L) 36.0 - 46.0 %   MCV 100.0 80.0 - 100.0 fL   MCH 29.1 26.0 - 34.0 pg   MCHC 29.1 (L) 30.0 - 36.0 g/dL   RDW 20.3 (H) 11.5 - 15.5 %   Platelets 200 150 - 400 K/uL   nRBC 0.0 0.0 - 0.2 %   Ct Angio Head W Or Wo Contrast  Result Date: 05/30/2018 CLINICAL DATA:  Left-sided weakness and ataxia EXAM: CT ANGIOGRAPHY HEAD AND NECK TECHNIQUE: Multidetector CT imaging of the head and neck was performed using the standard protocol during bolus administration of intravenous contrast. Multiplanar CT image reconstructions and MIPs were obtained to evaluate the vascular anatomy. Carotid stenosis measurements (when applicable) are obtained utilizing NASCET criteria, using the distal internal carotid diameter as the denominator. CONTRAST:  53m ISOVUE-370 IOPAMIDOL (ISOVUE-370) INJECTION 76% COMPARISON:  CTA head neck 04/24/2018 FINDINGS: CTA NECK FINDINGS Aortic arch: Atherosclerotic plaque.  Three vessel  branching. Right carotid system: Bulky mixed density plaque at the ICA bulb stenosis measuring up to 60%. No ulceration or beading. Left carotid system: Calcified plaque without flow limiting stenosis or ulceration. Vertebral arteries: Proximal subclavian atherosclerotic calcification without stenosis. Both vertebral arteries are smooth and widely patent to the dura. Dominant left vertebral artery. Skeleton: Remote C1 ring fracture known from 2019 imaging. No fracture displacement. Advanced cervical spine degeneration. Other neck: No significant incidental finding Upper chest: Patchy streaky density in the bilateral apex was also seen previously, compatible with scarring or pneumonitis. Review of the MIP images confirms the above findings CTA HEAD FINDINGS Anterior circulation: Atherosclerotic calcification of the carotid siphons. No branch occlusion, flow limiting stenosis,  beading, or aneurysm. Posterior circulation: Left dominant vertebral artery. The vertebral and basilar arteries are smooth and widely patent. No branch occlusion or flow limiting stenosis. Negative for aneurysm. Venous sinuses: Patent Anatomic variants: None significant Delayed phase: No abnormal intracranial enhancement Review of the MIP images confirms the above findings IMPRESSION: 1. No emergent finding or change from January 2020. 2. Cervical carotid atherosclerosis with up to 60% stenosis at the right ICA bulb. 3. Chronic lung disease. Electronically Signed   By: Monte Fantasia M.D.   On: 05/30/2018 15:32   Ct Head Wo Contrast  Result Date: 05/31/2018 CLINICAL DATA:  Stroke follow-up this eat (left-sided weakness and ataxia EXAM: CT HEAD WITHOUT CONTRAST TECHNIQUE: Contiguous axial images were obtained from the base of the skull through the vertex without intravenous contrast. COMPARISON:  05/30/2018 FINDINGS: Brain: No evidence of acute infarction, hemorrhage, hydrocephalus, extra-axial collection or mass lesion/mass effect. Mild for  age chronic small vessel ischemia in the cerebral white matter. Age congruent cerebral volume loss. 2 small sulcal or cortical calcification in the right parietal region that is stable from at least 12/28/2017 Vascular: Atherosclerotic calcification Skull: Remote C1 ring fractures with unchanged alignment on cervical CT yesterday. Sinuses/Orbits: Bilateral cataract resection IMPRESSION: No appreciable infarct or intracranial hemorrhage. Electronically Signed   By: Monte Fantasia M.D.   On: 05/31/2018 13:12   Ct Head Wo Contrast  Result Date: 05/30/2018 CLINICAL DATA:  Slurred speech, ataxia, left-sided weakness EXAM: CT HEAD WITHOUT CONTRAST TECHNIQUE: Contiguous axial images were obtained from the base of the skull through the vertex without intravenous contrast. COMPARISON:  05/15/2018, CT neck, 04/24/2018 FINDINGS: Brain: No evidence of acute infarction, hemorrhage, hydrocephalus, extra-axial collection or mass lesion/mass effect. Periventricular white matter hypodensity. Vascular: No hyperdense vessel or unexpected calcification. Skull: Normal. Negative for fracture or focal lesion. Sinuses/Orbits: No acute finding. Other: Redemonstrated fractures of the dens and C1 vertebral body, better evaluated by prior CT examination of the neck. IMPRESSION: 1. No acute intracranial pathology. Small-vessel white matter disease. 2. Redemonstrated fractures of the dens and C1 vertebral body, better evaluated by prior CT examination of the neck. Electronically Signed   By: Eddie Candle M.D.   On: 05/30/2018 15:11   Ct Angio Neck W And/or Wo Contrast  Result Date: 05/30/2018 CLINICAL DATA:  Left-sided weakness and ataxia EXAM: CT ANGIOGRAPHY HEAD AND NECK TECHNIQUE: Multidetector CT imaging of the head and neck was performed using the standard protocol during bolus administration of intravenous contrast. Multiplanar CT image reconstructions and MIPs were obtained to evaluate the vascular anatomy. Carotid stenosis  measurements (when applicable) are obtained utilizing NASCET criteria, using the distal internal carotid diameter as the denominator. CONTRAST:  12m ISOVUE-370 IOPAMIDOL (ISOVUE-370) INJECTION 76% COMPARISON:  CTA head neck 04/24/2018 FINDINGS: CTA NECK FINDINGS Aortic arch: Atherosclerotic plaque.  Three vessel branching. Right carotid system: Bulky mixed density plaque at the ICA bulb stenosis measuring up to 60%. No ulceration or beading. Left carotid system: Calcified plaque without flow limiting stenosis or ulceration. Vertebral arteries: Proximal subclavian atherosclerotic calcification without stenosis. Both vertebral arteries are smooth and widely patent to the dura. Dominant left vertebral artery. Skeleton: Remote C1 ring fracture known from 2019 imaging. No fracture displacement. Advanced cervical spine degeneration. Other neck: No significant incidental finding Upper chest: Patchy streaky density in the bilateral apex was also seen previously, compatible with scarring or pneumonitis. Review of the MIP images confirms the above findings CTA HEAD FINDINGS Anterior circulation: Atherosclerotic calcification of the carotid siphons. No branch  occlusion, flow limiting stenosis, beading, or aneurysm. Posterior circulation: Left dominant vertebral artery. The vertebral and basilar arteries are smooth and widely patent. No branch occlusion or flow limiting stenosis. Negative for aneurysm. Venous sinuses: Patent Anatomic variants: None significant Delayed phase: No abnormal intracranial enhancement Review of the MIP images confirms the above findings IMPRESSION: 1. No emergent finding or change from January 2020. 2. Cervical carotid atherosclerosis with up to 60% stenosis at the right ICA bulb. 3. Chronic lung disease. Electronically Signed   By: Monte Fantasia M.D.   On: 05/30/2018 15:32   Vas US Carotid (at [place] Only)  Result Date: 05/31/2018 Carotid Arterial Duplex Study Indications:       CTA of neck  shows 60% right ICA stenosis. Comparison Study:  Prior study from 04/24/18 is available for comparison Performing Technologist: Sharion Dove RVS  Examination Guidelines: A complete evaluation includes B-mode imaging, spectral Doppler, color Doppler, and power Doppler as needed of all accessible portions of each vessel. Bilateral testing is considered an integral part of a complete examination. Limited examinations for reoccurring indications may be performed as noted.  Right Carotid Findings: +----------+--------+--------+--------+---------------------+------------------+           PSV cm/sEDV cm/sStenosisDescribe             Comments           +----------+--------+--------+--------+---------------------+------------------+ CCA Prox  80      19                                   intimal thickening +----------+--------+--------+--------+---------------------+------------------+ CCA Distal78      18                                   intimal thickening +----------+--------+--------+--------+---------------------+------------------+ ICA Prox  134     39      40-59%  calcific and         Shadowing                                            irregular                               +----------+--------+--------+--------+---------------------+------------------+ ICA Mid   125     38                                                      +----------+--------+--------+--------+---------------------+------------------+ ICA Distal130     30                                                      +----------+--------+--------+--------+---------------------+------------------+ ECA       163     14                                                      +----------+--------+--------+--------+---------------------+------------------+ +----------+--------+-------+--------+-------------------+  PSV cm/sEDV cmsDescribeArm Pressure (mmHG)  +----------+--------+-------+--------+-------------------+ Subclavian102                                        +----------+--------+-------+--------+-------------------+ +---------+--------+--+--------+--+ VertebralPSV cm/s44EDV cm/s12 +---------+--------+--+--------+--+  Left Carotid Findings: +----------+--------+--------+--------+--------+------------------+           PSV cm/sEDV cm/sStenosisDescribeComments           +----------+--------+--------+--------+--------+------------------+ CCA Prox  135     28                      intimal thickening +----------+--------+--------+--------+--------+------------------+ CCA Distal81      28                      intimal thickening +----------+--------+--------+--------+--------+------------------+ ICA Prox  93      33              calcificShadowing          +----------+--------+--------+--------+--------+------------------+ ICA Distal78      25                                         +----------+--------+--------+--------+--------+------------------+ ECA       120     22                                         +----------+--------+--------+--------+--------+------------------+ +----------+--------+--------+--------+-------------------+ SubclavianPSV cm/sEDV cm/sDescribeArm Pressure (mmHG) +----------+--------+--------+--------+-------------------+           160                                         +----------+--------+--------+--------+-------------------+ +---------+--------+--+--------+--+ VertebralPSV cm/s55EDV cm/s14 +---------+--------+--+--------+--+  Summary: Right Carotid: Velocities in the right ICA are consistent with a 40-59%                stenosis. Left Carotid: Velocities in the left ICA are consistent with a 1-39% stenosis. Vertebrals:  Bilateral vertebral arteries demonstrate antegrade flow. Subclavians: Normal flow hemodynamics were seen in bilateral subclavian               arteries. *See table(s) above for measurements and observations.     Preliminary     Assessment/Plan: Diagnosis: Possible embolic stroke Labs independently reviewed.  Records reviewed and summated above.  1. Does the need for close, 24 hr/day medical supervision in concert with the patient's rehab needs make it unreasonable for this patient to be served in a less intensive setting? Potentially 2. Co-Morbidities requiring supervision/potential complications: CHF (Monitor in accordance with increased physical activity and avoid UE resistance excercises), HTN (monitor and provide prns in accordance with increased physical exertion and pain), cervical stenosis with chronic pain, T2 DM (Monitor in accordance with exercise and adjust meds as necessary), A fib, PPM, Dyspnea--likely due to COPD, recent brainstem stroke, ABLA (repeat labs, consider transfusion if necessary to ensure appropriate perfusion for increased activity tolerance) 3. Due to safety, disease management, pain management and patient education, does the patient require 24 hr/day rehab nursing? Potentially 4. Does the patient require coordinated care of a physician, rehab nurse, PT (1-2 hrs/day, 5 days/week) and OT (1-2 hrs/day,  5 days/week) to address physical and functional deficits in the context of the above medical diagnosis(es)? Potentially Addressing deficits in the following areas: balance, endurance, locomotion, strength, transferring, bathing, dressing, toileting and psychosocial support 5. Can the patient actively participate in an intensive therapy program of at least 3 hrs of therapy per day at least 5 days per week? Yes 6. The potential for patient to make measurable gains while on inpatient rehab is excellent 7. Anticipated functional outcomes upon discharge from inpatient rehab are modified independent and supervision  with PT, modified independent and supervision with OT, n/a with SLP. 8. Estimated rehab length of stay to  reach the above functional goals is: 4-7 days. 9. Anticipated D/C setting: Home 10. Anticipated post D/C treatments: HH therapy and Home excercise program 11. Overall Rehab/Functional Prognosis: excellent and good  RECOMMENDATIONS: This patient's condition is appropriate for continued rehabilitative care in the following setting: Will await completion of CEA and reevaluate.  Patient with good functional gains since evaluation, may not require CIR. Patient has agreed to participate in recommended program. Yes Note that insurance prior authorization may be required for reimbursement for recommended care.  Comment: Rehab Admissions Coordinator to follow up.   I have personally performed a face to face diagnostic evaluation, including, but not limited to relevant history and physical exam findings, of this patient and developed relevant assessment and plan.  Additionally, I have reviewed and concur with the physician assistant's documentation above.   Delice Lesch, MD, ABPMR Bary Leriche, PA-C 06/01/2018

## 2018-06-01 NOTE — Progress Notes (Signed)
Physical Therapy Treatment Patient Details Name: Meredith Gonzalez MRN: [id number] DOB: 1935/10/03 Today's Date: 06/01/2018    History of Present Illness Pt admitted 05/30/2018 for suspected stroke but has not had MRI yet to confirm. PMH significant for recent stroke, DM, HTN, pacemaker, CHF, cervical fracture, femur fracture, recurrent falls.     PT Comments    Pt making good progress with mobility. Noted  Carotid endarterectomy planned for Wed or Thursday. Will  continue to follow while pt remains in the hospital.  Pt very motivated to work with therapies.     Follow Up Recommendations  CIR;Supervision for mobility/OOB     Equipment Recommendations  None recommended by PT    Recommendations for Other Services       Precautions / Restrictions Precautions Precautions: Fall Precaution Comments: has fallen a lot with injuries over past 3 years recent in Sept broke her neck Restrictions Weight Bearing Restrictions: No    Mobility  Bed Mobility Overal bed mobility: (NT, Pt up in recliner) Bed Mobility: Supine to Sit     Supine to sit: Supervision;HOB elevated Sit to supine: Min guard   General bed mobility comments: Min Guard A for safety  Transfers Overall transfer level: Needs assistance Equipment used: Rolling walker (2 wheeled) Transfers: Sit to/from Stand Sit to Stand: Min guard Stand pivot transfers: Min assist       General transfer comment: practiced multiple times for strengthening without using UE supoprt for stand to sit  Ambulation/Gait Ambulation/Gait assistance: Min assist Gait Distance (Feet): 30 Feet(Performed twice) Assistive device: Rolling walker (2 wheeled) Gait Pattern/deviations: Step-through pattern     General Gait Details: On second walk had to teak two standing breaks secondary to she was feeling like she was falling forwards.  Also practiced ambulating backwards, which was challenging for pt.     Stairs             Wheelchair  Mobility    Modified Rankin (Stroke Patients Only) Modified Rankin (Stroke Patients Only) Pre-Morbid Rankin Score: Slight disability Modified Rankin: Moderately severe disability     Balance Overall balance assessment: Needs assistance;History of Falls Sitting-balance support: Feet supported Sitting balance-Leahy Scale: Fair   Postural control: Posterior lean;Right lateral lean(no LOB, but lean present) Standing balance support: Bilateral upper extremity supported Standing balance-Leahy Scale: Poor Standing balance comment: UE support                            Cognition Arousal/Alertness: Awake/alert Behavior During Therapy: WFL for tasks assessed/performed Overall Cognitive Status: Within Functional Limits for tasks assessed Area of Impairment: Safety/judgement;Problem solving                   Current Attention Level: Selective     Safety/Judgement: Decreased awareness of deficits;Decreased awareness of safety   Problem Solving: Slow processing General Comments: Increased processing time      Exercises General Exercises - Lower Extremity Ankle Circles/Pumps: AROM;Both;10 reps;Seated Mini-Sqauts: AROM;Both;10 reps;Standing;Other (comment)(used RW for balance)    General Comments General comments (skin integrity, edema, etc.): Pt wore tennis shoes during session      Pertinent Vitals/Pain Pain Assessment: 0-10 Pain Score: 5  Pain Location: neck Pain Descriptors / Indicators: Aching Pain Intervention(s): Premedicated before session;Monitored during session    [name] expects to be discharged to:: Private residence Living Arrangements: Alone Available Help at Discharge: Family;Available PRN/intermittently Type of Home: House Home Access: Level entry   Home Layout: One  level Home Equipment: Walker - 4 wheels;Walker - 2 wheels;Bedside commode;Shower seat - built in;Grab bars - tub/shower Additional Comments: Lives in  [facility]", however son is making arrangements for her to potentially live in basement MIL suite    Prior Function Level of Independence: Independent with assistive device(s)      Comments: used cane to help with balance till got to shopping cart when grocery shopping; rollator in the home   PT Goals (current goals can now be found in the care plan section) Acute Rehab PT Goals Patient Stated Goal: "go back home" Progress towards PT goals: Progressing toward goals    Frequency    Min 4X/week      PT Plan Current plan remains appropriate    Co-evaluation              AM-PAC PT "6 Clicks" Mobility   Outcome Measure  Help needed turning from your back to your side while in a flat bed without using bedrails?: A Little Help needed moving from lying on your back to sitting on the side of a flat bed without using bedrails?: A Little Help needed moving to and from a bed to a chair (including a wheelchair)?: A Little Help needed standing up from a chair using your arms (e.g., wheelchair or bedside chair)?: A Little Help needed to walk in hospital room?: A Little Help needed climbing 3-5 steps with a railing? : A Lot 6 Click Score: 17    End of Session Equipment Utilized During Treatment: Gait belt Activity Tolerance: Patient tolerated treatment well Patient left: in chair;with call bell/phone within reach;with chair alarm set   PT Visit Diagnosis: Hemiplegia and hemiparesis Hemiplegia - Right/Left: Left Hemiplegia - dominant/non-dominant: Non-dominant Hemiplegia - caused by: Cerebral infarction     Time: 1009-1036 PT Time Calculation (min) (ACUTE ONLY): 27 min  Charges:  $Gait Training: 8-22 mins $Neuromuscular Re-education: 8-22 mins                     Meredith Gonzalez, PT   Acute Rehabilitation Services  Pager [phone] Office [phone] 06/01/2018    Meredith Gonzalez 06/01/2018, 11:28 AM

## 2018-06-01 NOTE — Progress Notes (Addendum)
STROKE TEAM PROGRESS NOTE   SUBJECTIVE (INTERVAL HISTORY) Son and granddaughter are at the bedside.  She is sitting in bed, no acute event overnight.  Neuro stable.  Vascular surgery planned for right CEA Wednesday or Thursday.    OBJECTIVE Vitals:   06/01/18 0336 06/01/18 0828 06/01/18 0848 06/01/18 1239  BP: (!) 123/59 135/75  (!) 136/55  Pulse: 74 73  70  Resp: 15 16  16   Temp: 98.2 F (36.8 C) 98.1 F (36.7 C)  97.9 F (36.6 C)  TempSrc: Oral Oral  Oral  SpO2: 97% 97% 94% 95%  Weight:      Height:        CBC:  Recent Labs  Lab 05/30/18 1413 05/31/18 1148 06/01/18 0458  WBC 10.7* 6.5 6.3  NEUTROABS 8.8*  --   --   HGB 10.2* 9.1* 8.7*  HCT 35.1* 31.1* 29.9*  MCV 100.6* 99.7 100.0  PLT 234 207 009    Basic Metabolic Panel:  Recent Labs  Lab 05/31/18 1148 06/01/18 0458  NA 141 140  K 3.4* 3.8  CL 113* 112*  CO2 23 24  GLUCOSE 100* 122*  BUN 18 21  CREATININE 0.80 0.64  CALCIUM 8.1* 7.9*    Lipid Panel:     Component Value Date/Time   CHOL 82 05/31/2018 1148   CHOL 95 (L) 03/20/2017 0728   TRIG 22 05/31/2018 1148   HDL 50 05/31/2018 1148   HDL 58 03/20/2017 0728   CHOLHDL 1.6 05/31/2018 1148   VLDL 4 05/31/2018 1148   LDLCALC 28 05/31/2018 1148   LDLCALC 28 03/20/2017 0728   HgbA1c:  Lab Results  Component Value Date   HGBA1C 5.7 (H) 05/31/2018   Urine Drug Screen: No results found for: LABOPIA, COCAINSCRNUR, LABBENZ, AMPHETMU, THCU, LABBARB  Alcohol Level     Component Value Date/Time   ETH <10 04/24/2018 0545    IMAGING  Ct Angio Head W Or Wo Contrast Ct Angio Neck W And/or Wo Contrast 05/30/2018 IMPRESSION:  1. No emergent finding or change from January 2020.  2. Cervical carotid atherosclerosis with up to 60% stenosis at the right ICA bulb.  3. Chronic lung disease.   Ct Head Wo Contrast 05/30/2018 IMPRESSION:  1. No acute intracranial pathology. Small-vessel white matter disease.  2. Redemonstrated fractures of the dens and C1  vertebral body, better evaluated by prior CT examination of the neck.   Ct Head Wo Contrast 05/31/2018 No appreciable infarct or intracranial hemorrhage.  Transthoracic Echocardiogram  05/31/2018 IMPRESSIONS  1. Limited study.  2. The left ventricle has normal systolic function, with an ejection fraction of 60-65%. The cavity size was normal. No evidence of left ventricular regional wall motion abnormalities. Diastolic function was not assessed.  3. The right ventricle has normal systolc function. The cavity was normal. There is no increase in right ventricular wall thickness. Right ventricular systolic pressure is mildly elevated with an estimated pressure of 38.3 mmHg. Device wire present.  4. Left atrial size was mildly dilated.  5. The aortic valve is tricuspid Mild aortic annular calcification noted.  6. The mitral valve is normal in structure. There is mild mitral annular calcification present.  7. The tricuspid valve was normal in structure.  8. The aortic root is normal in size and structure.   Bilateral Carotid Dopplers 05/31/2018 Summary: Right Carotid: Velocities in the right ICA are consistent with a 40-59% stenosis. Left Carotid: Velocities in the left ICA are consistent with a 1-39% stenosis. Vertebrals:  Bilateral vertebral arteries demonstrate antegrade flow. Subclavians: Normal flow hemodynamics were seen in bilateral subclavian arteries.   PHYSICAL EXAM  Temp:  [97.8 F (36.6 C)-98.2 F (36.8 C)] 97.9 F (36.6 C) (03/09 1239) Pulse Rate:  [67-77] 70 (03/09 1239) Resp:  [15-16] 16 (03/09 1239) BP: (115-136)/(52-75) 136/55 (03/09 1239) SpO2:  [94 %-100 %] 95 % (03/09 1239)  General - Well nourished, well developed, in no apparent distress.  Ophthalmologic - fundi not visualized due to noncooperation.  Cardiovascular - Regular rate and rhythm.  Mental Status -  Level of arousal and orientation to time, place, and person were intact. Language including  expression, naming, repetition, comprehension was assessed and found intact. Fund of Knowledge was assessed and was intact.  Cranial Nerves II - XII - II - Visual field intact OU. III, IV, VI - Extraocular movements intact. V - Facial sensation intact bilaterally. VII - mild left nasolabial fold flattening. VIII - Hearing & vestibular intact bilaterally. X - Palate elevates symmetrically. XI - Chin turning & shoulder shrug intact bilaterally. XII - Tongue protrusion intact.  Motor Strength - The patient's strength was normal in RUE and RLE extremities, but left upper extremity 4+/5 and left lower extremity 5/5 proximal and 4/5 distally.  Bulk was normal and fasciculations were absent.   Motor Tone - Muscle tone was assessed at the neck and appendages and was normal.  Reflexes - The patient's reflexes were symmetrical in all extremities and she had no pathological reflexes.  Sensory - Light touch, temperature/pinprick were assessed and were symmetrical.    Coordination - The patient had normal movements in the right hand with no ataxia or dysmetria, however left finger-to-nose slow and mild dysmetria, but proportional to the weakness.  Tremor was absent.  Gait and Station - deferred.    ASSESSMENT/PLAN [name] is a 83 y.o. female with history of a previous stroke, atrial fibrillation on Eliquis, syncope, permanent pacemaker, coronary artery disease with previous MI, hypothyroidism, hypertension, congestive heart failure, and diabetes mellitus presenting with unsteadiness, increased slurred speech, and double vision. She did not receive IV t-PA due to late presentation.  Possible small right brain stroke - concerning recurrent stroke symptoms due to right ICA 60% stenosis with significant plaque formation.  Although A. fib as etiology of current stroke cannot be completely excluded, however patient compliant with Eliquis and recent two strokes are with similar left-sided  symptoms.  Resultant left mild hemiparesis  CT head - No acute intracranial pathology. Small-vessel white matter disease.   Repeat CT Head - no acute finding  MRI head - PPM  CTA H&N - Cervical carotid atherosclerosis with up to 60% stenosis at the right ICA bulb.   Carotid Doppler - Velocities in the right ICA are consistent with a 40-59% stenosis.  2D Echo  - EF 60 - 65%. No cardiac source of emboli identified.   LDL - 28  HgbA1c 5.7  VTE prophylaxis - Eliquis  Diet - Heart healthy / carb modified with thin liquids.  Eliquis (apixaban) 2.5 mg twice daily prior to admission, was put on Eliquis (apixaban) 5 mg twice daily.  Currently on heparin IV bridge for preparation of right CEA procedure  Patient counseled to be compliant with her antithrombotic medications  Ongoing aggressive stroke risk factor management  Therapy recommendations:  Possible CIR admission - to be re-evaluated after CEA  Disposition:  Pending  History of possible stroke  03/2018 patient was admitted for stroke symptoms.  However, the exact  symptoms were not quite clear, including left sided tingling, slurred speech, left-sided ataxia, expressive aphasia and diplopia.  There was question whether this was more consistent with right MCA involvement or posterior circulation ischemia.  MRI cannot be done due to pacemaker.  CT no acute abnormality x2.  CT head and neck right ICA 60% stenosis with bulky soft plaques.  Carotid Doppler negative.  EF 60 to 65% LDL 52 and A1c 6.3.  Dr. Donnetta Hutching was consulted for potential right CEA, but eventually that was not performed given not able to rule out posterior circulation ischemia and a stroke likely due to A. fib not on anticoagulation.  She was discharged with Eliquis, however on lower dose at 2.5 mg twice daily.  Carotid stenosis, right  CTA head and neck in 03/2018 right ICA 60% stenosis with bulky plaque  CT head and neck in 05/2018 again showed right ICA 60% stenosis  with bulky plaque  Dr. Donnetta Hutching was consulted in 03/2018 and recommend CTA if felt to be needed  Taken together of stroke events in 03/2018 and this admission were concerning for symptomatic right ICA stenosis  Vascular surgery consulted and agreed with right CEA which can be done Wednesday or Thursday.  Afib on eliquis  Pt started eliquis 03/2018 after last stroke  On 2.5 mg twice daily PTA  Patient is borderline for 2.5 or 5 mg twice daily dosing, and discussed with pharmacy, will continue 5 mg twice daily.  Currently patient on heparin IV bridge in preparation of right CEA  Anemia, microcytic  Hemoglobin 10.2-9.1 -> 8.7  Likely microcytic anemia  Close monitoring  Stool for occult blood ordered  Has daily CBCs ordered.  Hypertension  Stable . Permissive hypertension (OK if < 220/120) but gradually normalize in 5-7 days . Long-term BP goal normotensive  Hyperlipidemia  Lipid lowering medication PTA: Lipitor 40 mg daily  LDL 28, goal < 70  Current lipid lowering medication: Lipitor 20 mg daily  Continue statin at discharge  Diabetes  HgbA1c 5.7, goal < 7.0  Controlled  SSI  CBG monitoring  Other Stroke Risk Factors  Advanced age  Former cigarette smoker - quit 24 years ago  ETOH use, advised to drink no more than 1 alcoholic beverage per day.  Coronary artery disease  CHF status post pacemaker  Other Active Problems  CT head redemonstrated fractures of the dens and C1 vertebral body, which was stable from CT cervical spine 12/2017.  Patient has been following with Dr. Vertell Limber for this issue, no surgery was required.  Patient needs to make appointment with Dr. Vertell Limber for continued follow-up again ASAP.   Hospital day # 1  Neurology will sign off. Please call with questions. Pt will follow up with stroke clinic NP at [facility] on 06/25/18 as scheduled. Thanks for the consult.   Rosalin Hawking, MD PhD Stroke Neurology 06/01/2018 5:18 PM  To contact Stroke  Continuity provider, please refer to [email or web address] After hours, contact General Neurology

## 2018-06-01 NOTE — Progress Notes (Signed)
[facility]   Subjective  - No new neurological events.   Objective (!) 123/59 74 98.2 F (36.8 C) (Oral) 15 97% No intake or output data in the 24 hours ending 06/01/18 0751  CN II-XII grossly intact No focal deficits appreciable  Laboratory Lab Results: Recent Labs    05/31/18 1148 06/01/18 0458  WBC 6.5 6.3  HGB 9.1* 8.7*  HCT 31.1* 29.9*  PLT 207 200   BMET Recent Labs    05/31/18 1148 06/01/18 0458  NA 141 140  K 3.4* 3.8  CL 113* 112*  CO2 23 24  GLUCOSE 100* 122*  BUN 18 21  CREATININE 0.80 0.64  CALCIUM 8.1* 7.9*    COAG Lab Results  Component Value Date   INR 1.1 05/30/2018   INR 1.26 04/24/2018   INR 1.03 02/10/2018   No results found for: PTT  Assessment/Planning:  After discussion with patient had left arm symptoms during first event in February and left leg symptoms on this presentation in setting of moderate right ICA stenosis.  Agree patient will benefit from carotid endarterectomy and now neurology agrees concern for symptomatic carotid.  Cannot get MRI given ICD.  Tentative plan for R CEA Wednesday with Dr. Donnetta Hutching or Thursday with me.  Please hold Eliquis starting this morning - heparin bridge if needed ok.  Marty Heck 06/01/2018 7:51 AM --

## 2018-06-01 NOTE — Progress Notes (Signed)
ANTICOAGULATION CONSULT NOTE - Follow Up Consult  Pharmacy Consult for Heparin (Eliquis on hold) Indication: atrial fibrillation with new CVA  Allergies  Allergen Reactions  . Valium [Diazepam] Anxiety    Makes patient hyper  . Codeine Hives and Itching    Can take with Benadryl   . Darifenacin Itching    Can take with Benadryl  . Darvon [Propoxyphene Hcl] Itching    Can take with Benadryl  . Daypro [Oxaprozin] Itching    Can take with Benadryl  . Enablex [Darifenacin Hydrobromide Er] Itching    Can take with Benadryl  . Oxycodone Itching    Can take with Benadryl   . Propoxyphene Itching    Can take with Benadryl  . Risperdal [Risperidone] Itching    Can take with Benadryl  . Talwin [Pentazocine] Itching    Can take with Benadryl  . Vicodin [Hydrocodone-Acetaminophen] Itching    Can take with Benadryl     Patient Measurements: Height: 5\' 5"  (165.1 cm) Weight: 133 lb 6.1 oz (60.5 kg) IBW/kg (Calculated) : 57 Heparin Dosing Weight: 60.5 kg  Vital Signs: Temp: 97.5 F (36.4 C) (03/09 2027) Temp Source: Oral (03/09 2027) BP: 138/54 (03/09 2027) Pulse Rate: 67 (03/09 2027)  Labs: Recent Labs    05/30/18 1413 05/31/18 1148 06/01/18 0458 06/01/18 2037 06/01/18 2100  HGB 10.2* 9.1* 8.7*  --   --   HCT 35.1* 31.1* 29.9*  --   --   PLT 234 207 200  --   --   APTT 40*  --   --   --  36  LABPROT 13.6  --   --   --   --   INR 1.1  --   --   --   --   HEPARINUNFRC  --   --   --  0.72*  --   CREATININE 1.19* 0.80 0.64  --   --     Estimated Creatinine Clearance: 50.5 mL/min (by C-G formula based on SCr of 0.64 mg/dL).   Medications:  Infusions:  . heparin 850 Units/hr (06/01/18 1850)    Assessment: 83 yo F on Eliquis PTA for hx afib.  Pt admitted with unsteadiness, increased slurred speech, and double vision.Shedid not receive IV t-PA due to late presentation and Eliquis use.  Tentative plan for R CEA later this week.  Holding Eliquis now for procedure.   Last Eliquis dose was 3/8 at 2136.  Pharmacist originally wrote orders to start heparin at 1400.  This however was delayed until 1900.  Levels drawn this evening are not accurate.  Goal of Therapy:  Heparin level 0.3-0.5 units/ml aPTT 66-85 seconds Monitor platelets by anticoagulation protocol: Yes   Plan:  Continue heparin at 850 units/hr. Next heparin level and aPTT at 0300 3/10.  [employer], Pharm.D., BCPS Clinical Pharmacist Clinical phone for 06/01/2018 from 2:30-10:00 is [phone].  **Pharmacist phone directory can now be found on [email or web address] ([email or web address]).  Listed under [place].  06/01/2018 10:04 PM

## 2018-06-01 NOTE — Progress Notes (Signed)
PROGRESS NOTE   Meredith Gonzalez  ZOX:[id number]    DOB: Apr 02, 1935    DOA: 05/30/2018  PCP: Sinclair Ship, MD   I have briefly reviewed patients previous medical records in [facility].  Brief Narrative:  83 year old female, lives alone, ambulates with the help of a Rollator or walker, PMH of DM 2, HTN, hypothyroid, A. fib on Eliquis, CAD/MI, chronic diastolic CHF, PPM, CVA, anxiety and depression, presented to [place] due to slurred speech, double vision, left-sided weakness and a fall that started on 05/28/2018.  Admitted for possible small right brain stroke due to right ICA stenosis.  Neurology and vascular surgery consulted.  Tentatively plan for right CEA on 3/11 or 3/12.  CIR consulted.  Eliquis held (last dose on 3/8 at 9:36 PM).   Assessment & Plan:   Principal Problem:   Stroke [facility]) Active Problems:   Anemia   Persistent atrial fibrillation   Hypertension   Hypothyroidism   Diabetes ([place])   Pacemaker   CAD (coronary artery disease)   Chronic diastolic congestive heart failure (HCC)   Acute ischemic stroke (HCC)   Stroke-like symptom   Dyspnea   Acute blood loss anemia   1. Possible small right brain stroke: Neurology consulted and assisted with evaluation and management.  Resultant dysarthria, diplopia and left hemiparesis (left lower extremity weaker).  CT head: No acute intracranial pathology.  Small vessel white matter disease.  Redemonstrated fractures of the dens and C1 vertebral body.  CTA of the head and neck: No emergent finding or change from January 2020.  Cervical carotid atherosclerosis with up to 60% stenosis at the right ICA bulb.  Chronic lung disease.  Unable to do MRI due to PPM.  Thereby repeated CT head 3/8: No appreciable infarct or intracranial hemorrhage.  TTE: LVEF 60-65%.  Carotid Doppler: Right 40-59% ICA stenosis.  Left 1-39% ICA stenosis.  LDL 28.  A1c 5.7.  As per stroke MD follow-up (discussed with Dr. Erlinda Hong) >patient presentation  concerning for recurrent stroke due to symptomatic right ICA stenosis versus A. fib on lower dose of Eliquis with which she is compliant.  She has had 2 admissions with similar presentations of left-sided weakness.  Patient was on Eliquis 2.5 mg twice daily prior to admission, now on Eliquis 5 mg twice daily.  Vascular surgery input appreciated and plan right CEA 3/11 or 3/12. 2. Right carotid stenosis: During previous admission, vascular surgery was consulted, right CEA was contemplated but eventually not performed because posterior circulation CVA could not be ruled out (no MRI) and stroke was also felt to be due to A. fib not on anticoagulation.  She had then been discharged on Eliquis 2.5 mg twice daily.  As per neurology, given recurrent presentation with similar symptoms, suspect symptomatic right ICA stenosis.  Vascular surgery input appreciated and plan right CEA for 3/11 or 3/12. 3. Paroxysmal A. fib: Patient started Eliquis after last stroke in January 2020.  She was on 2.5 mg twice daily.  Given recurrent stroke and room for increase, Eliquis dose increased to 5 mg twice daily.  She is A paced or sinus rhythm on telemetry.  Due to upcoming CEA, Eliquis held, last dose 3/8 at 9:36 PM.  As per VVS and discussion with Dr. Erlinda Hong, started IV heparin bridge. 4. Type II DM: A1c 5.7, goal <7. 5. Hyperlipidemia: LDL 28, goal <70.  Atorvastatin reduced from 40 mg daily to 20 mg daily. 6. Essential hypertension: Allow permissive hypertension.  Currently controlled  off of meds. 7. CAD: No anginal symptoms. 8. Chronic diastolic CHF: TTE results as above.  Discontinued IV fluids.  Continue home dose of furosemide 10 mg daily.  Compensated. 9. Status post PPM: 10. Fracture of dens and C1 vertebral body: Noted on CT head, stable from CT spine 12/2017.  Outpatient follow-up with Dr. Vertell Limber, neurosurgery. 11. Anemia: Hemoglobin gradually drifting down.  No overt bleeding.  Follow CBC in a.m. 12. Hypokalemia:  Replaced 13. Possible COPD: Seen by pulmonology on 2/28, had abnormal PFTs and has been scheduled to get high resolution CT on 3/12.  Started on Symbicort.   DVT prophylaxis: Eliquis Code Status: DNR Family Communication: Discussed in detail with patient's son on 3/8, updated care and answered questions.  None at bedside today. Disposition: To be determined pending right CEA.   Consultants:  Neurology Vascular surgery   Procedures:  None  Antimicrobials:  None   Subjective: Diplopia has improved.  Persisting left leg weakness, unchanged compared to yesterday.  ROS: As above, otherwise negative.  Objective:  Vitals:   06/01/18 0336 06/01/18 0828 06/01/18 0848 06/01/18 1239  BP: (!) 123/59 135/75  (!) 136/55  Pulse: 74 73  70  Resp: 15 16  16   Temp: 98.2 F (36.8 C) 98.1 F (36.7 C)  97.9 F (36.6 C)  TempSrc: Oral Oral  Oral  SpO2: 97% 97% 94% 95%  Weight:      Height:        Examination:  General exam: Pleasant elderly female, small built and nourished sitting up comfortably in bed without distress. Respiratory system: Clear to auscultation.  No increased work of breathing. Cardiovascular system: S1 & S2 heard, RRR. No JVD, murmurs, rubs, gallops or clicks. No pedal edema.  Telemetry personally reviewed: AV paced rhythm. Gastrointestinal system: Abdomen is nondistended, soft and nontender. No organomegaly or masses felt. Normal bowel sounds heard.  Stable Central nervous system: Alert and oriented. No focal neurological deficits. Extremities: 5 x 5 power in right extremities and grade 4 x 5 power in left extremities.  Unchanged/stable Skin: Patient has couple of small bruises on her back. Psychiatry: Judgement and insight appear normal. Mood & affect appropriate.     Data Reviewed: I have personally reviewed following labs and imaging studies  CBC: Recent Labs  Lab 05/30/18 1413 05/31/18 1148 06/01/18 0458  WBC 10.7* 6.5 6.3  NEUTROABS 8.8*  --   --     HGB 10.2* 9.1* 8.7*  HCT 35.1* 31.1* 29.9*  MCV 100.6* 99.7 100.0  PLT 234 207 161   Basic Metabolic Panel: Recent Labs  Lab 05/30/18 1413 05/31/18 1148 06/01/18 0458  NA 137 141 140  K 3.6 3.4* 3.8  CL 108 113* 112*  CO2 22 23 24   GLUCOSE 104* 100* 122*  BUN 29* 18 21  CREATININE 1.19* 0.80 0.64  CALCIUM 8.2* 8.1* 7.9*   Liver Function Tests: Recent Labs  Lab 05/30/18 1413 05/31/18 1148  AST 19 17  ALT 18 16  ALKPHOS 102 90  BILITOT 0.7 0.7  PROT 7.3 5.8*  ALBUMIN 3.4* 2.6*   Coagulation Profile: Recent Labs  Lab 05/30/18 1413  INR 1.1   Cardiac Enzymes: No results for input(s): CKTOTAL, CKMB, CKMBINDEX, TROPONINI in the last 168 hours. HbA1C: Recent Labs    05/31/18 1148  HGBA1C 5.7*   CBG: Recent Labs  Lab 05/30/18 1410  GLUCAP 87    No results found for this or any previous visit (from the past 240 hour(s)).  Radiology Studies: Ct Head Wo Contrast  Result Date: 05/31/2018 CLINICAL DATA:  Stroke follow-up this eat (left-sided weakness and ataxia EXAM: CT HEAD WITHOUT CONTRAST TECHNIQUE: Contiguous axial images were obtained from the base of the skull through the vertex without intravenous contrast. COMPARISON:  05/30/2018 FINDINGS: Brain: No evidence of acute infarction, hemorrhage, hydrocephalus, extra-axial collection or mass lesion/mass effect. Mild for age chronic small vessel ischemia in the cerebral white matter. Age congruent cerebral volume loss. 2 small sulcal or cortical calcification in the right parietal region that is stable from at least 12/28/2017 Vascular: Atherosclerotic calcification Skull: Remote C1 ring fractures with unchanged alignment on cervical CT yesterday. Sinuses/Orbits: Bilateral cataract resection IMPRESSION: No appreciable infarct or intracranial hemorrhage. Electronically Signed   By: Monte Fantasia M.D.   On: 05/31/2018 13:12   Vas US Carotid (at [place] Only)  Result Date: 06/01/2018 Carotid Arterial Duplex  Study Indications:       CTA of neck shows 60% right ICA stenosis. Comparison Study:  Prior study from 04/24/18 is available for comparison Performing Technologist: Sharion Dove RVS  Examination Guidelines: A complete evaluation includes B-mode imaging, spectral Doppler, color Doppler, and power Doppler as needed of all accessible portions of each vessel. Bilateral testing is considered an integral part of a complete examination. Limited examinations for reoccurring indications may be performed as noted.  Right Carotid Findings: +----------+--------+--------+--------+---------------------+------------------+           PSV cm/sEDV cm/sStenosisDescribe             Comments           +----------+--------+--------+--------+---------------------+------------------+ CCA Prox  80      19                                   intimal thickening +----------+--------+--------+--------+---------------------+------------------+ CCA Distal78      18                                   intimal thickening +----------+--------+--------+--------+---------------------+------------------+ ICA Prox  134     39      40-59%  calcific and         Shadowing                                            irregular                               +----------+--------+--------+--------+---------------------+------------------+ ICA Mid   125     38                                                      +----------+--------+--------+--------+---------------------+------------------+ ICA Distal130     30                                                      +----------+--------+--------+--------+---------------------+------------------+ ECA  163     14                                                      +----------+--------+--------+--------+---------------------+------------------+ +----------+--------+-------+--------+-------------------+           PSV cm/sEDV cmsDescribeArm Pressure (mmHG)  +----------+--------+-------+--------+-------------------+ Subclavian102                                        +----------+--------+-------+--------+-------------------+ +---------+--------+--+--------+--+ VertebralPSV cm/s44EDV cm/s12 +---------+--------+--+--------+--+  Left Carotid Findings: +----------+--------+--------+--------+--------+------------------+           PSV cm/sEDV cm/sStenosisDescribeComments           +----------+--------+--------+--------+--------+------------------+ CCA Prox  135     28                      intimal thickening +----------+--------+--------+--------+--------+------------------+ CCA Distal81      28                      intimal thickening +----------+--------+--------+--------+--------+------------------+ ICA Prox  93      33              calcificShadowing          +----------+--------+--------+--------+--------+------------------+ ICA Distal78      25                                         +----------+--------+--------+--------+--------+------------------+ ECA       120     22                                         +----------+--------+--------+--------+--------+------------------+ +----------+--------+--------+--------+-------------------+ SubclavianPSV cm/sEDV cm/sDescribeArm Pressure (mmHG) +----------+--------+--------+--------+-------------------+           160                                         +----------+--------+--------+--------+-------------------+ +---------+--------+--+--------+--+ VertebralPSV cm/s55EDV cm/s14 +---------+--------+--+--------+--+  Summary: Right Carotid: Velocities in the right ICA are consistent with a 40-59%                stenosis. Left Carotid: Velocities in the left ICA are consistent with a 1-39% stenosis. Vertebrals:  Bilateral vertebral arteries demonstrate antegrade flow. Subclavians: Normal flow hemodynamics were seen in bilateral subclavian               arteries. *See table(s) above for measurements and observations.  Electronically signed by Antony Contras MD on 06/01/2018 at 8:53:18 AM.    Final         Scheduled Meds: . amiodarone  200 mg Oral BH-q7a  . atorvastatin  20 mg Oral Daily  . calcium-vitamin D  1 tablet Oral BID  . ferrous sulfate  325 mg Oral BID WC  . furosemide  10 mg Oral Daily  . gabapentin  300 mg Oral q morning - 10a  . gabapentin  600 mg Oral QPM  . levothyroxine  50 mcg Oral Q0600  . mometasone-formoterol  2 puff Inhalation BID  . multivitamin with minerals  1 tablet Oral Daily  . traZODone  25 mg Oral QHS  . vitamin B-12  1,000 mcg Oral Daily   Continuous Infusions: . heparin       LOS: 1 day     Vernell Leep, MD, FACP, [facility]. [facility]  To contact the attending provider between 7A-7P or the covering provider during after hours 7P-7A, please log into the web site [email or web address] and access using universal [place] password for that web site. If you do not have the password, please call the hospital operator.  06/01/2018, 3:31 PM

## 2018-06-02 DIAGNOSIS — I5032 Chronic diastolic (congestive) heart failure: Secondary | ICD-10-CM

## 2018-06-02 DIAGNOSIS — I480 Paroxysmal atrial fibrillation: Secondary | ICD-10-CM

## 2018-06-02 DIAGNOSIS — I4819 Other persistent atrial fibrillation: Secondary | ICD-10-CM

## 2018-06-02 LAB — HEPARIN LEVEL (UNFRACTIONATED)
Heparin Unfractionated: 0.55 IU/mL (ref 0.30–0.70)
Heparin Unfractionated: 0.33 IU/mL (ref 0.30–0.70)

## 2018-06-02 LAB — APTT
aPTT: 44 seconds — ABNORMAL HIGH (ref 24–36)
aPTT: 47 seconds — ABNORMAL HIGH (ref 24–36)
aPTT: 57 seconds — ABNORMAL HIGH (ref 24–36)

## 2018-06-02 LAB — CBC
HCT: 31.7 % — ABNORMAL LOW (ref 36.0–46.0)
Hemoglobin: 9.4 g/dL — ABNORMAL LOW (ref 12.0–15.0)
MCH: 29.3 pg (ref 26.0–34.0)
MCHC: 29.7 g/dL — ABNORMAL LOW (ref 30.0–36.0)
MCV: 98.8 fL (ref 80.0–100.0)
NRBC: 0 % (ref 0.0–0.2)
Platelets: 210 10*3/uL (ref 150–400)
RBC: 3.21 MIL/uL — ABNORMAL LOW (ref 3.87–5.11)
RDW: 19.9 % — ABNORMAL HIGH (ref 11.5–15.5)
WBC: 6.8 10*3/uL (ref 4.0–10.5)

## 2018-06-02 MED ORDER — CEFAZOLIN SODIUM-DEXTROSE 2-4 GM/100ML-% IV SOLN: 2.0000 g | INTRAVENOUS | Status: DC

## 2018-06-02 MED ORDER — TRAMADOL HCL 50 MG PO TABS: 50.0000 mg | ORAL_TABLET | Freq: Once | ORAL | Status: DC | PRN

## 2018-06-02 NOTE — Progress Notes (Signed)
ANTICOAGULATION CONSULT NOTE - Follow Up Consult  Pharmacy Consult for Heparin (Eliquis on hold) Indication: atrial fibrillation with new CVA  Allergies  Allergen Reactions  . Valium [Diazepam] Anxiety    Makes patient hyper  . Codeine Hives and Itching    Can take with Benadryl   . Darifenacin Itching    Can take with Benadryl  . Darvon [Propoxyphene Hcl] Itching    Can take with Benadryl  . Daypro [Oxaprozin] Itching    Can take with Benadryl  . Enablex [Darifenacin Hydrobromide Er] Itching    Can take with Benadryl  . Oxycodone Itching    Can take with Benadryl   . Propoxyphene Itching    Can take with Benadryl  . Risperdal [Risperidone] Itching    Can take with Benadryl  . Talwin [Pentazocine] Itching    Can take with Benadryl  . Vicodin [Hydrocodone-Acetaminophen] Itching    Can take with Benadryl     Patient Measurements: Height: 5\' 5"  (165.1 cm) Weight: 133 lb 6.1 oz (60.5 kg) IBW/kg (Calculated) : 57 Heparin Dosing Weight: 60.5 kg  Vital Signs: Temp: 98.3 F (36.8 C) (03/10 2100) Temp Source: Oral (03/10 2100) BP: 132/68 (03/10 2100) Pulse Rate: 68 (03/10 2100)  Labs: Recent Labs    05/31/18 1148 06/01/18 0458 06/01/18 2037  06/02/18 0258 06/02/18 1139 06/02/18 2003  HGB 9.1* 8.7*  --   --  9.4*  --   --   HCT 31.1* 29.9*  --   --  31.7*  --   --   PLT 207 200  --   --  210  --   --   APTT  --   --   --    < > 44* 47* 57*  HEPARINUNFRC  --   --  0.72*  --  0.55  --  0.33  CREATININE 0.80 0.64  --   --   --   --   --    < > = values in this interval not displayed.    Estimated Creatinine Clearance: 50.5 mL/min (by C-G formula based on SCr of 0.64 mg/dL).   Medications:  Infusions:  . [START ON 06/03/2018]  ceFAZolin (ANCEF) IV    . heparin 1,150 Units/hr (06/02/18 1725)    Assessment: 83 yo F on Eliquis PTA for hx afib.  Pt admitted with unsteadiness, increased slurred speech, and double vision.Shedid not receive IV t-PA due to late  presentation and Eliquis use.  Tentative plan for R CEA later this week.  Holding Eliquis for procedure.  Last Eliquis dose was 3/8 at 2136.  Expect levels are starting to correlate based on aPTT 57 and heparin level 0.33.  Will increase infusion rate slightly to target mid range.  Goal of Therapy:  Heparin level 0.3-0.5 units/ml aPTT 66-85 seconds Monitor platelets by anticoagulation protocol: Yes   Plan:  Increase heparin to 1200 units/hr. Next heparin level and aPTT with AM labs.  [employer], Pharm.D., BCPS Clinical Pharmacist Clinical phone for 06/02/2018 from 2:30-10:00 is [phone].  **Pharmacist phone directory can now be found on [email or web address] ([email or web address]).  Listed under [place].  06/02/2018 9:10 PM

## 2018-06-02 NOTE — Progress Notes (Signed)
ANTICOAGULATION CONSULT NOTE - Follow Up Consult  Pharmacy Consult for heparin Indication: atrial fibrillation in setting of acute CVA  Labs: Recent Labs    05/30/18 1413 05/31/18 1148 06/01/18 0458 06/01/18 2037 06/01/18 2100 06/02/18 0258  HGB 10.2* 9.1* 8.7*  --   --  9.4*  HCT 35.1* 31.1* 29.9*  --   --  31.7*  PLT 234 207 200  --   --  210  APTT 40*  --   --   --  36 44*  LABPROT 13.6  --   --   --   --   --   INR 1.1  --   --   --   --   --   HEPARINUNFRC  --   --   --  0.72*  --  0.55  CREATININE 1.19* 0.80 0.64  --   --   --     Assessment: 82yo female subtherapeutic on heparin with initial dosing while Eliquis on hold; no gtt issues or signs of bleeding per RN.  Goal of Therapy:  aPTT 66-85 seconds   Plan:  Will increase heparin gtt by 2-3 units/kg/hr to 1000 units/hr and PTTlevel in 8 hours.    Wynona Neat, PharmD, BCPS  06/02/2018,4:04 AM

## 2018-06-02 NOTE — Progress Notes (Signed)
Patient ID: Meredith Gonzalez, female   DOB: 13-Feb-1936, 83 y.o.   MRN: [id number]  Progress Note    06/02/2018 8:39 AM * No surgery date entered *  Subjective: No new neurologic deficits.   Vitals:   06/02/18 0333 06/02/18 0757  BP: (!) 161/66 (!) 169/68  Pulse: 64 66  Resp: 17 18  Temp: 98 F (36.7 C) 97.9 F (36.6 C)  SpO2: 98% 99%   Physical Exam: No change in slight left-sided weakness.  CBC    Component Value Date/Time   WBC 6.8 06/02/2018 0258   RBC 3.21 (L) 06/02/2018 0258   HGB 9.4 (L) 06/02/2018 0258   HGB 10.2 (L) 05/19/2018 1612   HCT 31.7 (L) 06/02/2018 0258   HCT 32.2 (L) 05/19/2018 1612   PLT 210 06/02/2018 0258   PLT 286 05/19/2018 1612   MCV 98.8 06/02/2018 0258   MCV 91 05/19/2018 1612   MCH 29.3 06/02/2018 0258   MCHC 29.7 (L) 06/02/2018 0258   RDW 19.9 (H) 06/02/2018 0258   RDW 16.8 (H) 05/19/2018 1612   LYMPHSABS 0.8 05/30/2018 1413   MONOABS 1.0 05/30/2018 1413   EOSABS 0.1 05/30/2018 1413   BASOSABS 0.0 05/30/2018 1413    BMET    Component Value Date/Time   NA 140 06/01/2018 0458   NA 144 05/19/2018 1612   K 3.8 06/01/2018 0458   CL 112 (H) 06/01/2018 0458   CO2 24 06/01/2018 0458   GLUCOSE 122 (H) 06/01/2018 0458   BUN 21 06/01/2018 0458   BUN 15 05/19/2018 1612   CREATININE 0.64 06/01/2018 0458   CALCIUM 7.9 (L) 06/01/2018 0458   GFRNONAA >60 06/01/2018 0458   GFRAA >60 06/01/2018 0458    INR    Component Value Date/Time   INR 1.1 05/30/2018 1413     Intake/Output Summary (Last 24 hours) at 06/02/2018 0839 Last data filed at 06/02/2018 0600 Gross per 24 hour  Intake 643.9 ml  Output -  Net 643.9 ml     Assessment/Plan:  83 y.o. female admitted with recurrent right brain event.  Well-known to me from consultation in February.  She has a moderate to severe extremely irregular right bifurcation plaque.  The feeling at that time by neurology that her symptoms were most likely related to atrial fibrillation.  Now with  recurrent right brain symptoms is felt that the carotid is much more likely to be the culprit lesion.  She is scheduled for right carotid endarterectomy tomorrow for reduction of stroke risk.  Understands the procedure and expected recovery and risk of stroke with surgery.     Rosetta Posner, MD FACS [facility] [phone] 06/02/2018 8:39 AM

## 2018-06-02 NOTE — H&P (Signed)
Physical Medicine and Rehabilitation Admission H&P    Chief Complaint  Patient presents with  . Functional decline due to stroke    HPI:  Meredith Gonzalez is an 83 year old female with history of CHF, HTN, cervical stenosis with chronic pain, T2DM, A fib s/p PPM, dyspnea likely due to COPD, CVA with recent CIR stay (d/c 05/07/18) 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 CTA head/neck negative for emergent findings and upto 60-65% with no wall abnormality. Carotid dopplers revealed R-40-59% ICA stenosis. Dr. Erlinda Hong felt that stroke was embolic likely due to A fib as patient with recurrent similar symptoms. Eliquis increased to 5 mg bid.  VVS consulted for input and recommended CEA due to "moderate to severe extremely irregular right bifurcation plaque".  She underwent R-CEA with dacron patch angioplasty by Dr. Donnetta Hutching on 06/03/18 and Eliquis resumed yesterday. Post op reporting issues with dysphagia. She continues to be limited by dizziness, ataxia with balance deficits and left sided weakness. Therapy ongoing and patient continues to be limited by    Review of Systems  Constitutional: Negative for chills and fever.  HENT: Negative for hearing loss and tinnitus.   Eyes: Positive for double vision. Negative for blurred vision.  Respiratory: Negative for cough and shortness of breath.   Cardiovascular: Negative for chest pain and palpitations.  Gastrointestinal: Negative for heartburn and nausea.       Sore throat/difficulty swallowing since yesterday  Genitourinary: Negative for dysuria and urgency.  Musculoskeletal: Positive for falls, myalgias and neck pain (due to fracture last fall).  Skin: Negative for itching and rash.  Neurological: Positive for dizziness, speech change, focal weakness and headaches.  Psychiatric/Behavioral: The patient does not have insomnia.      Past  Medical History:  Diagnosis Date  . Anemia   . Anxiety   . Arthritis    "all over" (02/11/2018)  . Basal cell carcinoma    "left leg" (02/11/2018)  . Cervical spine fracture ([place]) 12/2017   "C1-2"  . CHF (congestive heart failure) ([place])   . Chronic bronchitis ([place])   . Chronic neck pain    "since I broke my neck 6-8 wk ago" (02/11/2018)  . Diabetic peripheral neuropathy ([place])   . Fibromyalgia   . Fracture of right humerus   . Generalized weakness   . Headache    "weekly" (02/11/2018)  . History of blood transfusion    "related to one of my femur surgeries" (02/11/2018)  . History of echocardiogram    Echo 8/18: EF 50-55, no RWMA, Gr 1 DD, calcified AV leaflets, MAC, trivial MR, mod LAE, PASP 37  . Hypertension   . Hypothyroidism   . Incontinence of urine   . Ischemic stroke ([place]) 2000   "lost part of the vision in my right eye" (02/11/2018)  . Major depression, chronic   . Myocardial infarction ([place]) 06/2016  . Pacemaker   . Recurrent falls   . Syncope and collapse   . Type 2 diabetes, diet controlled ([place])     Past Surgical History:  Procedure Laterality Date  . ABDOMINAL HYSTERECTOMY    . ANKLE FRACTURE SURGERY Right   . BASAL CELL CARCINOMA EXCISION Left    "leg"  . CARDIAC CATHETERIZATION  ~ 2014  . CARDIOVERSION N/A 02/13/2018   Procedure: CARDIOVERSION;  Surgeon: Sueanne Margarita, MD;  Location: [facility];  Service: Cardiovascular;  Laterality:  N/A;  . CARDIOVERSION N/A 03/27/2018   Procedure: CARDIOVERSION;  Surgeon: Lelon Perla, MD;  Location: [facility] ENDOSCOPY;  Service: Cardiovascular;  Laterality: N/A;  . CARPAL TUNNEL RELEASE     bilateral  . CATARACT EXTRACTION W/ INTRAOCULAR LENS  IMPLANT, BILATERAL Bilateral   . CHOLECYSTECTOMY OPEN    . COLONOSCOPY    . CORONARY ANGIOPLASTY WITH STENT PLACEMENT  06/2016  . CORONARY STENT INTERVENTION N/A 07/19/2016   Procedure: Coronary Stent Intervention;  Surgeon: Peter M Martinique, MD;  Location: [facility] CV  LAB;  Service: Cardiovascular;  Laterality: N/A;  . ENDARTERECTOMY Right 06/03/2018   Procedure: ENDARTERECTOMY CAROTID;  Surgeon: Rosetta Posner, MD;  Location: [facility];  Service: Vascular;  Laterality: Right;  . EYE SURGERY     right eye catarace/lens implant  . FEMUR FRACTURE SURGERY Bilateral   . FRACTURE SURGERY    . GASTRIC BYPASS    . INSERT / REPLACE / REMOVE PACEMAKER  ~ 2012  . LEAD REVISION N/A 01/01/2013   Procedure: LEAD REVISION;  Surgeon: Evans Lance, MD;  Location: [facility] CATH LAB;  Service: Cardiovascular;  Laterality: N/A;  . LEFT HEART CATH AND CORONARY ANGIOGRAPHY N/A 07/19/2016   Procedure: Left Heart Cath and Coronary Angiography;  Surgeon: Peter M Martinique, MD;  Location: [facility] CV LAB;  Service: Cardiovascular;  Laterality: N/A;  . OVARIAN CYST SURGERY     "one side only"  . PATCH ANGIOPLASTY Right 06/03/2018   Procedure: PATCH ANGIOPLASTY USING HEMASHIELD PLATINUM FINESSE;  Surgeon: Rosetta Posner, MD;  Location: [facility];  Service: Vascular;  Laterality: Right;  . TEE WITHOUT CARDIOVERSION N/A 02/13/2018   Procedure: TRANSESOPHAGEAL ECHOCARDIOGRAM (TEE);  Surgeon: Sueanne Margarita, MD;  Location: [facility] ENDOSCOPY;  Service: Cardiovascular;  Laterality: N/A;  . TONSILLECTOMY      Family History  Problem Relation Age of Onset  . Congestive Heart Failure Mother   . Heart attack Father   . Alcoholism Father   . Alcoholism Sister   . Alcoholism Brother   . Alcoholism Brother   . Throat cancer Brother     Social History: Lives alone in [facility] facility. Retired Automotive engineer. Her son helps with home management and errands couple of times a week. She   reports that she quit smoking about 45 years ago. Her smoking use included cigarettes. She has a 2.00 pack-year smoking history. She has never used smokeless tobacco. She reports current alcohol use. She reports that she does not use drugs.   Allergies  Allergen Reactions  . Valium [Diazepam] Anxiety    Makes patient  hyper  . Codeine Hives and Itching    Can take with Benadryl   . Darifenacin Itching    Can take with Benadryl  . Darvon [Propoxyphene Hcl] Itching    Can take with Benadryl  . Daypro [Oxaprozin] Itching    Can take with Benadryl  . Enablex [Darifenacin Hydrobromide Er] Itching    Can take with Benadryl  . Oxycodone Itching    Can take with Benadryl   . Propoxyphene Itching    Can take with Benadryl  . Risperdal [Risperidone] Itching    Can take with Benadryl  . Talwin [Pentazocine] Itching    Can take with Benadryl  . Vicodin [Hydrocodone-Acetaminophen] Itching    Can take with Benadryl     Medications Prior to Admission  Medication Sig Dispense Refill  . acetaminophen (TYLENOL) 500 MG tablet Take 500 mg by mouth every 6 (six) hours  as needed for moderate pain.     Marland Kitchen albuterol (PROVENTIL HFA;VENTOLIN HFA) 108 (90 Base) MCG/ACT inhaler Inhale 2 puffs into the lungs every 6 (six) hours as needed for wheezing or shortness of breath. 1 Inhaler 1  . amiodarone (PACERONE) 200 MG tablet Take 1 tablet (200 mg total) by mouth every morning. (Patient taking differently: Take 200 mg by mouth daily. ) 90 tablet 3  . apixaban (ELIQUIS) 2.5 MG TABS tablet Take 1 tablet (2.5 mg total) by mouth 2 (two) times daily. 60 tablet 1  . atorvastatin (LIPITOR) 40 MG tablet Take 1 tablet (40 mg total) by mouth daily. 90 tablet 3  . budesonide-formoterol (SYMBICORT) 160-4.5 MCG/ACT inhaler Inhale 2 puffs into the lungs 2 (two) times daily. 1 Inhaler 5  . Calcium Citrate-Vitamin D3 315-250 MG-UNIT TABS Take 1 tablet by mouth 2 (two) times daily.    . Cholecalciferol (VITAMIN D3 PO) Take 1 tablet by mouth 2 (two) times daily.    . ferrous sulfate 325 (65 FE) MG tablet Take 1 tablet (325 mg total) by mouth 2 (two) times daily with a meal. 60 tablet 0  . flintstones complete (FLINTSTONES) 60 MG chewable tablet Chew 1 tablet by mouth daily.    . fluticasone (FLONASE) 50 MCG/ACT nasal spray Place 1 spray into  both nostrils daily as needed (allergies.).   4  . furosemide (LASIX) 20 MG tablet Take 0.5 tablets (10 mg total) by mouth daily. 30 tablet 1  . gabapentin (NEURONTIN) 300 MG capsule Take 1-2 capsules (300-600 mg total) by mouth See admin instructions. Take 1 capsule (300 mg) by mouth in the mornning & take 2 capsules (600 mg) by mouth at night (Patient taking differently: Take 300-600 mg by mouth See admin instructions. Take 1 capsule (300 mg) by mouth in the morning & take 2 capsules (600 mg) by mouth at night) 90 capsule 0  . levothyroxine (SYNTHROID, LEVOTHROID) 50 MCG tablet Take 1 tablet (50 mcg total) by mouth daily at 6 (six) AM for 30 days. 30 tablet 0  . vitamin B-12 (CYANOCOBALAMIN) 1000 MCG tablet Take 1 tablet (1,000 mcg total) by mouth daily. 30 tablet 0  . budesonide-formoterol (SYMBICORT) 160-4.5 MCG/ACT inhaler Inhale 2 puffs into the lungs 2 (two) times daily for 1 day. (Patient not taking: Reported on 05/30/2018) 1 Inhaler 0  . methocarbamol (ROBAXIN) 500 MG tablet Take 0.5 tablets (250 mg total) by mouth every 8 (eight) hours as needed for muscle spasms. (Patient not taking: Reported on 05/30/2018) 60 tablet 0  . nitroGLYCERIN (NITROSTAT) 0.4 MG SL tablet Place 1 tablet (0.4 mg total) under the tongue every 5 (five) minutes as needed for chest pain. (Patient not taking: Reported on 05/30/2018) 25 tablet 2    Drug Regimen Review  Drug regimen was reviewed and remains appropriate with no significant issues identified  Home: Home Living Family/patient expects to be discharged to:: Private residence Living Arrangements: Alone Available Help at Discharge: Family, Available PRN/intermittently Type of Home: House Home Access: Level entry Home Layout: One level Bathroom Shower/Tub: Walk-in shower(no threshold) Biochemist, clinical: Standard Bathroom Accessibility: Yes Home Equipment: Environmental consultant - 4 wheels, Environmental consultant - 2 wheels, Bedside commode, Shower seat - built in, [employer] -  tub/shower Additional Comments: Lives in [facility]", however son is making arrangements for her to potentially live in basement MIL suite  Lives With: Alone   Functional History: Prior Function Level of Independence: Independent with assistive device(s) Comments: used cane to help with balance  till got to shopping cart when grocery shopping; rollator in the home  Functional Status:  Mobility: Bed Mobility Overal bed mobility: Needs Assistance Bed Mobility: Supine to Sit, Sit to Supine Supine to sit: Supervision Sit to supine: Supervision General bed mobility comments: up in chair when PT arrived Transfers Overall transfer level: Needs assistance Equipment used: Rolling walker (2 wheeled) Transfers: Sit to/from Stand Sit to Stand: Min assist Stand pivot transfers: Min assist General transfer comment: min assist from chair after ex's and reminders every trial for hand placement Ambulation/Gait Ambulation/Gait assistance: Min assist Gait Distance (Feet): 40 Feet(x 2) Assistive device: Rolling walker (2 wheeled) Gait Pattern/deviations: Step-through pattern, Decreased stride length, Wide base of support(wide turns) General Gait Details: cautious gait, used RW with cues for setup and to sit,  Gait velocity: decreased    ADL: ADL Overall ADL's : Needs assistance/impaired Eating/Feeding: Set up, Sitting Grooming: Supervision/safety, Sitting Grooming Details (indicate cue type and reason): min guard for balance when standing, set up assistance when sitting Upper Body Bathing: Supervision/ safety, Sitting Lower Body Bathing: Minimal assistance, Sit to/from stand Lower Body Bathing Details (indicate cue type and reason): pt uses LH sponge at home Upper Body Dressing : Supervision/safety, Sitting Lower Body Dressing: Moderate assistance, Sit to/from stand Lower Body Dressing Details (indicate cue type and reason): pt uses reacher and other AD at home, unable to reach  feet Toilet Transfer: Minimal assistance, RW, Regular Toilet Toileting- Clothing Manipulation and Hygiene: Min guard, Sit to/from stand Functional mobility during ADLs: Minimal assistance, Rolling walker General ADL Comments: Pt limited by generalized weakness and balance deficits  Cognition: Cognition Overall Cognitive Status: Within Functional Limits for tasks assessed Arousal/Alertness: Awake/alert Orientation Level: Oriented X4 Attention: Focused, Sustained Focused Attention: Appears intact Sustained Attention: Appears intact Memory: Impaired Memory Impairment: Storage deficit, Retrieval deficit(Immediate: 3/3; Delayed: 2/3 ) Decreased Short Term Memory: Verbal complex Awareness: Appears intact Problem Solving: Appears intact Executive Function: Reasoning(2/2) Cognition Arousal/Alertness: Awake/alert Behavior During Therapy: WFL for tasks assessed/performed Overall Cognitive Status: Within Functional Limits for tasks assessed Area of Impairment: Safety/judgement, Problem solving Current Attention Level: Selective Safety/Judgement: Decreased awareness of deficits, Decreased awareness of safety Problem Solving: Slow processing General Comments: Increased processing time   Blood pressure 113/63, pulse 67, temperature 97.7 F (36.5 C), temperature source Oral, resp. rate 12, height _0  (1.651 m), weight 60.5 kg, SpO2 94 %. Physical Exam  Nursing note and vitals reviewed. Constitutional: She is oriented to person, place, and time. She appears well-developed and well-nourished.  HENT:  Head: Normocephalic and atraumatic.  Eyes: Pupils are equal, round, and reactive to light. EOM are normal.  Neck: Normal range of motion. No tracheal deviation present. No thyromegaly present.  Cardiovascular: Normal rate and regular rhythm. Exam reveals no friction rub.  No murmur heard. Respiratory: Effort normal. No respiratory distress. She has no wheezes.  GI: Soft. Bowel sounds are  normal. She exhibits no distension. There is no abdominal tenderness.  Musculoskeletal: Normal range of motion.  Neurological: She is alert and oriented to person, place, and time.  Mildly ataxic speech. Intermittent diplopia reported. LUE 3/5 prox to 4/5 distally with impaired FMC. LLE grossly 4/5. RUE and RLE 5/5. No focal sensory findings are appreciated. DTR's 3+ LUE and LLE and 2+ RUE/RLE. Mild left inattention, apraxia  Skin: Skin is warm and dry. She is not diaphoretic.  Psychiatric: She has a normal mood and affect. Her behavior is normal. Judgment and thought content normal.    Results for orders  placed or performed during the hospital encounter of 05/30/18 (from the past 48 hour(s))  Glucose, capillary     Status: Abnormal   Collection Time: 06/03/18  3:57 PM  Result Value Ref Range   Glucose-Capillary 100 (H) 70 - 99 mg/dL   Comment 1 Notify RN    Comment 2 Document in Chart   Heparin level (unfractionated)     Status: Abnormal   Collection Time: 06/03/18  4:30 PM  Result Value Ref Range   Heparin Unfractionated <0.10 (L) 0.30 - 0.70 IU/mL    Comment: REPEATED TO VERIFY (NOTE) If heparin results are below expected values, and patient dosage has  been confirmed, suggest follow up testing of antithrombin III levels. Performed at [facility], [place] [address] [place], [place] [address]   Glucose, capillary     Status: Abnormal   Collection Time: 06/03/18  8:53 PM  Result Value Ref Range   Glucose-Capillary 118 (H) 70 - 99 mg/dL  Basic metabolic panel     Status: Abnormal   Collection Time: 06/04/18  2:40 AM  Result Value Ref Range   Sodium 136 135 - 145 mmol/L   Potassium 4.2 3.5 - 5.1 mmol/L   Chloride 107 98 - 111 mmol/L   CO2 22 22 - 32 mmol/L   Glucose, Bld 215 (H) 70 - 99 mg/dL   BUN 18 8 - 23 mg/dL   Creatinine, Ser 0.79 0.44 - 1.00 mg/dL   Calcium 7.5 (L) 8.9 - 10.3 mg/dL   GFR calc non Af Amer >60 >60 mL/min   GFR calc Af Amer >60 >60 mL/min   Anion  gap 7 5 - 15    Comment: Performed at [facility], [place] [address] [place], [place] [address]  CBC     Status: Abnormal   Collection Time: 06/04/18  2:40 AM  Result Value Ref Range   WBC 5.8 4.0 - 10.5 K/uL   RBC 3.05 (L) 3.87 - 5.11 MIL/uL   Hemoglobin 8.8 (L) 12.0 - 15.0 g/dL   HCT 30.3 (L) 36.0 - 46.0 %   MCV 99.3 80.0 - 100.0 fL   MCH 28.9 26.0 - 34.0 pg   MCHC 29.0 (L) 30.0 - 36.0 g/dL   RDW 19.9 (H) 11.5 - 15.5 %   Platelets 222 150 - 400 K/uL   nRBC 0.0 0.0 - 0.2 %    Comment: Performed at [facility], [place] [address] [place], [place] [address]  Glucose, capillary     Status: Abnormal   Collection Time: 06/04/18  5:47 AM  Result Value Ref Range   Glucose-Capillary 194 (H) 70 - 99 mg/dL   No results found.     Medical Problem List and Plan: 1.  Functional and mobility deficits secondary to embolic right cortical infarct  -admit to inpatient rehab 2.  Antithrombotics: -DVT/anticoagulation:  Pharmaceutical: Other (comment)--Eliquis  -antiplatelet therapy: N/A 3. Chronic neck pain/Pain Management: On gabapentin tid with robaxin prn  -consider kpad if needed 4. Mood: LCSW to follow for evaluation and support.   -antipsychotic agents: N/A 5. Neuropsych: This patient is capable of making decisions on her own behalf. 6. Skin/Wound Care: Routine pressure relief measures.  7. Fluids/Electrolytes/Nutrition: Monitor I/O. Check lytes in am. 8. HTN: Monitor BP bid-on low dose lasix.  9. T2DM with peripheral neuropathy: Hgb A1c- 5.7. Diet controlled.  10 A fib s/p PPM: Monitor HR bid--continue amiodarone and Eliquis 11. DOE/Likely COPD: Recently started on Dulera. Resume albuterol  prn  12. R-CEA s/p CEA 3/11: Indicating dysphagia--will order ST.  13. Anemia: On iron for supplement       Bary Leriche, PA-C 06/05/2018

## 2018-06-02 NOTE — H&P (View-Only) (Signed)
Patient ID: Meredith Gonzalez, female   DOB: Mar 23, 1936, 83 y.o.   MRN: [id number]  Progress Note    06/02/2018 8:39 AM * No surgery date entered *  Subjective: No new neurologic deficits.   Vitals:   06/02/18 0333 06/02/18 0757  BP: (!) 161/66 (!) 169/68  Pulse: 64 66  Resp: 17 18  Temp: 98 F (36.7 C) 97.9 F (36.6 C)  SpO2: 98% 99%   Physical Exam: No change in slight left-sided weakness.  CBC    Component Value Date/Time   WBC 6.8 06/02/2018 0258   RBC 3.21 (L) 06/02/2018 0258   HGB 9.4 (L) 06/02/2018 0258   HGB 10.2 (L) 05/19/2018 1612   HCT 31.7 (L) 06/02/2018 0258   HCT 32.2 (L) 05/19/2018 1612   PLT 210 06/02/2018 0258   PLT 286 05/19/2018 1612   MCV 98.8 06/02/2018 0258   MCV 91 05/19/2018 1612   MCH 29.3 06/02/2018 0258   MCHC 29.7 (L) 06/02/2018 0258   RDW 19.9 (H) 06/02/2018 0258   RDW 16.8 (H) 05/19/2018 1612   LYMPHSABS 0.8 05/30/2018 1413   MONOABS 1.0 05/30/2018 1413   EOSABS 0.1 05/30/2018 1413   BASOSABS 0.0 05/30/2018 1413    BMET    Component Value Date/Time   NA 140 06/01/2018 0458   NA 144 05/19/2018 1612   K 3.8 06/01/2018 0458   CL 112 (H) 06/01/2018 0458   CO2 24 06/01/2018 0458   GLUCOSE 122 (H) 06/01/2018 0458   BUN 21 06/01/2018 0458   BUN 15 05/19/2018 1612   CREATININE 0.64 06/01/2018 0458   CALCIUM 7.9 (L) 06/01/2018 0458   GFRNONAA >60 06/01/2018 0458   GFRAA >60 06/01/2018 0458    INR    Component Value Date/Time   INR 1.1 05/30/2018 1413     Intake/Output Summary (Last 24 hours) at 06/02/2018 0839 Last data filed at 06/02/2018 0600 Gross per 24 hour  Intake 643.9 ml  Output -  Net 643.9 ml     Assessment/Plan:  83 y.o. female admitted with recurrent right brain event.  Well-known to me from consultation in February.  She has a moderate to severe extremely irregular right bifurcation plaque.  The feeling at that time by neurology that her symptoms were most likely related to atrial fibrillation.  Now with  recurrent right brain symptoms is felt that the carotid is much more likely to be the culprit lesion.  She is scheduled for right carotid endarterectomy tomorrow for reduction of stroke risk.  Understands the procedure and expected recovery and risk of stroke with surgery.     Rosetta Posner, MD FACS [facility] [phone] 06/02/2018 8:39 AM

## 2018-06-02 NOTE — TOC Initial Note (Signed)
Transition of Care [facility]) - Initial/Assessment Note    Patient Details  Name: Meredith Gonzalez MRN: [id number] Date of Birth: 1936-02-23  Transition of Care [facility]) CM/SW Contact:    Pollie Friar, RN Phone Number: 06/02/2018, 1:38 PM  Clinical Narrative:                   Expected Discharge Plan: IP Rehab Facility Barriers to Discharge: Continued Medical Work up   Patient Goals and CMS Choice Patient states their goals for this hospitalization and ongoing recovery are:: get back to independent living      Expected Discharge Plan and Services Expected Discharge Plan: [place] Discharge Planning Services: CM Consult   Living arrangements for the past 2 months: Independent Living Facility([facility] in an apartment)                          Prior Living Arrangements/Services Living arrangements for the past 2 months: Independent Living Facility([facility] in an apartment) Lives with:: Self Patient language and need for interpreter reviewed:: Yes(no needs) Do you feel safe going back to the place where you live?: Yes      Need for Family Participation in Patient Care: Yes (Comment)(therapies recommending 24 hour supervision) Care giver support system in place?: No (comment)(pt states she doesnt have 24 hour supervision at d/c.) Current home services: DME(cane, rollator, walker, 3 in 1, shower seat) Criminal Activity/Legal Involvement Pertinent to Current Situation/Hospitalization: No - Comment as needed  Activities of Daily Living Home Assistive Devices/Equipment: Eyeglasses ADL Screening (condition at time of admission) Patient's cognitive ability adequate to safely complete daily activities?: Yes Is the patient deaf or have difficulty hearing?: No Does the patient have difficulty seeing, even when wearing glasses/contacts?: No Does the patient have difficulty concentrating, remembering, or making decisions?: No Patient able to express need for  assistance with ADLs?: Yes Does the patient have difficulty dressing or bathing?: No Independently performs ADLs?: Yes (appropriate for developmental age) Does the patient have difficulty walking or climbing stairs?: No Weakness of Legs: None Weakness of Arms/Hands: None  Permission Sought/Granted                  Emotional Assessment Appearance:: Appears stated age Attitude/Demeanor/Rapport: Engaged Affect (typically observed): Accepting, Appropriate Orientation: : Oriented to Self, Oriented to Place, Oriented to  Time, Oriented to Situation Alcohol / Substance Use: Tobacco Use(in recent past) Psych Involvement: Yes (comment)(depression diagnosis)  Admission diagnosis:  Stroke-like symptom [R29.90] Stroke [facility]) [I63.9] Patient Active Problem List   Diagnosis Date Noted  . Stroke-like symptom   . Dyspnea   . Acute blood loss anemia   . Acute ischemic stroke ([place]) 05/31/2018  . Stroke ([place]) 05/30/2018  . Chronic diastolic congestive heart failure ([place])   . Anemia of chronic disease   . Hypoalbuminemia due to protein-calorie malnutrition ([place])   . Labile blood pressure   . Atrial fibrillation ([place])   . Chronic neck pain   . Subcortical infarction ([place]) 04/27/2018  . CVA (cerebral vascular accident) ([place]) 04/26/2018  . TIA (transient ischemic attack) 04/24/2018  . CAD (coronary artery disease) 08/07/2016  . (HFpEF) heart failure with preserved ejection fraction ([place]) 08/07/2016  . Ischemic cardiomyopathy 08/07/2016  . Chest pain   . NSTEMI (non-ST elevated myocardial infarction) ([place])   . Status post coronary artery stent placement   . Abnormal stress test   . SOB (shortness of breath)   . Multifocal pneumonia 07/13/2016  .  Elevated troponin 07/13/2016  . Leukocytosis   . Pacemaker 05/17/2014  . Complications, pacemaker cardiac, mechanical 12/31/2012  . Edema extremities 10/08/2012  . Persistent atrial fibrillation 10/08/2012  . Hypertension 10/08/2012  .  Hypothyroidism 10/08/2012  . Neuropathy 10/08/2012  . Diabetes ([place]) 10/08/2012  . Depression 09/08/2012  . Anemia 08/08/2012  . Knee fracture, left 08/07/2012  . Recurrent falls 08/07/2012  . Syncope 08/07/2012  . SDH (subdural hematoma) ([place]) 08/07/2012   PCP:  Sinclair Ship, MD Pharmacy:   [id number], [place] - [address] [place] [address] Phone: [phone] Fax: [phone]     Social Determinants of Health (SDOH) Interventions    Readmission Risk Interventions 30 Day Unplanned Readmission Risk Score     ED to Hosp-Admission (Current) from 05/30/2018 in [facility]  30 Day Unplanned Readmission Risk Score (%)  23 Filed at 06/02/2018 1200     This score is the patient's risk of an unplanned readmission within 30 days of being discharged (0 -100%). The score is based on dignosis, age, lab data, medications, orders, and past utilization.   Low:  0-14.9   Medium: 15-21.9   High: 22-29.9   Extreme: 30 and above       No flowsheet data found.

## 2018-06-02 NOTE — Progress Notes (Signed)
ANTICOAGULATION CONSULT NOTE - Follow Up Consult  Pharmacy Consult for Heparin Indication: Afib, CVA  Allergies  Allergen Reactions  . Valium [Diazepam] Anxiety    Makes patient hyper  . Codeine Hives and Itching    Can take with Benadryl   . Darifenacin Itching    Can take with Benadryl  . Darvon [Propoxyphene Hcl] Itching    Can take with Benadryl  . Daypro [Oxaprozin] Itching    Can take with Benadryl  . Enablex [Darifenacin Hydrobromide Er] Itching    Can take with Benadryl  . Oxycodone Itching    Can take with Benadryl   . Propoxyphene Itching    Can take with Benadryl  . Risperdal [Risperidone] Itching    Can take with Benadryl  . Talwin [Pentazocine] Itching    Can take with Benadryl  . Vicodin [Hydrocodone-Acetaminophen] Itching    Can take with Benadryl     Patient Measurements: Height: 5\' 5"  (165.1 cm) Weight: 133 lb 6.1 oz (60.5 kg) IBW/kg (Calculated) : 57 Heparin Dosing Weight:  60.5 kg  Vital Signs: Temp: 98 F (36.7 C) (03/10 1201) Temp Source: Oral (03/10 1201) BP: 158/71 (03/10 1201) Pulse Rate: 78 (03/10 1201)  Labs: Recent Labs    05/30/18 1413 05/31/18 1148 06/01/18 0458 06/01/18 2037 06/01/18 2100 06/02/18 0258 06/02/18 1139  HGB 10.2* 9.1* 8.7*  --   --  9.4*  --   HCT 35.1* 31.1* 29.9*  --   --  31.7*  --   PLT 234 207 200  --   --  210  --   APTT 40*  --   --   --  36 44* 47*  LABPROT 13.6  --   --   --   --   --   --   INR 1.1  --   --   --   --   --   --   HEPARINUNFRC  --   --   --  0.72*  --  0.55  --   CREATININE 1.19* 0.80 0.64  --   --   --   --     Estimated Creatinine Clearance: 50.5 mL/min (by C-G formula based on SCr of 0.64 mg/dL).   Assessment:  Anticoag:  h/o nonvalvular afib on Eliquis PTA (2.5mg  BID). CHADS2VASC=9.  age 83,  wt 60.5 kg, Scr <1.5 Hgb 9.4, ptlc 210 - 3/9: Tentative plan for R CEA Wednesday with Dr. Donnetta Hutching or Thursday with me Carlis Abbott).  Please hold Eliquis starting this morning - heparin  bridge if needed ok--per vascular 3/9. - aPTT 47 remains low. RN notes infusion has been occluded several times.  Goal of Therapy:  Heparin level 0.3-0.7 units/ml  APTT 66-85 Monitor platelets by anticoagulation protocol: Yes   Plan:  Hold Eliquis starting 3/9 IV heparin (no bolus) at 1150 units/hr Daily HL and CBC Plan R CEA 3/11   Teandra Harlan S. Alford Highland, PharmD, [place] Clinical Staff Pharmacist Eilene Ghazi Stillinger 06/02/2018,1:18 PM

## 2018-06-02 NOTE — Progress Notes (Signed)
PROGRESS NOTE   Meredith Gonzalez  IRW:[id number]    DOB: 18-Jan-1936    DOA: 05/30/2018  PCP: Sinclair Ship, MD   I have briefly reviewed patients previous medical [facility] in [facility].  Brief Narrative:  83 year old female, lives alone, ambulates with the help of a Rollator or walker, PMH of DM 2, HTN, hypothyroid, A. fib on Eliquis, CAD/MI, chronic diastolic CHF, PPM, CVA, anxiety and depression, presented to [facility] due to slurred speech, double vision, left-sided weakness and a fall that started on 05/28/2018.  Admitted for possible small right brain stroke due to symptomatic right ICA stenosis.  Neurology and vascular surgery consulted.  Tentatively planned for right CEA on 3/11 or 3/12.  CIR consulted.  Eliquis held (last dose on 3/8 at 9:36 PM).   Assessment & Plan:   Principal Problem:   Stroke [facility]) Active Problems:   Anemia   Persistent atrial fibrillation   Hypertension   Hypothyroidism   Diabetes ([place])   Pacemaker   CAD (coronary artery disease)   Chronic diastolic congestive heart failure (HCC)   Acute ischemic stroke (HCC)   Stroke-like symptom   Dyspnea   Acute blood loss anemia   1. Possible small right brain stroke: Neurology consulted and assisted with evaluation and management.  Resultant dysarthria, diplopia and left hemiparesis (left lower extremity weaker).  CT head: No acute intracranial pathology.  Small vessel white matter disease.  Redemonstrated fractures of the dens and C1 vertebral body.  CTA of the head and neck: No emergent finding or change from January 2020.  Cervical carotid atherosclerosis with up to 60% stenosis at the right ICA bulb.  Chronic lung disease.  Unable to do MRI due to PPM.  Thereby repeated CT head 3/8: No appreciable infarct or intracranial hemorrhage.  TTE: LVEF 60-65%.  Carotid Doppler: Right 40-59% ICA stenosis.  Left 1-39% ICA stenosis.  LDL 28.  A1c 5.7.  As per stroke MD follow-up (discussed with Dr. Erlinda Hong) >patient  presentation concerning for recurrent stroke due to symptomatic right ICA stenosis versus A. fib on lower dose of Eliquis with which she is compliant.  She has had 2 admissions with similar presentations of left-sided weakness.  Patient was on Eliquis 2.5 mg twice daily prior to admission, now on Eliquis 5 mg twice daily, now held for procedure.  Vascular surgery input appreciated and plan right CEA 3/11 or 3/12.  During previous stroke event, etiology felt to be due to A. fib but now with recurrent stroke with similar presentation, carotid stenosis felt to be the likely culprit. 2. Right carotid stenosis: During previous admission, vascular surgery was consulted, right CEA was contemplated but eventually not performed because posterior circulation CVA could not be ruled out (no MRI) and stroke was also felt to be due to A. fib not on anticoagulation.  She had then been discharged on Eliquis 2.5 mg twice daily.  As per neurology, given recurrent presentation with similar symptoms, suspect symptomatic right ICA stenosis.  Vascular surgery input appreciated and plan right CEA for 3/11.  I requested Cardiology for preop clearance and they have determined that she is at acceptable risk for surgery and no further preoperative cardiac work-up necessary.  I discussed with cardiology. 3. Paroxysmal A. fib: Patient started Eliquis after last stroke in January 2020.  She was on 2.5 mg twice daily.  Given recurrent stroke and room for increase, Eliquis dose increased to 5 mg twice daily.  She is A paced or sinus  rhythm on telemetry.  Due to upcoming CEA, Eliquis held, last dose 3/8 at 9:36 PM.  As per VVS and discussion with Dr. Erlinda Hong, started IV heparin bridge that is to be held several hours prior to surgery. 4. Type II DM: A1c 5.7, goal <7. 5. Hyperlipidemia: LDL 28, goal <70.  Atorvastatin reduced from 40 mg daily to 20 mg daily. 6. Essential hypertension: Allow permissive hypertension.  Currently controlled off of  meds. 7. CAD: No anginal symptoms. 8. Chronic diastolic CHF: TTE results as above.  Continue home dose of furosemide 10 mg daily.  Compensated. 9. Status post PPM: 10. Fracture of dens and C1 vertebral body: Noted on CT head, stable from CT spine 12/2017.  Outpatient follow-up with Dr. Vertell Limber, neurosurgery. 11. Anemia: Hemoglobin stable.  Follow CBC. 12. Hypokalemia: Replaced 13. Possible COPD: Seen by pulmonology on 2/28, had abnormal PFTs and has been scheduled to get high resolution CT on 3/12.  Started on Symbicort.    DVT prophylaxis: Eliquis-currently on hold.  IV heparin bridge. Code Status: DNR Family Communication: None at bedside today. Disposition: To be determined pending right CEA.  CIR following.   Consultants:  Neurology Vascular surgery  Neurology  Procedures:  None  Antimicrobials:  None   Subjective: Overall patient feels much better.  Did not complain of diplopia.  Left leg strength continues to improve.  ROS: As above, otherwise negative.  Objective:  Vitals:   06/02/18 0333 06/02/18 0757 06/02/18 0841 06/02/18 1201  BP: (!) 161/66 (!) 169/68  (!) 158/71  Pulse: 64 66 68 78  Resp: 17 18 18 16   Temp: 98 F (36.7 C) 97.9 F (36.6 C)  98 F (36.7 C)  TempSrc: Oral Oral  Oral  SpO2: 98% 99% 94% 98%  Weight:      Height:        Examination:  General exam: Pleasant elderly female, small built and nourished seen ambulating with PT and did not appear in any discomfort. Respiratory system: Clear to auscultation.  No increased work of breathing.  Stable. Cardiovascular system: S1 & S2 heard, RRR. No JVD, murmurs, rubs, gallops or clicks. No pedal edema.  Telemetry personally reviewed: AV paced rhythm.  Stable. Gastrointestinal system: Abdomen is nondistended, soft and nontender. No organomegaly or masses felt. Normal bowel sounds heard.  Stable. Central nervous system: Alert and oriented. No focal neurological deficits.  Stable without  change. Extremities: 5 x 5 power in right extremities and grade 4 x 5 power in left extremities.  Stable without change. Skin: Patient has couple of small bruises on her back. Psychiatry: Judgement and insight appear normal. Mood & affect appropriate.     Data Reviewed: I have personally reviewed following labs and imaging studies  CBC: Recent Labs  Lab 05/30/18 1413 05/31/18 1148 06/01/18 0458 06/02/18 0258  WBC 10.7* 6.5 6.3 6.8  NEUTROABS 8.8*  --   --   --   HGB 10.2* 9.1* 8.7* 9.4*  HCT 35.1* 31.1* 29.9* 31.7*  MCV 100.6* 99.7 100.0 98.8  PLT 234 207 200 474   Basic Metabolic Panel: Recent Labs  Lab 05/30/18 1413 05/31/18 1148 06/01/18 0458  NA 137 141 140  K 3.6 3.4* 3.8  CL 108 113* 112*  CO2 22 23 24   GLUCOSE 104* 100* 122*  BUN 29* 18 21  CREATININE 1.19* 0.80 0.64  CALCIUM 8.2* 8.1* 7.9*   Liver Function Tests: Recent Labs  Lab 05/30/18 1413 05/31/18 1148  AST 19 17  ALT 18 16  ALKPHOS 102 90  BILITOT 0.7 0.7  PROT 7.3 5.8*  ALBUMIN 3.4* 2.6*   Coagulation Profile: Recent Labs  Lab 05/30/18 1413  INR 1.1   HbA1C: Recent Labs    05/31/18 1148  HGBA1C 5.7*   CBG: Recent Labs  Lab 05/30/18 1410  GLUCAP 87         Radiology Studies: No results found.      Scheduled Meds: . amiodarone  200 mg Oral BH-q7a  . atorvastatin  20 mg Oral Daily  . calcium-vitamin D  1 tablet Oral BID  . ferrous sulfate  325 mg Oral BID WC  . furosemide  10 mg Oral Daily  . gabapentin  300 mg Oral q morning - 10a  . gabapentin  600 mg Oral QPM  . levothyroxine  50 mcg Oral Q0600  . mometasone-formoterol  2 puff Inhalation BID  . multivitamin with minerals  1 tablet Oral Daily  . traZODone  25 mg Oral QHS  . vitamin B-12  1,000 mcg Oral Daily   Continuous Infusions: . [START ON 06/03/2018]  ceFAZolin (ANCEF) IV    . heparin 1,000 Units/hr (06/02/18 0355)     LOS: 2 days     Vernell Leep, MD, FACP, [facility]. [facility]  To  contact the attending provider between 7A-7P or the covering provider during after hours 7P-7A, please log into the web site [email or web address] and access using universal [place] password for that web site. If you do not have the password, please call the hospital operator.  06/02/2018, 2:38 PM

## 2018-06-02 NOTE — Consult Note (Signed)
Cardiology Consultation:   Patient ID: Meredith Gonzalez MRN: [id number]; DOB: 1935-04-06  Admit date: 05/30/2018 Date of Consult: 06/02/2018  Primary Care Provider: Sinclair Ship, MD Primary Cardiologist: Dorris Carnes, MD  Primary Electrophysiologist:  Cristopher Peru, MD    Patient Profile:   Meredith Gonzalez is a 83 y.o. female with a hx of  AFib, s/p fall resulting in subdural hematoma, HTN, hypothyroidism, s/p pacemaker (RV lead revision done in 2014),coronary artery diseases/p NSTEMI in 4/18 treated with a DES to the Dx, chronic diastolic CHF,syncope felt to be related to autonomic dysfunction and recent CVA who is being seen today for the evaluation of Pre-op clearance at the request of Dr. Vladimir Crofts.  She was previouslynot felt to bea candidate for anticoagulationdue to prior fallswith subdural hematoma. Prior tilt table test was positive and she was changed from Diltiazem to Pindolol.She had a fall and fractured her c-spine in 12/2017.  She was admitted in November 2019 with atrial fibrillation with rapid rate complicated by decompensated heart failure.  She was placed on apixaban for anticoagulation.  Amiodarone was started cardioversion was arranged after adequate loading.  She underwent successful cardioversion 03/27/2018.    She was doing well on cardiac stand point when Last seen by Richardson Dopp 04/10/2018.  History of Present Illness:   [name] was admitted 04/2018 for stroke thought to be posterior circulation 2nd to afib. Also had moderate to severe right internal carotid artery stenosis. Plan to follow with ultrasound annually. However presented 05/30/18 with 2 days hx of slurred speech, double vision, and left-sided weakness. Work up revealed possible small right brain stroke due to right ICA stenosis. Due to recurrent stroke, it is now felt that culprit likely carotid disease and plan for  right carotid endarterectomy 3/11  for reduction of stroke risk.  Cardiology is asked for pre-op  clearance.   Echo this admission 05/31/18 showed normal pumping function of heart. No WM abnormality. Mildly elevated RV pressure.   She uses walker at home. Does house hold chores without any issue. No outside activity. Denies chest pain, dyspnea, palpitations, orthopnea, PND, dizziness or syncope. Take lasix for LE ankle edema.    Past Medical History:  Diagnosis Date  . Anemia   . Anxiety   . Arthritis    "all over" (02/11/2018)  . Basal cell carcinoma    "left leg" (02/11/2018)  . Cervical spine fracture ([place]) 12/2017   "C1-2"  . CHF (congestive heart failure) ([place])   . Chronic bronchitis ([place])   . Chronic neck pain    "since I broke my neck 6-8 wk ago" (02/11/2018)  . Diabetic peripheral neuropathy ([place])   . Fibromyalgia   . Fracture of right humerus   . Generalized weakness   . Headache    "weekly" (02/11/2018)  . History of blood transfusion    "related to one of my femur surgeries" (02/11/2018)  . History of echocardiogram    Echo 8/18: EF 50-55, no RWMA, Gr 1 DD, calcified AV leaflets, MAC, trivial MR, mod LAE, PASP 37  . Hypertension   . Hypothyroidism   . Incontinence of urine   . Ischemic stroke ([place]) 2000   "lost part of the vision in my right eye" (02/11/2018)  . Major depression, chronic   . Myocardial infarction ([place]) 06/2016  . Pacemaker   . Recurrent falls   . Syncope and collapse   . Type 2 diabetes, diet controlled ([place])     Past Surgical History:  Procedure Laterality Date  .  ABDOMINAL HYSTERECTOMY    . ANKLE FRACTURE SURGERY Right   . BASAL CELL CARCINOMA EXCISION Left    "leg"  . CARDIAC CATHETERIZATION  ~ 2014  . CARDIOVERSION N/A 02/13/2018   Procedure: CARDIOVERSION;  Surgeon: Sueanne Margarita, MD;  Location[facility] ENDOSCOPY;  Service: Cardiovascular;  Laterality: N/A;  . CARDIOVERSION N/A 03/27/2018   Procedure: CARDIOVERSION;  Surgeon: Lelon Perla, MD;  Location: [facility] ENDOSCOPY;  Service: Cardiovascular;  Laterality: N/A;  . CARPAL TUNNEL  RELEASE     bilateral  . CATARACT EXTRACTION W/ INTRAOCULAR LENS  IMPLANT, BILATERAL Bilateral   . CHOLECYSTECTOMY OPEN    . COLONOSCOPY    . CORONARY ANGIOPLASTY WITH STENT PLACEMENT  06/2016  . CORONARY STENT INTERVENTION N/A 07/19/2016   Procedure: Coronary Stent Intervention;  Surgeon: Peter M Martinique, MD;  Location: [place] CV LAB;  Service: Cardiovascular;  Laterality: N/A;  . EYE SURGERY     right eye catarace/lens implant  . FEMUR FRACTURE SURGERY Bilateral   . FRACTURE SURGERY    . GASTRIC BYPASS    . INSERT / REPLACE / REMOVE PACEMAKER  ~ 2012  . LEAD REVISION N/A 01/01/2013   Procedure: LEAD REVISION;  Surgeon: Evans Lance, MD;  Location: [facility] CATH LAB;  Service: Cardiovascular;  Laterality: N/A;  . LEFT HEART CATH AND CORONARY ANGIOGRAPHY N/A 07/19/2016   Procedure: Left Heart Cath and Coronary Angiography;  Surgeon: Peter M Martinique, MD;  Location: [place] CV LAB;  Service: Cardiovascular;  Laterality: N/A;  . OVARIAN CYST SURGERY     "one side only"  . TEE WITHOUT CARDIOVERSION N/A 02/13/2018   Procedure: TRANSESOPHAGEAL ECHOCARDIOGRAM (TEE);  Surgeon: Sueanne Margarita, MD;  Location: [facility] ENDOSCOPY;  Service: Cardiovascular;  Laterality: N/A;  . TONSILLECTOMY       Inpatient Medications: Scheduled Meds: . amiodarone  200 mg Oral BH-q7a  . atorvastatin  20 mg Oral Daily  . calcium-vitamin D  1 tablet Oral BID  . ferrous sulfate  325 mg Oral BID WC  . furosemide  10 mg Oral Daily  . gabapentin  300 mg Oral q morning - 10a  . gabapentin  600 mg Oral QPM  . levothyroxine  50 mcg Oral Q0600  . mometasone-formoterol  2 puff Inhalation BID  . multivitamin with minerals  1 tablet Oral Daily  . traZODone  25 mg Oral QHS  . vitamin B-12  1,000 mcg Oral Daily   Continuous Infusions: . [START ON 06/03/2018]  ceFAZolin (ANCEF) IV    . heparin 1,000 Units/hr (06/02/18 0355)   PRN Meds: acetaminophen **OR** acetaminophen (TYLENOL) oral liquid 160 mg/5 mL **OR**  acetaminophen, albuterol, fluticasone, methocarbamol, nitroGLYCERIN, senna-docusate  Allergies:    Allergies  Allergen Reactions  . Valium [Diazepam] Anxiety    Makes patient hyper  . Codeine Hives and Itching    Can take with Benadryl   . Darifenacin Itching    Can take with Benadryl  . Darvon [Propoxyphene Hcl] Itching    Can take with Benadryl  . Daypro [Oxaprozin] Itching    Can take with Benadryl  . Enablex [Darifenacin Hydrobromide Er] Itching    Can take with Benadryl  . Oxycodone Itching    Can take with Benadryl   . Propoxyphene Itching    Can take with Benadryl  . Risperdal [Risperidone] Itching    Can take with Benadryl  . Talwin [Pentazocine] Itching    Can take with Benadryl  . Vicodin [Hydrocodone-Acetaminophen] Itching    Can  take with Benadryl     Social History:   Social History   Socioeconomic History  . Marital status: Widowed    Spouse name: Not on file  . Number of children: Not on file  . Years of education: Not on file  . Highest education level: Not on file  Occupational History  . Not on file  Social Needs  . Financial resource strain: Not hard at all  . Food insecurity:    Worry: Never true    Inability: Never true  . Transportation needs:    Medical: No    Non-medical: No  Tobacco Use  . Smoking status: Former Smoker    Packs/day: 0.50    Years: 4.00    Pack years: 2.00    Types: Cigarettes    Last attempt to quit: 08/07/1972    Years since quitting: 45.8  . Smokeless tobacco: Never Used  Substance and Sexual Activity  . Alcohol use: Yes    Frequency: Never    Comment: once or twice a year  . Drug use: Never  . Sexual activity: Not on file  Lifestyle  . Physical activity:    Days per week: 0 days    Minutes per session: 0 min  . Stress: Only a little  Relationships  . Social connections:    Talks on phone: Three times a week    Gets together: Three times a week    Attends religious service: More than 4 times per year     Active member of club or organization: No    Attends meetings of clubs or organizations: Never    Relationship status: Widowed  . Intimate partner violence:    Fear of current or ex partner: No    Emotionally abused: No    Physically abused: No    Forced sexual activity: No  Other Topics Concern  . Not on file  Social History Narrative  . Not on file    Family History:    Family History  Problem Relation Age of Onset  . Congestive Heart Failure Mother   . Heart attack Father   . Alcoholism Father   . Alcoholism Sister   . Alcoholism Brother   . Alcoholism Brother   . Throat cancer Brother      ROS:  Please see the history of present illness.  All other ROS reviewed and negative.     Physical Exam/Data:   Vitals:   06/01/18 2347 06/02/18 0333 06/02/18 0757 06/02/18 0841  BP: (!) 136/55 (!) 161/66 (!) 169/68   Pulse: 68 64 66 68  Resp: _0 Temp: 98.1 F (36.7 C) 98 F (36.7 C) 97.9 F (36.6 C)   TempSrc: Oral Oral Oral   SpO2: 97% 98% 99% 94%  Weight:      Height:        Intake/Output Summary (Last 24 hours) at 06/02/2018 1048 Last data filed at 06/02/2018 0600 Gross per 24 hour  Intake 323.9 ml  Output -  Net 323.9 ml   Last 3 Weights 05/30/2018 05/20/2018 05/19/2018  Weight (lbs) 133 lb 6.1 oz 132 lb 6.4 oz 130 lb  Weight (kg) 60.5 kg 60.056 kg 58.968 kg     Body mass index is 22.2 kg/m.  General:  Well nourished, well developed, in no acute distress HEENT: normal Lymph: no adenopathy Neck: no JVD Endocrine:  No thryomegaly Vascular: No carotid bruits; FA pulses 2+ bilaterally without bruits  Cardiac:  normal S1, S2;  RRR; no murmur  Lungs:  clear to auscultation bilaterally, no wheezing, rhonchi or rales  Abd: soft, nontender, no hepatomegaly  Ext: no edema Musculoskeletal:  No deformities Skin: warm and dry  Neuro:   no focal abnormalities noted, LLE weakness Psych:  Normal affect   EKG:  The EKG was personally reviewed and  demonstrates:  A paced rhythm at rate of 71 bpm Telemetry:  Telemetry was personally reviewed and demonstrates:  A paced rhythm at controlled rate  Relevant CV Studies:  Echo 05/31/2018 IMPRESSIONS    1. Limited study.  2. The left ventricle has normal systolic function, with an ejection fraction of 60-65%. The cavity size was normal. No evidence of left ventricular regional wall motion abnormalities. Diastolic function was not assessed.  3. The right ventricle has normal systolc function. The cavity was normal. There is no increase in right ventricular wall thickness. Right ventricular systolic pressure is mildly elevated with an estimated pressure of 38.3 mmHg. Device wire present.  4. Left atrial size was mildly dilated.  5. The aortic valve is tricuspid Mild aortic annular calcification noted.  6. The mitral valve is normal in structure. There is mild mitral annular calcification present.  7. The tricuspid valve was normal in structure.  8. The aortic root is normal in size and structure.  Laboratory Data:  Chemistry Recent Labs  Lab 05/30/18 1413 05/31/18 1148 06/01/18 0458  NA 137 141 140  K 3.6 3.4* 3.8  CL 108 113* 112*  CO2 _0 GLUCOSE 104* 100* 122*  BUN 29* 18 21  CREATININE 1.19* 0.80 0.64  CALCIUM 8.2* 8.1* 7.9*  GFRNONAA 43* >60 >60  GFRAA 50* >60 >60  ANIONGAP 7 5 4*    Recent Labs  Lab 05/30/18 1413 05/31/18 1148  PROT 7.3 5.8*  ALBUMIN 3.4* 2.6*  AST 19 17  ALT 18 16  ALKPHOS 102 90  BILITOT 0.7 0.7   Hematology Recent Labs  Lab 05/31/18 1148 06/01/18 0458 06/02/18 0258  WBC 6.5 6.3 6.8  RBC 3.12* 2.99* 3.21*  HGB 9.1* 8.7* 9.4*  HCT 31.1* 29.9* 31.7*  MCV 99.7 100.0 98.8  MCH 29.2 29.1 29.3  MCHC 29.3* 29.1* 29.7*  RDW 20.4* 20.3* 19.9*  PLT 207 200 210   Cardiac EnzymesNo results for input(s): TROPONINI in the last 168 hours. No results for input(s): TROPIPOC in the last 168 hours.  BNPNo results for input(s): BNP, PROBNP in  the last 168 hours.  DDimer No results for input(s): DDIMER in the last 168 hours.  Radiology/Studies:  Ct Angio Head W Or Wo Contrast  Result Date: 05/30/2018 CLINICAL DATA:  Left-sided weakness and ataxia EXAM: CT ANGIOGRAPHY HEAD AND NECK TECHNIQUE: Multidetector CT imaging of the head and neck was performed using the standard protocol during bolus administration of intravenous contrast. Multiplanar CT image reconstructions and MIPs were obtained to evaluate the vascular anatomy. Carotid stenosis measurements (when applicable) are obtained utilizing NASCET criteria, using the distal internal carotid diameter as the denominator. CONTRAST:  85m ISOVUE-370 IOPAMIDOL (ISOVUE-370) INJECTION 76% COMPARISON:  CTA head neck 04/24/2018 FINDINGS: CTA NECK FINDINGS Aortic arch: Atherosclerotic plaque.  Three vessel branching. Right carotid system: Bulky mixed density plaque at the ICA bulb stenosis measuring up to 60%. No ulceration or beading. Left carotid system: Calcified plaque without flow limiting stenosis or ulceration. Vertebral arteries: Proximal subclavian atherosclerotic calcification without stenosis. Both vertebral arteries are smooth and widely patent to the dura. Dominant left vertebral artery. Skeleton: Remote C1  ring fracture known from 2019 imaging. No fracture displacement. Advanced cervical spine degeneration. Other neck: No significant incidental finding Upper chest: Patchy streaky density in the bilateral apex was also seen previously, compatible with scarring or pneumonitis. Review of the MIP images confirms the above findings CTA HEAD FINDINGS Anterior circulation: Atherosclerotic calcification of the carotid siphons. No branch occlusion, flow limiting stenosis, beading, or aneurysm. Posterior circulation: Left dominant vertebral artery. The vertebral and basilar arteries are smooth and widely patent. No branch occlusion or flow limiting stenosis. Negative for aneurysm. Venous sinuses: Patent  Anatomic variants: None significant Delayed phase: No abnormal intracranial enhancement Review of the MIP images confirms the above findings IMPRESSION: 1. No emergent finding or change from January 2020. 2. Cervical carotid atherosclerosis with up to 60% stenosis at the right ICA bulb. 3. Chronic lung disease. Electronically Signed   By: Monte Fantasia M.D.   On: 05/30/2018 15:32   Ct Head Wo Contrast  Result Date: 05/31/2018 CLINICAL DATA:  Stroke follow-up this eat (left-sided weakness and ataxia EXAM: CT HEAD WITHOUT CONTRAST TECHNIQUE: Contiguous axial images were obtained from the base of the skull through the vertex without intravenous contrast. COMPARISON:  05/30/2018 FINDINGS: Brain: No evidence of acute infarction, hemorrhage, hydrocephalus, extra-axial collection or mass lesion/mass effect. Mild for age chronic small vessel ischemia in the cerebral white matter. Age congruent cerebral volume loss. 2 small sulcal or cortical calcification in the right parietal region that is stable from at least 12/28/2017 Vascular: Atherosclerotic calcification Skull: Remote C1 ring fractures with unchanged alignment on cervical CT yesterday. Sinuses/Orbits: Bilateral cataract resection IMPRESSION: No appreciable infarct or intracranial hemorrhage. Electronically Signed   By: Monte Fantasia M.D.   On: 05/31/2018 13:12   Ct Head Wo Contrast  Result Date: 05/30/2018 CLINICAL DATA:  Slurred speech, ataxia, left-sided weakness EXAM: CT HEAD WITHOUT CONTRAST TECHNIQUE: Contiguous axial images were obtained from the base of the skull through the vertex without intravenous contrast. COMPARISON:  05/15/2018, CT neck, 04/24/2018 FINDINGS: Brain: No evidence of acute infarction, hemorrhage, hydrocephalus, extra-axial collection or mass lesion/mass effect. Periventricular white matter hypodensity. Vascular: No hyperdense vessel or unexpected calcification. Skull: Normal. Negative for fracture or focal lesion. Sinuses/Orbits:  No acute finding. Other: Redemonstrated fractures of the dens and C1 vertebral body, better evaluated by prior CT examination of the neck. IMPRESSION: 1. No acute intracranial pathology. Small-vessel white matter disease. 2. Redemonstrated fractures of the dens and C1 vertebral body, better evaluated by prior CT examination of the neck. Electronically Signed   By: Eddie Candle M.D.   On: 05/30/2018 15:11   Ct Angio Neck W And/or Wo Contrast  Result Date: 05/30/2018 CLINICAL DATA:  Left-sided weakness and ataxia EXAM: CT ANGIOGRAPHY HEAD AND NECK TECHNIQUE: Multidetector CT imaging of the head and neck was performed using the standard protocol during bolus administration of intravenous contrast. Multiplanar CT image reconstructions and MIPs were obtained to evaluate the vascular anatomy. Carotid stenosis measurements (when applicable) are obtained utilizing NASCET criteria, using the distal internal carotid diameter as the denominator. CONTRAST:  10m ISOVUE-370 IOPAMIDOL (ISOVUE-370) INJECTION 76% COMPARISON:  CTA head neck 04/24/2018 FINDINGS: CTA NECK FINDINGS Aortic arch: Atherosclerotic plaque.  Three vessel branching. Right carotid system: Bulky mixed density plaque at the ICA bulb stenosis measuring up to 60%. No ulceration or beading. Left carotid system: Calcified plaque without flow limiting stenosis or ulceration. Vertebral arteries: Proximal subclavian atherosclerotic calcification without stenosis. Both vertebral arteries are smooth and widely patent to the dura. Dominant left vertebral  artery. Skeleton: Remote C1 ring fracture known from 2019 imaging. No fracture displacement. Advanced cervical spine degeneration. Other neck: No significant incidental finding Upper chest: Patchy streaky density in the bilateral apex was also seen previously, compatible with scarring or pneumonitis. Review of the MIP images confirms the above findings CTA HEAD FINDINGS Anterior circulation: Atherosclerotic  calcification of the carotid siphons. No branch occlusion, flow limiting stenosis, beading, or aneurysm. Posterior circulation: Left dominant vertebral artery. The vertebral and basilar arteries are smooth and widely patent. No branch occlusion or flow limiting stenosis. Negative for aneurysm. Venous sinuses: Patent Anatomic variants: None significant Delayed phase: No abnormal intracranial enhancement Review of the MIP images confirms the above findings IMPRESSION: 1. No emergent finding or change from January 2020. 2. Cervical carotid atherosclerosis with up to 60% stenosis at the right ICA bulb. 3. Chronic lung disease. Electronically Signed   By: Monte Fantasia M.D.   On: 05/30/2018 15:32   Vas US Carotid (at [place] Only)  Result Date: 06/01/2018 Carotid Arterial Duplex Study Indications:       CTA of neck shows 60% right ICA stenosis. Comparison Study:  Prior study from 04/24/18 is available for comparison Performing Technologist: Sharion Dove RVS  Examination Guidelines: A complete evaluation includes B-mode imaging, spectral Doppler, color Doppler, and power Doppler as needed of all accessible portions of each vessel. Bilateral testing is considered an integral part of a complete examination. Limited examinations for reoccurring indications may be performed as noted.  Right Carotid Findings: +----------+--------+--------+--------+---------------------+------------------+           PSV cm/sEDV cm/sStenosisDescribe             Comments           +----------+--------+--------+--------+---------------------+------------------+ CCA Prox  80      19                                   intimal thickening +----------+--------+--------+--------+---------------------+------------------+ CCA Distal78      18                                   intimal thickening +----------+--------+--------+--------+---------------------+------------------+ ICA Prox  134     39      40-59%  calcific and          Shadowing                                            irregular                               +----------+--------+--------+--------+---------------------+------------------+ ICA Mid   125     38                                                      +----------+--------+--------+--------+---------------------+------------------+ ICA Distal130     30                                                      +----------+--------+--------+--------+---------------------+------------------+  ECA       163     14                                                      +----------+--------+--------+--------+---------------------+------------------+ +----------+--------+-------+--------+-------------------+           PSV cm/sEDV cmsDescribeArm Pressure (mmHG) +----------+--------+-------+--------+-------------------+ Subclavian102                                        +----------+--------+-------+--------+-------------------+ +---------+--------+--+--------+--+ VertebralPSV cm/s44EDV cm/s12 +---------+--------+--+--------+--+  Left Carotid Findings: +----------+--------+--------+--------+--------+------------------+           PSV cm/sEDV cm/sStenosisDescribeComments           +----------+--------+--------+--------+--------+------------------+ CCA Prox  135     28                      intimal thickening +----------+--------+--------+--------+--------+------------------+ CCA Distal81      28                      intimal thickening +----------+--------+--------+--------+--------+------------------+ ICA Prox  93      33              calcificShadowing          +----------+--------+--------+--------+--------+------------------+ ICA Distal78      25                                         +----------+--------+--------+--------+--------+------------------+ ECA       120     22                                          +----------+--------+--------+--------+--------+------------------+ +----------+--------+--------+--------+-------------------+ SubclavianPSV cm/sEDV cm/sDescribeArm Pressure (mmHG) +----------+--------+--------+--------+-------------------+           160                                         +----------+--------+--------+--------+-------------------+ +---------+--------+--+--------+--+ VertebralPSV cm/s55EDV cm/s14 +---------+--------+--+--------+--+  Summary: Right Carotid: Velocities in the right ICA are consistent with a 40-59%                stenosis. Left Carotid: Velocities in the left ICA are consistent with a 1-39% stenosis. Vertebrals:  Bilateral vertebral arteries demonstrate antegrade flow. Subclavians: Normal flow hemodynamics were seen in bilateral subclavian              arteries. *See table(s) above for measurements and observations.  Electronically signed by Antony Contras MD on 06/01/2018 at 8:53:18 AM.    Final     Assessment and Plan:   1. Pre-op clearance - Able to do house hold chore without any issue. No outside activity. Echo with normal LVEF without WM abnormality.  Given past medical history and based on ACC/AHA guidelines, Micha Erck would be at acceptable risk for the planned procedure without further cardiovascular testing.   2. Paroxymal atrial fibrillation - A paced rhythm. High CHADSVASC score. Eliquis on hold (last dose PM of  3/8). On heparin for anticoagulation. Denies bleeding issue. Continue amiodarone.   3. CAD - No angina. Not on ASA due to need of anticoagulation.   4. Chronic diastolic CHF - Euvolemic. On low dose lasix.    [facility] will sign off.   Medication Recommendations:  Continue current therapy. Resume home Eliquis when stable Other recommendations (labs, testing, etc): None Follow up as an outpatient:  Will schedule with Dr. Harrington Challenger  For questions or updates, please contact [place] Please consult [email or web address] for  contact info under     Jarrett Soho, PA  06/02/2018 10:48 AM

## 2018-06-02 NOTE — Progress Notes (Signed)
Physical Therapy Treatment Patient Details Name: Meredith Gonzalez MRN: [id number] DOB: 07-May-1935 Today's Date: 06/02/2018    History of Present Illness Pt admitted 05/30/2018 for suspected stroke but has not had MRI yet to confirm. PMH significant for recent stroke, DM, HTN, pacemaker, CHF, cervical fracture, femur fracture, recurrent falls.     PT Comments    Patient seen for continued mobility progression. Making good progress towards goals. Does still require some assist for transfers and mobility with RW for safety and stability. Mild unsteadiness with gait with verbal cueing for obstacle navigation and posturing. Possible right carotid endarterectomy for tomorrow. Will continue to follow   Follow Up Recommendations  CIR;Supervision for mobility/OOB     Equipment Recommendations  None recommended by PT    Recommendations for Other Services       Precautions / Restrictions Precautions Precautions: Fall Precaution Comments: has fallen a lot with injuries over past 3 years recent in Sept broke her neck Restrictions Weight Bearing Restrictions: No    Mobility  Bed Mobility Overal bed mobility: Needs Assistance Bed Mobility: Supine to Sit     Supine to sit: Min guard     General bed mobility comments: increased time and effort  Transfers Overall transfer level: Needs assistance Equipment used: Rolling walker (2 wheeled) Transfers: Sit to/from Stand Sit to Stand: Min guard         General transfer comment: Min guard from bedside and toilet; cueing for hand placement and sequencing for safety and efficiency  Ambulation/Gait Ambulation/Gait assistance: Min assist Gait Distance (Feet): 100 Feet Assistive device: Rolling walker (2 wheeled) Gait Pattern/deviations: Step-through pattern;Decreased stride length;Trunk flexed Gait velocity: decreased   General Gait Details: sloe pace of gait; multiple standing rest breaks. Patient requiring cueing for gait pattern and  obstacle navigation in hallway   Stairs             Wheelchair Mobility    Modified Rankin (Stroke Patients Only) Modified Rankin (Stroke Patients Only) Pre-Morbid Rankin Score: Slight disability Modified Rankin: Moderately severe disability     Balance Overall balance assessment: Needs assistance;History of Falls Sitting-balance support: Feet supported Sitting balance-Leahy Scale: Fair     Standing balance support: Bilateral upper extremity supported;During functional activity Standing balance-Leahy Scale: Poor Standing balance comment: UE support                            Cognition Arousal/Alertness: Awake/alert Behavior During Therapy: WFL for tasks assessed/performed Overall Cognitive Status: Within Functional Limits for tasks assessed                                        Exercises      General Comments        Pertinent Vitals/Pain Pain Assessment: Faces Faces Pain Scale: Hurts a little bit Pain Location: neck Pain Descriptors / Indicators: Aching Pain Intervention(s): Limited activity within patient's tolerance;Monitored during session;Repositioned;Heat applied    Home Living                      Prior Function            PT Goals (current goals can now be found in the care plan section) Acute Rehab PT Goals Patient Stated Goal: "go back home" PT Goal Formulation: With patient Time For Goal Achievement: 06/07/18 Potential to Achieve Goals: Good Progress towards  PT goals: Progressing toward goals    Frequency    Min 4X/week      PT Plan Current plan remains appropriate    Co-evaluation              AM-PAC PT "6 Clicks" Mobility   Outcome Measure  Help needed turning from your back to your side while in a flat bed without using bedrails?: A Little Help needed moving from lying on your back to sitting on the side of a flat bed without using bedrails?: A Little Help needed moving to and  from a bed to a chair (including a wheelchair)?: A Little Help needed standing up from a chair using your arms (e.g., wheelchair or bedside chair)?: A Little Help needed to walk in hospital room?: A Little Help needed climbing 3-5 steps with a railing? : A Lot 6 Click Score: 17    End of Session Equipment Utilized During Treatment: Gait belt Activity Tolerance: Patient tolerated treatment well Patient left: in chair;with call bell/phone within reach;with chair alarm set Nurse Communication: Mobility status PT Visit Diagnosis: Hemiplegia and hemiparesis Hemiplegia - Right/Left: Left Hemiplegia - dominant/non-dominant: Non-dominant Hemiplegia - caused by: Cerebral infarction     Time: 0920-0949 PT Time Calculation (min) (ACUTE ONLY): 29 min  Charges:  $Gait Training: 8-22 mins $Therapeutic Activity: 8-22 mins                      Lanney Gins, PT, DPT Supplemental Physical Therapist 06/02/18 10:19 AM Pager: [phone] Office: [phone]

## 2018-06-03 ENCOUNTER — Inpatient Hospital Stay (HOSPITAL_COMMUNITY): Payer: Medicare Other | Admitting: Anesthesiology

## 2018-06-03 ENCOUNTER — Ambulatory Visit: Payer: Medicare Other | Admitting: Internal Medicine

## 2018-06-03 ENCOUNTER — Encounter (HOSPITAL_COMMUNITY)
Admission: EM | Disposition: A | Discharge status: Rehabilitation Facility | Payer: Self-pay | Source: Home / Self Care | Attending: Internal Medicine

## 2018-06-03 ENCOUNTER — Encounter (HOSPITAL_COMMUNITY): Payer: Self-pay | Admitting: Orthopedic Surgery

## 2018-06-03 DIAGNOSIS — I6521 Occlusion and stenosis of right carotid artery: Secondary | ICD-10-CM

## 2018-06-03 HISTORY — PX: PATCH ANGIOPLASTY: SHX6230

## 2018-06-03 HISTORY — PX: ENDARTERECTOMY: SHX5162

## 2018-06-03 LAB — CBC
HCT: 32.2 % — ABNORMAL LOW (ref 36.0–46.0)
Hemoglobin: 9.5 g/dL — ABNORMAL LOW (ref 12.0–15.0)
MCH: 29.1 pg (ref 26.0–34.0)
MCHC: 29.5 g/dL — ABNORMAL LOW (ref 30.0–36.0)
MCV: 98.5 fL (ref 80.0–100.0)
Platelets: 214 10*3/uL (ref 150–400)
RBC: 3.27 MIL/uL — ABNORMAL LOW (ref 3.87–5.11)
RDW: 19.9 % — ABNORMAL HIGH (ref 11.5–15.5)
WBC: 5.2 10*3/uL (ref 4.0–10.5)
nRBC: 0 % (ref 0.0–0.2)

## 2018-06-03 LAB — APTT: APTT: 129 s — AB (ref 24–36)

## 2018-06-03 LAB — BASIC METABOLIC PANEL
Anion gap: 7 (ref 5–15)
BUN: 12 mg/dL (ref 8–23)
CO2: 27 mmol/L (ref 22–32)
Calcium: 8.3 mg/dL — ABNORMAL LOW (ref 8.9–10.3)
Chloride: 107 mmol/L (ref 98–111)
Creatinine, Ser: 0.68 mg/dL (ref 0.44–1.00)
GFR calc Af Amer: >60 mL/min (ref >60)
GFR calc non Af Amer: >60 mL/min (ref >60)
Glucose, Bld: 101 mg/dL — ABNORMAL HIGH (ref 70–99)
Potassium: 3.5 mmol/L (ref 3.5–5.1)
Sodium: 141 mmol/L (ref 135–145)

## 2018-06-03 LAB — PROTIME-INR
INR: 1 (ref 0.8–1.2)
Prothrombin Time: 12.8 seconds (ref 11.4–15.2)

## 2018-06-03 LAB — HEPARIN LEVEL (UNFRACTIONATED)
HEPARIN UNFRACTIONATED: 0.74 [IU]/mL — AB (ref 0.30–0.70)
Heparin Unfractionated: <0.1 IU/mL — ABNORMAL LOW (ref 0.30–0.70)

## 2018-06-03 LAB — SURGICAL PCR SCREEN
MRSA, PCR: POSITIVE — AB
Staphylococcus aureus: POSITIVE — AB

## 2018-06-03 LAB — GLUCOSE, CAPILLARY
Glucose-Capillary: 100 mg/dL — ABNORMAL HIGH (ref 70–99)
Glucose-Capillary: 118 mg/dL — ABNORMAL HIGH (ref 70–99)

## 2018-06-03 SURGERY — ENDARTERECTOMY, CAROTID
Anesthesia: General | Site: Neck | Laterality: Right

## 2018-06-03 MED ORDER — HEPARIN SODIUM (PORCINE) 1000 UNIT/ML IJ SOLN
INTRAMUSCULAR | Status: DC | PRN
  Administered 2018-06-03: 6000 [IU] via INTRAVENOUS

## 2018-06-03 MED ORDER — SODIUM CHLORIDE 0.9 % IV SOLN
500.0000 mL | Freq: Once | INTRAVENOUS | Status: AC | PRN
  Administered 2018-06-03: 500 mL via INTRAVENOUS

## 2018-06-03 MED ORDER — HEPARIN SODIUM (PORCINE) 1000 UNIT/ML IJ SOLN
INTRAMUSCULAR | Status: AC
  Filled 2018-06-03: qty 1

## 2018-06-03 MED ORDER — PROTAMINE SULFATE 10 MG/ML IV SOLN
INTRAVENOUS | Status: AC
  Filled 2018-06-03: qty 5

## 2018-06-03 MED ORDER — CHLORHEXIDINE GLUCONATE CLOTH 2 % EX PADS: 6.0000 | MEDICATED_PAD | Freq: Every day | CUTANEOUS | Status: DC

## 2018-06-03 MED ORDER — DOCUSATE SODIUM 100 MG PO CAPS
100.0000 mg | ORAL_CAPSULE | Freq: Every day | ORAL | Status: DC
  Administered 2018-06-04 – 2018-06-05 (×2): 100 mg via ORAL
  Filled 2018-06-03 (×2): qty 1

## 2018-06-03 MED ORDER — LIDOCAINE HCL (PF) 1 % IJ SOLN
INTRAMUSCULAR | Status: AC
  Filled 2018-06-03: qty 30

## 2018-06-03 MED ORDER — CHLORHEXIDINE GLUCONATE CLOTH 2 % EX PADS
6.0000 | MEDICATED_PAD | Freq: Every day | CUTANEOUS | Status: DC
  Administered 2018-06-03 – 2018-06-05 (×2): 6 via TOPICAL

## 2018-06-03 MED ORDER — SUGAMMADEX SODIUM 200 MG/2ML IV SOLN
INTRAVENOUS | Status: DC | PRN
  Administered 2018-06-03: 121 mg via INTRAVENOUS

## 2018-06-03 MED ORDER — FENTANYL CITRATE (PF) 100 MCG/2ML IJ SOLN
INTRAMUSCULAR | Status: DC | PRN
  Administered 2018-06-03: 50 ug via INTRAVENOUS

## 2018-06-03 MED ORDER — SODIUM CHLORIDE 0.9 % IV SOLN
0.0125 ug/kg/min | INTRAVENOUS | Status: AC
  Administered 2018-06-03: .05 ug/kg/min via INTRAVENOUS
  Filled 2018-06-03: qty 2000

## 2018-06-03 MED ORDER — DEXAMETHASONE SODIUM PHOSPHATE 10 MG/ML IJ SOLN
INTRAMUSCULAR | Status: AC
  Filled 2018-06-03: qty 1

## 2018-06-03 MED ORDER — LIDOCAINE HCL (PF) 0.5 % IJ SOLN
INTRAMUSCULAR | Status: AC
  Filled 2018-06-03: qty 50

## 2018-06-03 MED ORDER — HYDRALAZINE HCL 20 MG/ML IJ SOLN: 5.0000 mg | INTRAMUSCULAR | Status: DC | PRN

## 2018-06-03 MED ORDER — CEFAZOLIN SODIUM-DEXTROSE 2-4 GM/100ML-% IV SOLN
2.0000 g | Freq: Three times a day (TID) | INTRAVENOUS | Status: AC
  Administered 2018-06-03 – 2018-06-04 (×2): 2 g via INTRAVENOUS
  Filled 2018-06-03 (×2): qty 100

## 2018-06-03 MED ORDER — ONDANSETRON HCL 4 MG/2ML IJ SOLN: 4.0000 mg | Freq: Once | INTRAMUSCULAR | Status: DC | PRN

## 2018-06-03 MED ORDER — 0.9 % SODIUM CHLORIDE (POUR BTL) OPTIME
TOPICAL | Status: DC | PRN
  Administered 2018-06-03: 1000 mL

## 2018-06-03 MED ORDER — ONDANSETRON HCL 4 MG/2ML IJ SOLN: 4.0000 mg | Freq: Four times a day (QID) | INTRAMUSCULAR | Status: DC | PRN

## 2018-06-03 MED ORDER — ROCURONIUM BROMIDE 50 MG/5ML IV SOSY
PREFILLED_SYRINGE | INTRAVENOUS | Status: AC
  Filled 2018-06-03: qty 5

## 2018-06-03 MED ORDER — MUPIROCIN 2 % EX OINT
1.0000 "application " | TOPICAL_OINTMENT | Freq: Two times a day (BID) | CUTANEOUS | Status: DC
  Administered 2018-06-03 – 2018-06-05 (×5): 1 via NASAL
  Filled 2018-06-03 (×4): qty 22

## 2018-06-03 MED ORDER — MEPERIDINE HCL 50 MG/ML IJ SOLN: 6.2500 mg | INTRAMUSCULAR | Status: DC | PRN

## 2018-06-03 MED ORDER — LIDOCAINE 2% (20 MG/ML) 5 ML SYRINGE
INTRAMUSCULAR | Status: AC
  Filled 2018-06-03: qty 5

## 2018-06-03 MED ORDER — PROTAMINE SULFATE 10 MG/ML IV SOLN
INTRAVENOUS | Status: DC | PRN
  Administered 2018-06-03 (×5): 10 mg via INTRAVENOUS

## 2018-06-03 MED ORDER — FENTANYL CITRATE (PF) 100 MCG/2ML IJ SOLN: 25.0000 ug | INTRAMUSCULAR | Status: DC | PRN

## 2018-06-03 MED ORDER — REMIFENTANIL HCL 1 MG IV SOLR
INTRAVENOUS | Status: DC | PRN
  Administered 2018-06-03: 20 ug via INTRAVENOUS

## 2018-06-03 MED ORDER — LABETALOL HCL 5 MG/ML IV SOLN: 10.0000 mg | INTRAVENOUS | Status: DC | PRN

## 2018-06-03 MED ORDER — SODIUM CHLORIDE 0.9 % IV SOLN
INTRAVENOUS | Status: AC
  Filled 2018-06-03: qty 1.2

## 2018-06-03 MED ORDER — METOPROLOL TARTRATE 5 MG/5ML IV SOLN: 2.0000 mg | INTRAVENOUS | Status: DC | PRN

## 2018-06-03 MED ORDER — GUAIFENESIN-DM 100-10 MG/5ML PO SYRP: 15.0000 mL | ORAL_SOLUTION | ORAL | Status: DC | PRN

## 2018-06-03 MED ORDER — PANTOPRAZOLE SODIUM 40 MG PO TBEC
40.0000 mg | DELAYED_RELEASE_TABLET | Freq: Every day | ORAL | Status: DC
  Administered 2018-06-03 – 2018-06-05 (×3): 40 mg via ORAL
  Filled 2018-06-03 (×3): qty 1

## 2018-06-03 MED ORDER — LACTATED RINGERS IV SOLN: INTRAVENOUS | Status: DC

## 2018-06-03 MED ORDER — TRAMADOL HCL 50 MG PO TABS
50.0000 mg | ORAL_TABLET | Freq: Four times a day (QID) | ORAL | Status: DC | PRN
  Administered 2018-06-03 – 2018-06-04 (×2): 50 mg via ORAL
  Filled 2018-06-03 (×2): qty 1

## 2018-06-03 MED ORDER — ONDANSETRON HCL 4 MG/2ML IJ SOLN
INTRAMUSCULAR | Status: DC | PRN
  Administered 2018-06-03: 4 mg via INTRAVENOUS

## 2018-06-03 MED ORDER — ONDANSETRON HCL 4 MG/2ML IJ SOLN
INTRAMUSCULAR | Status: AC
  Filled 2018-06-03: qty 2

## 2018-06-03 MED ORDER — PHENOL 1.4 % MT LIQD
1.0000 | OROMUCOSAL | Status: DC | PRN
  Filled 2018-06-03: qty 177

## 2018-06-03 MED ORDER — MUPIROCIN 2 % EX OINT: 1.0000 "application " | TOPICAL_OINTMENT | Freq: Two times a day (BID) | CUTANEOUS | Status: DC

## 2018-06-03 MED ORDER — LABETALOL HCL 5 MG/ML IV SOLN
INTRAVENOUS | Status: DC | PRN
  Administered 2018-06-03 (×2): 10 mg via INTRAVENOUS

## 2018-06-03 MED ORDER — CEFAZOLIN SODIUM-DEXTROSE 2-4 GM/100ML-% IV SOLN
2.0000 g | INTRAVENOUS | Status: DC
  Filled 2018-06-03: qty 100

## 2018-06-03 MED ORDER — POTASSIUM CHLORIDE CRYS ER 20 MEQ PO TBCR: 20.0000 meq | EXTENDED_RELEASE_TABLET | Freq: Once | ORAL | Status: DC | PRN

## 2018-06-03 MED ORDER — SODIUM CHLORIDE 0.9 % IV SOLN
INTRAVENOUS | Status: DC | PRN
  Administered 2018-06-03: 14:00:00

## 2018-06-03 MED ORDER — ALUM & MAG HYDROXIDE-SIMETH 200-200-20 MG/5ML PO SUSP: 15.0000 mL | ORAL | Status: DC | PRN

## 2018-06-03 MED ORDER — ROCURONIUM BROMIDE 50 MG/5ML IV SOSY
PREFILLED_SYRINGE | INTRAVENOUS | Status: DC | PRN
  Administered 2018-06-03: 50 mg via INTRAVENOUS

## 2018-06-03 MED ORDER — FENTANYL CITRATE (PF) 250 MCG/5ML IJ SOLN
INTRAMUSCULAR | Status: AC
  Filled 2018-06-03: qty 5

## 2018-06-03 MED ORDER — ASPIRIN EC 325 MG PO TBEC
325.0000 mg | DELAYED_RELEASE_TABLET | Freq: Every day | ORAL | Status: DC
  Administered 2018-06-03 – 2018-06-04 (×2): 325 mg via ORAL
  Filled 2018-06-03 (×2): qty 1

## 2018-06-03 MED ORDER — PROPOFOL 10 MG/ML IV BOLUS
INTRAVENOUS | Status: DC | PRN
  Administered 2018-06-03: 130 mg via INTRAVENOUS

## 2018-06-03 MED ORDER — LACTATED RINGERS IV SOLN
INTRAVENOUS | Status: DC
  Administered 2018-06-03 (×3): via INTRAVENOUS

## 2018-06-03 MED ORDER — SODIUM CHLORIDE 0.9 % IV SOLN
INTRAVENOUS | Status: DC | PRN
  Administered 2018-06-03: 50 ug/min via INTRAVENOUS

## 2018-06-03 SURGICAL SUPPLY — 44 items
CANISTER SUCT 3000ML PPV (MISCELLANEOUS) ×3 IMPLANT
CANNULA VESSEL 3MM 2 BLNT TIP (CANNULA) ×6 IMPLANT
CATH ROBINSON RED A/P 18FR (CATHETERS) ×3 IMPLANT
CLIP LIGATING EXTRA MED SLVR (CLIP) ×5 IMPLANT
CLIP LIGATING EXTRA SM BLUE (MISCELLANEOUS) ×3 IMPLANT
COVER WAND RF STERILE (DRAPES) ×3 IMPLANT
CRADLE DONUT ADULT HEAD (MISCELLANEOUS) ×3 IMPLANT
DECANTER SPIKE VIAL GLASS SM (MISCELLANEOUS) IMPLANT
DERMABOND ADVANCED (GAUZE/BANDAGES/DRESSINGS) ×2
DERMABOND ADVANCED .7 DNX12 (GAUZE/BANDAGES/DRESSINGS) ×1 IMPLANT
DRAIN HEMOVAC 1/8 X 5 (WOUND CARE) IMPLANT
ELECT REM PT RETURN 9FT ADLT (ELECTROSURGICAL) ×3
ELECTRODE REM PT RTRN 9FT ADLT (ELECTROSURGICAL) ×1 IMPLANT
EVACUATOR SILICONE 100CC (DRAIN) IMPLANT
GLOVE BIO SURGEON STRL SZ 6.5 (GLOVE) ×3 IMPLANT
GLOVE BIO SURGEONS STRL SZ 6.5 (GLOVE) ×3
GLOVE BIOGEL PI IND STRL 6.5 (GLOVE) IMPLANT
GLOVE BIOGEL PI INDICATOR 6.5 (GLOVE) ×8
GLOVE SS BIOGEL STRL SZ 7.5 (GLOVE) ×1 IMPLANT
GLOVE SUPERSENSE BIOGEL SZ 7.5 (GLOVE) ×2
GLOVE SURG SS PI 6.5 STRL IVOR (GLOVE) ×2 IMPLANT
GOWN STRL NON-REIN LRG LVL3 (GOWN DISPOSABLE) ×2 IMPLANT
GOWN STRL REUS W/ TWL LRG LVL3 (GOWN DISPOSABLE) ×3 IMPLANT
GOWN STRL REUS W/TWL LRG LVL3 (GOWN DISPOSABLE) ×6
KIT BASIN OR (CUSTOM PROCEDURE TRAY) ×3 IMPLANT
KIT SHUNT ARGYLE CAROTID ART 6 (VASCULAR PRODUCTS) IMPLANT
KIT TURNOVER KIT B (KITS) ×3 IMPLANT
NEEDLE 22X1 1/2 (OR ONLY) (NEEDLE) IMPLANT
NS IRRIG 1000ML POUR BTL (IV SOLUTION) ×8 IMPLANT
PACK CAROTID (CUSTOM PROCEDURE TRAY) ×3 IMPLANT
PAD ARMBOARD 7.5X6 YLW CONV (MISCELLANEOUS) ×6 IMPLANT
PATCH HEMASHIELD 8X75 (Vascular Products) ×2 IMPLANT
SHUNT CAROTID BYPASS 10 (VASCULAR PRODUCTS) ×2 IMPLANT
SHUNT CAROTID BYPASS 12FRX15.5 (VASCULAR PRODUCTS) IMPLANT
SUT ETHILON 3 0 PS 1 (SUTURE) IMPLANT
SUT PROLENE 6 0 CC (SUTURE) ×3 IMPLANT
SUT SILK 3 0 (SUTURE)
SUT SILK 3-0 18XBRD TIE 12 (SUTURE) IMPLANT
SUT VIC AB 3-0 SH 27 (SUTURE) ×4
SUT VIC AB 3-0 SH 27X BRD (SUTURE) ×2 IMPLANT
SUT VICRYL 4-0 PS2 18IN ABS (SUTURE) ×3 IMPLANT
SYR CONTROL 10ML LL (SYRINGE) IMPLANT
TOWEL GREEN STERILE (TOWEL DISPOSABLE) ×3 IMPLANT
WATER STERILE IRR 1000ML POUR (IV SOLUTION) ×3 IMPLANT

## 2018-06-03 NOTE — Progress Notes (Signed)
OT Cancellation Note    06/03/18 1200  OT Visit Information  Last OT Received On 06/03/18  Reason Eval/Treat Not Completed Patient at procedure or test/ unavailable  Maurie Boettcher, OT/L   Acute OT Clinical Specialist Acute Rehabilitation Services Pager [phone] Office [phone]

## 2018-06-03 NOTE — Progress Notes (Addendum)
PCR swab positive MRSA/Staph Aureus per Abby, Lab.  Contact precautions  and MRSA protocol initiated.

## 2018-06-03 NOTE — Anesthesia Procedure Notes (Signed)
Arterial Line Insertion Start/End3/01/2019 1:15 PM, 06/03/2018 1:20 PM Performed by: Wilburn Cornelia, CRNA, CRNA  Patient location: Pre-op. Preanesthetic checklist: patient identified, IV checked, site marked, risks and benefits discussed, surgical consent, monitors and equipment checked, pre-op evaluation, timeout performed and anesthesia consent Lidocaine 1% used for infiltration Left, radial was placed Catheter size: 20 G Hand hygiene performed  and maximum sterile barriers used  Allen's test indicative of satisfactory collateral circulation Attempts: 2 Procedure performed without using ultrasound guided technique. Following insertion, Biopatch and dressing applied. Post procedure assessment: normal  Patient tolerated the procedure well with no immediate complications.

## 2018-06-03 NOTE — Progress Notes (Signed)
PROGRESS NOTE    Meredith Gonzalez  TKP:[id number] DOB: June 13, 1935 DOA: 05/30/2018 PCP: Sinclair Ship, MD     Brief Narrative:  Meredith Gonzalez is a 83 year old female, lives alone, ambulates with the help of a Rollator or walker, PMH of DM 2, HTN, hypothyroid, A. fib on Eliquis, CAD/MI, chronic diastolic CHF, PPM, CVA, anxiety and depression, presented to [facility] due to slurred speech, double vision, left-sided weakness and a fall that started on 05/28/2018.  She was subsequently admitted for possible small right brain stroke due to symptomatic right ICA stenosis.  Neurology and vascular surgery consulted.   New events last 24 hours / Subjective: Complains of a headache and anxiety due to pending CEA procedure this afternoon. Denies chest pain, abdominal pain, nausea, vomiting.   Assessment & Plan:   Principal Problem:   Stroke [facility]) Active Problems:   Anemia   Persistent atrial fibrillation   Hypertension   Hypothyroidism   Diabetes ([place])   Pacemaker   CAD (coronary artery disease)   Chronic diastolic congestive heart failure (HCC)   Acute ischemic stroke (HCC)   Stroke-like symptom   Dyspnea   Acute blood loss anemia   Possible small right brain stroke -CT head: No acute intracranial pathology.  Small vessel white matter disease.  Redemonstrated fractures of the dens and C1 vertebral body -CTA of the head and neck: No emergent finding or change from January 2020.  Cervical carotid atherosclerosis with up to 60% stenosis at the right ICA bulb. -Unable to do MRI due to PPM.  Thereby repeated CT head 3/8: No appreciable infarct or intracranial hemorrhage -TTE: LVEF 60-65% -Carotid Doppler: Right 40-59% ICA stenosis.  Left 1-39% ICA stenosis.   -LDL 28.  A1c 5.7 -Neurology consulted. Patient presentation concerning for recurrent stroke due to symptomatic right ICA stenosis versus A. fib on lower dose of Eliquis with which she is compliant.  She has had 2 admissions with  similar presentations of left-sided weakness.  -Vascular surgery consulted, right CEA planned today   Right carotid stenosis -Suspect symptomatic right ICA stenosis. Right CEA planned today  Paroxysmal A. Fib -Patient started Eliquis after last stroke in January 2020.  She was on 2.5 mg twice daily.  Given recurrent stroke and room for increase, Eliquis dose increased to 5 mg twice daily. On IV heparin pending procedure  Type II DM, well controlled  -A1c 5.7  Hyperlipidemia -LDL 28, goal <70.  Atorvastatin reduced from 40 mg daily to 20 mg daily.  Essential hypertension -Allow permissive hypertension.  Currently controlled off of meds.  CAD -No anginal symptoms  Chronic diastolic CHF -Continue home dose of furosemide 10 mg daily.  Compensated.  Fracture of dens and C1 vertebral body -Noted on CT head, stable from CT spine 12/2017.  Outpatient follow-up with Dr. Vertell Limber, neurosurgery.    DVT prophylaxis: IV heparin (on eliquis at home) Code Status: DNR Family Communication: No family at bedside Disposition Plan: CEA today. CIR recommended   Consultants:   Neurology  Vascular surgery  Cardiology  Antimicrobials:  Anti-infectives (From admission, onward)   Start     Dose/Rate Route Frequency Ordered Stop   06/03/18 0600  ceFAZolin (ANCEF) IVPB 2g/100 mL premix    Note to Pharmacy:  Send with pt to OR   2 g 200 mL/hr over 30 Minutes Intravenous On call 06/02/18 0748 06/04/18 0600       Objective: Vitals:   06/02/18 2343 06/03/18 0400 06/03/18 0829 06/03/18 0858  BP: Marland Kitchen)  135/58 130/66 (!) 147/62   Pulse: 66 66 65 65  Resp: 17 17 18 18   Temp: (!) 97.5 F (36.4 C) 98 F (36.7 C) 98.1 F (36.7 C)   TempSrc: Oral Oral Oral   SpO2: 96% 97% 98% 98%  Weight:      Height:       No intake or output data in the 24 hours ending 06/03/18 1103 Filed Weights   05/30/18 1750  Weight: 60.5 kg    Examination:  General exam: Appears calm and comfortable   Respiratory system: Clear to auscultation. Respiratory effort normal. Cardiovascular system: S1 & S2 heard, RRR. No JVD, murmurs, rubs, gallops or clicks. No pedal edema. Gastrointestinal system: Abdomen is nondistended, soft and nontender. No organomegaly or masses felt. Normal bowel sounds heard. Central nervous system: Alert and oriented, speech clear  Extremities: Symmetric  Skin: No rashes, lesions or ulcers Psychiatry: Judgement and insight appear normal. Mood & affect appropriate.   Data Reviewed: I have personally reviewed following labs and imaging studies  CBC: Recent Labs  Lab 05/30/18 1413 05/31/18 1148 06/01/18 0458 06/02/18 0258 06/03/18 0412  WBC 10.7* 6.5 6.3 6.8 5.2  NEUTROABS 8.8*  --   --   --   --   HGB 10.2* 9.1* 8.7* 9.4* 9.5*  HCT 35.1* 31.1* 29.9* 31.7* 32.2*  MCV 100.6* 99.7 100.0 98.8 98.5  PLT 234 207 200 210 540   Basic Metabolic Panel: Recent Labs  Lab 05/30/18 1413 05/31/18 1148 06/01/18 0458 06/03/18 0412  NA 137 141 140 141  K 3.6 3.4* 3.8 3.5  CL 108 113* 112* 107  CO2 22 23 24 27   GLUCOSE 104* 100* 122* 101*  BUN 29* 18 21 12   CREATININE 1.19* 0.80 0.64 0.68  CALCIUM 8.2* 8.1* 7.9* 8.3*   GFR: Estimated Creatinine Clearance: 50.5 mL/min (by C-G formula based on SCr of 0.68 mg/dL). Liver Function Tests: Recent Labs  Lab 05/30/18 1413 05/31/18 1148  AST 19 17  ALT 18 16  ALKPHOS 102 90  BILITOT 0.7 0.7  PROT 7.3 5.8*  ALBUMIN 3.4* 2.6*   No results for input(s): LIPASE, AMYLASE in the last 168 hours. No results for input(s): AMMONIA in the last 168 hours. Coagulation Profile: Recent Labs  Lab 05/30/18 1413 06/03/18 0412  INR 1.1 1.0   Cardiac Enzymes: No results for input(s): CKTOTAL, CKMB, CKMBINDEX, TROPONINI in the last 168 hours. BNP (last 3 results) No results for input(s): PROBNP in the last 8760 hours. HbA1C: Recent Labs    05/31/18 1148  HGBA1C 5.7*   CBG: Recent Labs  Lab 05/30/18 1410  GLUCAP 87    Lipid Profile: Recent Labs    05/31/18 1148  CHOL 82  HDL 50  LDLCALC 28  TRIG 22  CHOLHDL 1.6   Thyroid Function Tests: No results for input(s): TSH, T4TOTAL, FREET4, T3FREE, THYROIDAB in the last 72 hours. Anemia Panel: No results for input(s): VITAMINB12, FOLATE, FERRITIN, TIBC, IRON, RETICCTPCT in the last 72 hours. Sepsis Labs: No results for input(s): PROCALCITON, LATICACIDVEN in the last 168 hours.  Recent Results (from the past 240 hour(s))  Surgical pcr screen     Status: Abnormal   Collection Time: 06/03/18  5:09 AM  Result Value Ref Range Status   MRSA, PCR POSITIVE (A) NEGATIVE Final    Comment: RESULT CALLED TO, READ BACK BY AND VERIFIED WITH: T.ADKERSON RN AT 0867 06/03/2018 BY A.DAVIS    Staphylococcus aureus POSITIVE (A) NEGATIVE Final    Comment: (  NOTE) The Xpert SA Assay (FDA approved for NASAL specimens in patients 36 years of age and older), is one component of a comprehensive surveillance program. It is not intended to diagnose infection nor to guide or monitor treatment. Performed at [facility], [place] [address], [place], [place] [address]        Radiology Studies: No results found.    Scheduled Meds: . amiodarone  200 mg Oral [id number]  . atorvastatin  20 mg Oral Daily  . calcium-vitamin D  1 tablet Oral BID  . Chlorhexidine Gluconate Cloth  6 each Topical Q0600  . ferrous sulfate  325 mg Oral BID WC  . furosemide  10 mg Oral Daily  . gabapentin  300 mg Oral q morning - 10a  . gabapentin  600 mg Oral QPM  . levothyroxine  50 mcg Oral Q0600  . mometasone-formoterol  2 puff Inhalation BID  . multivitamin with minerals  1 tablet Oral Daily  . mupirocin ointment  1 application Nasal BID  . traZODone  25 mg Oral QHS  . vitamin B-12  1,000 mcg Oral Daily   Continuous Infusions: .  ceFAZolin (ANCEF) IV    . heparin 1,100 Units/hr (06/03/18 4103)     LOS: 3 days    Time spent: 40 minutes   Dessa Phi, DO [facility]  [email or web address] 06/03/2018, 11:03 AM

## 2018-06-03 NOTE — Op Note (Signed)
° °  OPERATIVE REPORT  DATE OF SURGERY: 06/03/2018  PATIENT: Meredith Gonzalez, 83 y.o. female MRN: [id number]  DOB: [birth year]  PRE-OPERATIVE DIAGNOSIS: Right Carotid Stenosis, Symptomatic  POST-OPERATIVE DIAGNOSIS:  Same  PROCEDURE:  Right Carotid Endarterectomy with Dacron Patch Angioplasty  SURGEON:  Curt Jews, M.D.  PHYSICIAN ASSISTANT: Nurse  ANESTHESIA:   general  EBL: Less than 200 ml  Total I/O In: 314.2 [I.V.:314.2] Out: -   BLOOD ADMINISTERED: none  DRAINS: none   SPECIMEN: none  COUNTS CORRECT:  YES  PLAN OF CARE: Admit to inpatient   PATIENT DISPOSITION:  PACU - hemodynamically stable and neurologically intact.  PROCEDURE DETAILS: The patient was taken to the operating room placed in supine position.  General anesthesia was administered.  The neck was prepped and draped in the usual sterile fashion.  An incision was made anterior to the sternocleidomastoid and carried down through the platysma with electrocautery.  The sternocleidomastoid was reflected posteriorly and the carotid sheath was opened.  The facial vein was ligated with 2-0 silk ties and divided.  The common carotid artery was encircled with an umbilical tape and Rummel tourniquet.  The vagus nerve was identified and preserved.  Dissection was continued onto the carotid bifurcation.  The superior thyroid artery was encircled with a 2-0 silk Potts tie.  The external carotid was encircled with a blue vessel loop and the internal carotid was encircled with an umbilical tape and Rummel tourniquet.  The hypoglossal nerve was identified and preserved.  The patient was given systemic heparin and after adequate circulation time, the internal, external and common carotid arteries were occluded with vascular clamps.  The common carotid artery was opened with an 11 blade and extended  longitudinally with Potts scissors.  A 10 shunt was passed up the internal carotid and allowed to backbleed.  It was then passed down the  common carotid where it was secured with Rummel tourniquet.  The endarterectomy was begun on the common carotid artery and the plaque was divided proximally with Potts scissors.  The endarterectomy was continued onto the bifurcation.  The external carotid was endarterectomized with an eversion technique and the internal carotid was endarterectomized in an open fashion.  Remaining atheromatous debris was removed from the endarterectomy plane.  A Finesse Hemashield Dacron patch was brought onto the field and was sewn as a patch angioplasty with a running 6-0 Prolene suture.  Prior to completion of the closure the shunt was removed and the usual flushing maneuvers were undertaken.  The anastomosis was completed and flow was restored first to the external and then the internal carotid artery.  Excellent flow characteristics were noted with hand-held Doppler in the internal and external carotid arteries.  The patient was given 50 mg of protamine to reverse the heparin.  The wounds were irrigated with saline.  Hemostasis was obtained with electrocautery.  The wounds were closed with 3-0 Vicryl to reapproximate the sternocleidomastoid over the carotid sheath.  The platysma was lysed with a running 3-0 Vicryl suture.  The skin was closed with a 4-0 subcuticular Vicryl stitch.  Dermabond was applied.  The patient was awakened neurologically intact in the operating room and transferred to the recovery room in stable condition   Curt Jews, M.D. 06/03/2018 3:50 PM

## 2018-06-03 NOTE — Progress Notes (Signed)
ANTICOAGULATION CONSULT NOTE - Follow Up Consult  Pharmacy Consult for heparin Indication: atrial fibrillation and stroke  Labs: Recent Labs    05/31/18 1148 06/01/18 0458  06/02/18 0258 06/02/18 1139 06/02/18 2003 06/03/18 0412  HGB 9.1* 8.7*  --  9.4*  --   --  9.5*  HCT 31.1* 29.9*  --  31.7*  --   --  32.2*  PLT 207 200  --  210  --   --  214  APTT  --   --    < > 44* 47* 57* 129*  LABPROT  --   --   --   --   --   --  12.8  INR  --   --   --   --   --   --  1.0  HEPARINUNFRC  --   --    < > 0.55  --  0.33 0.74*  CREATININE 0.80 0.64  --   --   --   --  0.68   < > = values in this interval not displayed.    Assessment: 83yo female now supratherapeutic on heparin after one level at goal; no gtt issues or signs of bleeding per RN; lab drawn from arm opposite from IV infusion.  Goal of Therapy:  Heparin level 0.3-0.5 units/ml   Plan:  Will decrease heparin gtt by ~2 units/kg/hr to 1100 units/hr and check level in 8 hours.    Wynona Neat, PharmD, BCPS  06/03/2018,6:01 AM

## 2018-06-03 NOTE — Transfer of Care (Signed)
Immediate Anesthesia Transfer of Care Note  Patient: Meredith Gonzalez  Procedure(s) Performed: ENDARTERECTOMY CAROTID (Right Neck) PATCH ANGIOPLASTY USING HEMASHIELD PLATINUM FINESSE (Right Neck)  Patient Location: PACU  Anesthesia Type:General  Level of Consciousness: awake and alert   Airway & Oxygen Therapy: Patient Spontanous Breathing and Patient connected to nasal cannula oxygen  Post-op Assessment: Report given to RN and Post -op Vital signs reviewed and stable  Post vital signs: Reviewed and stable  Last Vitals:  Vitals Value Taken Time  BP 123/58 06/03/2018  3:53 PM  Temp    Pulse 77 06/03/2018  3:58 PM  Resp 16 06/03/2018  3:58 PM  SpO2 96 % 06/03/2018  3:58 PM  Vitals shown include unvalidated device data.  Last Pain:  Vitals:   06/03/18 1145  TempSrc: Oral  PainSc:       Patients Stated Pain Goal: 2 (59/93/57 0177)  Complications: No apparent anesthesia complications

## 2018-06-03 NOTE — Progress Notes (Signed)
Pt off unit to OR for procedure; report called off to RN in short stay. Pt heparin drip stopped prior to transporting off unit after clarification with Dr. Donnetta Hutching in the OR. Pt refused to have her dentures, hearing aids or wedding ring removed. She stated her ring can be tapped and MD told her, she can keep her dentures and hearing aids in until she speaks with anesthesiologist. Pt transported off unit via bed with son at side and pt belongings. Delia Heady RN

## 2018-06-03 NOTE — Anesthesia Preprocedure Evaluation (Addendum)
Anesthesia Evaluation  Patient identified by MRN, date of birth, ID band Patient awake    Reviewed: Allergy & Precautions, NPO status , Patient's Chart, lab work & pertinent test results  Airway Mallampati: I  TM Distance: >3 FB Neck ROM: Full    Dental  (+) Upper Dentures, Lower Dentures, Dental Advisory Given   Pulmonary shortness of breath, pneumonia, former smoker,    Pulmonary exam normal        Cardiovascular hypertension, + CAD, + Past MI and +CHF  + dysrhythmias Atrial Fibrillation + pacemaker  Rhythm:Irregular Rate:Normal  Echo 05/2018   Left Ventricle: The left ventricle has normal systolic function, with an ejection fraction of 60-65%. The cavity size was normal. There is no increase in left ventricular wall thickness. No evidence of left ventricular regional wall motion abnormalities. Right Ventricle: The right ventricle has normal systolic function. The cavity was normal. There is no increase in right ventricular wall thickness. Right ventricular systolic pressure is mildly elevated with an estimated pressure of 38.3 mmHg. Pacing  wire/catheter visualized in the right ventricle. Left Atrium: Left atrial size was mildly dilated. Right Atrium: Right atrial size was normal in size. Right atrial pressure is estimated at 3 mmHg. Interatrial Septum: No atrial level shunt detected by color flow Doppler. Pericardium: There is no evidence of pericardial effusion. Mitral Valve: The mitral valve is normal in structure. There is mild mitral annular calcification present. Tricuspid Valve: The tricuspid valve was normal in structure. Tricuspid valve regurgitation is mild by color flow Doppler. Aortic Valve: The aortic valve is tricuspid Aortic valve regurgitation was not visualized by color flow Doppler. Mild aortic annular calcification noted. Pulmonic Valve: The pulmonic valve was grossly normal. Pulmonic valve regurgitation is not  visualized by color flow Doppler. Aorta: The aortic root is normal in size and structure. Venous: The inferior vena cava is normal in size with greater than 50% respiratory variability.   Neuro/Psych PSYCHIATRIC DISORDERS Anxiety Depression CVA    GI/Hepatic negative GI ROS, Neg liver ROS,   Endo/Other  diabetesHypothyroidism   Renal/GU negative Renal ROS     Musculoskeletal  (+) Arthritis , Fibromyalgia -  Abdominal Normal abdominal exam  (+)   Peds  Hematology  (+) Blood dyscrasia, anemia ,   Anesthesia Other Findings   Reproductive/Obstetrics negative OB ROS                            Anesthesia Physical  Anesthesia Plan  ASA: III  Anesthesia Plan: General   Post-op Pain Management:    Induction: Intravenous  PONV Risk Score and Plan: Ondansetron, Dexamethasone and Treatment may vary due to age or medical condition  Airway Management Planned: Oral ETT  Additional Equipment: Arterial line  Intra-op Plan:   Post-operative Plan: Extubation in OR  Informed Consent: I have reviewed the patients History and Physical, chart, labs and discussed the procedure including the risks, benefits and alternatives for the proposed anesthesia with the patient or authorized representative who has indicated his/her understanding and acceptance.     Dental advisory given  Plan Discussed with: CRNA  Anesthesia Plan Comments: (Remi gtt)        Anesthesia Quick Evaluation

## 2018-06-03 NOTE — Interval H&P Note (Signed)
History and Physical Interval Note:  06/03/2018 12:58 PM  Meredith Gonzalez  has presented today for surgery, with the diagnosis of RIGHT CAROTID STENOSIS.  The various methods of treatment have been discussed with the patient and family. After consideration of risks, benefits and other options for treatment, the patient has consented to  Procedure(s): ENDARTERECTOMY CAROTID (Right) as a surgical intervention.  The patient's history has been reviewed, patient examined, no change in status, stable for surgery.  I have reviewed the patient's chart and labs.  Questions were answered to the patient's satisfaction.     Curt Jews

## 2018-06-03 NOTE — Anesthesia Procedure Notes (Deleted)
Performed by: Lance Coon, CRNA

## 2018-06-03 NOTE — Progress Notes (Signed)
Physical Therapy Treatment Patient Details Name: Meredith Gonzalez MRN: [id number] DOB: March 17, 1936 Today's Date: 06/03/2018    History of Present Illness Pt is an 83 y/o female admitted 05/30/2018 for suspected stroke but has not had MRI yet to confirm. PMH significant for recent stroke, DM, HTN, pacemaker, CHF, cervical fracture, femur fracture, recurrent falls.     PT Comments    Pt making steady progress with functional mobility. She continues to demonstrate instability with ambulation, requiring assistance with navigating in hallway and around obstacles. Pt would continue to benefit from skilled physical therapy services at this time while admitted and after d/c to address the below listed limitations in order to improve overall safety and independence with functional mobility.    Follow Up Recommendations  CIR;Supervision for mobility/OOB     Equipment Recommendations  None recommended by PT    Recommendations for Other Services       Precautions / Restrictions Precautions Precautions: Fall Precaution Comments: has fallen a lot with injuries over past 3 years recent in Sept broke her neck Restrictions Weight Bearing Restrictions: No    Mobility  Bed Mobility Overal bed mobility: Needs Assistance Bed Mobility: Supine to Sit;Sit to Supine     Supine to sit: Supervision Sit to supine: Supervision   General bed mobility comments: increased time and effort  Transfers Overall transfer level: Needs assistance Equipment used: Rolling walker (2 wheeled) Transfers: Sit to/from Stand Sit to Stand: Min guard         General transfer comment: min guard, cueing for safe hand placement  Ambulation/Gait Ambulation/Gait assistance: Min assist;Min guard Gait Distance (Feet): 75 Feet(75' x2 with two standing rest breaks) Assistive device: Rolling walker (2 wheeled) Gait Pattern/deviations: Step-through pattern;Decreased stride length;Trunk flexed Gait velocity: decreased    General Gait Details: pt with slow, cautious gait with use of RW; pt also requiring two standing rest breaks throughout; assistance for stability with navigating around obstacles in hallway   Stairs             Wheelchair Mobility    Modified Rankin (Stroke Patients Only) Modified Rankin (Stroke Patients Only) Pre-Morbid Rankin Score: Slight disability Modified Rankin: Moderately severe disability     Balance Overall balance assessment: Needs assistance;History of Falls Sitting-balance support: Feet supported Sitting balance-Leahy Scale: Fair     Standing balance support: Bilateral upper extremity supported;During functional activity Standing balance-Leahy Scale: Poor Standing balance comment: reliance on UE on RW                            Cognition Arousal/Alertness: Awake/alert Behavior During Therapy: WFL for tasks assessed/performed Overall Cognitive Status: Within Functional Limits for tasks assessed                                        Exercises      General Comments        Pertinent Vitals/Pain Pain Assessment: Faces Faces Pain Scale: Hurts little more Pain Location: neck Pain Descriptors / Indicators: Aching Pain Intervention(s): Monitored during session;Repositioned;Heat applied    Home Living                      Prior Function            PT Goals (current goals can now be found in the care plan section) Acute Rehab PT Goals PT  Goal Formulation: With patient Time For Goal Achievement: 06/07/18 Potential to Achieve Goals: Good Progress towards PT goals: Progressing toward goals    Frequency    Min 4X/week      PT Plan Current plan remains appropriate    Co-evaluation              AM-PAC PT "6 Clicks" Mobility   Outcome Measure  Help needed turning from your back to your side while in a flat bed without using bedrails?: A Little Help needed moving from lying on your back to sitting  on the side of a flat bed without using bedrails?: A Little Help needed moving to and from a bed to a chair (including a wheelchair)?: A Little Help needed standing up from a chair using your arms (e.g., wheelchair or bedside chair)?: A Little Help needed to walk in hospital room?: A Little Help needed climbing 3-5 steps with a railing? : A Lot 6 Click Score: 17    End of Session Equipment Utilized During Treatment: Gait belt Activity Tolerance: Patient tolerated treatment well Patient left: in bed;with call bell/phone within reach;with bed alarm set Nurse Communication: Mobility status PT Visit Diagnosis: Other abnormalities of gait and mobility (R26.89);History of falling (Z91.81)     Time: 2248-2500 PT Time Calculation (min) (ACUTE ONLY): 19 min  Charges:  $Gait Training: 8-22 mins                     Sherie Don, [place], DPT  Acute Rehabilitation Services Pager [phone] Office [place] 06/03/2018, 10:25 AM

## 2018-06-04 ENCOUNTER — Other Ambulatory Visit (HOSPITAL_BASED_OUTPATIENT_CLINIC_OR_DEPARTMENT_OTHER): Payer: Medicare Other

## 2018-06-04 ENCOUNTER — Encounter (HOSPITAL_COMMUNITY): Payer: Self-pay | Admitting: Vascular Surgery

## 2018-06-04 LAB — CBC
HCT: 30.3 % — ABNORMAL LOW (ref 36.0–46.0)
HEMOGLOBIN: 8.8 g/dL — AB (ref 12.0–15.0)
MCH: 28.9 pg (ref 26.0–34.0)
MCHC: 29 g/dL — AB (ref 30.0–36.0)
MCV: 99.3 fL (ref 80.0–100.0)
Platelets: 222 10*3/uL (ref 150–400)
RBC: 3.05 MIL/uL — ABNORMAL LOW (ref 3.87–5.11)
RDW: 19.9 % — ABNORMAL HIGH (ref 11.5–15.5)
WBC: 5.8 10*3/uL (ref 4.0–10.5)
nRBC: 0 % (ref 0.0–0.2)

## 2018-06-04 LAB — BASIC METABOLIC PANEL
Anion gap: 7 (ref 5–15)
BUN: 18 mg/dL (ref 8–23)
CO2: 22 mmol/L (ref 22–32)
Calcium: 7.5 mg/dL — ABNORMAL LOW (ref 8.9–10.3)
Chloride: 107 mmol/L (ref 98–111)
Creatinine, Ser: 0.79 mg/dL (ref 0.44–1.00)
GFR calc Af Amer: >60 mL/min (ref >60)
GFR calc non Af Amer: >60 mL/min (ref >60)
Glucose, Bld: 215 mg/dL — ABNORMAL HIGH (ref 70–99)
Potassium: 4.2 mmol/L (ref 3.5–5.1)
Sodium: 136 mmol/L (ref 135–145)

## 2018-06-04 LAB — GLUCOSE, CAPILLARY: Glucose-Capillary: 194 mg/dL — ABNORMAL HIGH (ref 70–99)

## 2018-06-04 MED ORDER — APIXABAN 5 MG PO TABS
5.0000 mg | ORAL_TABLET | Freq: Two times a day (BID) | ORAL | Status: DC
  Administered 2018-06-04 – 2018-06-05 (×2): 5 mg via ORAL
  Filled 2018-06-04 (×2): qty 1

## 2018-06-04 NOTE — Progress Notes (Signed)
PROGRESS NOTE    Meredith Gonzalez  IRJ:[id number] DOB: 1936/02/01 DOA: 05/30/2018 PCP: Sinclair Ship, MD     Brief Narrative:  Meredith Gonzalez is a 83 year old female, lives alone, ambulates with the help of a Rollator or walker, PMH of DM 2, HTN, hypothyroid, A. fib on Eliquis, CAD/MI, chronic diastolic CHF, PPM, CVA, anxiety and depression, presented to [facility] due to slurred speech, double vision, left-sided weakness and a fall that started on 05/28/2018.  She was subsequently admitted for possible small right brain stroke due to symptomatic right ICA stenosis.  Neurology and vascular surgery consulted.  Patient underwent right-sided CEA 3/11.  New events last 24 hours / Subjective: Doing well today.  Has some soreness of the neck on the right side at the site of surgical procedure.  Assessment & Plan:   Principal Problem:   Stroke [facility]) Active Problems:   Anemia   Persistent atrial fibrillation   Hypertension   Hypothyroidism   Diabetes ([place])   Pacemaker   CAD (coronary artery disease)   Chronic diastolic congestive heart failure (HCC)   Acute ischemic stroke (HCC)   Stroke-like symptom   Dyspnea   Acute blood loss anemia   Possible small right brain stroke -CT head: No acute intracranial pathology.  Small vessel white matter disease.  Redemonstrated fractures of the dens and C1 vertebral body -CTA of the head and neck: No emergent finding or change from January 2020.  Cervical carotid atherosclerosis with up to 60% stenosis at the right ICA bulb. -Unable to do MRI due to PPM.  Thereby repeated CT head 3/8: No appreciable infarct or intracranial hemorrhage -TTE: LVEF 60-65% -Carotid Doppler: Right 40-59% ICA stenosis.  Left 1-39% ICA stenosis.   -LDL 28.  A1c 5.7 -Neurology consulted. Patient presentation concerning for recurrent stroke due to symptomatic right ICA stenosis versus A. fib on lower dose of Eliquis with which she is compliant.  She has had 2 admissions with  similar presentations of left-sided weakness.  -Status post right CEA 3/11 -Resume Eliquis  Right carotid stenosis -Status post right CEA 3/11 Dr. Donnetta Hutching. Follow up in office in several weeks.   Paroxysmal A. Fib -Continue amiodarone -Patient started Eliquis after last stroke in January 2020.  She was on 2.5 mg twice daily.  Given recurrent stroke and room for increase, Eliquis dose increased to 5 mg twice daily. Resume eliquis   Type II DM, well controlled  -A1c 5.7  Hyperlipidemia -LDL 28, goal <70.  Atorvastatin  Essential hypertension -Well controlled BP   CAD -No anginal symptoms  Chronic diastolic CHF -Continue home dose of furosemide 10 mg daily.  Compensated.  Fracture of dens and C1 vertebral body -Noted on CT head, stable from CT spine 12/2017.  Outpatient follow-up with Dr. Vertell Limber, neurosurgery.    DVT prophylaxis: Eliquis  Code Status: DNR Family Communication: No family at bedside, spoke with son over the phone for update Disposition Plan: CIR evaluation pending   Consultants:   Neurology  Vascular surgery  Cardiology  Antimicrobials:  Anti-infectives (From admission, onward)   Start     Dose/Rate Route Frequency Ordered Stop   06/03/18 1900  ceFAZolin (ANCEF) IVPB 2g/100 mL premix     2 g 200 mL/hr over 30 Minutes Intravenous Every 8 hours 06/03/18 1750 06/04/18 0305   06/03/18 1345  ceFAZolin (ANCEF) IVPB 2g/100 mL premix  Status:  Discontinued     2 g 200 mL/hr over 30 Minutes Intravenous To [facility] 06/03/18 1343  06/03/18 1724   06/03/18 0600  ceFAZolin (ANCEF) IVPB 2g/100 mL premix    Note to Pharmacy:  Send with pt to OR   2 g 200 mL/hr over 30 Minutes Intravenous On call 06/02/18 0748 06/04/18 0600       Objective: Vitals:   06/04/18 0029 06/04/18 0410 06/04/18 1139 06/04/18 1155  BP: (!) 96/48 116/66  112/63  Pulse: 63 68  69  Resp: 13 15  16   Temp: (!) 97.4 F (36.3 C) 97.7 F (36.5 C)  97.8 F (36.6 C)  TempSrc:  Oral Oral  Oral  SpO2: 94% 95% 94% 98%  Weight:      Height:        Intake/Output Summary (Last 24 hours) at 06/04/2018 1204 Last data filed at 06/04/2018 0800 Gross per 24 hour  Intake 2474.59 ml  Output 645 ml  Net 1829.59 ml   Filed Weights   05/30/18 1750 06/03/18 1218  Weight: 60.5 kg 60.5 kg    Examination: General exam: Appears calm and comfortable  Respiratory system: Clear to auscultation. Respiratory effort normal. Cardiovascular system: S1 & S2 heard, RRR. No JVD, murmurs, rubs, gallops or clicks. No pedal edema. Gastrointestinal system: Abdomen is nondistended, soft and nontender. No organomegaly or masses felt. Normal bowel sounds heard. Central nervous system: Alert and oriented.  Extremities: Symmetric, LLE mildly weaker than RLE  Skin: No rashes, lesions or ulcers Psychiatry: Judgement and insight appear normal. Mood & affect appropriate.   Data Reviewed: I have personally reviewed following labs and imaging studies  CBC: Recent Labs  Lab 05/30/18 1413 05/31/18 1148 06/01/18 0458 06/02/18 0258 06/03/18 0412 06/04/18 0240  WBC 10.7* 6.5 6.3 6.8 5.2 5.8  NEUTROABS 8.8*  --   --   --   --   --   HGB 10.2* 9.1* 8.7* 9.4* 9.5* 8.8*  HCT 35.1* 31.1* 29.9* 31.7* 32.2* 30.3*  MCV 100.6* 99.7 100.0 98.8 98.5 99.3  PLT 234 207 200 210 214 818   Basic Metabolic Panel: Recent Labs  Lab 05/30/18 1413 05/31/18 1148 06/01/18 0458 06/03/18 0412 06/04/18 0240  NA 137 141 140 141 136  K 3.6 3.4* 3.8 3.5 4.2  CL 108 113* 112* 107 107  CO2 22 23 24 27 22   GLUCOSE 104* 100* 122* 101* 215*  BUN 29* 18 21 12 18   CREATININE 1.19* 0.80 0.64 0.68 0.79  CALCIUM 8.2* 8.1* 7.9* 8.3* 7.5*   GFR: Estimated Creatinine Clearance: 50.5 mL/min (by C-G formula based on SCr of 0.79 mg/dL). Liver Function Tests: Recent Labs  Lab 05/30/18 1413 05/31/18 1148  AST 19 17  ALT 18 16  ALKPHOS 102 90  BILITOT 0.7 0.7  PROT 7.3 5.8*  ALBUMIN 3.4* 2.6*   No results for  input(s): LIPASE, AMYLASE in the last 168 hours. No results for input(s): AMMONIA in the last 168 hours. Coagulation Profile: Recent Labs  Lab 05/30/18 1413 06/03/18 0412  INR 1.1 1.0   Cardiac Enzymes: No results for input(s): CKTOTAL, CKMB, CKMBINDEX, TROPONINI in the last 168 hours. BNP (last 3 results) No results for input(s): PROBNP in the last 8760 hours. HbA1C: No results for input(s): HGBA1C in the last 72 hours. CBG: Recent Labs  Lab 05/30/18 1410 06/03/18 1557 06/03/18 2053 06/04/18 0547  GLUCAP 87 100* 118* 194*   Lipid Profile: No results for input(s): CHOL, HDL, LDLCALC, TRIG, CHOLHDL, LDLDIRECT in the last 72 hours. Thyroid Function Tests: No results for input(s): TSH, T4TOTAL, FREET4, T3FREE, THYROIDAB in the  last 72 hours. Anemia Panel: No results for input(s): VITAMINB12, FOLATE, FERRITIN, TIBC, IRON, RETICCTPCT in the last 72 hours. Sepsis Labs: No results for input(s): PROCALCITON, LATICACIDVEN in the last 168 hours.  Recent Results (from the past 240 hour(s))  Surgical pcr screen     Status: Abnormal   Collection Time: 06/03/18  5:09 AM  Result Value Ref Range Status   MRSA, PCR POSITIVE (A) NEGATIVE Final    Comment: RESULT CALLED TO, READ BACK BY AND VERIFIED WITH: T.ADKERSON RN AT 0715 06/03/2018 BY A.DAVIS    Staphylococcus aureus POSITIVE (A) NEGATIVE Final    Comment: (NOTE) The Xpert SA Assay (FDA approved for NASAL specimens in patients 34 years of age and older), is one component of a comprehensive surveillance program. It is not intended to diagnose infection nor to guide or monitor treatment. Performed at [facility], [place] [address], [place], [place] [address]        Radiology Studies: No results found.    Scheduled Meds: . amiodarone  200 mg Oral BH-q7a  . aspirin EC  325 mg Oral Daily  . atorvastatin  20 mg Oral Daily  . calcium-vitamin D  1 tablet Oral BID  . Chlorhexidine Gluconate Cloth  6 each Topical Q0600  .  docusate sodium  100 mg Oral Daily  . ferrous sulfate  325 mg Oral BID WC  . furosemide  10 mg Oral Daily  . gabapentin  300 mg Oral q morning - 10a  . gabapentin  600 mg Oral QPM  . levothyroxine  50 mcg Oral Q0600  . mometasone-formoterol  2 puff Inhalation BID  . multivitamin with minerals  1 tablet Oral Daily  . mupirocin ointment  1 application Nasal BID  . pantoprazole  40 mg Oral Daily  . traZODone  25 mg Oral QHS  . vitamin B-12  1,000 mcg Oral Daily   Continuous Infusions: . lactated ringers    . lactated ringers 50 mL/hr at 06/04/18 0100     LOS: 4 days    Time spent: 25 minutes   Dessa Phi, DO [facility] [email or web address] 06/04/2018, 12:04 PM

## 2018-06-04 NOTE — Progress Notes (Signed)
IP rehab admissions - PT continuing to recommend CIR.  Will follow up in am for possible admit to inpatient rehab if patient is medically ready.  Call my partner, Urban Gibson, for questions at [phone].

## 2018-06-04 NOTE — Progress Notes (Signed)
IP rehab admissions - Awaiting updated therapy notes to determine potential need for inpatient rehab.  Will follow up once therapy sees patient.  Call me for questions.  [phone]

## 2018-06-04 NOTE — Progress Notes (Signed)
Patient ID: Meredith Gonzalez, female   DOB: 11-13-1935, 83 y.o.   MRN: [id number]  Progress Note    06/04/2018 10:23 AM 1 Day Post-Op  Subjective: Up in chair.  Comfortable.  Reports typical neck discomfort but no significant pain in her neck incision.   Vitals:   06/04/18 0029 06/04/18 0410  BP: (!) 96/48 116/66  Pulse: 63 68  Resp: 13 15  Temp: (!) 97.4 F (36.3 C) 97.7 F (36.5 C)  SpO2: 94% 95%   Physical Exam: Right neck without hematoma. Neuro grossly intact  CBC    Component Value Date/Time   WBC 5.8 06/04/2018 0240   RBC 3.05 (L) 06/04/2018 0240   HGB 8.8 (L) 06/04/2018 0240   HGB 10.2 (L) 05/19/2018 1612   HCT 30.3 (L) 06/04/2018 0240   HCT 32.2 (L) 05/19/2018 1612   PLT 222 06/04/2018 0240   PLT 286 05/19/2018 1612   MCV 99.3 06/04/2018 0240   MCV 91 05/19/2018 1612   MCH 28.9 06/04/2018 0240   MCHC 29.0 (L) 06/04/2018 0240   RDW 19.9 (H) 06/04/2018 0240   RDW 16.8 (H) 05/19/2018 1612   LYMPHSABS 0.8 05/30/2018 1413   MONOABS 1.0 05/30/2018 1413   EOSABS 0.1 05/30/2018 1413   BASOSABS 0.0 05/30/2018 1413    BMET    Component Value Date/Time   NA 136 06/04/2018 0240   NA 144 05/19/2018 1612   K 4.2 06/04/2018 0240   CL 107 06/04/2018 0240   CO2 22 06/04/2018 0240   GLUCOSE 215 (H) 06/04/2018 0240   BUN 18 06/04/2018 0240   BUN 15 05/19/2018 1612   CREATININE 0.79 06/04/2018 0240   CALCIUM 7.5 (L) 06/04/2018 0240   GFRNONAA >60 06/04/2018 0240   GFRAA >60 06/04/2018 0240    INR    Component Value Date/Time   INR 1.0 06/03/2018 0412     Intake/Output Summary (Last 24 hours) at 06/04/2018 1023 Last data filed at 06/04/2018 0700 Gross per 24 hour  Intake 2548.77 ml  Output 645 ml  Net 1903.77 ml     Assessment/Plan:  83 y.o. female stable from vascular standpoint postop day 1 from right carotid endarterectomy.  Plan per neurology and hospitalist.  Stable for discharge at any time from vascular standpoint.  We will see back in the office  in several weeks     Rosetta Posner, MD [facility] [phone] 06/04/2018 10:23 AM

## 2018-06-04 NOTE — Progress Notes (Signed)
[place] for apixaban Indication: atrial fibrillation with new CVA  Allergies  Allergen Reactions  . Valium [Diazepam] Anxiety    Makes patient hyper  . Codeine Hives and Itching    Can take with Benadryl   . Darifenacin Itching    Can take with Benadryl  . Darvon [Propoxyphene Hcl] Itching    Can take with Benadryl  . Daypro [Oxaprozin] Itching    Can take with Benadryl  . Enablex [Darifenacin Hydrobromide Er] Itching    Can take with Benadryl  . Oxycodone Itching    Can take with Benadryl   . Propoxyphene Itching    Can take with Benadryl  . Risperdal [Risperidone] Itching    Can take with Benadryl  . Talwin [Pentazocine] Itching    Can take with Benadryl  . Vicodin [Hydrocodone-Acetaminophen] Itching    Can take with Benadryl     Patient Measurements: Height: 5\' 5"  (165.1 cm) Weight: 133 lb 6.1 oz (60.5 kg) IBW/kg (Calculated) : 57 Heparin Dosing Weight: 60.5 kg  Vital Signs: Temp: 97.8 F (36.6 C) (03/12 1155) Temp Source: Oral (03/12 1155) BP: 112/63 (03/12 1155) Pulse Rate: 69 (03/12 1155)  Labs: Recent Labs    06/02/18 0258 06/02/18 1139 06/02/18 2003 06/03/18 0412 06/03/18 1630 06/04/18 0240  HGB 9.4*  --   --  9.5*  --  8.8*  HCT 31.7*  --   --  32.2*  --  30.3*  PLT 210  --   --  214  --  222  APTT 44* 47* 57* 129*  --   --   LABPROT  --   --   --  12.8  --   --   INR  --   --   --  1.0  --   --   HEPARINUNFRC 0.55  --  0.33 0.74* <0.10*  --   CREATININE  --   --   --  0.68  --  0.79    Estimated Creatinine Clearance: 50.5 mL/min (by C-G formula based on SCr of 0.79 mg/dL).   Medications:  Infusions:  . lactated ringers      Assessment: 83 yo F on Eliquis PTA for hx afib.  Pt admitted with unsteadiness, increased slurred speech, and double vision with possible CVA.Shedid not receive IV t-PA due to late presentation and Eliquis use.   Pharmacy consulted to dose apixaban. Dosing was discussed  earlier with Neuro. She was on apixaban 2.5mg  po bid. Weight is borderline for dose reduction and history of anemia. Due to stroke risk plans are to dose apixaban at the higher dose range -Hg= 8.8  Goal of Therapy:  Monitor platelets by anticoagulation protocol: Yes   Plan:  -Apixaban 5mg  po bid -Watch CBC; will check in a couple days if remains inpatient  Hildred Laser, PharmD Clinical Pharmacist **Pharmacist phone directory can now be found on [email or web address] ([email or web address]).  Listed under [place].

## 2018-06-04 NOTE — Anesthesia Postprocedure Evaluation (Signed)
Anesthesia Post Note  Patient: Eddye Broxterman  Procedure(s) Performed: ENDARTERECTOMY CAROTID (Right Neck) PATCH ANGIOPLASTY USING HEMASHIELD PLATINUM FINESSE (Right Neck)     Patient location during evaluation: PACU Anesthesia Type: General Level of consciousness: awake and alert Pain management: pain level controlled Vital Signs Assessment: post-procedure vital signs reviewed and stable Respiratory status: spontaneous breathing, nonlabored ventilation, respiratory function stable and patient connected to nasal cannula oxygen Cardiovascular status: blood pressure returned to baseline and stable Postop Assessment: no apparent nausea or vomiting Anesthetic complications: no    Last Vitals:  Vitals:   06/04/18 0029 06/04/18 0410  BP: (!) 96/48 116/66  Pulse: 63 68  Resp: 13 15  Temp: (!) 36.3 C 36.5 C  SpO2: 94% 95%    Last Pain:  Vitals:   06/04/18 0410  TempSrc: Oral  PainSc: 0-No pain                 Montez Hageman

## 2018-06-04 NOTE — Progress Notes (Signed)
06/03/2018 1730 Received pt to room 4E-09 from PACU S/P R CEA.  Pt is A&O, some slight C/O pain.  Tele monitor applied and CCMD notified.  CHG bath completed.  Oriented to room, call light and bed.  Call bell in reach.   Carney [place]ers

## 2018-06-04 NOTE — Discharge Instructions (Signed)

## 2018-06-04 NOTE — Progress Notes (Signed)
Physical Therapy Treatment Patient Details Name: Fraya Ueda MRN: [id number] DOB: 04/22/1935 Today's Date: 06/04/2018    History of Present Illness 83 y/o female admitted 05/30/2018 for suspected stroke but has not had MRI yet to confirm. PMH significant for recent stroke, DM, HTN, pacemaker, CHF, cervical fracture, femur fracture, recurrent falls.     PT Comments    Pt was seen for ROM and strengthening to BLE's with walk after using RW and encouraging control of speed and awareness of SOB.  Pt was able to walk with one sitting rest from the first walk, then did the 45' walk with one trip the second bout.  Follow acutely for progression of mobility and strength as tolerated, and continue to work toward CIR with monitoring of vitals, monitoring of safety due to buckling appearance of LLE with gait.   Follow Up Recommendations  CIR;Supervision for mobility/OOB     Equipment Recommendations  None recommended by PT    Recommendations for Other Services       Precautions / Restrictions Precautions Precautions: Fall(telemetry) Precaution Comments: many falls with Sept neck fracture Restrictions Weight Bearing Restrictions: No    Mobility  Bed Mobility Overal bed mobility: Needs Assistance             General bed mobility comments: up in chair when PT arrived  Transfers Overall transfer level: Needs assistance Equipment used: Rolling walker (2 wheeled) Transfers: Sit to/from Stand Sit to Stand: Min assist         General transfer comment: min assist from chair after ex's and reminders every trial for hand placement  Ambulation/Gait Ambulation/Gait assistance: Min assist Gait Distance (Feet): 40 Feet(x 2) Assistive device: Rolling walker (2 wheeled) Gait Pattern/deviations: Step-through pattern;Decreased stride length;Wide base of support(wide turns) Gait velocity: decreased   General Gait Details: cautious gait, used RW with cues for setup and to sit,     Stairs             Wheelchair Mobility    Modified Rankin (Stroke Patients Only) Modified Rankin (Stroke Patients Only) Pre-Morbid Rankin Score: Slight disability Modified Rankin: Moderately severe disability     Balance Overall balance assessment: Needs assistance;History of Falls Sitting-balance support: Feet supported Sitting balance-Leahy Scale: Fair Sitting balance - Comments: dense cues for postural control to sit up to prep standing   Standing balance support: Bilateral upper extremity supported;During functional activity Standing balance-Leahy Scale: Poor Standing balance comment: requires RW and cues for correction of initial standing posture                            Cognition Arousal/Alertness: Awake/alert Behavior During Therapy: WFL for tasks assessed/performed Overall Cognitive Status: Within Functional Limits for tasks assessed                                        Exercises General Exercises - Lower Extremity Ankle Circles/Pumps: AAROM;Both;5 reps Quad Sets: AROM;Both;10 reps Heel Slides: AROM;Both;10 reps Hip ABduction/ADduction: AROM;AAROM;Both;10 reps Straight Leg Raises: AROM;AAROM;Both;10 reps    General Comments General comments (skin integrity, edema, etc.): pt has noted O2 sats 100% pregait and post gait was 100% on room air, pulses were consistently in the 80's      Pertinent Vitals/Pain Pain Assessment: No/denies pain    Home Living  Prior Function            PT Goals (current goals can now be found in the care plan section) Acute Rehab PT Goals Patient Stated Goal: get home after rehab PT Goal Formulation: With patient Progress towards PT goals: Progressing toward goals    Frequency    Min 4X/week      PT Plan Current plan remains appropriate    Co-evaluation              AM-PAC PT "6 Clicks" Mobility   Outcome Measure  Help needed turning from  your back to your side while in a flat bed without using bedrails?: A Little Help needed moving from lying on your back to sitting on the side of a flat bed without using bedrails?: A Little Help needed moving to and from a bed to a chair (including a wheelchair)?: A Little Help needed standing up from a chair using your arms (e.g., wheelchair or bedside chair)?: A Little Help needed to walk in hospital room?: A Little Help needed climbing 3-5 steps with a railing? : A Lot 6 Click Score: 17    End of Session Equipment Utilized During Treatment: Gait belt Activity Tolerance: Patient tolerated treatment well Patient left: with call bell/phone within reach;in chair;with chair alarm set Nurse Communication: Mobility status PT Visit Diagnosis: Other abnormalities of gait and mobility (R26.89);History of falling (Z91.81) Hemiplegia - Right/Left: Left Hemiplegia - dominant/non-dominant: Non-dominant Hemiplegia - caused by: Cerebral infarction     Time: 5465-0354 PT Time Calculation (min) (ACUTE ONLY): 38 min  Charges:  $Gait Training: 8-22 mins $Therapeutic Exercise: 8-22 mins $Therapeutic Activity: 8-22 mins                     Ramond Dial 06/04/2018, 3:10 PM  Mee Hives, PT MS Acute Rehab Dept. Number: [place] [facility] [place]

## 2018-06-05 ENCOUNTER — Encounter (HOSPITAL_COMMUNITY): Payer: Self-pay

## 2018-06-05 ENCOUNTER — Other Ambulatory Visit: Payer: Self-pay

## 2018-06-05 ENCOUNTER — Inpatient Hospital Stay (HOSPITAL_COMMUNITY)
Admission: RE | Admit: 2018-06-05 | Discharge: 2018-06-24 | DRG: 057 | Disposition: A | Discharge status: Home Health | Payer: Medicare Other | Source: Intra-hospital | Attending: Physical Medicine & Rehabilitation | Admitting: Physical Medicine & Rehabilitation

## 2018-06-05 DIAGNOSIS — I110 Hypertensive heart disease with heart failure: Secondary | ICD-10-CM | POA: Diagnosis present

## 2018-06-05 DIAGNOSIS — R519 Headache, unspecified: Secondary | ICD-10-CM

## 2018-06-05 DIAGNOSIS — F419 Anxiety disorder, unspecified: Secondary | ICD-10-CM | POA: Diagnosis present

## 2018-06-05 DIAGNOSIS — I69398 Other sequelae of cerebral infarction: Secondary | ICD-10-CM | POA: Diagnosis not present

## 2018-06-05 DIAGNOSIS — I255 Ischemic cardiomyopathy: Secondary | ICD-10-CM | POA: Diagnosis not present

## 2018-06-05 DIAGNOSIS — E039 Hypothyroidism, unspecified: Secondary | ICD-10-CM | POA: Diagnosis present

## 2018-06-05 DIAGNOSIS — Z9884 Bariatric surgery status: Secondary | ICD-10-CM

## 2018-06-05 DIAGNOSIS — R296 Repeated falls: Secondary | ICD-10-CM | POA: Diagnosis present

## 2018-06-05 DIAGNOSIS — M797 Fibromyalgia: Secondary | ICD-10-CM | POA: Diagnosis present

## 2018-06-05 DIAGNOSIS — I63131 Cerebral infarction due to embolism of right carotid artery: Secondary | ICD-10-CM | POA: Diagnosis not present

## 2018-06-05 DIAGNOSIS — I69354 Hemiplegia and hemiparesis following cerebral infarction affecting left non-dominant side: Principal | ICD-10-CM

## 2018-06-05 DIAGNOSIS — E1142 Type 2 diabetes mellitus with diabetic polyneuropathy: Secondary | ICD-10-CM | POA: Diagnosis present

## 2018-06-05 DIAGNOSIS — J449 Chronic obstructive pulmonary disease, unspecified: Secondary | ICD-10-CM | POA: Diagnosis present

## 2018-06-05 DIAGNOSIS — I69391 Dysphagia following cerebral infarction: Secondary | ICD-10-CM | POA: Diagnosis not present

## 2018-06-05 DIAGNOSIS — D62 Acute posthemorrhagic anemia: Secondary | ICD-10-CM | POA: Diagnosis present

## 2018-06-05 DIAGNOSIS — Z950 Presence of cardiac pacemaker: Secondary | ICD-10-CM

## 2018-06-05 DIAGNOSIS — Z85828 Personal history of other malignant neoplasm of skin: Secondary | ICD-10-CM

## 2018-06-05 DIAGNOSIS — Z885 Allergy status to narcotic agent status: Secondary | ICD-10-CM

## 2018-06-05 DIAGNOSIS — D649 Anemia, unspecified: Secondary | ICD-10-CM | POA: Diagnosis present

## 2018-06-05 DIAGNOSIS — I252 Old myocardial infarction: Secondary | ICD-10-CM

## 2018-06-05 DIAGNOSIS — R269 Unspecified abnormalities of gait and mobility: Secondary | ICD-10-CM | POA: Diagnosis not present

## 2018-06-05 DIAGNOSIS — R4586 Emotional lability: Secondary | ICD-10-CM | POA: Diagnosis present

## 2018-06-05 DIAGNOSIS — Z9842 Cataract extraction status, left eye: Secondary | ICD-10-CM

## 2018-06-05 DIAGNOSIS — Z955 Presence of coronary angioplasty implant and graft: Secondary | ICD-10-CM

## 2018-06-05 DIAGNOSIS — Z8249 Family history of ischemic heart disease and other diseases of the circulatory system: Secondary | ICD-10-CM

## 2018-06-05 DIAGNOSIS — R1310 Dysphagia, unspecified: Secondary | ICD-10-CM | POA: Diagnosis present

## 2018-06-05 DIAGNOSIS — R51 Headache: Secondary | ICD-10-CM

## 2018-06-05 DIAGNOSIS — I509 Heart failure, unspecified: Secondary | ICD-10-CM | POA: Diagnosis present

## 2018-06-05 DIAGNOSIS — Z90710 Acquired absence of both cervix and uterus: Secondary | ICD-10-CM | POA: Diagnosis not present

## 2018-06-05 DIAGNOSIS — Z9181 History of falling: Secondary | ICD-10-CM

## 2018-06-05 DIAGNOSIS — I69390 Apraxia following cerebral infarction: Secondary | ICD-10-CM | POA: Diagnosis not present

## 2018-06-05 DIAGNOSIS — R7989 Other specified abnormal findings of blood chemistry: Secondary | ICD-10-CM | POA: Diagnosis not present

## 2018-06-05 DIAGNOSIS — H532 Diplopia: Secondary | ICD-10-CM | POA: Diagnosis present

## 2018-06-05 DIAGNOSIS — M4802 Spinal stenosis, cervical region: Secondary | ICD-10-CM | POA: Diagnosis present

## 2018-06-05 DIAGNOSIS — Z961 Presence of intraocular lens: Secondary | ICD-10-CM | POA: Diagnosis present

## 2018-06-05 DIAGNOSIS — M1990 Unspecified osteoarthritis, unspecified site: Secondary | ICD-10-CM | POA: Diagnosis present

## 2018-06-05 DIAGNOSIS — N179 Acute kidney failure, unspecified: Secondary | ICD-10-CM | POA: Diagnosis present

## 2018-06-05 DIAGNOSIS — I639 Cerebral infarction, unspecified: Secondary | ICD-10-CM | POA: Diagnosis not present

## 2018-06-05 DIAGNOSIS — I63139 Cerebral infarction due to embolism of unspecified carotid artery: Secondary | ICD-10-CM | POA: Diagnosis present

## 2018-06-05 DIAGNOSIS — I69393 Ataxia following cerebral infarction: Secondary | ICD-10-CM

## 2018-06-05 DIAGNOSIS — M542 Cervicalgia: Secondary | ICD-10-CM | POA: Diagnosis present

## 2018-06-05 DIAGNOSIS — Z9841 Cataract extraction status, right eye: Secondary | ICD-10-CM

## 2018-06-05 DIAGNOSIS — I4819 Other persistent atrial fibrillation: Secondary | ICD-10-CM

## 2018-06-05 DIAGNOSIS — I4891 Unspecified atrial fibrillation: Secondary | ICD-10-CM | POA: Diagnosis present

## 2018-06-05 DIAGNOSIS — G8929 Other chronic pain: Secondary | ICD-10-CM | POA: Diagnosis present

## 2018-06-05 DIAGNOSIS — Z7901 Long term (current) use of anticoagulants: Secondary | ICD-10-CM

## 2018-06-05 DIAGNOSIS — I10 Essential (primary) hypertension: Secondary | ICD-10-CM | POA: Diagnosis not present

## 2018-06-05 DIAGNOSIS — Z7951 Long term (current) use of inhaled steroids: Secondary | ICD-10-CM

## 2018-06-05 DIAGNOSIS — I6521 Occlusion and stenosis of right carotid artery: Secondary | ICD-10-CM | POA: Diagnosis present

## 2018-06-05 DIAGNOSIS — Z87891 Personal history of nicotine dependence: Secondary | ICD-10-CM

## 2018-06-05 DIAGNOSIS — Z811 Family history of alcohol abuse and dependence: Secondary | ICD-10-CM

## 2018-06-05 DIAGNOSIS — Z808 Family history of malignant neoplasm of other organs or systems: Secondary | ICD-10-CM

## 2018-06-05 DIAGNOSIS — Z79899 Other long term (current) drug therapy: Secondary | ICD-10-CM

## 2018-06-05 DIAGNOSIS — Z79890 Hormone replacement therapy (postmenopausal): Secondary | ICD-10-CM

## 2018-06-05 DIAGNOSIS — I482 Chronic atrial fibrillation, unspecified: Secondary | ICD-10-CM | POA: Diagnosis not present

## 2018-06-05 DIAGNOSIS — Z888 Allergy status to other drugs, medicaments and biological substances status: Secondary | ICD-10-CM

## 2018-06-05 LAB — GLUCOSE, CAPILLARY
Glucose-Capillary: 107 mg/dL — ABNORMAL HIGH (ref 70–99)
Glucose-Capillary: 131 mg/dL — ABNORMAL HIGH (ref 70–99)

## 2018-06-05 MED ORDER — PANTOPRAZOLE SODIUM 40 MG PO TBEC
40.0000 mg | DELAYED_RELEASE_TABLET | Freq: Every day | ORAL | Status: DC
  Administered 2018-06-06 – 2018-06-24 (×19): 40 mg via ORAL
  Filled 2018-06-05 (×19): qty 1

## 2018-06-05 MED ORDER — GABAPENTIN 300 MG PO CAPS
600.0000 mg | ORAL_CAPSULE | Freq: Every evening | ORAL | Status: DC
  Administered 2018-06-05 – 2018-06-23 (×19): 600 mg via ORAL
  Filled 2018-06-05 (×20): qty 2

## 2018-06-05 MED ORDER — ALUM & MAG HYDROXIDE-SIMETH 200-200-20 MG/5ML PO SUSP: 30.0000 mL | ORAL | Status: DC | PRN

## 2018-06-05 MED ORDER — INSULIN ASPART 100 UNIT/ML ~~LOC~~ SOLN
0.0000 [IU] | Freq: Every day | SUBCUTANEOUS | Status: DC
  Administered 2018-06-13: 2 [IU] via SUBCUTANEOUS

## 2018-06-05 MED ORDER — PROCHLORPERAZINE 25 MG RE SUPP: 12.5000 mg | Freq: Four times a day (QID) | RECTAL | Status: DC | PRN

## 2018-06-05 MED ORDER — METHOCARBAMOL 500 MG PO TABS
250.0000 mg | ORAL_TABLET | Freq: Three times a day (TID) | ORAL | Status: DC | PRN
  Administered 2018-06-17 – 2018-06-19 (×5): 250 mg via ORAL
  Filled 2018-06-05 (×6): qty 1

## 2018-06-05 MED ORDER — PROCHLORPERAZINE EDISYLATE 10 MG/2ML IJ SOLN: 5.0000 mg | Freq: Four times a day (QID) | INTRAMUSCULAR | Status: DC | PRN

## 2018-06-05 MED ORDER — INSULIN ASPART 100 UNIT/ML ~~LOC~~ SOLN
0.0000 [IU] | Freq: Three times a day (TID) | SUBCUTANEOUS | Status: DC
  Administered 2018-06-22: 1 [IU] via SUBCUTANEOUS

## 2018-06-05 MED ORDER — APIXABAN 5 MG PO TABS: 5.0000 mg | ORAL_TABLET | Freq: Two times a day (BID) | ORAL | 0 refills | Status: DC

## 2018-06-05 MED ORDER — FERROUS SULFATE 325 (65 FE) MG PO TABS
325.0000 mg | ORAL_TABLET | Freq: Two times a day (BID) | ORAL | Status: DC
  Administered 2018-06-05 – 2018-06-24 (×38): 325 mg via ORAL
  Filled 2018-06-05 (×38): qty 1

## 2018-06-05 MED ORDER — ACETAMINOPHEN 325 MG PO TABS
325.0000 mg | ORAL_TABLET | ORAL | Status: DC | PRN
  Administered 2018-06-05 – 2018-06-24 (×52): 650 mg via ORAL
  Filled 2018-06-05 (×54): qty 2

## 2018-06-05 MED ORDER — BISACODYL 10 MG RE SUPP: 10.0000 mg | Freq: Every day | RECTAL | Status: DC | PRN

## 2018-06-05 MED ORDER — ATORVASTATIN CALCIUM 10 MG PO TABS
20.0000 mg | ORAL_TABLET | Freq: Every day | ORAL | Status: DC
  Administered 2018-06-06 – 2018-06-24 (×19): 20 mg via ORAL
  Filled 2018-06-05 (×20): qty 2

## 2018-06-05 MED ORDER — GUAIFENESIN-DM 100-10 MG/5ML PO SYRP: 5.0000 mL | ORAL_SOLUTION | Freq: Four times a day (QID) | ORAL | Status: DC | PRN

## 2018-06-05 MED ORDER — ATORVASTATIN CALCIUM 20 MG PO TABS: 20.0000 mg | ORAL_TABLET | Freq: Every day | ORAL | 0 refills | Status: AC

## 2018-06-05 MED ORDER — CHLORHEXIDINE GLUCONATE CLOTH 2 % EX PADS: 6.0000 | MEDICATED_PAD | Freq: Every day | CUTANEOUS | Status: AC

## 2018-06-05 MED ORDER — MUPIROCIN 2 % EX OINT
1.0000 "application " | TOPICAL_OINTMENT | Freq: Two times a day (BID) | CUTANEOUS | Status: AC
  Administered 2018-06-05 – 2018-06-07 (×5): 1 via NASAL
  Filled 2018-06-05: qty 22

## 2018-06-05 MED ORDER — GABAPENTIN 300 MG PO CAPS
300.0000 mg | ORAL_CAPSULE | Freq: Every morning | ORAL | Status: DC
  Administered 2018-06-06 – 2018-06-24 (×19): 300 mg via ORAL
  Filled 2018-06-05 (×19): qty 1

## 2018-06-05 MED ORDER — DOCUSATE SODIUM 100 MG PO CAPS
100.0000 mg | ORAL_CAPSULE | Freq: Every day | ORAL | Status: DC
  Administered 2018-06-06 – 2018-06-24 (×18): 100 mg via ORAL
  Filled 2018-06-05 (×21): qty 1

## 2018-06-05 MED ORDER — CALCIUM CARBONATE-VITAMIN D 500-200 MG-UNIT PO TABS
1.0000 | ORAL_TABLET | Freq: Two times a day (BID) | ORAL | Status: DC
  Administered 2018-06-05 – 2018-06-24 (×38): 1 via ORAL
  Filled 2018-06-05 (×38): qty 1

## 2018-06-05 MED ORDER — APIXABAN 5 MG PO TABS
5.0000 mg | ORAL_TABLET | Freq: Two times a day (BID) | ORAL | Status: DC
  Administered 2018-06-05 – 2018-06-24 (×38): 5 mg via ORAL
  Filled 2018-06-05 (×38): qty 1

## 2018-06-05 MED ORDER — AMIODARONE HCL 200 MG PO TABS
200.0000 mg | ORAL_TABLET | ORAL | Status: DC
  Administered 2018-06-06 – 2018-06-24 (×19): 200 mg via ORAL
  Filled 2018-06-05 (×21): qty 1

## 2018-06-05 MED ORDER — FUROSEMIDE 20 MG PO TABS
10.0000 mg | ORAL_TABLET | Freq: Every day | ORAL | Status: DC
  Administered 2018-06-06: 10 mg via ORAL
  Filled 2018-06-05: qty 1

## 2018-06-05 MED ORDER — PHENOL 1.4 % MT LIQD: 1.0000 | OROMUCOSAL | Status: DC | PRN

## 2018-06-05 MED ORDER — FLUTICASONE PROPIONATE 50 MCG/ACT NA SUSP
1.0000 | Freq: Every day | NASAL | Status: DC | PRN
  Filled 2018-06-05: qty 16

## 2018-06-05 MED ORDER — FLEET ENEMA 7-19 GM/118ML RE ENEM: 1.0000 | ENEMA | Freq: Once | RECTAL | Status: DC | PRN

## 2018-06-05 MED ORDER — ADULT MULTIVITAMIN W/MINERALS CH
1.0000 | ORAL_TABLET | Freq: Every day | ORAL | Status: DC
  Administered 2018-06-06 – 2018-06-24 (×19): 1 via ORAL
  Filled 2018-06-05 (×19): qty 1

## 2018-06-05 MED ORDER — LEVOTHYROXINE SODIUM 50 MCG PO TABS
50.0000 ug | ORAL_TABLET | Freq: Every day | ORAL | Status: DC
  Administered 2018-06-06 – 2018-06-23 (×18): 50 ug via ORAL
  Filled 2018-06-05 (×18): qty 1

## 2018-06-05 MED ORDER — TRAZODONE HCL 50 MG PO TABS
25.0000 mg | ORAL_TABLET | Freq: Every evening | ORAL | Status: DC | PRN
  Filled 2018-06-05: qty 1

## 2018-06-05 MED ORDER — ALBUTEROL SULFATE (2.5 MG/3ML) 0.083% IN NEBU: 2.5000 mg | INHALATION_SOLUTION | Freq: Four times a day (QID) | RESPIRATORY_TRACT | Status: DC | PRN

## 2018-06-05 MED ORDER — DIPHENHYDRAMINE HCL 12.5 MG/5ML PO ELIX: 12.5000 mg | ORAL_SOLUTION | Freq: Four times a day (QID) | ORAL | Status: DC | PRN

## 2018-06-05 MED ORDER — TRAMADOL HCL 50 MG PO TABS: 50.0000 mg | ORAL_TABLET | Freq: Four times a day (QID) | ORAL | Status: DC | PRN

## 2018-06-05 MED ORDER — NITROGLYCERIN 0.4 MG SL SUBL: 0.4000 mg | SUBLINGUAL_TABLET | SUBLINGUAL | Status: DC | PRN

## 2018-06-05 MED ORDER — GUAIFENESIN-DM 100-10 MG/5ML PO SYRP: 15.0000 mL | ORAL_SOLUTION | ORAL | Status: DC | PRN

## 2018-06-05 MED ORDER — VITAMIN B-12 1000 MCG PO TABS
1000.0000 ug | ORAL_TABLET | Freq: Every day | ORAL | Status: DC
  Administered 2018-06-06 – 2018-06-24 (×19): 1000 ug via ORAL
  Filled 2018-06-05 (×19): qty 1

## 2018-06-05 MED ORDER — ALBUTEROL SULFATE HFA 108 (90 BASE) MCG/ACT IN AERS: 2.0000 | INHALATION_SPRAY | Freq: Four times a day (QID) | RESPIRATORY_TRACT | Status: DC | PRN

## 2018-06-05 MED ORDER — PROCHLORPERAZINE MALEATE 5 MG PO TABS: 5.0000 mg | ORAL_TABLET | Freq: Four times a day (QID) | ORAL | Status: DC | PRN

## 2018-06-05 MED ORDER — MOMETASONE FURO-FORMOTEROL FUM 200-5 MCG/ACT IN AERO
2.0000 | INHALATION_SPRAY | Freq: Two times a day (BID) | RESPIRATORY_TRACT | Status: DC
  Administered 2018-06-05 – 2018-06-23 (×34): 2 via RESPIRATORY_TRACT
  Filled 2018-06-05: qty 8.8

## 2018-06-05 MED ORDER — POLYETHYLENE GLYCOL 3350 17 G PO PACK: 17.0000 g | PACK | Freq: Every day | ORAL | Status: DC | PRN

## 2018-06-05 MED ORDER — ONDANSETRON HCL 4 MG/2ML IJ SOLN: 4.0000 mg | Freq: Four times a day (QID) | INTRAMUSCULAR | Status: DC | PRN

## 2018-06-05 NOTE — Progress Notes (Signed)
Meredith Staggers, MD  Physician  Physical Medicine and Rehabilitation  PMR Pre-admission  Signed  Date of Service:  06/05/2018 11:15 AM       Related encounter: ED to Hosp-Admission (Discharged) from 05/30/2018 in [facility] 4E CV SURGICAL PROGRESSIVE CARE      Signed         Show:Clear all _0 Manual_1 Template_2 Copied  Added by: _3 Michel Santee, PT  _4 Hover for details PMR Admission Coordinator Pre-Admission Assessment  Patient: Meredith Gonzalez is an 83 y.o., female MRN: [id number] DOB: [birth year] Height: _5  (165.1 cm) Weight: 60.5 kg                                                                                                                                                  Insurance Information HMO: no    PPO:      PCP:      IPA:      80/20:      OTHER:  PRIMARY: Medicare A and B      Policy#: [id number]      Subscriber: patient CM Name:       Phone#:      Fax#:  Pre-Cert#:  Verified via [employer] on 06/04/2018     Employer:  Benefits:  Phone #:      Name:  Eff. Date: A 06/24/1998, B 07/24/1999     Deduct: $1408      Out of Pocket Max: n/a      Life Max: n/a CIR: 100%      SNF: 20 full days Outpatient: 80%     Co-Pay: 20% Home Health:  100%      Co-Pay:  DME: 80%     Co-Pay: 20%  SECONDARY: [employer]      Policy#: [id number]      Subscriber: patient CM Name:       Phone#:      Fax#:  Pre-Cert#:       Employer:  Benefits:  Phone #: [phone]     Name:  Eff. Date:      Deduct:       Out of Pocket Max:       Life Max:  CIR:       SNF:  Outpatient:      Co-Pay:  Home Health:       Co-Pay:  DME:      Co-Pay:   Medicaid Application Date:       Case Manager:  Disability Application Date:       Case Worker:   Emergency Contact Information         Contact Information    Name Relation Home Work [place] Son   [phone]   Brieonna, [place] Daughter [phone]       Current Medical History  Patient Admitting Diagnosis: R  CVA History  of Present Illness: Meredith Gonzalez is an 83 year old female with history of CHF, HTN, cervical stenosis with chronic pain, T2DM, A fib s/p PPM, dyspnea likely due to COPD, CVA with recent CIR stay (d/c 05/07/18) 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 CTA head/neck negative for emergent findings and upto 60-65% with no wall abnormality. Carotid dopplers revealed R-40-59% ICA stenosis. Dr. Erlinda Hong felt that stroke was embolic likely due to A fib as patient with recurrent similar symptoms. Eliquis increased to 5 mg bid.  VVS consulted for input and recommended CEA due to "moderate to severe extremely irregular right bifurcation plaque". She underwent R-CEA with dacron patch angioplasty by Dr. Donnetta Hutching on 06/03/18 and Eliquis resumed yesterday.    Complete NIHSS TOTAL: 1  Past Medical History      Past Medical History:  Diagnosis Date  . Anemia   . Anxiety   . Arthritis    "all over" (02/11/2018)  . Basal cell carcinoma    "left leg" (02/11/2018)  . Cervical spine fracture ([place]) 12/2017   "C1-2"  . CHF (congestive heart failure) ([place])   . Chronic bronchitis ([place])   . Chronic neck pain    "since I broke my neck 6-8 wk ago" (02/11/2018)  . Diabetic peripheral neuropathy ([place])   . Fibromyalgia   . Fracture of right humerus   . Generalized weakness   . Headache    "weekly" (02/11/2018)  . History of blood transfusion    "related to one of my femur surgeries" (02/11/2018)  . History of echocardiogram    Echo 8/18: EF 50-55, no RWMA, Gr 1 DD, calcified AV leaflets, MAC, trivial MR, mod LAE, PASP 37  . Hypertension   . Hypothyroidism   . Incontinence of urine   . Ischemic stroke ([place]) 2000   "lost part of the vision in my right eye" (02/11/2018)  . Major depression, chronic   . Myocardial infarction ([place]) 06/2016  . Pacemaker   .  Recurrent falls   . Syncope and collapse   . Type 2 diabetes, diet controlled ([place])     Family History  family history includes Alcoholism in her brother, brother, father, and sister; Congestive Heart Failure in her mother; Heart attack in her father; Throat cancer in her brother.  Prior Rehab/Hospitalizations:  Has the patient had major surgery during 100 days prior to admission? Yes, Carotid Enarterectomy on 06/03/2018  Current Medications   Current Facility-Administered Medications:  .  acetaminophen (TYLENOL) tablet 650 mg, 650 mg, Oral, Q4H PRN, 650 mg at 06/05/18 1039 **OR** acetaminophen (TYLENOL) solution 650 mg, 650 mg, Per Tube, Q4H PRN **OR** acetaminophen (TYLENOL) suppository 650 mg, 650 mg, Rectal, Q4H PRN, Early, Arvilla Meres, MD .  albuterol (PROVENTIL) (2.5 MG/3ML) 0.083% nebulizer solution 2.5 mg, 2.5 mg, Nebulization, Q6H PRN, Early, Arvilla Meres, MD .  alum & mag hydroxide-simeth (MAALOX/MYLANTA) 200-200-20 MG/5ML suspension 15-30 mL, 15-30 mL, Oral, Q2H PRN, Early, Arvilla Meres, MD .  amiodarone (PACERONE) tablet 200 mg, 200 mg, Oral, Beaulah Corin, Todd F, MD, 200 mg at 06/05/18 0626 .  apixaban (ELIQUIS) tablet 5 mg, 5 mg, Oral, BID, Kris Mouton, RPH, 5 mg at 06/05/18 1031 .  atorvastatin (LIPITOR) tablet 20 mg, 20 mg, Oral, Daily, Early, Arvilla Meres, MD, 20 mg at 06/05/18 1039 .  calcium-vitamin D (OSCAL WITH D) 500-200 MG-UNIT per tablet 1 tablet, 1 tablet, Oral, BID, Early,  Arvilla Meres, MD, 1 tablet at 06/05/18 1031 .  Chlorhexidine Gluconate Cloth 2 % PADS 6 each, 6 each, Topical, Q0600, Early, Arvilla Meres, MD, 6 each at 06/03/18 1100 .  docusate sodium (COLACE) capsule 100 mg, 100 mg, Oral, Daily, Early, Arvilla Meres, MD, 100 mg at 06/05/18 1031 .  ferrous sulfate tablet 325 mg, 325 mg, Oral, BID WC, Early, Arvilla Meres, MD, 325 mg at 06/05/18 0906 .  fluticasone (FLONASE) 50 MCG/ACT nasal spray 1 spray, 1 spray, Each Nare, Daily PRN, Early, Arvilla Meres, MD .  furosemide (LASIX) tablet 10 mg, 10 mg,  Oral, Daily, Early, Arvilla Meres, MD, 10 mg at 06/05/18 1032 .  gabapentin (NEURONTIN) capsule 300 mg, 300 mg, Oral, q morning - 10a, Early, Arvilla Meres, MD, 300 mg at 06/05/18 1032 .  gabapentin (NEURONTIN) capsule 600 mg, 600 mg, Oral, QPM, Early, Arvilla Meres, MD, 600 mg at 06/04/18 2203 .  guaiFENesin-dextromethorphan (ROBITUSSIN DM) 100-10 MG/5ML syrup 15 mL, 15 mL, Oral, Q4H PRN, Early, Arvilla Meres, MD .  hydrALAZINE (APRESOLINE) injection 5 mg, 5 mg, Intravenous, Q20 Min PRN, Early, Todd F, MD .  labetalol (NORMODYNE,TRANDATE) injection 10 mg, 10 mg, Intravenous, Q10 min PRN, Early, Arvilla Meres, MD .  lactated ringers infusion, , Intravenous, Continuous, Early, Arvilla Meres, MD .  levothyroxine (SYNTHROID, LEVOTHROID) tablet 50 mcg, 50 mcg, Oral, Q0600, Early, Arvilla Meres, MD, 50 mcg at 06/05/18 0454 .  methocarbamol (ROBAXIN) tablet 250 mg, 250 mg, Oral, Q8H PRN, Early, Arvilla Meres, MD .  metoprolol tartrate (LOPRESSOR) injection 2-5 mg, 2-5 mg, Intravenous, Q2H PRN, Early, Arvilla Meres, MD .  mometasone-formoterol (DULERA) 200-5 MCG/ACT inhaler 2 puff, 2 puff, Inhalation, BID, Early, Arvilla Meres, MD, 2 puff at 06/05/18 0930 .  multivitamin with minerals tablet 1 tablet, 1 tablet, Oral, Daily, Early, Arvilla Meres, MD, 1 tablet at 06/05/18 1032 .  mupirocin ointment (BACTROBAN) 2 % 1 application, 1 application, Nasal, BID, Early, Arvilla Meres, MD, 1 application at 09/81/19 2203 .  nitroGLYCERIN (NITROSTAT) SL tablet 0.4 mg, 0.4 mg, Sublingual, Q5 min PRN, Early, Arvilla Meres, MD .  ondansetron (ZOFRAN) injection 4 mg, 4 mg, Intravenous, Q6H PRN, Early, Arvilla Meres, MD .  pantoprazole (PROTONIX) EC tablet 40 mg, 40 mg, Oral, Daily, Early, Arvilla Meres, MD, 40 mg at 06/05/18 1031 .  phenol (CHLORASEPTIC) mouth spray 1 spray, 1 spray, Mouth/Throat, PRN, Early, Arvilla Meres, MD .  senna-docusate (Senokot-S) tablet 1 tablet, 1 tablet, Oral, QHS PRN, Early, Arvilla Meres, MD .  traMADol (ULTRAM) tablet 50 mg, 50 mg, Oral, Q6H PRN, Early, Arvilla Meres, MD, 50 mg at 06/04/18 0230 .  vitamin B-12  (CYANOCOBALAMIN) tablet 1,000 mcg, 1,000 mcg, Oral, Daily, Early, Arvilla Meres, MD, 1,000 mcg at 06/05/18 1032  Patients Current Diet:     Diet Order                  Diet - low sodium heart healthy         Diet Heart Room service appropriate? Yes; Fluid consistency: Thin  Diet effective now               Precautions / Restrictions Precautions Precautions: Fall(telemetry) Precaution Comments: many falls with Sept neck fracture Restrictions Weight Bearing Restrictions: No   Has the patient had 2 or more falls or a fall with injury in the past year?Yes  Prior Activity Level Limited Community (1-2x/wk): was not driving following previous rehab stay in Feb 2020  [place] / Equipment  Home Assistive Devices/Equipment: Eyeglasses Home Equipment: Environmental consultant - 4 wheels, Environmental consultant - 2 wheels, Bedside commode, Shower seat - built in, [employer] - tub/shower  Prior Device Use: Indicate devices/aids used by the patient prior to current illness, exacerbation or injury? Walker  Prior Functional Level Prior Function Level of Independence: Independent with assistive device(s) Comments: used cane to help with balance till got to shopping cart when grocery shopping; rollator in the home  Self Care: Did the patient need help bathing, dressing, using the toilet or eating?  Independent  Indoor Mobility: Did the patient need assistance with walking from room to room (with or without device)? Independent  Stairs: Did the patient need assistance with internal or external stairs (with or without device)? Independent  Functional Cognition: Did the patient need help planning regular tasks such as shopping or remembering to take medications? Independent  Current Functional Level Cognition  Arousal/Alertness: Awake/alert Overall Cognitive Status: Within Functional Limits for tasks assessed Current Attention Level: Selective Orientation Level: Oriented X4  Safety/Judgement: Decreased awareness of deficits, Decreased awareness of safety General Comments: Increased processing time Attention: Focused, Sustained Focused Attention: Appears intact Sustained Attention: Appears intact Memory: Impaired Memory Impairment: Storage deficit, Retrieval deficit(Immediate: 3/3; Delayed: 2/3 ) Decreased Short Term Memory: Verbal complex Awareness: Appears intact Problem Solving: Appears intact Executive Function: Reasoning(2/2)    Extremity Assessment (includes Sensation/Coordination)  Upper Extremity Assessment: Generalized weakness, LUE deficits/detail LUE Deficits / Details: Strength 3+/5 in shoulder, elbow WFL. Decreased coordination but functional LUE Sensation: decreased light touch LUE Coordination: decreased fine motor  Lower Extremity Assessment: Defer to PT evaluation LLE Deficits / Details: decreased functional stregth througout LLE    ADLs  Overall ADL's : Needs assistance/impaired Eating/Feeding: Set up, Sitting Grooming: Supervision/safety, Sitting Grooming Details (indicate cue type and reason): min guard for balance when standing, set up assistance when sitting Upper Body Bathing: Supervision/ safety, Sitting Lower Body Bathing: Minimal assistance, Sit to/from stand Lower Body Bathing Details (indicate cue type and reason): pt uses LH sponge at home Upper Body Dressing : Supervision/safety, Sitting Lower Body Dressing: Moderate assistance, Sit to/from stand Lower Body Dressing Details (indicate cue type and reason): pt uses reacher and other AD at home, unable to reach feet Toilet Transfer: Minimal assistance, RW, Regular Toilet Toileting- Clothing Manipulation and Hygiene: Min guard, Sit to/from stand Functional mobility during ADLs: Minimal assistance, Rolling walker General ADL Comments: Pt limited by generalized weakness and balance deficits    Mobility  Overal bed mobility: Needs Assistance Bed Mobility: Supine to Sit,  Sit to Supine Supine to sit: Supervision Sit to supine: Supervision General bed mobility comments: up in chair when PT arrived    Transfers  Overall transfer level: Needs assistance Equipment used: Rolling walker (2 wheeled) Transfers: Sit to/from Stand Sit to Stand: Min assist Stand pivot transfers: Min assist General transfer comment: min assist from chair after ex's and reminders every trial for hand placement    Ambulation / Gait / Stairs / Wheelchair Mobility  Ambulation/Gait Ambulation/Gait assistance: Herbalist (Feet): 40 Feet(x 2) Assistive device: Rolling walker (2 wheeled) Gait Pattern/deviations: Step-through pattern, Decreased stride length, Wide base of support(wide turns) General Gait Details: cautious gait, used RW with cues for setup and to sit,  Gait velocity: decreased    Posture / Balance Dynamic Sitting Balance Sitting balance - Comments: dense cues for postural control to sit up to prep standing Balance Overall balance assessment: Needs assistance, History of Falls Sitting-balance support: Feet supported Sitting balance-Leahy Scale:  Fair Sitting balance - Comments: dense cues for postural control to sit up to prep standing Postural control: Posterior lean, Right lateral lean(no LOB, but lean present) Standing balance support: Bilateral upper extremity supported, During functional activity Standing balance-Leahy Scale: Poor Standing balance comment: requires RW and cues for correction of initial standing posture    Special needs/care consideration BiPAP/CPAP no  CPM no Continuous Drip IV no Dialysis no      Life Vest no Oxygen no Special Bed no Trach Size no Wound Vac (area) no  Skin  incision to R neck with glue, ecchymosis to buttock, abrasions to R ankle and back Bowel mgmt: continent, last BM 06/04/2018 Bladder mgmt: continent Diabetic mgmt diet controlled Pacemaker     Previous Home Environment Living Arrangements:  Alone  Lives With: Alone Available Help at Discharge: Family, Available PRN/intermittently Type of Home: House Home Layout: One level Home Access: Level entry Bathroom Shower/Tub: Walk-in shower(no threshold) Biochemist, clinical: Standard Bathroom Accessibility: Yes How Accessible: Accessible via walker, Accessible via wheelchair Home Care Services: No Additional Comments: [facility]", however son is making arrangements for her to potentially live in basement MIL suite  Discharge Living Setting Plans for Discharge Living Setting: Patient's home Type of Home at Discharge: House Discharge Home Layout: One level Discharge Home Access: Level entry Discharge Bathroom Shower/Tub: Walk-in shower(no threshold) Discharge Bathroom Toilet: Standard Discharge Bathroom Accessibility: Yes How Accessible: Accessible via wheelchair Does the patient have any problems obtaining your medications?: No  Social/Family/Support Systems Patient Roles: Parent Anticipated Caregiver: Pt's son, Lennette Bihari Anticipated Ambulance person Information: [phone] Ability/Limitations of Caregiver: pt's son lives near his mother, and he and his wife are prepared to stay with her 24/7 at d/c if needed Caregiver Availability: 24/7 Discharge Plan Discussed with Primary Caregiver: Yes Is Caregiver In Agreement with Plan?: Yes Does Caregiver/Family have Issues with Lodging/Transportation while Pt is in Rehab?: No   Goals/Additional Needs Patient/Family Goal for Rehab: OT/PT mod I, n/a SLP Expected length of stay: 7-10 days Cultural Considerations: none Dietary Needs: heart healthy, thin Equipment Needs: tbd Pt/Family Agrees to Admission and willing to participate: Yes Program Orientation Provided & Reviewed with Pt/Caregiver Including Roles  & Responsibilities: Yes   Decrease burden of Care through IP rehab admission: No    Possible need for SNF placement upon discharge: not  anticipated   Patient Condition: This patient's medical and functional status has changed since the consult dated: 06/01/2018 in which the Rehabilitation Physician determined and documented that the patient's condition is appropriate for intensive rehabilitative care in an inpatient rehabilitation facility. See "History of Present Illness" (above) for medical update. Functional changes are: pt continuing to demonstrate L hemiparesis and requiring min assist for ambulation up to 40' following CEA on 06/03/2018. Patient's medical and functional status update has been discussed with the Rehabilitation physician and patient remains appropriate for inpatient rehabilitation. Will admit to inpatient rehab today.  Preadmission Screen Completed By:  Michel Santee, 06/05/2018 11:16 AM ______________________________________________________________________   Discussed status with Dr. Naaman Plummer on 06/05/18 at 11:26 AM and received telephone approval for admission today.  Admission Coordinator:  Michel Santee, time 11:26 AM Sudie Grumbling 06/05/18            Revision History

## 2018-06-05 NOTE — Discharge Summary (Signed)
Physician Discharge Summary  Meredith Gonzalez PIR:[id number] DOB: January 15, 1936 DOA: 05/30/2018  PCP: Sinclair Ship, MD  Admit date: 05/30/2018 Discharge date: 06/05/2018  Admitted From: Home Disposition:  CIR   Recommendations for Outpatient Follow-up:  1. Follow up with PCP in 1 week 2. Follow up with Vascular Surgery Dr. Donnetta Hutching in 2-3 weeks  3. Follow up with Neurology on 06/25/2018 as scheduled  4. Follow up with Neurosurgery Dr. Vertell Limber to follow up fracture of dens and C1 vertebral body  5. Follow up with Cardiology Dr. Harrington Challenger   Discharge Condition: Stable CODE STATUS: DNR  Diet recommendation: Heart healthy   Brief/Interim Summary: Meredith Gonzalez is a 83 year old female, lives alone, ambulates with the help of a Rollator or walker, PMH of DM 2, HTN, hypothyroid, A. fib on Eliquis, CAD/MI, chronic diastolic CHF, PPM, CVA, anxiety and depression, presented to [place] due to slurred speech, double vision, left-sided weakness and a fall that started on 05/28/2018. She was subsequently admitted for possible small right brain stroke due to symptomaticright ICA stenosis. Neurology and vascular surgery consulted.  Patient underwent right-sided CEA 3/11.  Discharge Diagnoses:  Principal Problem:   Stroke [facility]) Active Problems:   Anemia   Persistent atrial fibrillation   Hypertension   Hypothyroidism   Diabetes ([place])   Pacemaker   CAD (coronary artery disease)   Chronic diastolic congestive heart failure (HCC)   Acute ischemic stroke (HCC)   Stroke-like symptom   Dyspnea   Acute blood loss anemia  Possible small right brain stroke -CT head: No acute intracranial pathology. Small vessel white matter disease. Redemonstrated fractures of the dens and C1 vertebral body -CTA of the head and neck: No emergent finding or change from January 2020. Cervical carotid atherosclerosis with up to 60% stenosis at the right ICA bulb. -Unable to do MRI due to PPM. Thereby repeated CT head 3/8: No  appreciable infarct or intracranial hemorrhage -TTE: LVEF 60-65% -Carotid Doppler: Right 40-59% ICA stenosis. Left 1-39% ICA stenosis.  -LDL 28. A1c 5.7 -Neurology consulted. Patient presentation concerning for recurrent stroke due to symptomatic right ICA stenosis versus A. fib on lower dose of Eliquis with which she is compliant. She has had 2 admissions with similar presentations of left-sided weakness.  -Status post right CEA 3/11 -Resume Eliquis -Follow up with Neurology   Right carotid stenosis -Status post right CEA 3/11 Dr. Donnetta Hutching. Follow up in office in several weeks.   Paroxysmal A. Fib -Continue amiodarone -Patient started Eliquis after last stroke in January 2020. She was on 2.5 mg twice daily. Given recurrent stroke and room for increase, Eliquis dose increased to 5 mg twice daily. Resume eliquis  -Follow up with Cardiology   Type II DM, well controlled  -A1c 5.7  Hyperlipidemia -LDL 28, goal <70. Continue atorvastatin  Essential hypertension -Well controlled BP   CAD -No anginal symptoms  Chronic diastolic CHF -Continue home dose of furosemide 10 mg daily. Compensated.  Fracture of dens and C1 vertebral body -Noted on CT head, stable from CT spine 12/2017. Outpatient follow-up with Dr. Vertell Limber, neurosurgery.  Discharge Instructions  Discharge Instructions    Diet - low sodium heart healthy   Complete by:  As directed    Increase activity slowly   Complete by:  As directed      Allergies as of 06/05/2018      Reactions   Valium [diazepam] Anxiety   Makes patient hyper   Codeine Hives, Itching   Can take with Benadryl  Darifenacin Itching   Can take with Benadryl   Darvon [propoxyphene Hcl] Itching   Can take with Benadryl   Daypro [oxaprozin] Itching   Can take with Benadryl   Enablex [darifenacin Hydrobromide Er] Itching   Can take with Benadryl   Oxycodone Itching   Can take with Benadryl   Propoxyphene Itching   Can take with  Benadryl   Risperdal [risperidone] Itching   Can take with Benadryl   Talwin [pentazocine] Itching   Can take with Benadryl   Vicodin [hydrocodone-acetaminophen] Itching   Can take with Benadryl      Medication List    TAKE these medications   acetaminophen 500 MG tablet Commonly known as:  TYLENOL Take 500 mg by mouth every 6 (six) hours as needed for moderate pain.   albuterol 108 (90 Base) MCG/ACT inhaler Commonly known as:  PROVENTIL HFA;VENTOLIN HFA Inhale 2 puffs into the lungs every 6 (six) hours as needed for wheezing or shortness of breath.   amiodarone 200 MG tablet Commonly known as:  PACERONE Take 1 tablet (200 mg total) by mouth every morning. What changed:  when to take this   apixaban 5 MG Tabs tablet Commonly known as:  ELIQUIS Take 1 tablet (5 mg total) by mouth 2 (two) times daily. What changed:    medication strength  how much to take   atorvastatin 20 MG tablet Commonly known as:  LIPITOR Take 1 tablet (20 mg total) by mouth daily for 30 days. What changed:    medication strength  how much to take   budesonide-formoterol 160-4.5 MCG/ACT inhaler Commonly known as:  Symbicort Inhale 2 puffs into the lungs 2 (two) times daily. What changed:  Another medication with the same name was removed. Continue taking this medication, and follow the directions you see here.   Calcium Citrate-Vitamin D3 315-250 MG-UNIT Tabs Take 1 tablet by mouth 2 (two) times daily.   ferrous sulfate 325 (65 FE) MG tablet Take 1 tablet (325 mg total) by mouth 2 (two) times daily with a meal.   flintstones complete 60 MG chewable tablet Chew 1 tablet by mouth daily.   fluticasone 50 MCG/ACT nasal spray Commonly known as:  FLONASE Place 1 spray into both nostrils daily as needed (allergies.).   furosemide 20 MG tablet Commonly known as:  LASIX Take 0.5 tablets (10 mg total) by mouth daily.   gabapentin 300 MG capsule Commonly known as:  NEURONTIN Take 1-2  capsules (300-600 mg total) by mouth See admin instructions. Take 1 capsule (300 mg) by mouth in the mornning & take 2 capsules (600 mg) by mouth at night What changed:  additional instructions   levothyroxine 50 MCG tablet Commonly known as:  SYNTHROID, LEVOTHROID Take 1 tablet (50 mcg total) by mouth daily at 6 (six) AM for 30 days.   methocarbamol 500 MG tablet Commonly known as:  ROBAXIN Take 0.5 tablets (250 mg total) by mouth every 8 (eight) hours as needed for muscle spasms.   nitroGLYCERIN 0.4 MG SL tablet Commonly known as:  NITROSTAT Place 1 tablet (0.4 mg total) under the tongue every 5 (five) minutes as needed for chest pain.   vitamin B-12 1000 MCG tablet Commonly known as:  CYANOCOBALAMIN Take 1 tablet (1,000 mcg total) by mouth daily.   VITAMIN D3 PO Take 1 tablet by mouth 2 (two) times daily.      Follow-up Information    Sinclair Ship, MD. Schedule an appointment as soon as possible for a visit  today.   Specialty:  Internal Medicine Contact information: [address] [place] [address] [phone]        Venancio Poisson, NP. Go on 06/25/2018.   Specialty:  Neurology Contact information: [place] [address] [phone]        Fay Records, MD Follow up.   Specialty:  Cardiology Why:  Office will make appointment for follow up  Contact information: [place] [address] [phone]        Erline Levine, MD Follow up.   Specialty:  Neurosurgery Contact information: [address] [place] [address] [place] [address] [phone]        Rosetta Posner, MD. Schedule an appointment as soon as possible for a visit in 2 week(s).   Specialties:  Vascular Surgery, Cardiology Contact information: [address] [place] [address] [phone]          Allergies  Allergen Reactions  . Valium [Diazepam] Anxiety    Makes patient hyper  . Codeine Hives and  Itching    Can take with Benadryl   . Darifenacin Itching    Can take with Benadryl  . Darvon [Propoxyphene Hcl] Itching    Can take with Benadryl  . Daypro [Oxaprozin] Itching    Can take with Benadryl  . Enablex [Darifenacin Hydrobromide Er] Itching    Can take with Benadryl  . Oxycodone Itching    Can take with Benadryl   . Propoxyphene Itching    Can take with Benadryl  . Risperdal [Risperidone] Itching    Can take with Benadryl  . Talwin [Pentazocine] Itching    Can take with Benadryl  . Vicodin [Hydrocodone-Acetaminophen] Itching    Can take with Benadryl     Consultations:  Neurology  Vascular Surgery  Cardiology    Procedures/Studies: Ct Angio Head W Or Wo Contrast  Result Date: 05/30/2018 CLINICAL DATA:  Left-sided weakness and ataxia EXAM: CT ANGIOGRAPHY HEAD AND NECK TECHNIQUE: Multidetector CT imaging of the head and neck was performed using the standard protocol during bolus administration of intravenous contrast. Multiplanar CT image reconstructions and MIPs were obtained to evaluate the vascular anatomy. Carotid stenosis measurements (when applicable) are obtained utilizing NASCET criteria, using the distal internal carotid diameter as the denominator. CONTRAST:  15mL ISOVUE-370 IOPAMIDOL (ISOVUE-370) INJECTION 76% COMPARISON:  CTA head neck 04/24/2018 FINDINGS: CTA NECK FINDINGS Aortic arch: Atherosclerotic plaque.  Three vessel branching. Right carotid system: Bulky mixed density plaque at the ICA bulb stenosis measuring up to 60%. No ulceration or beading. Left carotid system: Calcified plaque without flow limiting stenosis or ulceration. Vertebral arteries: Proximal subclavian atherosclerotic calcification without stenosis. Both vertebral arteries are smooth and widely patent to the dura. Dominant left vertebral artery. Skeleton: Remote C1 ring fracture known from 2019 imaging. No fracture displacement. Advanced cervical spine degeneration. Other neck: No  significant incidental finding Upper chest: Patchy streaky density in the bilateral apex was also seen previously, compatible with scarring or pneumonitis. Review of the MIP images confirms the above findings CTA HEAD FINDINGS Anterior circulation: Atherosclerotic calcification of the carotid siphons. No branch occlusion, flow limiting stenosis, beading, or aneurysm. Posterior circulation: Left dominant vertebral artery. The vertebral and basilar arteries are smooth and widely patent. No branch occlusion or flow limiting stenosis. Negative for aneurysm. Venous sinuses: Patent Anatomic variants: None significant Delayed phase: No abnormal intracranial enhancement Review of the MIP images confirms the above findings IMPRESSION: 1. No emergent finding  or change from January 2020. 2. Cervical carotid atherosclerosis with up to 60% stenosis at the right ICA bulb. 3. Chronic lung disease. Electronically Signed   By: Monte Fantasia M.D.   On: 05/30/2018 15:32   Ct Head Wo Contrast  Result Date: 05/31/2018 CLINICAL DATA:  Stroke follow-up this eat (left-sided weakness and ataxia EXAM: CT HEAD WITHOUT CONTRAST TECHNIQUE: Contiguous axial images were obtained from the base of the skull through the vertex without intravenous contrast. COMPARISON:  05/30/2018 FINDINGS: Brain: No evidence of acute infarction, hemorrhage, hydrocephalus, extra-axial collection or mass lesion/mass effect. Mild for age chronic small vessel ischemia in the cerebral white matter. Age congruent cerebral volume loss. 2 small sulcal or cortical calcification in the right parietal region that is stable from at least 12/28/2017 Vascular: Atherosclerotic calcification Skull: Remote C1 ring fractures with unchanged alignment on cervical CT yesterday. Sinuses/Orbits: Bilateral cataract resection IMPRESSION: No appreciable infarct or intracranial hemorrhage. Electronically Signed   By: Monte Fantasia M.D.   On: 05/31/2018 13:12   Ct Head Wo  Contrast  Result Date: 05/30/2018 CLINICAL DATA:  Slurred speech, ataxia, left-sided weakness EXAM: CT HEAD WITHOUT CONTRAST TECHNIQUE: Contiguous axial images were obtained from the base of the skull through the vertex without intravenous contrast. COMPARISON:  05/15/2018, CT neck, 04/24/2018 FINDINGS: Brain: No evidence of acute infarction, hemorrhage, hydrocephalus, extra-axial collection or mass lesion/mass effect. Periventricular white matter hypodensity. Vascular: No hyperdense vessel or unexpected calcification. Skull: Normal. Negative for fracture or focal lesion. Sinuses/Orbits: No acute finding. Other: Redemonstrated fractures of the dens and C1 vertebral body, better evaluated by prior CT examination of the neck. IMPRESSION: 1. No acute intracranial pathology. Small-vessel white matter disease. 2. Redemonstrated fractures of the dens and C1 vertebral body, better evaluated by prior CT examination of the neck. Electronically Signed   By: Eddie Candle M.D.   On: 05/30/2018 15:11   Ct Angio Neck W And/or Wo Contrast  Result Date: 05/30/2018 CLINICAL DATA:  Left-sided weakness and ataxia EXAM: CT ANGIOGRAPHY HEAD AND NECK TECHNIQUE: Multidetector CT imaging of the head and neck was performed using the standard protocol during bolus administration of intravenous contrast. Multiplanar CT image reconstructions and MIPs were obtained to evaluate the vascular anatomy. Carotid stenosis measurements (when applicable) are obtained utilizing NASCET criteria, using the distal internal carotid diameter as the denominator. CONTRAST:  49mL ISOVUE-370 IOPAMIDOL (ISOVUE-370) INJECTION 76% COMPARISON:  CTA head neck 04/24/2018 FINDINGS: CTA NECK FINDINGS Aortic arch: Atherosclerotic plaque.  Three vessel branching. Right carotid system: Bulky mixed density plaque at the ICA bulb stenosis measuring up to 60%. No ulceration or beading. Left carotid system: Calcified plaque without flow limiting stenosis or ulceration.  Vertebral arteries: Proximal subclavian atherosclerotic calcification without stenosis. Both vertebral arteries are smooth and widely patent to the dura. Dominant left vertebral artery. Skeleton: Remote C1 ring fracture known from 2019 imaging. No fracture displacement. Advanced cervical spine degeneration. Other neck: No significant incidental finding Upper chest: Patchy streaky density in the bilateral apex was also seen previously, compatible with scarring or pneumonitis. Review of the MIP images confirms the above findings CTA HEAD FINDINGS Anterior circulation: Atherosclerotic calcification of the carotid siphons. No branch occlusion, flow limiting stenosis, beading, or aneurysm. Posterior circulation: Left dominant vertebral artery. The vertebral and basilar arteries are smooth and widely patent. No branch occlusion or flow limiting stenosis. Negative for aneurysm. Venous sinuses: Patent Anatomic variants: None significant Delayed phase: No abnormal intracranial enhancement Review of the MIP images confirms the above findings IMPRESSION:  1. No emergent finding or change from January 2020. 2. Cervical carotid atherosclerosis with up to 60% stenosis at the right ICA bulb. 3. Chronic lung disease. Electronically Signed   By: Monte Fantasia M.D.   On: 05/30/2018 15:32   Vas US Carotid (at [place] Only)  Result Date: 06/01/2018 Carotid Arterial Duplex Study Indications:       CTA of neck shows 60% right ICA stenosis. Comparison Study:  Prior study from 04/24/18 is available for comparison Performing Technologist: Sharion Dove RVS  Examination Guidelines: A complete evaluation includes B-mode imaging, spectral Doppler, color Doppler, and power Doppler as needed of all accessible portions of each vessel. Bilateral testing is considered an integral part of a complete examination. Limited examinations for reoccurring indications may be performed as noted.  Right Carotid Findings:  +----------+--------+--------+--------+---------------------+------------------+           PSV cm/sEDV cm/sStenosisDescribe             Comments           +----------+--------+--------+--------+---------------------+------------------+ CCA Prox  80      19                                   intimal thickening +----------+--------+--------+--------+---------------------+------------------+ CCA Distal78      18                                   intimal thickening +----------+--------+--------+--------+---------------------+------------------+ ICA Prox  134     39      40-59%  calcific and         Shadowing                                            irregular                               +----------+--------+--------+--------+---------------------+------------------+ ICA Mid   125     38                                                      +----------+--------+--------+--------+---------------------+------------------+ ICA Distal130     30                                                      +----------+--------+--------+--------+---------------------+------------------+ ECA       163     14                                                      +----------+--------+--------+--------+---------------------+------------------+ +----------+--------+-------+--------+-------------------+           PSV cm/sEDV cmsDescribeArm Pressure (mmHG) +----------+--------+-------+--------+-------------------+ Subclavian102                                        +----------+--------+-------+--------+-------------------+ +---------+--------+--+--------+--+  VertebralPSV cm/s44EDV cm/s12 +---------+--------+--+--------+--+  Left Carotid Findings: +----------+--------+--------+--------+--------+------------------+           PSV cm/sEDV cm/sStenosisDescribeComments           +----------+--------+--------+--------+--------+------------------+ CCA Prox  135      28                      intimal thickening +----------+--------+--------+--------+--------+------------------+ CCA Distal81      28                      intimal thickening +----------+--------+--------+--------+--------+------------------+ ICA Prox  93      33              calcificShadowing          +----------+--------+--------+--------+--------+------------------+ ICA Distal78      25                                         +----------+--------+--------+--------+--------+------------------+ ECA       120     22                                         +----------+--------+--------+--------+--------+------------------+ +----------+--------+--------+--------+-------------------+ SubclavianPSV cm/sEDV cm/sDescribeArm Pressure (mmHG) +----------+--------+--------+--------+-------------------+           160                                         +----------+--------+--------+--------+-------------------+ +---------+--------+--+--------+--+ VertebralPSV cm/s55EDV cm/s14 +---------+--------+--+--------+--+  Summary: Right Carotid: Velocities in the right ICA are consistent with a 40-59%                stenosis. Left Carotid: Velocities in the left ICA are consistent with a 1-39% stenosis. Vertebrals:  Bilateral vertebral arteries demonstrate antegrade flow. Subclavians: Normal flow hemodynamics were seen in bilateral subclavian              arteries. *See table(s) above for measurements and observations.  Electronically signed by Antony Contras MD on 06/01/2018 at 8:53:18 AM.    Final     Echo IMPRESSIONS  1. Limited study.  2. The left ventricle has normal systolic function, with an ejection fraction of 60-65%. The cavity size was normal. No evidence of left ventricular regional wall motion abnormalities. Diastolic function was not assessed.  3. The right ventricle has normal systolc function. The cavity was normal. There is no increase in right ventricular wall  thickness. Right ventricular systolic pressure is mildly elevated with an estimated pressure of 38.3 mmHg. Device wire present.  4. Left atrial size was mildly dilated.  5. The aortic valve is tricuspid Mild aortic annular calcification noted.  6. The mitral valve is normal in structure. There is mild mitral annular calcification present.  7. The tricuspid valve was normal in structure.  8. The aortic root is normal in size and structure.    Discharge Exam: Vitals:   06/05/18 0610 06/05/18 0930  BP: 113/63   Pulse: 67   Resp: 12   Temp: 97.7 F (36.5 C)   SpO2: 94% 94%    General: Pt is alert, awake, not in acute distress, slightly groggy but conversational and oriented appropriately  Cardiovascular: RRR, S1/S2 +, no rubs, no gallops  Respiratory: CTA bilaterally, no wheezing, no rhonchi Abdominal: Soft, NT, ND, bowel sounds + Extremities: no edema, no cyanosis    The results of significant diagnostics from this hospitalization (including imaging, microbiology, ancillary and laboratory) are listed below for reference.     Microbiology: Recent Results (from the past 240 hour(s))  Surgical pcr screen     Status: Abnormal   Collection Time: 06/03/18  5:09 AM  Result Value Ref Range Status   MRSA, PCR POSITIVE (A) NEGATIVE Final    Comment: RESULT CALLED TO, READ BACK BY AND VERIFIED WITH: T.ADKERSON RN AT 0715 06/03/2018 BY A.DAVIS    Staphylococcus aureus POSITIVE (A) NEGATIVE Final    Comment: (NOTE) The Xpert SA Assay (FDA approved for NASAL specimens in patients 23 years of age and older), is one component of a comprehensive surveillance program. It is not intended to diagnose infection nor to guide or monitor treatment. Performed at [facility], [place] [address], [place], [place] [address]      Labs: BNP (last 3 results) Recent Labs    02/10/18 1519  BNP 469.6*   Basic Metabolic Panel: Recent Labs  Lab 05/30/18 1413 05/31/18 1148 06/01/18 0458  06/03/18 0412 06/04/18 0240  NA 137 141 140 141 136  K 3.6 3.4* 3.8 3.5 4.2  CL 108 113* 112* 107 107  CO2 22 23 24 27 22   GLUCOSE 104* 100* 122* 101* 215*  BUN 29* 18 21 12 18   CREATININE 1.19* 0.80 0.64 0.68 0.79  CALCIUM 8.2* 8.1* 7.9* 8.3* 7.5*   Liver Function Tests: Recent Labs  Lab 05/30/18 1413 05/31/18 1148  AST 19 17  ALT 18 16  ALKPHOS 102 90  BILITOT 0.7 0.7  PROT 7.3 5.8*  ALBUMIN 3.4* 2.6*   No results for input(s): LIPASE, AMYLASE in the last 168 hours. No results for input(s): AMMONIA in the last 168 hours. CBC: Recent Labs  Lab 05/30/18 1413 05/31/18 1148 06/01/18 0458 06/02/18 0258 06/03/18 0412 06/04/18 0240  WBC 10.7* 6.5 6.3 6.8 5.2 5.8  NEUTROABS 8.8*  --   --   --   --   --   HGB 10.2* 9.1* 8.7* 9.4* 9.5* 8.8*  HCT 35.1* 31.1* 29.9* 31.7* 32.2* 30.3*  MCV 100.6* 99.7 100.0 98.8 98.5 99.3  PLT 234 207 200 210 214 222   Cardiac Enzymes: No results for input(s): CKTOTAL, CKMB, CKMBINDEX, TROPONINI in the last 168 hours. BNP: Invalid input(s): POCBNP CBG: Recent Labs  Lab 05/30/18 1410 06/03/18 1557 06/03/18 2053 06/04/18 0547  GLUCAP 87 100* 118* 194*   D-Dimer No results for input(s): DDIMER in the last 72 hours. Hgb A1c No results for input(s): HGBA1C in the last 72 hours. Lipid Profile No results for input(s): CHOL, HDL, LDLCALC, TRIG, CHOLHDL, LDLDIRECT in the last 72 hours. Thyroid function studies No results for input(s): TSH, T4TOTAL, T3FREE, THYROIDAB in the last 72 hours.  Invalid input(s): FREET3 Anemia work up No results for input(s): VITAMINB12, FOLATE, FERRITIN, TIBC, IRON, RETICCTPCT in the last 72 hours. Urinalysis No results found for: COLORURINE, APPEARANCEUR, [place], [place], [place], [place], [place], [place], PROTEINUR, UROBILINOGEN, NITRITE, LEUKOCYTESUR Sepsis Labs Invalid input(s): PROCALCITONIN,  WBC,  LACTICIDVEN Microbiology Recent Results (from the past 240 hour(s))  Surgical pcr screen      Status: Abnormal   Collection Time: 06/03/18  5:09 AM  Result Value Ref Range Status   MRSA, PCR POSITIVE (A) NEGATIVE Final    Comment: RESULT CALLED TO, READ BACK BY AND VERIFIED  WITH: T.ADKERSON RN AT 0715 06/03/2018 BY A.DAVIS    Staphylococcus aureus POSITIVE (A) NEGATIVE Final    Comment: (NOTE) The Xpert SA Assay (FDA approved for NASAL specimens in patients 37 years of age and older), is one component of a comprehensive surveillance program. It is not intended to diagnose infection nor to guide or monitor treatment. Performed at [facility], [place] [address] [place], [place] [address]      Patient was seen and examined on the day of discharge and was found to be in stable condition. Time coordinating discharge: 35 minutes including assessment and coordination of care, as well as examination of the patient.   SIGNED:  Dessa Phi, DO [facility] [email or web address] 06/05/2018, 10:36 AM

## 2018-06-05 NOTE — H&P (Signed)
Physical Medicine and Rehabilitation Admission H&P        Chief Complaint  Patient presents with  . Functional decline due to stroke      HPI:  Meredith Gonzalez is an 83 year old female with history of CHF, HTN, cervical stenosis with chronic pain, T2DM, A fib s/p PPM, dyspnea likely due to COPD, CVA with recent CIR stay (d/c 05/07/18) 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 CTA head/neck negative for emergent findings and upto 60-65% with no wall abnormality. Carotid dopplers revealed R-40-59% ICA stenosis. Dr. Erlinda Hong felt that stroke was embolic likely due to A fib as patient with recurrent similar symptoms. Eliquis increased to 5 mg bid.   VVS consulted for input and recommended CEA due to "moderate to severe extremely irregular right bifurcation plaque".  She underwent R-CEA with dacron patch angioplasty by Dr. Donnetta Hutching on 06/03/18 and Eliquis resumed yesterday. Post op reporting issues with dysphagia. She continues to be limited by dizziness, ataxia with balance deficits and left sided weakness. Therapy ongoing and patient continues to be limited by      Review of Systems  Constitutional: Negative for chills and fever.  HENT: Negative for hearing loss and tinnitus.   Eyes: Positive for double vision. Negative for blurred vision.  Respiratory: Negative for cough and shortness of breath.   Cardiovascular: Negative for chest pain and palpitations.  Gastrointestinal: Negative for heartburn and nausea.       Sore throat/difficulty swallowing since yesterday  Genitourinary: Negative for dysuria and urgency.  Musculoskeletal: Positive for falls, myalgias and neck pain (due to fracture last fall).  Skin: Negative for itching and rash.  Neurological: Positive for dizziness, speech change, focal weakness and headaches.  Psychiatric/Behavioral: The patient does not have insomnia.            Past Medical History:  Diagnosis Date  . Anemia    . Anxiety    . Arthritis      "all over" (02/11/2018)  . Basal cell carcinoma      "left leg" (02/11/2018)  . Cervical spine fracture ([place]) 12/2017    "C1-2"  . CHF (congestive heart failure) ([place])    . Chronic bronchitis ([place])    . Chronic neck pain      "since I broke my neck 6-8 wk ago" (02/11/2018)  . Diabetic peripheral neuropathy ([place])    . Fibromyalgia    . Fracture of right humerus    . Generalized weakness    . Headache      "weekly" (02/11/2018)  . History of blood transfusion      "related to one of my femur surgeries" (02/11/2018)  . History of echocardiogram      Echo 8/18: EF 50-55, no RWMA, Gr 1 DD, calcified AV leaflets, MAC, trivial MR, mod LAE, PASP 37  . Hypertension    . Hypothyroidism    . Incontinence of urine    . Ischemic stroke ([place]) 2000    "lost part of the vision in my right eye" (02/11/2018)  . Major depression, chronic    . Myocardial infarction ([place]) 06/2016  . Pacemaker    . Recurrent falls    . Syncope and collapse    . Type 2 diabetes, diet controlled ([place])             Past Surgical History:  Procedure Laterality Date  .  ABDOMINAL HYSTERECTOMY      . ANKLE FRACTURE SURGERY Right    . BASAL CELL CARCINOMA EXCISION Left      "leg"  . CARDIAC CATHETERIZATION   ~ 2014  . CARDIOVERSION N/A 02/13/2018    Procedure: CARDIOVERSION;  Surgeon: Sueanne Margarita, MD;  Location: [facility] ENDOSCOPY;  Service: Cardiovascular;  Laterality: N/A;  . CARDIOVERSION N/A 03/27/2018    Procedure: CARDIOVERSION;  Surgeon: Lelon Perla, MD;  Location: [facility] ENDOSCOPY;  Service: Cardiovascular;  Laterality: N/A;  . CARPAL TUNNEL RELEASE        bilateral  . CATARACT EXTRACTION W/ INTRAOCULAR LENS  IMPLANT, BILATERAL Bilateral    . CHOLECYSTECTOMY OPEN      . COLONOSCOPY      . CORONARY ANGIOPLASTY WITH STENT PLACEMENT   06/2016  . CORONARY STENT INTERVENTION N/A 07/19/2016    Procedure: Coronary Stent  Intervention;  Surgeon: Peter M Martinique, MD;  Location: [place] CV LAB;  Service: Cardiovascular;  Laterality: N/A;  . ENDARTERECTOMY Right 06/03/2018    Procedure: ENDARTERECTOMY CAROTID;  Surgeon: Rosetta Posner, MD;  Location: [place];  Service: Vascular;  Laterality: Right;  . EYE SURGERY        right eye catarace/lens implant  . FEMUR FRACTURE SURGERY Bilateral    . FRACTURE SURGERY      . GASTRIC BYPASS      . INSERT / REPLACE / REMOVE PACEMAKER   ~ 2012  . LEAD REVISION N/A 01/01/2013    Procedure: LEAD REVISION;  Surgeon: Evans Lance, MD;  Location: [facility] CATH LAB;  Service: Cardiovascular;  Laterality: N/A;  . LEFT HEART CATH AND CORONARY ANGIOGRAPHY N/A 07/19/2016    Procedure: Left Heart Cath and Coronary Angiography;  Surgeon: Peter M Martinique, MD;  Location: [place] CV LAB;  Service: Cardiovascular;  Laterality: N/A;  . OVARIAN CYST SURGERY        "one side only"  . PATCH ANGIOPLASTY Right 06/03/2018    Procedure: PATCH ANGIOPLASTY USING HEMASHIELD PLATINUM FINESSE;  Surgeon: Rosetta Posner, MD;  Location: [place];  Service: Vascular;  Laterality: Right;  . TEE WITHOUT CARDIOVERSION N/A 02/13/2018    Procedure: TRANSESOPHAGEAL ECHOCARDIOGRAM (TEE);  Surgeon: Sueanne Margarita, MD;  Location: [facility] ENDOSCOPY;  Service: Cardiovascular;  Laterality: N/A;  . TONSILLECTOMY               Family History  Problem Relation Age of Onset  . Congestive Heart Failure Mother    . Heart attack Father    . Alcoholism Father    . Alcoholism Sister    . Alcoholism Brother    . Alcoholism Brother    . Throat cancer Brother        Social History: Lives alone in [place] facility. Retired Automotive engineer. Her son helps with home management and errands couple of times a week. She   reports that she quit smoking about 45 years ago. Her smoking use included cigarettes. She has a 2.00 pack-year smoking history. She has never used smokeless tobacco. She reports current alcohol use. She reports that  she does not use drugs.          Allergies  Allergen Reactions  . Valium [Diazepam] Anxiety      Makes patient hyper  . Codeine Hives and Itching      Can take with Benadryl    . Darifenacin Itching      Can take with Benadryl  . Darvon [Propoxyphene Hcl] Itching  Can take with Benadryl  . Daypro [Oxaprozin] Itching      Can take with Benadryl  . Enablex [Darifenacin Hydrobromide Er] Itching      Can take with Benadryl  . Oxycodone Itching      Can take with Benadryl    . Propoxyphene Itching      Can take with Benadryl  . Risperdal [Risperidone] Itching      Can take with Benadryl  . Talwin [Pentazocine] Itching      Can take with Benadryl  . Vicodin [Hydrocodone-Acetaminophen] Itching      Can take with Benadryl              Medications Prior to Admission  Medication Sig Dispense Refill  . acetaminophen (TYLENOL) 500 MG tablet Take 500 mg by mouth every 6 (six) hours as needed for moderate pain.       Marland Kitchen albuterol (PROVENTIL HFA;VENTOLIN HFA) 108 (90 Base) MCG/ACT inhaler Inhale 2 puffs into the lungs every 6 (six) hours as needed for wheezing or shortness of breath. 1 Inhaler 1  . amiodarone (PACERONE) 200 MG tablet Take 1 tablet (200 mg total) by mouth every morning. (Patient taking differently: Take 200 mg by mouth daily. ) 90 tablet 3  . apixaban (ELIQUIS) 2.5 MG TABS tablet Take 1 tablet (2.5 mg total) by mouth 2 (two) times daily. 60 tablet 1  . atorvastatin (LIPITOR) 40 MG tablet Take 1 tablet (40 mg total) by mouth daily. 90 tablet 3  . budesonide-formoterol (SYMBICORT) 160-4.5 MCG/ACT inhaler Inhale 2 puffs into the lungs 2 (two) times daily. 1 Inhaler 5  . Calcium Citrate-Vitamin D3 315-250 MG-UNIT TABS Take 1 tablet by mouth 2 (two) times daily.      . Cholecalciferol (VITAMIN D3 PO) Take 1 tablet by mouth 2 (two) times daily.      . ferrous sulfate 325 (65 FE) MG tablet Take 1 tablet (325 mg total) by mouth 2 (two) times daily with a meal. 60 tablet 0  .  flintstones complete (FLINTSTONES) 60 MG chewable tablet Chew 1 tablet by mouth daily.      . fluticasone (FLONASE) 50 MCG/ACT nasal spray Place 1 spray into both nostrils daily as needed (allergies.).    4  . furosemide (LASIX) 20 MG tablet Take 0.5 tablets (10 mg total) by mouth daily. 30 tablet 1  . gabapentin (NEURONTIN) 300 MG capsule Take 1-2 capsules (300-600 mg total) by mouth See admin instructions. Take 1 capsule (300 mg) by mouth in the mornning & take 2 capsules (600 mg) by mouth at night (Patient taking differently: Take 300-600 mg by mouth See admin instructions. Take 1 capsule (300 mg) by mouth in the morning & take 2 capsules (600 mg) by mouth at night) 90 capsule 0  . levothyroxine (SYNTHROID, LEVOTHROID) 50 MCG tablet Take 1 tablet (50 mcg total) by mouth daily at 6 (six) AM for 30 days. 30 tablet 0  . vitamin B-12 (CYANOCOBALAMIN) 1000 MCG tablet Take 1 tablet (1,000 mcg total) by mouth daily. 30 tablet 0  . budesonide-formoterol (SYMBICORT) 160-4.5 MCG/ACT inhaler Inhale 2 puffs into the lungs 2 (two) times daily for 1 day. (Patient not taking: Reported on 05/30/2018) 1 Inhaler 0  . methocarbamol (ROBAXIN) 500 MG tablet Take 0.5 tablets (250 mg total) by mouth every 8 (eight) hours as needed for muscle spasms. (Patient not taking: Reported on 05/30/2018) 60 tablet 0  . nitroGLYCERIN (NITROSTAT) 0.4 MG SL tablet Place 1 tablet (0.4 mg total) under  the tongue every 5 (five) minutes as needed for chest pain. (Patient not taking: Reported on 05/30/2018) 25 tablet 2      Drug Regimen Review  Drug regimen was reviewed and remains appropriate with no significant issues identified   Home: Home Living Family/patient expects to be discharged to:: Private residence Living Arrangements: Alone Available Help at Discharge: Family, Available PRN/intermittently Type of Home: House Home Access: Level entry Home Layout: One level Bathroom Shower/Tub: Walk-in shower(no threshold) Biochemist, clinical:  Standard Bathroom Accessibility: Yes Home Equipment: Environmental consultant - 4 wheels, Walker - 2 wheels, Bedside commode, Shower seat - built in, [employer] - tub/shower Additional Comments: Lives in [facility]", however son is making arrangements for her to potentially live in basement MIL suite  Lives With: Alone   Functional History: Prior Function Level of Independence: Independent with assistive device(s) Comments: used cane to help with balance till got to shopping cart when grocery shopping; rollator in the home   Functional Status:  Mobility: Bed Mobility Overal bed mobility: Needs Assistance Bed Mobility: Supine to Sit, Sit to Supine Supine to sit: Supervision Sit to supine: Supervision General bed mobility comments: up in chair when PT arrived Transfers Overall transfer level: Needs assistance Equipment used: Rolling walker (2 wheeled) Transfers: Sit to/from Stand Sit to Stand: Min assist Stand pivot transfers: Min assist General transfer comment: min assist from chair after ex's and reminders every trial for hand placement Ambulation/Gait Ambulation/Gait assistance: Min assist Gait Distance (Feet): 40 Feet(x 2) Assistive device: Rolling walker (2 wheeled) Gait Pattern/deviations: Step-through pattern, Decreased stride length, Wide base of support(wide turns) General Gait Details: cautious gait, used RW with cues for setup and to sit,  Gait velocity: decreased   ADL: ADL Overall ADL's : Needs assistance/impaired Eating/Feeding: Set up, Sitting Grooming: Supervision/safety, Sitting Grooming Details (indicate cue type and reason): min guard for balance when standing, set up assistance when sitting Upper Body Bathing: Supervision/ safety, Sitting Lower Body Bathing: Minimal assistance, Sit to/from stand Lower Body Bathing Details (indicate cue type and reason): pt uses LH sponge at home Upper Body Dressing : Supervision/safety, Sitting Lower Body Dressing: Moderate  assistance, Sit to/from stand Lower Body Dressing Details (indicate cue type and reason): pt uses reacher and other AD at home, unable to reach feet Toilet Transfer: Minimal assistance, RW, Regular Toilet Toileting- Clothing Manipulation and Hygiene: Min guard, Sit to/from stand Functional mobility during ADLs: Minimal assistance, Rolling walker General ADL Comments: Pt limited by generalized weakness and balance deficits   Cognition: Cognition Overall Cognitive Status: Within Functional Limits for tasks assessed Arousal/Alertness: Awake/alert Orientation Level: Oriented X4 Attention: Focused, Sustained Focused Attention: Appears intact Sustained Attention: Appears intact Memory: Impaired Memory Impairment: Storage deficit, Retrieval deficit(Immediate: 3/3; Delayed: 2/3 ) Decreased Short Term Memory: Verbal complex Awareness: Appears intact Problem Solving: Appears intact Executive Function: Reasoning(2/2) Cognition Arousal/Alertness: Awake/alert Behavior During Therapy: WFL for tasks assessed/performed Overall Cognitive Status: Within Functional Limits for tasks assessed Area of Impairment: Safety/judgement, Problem solving Current Attention Level: Selective Safety/Judgement: Decreased awareness of deficits, Decreased awareness of safety Problem Solving: Slow processing General Comments: Increased processing time     Blood pressure 113/63, pulse 67, temperature 97.7 F (36.5 C), temperature source Oral, resp. rate 12, height _0  (1.651 m), weight 60.5 kg, SpO2 94 %. Physical Exam  Nursing note and vitals reviewed. Constitutional: She is oriented to person, place, and time. She appears well-developed and well-nourished.  HENT:  Head: Normocephalic and atraumatic.  Eyes: Pupils are equal, round,  and reactive to light. EOM are normal.  Neck: Normal range of motion. No tracheal deviation present. No thyromegaly present.  Cardiovascular: Normal rate and regular rhythm. Exam  reveals no friction rub.  No murmur heard. Respiratory: Effort normal. No respiratory distress. She has no wheezes.  GI: Soft. Bowel sounds are normal. She exhibits no distension. There is no abdominal tenderness.  Musculoskeletal: Normal range of motion.  Neurological: She is alert and oriented to person, place, and time.  Mildly ataxic speech. Intermittent diplopia reported. LUE 3/5 prox to 4/5 distally with impaired FMC. LLE grossly 4/5. RUE and RLE 5/5. No focal sensory findings are appreciated. DTR's 3+ LUE and LLE and 2+ RUE/RLE. Mild left inattention, apraxia  Skin: Skin is warm and dry. She is not diaphoretic.  Psychiatric: She has a normal mood and affect. Her behavior is normal. Judgment and thought content normal.      Lab Results Last 48 Hours        Results for orders placed or performed during the hospital encounter of 05/30/18 (from the past 48 hour(s))  Glucose, capillary     Status: Abnormal    Collection Time: 06/03/18  3:57 PM  Result Value Ref Range    Glucose-Capillary 100 (H) 70 - 99 mg/dL    Comment 1 Notify RN      Comment 2 Document in Chart    Heparin level (unfractionated)     Status: Abnormal    Collection Time: 06/03/18  4:30 PM  Result Value Ref Range    Heparin Unfractionated <0.10 (L) 0.30 - 0.70 IU/mL      Comment: REPEATED TO VERIFY (NOTE) If heparin results are below expected values, and patient dosage has  been confirmed, suggest follow up testing of antithrombin III levels. Performed at [facility], [place] [address] [place], [place] [address]    Glucose, capillary     Status: Abnormal    Collection Time: 06/03/18  8:53 PM  Result Value Ref Range    Glucose-Capillary 118 (H) 70 - 99 mg/dL  Basic metabolic panel     Status: Abnormal    Collection Time: 06/04/18  2:40 AM  Result Value Ref Range    Sodium 136 135 - 145 mmol/L    Potassium 4.2 3.5 - 5.1 mmol/L    Chloride 107 98 - 111 mmol/L    CO2 22 22 - 32 mmol/L    Glucose, Bld 215  (H) 70 - 99 mg/dL    BUN 18 8 - 23 mg/dL    Creatinine, Ser 0.79 0.44 - 1.00 mg/dL    Calcium 7.5 (L) 8.9 - 10.3 mg/dL    GFR calc non Af Amer >60 >60 mL/min    GFR calc Af Amer >60 >60 mL/min    Anion gap 7 5 - 15      Comment: Performed at [facility], [place] [address], [place], [place] [address]  CBC     Status: Abnormal    Collection Time: 06/04/18  2:40 AM  Result Value Ref Range    WBC 5.8 4.0 - 10.5 K/uL    RBC 3.05 (L) 3.87 - 5.11 MIL/uL    Hemoglobin 8.8 (L) 12.0 - 15.0 g/dL    HCT 30.3 (L) 36.0 - 46.0 %    MCV 99.3 80.0 - 100.0 fL    MCH 28.9 26.0 - 34.0 pg    MCHC 29.0 (L) 30.0 - 36.0 g/dL    RDW 19.9 (H) 11.5 - 15.5 %  Platelets 222 150 - 400 K/uL    nRBC 0.0 0.0 - 0.2 %      Comment: Performed at [facility], [place] [address] [place], [place] [address]  Glucose, capillary     Status: Abnormal    Collection Time: 06/04/18  5:47 AM  Result Value Ref Range    Glucose-Capillary 194 (H) 70 - 99 mg/dL      Imaging Results (Last 48 hours)  No results found.           Medical Problem List and Plan: 1.  Functional and mobility deficits secondary to embolic right cortical infarct             -admit to inpatient rehab 2.  Antithrombotics: -DVT/anticoagulation:  Pharmaceutical: Other (comment)--Eliquis             -antiplatelet therapy: N/A 3. Chronic neck pain/Pain Management: On gabapentin tid with robaxin prn             -consider kpad if needed 4. Mood: LCSW to follow for evaluation and support.              -antipsychotic agents: N/A 5. Neuropsych: This patient is capable of making decisions on her own behalf. 6. Skin/Wound Care: Routine pressure relief measures.  7. Fluids/Electrolytes/Nutrition: Monitor I/O. Check lytes in am. 8. HTN: Monitor BP bid-on low dose lasix.  9. T2DM with peripheral neuropathy: Hgb A1c- 5.7. Diet controlled.  10 A fib s/p PPM: Monitor HR bid--continue amiodarone and Eliquis 11. DOE/Likely COPD: Recently started on  Dulera. Resume albuterol prn  12. R-CEA s/p CEA 3/11: Indicating dysphagia--will order ST.  13. Anemia: On iron for supplement          Bary Leriche, PA-C 06/05/2018  Post Admission Physician Evaluation: 1. Functional deficits secondary  to embolic right cortical infarct. 2. Patient is admitted to receive collaborative, interdisciplinary care between the physiatrist, rehab nursing staff, and therapy team. 3. Patient's level of medical complexity and substantial therapy needs in context of that medical necessity cannot be provided at a lesser intensity of care such as a SNF. 4. Patient has experienced substantial functional loss from his/her baseline which was documented above under the "Functional History" and "Functional Status" headings.  Judging by the patient's diagnosis, physical exam, and functional history, the patient has potential for functional progress which will result in measurable gains while on inpatient rehab.  These gains will be of substantial and practical use upon discharge  in facilitating mobility and self-care at the household level. 5. Physiatrist will provide 24 hour management of medical needs as well as oversight of the therapy plan/treatment and provide guidance as appropriate regarding the interaction of the two. 6. The Preadmission Screening has been reviewed and patient status is unchanged unless otherwise stated above. 7. 24 hour rehab nursing will assist with bladder management, bowel management, safety, skin/wound care, disease management, medication administration, pain management and patient education  and help integrate therapy concepts, techniques,education, etc. 8. PT will assess and treat for/with: Lower extremity strength, range of motion, stamina, balance, functional mobility, safety, adaptive techniques and equipment, NMR, visual-spatial rx, family ed.   Goals are: mod I. 9. OT will assess and treat for/with: ADL's, functional mobility, safety, upper  extremity strength, adaptive techniques and equipment, NMR, visual-spatial rx, community reentry.   Goals are: mod I. Therapy may proceed with showering this patient. 10. SLP will assess and treat for/with: swallowing.  Goals are: mod I. 11. Case Management and Social  Worker will assess and treat for psychological issues and discharge planning. 12. Team conference will be held weekly to assess progress toward goals and to determine barriers to discharge. 13. Patient will receive at least 3 hours of therapy per day at least 5 days per week. 14. ELOS: 7-11 days       15. Prognosis:  excellent.   I have personally performed a face to face diagnostic evaluation of this patient and formulated the key components of the plan.  Additionally, I have personally reviewed laboratory data, imaging studies, as well as relevant notes and concur with the physician assistant's documentation above.  Meredith Staggers, MD, [facility]

## 2018-06-05 NOTE — Progress Notes (Addendum)
Inpatient Rehab Admissions:  I met with patient at the bedside and spoke with her son over the phone.  Pt and son continue to be interested in CIR stay and son/wife are prepared to provide 24/7 supervision in the pt's home if needed.  I have MD clearance from Dr. Maylene Roes for admit today.  I have notified pt, son, and CM/CSW.   Signed: Shann Medal, PT, DPT Admissions Coordinator [phone] 06/05/18  11:31 AM

## 2018-06-05 NOTE — Progress Notes (Signed)
Physical Therapy Treatment Patient Details Name: Meredith Gonzalez MRN: [id number] DOB: 1935-08-08 Today's Date: 06/05/2018    History of Present Illness 83 y/o female admitted 05/30/2018 for suspected stroke but has not had MRI yet to confirm. PMH significant for recent stroke, DM, HTN, pacemaker, CHF, cervical fracture, femur fracture, recurrent falls. S/p right CEA.     PT Comments    Patient is progressing very well towards their physical therapy goals. Session focused on bilateral lower extremity strengthening and progression of functional mobility. Pt drowsy throughout session, but still participatory, states she "feels like she's falling forward," when sitting EOB. BP stable throughout. Ambulating 20 feet with walker and min assist with gait abnormalities. Presents as high risk for falls based on balance impairments and decreased gait speed. D/c to CIR.     Follow Up Recommendations  CIR;Supervision for mobility/OOB     Equipment Recommendations  None recommended by PT    Recommendations for Other Services       Precautions / Restrictions Precautions Precautions: Fall(telemetry) Precaution Comments: many falls with Sept neck fracture Restrictions Weight Bearing Restrictions: No    Mobility  Bed Mobility Overal bed mobility: Needs Assistance Bed Mobility: Supine to Sit     Supine to sit: Min assist     General bed mobility comments: min assist for trunk elevation  Transfers Overall transfer level: Needs assistance Equipment used: Rolling walker (2 wheeled) Transfers: Sit to/from Stand Sit to Stand: Min assist         General transfer comment: min assist for lifting and stability  Ambulation/Gait Ambulation/Gait assistance: Min assist Gait Distance (Feet): 20 Feet Assistive device: Rolling walker (2 wheeled) Gait Pattern/deviations: Step-through pattern;Decreased stride length;Wide base of support;Ataxic Gait velocity: decreased Gait velocity interpretation:  <1.8 ft/sec, indicate of risk for recurrent falls General Gait Details: pt with mildly ataxic gait pattern, demonstrating erratic step length/width. cues for pacing and segmental turning   Stairs             Wheelchair Mobility    Modified Rankin (Stroke Patients Only) Modified Rankin (Stroke Patients Only) Pre-Morbid Rankin Score: Slight disability Modified Rankin: Moderately severe disability     Balance Overall balance assessment: Needs assistance;History of Falls Sitting-balance support: Feet supported;Single extremity supported Sitting balance-Leahy Scale: Fair     Standing balance support: Bilateral upper extremity supported;During functional activity Standing balance-Leahy Scale: Poor Standing balance comment: requires RW and cues for correction of initial standing posture                            Cognition Arousal/Alertness: Awake/alert Behavior During Therapy: WFL for tasks assessed/performed Overall Cognitive Status: Within Functional Limits for tasks assessed                                        Exercises General Exercises - Lower Extremity Hip ABduction/ADduction: AROM;Both;10 reps Straight Leg Raises: AROM;Both;10 reps Other Exercises Other Exercises: Supine hip bridging x 10    General Comments        Pertinent Vitals/Pain Pain Assessment: Faces Faces Pain Scale: Hurts little more Pain Location: surgical site Pain Descriptors / Indicators: Aching Pain Intervention(s): Monitored during session    Home Living                      Prior Function  PT Goals (current goals can now be found in the care plan section) Acute Rehab PT Goals Patient Stated Goal: get home after rehab PT Goal Formulation: With patient Potential to Achieve Goals: Good Progress towards PT goals: Progressing toward goals    Frequency    Min 4X/week      PT Plan Current plan remains appropriate     Co-evaluation              AM-PAC PT "6 Clicks" Mobility   Outcome Measure  Help needed turning from your back to your side while in a flat bed without using bedrails?: A Little Help needed moving from lying on your back to sitting on the side of a flat bed without using bedrails?: A Little Help needed moving to and from a bed to a chair (including a wheelchair)?: A Little Help needed standing up from a chair using your arms (e.g., wheelchair or bedside chair)?: A Little Help needed to walk in hospital room?: A Little Help needed climbing 3-5 steps with a railing? : A Lot 6 Click Score: 17    End of Session Equipment Utilized During Treatment: Gait belt Activity Tolerance: Patient tolerated treatment well Patient left: with call bell/phone within reach;in chair;with chair alarm set Nurse Communication: Mobility status PT Visit Diagnosis: Other abnormalities of gait and mobility (R26.89);History of falling (Z91.81) Hemiplegia - Right/Left: Left Hemiplegia - dominant/non-dominant: Non-dominant Hemiplegia - caused by: Cerebral infarction     Time: 8022-3361 PT Time Calculation (min) (ACUTE ONLY): 21 min  Charges:  $Therapeutic Exercise: 8-22 mins          Ellamae Sia, PT, DPT Acute Rehabilitation Services Pager [phone] Office [phone]    Willy Eddy 06/05/2018, 5:24 PM

## 2018-06-05 NOTE — Progress Notes (Signed)
Patient ID: Meredith Gonzalez, female   DOB: 1935/04/09, 83 y.o.   MRN: [id number]  Progress Note    06/05/2018 1:31 PM 2 Days Post-Op  Subjective: Still with neck spine discomfort which is chronic.  Ports some incisional discomfort on the right   Vitals:   06/05/18 0610 06/05/18 0930  BP: 113/63   Pulse: 67   Resp: 12   Temp: 97.7 F (36.5 C)   SpO2: 94% 94%   Physical Exam: Neck without hematoma and healing well.  Neurologic at her baseline  CBC    Component Value Date/Time   WBC 5.8 06/04/2018 0240   RBC 3.05 (L) 06/04/2018 0240   HGB 8.8 (L) 06/04/2018 0240   HGB 10.2 (L) 05/19/2018 1612   HCT 30.3 (L) 06/04/2018 0240   HCT 32.2 (L) 05/19/2018 1612   PLT 222 06/04/2018 0240   PLT 286 05/19/2018 1612   MCV 99.3 06/04/2018 0240   MCV 91 05/19/2018 1612   MCH 28.9 06/04/2018 0240   MCHC 29.0 (L) 06/04/2018 0240   RDW 19.9 (H) 06/04/2018 0240   RDW 16.8 (H) 05/19/2018 1612   LYMPHSABS 0.8 05/30/2018 1413   MONOABS 1.0 05/30/2018 1413   EOSABS 0.1 05/30/2018 1413   BASOSABS 0.0 05/30/2018 1413    BMET    Component Value Date/Time   NA 136 06/04/2018 0240   NA 144 05/19/2018 1612   K 4.2 06/04/2018 0240   CL 107 06/04/2018 0240   CO2 22 06/04/2018 0240   GLUCOSE 215 (H) 06/04/2018 0240   BUN 18 06/04/2018 0240   BUN 15 05/19/2018 1612   CREATININE 0.79 06/04/2018 0240   CALCIUM 7.5 (L) 06/04/2018 0240   GFRNONAA >60 06/04/2018 0240   GFRAA >60 06/04/2018 0240    INR    Component Value Date/Time   INR 1.0 06/03/2018 0412     Intake/Output Summary (Last 24 hours) at 06/05/2018 1331 Last data filed at 06/05/2018 0610 Gross per 24 hour  Intake 480 ml  Output -  Net 480 ml     Assessment/Plan:  83 y.o. female stable postop day 2 from right carotid endarterectomy.  Plan for CIR.  Will not follow actively.  We will see her back in the office in approximately 3 weeks.  Please call if we can assist     Rosetta Posner, MD [facility] [phone] 06/05/2018 1:31 PM

## 2018-06-05 NOTE — PMR Pre-admission (Signed)
PMR Admission Coordinator Pre-Admission Assessment  Patient: Meredith Gonzalez is an 83 y.o., female MRN: [id number] DOB: [birth year] Height: _0  (165.1 cm) Weight: 60.5 kg              Insurance Information HMO: no    PPO:      PCP:      IPA:      80/20:      OTHER:  PRIMARY: Medicare A and B      Policy#: [id number]      Subscriber: patient CM Name:       Phone#:      Fax#:  Pre-Cert#:  Verified via [employer] on 06/04/2018     Employer:  Benefits:  Phone #:      Name:  Eff. Date: A 06/24/1998, B 07/24/1999     Deduct: $1408      Out of Pocket Max: n/a      Life Max: n/a CIR: 100%      SNF: 20 full days Outpatient: 80%     Co-Pay: 20% Home Health:  100%      Co-Pay:  DME: 80%     Co-Pay: 20%  SECONDARY: [employer]      Policy#: [id number]      Subscriber: patient CM Name:       Phone#:      Fax#:  Pre-Cert#:       Employer:  Benefits:  Phone #: [phone]     Name:  Eff. Date:      Deduct:       Out of Pocket Max:       Life Max:  CIR:       SNF:  Outpatient:      Co-Pay:  Home Health:       Co-Pay:  DME:      Co-Pay:   Medicaid Application Date:       Case Manager:  Disability Application Date:       Case Worker:   Emergency Contact Information Contact Information    Name Relation Home Work Mobile   [place] Son   [phone]   [place], [place] Daughter [phone]       Current Medical History  Patient Admitting Diagnosis: R CVA History of Present Illness: Meredith Gonzalez is an 83 year old female with history of CHF, HTN, cervical stenosis with chronic pain, T2DM, A fib s/p PPM, dyspnea likely due to COPD, CVA with recent CIR stay (d/c 05/07/18) 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 CTA head/neck negative for emergent findings and upto 60-65% with no wall abnormality. Carotid dopplers revealed R-40-59% ICA stenosis. Dr. Erlinda Hong felt that stroke was  embolic likely due to A fib as patient with recurrent similar symptoms. Eliquis increased to 5 mg bid.  VVS consulted for input and recommended CEA due to "moderate to severe extremely irregular right bifurcation plaque".  She underwent R-CEA with dacron patch angioplasty by Dr. Donnetta Hutching on 06/03/18 and Eliquis resumed yesterday.    Complete NIHSS TOTAL: 1    Past Medical History  Past Medical History:  Diagnosis Date  . Anemia   . Anxiety   . Arthritis    "all over" (02/11/2018)  . Basal cell carcinoma    "left leg" (02/11/2018)  . Cervical spine fracture ([place]) 12/2017   "C1-2"  . CHF (congestive heart failure) ([place])   . Chronic bronchitis ([place])   .  Chronic neck pain    "since I broke my neck 6-8 wk ago" (02/11/2018)  . Diabetic peripheral neuropathy ([place])   . Fibromyalgia   . Fracture of right humerus   . Generalized weakness   . Headache    "weekly" (02/11/2018)  . History of blood transfusion    "related to one of my femur surgeries" (02/11/2018)  . History of echocardiogram    Echo 8/18: EF 50-55, no RWMA, Gr 1 DD, calcified AV leaflets, MAC, trivial MR, mod LAE, PASP 37  . Hypertension   . Hypothyroidism   . Incontinence of urine   . Ischemic stroke ([place]) 2000   "lost part of the vision in my right eye" (02/11/2018)  . Major depression, chronic   . Myocardial infarction ([place]) 06/2016  . Pacemaker   . Recurrent falls   . Syncope and collapse   . Type 2 diabetes, diet controlled ([place])     Family History  family history includes Alcoholism in her brother, brother, father, and sister; Congestive Heart Failure in her mother; Heart attack in her father; Throat cancer in her brother.  Prior Rehab/Hospitalizations:  Has the patient had major surgery during 100 days prior to admission? Yes, Carotid Enarterectomy on 06/03/2018  Current Medications   Current Facility-Administered Medications:  .  acetaminophen (TYLENOL) tablet 650 mg, 650 mg, Oral, Q4H PRN, 650 mg at  06/05/18 1039 **OR** acetaminophen (TYLENOL) solution 650 mg, 650 mg, Per Tube, Q4H PRN **OR** acetaminophen (TYLENOL) suppository 650 mg, 650 mg, Rectal, Q4H PRN, Early, Arvilla Meres, MD .  albuterol (PROVENTIL) (2.5 MG/3ML) 0.083% nebulizer solution 2.5 mg, 2.5 mg, Nebulization, Q6H PRN, Early, Arvilla Meres, MD .  alum & mag hydroxide-simeth (MAALOX/MYLANTA) 200-200-20 MG/5ML suspension 15-30 mL, 15-30 mL, Oral, Q2H PRN, Early, Arvilla Meres, MD .  amiodarone (PACERONE) tablet 200 mg, 200 mg, Oral, Beaulah Corin, Todd F, MD, 200 mg at 06/05/18 0626 .  apixaban (ELIQUIS) tablet 5 mg, 5 mg, Oral, BID, Kris Mouton, RPH, 5 mg at 06/05/18 1031 .  atorvastatin (LIPITOR) tablet 20 mg, 20 mg, Oral, Daily, Early, Arvilla Meres, MD, 20 mg at 06/05/18 1039 .  calcium-vitamin D (OSCAL WITH D) 500-200 MG-UNIT per tablet 1 tablet, 1 tablet, Oral, BID, Early, Arvilla Meres, MD, 1 tablet at 06/05/18 1031 .  Chlorhexidine Gluconate Cloth 2 % PADS 6 each, 6 each, Topical, Q0600, Early, Arvilla Meres, MD, 6 each at 06/03/18 1100 .  docusate sodium (COLACE) capsule 100 mg, 100 mg, Oral, Daily, Early, Arvilla Meres, MD, 100 mg at 06/05/18 1031 .  ferrous sulfate tablet 325 mg, 325 mg, Oral, BID WC, Early, Arvilla Meres, MD, 325 mg at 06/05/18 0906 .  fluticasone (FLONASE) 50 MCG/ACT nasal spray 1 spray, 1 spray, Each Nare, Daily PRN, Early, Arvilla Meres, MD .  furosemide (LASIX) tablet 10 mg, 10 mg, Oral, Daily, Early, Arvilla Meres, MD, 10 mg at 06/05/18 1032 .  gabapentin (NEURONTIN) capsule 300 mg, 300 mg, Oral, q morning - 10a, Early, Arvilla Meres, MD, 300 mg at 06/05/18 1032 .  gabapentin (NEURONTIN) capsule 600 mg, 600 mg, Oral, QPM, Early, Arvilla Meres, MD, 600 mg at 06/04/18 2203 .  guaiFENesin-dextromethorphan (ROBITUSSIN DM) 100-10 MG/5ML syrup 15 mL, 15 mL, Oral, Q4H PRN, Early, Arvilla Meres, MD .  hydrALAZINE (APRESOLINE) injection 5 mg, 5 mg, Intravenous, Q20 Min PRN, Early, Todd F, MD .  labetalol (NORMODYNE,TRANDATE) injection 10 mg, 10 mg, Intravenous, Q10 min PRN, Early, Arvilla Meres,  MD .  lactated ringers infusion, ,  Intravenous, Continuous, Early, Arvilla Meres, MD .  levothyroxine (SYNTHROID, LEVOTHROID) tablet 50 mcg, 50 mcg, Oral, Q0600, Early, Arvilla Meres, MD, 50 mcg at 06/05/18 4709 .  methocarbamol (ROBAXIN) tablet 250 mg, 250 mg, Oral, Q8H PRN, Early, Arvilla Meres, MD .  metoprolol tartrate (LOPRESSOR) injection 2-5 mg, 2-5 mg, Intravenous, Q2H PRN, Early, Arvilla Meres, MD .  mometasone-formoterol (DULERA) 200-5 MCG/ACT inhaler 2 puff, 2 puff, Inhalation, BID, Early, Arvilla Meres, MD, 2 puff at 06/05/18 0930 .  multivitamin with minerals tablet 1 tablet, 1 tablet, Oral, Daily, Early, Arvilla Meres, MD, 1 tablet at 06/05/18 1032 .  mupirocin ointment (BACTROBAN) 2 % 1 application, 1 application, Nasal, BID, Early, Arvilla Meres, MD, 1 application at 62/83/66 2203 .  nitroGLYCERIN (NITROSTAT) SL tablet 0.4 mg, 0.4 mg, Sublingual, Q5 min PRN, Early, Arvilla Meres, MD .  ondansetron (ZOFRAN) injection 4 mg, 4 mg, Intravenous, Q6H PRN, Early, Arvilla Meres, MD .  pantoprazole (PROTONIX) EC tablet 40 mg, 40 mg, Oral, Daily, Early, Arvilla Meres, MD, 40 mg at 06/05/18 1031 .  phenol (CHLORASEPTIC) mouth spray 1 spray, 1 spray, Mouth/Throat, PRN, Early, Arvilla Meres, MD .  senna-docusate (Senokot-S) tablet 1 tablet, 1 tablet, Oral, QHS PRN, Early, Arvilla Meres, MD .  traMADol (ULTRAM) tablet 50 mg, 50 mg, Oral, Q6H PRN, Early, Arvilla Meres, MD, 50 mg at 06/04/18 0230 .  vitamin B-12 (CYANOCOBALAMIN) tablet 1,000 mcg, 1,000 mcg, Oral, Daily, Early, Arvilla Meres, MD, 1,000 mcg at 06/05/18 1032  Patients Current Diet:  Diet Order            Diet - low sodium heart healthy        Diet Heart Room service appropriate? Yes; Fluid consistency: Thin  Diet effective now              Precautions / Restrictions Precautions Precautions: Fall(telemetry) Precaution Comments: many falls with Sept neck fracture Restrictions Weight Bearing Restrictions: No   Has the patient had 2 or more falls or a fall with injury in the past year?Yes  Prior Activity  Level Limited Community (1-2x/wk): was not driving following previous rehab stay in Feb 2020  [place] / [place] Devices/Equipment: [place]: Environmental consultant - 4 wheels, Environmental consultant - 2 wheels, Bedside commode, Shower seat - built in, [employer] - tub/shower  Prior Device Use: Indicate devices/aids used by the patient prior to current illness, exacerbation or injury? Walker  Prior Functional Level Prior Function Level of Independence: Independent with assistive device(s) Comments: used cane to help with balance till got to shopping cart when grocery shopping; rollator in the home  Self Care: Did the patient need help bathing, dressing, using the toilet or eating?  Independent  Indoor Mobility: Did the patient need assistance with walking from room to room (with or without device)? Independent  Stairs: Did the patient need assistance with internal or external stairs (with or without device)? Independent  Functional Cognition: Did the patient need help planning regular tasks such as shopping or remembering to take medications? Independent  Current Functional Level Cognition  Arousal/Alertness: Awake/alert Overall Cognitive Status: Within Functional Limits for tasks assessed Current Attention Level: Selective Orientation Level: Oriented X4 Safety/Judgement: Decreased awareness of deficits, Decreased awareness of safety General Comments: Increased processing time Attention: Focused, Sustained Focused Attention: Appears intact Sustained Attention: Appears intact Memory: Impaired Memory Impairment: Storage deficit, Retrieval deficit(Immediate: 3/3; Delayed: 2/3 ) Decreased Short Term Memory: Verbal complex Awareness: Appears intact Problem Solving: Appears intact Executive Function: Reasoning(2/2)  Extremity Assessment (includes Sensation/Coordination)  Upper Extremity Assessment: Generalized weakness, LUE deficits/detail LUE Deficits /  Details: Strength 3+/5 in shoulder, elbow WFL. Decreased coordination but functional LUE Sensation: decreased light touch LUE Coordination: decreased fine motor  Lower Extremity Assessment: Defer to PT evaluation LLE Deficits / Details: decreased functional stregth througout LLE    ADLs  Overall ADL's : Needs assistance/impaired Eating/Feeding: Set up, Sitting Grooming: Supervision/safety, Sitting Grooming Details (indicate cue type and reason): min guard for balance when standing, set up assistance when sitting Upper Body Bathing: Supervision/ safety, Sitting Lower Body Bathing: Minimal assistance, Sit to/from stand Lower Body Bathing Details (indicate cue type and reason): pt uses LH sponge at home Upper Body Dressing : Supervision/safety, Sitting Lower Body Dressing: Moderate assistance, Sit to/from stand Lower Body Dressing Details (indicate cue type and reason): pt uses reacher and other AD at home, unable to reach feet Toilet Transfer: Minimal assistance, RW, Regular Toilet Toileting- Clothing Manipulation and Hygiene: Min guard, Sit to/from stand Functional mobility during ADLs: Minimal assistance, Rolling walker General ADL Comments: Pt limited by generalized weakness and balance deficits    Mobility  Overal bed mobility: Needs Assistance Bed Mobility: Supine to Sit, Sit to Supine Supine to sit: Supervision Sit to supine: Supervision General bed mobility comments: up in chair when PT arrived    Transfers  Overall transfer level: Needs assistance Equipment used: Rolling walker (2 wheeled) Transfers: Sit to/from Stand Sit to Stand: Min assist Stand pivot transfers: Min assist General transfer comment: min assist from chair after ex's and reminders every trial for hand placement    Ambulation / Gait / Stairs / Wheelchair Mobility  Ambulation/Gait Ambulation/Gait assistance: Herbalist (Feet): 40 Feet(x 2) Assistive device: Rolling walker (2 wheeled) Gait  Pattern/deviations: Step-through pattern, Decreased stride length, Wide base of support(wide turns) General Gait Details: cautious gait, used RW with cues for setup and to sit,  Gait velocity: decreased    Posture / Balance Dynamic Sitting Balance Sitting balance - Comments: dense cues for postural control to sit up to prep standing Balance Overall balance assessment: Needs assistance, History of Falls Sitting-balance support: Feet supported Sitting balance-Leahy Scale: Fair Sitting balance - Comments: dense cues for postural control to sit up to prep standing Postural control: Posterior lean, Right lateral lean(no LOB, but lean present) Standing balance support: Bilateral upper extremity supported, During functional activity Standing balance-Leahy Scale: Poor Standing balance comment: requires RW and cues for correction of initial standing posture    Special needs/care consideration BiPAP/CPAP no  CPM no Continuous Drip IV no Dialysis no      Life Vest no Oxygen no Special Bed no Trach Size no Wound Vac (area) no  Skin  incision to R neck with glue, ecchymosis to buttock, abrasions to R ankle and back Bowel mgmt: continent, last BM 06/04/2018 Bladder mgmt: continent Diabetic mgmt diet controlled Pacemaker     Previous Home Environment Living Arrangements: Alone  Lives With: Alone Available Help at Discharge: Family, Available PRN/intermittently Type of Home: House Home Layout: One level Home Access: Level entry Bathroom Shower/Tub: Walk-in shower(no threshold) Biochemist, clinical: Standard Bathroom Accessibility: Yes How Accessible: Accessible via walker, Accessible via wheelchair Home Care Services: No Additional Comments: [facility]", however son is making arrangements for her to potentially live in basement MIL suite  Discharge Living Setting Plans for Discharge Living Setting: Patient's home Type of Home at Discharge: House Discharge Home  Layout: One level Discharge Home Access: Level entry Discharge  Bathroom Shower/Tub: Walk-in shower(no threshold) Discharge Bathroom Toilet: Standard Discharge Bathroom Accessibility: Yes How Accessible: Accessible via wheelchair Does the patient have any problems obtaining your medications?: No  Social/Family/Support Systems Patient Roles: Parent Anticipated Caregiver: Pt's son, Lennette Bihari Anticipated Ambulance person Information: [phone] Ability/Limitations of Caregiver: pt's son lives near his mother, and he and his wife are prepared to stay with her 24/7 at d/c if needed Caregiver Availability: 24/7 Discharge Plan Discussed with Primary Caregiver: Yes Is Caregiver In Agreement with Plan?: Yes Does Caregiver/Family have Issues with Lodging/Transportation while Pt is in Rehab?: No   Goals/Additional Needs Patient/Family Goal for Rehab: OT/PT mod I, n/a SLP Expected length of stay: 7-10 days Cultural Considerations: none Dietary Needs: heart healthy, thin Equipment Needs: tbd Pt/Family Agrees to Admission and willing to participate: Yes Program Orientation Provided & Reviewed with Pt/Caregiver Including Roles  & Responsibilities: Yes   Decrease burden of Care through IP rehab admission: No    Possible need for SNF placement upon discharge: not anticipated   Patient Condition: This patient's medical and functional status has changed since the consult dated: 06/01/2018 in which the Rehabilitation Physician determined and documented that the patient's condition is appropriate for intensive rehabilitative care in an inpatient rehabilitation facility. See "History of Present Illness" (above) for medical update. Functional changes are: pt continuing to demonstrate L hemiparesis and requiring min assist for ambulation up to 40' following CEA on 06/03/2018. Patient's medical and functional status update has been discussed with the Rehabilitation physician and patient remains appropriate  for inpatient rehabilitation. Will admit to inpatient rehab today.  Preadmission Screen Completed By:  Michel Santee, 06/05/2018 11:16 AM ______________________________________________________________________   Discussed status with Dr. Naaman Plummer on 06/05/18 at 11:26 AM and received telephone approval for admission today.  Admission Coordinator:  Michel Santee, time 11:26 AM Sudie Grumbling 06/05/18

## 2018-06-05 NOTE — Progress Notes (Signed)
Pt markedly groggy this AM presumably from sleep-aid she took last night. Arousable to voice and able to ambulate to [facility] but slightly more unsteady on feet than yesterday PM. 2/2 grogginess, pt refused ambulation or sitting in chair endorsing she'd like to "sleep off the sleeping pill".

## 2018-06-06 ENCOUNTER — Inpatient Hospital Stay (HOSPITAL_COMMUNITY): Payer: Medicare Other | Admitting: Physical Therapy

## 2018-06-06 ENCOUNTER — Inpatient Hospital Stay (HOSPITAL_COMMUNITY): Payer: Medicare Other | Admitting: Occupational Therapy

## 2018-06-06 DIAGNOSIS — R7989 Other specified abnormal findings of blood chemistry: Secondary | ICD-10-CM

## 2018-06-06 LAB — COMPREHENSIVE METABOLIC PANEL
ALT: 13 U/L (ref 0–44)
AST: 30 U/L (ref 15–41)
Albumin: 2.6 g/dL — ABNORMAL LOW (ref 3.5–5.0)
Alkaline Phosphatase: 77 U/L (ref 38–126)
Anion gap: 9 (ref 5–15)
BUN: 26 mg/dL — ABNORMAL HIGH (ref 8–23)
CO2: 25 mmol/L (ref 22–32)
Calcium: 8.4 mg/dL — ABNORMAL LOW (ref 8.9–10.3)
Chloride: 107 mmol/L (ref 98–111)
Creatinine, Ser: 0.8 mg/dL (ref 0.44–1.00)
GFR calc Af Amer: >60 mL/min (ref >60)
GFR calc non Af Amer: >60 mL/min (ref >60)
GLUCOSE: 99 mg/dL (ref 70–99)
Potassium: 3.8 mmol/L (ref 3.5–5.1)
Sodium: 141 mmol/L (ref 135–145)
Total Bilirubin: 0.5 mg/dL (ref 0.3–1.2)
Total Protein: 5.7 g/dL — ABNORMAL LOW (ref 6.5–8.1)

## 2018-06-06 LAB — CBC WITH DIFFERENTIAL/PLATELET
Abs Immature Granulocytes: 0.04 10*3/uL (ref 0.00–0.07)
Basophils Absolute: 0 10*3/uL (ref 0.0–0.1)
Basophils Relative: 0 %
Eosinophils Absolute: 0 10*3/uL (ref 0.0–0.5)
Eosinophils Relative: 1 %
HCT: 30.2 % — ABNORMAL LOW (ref 36.0–46.0)
Hemoglobin: 8.7 g/dL — ABNORMAL LOW (ref 12.0–15.0)
Immature Granulocytes: 1 %
Lymphocytes Relative: 14 %
Lymphs Abs: 1 10*3/uL (ref 0.7–4.0)
MCH: 29.2 pg (ref 26.0–34.0)
MCHC: 28.8 g/dL — ABNORMAL LOW (ref 30.0–36.0)
MCV: 101.3 fL — ABNORMAL HIGH (ref 80.0–100.0)
Monocytes Absolute: 0.7 10*3/uL (ref 0.1–1.0)
Monocytes Relative: 10 %
Neutro Abs: 5.3 10*3/uL (ref 1.7–7.7)
Neutrophils Relative %: 74 %
Platelets: 216 10*3/uL (ref 150–400)
RBC: 2.98 MIL/uL — AB (ref 3.87–5.11)
RDW: 20.7 % — AB (ref 11.5–15.5)
WBC: 7.1 10*3/uL (ref 4.0–10.5)
nRBC: 0 % (ref 0.0–0.2)

## 2018-06-06 LAB — GLUCOSE, CAPILLARY
Glucose-Capillary: 113 mg/dL — ABNORMAL HIGH (ref 70–99)
Glucose-Capillary: 86 mg/dL (ref 70–99)
Glucose-Capillary: 88 mg/dL (ref 70–99)
Glucose-Capillary: 116 mg/dL — ABNORMAL HIGH (ref 70–99)

## 2018-06-06 NOTE — Progress Notes (Signed)
Physical Therapy Session Note  Patient Details  Name: Meredith Gonzalez MRN: [id number] Date of Birth: 06-26-1935  Today's Date: 06/06/2018 PT Individual Time: 1400-1510 PT Individual Time Calculation (min): 70 min   Short Term Goals: Week 1:  PT Short Term Goal 1 (Week 1): Pt will perform transfers with supervision assist and LRAD  PT Short Term Goal 2 (Week 1): Pt will ambulate 114f with supervision assist and and LRAD  PT Short Term Goal 3 (Week 1): Pt will maintain standing x 3 minutes with supervsion assist from PT   Skilled Therapeutic Interventions/Progress Updates:   Pt received sitting in recliner and agreeable to PT. [facility].  [facility] demonstrates increased fall risk as noted by score of  24 /56 on Berg Balance Scale.  (<36= high risk for falls, close to 100%; 37-45 significant >80%; 46-51 moderate >50%; 52-55 lower >25%). Gait training with RW. X 152fwith supervision assist - CGA. Mild dysmetria in the LLE and min cues for proper posture and positioning in RW.   Nustep reciprocal movement training x 5 min + 3 min with prolonged rest break, cues for full ROM and symmetry of force R vs L. Pt reports increasing pain in neck throughout treatment session. RN made aware. Pt returned to room and performed stand pivot transfer to bed with supervision. Sit>supine completed with supervision assist, and left supine in bed with call bell in reach and all needs met.         Therapy Documentation Precautions:  Precautions Precautions: Fall Precaution Comments: many falls with Sept neck fracture Restrictions Weight Bearing Restrictions: No Vital Signs: Therapy Vitals Temp: 98.1 F (36.7 C) Temp Source: Oral Pulse Rate: 73 Resp: 16 BP: 127/61 Patient Position (if appropriate): Lying Oxygen Therapy SpO2: 100 % O2 Device: Room Air Pain: Pain Assessment Pain Scale: 0-10 Pain Score: 8  Pain Type: Acute  pain Pain Location: Neck Pain Descriptors / Indicators: Aching Pain Frequency: Occasional Pain Onset: On-going Pain Intervention(s): Medication (See eMAR) :    Balance: Standardized Balance Assessment Standardized Balance Assessment: Berg Balance Test Berg Balance Test Sit to Stand: Able to stand  independently using hands Standing Unsupported: Able to stand 2 minutes with supervision Sitting with Back Unsupported but Feet Supported on Floor or Stool: Able to sit 2 minutes under supervision Stand to Sit: Controls descent by using hands Transfers: Needs one person to assist Standing Unsupported with Eyes Closed: Able to stand 10 seconds with supervision Standing Ubsupported with Feet Together: Needs help to attain position and unable to hold for 15 seconds From Standing, Reach Forward with Outstretched Arm: Can reach forward >5 cm safely (2") From Standing Position, Pick up Object from Floor: Unable to pick up and needs supervision From Standing Position, Turn to Look Behind Over each Shoulder: Turn sideways only but maintains balance Turn 360 Degrees: Needs assistance while turning Standing Unsupported, Alternately Place Feet on Step/Stool: Able to complete >2 steps/needs minimal assist Standing Unsupported, One Foot in Front: Able to take small step independently and hold 30 seconds Standing on One Leg: Unable to try or needs assist to prevent fall Total Score: 24    Therapy/Group: Individual Therapy  AuLorie Phenix/14/2020, 3:48 PM

## 2018-06-06 NOTE — Evaluation (Signed)
Physical Therapy Assessment and Plan  Patient Details  Name: Meredith Gonzalez MRN: [id number] Date of Birth: [birth year]  PT Diagnosis: Abnormal posture, Abnormality of gait, Ataxia, Ataxic gait, Hemiparesis non-dominant and Muscle weakness Rehab Potential: Good ELOS: 11-14 days    Today's Date: 06/06/2018 PT Individual Time: 0800-0900 PT Individual Time Calculation (min): 60 min    Problem List:  Patient Active Problem List   Diagnosis Date Noted  . Stroke due to embolism of carotid artery ([place]) 06/05/2018  . Stroke-like symptom   . Dyspnea   . Acute blood loss anemia   . Acute ischemic stroke ([place]) 05/31/2018  . Stroke ([place]) 05/30/2018  . Chronic diastolic congestive heart failure ([place])   . Anemia of chronic disease   . Hypoalbuminemia due to protein-calorie malnutrition ([place])   . Labile blood pressure   . Atrial fibrillation ([place])   . Chronic neck pain   . Subcortical infarction ([place]) 04/27/2018  . CVA (cerebral vascular accident) ([place]) 04/26/2018  . TIA (transient ischemic attack) 04/24/2018  . CAD (coronary artery disease) 08/07/2016  . (HFpEF) heart failure with preserved ejection fraction ([place]) 08/07/2016  . Ischemic cardiomyopathy 08/07/2016  . Chest pain   . NSTEMI (non-ST elevated myocardial infarction) ([place])   . Status post coronary artery stent placement   . Abnormal stress test   . SOB (shortness of breath)   . Multifocal pneumonia 07/13/2016  . Elevated troponin 07/13/2016  . Leukocytosis   . Pacemaker 05/17/2014  . Complications, pacemaker cardiac, mechanical 12/31/2012  . Edema extremities 10/08/2012  . Persistent atrial fibrillation 10/08/2012  . Hypertension 10/08/2012  . Hypothyroidism 10/08/2012  . Neuropathy 10/08/2012  . Diabetes ([place]) 10/08/2012  . Depression 09/08/2012  . Anemia 08/08/2012  . Knee fracture, left 08/07/2012  . Recurrent falls 08/07/2012  . Syncope 08/07/2012  . SDH (subdural hematoma) ([place]) 08/07/2012    Past Medical  History:  Past Medical History:  Diagnosis Date  . Anemia   . Anxiety   . Arthritis    "all over" (02/11/2018)  . Basal cell carcinoma    "left leg" (02/11/2018)  . Cervical spine fracture ([place]) 12/2017   "C1-2"  . CHF (congestive heart failure) ([place])   . Chronic bronchitis ([place])   . Chronic neck pain    "since I broke my neck 6-8 wk ago" (02/11/2018)  . Diabetic peripheral neuropathy ([place])   . Fibromyalgia   . Fracture of right humerus   . Generalized weakness   . Headache    "weekly" (02/11/2018)  . History of blood transfusion    "related to one of my femur surgeries" (02/11/2018)  . History of echocardiogram    Echo 8/18: EF 50-55, no RWMA, Gr 1 DD, calcified AV leaflets, MAC, trivial MR, mod LAE, PASP 37  . Hypertension   . Hypothyroidism   . Incontinence of urine   . Ischemic stroke ([place]) 2000   "lost part of the vision in my right eye" (02/11/2018)  . Major depression, chronic   . Myocardial infarction ([place]) 06/2016  . Pacemaker   . Recurrent falls   . Syncope and collapse   . Type 2 diabetes, diet controlled ([place])    Past Surgical History:  Past Surgical History:  Procedure Laterality Date  . ABDOMINAL HYSTERECTOMY    . ANKLE FRACTURE SURGERY Right   . BASAL CELL CARCINOMA EXCISION Left    "leg"  . CARDIAC CATHETERIZATION  ~ 2014  . CARDIOVERSION N/A 02/13/2018   Procedure: CARDIOVERSION;  Surgeon: Fransico Him  R, MD;  Location: [place];  Service: Cardiovascular;  Laterality: N/A;  . CARDIOVERSION N/A 03/27/2018   Procedure: CARDIOVERSION;  Surgeon: Lelon Perla, MD;  Location: [facility] ENDOSCOPY;  Service: Cardiovascular;  Laterality: N/A;  . CARPAL TUNNEL RELEASE     bilateral  . CATARACT EXTRACTION W/ INTRAOCULAR LENS  IMPLANT, BILATERAL Bilateral   . CHOLECYSTECTOMY OPEN    . COLONOSCOPY    . CORONARY ANGIOPLASTY WITH STENT PLACEMENT  06/2016  . CORONARY STENT INTERVENTION N/A 07/19/2016   Procedure: Coronary Stent Intervention;  Surgeon: Peter M  Martinique, MD;  Location: [place] CV LAB;  Service: Cardiovascular;  Laterality: N/A;  . ENDARTERECTOMY Right 06/03/2018   Procedure: ENDARTERECTOMY CAROTID;  Surgeon: Rosetta Posner, MD;  Location: [place];  Service: Vascular;  Laterality: Right;  . EYE SURGERY     right eye catarace/lens implant  . FEMUR FRACTURE SURGERY Bilateral   . FRACTURE SURGERY    . GASTRIC BYPASS    . INSERT / REPLACE / REMOVE PACEMAKER  ~ 2012  . LEAD REVISION N/A 01/01/2013   Procedure: LEAD REVISION;  Surgeon: Evans Lance, MD;  Location: [facility] CATH LAB;  Service: Cardiovascular;  Laterality: N/A;  . LEFT HEART CATH AND CORONARY ANGIOGRAPHY N/A 07/19/2016   Procedure: Left Heart Cath and Coronary Angiography;  Surgeon: Peter M Martinique, MD;  Location: [place] CV LAB;  Service: Cardiovascular;  Laterality: N/A;  . OVARIAN CYST SURGERY     "one side only"  . PATCH ANGIOPLASTY Right 06/03/2018   Procedure: PATCH ANGIOPLASTY USING HEMASHIELD PLATINUM FINESSE;  Surgeon: Rosetta Posner, MD;  Location: [place];  Service: Vascular;  Laterality: Right;  . TEE WITHOUT CARDIOVERSION N/A 02/13/2018   Procedure: TRANSESOPHAGEAL ECHOCARDIOGRAM (TEE);  Surgeon: Sueanne Margarita, MD;  Location: [facility] ENDOSCOPY;  Service: Cardiovascular;  Laterality: N/A;  . TONSILLECTOMY      Assessment & Plan Clinical Impression: Patient is a86 year old female with history of CHF, HTN, cervical stenosis with chronic pain, T2DM, A fib s/p PPM, dyspnea likely due to COPD, CVA with recent CIR stay (d/c 05/07/18) 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 CTA head/neck negative for emergent findings and upto 60-65% with no wall abnormality. Carotid dopplers revealed R-40-59% ICA stenosis. Dr. Erlinda Hong felt that stroke was embolic likely due to A fib as patient with recurrent similar symptoms. Eliquis increased to 5 mg bid.  VVS consulted for input and  recommended CEA due to "moderate to severe extremely irregular right bifurcation plaque". She underwent R-CEA with dacron patch angioplasty by Dr. Donnetta Hutching on 06/03/18 and Eliquis resumed yesterday. Post op reporting issues with dysphagia. She continues to be limited by dizziness, ataxia with balance deficits and left sided weakness.  Patient transferred to CIR on 06/05/2018 .   Patient currently requires min with mobility secondary to muscle weakness, ataxia and decreased coordination and decreased sitting balance, decreased standing balance, decreased postural control, hemiplegia and decreased balance strategies.  Prior to hospitalization, patient was modified independent  with mobility and lived with Alone in a House home.  Home access is  Level entry.  Patient will benefit from skilled PT intervention to maximize safe functional mobility, minimize fall risk and decrease caregiver burden for planned discharge home with intermittent assist.  Anticipate patient will benefit from follow up [facility] at discharge.  PT - End of Session Activity Tolerance: Tolerates 10 - 20  min activity with multiple rests Endurance Deficit: Yes PT Assessment Rehab Potential (ACUTE/IP ONLY): Good PT Barriers to Discharge: Decreased caregiver support;Medical stability PT Patient demonstrates impairments in the following area(s): Balance;Endurance;Motor;Pain;Safety;Skin Integrity PT Transfers Functional Problem(s): Bed Mobility;Bed to Chair;Car;Furniture;Floor PT Locomotion Functional Problem(s): Stairs;Wheelchair Mobility;Ambulation PT Plan PT Intensity: Minimum of 1-2 x/day ,45 to 90 minutes PT Duration Estimated Length of Stay: 11-14 days  PT Treatment/Interventions: Ambulation/gait training;Balance/vestibular training;Discharge planning;DME/adaptive equipment instruction;Pain management;Psychosocial support;Splinting/orthotics;Therapeutic Activities;UE/LE Strength taining/ROM;Visual/perceptual  remediation/compensation;Wheelchair propulsion/positioning;UE/LE Coordination activities;Therapeutic Exercise;Stair training;Skin care/wound management;Patient/family education;Neuromuscular re-education;Functional electrical stimulation;Disease management/prevention;Community reintegration;Functional mobility training PT Transfers Anticipated Outcome(s): Mod I with LRAD  PT Locomotion Anticipated Outcome(s): Ambulatory Mod I with LRAD at household level  PT Recommendation Recommendations for Other Services: Therapeutic Recreation consult Therapeutic Recreation Interventions: Outing/community reintergration Follow Up Recommendations: Home health PT Patient destination: Home  Skilled Therapeutic Intervention Pt received supine in bed and agreeable to PT. Supine>sit transfer with supervision assist and min cues for safety. PT instructed patient in PT Evaluation and initiated treatment intervention; see below for results. PT educated patient in [place], rehab potential, rehab goals, and discharge recommendations. Pt requires min assist for gait training over level surface as listed below and mod assist for unlevel surface x 2f with RW;  ataxia noted in the LLE during all gait training. Basic and car transfers completed with min assist with and without AD. [facility] returned to [facility] and all needs met.        PT Evaluation Precautions/Restrictions   fall General   Vital SignsTherapy Vitals Temp: 98 F (36.7 C) Temp Source: Oral Pulse Rate: 67 Resp: 16 BP: (!) 117/53 Patient Position (if appropriate): Lying Oxygen Therapy SpO2: 95 % O2 Device: Room Air Pain   2/10 neck ( see MAR) Home Living/Prior Functioning Home Living Available Help at Discharge: Family;Available PRN/intermittently Type of Home: House Home Access: Level entry Home Layout: One level Bathroom Shower/Tub: Walk-in shower(no threshold) BBiochemist, clinical Standard Bathroom  Accessibility: Yes Additional Comments: [facility]",   Lives With: Alone Prior Function Level of Independence: Requires assistive device for independence  Able to Take Stairs?: Yes Comments: since recent hospitalization rollator and RW in the home Vision/Perception  Perception Perception: Within Functional Limits Praxis Praxis: Intact  Cognition Overall Cognitive Status: Within Functional Limits for tasks assessed Orientation Level: Oriented X4 Sensation Sensation Light Touch: Appears Intact Proprioception: Impaired by gross assessment Coordination Gross Motor Movements are Fluid and Coordinated: No Fine Motor Movements are Fluid and Coordinated: No Coordination and Movement Description: LUE and LLE dysmetria  Finger Nose Finger Test: L decreased speed and accurancy, dysmetia Heel Shin Test: L decreased speed and accurancy, dysmetia Motor  Motor Motor: Hemiplegia;Ataxia Motor - Skilled Clinical Observations: LUE and LLE ataxia/ dysmetria   Mobility Bed Mobility Bed Mobility: Rolling Right;Rolling Left;Supine to Sit;Sit to Supine Rolling Right: Supervision/verbal cueing Rolling Left: Supervision/Verbal cueing Supine to Sit: Supervision/Verbal cueing Sit to Supine: Supervision/Verbal cueing Transfers Transfers: Stand to Sit;Stand Pivot Transfers Sit to Stand: Minimal Assistance - Patient > 75% Stand to Sit: Minimal Assistance - Patient > 75% Stand Pivot Transfers: Minimal Assistance - Patient > 75% Transfer (Assistive device): Rolling walker Locomotion  Gait Ambulation: Yes Gait Assistance: Minimal Assistance - Patient > 75% Gait Distance (Feet): 120 Feet Assistive device: Rolling walker Gait Gait: Yes Gait Pattern: Impaired Gait Pattern: Ataxic(on the L ) Stairs / Additional Locomotion Stairs: Yes Stairs Assistance: Moderate Assistance - Patient 50 -  74% Stair Management Technique: Two rails Number of Stairs: 4 Height of Stairs:  6 Wheelchair Mobility Wheelchair Mobility: Yes Wheelchair Assistance: Chartered loss adjuster: Both upper extremities Wheelchair Parts Management: Needs assistance Distance: 159f  Trunk/Postural Assessment  Cervical Assessment Cervical Assessment: Exceptions to [facility] CenterCervical AROM Overall Cervical AROM Comments: limited AROM due to recent cervical fracture 11/2017 Thoracic Assessment Thoracic Assessment: Exceptions to WFL(kyphosis) Lumbar Assessment Lumbar Assessment: Exceptions to WFL(posterior pelvic tilt) Postural Control Postural Control: Deficits on evaluation(absent anterior/posterior hip and ankle strategies, pt with frequent anterior LOB) Righting Reactions: delayed Protective Responses: delayed  Balance Static Sitting Balance Static Sitting - Level of Assistance: 6: Modified independent (Device/Increase time) Dynamic Sitting Balance Dynamic Sitting - Level of Assistance: 5: Stand by assistance Static Standing Balance Static Standing - Level of Assistance: 4: Min assist Dynamic Standing Balance Dynamic Standing - Level of Assistance: 3: Mod assist Extremity Assessment      RLE Assessment RLE Assessment: Within Functional Limits LLE Assessment LLE Assessment: Exceptions to [facility] ClubGeneral Strength Comments:  hip flexion 4/5. knee flexion and extension 4/5. hip abduction and adduction 4/5.     Refer to Care Plan for Long Term Goals  Recommendations for other services: Therapeutic Recreation  Outing/community reintegration  Discharge Criteria: Patient will be discharged from PT if patient refuses treatment 3 consecutive times without medical reason, if treatment goals not met, if there is a change in medical status, if patient makes no progress towards goals or if patient is discharged from hospital.  The above assessment, treatment plan, treatment alternatives and goals were discussed and mutually agreed upon: by patient  ALorie Phenix3/14/2020, 9:03 AM

## 2018-06-06 NOTE — Progress Notes (Signed)
[place] PHYSICAL MEDICINE & REHABILITATION PROGRESS NOTE   Subjective/Complaints: Feeling much better today (didn't receive tramadol). Ready for therapy.  ROS: Patient denies fever, rash, sore throat, blurred vision, nausea, vomiting, diarrhea, cough, shortness of breath or chest pain, joint or back pain, headache, or mood change.    Objective:   No results found. Recent Labs    06/04/18 0240 06/06/18 0638  WBC 5.8 7.1  HGB 8.8* 8.7*  HCT 30.3* 30.2*  PLT 222 216   Recent Labs    06/04/18 0240 06/06/18 0638  NA 136 141  K 4.2 3.8  CL 107 107  CO2 22 25  GLUCOSE 215* 99  BUN 18 26*  CREATININE 0.79 0.80  CALCIUM 7.5* 8.4*    Intake/Output Summary (Last 24 hours) at 06/06/2018 1116 Last data filed at 06/06/2018 4650 Gross per 24 hour  Intake 180 ml  Output -  Net 180 ml     Physical Exam: Vital Signs Blood pressure (!) 117/53, pulse 67, temperature 98 F (36.7 C), temperature source Oral, resp. rate 16, height 5\' 5"  (1.651 m), weight 59.9 kg, SpO2 95 %. Constitutional: No distress . Vital signs reviewed. HEENT: EOMI, oral membranes moist Neck: supple Cardiovascular: RRR without murmur. No JVD    Respiratory: CTA Bilaterally without wheezes or rales. Normal effort    GI: BS +, non-tender, non-distended  Musculoskeletal:Normal range of motion.  Neurological: She isalertand oriented to person, place, and time. Sl ataxic speech.   LUE 3/5 prox to 4/5 distally with impaired FMC. LLE grossly 4/5. RUE and RLE 5/5. No focal sensory findings are appreciated. DTR's 3+ LUE and LLE and 2+ RUE/RLE. Mild left inattention, apraxiaongoing  Skin: Skin iswarmand dry. She isnot diaphoretic.  Psychiatric: pleasant and cooperative     Assessment/Plan: 1. Functional deficits secondary to embolic right cortical infarct which require 3+ hours per day of interdisciplinary therapy in a comprehensive inpatient rehab setting.  Physiatrist is providing close team  supervision and 24 hour management of active medical problems listed below.  Physiatrist and rehab team continue to assess barriers to discharge/monitor patient progress toward functional and medical goals  Care Tool:  Bathing              Bathing assist       Upper Body Dressing/Undressing Upper body dressing        Upper body assist      Lower Body Dressing/Undressing Lower body dressing            Lower body assist       Toileting Toileting    Toileting assist Assist for toileting: Contact Guard/Touching assist     Transfers Chair/bed transfer  Transfers assist     Chair/bed transfer assist level: Minimal Assistance - Patient > 75%     Locomotion Ambulation   Ambulation assist      Assist level: Minimal Assistance - Patient > 75% Assistive device: Walker-rolling Max distance: 120   Walk 10 feet activity   Assist     Assist level: Minimal Assistance - Patient > 75% Assistive device: Walker-rolling   Walk 50 feet activity   Assist    Assist level: Minimal Assistance - Patient > 75% Assistive device: Walker-rolling    Walk 150 feet activity   Assist Walk 150 feet activity did not occur: Safety/medical concerns         Walk 10 feet on uneven surface  activity   Assist     Assist level: Moderate Assistance - Patient -  50 - 74% Assistive device: Aeronautical engineer   Type of Wheelchair: Agricultural engineer assist level: Supervision/Verbal cueing Max wheelchair distance: 170ft    Wheelchair 50 feet with 2 turns activity    Assist        Assist Level: Supervision/Verbal cueing   Wheelchair 150 feet activity     Assist     Assist Level: Supervision/Verbal cueing    Medical Problem List and Plan: 1.Functional and mobility deficitssecondary to embolic right cortical infarct -beginning therapies today 2.  Antithrombotics: -DVT/anticoagulation:Pharmaceutical:Other (comment)--Eliquis -antiplatelet therapy: N/A 3.Chronic neck pain/Pain Management:On gabapentin tid with robaxin prn -no tramadol d/t intolerance  -add kpad prn 4. Mood:LCSW to follow for evaluation and support. -antipsychotic agents: N/A 5. Neuropsych: This patientiscapable of making decisions on herown behalf. 6. Skin/Wound Care:Routine pressure relief measures. 7. Fluids/Electrolytes/Nutrition:Monitor I/O.   I personally reviewed the patient's labs today.   -BUN trending up---encourage fluids, hold lasix  -recheck Monday  8. HTN: Monitor BP bid-on low dose lasix (hold see #7). 9. T2DMwith peripheral neuropathy:Hgb A1c- 5.7. Diet controlled.  -controlled 3/14 10 A fib s/p FWY:[id number] HR bid--continue amiodarone and Eliquis 11. DOE/Likely COPD: Recently started on Dulera.Resume albuterol prn 12.R-CEA s/p CEA 3/11:  13. Anemia: On iron for supplement    -hgb with further drop to 8.7  -no gross blood loss  -recheck next week  LOS: 1 days A FACE TO FACE EVALUATION WAS PERFORMED  Meredith Staggers 06/06/2018, 11:16 AM

## 2018-06-06 NOTE — Evaluation (Signed)
Occupational Therapy Assessment and Plan  Patient Details  Name: Meredith Gonzalez MRN: [id number] Date of Birth: [birth year]  OT Diagnosis: apraxia, ataxia, hemiplegia affecting non-dominant side and muscle weakness (generalized) Rehab Potential: Rehab Potential (ACUTE ONLY): Good ELOS: 10-12 days   Today's Date: 06/06/2018 OT Individual Time: 1000-1100 OT Individual Time Calculation (min): 60 min     Problem List:  Patient Active Problem List   Diagnosis Date Noted  . Stroke due to embolism of carotid artery ([place]) 06/05/2018  . Stroke-like symptom   . Dyspnea   . Acute blood loss anemia   . Acute ischemic stroke ([place]) 05/31/2018  . Stroke ([place]) 05/30/2018  . Chronic diastolic congestive heart failure ([place])   . Anemia of chronic disease   . Hypoalbuminemia due to protein-calorie malnutrition ([place])   . Labile blood pressure   . Atrial fibrillation ([place])   . Chronic neck pain   . Subcortical infarction ([place]) 04/27/2018  . CVA (cerebral vascular accident) ([place]) 04/26/2018  . TIA (transient ischemic attack) 04/24/2018  . CAD (coronary artery disease) 08/07/2016  . (HFpEF) heart failure with preserved ejection fraction ([place]) 08/07/2016  . Ischemic cardiomyopathy 08/07/2016  . Chest pain   . NSTEMI (non-ST elevated myocardial infarction) ([place])   . Status post coronary artery stent placement   . Abnormal stress test   . SOB (shortness of breath)   . Multifocal pneumonia 07/13/2016  . Elevated troponin 07/13/2016  . Leukocytosis   . Pacemaker 05/17/2014  . Complications, pacemaker cardiac, mechanical 12/31/2012  . Edema extremities 10/08/2012  . Persistent atrial fibrillation 10/08/2012  . Hypertension 10/08/2012  . Hypothyroidism 10/08/2012  . Neuropathy 10/08/2012  . Diabetes ([place]) 10/08/2012  . Depression 09/08/2012  . Anemia 08/08/2012  . Knee fracture, left 08/07/2012  . Recurrent falls 08/07/2012  . Syncope 08/07/2012  . SDH (subdural hematoma) ([place]) 08/07/2012     Past Medical History:  Past Medical History:  Diagnosis Date  . Anemia   . Anxiety   . Arthritis    "all over" (02/11/2018)  . Basal cell carcinoma    "left leg" (02/11/2018)  . Cervical spine fracture ([place]) 12/2017   "C1-2"  . CHF (congestive heart failure) ([place])   . Chronic bronchitis ([place])   . Chronic neck pain    "since I broke my neck 6-8 wk ago" (02/11/2018)  . Diabetic peripheral neuropathy ([place])   . Fibromyalgia   . Fracture of right humerus   . Generalized weakness   . Headache    "weekly" (02/11/2018)  . History of blood transfusion    "related to one of my femur surgeries" (02/11/2018)  . History of echocardiogram    Echo 8/18: EF 50-55, no RWMA, Gr 1 DD, calcified AV leaflets, MAC, trivial MR, mod LAE, PASP 37  . Hypertension   . Hypothyroidism   . Incontinence of urine   . Ischemic stroke ([place]) 2000   "lost part of the vision in my right eye" (02/11/2018)  . Major depression, chronic   . Myocardial infarction ([place]) 06/2016  . Pacemaker   . Recurrent falls   . Syncope and collapse   . Type 2 diabetes, diet controlled ([employer])    Past Surgical History:  Past Surgical History:  Procedure Laterality Date  . ABDOMINAL HYSTERECTOMY    . ANKLE FRACTURE SURGERY Right   . BASAL CELL CARCINOMA EXCISION Left    "leg"  . CARDIAC CATHETERIZATION  ~ 2014  . CARDIOVERSION N/A 02/13/2018   Procedure: CARDIOVERSION;  Surgeon: Radford Pax,  Eber Hong, MD;  Location: [facility] ENDOSCOPY;  Service: Cardiovascular;  Laterality: N/A;  . CARDIOVERSION N/A 03/27/2018   Procedure: CARDIOVERSION;  Surgeon: Lelon Perla, MD;  Location: [facility] ENDOSCOPY;  Service: Cardiovascular;  Laterality: N/A;  . CARPAL TUNNEL RELEASE     bilateral  . CATARACT EXTRACTION W/ INTRAOCULAR LENS  IMPLANT, BILATERAL Bilateral   . CHOLECYSTECTOMY OPEN    . COLONOSCOPY    . CORONARY ANGIOPLASTY WITH STENT PLACEMENT  06/2016  . CORONARY STENT INTERVENTION N/A 07/19/2016   Procedure: Coronary Stent Intervention;   Surgeon: Peter M Martinique, MD;  Location: [place] CV LAB;  Service: Cardiovascular;  Laterality: N/A;  . ENDARTERECTOMY Right 06/03/2018   Procedure: ENDARTERECTOMY CAROTID;  Surgeon: Rosetta Posner, MD;  Location: [place];  Service: Vascular;  Laterality: Right;  . EYE SURGERY     right eye catarace/lens implant  . FEMUR FRACTURE SURGERY Bilateral   . FRACTURE SURGERY    . GASTRIC BYPASS    . INSERT / REPLACE / REMOVE PACEMAKER  ~ 2012  . LEAD REVISION N/A 01/01/2013   Procedure: LEAD REVISION;  Surgeon: Evans Lance, MD;  Location: [facility] CATH LAB;  Service: Cardiovascular;  Laterality: N/A;  . LEFT HEART CATH AND CORONARY ANGIOGRAPHY N/A 07/19/2016   Procedure: Left Heart Cath and Coronary Angiography;  Surgeon: Peter M Martinique, MD;  Location: [place] CV LAB;  Service: Cardiovascular;  Laterality: N/A;  . OVARIAN CYST SURGERY     "one side only"  . PATCH ANGIOPLASTY Right 06/03/2018   Procedure: PATCH ANGIOPLASTY USING HEMASHIELD PLATINUM FINESSE;  Surgeon: Rosetta Posner, MD;  Location: [place];  Service: Vascular;  Laterality: Right;  . TEE WITHOUT CARDIOVERSION N/A 02/13/2018   Procedure: TRANSESOPHAGEAL ECHOCARDIOGRAM (TEE);  Surgeon: Sueanne Margarita, MD;  Location[facility] ENDOSCOPY;  Service: Cardiovascular;  Laterality: N/A;  . TONSILLECTOMY      Assessment & Plan Clinical Impression: Patient is a 83 y.o. right handed female with history of CHF, HTN, cervical stenosis with chronic pain, T2DM, A fib s/p PPM, dyspnea likely due to COPD, CVA with recent CIR stay (d/c 05/07/18) 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 CTA head/neck negative for emergent findings and upto 60-65% with no wall abnormality. Carotid dopplers revealed R-40-59% ICA stenosis. Dr. Erlinda Hong felt that stroke was embolic likely due to A fib as patient with recurrent similar symptoms. Eliquis increased to 5 mg  bid.  VVS consulted for input and recommended CEA due to "moderate to severe extremely irregular right bifurcation plaque". She underwent R-CEA with dacron patch angioplasty by Dr. Donnetta Hutching on 06/03/18 and Eliquis resumed yesterday. Post op reporting issues with dysphagia. She continues to be limited by dizziness, ataxia with balance deficits and left sided weakness.Therapy ongoing and patient continues to be limited by.  Patient transferred to CIR on 06/05/2018 .    Patient currently requires min with basic self-care skills secondary to muscle weakness, decreased cardiorespiratoy endurance, motor apraxia, ataxia and decreased coordination, decreased attention to left and decreased standing balance and decreased balance strategies.  Prior to hospitalization, patient could complete ADLs with modified independent .  Patient will benefit from skilled intervention to increase independence with basic self-care skills prior to discharge home independently.  Anticipate patient will require intermittent supervision and follow up home health.  OT - End of Session Activity Tolerance: Tolerates 30+ min activity with multiple rests Endurance Deficit:  Yes Endurance Deficit Description: fatigued after prolonged time on toilet OT Assessment Rehab Potential (ACUTE ONLY): Good OT Barriers to Discharge: Decreased caregiver support OT Patient demonstrates impairments in the following area(s): Balance;Endurance;Motor;Pain;Safety;Sensory;Skin Integrity;Vision OT Basic ADL's Functional Problem(s): Eating;Grooming;Bathing;Dressing;Toileting OT Advanced ADL's Functional Problem(s): Simple Meal Preparation;Light Housekeeping OT Transfers Functional Problem(s): Toilet;Tub/Shower OT Additional Impairment(s): Fuctional Use of Upper Extremity OT Plan OT Intensity: Minimum of 1-2 x/day, 45 to 90 minutes OT Frequency: 5 out of 7 days OT Duration/Estimated Length of Stay: 10-12 days OT Treatment/Interventions:  Balance/vestibular training;Cognitive remediation/compensation;Community reintegration;Discharge planning;Disease mangement/prevention;DME/adaptive equipment instruction;Functional mobility training;Neuromuscular re-education;Pain management;Patient/family education;Psychosocial support;Self Care/advanced ADL retraining;Therapeutic Activities;Therapeutic Exercise;UE/LE Strength taining/ROM;UE/LE Coordination activities;Visual/perceptual remediation/compensation OT Self Feeding Anticipated Outcome(s): Mod I OT Basic Self-Care Anticipated Outcome(s): Mod I OT Toileting Anticipated Outcome(s): Mod I OT Bathroom Transfers Anticipated Outcome(s): Mod I OT Recommendation Patient destination: Home Follow Up Recommendations: Home health OT Equipment Recommended: None recommended by OT   Skilled Therapeutic Intervention OT eval completed with discussion of rehab process, OT purpose, POC, ELOS, and goals.  ADL assessment limited due to pt need to have BM.  Pt completed stand pivot transfers to [facility] with min assist.  Pt required increased time to have successful BM (notified RN of hard, dark stools and blood with hygiene).  Pt completed toileting hygiene and clothing management with CGA for standing balance.  Due to time constraints, unable to complete bathing and dressing this session.  Pt reports desire is to return to her home as independently at possible upon d/c.  OT Evaluation Precautions/Restrictions  Precautions Precautions: Fall Precaution Comments: many falls with Sept neck fracture Pain Pain Assessment Pain Scale: 0-10 Pain Score: 0-No pain Home Living/Prior Functioning Home Living Family/patient expects to be discharged to:: Private residence Living Arrangements: Alone Available Help at Discharge: Family, Available PRN/intermittently Type of Home: House Home Access: Level entry Home Layout: One level Bathroom Shower/Tub: Walk-in shower(no threshold) Special educational needs teacher: Yes Additional Comments: Lives in [facility]",   Lives With: Alone IADL History Homemaking Responsibilities: Yes Meal Prep Responsibility: Primary Laundry Responsibility: Primary Cleaning Responsibility: Primary(son would do vacuuming and mopping) Bill Paying/Finance Responsibility: No(son pays bills) Shopping Responsibility: No(son does shopping) Current License: Yes Mode of Transportation: Public relations account executive: Water quality scientist Prior Function Level of Independence: Requires assistive device for independence, Independent with basic ADLs Vacuuming: Total Shopping: Total(son now does shopping for pt, since last admission)  Able to Take Stairs?: Yes Driving: No(has not driven since last admission) Vocation: Retired(retired teacher) Comments: since recent hospitalization rollator and RW in the home ADL ADL Toileting: Contact guard Where Assessed-Toileting: Bedside Commode Toilet Transfer: Minimal assistance Toilet Transfer Method: Stand pivot Toilet Transfer Equipment: Bedside commode Vision Baseline Vision/History: Wears glasses Wears Glasses: Reading only Patient Visual Report: No change from baseline(reports double vision on admission, but has resolved) Vision Assessment?: No apparent visual deficits Eye Alignment: Within Functional Limits Ocular Range of Motion: Within Functional Limits Alignment/Gaze Preference: Within Defined Limits Tracking/Visual Pursuits: Able to track stimulus in all quads without difficulty Perception  Perception: Within Functional Limits Praxis Praxis: Intact Cognition Overall Cognitive Status: Within Functional Limits for tasks assessed Arousal/Alertness: Awake/alert Orientation Level: Person;Place;Situation Person: Oriented Place: Oriented Situation: Oriented Year: 2020 Month: March Day of Week: Correct Immediate Memory Recall: Sock;Blue;Bed Memory Recall: Sock;Blue;Bed Memory Recall Sock: Without Cue Memory Recall  Blue: Without Cue Memory Recall Bed: Without Cue Attention: Selective;Sustained Sustained Attention: Appears intact Selective Attention: Appears intact Problem Solving: Appears intact Safety/Judgment: Impaired Sensation Sensation Light Touch: Appears  Intact Proprioception: Impaired by gross assessment Coordination Gross Motor Movements are Fluid and Coordinated: No Fine Motor Movements are Fluid and Coordinated: No Coordination and Movement Description: LUE and LLE dysmetria  Finger Nose Finger Test: L decreased speed and accurancy, dysmetia Heel Shin Test: L decreased speed and accurancy, dysmetia Motor  Motor Motor: Hemiplegia;Ataxia Motor - Skilled Clinical Observations: LUE and LLE ataxia/ dysmetria  Mobility  Bed Mobility Bed Mobility: Rolling Right;Rolling Left;Supine to Sit;Sit to Supine Rolling Right: Supervision/verbal cueing Rolling Left: Supervision/Verbal cueing Supine to Sit: Supervision/Verbal cueing Sit to Supine: Supervision/Verbal cueing Transfers Sit to Stand: Minimal Assistance - Patient > 75% Stand to Sit: Minimal Assistance - Patient > 75%  Trunk/Postural Assessment  Cervical Assessment Cervical Assessment: Exceptions to [facility] Cervical AROM Overall Cervical AROM Comments: limited AROM due to recent cervical fracture 11/2017 Thoracic Assessment Thoracic Assessment: Exceptions to WFL(kyphosis) Lumbar Assessment Lumbar Assessment: Exceptions to WFL(posterior pelvic tilt) Postural Control Postural Control: Deficits on evaluation(absent anterior/posterior hip and ankle strategies, pt with frequent anterior LOB) Righting Reactions: delayed Protective Responses: delayed  Balance Static Sitting Balance Static Sitting - Level of Assistance: 6: Modified independent (Device/Increase time) Dynamic Sitting Balance Dynamic Sitting - Level of Assistance: 5: Stand by assistance Static Standing Balance Static Standing - Level of Assistance: 4: Min assist Dynamic  Standing Balance Dynamic Standing - Level of Assistance: 3: Mod assist Extremity/Trunk Assessment RUE Assessment RUE Assessment: Within Functional Limits General Strength Comments: strength grossly 4/5 LUE Assessment LUE Assessment: Within Functional Limits Active Range of Motion (AROM) Comments: WFL, Ataxic General Strength Comments: strength grossly 4/5     Refer to Care Plan for Long Term Goals  Recommendations for other services: None    Discharge Criteria: Patient will be discharged from OT if patient refuses treatment 3 consecutive times without medical reason, if treatment goals not met, if there is a change in medical status, if patient makes no progress towards goals or if patient is discharged from hospital.  The above assessment, treatment plan, treatment alternatives and goals were discussed and mutually agreed upon: by patient  Simonne Come 06/06/2018, 10:45 AM

## 2018-06-07 LAB — GLUCOSE, CAPILLARY
GLUCOSE-CAPILLARY: 106 mg/dL — AB (ref 70–99)
Glucose-Capillary: 120 mg/dL — ABNORMAL HIGH (ref 70–99)
Glucose-Capillary: 102 mg/dL — ABNORMAL HIGH (ref 70–99)
Glucose-Capillary: 152 mg/dL — ABNORMAL HIGH (ref 70–99)

## 2018-06-07 NOTE — Progress Notes (Signed)
[facility] PHYSICAL MEDICINE & REHABILITATION PROGRESS NOTE   Subjective/Complaints: No new issues. Moving bowels this morning. States that therapy went well yestereday  ROS: Patient denies fever, rash, sore throat, blurred vision, nausea, vomiting, diarrhea, cough, shortness of breath or chest pain, joint or back pain, headache, or mood change.     Objective:   No results found. Recent Labs    06/06/18 0638  WBC 7.1  HGB 8.7*  HCT 30.2*  PLT 216   Recent Labs    06/06/18 0638  NA 141  K 3.8  CL 107  CO2 25  GLUCOSE 99  BUN 26*  CREATININE 0.80  CALCIUM 8.4*    Intake/Output Summary (Last 24 hours) at 06/07/2018 1000 Last data filed at 06/07/2018 0750 Gross per 24 hour  Intake 720 ml  Output -  Net 720 ml     Physical Exam: Vital Signs Blood pressure (!) 123/58, pulse 71, temperature 98.4 F (36.9 C), temperature source Oral, resp. rate 16, height 5\' 5"  (1.651 m), weight 59.9 kg, SpO2 97 %. Constitutional: No distress . Vital signs reviewed. HEENT: EOMI, oral membranes moist Neck: supple Cardiovascular: RRR without murmur. No JVD    Respiratory: CTA Bilaterally without wheezes or rales. Normal effort    GI: BS +, non-tender, non-distended  Musculoskeletal:Normal range of motion.  Neurological: She isalertand oriented to person, place, and time. Sl ataxic speech.   LUE 3/5 prox to 4/5 distally with impaired FMC. LLE grossly 4/5. RUE and RLE 5/5. No focal sensory findings are appreciated. DTR's 3+ LUE and LLE and 2+ RUE/RLE. Mild left inattention with some apraxia  Skin: Skin iswarmand dry. She isnot diaphoretic.  Psychiatric: pleasant and cooperative     Assessment/Plan: 1. Functional deficits secondary to embolic right cortical infarct which require 3+ hours per day of interdisciplinary therapy in a comprehensive inpatient rehab setting.  Physiatrist is providing close team supervision and 24 hour management of active medical problems listed  below.  Physiatrist and rehab team continue to assess barriers to discharge/monitor patient progress toward functional and medical goals  Care Tool:  Bathing  Bathing activity did not occur: Refused(on toilet for majority of eval)           Bathing assist       Upper Body Dressing/Undressing Upper body dressing Upper body dressing/undressing activity did not occur (including orthotics): Refused(on toilet for majority of eval) What is the patient wearing?: Pull over shirt    Upper body assist Assist Level: Independent    Lower Body Dressing/Undressing Lower body dressing    Lower body dressing activity did not occur: Refused(on toilet for majority of eval) What is the patient wearing?: Underwear/pull up     Lower body assist Assist for lower body dressing: Minimal Assistance - Patient > 75%     Toileting Toileting    Toileting assist Assist for toileting: Contact Guard/Touching assist     Transfers Chair/bed transfer  Transfers assist     Chair/bed transfer assist level: Minimal Assistance - Patient > 75%     Locomotion Ambulation   Ambulation assist      Assist level: Minimal Assistance - Patient > 75% Assistive device: Walker-rolling Max distance: 120   Walk 10 feet activity   Assist     Assist level: Minimal Assistance - Patient > 75% Assistive device: Walker-rolling   Walk 50 feet activity   Assist    Assist level: Minimal Assistance - Patient > 75% Assistive device: Walker-rolling    Walk  150 feet activity   Assist Walk 150 feet activity did not occur: Safety/medical concerns         Walk 10 feet on uneven surface  activity   Assist     Assist level: Moderate Assistance - Patient - 50 - 74% Assistive device: Aeronautical engineer   Type of Wheelchair: Agricultural engineer assist level: Supervision/Verbal cueing Max wheelchair distance: 164ft    Wheelchair 50 feet with 2 turns  activity    Assist        Assist Level: Supervision/Verbal cueing   Wheelchair 150 feet activity     Assist     Assist Level: Supervision/Verbal cueing    Medical Problem List and Plan: 1.Functional and mobility deficitssecondary to embolic right cortical infarct --Continue CIR therapies including PT, OT  2. Antithrombotics: -DVT/anticoagulation:Pharmaceutical:Other (comment)--Eliquis -antiplatelet therapy: N/A 3.Chronic neck pain/Pain Management:On gabapentin tid with robaxin prn -no tramadol d/t intolerance  -added kpad prn 4. Mood:LCSW to follow for evaluation and support. -antipsychotic agents: N/A 5. Neuropsych: This patientiscapable of making decisions on herown behalf. 6. Skin/Wound Care:Routine pressure relief measures. 7. Fluids/Electrolytes/Nutrition:Monitor I/O.   I personally reviewed the patient's labs today.   -BUN trending up (26)---encourage fluids, held lasix  -recheck Monday  8. HTN: Monitor BP bid  -had been on low dose lasix (hold see #7). 9. T2DMwith peripheral neuropathy:Hgb A1c- 5.7. Diet controlled.  -controlled 3/15 10 A fib s/p BSJ:[id number] HR bid--continue amiodarone and Eliquis 11. DOE/Likely COPD: Recently started on Dulera.Resumed albuterol prn 12.R-CEA s/p CEA 3/11:  13. Anemia: On iron for supplement    -hgb with slight drop to 8.7 from 8.8  -no gross blood loss  -recheck next week  LOS: 2 days A FACE TO FACE EVALUATION WAS PERFORMED  Meredith Gonzalez 06/07/2018, 10:00 AM

## 2018-06-08 ENCOUNTER — Inpatient Hospital Stay (HOSPITAL_COMMUNITY): Payer: Medicare Other

## 2018-06-08 ENCOUNTER — Inpatient Hospital Stay (HOSPITAL_COMMUNITY): Payer: Medicare Other | Admitting: Occupational Therapy

## 2018-06-08 DIAGNOSIS — R269 Unspecified abnormalities of gait and mobility: Secondary | ICD-10-CM

## 2018-06-08 DIAGNOSIS — I69398 Other sequelae of cerebral infarction: Secondary | ICD-10-CM

## 2018-06-08 LAB — BASIC METABOLIC PANEL
Anion gap: 4 — ABNORMAL LOW (ref 5–15)
BUN: 20 mg/dL (ref 8–23)
CO2: 28 mmol/L (ref 22–32)
Calcium: 8.5 mg/dL — ABNORMAL LOW (ref 8.9–10.3)
Chloride: 108 mmol/L (ref 98–111)
Creatinine, Ser: 0.75 mg/dL (ref 0.44–1.00)
GFR calc Af Amer: >60 mL/min (ref >60)
GFR calc non Af Amer: >60 mL/min (ref >60)
Glucose, Bld: 112 mg/dL — ABNORMAL HIGH (ref 70–99)
Potassium: 4.5 mmol/L (ref 3.5–5.1)
Sodium: 140 mmol/L (ref 135–145)

## 2018-06-08 LAB — CBC
HCT: 30.4 % — ABNORMAL LOW (ref 36.0–46.0)
Hemoglobin: 9.1 g/dL — ABNORMAL LOW (ref 12.0–15.0)
MCH: 30.5 pg (ref 26.0–34.0)
MCHC: 29.9 g/dL — ABNORMAL LOW (ref 30.0–36.0)
MCV: 102 fL — AB (ref 80.0–100.0)
Platelets: 221 10*3/uL (ref 150–400)
RBC: 2.98 MIL/uL — ABNORMAL LOW (ref 3.87–5.11)
RDW: 20 % — ABNORMAL HIGH (ref 11.5–15.5)
WBC: 6.6 10*3/uL (ref 4.0–10.5)
nRBC: 0 % (ref 0.0–0.2)

## 2018-06-08 LAB — GLUCOSE, CAPILLARY
Glucose-Capillary: 103 mg/dL — ABNORMAL HIGH (ref 70–99)
Glucose-Capillary: 102 mg/dL — ABNORMAL HIGH (ref 70–99)
Glucose-Capillary: 102 mg/dL — ABNORMAL HIGH (ref 70–99)
Glucose-Capillary: 100 mg/dL — ABNORMAL HIGH (ref 70–99)

## 2018-06-08 NOTE — Progress Notes (Signed)
Inpatient Rehabilitation  Patient information reviewed and entered into eRehab system by Yakira Duquette M. Donyel Castagnola, M.A., CCC/SLP, PPS Coordinator.  Information including medical coding, functional ability and quality indicators will be reviewed and updated through discharge.    Per nursing patient was given "Data Collection Information Summary" for Patients in Inpatient Rehabilitation Facilities with attached "Privacy Act Statement-Health Care Records" upon admission.   

## 2018-06-08 NOTE — Progress Notes (Signed)
Social Work  Social Work Assessment and Plan  Patient Details  Name: Meredith Gonzalez MRN: [id number] Date of Birth: Jun 30, 1935  Today's Date: 06/08/2018  Problem List:  Patient Active Problem List   Diagnosis Date Noted  . Stroke due to embolism of carotid artery ([place]) 06/05/2018  . Stroke-like symptom   . Dyspnea   . Acute blood loss anemia   . Acute ischemic stroke ([place]) 05/31/2018  . Stroke ([place]) 05/30/2018  . Chronic diastolic congestive heart failure ([place])   . Anemia of chronic disease   . Hypoalbuminemia due to protein-calorie malnutrition ([place])   . Labile blood pressure   . Atrial fibrillation ([place])   . Chronic neck pain   . Subcortical infarction ([place]) 04/27/2018  . CVA (cerebral vascular accident) ([place]) 04/26/2018  . TIA (transient ischemic attack) 04/24/2018  . CAD (coronary artery disease) 08/07/2016  . (HFpEF) heart failure with preserved ejection fraction ([place]) 08/07/2016  . Ischemic cardiomyopathy 08/07/2016  . Chest pain   . NSTEMI (non-ST elevated myocardial infarction) ([place])   . Status post coronary artery stent placement   . Abnormal stress test   . SOB (shortness of breath)   . Multifocal pneumonia 07/13/2016  . Elevated troponin 07/13/2016  . Leukocytosis   . Pacemaker 05/17/2014  . Complications, pacemaker cardiac, mechanical 12/31/2012  . Edema extremities 10/08/2012  . Persistent atrial fibrillation 10/08/2012  . Hypertension 10/08/2012  . Hypothyroidism 10/08/2012  . Neuropathy 10/08/2012  . Diabetes ([place]) 10/08/2012  . Depression 09/08/2012  . Anemia 08/08/2012  . Knee fracture, left 08/07/2012  . Recurrent falls 08/07/2012  . Syncope 08/07/2012  . SDH (subdural hematoma) ([place]) 08/07/2012   Past Medical History:  Past Medical History:  Diagnosis Date  . Anemia   . Anxiety   . Arthritis    "all over" (02/11/2018)  . Basal cell carcinoma    "left leg" (02/11/2018)  . Cervical spine fracture ([place]) 12/2017   "C1-2"  . CHF (congestive  heart failure) ([place])   . Chronic bronchitis ([place])   . Chronic neck pain    "since I broke my neck 6-8 wk ago" (02/11/2018)  . Diabetic peripheral neuropathy ([place])   . Fibromyalgia   . Fracture of right humerus   . Generalized weakness   . Headache    "weekly" (02/11/2018)  . History of blood transfusion    "related to one of my femur surgeries" (02/11/2018)  . History of echocardiogram    Echo 8/18: EF 50-55, no RWMA, Gr 1 DD, calcified AV leaflets, MAC, trivial MR, mod LAE, PASP 37  . Hypertension   . Hypothyroidism   . Incontinence of urine   . Ischemic stroke ([place]) 2000   "lost part of the vision in my right eye" (02/11/2018)  . Major depression, chronic   . Myocardial infarction ([place]) 06/2016  . Pacemaker   . Recurrent falls   . Syncope and collapse   . Type 2 diabetes, diet controlled ([place])    Past Surgical History:  Past Surgical History:  Procedure Laterality Date  . ABDOMINAL HYSTERECTOMY    . ANKLE FRACTURE SURGERY Right   . BASAL CELL CARCINOMA EXCISION Left    "leg"  . CARDIAC CATHETERIZATION  ~ 2014  . CARDIOVERSION N/A 02/13/2018   Procedure: CARDIOVERSION;  Surgeon: Sueanne Margarita, MD;  Location: [facility] ENDOSCOPY;  Service: Cardiovascular;  Laterality: N/A;  . CARDIOVERSION N/A 03/27/2018   Procedure: CARDIOVERSION;  Surgeon: Lelon Perla, MD;  Location: [facility];  Service: Cardiovascular;  Laterality: N/A;  . CARPAL TUNNEL RELEASE     bilateral  . CATARACT EXTRACTION W/ INTRAOCULAR LENS  IMPLANT, BILATERAL Bilateral   . CHOLECYSTECTOMY OPEN    . COLONOSCOPY    . CORONARY ANGIOPLASTY WITH STENT PLACEMENT  06/2016  . CORONARY STENT INTERVENTION N/A 07/19/2016   Procedure: Coronary Stent Intervention;  Surgeon: Peter M Martinique, MD;  Location: [facility] CV LAB;  Service: Cardiovascular;  Laterality: N/A;  . ENDARTERECTOMY Right 06/03/2018   Procedure: ENDARTERECTOMY CAROTID;  Surgeon: Rosetta Posner, MD;  Location: [facility];  Service: Vascular;  Laterality:  Right;  . EYE SURGERY     right eye catarace/lens implant  . FEMUR FRACTURE SURGERY Bilateral   . FRACTURE SURGERY    . GASTRIC BYPASS    . INSERT / REPLACE / REMOVE PACEMAKER  ~ 2012  . LEAD REVISION N/A 01/01/2013   Procedure: LEAD REVISION;  Surgeon: Evans Lance, MD;  Location[facility] CATH LAB;  Service: Cardiovascular;  Laterality: N/A;  . LEFT HEART CATH AND CORONARY ANGIOGRAPHY N/A 07/19/2016   Procedure: Left Heart Cath and Coronary Angiography;  Surgeon: Peter M Martinique, MD;  Location: [facility] CV LAB;  Service: Cardiovascular;  Laterality: N/A;  . OVARIAN CYST SURGERY     "one side only"  . PATCH ANGIOPLASTY Right 06/03/2018   Procedure: PATCH ANGIOPLASTY USING HEMASHIELD PLATINUM FINESSE;  Surgeon: Rosetta Posner, MD;  Location: [facility];  Service: Vascular;  Laterality: Right;  . TEE WITHOUT CARDIOVERSION N/A 02/13/2018   Procedure: TRANSESOPHAGEAL ECHOCARDIOGRAM (TEE);  Surgeon: Sueanne Margarita, MD;  Location: [facility] ENDOSCOPY;  Service: Cardiovascular;  Laterality: N/A;  . TONSILLECTOMY     Social History:  reports that she quit smoking about 45 years ago. Her smoking use included cigarettes. She has a 2.00 pack-year smoking history. She has never used smokeless tobacco. She reports current alcohol use. She reports that she does not use drugs.  Family / Support Systems Marital Status: Widow/Widower Patient Roles: Parent Children: Lennette Bihari [phone] Other Supports: Daughter in-law and grandchildren Anticipated Caregiver: Son Ability/Limitations of Caregiver: Son and daughter in-law work full time, but can make arrangements to provide care if needed Caregiver Availability: (Depends upon her care needed) Family Dynamics: Close wiht her son and his family they check on her daily and son trasnports now to appointments and grocery store. She is a very independent lady and doesn't want to burden anyone but to take care of her own needs.   Social History Preferred language:  English Religion: Christian Cultural Background: No issues Education: High School Read: Yes Write: Yes Employment Status: Retired Public relations account executive Issues: No issues Guardian/Conservator: None-according to MD pt is capable of making her own decisions while here, she does want her son involved in any decision neede to be made while here   Abuse/Neglect Abuse/Neglect Assessment Can Be Completed: Yes Physical Abuse: Denies Verbal Abuse: Denies Sexual Abuse: Denies Exploitation of patient/patient's resources: Denies Self-Neglect: Denies  Emotional Status Pt's affect, behavior and adjustment status: Pt is sore with her throat and it hurts to swallow, but she knows this too shall past. She likes her home in the retirement community and wants to return to it, but need sot be mod/i level. 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  Recent Psychosocial Issues: pacemaker monitor hopefully still working, her other one was not monitoring her heart rate Psychiatric History: None-deferred depression screen but seems to be doing well and verbalizes her concerns and feeelings. She  is one that feels this is another challenge and to pull up your pants and deal with it. She states: " When you get my age you have to be ready for anything." Substance Abuse History: NO issues  Patient / Family Perceptions, Expectations & Goals Pt/Family understanding of illness & functional limitations: Pt can explain her surgery and knew this was going to happen after her last hospitalization, she is glad she did niot hurt herself when she fell. She talks wiht the MD's and feels she has a good understanding of her condition and plans going forward. Premorbid pt/family roles/activities: Mom, grandmother, church member, retiree, Social research officer, government Anticipated changes in roles/activities/participation: resume Pt/family expectations/goals: Pt states: " I want to get back to where I was before I was doing well up until this."  Son  states: " I hope she is moving like when she went home from rehab before, last month."  [employer]: Other (Comment)(Been to [facility] x2) Premorbid Home Care/DME Agencies: Other (Comment)([facility] was seeing her prior to admission) Transportation available at discharge: Son does the driving now she has not since DC 2/13 Resource referrals recommended: Support group (specify)  Discharge Planning Living Arrangements: Alone Support Systems: Children, Water engineer, Other relatives, Church/faith community Type of Residence: Private residence Insurance Resources: [employer], Multimedia programmer (specify)([employer]) Financial Resources: Radio broadcast assistant Screen Referred: No Living Expenses: Rent Money Management: Patient, Family Does the patient have any problems obtaining your medications?: No Home Management: Self and son helps with the heavey cleaning pt can not do now Patient/Family Preliminary Plans: Pt would like to return to her home and not have to consider ALF or going to son's home. Son and daughter in-law do work full time and would need to take tiome off to provide care to pt. She reports was doing well unitl this fall at home. Sw Barriers to Discharge: Decreased caregiver support Sw Barriers to Discharge Comments: Does not have 24 hr care Social Work Anticipated Follow Up Needs: HH/OP, Support Group  Clinical Impression Pleasant female who is motivated to do well and was recently here on rehab. She was mod/i level with her rollator and was doing well at home according to pt. Will evaluate and see if discharge plan is safe for her and work with son and pt on safe plan for her. She is active with home health and will resume this at discharge from rehab.  Elease Hashimoto 06/08/2018, 11:30 AM

## 2018-06-08 NOTE — Progress Notes (Signed)
Occupational Therapy Session Note  Patient Details  Name: Meredith Gonzalez MRN: [id number] Date of Birth: Dec 02, 1935  Today's Date: 06/08/2018 OT Individual Time: 1530-1600 OT Individual Time Calculation (min): 30 min    Short Term Goals: Week 1:  OT Short Term Goal 1 (Week 1): Pt will complete toilet transfers with Supervision OT Short Term Goal 2 (Week 1): Pt will complete 2 grooming tasks in standing to demonstrate increased activity tolerance OT Short Term Goal 3 (Week 1): Pt will utilize LUE at diminished level during self-care tasks with min cues OT Short Term Goal 4 (Week 1): Pt will complete simple meal prep activity with supervision  Skilled Therapeutic Interventions/Progress Updates:    Pt received sitting up in recliner with no c/o pain. Pt used RW and completed 10 ft of functional mobility with CGA to w/c. Pt was transported to therapy gym where she completed BUE coordination task in standing to challenge dynamic standing balance. Intermittent CGA-min A provided for balance correction. Pt reporting fatigue in arms and pain in neck as described below. Pt returned to room and was left sitting up in recliner with chair alarm belt fastened and all needs met.   Therapy Documentation Precautions:  Precautions Precautions: Fall Precaution Comments: many falls with Sept neck fracture Restrictions Weight Bearing Restrictions: No Pain: Pain Assessment Pain Scale: 0-10 Pain Score: 6  Pain Type: Acute pain;Surgical pain Pain Location: Neck Pain Orientation: Right Pain Descriptors / Indicators: Aching Pain Frequency: Intermittent Pain Onset: On-going Pain Intervention(s): Medication (See eMAR)   Therapy/Group: Individual Therapy  Curtis Sites 06/08/2018, 7:53 AM

## 2018-06-08 NOTE — Care Management (Signed)
Inpatient [facility] Individual Statement of Services  Patient Name:  Meredith Gonzalez  Date:  06/08/2018  Welcome to the [facility].  Our goal is to provide you with an individualized program based on your diagnosis and situation, designed to meet your specific needs.  With this comprehensive rehabilitation program, you will be expected to participate in at least 3 hours of rehabilitation therapies Monday-Friday, with modified therapy programming on the weekends.  Your rehabilitation program will include the following services:  Physical Therapy (PT), Occupational Therapy (OT), Speech Therapy (ST), 24 hour per day rehabilitation nursing, Therapeutic Recreaction (TR), Case Management (Social Worker), Rehabilitation Medicine, Nutrition Services and Pharmacy Services  Weekly team conferences will be held on Wedensday to discuss your progress.  Your Social Worker will talk with you frequently to get your input and to update you on team discussions.  Team conferences with you and your family in attendance may also be held.  Expected length of stay: 10-14 days  Overall anticipated outcome: independent with device  Depending on your progress and recovery, your program may change. Your Social Worker will coordinate services and will keep you informed of any changes. Your Social Worker's name and contact numbers are listed  below.  The following services may also be recommended but are not provided by the [facility]:   [facility] will be made to provide these services after discharge if needed.  Arrangements include referral to agencies that provide these services.  Your insurance has been verified to be:  [employer] Your primary doctor is:  Sinclair Ship  Pertinent information will be shared with your doctor and your insurance company.  Social Worker:  Ovidio Kin, [place] or ([phone]  Information discussed with and copy given to patient by: Elease Hashimoto, 06/08/2018, 11:15 AM

## 2018-06-08 NOTE — Progress Notes (Signed)
Occupational Therapy Session Note  Patient Details  Name: Meredith Gonzalez MRN: [id number] Date of Birth: [birth year]  Today's Date: 06/08/2018 OT Individual Time: 7014-1030 OT Individual Time Calculation (min): 75 min    Short Term Goals: Week 1:  OT Short Term Goal 1 (Week 1): Pt will complete toilet transfers with Supervision OT Short Term Goal 2 (Week 1): Pt will complete 2 grooming tasks in standing to demonstrate increased activity tolerance OT Short Term Goal 3 (Week 1): Pt will utilize LUE at diminished level during self-care tasks with min cues OT Short Term Goal 4 (Week 1): Pt will complete simple meal prep activity with supervision  Skilled Therapeutic Interventions/Progress Updates:    Pt received in bed agreeable to therapy.  Pt declined a shower today as she stated she was washed up earlier this am.  Pt sat to EOB with S and donned UB clothing with set up. Pt reports that she has been using a sock aide, reacher, shoe horn for quite some time at home.  Because that AE was not in the room, pt needed mod A to donn LB just to start over feet.  Later found some AE that pt could use tomorrow. Pt then used RW to walk from her bed into hallway 50 ft. On the last 10-15 feet pt began to lean forward with her hips lagging behind as she got tired. She then used wc the rest of the way to the gym.   To address LE and core strength needed for balance, pt transferred to mat and moved into supine: 15 x bridges focusing on glute squeeze, 3 sets 15x pelvic tilt, 3 sets 10 x spinal rotation with knee twists side to side, 3 sets  Pt did need some A to sit up as she felt a strain in her neck.  She then felt quite dizzy and needed to sit still for a few minutes and then dizziness passed.   Pt transferred to arm chair to practice arm push ups 10 x3.   Then focused on sit to stands to RW with close S to occasional CGA.   Pt resting in arm chair as her PT arrived for her next session.  Therapy  Documentation Precautions:  Precautions Precautions: Fall Precaution Comments: many falls with Sept neck fracture Restrictions Weight Bearing Restrictions: No    Vital Signs: Oxygen Therapy SpO2: 97 % O2 Device: Room Air Pain: Pain Assessment Pain Scale: 0-10 Pain Score: 6  - neck surgical site   Therapy/Group: Individual Therapy  [place] 06/08/2018, 12:15 PM

## 2018-06-08 NOTE — Progress Notes (Signed)
[facility] PHYSICAL MEDICINE & REHABILITATION PROGRESS NOTE   Subjective/Complaints: Mild pain with swallowing   ROS: Patient denies CP, SOB, N/V/D    Objective:   No results found. Recent Labs    06/06/18 0638 06/08/18 0639  WBC 7.1 6.6  HGB 8.7* 9.1*  HCT 30.2* 30.4*  PLT 216 221   Recent Labs    06/06/18 0638 06/08/18 0639  NA 141 140  K 3.8 4.5  CL 107 108  CO2 25 28  GLUCOSE 99 112*  BUN 26* 20  CREATININE 0.80 0.75  CALCIUM 8.4* 8.5*    Intake/Output Summary (Last 24 hours) at 06/08/2018 0853 Last data filed at 06/07/2018 2000 Gross per 24 hour  Intake 720 ml  Output -  Net 720 ml     Physical Exam: Vital Signs Blood pressure (!) 154/70, pulse 79, temperature 98 F (36.7 C), temperature source Oral, resp. rate 18, height 5\' 5"  (1.651 m), weight 59.9 kg, SpO2 97 %. Constitutional: No distress . Vital signs reviewed. HEENT: EOMI, oral membranes moist Neck: supple Cardiovascular: RRR without murmur. No JVD    Respiratory: CTA Bilaterally without wheezes or rales. Normal effort    GI: BS +, non-tender, non-distended  Musculoskeletal:Normal range of motion.  Neurological: She isalertand oriented to person, place, and time. Sl ataxic speech.   LUE 3/5 prox to 4/5 distally with impaired FMC. LLE grossly 4/5. RUE and RLE 5/5. No focal sensory findings are appreciated. DTR's 3+ LUE and LLE and 2+ RUE/RLE. Mild left inattention with some apraxia  Skin: Skin iswarmand dry. She isnot diaphoretic.  Psychiatric: pleasant and cooperative     Assessment/Plan: 1. Functional deficits secondary to embolic right cortical infarct which require 3+ hours per day of interdisciplinary therapy in a comprehensive inpatient rehab setting.  Physiatrist is providing close team supervision and 24 hour management of active medical problems listed below.  Physiatrist and rehab team continue to assess barriers to discharge/monitor patient progress toward functional and  medical goals  Care Tool:  Bathing  Bathing activity did not occur: Refused(on toilet for majority of eval) Body parts bathed by patient: Right arm, Left arm, Chest, Abdomen, Right upper leg, Left upper leg, Front perineal area, Buttocks, Face   Body parts bathed by helper: Left lower leg, Right lower leg     Bathing assist Assist Level: Independent     Upper Body Dressing/Undressing Upper body dressing Upper body dressing/undressing activity did not occur (including orthotics): Refused(on toilet for majority of eval) What is the patient wearing?: Pull over shirt    Upper body assist Assist Level: Independent    Lower Body Dressing/Undressing Lower body dressing    Lower body dressing activity did not occur: Refused(on toilet for majority of eval) What is the patient wearing?: Pants, Underwear/pull up     Lower body assist Assist for lower body dressing: Moderate Assistance - Patient 50 - 74%     Toileting Toileting    Toileting assist Assist for toileting: Contact Guard/Touching assist     Transfers Chair/bed transfer  Transfers assist     Chair/bed transfer assist level: Contact Guard/Touching assist     Locomotion Ambulation   Ambulation assist      Assist level: Minimal Assistance - Patient > 75% Assistive device: Walker-rolling Max distance: 120   Walk 10 feet activity   Assist     Assist level: Minimal Assistance - Patient > 75% Assistive device: Walker-rolling   Walk 50 feet activity   Assist  Assist level: Minimal Assistance - Patient > 75% Assistive device: Walker-rolling    Walk 150 feet activity   Assist Walk 150 feet activity did not occur: Safety/medical concerns         Walk 10 feet on uneven surface  activity   Assist     Assist level: Moderate Assistance - Patient - 50 - 74% Assistive device: Aeronautical engineer   Type of Wheelchair: Agricultural engineer assist level:  Supervision/Verbal cueing Max wheelchair distance: 179ft    Wheelchair 50 feet with 2 turns activity    Assist        Assist Level: Supervision/Verbal cueing   Wheelchair 150 feet activity     Assist     Assist Level: Supervision/Verbal cueing    Medical Problem List and Plan: 1.Functional and mobility deficitssecondary to embolic right cortical infarct --Continue CIR therapies including PT, OT   2. Antithrombotics: -DVT/anticoagulation:Pharmaceutical:Other (comment)--Eliquis -antiplatelet therapy: N/A 3.Chronic neck pain/Pain Management:On gabapentin tid with robaxin prn -no tramadol d/t intolerance  -added kpad prn 4. Mood:LCSW to follow for evaluation and support. -antipsychotic agents: N/A 5. Neuropsych: This patientiscapable of making decisions on herown behalf. 6. Skin/Wound Care:Routine pressure relief measures. 7. Fluids/Electrolytes/Nutrition:Monitor I/O.   I personally reviewed the patient's labs today.   -BUN trending up (26)---encourage fluids, held lasix  25-75% meals 8. HTN: Monitor BP bid  -had been on low dose lasix (hold see #7). Vitals:   06/07/18 2032 06/08/18 0610  BP:  (!) 154/70  Pulse:  79  Resp:  18  Temp:  98 F (36.7 C)  SpO2: 98% 97%  controlled  9. T2DMwith peripheral neuropathy:Hgb A1c- 5.7. Diet controlled.  -controlled 3/15 10 A fib s/p GEZ:MOQHUTM HR bid--continue amiodarone and Eliquis 11. DOE/Likely COPD: Recently started on Dulera.Resumed albuterol prn 12.R-CEA s/p CEA 3/11: monitor wound and dysphagia 13. Anemia: On iron for supplement    -hgb with slight drop to 8.7 from 8.8, stable vs ?trending higher at 9.1 on 3/16  -no gross blood loss  -recheck next week  LOS: 3 days A FACE TO [place] K[place]a [place] 06/08/2018, 8:53 AM

## 2018-06-08 NOTE — Progress Notes (Signed)
Physical Therapy Session Note  Patient Details  Name: Meredith Gonzalez MRN: [id number] Date of Birth: 1935-06-09  Today's Date: 06/08/2018 PT Individual Time: 1035-1130 PT Individual Time Calculation (min): 55 min   Short Term Goals: Week 1:  PT Short Term Goal 1 (Week 1): Pt will perform transfers with supervision assist and LRAD  PT Short Term Goal 2 (Week 1): Pt will ambulate 139ft with supervision assist and and LRAD  PT Short Term Goal 3 (Week 1): Pt will maintain standing x 3 minutes with supervsion assist from PT   Skilled Therapeutic Interventions/Progress Updates:    Pt received in therapy gym from OT this session, agreeable to therapy tx and denies pain. Pt ambulated x 80 ft with RW and CGA working on endurance and activity tolerance, pt reports dizziness this session. Therapist checked BP in sitting 149/67 and HR 81 bpm. Pt performed sit<>stand with RW and CGA, BP in standing 123/61 and HR 79 bpm. Pt demontrates orthostatic hypotension with standing, educated pt on increasing fluids/water intake and wearing ted hoes. Pt worked on dynamic standing balance this session without AD including gait without AD x 60 ft, toe taps forwards on 4 inch step, and toe taps sideways on 4 inch step, all with min-mod assist. Pt demonstrates significant fear of falling, therapist providing emotional support and education. Pt worked on LE strength to perform x 10 sit<>stands from elevated mat without UE support, min assist. Pt worked on dynamic standing balance on airex with feet apart and feet together, no UE support, min assist. Pt transported back to room and transferred to recliner, left seated in recliner with needs in reach and chair alarm set.   Therapy Documentation Precautions:  Precautions Precautions: Fall Precaution Comments: many falls with Sept neck fracture Restrictions Weight Bearing Restrictions: No    Therapy/Group: Individual Therapy  Netta Corrigan, PT, DPT 06/08/2018, 7:59 AM

## 2018-06-08 NOTE — IPOC Note (Signed)
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 ([place])     Functional Problem List: Nursing Medication Management, Pain, Endurance, Motor, Safety, Skin Integrity  PT Balance, Endurance, Motor, Pain, Safety, Skin Integrity  OT Balance, Endurance, Motor, Pain, Safety, Sensory, Skin Integrity, Vision  SLP    TR         Basic ADL's: OT Eating, Grooming, Bathing, Dressing, Toileting     Advanced  ADL's: OT Simple Meal Preparation, Light Housekeeping     Transfers: PT Bed Mobility, Bed to Chair, Car, Sara Benninger, Futures trader, Metallurgist: PT Stairs, Emergency planning/management officer, Ambulation     Additional Impairments: OT Fuctional Use of Upper Extremity  SLP        TR      Anticipated Outcomes Item Anticipated Outcome  Self Feeding Mod I  Swallowing      Basic self-care  Mod I  Toileting  Mod I   Bathroom Transfers Mod I  Bowel/Bladder  Pt will manage bowel and bladder with min assist   Transfers  Mod I with LRAD   Locomotion  Ambulatory Mod I with LRAD at household level   Communication     Cognition     Pain  Pt will mangage pain at 2 or less on a scale of 0-10  Safety/Judgment  Pt will remain free of falls with injury while in rehab with min assist    Therapy Plan: PT Intensity: Minimum of 1-2 x/day ,45 to 90 minutes PT Duration Estimated Length of Stay: 11-14 days  OT Intensity: Minimum of 1-2 x/day, 45 to 90 minutes OT Frequency: 5 out of 7 days OT Duration/Estimated Length of Stay: 10-12 days      Team Interventions: Nursing Interventions Patient/Family Education, Bladder Management, Pain Management, Medication Management, Skin Care/Wound Management, Discharge Planning  PT interventions Ambulation/gait training, Balance/vestibular training, Discharge  planning, DME/adaptive equipment instruction, Pain management, Psychosocial support, Splinting/orthotics, Therapeutic Activities, UE/LE Strength taining/ROM, Visual/perceptual remediation/compensation, Wheelchair propulsion/positioning, UE/LE Coordination activities, Therapeutic Exercise, Stair training, Skin care/wound management, Patient/family education, Neuromuscular re-education, Functional electrical stimulation, Disease management/prevention, Academic librarian, Functional mobility training  OT Interventions Training and development officer, Cognitive remediation/compensation, Academic librarian, Discharge planning, Disease mangement/prevention, Engineer, drilling, Functional mobility training, Neuromuscular re-education, Pain management, Patient/family education, Psychosocial support, Self Care/advanced ADL retraining, Therapeutic Activities, Therapeutic Exercise, UE/LE Strength taining/ROM, UE/LE Coordination activities, Visual/perceptual remediation/compensation  SLP Interventions    TR Interventions    SW/CM Interventions Discharge Planning, Psychosocial Support, Patient/Family Education   Barriers to Discharge MD  Medical stability and Lack of/limited family support  Nursing      PT Decreased caregiver support, Medical stability    OT Decreased caregiver support    SLP      SW       Team Discharge Planning: Destination: PT-Home ,OT- Home , SLP-  Projected Follow-up: PT-Home health PT, OT-  Home health OT, SLP-  Projected Equipment Needs: PT- , OT- None recommended by OT, SLP-  Equipment Details: PT- , OT-  Patient/family involved in discharge planning: PT- Patient,  OT-Patient, SLP-   MD ELOS: 10-14d Medical Rehab Prognosis:  Good Assessment:  83 year old female with history of CHF, HTN, cervical stenosis with chronic pain, T2DM, A fib s/p PPM, dyspnea likely due to COPD, CVA with recent CIR stay (d/c 05/07/18) related to severe  R-ICA stenosis who was  admitted on 05/31/18 with fall backwards day PTA followed by dizziness, left sided weakness and speech difficulty. CTA head/neck negative for emergent findings and upto 60-65% with no wall abnormality. Carotid dopplers revealed R-40-59% ICA stenosis. Dr. Erlinda Hong felt that stroke was embolic likely due to A fib as patient with recurrent similar symptoms. Eliquis increased to 5 mg bid.  VVS consulted for input and recommended CEA due to "moderate to severe extremely irregular right bifurcation plaque". She underwent R-CEA with dacron patch angioplasty by Dr. Donnetta Hutching on 06/03/18 and Eliquis resumed  Post op reporting issues with dysphagia.  Now requiring 24/7 Rehab RN,MD, as well as CIR level PT, OT and SLP.  Treatment team will focus on ADLs and mobility with goals set at Mod I See Team Conference Notes for weekly updates to the plan of care

## 2018-06-09 ENCOUNTER — Inpatient Hospital Stay (HOSPITAL_COMMUNITY): Payer: Medicare Other | Admitting: Occupational Therapy

## 2018-06-09 ENCOUNTER — Inpatient Hospital Stay (HOSPITAL_COMMUNITY): Payer: Medicare Other | Admitting: Physical Therapy

## 2018-06-09 ENCOUNTER — Inpatient Hospital Stay (HOSPITAL_COMMUNITY): Payer: Self-pay | Admitting: Physical Therapy

## 2018-06-09 LAB — GLUCOSE, CAPILLARY
GLUCOSE-CAPILLARY: 89 mg/dL (ref 70–99)
Glucose-Capillary: 106 mg/dL — ABNORMAL HIGH (ref 70–99)
Glucose-Capillary: 104 mg/dL — ABNORMAL HIGH (ref 70–99)
Glucose-Capillary: 183 mg/dL — ABNORMAL HIGH (ref 70–99)

## 2018-06-09 NOTE — Progress Notes (Signed)
Physical Therapy Session Note  Patient Details  Name: Meredith Gonzalez MRN: [id number] Date of Birth: 09-26-1935  Today's Date: 06/09/2018 PT Individual Time: 1300-1400 PT Individual Time Calculation (min): 60 min   Short Term Goals: Week 1:  PT Short Term Goal 1 (Week 1): Pt will perform transfers with supervision assist and LRAD  PT Short Term Goal 2 (Week 1): Pt will ambulate 163ft with supervision assist and and LRAD  PT Short Term Goal 3 (Week 1): Pt will maintain standing x 3 minutes with supervsion assist from PT   Skilled Therapeutic Interventions/Progress Updates:   Pt received sitting in recliner and agreeable to therapy session. Pt performed sit<>stand tranfsers throughout session with and without RW with CGA/close supervision for safety. Pt ambulated ~233ft using RW to therapy gym with CGA/supervision for steadying/safety. Pt performed standing balance task of PNF cross body reaching from higher to lower surface with feet shoulder width apart focusing on weightshifting and reaching outside BOS with CGA up to mod A for balance throughout with pt demonstrating most difficulty reaching items on the lower left side. Performed standing balance task of horseshoes with focus on reaching outside BOS and internal perturbations via UE movement with CGA throughout for steadying. Pt ambulated ~66ft x2 without AD with min A for balance and pt demonstrating hesitancy with steps frequently stopping to regain balance. Performed lateral sidestepping at hallway rail with initial B UE support progressed to no UE support with pt demonstrating equal step length and moderate speed with min A throughout for balance. Performed alternating B LE cone taps with focus on increased time in single leg stance with min A to mod A for balance throughout and pt demonstrating increased difficulty with R LE weightshift and tapping with L LE. Attempted stepping strategy retraining with posterior lean and cuing for timing of  backwards step to regain balance with pt demonstrating inadequate step length requiring max A for recovery to prevent LOB. Attempted lateral lean with stepping strategy retraining with pt demonstrating increased step length; however, inadequate to regain balance with pt also having lateral trunk lean further causing LOB requiring max A for recovery. Pt ambulated ~223ft using RW to room with CGA/supervision for steadying/safety. Pt left sitting in recliner with needs in reach and belt alarm on.   Therapy Documentation Precautions:  Precautions Precautions: Fall Precaution Comments: many falls with Sept neck fracture Restrictions Weight Bearing Restrictions: No  Vital Signs: Pt continued to deny dizziness throughout therapy session Pain: No reports of pain during session   Therapy/Group: Individual Therapy  Tawana Scale, PT, DPT 06/09/2018, 3:07 PM

## 2018-06-09 NOTE — Plan of Care (Signed)
  Problem: Consults Goal: RH GENERAL PATIENT EDUCATION Description See Patient Education module for education specifics. Outcome: Progressing   Problem: RH SKIN INTEGRITY Goal: RH STG SKIN FREE OF INFECTION/BREAKDOWN Description No new breakdown with min assist   Outcome: Progressing Goal: RH STG ABLE TO PERFORM INCISION/WOUND CARE W/ASSISTANCE Description STG Able To Perform Incision/Wound Care With Min Assistance.  Outcome: Progressing   Problem: RH SAFETY Goal: RH STG ADHERE TO SAFETY PRECAUTIONS W/ASSISTANCE/DEVICE Description STG Adhere to Safety Precautions With Min Assistance/Device.  Outcome: Progressing   Problem: RH PAIN MANAGEMENT Goal: RH STG PAIN MANAGED AT OR BELOW PT'S PAIN GOAL Description <3 out of 10.   Outcome: Progressing   

## 2018-06-09 NOTE — Progress Notes (Signed)
[facility] PHYSICAL MEDICINE & REHABILITATION PROGRESS NOTE   Subjective/Complaints:  No issues overnite  Pt emotionally labile, when thinking about home and her husband  ROS: Patient denies CP, SOB, N/V/D    Objective:   No results found. Recent Labs    06/08/18 0639  WBC 6.6  HGB 9.1*  HCT 30.4*  PLT 221   Recent Labs    06/08/18 0639  NA 140  K 4.5  CL 108  CO2 28  GLUCOSE 112*  BUN 20  CREATININE 0.75  CALCIUM 8.5*    Intake/Output Summary (Last 24 hours) at 06/09/2018 0819 Last data filed at 06/08/2018 1700 Gross per 24 hour  Intake 480 ml  Output -  Net 480 ml     Physical Exam: Vital Signs Blood pressure (!) 141/61, pulse 71, temperature 98.1 F (36.7 C), temperature source Oral, resp. rate 12, height 5\' 5"  (1.651 m), weight 59.9 kg, SpO2 99 %. Constitutional: No distress . Vital signs reviewed. HEENT: EOMI, oral membranes moist Neck: supple Cardiovascular: RRR without murmur. No JVD    Respiratory: CTA Bilaterally without wheezes or rales. Normal effort    GI: BS +, non-tender, non-distended  Musculoskeletal:Normal range of motion.  Neurological: She isalertand oriented to person, place, and time. Sl ataxic speech.   LUE 3/5 prox to 4/5 distally with impaired FMC. LLE grossly 4/5. RUE and RLE 5/5. No focal sensory findings are appreciated. DTR's 3+ LUE and LLE and 2+ RUE/RLE. Mild left inattention with some apraxia  Skin: Skin iswarmand dry. She isnot diaphoretic.  Psychiatric: pleasant and cooperative     Assessment/Plan: 1. Functional deficits secondary to embolic right cortical infarct which require 3+ hours per day of interdisciplinary therapy in a comprehensive inpatient rehab setting.  Physiatrist is providing close team supervision and 24 hour management of active medical problems listed below.  Physiatrist and rehab team continue to assess barriers to discharge/monitor patient progress toward functional and medical goals  Care  Tool:  Bathing  Bathing activity did not occur: Refused(on toilet for majority of eval) Body parts bathed by patient: Right arm, Left arm, Chest, Abdomen, Right upper leg, Left upper leg, Front perineal area, Buttocks, Face   Body parts bathed by helper: Left lower leg, Right lower leg     Bathing assist Assist Level: Independent     Upper Body Dressing/Undressing Upper body dressing Upper body dressing/undressing activity did not occur (including orthotics): Refused(on toilet for majority of eval) What is the patient wearing?: Pull over shirt    Upper body assist Assist Level: Set up assist    Lower Body Dressing/Undressing Lower body dressing    Lower body dressing activity did not occur: Refused(on toilet for majority of eval) What is the patient wearing?: Underwear/pull up, Pants     Lower body assist Assist for lower body dressing: Moderate Assistance - Patient 50 - 74%     Toileting Toileting    Toileting assist Assist for toileting: Contact Guard/Touching assist     Transfers Chair/bed transfer  Transfers assist     Chair/bed transfer assist level: Contact Guard/Touching assist     Locomotion Ambulation   Ambulation assist      Assist level: Minimal Assistance - Patient > 75% Assistive device: Walker-rolling Max distance: 100 ft   Walk 10 feet activity   Assist     Assist level: Minimal Assistance - Patient > 75% Assistive device: Walker-rolling   Walk 50 feet activity   Assist    Assist level: Minimal  Assistance - Patient > 75% Assistive device: Walker-rolling    Walk 150 feet activity   Assist Walk 150 feet activity did not occur: Safety/medical concerns         Walk 10 feet on uneven surface  activity   Assist     Assist level: Moderate Assistance - Patient - 50 - 74% Assistive device: Aeronautical engineer   Type of Wheelchair: Agricultural engineer assist level: Supervision/Verbal  cueing Max wheelchair distance: 177ft    Wheelchair 50 feet with 2 turns activity    Assist        Assist Level: Supervision/Verbal cueing   Wheelchair 150 feet activity     Assist     Assist Level: Supervision/Verbal cueing    Medical Problem List and Plan: 1.Functional and mobility deficitssecondary to embolic right cortical infarct --Continue CIR therapies including PT, OT team conf in am  2. Antithrombotics: -DVT/anticoagulation:Pharmaceutical:Other (comment)--Eliquis -antiplatelet therapy: N/A 3.Chronic neck pain/Pain Management:On gabapentin tid with robaxin prn -no tramadol d/t intolerance  -added kpad prn 4. Mood:LCSW to follow for evaluation and support. -antipsychotic agents: N/A 5. Neuropsych: This patientiscapable of making decisions on herown behalf. 6. Skin/Wound Care:Routine pressure relief measures. 7. Fluids/Electrolytes/Nutrition:Monitor I/O.   I personally reviewed the patient's labs today.   -BUN trending up (26)---encourage fluids, held lasix  25-75% meals 8. HTN: Monitor BP bid  -had been on low dose lasix (hold see #7). Vitals:   06/09/18 0447 06/09/18 0742  BP: (!) 141/61   Pulse: 71   Resp: 12   Temp: 98.1 F (36.7 C)   SpO2: 98% 99%  controlled 3/17 9. T2DMwith peripheral neuropathy:Hgb A1c- 5.7. Diet controlled.  -controlled 3/15 10 A fib s/p PTW:SFKCLEX HR bid--continue amiodarone and Eliquis 11. DOE/Likely COPD: Recently started on Dulera.Resumed albuterol prn 12.R-CEA s/p CEA 3/11: monitor wound and dysphagia 13. Anemia: On iron for supplement    -hgb with slight drop to 8.7 from 8.8, stable vs ?trending higher at 9.1 on 3/16  -no gross blood loss  -recheck next week  LOS: 4 days A FACE TO [place] E Panhia Karl 06/09/2018, 8:19 AM

## 2018-06-09 NOTE — Progress Notes (Signed)
Occupational Therapy Session Note  Patient Details  Name: Meredith Gonzalez MRN: [id number] Date of Birth: Jul 21, 1935  Today's Date: 06/09/2018 OT Individual Time: 1045-1200 OT Individual Time Calculation (min): 75 min    Short Term Goals: Week 1:  OT Short Term Goal 1 (Week 1): Pt will complete toilet transfers with Supervision OT Short Term Goal 2 (Week 1): Pt will complete 2 grooming tasks in standing to demonstrate increased activity tolerance OT Short Term Goal 3 (Week 1): Pt will utilize LUE at diminished level during self-care tasks with min cues OT Short Term Goal 4 (Week 1): Pt will complete simple meal prep activity with supervision  Skilled Therapeutic Interventions/Progress Updates:    Upon entering the room, pt seated in recliner chair with no c/o pain and agreeable to OT intervention. OT assisted pt via wheelchair to ADL apartment. OT provided education on safety with kitchen mobility with use of RW based on home set up. Pt able to provide demonstrations with use of RW to obtain needed items from refrigerator and demonstrated how to move items with supervision overall. Pt engaged in dynavision task while standing for 3 minutes and reaching out of base of support without LOB and no UE support and an average of 1.82 seconds. Challenge increased with use of air ex foam and pt standing with min A for balance progressing to supervision for 1 minute without LOB. Pt taking seated rest break and ambulating back to room with min guard. Pt returning to recliner chair and chair alarm donned for safety. Call bell and all needed items within reach.   Therapy Documentation Precautions:  Precautions Precautions: Fall Precaution Comments: many falls with Sept neck fracture Restrictions Weight Bearing Restrictions: No  Pain: Pain Assessment Pain Scale: 0-10 Pain Score: 6  Pain Type: Acute pain Pain Location: Neck Pain Orientation: Right Pain Radiating Towards: head Pain Descriptors /  Indicators: Aching;Headache Pain Frequency: Intermittent Pain Onset: On-going Pain Intervention(s): Repositioned;Heat applied;Refused(refused robaxin, tylenol given at [phone]) ADL: ADL Toileting: Contact guard Where Assessed-Toileting: Bedside Commode Toilet Transfer: Minimal assistance Toilet Transfer Method: Stand pivot Toilet Transfer Equipment: Bedside commode   Therapy/Group: Individual Therapy  [name] 06/09/2018, 12:48 PM

## 2018-06-09 NOTE — Progress Notes (Signed)
Physical Therapy Session Note  Patient Details  Name: Meredith Gonzalez MRN: [id number] Date of Birth: 26-Oct-1935  Today's Date: 06/09/2018 PT Individual Time: 0800-0900 PT Individual Time Calculation (min): 60 min   Short Term Goals: Week 1:  PT Short Term Goal 1 (Week 1): Pt will perform transfers with supervision assist and LRAD  PT Short Term Goal 2 (Week 1): Pt will ambulate 124ft with supervision assist and and LRAD  PT Short Term Goal 3 (Week 1): Pt will maintain standing x 3 minutes with supervsion assist from PT   Skilled Therapeutic Interventions/Progress Updates: Pt presented in bed agreeable to therapy. Performed bed mobility with use of bed features and supervision. Pt ambulated to bathroom with RW and S and lowered brief/pants with supervision. PTA threaded new pants and donned socks/shoes total A for time management. Pt returned to sink with RW and performed hand hygiene supervision. After brief rest pt ambulated to rehab gym with RW and close S. Pt participated in balance activities including placing clothespins while standing on Airex and removing clothespins with ipsilateral/contralateral LE on 4in step. Pt noted to have increased L lateral lean with RLE on step requiring minA for correction. Pt also performed toe taps and alternating toe taps no AD with pt having x 1 LOB with alternating toe taps requiring modA from PTA for recovery. After brief rest pt ambulated back to room with RW and CGA as noted intermittent R foot catching. Pt sat in recliner when returned to room and left with belt alarm on, call bell within reach and nsg present.      Therapy Documentation Precautions:  Precautions Precautions: Fall Precaution Comments: many falls with Sept neck fracture Restrictions Weight Bearing Restrictions: No General:   Vital Signs: Therapy Vitals Temp: 98.4 F (36.9 C) Temp Source: Oral Pulse Rate: 69 Resp: 17 BP: (!) 115/57 Patient Position (if appropriate):  Sitting Oxygen Therapy SpO2: 99 % O2 Device: Room Air   Therapy/Group: Individual Therapy  Meredith Gonzalez  Arhaan Chesnut, PTA  06/09/2018, 4:18 PM

## 2018-06-10 ENCOUNTER — Inpatient Hospital Stay (HOSPITAL_COMMUNITY): Payer: Medicare Other | Admitting: Occupational Therapy

## 2018-06-10 ENCOUNTER — Inpatient Hospital Stay (HOSPITAL_COMMUNITY): Payer: Medicare Other | Admitting: Physical Therapy

## 2018-06-10 LAB — GLUCOSE, CAPILLARY
Glucose-Capillary: 91 mg/dL (ref 70–99)
Glucose-Capillary: 133 mg/dL — ABNORMAL HIGH (ref 70–99)
Glucose-Capillary: 93 mg/dL (ref 70–99)
Glucose-Capillary: 147 mg/dL — ABNORMAL HIGH (ref 70–99)

## 2018-06-10 NOTE — Progress Notes (Signed)
Occupational Therapy Session Note  Patient Details  Name: Meredith Gonzalez MRN: [id number] Date of Birth: 1936/03/04  Today's Date: 06/10/2018 OT Individual Time: 1245-1340 OT Individual Time Calculation (min): 55 min    Short Term Goals: Week 1:  OT Short Term Goal 1 (Week 1): Pt will complete toilet transfers with Supervision OT Short Term Goal 2 (Week 1): Pt will complete 2 grooming tasks in standing to demonstrate increased activity tolerance OT Short Term Goal 3 (Week 1): Pt will utilize LUE at diminished level during self-care tasks with min cues OT Short Term Goal 4 (Week 1): Pt will complete simple meal prep activity with supervision  Skilled Therapeutic Interventions/Progress Updates:    Patient seated in recliner and ready for therapy session.  She is pleasant, cooperative and aware of needs.  Ambulation with RW to/from therapy gym with CG A, one mild LOB with min A to regain balance.  Completed seated and standing balance and stretching activities with CG/min A, Core strengthening / posture activities .  Patient returned to recliner and set up for lunch with call bell in reach and seat belt alarm set.    Therapy Documentation Precautions:  Precautions Precautions: Fall Precaution Comments: many falls with Sept neck fracture Restrictions Weight Bearing Restrictions: No General:   Vital Signs: Therapy Vitals Temp: 98.3 F (36.8 C) Temp Source: Oral Pulse Rate: 66 Resp: 16 BP: (!) 125/54 Patient Position (if appropriate): Sitting Oxygen Therapy SpO2: 100 % O2 Device: Room Air Pain: Pain Assessment Pain Scale: 0-10 Pain Score: 0-No pain Pain Type: Acute pain Pain Location: Head Pain Orientation: Mid Pain Descriptors / Indicators: Headache Pain Frequency: Constant Pain Onset: On-going Patients Stated Pain Goal: 2 Pain Intervention(s): Medication (See eMAR);Emotional support   Other Treatments:     Therapy/Group: Individual Therapy  Carlos Levering 06/10/2018, 4:03 PM

## 2018-06-10 NOTE — Patient Care Conference (Signed)
Inpatient RehabilitationTeam Conference and Plan of Care Update Date: 06/10/2018   Time: 10:50 AM    Patient Name: Meredith Gonzalez      Medical Record Number: [id number]  Date of Birth: 24-Jan-1936 Sex: Female         Room/Bed: 4W18C/4W18C-01 Payor Info: Payor: MEDICARE / Plan: MEDICARE PART A AND B / Product Type: *No Product type* /    Admitting Diagnosis: cva  Admit Date/Time:  06/05/2018  4:05 PM Admission Comments: No comment available   Primary Diagnosis:  <principal problem not specified> Principal Problem: <principal problem not specified>  Patient Active Problem List   Diagnosis Date Noted  . 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 Stroke-like symptom   . Dyspnea   . Acute blood loss anemia   . Acute ischemic stroke ([place]) 05/31/2018  . Stroke ([place]) 05/30/2018  . Chronic diastolic congestive heart failure ([place])   . Anemia of chronic disease   . Hypoalbuminemia due to protein-calorie malnutrition ([place])   . Labile blood pressure   . Atrial fibrillation ([place])   . Chronic neck pain   . Subcortical infarction ([place]) 04/27/2018  . CVA (cerebral vascular accident) ([place]) 04/26/2018  . TIA (transient ischemic attack) 04/24/2018  . CAD (coronary artery disease) 08/07/2016  . (HFpEF) heart failure with preserved ejection fraction ([place]) 08/07/2016  . Ischemic cardiomyopathy 08/07/2016  . Chest pain   . NSTEMI (non-ST elevated myocardial infarction) ([place])   . Status post coronary artery stent placement   . Abnormal stress test   . SOB (shortness of breath)   . Multifocal pneumonia 07/13/2016  . Elevated troponin 07/13/2016  . Leukocytosis   . Pacemaker 05/17/2014  . Complications, pacemaker cardiac, mechanical 12/31/2012  . Edema extremities 10/08/2012  . Persistent atrial fibrillation 10/08/2012  . Hypertension 10/08/2012  . Hypothyroidism 10/08/2012  . Neuropathy 10/08/2012  . Diabetes ([place]) 10/08/2012  . Depression 09/08/2012  . Anemia 08/08/2012  .  Knee fracture, left 08/07/2012  . Recurrent falls 08/07/2012  . Syncope 08/07/2012  . SDH (subdural hematoma) ([place]) 08/07/2012    Expected Discharge Date: Expected Discharge Date: 06/24/18  Team Members Present: Physician leading conference: Dr. Alysia Penna Social Worker Present: Ovidio Kin, LCSW Nurse Present: Leonette Nutting, RN PT Present: Barrie Folk, PT;Rosita Dechalus, PTA OT Present: Darleen Crocker, OT SLP Present: Stormy Fabian, SLP PPS Coordinator present : Gunnar Fusi     Current Status/Progress Goal Weekly Team Focus  Medical   Blood pressure labile, heart rate within range, remains afebrile, no neck pain, fear of falling  Reduce fall risk, maintain medical stability  Work on confidence with ambulation   Bowel/Bladder   continent og bowel and bladder. LBM 3/16  remain continent of B/B normal bowel pattern  assist with toileting need prn   Swallow/Nutrition/ Hydration             ADL's   S - CGA with use of RW for self care, functional transfers/ambulation, and home management tasks  mod I overall  self care retraining, balance retraining, NMR, pt/family edu   Mobility   supervision bed mobility, CGA transfers, gait up to 149ft with CGA and use of RW, decreased step length and foot clearance with ambulation increasing fall risk   Mod I with LRAD   balance, d/c planning, endurance   Communication             Safety/Cognition/ Behavioral Observations            Pain  chronic shoulder pain, and complains of headache relieved with tylenol and Kpad  pain less than 2  assess pain qshift and PRN   Skin   no skin impairments  skin remain intact and free of infection  assess skin qshift and PRN      *See Care Plan and progress notes for long and short-term goals.     Barriers to Discharge  Current Status/Progress Possible Resolutions Date Resolved   Physician    Medical stability;Other (comments)  Fear of falling  Progressing towards goals  Continue  rehab establish optimal post discharge therapy program      Nursing                  PT                    OT                  SLP                SW Decreased caregiver support Does not have 24 hr care            Discharge Planning/Teaching Needs:  Pt plans to go back to her independent home, with son checking daily. Son is working on Games developer which will take up to 2 months      Team Discussion:  Goals mod/i level with rolling walker. Currently min assist and scared of falling. Chronic headache relieved with tylenol and has shoulder pain uses a k-pad for this. Plans on going home to her home and son to check daily on her.  Revisions to Treatment Plan:  DC 4/1    Continued Need for Acute Rehabilitation Level of Care: The patient requires daily medical management by a physician with specialized training in physical medicine and rehabilitation for the following conditions: Daily direction of a multidisciplinary physical rehabilitation program to ensure safe treatment while eliciting the highest outcome that is of practical value to the patient.: Yes Daily medical management of patient stability for increased activity during participation in an intensive rehabilitation regime.: Yes Daily analysis of laboratory values and/or radiology reports with any subsequent need for medication adjustment of medical intervention for : Neurological problems   I attest that I was present, lead the team conference, and concur with the assessment and plan of the team.   Elease Hashimoto 06/10/2018, 4:07 PM

## 2018-06-10 NOTE — Progress Notes (Signed)
Occupational Therapy Session Note  Patient Details  Name: Meredith Gonzalez MRN: [id number] Date of Birth: 27-Jun-1935  Today's Date: 06/10/2018 OT Individual Time: 5956-3875 OT Individual Time Calculation (min): 73 min    Short Term Goals: Week 1:  OT Short Term Goal 1 (Week 1): Pt will complete toilet transfers with Supervision OT Short Term Goal 2 (Week 1): Pt will complete 2 grooming tasks in standing to demonstrate increased activity tolerance OT Short Term Goal 3 (Week 1): Pt will utilize LUE at diminished level during self-care tasks with min cues OT Short Term Goal 4 (Week 1): Pt will complete simple meal prep activity with supervision  Skilled Therapeutic Interventions/Progress Updates:    Upon entering the room, pt supine in bed with no c/o pain. Pt agreeable to OT intervention and requesting to shower this session. Pt performed supine >sit with supervision and ambulating to bathroom with min guard for toileting needs. Pt perform clothing management and hygiene with overall supervision. Pt transferred onto shower chair with min guard for balance. Pt bathing self with use of LH sponge to increase I with self care. Pt transferred onto commode to don clothing items. Pt utilized Secondary school teacher as she does at home. She also verbalized using sock aide but one was not available for use this session and assist give. Pt seated in wheelchair at sink for grooming tasks secondary to fatigue with supervision and ambulating in same manner to recliner chair. Chair alarm donned and call bell within reach upon exiting the room.   Therapy Documentation Precautions:  Precautions Precautions: Fall Precaution Comments: many falls with Sept neck fracture Restrictions Weight Bearing Restrictions: No  Pain: Pain Assessment Pain Scale: 0-10 Pain Score: 5  Pain Type: Acute pain Pain Location: Head Pain Orientation: Mid Pain Radiating Towards: Neck Pain Descriptors / Indicators: Headache Pain Frequency:  Constant Pain Onset: On-going Patients Stated Pain Goal: 2 Pain Intervention(s): Relaxation ADL: ADL Toileting: Contact guard Where Assessed-Toileting: Bedside Commode Toilet Transfer: Minimal assistance Toilet Transfer Method: Arts development officer: Art gallery manager    Praxis   Exercises:   Other Treatments:     Therapy/Group: Individual Therapy  [name] 06/10/2018, 12:36 PM

## 2018-06-10 NOTE — Progress Notes (Signed)
[name] PHYSICAL MEDICINE & REHABILITATION PROGRESS NOTE   Subjective/Complaints:  No issues overnite, very fearful of falling, + trunkal insatbility noted with PT this am  ROS: Patient denies CP, SOB, N/V/D    Objective:   No results found. Recent Labs    06/08/18 0639  WBC 6.6  HGB 9.1*  HCT 30.4*  PLT 221   Recent Labs    06/08/18 0639  NA 140  K 4.5  CL 108  CO2 28  GLUCOSE 112*  BUN 20  CREATININE 0.75  CALCIUM 8.5*    Intake/Output Summary (Last 24 hours) at 06/10/2018 5056 Last data filed at 06/09/2018 1315 Gross per 24 hour  Intake 240 ml  Output -  Net 240 ml     Physical Exam: Vital Signs Blood pressure (!) 145/66, pulse 75, temperature 98 F (36.7 C), temperature source Oral, resp. rate 18, height _0  (1.651 m), weight 60.2 kg, SpO2 97 %. Constitutional: No distress . Vital signs reviewed. HEENT: EOMI, oral membranes moist Neck: supple Cardiovascular: RRR without murmur. No JVD    Respiratory: CTA Bilaterally without wheezes or rales. Normal effort    GI: BS +, non-tender, non-distended  Musculoskeletal:Normal range of motion.  Neurological: She isalertand oriented to person, place, and time. Sl ataxic speech.   LUE 3/5 prox to 4/5 distally with impaired FMC. LLE grossly 4/5. RUE and RLE 5/5. No focal sensory findings are appreciated. DTR's 3+ LUE and LLE and 2+ RUE/RLE. Mild left inattention with some apraxia  +trunkal ataxia Skin: Skin iswarmand dry. She isnot diaphoretic.  Psychiatric: pleasant and cooperative     Assessment/Plan: 1. Functional deficits secondary to embolic right cortical infarct which require 3+ hours per day of interdisciplinary therapy in a comprehensive inpatient rehab setting.  Physiatrist is providing close team supervision and 24 hour management of active medical problems listed below.  Physiatrist and rehab team continue to assess barriers to discharge/monitor patient progress toward functional and  medical goals  Care Tool:  Bathing  Bathing activity did not occur: Refused(on toilet for majority of eval) Body parts bathed by patient: Right arm, Left arm, Chest, Abdomen, Right upper leg, Left upper leg, Front perineal area, Buttocks, Face   Body parts bathed by helper: Left lower leg, Right lower leg     Bathing assist Assist Level: Independent     Upper Body Dressing/Undressing Upper body dressing Upper body dressing/undressing activity did not occur (including orthotics): Refused(on toilet for majority of eval) What is the patient wearing?: Pull over shirt    Upper body assist Assist Level: Set up assist    Lower Body Dressing/Undressing Lower body dressing    Lower body dressing activity did not occur: Refused(on toilet for majority of eval) What is the patient wearing?: Underwear/pull up, Pants     Lower body assist Assist for lower body dressing: Moderate Assistance - Patient 50 - 74%     Toileting Toileting    Toileting assist Assist for toileting: Contact Guard/Touching assist     Transfers Chair/bed transfer  Transfers assist     Chair/bed transfer assist level: Contact Guard/Touching assist     Locomotion Ambulation   Ambulation assist      Assist level: Contact Guard/Touching assist Assistive device: Walker-rolling Max distance: 275f   Walk 10 feet activity   Assist     Assist level: Contact Guard/Touching assist Assistive device: Walker-rolling   Walk 50 feet activity   Assist    Assist level: Contact Guard/Touching assist Assistive device:  Walker-rolling    Walk 150 feet activity   Assist Walk 150 feet activity did not occur: Safety/medical concerns  Assist level: Contact Guard/Touching assist Assistive device: Walker-rolling    Walk 10 feet on uneven surface  activity   Assist     Assist level: Moderate Assistance - Patient - 50 - 74% Assistive device: Aeronautical engineer   Type  of Wheelchair: Agricultural engineer assist level: Supervision/Verbal cueing Max wheelchair distance: 128f    Wheelchair 50 feet with 2 turns activity    Assist        Assist Level: Supervision/Verbal cueing    Wheelchair 150 feet activity     Assist     Assist Level: Supervision/Verbal cueing    Medical Problem List and Plan: 1.Functional and mobility deficitssecondary to embolic right cortical infarct --Continue CIR therapies including PT, OT Team conference today please see physician documentation under team conference tab, met with team face-to-face to discuss problems,progress, and goals. Formulized individual treatment plan based on medical history, underlying problem and comorbidities.  2. Antithrombotics: -DVT/anticoagulation:Pharmaceutical:Other (comment)--Eliquis -antiplatelet therapy: N/A 3.Chronic neck pain/Pain Management:On gabapentin tid with robaxin prn -no tramadol d/t intolerance  -added kpad prn 4. Mood:LCSW to follow for evaluation and support. -antipsychotic agents: N/A 5. Neuropsych: This patientiscapable of making decisions on herown behalf. 6. Skin/Wound Care:Routine pressure relief measures. 7. Fluids/Electrolytes/Nutrition:Monitor I/O.   I personally reviewed the patient's labs today.   -BUN trending up (26)---encourage fluids, held lasix  50% meals 8. HTN: Monitor BP bid  -had been on low dose lasix (hold see #7). Vitals:   06/10/18 0453 06/10/18 0830  BP: (!) 145/66   Pulse: 75   Resp: 18   Temp: 98 F (36.7 C)   SpO2: 95% 97%  controlled 3/18 9. T2DMwith peripheral neuropathy:Hgb A1c- 5.7. Diet controlled.  -controlled 3/15 10 A fib s/p PUAU:EBVPLWUHR bid--continue amiodarone and Eliquis 11. DOE/Likely COPD: Recently started on Dulera.Resumed albuterol prn 12.R-CEA s/p CEA 3/11: monitor wound and dysphagia 13. Anemia: On iron for supplement    -hgb  with slight drop to 8.7 from 8.8, stable vs ?trending higher at 9.1 on 3/16  -no gross blood loss  -recheck next week  LOS: 5 days A FACE TO [place] 06/10/2018, 9:23 AM

## 2018-06-10 NOTE — Progress Notes (Signed)
Social Work Patient ID: Meredith Gonzalez, female   DOB: [birth year], 83 y.o.   MRN: [id number] Met with pt to discuss team conference goals of mod/i level and target discharge date 4/1. She is feeling better with her balance and the tylenol is controlling her headache. She will let Miguel Dibble know of this and still the plan is for her to go back to her home with son coming in daily to check on her.

## 2018-06-10 NOTE — Progress Notes (Signed)
Physical Therapy Session Note  Patient Details  Name: Meredith Gonzalez MRN: [id number] Date of Birth: December 06, 1935  Today's Date: 06/10/2018 PT Individual Time: 0915-1030 PT Individual Time Calculation (min): 75 min   Short Term Goals: Week 1:  PT Short Term Goal 1 (Week 1): Pt will perform transfers with supervision assist and LRAD  PT Short Term Goal 2 (Week 1): Pt will ambulate 170f with supervision assist and and LRAD  PT Short Term Goal 3 (Week 1): Pt will maintain standing x 3 minutes with supervsion assist from PT   Skilled Therapeutic Interventions/Progress Updates: Pt presented in recliner agreeable to therapy. Pt ambulated supervision level to rehab gym with improved foot clearance this session. Per pt request PTA exchanged RW as pt felt tended to drift causing increased difficulty in managing. Pt also asked questions regarding RW tray which was discussed with previous therapist yesterday. PTA provided examples of trays of which pt was appreciative.  Pt participated in varying static and dynamic balance activities including stepping over walking stick forward/backwards/sidestepping without AD. Pt required much encouragement for stepping backwards as pt would intermittently step backwards then reaching for therapist causing posterior lean. Pt was able to progress with frequent rests to CGA and overall improved clearance over thresholds in all directions. Pt also performed "tug of war" resisting against dowel to encourage corrective stepping for balance. Pt required frequent verbal cues for decreasing posterior lean with activity. Pt ambulated x 541fwithout AD with CGA however pt making multiple stops due to increased anxiety and fear of falling. Provided pt edu on increasing arm swing during ambulation and to minimize outstretching arms when stopping to maintain balance. Pt ambulated back to room at end of session and returned to recliner. Pt left with belt alarm on, call bell within reach and  needs met.      Therapy Documentation Precautions:  Precautions Precautions: Fall Precaution Comments: many falls with Sept neck fracture Restrictions Weight Bearing Restrictions: No General:   Vital Signs: Therapy Vitals Temp: 98.3 F (36.8 C) Temp Source: Oral Pulse Rate: 66 Resp: 16 BP: (!) 125/54 Patient Position (if appropriate): Sitting Oxygen Therapy SpO2: 100 % O2 Device: Room Air Pain: Pain Assessment Pain Scale: 0-10 Pain Score: 0-No pain Pain Type: Acute pain Pain Location: Head Pain Orientation: Mid Pain Descriptors / Indicators: Headache Pain Frequency: Constant Pain Onset: On-going Patients Stated Pain Goal: 2 Pain Intervention(s): Medication (See eMAR);Emotional support    Therapy/Group: Individual Therapy  Gwendloyn Forsee  Bonny Egger, PTA  06/10/2018, 4:45 PM

## 2018-06-10 NOTE — Plan of Care (Signed)
  Problem: Consults Goal: RH GENERAL PATIENT EDUCATION Description See Patient Education module for education specifics. Outcome: Progressing   Problem: RH SKIN INTEGRITY Goal: RH STG SKIN FREE OF INFECTION/BREAKDOWN Description No new breakdown with min assist   Outcome: Progressing   Problem: RH SAFETY Goal: RH STG ADHERE TO SAFETY PRECAUTIONS W/ASSISTANCE/DEVICE Description STG Adhere to Safety Precautions With Min Assistance/Device.  Outcome: Progressing   Problem: RH PAIN MANAGEMENT Goal: RH STG PAIN MANAGED AT OR BELOW PT'S PAIN GOAL Description <3 out of 10.   Outcome: Not Progressing

## 2018-06-11 ENCOUNTER — Inpatient Hospital Stay (HOSPITAL_COMMUNITY): Payer: Medicare Other | Admitting: Occupational Therapy

## 2018-06-11 ENCOUNTER — Inpatient Hospital Stay (HOSPITAL_COMMUNITY): Payer: Medicare Other | Admitting: Physical Therapy

## 2018-06-11 ENCOUNTER — Other Ambulatory Visit: Payer: Medicare Other

## 2018-06-11 LAB — CBC
HCT: 30.1 % — ABNORMAL LOW (ref 36.0–46.0)
Hemoglobin: 8.7 g/dL — ABNORMAL LOW (ref 12.0–15.0)
MCH: 29.6 pg (ref 26.0–34.0)
MCHC: 28.9 g/dL — ABNORMAL LOW (ref 30.0–36.0)
MCV: 102.4 fL — ABNORMAL HIGH (ref 80.0–100.0)
NRBC: 0 % (ref 0.0–0.2)
Platelets: 248 10*3/uL (ref 150–400)
RBC: 2.94 MIL/uL — ABNORMAL LOW (ref 3.87–5.11)
RDW: 19.9 % — ABNORMAL HIGH (ref 11.5–15.5)
WBC: 6.6 10*3/uL (ref 4.0–10.5)

## 2018-06-11 LAB — GLUCOSE, CAPILLARY
GLUCOSE-CAPILLARY: 82 mg/dL (ref 70–99)
Glucose-Capillary: 79 mg/dL (ref 70–99)
Glucose-Capillary: 86 mg/dL (ref 70–99)
Glucose-Capillary: 77 mg/dL (ref 70–99)

## 2018-06-11 NOTE — Progress Notes (Signed)
Occupational Therapy Weekly Progress Note  Patient Details  Name: Meredith Gonzalez MRN: [id number] Date of Birth: 09-13-1935  Beginning of progress report period: June 05, 2017 End of progress report period: June 10, 2017  Today's Date: 06/11/2018 OT Individual Time: 1100-1200 and 1420-1529 OT Individual Time Calculation (min): 60 min and 69 min    Patient has met 4 of 4 short term goals. Pt making progress towards occupational therapy goals. Pt performing some functional transfers and self care tasks at supervision level. However, if pt is fatigued or higher level balance/dynamic balance challenge pt has increased difficulty often resulting in needing min A for safety. Pt have verbalized feeling nervous about returning home but wishes to continue working towards mod I with RW goal. Pt continues to benefit from OT intervention to address functional deficits.  Patient continues to demonstrate the following deficits: muscle weakness, decreased cardiorespiratoy endurance and decreased sitting balance, decreased standing balance and decreased balance strategies and therefore will continue to benefit from skilled OT intervention to enhance overall performance with BADL and iADL.  Patient progressing toward long term goals..  Continue plan of care.  OT Short Term Goals Week 1:  OT Short Term Goal 1 (Week 1): Pt will complete toilet transfers with Supervision OT Short Term Goal 1 - Progress (Week 1): Met OT Short Term Goal 2 (Week 1): Pt will complete 2 grooming tasks in standing to demonstrate increased activity tolerance OT Short Term Goal 2 - Progress (Week 1): Met OT Short Term Goal 3 (Week 1): Pt will utilize LUE at diminished level during self-care tasks with min cues OT Short Term Goal 3 - Progress (Week 1): Met OT Short Term Goal 4 (Week 1): Pt will complete simple meal prep activity with supervision OT Short Term Goal 4 - Progress (Week 1): Met Week 2:  OT Short Term Goal 1 (Week 2):  STGs=LTGs secondary to upcoming discharge  Skilled Therapeutic Interventions/Progress Updates:    session 1: Upon entering the room, pt seated in wheelchair with no c/o pain and agreeable to OT intervention. Pt requesting UE exercise as something she can "do on her own". OT educated and demonstrated use of level 2 resistive theraband for B UE strengthening exercises. Pt returned demonstrations with min verbal cuing for proper technique and paper handout given. Pt performing 2 sets of 10 chest pulls, shoulder elevation, shoulder diagonals, bicep curls, alternating punches with rest breaks as needed. Pt also engaged in 20 reps of guided pursed lip breathing with shoulder mobility exercises for shoulder depression, elevation, retraction, and protraction.  Pt remained in recliner chair at end of session with call bell and all needed items within reach.   Session 2: Upon entering the room, pt ambulating with RW to bathroom with close supervision. Pt able to perform clothing management and hygiene with close supervision. Pt returning to sit in wheelchair and OT assisted pt outside for energy conservation. Pt ambulating outside on uneven surface with RW and initially needing supervision but increasing to needing min A secondary to LOB to L. Pt needing min cuing to remain within RW. Pt taking seated rest break and OT discussed current discharge plan, pt's progress, energy conservation, and safety concerns within the home. Pt very active participant in education and verbalized understanding. Education to continue. OT assisted pt back to the unit via wheelchair. Pt transferred into recliner chair with close supervision for stand pivot transfer. Chair alarm donned. Call bell and all needed items within reach.   Therapy Documentation Precautions:  Precautions Precautions: Fall Precaution Comments: many falls with Sept neck fracture Restrictions Weight Bearing Restrictions: No Pain: Pain Assessment Pain Scale:  0-10 Pain Score: 4  Faces Pain Scale: Hurts little more Pain Type: Acute pain Pain Location: Head Pain Orientation: Right Pain Radiating Towards: Neck Pain Descriptors / Indicators: Headache;Aching Pain Frequency: Constant Pain Onset: On-going Pain Intervention(s): Medication (See eMAR) PAINAD (Pain Assessment in Advanced Dementia) Breathing: normal Negative Vocalization: none Facial Expression: smiling or inexpressive Body Language: relaxed Consolability: no need to console PAINAD Score: 0 ADL: ADL Toileting: Contact guard Where Assessed-Toileting: Bedside Commode Toilet Transfer: Minimal assistance Toilet Transfer Method: Stand pivot Toilet Transfer Equipment: Bedside commode   Therapy/Group: Individual Therapy  [name] 06/11/2018, 12:25 PM

## 2018-06-11 NOTE — Progress Notes (Signed)
[place] PHYSICAL MEDICINE & REHABILITATION PROGRESS NOTE   Subjective/Complaints:  No issues overnite, fear of falling discussed glut strengthening  ROS: Patient denies CP, SOB, N/V/D    Objective:   No results found. Recent Labs    06/11/18 0522  WBC 6.6  HGB 8.7*  HCT 30.1*  PLT 248   No results for input(s): NA, K, CL, CO2, GLUCOSE, BUN, CREATININE, CALCIUM in the last 72 hours.  Intake/Output Summary (Last 24 hours) at 06/11/2018 0905 Last data filed at 06/10/2018 1900 Gross per 24 hour  Intake 240 ml  Output -  Net 240 ml     Physical Exam: Vital Signs Blood pressure 136/67, pulse 69, temperature 98.9 F (37.2 C), temperature source Oral, resp. rate 16, height 5\' 5"  (1.651 m), weight 60.2 kg, SpO2 98 %. Constitutional: No distress . Vital signs reviewed. HEENT: EOMI, oral membranes moist Neck: supple Cardiovascular: RRR without murmur. No JVD    Respiratory: CTA Bilaterally without wheezes or rales. Normal effort    GI: BS +, non-tender, non-distended  Musculoskeletal:Normal range of motion.  Neurological: She isalertand oriented to person, place, and time. Sl ataxic speech.   LUE 3/5 prox to 4/5 distally with impaired FMC. LLE grossly 4/5. RUE and RLE 5/5. No focal sensory findings are appreciated. DTR's 3+ LUE and LLE and 2+ RUE/RLE. Mild left inattention with some apraxia  +trunkal ataxia Skin: Skin iswarmand dry. She isnot diaphoretic.  Psychiatric: pleasant and cooperative     Assessment/Plan: 1. Functional deficits secondary to embolic right cortical infarct which require 3+ hours per day of interdisciplinary therapy in a comprehensive inpatient rehab setting.  Physiatrist is providing close team supervision and 24 hour management of active medical problems listed below.  Physiatrist and rehab team continue to assess barriers to discharge/monitor patient progress toward functional and medical goals  Care Tool:  Bathing  Bathing activity  did not occur: Refused(on toilet for majority of eval) Body parts bathed by patient: Right arm, Left arm, Chest, Abdomen, Right upper leg, Left upper leg, Front perineal area, Buttocks, Face, Left lower leg, Right lower leg   Body parts bathed by helper: Left lower leg, Right lower leg     Bathing assist Assist Level: Supervision/Verbal cueing     Upper Body Dressing/Undressing Upper body dressing Upper body dressing/undressing activity did not occur (including orthotics): Refused(on toilet for majority of eval) What is the patient wearing?: Pull over shirt    Upper body assist Assist Level: Set up assist    Lower Body Dressing/Undressing Lower body dressing    Lower body dressing activity did not occur: Refused(on toilet for majority of eval) What is the patient wearing?: Underwear/pull up, Pants     Lower body assist Assist for lower body dressing: Minimal Assistance - Patient > 75%     Toileting Toileting    Toileting assist Assist for toileting: Contact Guard/Touching assist     Transfers Chair/bed transfer  Transfers assist     Chair/bed transfer assist level: Contact Guard/Touching assist     Locomotion Ambulation   Ambulation assist      Assist level: Contact Guard/Touching assist Assistive device: Walker-rolling Max distance: 27ft   Walk 10 feet activity   Assist     Assist level: Contact Guard/Touching assist Assistive device: Walker-rolling   Walk 50 feet activity   Assist    Assist level: Contact Guard/Touching assist Assistive device: Walker-rolling    Walk 150 feet activity   Assist Walk 150 feet activity did not  occur: Safety/medical concerns  Assist level: Contact Guard/Touching assist Assistive device: Walker-rolling    Walk 10 feet on uneven surface  activity   Assist     Assist level: Moderate Assistance - Patient - 50 - 74% Assistive device: Aeronautical engineer   Type of Wheelchair:  Agricultural engineer assist level: Supervision/Verbal cueing Max wheelchair distance: 161ft    Wheelchair 50 feet with 2 turns activity    Assist        Assist Level: Supervision/Verbal cueing    Wheelchair 150 feet activity     Assist     Assist Level: Supervision/Verbal cueing    Medical Problem List and Plan: 1.Functional and mobility deficitssecondary to embolic right cortical infarct --Continue CIR therapies including PT, OT   2. Antithrombotics: -DVT/anticoagulation:Pharmaceutical:Other (comment)--Eliquis -antiplatelet therapy: N/A 3.Chronic neck pain/Pain Management:On gabapentin tid with robaxin prn -no tramadol d/t intolerance  -added kpad prn 4. Mood:LCSW to follow for evaluation and support. -antipsychotic agents: N/A 5. Neuropsych: This patientiscapable of making decisions on herown behalf. 6. Skin/Wound Care:Routine pressure relief measures. 7. Fluids/Electrolytes/Nutrition:Monitor I/O.   I personally reviewed the patient's labs today.   -BUN trending up (26)---encourage fluids, held lasix  50% meals 8. HTN: Monitor BP bid  -had been on low dose lasix (hold see #7). Vitals:   06/11/18 0410 06/11/18 0728  BP: 136/67   Pulse: 69   Resp: 16   Temp: 98.9 F (37.2 C)   SpO2: 98% 98%  controlled 3/18 9. T2DMwith peripheral neuropathy:Hgb A1c- 5.7. Diet controlled.  -controlled 3/15 10 A fib s/p ZOX:WRUEAVW HR bid--continue amiodarone and Eliquis 11. DOE/Likely COPD: Recently started on Dulera.Resumed albuterol prn 12.R-CEA s/p CEA 3/11: monitor wound and dysphagia 13. Anemia: On iron for supplement    -hgb with slight drop to 8.7 from 8.8, stable vs ?trending higher at 9.1 on 3/16  -no gross blood loss  -recheck next week  LOS: 6 days A FACE TO [place] E Kirsteins 06/11/2018, 9:05 AM

## 2018-06-11 NOTE — Plan of Care (Signed)
  Problem: Consults Goal: RH GENERAL PATIENT EDUCATION Description See Patient Education module for education specifics. Outcome: Progressing   Problem: RH SKIN INTEGRITY Goal: RH STG ABLE TO PERFORM INCISION/WOUND CARE W/ASSISTANCE Description STG Able To Perform Incision/Wound Care With [employer].  Outcome: Progressing   Problem: RH SAFETY Goal: RH STG ADHERE TO SAFETY PRECAUTIONS W/ASSISTANCE/DEVICE Description STG Adhere to Safety Precautions With Min Assistance/Device.  Outcome: Progressing   Problem: RH PAIN MANAGEMENT Goal: RH STG PAIN MANAGED AT OR BELOW PT'S PAIN GOAL Description <3 out of 10.   Outcome: Not Progressing

## 2018-06-11 NOTE — Progress Notes (Signed)
Physical Therapy Session Note  Patient Details  Name: Meredith Gonzalez MRN: [id number] Date of Birth: 11-28-1935  Today's Date: 06/11/2018 PT Individual Time: 0905-1000 PT Individual Time Calculation (min): 55 min   Short Term Goals: Week 1:  PT Short Term Goal 1 (Week 1): Pt will perform transfers with supervision assist and LRAD  PT Short Term Goal 2 (Week 1): Pt will ambulate 153f with supervision assist and and LRAD  PT Short Term Goal 3 (Week 1): Pt will maintain standing x 3 minutes with supervsion assist from PT   Skilled Therapeutic Interventions/Progress Updates:   Pt received sitting in WC and agreeable to PT. [facility] for safety. Gait training with RW and supervision assist from PT 2x 2045f   PT isntructed pt in NMR to imrpove BLE gluteal strength Supine bridges, clam shells 2 x 8. Sit<>stand with BUE holding ball and tband at thighs to force glute activation. Side stepping at rail 2 x 1526fith tband to improve use of glutes. CGA to prevent compensatory movements.   Dynamic balance training on biodex, weight shift laterally and AP x 2 min . LOS x 1. Moderate cues for use of ankle strategy to increase improved weight shifts and to correct LOB. Patient returned to room and left sitting in WC [facility] call bell in reach and all needs met.         Therapy Documentation Precautions:  Precautions Precautions: Fall Precaution Comments: many falls with Sept neck fracture Restrictions Weight Bearing Restrictions: No    Vital Signs: Oxygen Therapy SpO2: 98 % O2 Device: Room Air Pain: Pain Assessment Pain Scale: 0-10 Pain Score: 6  Pain Type: Acute pain Pain Location: Head Pain Orientation: Right Pain Radiating Towards: neck Pain Descriptors / Indicators: Headache Pain Frequency: Constant Pain Onset: On-going Patients Stated Pain Goal: 2 Pain Intervention(s): Relaxation    Therapy/Group: Individual Therapy  AusLorie Phenix19/2020,  10:08 AM

## 2018-06-12 ENCOUNTER — Inpatient Hospital Stay (HOSPITAL_COMMUNITY): Payer: Medicare Other | Admitting: Physical Therapy

## 2018-06-12 ENCOUNTER — Inpatient Hospital Stay (HOSPITAL_COMMUNITY): Payer: Medicare Other | Admitting: Occupational Therapy

## 2018-06-12 LAB — GLUCOSE, CAPILLARY
Glucose-Capillary: 77 mg/dL (ref 70–99)
Glucose-Capillary: 95 mg/dL (ref 70–99)
Glucose-Capillary: 94 mg/dL (ref 70–99)
Glucose-Capillary: 135 mg/dL — ABNORMAL HIGH (ref 70–99)

## 2018-06-12 MED ORDER — SERTRALINE HCL 50 MG PO TABS
25.0000 mg | ORAL_TABLET | Freq: Every day | ORAL | Status: DC
  Administered 2018-06-12 – 2018-06-14 (×3): 25 mg via ORAL
  Filled 2018-06-12 (×4): qty 1

## 2018-06-12 NOTE — Progress Notes (Signed)
Physical Therapy Weekly Progress Note  Patient Details  Name: Meredith Gonzalez MRN: [id number] Date of Birth: 1935/08/01  Beginning of progress report period: June 06, 2018 End of progress report period: June 12, 2018  Today's Date: 06/12/2018 PT Individual Time: 0803-0901 PT Individual Time Calculation (min): 58 min   Patient has met 3 of 3 short term goals.  Pt making solid progress towards long term goals. Pt currently requires supervision assist from PT for all mobility including bed mobility, transfers, and gait with RW. Pt continues to have severe Fear of falling and mild coordination deficits on the L limiting safety and increased progress.   Patient continues to demonstrate the following deficits muscle weakness, decreased cardiorespiratoy endurance, decreased coordination and decreased standing balance, decreased postural control, hemiplegia and decreased balance strategies and therefore will continue to benefit from skilled PT intervention to increase functional independence with mobility.  Patient progressing toward long term goals..  Continue plan of care.  PT Short Term Goals Week 1:  PT Short Term Goal 1 (Week 1): Pt will perform transfers with supervision assist and LRAD  PT Short Term Goal 1 - Progress (Week 1): Met PT Short Term Goal 2 (Week 1): Pt will ambulate 167f with supervision assist and and LRAD  PT Short Term Goal 2 - Progress (Week 1): Met PT Short Term Goal 3 (Week 1): Pt will maintain standing x 3 minutes with supervsion assist from PT  PT Short Term Goal 3 - Progress (Week 1): Met Week 2:  PT Short Term Goal 1 (Week 2): STG=LTG due to ELOS  Skilled Therapeutic Interventions/Progress Updates:   Pt received sitting in WC and agreeable to PT. Gait training with RW, 1545f [id number] [id number] assist from PT with min cues for gait pattern in turns and step length to reduce fall risk.   Dynamic balance training without AD to perform lateral reach and  toss bean bag to target.  toss to target x 10 BUE on level surface and x 10 BUE on red wedge. Min assist from PT for safety with lateral reach. Wii Fit penguin slide x 2 and table tilt x 2. Moderate multimodal cues for improved use of ankle strategy to increase weight shift and min assist to prevent L/anterior LOB intermittently.   Patient returned to room and [facility] and all needs met.    POC Ambulation/gait training;Balance/vestibular training;Discharge planning;DME/adaptive equipment instruction;Pain management;Psychosocial support;Splinting/orthotics;Therapeutic Activities;UE/LE Strength taining/ROM;Visual/perceptual remediation/compensation;Wheelchair propulsion/positioning;UE/LE Coordination activities;Therapeutic Exercise;Stair training;Skin care/wound management;Patient/family education;Neuromuscular re-education;Functional electrical stimulation;Disease management/prevention;Community reintegration;Functional mobility training   Therapy Documentation Precautions:  Precautions Precautions: Fall Precaution Comments: many falls with Sept neck fracture Restrictions Weight Bearing Restrictions: No Vital Signs: Oxygen Therapy O2 Device: Room Air Pain: Pain Assessment Pain Scale: 0-10 Pain Score: 4  Pain Type: Acute pain Pain Location: Neck Pain Orientation: Mid Pain Descriptors / Indicators: Aching;Discomfort Pain Onset: Gradual Patients Stated Pain Goal: 2 Pain Intervention(s): Repositioned   Therapy/Group: Individual Therapy  AustLorie Phenix0/2020, 9:31 AM

## 2018-06-12 NOTE — Progress Notes (Signed)
Occupational Therapy Session Note  Patient Details  Name: Meredith Gonzalez MRN: [id number] Date of Birth: [birth year]  Today's Date: 06/12/2018 OT Individual Time: 1045-1200 OT Individual Time Calculation (min): 75 min    Short Term Goals: Week 2:  OT Short Term Goal 1 (Week 2): STGs=LTGs secondary to upcoming discharge  Skilled Therapeutic Interventions/Progress Updates:    Pt received in recliner, dressed and ready for the day. Pt agreeable to working on exercises to develop balance.  Pt worked on standing from [employer] with RW and rocking back and forth for ant/posterior weight shifting.  Pt was relying heavily on support on RW, removed walker and pt repeated with min A to support balance.  Standing and stepping 1 foot forward and back 10 x each side for 3 sides, working on wt shifting to bring foot back into neutral.  Lateral stepping and weight shifting with hand support on walker.   Pt worked on UE exercise with Level 2 theraband following her HEP with some small changes to arm positioning.   Provided pt with a reacher walker bag (one of her falls at home was reaching for her reacher that fell to the floor) and a mug with a handle as pt is having difficulty with her styrofoam cups.    Pt transferred back to recliner to rest. Belt alarm on and all needs met.    Therapy Documentation Precautions:  Precautions Precautions: Fall Precaution Comments: many falls with Sept neck fracture Restrictions Weight Bearing Restrictions: No    Vital Signs: Oxygen Therapy O2 Device: Room Air Pain: Pain Assessment Pain Scale: 0-10 Pain Score: 4  Pain Type: Acute pain Pain Location: Neck Pain Orientation: Mid Pain Descriptors / Indicators: Aching;Discomfort Pain Onset: Gradual Patients Stated Pain Goal: 2 Pain Intervention(s): Repositioned    Therapy/Group: Individual Therapy  [place] 06/12/2018, 8:51 AM

## 2018-06-12 NOTE — Progress Notes (Addendum)
[place] PHYSICAL MEDICINE & REHABILITATION PROGRESS NOTE   Subjective/Complaints:  Discussed therapy with PT, progressing well Pt without steps at home Emotional lability with crying ROS: Patient denies CP, SOB, N/V/D    Objective:   No results found. Recent Labs    06/11/18 0522  WBC 6.6  HGB 8.7*  HCT 30.1*  PLT 248   No results for input(s): NA, K, CL, CO2, GLUCOSE, BUN, CREATININE, CALCIUM in the last 72 hours.  Intake/Output Summary (Last 24 hours) at 06/12/2018 0918 Last data filed at 06/11/2018 1700 Gross per 24 hour  Intake 360 ml  Output -  Net 360 ml     Physical Exam: Vital Signs Blood pressure (!) 135/58, pulse 70, temperature 97.7 F (36.5 C), temperature source Oral, resp. rate 18, height 5\' 5"  (1.651 m), weight 60.2 kg, SpO2 95 %. Constitutional: No distress . Vital signs reviewed. HEENT: EOMI, oral membranes moist Neck: supple Cardiovascular: RRR without murmur. No JVD    Respiratory: CTA Bilaterally without wheezes or rales. Normal effort    GI: BS +, non-tender, non-distended  Musculoskeletal:Normal range of motion.  Neurological: She isalertand oriented to person, place, and time. Sl ataxic speech.   LUE 4/5with impaired FMC. LLE grossly 4/5. RUE and RLE 5/5. No focal sensory findings are appreciated. .  +trunkal ataxia Skin: Skin iswarmand dry. She isnot diaphoretic.  Psychiatric: pleasant and cooperative     Assessment/Plan: 1. Functional deficits secondary to embolic right cortical infarct which require 3+ hours per day of interdisciplinary therapy in a comprehensive inpatient rehab setting.  Physiatrist is providing close team supervision and 24 hour management of active medical problems listed below.  Physiatrist and rehab team continue to assess barriers to discharge/monitor patient progress toward functional and medical goals  Care Tool:  Bathing  Bathing activity did not occur: Refused(on toilet for majority of  eval) Body parts bathed by patient: Right arm, Left arm, Chest, Abdomen, Right upper leg, Left upper leg, Front perineal area, Buttocks, Face, Left lower leg, Right lower leg   Body parts bathed by helper: Left lower leg, Right lower leg     Bathing assist Assist Level: Supervision/Verbal cueing     Upper Body Dressing/Undressing Upper body dressing Upper body dressing/undressing activity did not occur (including orthotics): Refused(on toilet for majority of eval) What is the patient wearing?: Pull over shirt    Upper body assist Assist Level: Set up assist    Lower Body Dressing/Undressing Lower body dressing    Lower body dressing activity did not occur: Refused(on toilet for majority of eval) What is the patient wearing?: Underwear/pull up, Pants     Lower body assist Assist for lower body dressing: Minimal Assistance - Patient > 75%     Toileting Toileting    Toileting assist Assist for toileting: Contact Guard/Touching assist     Transfers Chair/bed transfer  Transfers assist     Chair/bed transfer assist level: Supervision/Verbal cueing     Locomotion Ambulation   Ambulation assist      Assist level: Supervision/Verbal cueing Assistive device: Walker-rolling Max distance: 22ft   Walk 10 feet activity   Assist     Assist level: Supervision/Verbal cueing Assistive device: Walker-rolling   Walk 50 feet activity   Assist    Assist level: Contact Guard/Touching assist Assistive device: Walker-rolling    Walk 150 feet activity   Assist Walk 150 feet activity did not occur: Safety/medical concerns  Assist level: Contact Guard/Touching assist Assistive device: Walker-rolling  Walk 10 feet on uneven surface  activity   Assist     Assist level: Minimal Assistance - Patient > 75% Assistive device: Aeronautical engineer   Type of Wheelchair: Manual    Wheelchair assist level: Supervision/Verbal cueing Max  wheelchair distance: 117ft    Wheelchair 50 feet with 2 turns activity    Assist        Assist Level: Supervision/Verbal cueing    Wheelchair 150 feet activity     Assist     Assist Level: Supervision/Verbal cueing    Medical Problem List and Plan: 1.Functional and mobility deficitssecondary to embolic right cortical infarct --Continue CIR therapies including PT, OT may be able to move up d/c date as fear of falling is subsiding  2. Antithrombotics: -DVT/anticoagulation:Pharmaceutical:Other (comment)--Eliquis -antiplatelet therapy: N/A 3.Chronic neck pain/Pain Management:On gabapentin tid with robaxin prn -no tramadol d/t intolerance  -added kpad prn 4. Mood:LCSW to follow for evaluation and support. -antipsychotic agents: N/A 5. Neuropsych: This patientiscapable of making decisions on herown behalf. 6. Skin/Wound Care:Routine pressure relief measures. 7. Fluids/Electrolytes/Nutrition:Monitor I/O.   I personally reviewed the patient's labs today.   -BUN trending up (26)---encourage fluids, held lasix I 364ml 8. HTN: Monitor BP bid  -had been on low dose lasix (hold see #7). Vitals:   06/11/18 1919 06/12/18 0324  BP: 119/67 (!) 135/58  Pulse: 75 70  Resp: 17 18  Temp: 98.1 F (36.7 C) 97.7 F (36.5 C)  SpO2: 96% 95%  controlled 3/20 9. T2DMwith peripheral neuropathy:Hgb A1c- 5.7. Diet controlled.  -controlled 3/15 10 A fib s/p AJO:INOMVEH HR bid--continue amiodarone and Eliquis 11. DOE/Likely COPD: Recently started on Dulera.Resumed albuterol prn 12.R-CEA s/p CEA 3/11: monitor wound and dysphagia 13. Anemia: stable   -no gross blood loss  -recheck next week 14.  Emotional lability post CVA - trial sertraline25mg  LOS: 7 days A FACE TO FACE EVALUATION WAS PERFORMED  Charlett Blake 06/12/2018, 9:18 AM

## 2018-06-12 NOTE — Plan of Care (Signed)
  Problem: Consults Goal: RH GENERAL PATIENT EDUCATION Description See Patient Education module for education specifics. Outcome: Progressing   Problem: RH SKIN INTEGRITY Goal: RH STG SKIN FREE OF INFECTION/BREAKDOWN Description No new breakdown with min assist   Outcome: Progressing Goal: RH STG ABLE TO PERFORM INCISION/WOUND CARE W/ASSISTANCE Description STG Able To Perform Incision/Wound Care With Min Assistance.  Outcome: Progressing   Problem: RH SAFETY Goal: RH STG ADHERE TO SAFETY PRECAUTIONS W/ASSISTANCE/DEVICE Description STG Adhere to Safety Precautions With Min Assistance/Device.  Outcome: Progressing   Problem: RH PAIN MANAGEMENT Goal: RH STG PAIN MANAGED AT OR BELOW PT'S PAIN GOAL Description <3 out of 10.   Outcome: Progressing   

## 2018-06-12 NOTE — Progress Notes (Signed)
Occupational Therapy Session Note  Patient Details  Name: Meredith Gonzalez MRN: [id number] Date of Birth: 05-26-1935  Today's Date: 06/12/2018 OT Individual Time: 1430-1525 OT Individual Time Calculation (min): 55 min    Short Term Goals: Week 2:  OT Short Term Goal 1 (Week 2): STGs=LTGs secondary to upcoming discharge  Skilled Therapeutic Interventions/Progress Updates:    Patient seated in recliner and ready for therapy session.  She demonstrates good recall of prior session and details of conversation.  SPT with RW to/from recliner, chair without arms CS/CG A.  Ambulation on unit to/from therapy gym space with RW CS/CGA.  She completed standing balance/reaching activities, weight shift, stepping activities, standing endurance tasks with CS/CG A.  She was able to steady herself with mild LOB during activity but continues with slowed reaction time on left side and fear of falling.  She returned to recliner with chair alarm set and call bell in place.  Therapy Documentation Precautions:  Precautions Precautions: Fall Precaution Comments: many falls with Sept neck fracture Restrictions Weight Bearing Restrictions: No General:   Vital Signs: Therapy Vitals Temp: 98.1 F (36.7 C) Temp Source: Oral Pulse Rate: 66 Resp: 19 BP: (!) 108/51 Patient Position (if appropriate): Lying Oxygen Therapy SpO2: 99 % O2 Device: Room Air Pain: Pain Assessment Pain Scale: 0-10 Pain Score: 0-No pain Pain Type: Acute pain Pain Location: Neck Pain Orientation: Right Pain Descriptors / Indicators: Aching Pain Onset: Gradual Patients Stated Pain Goal: 3 Pain Intervention(s): Medication (See eMAR)   Other Treatments:     Therapy/Group: Individual Therapy  Carlos Levering 06/12/2018, 3:38 PM

## 2018-06-13 LAB — GLUCOSE, CAPILLARY
Glucose-Capillary: 81 mg/dL (ref 70–99)
Glucose-Capillary: 87 mg/dL (ref 70–99)
Glucose-Capillary: 84 mg/dL (ref 70–99)
Glucose-Capillary: 234 mg/dL — ABNORMAL HIGH (ref 70–99)

## 2018-06-13 NOTE — Progress Notes (Signed)
Meredith Gonzalez is a 83 y.o. female admitted for CIR with functional deficits secondary to embolic right cortical infarct  Past Medical History:  Diagnosis Date  . Anemia   . Anxiety   . Arthritis    "all over" (02/11/2018)  . Basal cell carcinoma    "left leg" (02/11/2018)  . Cervical spine fracture ([place]) 12/2017   "C1-2"  . CHF (congestive heart failure) ([place])   . Chronic bronchitis ([place])   . Chronic neck pain    "since I broke my neck 6-8 wk ago" (02/11/2018)  . Diabetic peripheral neuropathy ([place])   . Fibromyalgia   . Fracture of right humerus   . Generalized weakness   . Headache    "weekly" (02/11/2018)  . History of blood transfusion    "related to one of my femur surgeries" (02/11/2018)  . History of echocardiogram    Echo 8/18: EF 50-55, no RWMA, Gr 1 DD, calcified AV leaflets, MAC, trivial MR, mod LAE, PASP 37  . Hypertension   . Hypothyroidism   . Incontinence of urine   . Ischemic stroke ([place]) 2000   "lost part of the vision in my right eye" (02/11/2018)  . Major depression, chronic   . Myocardial infarction ([place]) 06/2016  . Pacemaker   . Recurrent falls   . Syncope and collapse   . Type 2 diabetes, diet controlled (HCC)      Subjective: No new complaints. No new problems.  Alert and offering no complaints  Objective: Vital signs in last 24 hours: Temp:  [98 F (36.7 C)-98.4 F (36.9 C)] 98 F (36.7 C) (03/21 0435) Pulse Rate:  [66-68] 68 (03/21 0435) Resp:  [18-19] 18 (03/21 0435) BP: (108-150)/(51-68) 150/68 (03/21 0435) SpO2:  [96 %-99 %] 98 % (03/21 0833) Weight change:  Last BM Date: 06/10/18  Intake/Output from previous day: 03/20 0701 - 03/21 0700 In: 120 [P.O.:120] Out: -  Last cbgs: CBG (last 3)  Recent Labs    06/12/18 1656 06/12/18 2143 06/13/18 0633  GLUCAP 94 135* 81   Patient Vitals for the past 24 hrs:  BP Temp Temp src Pulse Resp SpO2  06/13/18 0833 - - - - - 98 %  06/13/18 0435 (!) 150/68 98 F (36.7 C) Oral 68 18 98  %  06/12/18 1925 - - - - - 96 %  06/12/18 1922 (!) 133/55 98.4 F (36.9 C) - 68 18 97 %  06/12/18 1346 (!) 108/51 98.1 F (36.7 C) Oral 66 19 99 %     Physical Exam General: No apparent distress   HEENT: not dry Lungs: Normal effort. Lungs clear to auscultation, no crackles or wheezes. Cardiovascular: Regular rate and rhythm, no edema Abdomen: S/NT/ND; BS(+) Musculoskeletal:  unchanged Neurological: No new neurological deficits with mild left-sided weakness Wounds: Right CEA scar healing nicely Skin: clear  Mental state: Alert, oriented, cooperative    Lab Results: BMET    Component Value Date/Time   NA 140 06/08/2018 0639   NA 144 05/19/2018 1612   K 4.5 06/08/2018 0639   CL 108 06/08/2018 0639   CO2 28 06/08/2018 0639   GLUCOSE 112 (H) 06/08/2018 0639   BUN 20 06/08/2018 0639   BUN 15 05/19/2018 1612   CREATININE 0.75 06/08/2018 0639   CALCIUM 8.5 (L) 06/08/2018 0639   GFRNONAA >60 06/08/2018 0639   GFRAA >60 06/08/2018 0639   CBC    Component Value Date/Time   WBC 6.6 06/11/2018 0522   RBC 2.94 (L) 06/11/2018 0522  HGB 8.7 (L) 06/11/2018 0522   HGB 10.2 (L) 05/19/2018 1612   HCT 30.1 (L) 06/11/2018 0522   HCT 32.2 (L) 05/19/2018 1612   PLT 248 06/11/2018 0522   PLT 286 05/19/2018 1612   MCV 102.4 (H) 06/11/2018 0522   MCV 91 05/19/2018 1612   MCH 29.6 06/11/2018 0522   MCHC 28.9 (L) 06/11/2018 0522   RDW 19.9 (H) 06/11/2018 0522   RDW 16.8 (H) 05/19/2018 1612   LYMPHSABS 1.0 06/06/2018 0638   MONOABS 0.7 06/06/2018 0638   EOSABS 0.0 06/06/2018 0638   BASOSABS 0.0 06/06/2018 1610    Medications: I have reviewed the patient's current medications.  Assessment/Plan:  Functional deficits secondary to embolic right cortical infarct.  Continue CIR Status post right carotid endarterectomy DVT prophylaxis.  Continue Eliquis Essential hypertension.  Blood pressure controlled off medications T2 DM.  Remains controlled History of atrial fibrillation  status post PPM continue amiodarone and Eliquis   Length of stay, days: 8  Marletta Lor , MD 06/13/2018, 11:12 AM

## 2018-06-14 ENCOUNTER — Inpatient Hospital Stay (HOSPITAL_COMMUNITY): Payer: Medicare Other | Admitting: Physical Therapy

## 2018-06-14 LAB — CBC
HCT: 30.7 % — ABNORMAL LOW (ref 36.0–46.0)
Hemoglobin: 9 g/dL — ABNORMAL LOW (ref 12.0–15.0)
MCH: 30.1 pg (ref 26.0–34.0)
MCHC: 29.3 g/dL — ABNORMAL LOW (ref 30.0–36.0)
MCV: 102.7 fL — ABNORMAL HIGH (ref 80.0–100.0)
NRBC: 0 % (ref 0.0–0.2)
Platelets: 249 10*3/uL (ref 150–400)
RBC: 2.99 MIL/uL — ABNORMAL LOW (ref 3.87–5.11)
RDW: 19.9 % — ABNORMAL HIGH (ref 11.5–15.5)
WBC: 5.7 10*3/uL (ref 4.0–10.5)

## 2018-06-14 LAB — GLUCOSE, CAPILLARY
GLUCOSE-CAPILLARY: 79 mg/dL (ref 70–99)
GLUCOSE-CAPILLARY: 102 mg/dL — AB (ref 70–99)
Glucose-Capillary: 111 mg/dL — ABNORMAL HIGH (ref 70–99)
Glucose-Capillary: 115 mg/dL — ABNORMAL HIGH (ref 70–99)

## 2018-06-14 NOTE — Progress Notes (Signed)
Meredith Gonzalez is a 83 y.o. female admitted for CIR with functional deficits secondary to embolic right cortical infarct  Past Medical History:  Diagnosis Date  . Anemia   . Anxiety   . Arthritis    "all over" (02/11/2018)  . Basal cell carcinoma    "left leg" (02/11/2018)  . Cervical spine fracture ([place]) 12/2017   "C1-2"  . CHF (congestive heart failure) ([place])   . Chronic bronchitis ([place])   . Chronic neck pain    "since I broke my neck 6-8 wk ago" (02/11/2018)  . Diabetic peripheral neuropathy ([place])   . Fibromyalgia   . Fracture of right humerus   . Generalized weakness   . Headache    "weekly" (02/11/2018)  . History of blood transfusion    "related to one of my femur surgeries" (02/11/2018)  . History of echocardiogram    Echo 8/18: EF 50-55, no RWMA, Gr 1 DD, calcified AV leaflets, MAC, trivial MR, mod LAE, PASP 37  . Hypertension   . Hypothyroidism   . Incontinence of urine   . Ischemic stroke ([place]) 2000   "lost part of the vision in my right eye" (02/11/2018)  . Major depression, chronic   . Myocardial infarction ([place]) 06/2016  . Pacemaker   . Recurrent falls   . Syncope and collapse   . Type 2 diabetes, diet controlled (HCC)      Subjective: No new complaints. No new problems.  Only complaint is mild headache this a.m. which is not unusual  Objective: Vital signs in last 24 hours: Temp:  [97.9 F (36.6 C)-98.5 F (36.9 C)] 98.1 F (36.7 C) (03/22 0431) Pulse Rate:  [66-79] 75 (03/22 0431) Resp:  [14-18] 14 (03/22 0431) BP: (101-146)/(62-65) 125/63 (03/22 0431) SpO2:  [91 %-97 %] 97 % (03/22 0812) Weight change:  Last BM Date: 06/11/18  Intake/Output from previous day: 03/21 0701 - 03/22 0700 In: 562 [P.O.:562] Out: -  Last cbgs: CBG (last 3)  Recent Labs    06/13/18 1151 06/13/18 1638 06/13/18 2116  GLUCAP 87 84 234*    Patient Vitals for the past 24 hrs:  BP Temp Temp src Pulse Resp SpO2  06/14/18 0812 - - - - - 97 %  06/14/18 0431  125/63 98.1 F (36.7 C) Oral 75 14 95 %  06/14/18 0150 (!) 146/65 97.9 F (36.6 C) Oral 79 18 91 %  06/13/18 1935 - - - - - 93 %  06/13/18 1933 104/62 98.4 F (36.9 C) - 66 17 95 %  06/13/18 1410 101/65 98.5 F (36.9 C) Oral 66 14 97 %    Physical Exam General: No apparent distress   HEENT: not dry Lungs: Normal effort. Lungs clear to auscultation, no crackles or wheezes. Cardiovascular: Regular rate and rhythm, no edema Abdomen: S/NT/ND; BS(+) Musculoskeletal:  unchanged Neurological: No new neurological deficits Wounds: Healing right CEA scar Skin: clear   Mental state: Alert, oriented, cooperative    Lab Results: BMET    Component Value Date/Time   NA 140 06/08/2018 0639   NA 144 05/19/2018 1612   K 4.5 06/08/2018 0639   CL 108 06/08/2018 0639   CO2 28 06/08/2018 0639   GLUCOSE 112 (H) 06/08/2018 0639   BUN 20 06/08/2018 0639   BUN 15 05/19/2018 1612   CREATININE 0.75 06/08/2018 0639   CALCIUM 8.5 (L) 06/08/2018 0639   GFRNONAA >60 06/08/2018 0639   GFRAA >60 06/08/2018 0639   CBC    Component Value Date/Time  WBC 5.7 06/14/2018 0554   RBC 2.99 (L) 06/14/2018 0554   HGB 9.0 (L) 06/14/2018 0554   HGB 10.2 (L) 05/19/2018 1612   HCT 30.7 (L) 06/14/2018 0554   HCT 32.2 (L) 05/19/2018 1612   PLT 249 06/14/2018 0554   PLT 286 05/19/2018 1612   MCV 102.7 (H) 06/14/2018 0554   MCV 91 05/19/2018 1612   MCH 30.1 06/14/2018 0554   MCHC 29.3 (L) 06/14/2018 0554   RDW 19.9 (H) 06/14/2018 0554   RDW 16.8 (H) 05/19/2018 1612   LYMPHSABS 1.0 06/06/2018 0638   MONOABS 0.7 06/06/2018 0638   EOSABS 0.0 06/06/2018 0638   BASOSABS 0.0 06/06/2018 4315    Medications: I have reviewed the patient's current medications.  Assessment/Plan:  Functional deficits secondary to embolic right cortical infarct.  Continue CIR Status post right carotid endarterectomy DVT prophylaxis continue Eliquis Essential hypertension.  Blood pressure remains well controlled off  medications T2 DM.  Continue to monitor History of atrial fibrillation.  Continue amiodarone and Eliquis    Length of stay, days: 9  Marletta Lor , MD 06/14/2018, 10:07 AM

## 2018-06-14 NOTE — Progress Notes (Signed)
Physical Therapy Session Note  Patient Details  Name: Meredith Gonzalez MRN: [id number] Date of Birth: 1935-12-14  Today's Date: 06/14/2018 PT Individual Time: 1300-1353 PT Individual Time Calculation (min): 53 min   Short Term Goals: Week 2:  PT Short Term Goal 1 (Week 2): STG=LTG due to ELOS  Skilled Therapeutic Interventions/Progress Updates: Pt presented in recliner agreeable to therapy. Pt denies pain but stating feeling a little "off" today. Pt performed STS from relciner requiring multiple attempts to stand if performed without assistance. Pt noted to have increased lateral and anterior sway when initially ambulating. Pt with poor foot clearance initially and reaching for objects. PTA instructed pt to sit at EOB and BP assessed, 140/84 pulse 84. Pt ambulated to rehab gym CGA with pt intermittently reaching forward and stating "I feel like I'm going to fall". Pt performed static balance at mat reaching for horseshoes with PTA increasing reach periodically with activity. Pt also participated in obstacle course weaving through cones decreasing space between cones with each bout then adding stepping over thresholds with cones. Pt noted at times to have RW further away from body placing pt in forward flexed posture. Discussed with staying within RW for improved support and stability. Pt ambulated back to room at end of session with CGA and improved posture and returned to recliner. Pt left with call bell within reach and needs met.      Therapy Documentation Precautions:  Precautions Precautions: Fall Precaution Comments: many falls with Sept neck fracture Restrictions Weight Bearing Restrictions: No General:   Vital Signs: Therapy Vitals Temp: 98.4 F (36.9 C) Temp Source: Oral Pulse Rate: 79 Resp: 16 BP: (!) 113/57 Patient Position (if appropriate): Sitting Oxygen Therapy SpO2: 97 % O2 Device: Room Air   Therapy/Group: Individual Therapy  Arali Somera  Ebony Yorio,  PTA  06/14/2018, 3:56 PM

## 2018-06-15 ENCOUNTER — Inpatient Hospital Stay (HOSPITAL_COMMUNITY): Payer: Medicare Other | Admitting: Occupational Therapy

## 2018-06-15 ENCOUNTER — Inpatient Hospital Stay (HOSPITAL_COMMUNITY): Payer: Medicare Other | Admitting: Physical Therapy

## 2018-06-15 LAB — BASIC METABOLIC PANEL
Anion gap: 7 (ref 5–15)
BUN: 20 mg/dL (ref 8–23)
CO2: 26 mmol/L (ref 22–32)
Calcium: 8.3 mg/dL — ABNORMAL LOW (ref 8.9–10.3)
Chloride: 109 mmol/L (ref 98–111)
Creatinine, Ser: 0.83 mg/dL (ref 0.44–1.00)
Glucose, Bld: 93 mg/dL (ref 70–99)
Potassium: 4.7 mmol/L (ref 3.5–5.1)
Sodium: 142 mmol/L (ref 135–145)

## 2018-06-15 LAB — GLUCOSE, CAPILLARY
Glucose-Capillary: 87 mg/dL (ref 70–99)
Glucose-Capillary: 104 mg/dL — ABNORMAL HIGH (ref 70–99)
Glucose-Capillary: 125 mg/dL — ABNORMAL HIGH (ref 70–99)
Glucose-Capillary: 111 mg/dL — ABNORMAL HIGH (ref 70–99)

## 2018-06-15 MED ORDER — SERTRALINE HCL 50 MG PO TABS
50.0000 mg | ORAL_TABLET | Freq: Every day | ORAL | Status: DC
  Administered 2018-06-15 – 2018-06-24 (×10): 50 mg via ORAL
  Filled 2018-06-15 (×10): qty 1

## 2018-06-15 NOTE — Progress Notes (Addendum)
[facility] PHYSICAL MEDICINE & REHABILITATION PROGRESS NOTE   Subjective/Complaints: Still with emotional lability, states she also gets upset about "little things" like wrong jelly on tray  ROS: Patient denies CP, SOB, N/V/D    Objective:   No results found. Recent Labs    06/14/18 0554  WBC 5.7  HGB 9.0*  HCT 30.7*  PLT 249   Recent Labs    06/15/18 0554  NA 142  K 4.7  CL 109  CO2 26  GLUCOSE 93  BUN 20  CREATININE 0.83  CALCIUM 8.3*    Intake/Output Summary (Last 24 hours) at 06/15/2018 0735 Last data filed at 06/14/2018 1821 Gross per 24 hour  Intake 459 ml  Output -  Net 459 ml     Physical Exam: Vital Signs Blood pressure (!) 116/59, pulse 68, temperature 97.9 F (36.6 C), resp. rate 17, height 5\' 5"  (1.651 m), weight 60.2 kg, SpO2 96 %. Constitutional: No distress . Vital signs reviewed. HEENT: EOMI, oral membranes moist Neck: supple Cardiovascular: RRR without murmur. No JVD    Respiratory: CTA Bilaterally without wheezes or rales. Normal effort    GI: BS +, non-tender, non-distended  Musculoskeletal:Normal range of motion.  Neurological: She isalertand oriented to person, place, and time. Sl ataxic speech.   LUE 4/5with impaired FMC. LLE grossly 4/5. RUE and RLE 5/5. No focal sensory findings are appreciated. .  +trunkal ataxia Skin: Skin iswarmand dry. She isnot diaphoretic.  Psychiatric: pleasant and cooperative , cries easily    Assessment/Plan: 1. Functional deficits secondary to embolic right cortical infarct which require 3+ hours per day of interdisciplinary therapy in a comprehensive inpatient rehab setting.  Physiatrist is providing close team supervision and 24 hour management of active medical problems listed below.  Physiatrist and rehab team continue to assess barriers to discharge/monitor patient progress toward functional and medical goals  Care Tool:  Bathing  Bathing activity did not occur: Refused(on toilet for  majority of eval) Body parts bathed by patient: Right arm, Left arm, Chest, Abdomen, Right upper leg, Left upper leg, Front perineal area, Buttocks, Face, Left lower leg, Right lower leg   Body parts bathed by helper: Left lower leg, Right lower leg     Bathing assist Assist Level: Supervision/Verbal cueing     Upper Body Dressing/Undressing Upper body dressing Upper body dressing/undressing activity did not occur (including orthotics): Refused(on toilet for majority of eval) What is the patient wearing?: Pull over shirt    Upper body assist Assist Level: Set up assist    Lower Body Dressing/Undressing Lower body dressing    Lower body dressing activity did not occur: Refused(on toilet for majority of eval) What is the patient wearing?: Underwear/pull up, Pants     Lower body assist Assist for lower body dressing: Minimal Assistance - Patient > 75%     Toileting Toileting    Toileting assist Assist for toileting: Contact Guard/Touching assist     Transfers Chair/bed transfer  Transfers assist     Chair/bed transfer assist level: Minimal Assistance - Patient > 75%     Locomotion Ambulation   Ambulation assist      Assist level: Supervision/Verbal cueing Assistive device: Walker-rolling Max distance: 171ft   Walk 10 feet activity   Assist     Assist level: Supervision/Verbal cueing Assistive device: Walker-rolling   Walk 50 feet activity   Assist    Assist level: Supervision/Verbal cueing Assistive device: Walker-rolling    Walk 150 feet activity   Assist Walk  150 feet activity did not occur: Safety/medical concerns  Assist level: Supervision/Verbal cueing Assistive device: Walker-rolling    Walk 10 feet on uneven surface  activity   Assist     Assist level: Minimal Assistance - Patient > 75% Assistive device: Aeronautical engineer   Type of Wheelchair: Manual    Wheelchair assist level:  Supervision/Verbal cueing Max wheelchair distance: 175ft    Wheelchair 50 feet with 2 turns activity    Assist        Assist Level: Supervision/Verbal cueing    Wheelchair 150 feet activity     Assist     Assist Level: Supervision/Verbal cueing    Medical Problem List and Plan: 1.Functional and mobility deficitssecondary to embolic right cortical infarct --Continue CIR therapies including PT, OT may be able to move up d/c date as fear of falling is subsiding  2. Antithrombotics: -DVT/anticoagulation:Pharmaceutical:Other (comment)--Eliquis -antiplatelet therapy: N/A 3.Chronic neck pain/Pain Management:On gabapentin tid with robaxin prn -no tramadol d/t intolerance  -added kpad prn 4. Mood:LCSW to follow for evaluation and support. -antipsychotic agents: N/A 5. Neuropsych: This patientiscapable of making decisions on herown behalf. 6. Skin/Wound Care:Routine pressure relief measures. 7. Fluids/Electrolytes/Nutrition:Monitor I/O.   I personally reviewed the patient's labs today.   -BUN trending up (26)---encourage fluids, held lasix I 342ml 8. HTN: Monitor BP bid  -had been on low dose lasix (hold see #7). Vitals:   06/14/18 2035 06/15/18 0431  BP:  (!) 116/59  Pulse:  68  Resp:  17  Temp:  97.9 F (36.6 C)  SpO2: 98% 96%  controlled 3/23 9. T2DMwith peripheral neuropathy:Hgb A1c- 5.7. Diet controlled.  -controlled 3/15 10 A fib s/p LEX:[id number] HR bid--continue amiodarone and Eliquis 11. DOE/Likely COPD: Recently started on Dulera.Resumed albuterol prn 12.R-CEA s/p CEA 3/11: monitor wound and dysphagia 13. Anemia: stable   -no gross blood loss  -recheck next week 14.  Emotional lability post CVA - trial sertraline 25mg  LOS: 10 days A FACE TO FACE EVALUATION WAS PERFORMED  Charlett Blake 06/15/2018, 7:35 AM

## 2018-06-15 NOTE — Progress Notes (Signed)
Occupational Therapy Session Note  Patient Details  Name: Meredith Gonzalez MRN: [id number] Date of Birth: October 22, 1935  Today's Date: 06/15/2018 OT Individual Time: 1230-1300 OT Individual Time Calculation (min): 30 min    Short Term Goals: Week 1:  OT Short Term Goal 1 (Week 1): Pt will complete toilet transfers with Supervision OT Short Term Goal 1 - Progress (Week 1): Met OT Short Term Goal 2 (Week 1): Pt will complete 2 grooming tasks in standing to demonstrate increased activity tolerance OT Short Term Goal 2 - Progress (Week 1): Met OT Short Term Goal 3 (Week 1): Pt will utilize LUE at diminished level during self-care tasks with min cues OT Short Term Goal 3 - Progress (Week 1): Met OT Short Term Goal 4 (Week 1): Pt will complete simple meal prep activity with supervision OT Short Term Goal 4 - Progress (Week 1): Met  Skilled Therapeutic Interventions/Progress Updates:    Treatment session with focus on functional mobility and safety during home making tasks.  Pt received upright in recliner agreeable to therapy session.  Ambulated 200' with RW with supervision.  Engaged in reaching activity in ADL apt kitchen with focus on weight shifting, dynamic standing balance, and functional use of LUE to retreive items from [employer].  Close supervision when reaching across midline.  Pt with no LOB during activity.  Returned to room as above with one near LOB, however pt able to recover without assistance.  Pt left upright in recliner with all needs in reach.  Therapy Documentation Precautions:  Precautions Precautions: Fall Precaution Comments: many falls with Sept neck fracture Restrictions Weight Bearing Restrictions: No General:   Vital Signs: Therapy Vitals Temp: 98.3 F (36.8 C) Pulse Rate: 66 Resp: 19 BP: 116/62 Patient Position (if appropriate): Sitting Oxygen Therapy SpO2: 100 % O2 Device: Room Air Pain: Pain Assessment Pain Scale: 0-10 Pain Score: 4  Pain Type:  Chronic pain Pain Location: Neck premedicated   Therapy/Group: Individual Therapy  Simonne Come 06/15/2018, 3:19 PM

## 2018-06-15 NOTE — Progress Notes (Signed)
Occupational Therapy Session Note  Patient Details  Name: Meredith Gonzalez MRN: [id number] Date of Birth: [birth year]  Today's Date: 06/15/2018 OT Individual Time: 0935-1030 OT Individual Time Calculation (min): 55 min    Short Term Goals: Week 2:  OT Short Term Goal 1 (Week 2): STGs=LTGs secondary to upcoming discharge  Skilled Therapeutic Interventions/Progress Updates:    Pt received in recliner dressed and ready for the day.  She declined a shower.  Pt wanted to focus on her L leg strength and balance.  She stood with S to RW and ambulated over 150 ft to dayroom with RW with close S to [place].    Pt transferred onto Cybex to work from seated with setting of 70. Pt felt this was too easy so incrementally increased setting to 30 for more resistance.  Pt worked on pushing through her legs for 2 -3 min at a time, 3 cycles.   Transitioned to standing.  Initially had setting of 60 for lighter resistance but pt was taking large steps and it was irritating her knees.  Cybex setting CM 30 in standing with small excursion steps did not bother her and pt able to do a smooth weigh shift for 2 min at a time, 4 sets.  Pt resting between sets.  Pt then transferred to an arm chair and then walked back to room to return to recliner to rest.  Pt sitting on chair pad alarm with all needs met.     Therapy Documentation Precautions:  Precautions Precautions: Fall Precaution Comments: many falls with Sept neck fracture Restrictions Weight Bearing Restrictions: No    Vital Signs: Oxygen Therapy SpO2: 94 % O2 Device: Room Air Pain: Pain Assessment Pain Scale: 0-10 Pain Score: 5  Pain Type: Chronic pain Pain Location: Head Pain Orientation: Right;Anterior Pain Radiating Towards: Neck Pain Descriptors / Indicators: Aching Pain Frequency: Constant Pain Intervention(s): Emotional support;Relaxation   Therapy/Group: Individual Therapy  [place] 06/15/2018, 11:15 AM

## 2018-06-15 NOTE — Progress Notes (Signed)
Occupational Therapy Session Note  Patient Details  Name: Meredith Gonzalez MRN: [id number] Date of Birth: Oct 16, 1935  Today's Date: 06/15/2018 OT Individual Time: 4163-8453 OT Individual Time Calculation (min): 47 min    Short Term Goals: Week 2:  OT Short Term Goal 1 (Week 2): STGs=LTGs secondary to upcoming discharge  Skilled Therapeutic Interventions/Progress Updates:    Pt seen for OT session focusing on functional ambulation, standing balance and endurance. Pt sitting up in recliner upon arrival, agreeable to tx session and denying pain. Pt desiring to work on balance this session.  She ambulated to therapy gym using RW with supervision and VCs for posture and less UE reliance on RW. Completed dynamic standing activity standing on foam wedge for LE strengthening and stretch. HHA while pt tossed horseshoes. She then ambulated with RW and retrieved horseshoes using reacher with CGA and VCs for safety awareness and positioning. Completed x2 trials with seated rest breaks btwn trials. Pt desiring to attempt ambulation with rollator which she used PTA. Completed ambulation throughout unit with supervision using rollator. Pt voiced feeling she did not have control of rollator. 8# weight added to rollator, however, pt cont to not feel in control. Recommend to cont to use RW at this time, will re-assess at future date.  Pt returned to room at end of session using RW with supervision. Pt left seated in recliner with all needs in reach.   Therapy Documentation Precautions:  Precautions Precautions: Fall Precaution Comments: many falls with Sept neck fracture Restrictions Weight Bearing Restrictions: No   Therapy/Group: Individual Therapy  Tyger Wichman L 06/15/2018, 7:10 AM

## 2018-06-15 NOTE — Progress Notes (Signed)
Physical Therapy Session Note  Patient Details  Name: Meredith Gonzalez MRN: [id number] Date of Birth: [birth year]  Today's Date: 06/15/2018 PT Individual Time: 0803-0900 PT Individual Time Calculation (min): 57 min   Short Term Goals: Week 2:  PT Short Term Goal 1 (Week 2): STG=LTG due to ELOS  Skilled Therapeutic Interventions/Progress Updates: Pt presented in bed agreeable to therapy. Pt performed bed mobilty with supervision and use of features. Pt was able to thread pants and don shirt with supervision.PTA donned socks as pt states uses sock aide at home.  Performed STS with supervision to complete LB clothing management. Pt ambulated to rehab gym CGA fading to supervision and pt indicated that she felt better this am compared to yesterday. Pt participated in peg board activity while standing on Airex. Pt required x 2 cues to align pegs correctly and x 2 cues not to reach for table. After peg activity pt performed alternating shoudler flexion and eyes closed x 30 sec ea with pt demonstrating improving ankle strategy with activity. Pt also participated in agility ladder step overs and lateral steps without AD to promote step length. Pt then participated in Biodex LOS x 2 trials with UE support and x 2 trials without. Pt was able to improve ankle strategy with activity and scored 60% on last trial without UE support. Pt ambulated back to room at end of session close S and returned to recliner. Pt left with call bell within reach, belt alarm on, and needs met.      Therapy Documentation Precautions:  Precautions Precautions: Fall Precaution Comments: many falls with Sept neck fracture Restrictions Weight Bearing Restrictions: No General:   Vital Signs: Therapy Vitals Temp: 98.3 F (36.8 C) Pulse Rate: 66 Resp: 19 BP: 116/62 Patient Position (if appropriate): Sitting Oxygen Therapy SpO2: 100 % O2 Device: Room Air Pain: Pain Assessment Pain Score: 4  Faces Pain Scale: Hurts little  more PAINAD (Pain Assessment in Advanced Dementia) Breathing: normal Negative Vocalization: none Facial Expression: smiling or inexpressive Body Language: relaxed Consolability: no need to console PAINAD Score: 0  Therapy/Group: Individual Therapy  Maveryk Renstrom  Wladyslaw Henrichs, PTA  06/15/2018, 4:28 PM

## 2018-06-15 NOTE — Plan of Care (Signed)
  Problem: Consults Goal: RH GENERAL PATIENT EDUCATION Description See Patient Education module for education specifics. Outcome: Progressing   Problem: RH SKIN INTEGRITY Goal: RH STG SKIN FREE OF INFECTION/BREAKDOWN Description No new breakdown with min assist   Outcome: Progressing   Problem: RH SAFETY Goal: RH STG ADHERE TO SAFETY PRECAUTIONS W/ASSISTANCE/DEVICE Description STG Adhere to Safety Precautions With Min Assistance/Device.  Outcome: Progressing   Problem: RH PAIN MANAGEMENT Goal: RH STG PAIN MANAGED AT OR BELOW PT'S PAIN GOAL Description <3 out of 10.   Outcome: Not Progressing

## 2018-06-16 ENCOUNTER — Inpatient Hospital Stay (HOSPITAL_COMMUNITY): Payer: Medicare Other | Admitting: Physical Therapy

## 2018-06-16 ENCOUNTER — Inpatient Hospital Stay (HOSPITAL_COMMUNITY): Payer: Medicare Other | Admitting: Occupational Therapy

## 2018-06-16 DIAGNOSIS — I639 Cerebral infarction, unspecified: Secondary | ICD-10-CM

## 2018-06-16 LAB — GLUCOSE, CAPILLARY
GLUCOSE-CAPILLARY: 89 mg/dL (ref 70–99)
Glucose-Capillary: 92 mg/dL (ref 70–99)
Glucose-Capillary: 93 mg/dL (ref 70–99)
Glucose-Capillary: 126 mg/dL — ABNORMAL HIGH (ref 70–99)

## 2018-06-16 NOTE — Progress Notes (Signed)
Occupational Therapy Session Note  Patient Details  Name: Meredith Gonzalez MRN: [id number] Date of Birth: November 04, 1935  Today's Date: 06/16/2018 OT Individual Time: 1430-1500 OT Individual Time Calculation (min): 30 min    Short Term Goals: Week 2:  OT Short Term Goal 1 (Week 2): STGs=LTGs secondary to upcoming discharge  Skilled Therapeutic Interventions/Progress Updates:    Pt seen for OT session focusing on functional ambulation and activity tolerance. Pt sitting up in recliner upon arrival, denying pain and agreeable to tx session. Pt reports home bathroom has entry level shower to shower seat and therefore declined need to practice shower transfer.  Pt ambulated to therapy gym with supervision using RW with supervision. In therapy gym, completed dynamic standing task, tossing ball against trampoline using B UEs, maintaining balance with min A. Pt tolerating ~3 minutes in standing before requiring seated rest break. Completed x2 trials. Pt ambulated back to room with min HHA from therapist. One standing rest break required. Pt left seated in recliner at end of session, all needs in reach.   Therapy Documentation Precautions:  Precautions Precautions: Fall Precaution Comments: many falls with Sept neck fracture Restrictions Weight Bearing Restrictions: No   Therapy/Group: Individual Therapy  Jennylee Uehara L 06/16/2018, 7:01 AM

## 2018-06-16 NOTE — Progress Notes (Signed)
Physical Therapy Session Note  Patient Details  Name: Meredith Gonzalez MRN: [id number] Date of Birth: 11-Nov-1935  Today's Date: 06/16/2018 PT Individual Time: 3419-6222 PT Individual Time Calculation (min): 24 min   Short Term Goals: Week 2:  PT Short Term Goal 1 (Week 2): STG=LTG due to ELOS  Skilled Therapeutic Interventions/Progress Updates:  Pt received in recliner & agreeable to tx. Pt denied c/o pain. Pt transfers sit<>Stand with and without armrests with supervision. Pt ambulates room<>gym with RW & supervision. Pt utilized [employer] Limits of Stability with BUE>RUE>no UE support with task focusing on weight shifting in all directions with pt demonstrating improving ability to shift anteriorly.  Pt negotiated 8 steps (6") with B rails and supervision for BLE strengthening. At end of session pt left sitting in recliner with alarm set & needs at hand.   Therapy Documentation Precautions:  Precautions Precautions: Fall Precaution Comments: many falls with Sept neck fracture Restrictions Weight Bearing Restrictions: No    Therapy/Group: Individual Therapy  [name] 06/16/2018, 1:35 PM

## 2018-06-16 NOTE — Progress Notes (Signed)
[place] PHYSICAL MEDICINE & REHABILITATION PROGRESS NOTE   Subjective/Complaints:  Pt ambulating with PT, tired but no SOB, no cough  ROS: Patient denies CP, SOB, N/V/D    Objective:   No results found. Recent Labs    06/14/18 0554  WBC 5.7  HGB 9.0*  HCT 30.7*  PLT 249   Recent Labs    06/15/18 0554  NA 142  K 4.7  CL 109  CO2 26  GLUCOSE 93  BUN 20  CREATININE 0.83  CALCIUM 8.3*    Intake/Output Summary (Last 24 hours) at 06/16/2018 0854 Last data filed at 06/15/2018 1700 Gross per 24 hour  Intake 240 ml  Output -  Net 240 ml     Physical Exam: Vital Signs Blood pressure (!) 145/88, pulse 90, temperature 98 F (36.7 C), resp. rate 17, height 5\' 5"  (1.651 m), weight 60.2 kg, SpO2 94 %. Constitutional: No distress . Vital signs reviewed. HEENT: EOMI, oral membranes moist Neck: supple Cardiovascular: RRR without murmur. No JVD    Respiratory: CTA Bilaterally without wheezes or rales. Normal effort    GI: BS +, non-tender, non-distended  Musculoskeletal:Normal range of motion.  Neurological: She isalertand oriented to person, place, and time. Sl ataxic speech.   LUE 4/5with impaired FMC. LLE grossly 4/5. RUE and RLE 5/5. No focal sensory findings are appreciated. .  +trunkal ataxia Mild dysmetria LLE Heel to shin  Skin: Skin iswarmand dry. She isnot diaphoretic.  Psychiatric: pleasant and cooperative ,     Assessment/Plan: 1. Functional deficits secondary to embolic right cortical infarct which require 3+ hours per day of interdisciplinary therapy in a comprehensive inpatient rehab setting.  Physiatrist is providing close team supervision and 24 hour management of active medical problems listed below.  Physiatrist and rehab team continue to assess barriers to discharge/monitor patient progress toward functional and medical goals  Care Tool:  Bathing  Bathing activity did not occur: Refused(on toilet for majority of eval) Body parts  bathed by patient: Right arm, Left arm, Chest, Abdomen, Right upper leg, Left upper leg, Front perineal area, Buttocks, Face, Left lower leg, Right lower leg   Body parts bathed by helper: Left lower leg, Right lower leg     Bathing assist Assist Level: Supervision/Verbal cueing     Upper Body Dressing/Undressing Upper body dressing Upper body dressing/undressing activity did not occur (including orthotics): Refused(on toilet for majority of eval) What is the patient wearing?: Pull over shirt    Upper body assist Assist Level: Set up assist    Lower Body Dressing/Undressing Lower body dressing    Lower body dressing activity did not occur: Refused(on toilet for majority of eval) What is the patient wearing?: Underwear/pull up, Pants     Lower body assist Assist for lower body dressing: Minimal Assistance - Patient > 75%     Toileting Toileting    Toileting assist Assist for toileting: Minimal Assistance - Patient > 75%     Transfers Chair/bed transfer  Transfers assist     Chair/bed transfer assist level: Contact Guard/Touching assist     Locomotion Ambulation   Ambulation assist      Assist level: Supervision/Verbal cueing Assistive device: Walker-rolling Max distance: 131ft   Walk 10 feet activity   Assist     Assist level: Supervision/Verbal cueing Assistive device: Walker-rolling   Walk 50 feet activity   Assist    Assist level: Supervision/Verbal cueing Assistive device: Walker-rolling    Walk 150 feet activity   Assist Walk  150 feet activity did not occur: Safety/medical concerns  Assist level: Supervision/Verbal cueing Assistive device: Walker-rolling    Walk 10 feet on uneven surface  activity   Assist     Assist level: Minimal Assistance - Patient > 75% Assistive device: Aeronautical engineer   Type of Wheelchair: Manual    Wheelchair assist level: Supervision/Verbal cueing Max wheelchair  distance: 121ft    Wheelchair 50 feet with 2 turns activity    Assist        Assist Level: Supervision/Verbal cueing    Wheelchair 150 feet activity     Assist     Assist Level: Supervision/Verbal cueing    Medical Problem List and Plan: 1.Functional and mobility deficitssecondary to embolic right cortical infarct --Continue CIR therapies team conference in am  2. Antithrombotics: -DVT/anticoagulation:Pharmaceutical:Other (comment)--Eliquis -antiplatelet therapy: N/A 3.Chronic neck pain/Pain Management:On gabapentin tid with robaxin prn -no tramadol d/t intolerance  -added kpad prn 4. Mood:LCSW to follow for evaluation and support. -antipsychotic agents: N/A 5. Neuropsych: This patientiscapable of making decisions on herown behalf. 6. Skin/Wound Care:Routine pressure relief measures. 7. Fluids/Electrolytes/Nutrition:Monitor I/O.   I personally reviewed the patient's labs today.   -BUN trending up (26)---encourage fluids, held lasix I 312ml 8. HTN: Monitor BP bid  -had been on low dose lasix (hold see #7). Vitals:   06/15/18 2225 06/16/18 0300  BP:  (!) 145/88  Pulse:  90  Resp:  17  Temp:  98 F (36.7 C)  SpO2: 94%   controlled 3/24 9. T2DMwith peripheral neuropathy:Hgb A1c- 5.7. Diet controlled.  -controlled 3/15 10 A fib s/p HWK:GSUPJSR HR bid--continue amiodarone and Eliquis 11. DOE/Likely COPD: Recently started on Dulera.Resumed albuterol prn 12.R-CEA s/p CEA 3/11: monitor wound and dysphagia 13. Anemia: stable   -no gross blood loss  -recheck next week 14.  Emotional lability post CVA - trial sertraline 25mg  LOS: 11 days A FACE TO FACE EVALUATION WAS PERFORMED  Charlett Blake 06/16/2018, 8:54 AM

## 2018-06-16 NOTE — Plan of Care (Signed)
  Problem: Consults Goal: RH GENERAL PATIENT EDUCATION Description See Patient Education module for education specifics. Outcome: Progressing   Problem: RH SKIN INTEGRITY Goal: RH STG SKIN FREE OF INFECTION/BREAKDOWN Description No new breakdown with min assist   Outcome: Progressing Goal: RH STG ABLE TO PERFORM INCISION/WOUND CARE W/ASSISTANCE Description STG Able To Perform Incision/Wound Care With Min Assistance.  Outcome: Progressing   Problem: RH SAFETY Goal: RH STG ADHERE TO SAFETY PRECAUTIONS W/ASSISTANCE/DEVICE Description STG Adhere to Safety Precautions With Min Assistance/Device.  Outcome: Progressing   Problem: RH PAIN MANAGEMENT Goal: RH STG PAIN MANAGED AT OR BELOW PT'S PAIN GOAL Description <3 out of 10.   Outcome: Progressing   

## 2018-06-16 NOTE — Progress Notes (Signed)
Physical Therapy Session Note  Patient Details  Name: Meredith Gonzalez MRN: [id number] Date of Birth: August 23, 1935  Today's Date: 06/16/2018 PT Individual Time: 0845-1000 PT Individual Time Calculation (min): 75 min   Short Term Goals: Week 2:  PT Short Term Goal 1 (Week 2): STG=LTG due to ELOS  Skilled Therapeutic Interventions/Progress Updates: Pt presented in w/c agreeable to therapy. Pt ambulated to rehab gym supervision level with RW. Participated in [employer] with improved score or 32/56 which continues to be indicative of hight fall risk (see details below). Pt then participated in balance activities including toe taps to 4in step with pt demonstrating some improving ankle strategies. Pt also performed stepping over walking stick L/R and steps to target outside BOS. Pt intermittently using arms to reach out when feeling unsupported. Pt ambulated to day room supervision and participated in NuStep L3 x 6 min for global conditioning. Pt returned to room at end of session     Therapy Documentation Precautions:  Precautions Precautions: Fall Precaution Comments: many falls with Sept neck fracture Restrictions Weight Bearing Restrictions: No General:   Vital Signs:  Pain: Pain Assessment Pain Score: 0-No pain Mobility:   Locomotion :    Trunk/Postural Assessment :    Balance:   Exercises:   Other Treatments:      Therapy/Group: Individual Therapy  Gearold Wainer 06/16/2018, 4:12 PM

## 2018-06-16 NOTE — Progress Notes (Signed)
Occupational Therapy Session Note  Patient Details  Name: Meredith Gonzalez MRN: [id number] Date of Birth: [birth year]  Today's Date: 06/16/2018 OT Individual Time: 1015-1130 OT Individual Time Calculation (min): 75 min    Short Term Goals: Week 2:  OT Short Term Goal 1 (Week 2): STGs=LTGs secondary to upcoming discharge  Skilled Therapeutic Interventions/Progress Updates:    Pt received in recliner, she stated she washed up and dressed for the day already and wanted to focus on her balance today.  Pt ambulated to the gym with her RW with close S to occasional CGA.  Halfway to the gym she stopped to give her meal orders to the dietary aide and stood with S for 8 minutes with RW.  In gym pt worked on a variety of balance challenges: -Standing on foam block for ant/posterior and lateral weight shifts -Balance reaction time with stepping a foot forward or back based on how she was "pushed" with small steps followed by larger steps -single leg exercises with UE support (leg abd, hip flexor, heel raises, hamstring curl) - forward lunging - side stepping - squats - ant/ posterior weight shift with legs in staggered stance.  Pt did very well with all of the exercises and is getting stronger and improved balance reaction times.   Pt ambulated back to room to sit in recliner. Chair pad alarm set.  All needs met.  Therapy Documentation Precautions:  Precautions Precautions: Fall Precaution Comments: many falls with Sept neck fracture Restrictions Weight Bearing Restrictions: No  Pain: Pain Assessment Pain Score: 0-No pain     Therapy/Group: Individual Therapy  [place] 06/16/2018, 12:37 PM

## 2018-06-17 ENCOUNTER — Inpatient Hospital Stay (HOSPITAL_COMMUNITY): Payer: Medicare Other | Admitting: Physical Therapy

## 2018-06-17 ENCOUNTER — Inpatient Hospital Stay (HOSPITAL_COMMUNITY): Payer: Medicare Other

## 2018-06-17 ENCOUNTER — Ambulatory Visit: Payer: Medicare Other | Admitting: Pulmonary Disease

## 2018-06-17 ENCOUNTER — Inpatient Hospital Stay (HOSPITAL_COMMUNITY): Payer: Medicare Other | Admitting: Occupational Therapy

## 2018-06-17 LAB — CBC
HCT: 30 % — ABNORMAL LOW (ref 36.0–46.0)
Hemoglobin: 9.1 g/dL — ABNORMAL LOW (ref 12.0–15.0)
MCH: 31.5 pg (ref 26.0–34.0)
MCHC: 30.3 g/dL (ref 30.0–36.0)
MCV: 103.8 fL — ABNORMAL HIGH (ref 80.0–100.0)
Platelets: 230 10*3/uL (ref 150–400)
RBC: 2.89 MIL/uL — ABNORMAL LOW (ref 3.87–5.11)
RDW: 19.6 % — ABNORMAL HIGH (ref 11.5–15.5)
WBC: 6.2 10*3/uL (ref 4.0–10.5)
nRBC: 0 % (ref 0.0–0.2)

## 2018-06-17 LAB — GLUCOSE, CAPILLARY
Glucose-Capillary: 86 mg/dL (ref 70–99)
Glucose-Capillary: 92 mg/dL (ref 70–99)
Glucose-Capillary: 133 mg/dL — ABNORMAL HIGH (ref 70–99)
Glucose-Capillary: 164 mg/dL — ABNORMAL HIGH (ref 70–99)

## 2018-06-17 MED ORDER — KETOROLAC TROMETHAMINE 10 MG PO TABS
10.0000 mg | ORAL_TABLET | Freq: Three times a day (TID) | ORAL | Status: DC | PRN
  Filled 2018-06-17: qty 1

## 2018-06-17 NOTE — Progress Notes (Signed)
PHYSICAL MEDICINE & REHABILITATION PROGRESS NOTE   Subjective/Complaints:  C/o headache pain in back of skull, hx of C1-2 fx last fall treated conservatively.  No arm or leg weakness or numbess No visual changes , no sweats or chills Pain increased with head movement, pt refused therapy today Reviewed CT head as well as CTA neck.  Evidence of left sided facet arthropathy Pain unresponsive to tylenol  ROS: Patient denies CP, SOB, N/V/D    Objective:   Ct Head Wo Contrast  Result Date: 06/17/2018 CLINICAL DATA:  Bilateral occipital headaches radiating to frontal regions EXAM: CT HEAD WITHOUT CONTRAST TECHNIQUE: Contiguous axial images were obtained from the base of the skull through the vertex without intravenous contrast. COMPARISON:  May 31, 2018 FINDINGS: Brain: Mild diffuse atrophy is stable. There is no intracranial mass, hemorrhage, extra-axial fluid collection, or midline shift. There is small vessel disease in the centra semiovale bilaterally, stable. Small vessel disease is also noted in each lateral thalamus and in the anterior limbs of the internal and external capsules bilaterally, stable. No acute appearing infarct is evident on this study. Vascular: There is no appreciable hyperdense vessel. There is calcification in each carotid siphon region. Skull: The bony calvarium appears intact. Sinuses/Orbits: There is mucosal thickening in several ethmoid air cells. Other visualized paranasal sinuses are clear. Visualized orbits appear symmetric bilaterally. Other: Mastoid air cells are clear. IMPRESSION: Stable atrophy with supratentorial small vessel disease. No acute infarct evident. No mass or hemorrhage. There are foci of arterial vascular calcification. There is mucosal thickening in several ethmoid air cells. Electronically Signed   By: Lowella Grip III M.D.   On: 06/17/2018 07:15   Recent Labs    06/17/18 0548  WBC 6.2  HGB 9.1*  HCT 30.0*  PLT 230   Recent  Labs    06/15/18 0554  NA 142  K 4.7  CL 109  CO2 26  GLUCOSE 93  BUN 20  CREATININE 0.83  CALCIUM 8.3*    Intake/Output Summary (Last 24 hours) at 06/17/2018 0827 Last data filed at 06/16/2018 1756 Gross per 24 hour  Intake 480 ml  Output -  Net 480 ml     Physical Exam: Vital Signs Blood pressure (!) 116/51, pulse 72, temperature 98.1 F (36.7 C), resp. rate 18, height '5\' 5"'  (1.651 m), weight 62.3 kg, SpO2 94 %. Constitutional: No distress . Vital signs reviewed. HEENT: EOMI, oral membranes moist Neck: tender in the left suboccipital area Cardiovascular: RRR without murmur. No JVD    Respiratory: CTA Bilaterally without wheezes or rales. Normal effort    GI: BS +, non-tender, non-distended  Musculoskeletal:Normal range of motion.  Neurological: She isalertand oriented to person, place, and time. Sl ataxic speech.   LUE 4/5with impaired FMC. LLE grossly 4/5. RUE and RLE 5/5. No focal sensory findings are appreciated. .  +trunkal ataxia Mild dysmetria LLE Heel to shin  Skin: Skin iswarmand dry. She isnot diaphoretic.  Psychiatric: pleasant and cooperative ,     Assessment/Plan: 1. Functional deficits secondary to embolic right cortical infarct which require 3+ hours per day of interdisciplinary therapy in a comprehensive inpatient rehab setting.  Physiatrist is providing close team supervision and 24 hour management of active medical problems listed below.  Physiatrist and rehab team continue to assess barriers to discharge/monitor patient progress toward functional and medical goals  Care Tool:  Bathing  Bathing activity did not occur: Refused(on toilet for majority of eval) Body parts bathed by patient: Right  arm, Left arm, Chest, Abdomen, Right upper leg, Left upper leg, Front perineal area, Buttocks, Face, Left lower leg, Right lower leg   Body parts bathed by helper: Left lower leg, Right lower leg     Bathing assist Assist Level: Supervision/Verbal  cueing     Upper Body Dressing/Undressing Upper body dressing Upper body dressing/undressing activity did not occur (including orthotics): Refused(on toilet for majority of eval) What is the patient wearing?: Pull over shirt    Upper body assist Assist Level: Set up assist    Lower Body Dressing/Undressing Lower body dressing    Lower body dressing activity did not occur: Refused(on toilet for majority of eval) What is the patient wearing?: Underwear/pull up, Pants     Lower body assist Assist for lower body dressing: Minimal Assistance - Patient > 75%     Toileting Toileting    Toileting assist Assist for toileting: Contact Guard/Touching assist     Transfers Chair/bed transfer  Transfers assist     Chair/bed transfer assist level: Contact Guard/Touching assist     Locomotion Ambulation   Ambulation assist      Assist level: Supervision/Verbal cueing Assistive device: Walker-rolling Max distance: 150 ft    Walk 10 feet activity   Assist     Assist level: Supervision/Verbal cueing Assistive device: Walker-rolling   Walk 50 feet activity   Assist    Assist level: Supervision/Verbal cueing Assistive device: Walker-rolling    Walk 150 feet activity   Assist Walk 150 feet activity did not occur: Safety/medical concerns  Assist level: Supervision/Verbal cueing Assistive device: Walker-rolling    Walk 10 feet on uneven surface  activity   Assist     Assist level: Minimal Assistance - Patient > 75% Assistive device: Aeronautical engineer   Type of Wheelchair: Manual    Wheelchair assist level: Supervision/Verbal cueing Max wheelchair distance: 198f    Wheelchair 50 feet with 2 turns activity    Assist        Assist Level: Supervision/Verbal cueing    Wheelchair 150 feet activity     Assist     Assist Level: Supervision/Verbal cueing    Medical Problem List and Plan: 1.Functional and  mobility deficitssecondary to embolic right cortical infarct --Continue CIR, Team conference today please see physician documentation under team conference tab, met with team face-to-face to discuss problems,progress, and goals. Formulized individual treatment plan based on medical history, underlying problem and comorbidities.  2. Antithrombotics: -DVT/anticoagulation:Pharmaceutical:Other (comment)--Eliquis -antiplatelet therapy: N/A 3.Chronic neck pain/Pain Management:On gabapentin tid with robaxin prn -no tramadol d/t intolerance  -added kpad prn 4. Mood:LCSW to follow for evaluation and support. -antipsychotic agents: N/A 5. Neuropsych: This patientiscapable of making decisions on herown behalf. 6. Skin/Wound Care:Routine pressure relief measures. 7. Fluids/Electrolytes/Nutrition:Monitor I/O.   I personally reviewed the patient's labs today.   -BUN trending up (26)---encourage fluids, held lasix I 7232m poor appetite today due to pain 8. HTN: Monitor BP bid  -had been on low dose lasix (hold see #7). Vitals:   06/17/18 0522 06/17/18 0805  BP: (!) 116/51   Pulse: 72   Resp: 18   Temp: 98.1 F (36.7 C)   SpO2: 95% 94%  controlled 3/25 9. T2DMwith peripheral neuropathy:Hgb A1c- 5.7. Diet controlled.  -controlled 3/15 10 A fib s/p PPAYT:[id number] bid--continue amiodarone and Eliquis 11. DOE/Likely COPD: Recently started on Dulera.Resumed albuterol prn 12.R-CEA s/p CEA 3/11: monitor wound and dysphagia 13. Anemia: stable   -no gross blood  loss  -recheck next week 14.  Emotional lability post CVA - trial sertraline 24m 15.  C spine pain upper left facet most likely pain generator as noted on CTA neck ~3wk ago no new falls or trauma, no fever no WBC elevation. No radic or myelopathic finding on exam Check spine xrays, encourage use of robaxin, very sensitive to narcotics so will avoid, consider one time dose  of IM toradol if no improvement LOS: 12 days A FACE TO FACE EVALUATION WAS PERFORMED  ACharlett Blake3/25/2020, 8:27 AM

## 2018-06-17 NOTE — Progress Notes (Addendum)
Patient called & stated that she still has a headache. A few hours ago, patient c/o a headache & was given tylenol as per prn order. This was the 2nd c/o headache during the shift. She states that it's the worst headache that she has ever felt & that it is located at the base of her skull on the left side of her neck.She also said that her headaches are usually on the other side. It has not been 4 hours since she had the last dose of tylenol & her headache is more frequent than reported by previous shift. Vitals were taken & were WNL. No c/o nausea, vision abnormalities, or dizziness. On call provider was called & a stat CT scan was ordered. Patient was informed of order for diagnostic testing. No respiratory distress noted.

## 2018-06-17 NOTE — Progress Notes (Signed)
Social Work Patient ID: Meredith Gonzalez, female   DOB: [birth year], 83 y.o.   MRN: [id number] Met with pt to see how feeling and inform team conference progress toward her goals. She is feeling much better now she was having intense pain in her neck. MD did a CT and it was normal. Discharge date still 4/1 to reach her mod/i level goals. Informed her to not let the therapists push her too hard in therapies. She will not will work toward her discharge date 4/1 and resume her home health services.

## 2018-06-17 NOTE — Patient Care Conference (Signed)
Inpatient RehabilitationTeam Conference and Plan of Care Update Date: 06/17/2018   Time: 10:55 AM    Patient Name: Meredith Gonzalez      Medical Record Number: [id number]  Date of Birth: 1935-06-15 Sex: Female         Room/Bed: 4W18C/4W18C-01 Payor Info: Payor: MEDICARE / Plan: MEDICARE PART A AND B / Product Type: *No Product type* /    Admitting Diagnosis: cva  Admit Date/Time:  06/05/2018  4:05 PM Admission Comments: No comment available   Primary Diagnosis:  <principal problem not specified> Principal Problem: <principal problem not specified>  Patient Active Problem List   Diagnosis Date Noted  . Stroke due to embolism of carotid artery ([place]) 06/05/2018  . Stroke-like symptom   . Dyspnea   . Acute blood loss anemia   . Acute ischemic stroke ([place]) 05/31/2018  . Stroke ([place]) 05/30/2018  . Chronic diastolic congestive heart failure ([place])   . Anemia of chronic disease   . Hypoalbuminemia due to protein-calorie malnutrition ([place])   . Labile blood pressure   . Atrial fibrillation ([place])   . Chronic neck pain   . Subcortical infarction ([place]) 04/27/2018  . CVA (cerebral vascular accident) ([place]) 04/26/2018  . TIA (transient ischemic attack) 04/24/2018  . CAD (coronary artery disease) 08/07/2016  . (HFpEF) heart failure with preserved ejection fraction ([place]) 08/07/2016  . Ischemic cardiomyopathy 08/07/2016  . Chest pain   . NSTEMI (non-ST elevated myocardial infarction) ([place])   . Status post coronary artery stent placement   . Abnormal stress test   . SOB (shortness of breath)   . Multifocal pneumonia 07/13/2016  . Elevated troponin 07/13/2016  . Leukocytosis   . Pacemaker 05/17/2014  . Complications, pacemaker cardiac, mechanical 12/31/2012  . Edema extremities 10/08/2012  . Persistent atrial fibrillation 10/08/2012  . Hypertension 10/08/2012  . Hypothyroidism 10/08/2012  . Neuropathy 10/08/2012  . Diabetes ([place]) 10/08/2012  . Depression 09/08/2012  . Anemia 08/08/2012  .  Knee fracture, left 08/07/2012  . Recurrent falls 08/07/2012  . Syncope 08/07/2012  . SDH (subdural hematoma) ([place]) 08/07/2012    Expected Discharge Date: Expected Discharge Date: 06/24/18  Team Members Present: Physician leading conference: Dr. Alysia Penna Social Worker Present: Ovidio Kin, LCSW Nurse Present: Ellison Carwin, LPN PT Present: Barrie Folk, PT;Rosita Dechalus, PTA OT Present: Darleen Crocker, OT SLP Present: Charolett Bumpers, SLP PPS Coordinator present : Gunnar Fusi     Current Status/Progress Goal Weekly Team Focus  Medical   Blood pressure improving, has severe neck pain suboccipital with upper cervical facet joint arthropathy noted on imaging studies.  Reduce fall risk, reduce pain without causing sedation  Pain control, multiple medication intolerances   Bowel/Bladder   continent of bowel & bladder, LBM 06/15/18  remain continent  continue to monitor & assist as needed   Swallow/Nutrition/ Hydration             ADL's   supervision to CGA with ADLs and mobility with RW,   mod I overall  balance training, ADL retraining, NMR, pt education   Mobility   mod I bed mobility with features, superivsion transfers, supervision gait 168ft with RW with improved posture over week, 32/56 Berg Balance test 3/24  Mod I with LRAD   balance, endurance   Communication             Safety/Cognition/ Behavioral Observations            Pain   unrelievable headache last night, received tylenol X2, no other pain  meds on MAR  pain scale <2/10  assess & treat as needed   Skin   ecchymosis bil arms, no break down  no new areas of skin break down  assess q shift      *See Care Plan and progress notes for long and short-term goals.     Barriers to Discharge  Current Status/Progress Possible Resolutions Date Resolved   Physician    Medical stability        Continue discharge planning, medication management for pain as above.      Nursing                  PT                     OT                  SLP                SW                Discharge Planning/Teaching Needs:  Doing well and going back to her independent home with son checking daily on her and providing her transportation to appointments      Team Discussion:  CT scan today due to neck pain that awoke her last night. She is progressing well in her therapies currently supervision level, goals for mod/i level. Berg improved to 36 from 26. Son doesn't want on muscle relaxer or pain meds.  Revisions to Treatment Plan:  DC 4/1    Continued Need for Acute Rehabilitation Level of Care: The patient requires daily medical management by a physician with specialized training in physical medicine and rehabilitation for the following conditions: Daily direction of a multidisciplinary physical rehabilitation program to ensure safe treatment while eliciting the highest outcome that is of practical value to the patient.: Yes Daily medical management of patient stability for increased activity during participation in an intensive rehabilitation regime.: Yes Daily analysis of laboratory values and/or radiology reports with any subsequent need for medication adjustment of medical intervention for : Neurological problems   I attest that I was present, lead the team conference, and concur with the assessment and plan of the team.   Elease Hashimoto 06/17/2018, 1:44 PM

## 2018-06-17 NOTE — Progress Notes (Signed)
Occupational Therapy Session Note  Patient Details  Name: Meredith Gonzalez MRN: [id number] Date of Birth: 1936-02-17  Today's Date: 06/17/2018 OT Individual Time: 1445-1455 OT Individual Time Calculation (min): 10 min  60 missed minutes secondary to pain and fatigue  Short Term Goals: Week 2:  OT Short Term Goal 1 (Week 2): STGs=LTGs secondary to upcoming discharge  Skilled Therapeutic Interventions/Progress Updates:    Upon entering the room, pt seated in recliner chair with recreational therapist present in the room as well. Pt was very polite but continues to report feeling very fatigues with pain in neck and currently has K pad donned. Pt declined OT intervention this session but discussed plans for tomorrows session before this therapist exited the room. Call bell and all needed items within reach.   Therapy Documentation Precautions:  Precautions Precautions: Fall Precaution Comments: many falls with Sept neck fracture Restrictions Weight Bearing Restrictions: No General: General OT Amount of Missed Time: 78 Minutes PT Missed Treatment Reason: Patient fatigue;Pain Vital Signs: Therapy Vitals Temp: 98.1 F (36.7 C) Pulse Rate: 70 Resp: 16 BP: (!) 120/51 Patient Position (if appropriate): Sitting Oxygen Therapy SpO2: 97 % O2 Device: Room Air ADL: ADL Toileting: Contact guard Where Assessed-Toileting: Bedside Commode Toilet Transfer: Minimal assistance Toilet Transfer Method: Stand pivot Toilet Transfer Equipment: Bedside commode   Therapy/Group: Individual Therapy  [name] 06/17/2018, 3:11 PM

## 2018-06-17 NOTE — Plan of Care (Signed)
  Problem: Consults Goal: RH GENERAL PATIENT EDUCATION Description See Patient Education module for education specifics. Outcome: Progressing   Problem: RH SKIN INTEGRITY Goal: RH STG SKIN FREE OF INFECTION/BREAKDOWN Description No new breakdown with min assist   Outcome: Progressing Goal: RH STG ABLE TO PERFORM INCISION/WOUND CARE W/ASSISTANCE Description STG Able To Perform Incision/Wound Care With Min Assistance.  Outcome: Progressing   Problem: RH SAFETY Goal: RH STG ADHERE TO SAFETY PRECAUTIONS W/ASSISTANCE/DEVICE Description STG Adhere to Safety Precautions With Min Assistance/Device.  Outcome: Progressing   Problem: RH PAIN MANAGEMENT Goal: RH STG PAIN MANAGED AT OR BELOW PT'S PAIN GOAL Description <3 out of 10.   Outcome: Progressing   

## 2018-06-17 NOTE — Progress Notes (Signed)
Patient is off the floor to CT scan via bed with transport.

## 2018-06-17 NOTE — Progress Notes (Signed)
Physical Therapy Session Note  Patient Details  Name: Meredith Gonzalez MRN: [id number] Date of Birth: [birth year]  Today's Date: 06/17/2018 PT Individual Time: 1105-1200 PT Individual Time Calculation (min): 55 min   Short Term Goals: Week 2:  PT Short Term Goal 1 (Week 2): STG=LTG due to ELOS  Skilled Therapeutic Interventions/Progress Updates:   Pt received supine in bed and agreeable to PT. Supine>sit transfer with supervision assist and min cues for decreased use of UE to prevent increased neck/head pain. Pt reports need for BM. Ambulatory transfer to toilet with supervision assist and RW. Pt able to have continent Urination and Bowel movement. Assist required From PT for pants and shoe management due to pain in neck. Ambulatory transfer back to recliner with Supervision assist and RW. Gait training instructed by PT x 137f and 1235fwith RW and supervision assist from PT. Nustep endurance and reciprocal movement training x 8 minutes BLE only, no increased pain throughout and RPE 6/10 upon completion. PT instructed pt in dynamic balance training to performed reciprocal foot taps on 6 inch cones 2 x 10 with BUE support on RW and x 10 without UE support. Supervision assist with RW and min assist without AD. Lateral foot taps x 10 BLE with UE support on RW. Throughout treatment pt performed sit<>stand from various surfaces with distant supervision assist and cues for proper UE placement to improve safety and success. Patient returned to room and left sitting in recliner with call bell in reach and all needs met.            Therapy Documentation Precautions:  Precautions Precautions: Fall Precaution Comments: many falls with Sept neck fracture Restrictions Weight Bearing Restrictions: No Pain:   4/10 Head/neck. Sore. See MAR   Therapy/Group: Individual Therapy  AuLorie Phenix/25/2020, 1:30 PM

## 2018-06-17 NOTE — Progress Notes (Signed)
Physical Therapy Session Note  Patient Details  Name: Meredith Gonzalez MRN: [id number] Date of Birth: 07/04/1935     Short Term Goals: Week 2:  PT Short Term Goal 1 (Week 2): STG=LTG due to ELOS  Skilled Therapeutic Interventions/Progress Updates: Pt refusal due to significant HA and fatigue. Will continue efforts and re-attempt later in day.      Therapy Documentation Precautions:  Precautions Precautions: Fall Precaution Comments: many falls with Sept neck fracture Restrictions Weight Bearing Restrictions: No General: PT Amount of Missed Time (min): 75 Minutes PT Missed Treatment Reason: Patient fatigue;Pain   Therapy/Group: Individual Therapy  Freddrick Gladson  Breeanna Galgano, PTA  06/17/2018, 1:27 PM

## 2018-06-18 ENCOUNTER — Inpatient Hospital Stay (HOSPITAL_COMMUNITY): Payer: Medicare Other | Admitting: Occupational Therapy

## 2018-06-18 ENCOUNTER — Inpatient Hospital Stay (HOSPITAL_COMMUNITY): Payer: Medicare Other

## 2018-06-18 ENCOUNTER — Inpatient Hospital Stay (HOSPITAL_COMMUNITY): Payer: Medicare Other | Admitting: Physical Therapy

## 2018-06-18 ENCOUNTER — Ambulatory Visit (INDEPENDENT_AMBULATORY_CARE_PROVIDER_SITE_OTHER): Payer: Medicare Other | Admitting: *Deleted

## 2018-06-18 DIAGNOSIS — I255 Ischemic cardiomyopathy: Secondary | ICD-10-CM | POA: Diagnosis not present

## 2018-06-18 LAB — CUP PACEART REMOTE DEVICE CHECK
Date Time Interrogation Session: 20200326155519
Implantable Lead Implant Date: 20110523
Implantable Lead Implant Date: 20141010
Implantable Lead Location: 753859
Implantable Lead Location: 753860
Implantable Lead Model: 346
Implantable Lead Model: 350
Implantable Lead Serial Number: 28756637
Implantable Lead Serial Number: 29491855
Implantable Pulse Generator Implant Date: 20110523
Pulse Gen Serial Number: 66080052

## 2018-06-18 LAB — GLUCOSE, CAPILLARY
Glucose-Capillary: 97 mg/dL (ref 70–99)
Glucose-Capillary: 120 mg/dL — ABNORMAL HIGH (ref 70–99)
Glucose-Capillary: 127 mg/dL — ABNORMAL HIGH (ref 70–99)
Glucose-Capillary: 154 mg/dL — ABNORMAL HIGH (ref 70–99)

## 2018-06-18 NOTE — Progress Notes (Signed)
Upon assisting the patient to the restroom with rolling walker, patient went to stand & started to fall back onto the back of the toilet. She did not hit her back because staff was present & was there to hold her. Her eyes were closed at the time. When asked, patient stated that she was dizzy. Second attempt to stand was successfulasfter waiting a few seconds, but when she started walking, was a little unsteady. She stopped at the sink & was again dizzy. Was encouraged to stop, take deep breaths & steady herself while she stood holding onto the sink. She did this, washed her hands & walked to the bed. She got in hurriedly, but seemed a little off. She was again asked about her dizziness & she stated that she was still dizzy. Vitals were taken laying & standing & she was having some orthostasis. No acute distress noted. No c/o pain at this time. Will continue to monitor.

## 2018-06-18 NOTE — Progress Notes (Signed)
Physical Therapy Session Note  Patient Details  Name: Meredith Gonzalez MRN: [id number] Date of Birth: 03/23/1936  Today's Date: 06/18/2018 PT Individual Time: 0915-1030 PT Individual Time Calculation (min): 75 min   Short Term Goals: Week 2:  PT Short Term Goal 1 (Week 2): STG=LTG due to ELOS  Skilled Therapeutic Interventions/Progress Updates:   Pt received sitting in WC and agreeable to PT. Sit<>stand from Recliner without AD and supervision assist from PT. Gait training through the hall with RW x 180ft and supervision assist from PT for safety, min cues for AD management through tight spaces.   BLE strengthening/NMR. Anterior step ups on 6 inch step x 8 BLE. And lateral step ups x 8 BLE. Pt utilizes BUE support on rails for balance. Hip abduction with level 2 tband 2x 8, cues for full ROM and increased control of eccentric movement. Side stepping with level 2 tband and RW 8ft x 2 Bil, verbal instruction for improved step length on the L and decreased trunk compensations. Hip extension from seated position x 10 BLE with AAROM to lift LLE.    Gait training with RW to weave through 6 cones with distant supervision assist. Pt provided with Rollator and performed weave through 6 cones x 2 with distant supervision assist from PT. No significant change in gait pattern noted with rollator vs RW. Additional gait training with Rollator through hall of rehab unit 2 x 200ft with distant supervision assist from PT for safety. Pt able to self correct poor posture without cues from PT.   Dynamic balance training with Wii sports bowling. Able to maintain balance with min A, CGA overall from PT x 10 minutes and no UE support with adequate weight shifting trunk rotation to engage in game. Mod assist x 1 to prevent anteiror LOB.  Patient returned to room and left sitting in WC with call bell in reach and all needs met.           Therapy Documentation Precautions:  Precautions Precautions: Fall Precaution  Comments: many falls with Sept neck fracture Restrictions Weight Bearing Restrictions: No Vital Signs: Oxygen Therapy SpO2: 95 % O2 Device: Room Air Pain: Pain Assessment Pain Score: 0-No pain    Therapy/Group: Individual Therapy   E  06/18/2018, 12:23 PM  

## 2018-06-18 NOTE — Progress Notes (Signed)
[place] PHYSICAL MEDICINE & REHABILITATION PROGRESS NOTE   Subjective/Complaints:  Neck feeling better the robaxin and ice packs seemed to help, no pain shooting down arms Good bm today  ROS: Patient denies CP, SOB, N/V/D    Objective:   Dg Cervical Spine Complete  Result Date: 06/17/2018 CLINICAL DATA:  Occipital pain. EXAM: CERVICAL SPINE - COMPLETE 4+ VIEW COMPARISON:  CT scan of May 30, 2018. FINDINGS: Findings consistent with old odontoid fracture as seen on prior CT scan. Minimal grade 1 anterolisthesis of C3-4 and C4-5 is noted secondary to posterior facet joint hypertrophy. Fusion of C5-6 and C6-7 is noted which most likely is degenerative in etiology. Degenerative changes are seen involving posterior facet joints bilaterally. No acute fracture is noted. No definite neural foraminal stenosis is noted. No prevertebral soft tissue swelling is noted. IMPRESSION: Findings consistent with old odontoid fracture as seen on prior CT scan. Degenerative changes are noted throughout the cervical spine as described above. No definite acute abnormality is noted. Electronically Signed   By: Marijo Conception, M.D.   On: 06/17/2018 09:58   Ct Head Wo Contrast  Result Date: 06/17/2018 CLINICAL DATA:  Bilateral occipital headaches radiating to frontal regions EXAM: CT HEAD WITHOUT CONTRAST TECHNIQUE: Contiguous axial images were obtained from the base of the skull through the vertex without intravenous contrast. COMPARISON:  May 31, 2018 FINDINGS: Brain: Mild diffuse atrophy is stable. There is no intracranial mass, hemorrhage, extra-axial fluid collection, or midline shift. There is small vessel disease in the centra semiovale bilaterally, stable. Small vessel disease is also noted in each lateral thalamus and in the anterior limbs of the internal and external capsules bilaterally, stable. No acute appearing infarct is evident on this study. Vascular: There is no appreciable hyperdense vessel. There  is calcification in each carotid siphon region. Skull: The bony calvarium appears intact. Sinuses/Orbits: There is mucosal thickening in several ethmoid air cells. Other visualized paranasal sinuses are clear. Visualized orbits appear symmetric bilaterally. Other: Mastoid air cells are clear. IMPRESSION: Stable atrophy with supratentorial small vessel disease. No acute infarct evident. No mass or hemorrhage. There are foci of arterial vascular calcification. There is mucosal thickening in several ethmoid air cells. Electronically Signed   By: Lowella Grip III M.D.   On: 06/17/2018 07:15   Recent Labs    06/17/18 0548  WBC 6.2  HGB 9.1*  HCT 30.0*  PLT 230   No results for input(s): NA, K, CL, CO2, GLUCOSE, BUN, CREATININE, CALCIUM in the last 72 hours.  Intake/Output Summary (Last 24 hours) at 06/18/2018 0820 Last data filed at 06/18/2018 0800 Gross per 24 hour  Intake 580 ml  Output -  Net 580 ml     Physical Exam: Vital Signs Blood pressure (!) 129/56, pulse 70, temperature 98 F (36.7 C), temperature source Oral, resp. rate 18, height 5\' 5"  (1.651 m), weight 62 kg, SpO2 95 %. Constitutional: No distress . Vital signs reviewed. HEENT: EOMI, oral membranes moist Neck: tender in the left suboccipital area Cardiovascular: RRR without murmur. No JVD    Respiratory: CTA Bilaterally without wheezes or rales. Normal effort    GI: BS +, non-tender, non-distended  Musculoskeletal:Normal range of motion.  Neurological: She isalertand oriented to person, place, and time. Sl ataxic speech.   LUE 4/5with impaired FMC. LLE grossly 4/5. RUE and RLE 5/5. No focal sensory findings are appreciated. .  +trunkal ataxia Mild dysmetria LLE Heel to shin  Skin: Skin iswarmand dry. She isnot diaphoretic.  Psychiatric: pleasant and cooperative ,     Assessment/Plan: 1. Functional deficits secondary to embolic right cortical infarct which require 3+ hours per day of interdisciplinary  therapy in a comprehensive inpatient rehab setting.  Physiatrist is providing close team supervision and 24 hour management of active medical problems listed below.  Physiatrist and rehab team continue to assess barriers to discharge/monitor patient progress toward functional and medical goals  Care Tool:  Bathing  Bathing activity did not occur: Refused(on toilet for majority of eval) Body parts bathed by patient: Right arm, Left arm, Chest, Abdomen, Right upper leg, Left upper leg, Front perineal area, Buttocks, Face, Left lower leg, Right lower leg   Body parts bathed by helper: Left lower leg, Right lower leg     Bathing assist Assist Level: Supervision/Verbal cueing     Upper Body Dressing/Undressing Upper body dressing Upper body dressing/undressing activity did not occur (including orthotics): Refused(on toilet for majority of eval) What is the patient wearing?: Pull over shirt    Upper body assist Assist Level: Set up assist    Lower Body Dressing/Undressing Lower body dressing    Lower body dressing activity did not occur: Refused(on toilet for majority of eval) What is the patient wearing?: Underwear/pull up, Pants     Lower body assist Assist for lower body dressing: Minimal Assistance - Patient > 75%     Toileting Toileting    Toileting assist Assist for toileting: Contact Guard/Touching assist     Transfers Chair/bed transfer  Transfers assist     Chair/bed transfer assist level: Supervision/Verbal cueing     Locomotion Ambulation   Ambulation assist      Assist level: Supervision/Verbal cueing Assistive device: Walker-rolling Max distance: 150   Walk 10 feet activity   Assist     Assist level: Supervision/Verbal cueing Assistive device: Walker-rolling   Walk 50 feet activity   Assist    Assist level: Supervision/Verbal cueing Assistive device: Walker-rolling    Walk 150 feet activity   Assist Walk 150 feet activity did  not occur: Safety/medical concerns  Assist level: Supervision/Verbal cueing Assistive device: Walker-rolling    Walk 10 feet on uneven surface  activity   Assist     Assist level: Minimal Assistance - Patient > 75% Assistive device: Aeronautical engineer   Type of Wheelchair: Manual    Wheelchair assist level: Supervision/Verbal cueing Max wheelchair distance: 145ft    Wheelchair 50 feet with 2 turns activity    Assist        Assist Level: Supervision/Verbal cueing    Wheelchair 150 feet activity     Assist     Assist Level: Supervision/Verbal cueing    Medical Problem List and Plan: 1.Functional and mobility deficitssecondary to embolic right cortical infarct --Continue CIR, PT, OT, SLP 2. Antithrombotics: -DVT/anticoagulation:Pharmaceutical:Other (comment)--Eliquis -antiplatelet therapy: N/A 3.Chronic neck pain/Pain Management:On gabapentin tid with robaxin prn -no tramadol d/t intolerance  -added kpad prn 4. Mood:LCSW to follow for evaluation and support. -antipsychotic agents: N/A 5. Neuropsych: This patientiscapable of making decisions on herown behalf. 6. Skin/Wound Care:Routine pressure relief measures. 7. Fluids/Electrolytes/Nutrition:Monitor I/O.   I personally reviewed the patient's labs today.   -BUN trending up (26)---encourage fluids, held lasix I 355ml- poor appetite yesterdaydue to pain 8. HTN: Monitor BP bid  -had been on low dose lasix (hold see #7). Vitals:   06/17/18 2018 06/18/18 0515  BP:  (!) 129/56  Pulse:  70  Resp:  18  Temp:  98 F (36.7 C)  SpO2: 92% 95%  controlled 3/26 9. T2DMwith peripheral neuropathy:Hgb A1c- 5.7. Diet controlled.  -controlled 3/15 10 A fib s/p NVV:[id number] HR bid--continue amiodarone and Eliquis 11. DOE/Likely COPD: Recently started on Dulera.Resumed albuterol prn 12.R-CEA s/p CEA 3/11: monitor  wound and dysphagia 13. Anemia: stable 3/25 -no gross blood loss  -recheck next week 14.  Emotional lability post CVA - trial sertraline 25mg  15.  C spine pain upper left facet most likely pain generator as noted on CTA neck ~3wk ago no new falls or trauma, no fever no WBC elevation. No radic or myelopathic finding on exam Check spine xrays, encourage use of robaxin, very sensitive to narcotics so will avoid, did not use toradol, will d/c LOS: 13 days A FACE TO FACE EVALUATION WAS PERFORMED  Charlett Blake 06/18/2018, 8:20 AM

## 2018-06-18 NOTE — Progress Notes (Signed)
Physical Therapy Session Note  Patient Details  Name: Datra Clary MRN: [id number] Date of Birth: 1935-10-03  Today's Date: 06/18/2018 PT Individual Time: 8366-2947 PT Individual Time Calculation (min): 30 min   Short Term Goals: Week 2:  PT Short Term Goal 1 (Week 2): STG=LTG due to ELOS  Skilled Therapeutic Interventions/Progress Updates:    Patient in recliner in room agreeable to PT.  Performed in room balance activities to include step taps to cone with min A, no UE support, sit<>stand x 5 with UE support, standing at sink for SLS, tandem stand up to 6-8 seconds max.  Wall bumps with demonstration x 10 reps, tandem walking, backwards walking, side stepping toe and heel walking.  At rail in hallway braiding with min A.  LOB with backwards walking worse than other activities.  Left in recliner with call bell and needs in reach.  Therapy Documentation Precautions:  Precautions Precautions: Fall Precaution Comments: many falls with Sept neck fracture Restrictions Weight Bearing Restrictions: No        Therapy/Group: Individual Therapy  Reginia Naas  [place], PT 06/18/2018, 11:24 AM

## 2018-06-18 NOTE — Progress Notes (Signed)
Occupational Therapy Weekly Progress Note  Patient Details  Name: Meredith Gonzalez MRN: [id number] Date of Birth: [birth year]  Beginning of progress report period: June 11, 2018 End of progress report period: June 18, 2018  Today's Date: 06/18/2018 OT Individual Time: 1230-1330 and 1445-1555 OT Individual Time Calculation (min): 60 min and 75 min   Patient has met 0 of 12 long term goals.  Short term goals not set due to estimated length of stay. Pt making steady progress towards occupational therapy goals this week. Pt at overall supervision level with use of rollator and AE to increase independence. Pt requiring min cuing for safety awareness. Pt continues to benefit from OT intervention.   Patient continues to demonstrate the following deficits: muscle weakness, decreased cardiorespiratoy endurance and decreased sitting balance, decreased standing balance and decreased balance strategies and therefore will continue to benefit from skilled OT intervention to enhance overall performance with BADL and iADL.  See Patient's Care Plan for progression toward long term goals.  Patient progressing toward long term goals..  Continue plan of care.  Skilled Therapeutic Interventions/Progress Updates:    Session 1: Upon entering the room, pt seated in recliner chair with no c/o pain. Pt ambulating rollator 100' to day room with supervision. Pt engaged in standing horse shoe toss without LOB and close supervision. Pt utilized LH reacher to pick up horse shoes from floor. OT discussed importance of keep this close when at home to decrease fall risk. Pt attempted sit <>stand from mat with focus on anterior weight shift and no UE support. Pt needing mod lifting assistance to stand from mat x 4.However, pt is able to use B UEs for sit <>stand from same height with close supervision. Pt verbalized fatigue and returning to room in same manner as above. Pt returning to recliner chair with call bell and all needed  items within reach upon exiting the room.   Session 2: Upon entering the room, pt seated in recliner chair with no c/o pain and agreeable to OT intervention. Pt standing and ambulating with rollator to bathroom with close supervision. Pt given min cuing to obtain all needed items for shower and set up bathroom similar to home environment. Pt seated on toilet for bathroom needs and doffing clothing while seated on commode with use of LH reacher with overall supervision. Pt transferred onto TTB and notified OT when standing to wash buttocks which she did with supervision as well. Pt returning to sit on seat of rollator to don clothing items with increased time, supervision for safety, and use of AE for LB self care. Pt utilized reacher to obtain dirty clothing and draped over rollator before depositing it in dirty clothes drawer at supervision level. Pt taking seated rest break and OT continued education regarding energy conservation for self care tasks. Pt verbalized understanding. Education to continue. Pt remained seated in wheelchair with call bell and all needed items within reach.  Therapy Documentation Precautions:  Precautions Precautions: Fall Precaution Comments: many falls with Sept neck fracture Restrictions Weight Bearing Restrictions: No General:   Vital Signs: Therapy Vitals Temp: 98.4 F (36.9 C) Temp Source: Oral Pulse Rate: 67 Resp: 18 BP: 125/65 Patient Position (if appropriate): Sitting Oxygen Therapy SpO2: 97 % O2 Device: Room Air Pain: Pain Assessment Pain Score: 0-No pain ADL: ADL Toileting: Contact guard Where Assessed-Toileting: Bedside Commode Toilet Transfer: Minimal assistance Toilet Transfer Method: Stand pivot Toilet Transfer Equipment: Bedside commode   Therapy/Group: Individual Therapy  [name] 06/18/2018, 3:05 PM

## 2018-06-19 ENCOUNTER — Inpatient Hospital Stay (HOSPITAL_COMMUNITY): Payer: Medicare Other | Admitting: Physical Therapy

## 2018-06-19 ENCOUNTER — Inpatient Hospital Stay (HOSPITAL_COMMUNITY): Payer: Medicare Other | Admitting: Occupational Therapy

## 2018-06-19 LAB — BASIC METABOLIC PANEL
Anion gap: 8 (ref 5–15)
BUN: 19 mg/dL (ref 8–23)
CO2: 23 mmol/L (ref 22–32)
Calcium: 8.8 mg/dL — ABNORMAL LOW (ref 8.9–10.3)
Chloride: 108 mmol/L (ref 98–111)
Creatinine, Ser: 0.83 mg/dL (ref 0.44–1.00)
GFR calc Af Amer: >60 mL/min (ref >60)
GFR calc non Af Amer: >60 mL/min (ref >60)
Glucose, Bld: 138 mg/dL — ABNORMAL HIGH (ref 70–99)
Potassium: 4.6 mmol/L (ref 3.5–5.1)
Sodium: 139 mmol/L (ref 135–145)

## 2018-06-19 LAB — GLUCOSE, CAPILLARY
Glucose-Capillary: 86 mg/dL (ref 70–99)
Glucose-Capillary: 109 mg/dL — ABNORMAL HIGH (ref 70–99)
Glucose-Capillary: 80 mg/dL (ref 70–99)
Glucose-Capillary: 132 mg/dL — ABNORMAL HIGH (ref 70–99)

## 2018-06-19 NOTE — Progress Notes (Signed)
Physical Therapy Weekly Progress Note  Patient Details  Name: Meredith Gonzalez MRN: [id number] Date of Birth: [birth year]  Beginning of progress report period: June 12, 2018 End of progress report period: June 19, 2018  Today's Date: 06/19/2018 PT Individual Time: 1040-1150 AND 4193-7902 PT Individual Time Calculation (min): 70 min and 72 min  Patient has met 1 of 1 short term goals. Pt continues to make progress towards long term goals of Mod I at house hold ambulatory level. Pt is currently supervision assist with rollator for all mobility tasks including bed mobility, gait, transfers, and Stair management. Pt had set back this week with 10/10 neck/shoulder head pain limiting increased progress to mod I. Medications have been adjusted and pt is now pain free.   Patient continues to demonstrate the following deficits muscle weakness, decreased cardiorespiratoy endurance and decreased standing balance, decreased postural control, hemiplegia and decreased balance strategies and therefore will continue to benefit from skilled PT intervention to increase functional independence with mobility.  Patient progressing toward long term goals..  Continue plan of care.  PT Short Term Goals Week 2:  PT Short Term Goal 1 (Week 2): STG=LTG due to ELOS PT Short Term Goal 1 - Progress (Week 2): Progressing toward goal Week 3:  PT Short Term Goal 1 (Week 3): STG=LTG due to ELOS  Skilled Therapeutic Interventions/Progress Updates:   Pt received sitting in WC and agreeable to PT  Gait training with Rollator x 259f, distant supervision assist from PT for safety and min cues for AD management through obstacles in room. Dynamic gait training with Rollator over unlevel surface, and to weve through 6 cones , completed x 2.   Stair management training x 12 steps (6") with supervision assist from PT and min cues for step to gait pattern rathern than step over step to improve safety and confidence     Dynamic  balance training while standing on Airex pad x 15 minutes while engaged in Wii sports bowling. CGA-Supervision assist from PT. Significantly improved use of ankle strategy to prevent posterior and lateral LOB intermittent throughout balance training.   Nustep reciprocal endurance and strength training x 8 min, level 5>6 with cues for improved hip alignment. [facility] and all needs met.     Session 2.   Pt received sitting EOM finishing previous PT treatment. PT treatment focused on gait training in simulated community environment of hospital gift shop, [employer], and over brick and cement side walk with Rollator up to [id number] requiring seated rest break x5. Supervision assist from PT throughout gait training in all environments for safety with min cues for safety with Rollator parts management while reaching for objects outside BOS. Pt able to purchase multiple items while in gift shop and [employer] bread and stay within $5 limit.  Patient returned to room and left sitting in recliner  with call bell in reach and all needs met.        Therapy Documentation Precautions:  Precautions Precautions: Fall Precaution Comments: many falls with Sept neck fracture Restrictions Weight Bearing Restrictions: No    Vital Signs: Oxygen Therapy O2 Device: Room Air Pain: Pain Assessment Pain Scale: 0-10 Pain Score: 0-No pain   Therapy/Group: Individual Therapy  AuLorie Phenix/27/2020, 1:05 PM

## 2018-06-19 NOTE — Progress Notes (Signed)
Physical Therapy Session Note  Patient Details  Name: Meredith Gonzalez MRN: [id number] Date of Birth: 1935-09-05  Today's Date: 06/19/2018 PT Individual Time: 1455-1518 PT Individual Time Calculation (min): 23 min   Short Term Goals: Week 2:  PT Short Term Goal 1 (Week 2): STG=LTG due to ELOS  Skilled Therapeutic Interventions/Progress Updates:  Pt seen to make up missed time from previous date.   Pt received in recliner & agreeable to tx. No c/o pain reported. Pt transfers sit<>stand with supervision and ambulates to gym with rollator and supervision. Pt performs floor transfer with supervision. While on floor pt transitioned to quadruped position and engaged in lifting each extremity then progressing to bird dog task with task focusing on core/trunk strengthening & control. Pt completed 5x sit<>stand x 2 trials with mod assist with focus on BLE strengthening and standing balance with pt demonstrating good anterior weight shifting but greatest difficulty is powering up to standing 2/2 BLE weakness. At end of session pt left in handoff to next PT.  Therapy Documentation Precautions:  Precautions Precautions: Fall Precaution Comments: many falls with Sept neck fracture Restrictions Weight Bearing Restrictions: No    Therapy/Group: Individual Therapy  [name] 06/19/2018, 3:22 PM

## 2018-06-19 NOTE — Progress Notes (Signed)
[facility] PHYSICAL MEDICINE & REHABILITATION PROGRESS NOTE   Subjective/Complaints:  Was dizzy yesterday evening when getting up with  Therapy, no dizziness at rest, no ortho BPs recorded  ROS: Patient denies CP, SOB, N/V/D    Objective:   Dg Cervical Spine Complete  Result Date: 06/17/2018 CLINICAL DATA:  Occipital pain. EXAM: CERVICAL SPINE - COMPLETE 4+ VIEW COMPARISON:  CT scan of May 30, 2018. FINDINGS: Findings consistent with old odontoid fracture as seen on prior CT scan. Minimal grade 1 anterolisthesis of C3-4 and C4-5 is noted secondary to posterior facet joint hypertrophy. Fusion of C5-6 and C6-7 is noted which most likely is degenerative in etiology. Degenerative changes are seen involving posterior facet joints bilaterally. No acute fracture is noted. No definite neural foraminal stenosis is noted. No prevertebral soft tissue swelling is noted. IMPRESSION: Findings consistent with old odontoid fracture as seen on prior CT scan. Degenerative changes are noted throughout the cervical spine as described above. No definite acute abnormality is noted. Electronically Signed   By: Marijo Conception, M.D.   On: 06/17/2018 09:58   Recent Labs    06/17/18 0548  WBC 6.2  HGB 9.1*  HCT 30.0*  PLT 230   No results for input(s): NA, K, CL, CO2, GLUCOSE, BUN, CREATININE, CALCIUM in the last 72 hours.  Intake/Output Summary (Last 24 hours) at 06/19/2018 0800 Last data filed at 06/18/2018 1851 Gross per 24 hour  Intake 444 ml  Output -  Net 444 ml     Physical Exam: Vital Signs Blood pressure 132/61, pulse 70, temperature 97.8 F (36.6 C), temperature source Oral, resp. rate 18, height 5\' 5"  (1.651 m), weight 62 kg, SpO2 96 %. Constitutional: No distress . Vital signs reviewed. HEENT: EOMI, oral membranes moist Neck: tender in the left suboccipital area Cardiovascular: RRR without murmur. No JVD    Respiratory: CTA Bilaterally without wheezes or rales. Normal effort    GI: BS +,  non-tender, non-distended  Musculoskeletal:Normal range of motion.  Neurological: She isalertand oriented to person, place, and time. Sl ataxic speech.   LUE 4/5with impaired FMC. LLE grossly 4/5. RUE and RLE 5/5. No focal sensory findings are appreciated. .  +trunkal ataxia Mild dysmetria LLE Heel to shin  Skin: Skin iswarmand dry. She isnot diaphoretic.  Psychiatric: pleasant and cooperative ,     Assessment/Plan: 1. Functional deficits secondary to embolic right cortical infarct which require 3+ hours per day of interdisciplinary therapy in a comprehensive inpatient rehab setting.  Physiatrist is providing close team supervision and 24 hour management of active medical problems listed below.  Physiatrist and rehab team continue to assess barriers to discharge/monitor patient progress toward functional and medical goals  Care Tool:  Bathing  Bathing activity did not occur: Refused(on toilet for majority of eval) Body parts bathed by patient: Right arm, Left arm, Chest, Abdomen, Right upper leg, Left upper leg, Front perineal area, Buttocks, Face, Left lower leg, Right lower leg   Body parts bathed by helper: Left lower leg, Right lower leg     Bathing assist Assist Level: Supervision/Verbal cueing     Upper Body Dressing/Undressing Upper body dressing Upper body dressing/undressing activity did not occur (including orthotics): Refused(on toilet for majority of eval) What is the patient wearing?: Pull over shirt    Upper body assist Assist Level: Supervision/Verbal cueing    Lower Body Dressing/Undressing Lower body dressing    Lower body dressing activity did not occur: Refused(on toilet for majority of eval) What  is the patient wearing?: Underwear/pull up, Pants     Lower body assist Assist for lower body dressing: Supervision/Verbal cueing     Toileting Toileting    Toileting assist Assist for toileting: Supervision/Verbal cueing      Transfers Chair/bed transfer  Transfers assist     Chair/bed transfer assist level: Supervision/Verbal cueing     Locomotion Ambulation   Ambulation assist      Assist level: Minimal Assistance - Patient > 75% Assistive device: Other (comment)(none) Max distance: 22   Walk 10 feet activity   Assist     Assist level: Minimal Assistance - Patient > 75% Assistive device: Other (comment)(no device)   Walk 50 feet activity   Assist    Assist level: Supervision/Verbal cueing Assistive device: Walker-rolling    Walk 150 feet activity   Assist Walk 150 feet activity did not occur: Safety/medical concerns  Assist level: Supervision/Verbal cueing Assistive device: Walker-rolling    Walk 10 feet on uneven surface  activity   Assist     Assist level: Minimal Assistance - Patient > 75% Assistive device: Aeronautical engineer   Type of Wheelchair: Manual    Wheelchair assist level: Supervision/Verbal cueing Max wheelchair distance: 141ft    Wheelchair 50 feet with 2 turns activity    Assist        Assist Level: Supervision/Verbal cueing    Wheelchair 150 feet activity     Assist     Assist Level: Supervision/Verbal cueing    Medical Problem List and Plan: 1.Functional and mobility deficitssecondary to embolic right cortical infarct --Continue CIR, PT, OT, SLP 2. Antithrombotics: -DVT/anticoagulation:Pharmaceutical:Other (comment)--Eliquis -antiplatelet therapy: N/A 3.Chronic neck pain/Pain Management:On gabapentin tid with robaxin prn -no tramadol d/t intolerance  -added kpad prn 4. Mood:LCSW to follow for evaluation and support. -antipsychotic agents: N/A 5. Neuropsych: This patientiscapable of making decisions on herown behalf. 6. Skin/Wound Care:Routine pressure relief measures. 7. Fluids/Electrolytes/Nutrition:Monitor I/O.   I  personally reviewed the patient's labs today.   -BUN trending up (26)---encourage fluids, held lasix I 671ml-recheck BMET 8. HTN: Monitor BP bid  -had been on low dose lasix (hold see #7). Vitals:   06/18/18 2220 06/19/18 0448  BP: 104/61 132/61  Pulse: 82 70  Resp:  18  Temp:  97.8 F (36.6 C)  SpO2:  96%  controlled 3/27 but with some orthostatic symptoms add thigh hi TED hose 9. T2DMwith peripheral neuropathy:Hgb A1c- 5.7. Diet controlled.  -controlled 3/15 10 A fib s/p UUE:[id number] HR bid--continue amiodarone and Eliquis 11. DOE/Likely COPD: Recently started on Dulera.Resumed albuterol prn 12.R-CEA s/p CEA 3/11: monitor wound and dysphagia 13. Anemia: stable 3/25 -no gross blood loss  -recheck next week 14.  Emotional lability post CVA - trial sertraline 25mg  15.  C spine pain upper left facet most likely pain generator as noted on CTA neck ~3wk ago no new falls or trauma, no fever no WBC elevation. No radic or myelopathic finding on exam reviewed spine xrays, encourage use of robaxin, very sensitive to narcotics so will avoid,  LOS: 14 days A FACE TO FACE EVALUATION WAS PERFORMED  Charlett Blake 06/19/2018, 8:00 AM

## 2018-06-19 NOTE — Progress Notes (Signed)
Occupational Therapy Session Note  Patient Details  Name: Meredith Gonzalez MRN: [id number] Date of Birth: Oct 08, 1935  Today's Date: 06/19/2018 OT Individual Time: 2637-8588 OT Individual Time Calculation (min): 71 min   Short Term Goals: Week 2:  OT Short Term Goal 1 (Week 2): STGs=LTGs secondary to upcoming discharge  Skilled Therapeutic Interventions/Progress Updates:    Pt greeted in recliner with no c/o pain or dizziness at rest. Wanting to get dressed and complete selected grooming tasks at sink. Tx focus on Lt NMR, DME safety, functional ambulation with device, and standing balance. All functional transfers completed at ambulatory level with rollator with mod vcs for locking brakes when stopping to retrieve ADL items and during sitting/standing transitions. Pt first completed oral care/grooming tasks while sitting and standing at sink using rollator as needed. Mod vcs for increasing functional use of Lt when engaging in tasks such as applying dry shampoo/hair spray, and combing hair. Able to stand for 8 minutes while completing oral care and UB bathing. Still no c/o dizziness at this time. Pt completed dressing sit<stand using rollator. L UE retrieved clothing items from w/c seat using reacher with vcs. OT donned socks and Ted stockings. Per pt, she uses sock aide and shoe funnel/shoe horn at home and declined using AE during session today. Pt able to complete the rest of dressing tasks with close supervision for vcs with DME mgt. She also doffed/donned pillowcases for bilateral integration. Afterwards pt ambulated back to recliner. Provided her with [facility] HEP working on in hand translations, 3 jaw chuck and pincer grasping patterns. Pt with spillage when 7 or more small blocks were held in Lt hand. At end of session pt was set up for breakfast and left with all needs within reach. Still no c/o dizziness.       Therapy Documentation Precautions:  Precautions Precautions: Fall Precaution  Comments: many falls with Sept neck fracture Restrictions Weight Bearing Restrictions: No Vital Signs: Therapy Vitals Temp: 98.5 F (36.9 C) Temp Source: Oral Pulse Rate: 69 Resp: 19 BP: (!) 102/50 Patient Position (if appropriate): Sitting Oxygen Therapy SpO2: 97 % O2 Device: Room Air ADL: ADL Toileting: Contact guard Where Assessed-Toileting: Bedside Commode Toilet Transfer: Minimal assistance Toilet Transfer Method: Stand pivot Toilet Transfer Equipment: Bedside commode      Therapy/Group: Individual Therapy  Meredith Gonzalez 06/19/2018, 3:57 PM

## 2018-06-19 NOTE — Plan of Care (Signed)
  Problem: Consults Goal: RH GENERAL PATIENT EDUCATION Description See Patient Education module for education specifics. Outcome: Progressing   Problem: RH SKIN INTEGRITY Goal: RH STG SKIN FREE OF INFECTION/BREAKDOWN Description No new breakdown with min assist   Outcome: Progressing Goal: RH STG ABLE TO PERFORM INCISION/WOUND CARE W/ASSISTANCE Description STG Able To Perform Incision/Wound Care With Min Assistance.  Outcome: Progressing   Problem: RH SAFETY Goal: RH STG ADHERE TO SAFETY PRECAUTIONS W/ASSISTANCE/DEVICE Description STG Adhere to Safety Precautions With Min Assistance/Device.  Outcome: Progressing   Problem: RH PAIN MANAGEMENT Goal: RH STG PAIN MANAGED AT OR BELOW PT'S PAIN GOAL Description <3 out of 10.   Outcome: Progressing   

## 2018-06-20 ENCOUNTER — Inpatient Hospital Stay (HOSPITAL_COMMUNITY): Payer: Medicare Other | Admitting: Occupational Therapy

## 2018-06-20 ENCOUNTER — Inpatient Hospital Stay (HOSPITAL_COMMUNITY): Payer: Medicare Other | Admitting: Physical Therapy

## 2018-06-20 LAB — GLUCOSE, CAPILLARY
GLUCOSE-CAPILLARY: 97 mg/dL (ref 70–99)
Glucose-Capillary: 83 mg/dL (ref 70–99)
Glucose-Capillary: 112 mg/dL — ABNORMAL HIGH (ref 70–99)

## 2018-06-20 LAB — CBC
HCT: 31.5 % — ABNORMAL LOW (ref 36.0–46.0)
Hemoglobin: 9.1 g/dL — ABNORMAL LOW (ref 12.0–15.0)
MCH: 29.9 pg (ref 26.0–34.0)
MCHC: 28.9 g/dL — ABNORMAL LOW (ref 30.0–36.0)
MCV: 103.6 fL — ABNORMAL HIGH (ref 80.0–100.0)
Platelets: 208 10*3/uL (ref 150–400)
RBC: 3.04 MIL/uL — ABNORMAL LOW (ref 3.87–5.11)
RDW: 18.9 % — ABNORMAL HIGH (ref 11.5–15.5)
WBC: 4.8 10*3/uL (ref 4.0–10.5)
nRBC: 0 % (ref 0.0–0.2)

## 2018-06-20 NOTE — Plan of Care (Signed)
  Problem: Consults Goal: RH GENERAL PATIENT EDUCATION Description See Patient Education module for education specifics. Outcome: Progressing   Problem: RH SKIN INTEGRITY Goal: RH STG SKIN FREE OF INFECTION/BREAKDOWN Description No new breakdown with min assist   Outcome: Progressing Goal: RH STG ABLE TO PERFORM INCISION/WOUND CARE W/ASSISTANCE Description STG Able To Perform Incision/Wound Care With Min Assistance.  Outcome: Progressing   Problem: RH SAFETY Goal: RH STG ADHERE TO SAFETY PRECAUTIONS W/ASSISTANCE/DEVICE Description STG Adhere to Safety Precautions With Min Assistance/Device.  Outcome: Progressing   Problem: RH PAIN MANAGEMENT Goal: RH STG PAIN MANAGED AT OR BELOW PT'S PAIN GOAL Description <3 out of 10.   Outcome: Progressing   

## 2018-06-20 NOTE — Progress Notes (Signed)
Occupational Therapy Session Note  Patient Details  Name: Meredith Gonzalez MRN: [id number] Date of Birth: 04/02/1935  Today's Date: 06/20/2018 OT Individual Time: 1415-1520 OT Individual Time Calculation (min): 65 min  10 missed minutes  Short Term Goals: Week 3:  OT Short Term Goal 1 (Week 3): STGs=LTGs secondary to upcoming discharge  Skilled Therapeutic Interventions/Progress Updates:    Upon entering the room, pt with no c/o pain. Pt requesting to go outside this session secondary to warmer temperatures. Pt ambulating with rollator to bathroom for toileting needs with overall supervision to complete tasks. Pt transferred into wheelchair and assisted outside for energy conservation. Once outside, OT continued to discuss home set up and preparations for discharge home. Pt verbalized feeling like she was ready for discharge. Pt ambulating 61' with rollator initially at supervision level. Pt then having an episode where she was unable to coordinate any movement, slow processing, and unable to weight shift. OT completely holding pt up as she leaned to the L into therapist. OT transferring pt into wheelchair with max A as she was unable to sequence movement of L side for transfer. OT returning pt to room and notifying RN who arrived to take vitals. 10 missed minutes secondary to medical concern.  Therapy Documentation Precautions:  Precautions Precautions: Fall Precaution Comments: many falls with Sept neck fracture Restrictions Weight Bearing Restrictions: No General: General OT Amount of Missed Time: 10 Minutes Vital Signs: Therapy Vitals Temp: 98.2 F (36.8 C) Temp Source: Oral Pulse Rate: 80 Resp: 20 BP: (!) 110/54 Patient Position (if appropriate): Standing Oxygen Therapy SpO2: 95 % O2 Device: Room Air Pain:   ADL: ADL Toileting: Contact guard Where Assessed-Toileting: Bedside Commode Toilet Transfer: Minimal assistance Toilet Transfer Method: Stand pivot Toilet Transfer  Equipment: Bedside commode   Therapy/Group: Individual Therapy  [name] 06/20/2018, 4:34 PM

## 2018-06-20 NOTE — Progress Notes (Signed)
Physical Therapy Session Note  Patient Details  Name: Meredith Gonzalez MRN: [id number] Date of Birth: 1935/11/04  Today's Date: 06/20/2018 PT Individual Time: 6962-9528 PT Individual Time Calculation (min): 60 min   Short Term Goals: Week 3:  PT Short Term Goal 1 (Week 3): STG=LTG due to ELOS  Skilled Therapeutic Interventions/Progress Updates:   Pt received sitting EOB and agreeable to PT. PT assisted pt in dressing EOB with supervision assist for safety. Due to neck pain and restricted hip movement PT assisted pt in to don socks and shoes as well as thread pants over feet. Sit<>stand from EOB x 3 for clothing management and supervision assist without AD.   Gait training with Rollator through hall with distant supervision assist from PT for safety.   Dynamic balance training while engaged in giant Connect 4 x 5 rounds. Supervision assist from PT while pt performed lateral reaches and trunk rotation to retrieve and place gmae pieces.   PT instructed pt in supine BLE strengthening exercises  SLR x8 with 4# ankle weight on the RLE, AROM on the L Bridges x 8  Heel slides x 8 BLE Reciprocal hip flexion x 10 BLE sidelying Clam shells x 10 BLE sidelying Hip abduction x 10 AAROM on the L and 4# ankle weight on the R x 8 Isometric lumbar rotation. X 5 BLE with 5 sec hold.   Sit<>stand x 5 with BUE push from mat table, supervision assist. X 5 pushing from knees, supervision assist. X 5 without UE support and CGA from PT for safety. Min cues for increased weight shift anteriorly.   Patient returned to room and left sitting in [facility] with call bell in reach and all needs met.          Therapy Documentation Precautions:  Precautions Precautions: Fall Precaution Comments: many falls with Sept neck fracture Restrictions Weight Bearing Restrictions: No Vital Signs: Oxygen Therapy SpO2: 96 % O2 Device: Room Air Pain: Pain Assessment Pain Scale: 0-10 Pain Score: 0-No  pain    Therapy/Group: Individual Therapy  Lorie Phenix 06/20/2018, 9:44 AM

## 2018-06-20 NOTE — Progress Notes (Signed)
Nurse called to pt's room due to patient having episode where left side went very weak and patient not acting right. Back at room patient alert and oriented x4. Neuro assessment done with no changes noted. Vitals stable. MD Kirstiens notified. New order for ortho vitals. Patient in chair resting. Continue to monitor.

## 2018-06-21 ENCOUNTER — Inpatient Hospital Stay (HOSPITAL_COMMUNITY): Payer: Medicare Other | Admitting: Occupational Therapy

## 2018-06-21 ENCOUNTER — Inpatient Hospital Stay (HOSPITAL_COMMUNITY): Payer: Medicare Other

## 2018-06-21 LAB — GLUCOSE, CAPILLARY
Glucose-Capillary: 87 mg/dL (ref 70–99)
Glucose-Capillary: 105 mg/dL — ABNORMAL HIGH (ref 70–99)
Glucose-Capillary: 93 mg/dL (ref 70–99)
Glucose-Capillary: 123 mg/dL — ABNORMAL HIGH (ref 70–99)

## 2018-06-21 MED ORDER — METHOCARBAMOL 500 MG PO TABS
250.0000 mg | ORAL_TABLET | Freq: Every day | ORAL | Status: DC
  Filled 2018-06-21: qty 1

## 2018-06-21 NOTE — Progress Notes (Signed)
Occupational Therapy Session Note  Patient Details  Name: Meredith Gonzalez MRN: [id number] Date of Birth: 10/25/1935  Today's Date: 06/21/2018 OT Individual Time: 6063-0160 Session 2: 1500-1520 OT Individual Time Calculation (min): 60 min Session 2: 20 min 10 min missed d/t pt fatigue   Short Term Goals: Week 3:  OT Short Term Goal 1 (Week 3): STGs=LTGs secondary to upcoming discharge  Skilled Therapeutic Interventions/Progress Updates:    Pt received supine with no c/o pain agreeable to shower. Pt used rollator and retrieved clothing/items for shower with (S). 1 cue for rollator positioning/management. Pt transferred into shower, doffing all clothing in standing with rollator support. Pt bathed, using LH sponge for distal LE with (S). Pt donned shirt with (S), use of reacher for LB dressing and was (S) throughout. Assist required to don ted hose, pt using sock aid at baseline. Pt stood at sink and completed oral care with (S). Pt performed other grooming tasks seated in rollator with good safety awareness and use of brakes. Pt completed 150 ft of functional mobility to therapy gym with (S). Pt completed brief B UE strengthening circuit with cueing for technique, using a 2 # ball. Discussed prior day's events with pt and reinforced MD recommendation that pt wear ted hose and avoid getting overheated. Pt returned to room and was left siting up in recliner with all needs met.   Session 2: Pt received sitting up in recliner with no c/o pain. Pt completed sit > stand transfer with mod I using rollator. Pt completed 150 ft of functional mobility in hallway before closing eyes and saying "I don't feel so good", with slight LOB. Pt had a seated rest break where all vitals were assessed and all WFL- BP 143/64. When questioned pt reported she "just felt funny" and requested to return to room. Pt returned to room with close CGA provided for safety. Pt was left sitting up in recliner and RN Caryl Pina was alerted to  pt complaints and entered room.   Therapy Documentation Precautions:  Precautions Precautions: Fall Precaution Comments: many falls with Sept neck fracture Restrictions Weight Bearing Restrictions: No Pain: Pain Assessment Pain Scale: 0-10 Pain Score: 4  Pain Type: Acute pain Pain Location: Head Pain Descriptors / Indicators: Aching Pain Frequency: Intermittent Pain Onset: On-going Pain Intervention(s): Rest Multiple Pain Sites: No   Therapy/Group: Individual Therapy  Curtis Sites 06/21/2018, 8:07 AM

## 2018-06-21 NOTE — Plan of Care (Signed)
  Problem: Consults Goal: RH GENERAL PATIENT EDUCATION Description See Patient Education module for education specifics. Outcome: Progressing   Problem: RH SKIN INTEGRITY Goal: RH STG SKIN FREE OF INFECTION/BREAKDOWN Description No new breakdown with min assist   Outcome: Progressing Goal: RH STG ABLE TO PERFORM INCISION/WOUND CARE W/ASSISTANCE Description STG Able To Perform Incision/Wound Care With Min Assistance.  Outcome: Progressing   Problem: RH SAFETY Goal: RH STG ADHERE TO SAFETY PRECAUTIONS W/ASSISTANCE/DEVICE Description STG Adhere to Safety Precautions With Min Assistance/Device.  Outcome: Progressing   Problem: RH PAIN MANAGEMENT Goal: RH STG PAIN MANAGED AT OR BELOW PT'S PAIN GOAL Description <3 out of 10.   Outcome: Progressing   

## 2018-06-21 NOTE — Progress Notes (Addendum)
[place] PHYSICAL MEDICINE & REHABILITATION PROGRESS NOTE   Subjective/Complaints: Episode of left leg become more weak while ambulating with therapy, order orthstatic vitals, systolic BP down from sit to stand but HR actually dropped sit to stand  ROS: Patient denies CP, SOB, N/V/D    Objective:   No results found. Recent Labs    06/20/18 0554  WBC 4.8  HGB 9.1*  HCT 31.5*  PLT 208   Recent Labs    06/19/18 1003  NA 139  K 4.6  CL 108  CO2 23  GLUCOSE 138*  BUN 19  CREATININE 0.83  CALCIUM 8.8*    Intake/Output Summary (Last 24 hours) at 06/21/2018 1023 Last data filed at 06/21/2018 0840 Gross per 24 hour  Intake 700 ml  Output -  Net 700 ml     Physical Exam: Vital Signs Blood pressure (!) 123/57, pulse 81, temperature 98.3 F (36.8 C), temperature source Oral, resp. rate 16, height 5\' 5"  (1.651 m), weight 62.5 kg, SpO2 96 %. Constitutional: No distress . Vital signs reviewed. HEENT: EOMI, oral membranes moist Neck: tender in the left suboccipital area Cardiovascular: RRR without murmur. No JVD    Respiratory: CTA Bilaterally without wheezes or rales. Normal effort    GI: BS +, non-tender, non-distended  Musculoskeletal:Normal range of motion.  Neurological: She isalertand oriented to person, place, and time. Sl ataxic speech.   LUE 4/5with impaired FMC. LLE grossly 4/5. RUE and RLE 5/5. No focal sensory findings are appreciated. .  +trunkal ataxia Mild dysmetria LLE Heel to shin  Skin: Skin iswarmand dry. She isnot diaphoretic.  Psychiatric: pleasant and cooperative ,     Assessment/Plan: 1. Functional deficits secondary to embolic right cortical infarct which require 3+ hours per day of interdisciplinary therapy in a comprehensive inpatient rehab setting.  Physiatrist is providing close team supervision and 24 hour management of active medical problems listed below.  Physiatrist and rehab team continue to assess barriers to  discharge/monitor patient progress toward functional and medical goals  Care Tool:  Bathing  Bathing activity did not occur: Refused Body parts bathed by patient: Right arm, Left arm, Chest, Abdomen, Right upper leg, Left upper leg, Front perineal area, Buttocks, Face, Left lower leg, Right lower leg   Body parts bathed by helper: Left lower leg, Right lower leg     Bathing assist Assist Level: Supervision/Verbal cueing     Upper Body Dressing/Undressing Upper body dressing Upper body dressing/undressing activity did not occur (including orthotics): Refused(on toilet for majority of eval) What is the patient wearing?: Pull over shirt    Upper body assist Assist Level: Set up assist    Lower Body Dressing/Undressing Lower body dressing    Lower body dressing activity did not occur: Refused(on toilet for majority of eval) What is the patient wearing?: Underwear/pull up, Pants     Lower body assist Assist for lower body dressing: Supervision/Verbal cueing     Toileting Toileting Toileting Activity did not occur (Clothing management and hygiene only): N/A (no void or bm)  Toileting assist Assist for toileting: Supervision/Verbal cueing     Transfers Chair/bed transfer  Transfers assist     Chair/bed transfer assist level: Supervision/Verbal cueing     Locomotion Ambulation   Ambulation assist      Assist level: Supervision/Verbal cueing Assistive device: Walker-rolling Max distance: 157ft   Walk 10 feet activity   Assist     Assist level: Supervision/Verbal cueing Assistive device: Walker-rolling   Walk 50 feet activity  Assist    Assist level: Supervision/Verbal cueing Assistive device: Walker-rolling    Walk 150 feet activity   Assist Walk 150 feet activity did not occur: Safety/medical concerns  Assist level: Supervision/Verbal cueing Assistive device: Walker-rolling    Walk 10 feet on uneven surface  activity   Assist      Assist level: Supervision/Verbal cueing Assistive device: Walker-rolling   Wheelchair     Assist   Type of Wheelchair: Manual    Wheelchair assist level: Supervision/Verbal cueing Max wheelchair distance: 133ft    Wheelchair 50 feet with 2 turns activity    Assist        Assist Level: Supervision/Verbal cueing    Wheelchair 150 feet activity     Assist     Assist Level: Supervision/Verbal cueing    Medical Problem List and Plan: 1.Functional and mobility deficitssecondary to embolic right cortical infarct --Continue CIR,episode of weakness was likely BP relatedPT, OT, SLP Has SVD noted in bilateral internal capsule and likely has flow related ischemia that is reversible Will need TED hose 2. Antithrombotics: -DVT/anticoagulation:Pharmaceutical:Other (comment)--Eliquis -antiplatelet therapy: N/A 3.Chronic neck pain/Pain Management:On gabapentin tid with robaxin changed to nightly as needed.  She will not discharged home on Robaxin, her son is concerned that she gets side effects with this medication and perhaps falls and also tends to overuse them. -no tramadol d/t intolerance  -added kpad prn 4. Mood:LCSW to follow for evaluation and support. -antipsychotic agents: N/A 5. Neuropsych: This patientiscapable of making decisions on herown behalf. 6. Skin/Wound Care:Routine pressure relief measures. 7. Fluids/Electrolytes/Nutrition:Monitor I/O.   I personally reviewed the patient's labs today.   -BUN trending up (26)---encourage fluids, held lasix I 959ml-BMET ok8. HTN: Monitor BP bid  -had been on low dose lasix (hold see #7). Vitals:   06/21/18 0549 06/21/18 0551  BP: 139/67 (!) 123/57  Pulse: 80 81  Resp:    Temp:    SpO2: 96%   controlled 3/29 but with some orthostatic symptoms thigh hi TED hose 9. T2DMwith peripheral neuropathy:Hgb A1c- 5.7. Diet controlled.  -controlled  3/15 10 A fib s/p TGY:BWLSLHT HR bid--continue amiodarone and Eliquis Don't think weakness episode is arrythmia related given PPM 11. DOE/Likely COPD: Recently started on Dulera.Resumed albuterol prn 12.R-CEA s/p CEA 3/11: monitor wound and dysphagia 13. Anemia: stable 3/25 -no gross blood loss  -recheck next week 14.  Emotional lability post CVA - trial sertraline 50mg  15.  C spine pain upper left facet most likely pain generator as noted on CTA neck ~3wk ago no new falls or trauma, no fever no WBC elevation. No radic or myelopathic finding on exam reviewed spine xrays, encourage use of robaxin, very sensitive to narcotics so will avoid,   Addendum spoke to son regarding episode of weakness yesterday.  We discussed possibility of using a medicine to increased blood pressure although that may cause excessive hypertension.  At this point decided to DC Robaxin a.m. doses, use thigh-high teds, maintain adequate hydration and avoid hot temperatures LOS: 16 days A FACE TO FACE EVALUATION WAS PERFORMED  Charlett Blake 06/21/2018, 10:23 AM

## 2018-06-21 NOTE — Progress Notes (Signed)
Per therapy patient had another episode of weakness and not feeling good after ambulation. Vitals taken. Ted hose on. Neuro status unchanged for a.m assessment. Patient reports feeling better now. MD Kirsteins notified. No new orders received. Continue with plan of care.

## 2018-06-21 NOTE — Progress Notes (Signed)
Slept good. PRN tylenol given this AM for C/O HA. Patrici Ranks A

## 2018-06-22 ENCOUNTER — Inpatient Hospital Stay (HOSPITAL_COMMUNITY): Payer: Medicare Other | Admitting: Physical Therapy

## 2018-06-22 ENCOUNTER — Inpatient Hospital Stay (HOSPITAL_COMMUNITY): Payer: Medicare Other | Admitting: Occupational Therapy

## 2018-06-22 LAB — GLUCOSE, CAPILLARY
Glucose-Capillary: 174 mg/dL — ABNORMAL HIGH (ref 70–99)
Glucose-Capillary: 79 mg/dL (ref 70–99)
Glucose-Capillary: 132 mg/dL — ABNORMAL HIGH (ref 70–99)
Glucose-Capillary: 118 mg/dL — ABNORMAL HIGH (ref 70–99)
Glucose-Capillary: 105 mg/dL — ABNORMAL HIGH (ref 70–99)

## 2018-06-22 LAB — BASIC METABOLIC PANEL
Anion gap: 6 (ref 5–15)
BUN: 18 mg/dL (ref 8–23)
CO2: 23 mmol/L (ref 22–32)
Calcium: 8.4 mg/dL — ABNORMAL LOW (ref 8.9–10.3)
Chloride: 110 mmol/L (ref 98–111)
Creatinine, Ser: 0.74 mg/dL (ref 0.44–1.00)
GFR calc Af Amer: >60 mL/min (ref >60)
GFR calc non Af Amer: >60 mL/min (ref >60)
Glucose, Bld: 89 mg/dL (ref 70–99)
Potassium: 4.2 mmol/L (ref 3.5–5.1)
Sodium: 139 mmol/L (ref 135–145)

## 2018-06-22 NOTE — Progress Notes (Signed)
Meredith Gonzalez PHYSICAL MEDICINE & REHABILITATION PROGRESS NOTE   Subjective/Complaints: Two spells over weekend with more weakness on left side where she felt that left leg "just gave out". OT concurred with pt's report  ROS: Patient denies fever, rash, sore throat, blurred vision, nausea, vomiting, diarrhea, cough, shortness of breath or chest pain, joint or back pain, headache, or mood change.    Objective:   No results found. Recent Labs    06/20/18 0554  WBC 4.8  HGB 9.1*  HCT 31.5*  PLT 208   Recent Labs    06/22/18 0610  NA 139  K 4.2  CL 110  CO2 23  GLUCOSE 89  BUN 18  CREATININE 0.74  CALCIUM 8.4*    Intake/Output Summary (Last 24 hours) at 06/22/2018 1011 Last data filed at 06/22/2018 0600 Gross per 24 hour  Intake 480 ml  Output -  Net 480 ml     Physical Exam: Vital Signs Blood pressure 133/64, pulse 79, temperature 98.1 F (36.7 C), temperature source Oral, resp. rate 16, height 5\' 5"  (1.651 m), weight 62.5 kg, SpO2 98 %. Constitutional: No distress . Vital signs reviewed. HEENT: EOMI, oral membranes moist Neck: supple Cardiovascular: RRR without murmur. No JVD    Respiratory: CTA Bilaterally without wheezes or rales. Normal effort    GI: BS +, non-tender, non-distended  Musculoskeletal:Normal range of motion.  Neurological: She isalertand oriented to person, place, and time. Sl ataxic speech.   LUE 4/5with impaired FMC. LLE grossly 4/5. RUE and RLE 5/5. --motor exam appears unchanged with 4/5 strength on left. +pd    Mild dysmetria LUE   Skin: Skin iswarmand dry. She isnot diaphoretic.  Psychiatric: pleasant,     Assessment/Plan: 1. Functional deficits secondary to embolic right cortical infarct which require 3+ hours per day of interdisciplinary therapy in a comprehensive inpatient rehab setting.  Physiatrist is providing close team supervision and 24 hour management of active medical problems listed below.  Physiatrist and rehab  team continue to assess barriers to discharge/monitor patient progress toward functional and medical goals  Care Tool:  Bathing  Bathing activity did not occur: Refused Body parts bathed by patient: Right arm, Left arm, Chest, Abdomen, Right upper leg, Left upper leg, Front perineal area, Buttocks, Face, Left lower leg, Right lower leg   Body parts bathed by helper: Left lower leg, Right lower leg     Bathing assist Assist Level: Supervision/Verbal cueing     Upper Body Dressing/Undressing Upper body dressing Upper body dressing/undressing activity did not occur (including orthotics): Refused(on toilet for majority of eval) What is the patient wearing?: Pull over shirt    Upper body assist Assist Level: Set up assist    Lower Body Dressing/Undressing Lower body dressing    Lower body dressing activity did not occur: Refused(on toilet for majority of eval) What is the patient wearing?: Underwear/pull up, Pants     Lower body assist Assist for lower body dressing: Supervision/Verbal cueing Assistive Device Comment: reacher   Toileting Toileting Toileting Activity did not occur (Clothing management and hygiene only): N/A (no void or bm)  Toileting assist Assist for toileting: Supervision/Verbal cueing     Transfers Chair/bed transfer  Transfers assist     Chair/bed transfer assist level: Supervision/Verbal cueing     Locomotion Ambulation   Ambulation assist      Assist level: Supervision/Verbal cueing Assistive device: Walker-rolling Max distance: 173ft   Walk 10 feet activity   Assist     Assist level:  Supervision/Verbal cueing Assistive device: Walker-rolling   Walk 50 feet activity   Assist    Assist level: Supervision/Verbal cueing Assistive device: Walker-rolling    Walk 150 feet activity   Assist Walk 150 feet activity did not occur: Safety/medical concerns  Assist level: Supervision/Verbal cueing Assistive device: Walker-rolling     Walk 10 feet on uneven surface  activity   Assist     Assist level: Supervision/Verbal cueing Assistive device: Walker-rolling   Wheelchair     Assist   Type of Wheelchair: Manual    Wheelchair assist level: Supervision/Verbal cueing Max wheelchair distance: 162ft    Wheelchair 50 feet with 2 turns activity    Assist        Assist Level: Supervision/Verbal cueing    Wheelchair 150 feet activity     Assist     Assist Level: Supervision/Verbal cueing    Medical Problem List and Plan: 1.Functional and mobility deficitssecondary to embolic right cortical infarct -Continue CIR therapies including PT, OT, and SLP   -bp readings have been adequate. No orthostasis reported by therapy  -may be med related---dc robaxin  -BMET better today, has been hypothyroid--check tomorrow  -continue TEDS, push fluids 2. Antithrombotics: -DVT/anticoagulation:Pharmaceutical:Other (comment)--Eliquis -antiplatelet therapy: N/A 3.Chronic neck pain/Pain Management:On gabapentin tid   -dc robaxin as above. Advised use of her kpad for spasms -no tramadol d/t intolerance    4. Mood:LCSW to follow for evaluation and support. -antipsychotic agents: N/A 5. Neuropsych: This patientiscapable of making decisions on herown behalf. 6. Skin/Wound Care:Routine pressure relief measures. 7. Fluids/Electrolytes/Nutrition:encourage PO fluids   I personally reviewed the patient's labs today.    -BUN improved to 18 today   8. HTN: Monitor BP bid  -had been on low dose lasix (held see #7). Vitals:   06/22/18 0404 06/22/18 0836  BP: 133/64   Pulse: 79   Resp: 16   Temp: 98.1 F (36.7 C)   SpO2: 93% 98%  controlled 3/30. No orthostasis reported 9. T2DMwith peripheral neuropathy:Hgb A1c- 5.7. Diet controlled.  -controlled 3/30 10 A fib s/p BZJ:IRCVELF HR bid--continue amiodarone and Eliquis Don't think weakness  episode is arrythmia related given PPM 11. DOE/Likely COPD: Recently started on Dulera.Resumed albuterol prn 12.R-CEA s/p CEA 3/11: wound stable 13. Anemia: stable 3/25 -no gross blood loss  -recheck next week 14.  Emotional lability post CVA - trial sertraline 50mg  15.  C spine pain upper left facet most likely pain generator as noted on CTA neck ~3wk ago no new falls or trauma, no fever no WBC elevation. No radic or myelopathic finding on exam---utilize heat, rom/exercises with PT,       LOS: 17 days A FACE TO [place] 06/22/2018, 10:11 AM

## 2018-06-22 NOTE — Discharge Instructions (Addendum)
Inpatient Rehab Discharge Instructions   Activity: no lifting, driving, or strenuous exercise till cleared at home Diet: cardiac diet and diabetic diet Wound Care: keep wound clean and dry Contact MD if you develop any problems with your incision/wound--redness, swelling, increase in pain, drainage or if you develop fever or chills.    Functional status:  ___ No restrictions     ___ Walk up steps independently _X__ 24/7 supervision/assistance   ___ Walk up steps with assistance ___ Intermittent supervision/assistance  ___ Bathe/dress independently _X__ Walk with walker    ___ Bathe/dress with assistance ___ Walk Independently    ___ Shower independently ___ Walk with assistance    ___ Shower with assistance _X__ No alcohol     ___ Return to work/school ________  Special Instructions:   My questions have been answered and I understand these instructions. I will adhere to these goals and the provided educational materials after my discharge from the hospital.  Patient/Caregiver Signature _______________________________ Date __________  Clinician Signature _______________________________________ Date __________  Please bring this form and your medication list with you to all your follow-up doctor's appointments.    My questions have been answered and I understand these instructions. I will adhere to these goals and the provided educational materials after my discharge from the hospital.  Patient/Caregiver Signature _______________________________ Date __________  Clinician Signature _______________________________________ Date __________  Please bring this form and your medication list with you to all your follow-up doctor's appointments. Information on my medicine - ELIQUIS (apixaban)  This medication education was reviewed with me or my healthcare representative as part of my discharge preparation.  The pharmacist that spoke with me during my hospital stay was:  Onnie Boer,  RPH-CPP  Why was Eliquis prescribed for you? Eliquis was prescribed for you to reduce the risk of a blood clot forming that can cause a stroke if you have a medical condition called atrial fibrillation (a type of irregular heartbeat).  What do You need to know about Eliquis ? Take your Eliquis TWICE DAILY - one tablet in the morning and one tablet in the evening with or without food. If you have difficulty swallowing the tablet whole please discuss with your pharmacist how to take the medication safely.  Take Eliquis exactly as prescribed by your doctor and DO NOT stop taking Eliquis without talking to the doctor who prescribed the medication.  Stopping may increase your risk of developing a stroke.  Refill your prescription before you run out.  After discharge, you should have regular check-up appointments with your healthcare provider that is prescribing your Eliquis.  In the future your dose may need to be changed if your kidney function or weight changes by a significant amount or as you get older.  What do you do if you miss a dose? If you miss a dose, take it as soon as you remember on the same day and resume taking twice daily.  Do not take more than one dose of ELIQUIS at the same time to make up a missed dose.  Important Safety Information A possible side effect of Eliquis is bleeding. You should call your healthcare provider right away if you experience any of the following: ? Bleeding from an injury or your nose that does not stop. ? Unusual colored urine (red or dark brown) or unusual colored stools (red or black). ? Unusual bruising for unknown reasons. ? A serious fall or if you hit your head (even if there is no bleeding).  Some medicines  may interact with Eliquis and might increase your risk of bleeding or clotting while on Eliquis. To help avoid this, consult your healthcare provider or pharmacist prior to using any new prescription or non-prescription medications,  including herbals, vitamins, non-steroidal anti-inflammatory drugs (NSAIDs) and supplements.  This website has more information on Eliquis (apixaban): http://www.eliquis.com/eliquis/home     COMMUNITY REFERRALS UPON DISCHARGE:    Home Health:   PT & OT   [facility]   [place]   Date of last service:06/24/2018  Medical Equipment/Items Ordered:HAS ALL NEEDED EQUIPMENT     GENERAL COMMUNITY RESOURCES FOR PATIENT/FAMILY: Support Groups:[facility] SUPPORT GROUP  THE SECOND Thursday OF EACH MONTH @ 6:00-7:00 PM ON THE REHAB UNIT QUESTIONS CONTACT AMY [phone]

## 2018-06-22 NOTE — Progress Notes (Signed)
Physical Therapy Session Note  Patient Details  Name: Meredith Gonzalez MRN: [id number] Date of Birth: 27-Feb-1936  Today's Date: 06/22/2018 PT Individual Time: 0900-1000 and 1330-1430 PT Individual Time Calculation (min): 60 min and 60 min  Short Term Goals: Week 3:  PT Short Term Goal 1 (Week 3): STG=LTG due to ELOS  Skilled Therapeutic Interventions/Progress Updates: Pt presented in recliner agreeable to therapy. Pt indicating feeling better today than over w/e. Pt transferred to standing with rollator with supervision and ambulated throughout unit to x 2 nsg stations looking for a newspaper. Pt noted to ambulate more fluid  And with good posture as this therapist observed last week. Pt ambulated from [place] nsg station to Day room stopping and speaking with LSW en route with supervision and no increased sway nor LOB. Pt participated in Wii game activities including balance tilt table game on Wii Fit no AD. Pt able to perform varying levels with improved hip and ankle strategies however most difficult with shifting wt anteriorly. Pt also participated in Wii bowling while standing on [employer]. Pt was able to participated in 10 frames without seated rest and without LOB. Pt ambulated to rehab gym and participated in x 2 games of horseshoes with contralateral LE placed on Dyna disk. Pt initially required significant guarding with pt reaching for PTA's pants however with cues was able to hold balance and complete tasks with min guard.Pt ambulated back to room at end of session and returned to recliner. Pt left with call bell within reach and needs met.    Tx2:  Pt presented in recliner agreeable to therapy. Session focused on community ambulation throughout hospital and outside. Pt used rollator to ambulate to atrium and entrance of [place] all performed at supervision level. Pt also ambulated outside with rollator on patio intially CGA then progressed to supervision. Pt ambulated on slight up/down grades and  demonstrated good safety awareness with rollator. As pt ambulated back inside hospital and to elevators pt began to demonstrate increased forward flexed posture and decreased foot clearance, pt verbalized need to rest and took short break sitting on rollator. Discussed with pt need to energy conservation and to take appropriate breaks when needed with pt verbalizing understanding. Pt ambulated back to unit after rest with improved form and participated in NuStep L3 x 13 min for global conditioning. Pt then ambulated back to room and returned to recliner. Pt left with call button within reach and needs met.      Therapy Documentation Precautions:  Precautions Precautions: Fall Precaution Comments: many falls with Sept neck fracture Restrictions Weight Bearing Restrictions: No General:   Vital Signs: Therapy Vitals Pulse Rate: 80 Resp: 17 BP: (!) 111/57 Patient Position (if appropriate): Sitting Oxygen Therapy SpO2: 97 % O2 Device: Room Air Pain: Pain Assessment Pain Scale: 0-10 Pain Score: 6  Pain Location: Head Pain Orientation: Left Patients Stated Pain Goal: 3 Pain Intervention(s): Medication (See eMAR)   Therapy/Group: Individual Therapy  Yacine Garriga  Christerpher Clos, PTA  06/22/2018, 3:05 PM

## 2018-06-22 NOTE — Progress Notes (Signed)
Occupational Therapy Session Note  Patient Details  Name: Meredith Gonzalez MRN: [id number] Date of Birth: 1935-10-11  Today's Date: 06/22/2018 OT Individual Time: 0705-0830 OT Individual Time Calculation (min): 85 min    Short Term Goals: Week 3:  OT Short Term Goal 1 (Week 3): STGs=LTGs secondary to upcoming discharge  Skilled Therapeutic Interventions/Progress Updates:    Upon entering the room, pt supine in bed with no c/o pain and reports, " I feel better than I did yesterday when I had another little episode." Pt agreeable to OT intervention. Pt ambulated with rollator to bathroom for toileting needs with overall distant supervision. Pt returning to dresser with rollator and utilizing reacher to obtain needed items from lower dresser drawer. Pt donning clothing items with sit <>stand on rollator seat as she typically does at home. Pt donning UB clothing independently and obtaining LB clothing with use of AE as needed and supervision for safety. Pt demonstrated good safety awareness by locking rollator for safety when needed without cuing needed. Pt ambulating into bathroom with rollator to obtain dirty clothing from yesterday with supervision and pt standing at sink for grooming tasks at mod I overall. Pt returning to sit in recliner chair and demonstrated B UE HEP with min verbal cuing to utilize paper handout for proper technique. Pt remaining in recliner chair at end of session with call bell and all needed items within reach. K pad applied to neck per pt request.   Therapy Documentation Precautions:  Precautions Precautions: Fall Precaution Comments: many falls with Sept neck fracture Restrictions Weight Bearing Restrictions: No Vital Signs: Oxygen Therapy SpO2: 98 % O2 Device: Room Air ADL: ADL Toileting: Contact guard Where Assessed-Toileting: Bedside Commode Toilet Transfer: Minimal assistance Toilet Transfer Method: Stand pivot Toilet Transfer Equipment: Bedside  commode   Therapy/Group: Individual Therapy  [name] 06/22/2018, 11:43 AM

## 2018-06-22 NOTE — Progress Notes (Signed)
Slept good. PRN tylenol given at HS for C/O HA. Calls for assistance to BR. Meredith Gonzalez

## 2018-06-23 ENCOUNTER — Inpatient Hospital Stay (HOSPITAL_COMMUNITY): Payer: Medicare Other | Admitting: Physical Therapy

## 2018-06-23 ENCOUNTER — Inpatient Hospital Stay (HOSPITAL_COMMUNITY): Payer: Medicare Other | Admitting: Occupational Therapy

## 2018-06-23 DIAGNOSIS — E039 Hypothyroidism, unspecified: Secondary | ICD-10-CM

## 2018-06-23 DIAGNOSIS — I482 Chronic atrial fibrillation, unspecified: Secondary | ICD-10-CM

## 2018-06-23 LAB — CBC
HCT: 31.8 % — ABNORMAL LOW (ref 36.0–46.0)
HEMOGLOBIN: 9.7 g/dL — AB (ref 12.0–15.0)
MCH: 31.9 pg (ref 26.0–34.0)
MCHC: 30.5 g/dL (ref 30.0–36.0)
MCV: 104.6 fL — ABNORMAL HIGH (ref 80.0–100.0)
Platelets: 194 10*3/uL (ref 150–400)
RBC: 3.04 MIL/uL — AB (ref 3.87–5.11)
RDW: 18.6 % — ABNORMAL HIGH (ref 11.5–15.5)
WBC: 5.1 10*3/uL (ref 4.0–10.5)
nRBC: 0 % (ref 0.0–0.2)

## 2018-06-23 LAB — GLUCOSE, CAPILLARY
Glucose-Capillary: 82 mg/dL (ref 70–99)
Glucose-Capillary: 148 mg/dL — ABNORMAL HIGH (ref 70–99)
Glucose-Capillary: 138 mg/dL — ABNORMAL HIGH (ref 70–99)

## 2018-06-23 LAB — T4, FREE: Free T4: 0.96 ng/dL (ref 0.82–1.77)

## 2018-06-23 LAB — TSH: TSH: 10.821 u[IU]/mL — ABNORMAL HIGH (ref 0.350–4.500)

## 2018-06-23 MED ORDER — LEVOTHYROXINE SODIUM 75 MCG PO TABS
75.0000 ug | ORAL_TABLET | Freq: Every day | ORAL | Status: DC
  Administered 2018-06-24: 75 ug via ORAL
  Filled 2018-06-23: qty 1

## 2018-06-23 NOTE — Progress Notes (Signed)
Social Work  Discharge Note  The overall goal for the admission was met for:   Discharge location: Yes-HOME WITH INTERMITTENT ASSIST FROM SON-KEVIN  Length of Stay: Yes-19 DAYS  Discharge activity level: Yes-MOD/I LEVEL  Home/community participation: Yes  Services provided included: MD, RD, PT, OT, RN, CM, TR, Pharmacy and SW  Financial Services: Medicare and Private Insurance: [place]  Follow-up services arranged: Home Health: [facility]-PT & OT and Patient/Family request agency HH: [place], DME: NO NEEDS  Comments (or additional information):KEVIN SON WILL CHECK ON PT DAILY AND PT MET MOD/I LEVEL AND FEELS COMFORTABLE AND READY TO [place].  Patient/Family verbalized understanding of follow-up arrangements: Yes  Individual responsible for coordination of the follow-up plan: SELF & KEVIN-SON  Confirmed correct DME delivered: Elease Hashimoto 06/23/2018    Elease Hashimoto

## 2018-06-23 NOTE — Progress Notes (Signed)
Occupational Therapy Discharge Summary  Patient Details  Name: Meredith Gonzalez MRN: [id number] Date of Birth: [birth year]    Patient has met 12 of 12 long term goals due to improved activity tolerance, improved balance, ability to compensate for deficits, improved awareness and improved coordination.  Patient to discharge at overall Modified Independent level with use of equipment as needed for safety.  Her son is very attentive and will be assisting with IADL tasks as needed initially when returning home.   Reasons goals not met: all goals met  Recommendation:  Patient will benefit from ongoing skilled OT services in home health setting to continue to advance functional skills in the area of BADL and iADL.B UE HEP given as well with use of theraband. Fall prevention handout given and extensive energy conservation education completed during OT intervention.   Equipment: No equipment provided  Reasons for discharge: treatment goals met  Patient/family agrees with progress made and goals achieved: Yes  OT Discharge Precautions/Restrictions  Precautions Precautions: Fall Precaution Comments: many falls with Sept neck fracture Restrictions Weight Bearing Restrictions: No Pain Pain Assessment Pain Scale: 0-10 Pain Score: 0-No pain ADL ADL Toileting: Contact guard Where Assessed-Toileting: Bedside Commode Toilet Transfer: Minimal assistance Toilet Transfer Method: Stand pivot Science writer: Bedside commode Vision Baseline Vision/History: Wears glasses Wears Glasses: Reading only Patient Visual Report: No change from baseline Cognition Overall Cognitive Status: Within Functional Limits for tasks assessed Arousal/Alertness: Awake/alert Orientation Level: Oriented X4 Sensation Sensation Light Touch: Appears Intact Coordination Gross Motor Movements are Fluid and Coordinated: Yes Fine Motor Movements are Fluid and Coordinated: Yes Motor  Motor Motor:  Hemiplegia;Ataxia Motor - Discharge Observations: generalized weakness with improvement in ataxia and strengthening Mobility  Bed Mobility Bed Mobility: Rolling Right;Rolling Left;Supine to Sit;Sit to Supine Rolling Right: Independent Rolling Left: Independent Supine to Sit: Independent Sit to Supine: Independent Transfers Sit to Stand: Independent with assistive device Stand to Sit: Independent with assistive device  Trunk/Postural Assessment  Cervical Assessment Cervical Assessment: Exceptions to [facility] Cervical AROM Overall Cervical AROM Comments: limited AROM due to recent cervical fracture 11/2017 Thoracic Assessment Thoracic Assessment: Exceptions to WFL(kyphotic) Lumbar Assessment Lumbar Assessment: Exceptions to WFL(posterior pelvic tilt) Postural Control Postural Control: Deficits on evaluation Righting Reactions: delayed Protective Responses: delayed  Balance Balance Balance Assessed: Yes Static Sitting Balance Static Sitting - Level of Assistance: 6: Modified independent (Device/Increase time) Dynamic Sitting Balance Dynamic Sitting - Level of Assistance: 6: Modified independent (Device/Increase time) Static Standing Balance Static Standing - Level of Assistance: 6: Modified independent (Device/Increase time) Dynamic Standing Balance Dynamic Standing - Level of Assistance: 6: Modified independent (Device/Increase time) Extremity/Trunk Assessment RUE Assessment RUE Assessment: Within Functional Limits LUE Assessment LUE Assessment: Within Functional Limits   [name] 06/23/2018, 7:40 AM

## 2018-06-23 NOTE — Progress Notes (Signed)
Occupational Therapy Session Note  Patient Details  Name: Meredith Gonzalez MRN: [id number] Date of Birth: 06-Feb-1936  Today's Date: 06/23/2018 OT Individual Time: 1350-1500 OT Individual Time Calculation (min): 70 min    Short Term Goals: Week 2:  OT Short Term Goal 1 (Week 2): STGs=LTGs secondary to upcoming discharge  Skilled Therapeutic Interventions/Progress Updates:    Treatment session with focus on activity tolerance, dynamic standing balance, and floor transfers.  Pt received upright in recliner reporting pleased with progress and ready for d/c home.  Pt ambulated to therapy gym with Rollator at Mod I level.  Educated on fall risk and fall recovery.  Pt reports typical routine if/when she falls at home.  Pt completed floor transfer x2 requiring min/CGA on initial floor transfer due to instability.  However pt able to complete 2nd attempt with supervision.  Ambulated to Dayroom and engaged in Wii bowling activity with focus on standing balance and tolerance.  Pt with 1 LOB requiring min assist to correct.  Discussed energy conservation strategies and use of seated activities and seated rest breaks to decrease fall risk.  Pt returned to room and left upright in recliner with all needs in reach.  Therapy Documentation Precautions:  Precautions Precautions: Fall Precaution Comments: many falls with Sept neck fracture Restrictions Weight Bearing Restrictions: No Pain:  Pt with no c/o pain   Therapy/Group: Individual Therapy  Simonne Come 06/23/2018, 3:21 PM

## 2018-06-23 NOTE — Progress Notes (Signed)
Physical Therapy Session Note  Patient Details  Name: Meredith Gonzalez MRN: [id number] Date of Birth: [birth year]  Today's Date: 06/23/2018 PT Individual Time: 0910-1010 PT Individual Time Calculation (min): 60 min   Short Term Goals: Week 3:  PT Short Term Goal 1 (Week 3): STG=LTG due to ELOS  Skilled Therapeutic Interventions/Progress Updates: Pt presented in recliner agreeable to therapy. Pt denies pain at present. Pt ambulated throughout unit with rollator distant S to mod I demonstrating good safety, posture, and cadence. Pt participated in grad day activities including car transfer, stairs, and gait on uneven surfaces (see care tool). Pt also participated in [employer] with an improved score of 38/58 which although continues to demonstrate and increased fall risk was an improvement from initial score of 24/56. Pt provided with fall prevention HEP and provided instructions on progression of activities. Pt ambulated back to room and returned to recliner in same manner as prior. Pt left in recliner with call bell within reach and needs met.      Therapy Documentation Precautions:  Precautions Precautions: Fall Precaution Comments: many falls with Sept neck fracture Restrictions Weight Bearing Restrictions: No General:   Vital Signs: Oxygen Therapy SpO2: 97 % O2 Device: Room Air O2 Flow Rate (L/min): 0 L/min Pain:   Balance Balance Assessed: Yes Standardized Balance Assessment Standardized Balance Assessment: Berg Balance Test Berg Balance Test Sit to Stand: Able to stand  independently using hands Standing Unsupported: Able to stand safely 2 minutes Sitting with Back Unsupported but Feet Supported on Floor or Stool: Able to sit safely and securely 2 minutes Stand to Sit: Controls descent by using hands Transfers: Able to transfer safely, definite need of hands Standing Unsupported with Eyes Closed: Able to stand 10 seconds safely Standing Ubsupported with Feet Together:  Able to place feet together independently and stand 1 minute safely From Standing, Reach Forward with Outstretched Arm: Can reach forward >12 cm safely (5") From Standing Position, Pick up Object from Floor: Able to pick up shoe, needs supervision From Standing Position, Turn to Look Behind Over each Shoulder: Turn sideways only but maintains balance Turn 360 Degrees: Able to turn 360 degrees safely but slowly Standing Unsupported, Alternately Place Feet on Step/Stool: Able to complete >2 steps/needs minimal assist Standing Unsupported, One Foot in Front: Able to take small step independently and hold 30 seconds Standing on One Leg: Unable to try or needs assist to prevent fall Total Score: 38 Static Sitting Balance Static Sitting - Level of Assistance: 6: Modified independent (Device/Increase time) Dynamic Sitting Balance Dynamic Sitting - Level of Assistance: 6: Modified independent (Device/Increase time) Static Standing Balance Static Standing - Level of Assistance: 6: Modified independent (Device/Increase time) Dynamic Standing Balance Dynamic Standing - Level of Assistance: 6: Modified independent (Device/Increase time)    Therapy/Group: Individual Therapy  Delberta Folts  Tyriek Hofman, PTA  06/23/2018, 12:50 PM

## 2018-06-23 NOTE — Progress Notes (Signed)
[facility] PHYSICAL MEDICINE & REHABILITATION PROGRESS NOTE   Subjective/Complaints: Pt had a better day yesterday. No weakness or episodes where she felt that her left side was going to give away  ROS: Patient denies fever, rash, sore throat, blurred vision, nausea, vomiting, diarrhea, cough, shortness of breath or chest pain, joint or back pain, headache, or mood change.    Objective:   No results found. Recent Labs    06/23/18 0723  WBC 5.1  HGB 9.7*  HCT 31.8*  PLT 194   Recent Labs    06/22/18 0610  NA 139  K 4.2  CL 110  CO2 23  GLUCOSE 89  BUN 18  CREATININE 0.74  CALCIUM 8.4*    Intake/Output Summary (Last 24 hours) at 06/23/2018 0908 Last data filed at 06/22/2018 1830 Gross per 24 hour  Intake 320 ml  Output -  Net 320 ml     Physical Exam: Vital Signs Blood pressure 134/67, pulse 80, temperature 97.9 F (36.6 C), temperature source Oral, resp. rate 18, height 5\' 5"  (1.651 m), weight 63 kg, SpO2 97 %. General: No acute distress HEENT: EOMI, oral membranes moist Cards: reg rate  Chest: normal effort Abdomen: Soft, NT, ND Skin: dry, intact Extremities: no edema wheezes or rales. Normal effort    GI: BS +, non-tender, non-distended  Musculoskeletal:Normal range of motion.  Neurological: She isalertand oriented to person, place, and time. Sl ataxic speech.   LUE 4/5with impaired FMC. LLE grossly 4/5. RUE and RLE 5/5. --motor exam appears unchanged with 4/5 strength on left.     Mild dysmetria LUE   Skin: Skin iswarmand dry. She isnot diaphoretic.  Psychiatric: pleasant and cooperative     Assessment/Plan: 1. Functional deficits secondary to embolic right cortical infarct which require 3+ hours per day of interdisciplinary therapy in a comprehensive inpatient rehab setting.  Physiatrist is providing close team supervision and 24 hour management of active medical problems listed below.  Physiatrist and rehab team continue to assess  barriers to discharge/monitor patient progress toward functional and medical goals  Care Tool:  Bathing  Bathing activity did not occur: Refused Body parts bathed by patient: Right arm, Left arm, Chest, Abdomen, Right upper leg, Left upper leg, Front perineal area, Buttocks, Face, Left lower leg, Right lower leg   Body parts bathed by helper: Left lower leg, Right lower leg     Bathing assist Assist Level: Independent with assistive device     Upper Body Dressing/Undressing Upper body dressing Upper body dressing/undressing activity did not occur (including orthotics): Refused(on toilet for majority of eval) What is the patient wearing?: Pull over shirt    Upper body assist Assist Level: Independent    Lower Body Dressing/Undressing Lower body dressing    Lower body dressing activity did not occur: Refused(on toilet for majority of eval) What is the patient wearing?: Underwear/pull up, Pants     Lower body assist Assist for lower body dressing: Independent with assitive device Assistive Device Comment: reacher   Toileting Toileting Toileting Activity did not occur (Clothing management and hygiene only): N/A (no void or bm)  Toileting assist Assist for toileting: Independent with assistive device     Transfers Chair/bed transfer  Transfers assist     Chair/bed transfer assist level: Independent with assistive device     Locomotion Ambulation   Ambulation assist      Assist level: Supervision/Verbal cueing Assistive device: Rollator Max distance: 317ft   Walk 10 feet activity   Assist  Assist level: Supervision/Verbal cueing Assistive device: Rollator   Walk 50 feet activity   Assist    Assist level: Supervision/Verbal cueing Assistive device: Rollator    Walk 150 feet activity   Assist Walk 150 feet activity did not occur: Safety/medical concerns  Assist level: Supervision/Verbal cueing Assistive device: Rollator    Walk 10 feet on  uneven surface  activity   Assist     Assist level: Contact Guard/Touching assist Assistive device: Rollator   Wheelchair     Assist   Type of Wheelchair: Manual    Wheelchair assist level: Supervision/Verbal cueing Max wheelchair distance: 139ft    Wheelchair 50 feet with 2 turns activity    Assist        Assist Level: Supervision/Verbal cueing    Wheelchair 150 feet activity     Assist     Assist Level: Supervision/Verbal cueing    Medical Problem List and Plan: 1.Functional and mobility deficitssecondary to embolic right cortical infarct -Continue CIR therapies including PT, OT, and SLP   -bp readings have been adequate. No orthostasis reported by therapy  -likely related to robaxin  -BMET essentially normal 3/30  -TSH still high, Free T4 low nl, will increase to 34mcg tablet  -continue TEDS, push fluids 2. Antithrombotics: -DVT/anticoagulation:Pharmaceutical:Other (comment)--Eliquis -antiplatelet therapy: N/A 3.Chronic neck pain/Pain Management:On gabapentin tid   -dc robaxin as above. Advised use of her kpad for spasms -no tramadol d/t intolerance   -not a candidate for muscle relaxants right now 4. Mood:LCSW to follow for evaluation and support. -antipsychotic agents: N/A 5. Neuropsych: This patientiscapable of making decisions on herown behalf. 6. Skin/Wound Care:Routine pressure relief measures. 7. Fluids/Electrolytes/Nutrition:encourage PO fluids   I personally reviewed the patient's labs today.    -BUN improved    8. HTN: Monitor BP bid  -had been on low dose lasix (held see #7). Vitals:   06/23/18 0317 06/23/18 0905  BP: 134/67   Pulse: 80   Resp: 18   Temp: 97.9 F (36.6 C)   SpO2: 94% 97%  controlled 3/31. No orthostasis reported 9. T2DMwith peripheral neuropathy:Hgb A1c- 5.7. Diet controlled.  -controlled 3/30 10 A fib s/p FEO:[id number] HR bid--continue  amiodarone and Eliquis   11. DOE/Likely COPD: Recently started on Dulera.Resumed albuterol prn 12.R-CEA s/p CEA 3/11: wound stable, clean 13. Anemia: stable 3/25 -no gross blood loss  -recheck hgb up to 9.7 3/31 14.  Emotional lability post CVA - trial sertraline 50mg  15.  C spine pain upper left facet most likely pain generator as noted on CTA neck ~3wk ago no new falls or trauma, no fever no WBC elevation. No radic or myelopathic finding on exam---utilize heat, rom/exercises with PT,       LOS: 18 days A FACE TO [place] 06/23/2018, 9:08 AM

## 2018-06-23 NOTE — Progress Notes (Signed)
Occupational Therapy Session Note  Patient Details  Name: Meredith Gonzalez MRN: [id number] Date of Birth: 08/30/1935  Today's Date: 06/23/2018 OT Individual Time: 6962-9528 OT Individual Time Calculation (min): 70 min    Short Term Goals: Week 3:  OT Short Term Goal 1 (Week 3): STGs=LTGs secondary to upcoming discharge  Skilled Therapeutic Interventions/Progress Updates:    Upon entering the room, pt supine in bed with no c/o pain and agreeable to OT intervention. OT reviewed goals for discharge and expectations for session with pt verbalizing understanding. Pt utilized rollator to obtain needed clothing items for shower and pt performed toileting needs, doffed clothing while seated on commode,and then seated on TTB for bathing all at mod I level. Pt engaged in sit <>stand from rollator seat with use of reacher and sock aide to don LB clothing items at mod I level overall. Pt standing at sink for grooming tasks and then returning to sit in recliner chair to rest secondary to fatigue. OT reviewed instructions/HEP for discharge and pt verbalized understanding. No further concerns at this time. Pt remained in recliner chair with call bell and all needed items within reach upon exiting the room.   Therapy Documentation Precautions:  Precautions Precautions: Fall Precaution Comments: many falls with Sept neck fracture Restrictions Weight Bearing Restrictions: No General:   Vital Signs: Oxygen Therapy SpO2: 97 % O2 Device: Room Air O2 Flow Rate (L/min): 0 L/min ADL: ADL Toileting: Contact guard Where Assessed-Toileting: Bedside Commode Toilet Transfer: Minimal assistance Toilet Transfer Method: Stand pivot Toilet Transfer Equipment: Bedside commode   Therapy/Group: Individual Therapy  [name] 06/23/2018, 11:45 AM

## 2018-06-23 NOTE — Progress Notes (Signed)
Physical Therapy Discharge Summary  Patient Details  Name: Meredith Gonzalez MRN: [id number] Date of Birth: Dec 10, 1935  Today's Date: 06/23/2018      Patient has met 8 of 8 long term goals due to improved activity tolerance, improved balance, improved postural control, increased strength, improved attention, improved awareness and improved coordination.  Pt has demonstrated overall improvements in balance, coordination, and has demonstrated safety with use of rollator both in a controlled environment and outside on unlevel surfaces. Pt has demonstrated an improvement in [place] Balance assessment from 24/56 to 38/58 and as pt continues to be a fall risk as reviewed and performed floor transfers. Patient to discharge at an ambulatory level Modified Independent.   Patient's care partner is independent to provide the necessary physical assistance at discharge.  Reasons goals not met: N/A all goals met  Recommendation:  Patient will benefit from ongoing skilled PT services in home health setting to continue to advance safe functional mobility, address ongoing impairments in balance, strength, endurance, transfers, safety, and minimize fall risk.  Equipment: No equipment provided  Reasons for discharge: treatment goals met  Patient/family agrees with progress made and goals achieved: Yes  PT Discharge Precautions/Restrictions Precautions Precautions: Fall Precaution Comments: many falls with Sept neck fracture Restrictions Weight Bearing Restrictions: No Vital Signs Oxygen Therapy SpO2: 97 % O2 Device: Room Air O2 Flow Rate (L/min): 0 L/min Pain Pain Assessment Pain Scale: 0-10 Pain Score: 0-No pain Vision/Perception     Cognition Overall Cognitive Status: Within Functional Limits for tasks assessed Arousal/Alertness: Awake/alert Orientation Level: Oriented X4 Sensation Sensation Light Touch: Appears Intact Coordination Gross Motor Movements are Fluid and Coordinated: Yes Fine  Motor Movements are Fluid and Coordinated: Yes Motor  Motor Motor: Hemiplegia;Ataxia Motor - Discharge Observations: generalized weakness with improvement in ataxia and strengthening  Mobility Bed Mobility Bed Mobility: Rolling Right;Rolling Left;Supine to Sit;Sit to Supine Rolling Right: Independent Rolling Left: Independent Supine to Sit: Independent Sit to Supine: Independent Transfers Transfers: Stand to [employer] Pivot Transfers Sit to Stand: Independent with assistive device Stand to Sit: Independent with assistive device Stand Pivot Transfers: Independent with assistive device Transfer (Assistive device): 4-wheeled walker Locomotion  Gait Ambulation: Yes Gait Assistance: Independent with assistive device Gait Distance (Feet): 200 Feet Assistive device: Rollator Gait Gait: Yes Gait Pattern: Impaired Gait Pattern: Narrow base of support Stairs / Additional Locomotion Stairs: Yes Stairs Assistance: Independent with assistive device Stair Management Technique: Two rails Number of Stairs: 12 Height of Stairs: 6 Curb: Independent with assistive device Wheelchair Mobility Wheelchair Mobility: No  Trunk/Postural Assessment  Cervical Assessment Cervical Assessment: Exceptions to [facility] Cervical AROM Overall Cervical AROM Comments: limited AROM due to recent cervical fracture 11/2017 Thoracic Assessment Thoracic Assessment: Exceptions to [facility] Lumbar Assessment Lumbar Assessment: Exceptions to [facility] Postural Control Postural Control: Deficits on evaluation Righting Reactions: delayed Protective Responses: delayed  Balance Balance Balance Assessed: Yes Standardized Balance Assessment Standardized Balance Assessment: Berg Balance Test Berg Balance Test Sit to Stand: Able to stand  independently using hands Standing Unsupported: Able to stand safely 2 minutes Sitting with Back Unsupported but Feet Supported on Floor or Stool: Able to sit safely and securely 2  minutes Stand to Sit: Controls descent by using hands Transfers: Able to transfer safely, definite need of hands Standing Unsupported with Eyes Closed: Able to stand 10 seconds safely Standing Ubsupported with Feet Together: Able to place feet together independently and stand 1 minute safely From Standing, Reach Forward with Outstretched Arm: Can reach forward >12 cm safely (5")  From Standing Position, Pick up Object from Floor: Able to pick up shoe, needs supervision From Standing Position, Turn to Look Behind Over each Shoulder: Turn sideways only but maintains balance Turn 360 Degrees: Able to turn 360 degrees safely but slowly Standing Unsupported, Alternately Place Feet on Step/Stool: Able to complete >2 steps/needs minimal assist Standing Unsupported, One Foot in Front: Able to take small step independently and hold 30 seconds Standing on One Leg: Unable to try or needs assist to prevent fall Total Score: 38 Static Sitting Balance Static Sitting - Level of Assistance: 6: Modified independent (Device/Increase time) Dynamic Sitting Balance Dynamic Sitting - Level of Assistance: 6: Modified independent (Device/Increase time) Static Standing Balance Static Standing - Level of Assistance: 6: Modified independent (Device/Increase time) Dynamic Standing Balance Dynamic Standing - Level of Assistance: 6: Modified independent (Device/Increase time) Extremity Assessment  RUE Assessment RUE Assessment: Within Functional Limits LUE Assessment LUE Assessment: Within Functional Limits RLE Assessment RLE Assessment: Within Functional Limits LLE Assessment LLE Assessment: Exceptions to [facility] Strength Comments: hip flexion 4+/5, all others [facility]    Rosita DeChalus 06/23/2018, 9:56 AM

## 2018-06-23 NOTE — Plan of Care (Signed)
  Problem: Consults Goal: RH GENERAL PATIENT EDUCATION Description See Patient Education module for education specifics. Outcome: Progressing   Problem: RH SKIN INTEGRITY Goal: RH STG SKIN FREE OF INFECTION/BREAKDOWN Description No new breakdown with min assist   Outcome: Progressing Goal: RH STG ABLE TO PERFORM INCISION/WOUND CARE W/ASSISTANCE Description STG Able To Perform Incision/Wound Care With [employer].  Outcome: Progressing   Problem: RH SAFETY Goal: RH STG ADHERE TO SAFETY PRECAUTIONS W/ASSISTANCE/DEVICE Description STG Adhere to Safety Precautions With Min Assistance/Device.  Outcome: Progressing   Problem: RH PAIN MANAGEMENT Goal: RH STG PAIN MANAGED AT OR BELOW PT'S PAIN GOAL Description <3 out of 10.   Outcome: Progressing

## 2018-06-24 ENCOUNTER — Encounter: Payer: Self-pay | Admitting: Cardiology

## 2018-06-24 LAB — GLUCOSE, CAPILLARY: Glucose-Capillary: 93 mg/dL (ref 70–99)

## 2018-06-24 MED ORDER — LEVOTHYROXINE SODIUM 75 MCG PO TABS: 75.0000 ug | ORAL_TABLET | Freq: Every day | ORAL | 1 refills | Status: AC

## 2018-06-24 MED ORDER — DOCUSATE SODIUM 100 MG PO CAPS: 100.0000 mg | ORAL_CAPSULE | Freq: Every day | ORAL | 0 refills | Status: AC

## 2018-06-24 MED ORDER — PANTOPRAZOLE SODIUM 40 MG PO TBEC: 40.0000 mg | DELAYED_RELEASE_TABLET | Freq: Every day | ORAL | 1 refills | Status: AC

## 2018-06-24 MED ORDER — SERTRALINE HCL 50 MG PO TABS: 50.0000 mg | ORAL_TABLET | Freq: Every day | ORAL | 1 refills | Status: DC

## 2018-06-24 NOTE — Progress Notes (Signed)
Remote pacemaker transmission.   

## 2018-06-24 NOTE — Discharge Summary (Signed)
Physician Discharge Summary  Patient ID: Meredith Gonzalez MRN: [id number] DOB/AGE: 1935-07-09 83 y.o.  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 EXAM: CERVICAL SPINE - COMPLETE 4+ VIEW COMPARISON:  CT scan of May 30, 2018. FINDINGS: Findings consistent with old odontoid fracture as seen on prior CT scan. Minimal grade 1 anterolisthesis of C3-4 and C4-5 is noted secondary to posterior facet joint hypertrophy. Fusion of C5-6 and C6-7 is noted which most likely is degenerative in etiology. Degenerative changes are seen involving posterior facet joints bilaterally. No acute fracture is noted. No definite neural foraminal stenosis is noted. No prevertebral soft tissue swelling is noted. IMPRESSION: Findings consistent with old odontoid fracture as seen on prior CT scan. Degenerative changes are noted throughout the cervical spine as described above. No definite acute abnormality is noted. Electronically Signed   By: Marijo Conception, M.D.   On: 06/17/2018 09:58   Dg Lumbar Spine Complete  Result Date: 06/27/2018 CLINICAL DATA:  Fall yesterday with low back pain. EXAM: LUMBAR SPINE - COMPLETE 4+ VIEW COMPARISON:  08/04/2017 FINDINGS: Vertebral body alignment is within normal. There is mild to moderate spondylosis of the lumbar spine to include facet arthropathy over the lower lumbar spine. Moderate compression fracture of T12 unchanged. Mild compression fractures of L3 and L4 unchanged. Slight interval worsening depression of the superior endplate of L2 with cortical step-off involving the anterior L2 vertebral body cortex likely acute injury new mild compression  deformity of L1 likely acute. Moderate disc space narrowing at the L5-S1 level. Remainder of the exam is unchanged. IMPRESSION: Mild compression fracture of L1 as well as depression of the superior endplate of L2 with cortical step-off along the anterior cortex of the L2 vertebral body as these findings likely represent acute fractures. Moderate spondylosis of the lumbar spine with disc disease at the L5-S1 level. Stable chronic compression fractures of T12, L3 and L4. Electronically Signed   By: Marin Olp M.D.   On: 06/27/2018 17:10   Ct Head Wo Contrast  Result Date: 06/27/2018 CLINICAL DATA:  Status post fall.  No head trauma.  No headache. EXAM: CT HEAD WITHOUT CONTRAST CT CERVICAL SPINE WITHOUT CONTRAST TECHNIQUE: Multidetector CT imaging of the head and cervical spine was performed following the standard protocol without intravenous contrast. Multiplanar CT image reconstructions of the cervical spine were also generated. COMPARISON:  12/28/2017 FINDINGS: CT HEAD FINDINGS Brain: No evidence of acute infarction, hemorrhage, extra-axial collection, ventriculomegaly, or mass effect. Generalized cerebral atrophy. Periventricular white matter low attenuation likely secondary to microangiopathy. Vascular: Cerebrovascular atherosclerotic calcifications are noted. Skull: Negative for fracture or focal lesion. Sinuses/Orbits: Visualized portions of the orbits are unremarkable. Visualized portions of the paranasal sinuses and mastoid air cells are unremarkable. Other: None. CT CERVICAL SPINE FINDINGS Alignment: 1-2 mm anterolisthesis of C3 on C4. Skull base and vertebrae: No acute fracture. Ununited type 2 dens fracture. Ununited anterior and posterior arch fracture of C1. Soft tissues and spinal canal: No prevertebral fluid or swelling. No visible canal hematoma. Disc levels: Anterior cervical fusion at C5-6 and C6-7. Mild disc height loss at C7-T1. Bilateral uncovertebral degenerative changes and facet  arthropathy at C3-4 with bilateral  foraminal stenosis. Moderate left facet arthropathy at C4-5. Moderate bilateral facet arthropathy at C7-T1. Upper chest: Lung apices are clear. Other: No fluid collection or hematoma. IMPRESSION: 1. No acute intracranial pathology. 2. Stable ununited type 2 dens fracture. Stable anterior and posterior C1 arch fractures. 3. No new cervical injury. Electronically Signed   By: Kathreen Devoid   On: 06/27/2018 16:41   Ct Head Wo Contrast  Result Date: 06/17/2018 CLINICAL DATA:  Bilateral occipital headaches radiating to frontal regions EXAM: CT HEAD WITHOUT CONTRAST TECHNIQUE: Contiguous axial images were obtained from the base of the skull through the vertex without intravenous contrast. COMPARISON:  May 31, 2018 FINDINGS: Brain: Mild diffuse atrophy is stable. There is no intracranial mass, hemorrhage, extra-axial fluid collection, or midline shift. There is small vessel disease in the centra semiovale bilaterally, stable. Small vessel disease is also noted in each lateral thalamus and in the anterior limbs of the internal and external capsules bilaterally, stable. No acute appearing infarct is evident on this study. Vascular: There is no appreciable hyperdense vessel. There is calcification in each carotid siphon region. Skull: The bony calvarium appears intact. Sinuses/Orbits: There is mucosal thickening in several ethmoid air cells. Other visualized paranasal sinuses are clear. Visualized orbits appear symmetric bilaterally. Other: Mastoid air cells are clear. IMPRESSION: Stable atrophy with supratentorial small vessel disease. No acute infarct evident. No mass or hemorrhage. There are foci of arterial vascular calcification. There is mucosal thickening in several ethmoid air cells. Electronically Signed   By: Lowella Grip III M.D.   On: 06/17/2018 07:15   Ct Cervical Spine Wo Contrast  Result Date: 06/27/2018 CLINICAL DATA:  Status post fall.  No head trauma.  No  headache. EXAM: CT HEAD WITHOUT CONTRAST CT CERVICAL SPINE WITHOUT CONTRAST TECHNIQUE: Multidetector CT imaging of the head and cervical spine was performed following the standard protocol without intravenous contrast. Multiplanar CT image reconstructions of the cervical spine were also generated. COMPARISON:  12/28/2017 FINDINGS: CT HEAD FINDINGS Brain: No evidence of acute infarction, hemorrhage, extra-axial collection, ventriculomegaly, or mass effect. Generalized cerebral atrophy. Periventricular white matter low attenuation likely secondary to microangiopathy. Vascular: Cerebrovascular atherosclerotic calcifications are noted. Skull: Negative for fracture or focal lesion. Sinuses/Orbits: Visualized portions of the orbits are unremarkable. Visualized portions of the paranasal sinuses and mastoid air cells are unremarkable. Other: None. CT CERVICAL SPINE FINDINGS Alignment: 1-2 mm anterolisthesis of C3 on C4. Skull base and vertebrae: No acute fracture. Ununited type 2 dens fracture. Ununited anterior and posterior arch fracture of C1. Soft tissues and spinal canal: No prevertebral fluid or swelling. No visible canal hematoma. Disc levels: Anterior cervical fusion at C5-6 and C6-7. Mild disc height loss at C7-T1. Bilateral uncovertebral degenerative changes and facet arthropathy at C3-4 with bilateral foraminal stenosis. Moderate left facet arthropathy at C4-5. Moderate bilateral facet arthropathy at C7-T1. Upper chest: Lung apices are clear. Other: No fluid collection or hematoma. IMPRESSION: 1. No acute intracranial pathology. 2. Stable ununited type 2 dens fracture. Stable anterior and posterior C1 arch fractures. 3. No new cervical injury. Electronically Signed   By: Kathreen Devoid   On: 06/27/2018 16:41     Labs:  Basic Metabolic Panel: BMP Latest Ref Rng & Units 06/22/2018 06/19/2018  Glucose 70 - 99 mg/dL 89 138(H)  BUN 8 - 23 mg/dL 18 19  Creatinine 0.44 - 1.00 mg/dL 0.74 0.83  BUN/Creat Ratio 12 -  28 - -  Sodium 135 - 145 mmol/L 139 139  Potassium 3.5 - 5.1  mmol/L 4.2 4.6  Chloride 98 - 111 mmol/L 110 108  CO2 22 - 32 mmol/L 23 23  Calcium 8.9 - 10.3 mg/dL 8.4(L) 8.8(L)    CBC: CBC Latest Ref Rng & Units 06/27/2018 06/23/2018 06/20/2018  WBC 4.0 - 10.5 K/uL 6.5 5.1 4.8  Hemoglobin 12.0 - 15.0 g/dL 11.0(L) 9.7(L) 9.1(L)  Hematocrit 36.0 - 46.0 % 36.8 31.8(L) 31.5(L)  Platelets 150 - 400 K/uL 160 194 208    CBG: Recent Labs  Lab 06/24/18 0619  GLUCAP 93    Brief HPI:   Meredith Gonzalez is an 83 year old female with history of CHF, HTN, cervical stenosis with chronic pain, T2DM, A. fib s/p PPM, CVA related to severe right-ICA stenosis who was readmitted on 0 05/31/18 with fall backwards day prior to admission followed by dizziness, left-sided weakness and speech difficulty.  CTA head/neck was negative for emergent findings.  Carotid Dopplers revealed right-40 to 59% ICA stenosis and Dr. Rigoberto Noel felt the stroke was embolic likely due to A. fib as patient with recurrent similar symptoms.  Eliquis was increased to 5 mg twice daily.  VVS recommended CEA due to moderate to severe extremely irregular right bifurcation plaque.  She underwent R-CEA by Dr. Donnetta Hutching on   06/03/18.  Postop she has had some issues with dysphagia, dizziness with ataxia, balance deficits and left-sided weakness.  CIR was recommended due to functional decline   Hospital Course: Nohemy Koop was admitted to rehab 06/05/2018 for inpatient therapies to consist of PT, ST and OT at least three hours five days a week. Past admission physiatrist, therapy team and rehab RN have worked together to provide customized collaborative inpatient rehab. Right neck incision is C/D/I and is healing well without signs of infection. Lasix was held due to evidence of AKI and she was encouraged to increase fluid intake. Blood pressures and heart rate were monitored on bid and have been controlled. Blood sugars have been controlled with CM diet.   She  did report severe occipital pain on 3/25 felt to be due to facet arthropathy due to degenerative changes that were noted on follow up films. This has been treated with local measures as well as robaxin on prn basis as son did not want anything stronger used due to concerns of SE.  Throat discomfort has resolved with improvement in po intake and follow up labs showed that AKI has resolved. Follow up CBC showed that ABLA is resolving. Ego support has been provided by team and she has made steady progress towards modified independent level. She will continue to resume follow up HHPT and [place] [facility] after discharge.    Rehab course: During patient's stay in rehab weekly team conferences were held to monitor patient's progress, set goals and discuss barriers to discharge. At admission, patient required min assist for mobility and for basic self care tasks.  She  has had improvement in activity tolerance, balance, postural control as well as ability to compensate for deficits. She has had improvement in functional use LUE  and LLE as well as improvement in awareness.  She was able to complete ADL tasks as modified independent level. She was modified independent for transfers and able to ambulate 200' with rollater. She was able to climb 12 stairs with 2 rails independently.     Disposition: Home  Diet: Heart Healthy  Special Instructions: 1. No driving. No strenuous activity.    Discharge Instructions    Ambulatory referral to Physical Medicine Rehab  Complete by:  As directed    1-2 weeks follow up appointment     Allergies as of 06/24/2018      Reactions   Valium [diazepam] Anxiety   Makes patient hyper   Tramadol    sedation   Codeine Hives, Itching   Can take with Benadryl   Darifenacin Itching   Can take with Benadryl   Darvon [propoxyphene Hcl] Itching   Can take with Benadryl   Daypro [oxaprozin] Itching   Can take with Benadryl   Enablex [darifenacin Hydrobromide Er]  Itching   Can take with Benadryl   Oxycodone Itching   Can take with Benadryl   Propoxyphene Itching   Can take with Benadryl   Risperdal [risperidone] Itching   Can take with Benadryl   Talwin [pentazocine] Itching   Can take with Benadryl   Vicodin [hydrocodone-acetaminophen] Itching   Can take with Benadryl      Medication List    STOP taking these medications   Calcium Citrate-Vitamin D3 315-250 MG-UNIT Tabs   fluticasone 50 MCG/ACT nasal spray Commonly known as:  FLONASE   furosemide 20 MG tablet Commonly known as:  LASIX   methocarbamol 500 MG tablet Commonly known as:  ROBAXIN   VITAMIN D3 PO     TAKE these medications   acetaminophen 500 MG tablet Commonly known as:  TYLENOL Take 500 mg by mouth every 6 (six) hours as needed for moderate pain.   albuterol 108 (90 Base) MCG/ACT inhaler Commonly known as:  PROVENTIL HFA;VENTOLIN HFA Inhale 2 puffs into the lungs every 6 (six) hours as needed for wheezing or shortness of breath. Notes to patient:  Is rescue inhaler   amiodarone 200 MG tablet Commonly known as:  PACERONE Take 1 tablet (200 mg total) by mouth every morning. What changed:  when to take this   apixaban 5 MG Tabs tablet Commonly known as:  ELIQUIS Take 1 tablet (5 mg total) by mouth 2 (two) times daily.   atorvastatin 20 MG tablet Commonly known as:  LIPITOR Take 1 tablet (20 mg total) by mouth daily for 30 days. Notes to patient:  Take with supper daily--start tomorrow   budesonide-formoterol 160-4.5 MCG/ACT inhaler Commonly known as:  Symbicort Inhale 2 puffs into the lungs 2 (two) times daily. Notes to patient:  [place] FROM THE HOSPITAL-USE THIS UP THEN CAN GO BACK TO USING SYMBICORT.    docusate sodium 100 MG capsule Commonly known as:  COLACE Take 1 capsule (100 mg total) by mouth daily.   ferrous sulfate 325 (65 FE) MG tablet Take 1 tablet (325 mg total) by mouth 2 (two) times daily with a meal.   flintstones complete  60 MG chewable tablet Chew 1 tablet by mouth daily.   gabapentin 300 MG capsule Commonly known as:  NEURONTIN Take 1-2 capsules (300-600 mg total) by mouth See admin instructions. Take 1 capsule (300 mg) by mouth in the mornning & take 2 capsules (600 mg) by mouth at night What changed:  additional instructions   levothyroxine 75 MCG tablet Commonly known as:  SYNTHROID, LEVOTHROID Take 1 tablet (75 mcg total) by mouth daily at 6 (six) AM. What changed:    medication strength  how much to take   nitroGLYCERIN 0.4 MG SL tablet Commonly known as:  NITROSTAT Place 1 tablet (0.4 mg total) under the tongue every 5 (five) minutes as needed for chest pain.   pantoprazole 40 MG tablet Commonly known as:  PROTONIX Take 1  tablet (40 mg total) by mouth daily.   sertraline 50 MG tablet Commonly known as:  ZOLOFT Take 1 tablet (50 mg total) by mouth daily.   vitamin B-12 1000 MCG tablet Commonly known as:  CYANOCOBALAMIN Take 1 tablet (1,000 mcg total) by mouth daily.      Follow-up Information    Kirsteins, Luanna Salk, MD Follow up.   Specialty:  Physical Medicine and Rehabilitation Why:  office will call for follow up appointment Contact information: [place] [address] [phone]        [facility] Follow up.   Contact information: [address]     [address] [place] [address] [phone]       Fay Records, MD. Call.   Specialty:  Cardiology Why:  for routine follow up Contact information: [address] [place] [address] [phone]        Marty Heck, MD Follow up.   Specialty:  Vascular Surgery Why:  office will call you for follow up appointment Contact information: [place] [address] [phone]        Marshell Garfinkel, MD. Call.   Specialty:  Pulmonary Disease Why:  for follow up Contact information: [address]  [place] [address] [phone]        Sinclair Ship, MD. Call.   Specialty:  Internal Medicine Why:  for post hospital follow up Contact information: [address] [place] [address] [phone]        Evans Lance, MD .   Specialty:  Cardiology Contact information: [address] [place] [address] [phone]           Signed: Bary Leriche 06/30/2018, 9:45 PM

## 2018-06-24 NOTE — Progress Notes (Signed)
[name]ICINE [facility]ILIT[facility] PROGRESS NOTE   Subjective/Complaints: Had another good day yesterday. Denies any dizziness or focal weakness. Feels ready to go home  ROS: Patient denies fever, rash, sore throat, blurred vision, nausea, vomiting, diarrhea, cough, shortness of breath or chest pain, joint or back pain, headache, or mood change.    Objective:   No results found. Recent Labs    06/23/18 0723  WBC 5.1  HGB 9.7*  HCT 31.8*  PLT 194   Recent Labs    06/22/18 0610  NA 139  K 4.2  CL 110  CO2 23  GLUCOSE 89  BUN 18  CREATININE 0.74  CALCIUM 8.4*    Intake/Output Summary (Last 24 hours) at 06/24/2018 0915 Last data filed at 06/23/2018 2100 Gross per 24 hour  Intake 720 ml  Output -  Net 720 ml     Physical Exam: Vital Signs Blood pressure 131/70, pulse 79, temperature 98.4 F (36.9 C), temperature source Oral, resp. rate 16, height '5\' 5"'  (1.651 m), weight 64.2 kg, SpO2 97 %. Constitutional: No distress . Vital signs reviewed. HEENT: EOMI, oral membranes moist Neck: supple Cardiovascular: RRR without murmur. No JVD    Respiratory: CTA Bilaterally without wheezes or rales. Normal effort    GI: BS +, non-tender, non-distended  Skin: dry, intact Extremities: no edema wheezes or rales. Normal effort    GI: BS +, non-tender, non-distended  Musculoskeletal:Normal range of motion.  Neurological: She isalertand oriented to person, place, and time. Sl ataxic speech.   LUE and LLE 4/5.  RUE and RLE 5/5. -     Mild dysmetria LUE persists  Skin: Skin iswarmand dry. She isnot diaphoretic.  Psychiatric: pleasant     Assessment/Plan: 1. Functional deficits secondary to embolic right cortical infarct which require 3+ hours per day of interdisciplinary therapy in a comprehensive inpatient rehab setting.  Physiatrist is providing close team supervision and 24 hour management of active medical problems listed below.  Physiatrist and rehab  team continue to assess barriers to discharge/monitor patient progress toward functional and medical goals  Care Tool:  Bathing  Bathing activity did not occur: Refused Body parts bathed by patient: Right arm, Left arm, Chest, Abdomen, Right upper leg, Left upper leg, Front perineal area, Buttocks, Face, Left lower leg, Right lower leg   Body parts bathed by helper: Left lower leg, Right lower leg     Bathing assist Assist Level: Independent with assistive device     Upper Body Dressing/Undressing Upper body dressing Upper body dressing/undressing activity did not occur (including orthotics): Refused(on toilet for majority of eval) What is the patient wearing?: Pull over shirt    Upper body assist Assist Level: Independent    Lower Body Dressing/Undressing Lower body dressing    Lower body dressing activity did not occur: Refused(on toilet for majority of eval) What is the patient wearing?: Underwear/pull up, Pants     Lower body assist Assist for lower body dressing: Independent with assitive device Assistive Device Comment: reacher   Toileting Toileting Toileting Activity did not occur (Clothing management and hygiene only): N/A (no void or bm)  Toileting assist Assist for toileting: Independent with assistive device     Transfers Chair/bed transfer  Transfers assist     Chair/bed transfer assist level: Independent with assistive device     Locomotion Ambulation   Ambulation assist      Assist level: Independent with assistive device Assistive device: Rollator Max distance: 260f   Walk 10 feet  activity   Assist     Assist level: Independent with assistive device Assistive device: Rollator   Walk 50 feet activity   Assist    Assist level: Independent with assistive device Assistive device: Rollator    Walk 150 feet activity   Assist Walk 150 feet activity did not occur: Safety/medical concerns  Assist level: Independent with assistive  device Assistive device: Rollator    Walk 10 feet on uneven surface  activity   Assist     Assist level: Contact Guard/Touching assist Assistive device: Rollator   Wheelchair     Assist Will patient use wheelchair at discharge?: No Type of Wheelchair: Manual    Wheelchair assist level: Supervision/Verbal cueing Max wheelchair distance: 173f    Wheelchair 50 feet with 2 turns activity    Assist        Assist Level: Supervision/Verbal cueing    Wheelchair 150 feet activity     Assist     Assist Level: Supervision/Verbal cueing    Medical Problem List and Plan: 1.Functional and mobility deficitssecondary to embolic right cortical infarct -goals met  -dc home today  -Patient to see Rehab MD/provider in the office for transitional care encounter in 2-4 weeks.   -focal dropouts/weakness likely related to robaxin in setting of her post-stroke baseline weakness  -BMET essentially normal 3/30  -TSH still high, Free T4 low nl, increased to 764m tablet  -continue TEDS, push fluids 2. Antithrombotics: -DVT/anticoagulation:Pharmaceutical:Other (comment)--Eliquis -antiplatelet therapy: N/A 3.Chronic neck pain/Pain Management:On gabapentin tid   -off robaxin as above. Advised use of her kpad for spasms -no tramadol d/t intolerance   -would avoid  muscle relaxants in the foreseeable future 4. Mood:LCSW to follow for evaluation and support. -antipsychotic agents: N/A 5. Neuropsych: This patientiscapable of making decisions on herown behalf. 6. Skin/Wound Care:Routine pressure relief measures. 7. Fluids/Electrolytes/Nutrition:encourage PO fluids    .    -BUN improved    8. HTN: Monitor BP bid  -had been on low dose lasix (held see #7). Vitals:   06/23/18 2051 06/24/18 0533  BP:  131/70  Pulse:  79  Resp:  16  Temp:  98.4 F (36.9 C)  SpO2: 95% 97%  controlled 4/1. No orthostasis  reported 9. T2DMwith peripheral neuropathy:Hgb A1c- 5.7. Diet controlled.  -controlled 3/30 10 A fib s/p PPWNU:UVOZDGUR bid--continue amiodarone and Eliquis   11. DOE/Likely COPD: Recently started on Dulera.Resumed albuterol prn 12.R-CEA s/p CEA 3/11: wound stable, clean 13. Anemia: stable 3/25 -no gross blood loss  -recheck hgb up to 9.7 3/31 14.  Emotional lability post CVA - trial sertraline 5059m5.  C spine pain upper left facet most likely pain generator as noted on CTA neck ~3wk ago no new falls or trauma, no fever no WBC elevation. No radic or myelopathic finding on exam---utilize heat, rom/exercises at home.        LOS: 19 days A FACE TO FACE EVALUATION WAS PERFORMED  ZacMeredith Staggers1/2020, 9:15 AM

## 2018-06-24 NOTE — Progress Notes (Signed)
Patient received discharge instructions from Algis Liming, PA-C while patient's son listened via telephone with verbal understanding. Patient discharged to home with patient belongings and instructions.

## 2018-06-25 ENCOUNTER — Inpatient Hospital Stay: Payer: Medicare Other | Admitting: Adult Health

## 2018-06-27 ENCOUNTER — Emergency Department (HOSPITAL_BASED_OUTPATIENT_CLINIC_OR_DEPARTMENT_OTHER): Payer: Medicare Other

## 2018-06-27 ENCOUNTER — Other Ambulatory Visit: Payer: Self-pay

## 2018-06-27 ENCOUNTER — Emergency Department (HOSPITAL_BASED_OUTPATIENT_CLINIC_OR_DEPARTMENT_OTHER)
Admission: EM | Admit: 2018-06-27 | Discharge: 2018-06-27 | Disposition: A | Discharge status: Home / Self Care | Payer: Medicare Other | Attending: Emergency Medicine | Admitting: Emergency Medicine

## 2018-06-27 ENCOUNTER — Encounter (HOSPITAL_BASED_OUTPATIENT_CLINIC_OR_DEPARTMENT_OTHER): Payer: Self-pay | Admitting: *Deleted

## 2018-06-27 DIAGNOSIS — W19XXXA Unspecified fall, initial encounter: Secondary | ICD-10-CM

## 2018-06-27 DIAGNOSIS — R42 Dizziness and giddiness: Secondary | ICD-10-CM | POA: Insufficient documentation

## 2018-06-27 DIAGNOSIS — I951 Orthostatic hypotension: Secondary | ICD-10-CM | POA: Diagnosis not present

## 2018-06-27 DIAGNOSIS — M25552 Pain in left hip: Secondary | ICD-10-CM | POA: Insufficient documentation

## 2018-06-27 DIAGNOSIS — W010XXA Fall on same level from slipping, tripping and stumbling without subsequent striking against object, initial encounter: Secondary | ICD-10-CM | POA: Diagnosis not present

## 2018-06-27 LAB — CBC WITH DIFFERENTIAL/PLATELET
Abs Immature Granulocytes: 0.01 10*3/uL (ref 0.00–0.07)
Basophils Absolute: 0 10*3/uL (ref 0.0–0.1)
Basophils Relative: 0 %
Eosinophils Absolute: 0.1 10*3/uL (ref 0.0–0.5)
Eosinophils Relative: 1 %
HCT: 36.8 % (ref 36.0–46.0)
Hemoglobin: 11 g/dL — ABNORMAL LOW (ref 12.0–15.0)
Immature Granulocytes: 0 %
Lymphocytes Relative: 12 %
Lymphs Abs: 0.8 10*3/uL (ref 0.7–4.0)
MCH: 31.9 pg (ref 26.0–34.0)
MCHC: 29.9 g/dL — ABNORMAL LOW (ref 30.0–36.0)
MCV: 106.7 fL — ABNORMAL HIGH (ref 80.0–100.0)
Monocytes Absolute: 0.7 10*3/uL (ref 0.1–1.0)
Monocytes Relative: 11 %
Neutro Abs: 4.9 10*3/uL (ref 1.7–7.7)
Neutrophils Relative %: 76 %
Platelets: 160 10*3/uL (ref 150–400)
RBC: 3.45 MIL/uL — ABNORMAL LOW (ref 3.87–5.11)
RDW: 18 % — ABNORMAL HIGH (ref 11.5–15.5)
WBC: 6.5 10*3/uL (ref 4.0–10.5)
nRBC: 0 % (ref 0.0–0.2)

## 2018-06-27 LAB — BASIC METABOLIC PANEL
Anion gap: 6 (ref 5–15)
BUN: 20 mg/dL (ref 8–23)
CO2: 23 mmol/L (ref 22–32)
Calcium: 8.2 mg/dL — ABNORMAL LOW (ref 8.9–10.3)
Chloride: 109 mmol/L (ref 98–111)
Creatinine, Ser: 0.74 mg/dL (ref 0.44–1.00)
GFR calc Af Amer: >60 mL/min (ref >60)
GFR calc non Af Amer: >60 mL/min (ref >60)
Glucose, Bld: 107 mg/dL — ABNORMAL HIGH (ref 70–99)
Potassium: 4 mmol/L (ref 3.5–5.1)
Sodium: 138 mmol/L (ref 135–145)

## 2018-06-27 MED ORDER — MORPHINE SULFATE (PF) 4 MG/ML IV SOLN
4.0000 mg | Freq: Once | INTRAVENOUS | Status: AC
  Administered 2018-06-27: 4 mg via INTRAVENOUS
  Filled 2018-06-27: qty 1

## 2018-06-27 MED ORDER — SODIUM CHLORIDE 0.9 % IV BOLUS (SEPSIS)
500.0000 mL | Freq: Once | INTRAVENOUS | Status: AC
  Administered 2018-06-27: 18:00:00 500 mL via INTRAVENOUS

## 2018-06-27 MED ORDER — MORPHINE SULFATE (PF) 2 MG/ML IV SOLN
2.0000 mg | Freq: Once | INTRAVENOUS | Status: AC
  Administered 2018-06-27: 16:00:00 2 mg via INTRAVENOUS
  Filled 2018-06-27: qty 1

## 2018-06-27 NOTE — ED Notes (Signed)
ED Provider at bedside. 

## 2018-06-27 NOTE — ED Triage Notes (Signed)
Pts son: [phone]

## 2018-06-27 NOTE — ED Provider Notes (Signed)
Care assumed from  [facility], PA-C at shift change with re-evaluation pending.   In brief, this patient is a 83 y.o. F past medical history of A. fib, CVA.  Currently on Eliquis for treatment of A. Fib.,  Who presents today for evaluation after fall.  Patient reports yesterday she was standing up from the kitchen table and went to go grab her walker but then fell backwards landed on her hip.  See note from previous provider for full history/physical exam.  PLAN: Patient here with blood work and imaging here in the ED.  Previous provider went to ambulate patient but patient states she felt slightly dizzy.  She was found to be orthostatic.  Patient given a bolus of fluids.  Plan to reevaluate patient.  MDM:  Reevaluation after fluids.  Vitals stable.  I personally ambulated patient in the ED.  She was able to get up and walk with me holding onto her.  She normally ambulates with the assistance of a walker.  She denied any dizziness.  I discussed with her son, Rushie Brazel.  Patient recently discharged home from rehab facility where she lives by herself.  She has OT coming out to help her.  Encourage adequate hydration.  Son concerned about patient's narcotic use and wishes that patient not go home with any narcotics.  Discussed plan with patient.  Patient states she feels comfortable going home. At this time, patient exhibits no emergent life-threatening condition that require further evaluation in ED or admission. Patient had ample opportunity for questions and discussion. All patient's questions were answered with full understanding. Strict return precautions discussed. Patient expresses understanding and agreement to plan.   Portions of this note were generated with Lobbyist. Dictation errors may occur despite best attempts at proofreading.     1. Fall, initial encounter   2. Fall   3. Pain of left hip joint   4. Orthostatic hypotension       Desma Mcgregor 06/27/18 2233    Malvin Johns, MD 06/27/18 2237

## 2018-06-27 NOTE — Discharge Instructions (Addendum)
There were no new findings on your head Ct and the Ct of your neck. There was no obvious fracture on your hip xray and your hardware from your hip surgeries is in place where it should be. Your xray of you back showed possible compression fracture in the L1 and L2 vertebrae. It is hard to tell if they are new or old fractures from the past. Compression fractures are treated with pain controll and physical therapy. Your blood pressure dropped when you went from laying to standing. This is probably the cause of your dizziness. We have given you some IV fluids to hopefully help with this. Be sure to drink plenty of water every day. Always stand up slowly and stand for at least 30 seconds prior to beginning to walk to ensure that you do not fall. Narcotic pain medication is not safe to prescribe for you at this time given the other medications that you are taking and your previous stroke. Please follow up with your primary doctor or orthopedics. Orthopedics may be able to give you joint injections for pain relief.

## 2018-06-27 NOTE — ED Notes (Signed)
Pt in radiology 

## 2018-06-27 NOTE — ED Notes (Signed)
Pt's son on phone in room, EDPA aware that he is requesting an update

## 2018-06-27 NOTE — ED Triage Notes (Signed)
Pt reports fall yesterday. Recent falls-d/c from [facility] this week after treatment from stroke rehab. Reports left hip and back pain. No obvious deformity, shortening or rotation.

## 2018-06-27 NOTE — ED Provider Notes (Signed)
[facility] EMERGENCY DEPARTMENT Provider Note   CSN: [id number] Arrival date & time: 06/27/18  1509    History   Chief Complaint Chief Complaint  Patient presents with  . Fall    HPI Meredith Gonzalez is a 83 y.o. female.     Patient is an 83 year old female who was recently sent home from rehab center after suffering a right-sided stroke who presents the emergency department for a fall.  Patient also has a history of A. fib, on Eliquis, hypertension, MI, hypothyroidism.  Patient reports that yesterday she went to stand up from the kitchen table.  Reports that she grabbed her walker but then fell backwards.  Reports that she landed on her left hip.  Reports that her head did hit the carpeted floor.  Denies syncope.  She reports initially she was up and walking and did not feel so bad.  Starting today she reports having a lot of pain in her left groin.  Reports initially yesterday she was able to be up and walk on it.  Reports that today she is unable to stand up without feeling off balance and significant pain in her left hip.  She lives alone.     Past Medical History:  Diagnosis Date  . Anemia   . Anxiety   . Arthritis    "all over" (02/11/2018)  . Basal cell carcinoma    "left leg" (02/11/2018)  . Cervical spine fracture ([place]) 12/2017   "C1-2"  . CHF (congestive heart failure) ([place])   . Chronic bronchitis ([place])   . Chronic neck pain    "since I broke my neck 6-8 wk ago" (02/11/2018)  . Diabetic peripheral neuropathy ([place])   . Fibromyalgia   . Fracture of right humerus   . Generalized weakness   . Headache    "weekly" (02/11/2018)  . History of blood transfusion    "related to one of my femur surgeries" (02/11/2018)  . History of echocardiogram    Echo 8/18: EF 50-55, no RWMA, Gr 1 DD, calcified AV leaflets, MAC, trivial MR, mod LAE, PASP 37  . Hypertension   . Hypothyroidism   . Incontinence of urine   . Ischemic stroke ([place]) 2000   "lost part of the  vision in my right eye" (02/11/2018)  . Major depression, chronic   . Myocardial infarction ([place]) 06/2016  . Pacemaker   . Recurrent falls   . Syncope and collapse   . Type 2 diabetes, diet controlled [facility])     Patient Active Problem List   Diagnosis Date Noted  . Stroke due to embolism of carotid artery ([place]) 06/05/2018  . Stroke-like symptom   . Dyspnea   . Acute blood loss anemia   . Acute ischemic stroke ([place]) 05/31/2018  . Stroke ([place]) 05/30/2018  . Chronic diastolic congestive heart failure ([place])   . Anemia of chronic disease   . Hypoalbuminemia due to protein-calorie malnutrition ([place])   . Labile blood pressure   . Atrial fibrillation ([place])   . Chronic neck pain   . Subcortical infarction ([place]) 04/27/2018  . CVA (cerebral vascular accident) ([place]) 04/26/2018  . TIA (transient ischemic attack) 04/24/2018  . CAD (coronary artery disease) 08/07/2016  . (HFpEF) heart failure with preserved ejection fraction ([place]) 08/07/2016  . Ischemic cardiomyopathy 08/07/2016  . Chest pain   . NSTEMI (non-ST elevated myocardial infarction) ([place])   . Status post coronary artery stent placement   . Abnormal stress test   . SOB (shortness of  breath)   . Multifocal pneumonia 07/13/2016  . Elevated troponin 07/13/2016  . Leukocytosis   . Pacemaker 05/17/2014  . Complications, pacemaker cardiac, mechanical 12/31/2012  . Edema extremities 10/08/2012  . Persistent atrial fibrillation 10/08/2012  . Hypertension 10/08/2012  . Hypothyroidism 10/08/2012  . Neuropathy 10/08/2012  . Diabetes ([place]) 10/08/2012  . Depression 09/08/2012  . Anemia 08/08/2012  . Knee fracture, left 08/07/2012  . Recurrent falls 08/07/2012  . Syncope 08/07/2012  . SDH (subdural hematoma) ([place]) 08/07/2012    Past Surgical History:  Procedure Laterality Date  . ABDOMINAL HYSTERECTOMY    . ANKLE FRACTURE SURGERY Right   . BASAL CELL CARCINOMA EXCISION Left    "leg"  . CARDIAC CATHETERIZATION  ~ 2014  .  CARDIOVERSION N/A 02/13/2018   Procedure: CARDIOVERSION;  Surgeon: Sueanne Margarita, MD;  Location: [facility] ENDOSCOPY;  Service: Cardiovascular;  Laterality: N/A;  . CARDIOVERSION N/A 03/27/2018   Procedure: CARDIOVERSION;  Surgeon: Lelon Perla, MD;  Location: [facility] ENDOSCOPY;  Service: Cardiovascular;  Laterality: N/A;  . CARPAL TUNNEL RELEASE     bilateral  . CATARACT EXTRACTION W/ INTRAOCULAR LENS  IMPLANT, BILATERAL Bilateral   . CHOLECYSTECTOMY OPEN    . COLONOSCOPY    . CORONARY ANGIOPLASTY WITH STENT PLACEMENT  06/2016  . CORONARY STENT INTERVENTION N/A 07/19/2016   Procedure: Coronary Stent Intervention;  Surgeon: Peter M Martinique, MD;  Location: [facility] CV LAB;  Service: Cardiovascular;  Laterality: N/A;  . ENDARTERECTOMY Right 06/03/2018   Procedure: ENDARTERECTOMY CAROTID;  Surgeon: Rosetta Posner, MD;  Location: [facility];  Service: Vascular;  Laterality: Right;  . EYE SURGERY     right eye catarace/lens implant  . FEMUR FRACTURE SURGERY Bilateral   . FRACTURE SURGERY    . GASTRIC BYPASS    . INSERT / REPLACE / REMOVE PACEMAKER  ~ 2012  . LEAD REVISION N/A 01/01/2013   Procedure: LEAD REVISION;  Surgeon: Evans Lance, MD;  Location: [facility] CATH LAB;  Service: Cardiovascular;  Laterality: N/A;  . LEFT HEART CATH AND CORONARY ANGIOGRAPHY N/A 07/19/2016   Procedure: Left Heart Cath and Coronary Angiography;  Surgeon: Peter M Martinique, MD;  Location: [place] CV LAB;  Service: Cardiovascular;  Laterality: N/A;  . OVARIAN CYST SURGERY     "one side only"  . PATCH ANGIOPLASTY Right 06/03/2018   Procedure: PATCH ANGIOPLASTY USING HEMASHIELD PLATINUM FINESSE;  Surgeon: Rosetta Posner, MD;  Location: [facility];  Service: Vascular;  Laterality: Right;  . TEE WITHOUT CARDIOVERSION N/A 02/13/2018   Procedure: TRANSESOPHAGEAL ECHOCARDIOGRAM (TEE);  Surgeon: Sueanne Margarita, MD;  Location: [facility] ENDOSCOPY;  Service: Cardiovascular;  Laterality: N/A;  . TONSILLECTOMY       OB History   No obstetric history  on file.      Home Medications    Prior to Admission medications   Medication Sig Start Date End Date Taking? Authorizing Provider  acetaminophen (TYLENOL) 500 MG tablet Take 500 mg by mouth every 6 (six) hours as needed for moderate pain.     [provider]  albuterol (PROVENTIL HFA;VENTOLIN HFA) 108 (90 Base) MCG/ACT inhaler Inhale 2 puffs into the lungs every 6 (six) hours as needed for wheezing or shortness of breath. 05/07/18   Angiulli, Lavon Paganini, PA-C  amiodarone (PACERONE) 200 MG tablet Take 1 tablet (200 mg total) by mouth every morning. Patient taking differently: Take 200 mg by mouth daily.  05/07/18   Angiulli, Lavon Paganini, PA-C  apixaban (ELIQUIS) 5 MG TABS  tablet Take 1 tablet (5 mg total) by mouth 2 (two) times daily. 06/05/18   Dessa Phi, DO  atorvastatin (LIPITOR) 20 MG tablet Take 1 tablet (20 mg total) by mouth daily for 30 days. 06/05/18 07/05/18  Dessa Phi, DO  budesonide-formoterol (SYMBICORT) 160-4.5 MCG/ACT inhaler Inhale 2 puffs into the lungs 2 (two) times daily. 05/20/18   Mannam, Hart Robinsons, MD  docusate sodium (COLACE) 100 MG capsule Take 1 capsule (100 mg total) by mouth daily. 06/24/18   Love, Ivan Anchors, PA-C  ferrous sulfate 325 (65 FE) MG tablet Take 1 tablet (325 mg total) by mouth 2 (two) times daily with a meal. 05/07/18   Angiulli, Lavon Paganini, PA-C  flintstones complete (FLINTSTONES) 60 MG chewable tablet Chew 1 tablet by mouth daily.    [provider]  gabapentin (NEURONTIN) 300 MG capsule Take 1-2 capsules (300-600 mg total) by mouth See admin instructions. Take 1 capsule (300 mg) by mouth in the mornning & take 2 capsules (600 mg) by mouth at night Patient taking differently: Take 300-600 mg by mouth See admin instructions. Take 1 capsule (300 mg) by mouth in the morning & take 2 capsules (600 mg) by mouth at night 05/07/18   Angiulli, Lavon Paganini, PA-C  levothyroxine (SYNTHROID, LEVOTHROID) 75 MCG tablet Take 1 tablet (75 mcg total) by mouth daily  at 6 (six) AM. 06/24/18   Love, Ivan Anchors, PA-C  nitroGLYCERIN (NITROSTAT) 0.4 MG SL tablet Place 1 tablet (0.4 mg total) under the tongue every 5 (five) minutes as needed for chest pain. Patient not taking: Reported on 05/30/2018 10/11/16 03/17/18  Fay Records, MD  pantoprazole (PROTONIX) 40 MG tablet Take 1 tablet (40 mg total) by mouth daily. 06/24/18   Love, Ivan Anchors, PA-C  sertraline (ZOLOFT) 50 MG tablet Take 1 tablet (50 mg total) by mouth daily. 06/24/18   Love, Ivan Anchors, PA-C  vitamin B-12 (CYANOCOBALAMIN) 1000 MCG tablet Take 1 tablet (1,000 mcg total) by mouth daily. 07/20/16   Thurnell Lose, MD    Family History Family History  Problem Relation Age of Onset  . Congestive Heart Failure Mother   . Heart attack Father   . Alcoholism Father   . Alcoholism Sister   . Alcoholism Brother   . Alcoholism Brother   . Throat cancer Brother     Social History Social History   Tobacco Use  . Smoking status: Former Smoker    Packs/day: 0.50    Years: 4.00    Pack years: 2.00    Types: Cigarettes    Last attempt to quit: 08/07/1972    Years since quitting: 45.9  . Smokeless tobacco: Never Used  Substance Use Topics  . Alcohol use: Yes    Frequency: Never    Comment: once or twice a year  . Drug use: Never     Allergies   Valium [diazepam]; Tramadol; Codeine; Darifenacin; Darvon [propoxyphene hcl]; Daypro [oxaprozin]; Enablex [darifenacin hydrobromide er]; Oxycodone; Propoxyphene; Risperdal [risperidone]; Talwin [pentazocine]; and Vicodin [hydrocodone-acetaminophen]   Review of Systems Review of Systems  Constitutional: Negative for chills and fever.  HENT: Negative for ear pain and sore throat.   Eyes: Negative for pain and visual disturbance.  Respiratory: Negative for cough and shortness of breath.   Cardiovascular: Negative for chest pain and palpitations.  Gastrointestinal: Negative for abdominal pain and vomiting.  Genitourinary: Negative for dysuria and hematuria.   Musculoskeletal: Positive for arthralgias, back pain and gait problem. Negative for joint swelling, myalgias, neck pain and  neck stiffness.  Skin: Negative for color change and rash.  Neurological: Positive for dizziness. Negative for seizures and syncope.  All other systems reviewed and are negative.    Physical Exam Updated Vital Signs BP (!) 167/75 (BP Location: Right Arm)   Pulse 72   Temp 98.3 F (36.8 C) (Oral)   Resp 18   Ht _0  (1.651 m)   Wt 61.2 kg   SpO2 95%   BMI 22.46 kg/m   Physical Exam Vitals signs and nursing note reviewed.  Constitutional:      Appearance: Normal appearance. She is not ill-appearing.  HENT:     Head: Normocephalic.     Mouth/Throat:     Pharynx: Oropharynx is clear.  Eyes:     Conjunctiva/sclera: Conjunctivae normal.  Neck:     Musculoskeletal: Full passive range of motion without pain.  Pulmonary:     Effort: Pulmonary effort is normal.  Musculoskeletal:     Left knee: Normal.     Left ankle: Normal.     Lumbar back: She exhibits tenderness and pain. She exhibits no bony tenderness, no swelling, no edema and no spasm.     Comments: Patient has significant pain into the left groin and left lateral hip.  Patient has normal strength sensation pulses in bilateral lower extremities.  The patient has tenderness to palpation in the lower lumbar spine.  Skin:    General: Skin is dry.     Comments: No obvious signs of injury or trauma.  Neurological:     Mental Status: She is alert.  Psychiatric:        Mood and Affect: Mood normal.      ED Treatments / Results  Labs (all labs ordered are listed, but only abnormal results are displayed) Labs Reviewed  CBC WITH DIFFERENTIAL/PLATELET - Abnormal; Notable for the following components:      Result Value   RBC 3.45 (*)    Hemoglobin 11.0 (*)    MCV 106.7 (*)    MCHC 29.9 (*)    RDW 18.0 (*)    All other components within normal limits  BASIC METABOLIC PANEL - Abnormal; Notable for  the following components:   Glucose, Bld 107 (*)    Calcium 8.2 (*)    All other components within normal limits    EKG None  Radiology No results found.  Procedures Procedures (including critical care time)  Medications Ordered in ED Medications  morphine 2 MG/ML injection 2 mg (2 mg Intravenous Given 06/27/18 1606)  morphine 4 MG/ML injection 4 mg (4 mg Intravenous Given 06/27/18 1715)  sodium chloride 0.9 % bolus 500 mL (0 mLs Intravenous Stopped 06/27/18 1943)     Initial Impression / Assessment and Plan / ED Course  I have reviewed the triage vital signs and the nursing notes.  Pertinent labs & imaging results that were available during my care of the patient were reviewed by me and considered in my medical decision making (see chart for details).  Clinical Course as of Jul 01 723  Sat Jun 27, 2018  1708 I touched base with the patient's son.  Patient's son reports that she is actually been doing pretty well at home up until of her fall yesterday.  No other falls at home.  He reports that he believes that the patient just got up too quickly and did not do as she was instructed to stand and wait 30 seconds before trying to walk.  Patient son states that  he is comfortable with her being discharged back home if everything looks okay.  Patient's son asked me to not give the patient narcotic medication at home because she abuses narcotic medication.   [KM]  1287 Imaging show no acute cspine or intracranial process. Possible acute L2 L1 compression fracture. She is tender throughout entire L spine on exam but mostly complains of hip pain. No definite hip fx on xray but she has had bilateral hip rods in place that are intact on xray.    [KM]  8676 Patient was able to transition from the bed to her walker and walk around the room with her pain being controlled.  However, patient felt dizzy at that time.  We checked orthostatics and patient's orthostatics were positive.  We will give her  some fluids and then re-evaluate. If better, patient and her son feel comfortable with going back home. If unable to improve she may need to be admitted for more fluids.    [KM]    Clinical Course User Index [KM] Alveria Apley, PA-C         Final Clinical Impressions(s) / ED Diagnoses   Final diagnoses:  Fall, initial encounter  Pain of left hip joint  Orthostatic hypotension    ED Discharge Orders    None       Kristine Royal 07/01/18 0725    Malvin Johns, MD 07/02/18 1235

## 2018-06-27 NOTE — ED Notes (Addendum)
AMBULATING: Pt able to get out of bed unassisted, but verbally and visually struggled to do so. She walked around room using walker, unassisted. Once pt stood up and started walking, she c/o "dizziness." Pt stopped twice while walking and had to recoup from "increased dizziness". Was able to sit in chair for a few minutes, but then wanted to walk back to bed. C/o increased dizziness while walking again. ED-PA informed.

## 2018-06-30 ENCOUNTER — Ambulatory Visit (HOSPITAL_BASED_OUTPATIENT_CLINIC_OR_DEPARTMENT_OTHER): Payer: Medicare Other | Admitting: Physical Medicine & Rehabilitation

## 2018-06-30 ENCOUNTER — Other Ambulatory Visit: Payer: Self-pay

## 2018-06-30 ENCOUNTER — Encounter: Payer: Self-pay | Admitting: Physical Medicine & Rehabilitation

## 2018-06-30 ENCOUNTER — Encounter: Payer: Medicare Other | Attending: Physical Medicine & Rehabilitation

## 2018-06-30 VITALS — BP 181/88 | HR 76 | Ht 65.0 in | Wt 122.0 lb

## 2018-06-30 DIAGNOSIS — I255 Ischemic cardiomyopathy: Secondary | ICD-10-CM | POA: Diagnosis not present

## 2018-06-30 DIAGNOSIS — I69398 Other sequelae of cerebral infarction: Secondary | ICD-10-CM | POA: Diagnosis not present

## 2018-06-30 DIAGNOSIS — Z8679 Personal history of other diseases of the circulatory system: Secondary | ICD-10-CM

## 2018-06-30 DIAGNOSIS — I69393 Ataxia following cerebral infarction: Secondary | ICD-10-CM | POA: Diagnosis not present

## 2018-06-30 DIAGNOSIS — I4819 Other persistent atrial fibrillation: Secondary | ICD-10-CM | POA: Insufficient documentation

## 2018-06-30 DIAGNOSIS — J849 Interstitial pulmonary disease, unspecified: Secondary | ICD-10-CM | POA: Insufficient documentation

## 2018-06-30 DIAGNOSIS — R269 Unspecified abnormalities of gait and mobility: Secondary | ICD-10-CM | POA: Diagnosis not present

## 2018-06-30 DIAGNOSIS — Z79899 Other long term (current) drug therapy: Secondary | ICD-10-CM | POA: Insufficient documentation

## 2018-06-30 NOTE — Progress Notes (Signed)
Subjective:    Patient ID: Meredith Gonzalez, female    DOB: March 08, 1936, 83 y.o.   MRN: [id number]  HPI [place] on her buttocks 4/4 Pain below the belt on   PT 3 x per wk- practicing ambulation in kitchen OT once per week, just starting tomorrow   Tylenol helps a little Laying on heating pad which helps  Patient states that she has seen cardiology as well as her primary care physician regarding low blood pressure related falls. She is not taking any antihypertensive medications.  She states that at one point she was told that she was drinking too much and was in heart failure. Pain Inventory Average Pain 9 Pain Right Now 9 My pain is constant, dull and aching  In the last 24 hours, has pain interfered with the following? General activity ? Relation with others ? Enjoyment of life ? What TIME of day is your pain at its worst? all Sleep (in general) Poor  Pain is worse with: walking and standing Pain improves with: rest, heat/ice and medication Relief from Meds: 2  Mobility walk with assistance use a walker  Function retired  Neuro/Psych trouble walking dizziness  Prior Studies hospital follow up  Physicians involved in your care hospital follow up   Family History  Problem Relation Age of Onset  . Congestive Heart Failure Mother   . Heart attack Father   . Alcoholism Father   . Alcoholism Sister   . Alcoholism Brother   . Alcoholism Brother   . Throat cancer Brother    Social History   Socioeconomic History  . Marital status: Widowed    Spouse name: Not on file  . Number of children: Not on file  . Years of education: Not on file  . Highest education level: Not on file  Occupational History  . Not on file  Social Needs  . Financial resource strain: Not hard at all  . Food insecurity:    Worry: Never true    Inability: Never true  . Transportation needs:    Medical: No    Non-medical: No  Tobacco Use  . Smoking status: Former Smoker   Packs/day: 0.50    Years: 4.00    Pack years: 2.00    Types: Cigarettes    Last attempt to quit: 08/07/1972    Years since quitting: 45.9  . Smokeless tobacco: Never Used  Substance and Sexual Activity  . Alcohol use: Yes    Frequency: Never    Comment: once or twice a year  . Drug use: Never  . Sexual activity: Not on file  Lifestyle  . Physical activity:    Days per week: 0 days    Minutes per session: 0 min  . Stress: Only a little  Relationships  . Social connections:    Talks on phone: Three times a week    Gets together: Three times a week    Attends religious service: More than 4 times per year    Active member of club or organization: No    Attends meetings of clubs or organizations: Never    Relationship status: Widowed  Other Topics Concern  . Not on file  Social History Narrative  . Not on file   Past Surgical History:  Procedure Laterality Date  . ABDOMINAL HYSTERECTOMY    . ANKLE FRACTURE SURGERY Right   . BASAL CELL CARCINOMA EXCISION Left    "leg"  . CARDIAC CATHETERIZATION  ~ 2014  . CARDIOVERSION N/A 02/13/2018  Procedure: CARDIOVERSION;  Surgeon: Sueanne Margarita, MD;  Location: [facility] ENDOSCOPY;  Service: Cardiovascular;  Laterality: N/A;  . CARDIOVERSION N/A 03/27/2018   Procedure: CARDIOVERSION;  Surgeon: Lelon Perla, MD;  Location: [facility] ENDOSCOPY;  Service: Cardiovascular;  Laterality: N/A;  . CARPAL TUNNEL RELEASE     bilateral  . CATARACT EXTRACTION W/ INTRAOCULAR LENS  IMPLANT, BILATERAL Bilateral   . CHOLECYSTECTOMY OPEN    . COLONOSCOPY    . CORONARY ANGIOPLASTY WITH STENT PLACEMENT  06/2016  . CORONARY STENT INTERVENTION N/A 07/19/2016   Procedure: Coronary Stent Intervention;  Surgeon: Peter M Martinique, MD;  Location: [place] CV LAB;  Service: Cardiovascular;  Laterality: N/A;  . ENDARTERECTOMY Right 06/03/2018   Procedure: ENDARTERECTOMY CAROTID;  Surgeon: Rosetta Posner, MD;  Location: [place];  Service: Vascular;  Laterality: Right;  . EYE  SURGERY     right eye catarace/lens implant  . FEMUR FRACTURE SURGERY Bilateral   . FRACTURE SURGERY    . GASTRIC BYPASS    . INSERT / REPLACE / REMOVE PACEMAKER  ~ 2012  . LEAD REVISION N/A 01/01/2013   Procedure: LEAD REVISION;  Surgeon: Evans Lance, MD;  Location: [facility] CATH LAB;  Service: Cardiovascular;  Laterality: N/A;  . LEFT HEART CATH AND CORONARY ANGIOGRAPHY N/A 07/19/2016   Procedure: Left Heart Cath and Coronary Angiography;  Surgeon: Peter M Martinique, MD;  Location: [place] CV LAB;  Service: Cardiovascular;  Laterality: N/A;  . OVARIAN CYST SURGERY     "one side only"  . PATCH ANGIOPLASTY Right 06/03/2018   Procedure: PATCH ANGIOPLASTY USING HEMASHIELD PLATINUM FINESSE;  Surgeon: Rosetta Posner, MD;  Location: [place];  Service: Vascular;  Laterality: Right;  . TEE WITHOUT CARDIOVERSION N/A 02/13/2018   Procedure: TRANSESOPHAGEAL ECHOCARDIOGRAM (TEE);  Surgeon: Sueanne Margarita, MD;  Location: [facility] ENDOSCOPY;  Service: Cardiovascular;  Laterality: N/A;  . TONSILLECTOMY     Past Medical History:  Diagnosis Date  . Anemia   . Anxiety   . Arthritis    "all over" (02/11/2018)  . Basal cell carcinoma    "left leg" (02/11/2018)  . Cervical spine fracture ([place]) 12/2017   "C1-2"  . CHF (congestive heart failure) ([place])   . Chronic bronchitis ([place])   . Chronic neck pain    "since I broke my neck 6-8 wk ago" (02/11/2018)  . Diabetic peripheral neuropathy ([place])   . Fibromyalgia   . Fracture of right humerus   . Generalized weakness   . Headache    "weekly" (02/11/2018)  . History of blood transfusion    "related to one of my femur surgeries" (02/11/2018)  . History of echocardiogram    Echo 8/18: EF 50-55, no RWMA, Gr 1 DD, calcified AV leaflets, MAC, trivial MR, mod LAE, PASP 37  . Hypertension   . Hypothyroidism   . Incontinence of urine   . Ischemic stroke ([place]) 2000   "lost part of the vision in my right eye" (02/11/2018)  . Major depression, chronic   . Myocardial  infarction ([place]) 06/2016  . Pacemaker   . Recurrent falls   . Syncope and collapse   . Type 2 diabetes, diet controlled ([facility])    BP (!) 181/88 Comment: taken at home by son  Pulse 45 Comment: taken at home by son  Ht _0  (1.651 m)   Wt 122 lb (55.3 kg)   BMI 20.30 kg/m   Opioid Risk Score:   Fall Risk Score:  `1  Depression  screen PHQ 2/9  Depression screen PHQ 2/9 05/19/2018  Decreased Interest 1  Down, Depressed, Hopeless 1  PHQ - 2 Score 2  Altered sleeping 3  Tired, decreased energy 2  Change in appetite 0  Feeling bad or failure about yourself  3  Trouble concentrating 0  Moving slowly or fidgety/restless 0  Suicidal thoughts 0  PHQ-9 Score 10  Difficult doing work/chores Somewhat difficult    Review of Systems  Constitutional: Negative.   HENT: Negative.   Eyes: Negative.   Respiratory: Negative.   Cardiovascular: Negative.   Gastrointestinal: Negative.   Endocrine: Negative.   Genitourinary: Negative.   Musculoskeletal: Positive for back pain and gait problem.  Allergic/Immunologic: Negative.   Neurological: Positive for dizziness.  Hematological: Negative.   Psychiatric/Behavioral: Negative.   All other systems reviewed and are negative.      Objective:   Physical Exam Virtual visit       Assessment & Plan:  1.  History of CVA during inpatient stay she had evidence of left hemiparesis but no radiologic signs of infarct or hemorrhage on CT of the head performed on multiple occasions over the past couple months. She is not a candidate for MRI due to pacemaker She is back to her baseline functionally but still has her chronic issue with falls.  During her hospitalization TED hose were placed but she states she cannot get them on.  She has had a ED visit with a fall and the radiologic abnormalities do not correspond with her location of pain therefore they are likely old. Would not prescribe narcotic analgesics, previously had conversation with son  who is also on the call and he stated that she has had problems with regulating her use of narcotic analgesic medications in the past. I encouraged her to make an appointment with her cardiologist regarding her blood pressure I am not sure whether she needs to be on Florinef or ProAmatine  Spent approximately 20 minutes on call

## 2018-07-01 ENCOUNTER — Telehealth: Payer: Self-pay | Admitting: Physical Medicine & Rehabilitation

## 2018-07-01 ENCOUNTER — Other Ambulatory Visit: Payer: Self-pay | Admitting: *Deleted

## 2018-07-01 NOTE — Telephone Encounter (Signed)
I do not see orders for next visit? When should she come back?

## 2018-07-01 NOTE — Patient Outreach (Signed)
[facility]) Care Management  07/01/2018  Meredith Gonzalez May 06, 1935 [id number]   EMMI- stroke  RED ON EMMI ALERT Day # 6 Date: Tuesday 06/30/18 1003  Red Alert Reason: Feeling worse overall? Yes   Insurance: medicare [employer] choice medicare supplement coverage [facility] admissions x 5 ED visits x 3 in the last 6 months  last ED visit on on 06/27/18 for fall  Last hospitalization 05/30/18 to 06/05/18 then inpatient rehab admissions 06/05/18 to 06/24/2018 for CVA   Outreach attempt # successful  Patient is able to verify HIPAA [place] Management RN reviewed and addressed red alert with patient   EMMI:  Meredith Gonzalez reports the EMMI answer was correct but she she is better today She reports "my butt was hurting more and it was difficult to get up and walk." she reports she and her son had a conference call with Dr Letta Pate yesterday 06/30/18 who recommended applying bengay to her left buttocks. She reports this has helped  She confirms she is being seen by [facility] OT MD and ED MD notes discusses concern with narcotic use at home and son preference in narcotics not being prescribed  Social: Meredith Gonzalez is widowed and lives alone with visits from her son, Lennette Bihari who comes by to assist. 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, pacemaker, falls last on 06/27/18 (was standing up from the kitchen table and went to go grab her walker but then fell backwards landed on her hip), atrial fibrillation on eliquis, chronic neck pain, acute blood loss anemia, CHF, HTN, cervical stenosis with chronic pain, DM type 2,   DME: walker  Falls 06/27/18, 05/28/18 or 05/30/18  Medications: She denies concerns with taking medications as prescribed, affording medications, side effects of medications and questions about medications  Appointments: had 06/30/18 virtual visit with Dr Ella Bodo and next visit on Aug 18 2018 CVD  device check on September 17 2018    Advance Directives:  Has a POA    Consent: [facility] RN CM reviewed [facility] services with patient. Patient gave verbal consent for services [facility] telephonic RN CM.   Advised patient that there will be further automated EMMI- post discharge calls to assess how the patient is doing following the recent hospitalization Advised the patient that another call may be received from a nurse if any of their responses were abnormal. Patient voiced understanding and was appreciative of f/u call.   Plan: [facility] RN CM will close case at this time as patient has been assessed and no needs identified/needs resolved.   Pt encouraged to return a call to [place] CM prn  [facility] RN CM sent a successful outreach letter as discussed with [facility] brochure enclosed for review  Kimberly L. Lavina Hamman, RN, BSN, [place] Coordinator Office number [phone] Mobile number [phone]  Main [facility] number [phone] Fax number [phone]

## 2018-07-01 NOTE — Telephone Encounter (Signed)
[id number]

## 2018-07-09 ENCOUNTER — Inpatient Hospital Stay (HOSPITAL_BASED_OUTPATIENT_CLINIC_OR_DEPARTMENT_OTHER)
Admission: EM | Admit: 2018-07-09 | Discharge: 2018-07-13 | DRG: 069 | Disposition: A | Discharge status: Skilled Nursing Facility | Payer: Medicare Other | Attending: Internal Medicine | Admitting: Internal Medicine

## 2018-07-09 ENCOUNTER — Emergency Department (HOSPITAL_BASED_OUTPATIENT_CLINIC_OR_DEPARTMENT_OTHER): Payer: Medicare Other

## 2018-07-09 ENCOUNTER — Other Ambulatory Visit: Payer: Self-pay

## 2018-07-09 ENCOUNTER — Encounter (HOSPITAL_BASED_OUTPATIENT_CLINIC_OR_DEPARTMENT_OTHER): Payer: Self-pay | Admitting: Emergency Medicine

## 2018-07-09 DIAGNOSIS — R531 Weakness: Secondary | ICD-10-CM

## 2018-07-09 DIAGNOSIS — I4819 Other persistent atrial fibrillation: Secondary | ICD-10-CM | POA: Diagnosis present

## 2018-07-09 DIAGNOSIS — G459 Transient cerebral ischemic attack, unspecified: Secondary | ICD-10-CM | POA: Diagnosis not present

## 2018-07-09 DIAGNOSIS — M797 Fibromyalgia: Secondary | ICD-10-CM | POA: Diagnosis present

## 2018-07-09 DIAGNOSIS — I2510 Atherosclerotic heart disease of native coronary artery without angina pectoris: Secondary | ICD-10-CM | POA: Diagnosis present

## 2018-07-09 DIAGNOSIS — I5042 Chronic combined systolic (congestive) and diastolic (congestive) heart failure: Secondary | ICD-10-CM | POA: Diagnosis present

## 2018-07-09 DIAGNOSIS — M159 Polyosteoarthritis, unspecified: Secondary | ICD-10-CM | POA: Diagnosis present

## 2018-07-09 DIAGNOSIS — I63139 Cerebral infarction due to embolism of unspecified carotid artery: Secondary | ICD-10-CM | POA: Diagnosis present

## 2018-07-09 DIAGNOSIS — I951 Orthostatic hypotension: Secondary | ICD-10-CM | POA: Diagnosis present

## 2018-07-09 DIAGNOSIS — J42 Unspecified chronic bronchitis: Secondary | ICD-10-CM | POA: Diagnosis present

## 2018-07-09 DIAGNOSIS — I255 Ischemic cardiomyopathy: Secondary | ICD-10-CM | POA: Diagnosis present

## 2018-07-09 DIAGNOSIS — Z808 Family history of malignant neoplasm of other organs or systems: Secondary | ICD-10-CM

## 2018-07-09 DIAGNOSIS — Z955 Presence of coronary angioplasty implant and graft: Secondary | ICD-10-CM

## 2018-07-09 DIAGNOSIS — Z87891 Personal history of nicotine dependence: Secondary | ICD-10-CM

## 2018-07-09 DIAGNOSIS — I69354 Hemiplegia and hemiparesis following cerebral infarction affecting left non-dominant side: Secondary | ICD-10-CM

## 2018-07-09 DIAGNOSIS — Z950 Presence of cardiac pacemaker: Secondary | ICD-10-CM

## 2018-07-09 DIAGNOSIS — Z9884 Bariatric surgery status: Secondary | ICD-10-CM

## 2018-07-09 DIAGNOSIS — E039 Hypothyroidism, unspecified: Secondary | ICD-10-CM | POA: Diagnosis present

## 2018-07-09 DIAGNOSIS — M8448XS Pathological fracture, other site, sequela: Secondary | ICD-10-CM | POA: Diagnosis present

## 2018-07-09 DIAGNOSIS — E785 Hyperlipidemia, unspecified: Secondary | ICD-10-CM | POA: Diagnosis present

## 2018-07-09 DIAGNOSIS — Z8673 Personal history of transient ischemic attack (TIA), and cerebral infarction without residual deficits: Secondary | ICD-10-CM

## 2018-07-09 DIAGNOSIS — Z811 Family history of alcohol abuse and dependence: Secondary | ICD-10-CM

## 2018-07-09 DIAGNOSIS — Z7901 Long term (current) use of anticoagulants: Secondary | ICD-10-CM

## 2018-07-09 DIAGNOSIS — R42 Dizziness and giddiness: Secondary | ICD-10-CM

## 2018-07-09 DIAGNOSIS — E1142 Type 2 diabetes mellitus with diabetic polyneuropathy: Secondary | ICD-10-CM | POA: Diagnosis present

## 2018-07-09 DIAGNOSIS — I252 Old myocardial infarction: Secondary | ICD-10-CM

## 2018-07-09 DIAGNOSIS — Z79890 Hormone replacement therapy (postmenopausal): Secondary | ICD-10-CM

## 2018-07-09 DIAGNOSIS — E1151 Type 2 diabetes mellitus with diabetic peripheral angiopathy without gangrene: Secondary | ICD-10-CM | POA: Diagnosis present

## 2018-07-09 DIAGNOSIS — I639 Cerebral infarction, unspecified: Secondary | ICD-10-CM | POA: Diagnosis present

## 2018-07-09 DIAGNOSIS — Z885 Allergy status to narcotic agent status: Secondary | ICD-10-CM

## 2018-07-09 DIAGNOSIS — I110 Hypertensive heart disease with heart failure: Secondary | ICD-10-CM | POA: Diagnosis present

## 2018-07-09 DIAGNOSIS — Z8249 Family history of ischemic heart disease and other diseases of the circulatory system: Secondary | ICD-10-CM

## 2018-07-09 DIAGNOSIS — Z66 Do not resuscitate: Secondary | ICD-10-CM | POA: Diagnosis present

## 2018-07-09 DIAGNOSIS — Z7951 Long term (current) use of inhaled steroids: Secondary | ICD-10-CM

## 2018-07-09 DIAGNOSIS — I5030 Unspecified diastolic (congestive) heart failure: Secondary | ICD-10-CM | POA: Diagnosis present

## 2018-07-09 DIAGNOSIS — Z85828 Personal history of other malignant neoplasm of skin: Secondary | ICD-10-CM

## 2018-07-09 DIAGNOSIS — E1143 Type 2 diabetes mellitus with diabetic autonomic (poly)neuropathy: Secondary | ICD-10-CM | POA: Diagnosis present

## 2018-07-09 DIAGNOSIS — Z888 Allergy status to other drugs, medicaments and biological substances status: Secondary | ICD-10-CM

## 2018-07-09 LAB — URINALYSIS, ROUTINE W REFLEX MICROSCOPIC
Bilirubin Urine: NEGATIVE
Glucose, UA: NEGATIVE mg/dL
Hgb urine dipstick: NEGATIVE
Ketones, ur: NEGATIVE mg/dL
Nitrite: NEGATIVE
Protein, ur: NEGATIVE mg/dL
Specific Gravity, Urine: 1.02 (ref 1.005–1.030)
pH: 6.5 (ref 5.0–8.0)

## 2018-07-09 LAB — CBC WITH DIFFERENTIAL/PLATELET
Abs Immature Granulocytes: 0.07 10*3/uL (ref 0.00–0.07)
Basophils Absolute: 0 10*3/uL (ref 0.0–0.1)
Basophils Relative: 0 %
Eosinophils Absolute: 0 10*3/uL (ref 0.0–0.5)
Eosinophils Relative: 1 %
HCT: 39.4 % (ref 36.0–46.0)
Hemoglobin: 12.1 g/dL (ref 12.0–15.0)
Immature Granulocytes: 1 %
Lymphocytes Relative: 12 %
Lymphs Abs: 1 10*3/uL (ref 0.7–4.0)
MCH: 32.7 pg (ref 26.0–34.0)
MCHC: 30.7 g/dL (ref 30.0–36.0)
MCV: 106.5 fL — ABNORMAL HIGH (ref 80.0–100.0)
Monocytes Absolute: 0.6 10*3/uL (ref 0.1–1.0)
Monocytes Relative: 7 %
Neutro Abs: 6.5 10*3/uL (ref 1.7–7.7)
Neutrophils Relative %: 79 %
Platelets: 324 10*3/uL (ref 150–400)
RBC: 3.7 MIL/uL — ABNORMAL LOW (ref 3.87–5.11)
RDW: 16.4 % — ABNORMAL HIGH (ref 11.5–15.5)
WBC: 8.2 10*3/uL (ref 4.0–10.5)
nRBC: 0 % (ref 0.0–0.2)

## 2018-07-09 LAB — BRAIN NATRIURETIC PEPTIDE: B Natriuretic Peptide: 135.1 pg/mL — ABNORMAL HIGH (ref 0.0–100.0)

## 2018-07-09 LAB — URINALYSIS, MICROSCOPIC (REFLEX): RBC / HPF: NONE SEEN RBC/hpf (ref 0–5)

## 2018-07-09 LAB — BASIC METABOLIC PANEL
Anion gap: 6 (ref 5–15)
BUN: 22 mg/dL (ref 8–23)
CO2: 20 mmol/L — ABNORMAL LOW (ref 22–32)
Calcium: 8.5 mg/dL — ABNORMAL LOW (ref 8.9–10.3)
Chloride: 110 mmol/L (ref 98–111)
Creatinine, Ser: 0.69 mg/dL (ref 0.44–1.00)
GFR calc Af Amer: >60 mL/min (ref >60)
GFR calc non Af Amer: >60 mL/min (ref >60)
Glucose, Bld: 115 mg/dL — ABNORMAL HIGH (ref 70–99)
Potassium: 4.1 mmol/L (ref 3.5–5.1)
Sodium: 136 mmol/L (ref 135–145)

## 2018-07-09 LAB — TROPONIN I: Troponin I: <0.03 ng/mL (ref <0.03)

## 2018-07-09 MED ORDER — SODIUM CHLORIDE 0.9 % IV BOLUS
500.0000 mL | Freq: Once | INTRAVENOUS | Status: AC
  Administered 2018-07-09: 500 mL via INTRAVENOUS

## 2018-07-09 MED ORDER — ACETAMINOPHEN 325 MG PO TABS
650.0000 mg | ORAL_TABLET | Freq: Once | ORAL | Status: AC
  Administered 2018-07-09: 650 mg via ORAL
  Filled 2018-07-09: qty 2

## 2018-07-09 MED ORDER — MECLIZINE HCL 25 MG PO TABS
25.0000 mg | ORAL_TABLET | Freq: Once | ORAL | Status: AC
  Administered 2018-07-09: 25 mg via ORAL
  Filled 2018-07-09: qty 1

## 2018-07-09 NOTE — ED Notes (Signed)
Pt on monitor 

## 2018-07-09 NOTE — ED Notes (Signed)
Pt. Son at bedside helping with Pt. During assessment.  Pt. Son left after assessment done by EDP and RN .Marland Kitchen  Pt. Son name is Dallys Nowakowski phone number is [phone]  Wants phone call after Pt. Is done in hospital or with what the plan for Pt. May be.

## 2018-07-09 NOTE — ED Notes (Signed)
Dr. Stark Jock speaking with Pt. Son Lavonya Hoerner about the Pt. And her admission

## 2018-07-09 NOTE — ED Notes (Signed)
Helped Pt. With using bedpan and cleaned Pt. After use.  Pt. Tolerated well.  Pt. Is alert and oriented well.  Not distress noted.

## 2018-07-09 NOTE — ED Notes (Signed)
Per Pt. And Per Pt. Son the Pt. Has had 2 strokes in recent past.  Pt. Is not exhibiting any deficits from past strokes.

## 2018-07-09 NOTE — H&P (Addendum)
Pt with h/o recent strokes, on eliquis, CHF, PPM, HTN, CEA in March.  In with dizziness and generalized weakness onset today.  SBP 199 today.  Concerned about poss posterior stroke vs hypertensive urgency.  Will send to tele obs.  Might want to obtain CT perfusion study / talk with neuro on arrival.

## 2018-07-09 NOTE — ED Triage Notes (Signed)
Per son pt is having some fatigue, increase weakness and loss of balance for the last few hour with some High BP.

## 2018-07-09 NOTE — ED Provider Notes (Signed)
[place] EMERGENCY DEPARTMENT Provider Note   CSN: [id number] Arrival date & time: 07/09/18  2048    History   Chief Complaint Chief Complaint  Patient presents with  . Fatigue    HPI Meredith Gonzalez is a 83 y.o. female.     Patient is an 83 year old female with history of recent CVA, CHF, pacemaker, hypertension.  She presents today for evaluation of dizziness.  This evening she began to feel unsteady and as if she was going to fall.  She denies any headache, visual disturbances, or weakness in her arms or legs.  She denies any recent fever, shortness of breath, or cough.  Patient was admitted at [facility] the end of March for stroke.  The history is provided by the patient.    Past Medical History:  Diagnosis Date  . Anemia   . Anxiety   . Arthritis    "all over" (02/11/2018)  . Basal cell carcinoma    "left leg" (02/11/2018)  . Cervical spine fracture ([place]) 12/2017   "C1-2"  . CHF (congestive heart failure) ([place])   . Chronic bronchitis ([place])   . Chronic neck pain    "since I broke my neck 6-8 wk ago" (02/11/2018)  . Diabetic peripheral neuropathy ([place])   . Fibromyalgia   . Fracture of right humerus   . Generalized weakness   . Headache    "weekly" (02/11/2018)  . History of blood transfusion    "related to one of my femur surgeries" (02/11/2018)  . History of echocardiogram    Echo 8/18: EF 50-55, no RWMA, Gr 1 DD, calcified AV leaflets, MAC, trivial MR, mod LAE, PASP 37  . Hypertension   . Hypothyroidism   . Incontinence of urine   . Ischemic stroke ([name]) 2000   "lost part of the vision in my right eye" (02/11/2018)  . Major depression, chronic   . Myocardial infarction ([place]) 06/2016  . Pacemaker   . Recurrent falls   . Syncope and collapse   . Type 2 diabetes, diet controlled [facility])     Patient Active Problem List   Diagnosis Date Noted  . Stroke due to embolism of carotid artery ([place]) 06/05/2018  . Stroke-like symptom   . Dyspnea   .  Acute blood loss anemia   . Acute ischemic stroke ([place]) 05/31/2018  . Stroke ([place]) 05/30/2018  . Chronic diastolic congestive heart failure ([place])   . Anemia of chronic disease   . Hypoalbuminemia due to protein-calorie malnutrition ([place])   . Labile blood pressure   . Atrial fibrillation ([place])   . Chronic neck pain   . Subcortical infarction ([place]) 04/27/2018  . CVA (cerebral vascular accident) ([place]) 04/26/2018  . TIA (transient ischemic attack) 04/24/2018  . CAD (coronary artery disease) 08/07/2016  . (HFpEF) heart failure with preserved ejection fraction ([place]) 08/07/2016  . Ischemic cardiomyopathy 08/07/2016  . Chest pain   . NSTEMI (non-ST elevated myocardial infarction) ([place])   . Status post coronary artery stent placement   . Abnormal stress test   . SOB (shortness of breath)   . Multifocal pneumonia 07/13/2016  . Elevated troponin 07/13/2016  . Leukocytosis   . Pacemaker 05/17/2014  . Complications, pacemaker cardiac, mechanical 12/31/2012  . Edema extremities 10/08/2012  . Persistent atrial fibrillation 10/08/2012  . Hypertension 10/08/2012  . Hypothyroidism 10/08/2012  . Neuropathy 10/08/2012  . Diabetes ([place]) 10/08/2012  . Depression 09/08/2012  . Anemia 08/08/2012  . Knee fracture, left 08/07/2012  . Recurrent falls  08/07/2012  . Syncope 08/07/2012  . SDH (subdural hematoma) ([place]) 08/07/2012    Past Surgical History:  Procedure Laterality Date  . ABDOMINAL HYSTERECTOMY    . ANKLE FRACTURE SURGERY Right   . BASAL CELL CARCINOMA EXCISION Left    "leg"  . CARDIAC CATHETERIZATION  ~ 2014  . CARDIOVERSION N/A 02/13/2018   Procedure: CARDIOVERSION;  Surgeon: Sueanne Margarita, MD;  Location: [facility] ENDOSCOPY;  Service: Cardiovascular;  Laterality: N/A;  . CARDIOVERSION N/A 03/27/2018   Procedure: CARDIOVERSION;  Surgeon: Lelon Perla, MD;  Location: [facility] ENDOSCOPY;  Service: Cardiovascular;  Laterality: N/A;  . CARPAL TUNNEL RELEASE     bilateral  . CATARACT  EXTRACTION W/ INTRAOCULAR LENS  IMPLANT, BILATERAL Bilateral   . CHOLECYSTECTOMY OPEN    . COLONOSCOPY    . CORONARY ANGIOPLASTY WITH STENT PLACEMENT  06/2016  . CORONARY STENT INTERVENTION N/A 07/19/2016   Procedure: Coronary Stent Intervention;  Surgeon: Peter M Martinique, MD;  Location: [place] CV LAB;  Service: Cardiovascular;  Laterality: N/A;  . ENDARTERECTOMY Right 06/03/2018   Procedure: ENDARTERECTOMY CAROTID;  Surgeon: Rosetta Posner, MD;  Location: [place];  Service: Vascular;  Laterality: Right;  . EYE SURGERY     right eye catarace/lens implant  . FEMUR FRACTURE SURGERY Bilateral   . FRACTURE SURGERY    . GASTRIC BYPASS    . INSERT / REPLACE / REMOVE PACEMAKER  ~ 2012  . LEAD REVISION N/A 01/01/2013   Procedure: LEAD REVISION;  Surgeon: Evans Lance, MD;  Location: [facility] CATH LAB;  Service: Cardiovascular;  Laterality: N/A;  . LEFT HEART CATH AND CORONARY ANGIOGRAPHY N/A 07/19/2016   Procedure: Left Heart Cath and Coronary Angiography;  Surgeon: Peter M Martinique, MD;  Location: [place] CV LAB;  Service: Cardiovascular;  Laterality: N/A;  . OVARIAN CYST SURGERY     "one side only"  . PATCH ANGIOPLASTY Right 06/03/2018   Procedure: PATCH ANGIOPLASTY USING HEMASHIELD PLATINUM FINESSE;  Surgeon: Rosetta Posner, MD;  Location: [place];  Service: Vascular;  Laterality: Right;  . TEE WITHOUT CARDIOVERSION N/A 02/13/2018   Procedure: TRANSESOPHAGEAL ECHOCARDIOGRAM (TEE);  Surgeon: Sueanne Margarita, MD;  Location: [facility] ENDOSCOPY;  Service: Cardiovascular;  Laterality: N/A;  . TONSILLECTOMY       OB History   No obstetric history on file.      Home Medications    Prior to Admission medications   Medication Sig Start Date End Date Taking? Authorizing Provider  acetaminophen (TYLENOL) 500 MG tablet Take 500 mg by mouth every 6 (six) hours as needed for moderate pain.     [provider]  albuterol (PROVENTIL HFA;VENTOLIN HFA) 108 (90 Base) MCG/ACT inhaler Inhale 2 puffs into the  lungs every 6 (six) hours as needed for wheezing or shortness of breath. 05/07/18   Angiulli, Lavon Paganini, PA-C  amiodarone (PACERONE) 200 MG tablet Take 1 tablet (200 mg total) by mouth every morning. Patient taking differently: Take 200 mg by mouth daily.  05/07/18   Angiulli, Lavon Paganini, PA-C  apixaban (ELIQUIS) 5 MG TABS tablet Take 1 tablet (5 mg total) by mouth 2 (two) times daily. 06/05/18   Dessa Phi, DO  atorvastatin (LIPITOR) 20 MG tablet Take 1 tablet (20 mg total) by mouth daily for 30 days. 06/05/18 07/05/18  Dessa Phi, DO  budesonide-formoterol (SYMBICORT) 160-4.5 MCG/ACT inhaler Inhale 2 puffs into the lungs 2 (two) times daily. 05/20/18   Mannam, Hart Robinsons, MD  docusate sodium (COLACE) 100 MG capsule Take  1 capsule (100 mg total) by mouth daily. 06/24/18   Love, Ivan Anchors, PA-C  ferrous sulfate 325 (65 FE) MG tablet Take 1 tablet (325 mg total) by mouth 2 (two) times daily with a meal. 05/07/18   Angiulli, Lavon Paganini, PA-C  flintstones complete (FLINTSTONES) 60 MG chewable tablet Chew 1 tablet by mouth daily.    [provider]  gabapentin (NEURONTIN) 300 MG capsule Take 1-2 capsules (300-600 mg total) by mouth See admin instructions. Take 1 capsule (300 mg) by mouth in the mornning & take 2 capsules (600 mg) by mouth at night Patient taking differently: Take 300-600 mg by mouth See admin instructions. Take 1 capsule (300 mg) by mouth in the morning & take 2 capsules (600 mg) by mouth at night 05/07/18   Angiulli, Lavon Paganini, PA-C  levothyroxine (SYNTHROID, LEVOTHROID) 75 MCG tablet Take 1 tablet (75 mcg total) by mouth daily at 6 (six) AM. 06/24/18   Love, Ivan Anchors, PA-C  nitroGLYCERIN (NITROSTAT) 0.4 MG SL tablet Place 1 tablet (0.4 mg total) under the tongue every 5 (five) minutes as needed for chest pain. Patient not taking: Reported on 05/30/2018 10/11/16 03/17/18  Fay Records, MD  pantoprazole (PROTONIX) 40 MG tablet Take 1 tablet (40 mg total) by mouth daily. 06/24/18   Love, Ivan Anchors,  PA-C  sertraline (ZOLOFT) 50 MG tablet Take 1 tablet (50 mg total) by mouth daily. 06/24/18   Love, Ivan Anchors, PA-C  vitamin B-12 (CYANOCOBALAMIN) 1000 MCG tablet Take 1 tablet (1,000 mcg total) by mouth daily. 07/20/16   Thurnell Lose, MD    Family History Family History  Problem Relation Age of Onset  . Congestive Heart Failure Mother   . Heart attack Father   . Alcoholism Father   . Alcoholism Sister   . Alcoholism Brother   . Alcoholism Brother   . Throat cancer Brother     Social History Social History   Tobacco Use  . Smoking status: Former Smoker    Packs/day: 0.50    Years: 4.00    Pack years: 2.00    Types: Cigarettes    Last attempt to quit: 08/07/1972    Years since quitting: 45.9  . Smokeless tobacco: Never Used  Substance Use Topics  . Alcohol use: Yes    Frequency: Never    Comment: once or twice a year  . Drug use: Never     Allergies   Valium [diazepam]; Tramadol; Codeine; Darifenacin; Darvon [propoxyphene hcl]; Daypro [oxaprozin]; Enablex [darifenacin hydrobromide er]; Oxycodone; Propoxyphene; Risperdal [risperidone]; Talwin [pentazocine]; and Vicodin [hydrocodone-acetaminophen]   Review of Systems Review of Systems  All other systems reviewed and are negative.    Physical Exam Updated Vital Signs BP (!) 199/75 (BP Location: Right Arm)   Pulse 70   Temp 97.9 F (36.6 C) (Oral)   Resp 16   Ht _0  (1.651 m)   Wt 55.3 kg   SpO2 99%   BMI 20.30 kg/m   Physical Exam Vitals signs and nursing note reviewed.  Constitutional:      General: She is not in acute distress.    Appearance: She is well-developed. She is not diaphoretic.  HENT:     Head: Normocephalic and atraumatic.     Mouth/Throat:     Mouth: Mucous membranes are moist.  Neck:     Musculoskeletal: Normal range of motion and neck supple.  Cardiovascular:     Rate and Rhythm: Normal rate and regular rhythm.  Heart sounds: No murmur. No friction rub. No gallop.    Pulmonary:     Effort: Pulmonary effort is normal. No respiratory distress.     Breath sounds: Normal breath sounds. No wheezing.  Abdominal:     General: Bowel sounds are normal. There is no distension.     Palpations: Abdomen is soft.     Tenderness: There is no abdominal tenderness.  Musculoskeletal: Normal range of motion.  Skin:    General: Skin is warm and dry.  Neurological:     General: No focal deficit present.     Mental Status: She is alert and oriented to person, place, and time.     Cranial Nerves: No cranial nerve deficit.     Motor: No weakness.     Coordination: Coordination normal.      ED Treatments / Results  Labs (all labs ordered are listed, but only abnormal results are displayed) Labs Reviewed  BASIC METABOLIC PANEL  CBC WITH DIFFERENTIAL/PLATELET  TROPONIN I  URINALYSIS, ROUTINE W REFLEX MICROSCOPIC    EKG None  Radiology No results found.  Procedures Procedures (including critical care time)  Medications Ordered in ED Medications  sodium chloride 0.9 % bolus 500 mL (has no administration in time range)     Initial Impression / Assessment and Plan / ED Course  I have reviewed the triage vital signs and the nursing notes.  Pertinent labs & imaging results that were available during my care of the patient were reviewed by me and considered in my medical decision making (see chart for details).  Patient presenting over here with complaints of dizziness and instability on her feet.  This started abruptly this evening.  Patient recently was admitted for stroke.  Today's work-up reveals an unremarkable head CT and laboratory studies which are unchanged.  Patient was attempted to ambulate, however was unable to keep her balance and required maximum assistance.  My concern is of a posterior circulation CVA.  I have discussed the care with Dr. Alcario Drought from the hospitalist service who agrees to admit.  Final Clinical Impressions(s) / ED Diagnoses    Final diagnoses:  None    ED Discharge Orders    None       Veryl Speak, MD 07/09/18 2339

## 2018-07-09 NOTE — ED Notes (Signed)
Attempted to walk Pt. With walker in hall with assistance.  Pt. Very unsteady and had to be held up till placed in W/C by RN.  Pt. In no distress an placed by in stretcher with side rales up.

## 2018-07-10 ENCOUNTER — Observation Stay (HOSPITAL_COMMUNITY): Payer: Medicare Other

## 2018-07-10 ENCOUNTER — Encounter (HOSPITAL_COMMUNITY): Payer: Self-pay | Admitting: Internal Medicine

## 2018-07-10 DIAGNOSIS — Z8673 Personal history of transient ischemic attack (TIA), and cerebral infarction without residual deficits: Secondary | ICD-10-CM | POA: Diagnosis not present

## 2018-07-10 DIAGNOSIS — M797 Fibromyalgia: Secondary | ICD-10-CM | POA: Diagnosis present

## 2018-07-10 DIAGNOSIS — I255 Ischemic cardiomyopathy: Secondary | ICD-10-CM | POA: Diagnosis not present

## 2018-07-10 DIAGNOSIS — E1142 Type 2 diabetes mellitus with diabetic polyneuropathy: Secondary | ICD-10-CM | POA: Diagnosis present

## 2018-07-10 DIAGNOSIS — E1151 Type 2 diabetes mellitus with diabetic peripheral angiopathy without gangrene: Secondary | ICD-10-CM | POA: Diagnosis present

## 2018-07-10 DIAGNOSIS — Z955 Presence of coronary angioplasty implant and graft: Secondary | ICD-10-CM | POA: Diagnosis not present

## 2018-07-10 DIAGNOSIS — R531 Weakness: Secondary | ICD-10-CM | POA: Diagnosis present

## 2018-07-10 DIAGNOSIS — Z66 Do not resuscitate: Secondary | ICD-10-CM | POA: Diagnosis present

## 2018-07-10 DIAGNOSIS — Z8249 Family history of ischemic heart disease and other diseases of the circulatory system: Secondary | ICD-10-CM | POA: Diagnosis not present

## 2018-07-10 DIAGNOSIS — I4819 Other persistent atrial fibrillation: Secondary | ICD-10-CM | POA: Diagnosis not present

## 2018-07-10 DIAGNOSIS — Z7951 Long term (current) use of inhaled steroids: Secondary | ICD-10-CM | POA: Diagnosis not present

## 2018-07-10 DIAGNOSIS — I951 Orthostatic hypotension: Secondary | ICD-10-CM | POA: Diagnosis not present

## 2018-07-10 DIAGNOSIS — I5042 Chronic combined systolic (congestive) and diastolic (congestive) heart failure: Secondary | ICD-10-CM | POA: Diagnosis present

## 2018-07-10 DIAGNOSIS — I2510 Atherosclerotic heart disease of native coronary artery without angina pectoris: Secondary | ICD-10-CM | POA: Diagnosis present

## 2018-07-10 DIAGNOSIS — I480 Paroxysmal atrial fibrillation: Secondary | ICD-10-CM | POA: Diagnosis not present

## 2018-07-10 DIAGNOSIS — E039 Hypothyroidism, unspecified: Secondary | ICD-10-CM | POA: Diagnosis present

## 2018-07-10 DIAGNOSIS — J42 Unspecified chronic bronchitis: Secondary | ICD-10-CM | POA: Diagnosis present

## 2018-07-10 DIAGNOSIS — Z950 Presence of cardiac pacemaker: Secondary | ICD-10-CM | POA: Diagnosis not present

## 2018-07-10 DIAGNOSIS — I110 Hypertensive heart disease with heart failure: Secondary | ICD-10-CM | POA: Diagnosis present

## 2018-07-10 DIAGNOSIS — M159 Polyosteoarthritis, unspecified: Secondary | ICD-10-CM | POA: Diagnosis present

## 2018-07-10 DIAGNOSIS — Z79890 Hormone replacement therapy (postmenopausal): Secondary | ICD-10-CM | POA: Diagnosis not present

## 2018-07-10 DIAGNOSIS — Z85828 Personal history of other malignant neoplasm of skin: Secondary | ICD-10-CM | POA: Diagnosis not present

## 2018-07-10 DIAGNOSIS — Z7901 Long term (current) use of anticoagulants: Secondary | ICD-10-CM | POA: Diagnosis not present

## 2018-07-10 DIAGNOSIS — Z9884 Bariatric surgery status: Secondary | ICD-10-CM | POA: Diagnosis not present

## 2018-07-10 DIAGNOSIS — R42 Dizziness and giddiness: Secondary | ICD-10-CM | POA: Diagnosis not present

## 2018-07-10 DIAGNOSIS — I252 Old myocardial infarction: Secondary | ICD-10-CM | POA: Diagnosis not present

## 2018-07-10 DIAGNOSIS — E1143 Type 2 diabetes mellitus with diabetic autonomic (poly)neuropathy: Secondary | ICD-10-CM | POA: Diagnosis not present

## 2018-07-10 DIAGNOSIS — R030 Elevated blood-pressure reading, without diagnosis of hypertension: Secondary | ICD-10-CM | POA: Diagnosis not present

## 2018-07-10 DIAGNOSIS — I69354 Hemiplegia and hemiparesis following cerebral infarction affecting left non-dominant side: Secondary | ICD-10-CM | POA: Diagnosis not present

## 2018-07-10 DIAGNOSIS — G459 Transient cerebral ischemic attack, unspecified: Secondary | ICD-10-CM | POA: Diagnosis not present

## 2018-07-10 LAB — COMPREHENSIVE METABOLIC PANEL
ALT: 21 U/L (ref 0–44)
AST: 24 U/L (ref 15–41)
Albumin: 2.7 g/dL — ABNORMAL LOW (ref 3.5–5.0)
Alkaline Phosphatase: 152 U/L — ABNORMAL HIGH (ref 38–126)
Anion gap: 6 (ref 5–15)
BUN: 16 mg/dL (ref 8–23)
CO2: 21 mmol/L — ABNORMAL LOW (ref 22–32)
Calcium: 8.2 mg/dL — ABNORMAL LOW (ref 8.9–10.3)
Chloride: 111 mmol/L (ref 98–111)
Creatinine, Ser: 0.64 mg/dL (ref 0.44–1.00)
GFR calc Af Amer: >60 mL/min (ref >60)
GFR calc non Af Amer: >60 mL/min (ref >60)
Glucose, Bld: 110 mg/dL — ABNORMAL HIGH (ref 70–99)
Potassium: 3.9 mmol/L (ref 3.5–5.1)
Sodium: 138 mmol/L (ref 135–145)
Total Bilirubin: 0.3 mg/dL (ref 0.3–1.2)
Total Protein: 6.2 g/dL — ABNORMAL LOW (ref 6.5–8.1)

## 2018-07-10 LAB — CBC
HCT: 35.5 % — ABNORMAL LOW (ref 36.0–46.0)
Hemoglobin: 10.7 g/dL — ABNORMAL LOW (ref 12.0–15.0)
MCH: 31.8 pg (ref 26.0–34.0)
MCHC: 30.1 g/dL (ref 30.0–36.0)
MCV: 105.3 fL — ABNORMAL HIGH (ref 80.0–100.0)
Platelets: 295 10*3/uL (ref 150–400)
RBC: 3.37 MIL/uL — ABNORMAL LOW (ref 3.87–5.11)
RDW: 16 % — ABNORMAL HIGH (ref 11.5–15.5)
WBC: 8.5 10*3/uL (ref 4.0–10.5)
nRBC: 0 % (ref 0.0–0.2)

## 2018-07-10 LAB — LIPID PANEL
Cholesterol: 88 mg/dL (ref 0–200)
HDL: 52 mg/dL (ref >40)
LDL Cholesterol: 31 mg/dL (ref 0–99)
Total CHOL/HDL Ratio: 1.7 RATIO
Triglycerides: 26 mg/dL (ref <150)
VLDL: 5 mg/dL (ref 0–40)

## 2018-07-10 LAB — HEMOGLOBIN A1C
Hgb A1c MFr Bld: 5.1 % (ref 4.8–5.6)
Mean Plasma Glucose: 99.67 mg/dL

## 2018-07-10 LAB — MRSA PCR SCREENING: MRSA by PCR: NEGATIVE

## 2018-07-10 LAB — LACTIC ACID, PLASMA: Lactic Acid, Venous: 1.3 mmol/L (ref 0.5–1.9)

## 2018-07-10 LAB — CORTISOL: Cortisol, Plasma: 5.5 ug/dL

## 2018-07-10 MED ORDER — AMIODARONE HCL 200 MG PO TABS
200.0000 mg | ORAL_TABLET | Freq: Every day | ORAL | Status: DC
  Administered 2018-07-10 – 2018-07-13 (×4): 200 mg via ORAL
  Filled 2018-07-10 (×4): qty 1

## 2018-07-10 MED ORDER — ALBUTEROL SULFATE HFA 108 (90 BASE) MCG/ACT IN AERS: 2.0000 | INHALATION_SPRAY | Freq: Four times a day (QID) | RESPIRATORY_TRACT | Status: DC | PRN

## 2018-07-10 MED ORDER — PANTOPRAZOLE SODIUM 40 MG PO TBEC
40.0000 mg | DELAYED_RELEASE_TABLET | Freq: Every day | ORAL | Status: DC
  Administered 2018-07-10 – 2018-07-13 (×4): 40 mg via ORAL
  Filled 2018-07-10 (×4): qty 1

## 2018-07-10 MED ORDER — FERROUS SULFATE 325 (65 FE) MG PO TABS
325.0000 mg | ORAL_TABLET | Freq: Two times a day (BID) | ORAL | Status: DC
  Administered 2018-07-10 – 2018-07-13 (×7): 325 mg via ORAL
  Filled 2018-07-10 (×7): qty 1

## 2018-07-10 MED ORDER — VITAMIN B-12 1000 MCG PO TABS
1000.0000 ug | ORAL_TABLET | Freq: Every day | ORAL | Status: DC
  Administered 2018-07-10 – 2018-07-13 (×4): 1000 ug via ORAL
  Filled 2018-07-10 (×4): qty 1

## 2018-07-10 MED ORDER — ACETAMINOPHEN 650 MG RE SUPP: 650.0000 mg | RECTAL | Status: DC | PRN

## 2018-07-10 MED ORDER — APIXABAN 5 MG PO TABS: 5.0000 mg | ORAL_TABLET | Freq: Two times a day (BID) | ORAL | Status: DC

## 2018-07-10 MED ORDER — APIXABAN 2.5 MG PO TABS
2.5000 mg | ORAL_TABLET | Freq: Two times a day (BID) | ORAL | Status: DC
  Administered 2018-07-10 – 2018-07-13 (×7): 2.5 mg via ORAL
  Filled 2018-07-10 (×7): qty 1

## 2018-07-10 MED ORDER — ALBUTEROL SULFATE (2.5 MG/3ML) 0.083% IN NEBU: 2.5000 mg | INHALATION_SOLUTION | Freq: Four times a day (QID) | RESPIRATORY_TRACT | Status: DC | PRN

## 2018-07-10 MED ORDER — ATORVASTATIN CALCIUM 10 MG PO TABS
20.0000 mg | ORAL_TABLET | Freq: Every day | ORAL | Status: DC
  Administered 2018-07-10 – 2018-07-13 (×4): 20 mg via ORAL
  Filled 2018-07-10 (×4): qty 2

## 2018-07-10 MED ORDER — GABAPENTIN 300 MG PO CAPS
600.0000 mg | ORAL_CAPSULE | Freq: Every day | ORAL | Status: DC
  Administered 2018-07-10 – 2018-07-12 (×3): 600 mg via ORAL
  Filled 2018-07-10 (×3): qty 2

## 2018-07-10 MED ORDER — STROKE: EARLY STAGES OF RECOVERY BOOK
Freq: Once | Status: AC
  Administered 2018-07-10: 03:00:00

## 2018-07-10 MED ORDER — IOHEXOL 350 MG/ML SOLN
100.0000 mL | Freq: Once | INTRAVENOUS | Status: AC | PRN
  Administered 2018-07-10: 04:00:00 100 mL via INTRAVENOUS

## 2018-07-10 MED ORDER — SODIUM CHLORIDE 0.9 % IV SOLN
INTRAVENOUS | Status: AC
  Administered 2018-07-10 (×2): via INTRAVENOUS

## 2018-07-10 MED ORDER — GABAPENTIN 300 MG PO CAPS
300.0000 mg | ORAL_CAPSULE | Freq: Every day | ORAL | Status: DC
  Administered 2018-07-10 – 2018-07-13 (×4): 300 mg via ORAL
  Filled 2018-07-10 (×4): qty 1

## 2018-07-10 MED ORDER — LEVOTHYROXINE SODIUM 75 MCG PO TABS
75.0000 ug | ORAL_TABLET | Freq: Every day | ORAL | Status: DC
  Administered 2018-07-10 – 2018-07-13 (×4): 75 ug via ORAL
  Filled 2018-07-10 (×4): qty 1

## 2018-07-10 MED ORDER — ACETAMINOPHEN 325 MG PO TABS
650.0000 mg | ORAL_TABLET | ORAL | Status: DC | PRN
  Administered 2018-07-10 – 2018-07-13 (×11): 650 mg via ORAL
  Filled 2018-07-10 (×11): qty 2

## 2018-07-10 MED ORDER — ACETAMINOPHEN 160 MG/5ML PO SOLN: 650.0000 mg | ORAL | Status: DC | PRN

## 2018-07-10 MED ORDER — DOCUSATE SODIUM 100 MG PO CAPS
100.0000 mg | ORAL_CAPSULE | Freq: Every day | ORAL | Status: DC
  Administered 2018-07-10 – 2018-07-13 (×4): 100 mg via ORAL
  Filled 2018-07-10 (×4): qty 1

## 2018-07-10 NOTE — Discharge Instructions (Signed)

## 2018-07-10 NOTE — Evaluation (Signed)
Physical Therapy Evaluation Patient Details Name: Meredith Gonzalez MRN: [id number] DOB: July 21, 1935 Today's Date: 07/10/2018   History of Present Illness  83 y/o female admitted with dizziness, left hip painful since fall in April of this year. CT negative this admission, no MRI due to pacemaker. PMH significant for recent stroke, DM, HTN, pacemaker, CHF, cervical fracture, femur fracture, recurrent falls. S/p right CEA.   Clinical Impression  Pt admitted with above diagnosis. Pt currently with functional limitations due to the deficits listed below (see PT Problem List). At the time of PT eval pt reporting increased pain in L hip and low back. Pt had had several falls resulting in neck and back injury. Balance deficits effecting pt's safety with OOB mobility. Consistent min assist required for balance during gait training. Feel this patient could benefit from continued therapy at the SNF level, however if does not qualify for SNF due to OBS status, will need max home health services and 24 hour assistance. Pt will benefit from skilled PT to increase their independence and safety with mobility to allow discharge to the venue listed below.       Follow Up Recommendations SNF;Supervision/Assistance - 24 hour    Equipment Recommendations  None recommended by PT    Recommendations for Other Services       Precautions / Restrictions Precautions Precautions: Fall Precaution Comments: many falls with Sept neck fracture, April 2020 hurt left hip with indeterminate age of L-spine fracutures Restrictions Weight Bearing Restrictions: No      Mobility  Bed Mobility Overal bed mobility: Needs Assistance Bed Mobility: Supine to Sit     Supine to sit: Min guard;HOB elevated     General bed mobility comments: Pt sitting up in recliner upon PT arrival.   Transfers Overall transfer level: Needs assistance Equipment used: Rolling walker (2 wheeled) Transfers: Sit to/from Stand Sit to Stand: Min  assist         General transfer comment: increased time and effort due to hip pain; assist for power-up to full stand.   Ambulation/Gait Ambulation/Gait assistance: Min assist Gait Distance (Feet): 150 Feet Assistive device: Rolling walker (2 wheeled) Gait Pattern/deviations: Step-through pattern;Decreased stride length;Trunk flexed Gait velocity: Decreased Gait velocity interpretation: <1.8 ft/sec, indicate of risk for recurrent falls General Gait Details: Pt was able to ambulate with RW and occasional min assist for balance support. As pt fatigued, balance declined and more frequent min assist was required.   Stairs            Wheelchair Mobility    Modified Rankin (Stroke Patients Only)       Balance Overall balance assessment: Needs assistance Sitting-balance support: Feet supported;Single extremity supported   Sitting balance - Comments: Feels she needs at least one hand on bed for sitting due to hip pain   Standing balance support: Bilateral upper extremity supported Standing balance-Leahy Scale: Poor                               Pertinent Vitals/Pain Pain Assessment: Faces Pain Score: 6  Faces Pain Scale: Hurts even more Pain Location: Left hip and back 2 recent Lumbar compression fracture  Pain Descriptors / Indicators: Aching;Sore Pain Intervention(s): Monitored during session;Repositioned    Home Living Family/patient expects to be discharged to:: Private residence Living Arrangements: Alone Available Help at Discharge: Family;Available PRN/intermittently Type of Home: House Home Access: Level entry     Home Layout: One level Home Equipment:  Walker - 4 wheels;Walker - 2 wheels;Bedside commode;Shower seat - built in;Grab bars - tub/shower;Adaptive equipment Additional Comments: Lives in [facility]",     Prior Function Level of Independence: Independent with assistive device(s)         Comments: since recent  hospitalization rollator and RW in the home     Hand Dominance   Dominant Hand: Right    Extremity/Trunk Assessment   Upper Extremity Assessment Upper Extremity Assessment: Overall WFL for tasks assessed    Lower Extremity Assessment Lower Extremity Assessment: Generalized weakness       Communication   Communication: No difficulties  Cognition Arousal/Alertness: Awake/alert Behavior During Therapy: Flat affect Overall Cognitive Status: Within Functional Limits for tasks assessed                                        General Comments      Exercises     Assessment/Plan    PT Assessment Patient needs continued PT services  PT Problem List Decreased strength;Decreased activity tolerance;Decreased balance;Decreased mobility;Decreased knowledge of use of DME;Decreased safety awareness;Decreased knowledge of precautions;Pain       PT Treatment Interventions DME instruction;Gait training;Stair training;Functional mobility training;Therapeutic activities;Therapeutic exercise;Neuromuscular re-education;Patient/family education    PT Goals (Current goals can be found in the Care Plan section)  Acute Rehab PT Goals Patient Stated Goal: to not feel off balance when she is up on her feet PT Goal Formulation: With patient Time For Goal Achievement: 07/17/18 Potential to Achieve Goals: Good    Frequency Min 3X/week   Barriers to discharge        Co-evaluation               AM-PAC PT "6 Clicks" Mobility  Outcome Measure Help needed turning from your back to your side while in a flat bed without using bedrails?: None Help needed moving from lying on your back to sitting on the side of a flat bed without using bedrails?: A Little Help needed moving to and from a bed to a chair (including a wheelchair)?: A Little Help needed standing up from a chair using your arms (e.g., wheelchair or bedside chair)?: A Little Help needed to walk in hospital room?: A  Little Help needed climbing 3-5 steps with a railing? : A Lot 6 Click Score: 18    End of Session Equipment Utilized During Treatment: Gait belt Activity Tolerance: Patient limited by pain;Patient limited by fatigue Patient left: in chair;with call bell/phone within reach;with chair alarm set;with nursing/sitter in room Nurse Communication: Mobility status PT Visit Diagnosis: Unsteadiness on feet (R26.81);Pain Pain - Right/Left: Left Pain - part of body: Hip(and back)    Time: 9563-8756 PT Time Calculation (min) (ACUTE ONLY): 21 min   Charges:   PT Evaluation $PT Eval Moderate Complexity: 1 Mod          [name], PT, DPT Acute Rehabilitation Services Pager: [phone] Office: [phone]   [name] 07/10/2018, 12:57 PM

## 2018-07-10 NOTE — TOC Initial Note (Signed)
Transition of Care [facility]) - Initial/Assessment Note    Patient Details  Name: Meredith Gonzalez MRN: [id number] Date of Birth: January 24, 1936  [facility]) CM/SW Contact:    Pollie Friar, RN Phone Number: 07/10/2018, 1:48 PM  Clinical Narrative:                   Expected Discharge Plan: [place] Barriers to Discharge: Continued Medical Work up   Patient Goals and CMS Choice        Expected Discharge Plan and Services Expected Discharge Plan: [place] In-house Referral: Clinical Social Work     Living arrangements for the past 2 months: Single Family Home(in a retirment community)                          Prior Living Arrangements/Services Living arrangements for the past 2 months: Single Family Home(in a retirment community) Lives with:: Self Patient language and need for interpreter reviewed:: Yes(no needs) Do you feel safe going back to the place where you live?: Yes      Need for Family Participation in Patient Care: Yes (Comment) Care giver support system in place?: No (comment)(son checks on but not available 24/7) Current home services: DME(cane, 3 in 1, shower chair, rollator, walker) Criminal Activity/Legal Involvement Pertinent to Current Situation/Hospitalization: No - Comment as needed  Activities of Daily Living      Permission Sought/Granted Permission sought to share information with : Family Supports Permission granted to share information with : Yes, Verbal Permission Granted  Share Information with NAME: Yarixa Lightcap     Permission granted to share info w Relationship: son  Permission granted to share info w Contact Information: [phone]  Emotional Assessment Appearance:: Appears stated age Attitude/Demeanor/Rapport: Engaged Affect (typically observed): Accepting, Appropriate, Pleasant Orientation: : Oriented to Self, Oriented to Place, Oriented to Situation, Oriented to  Time   Psych Involvement:  No (comment)  Admission diagnosis:  Weakness [R53.1] Recent cerebrovascular accident (CVA) [Z86.73] Patient Active Problem List   Diagnosis Date Noted  . Dizziness 07/09/2018  . Stroke due to embolism of carotid artery ([place]) 06/05/2018  . Stroke-like symptom   . Dyspnea   . Acute blood loss anemia   . Acute ischemic stroke ([place]) 05/31/2018  . Stroke ([place]) 05/30/2018  . Chronic diastolic congestive heart failure ([place])   . Anemia of chronic disease   . Hypoalbuminemia due to protein-calorie malnutrition ([place])   . Labile blood pressure   . Atrial fibrillation ([place])   . Chronic neck pain   . Subcortical infarction ([place]) 04/27/2018  . CVA (cerebral vascular accident) ([place]) 04/26/2018  . TIA (transient ischemic attack) 04/24/2018  . CAD (coronary artery disease) 08/07/2016  . (HFpEF) heart failure with preserved ejection fraction ([place]) 08/07/2016  . Ischemic cardiomyopathy 08/07/2016  . Chest pain   . NSTEMI (non-ST elevated myocardial infarction) ([place])   . Status post coronary artery stent placement   . Abnormal stress test   . SOB (shortness of breath)   . Multifocal pneumonia 07/13/2016  . Elevated troponin 07/13/2016  . Leukocytosis   . Pacemaker 05/17/2014  . Complications, pacemaker cardiac, mechanical 12/31/2012  . Edema extremities 10/08/2012  . Persistent atrial fibrillation 10/08/2012  . Hypertension 10/08/2012  . Hypothyroidism 10/08/2012  . Neuropathy 10/08/2012  . Diabetes ([place]) 10/08/2012  . Depression 09/08/2012  . Anemia 08/08/2012  . Knee fracture, left 08/07/2012  . Recurrent falls 08/07/2012  . Syncope 08/07/2012  .  SDH (subdural hematoma) ([place]) 08/07/2012   PCP:  Sinclair Ship, MD Pharmacy:   [id number] [address], [place] [address] [place] [id number] Phone: [phone] Fax: [phone]     Social Determinants of Health (SDOH) Interventions    Readmission Risk Interventions No  flowsheet data found.

## 2018-07-10 NOTE — NC FL2 (Signed)
[place] LEVEL OF CARE SCREENING TOOL     IDENTIFICATION  Patient Name: Meredith Gonzalez Birthdate: 1935-08-07 Sex: female Admission Date (Current Location): 07/09/2018  [facility] and [place] Number:  Herbalist and Address:  [facility] [place], [place] [address], [place], [place] [address]      Provider Number: [id number]  Attending Physician Name and Address:  Bonnielee Haff, MD  Relative Name and Phone Number:  Talaya, [place], [phone]    Current Level of Care: Hospital Recommended Level of Care: [place] Prior Approval Number:    Date Approved/Denied:   PASRR Number: [id number]  Discharge Plan: SNF    Current Diagnoses: Patient Active Problem List   Diagnosis Date Noted  . Dizziness 07/09/2018  . 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 Stroke-like symptom   . Dyspnea   . Acute blood loss anemia   . Acute ischemic stroke ([place]) 05/31/2018  . Stroke ([place]) 05/30/2018  . Chronic diastolic congestive heart failure ([place])   . Anemia of chronic disease   . Hypoalbuminemia due to protein-calorie malnutrition ([place])   . Labile blood pressure   . Atrial fibrillation ([place])   . Chronic neck pain   . Subcortical infarction ([place]) 04/27/2018  . CVA (cerebral vascular accident) ([place]) 04/26/2018  . TIA (transient ischemic attack) 04/24/2018  . CAD (coronary artery disease) 08/07/2016  . (HFpEF) heart failure with preserved ejection fraction ([place]) 08/07/2016  . Ischemic cardiomyopathy 08/07/2016  . Chest pain   . NSTEMI (non-ST elevated myocardial infarction) ([place])   . Status post coronary artery stent placement   . Abnormal stress test   . SOB (shortness of breath)   . Multifocal pneumonia 07/13/2016  . Elevated troponin 07/13/2016  . Leukocytosis   . Pacemaker 05/17/2014  . Complications, pacemaker cardiac, mechanical 12/31/2012  . Edema extremities 10/08/2012  . Persistent atrial fibrillation  10/08/2012  . Hypertension 10/08/2012  . Hypothyroidism 10/08/2012  . Neuropathy 10/08/2012  . Diabetes ([place]) 10/08/2012  . Depression 09/08/2012  . Anemia 08/08/2012  . Knee fracture, left 08/07/2012  . Recurrent falls 08/07/2012  . Syncope 08/07/2012  . SDH (subdural hematoma) ([place]) 08/07/2012    Orientation RESPIRATION BLADDER Height & Weight     Self, Time, Situation, Place  Normal Continent Weight: 122 lb (55.3 kg) Height:  5\' 5"  (165.1 cm)  BEHAVIORAL SYMPTOMS/MOOD NEUROLOGICAL BOWEL NUTRITION STATUS      Continent Diet(heart healthy, thin liquids)  AMBULATORY STATUS COMMUNICATION OF NEEDS Skin   Limited Assist Verbally (Abrasion buttocks/back, blister on buttocks, skin tear on buttocks )                       Personal Care Assistance Level of Assistance  Bathing, Feeding, Dressing Bathing Assistance: Limited assistance Feeding assistance: Independent Dressing Assistance: Limited assistance     Functional Limitations Info  Sight, Hearing, Speech Sight Info: Impaired Hearing Info: Adequate Speech Info: Adequate    SPECIAL CARE FACTORS FREQUENCY  PT (By licensed PT), OT (By licensed OT), Speech therapy     PT Frequency: 5x/wk OT Frequency: 5x/wk     Speech Therapy Frequency: 2x/wk      Contractures Contractures Info: Not present    Additional Factors Info  Code Status, Allergies Code Status Info: DNR Allergies Info: Valium Diazepam, Tramadol, Codeine, Darifenacin, Darvon Propoxyphene Hcl, Daypro Oxaprozin, Enablex Darifenacin Hydrobromide Er, Oxycodone, Propoxyphene, Risperdal Risperidone, [place] Pentazocine, Vicodin Hydrocodone-acetaminophen  Current Medications (07/10/2018):  This is the current hospital active medication list Current Facility-Administered Medications  Medication Dose Route Frequency Provider Last Rate Last Dose  . 0.9 %  sodium chloride infusion   Intravenous Continuous Rise Patience, MD 75 mL/hr at 07/10/18  9562    . acetaminophen (TYLENOL) tablet 650 mg  650 mg Oral Q4H PRN Rise Patience, MD   650 mg at 07/10/18 1408   Or  . acetaminophen (TYLENOL) solution 650 mg  650 mg Per Tube Q4H PRN Rise Patience, MD       Or  . acetaminophen (TYLENOL) suppository 650 mg  650 mg Rectal Q4H PRN Rise Patience, MD      . albuterol (PROVENTIL) (2.5 MG/3ML) 0.083% nebulizer solution 2.5 mg  2.5 mg Nebulization Q6H PRN Rise Patience, MD      . amiodarone (PACERONE) tablet 200 mg  200 mg Oral Daily Rise Patience, MD   200 mg at 07/10/18 1308  . apixaban (ELIQUIS) tablet 2.5 mg  2.5 mg Oral BID Franky Macho, RPH   2.5 mg at 07/10/18 0848  . atorvastatin (LIPITOR) tablet 20 mg  20 mg Oral Daily Rise Patience, MD   20 mg at 07/10/18 6578  . docusate sodium (COLACE) capsule 100 mg  100 mg Oral Daily Rise Patience, MD   100 mg at 07/10/18 4696  . ferrous sulfate tablet 325 mg  325 mg Oral BID WC Rise Patience, MD   325 mg at 07/10/18 1641  . gabapentin (NEURONTIN) capsule 300 mg  300 mg Oral Daily Rise Patience, MD   300 mg at 07/10/18 2952  . gabapentin (NEURONTIN) capsule 600 mg  600 mg Oral QHS Rise Patience, MD      . levothyroxine (SYNTHROID) tablet 75 mcg  75 mcg Oral Q0600 Rise Patience, MD   75 mcg at 07/10/18 8413  . pantoprazole (PROTONIX) EC tablet 40 mg  40 mg Oral Daily Rise Patience, MD   40 mg at 07/10/18 2440  . vitamin B-12 (CYANOCOBALAMIN) tablet 1,000 mcg  1,000 mcg Oral Daily Rise Patience, MD   1,000 mcg at 07/10/18 1027     Discharge Medications: Please see discharge summary for a list of discharge medications.  Relevant Imaging Results:  Relevant Lab Results:   Additional Information SSN: [id number]  Philippa Chester Uzziel Russey, LCSWA

## 2018-07-10 NOTE — H&P (Addendum)
History and Physical    Meredith Gonzalez AST:[id number] DOB: 1935/04/15 DOA: 07/09/2018  PCP: Sinclair Ship, MD  Patient coming from: Home.  Chief Complaint: Dizziness.  HPI: Meredith Gonzalez is a 83 y.o. female with history of CAD status post stenting, atrial fibrillation, recent carotid endarterectomy and admission for stroke on apixaban recently started to experience dizziness since last afternoon 2 PM.  Patient states she started feeling dizzy on standing and was finding it difficult to walk.  Denies any fall or loss of consciousness.  Denies any chest pain shortness of breath nausea vomiting diarrhea fever chills or any recent contact with COVID-19.  Since symptoms persisted patient decided to come to the ER given the previous history of stroke with similar symptoms.  ED Course: In the ER patient was nonfocal and on trying to stand patient was not dizzy.  CT head was unremarkable EKG showed paced rhythm.  Lab works at baseline.  Patient admitted for further management of dizziness and also to rule out stroke.  Review of Systems: As per HPI, rest all negative.   Past Medical History:  Diagnosis Date  . Anemia   . Anxiety   . Arthritis    "all over" (02/11/2018)  . Basal cell carcinoma    "left leg" (02/11/2018)  . Cervical spine fracture ([place]) 12/2017   "C1-2"  . CHF (congestive heart failure) ([place])   . Chronic bronchitis ([place])   . Chronic neck pain    "since I broke my neck 6-8 wk ago" (02/11/2018)  . Diabetic peripheral neuropathy ([place])   . Fibromyalgia   . Fracture of right humerus   . Generalized weakness   . Headache    "weekly" (02/11/2018)  . History of blood transfusion    "related to one of my femur surgeries" (02/11/2018)  . History of echocardiogram    Echo 8/18: EF 50-55, no RWMA, Gr 1 DD, calcified AV leaflets, MAC, trivial MR, mod LAE, PASP 37  . Hypertension   . Hypothyroidism   . Incontinence of urine   . Ischemic stroke ([place]) 2000   "lost part of the vision  in my right eye" (02/11/2018)  . Major depression, chronic   . Myocardial infarction ([place]) 06/2016  . Pacemaker   . Recurrent falls   . Syncope and collapse   . Type 2 diabetes, diet controlled ([place])     Past Surgical History:  Procedure Laterality Date  . ABDOMINAL HYSTERECTOMY    . ANKLE FRACTURE SURGERY Right   . BASAL CELL CARCINOMA EXCISION Left    "leg"  . CARDIAC CATHETERIZATION  ~ 2014  . CARDIOVERSION N/A 02/13/2018   Procedure: CARDIOVERSION;  Surgeon: Sueanne Margarita, MD;  Location[facility] ENDOSCOPY;  Service: Cardiovascular;  Laterality: N/A;  . CARDIOVERSION N/A 03/27/2018   Procedure: CARDIOVERSION;  Surgeon: Lelon Perla, MD;  Location: [facility] ENDOSCOPY;  Service: Cardiovascular;  Laterality: N/A;  . CARPAL TUNNEL RELEASE     bilateral  . CATARACT EXTRACTION W/ INTRAOCULAR LENS  IMPLANT, BILATERAL Bilateral   . CHOLECYSTECTOMY OPEN    . COLONOSCOPY    . CORONARY ANGIOPLASTY WITH STENT PLACEMENT  06/2016  . CORONARY STENT INTERVENTION N/A 07/19/2016   Procedure: Coronary Stent Intervention;  Surgeon: Peter M Martinique, MD;  Location: [place] CV LAB;  Service: Cardiovascular;  Laterality: N/A;  . ENDARTERECTOMY Right 06/03/2018   Procedure: ENDARTERECTOMY CAROTID;  Surgeon: Rosetta Posner, MD;  Location: [place];  Service: Vascular;  Laterality: Right;  . EYE SURGERY  right eye catarace/lens implant  . FEMUR FRACTURE SURGERY Bilateral   . FRACTURE SURGERY    . GASTRIC BYPASS    . INSERT / REPLACE / REMOVE PACEMAKER  ~ 2012  . LEAD REVISION N/A 01/01/2013   Procedure: LEAD REVISION;  Surgeon: Evans Lance, MD;  Location[facility] C[facility] LAB;  Service: Cardiovascular;  Laterality: N/A;  . LEFT HEART CATH AND CORONARY ANGIOGRAPHY N/A 07/19/2016   Procedure: Left Heart Cath and Coronary Angiography;  Surgeon: Peter M Martinique, MD;  Location: [place] CV LAB;  Service: Cardiovascular;  Laterality: N/A;  . OVARIAN CYST SURGERY     "one side only"  . PATCH ANGIOPLASTY Right  06/03/2018   Procedure: PATCH ANGIOPLASTY USING HEMASHIELD PLATINUM FINESSE;  Surgeon: Rosetta Posner, MD;  Location: [place];  Service: Vascular;  Laterality: Right;  . TEE WITHOUT CARDIOVERSION N/A 02/13/2018   Procedure: TRANSESOPHAGEAL ECHOCARDIOGRAM (TEE);  Surgeon: Sueanne Margarita, MD;  Location: [facility] ENDOSCOPY;  Service: Cardiovascular;  Laterality: N/A;  . TONSILLECTOMY       reports that she quit smoking about 45 years ago. Her smoking use included cigarettes. She has a 2.00 pack-year smoking history. She has never used smokeless tobacco. She reports current alcohol use. She reports that she does not use drugs.  Allergies  Allergen Reactions  . Valium [Diazepam] Anxiety    Makes patient hyper  . Tramadol     sedation  . Codeine Hives and Itching    Can take with Benadryl   . Darifenacin Itching    Can take with Benadryl  . Darvon [Propoxyphene Hcl] Itching    Can take with Benadryl  . Daypro [Oxaprozin] Itching    Can take with Benadryl  . Enablex [Darifenacin Hydrobromide Er] Itching    Can take with Benadryl  . Oxycodone Itching    Can take with Benadryl   . Propoxyphene Itching    Can take with Benadryl  . Risperdal [Risperidone] Itching    Can take with Benadryl  . Talwin [Pentazocine] Itching    Can take with Benadryl  . Vicodin [Hydrocodone-Acetaminophen] Itching    Can take with Benadryl     Family History  Problem Relation Age of Onset  . Congestive Heart Failure Mother   . Heart attack Father   . Alcoholism Father   . Alcoholism Sister   . Alcoholism Brother   . Alcoholism Brother   . Throat cancer Brother     Prior to Admission medications   Medication Sig Start Date End Date Taking? Authorizing Provider  acetaminophen (TYLENOL) 500 MG tablet Take 500 mg by mouth every 6 (six) hours as needed for moderate pain.     [provider]  albuterol (PROVENTIL HFA;VENTOLIN HFA) 108 (90 Base) MCG/ACT inhaler Inhale 2 puffs into the lungs every 6  (six) hours as needed for wheezing or shortness of breath. 05/07/18   Angiulli, Lavon Paganini, PA-C  amiodarone (PACERONE) 200 MG tablet Take 1 tablet (200 mg total) by mouth every morning. Patient taking differently: Take 200 mg by mouth daily.  05/07/18   Angiulli, Lavon Paganini, PA-C  apixaban (ELIQUIS) 5 MG TABS tablet Take 1 tablet (5 mg total) by mouth 2 (two) times daily. 06/05/18   Dessa Phi, DO  atorvastatin (LIPITOR) 20 MG tablet Take 1 tablet (20 mg total) by mouth daily for 30 days. 06/05/18 07/05/18  Dessa Phi, DO  budesonide-formoterol (SYMBICORT) 160-4.5 MCG/ACT inhaler Inhale 2 puffs into the lungs 2 (two) times daily. 05/20/18  Mannam, Praveen, MD  docusate sodium (COLACE) 100 MG capsule Take 1 capsule (100 mg total) by mouth daily. 06/24/18   Love, Ivan Anchors, PA-C  ferrous sulfate 325 (65 FE) MG tablet Take 1 tablet (325 mg total) by mouth 2 (two) times daily with a meal. 05/07/18   Angiulli, Lavon Paganini, PA-C  flintstones complete (FLINTSTONES) 60 MG chewable tablet Chew 1 tablet by mouth daily.    [provider]  gabapentin (NEURONTIN) 300 MG capsule Take 1-2 capsules (300-600 mg total) by mouth See admin instructions. Take 1 capsule (300 mg) by mouth in the mornning & take 2 capsules (600 mg) by mouth at night Patient taking differently: Take 300-600 mg by mouth See admin instructions. Take 1 capsule (300 mg) by mouth in the morning & take 2 capsules (600 mg) by mouth at night 05/07/18   Angiulli, Lavon Paganini, PA-C  levothyroxine (SYNTHROID, LEVOTHROID) 75 MCG tablet Take 1 tablet (75 mcg total) by mouth daily at 6 (six) AM. 06/24/18   Love, Ivan Anchors, PA-C  nitroGLYCERIN (NITROSTAT) 0.4 MG SL tablet Place 1 tablet (0.4 mg total) under the tongue every 5 (five) minutes as needed for chest pain. Patient not taking: Reported on 05/30/2018 10/11/16 03/17/18  Fay Records, MD  pantoprazole (PROTONIX) 40 MG tablet Take 1 tablet (40 mg total) by mouth daily. 06/24/18   Love, Ivan Anchors, PA-C   sertraline (ZOLOFT) 50 MG tablet Take 1 tablet (50 mg total) by mouth daily. 06/24/18   Love, Ivan Anchors, PA-C  vitamin B-12 (CYANOCOBALAMIN) 1000 MCG tablet Take 1 tablet (1,000 mcg total) by mouth daily. 07/20/16   Thurnell Lose, MD    Physical Exam: Vitals:   07/09/18 2059 07/09/18 2330 07/10/18 0000 07/10/18 0200  BP:  (!) 188/87 (!) 168/77 (!) 162/64  Pulse:  73 76 67  Resp:  _0 Temp:    97.8 F (36.6 C)  TempSrc:    Oral  SpO2:  96% 96% 100%  Weight: 55.3 kg     Height: _1  (1.651 m)         Constitutional: Moderately built and nourished. Vitals:   07/09/18 2059 07/09/18 2330 07/10/18 0000 07/10/18 0200  BP:  (!) 188/87 (!) 168/77 (!) 162/64  Pulse:  73 76 67  Resp:  _2 Temp:    97.8 F (36.6 C)  TempSrc:    Oral  SpO2:  96% 96% 100%  Weight: 55.3 kg     Height: _3  (1.651 m)      Eyes: Anicteric no pallor. ENMT: No discharge from the ears eyes nose and mouth. Neck: No mass felt.  No neck rigidity. Respiratory: No rhonchi or crepitations. Cardiovascular: S1-S2 heard. Abdomen: Soft nontender bowel sounds present. Musculoskeletal: No edema. Skin: No rash. Neurologic: Alert awake oriented to time place and person.  Moves all extremities 5 x 5.  No facial admitted tongue is midline pupils equal reactive light. Psychiatric: Appears normal per normal affect.   Labs on Admission: I have personally reviewed following labs and imaging studies  CBC: Recent Labs  Lab 07/09/18 2129  WBC 8.2  NEUTROABS 6.5  HGB 12.1  HCT 39.4  MCV 106.5*  PLT 622   Basic Metabolic Panel: Recent Labs  Lab 07/09/18 2129  NA 136  K 4.1  CL 110  CO2 20*  GLUCOSE 115*  BUN 22  CREATININE 0.69  CALCIUM 8.5*   GFR: Estimated Creatinine Clearance: 49 mL/min (by C-G formula  based on SCr of 0.69 mg/dL). Liver Function Tests: No results for input(s): AST, ALT, ALKPHOS, BILITOT, PROT, ALBUMIN in the last 168 hours. No results for input(s): LIPASE, AMYLASE in  the last 168 hours. No results for input(s): AMMONIA in the last 168 hours. Coagulation Profile: No results for input(s): INR, PROTIME in the last 168 hours. Cardiac Enzymes: Recent Labs  Lab 07/09/18 2129  TROPONINI <0.03   BNP (last 3 results) No results for input(s): PROBNP in the last 8760 hours. HbA1C: No results for input(s): HGBA1C in the last 72 hours. CBG: No results for input(s): GLUCAP in the last 168 hours. Lipid Profile: No results for input(s): CHOL, HDL, LDLCALC, TRIG, CHOLHDL, LDLDIRECT in the last 72 hours. Thyroid Function Tests: No results for input(s): TSH, T4TOTAL, FREET4, T3FREE, THYROIDAB in the last 72 hours. Anemia Panel: No results for input(s): VITAMINB12, FOLATE, FERRITIN, TIBC, IRON, RETICCTPCT in the last 72 hours. Urine analysis:    Component Value Date/Time   COLORURINE YELLOW 07/09/2018 2234   APPEARANCEUR CLEAR 07/09/2018 2234   LABSPEC 1.020 07/09/2018 2234   PHURINE 6.5 07/09/2018 [place] 07/09/2018 [place] 07/09/2018 [place] 07/09/2018 [place] 07/09/2018 2234   PROTEINUR NEGATIVE 07/09/2018 2234   NITRITE NEGATIVE 07/09/2018 2234   LEUKOCYTESUR TRACE (A) 07/09/2018 2234   Sepsis Labs: _0 (procalcitonin:4,lacticidven:4) )No results found for this or any previous visit (from the past 240 hour(s)).   Radiological Exams on Admission: Dg Chest 2 View  Result Date: 07/09/2018 CLINICAL DATA:  Fatigue and weakness EXAM: CHEST - 2 VIEW COMPARISON:  04/02/2018 FINDINGS: Cardiac shadow is mildly enlarged. Pacing device is again seen and stable. The lungs are well aerated bilaterally. Chronic interstitial changes are noted bilaterally without focal infiltrate or effusion. No acute bony abnormality is noted. IMPRESSION: Chronic changes without acute abnormality. Electronically Signed   By: Inez Catalina M.D.   On: 07/09/2018 22:43   Ct Head Wo Contrast  Result Date:  07/10/2018 CLINICAL DATA:  83 y/o F; fatigue, weakness, loss of balance. History of stroke. EXAM: CT HEAD WITHOUT CONTRAST TECHNIQUE: Contiguous axial images were obtained from the base of the skull through the vertex without intravenous contrast. COMPARISON:  06/27/2018 CT head FINDINGS: Brain: No evidence of acute infarction, hemorrhage, hydrocephalus, extra-axial collection or mass lesion/mass effect. Stable nonspecific white matter hypodensities compatible with chronic microvascular ischemic changes. Stable volume loss of the brain. Vascular: Calcific atherosclerosis of the carotid siphons. No hyperdense vessel identified. Skull: Chronic C1 arch fractures, partially visualized. No calvarial fracture. Sinuses/Orbits: No acute finding. Other: Bilateral intra-ocular lens replacement. IMPRESSION: 1. No acute intracranial abnormality identified. 2. Stable chronic microvascular ischemic changes and volume loss of the brain. Electronically Signed   By: Kristine Garbe M.D.   On: 07/10/2018 00:00    EKG: Independently reviewed.  Paced rhythm.  Assessment/Plan Principal Problem:   Dizziness Active Problems:   Persistent atrial fibrillation   (HFpEF) heart failure with preserved ejection fraction (HCC)   Stroke due to embolism of carotid artery ([place])    1. Dizziness mostly on standing and trying to ambulate -reviewing patient's chart patient has had previous history of autonomic dysfunction.  Will check orthostatic changes and also check cortisol levels.  For now gently hydrate.  Given the recent history of multiple strokes CVA is in the differential.  Discussed with Dr. Cheral Marker on-call neurologist will be seeing patient in consult and Dr. Cheral Marker is requested to get a  CT angiogram of the head and neck which I have ordered.  We will get physical therapy consult check hemoglobin A1c lipid panel.  Has had recent 2D echo last month.  Patient is on statins apixaban. 2. Elevated blood pressure -patient  is not on any antihypertensives.  Will follow blood pressure trends and also check orthostatic before starting any antihypertensives. 3. A. fib and a history of sinus node dysfunction status post pacemaker placement -on amiodarone and apixaban. 4. CAD denies any chest pain. 5. Hypothyroidism on Synthroid. 6. Recent carotid endarterectomy after admission for stroke in March 2020. 7. History of diastolic CHF appears euvolemic.   DVT prophylaxis: Apixaban. Code Status: DNR. Family Communication: Discussed with patient. Disposition Plan: Home. Consults called: Neurology. Admission status: Observation.   Rise Patience MD [facility] Pager [phone].  If 7PM-7AM, please contact night-coverage [email or web address] Password [facility]  07/10/2018, 2:48 AM

## 2018-07-10 NOTE — ED Notes (Signed)
Report given to [facility] with [facility]

## 2018-07-10 NOTE — Progress Notes (Addendum)
Patient admitted earlier this morning.  Patient seen and examined.  H&P reviewed.  Patient was hospitalized in March for possible right brain stroke.  She was found to have carotid artery stenosis.  She underwent right carotid endarterectomy in March.  She subsequently was discharged to inpatient rehab.  And went back home earlier this month.  She lives by herself.  Uses a walker.  Has had at least 2 episodes of falls at home.  During her previous hospitalization her dose of apixaban was increased.  Patient presented with episodes of dizziness.  Some of these episodes were after she would get up from a sitting or lying position but other episodes will suddenly occur even without change in position.  Patient denies any recent illness.  No nausea vomiting or diarrhea.  Has been eating and drinking normally.  No concern for dehydration based on history.  Vital signs are stable this morning.  Orthostatic vital signs were checked and her systolic blood pressure did decrease by 20 mmHg. there was no significant change in diastolic blood pressure nor any change in her heart rate.  She tells me that she did not become symptomatic when she was stood up.  Lungs are clear to auscultation.  S1-S2 is regular.  Abdomen is soft nontender nondistended.  Telemetry shows paced rhythm.  Patient admitted to the hospital due to concern for posterior stroke.  Seen by neurology.  She did have a drop in her blood pressure when she was moved from a lying to a standing position.  However at least per history all of her symptoms are not consistent with orthostatic hypotension.  Will wait on PT and OT evaluation.  Neurology has recommended repeating CT scan in 72 hours.  Echocardiogram was also recommended however patient had echocardiogram in January and again in March.  Patient lives by herself.  She is on anticoagulation.  Continue to monitor closely.  Bonnielee Haff 07/10/2018

## 2018-07-10 NOTE — Consult Note (Signed)
Referring Physician: Dr. Hal Hope    Chief Complaint: Weakness and loss of balance  HPI: Meredith Gonzalez is an 83 y.o. female with recent stroke in March, CHF, HTN and pacemaker who presented to the ED with complaints of fatigue, increased weakness and loss of balance for "the last few hours". Also endorsed HTN. Per notes her SBP had been 199 at some point. She felt dizzy and unsteady on her feet as though she was going to fall. She did not endorse headache, focal weakness or visual changes at the time of presentation. Family stated that the patient had two strokes in the recent past, including the one in March. She was observed to be very unsteady by ED staff on attempts to ambulate. CT head revealed no acute changes.   She is on apixaban and atorvastatin at home.    CT head: 1. No acute intracranial abnormality identified. 2. Stable chronic microvascular ischemic changes and volume loss of the brain.  CTA head and neck: 1. Right carotid endarterectomy is patent. 2. No large vessel occlusion. 3. Mild diffuse distal small vessel disease. 4. Atherosclerotic changes at the left carotid bifurcation without a significant stenosis. 5. Atherosclerotic changes at the aortic arch and cavernous internal carotid arteries bilaterally without a significant stenosis. 6. Chronic type 2 dens fracture. 7. Degenerative changes of the cervical spine are stable.   Past Medical History:  Diagnosis Date  . Anemia   . Anxiety   . Arthritis    "all over" (02/11/2018)  . Basal cell carcinoma    "left leg" (02/11/2018)  . Cervical spine fracture ([place]) 12/2017   "C1-2"  . CHF (congestive heart failure) ([place])   . Chronic bronchitis ([place])   . Chronic neck pain    "since I broke my neck 6-8 wk ago" (02/11/2018)  . Diabetic peripheral neuropathy ([place])   . Fibromyalgia   . Fracture of right humerus   . Generalized weakness   . Headache    "weekly" (02/11/2018)  . History of blood transfusion     "related to one of my femur surgeries" (02/11/2018)  . History of echocardiogram    Echo 8/18: EF 50-55, no RWMA, Gr 1 DD, calcified AV leaflets, MAC, trivial MR, mod LAE, PASP 37  . Hypertension   . Hypothyroidism   . Incontinence of urine   . Ischemic stroke ([place]) 2000   "lost part of the vision in my right eye" (02/11/2018)  . Major depression, chronic   . Myocardial infarction ([place]) 06/2016  . Pacemaker   . Recurrent falls   . Syncope and collapse   . Type 2 diabetes, diet controlled ([place])     Past Surgical History:  Procedure Laterality Date  . ABDOMINAL HYSTERECTOMY    . ANKLE FRACTURE SURGERY Right   . BASAL CELL CARCINOMA EXCISION Left    "leg"  . CARDIAC CATHETERIZATION  ~ 2014  . CARDIOVERSION N/A 02/13/2018   Procedure: CARDIOVERSION;  Surgeon: Sueanne Margarita, MD;  Location: [facility] ENDOSCOPY;  Service: Cardiovascular;  Laterality: N/A;  . CARDIOVERSION N/A 03/27/2018   Procedure: CARDIOVERSION;  Surgeon: Lelon Perla, MD;  Location: [facility] ENDOSCOPY;  Service: Cardiovascular;  Laterality: N/A;  . CARPAL TUNNEL RELEASE     bilateral  . CATARACT EXTRACTION W/ INTRAOCULAR LENS  IMPLANT, BILATERAL Bilateral   . CHOLECYSTECTOMY OPEN    . COLONOSCOPY    . CORONARY ANGIOPLASTY WITH STENT PLACEMENT  06/2016  . CORONARY STENT INTERVENTION N/A 07/19/2016   Procedure: Coronary Stent Intervention;  Surgeon: Peter M Martinique, MD;  Location: [place] CV LAB;  Service: Cardiovascular;  Laterality: N/A;  . ENDARTERECTOMY Right 06/03/2018   Procedure: ENDARTERECTOMY CAROTID;  Surgeon: Rosetta Posner, MD;  Location: [place];  Service: Vascular;  Laterality: Right;  . EYE SURGERY     right eye catarace/lens implant  . FEMUR FRACTURE SURGERY Bilateral   . FRACTURE SURGERY    . GASTRIC BYPASS    . INSERT / REPLACE / REMOVE PACEMAKER  ~ 2012  . LEAD REVISION N/A 01/01/2013   Procedure: LEAD REVISION;  Surgeon: Evans Lance, MD;  Location: [facility] CATH LAB;  Service: Cardiovascular;  Laterality:  N/A;  . LEFT HEART CATH AND CORONARY ANGIOGRAPHY N/A 07/19/2016   Procedure: Left Heart Cath and Coronary Angiography;  Surgeon: Peter M Martinique, MD;  Location: [place] CV LAB;  Service: Cardiovascular;  Laterality: N/A;  . OVARIAN CYST SURGERY     "one side only"  . PATCH ANGIOPLASTY Right 06/03/2018   Procedure: PATCH ANGIOPLASTY USING HEMASHIELD PLATINUM FINESSE;  Surgeon: Rosetta Posner, MD;  Location: [place];  Service: Vascular;  Laterality: Right;  . TEE WITHOUT CARDIOVERSION N/A 02/13/2018   Procedure: TRANSESOPHAGEAL ECHOCARDIOGRAM (TEE);  Surgeon: Sueanne Margarita, MD;  Location: [facility] ENDOSCOPY;  Service: Cardiovascular;  Laterality: N/A;  . TONSILLECTOMY      Family History  Problem Relation Age of Onset  . Congestive Heart Failure Mother   . Heart attack Father   . Alcoholism Father   . Alcoholism Sister   . Alcoholism Brother   . Alcoholism Brother   . Throat cancer Brother    Social History:  reports that she quit smoking about 45 years ago. Her smoking use included cigarettes. She has a 2.00 pack-year smoking history. She has never used smokeless tobacco. She reports current alcohol use. She reports that she does not use drugs.  Allergies:  Allergies  Allergen Reactions  . Valium [Diazepam] Anxiety    Makes patient hyper  . Tramadol     sedation  . Codeine Hives and Itching    Can take with Benadryl   . Darifenacin Itching    Can take with Benadryl  . Darvon [Propoxyphene Hcl] Itching    Can take with Benadryl  . Daypro [Oxaprozin] Itching    Can take with Benadryl  . Enablex [Darifenacin Hydrobromide Er] Itching    Can take with Benadryl  . Oxycodone Itching    Can take with Benadryl   . Propoxyphene Itching    Can take with Benadryl  . Risperdal [Risperidone] Itching    Can take with Benadryl  . Talwin [Pentazocine] Itching    Can take with Benadryl  . Vicodin [Hydrocodone-Acetaminophen] Itching    Can take with Benadryl     Medications:  Prior to  Admission:  Medications Prior to Admission  Medication Sig Dispense Refill Last Dose  . acetaminophen (TYLENOL) 500 MG tablet Take 500 mg by mouth every 6 (six) hours as needed for moderate pain.    Taking  . albuterol (PROVENTIL HFA;VENTOLIN HFA) 108 (90 Base) MCG/ACT inhaler Inhale 2 puffs into the lungs every 6 (six) hours as needed for wheezing or shortness of breath. 1 Inhaler 1 Taking  . amiodarone (PACERONE) 200 MG tablet Take 1 tablet (200 mg total) by mouth every morning. (Patient taking differently: Take 200 mg by mouth daily. ) 90 tablet 3 Taking  . apixaban (ELIQUIS) 5 MG TABS tablet Take 1 tablet (5 mg total) by mouth 2 (  two) times daily. 60 tablet 0 Taking  . atorvastatin (LIPITOR) 20 MG tablet Take 1 tablet (20 mg total) by mouth daily for 30 days. 30 tablet 0 Taking  . budesonide-formoterol (SYMBICORT) 160-4.5 MCG/ACT inhaler Inhale 2 puffs into the lungs 2 (two) times daily. 1 Inhaler 5 Taking  . docusate sodium (COLACE) 100 MG capsule Take 1 capsule (100 mg total) by mouth daily. 30 capsule 0 Taking  . ferrous sulfate 325 (65 FE) MG tablet Take 1 tablet (325 mg total) by mouth 2 (two) times daily with a meal. 60 tablet 0 Taking  . flintstones complete (FLINTSTONES) 60 MG chewable tablet Chew 1 tablet by mouth daily.   Taking  . gabapentin (NEURONTIN) 300 MG capsule Take 1-2 capsules (300-600 mg total) by mouth See admin instructions. Take 1 capsule (300 mg) by mouth in the mornning & take 2 capsules (600 mg) by mouth at night (Patient taking differently: Take 300-600 mg by mouth See admin instructions. Take 1 capsule (300 mg) by mouth in the morning & take 2 capsules (600 mg) by mouth at night) 90 capsule 0 Taking  . levothyroxine (SYNTHROID, LEVOTHROID) 75 MCG tablet Take 1 tablet (75 mcg total) by mouth daily at 6 (six) AM. 30 tablet 1 Taking  . nitroGLYCERIN (NITROSTAT) 0.4 MG SL tablet Place 1 tablet (0.4 mg total) under the tongue every 5 (five) minutes as needed for chest pain.  (Patient not taking: Reported on 05/30/2018) 25 tablet 2 Not Taking at Unknown time  . pantoprazole (PROTONIX) 40 MG tablet Take 1 tablet (40 mg total) by mouth daily. 30 tablet 1 Taking  . sertraline (ZOLOFT) 50 MG tablet Take 1 tablet (50 mg total) by mouth daily. 30 tablet 1 Taking  . vitamin B-12 (CYANOCOBALAMIN) 1000 MCG tablet Take 1 tablet (1,000 mcg total) by mouth daily. 30 tablet 0 Taking   Scheduled: . amiodarone  200 mg Oral Daily  . apixaban  2.5 mg Oral BID  . atorvastatin  20 mg Oral Daily  . docusate sodium  100 mg Oral Daily  . ferrous sulfate  325 mg Oral BID WC  . gabapentin  300 mg Oral Daily  . gabapentin  600 mg Oral QHS  . levothyroxine  75 mcg Oral Q0600  . pantoprazole  40 mg Oral Daily  . vitamin B-12  1,000 mcg Oral Daily   Continuous: . sodium chloride 75 mL/hr at 07/10/18 0622    ROS: No fever, SOB or cough. Other ROS as per HPI.      Physical Examination: Blood pressure (!) 169/74, pulse 68, temperature 98 F (36.7 C), temperature source Oral, resp. rate 20, height _0  (1.651 m), weight 55.3 kg, SpO2 98 %.  HEENT: [place]/AT. Decreased hydration of oral mucosa Lungs: Respirations unlabored Ext: Mild pitting edema distal lower extremities with chronic trophic changes.   Neurologic Examination: Mental Status: Alert, oriented, thought content appropriate.  Speech fluent without evidence of aphasia.  Able to follow all commands without difficulty. Cranial Nerves: II:  Visual fields intact. PERRL.  III,IV, VI: EOMI with mild saccadic quality of visual pursuits noted.  V,VII: No facial droop. Temp sensation equal bilaterally VIII: hearing intact to voice IX,X: No hypophonia XI: Symmetric XII: midline tongue extension  Motor: Right : Upper extremity   5/5    Left:     Upper extremity   5/5  Lower extremity   5/5     Lower extremity   5/5 No pronator drift Sensory: Temp and light touch  intact x 4 without extinction Deep Tendon Reflexes:  3+ bilateral  brachioradialis and biceps.  Trace patellar reflexes bilaterally Plantars: Right: downgoing   Left: downgoing Cerebellar: No ataxia with FNF bilaterally  Gait: Deferred  Results for orders placed or performed during the hospital encounter of 07/09/18 (from the past 48 hour(s))  Basic metabolic panel     Status: Abnormal   Collection Time: 07/09/18  9:29 PM  Result Value Ref Range   Sodium 136 135 - 145 mmol/L   Potassium 4.1 3.5 - 5.1 mmol/L   Chloride 110 98 - 111 mmol/L   CO2 20 (L) 22 - 32 mmol/L   Glucose, Bld 115 (H) 70 - 99 mg/dL   BUN 22 8 - 23 mg/dL   Creatinine, Ser 0.69 0.44 - 1.00 mg/dL   Calcium 8.5 (L) 8.9 - 10.3 mg/dL   GFR calc non Af Amer >60 >60 mL/min   GFR calc Af Amer >60 >60 mL/min   Anion gap 6 5 - 15    Comment: Performed at [facility], [address] [place], [place] [address]  CBC with Differential     Status: Abnormal   Collection Time: 07/09/18  9:29 PM  Result Value Ref Range   WBC 8.2 4.0 - 10.5 K/uL   RBC 3.70 (L) 3.87 - 5.11 MIL/uL   Hemoglobin 12.1 12.0 - 15.0 g/dL   HCT 39.4 36.0 - 46.0 %   MCV 106.5 (H) 80.0 - 100.0 fL   MCH 32.7 26.0 - 34.0 pg   MCHC 30.7 30.0 - 36.0 g/dL   RDW 16.4 (H) 11.5 - 15.5 %   Platelets 324 150 - 400 K/uL   nRBC 0.0 0.0 - 0.2 %   Neutrophils Relative % 79 %   Neutro Abs 6.5 1.7 - 7.7 K/uL   Lymphocytes Relative 12 %   Lymphs Abs 1.0 0.7 - 4.0 K/uL   Monocytes Relative 7 %   Monocytes Absolute 0.6 0.1 - 1.0 K/uL   Eosinophils Relative 1 %   Eosinophils Absolute 0.0 0.0 - 0.5 K/uL   Basophils Relative 0 %   Basophils Absolute 0.0 0.0 - 0.1 K/uL   Immature Granulocytes 1 %   Abs Immature Granulocytes 0.07 0.00 - 0.07 K/uL    Comment: Performed at [facility], [address] [place], [place] [address]  Troponin I - Once     Status: None   Collection Time: 07/09/18  9:29 PM  Result Value Ref Range   Troponin I <0.03 <0.03 ng/mL    Comment: Performed at [facility], [address] [place], [place] [address]  Brain natriuretic peptide     Status: Abnormal   Collection Time: 07/09/18  9:29 PM  Result Value Ref Range   B Natriuretic Peptide 135.1 (H) 0.0 - 100.0 pg/mL    Comment: Performed at [facility], [address] [place], [place] [address]  Urinalysis, Routine w reflex microscopic     Status: Abnormal   Collection Time: 07/09/18 10:34 PM  Result Value Ref Range   Color, Urine YELLOW YELLOW   APPearance CLEAR CLEAR   Specific Gravity, Urine 1.020 1.005 - 1.030   pH 6.5 5.0 - 8.0   Glucose, UA NEGATIVE NEGATIVE mg/dL   Hgb urine dipstick NEGATIVE NEGATIVE   Bilirubin Urine NEGATIVE NEGATIVE   Ketones, ur NEGATIVE NEGATIVE mg/dL   Protein, ur NEGATIVE NEGATIVE mg/dL   Nitrite NEGATIVE NEGATIVE  Leukocytes,Ua TRACE (A) NEGATIVE    Comment: Performed at [facility], [address] [place], [place] [address]  Urinalysis, Microscopic (reflex)     Status: Abnormal   Collection Time: 07/09/18 10:34 PM  Result Value Ref Range   RBC / HPF NONE SEEN 0 - 5 RBC/hpf   WBC, UA 0-5 0 - 5 WBC/hpf   Bacteria, UA RARE (A) NONE SEEN   Squamous Epithelial / LPF 0-5 0 - 5    Comment: Performed at [facility], [address] [place], [place] [address]  Hemoglobin A1c     Status: None   Collection Time: 07/10/18  3:24 AM  Result Value Ref Range   Hgb A1c MFr Bld 5.1 4.8 - 5.6 %    Comment: (NOTE) Pre diabetes:          5.7%-6.4% Diabetes:              >6.4% Glycemic control for   <7.0% adults with diabetes    Mean Plasma Glucose 99.67 mg/dL    Comment: Performed at [facility] [address], [place], [place] [address]  Lipid panel     Status: None   Collection Time: 07/10/18  3:24 AM  Result Value Ref Range   Cholesterol 88 0 - 200 mg/dL   Triglycerides 26 <150 mg/dL   HDL 52 >40 mg/dL   Total CHOL/HDL Ratio 1.7 RATIO   VLDL 5 0 - 40 mg/dL   LDL Cholesterol 31 0 - 99 mg/dL    Comment:        Total  Cholesterol/HDL:CHD Risk Coronary Heart Disease Risk Table                     Men   Women  1/2 Average Risk   3.4   3.3  Average Risk       5.0   4.4  2 X Average Risk   9.6   7.1  3 X Average Risk  23.4   11.0        Use the calculated Patient Ratio above and the CHD Risk Table to determine the patient's CHD Risk.        ATP III CLASSIFICATION (LDL):  <100     mg/dL   Optimal  100-129  mg/dL   Near or Above                    Optimal  130-159  mg/dL   Borderline  160-189  mg/dL   High  >190     mg/dL   Very High Performed at [facility] [address], [place], [place] [address]   Comprehensive metabolic panel     Status: Abnormal   Collection Time: 07/10/18  3:24 AM  Result Value Ref Range   Sodium 138 135 - 145 mmol/L   Potassium 3.9 3.5 - 5.1 mmol/L   Chloride 111 98 - 111 mmol/L   CO2 21 (L) 22 - 32 mmol/L   Glucose, Bld 110 (H) 70 - 99 mg/dL   BUN 16 8 - 23 mg/dL   Creatinine, Ser 0.64 0.44 - 1.00 mg/dL   Calcium 8.2 (L) 8.9 - 10.3 mg/dL   Total Protein 6.2 (L) 6.5 - 8.1 g/dL   Albumin 2.7 (L) 3.5 - 5.0 g/dL   AST 24 15 - 41 U/L   ALT 21 0 - 44 U/L   Alkaline Phosphatase 152 (H) 38 -  126 U/L   Total Bilirubin 0.3 0.3 - 1.2 mg/dL   GFR calc non Af Amer >60 >60 mL/min   GFR calc Af Amer >60 >60 mL/min   Anion gap 6 5 - 15    Comment: Performed at [facility] [address] [place], [place] [address]  CBC     Status: Abnormal   Collection Time: 07/10/18  3:24 AM  Result Value Ref Range   WBC 8.5 4.0 - 10.5 K/uL   RBC 3.37 (L) 3.87 - 5.11 MIL/uL   Hemoglobin 10.7 (L) 12.0 - 15.0 g/dL   HCT 35.5 (L) 36.0 - 46.0 %   MCV 105.3 (H) 80.0 - 100.0 fL   MCH 31.8 26.0 - 34.0 pg   MCHC 30.1 30.0 - 36.0 g/dL   RDW 16.0 (H) 11.5 - 15.5 %   Platelets 295 150 - 400 K/uL   nRBC 0.0 0.0 - 0.2 %    Comment: Performed at [facility], [place] [address], [place], [place] [address]  Lactic acid, plasma     Status: None   Collection Time: 07/10/18  3:24 AM   Result Value Ref Range   Lactic Acid, Venous 1.3 0.5 - 1.9 mmol/L    Comment: Performed at [facility] [address], [place], [place] [address]  Cortisol     Status: None   Collection Time: 07/10/18  3:24 AM  Result Value Ref Range   Cortisol, Plasma 5.5 ug/dL    Comment: (NOTE) AM    6.7 - 22.6 ug/dL PM   <10.0       ug/dL Performed at [facility] [address] [place], [place] [address]    Ct Angio Head W Or Wo Contrast  Result Date: 07/10/2018 CLINICAL DATA:  Stroke follow-up EXAM: CT ANGIOGRAPHY HEAD AND NECK TECHNIQUE: Multidetector CT imaging of the head and neck was performed using the standard protocol during bolus administration of intravenous contrast. Multiplanar CT image reconstructions and MIPs were obtained to evaluate the vascular anatomy. Carotid stenosis measurements (when applicable) are obtained utilizing NASCET criteria, using the distal internal carotid diameter as the denominator. CONTRAST:  137m OMNIPAQUE IOHEXOL 350 MG/ML SOLN COMPARISON:  CT head without contrast 07/09/2018 FINDINGS: CTA NECK FINDINGS Aortic arch: A 3 vessel arch configuration is present. Atherosclerotic changes are present at the aorta and origins of the great vessels without significant aneurysm or stenosis. Right carotid system: The right common carotid artery is within normal limits. Right carotid endarterectomy is noted. There is no residual recurrent stenosis. Distal cervical right ICA is normal. Left carotid system: The left common carotid artery is tortuous without significant stenosis. Atherosclerotic changes are present at the left carotid bifurcation. There is no significant stenosis relative to the more distal vessel. The distal left ICA is normal. Vertebral arteries: The left vertebral artery is the dominant vessel. Vertebral arteries originate from the subclavian arteries bilaterally without significant stenosis. There is no significant stenosis or vascular injury to  either vertebral artery in the neck. Skeleton: A chronic type 2 dens fracture is stable. Grade 1 degenerative anterolisthesis at C3-4 and C4-5 is stable. There is stable fusion at C5-6 and C6-7. No focal lytic or blastic lesions are present. The patient is edentulous. Dental implant post are noted in the mandible. Other neck: The soft tissues the neck are otherwise unremarkable. Upper chest: Patchy ground-glass attenuation is present throughout both lungs. Review of the MIP images confirms the above findings CTA HEAD  FINDINGS Anterior circulation: Atherosclerotic calcifications are present within the cavernous internal carotid arteries bilaterally. There is no significant stenosis through the ICA termini. The A1 and M1 segments are normal. MCA bifurcations are intact. There is some irregularity of distal ACA and MCA branch vessels without a significant proximal stenosis or occlusion. Posterior circulation: The left vertebral artery is the dominant vessel. PICA origins are visualized and normal. Vertebrobasilar junction is normal. Basilar artery is within normal limits. Both posterior cerebral arteries originate from the basilar tip. The PCA branch vessels are within normal limits. Venous sinuses: Dural sinuses are patent. Straight sinus and deep cerebral veins are patent. Cortical veins are unremarkable. Anatomic variants: None Delayed phase: No pathologic enhancement is present. Moderate white matter disease is noted. Review of the MIP images confirms the above findings IMPRESSION: 1. Right carotid endarterectomy is patent. 2. The large vessel occlusion. 3. Mild diffuse distal small vessel disease. 4. Atherosclerotic changes at the left carotid bifurcation without a significant stenosis. 5. Atherosclerotic changes at the aortic arch and cavernous internal carotid arteries bilaterally without a significant stenosis. 6. Chronic type 2 dens fracture. 7. Degenerative changes of the cervical spine are stable.  Electronically Signed   By: San Morelle M.D.   On: 07/10/2018 04:47   Dg Chest 2 View  Result Date: 07/09/2018 CLINICAL DATA:  Fatigue and weakness EXAM: CHEST - 2 VIEW COMPARISON:  04/02/2018 FINDINGS: Cardiac shadow is mildly enlarged. Pacing device is again seen and stable. The lungs are well aerated bilaterally. Chronic interstitial changes are noted bilaterally without focal infiltrate or effusion. No acute bony abnormality is noted. IMPRESSION: Chronic changes without acute abnormality. Electronically Signed   By: Inez Catalina M.D.   On: 07/09/2018 22:43   Ct Head Wo Contrast  Result Date: 07/10/2018 CLINICAL DATA:  83 y/o F; fatigue, weakness, loss of balance. History of stroke. EXAM: CT HEAD WITHOUT CONTRAST TECHNIQUE: Contiguous axial images were obtained from the base of the skull through the vertex without intravenous contrast. COMPARISON:  06/27/2018 CT head FINDINGS: Brain: No evidence of acute infarction, hemorrhage, hydrocephalus, extra-axial collection or mass lesion/mass effect. Stable nonspecific white matter hypodensities compatible with chronic microvascular ischemic changes. Stable volume loss of the brain. Vascular: Calcific atherosclerosis of the carotid siphons. No hyperdense vessel identified. Skull: Chronic C1 arch fractures, partially visualized. No calvarial fracture. Sinuses/Orbits: No acute finding. Other: Bilateral intra-ocular lens replacement. IMPRESSION: 1. No acute intracranial abnormality identified. 2. Stable chronic microvascular ischemic changes and volume loss of the brain. Electronically Signed   By: Kristine Garbe M.D.   On: 07/10/2018 00:00   Ct Angio Neck W Or Wo Contrast  Result Date: 07/10/2018 CLINICAL DATA:  Stroke follow-up EXAM: CT ANGIOGRAPHY HEAD AND NECK TECHNIQUE: Multidetector CT imaging of the head and neck was performed using the standard protocol during bolus administration of intravenous contrast. Multiplanar CT image  reconstructions and MIPs were obtained to evaluate the vascular anatomy. Carotid stenosis measurements (when applicable) are obtained utilizing NASCET criteria, using the distal internal carotid diameter as the denominator. CONTRAST:  138m OMNIPAQUE IOHEXOL 350 MG/ML SOLN COMPARISON:  CT head without contrast 07/09/2018 FINDINGS: CTA NECK FINDINGS Aortic arch: A 3 vessel arch configuration is present. Atherosclerotic changes are present at the aorta and origins of the great vessels without significant aneurysm or stenosis. Right carotid system: The right common carotid artery is within normal limits. Right carotid endarterectomy is noted. There is no residual recurrent stenosis. Distal cervical right ICA is normal. Left carotid system: The  left common carotid artery is tortuous without significant stenosis. Atherosclerotic changes are present at the left carotid bifurcation. There is no significant stenosis relative to the more distal vessel. The distal left ICA is normal. Vertebral arteries: The left vertebral artery is the dominant vessel. Vertebral arteries originate from the subclavian arteries bilaterally without significant stenosis. There is no significant stenosis or vascular injury to either vertebral artery in the neck. Skeleton: A chronic type 2 dens fracture is stable. Grade 1 degenerative anterolisthesis at C3-4 and C4-5 is stable. There is stable fusion at C5-6 and C6-7. No focal lytic or blastic lesions are present. The patient is edentulous. Dental implant post are noted in the mandible. Other neck: The soft tissues the neck are otherwise unremarkable. Upper chest: Patchy ground-glass attenuation is present throughout both lungs. Review of the MIP images confirms the above findings CTA HEAD FINDINGS Anterior circulation: Atherosclerotic calcifications are present within the cavernous internal carotid arteries bilaterally. There is no significant stenosis through the ICA termini. The A1 and M1  segments are normal. MCA bifurcations are intact. There is some irregularity of distal ACA and MCA branch vessels without a significant proximal stenosis or occlusion. Posterior circulation: The left vertebral artery is the dominant vessel. PICA origins are visualized and normal. Vertebrobasilar junction is normal. Basilar artery is within normal limits. Both posterior cerebral arteries originate from the basilar tip. The PCA branch vessels are within normal limits. Venous sinuses: Dural sinuses are patent. Straight sinus and deep cerebral veins are patent. Cortical veins are unremarkable. Anatomic variants: None Delayed phase: No pathologic enhancement is present. Moderate white matter disease is noted. Review of the MIP images confirms the above findings IMPRESSION: 1. Right carotid endarterectomy is patent. 2. The large vessel occlusion. 3. Mild diffuse distal small vessel disease. 4. Atherosclerotic changes at the left carotid bifurcation without a significant stenosis. 5. Atherosclerotic changes at the aortic arch and cavernous internal carotid arteries bilaterally without a significant stenosis. 6. Chronic type 2 dens fracture. 7. Degenerative changes of the cervical spine are stable. Electronically Signed   By: San Morelle M.D.   On: 07/10/2018 04:47    Assessment: 83 y.o. female presenting with acute onset of fatigue, increased weakness and loss of balance  1. Exam is nonfocal. DDx for the patient's presentation includes TIA. However, based on her endorsing symptoms of light-headedness at the time of her weakness, absence of symptoms of lateralized weakness and recurrence of lightheadedness and unsteadiness here in-house when SBP dropped below 100, it is felt that her presentation is most likely secondary to transient hypotension. Also increasing the likelihood of hypotension as the etiology for her presentation is dry oral mucosa on exam and her history of CHF.   2. CT head: No acute  intracranial abnormality identified. Stable chronic microvascular ischemic changes and volume loss of the brain. 3. CTA of head and neck: Right carotid endarterectomy is patent. No large vessel occlusion. Mild diffuse distal small vessel disease. Atherosclerotic changes at the left carotid bifurcation without a significant stenosis. Atherosclerotic changes at the aortic arch and cavernous internal carotid arteries bilaterally without a significant stenosis.  4. Cervical spine incidentally imaged portions on CTA: Chronic type 2 dens fracture. Degenerative changes of the cervical spine are stable.  5. Stroke Risk Factors - CHF, DM, HTN and CAD 6. Unable to perform MRI brain due to pacemaker.   Plan: 1. HgbA1c, fasting lipid panel 2. TTE 3. PT consult, OT consult, Speech consult 4. Repeat CT head in 72  hours 5. Continue NOAC and statin 6. Telemetry monitoring 7. Frequent neuro checks 8. Volume repletion. Manage fluids cautiously in the setting of CHF.  9. Orthostatics  _0  signed: Dr. Kerney Elbe  07/10/2018, 5:55 AM

## 2018-07-10 NOTE — Evaluation (Signed)
Occupational Therapy Evaluation Patient Details Name: Meredith Gonzalez MRN: [id number] DOB: [birth year] Today's Date: 07/10/2018    History of Present Illness 83 y/o female admitted with dizziness, left hip painful since fall in April of this year. CT negative this admission, no MRI due to pacemaker. PMH significant for recent stroke, DM, HTN, pacemaker, CHF, cervical fracture, femur fracture, recurrent falls. S/p right CEA.    Clinical Impression   This 83 yo female admitted with above presents to acute OT with multiple falls (one requiring neck surgery and one in April of this year of which she still has left hip pain) all affecting her safety and independence with basic ADLs. Pt would really benefit from follow up at SNF, but due to admission status and insurance pt cannot go there, so HHOT with 24 hour A/S is recommended.    Follow Up Recommendations  SNF;Supervision/Assistance - 24 hour;Other (comment)(however pt is Medicare/observation so it will have to be HHOT/24 hour A )    Equipment Recommendations  None recommended by OT       Precautions / Restrictions Precautions Precautions: Fall Precaution Comments: many falls with Sept neck fracture, April 2020 hurt left hip with indeterminate age of L-spine fracutures Restrictions Weight Bearing Restrictions: No      Mobility Bed Mobility Overal bed mobility: Needs Assistance Bed Mobility: Supine to Sit     Supine to sit: Min guard;HOB elevated     General bed mobility comments: increased time and use of rails due to left hip pain  Transfers Overall transfer level: Needs assistance Equipment used: Rolling walker (2 wheeled) Transfers: Sit to/from Stand Sit to Stand: Min assist         General transfer comment: increased time due to hip pain    Balance Overall balance assessment: Needs assistance Sitting-balance support: Feet supported;Single extremity supported   Sitting balance - Comments: Feels she needs at least one  hand on bed for sitting due to hip pain   Standing balance support: Bilateral upper extremity supported Standing balance-Leahy Scale: Poor                             ADL either performed or assessed with clinical judgement   ADL Overall ADL's : Needs assistance/impaired Eating/Feeding: Independent;Sitting   Grooming: Set up;Sitting   Upper Body Bathing: Set up;Sitting   Lower Body Bathing: Minimal assistance;Sit to/from stand   Upper Body Dressing : Set up;Sitting   Lower Body Dressing: Moderate assistance Lower Body Dressing Details (indicate cue type and reason): Min A sit<>stand Toilet Transfer: Minimal assistance;Ambulation;RW Toilet Transfer Details (indicate cue type and reason): Bed>around to recliner Toileting- Clothing Manipulation and Hygiene: Minimal assistance;Sit to/from stand               Vision Baseline Vision/History: Wears glasses Wears Glasses: Reading only Patient Visual Report: No change from baseline              Pertinent Vitals/Pain Pain Assessment: 0-10 Pain Score: 6  Pain Location: Left hip Pain Descriptors / Indicators: Aching;Sore Pain Intervention(s): Patient requesting pain meds-RN notified;Repositioned(asked RN to get K-pad for pt)     Hand Dominance Right   Extremity/Trunk Assessment Upper Extremity Assessment Upper Extremity Assessment: Overall WFL for tasks assessed   Lower Extremity Assessment Lower Extremity Assessment: Defer to PT evaluation       Communication Communication Communication: No difficulties   Cognition Arousal/Alertness: Awake/alert Behavior During Therapy: WFL for tasks assessed/performed Overall Cognitive  Status: Within Functional Limits for tasks assessed                                                Home Living Family/patient expects to be discharged to:: Private residence Living Arrangements: Alone Available Help at Discharge: Family;Available  PRN/intermittently Type of Home: House Home Access: Level entry     Home Layout: One level     Bathroom Shower/Tub: Walk-in shower(no threshold)   Bathroom Toilet: Standard Bathroom Accessibility: Yes How Accessible: Accessible via walker;Accessible via wheelchair Home Equipment: Gilford Rile - 4 wheels;Walker - 2 wheels;Bedside commode;Shower seat - built in;Grab bars - tub/shower;Adaptive equipment Adaptive Equipment: Reacher    Lives With: Alone    Prior Functioning/Environment Level of Independence: Independent with assistive device(s)        Comments: since recent hospitalization rollator and RW in the home        OT Problem List: Decreased strength;Decreased range of motion;Decreased activity tolerance;Impaired balance (sitting and/or standing);Pain      OT Treatment/Interventions: Self-care/ADL training;Balance training;DME and/or AE instruction;Patient/family education    OT Goals(Current goals can be found in the care plan section) Acute Rehab OT Goals Patient Stated Goal: to not feel off balance when she is up on her feet OT Goal Formulation: With patient Time For Goal Achievement: 07/24/18 Potential to Achieve Goals: Good  OT Frequency: Min 3X/week   Barriers to D/C: Decreased caregiver support             AM-PAC OT "6 Clicks" Daily Activity     Outcome Measure Help from another person eating meals?: None Help from another person taking care of personal grooming?: A Little Help from another person toileting, which includes using toliet, bedpan, or urinal?: A Little Help from another person bathing (including washing, rinsing, drying)?: A Little Help from another person to put on and taking off regular upper body clothing?: A Little Help from another person to put on and taking off regular lower body clothing?: A Lot 6 Click Score: 18   End of Session Equipment Utilized During Treatment: Gait belt;Rolling walker Nurse Communication: (please order K-pad (pt  reports heat is what helps her pain))  Activity Tolerance: Patient tolerated treatment well Patient left: in chair;with call bell/phone within reach;with chair alarm set  OT Visit Diagnosis: Unsteadiness on feet (R26.81);Other abnormalities of gait and mobility (R26.89);Pain Pain - Right/Left: Left Pain - part of body: Hip                Time: 0762-2633 OT Time Calculation (min): 46 min Charges:  OT General Charges $OT Visit: 1 Visit OT Evaluation $OT Eval Moderate Complexity: 1 Mod OT Treatments $Self Care/Home Management : 23-37 mins  [place] Circle, OTR/L Acute [employer] Pager [phone] Office [phone]     Almon Register 07/10/2018, 11:14 AM

## 2018-07-10 NOTE — Progress Notes (Signed)
STROKE TEAM PROGRESS NOTE   INTERVAL HISTORY I have personally reviewed patient's history of presenting illness.  She states that she was off balance not vertiginous he denied any other accompanying focal symptoms.  Her blood pressure was significantly elevated.  She did have a previous stroke a month ago residual left-sided weakness and underwent elective right carotid endarterectomy as well.  Vitals:   07/10/18 0600 07/10/18 0830 07/10/18 0859 07/10/18 1000  BP: (!) 116/50 139/76 129/74 (!) 144/67  Pulse: 68 69 68 69  Resp: 18     Temp: 97.7 F (36.5 C)  97.8 F (36.6 C)   TempSrc: Oral  Oral   SpO2: 98% 99%  99%  Weight:      Height:        CBC:  Recent Labs  Lab 07/09/18 2129 07/10/18 0324  WBC 8.2 8.5  NEUTROABS 6.5  --   HGB 12.1 10.7*  HCT 39.4 35.5*  MCV 106.5* 105.3*  PLT 324 272    Basic Metabolic Panel:  Recent Labs  Lab 07/09/18 2129 07/10/18 0324  NA 136 138  K 4.1 3.9  CL 110 111  CO2 20* 21*  GLUCOSE 115* 110*  BUN 22 16  CREATININE 0.69 0.64  CALCIUM 8.5* 8.2*   Lipid Panel:     Component Value Date/Time   CHOL 88 07/10/2018 0324   CHOL 95 (L) 03/20/2017 0728   TRIG 26 07/10/2018 0324   HDL 52 07/10/2018 0324   HDL 58 03/20/2017 0728   CHOLHDL 1.7 07/10/2018 0324   VLDL 5 07/10/2018 0324   LDLCALC 31 07/10/2018 0324   LDLCALC 28 03/20/2017 0728   HgbA1c:  Lab Results  Component Value Date   HGBA1C 5.1 07/10/2018   Urine Drug Screen: No results found for: LABOPIA, COCAINSCRNUR, LABBENZ, AMPHETMU, THCU, LABBARB  Alcohol Level     Component Value Date/Time   ETH <10 04/24/2018 0545    IMAGING Ct Angio Head W Or Wo Contrast  Result Date: 07/10/2018 CLINICAL DATA:  Stroke follow-up EXAM: CT ANGIOGRAPHY HEAD AND NECK TECHNIQUE: Multidetector CT imaging of the head and neck was performed using the standard protocol during bolus administration of intravenous contrast. Multiplanar CT image reconstructions and MIPs were obtained to  evaluate the vascular anatomy. Carotid stenosis measurements (when applicable) are obtained utilizing NASCET criteria, using the distal internal carotid diameter as the denominator. CONTRAST:  130mL OMNIPAQUE IOHEXOL 350 MG/ML SOLN COMPARISON:  CT head without contrast 07/09/2018 FINDINGS: CTA NECK FINDINGS Aortic arch: A 3 vessel arch configuration is present. Atherosclerotic changes are present at the aorta and origins of the great vessels without significant aneurysm or stenosis. Right carotid system: The right common carotid artery is within normal limits. Right carotid endarterectomy is noted. There is no residual recurrent stenosis. Distal cervical right ICA is normal. Left carotid system: The left common carotid artery is tortuous without significant stenosis. Atherosclerotic changes are present at the left carotid bifurcation. There is no significant stenosis relative to the more distal vessel. The distal left ICA is normal. Vertebral arteries: The left vertebral artery is the dominant vessel. Vertebral arteries originate from the subclavian arteries bilaterally without significant stenosis. There is no significant stenosis or vascular injury to either vertebral artery in the neck. Skeleton: A chronic type 2 dens fracture is stable. Grade 1 degenerative anterolisthesis at C3-4 and C4-5 is stable. There is stable fusion at C5-6 and C6-7. No focal lytic or blastic lesions are present. The patient is edentulous. Dental implant post  are noted in the mandible. Other neck: The soft tissues the neck are otherwise unremarkable. Upper chest: Patchy ground-glass attenuation is present throughout both lungs. Review of the MIP images confirms the above findings CTA HEAD FINDINGS Anterior circulation: Atherosclerotic calcifications are present within the cavernous internal carotid arteries bilaterally. There is no significant stenosis through the ICA termini. The A1 and M1 segments are normal. MCA bifurcations are  intact. There is some irregularity of distal ACA and MCA branch vessels without a significant proximal stenosis or occlusion. Posterior circulation: The left vertebral artery is the dominant vessel. PICA origins are visualized and normal. Vertebrobasilar junction is normal. Basilar artery is within normal limits. Both posterior cerebral arteries originate from the basilar tip. The PCA branch vessels are within normal limits. Venous sinuses: Dural sinuses are patent. Straight sinus and deep cerebral veins are patent. Cortical veins are unremarkable. Anatomic variants: None Delayed phase: No pathologic enhancement is present. Moderate white matter disease is noted. Review of the MIP images confirms the above findings IMPRESSION: 1. Right carotid endarterectomy is patent. 2. The large vessel occlusion. 3. Mild diffuse distal small vessel disease. 4. Atherosclerotic changes at the left carotid bifurcation without a significant stenosis. 5. Atherosclerotic changes at the aortic arch and cavernous internal carotid arteries bilaterally without a significant stenosis. 6. Chronic type 2 dens fracture. 7. Degenerative changes of the cervical spine are stable. Electronically Signed   By: San Morelle M.D.   On: 07/10/2018 04:47   Dg Chest 2 View  Result Date: 07/09/2018 CLINICAL DATA:  Fatigue and weakness EXAM: CHEST - 2 VIEW COMPARISON:  04/02/2018 FINDINGS: Cardiac shadow is mildly enlarged. Pacing device is again seen and stable. The lungs are well aerated bilaterally. Chronic interstitial changes are noted bilaterally without focal infiltrate or effusion. No acute bony abnormality is noted. IMPRESSION: Chronic changes without acute abnormality. Electronically Signed   By: Inez Catalina M.D.   On: 07/09/2018 22:43   Ct Head Wo Contrast  Result Date: 07/10/2018 CLINICAL DATA:  83 y/o F; fatigue, weakness, loss of balance. History of stroke. EXAM: CT HEAD WITHOUT CONTRAST TECHNIQUE: Contiguous axial images  were obtained from the base of the skull through the vertex without intravenous contrast. COMPARISON:  06/27/2018 CT head FINDINGS: Brain: No evidence of acute infarction, hemorrhage, hydrocephalus, extra-axial collection or mass lesion/mass effect. Stable nonspecific white matter hypodensities compatible with chronic microvascular ischemic changes. Stable volume loss of the brain. Vascular: Calcific atherosclerosis of the carotid siphons. No hyperdense vessel identified. Skull: Chronic C1 arch fractures, partially visualized. No calvarial fracture. Sinuses/Orbits: No acute finding. Other: Bilateral intra-ocular lens replacement. IMPRESSION: 1. No acute intracranial abnormality identified. 2. Stable chronic microvascular ischemic changes and volume loss of the brain. Electronically Signed   By: Kristine Garbe M.D.   On: 07/10/2018 00:00   Ct Angio Neck W Or Wo Contrast  Result Date: 07/10/2018 CLINICAL DATA:  Stroke follow-up EXAM: CT ANGIOGRAPHY HEAD AND NECK TECHNIQUE: Multidetector CT imaging of the head and neck was performed using the standard protocol during bolus administration of intravenous contrast. Multiplanar CT image reconstructions and MIPs were obtained to evaluate the vascular anatomy. Carotid stenosis measurements (when applicable) are obtained utilizing NASCET criteria, using the distal internal carotid diameter as the denominator. CONTRAST:  154mL OMNIPAQUE IOHEXOL 350 MG/ML SOLN COMPARISON:  CT head without contrast 07/09/2018 FINDINGS: CTA NECK FINDINGS Aortic arch: A 3 vessel arch configuration is present. Atherosclerotic changes are present at the aorta and origins of the great vessels without significant  aneurysm or stenosis. Right carotid system: The right common carotid artery is within normal limits. Right carotid endarterectomy is noted. There is no residual recurrent stenosis. Distal cervical right ICA is normal. Left carotid system: The left common carotid artery is  tortuous without significant stenosis. Atherosclerotic changes are present at the left carotid bifurcation. There is no significant stenosis relative to the more distal vessel. The distal left ICA is normal. Vertebral arteries: The left vertebral artery is the dominant vessel. Vertebral arteries originate from the subclavian arteries bilaterally without significant stenosis. There is no significant stenosis or vascular injury to either vertebral artery in the neck. Skeleton: A chronic type 2 dens fracture is stable. Grade 1 degenerative anterolisthesis at C3-4 and C4-5 is stable. There is stable fusion at C5-6 and C6-7. No focal lytic or blastic lesions are present. The patient is edentulous. Dental implant post are noted in the mandible. Other neck: The soft tissues the neck are otherwise unremarkable. Upper chest: Patchy ground-glass attenuation is present throughout both lungs. Review of the MIP images confirms the above findings CTA HEAD FINDINGS Anterior circulation: Atherosclerotic calcifications are present within the cavernous internal carotid arteries bilaterally. There is no significant stenosis through the ICA termini. The A1 and M1 segments are normal. MCA bifurcations are intact. There is some irregularity of distal ACA and MCA branch vessels without a significant proximal stenosis or occlusion. Posterior circulation: The left vertebral artery is the dominant vessel. PICA origins are visualized and normal. Vertebrobasilar junction is normal. Basilar artery is within normal limits. Both posterior cerebral arteries originate from the basilar tip. The PCA branch vessels are within normal limits. Venous sinuses: Dural sinuses are patent. Straight sinus and deep cerebral veins are patent. Cortical veins are unremarkable. Anatomic variants: None Delayed phase: No pathologic enhancement is present. Moderate white matter disease is noted. Review of the MIP images confirms the above findings IMPRESSION: 1. Right  carotid endarterectomy is patent. 2. The large vessel occlusion. 3. Mild diffuse distal small vessel disease. 4. Atherosclerotic changes at the left carotid bifurcation without a significant stenosis. 5. Atherosclerotic changes at the aortic arch and cavernous internal carotid arteries bilaterally without a significant stenosis. 6. Chronic type 2 dens fracture. 7. Degenerative changes of the cervical spine are stable. Electronically Signed   By: San Morelle M.D.   On: 07/10/2018 04:47    PHYSICAL EXAM Pleasant frail elderly Caucasian lady not in distress. . Afebrile. Head is nontraumatic. Neck is supple without bruit.    Cardiac exam no murmur or gallop. Lungs are clear to auscultation. Distal pulses are well felt. Neurological Exam ;  Awake  Alert oriented x 3. Normal speech and language.eye movements full without nystagmus.fundi were not visualized. Vision acuity and fields appear normal. Hearing is normal. Palatal movements are normal. Face asymmetric with left lower facial weakness. Tongue midline. Normal strength, tone, reflexes and coordination except diminished fine finger movements on the left.  Orbits right over left upper extremity.. Normal sensation. Gait deferred.  ASSESSMENT/PLAN Meredith Gonzalez is a 83 y.o. female with history of recent stroke March 2020, CHF, HTN and pacemaker presenting with fatigue, increased weakness and loss of balance in setting of elevated systolic blood pressure 932.   Possible TIA versus dizzy episode secondary to medication  CXR -chronic changes, NAD  CT head No acute stroke. Stable Small vessel disease and  Atrophy.   CTA head & neck R CEA patent.  No LVO. Mild Small vessel disease.  Atherosclerosis L ICA, aortic arch,  B ICA.  Chronic type II dens fracture.  Degenerative changes cervical spine stable.  MRI  / MRA pacer  2D Echo 05/31/2018 EF 60-65%. No source of embolus   LDL 31  HgbA1c 5.1  Eliquis for VTE prophylaxis  Eliquis  (apixaban) daily prior to admission, now on Eliquis (apixaban) daily.   No further stroke workup indicated  Stroke team will sign off. Please call with questions.  Continue f/u recommendations in place from last admission  Atrial Fibrillation  Home anticoagulation:  Eliquis (apixaban) daily continued in the hospital . Continue Eliquis (apixaban) daily at discharge   Hypertension  Home meds:   eliquis   table . Permissive hypertension (OK if < 220/120) but gradually normalize in 5-7 days . Long-term BP goal normotensive  Hyperlipidemia  Lipitor 40 prior to stroke in March decreased to Lipitor 20 at time of discharge  LDL 31, at goal < 70  Continue statin at discharge  Diabetes type II Controlled  Diet controlled   HgbA1c 5.1, at goal < 7.0  Other Stroke Risk Factors  Advanced age  Former cigarette smoker -quit 24 years ago   ETOH use, advised to drink no more than 1 drink(s) a day  Hx stroke/TIA  05/31/2018 - Possible small right brain stroke - concerning recurrent stroke symptoms due to right ICA 60% stenosis with significant plaque formation.  Although A. fib as etiology of current stroke cannot be completely excluded, however patient compliant with Eliquis and recent two strokes are with similar left-sided symptoms.  Resultant R CEA performed.  03/2018 patient was admitted for stroke symptoms.  However, the exact symptoms were not quite clear, including left sided tingling, slurred speech, left-sided ataxia, expressive aphasia and diplopia.  There was question whether this was more consistent with right MCA involvement or posterior circulation ischemia.  MRI cannot be done due to pacemaker.  CT no acute abnormality x2.  CT head and neck right ICA 60% stenosis with bulky soft plaques.  Carotid Doppler negative.  EF 60 to 65% LDL 52 and A1c 6.3.  Dr. Donnetta Hutching was consulted for potential right CEA, but eventually that was not performed given not able to rule out posterior  circulation ischemia and a stroke likely due to A. fib not on anticoagulation.  She was discharged with Eliquis, however on lower dose at 2.5 mg twice daily.  Coronary artery disease  Congestive heart failure status post pacemaker  Other Active Problems  CT head redemonstrated chronic fractures of the dens and C1 vertebral body, stable from CT cervical spine 12/2017.  Patient has been following with Dr. Vertell Limber for this issue, no surgery was required.  make appointment with Dr. Vertell Limber for continued follow-up again recommended.  Hospital day # 0  He presented with transient dizziness and imbalance secondary to significantly elevated blood pressure.posterior circulation TIA.  Patient cannot have an MRI due to pacemaker.  Continue Eliquis for stroke prevention for atrial fibrillation aggressive risk factor modification.  No further stroke work-up is necessary as she had a complete work-up a month ago.  Discussed with patient and Dr. [name].  Greater than 50% time during this 25-minute visit was spent on counseling and coordination of care about stroke versus TIA and answering questions  To contact Stroke Continuity provider, please refer to [email or web address] After hours, contact General Neurology

## 2018-07-10 NOTE — Progress Notes (Signed)
Patient arrived in the floor at 0145 am from [facility] escorted by [facility],  Alert and oriented x4, c/o feeling left hip pain while moving to bed, positioned  in a bed comfortably, all safety and comfort measures are in placed,  Paged to pt placement unit, will keep pt on tele monitor, and continue to monitor closely.

## 2018-07-10 NOTE — Evaluation (Signed)
Speech Language Pathology Evaluation Patient Details Name: Meredith Gonzalez MRN: [id number] DOB: 05/22/1935 Today's Date: 07/10/2018 Time: 2542-7062 SLP Time Calculation (min) (ACUTE ONLY): 26 min  Problem List:  Patient Active Problem List   Diagnosis Date Noted  . Dizziness 07/09/2018  . Stroke due to embolism of carotid artery ([place]) 06/05/2018  . Stroke-like symptom   . Dyspnea   . Acute blood loss anemia   . Acute ischemic stroke ([place]) 05/31/2018  . Stroke ([place]) 05/30/2018  . Chronic diastolic congestive heart failure ([place])   . Anemia of chronic disease   . Hypoalbuminemia due to protein-calorie malnutrition ([place])   . Labile blood pressure   . Atrial fibrillation ([place])   . Chronic neck pain   . Subcortical infarction ([place]) 04/27/2018  . CVA (cerebral vascular accident) ([place]) 04/26/2018  . TIA (transient ischemic attack) 04/24/2018  . CAD (coronary artery disease) 08/07/2016  . (HFpEF) heart failure with preserved ejection fraction ([place]) 08/07/2016  . Ischemic cardiomyopathy 08/07/2016  . Chest pain   . NSTEMI (non-ST elevated myocardial infarction) ([place])   . Status post coronary artery stent placement   . Abnormal stress test   . SOB (shortness of breath)   . Multifocal pneumonia 07/13/2016  . Elevated troponin 07/13/2016  . Leukocytosis   . Pacemaker 05/17/2014  . Complications, pacemaker cardiac, mechanical 12/31/2012  . Edema extremities 10/08/2012  . Persistent atrial fibrillation 10/08/2012  . Hypertension 10/08/2012  . Hypothyroidism 10/08/2012  . Neuropathy 10/08/2012  . Diabetes ([place]) 10/08/2012  . Depression 09/08/2012  . Anemia 08/08/2012  . Knee fracture, left 08/07/2012  . Recurrent falls 08/07/2012  . Syncope 08/07/2012  . SDH (subdural hematoma) ([place]) 08/07/2012   Past Medical History:  Past Medical History:  Diagnosis Date  . Anemia   . Anxiety   . Arthritis    "all over" (02/11/2018)  . Basal cell carcinoma    "left leg" (02/11/2018)  .  Cervical spine fracture ([place]) 12/2017   "C1-2"  . CHF (congestive heart failure) ([place])   . Chronic bronchitis ([place])   . Chronic neck pain    "since I broke my neck 6-8 wk ago" (02/11/2018)  . Diabetic peripheral neuropathy ([place])   . Fibromyalgia   . Fracture of right humerus   . Generalized weakness   . Headache    "weekly" (02/11/2018)  . History of blood transfusion    "related to one of my femur surgeries" (02/11/2018)  . History of echocardiogram    Echo 8/18: EF 50-55, no RWMA, Gr 1 DD, calcified AV leaflets, MAC, trivial MR, mod LAE, PASP 37  . Hypertension   . Hypothyroidism   . Incontinence of urine   . Ischemic stroke ([place]) 2000   "lost part of the vision in my right eye" (02/11/2018)  . Major depression, chronic   . Myocardial infarction ([place]) 06/2016  . Pacemaker   . Recurrent falls   . Syncope and collapse   . Type 2 diabetes, diet controlled ([place])    Past Surgical History:  Past Surgical History:  Procedure Laterality Date  . ABDOMINAL HYSTERECTOMY    . ANKLE FRACTURE SURGERY Right   . BASAL CELL CARCINOMA EXCISION Left    "leg"  . CARDIAC CATHETERIZATION  ~ 2014  . CARDIOVERSION N/A 02/13/2018   Procedure: CARDIOVERSION;  Surgeon: Sueanne Margarita, MD;  Location: [facility] ENDOSCOPY;  Service: Cardiovascular;  Laterality: N/A;  . CARDIOVERSION N/A 03/27/2018   Procedure: CARDIOVERSION;  Surgeon: Lelon Perla, MD;  Location: [facility]  ENDOSCOPY;  Service: Cardiovascular;  Laterality: N/A;  . CARPAL TUNNEL RELEASE     bilateral  . CATARACT EXTRACTION W/ INTRAOCULAR LENS  IMPLANT, BILATERAL Bilateral   . CHOLECYSTECTOMY OPEN    . COLONOSCOPY    . CORONARY ANGIOPLASTY WITH STENT PLACEMENT  06/2016  . CORONARY STENT INTERVENTION N/A 07/19/2016   Procedure: Coronary Stent Intervention;  Surgeon: Peter M Martinique, MD;  Location: [place] CV LAB;  Service: Cardiovascular;  Laterality: N/A;  . ENDARTERECTOMY Right 06/03/2018   Procedure: ENDARTERECTOMY CAROTID;  Surgeon:  Rosetta Posner, MD;  Location: [place];  Service: Vascular;  Laterality: Right;  . EYE SURGERY     right eye catarace/lens implant  . FEMUR FRACTURE SURGERY Bilateral   . FRACTURE SURGERY    . GASTRIC BYPASS    . INSERT / REPLACE / REMOVE PACEMAKER  ~ 2012  . LEAD REVISION N/A 01/01/2013   Procedure: LEAD REVISION;  Surgeon: Evans Lance, MD;  Location[facility] CATH LAB;  Service: Cardiovascular;  Laterality: N/A;  . LEFT HEART CATH AND CORONARY ANGIOGRAPHY N/A 07/19/2016   Procedure: Left Heart Cath and Coronary Angiography;  Surgeon: Peter M Martinique, MD;  Location: [place] CV LAB;  Service: Cardiovascular;  Laterality: N/A;  . OVARIAN CYST SURGERY     "one side only"  . PATCH ANGIOPLASTY Right 06/03/2018   Procedure: PATCH ANGIOPLASTY USING HEMASHIELD PLATINUM FINESSE;  Surgeon: Rosetta Posner, MD;  Location: [place];  Service: Vascular;  Laterality: Right;  . TEE WITHOUT CARDIOVERSION N/A 02/13/2018   Procedure: TRANSESOPHAGEAL ECHOCARDIOGRAM (TEE);  Surgeon: Sueanne Margarita, MD;  Location: [facility] ENDOSCOPY;  Service: Cardiovascular;  Laterality: N/A;  . TONSILLECTOMY     HPI:  Pt is an 83 y.o. female with recent stroke in March, CHF, HTN and pacemaker who presented to the ED with complaints of fatigue, increased weakness and loss of balance for "the last few hours". CT of the head was negative.     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 [facility] Cognitive Assessment 8.1 was completed to evaluate the pt's cognitive-linguistic skills. She achieved a score of 28/30 which is within the normal limits of 26 or more out of 30 and no speech/language deficits were demonstrated. Further skilled SLP services are not clinically indicated at this time. Pt and nursing were educated regarding this and both parties verbalized understanding as well as agreement  with plan of care.    SLP Assessment  SLP Recommendation/Assessment: Patient does not need any further Speech Lanaguage Pathology Services SLP Visit Diagnosis: Cognitive communication deficit (R41.841)    Follow Up Recommendations  None    Frequency and Duration           SLP Evaluation Cognition  Overall Cognitive Status: Within Functional Limits for tasks assessed Arousal/Alertness: Awake/alert Orientation Level: Oriented X4 Attention: Focused;Sustained Focused Attention: Appears intact(Vigilance WNL: 1/1) Sustained Attention: Appears intact(Serial 7s: 3/3) Memory: Appears intact(Immediate: 5/5; Dealyed: 4/5) Awareness: Appears intact Problem Solving: Appears intact(4/4) Executive Function: Reasoning;Sequencing Reasoning: Appears intact(Abstraction: 2/2) Sequencing: Appears intact(Clock drawing: 3/3)       Comprehension  Auditory Comprehension Overall Auditory Comprehension: Appears within functional limits for tasks assessed Yes/No Questions: Within Functional Limits Commands: Within Functional Limits(complex commands: Trail completion- 1/1) Conversation: Complex Environmental consultant Discrimination: Within Function Limits Reading Comprehension Reading Status: Within funtional limits    Expression  Verbal Expression Overall Verbal Expression: Appears within functional limits for tasks assessed Initiation: No impairment Level of Generative/Spontaneous Verbalization: Conversation;Sentence Repetition: No impairment(Sentences: 2/3) Naming: No impairment Responsive: Not tested Confrontation: (3/3) Convergent: Not tested Divergent: (1/1) Pragmatics: No impairment Written Expression Dominant Hand: Right Written Expression: (Copying cube: 1/1)   Oral / Motor  Oral Motor/Sensory Function Overall Oral Motor/Sensory Function: Within functional limits Motor Speech Overall Motor Speech: Appears within functional limits for tasks assessed Respiration: Within  functional limits Phonation: Normal Resonance: Within functional limits Articulation: Within functional limitis Intelligibility: Intelligible Motor Planning: Witnin functional limits Motor Speech Errors: Not applicable   Synda Bagent I. Hardin Negus, [place], [place] Office number [phone] Pager [place] 07/10/2018, 11:08 AM

## 2018-07-10 NOTE — TOC Progression Note (Signed)
Transition of Care [facility]) - Progression Note    Patient Details  Name: Meredith Gonzalez MRN: [id number] Date of Birth: [birth year]  Transition of [facility]) CM/SW [place], [place] Phone Number: 07/10/2018, 5:02 PM  Clinical Narrative:   CSW spoke with the patient's son. He is concerned for his mother because her health has seriously declined over the last 7 months. The patient resides at [facility] [place] in [place]. He stated that his mother is falling a lot and has many hospitalizations. He is agreeable to SNF. CSW e-mailed him a copy of the CMS list. Patient has been to [facility] in the past but did not like it. Faxed patient out to [facility], and [facility].   CSW learned that the patient's family members were [facility]. CSW called Claiborne Billings at [facility] and sent over the information. Claiborne Billings would let CSW know on Saturday if they had a bed available in the next few days.   CSW will continue to facilitate placement.     Expected Discharge Plan: [place] Barriers to Discharge: Continued Medical Work up  Expected Discharge Plan and Services Expected Discharge Plan: [place] In-house Referral: Clinical Social Work     Living arrangements for the past 2 months: Single Family Home(in a retirment community)                           Social Determinants of Health (SDOH) Interventions    Readmission Risk Interventions No flowsheet data found.

## 2018-07-10 NOTE — Progress Notes (Signed)
SLP Cancellation Note  Patient Details Name: Daysy Santini MRN: [id number] DOB: 1935-04-19   Cancelled treatment:       Reason Eval/Treat Not Completed: Patient at procedure or test/unavailable. Pt currently ambulating with therapy. SLP will re-attempt as able.   Jasamine Pottinger I. Hardin Negus, [place], [place] Office number [phone] Pager [place] 07/10/2018, 10:01 AM

## 2018-07-11 DIAGNOSIS — Z950 Presence of cardiac pacemaker: Secondary | ICD-10-CM

## 2018-07-11 DIAGNOSIS — R030 Elevated blood-pressure reading, without diagnosis of hypertension: Secondary | ICD-10-CM

## 2018-07-11 DIAGNOSIS — E039 Hypothyroidism, unspecified: Secondary | ICD-10-CM

## 2018-07-11 DIAGNOSIS — Z79899 Other long term (current) drug therapy: Secondary | ICD-10-CM

## 2018-07-11 DIAGNOSIS — Z79890 Hormone replacement therapy (postmenopausal): Secondary | ICD-10-CM

## 2018-07-11 DIAGNOSIS — Z8679 Personal history of other diseases of the circulatory system: Secondary | ICD-10-CM

## 2018-07-11 DIAGNOSIS — Z66 Do not resuscitate: Secondary | ICD-10-CM

## 2018-07-11 DIAGNOSIS — I480 Paroxysmal atrial fibrillation: Secondary | ICD-10-CM

## 2018-07-11 DIAGNOSIS — Z7901 Long term (current) use of anticoagulants: Secondary | ICD-10-CM

## 2018-07-11 DIAGNOSIS — Z955 Presence of coronary angioplasty implant and graft: Secondary | ICD-10-CM

## 2018-07-11 DIAGNOSIS — I951 Orthostatic hypotension: Secondary | ICD-10-CM

## 2018-07-11 DIAGNOSIS — I2510 Atherosclerotic heart disease of native coronary artery without angina pectoris: Secondary | ICD-10-CM

## 2018-07-11 NOTE — Progress Notes (Signed)
PROGRESS NOTE    Meredith Gonzalez  YJE:[id number] DOB: Jul 22, 1935 DOA: 07/09/2018 PCP: Sinclair Ship, MD  Brief Narrative: 83 year old female with history of CAD, PCI and stenting, paroxysmal atrial fibrillation on apixaban, recently hospitalized in March for possible right brain stroke, could not have an MRI due to pacer, on subsequent eval was noted to have right carotid artery stenosis underwent right CEA in March, subsequently went to inpatient rehab and then home. -Currently lives by herself, uses a walker to ambulate has had at least a couple of falls at home presented to the ED with couple of episodes of dizziness, some of these would happen when she got up from a sitting or lying position but other episodes were random sudden without any associated change in position. -In the emergency room was unremarkable, exam is nonfocal orthostatics mildly positive, CT head was unremarkable, EKG showed paced rhythm  Assessment & Plan:   1. Transient dizziness  -Posterior circulation TIA suspected, orthostatic hypotension also contributing but symptomatology not consistent with this throughout as some episodes are random  -CT head unremarkable, cannot have MRI due to pacemaker -Appreciate neurology consult -Recommended to continue Eliquis for A. fib, secondary stroke prevention and risk factor modification -No further work-up recommended since she had an extensive stroke work-up last month -Patient likely has autonomic neuropathy due to age or possibly following CEA -supportive care, advised hydration, TED stockings -Pt eval completed, SNF recommended -social worker consulted  2. Elevated BP -Given mildly poor positive orthostatics and symptoms we will not add BP meds, tolerate higher supine BP  3. P.A. fib and a history of sinus node dysfunction status post pacemaker placement - -continue amiodarone and apixaban  4. CAD  -stable  5. Hypothyroidism -continue synthroid.  6. Recent carotid  endarterectomy after admission for stroke in March 2020.  7. History of diastolic CHF  -Euvolemic at this time, not on diuretics at baseline   DVT prophylaxis: Apixaban. Code Status: DNR. Family Communication: Discussed with patient. Disposition Plan: SNF when bed available.  Consults : Neurology.    Procedures:   Antimicrobials:    Subjective: -Feels okay, reports improvement in dizziness -She has been on gabapentin for over 10 years without problems, denies any changes in dosages  Objective: Vitals:   07/10/18 2001 07/10/18 2339 07/11/18 0358 07/11/18 0826  BP: 130/61 139/84 (!) 120/52 (!) 156/59  Pulse: 68 75 67 66  Resp: 16 17 16    Temp: 98.2 F (36.8 C) 97.9 F (36.6 C) 98.2 F (36.8 C) 98.1 F (36.7 C)  TempSrc: Oral Oral Oral Oral  SpO2: 96% 93% 97% 99%  Weight:      Height:        Intake/Output Summary (Last 24 hours) at 07/11/2018 1056 Last data filed at 07/11/2018 0300 Gross per 24 hour  Intake 1364.76 ml  Output --  Net 1364.76 ml   Filed Weights   07/09/18 2059  Weight: 55.3 kg    Examination:  General exam: Appears calm and comfortable  Respiratory system: Clear to auscultation. Respiratory effort normal. Cardiovascular system: S1 & S2 heard, Gastrointestinal system: Abdomen is nondistended, soft and nontender.Normal bowel sounds heard. Central nervous system: Alert and oriented. No focal neurological deficits. Extremities: no edema Skin: No rashes, lesions or ulcers Psychiatry:  Mood & affect appropriate.     Data Reviewed:   CBC: Recent Labs  Lab 07/09/18 2129 07/10/18 0324  WBC 8.2 8.5  NEUTROABS 6.5  --   HGB 12.1 10.7*  HCT 39.4 35.5*  MCV 106.5* 105.3*  PLT 324 527   Basic Metabolic Panel: Recent Labs  Lab 07/09/18 2129 07/10/18 0324  NA 136 138  K 4.1 3.9  CL 110 111  CO2 20* 21*  GLUCOSE 115* 110*  BUN 22 16  CREATININE 0.69 0.64  CALCIUM 8.5* 8.2*   GFR: Estimated Creatinine Clearance: 49 mL/min (by  C-G formula based on SCr of 0.64 mg/dL). Liver Function Tests: Recent Labs  Lab 07/10/18 0324  AST 24  ALT 21  ALKPHOS 152*  BILITOT 0.3  PROT 6.2*  ALBUMIN 2.7*   No results for input(s): LIPASE, AMYLASE in the last 168 hours. No results for input(s): AMMONIA in the last 168 hours. Coagulation Profile: No results for input(s): INR, PROTIME in the last 168 hours. Cardiac Enzymes: Recent Labs  Lab 07/09/18 2129  TROPONINI <0.03   BNP (last 3 results) No results for input(s): PROBNP in the last 8760 hours. HbA1C: Recent Labs    07/10/18 0324  HGBA1C 5.1   CBG: No results for input(s): GLUCAP in the last 168 hours. Lipid Profile: Recent Labs    07/10/18 0324  CHOL 88  HDL 52  LDLCALC 31  TRIG 26  CHOLHDL 1.7   Thyroid Function Tests: No results for input(s): TSH, T4TOTAL, FREET4, T3FREE, THYROIDAB in the last 72 hours. Anemia Panel: No results for input(s): VITAMINB12, FOLATE, FERRITIN, TIBC, IRON, RETICCTPCT in the last 72 hours. Urine analysis:    Component Value Date/Time   COLORURINE YELLOW 07/09/2018 2234   APPEARANCEUR CLEAR 07/09/2018 2234   LABSPEC 1.020 07/09/2018 2234   PHURINE 6.5 07/09/2018 [place] 07/09/2018 [facility] 07/09/2018 2234   BILIRUBINUR NEGATIVE 07/09/2018 2234   KETONESUR NEGATIVE 07/09/2018 2234   PROTEINUR NEGATIVE 07/09/2018 2234   NITRITE NEGATIVE 07/09/2018 2234   LEUKOCYTESUR TRACE (A) 07/09/2018 2234   Sepsis Labs: @LABRCNTIP (procalcitonin:4,lacticidven:4)  ) Recent Results (from the past 240 hour(s))  MRSA PCR Screening     Status: None   Collection Time: 07/10/18  4:45 PM  Result Value Ref Range Status   MRSA by PCR NEGATIVE NEGATIVE Final    Comment:        The GeneXpert MRSA Assay (FDA approved for NASAL specimens only), is one component of a comprehensive MRSA colonization surveillance program. It is not intended to diagnose MRSA infection nor to guide or monitor treatment  for MRSA infections. Performed at [facility], [place] [address], [place], [place] [address]          Radiology Studies: Ct Angio Head W Or Wo Contrast  Result Date: 07/10/2018 CLINICAL DATA:  Stroke follow-up EXAM: CT ANGIOGRAPHY HEAD AND NECK TECHNIQUE: Multidetector CT imaging of the head and neck was performed using the standard protocol during bolus administration of intravenous contrast. Multiplanar CT image reconstructions and MIPs were obtained to evaluate the vascular anatomy. Carotid stenosis measurements (when applicable) are obtained utilizing NASCET criteria, using the distal internal carotid diameter as the denominator. CONTRAST:  128mL OMNIPAQUE IOHEXOL 350 MG/ML SOLN COMPARISON:  CT head without contrast 07/09/2018 FINDINGS: CTA NECK FINDINGS Aortic arch: A 3 vessel arch configuration is present. Atherosclerotic changes are present at the aorta and origins of the great vessels without significant aneurysm or stenosis. Right carotid system: The right common carotid artery is within normal limits. Right carotid endarterectomy is noted. There is no residual recurrent stenosis. Distal cervical right ICA is normal. Left carotid system: The left common carotid artery is tortuous without significant  stenosis. Atherosclerotic changes are present at the left carotid bifurcation. There is no significant stenosis relative to the more distal vessel. The distal left ICA is normal. Vertebral arteries: The left vertebral artery is the dominant vessel. Vertebral arteries originate from the subclavian arteries bilaterally without significant stenosis. There is no significant stenosis or vascular injury to either vertebral artery in the neck. Skeleton: A chronic type 2 dens fracture is stable. Grade 1 degenerative anterolisthesis at C3-4 and C4-5 is stable. There is stable fusion at C5-6 and C6-7. No focal lytic or blastic lesions are present. The patient is edentulous. Dental implant post are noted  in the mandible. Other neck: The soft tissues the neck are otherwise unremarkable. Upper chest: Patchy ground-glass attenuation is present throughout both lungs. Review of the MIP images confirms the above findings CTA HEAD FINDINGS Anterior circulation: Atherosclerotic calcifications are present within the cavernous internal carotid arteries bilaterally. There is no significant stenosis through the ICA termini. The A1 and M1 segments are normal. MCA bifurcations are intact. There is some irregularity of distal ACA and MCA branch vessels without a significant proximal stenosis or occlusion. Posterior circulation: The left vertebral artery is the dominant vessel. PICA origins are visualized and normal. Vertebrobasilar junction is normal. Basilar artery is within normal limits. Both posterior cerebral arteries originate from the basilar tip. The PCA branch vessels are within normal limits. Venous sinuses: Dural sinuses are patent. Straight sinus and deep cerebral veins are patent. Cortical veins are unremarkable. Anatomic variants: None Delayed phase: No pathologic enhancement is present. Moderate white matter disease is noted. Review of the MIP images confirms the above findings IMPRESSION: 1. Right carotid endarterectomy is patent. 2. The large vessel occlusion. 3. Mild diffuse distal small vessel disease. 4. Atherosclerotic changes at the left carotid bifurcation without a significant stenosis. 5. Atherosclerotic changes at the aortic arch and cavernous internal carotid arteries bilaterally without a significant stenosis. 6. Chronic type 2 dens fracture. 7. Degenerative changes of the cervical spine are stable. Electronically Signed   By: San Morelle M.D.   On: 07/10/2018 04:47   Dg Chest 2 View  Result Date: 07/09/2018 CLINICAL DATA:  Fatigue and weakness EXAM: CHEST - 2 VIEW COMPARISON:  04/02/2018 FINDINGS: Cardiac shadow is mildly enlarged. Pacing device is again seen and stable. The lungs are well  aerated bilaterally. Chronic interstitial changes are noted bilaterally without focal infiltrate or effusion. No acute bony abnormality is noted. IMPRESSION: Chronic changes without acute abnormality. Electronically Signed   By: Inez Catalina M.D.   On: 07/09/2018 22:43   Ct Head Wo Contrast  Result Date: 07/10/2018 CLINICAL DATA:  83 y/o F; fatigue, weakness, loss of balance. History of stroke. EXAM: CT HEAD WITHOUT CONTRAST TECHNIQUE: Contiguous axial images were obtained from the base of the skull through the vertex without intravenous contrast. COMPARISON:  06/27/2018 CT head FINDINGS: Brain: No evidence of acute infarction, hemorrhage, hydrocephalus, extra-axial collection or mass lesion/mass effect. Stable nonspecific white matter hypodensities compatible with chronic microvascular ischemic changes. Stable volume loss of the brain. Vascular: Calcific atherosclerosis of the carotid siphons. No hyperdense vessel identified. Skull: Chronic C1 arch fractures, partially visualized. No calvarial fracture. Sinuses/Orbits: No acute finding. Other: Bilateral intra-ocular lens replacement. IMPRESSION: 1. No acute intracranial abnormality identified. 2. Stable chronic microvascular ischemic changes and volume loss of the brain. Electronically Signed   By: Kristine Garbe M.D.   On: 07/10/2018 00:00   Ct Angio Neck W Or Wo Contrast  Result Date: 07/10/2018 CLINICAL DATA:  Stroke follow-up EXAM: CT ANGIOGRAPHY HEAD AND NECK TECHNIQUE: Multidetector CT imaging of the head and neck was performed using the standard protocol during bolus administration of intravenous contrast. Multiplanar CT image reconstructions and MIPs were obtained to evaluate the vascular anatomy. Carotid stenosis measurements (when applicable) are obtained utilizing NASCET criteria, using the distal internal carotid diameter as the denominator. CONTRAST:  196mL OMNIPAQUE IOHEXOL 350 MG/ML SOLN COMPARISON:  CT head without contrast  07/09/2018 FINDINGS: CTA NECK FINDINGS Aortic arch: A 3 vessel arch configuration is present. Atherosclerotic changes are present at the aorta and origins of the great vessels without significant aneurysm or stenosis. Right carotid system: The right common carotid artery is within normal limits. Right carotid endarterectomy is noted. There is no residual recurrent stenosis. Distal cervical right ICA is normal. Left carotid system: The left common carotid artery is tortuous without significant stenosis. Atherosclerotic changes are present at the left carotid bifurcation. There is no significant stenosis relative to the more distal vessel. The distal left ICA is normal. Vertebral arteries: The left vertebral artery is the dominant vessel. Vertebral arteries originate from the subclavian arteries bilaterally without significant stenosis. There is no significant stenosis or vascular injury to either vertebral artery in the neck. Skeleton: A chronic type 2 dens fracture is stable. Grade 1 degenerative anterolisthesis at C3-4 and C4-5 is stable. There is stable fusion at C5-6 and C6-7. No focal lytic or blastic lesions are present. The patient is edentulous. Dental implant post are noted in the mandible. Other neck: The soft tissues the neck are otherwise unremarkable. Upper chest: Patchy ground-glass attenuation is present throughout both lungs. Review of the MIP images confirms the above findings CTA HEAD FINDINGS Anterior circulation: Atherosclerotic calcifications are present within the cavernous internal carotid arteries bilaterally. There is no significant stenosis through the ICA termini. The A1 and M1 segments are normal. MCA bifurcations are intact. There is some irregularity of distal ACA and MCA branch vessels without a significant proximal stenosis or occlusion. Posterior circulation: The left vertebral artery is the dominant vessel. PICA origins are visualized and normal. Vertebrobasilar junction is normal.  Basilar artery is within normal limits. Both posterior cerebral arteries originate from the basilar tip. The PCA branch vessels are within normal limits. Venous sinuses: Dural sinuses are patent. Straight sinus and deep cerebral veins are patent. Cortical veins are unremarkable. Anatomic variants: None Delayed phase: No pathologic enhancement is present. Moderate white matter disease is noted. Review of the MIP images confirms the above findings IMPRESSION: 1. Right carotid endarterectomy is patent. 2. The large vessel occlusion. 3. Mild diffuse distal small vessel disease. 4. Atherosclerotic changes at the left carotid bifurcation without a significant stenosis. 5. Atherosclerotic changes at the aortic arch and cavernous internal carotid arteries bilaterally without a significant stenosis. 6. Chronic type 2 dens fracture. 7. Degenerative changes of the cervical spine are stable. Electronically Signed   By: San Morelle M.D.   On: 07/10/2018 04:47        Scheduled Meds:  amiodarone  200 mg Oral Daily   apixaban  2.5 mg Oral BID   atorvastatin  20 mg Oral Daily   docusate sodium  100 mg Oral Daily   ferrous sulfate  325 mg Oral BID WC   gabapentin  300 mg Oral Daily   gabapentin  600 mg Oral QHS   levothyroxine  75 mcg Oral Q0600   pantoprazole  40 mg Oral Daily   vitamin B-12  1,000 mcg Oral Daily  Continuous Infusions:   LOS: 1 day    Time spent: 18min    Domenic Polite, MD [facility]  07/11/2018, 10:56 AM

## 2018-07-12 MED ORDER — ALUM & MAG HYDROXIDE-SIMETH 200-200-20 MG/5ML PO SUSP
15.0000 mL | Freq: Four times a day (QID) | ORAL | Status: DC | PRN
  Administered 2018-07-12: 18:00:00 15 mL via ORAL
  Filled 2018-07-12: qty 30

## 2018-07-12 NOTE — Progress Notes (Signed)
PROGRESS NOTE    Meredith Gonzalez  KTG:[id number] DOB: 11-May-1935 DOA: 07/09/2018 PCP: Sinclair Ship, MD  Brief Narrative: 83 year old female with history of CAD, PCI and stenting, paroxysmal atrial fibrillation on apixaban, recently hospitalized in March for possible right brain stroke, could not have an MRI due to pacer, on subsequent eval was noted to have right carotid artery stenosis underwent right CEA in March, subsequently went to inpatient rehab and then home. -Currently lives by herself, uses a walker to ambulate has had at least a couple of falls at home presented to the ED with couple of episodes of dizziness, some of these would happen when she got up from a sitting or lying position but other episodes were random sudden without any associated change in position. -In the emergency room was unremarkable, exam is nonfocal orthostatics mildly positive, CT head was unremarkable, EKG showed paced rhythm -Work-up unrevealing, currently awaiting rehab  Assessment & Plan:   1. Transient dizziness  -Posterior circulation TIA suspected, orthostatic hypotension also contributing but symptomatology not consistent with this throughout as some episodes are random  -CT head unremarkable, cannot have MRI due to pacemaker -Appreciate neurology consult, recommended to continue Eliquis for atrial fibrillation -Further work-up recommended since she had an extensive stroke work-up last month -Patient likely has autonomic neuropathy due to age or possibly following CEA -supportive care, advised hydration, TED stockings -Pt eval completed, SNF recommended -social worker consulted  2. Elevated BP -Given mildly poor positive orthostatics and symptoms we will not add BP meds, tolerate higher supine BP  3. P.A. fib and a history of sinus node dysfunction status post pacemaker placement - -continue amiodarone and apixaban  4. CAD  -stable  5. Hypothyroidism -continue synthroid.  6. Recent carotid  endarterectomy  - after admission for stroke in March 2020.  7. History of diastolic CHF  -Euvolemic at this time, not on diuretics at baseline   DVT prophylaxis: Apixaban. Code Status: DNR. Family Communication: Discussed with patient. Disposition Plan: SNF when bed available.  Consults : Neurology.    Procedures:   Antimicrobials:    Subjective: -feels ok, dizziness improving  Objective: Vitals:   07/11/18 2012 07/11/18 2331 07/12/18 0405 07/12/18 0809  BP: (!) 153/68 136/60 (!) 150/73 (!) 163/70  Pulse: 80 70 78 69  Resp: 17 16 17 18   Temp: 98.2 F (36.8 C) 97.8 F (36.6 C) 98 F (36.7 C) 98 F (36.7 C)  TempSrc: Oral Oral Oral Oral  SpO2: 100% (!) 84% 96% 98%  Weight:      Height:       No intake or output data in the 24 hours ending 07/12/18 1311 Filed Weights   07/09/18 2059  Weight: 55.3 kg    Examination:  Gen: Awake, Alert, Oriented X 3, no distress HEENT: PERRLA, Neck supple, no JVD Lungs: Good air movement bilaterally, CTAB CVS: RRR,No Gallops,Rubs or new Murmurs Abd: soft, Non tender, non distended, BS present Extremities: No edema Skin: no new rashes Psychiatry:  Mood & affect appropriate.     Data Reviewed:   CBC: Recent Labs  Lab 07/09/18 2129 07/10/18 0324  WBC 8.2 8.5  NEUTROABS 6.5  --   HGB 12.1 10.7*  HCT 39.4 35.5*  MCV 106.5* 105.3*  PLT 324 428   Basic Metabolic Panel: Recent Labs  Lab 07/09/18 2129 07/10/18 0324  NA 136 138  K 4.1 3.9  CL 110 111  CO2 20* 21*  GLUCOSE 115* 110*  BUN 22 16  CREATININE 0.69  0.64  CALCIUM 8.5* 8.2*   GFR: Estimated Creatinine Clearance: 49 mL/min (by C-G formula based on SCr of 0.64 mg/dL). Liver Function Tests: Recent Labs  Lab 07/10/18 0324  AST 24  ALT 21  ALKPHOS 152*  BILITOT 0.3  PROT 6.2*  ALBUMIN 2.7*   No results for input(s): LIPASE, AMYLASE in the last 168 hours. No results for input(s): AMMONIA in the last 168 hours. Coagulation Profile: No  results for input(s): INR, PROTIME in the last 168 hours. Cardiac Enzymes: Recent Labs  Lab 07/09/18 2129  TROPONINI <0.03   BNP (last 3 results) No results for input(s): PROBNP in the last 8760 hours. HbA1C: Recent Labs    07/10/18 0324  HGBA1C 5.1   CBG: No results for input(s): GLUCAP in the last 168 hours. Lipid Profile: Recent Labs    07/10/18 0324  CHOL 88  HDL 52  LDLCALC 31  TRIG 26  CHOLHDL 1.7   Thyroid Function Tests: No results for input(s): TSH, T4TOTAL, FREET4, T3FREE, THYROIDAB in the last 72 hours. Anemia Panel: No results for input(s): VITAMINB12, FOLATE, FERRITIN, TIBC, IRON, RETICCTPCT in the last 72 hours. Urine analysis:    Component Value Date/Time   COLORURINE YELLOW 07/09/2018 2234   APPEARANCEUR CLEAR 07/09/2018 2234   LABSPEC 1.020 07/09/2018 2234   PHURINE 6.5 07/09/2018 [place] 07/09/2018 [place] 07/09/2018 2234   BILIRUBINUR NEGATIVE 07/09/2018 2234   KETONESUR NEGATIVE 07/09/2018 2234   PROTEINUR NEGATIVE 07/09/2018 2234   NITRITE NEGATIVE 07/09/2018 2234   LEUKOCYTESUR TRACE (A) 07/09/2018 2234   Sepsis Labs: @LABRCNTIP (procalcitonin:4,lacticidven:4)  ) Recent Results (from the past 240 hour(s))  MRSA PCR Screening     Status: None   Collection Time: 07/10/18  4:45 PM  Result Value Ref Range Status   MRSA by PCR NEGATIVE NEGATIVE Final    Comment:        The GeneXpert MRSA Assay (FDA approved for NASAL specimens only), is one component of a comprehensive MRSA colonization surveillance program. It is not intended to diagnose MRSA infection nor to guide or monitor treatment for MRSA infections. Performed at [facility], [place] [address], [place], [place] [address]          Radiology Studies: No results found.      Scheduled Meds: . amiodarone  200 mg Oral Daily  . apixaban  2.5 mg Oral BID  . atorvastatin  20 mg Oral Daily  . docusate sodium  100 mg Oral Daily  .  ferrous sulfate  325 mg Oral BID WC  . gabapentin  300 mg Oral Daily  . gabapentin  600 mg Oral QHS  . levothyroxine  75 mcg Oral Q0600  . pantoprazole  40 mg Oral Daily  . vitamin B-12  1,000 mcg Oral Daily   Continuous Infusions:   LOS: 2 days    Time spent: 83min    Domenic Polite, MD [facility]  07/12/2018, 1:11 PM

## 2018-07-13 ENCOUNTER — Ambulatory Visit: Payer: Medicare Other | Admitting: Internal Medicine

## 2018-07-13 DIAGNOSIS — Z888 Allergy status to other drugs, medicaments and biological substances status: Secondary | ICD-10-CM

## 2018-07-13 DIAGNOSIS — Z885 Allergy status to narcotic agent status: Secondary | ICD-10-CM

## 2018-07-13 MED ORDER — APIXABAN 2.5 MG PO TABS: 2.5000 mg | ORAL_TABLET | Freq: Two times a day (BID) | ORAL | Status: AC

## 2018-07-13 NOTE — Plan of Care (Signed)
Adequate for discharge.

## 2018-07-13 NOTE — Discharge Summary (Signed)
Physician Discharge Summary  Meredith Gonzalez BPZ:[id number] DOB: 1935/05/18 DOA: 07/09/2018  PCP: Sinclair Ship, MD  Admit date: 07/09/2018 Discharge date: 07/13/2018  Time spent: 35 minutes  Recommendations for Outpatient Follow-up:  1. PCP Dr. Tamala Julian in 1 week 2. Cardiology Dr. Dorris Carnes in 1 month, please note we have decreased her Eliquis dose per Pharm.D. 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 -Currently lives by herself, uses a walker to ambulate has had at least a couple of falls at home presented to the ED with couple of episodes of dizziness, some of these would happen when she got up from a sitting or lying position but other episodes were random sudden without any associated change in position. -In the emergency room was unremarkable, exam is nonfocal orthostatics mildly positive, CT head was unremarkable, EKG showed paced rhythm  Hospital Course:   1. Transient dizziness  -Posterior circulation TIA suspected, orthostatic hypotension also contributing but symptomatology not consistent with this throughout as some episodes were consistent with orthostatic changes while others were random  -CT head unremarkable, cannot have MRI due  to pacemaker -Appreciate neurology consult, recommended to continue Eliquis for atrial fibrillation -Eliquis dose lowered to 2.5 mg twice daily after taking her age, wt and GFR into account -Further work-up recommended since she had an extensive stroke work-up last month -Patient likely has autonomic neuropathy due to age or possibly following CEA -supportive care, advised hydration, TED stockings -Also patient and family educated about the need to tolerate higher supine blood pressure due to orthostatic hypotension -Pt eval completed, SNF recommended -social worker consulted  2. Elevated BP -Given mildly poor positive orthostatics and symptoms we will not add BP meds, tolerate higher supine BP  3. P.A. fib and a history of sinus node dysfunction status post pacemaker placement- -continue amiodarone and apixaban  4. CAD  -stable  5. Hypothyroidism -continue synthroid.  6. Recent carotid endarterectomy  - after admission for stroke in March 2020.  7. History of diastolic CHF  -Euvolemic at this time, not on diuretics at baseline   DVT prophylaxis:Apixaban. Code Status:DNR.  Discharge Exam: Vitals:   07/13/18 0519 07/13/18 0900  BP: (!) 146/70 (!) 154/71  Pulse: 77 70  Resp: 16   Temp: 98.2 F (36.8 C) 97.7 F (36.5 C)  SpO2: 96% 99%    General: AAOx3 Cardiovascular: S1S2/Irregular Respiratory: CTAB  Discharge Instructions   Discharge Instructions    Diet - low sodium heart healthy   Complete by:  As directed    Increase activity slowly   Complete by:  As directed      Allergies as of 07/13/2018      Reactions   Valium [diazepam] Anxiety   Makes patient hyper   Robaxin [methocarbamol] Other (See  Comments)   Makes pt loopy/acts drunk   Tramadol Other (See Comments)   sedation   Codeine Hives, Itching   Can take with Benadryl   Darifenacin Itching   Can take with Benadryl   Darvon [propoxyphene Hcl] Itching   Can take with Benadryl    Daypro [oxaprozin] Itching   Can take with Benadryl   Enablex [darifenacin Hydrobromide Er] Itching   Can take with Benadryl   Oxycodone Itching   Can take with Benadryl   Propoxyphene Itching   Can take with Benadryl   Risperdal [risperidone] Itching   Can take with Benadryl   Talwin [pentazocine] Itching   Can take with Benadryl   Vicodin [hydrocodone-acetaminophen] Itching   Can take with Benadryl      Medication List    STOP taking these medications   sertraline 50 MG tablet Commonly known as:  ZOLOFT     TAKE these medications   acetaminophen 500 MG tablet Commonly known as:  TYLENOL Take 500 mg by mouth every 6 (six) hours as needed for moderate pain.   albuterol 108 (90 Base) MCG/ACT inhaler Commonly known as:  VENTOLIN HFA Inhale 2 puffs into the lungs every 6 (six) hours as needed for wheezing or shortness of breath.   amiodarone 200 MG tablet Commonly known as:  PACERONE Take 1 tablet (200 mg total) by mouth every morning. What changed:  when to take this   apixaban 2.5 MG Tabs tablet Commonly known as:  ELIQUIS Take 1 tablet (2.5 mg total) by mouth 2 (two) times daily. What changed:    medication strength  how much to take   atorvastatin 20 MG tablet Commonly known as:  LIPITOR Take 1 tablet (20 mg total) by mouth daily for 30 days.   budesonide-formoterol 160-4.5 MCG/ACT inhaler Commonly known as:  Symbicort Inhale 2 puffs into the lungs 2 (two) times daily.   docusate sodium 100 MG capsule Commonly known as:  COLACE Take 1 capsule (100 mg total) by mouth daily.   ferrous sulfate 325 (65 FE) MG tablet Take 1 tablet (325 mg total) by mouth 2 (two) times daily with a meal.   flintstones complete 60 MG chewable tablet Chew 1 tablet by mouth daily.   gabapentin 300 MG capsule Commonly known as:  NEURONTIN Take 1-2 capsules (300-600 mg total) by mouth See admin instructions. Take 1 capsule (300 mg) by mouth in the mornning & take 2 capsules  (600 mg) by mouth at night What changed:  additional instructions   levothyroxine 75 MCG tablet Commonly known as:  SYNTHROID Take 1 tablet (75 mcg total) by mouth daily at 6 (six) AM.   nitroGLYCERIN 0.4 MG SL tablet Commonly known as:  NITROSTAT Place 1 tablet (0.4 mg total) under the tongue every 5 (five) minutes as needed for chest pain.   pantoprazole 40 MG tablet Commonly known as:  PROTONIX Take 1 tablet (40 mg total) by mouth daily.   vitamin B-12 1000 MCG tablet Commonly known as:  CYANOCOBALAMIN Take 1 tablet (1,000 mcg total) by mouth daily.      Allergies  Allergen Reactions  . Valium [Diazepam] Anxiety    Makes patient hyper  . Robaxin [Methocarbamol] Other (See Comments)    Makes pt loopy/acts drunk  . Tramadol Other (See Comments)    sedation  . Codeine Hives and Itching    Can take with Benadryl   . Darifenacin Itching    Can take with Benadryl  . Darvon [Propoxyphene Hcl]  Itching    Can take with Benadryl  . Daypro [Oxaprozin] Itching    Can take with Benadryl  . Enablex [Darifenacin Hydrobromide Er] Itching    Can take with Benadryl  . Oxycodone Itching    Can take with Benadryl   . Propoxyphene Itching    Can take with Benadryl  . Risperdal [Risperidone] Itching    Can take with Benadryl  . Talwin [Pentazocine] Itching    Can take with Benadryl  . Vicodin [Hydrocodone-Acetaminophen] Itching    Can take with Benadryl    Follow-up Information    Sinclair Ship, MD. Schedule an appointment as soon as possible for a visit in 1 week(s).   Specialty:  Internal Medicine Contact information: [address] [place] [address] [phone]        Fay Records, MD .   Specialty:  Cardiology Contact information: [place] [address] [place] [address] [phone]        Evans Lance, MD .   Specialty:  Cardiology Contact information: [phone] [place]  [address] [phone]            The results of significant diagnostics from this hospitalization (including imaging, microbiology, ancillary and laboratory) are listed below for reference.    Significant Diagnostic Studies: Ct Angio Head W Or Wo Contrast  Result Date: 07/10/2018 CLINICAL DATA:  Stroke follow-up EXAM: CT ANGIOGRAPHY HEAD AND NECK TECHNIQUE: Multidetector CT imaging of the head and neck was performed using the standard protocol during bolus administration of intravenous contrast. Multiplanar CT image reconstructions and MIPs were obtained to evaluate the vascular anatomy. Carotid stenosis measurements (when applicable) are obtained utilizing NASCET criteria, using the distal internal carotid diameter as the denominator. CONTRAST:  194mL OMNIPAQUE IOHEXOL 350 MG/ML SOLN COMPARISON:  CT head without contrast 07/09/2018 FINDINGS: CTA NECK FINDINGS Aortic arch: A 3 vessel arch configuration is present. Atherosclerotic changes are present at the aorta and origins of the great vessels without significant aneurysm or stenosis. Right carotid system: The right common carotid artery is within normal limits. Right carotid endarterectomy is noted. There is no residual recurrent stenosis. Distal cervical right ICA is normal. Left carotid system: The left common carotid artery is tortuous without significant stenosis. Atherosclerotic changes are present at the left carotid bifurcation. There is no significant stenosis relative to the more distal vessel. The distal left ICA is normal. Vertebral arteries: The left vertebral artery is the dominant vessel. Vertebral arteries originate from the subclavian arteries bilaterally without significant stenosis. There is no significant stenosis or vascular injury to either vertebral artery in the neck. Skeleton: A chronic type 2 dens fracture is stable. Grade 1 degenerative anterolisthesis at C3-4 and C4-5 is stable. There is stable fusion at C5-6 and C6-7. No  focal lytic or blastic lesions are present. The patient is edentulous. Dental implant post are noted in the mandible. Other neck: The soft tissues the neck are otherwise unremarkable. Upper chest: Patchy ground-glass attenuation is present throughout both lungs. Review of the MIP images confirms the above findings CTA HEAD FINDINGS Anterior circulation: Atherosclerotic calcifications are present within the cavernous internal carotid arteries bilaterally. There is no significant stenosis through the ICA termini. The A1 and M1 segments are normal. MCA bifurcations are intact. There is some irregularity of distal ACA and MCA branch vessels without a significant proximal stenosis or occlusion. Posterior circulation: The left vertebral artery is the dominant vessel. PICA origins are visualized  and normal. Vertebrobasilar junction is normal. Basilar artery is within normal limits. Both posterior cerebral arteries originate from the basilar tip. The PCA branch vessels are within normal limits. Venous sinuses: Dural sinuses are patent. Straight sinus and deep cerebral veins are patent. Cortical veins are unremarkable. Anatomic variants: None Delayed phase: No pathologic enhancement is present. Moderate white matter disease is noted. Review of the MIP images confirms the above findings IMPRESSION: 1. Right carotid endarterectomy is patent. 2. The large vessel occlusion. 3. Mild diffuse distal small vessel disease. 4. Atherosclerotic changes at the left carotid bifurcation without a significant stenosis. 5. Atherosclerotic changes at the aortic arch and cavernous internal carotid arteries bilaterally without a significant stenosis. 6. Chronic type 2 dens fracture. 7. Degenerative changes of the cervical spine are stable. Electronically Signed   By: San Morelle M.D.   On: 07/10/2018 04:47   Dg Chest 2 View  Result Date: 07/09/2018 CLINICAL DATA:  Fatigue and weakness EXAM: CHEST - 2 VIEW COMPARISON:  04/02/2018  FINDINGS: Cardiac shadow is mildly enlarged. Pacing device is again seen and stable. The lungs are well aerated bilaterally. Chronic interstitial changes are noted bilaterally without focal infiltrate or effusion. No acute bony abnormality is noted. IMPRESSION: Chronic changes without acute abnormality. Electronically Signed   By: Inez Catalina M.D.   On: 07/09/2018 22:43   Dg Cervical Spine Complete  Result Date: 06/17/2018 CLINICAL DATA:  Occipital pain. EXAM: CERVICAL SPINE - COMPLETE 4+ VIEW COMPARISON:  CT scan of May 30, 2018. FINDINGS: Findings consistent with old odontoid fracture as seen on prior CT scan. Minimal grade 1 anterolisthesis of C3-4 and C4-5 is noted secondary to posterior facet joint hypertrophy. Fusion of C5-6 and C6-7 is noted which most likely is degenerative in etiology. Degenerative changes are seen involving posterior facet joints bilaterally. No acute fracture is noted. No definite neural foraminal stenosis is noted. No prevertebral soft tissue swelling is noted. IMPRESSION: Findings consistent with old odontoid fracture as seen on prior CT scan. Degenerative changes are noted throughout the cervical spine as described above. No definite acute abnormality is noted. Electronically Signed   By: Marijo Conception, M.D.   On: 06/17/2018 09:58   Dg Lumbar Spine Complete  Result Date: 06/27/2018 CLINICAL DATA:  Fall yesterday with low back pain. EXAM: LUMBAR SPINE - COMPLETE 4+ VIEW COMPARISON:  08/04/2017 FINDINGS: Vertebral body alignment is within normal. There is mild to moderate spondylosis of the lumbar spine to include facet arthropathy over the lower lumbar spine. Moderate compression fracture of T12 unchanged. Mild compression fractures of L3 and L4 unchanged. Slight interval worsening depression of the superior endplate of L2 with cortical step-off involving the anterior L2 vertebral body cortex likely acute injury new mild compression deformity of L1 likely acute. Moderate disc  space narrowing at the L5-S1 level. Remainder of the exam is unchanged. IMPRESSION: Mild compression fracture of L1 as well as depression of the superior endplate of L2 with cortical step-off along the anterior cortex of the L2 vertebral body as these findings likely represent acute fractures. Moderate spondylosis of the lumbar spine with disc disease at the L5-S1 level. Stable chronic compression fractures of T12, L3 and L4. Electronically Signed   By: Marin Olp M.D.   On: 06/27/2018 17:10   Ct Head Wo Contrast  Result Date: 07/10/2018 CLINICAL DATA:  83 y/o F; fatigue, weakness, loss of balance. History of stroke. EXAM: CT HEAD WITHOUT CONTRAST TECHNIQUE: Contiguous axial images were obtained from the base  of the skull through the vertex without intravenous contrast. COMPARISON:  06/27/2018 CT head FINDINGS: Brain: No evidence of acute infarction, hemorrhage, hydrocephalus, extra-axial collection or mass lesion/mass effect. Stable nonspecific white matter hypodensities compatible with chronic microvascular ischemic changes. Stable volume loss of the brain. Vascular: Calcific atherosclerosis of the carotid siphons. No hyperdense vessel identified. Skull: Chronic C1 arch fractures, partially visualized. No calvarial fracture. Sinuses/Orbits: No acute finding. Other: Bilateral intra-ocular lens replacement. IMPRESSION: 1. No acute intracranial abnormality identified. 2. Stable chronic microvascular ischemic changes and volume loss of the brain. Electronically Signed   By: Kristine Garbe M.D.   On: 07/10/2018 00:00   Ct Head Wo Contrast  Result Date: 06/27/2018 CLINICAL DATA:  Status post fall.  No head trauma.  No headache. EXAM: CT HEAD WITHOUT CONTRAST CT CERVICAL SPINE WITHOUT CONTRAST TECHNIQUE: Multidetector CT imaging of the head and cervical spine was performed following the standard protocol without intravenous contrast. Multiplanar CT image reconstructions of the cervical spine were also  generated. COMPARISON:  12/28/2017 FINDINGS: CT HEAD FINDINGS Brain: No evidence of acute infarction, hemorrhage, extra-axial collection, ventriculomegaly, or mass effect. Generalized cerebral atrophy. Periventricular white matter low attenuation likely secondary to microangiopathy. Vascular: Cerebrovascular atherosclerotic calcifications are noted. Skull: Negative for fracture or focal lesion. Sinuses/Orbits: Visualized portions of the orbits are unremarkable. Visualized portions of the paranasal sinuses and mastoid air cells are unremarkable. Other: None. CT CERVICAL SPINE FINDINGS Alignment: 1-2 mm anterolisthesis of C3 on C4. Skull base and vertebrae: No acute fracture. Ununited type 2 dens fracture. Ununited anterior and posterior arch fracture of C1. Soft tissues and spinal canal: No prevertebral fluid or swelling. No visible canal hematoma. Disc levels: Anterior cervical fusion at C5-6 and C6-7. Mild disc height loss at C7-T1. Bilateral uncovertebral degenerative changes and facet arthropathy at C3-4 with bilateral foraminal stenosis. Moderate left facet arthropathy at C4-5. Moderate bilateral facet arthropathy at C7-T1. Upper chest: Lung apices are clear. Other: No fluid collection or hematoma. IMPRESSION: 1. No acute intracranial pathology. 2. Stable ununited type 2 dens fracture. Stable anterior and posterior C1 arch fractures. 3. No new cervical injury. Electronically Signed   By: Kathreen Devoid   On: 06/27/2018 16:41   Ct Head Wo Contrast  Result Date: 06/17/2018 CLINICAL DATA:  Bilateral occipital headaches radiating to frontal regions EXAM: CT HEAD WITHOUT CONTRAST TECHNIQUE: Contiguous axial images were obtained from the base of the skull through the vertex without intravenous contrast. COMPARISON:  May 31, 2018 FINDINGS: Brain: Mild diffuse atrophy is stable. There is no intracranial mass, hemorrhage, extra-axial fluid collection, or midline shift. There is small vessel disease in the centra  semiovale bilaterally, stable. Small vessel disease is also noted in each lateral thalamus and in the anterior limbs of the internal and external capsules bilaterally, stable. No acute appearing infarct is evident on this study. Vascular: There is no appreciable hyperdense vessel. There is calcification in each carotid siphon region. Skull: The bony calvarium appears intact. Sinuses/Orbits: There is mucosal thickening in several ethmoid air cells. Other visualized paranasal sinuses are clear. Visualized orbits appear symmetric bilaterally. Other: Mastoid air cells are clear. IMPRESSION: Stable atrophy with supratentorial small vessel disease. No acute infarct evident. No mass or hemorrhage. There are foci of arterial vascular calcification. There is mucosal thickening in several ethmoid air cells. Electronically Signed   By: Lowella Grip III M.D.   On: 06/17/2018 07:15   Ct Angio Neck W Or Wo Contrast  Result Date: 07/10/2018 CLINICAL DATA:  Stroke follow-up  EXAM: CT ANGIOGRAPHY HEAD AND NECK TECHNIQUE: Multidetector CT imaging of the head and neck was performed using the standard protocol during bolus administration of intravenous contrast. Multiplanar CT image reconstructions and MIPs were obtained to evaluate the vascular anatomy. Carotid stenosis measurements (when applicable) are obtained utilizing NASCET criteria, using the distal internal carotid diameter as the denominator. CONTRAST:  173mL OMNIPAQUE IOHEXOL 350 MG/ML SOLN COMPARISON:  CT head without contrast 07/09/2018 FINDINGS: CTA NECK FINDINGS Aortic arch: A 3 vessel arch configuration is present. Atherosclerotic changes are present at the aorta and origins of the great vessels without significant aneurysm or stenosis. Right carotid system: The right common carotid artery is within normal limits. Right carotid endarterectomy is noted. There is no residual recurrent stenosis. Distal cervical right ICA is normal. Left carotid system: The left  common carotid artery is tortuous without significant stenosis. Atherosclerotic changes are present at the left carotid bifurcation. There is no significant stenosis relative to the more distal vessel. The distal left ICA is normal. Vertebral arteries: The left vertebral artery is the dominant vessel. Vertebral arteries originate from the subclavian arteries bilaterally without significant stenosis. There is no significant stenosis or vascular injury to either vertebral artery in the neck. Skeleton: A chronic type 2 dens fracture is stable. Grade 1 degenerative anterolisthesis at C3-4 and C4-5 is stable. There is stable fusion at C5-6 and C6-7. No focal lytic or blastic lesions are present. The patient is edentulous. Dental implant post are noted in the mandible. Other neck: The soft tissues the neck are otherwise unremarkable. Upper chest: Patchy ground-glass attenuation is present throughout both lungs. Review of the MIP images confirms the above findings CTA HEAD FINDINGS Anterior circulation: Atherosclerotic calcifications are present within the cavernous internal carotid arteries bilaterally. There is no significant stenosis through the ICA termini. The A1 and M1 segments are normal. MCA bifurcations are intact. There is some irregularity of distal ACA and MCA branch vessels without a significant proximal stenosis or occlusion. Posterior circulation: The left vertebral artery is the dominant vessel. PICA origins are visualized and normal. Vertebrobasilar junction is normal. Basilar artery is within normal limits. Both posterior cerebral arteries originate from the basilar tip. The PCA branch vessels are within normal limits. Venous sinuses: Dural sinuses are patent. Straight sinus and deep cerebral veins are patent. Cortical veins are unremarkable. Anatomic variants: None Delayed phase: No pathologic enhancement is present. Moderate white matter disease is noted. Review of the MIP images confirms the above  findings IMPRESSION: 1. Right carotid endarterectomy is patent. 2. The large vessel occlusion. 3. Mild diffuse distal small vessel disease. 4. Atherosclerotic changes at the left carotid bifurcation without a significant stenosis. 5. Atherosclerotic changes at the aortic arch and cavernous internal carotid arteries bilaterally without a significant stenosis. 6. Chronic type 2 dens fracture. 7. Degenerative changes of the cervical spine are stable. Electronically Signed   By: San Morelle M.D.   On: 07/10/2018 04:47   Ct Cervical Spine Wo Contrast  Result Date: 06/27/2018 CLINICAL DATA:  Status post fall.  No head trauma.  No headache. EXAM: CT HEAD WITHOUT CONTRAST CT CERVICAL SPINE WITHOUT CONTRAST TECHNIQUE: Multidetector CT imaging of the head and cervical spine was performed following the standard protocol without intravenous contrast. Multiplanar CT image reconstructions of the cervical spine were also generated. COMPARISON:  12/28/2017 FINDINGS: CT HEAD FINDINGS Brain: No evidence of acute infarction, hemorrhage, extra-axial collection, ventriculomegaly, or mass effect. Generalized cerebral atrophy. Periventricular white matter low attenuation likely secondary to microangiopathy.  Vascular: Cerebrovascular atherosclerotic calcifications are noted. Skull: Negative for fracture or focal lesion. Sinuses/Orbits: Visualized portions of the orbits are unremarkable. Visualized portions of the paranasal sinuses and mastoid air cells are unremarkable. Other: None. CT CERVICAL SPINE FINDINGS Alignment: 1-2 mm anterolisthesis of C3 on C4. Skull base and vertebrae: No acute fracture. Ununited type 2 dens fracture. Ununited anterior and posterior arch fracture of C1. Soft tissues and spinal canal: No prevertebral fluid or swelling. No visible canal hematoma. Disc levels: Anterior cervical fusion at C5-6 and C6-7. Mild disc height loss at C7-T1. Bilateral uncovertebral degenerative changes and facet arthropathy at  C3-4 with bilateral foraminal stenosis. Moderate left facet arthropathy at C4-5. Moderate bilateral facet arthropathy at C7-T1. Upper chest: Lung apices are clear. Other: No fluid collection or hematoma. IMPRESSION: 1. No acute intracranial pathology. 2. Stable ununited type 2 dens fracture. Stable anterior and posterior C1 arch fractures. 3. No new cervical injury. Electronically Signed   By: Kathreen Devoid   On: 06/27/2018 16:41   Dg Hip Unilat With Pelvis 2-3 Views Left  Result Date: 06/27/2018 CLINICAL DATA:  Fall yesterday with left hip pain. EXAM: DG HIP (WITH OR WITHOUT PELVIS) 2-3V LEFT COMPARISON:  Lumbar spine 08/04/2017 FINDINGS: Mild diffuse osteopenia. Mild symmetric degenerative change of the hips. Hardware over both femurs intact. Old right superior pubic ramus fracture unchanged. Irregularity over the inferior pubic rami bilaterally suggesting chronic fractures, although indeterminate. Degenerative change of the spine. IMPRESSION: No definite acute hip fracture. Hardware over the bilateral femurs intact. Irregularity of the inferior pubic rami bilaterally likely due to old fractures, although acute fractures possible. Old right superior pubic ramus fracture. Electronically Signed   By: Marin Olp M.D.   On: 06/27/2018 17:14    Microbiology: Recent Results (from the past 240 hour(s))  MRSA PCR Screening     Status: None   Collection Time: 07/10/18  4:45 PM  Result Value Ref Range Status   MRSA by PCR NEGATIVE NEGATIVE Final    Comment:        The GeneXpert MRSA Assay (FDA approved for NASAL specimens only), is one component of a comprehensive MRSA colonization surveillance program. It is not intended to diagnose MRSA infection nor to guide or monitor treatment for MRSA infections. Performed at [facility], [place] [address], [place], [place] [address]      Labs: Basic Metabolic Panel: Recent Labs  Lab 07/09/18 2129 07/10/18 0324  NA 136 138  K 4.1 3.9  CL 110 111   CO2 20* 21*  GLUCOSE 115* 110*  BUN 22 16  CREATININE 0.69 0.64  CALCIUM 8.5* 8.2*   Liver Function Tests: Recent Labs  Lab 07/10/18 0324  AST 24  ALT 21  ALKPHOS 152*  BILITOT 0.3  PROT 6.2*  ALBUMIN 2.7*   No results for input(s): LIPASE, AMYLASE in the last 168 hours. No results for input(s): AMMONIA in the last 168 hours. CBC: Recent Labs  Lab 07/09/18 2129 07/10/18 0324  WBC 8.2 8.5  NEUTROABS 6.5  --   HGB 12.1 10.7*  HCT 39.4 35.5*  MCV 106.5* 105.3*  PLT 324 295   Cardiac Enzymes: Recent Labs  Lab 07/09/18 2129  TROPONINI <0.03   BNP: BNP (last 3 results) Recent Labs    02/10/18 1519 07/09/18 2129  BNP 550.4* 135.1*    ProBNP (last 3 results) No results for input(s): PROBNP in the last 8760 hours.  CBG: No results for input(s): GLUCAP in the last 168 hours.  Signed:  Domenic Polite MD.  [facility] 07/13/2018, 11:16 AM

## 2018-07-13 NOTE — Progress Notes (Signed)
Occupational Therapy Treatment Patient Details Name: Meredith Gonzalez MRN: [id number] DOB: [birth year] Today's Date: 07/13/2018    History of present illness 83 y/o female admitted with dizziness, left hip painful since fall in April of this year. CT negative this admission, no MRI due to pacemaker. PMH significant for recent stroke, DM, HTN, pacemaker, CHF, cervical fracture, femur fracture, recurrent falls. S/p right CEA.    OT comments  Patient progressing slowly.  Completing transfers and mobility using RW with min guard today, reliant on support when grooming at sink leaning forward at hips to support self.  Patient requires min assist for LB ADLs, reporting using reacher/sock aide for dressing at home.  Continue to recommend SNF rehab for strength, activity tolerance and balance progression before dc home.  Will follow.    Follow Up Recommendations  SNF;Supervision/Assistance - 24 hour    Equipment Recommendations  None recommended by OT    Recommendations for Other Services      Precautions / Restrictions Precautions Precautions: Fall Precaution Comments: many falls with Sept neck fracture, April 2020 hurt left hip with indeterminate age of L-spine fracutures Restrictions Weight Bearing Restrictions: No       Mobility Bed Mobility               General bed mobility comments: OOB upon entry   Transfers Overall transfer level: Needs assistance Equipment used: Rolling walker (2 wheeled) Transfers: Sit to/from Stand Sit to Stand: Min guard         General transfer comment: min guard for safety and balance     Balance Overall balance assessment: Needs assistance Sitting-balance support: No upper extremity supported;Feet supported Sitting balance-Leahy Scale: Fair     Standing balance support: Bilateral upper extremity supported;During functional activity Standing balance-Leahy Scale: Poor Standing balance comment: relaint on support                           ADL either performed or assessed with clinical judgement   ADL Overall ADL's : Needs assistance/impaired     Grooming: Min guard;Standing;Wash/dry hands       Lower Body Bathing: Minimal assistance;Sit to/from stand Lower Body Bathing Details (indicate cue type and reason): min guard sit to stand, uses sock aide and reacher at home for LB dressing in sitting          Toilet Transfer: Min guard;Ambulation;RW Toilet Transfer Details (indicate cue type and reason): simulated in room         Functional mobility during ADLs: Min guard;Rolling walker General ADL Comments: pt limited by generalized weakness and balance      Vision       Perception     Praxis      Cognition Arousal/Alertness: Awake/alert Behavior During Therapy: WFL for tasks assessed/performed Overall Cognitive Status: Within Functional Limits for tasks assessed                                          Exercises     Shoulder Instructions       General Comments      Pertinent Vitals/ Pain       Pain Assessment: Faces Faces Pain Scale: Hurts even more Pain Location: Left hip and back 2 recent Lumbar compression fracture  Pain Descriptors / Indicators: Aching;Sore Pain Intervention(s): Monitored during session;Repositioned  Home Living  Prior Functioning/Environment              Frequency  Min 3X/week        Progress Toward Goals  OT Goals(current goals can now be found in the care plan section)  Progress towards OT goals: Progressing toward goals  Acute Rehab OT Goals Patient Stated Goal: to get to rehab OT Goal Formulation: With patient Time For Goal Achievement: 07/24/18 Potential to Achieve Goals: Good  Plan Discharge plan remains appropriate;Frequency remains appropriate    Co-evaluation                 AM-PAC OT "6 Clicks" Daily Activity     Outcome Measure   Help from  another person eating meals?: None Help from another person taking care of personal grooming?: A Little Help from another person toileting, which includes using toliet, bedpan, or urinal?: A Little Help from another person bathing (including washing, rinsing, drying)?: A Little Help from another person to put on and taking off regular upper body clothing?: None Help from another person to put on and taking off regular lower body clothing?: A Little 6 Click Score: 20    End of Session Equipment Utilized During Treatment: Rolling walker  OT Visit Diagnosis: Unsteadiness on feet (R26.81);Other abnormalities of gait and mobility (R26.89);Pain Pain - Right/Left: Left Pain - part of body: Hip   Activity Tolerance Patient tolerated treatment well   Patient Left in chair;with call bell/phone within reach;with chair alarm set   Nurse Communication Mobility status        Time: 0102-7253 OT Time Calculation (min): 16 min  Charges: OT General Charges $OT Visit: 1 Visit OT Treatments $Self Care/Home Management : 8-22 mins  Delight Stare, [place] Pager [phone] Office [phone]    Delight Stare 07/13/2018, 1:15 PM

## 2018-07-13 NOTE — TOC Transition Note (Signed)
Transition of Care [facility]) - CM/SW Discharge Note   Patient Details  Name: Shiara Mcgough MRN: [id number] Date of Birth: June 23, 1935  Transition of Care [facility]) CM/SW Contact:  Geralynn Ochs, LCSW Phone Number: 07/13/2018, 11:47 AM   Clinical Narrative:   Nurse to call report to [phone], Room 609A    Final next level of care: Skilled Nursing Facility Barriers to Discharge: No Barriers Identified   Patient Goals and CMS Choice        Discharge Placement              Patient chooses bed at: [facility] Patient to be transferred to facility by: [facility] Name of family member notified: Lennette Bihari Patient and family notified of of transfer: 07/13/18  Discharge Plan and Services In-house Referral: Clinical Social Work                        Social Determinants of Health (SDOH) Interventions     Readmission Risk Interventions No flowsheet data found.

## 2018-07-16 ENCOUNTER — Telehealth: Payer: Self-pay | Admitting: Internal Medicine

## 2018-07-16 NOTE — Telephone Encounter (Signed)
Pt's son Lennette Bihari called back to inform this office pt is in a nursing\ home at this time for maybe the next 2-3 weeks.  They back to reschedule her appointment.

## 2018-07-16 NOTE — Telephone Encounter (Signed)
Will forward to Dr / Nurse to arrange a 1 Month f/u per hospital DC summary  from 07/13/2018, Note: eliquis was reduced per Pharm D note.

## 2018-07-16 NOTE — Telephone Encounter (Signed)
New Message           Meredith Gonzalez  is calling to get an appointment for  post hospital and Afib. For 1 month out from 07/13/18. Pls call [phone] ask for the nurse to get a Virtual or phone visit.

## 2018-07-16 NOTE — Telephone Encounter (Signed)
Set appt with me in end of may , early june

## 2018-08-04 NOTE — Telephone Encounter (Signed)
Spoke with Production assistant, radio at [employer] [phone]. Arranged for virtual visit via doximity with Richardson Dopp, PA-C on 08/11/18 at 2:45 pm. Will have v/s, medicine list ready for call before visit starts.  Will route to APP Trinidad Curet covering medical assistant to be aware we still need to obtain consent.

## 2018-08-05 ENCOUNTER — Telehealth: Payer: Self-pay | Admitting: Adult Health

## 2018-08-05 NOTE — Telephone Encounter (Signed)
Burnett Kanaris called Meredith Gonzalez to schedule her hosp f/u and the pt stated that she is unable to do VV and does not have anyone who can do it for her, Pt is high risk for covid 19 she has a score of 7. Please advise if pt can come in the office to be seen.

## 2018-08-05 NOTE — Telephone Encounter (Signed)
Please reschedule her for an office visit mid to end of August as she is a high risk patient I would not recommend an office visit at this time unless absolutely needed and she understands risks of coming to the office

## 2018-08-05 NOTE — Telephone Encounter (Signed)
Sent to [place] in referrals to schedule in August 2020.

## 2018-08-05 NOTE — Telephone Encounter (Signed)
Noted I will call pt to schedule the visit in August

## 2018-08-10 NOTE — Progress Notes (Signed)
Virtual Visit via Telephone Note   This visit type was conducted due to national recommendations for restrictions regarding the COVID-19 Pandemic (e.g. social distancing) in an effort to limit this patient's exposure and mitigate transmission in our community.  Due to her co-morbid illnesses, this patient is at least at moderate risk for complications without adequate follow up.  This format is felt to be most appropriate for this patient at this time.  The patient did not have access to video technology/had technical difficulties with video requiring transitioning to audio format only (telephone).  All issues noted in this document were discussed and addressed.  No physical exam could be performed with this format.  Please refer to the patient's chart for her  consent to telehealth for [facility].   Date:  08/11/2018   ID:  Meredith Gonzalez, DOB 1935-10-29, MRN [id number]  Patient Location: Home Provider Location: Home  PCP:  Sinclair Ship, MD  Cardiologist:  Dorris Carnes, MD   Electrophysiologist:  Cristopher Peru, MD   Evaluation Performed:  Follow-Up Visit  Chief Complaint:  Hospital follow up - admitted several times in last 3 mos with strokes; s/p R CEA  History of Present Illness:    Meredith Gonzalez is a 83 y.o. female with AFib, s/p fall resulting in subdural hematoma, HTN, hypothyroidism, s/p pacemaker (RV lead revision done in 2014),coronary artery diseases/p NSTEMI in 4/18 treated with a DES to the Dx,diastolic CHF,syncope felt to be related to autonomic dysfunction. She was previouslynot felt to bea candidate for anticoagulationdue to prior fallswith subdural hematoma. Prior tilt table test was positive and she was changed from Diltiazem to Pindolol.She had a fall and fractured her c-spine in 12/2017.  She was admitted in November 2019 with atrial fibrillation with rapid rate complicated by decompensated heart failure.  She was placed on apixaban for anticoagulation.   Amiodarone was started and cardioversion was arranged after adequate loading.  She underwent successful cardioversion 03/27/2018.    She was admitted with a R brain CVA in 03/2018 and was noted to have severe RICA stenosis.  She was to have an elective R CEA once she recovered from her stroke.  However, she was readmited in 05/2018 with recurrent stroke symptoms and underwent a R CEA.  She was admitted again in 06/2018 for dizziness and suspected posterior circulation TIA.    Today, she notes she is doing well.  She has been working with home health physical therapy and home health occupational therapy.  Her left side is getting stronger.  She is able to ambulate with a walker.  She has not had shortness of breath or chest discomfort.  She sleeps on an incline secondary to prior neck injury related to a fall.  She has not had lower extremity swelling.  She has not had dizziness.  She has dark stools secondary to iron therapy.  She denies any bright red blood per rectum.  The patient does not have symptoms concerning for COVID-19 infection (fever, chills, cough, or new shortness of breath).    Past Medical History:  Diagnosis Date   Anemia    Anxiety    Arthritis    "all over" (02/11/2018)   Basal cell carcinoma    "left leg" (02/11/2018)   Cervical spine fracture ([place]) 12/2017   "C1-2"   CHF (congestive heart failure) (HCC)    Chronic bronchitis (HCC)    Chronic neck pain    "since I broke my neck 6-8 wk ago" (02/11/2018)   Diabetic  peripheral neuropathy (HCC)    Fibromyalgia    Fracture of right humerus    Generalized weakness    Headache    "weekly" (02/11/2018)   History of blood transfusion    "related to one of my femur surgeries" (02/11/2018)   History of echocardiogram    Echo 8/18: EF 50-55, no RWMA, Gr 1 DD, calcified AV leaflets, MAC, trivial MR, mod LAE, PASP 37   Hypertension    Hypothyroidism    Incontinence of urine    Ischemic stroke ([place]) 2000    "lost part of the vision in my right eye" (02/11/2018)   Major depression, chronic    Myocardial infarction ([place]) 06/2016   Pacemaker    Recurrent falls    Syncope and collapse    Type 2 diabetes, diet controlled ([place])    Past Surgical History:  Procedure Laterality Date   ABDOMINAL HYSTERECTOMY     ANKLE FRACTURE SURGERY Right    BASAL CELL CARCINOMA EXCISION Left    "leg"   CARDIAC CATHETERIZATION  ~ 2014   CARDIOVERSION N/A 02/13/2018   Procedure: CARDIOVERSION;  Surgeon: Sueanne Margarita, MD;  Location: [facility];  Service: Cardiovascular;  Laterality: N/A;   CARDIOVERSION N/A 03/27/2018   Procedure: CARDIOVERSION;  Surgeon: Lelon Perla, MD;  Location: [employer] [facility];  Service: Cardiovascular;  Laterality: N/A;   CARPAL TUNNEL RELEASE     bilateral   CATARACT EXTRACTION W/ INTRAOCULAR LENS  IMPLANT, BILATERAL Bilateral    CHOLECYSTECTOMY OPEN     COLONOSCOPY     CORONARY ANGIOPLASTY WITH STENT PLACEMENT  06/2016   CORONARY STENT INTERVENTION N/A 07/19/2016   Procedure: Coronary Stent Intervention;  Surgeon: Peter M Martinique, MD;  Location: [facility] CV LAB;  Service: Cardiovascular;  Laterality: N/A;   ENDARTERECTOMY Right 06/03/2018   Procedure: ENDARTERECTOMY CAROTID;  Surgeon: Rosetta Posner, MD;  Location: [facility];  Service: Vascular;  Laterality: Right;   EYE SURGERY     right eye catarace/lens implant   FEMUR FRACTURE SURGERY Bilateral    FRACTURE SURGERY     GASTRIC BYPASS     INSERT / REPLACE / REMOVE PACEMAKER  ~ 2012   LEAD REVISION N/A 01/01/2013   Procedure: LEAD REVISION;  Surgeon: Evans Lance, MD;  [facility] C[facility] LAB;  Service: Cardiovascular;  Laterality: N/A;   LEFT HEART CATH AND CORONARY ANGIOGRAPHY N/A 07/19/2016   Procedure: Left Heart Cath and Coronary Angiography;  Surgeon: Peter M Martinique, MD;  Location: [facility] CV LAB;  Service: Cardiovascular;  Laterality: N/A;   OVARIAN CYST SURGERY     "one side only"   PATCH  ANGIOPLASTY Right 06/03/2018   Procedure: PATCH ANGIOPLASTY USING HEMASHIELD PLATINUM FINESSE;  Surgeon: Rosetta Posner, MD;  Location: [facility];  Service: Vascular;  Laterality: Right;   TEE WITHOUT CARDIOVERSION N/A 02/13/2018   Procedure: TRANSESOPHAGEAL ECHOCARDIOGRAM (TEE);  Surgeon: Sueanne Margarita, MD;  Location: [facility] ENDOSCOPY;  Service: Cardiovascular;  Laterality: N/A;   TONSILLECTOMY       Current Meds  Medication Sig   acetaminophen (TYLENOL) 500 MG tablet Take 500 mg by mouth every 6 (six) hours as needed for moderate pain.    albuterol (PROVENTIL HFA;VENTOLIN HFA) 108 (90 Base) MCG/ACT inhaler Inhale 2 puffs into the lungs every 6 (six) hours as needed for wheezing or shortness of breath.   amiodarone (PACERONE) 200 MG tablet Take 1 tablet (200 mg total) by mouth every morning.   apixaban (ELIQUIS) 2.5 MG TABS tablet  Take 1 tablet (2.5 mg total) by mouth 2 (two) times daily.   atorvastatin (LIPITOR) 20 MG tablet Take 1 tablet (20 mg total) by mouth daily for 30 days.   budesonide-formoterol (SYMBICORT) 160-4.5 MCG/ACT inhaler Inhale 2 puffs into the lungs 2 (two) times daily.   calcitonin, salmon, (MIACALCIN/FORTICAL) 200 UNIT/ACT nasal spray Place 1 spray into the nose daily.   docusate sodium (COLACE) 100 MG capsule Take 1 capsule (100 mg total) by mouth daily.   ferrous sulfate 325 (65 FE) MG tablet Take 1 tablet (325 mg total) by mouth 2 (two) times daily with a meal.   flintstones complete (FLINTSTONES) 60 MG chewable tablet Chew 1 tablet by mouth daily.   gabapentin (NEURONTIN) 300 MG capsule Take 1-2 capsules (300-600 mg total) by mouth See admin instructions. Take 1 capsule (300 mg) by mouth in the mornning & take 2 capsules (600 mg) by mouth at night   levothyroxine (SYNTHROID, LEVOTHROID) 75 MCG tablet Take 1 tablet (75 mcg total) by mouth daily at 6 (six) AM.   nitroGLYCERIN (NITROSTAT) 0.4 MG SL tablet Place 1 tablet (0.4 mg total) under the tongue every 5  (five) minutes as needed for chest pain.   pantoprazole (PROTONIX) 40 MG tablet Take 1 tablet (40 mg total) by mouth daily.   vitamin B-12 (CYANOCOBALAMIN) 1000 MCG tablet Take 1 tablet (1,000 mcg total) by mouth daily.     Allergies:   Valium [diazepam]; Robaxin [methocarbamol]; Tramadol; Codeine; Darifenacin; Darvon [propoxyphene hcl]; Daypro [oxaprozin]; Enablex [darifenacin hydrobromide er]; Oxycodone; Propoxyphene; Risperdal [risperidone]; Talwin [pentazocine]; and Vicodin [hydrocodone-acetaminophen]   Social History   Tobacco Use   Smoking status: Former Smoker    Packs/day: 0.50    Years: 4.00    Pack years: 2.00    Types: Cigarettes    Last attempt to quit: 08/07/1972    Years since quitting: 46.0   Smokeless tobacco: Never Used  Substance Use Topics   Alcohol use: Yes    Frequency: Never    Comment: once or twice a year   Drug use: Never     Family Hx: The patient's family history includes Alcoholism in her brother, brother, father, and sister; Congestive Heart Failure in her mother; Heart attack in her father; Throat cancer in her brother.  ROS:   Please see the history of present illness.     All other systems reviewed and are negative.   Prior CV studies:   The following studies were reviewed today:  Echo 05/31/2018 EF 60-65, no RWMA, RVSP 38.3, mild LAE, mild annular AoV calcification, mild MAC  Echo 04/24/2018 EF 60-65, mild TR  TEE 02/13/18 EF 50-55, no RWMA, mild MR, mod LAE, mild RV dilation, mild RAE, atrial septal lipomatous hypertrophy, no R-L atrial level shunt, mod TR, mild PI  Echo 02/11/18 EF 50-55, mild MR, mod LAE, mod RV dilation, mod RAE, severe TR, PASP 49  LHC 07/19/16 LM normal LAD okay, D1 95 LCx normal RCA proximal 20 EF 35-45 LVEDP 20 PCI: 2.5 x 16 mm Promus DES to the D1  Limited echo 07/17/16 EF 50, apical anterior, apical septal and true apical HK, MAC, trivial MR, mild LAE  Echo 07/14/16 Apical, apical  inferolateral, apical anterior, apical lateral HK, normal LV function, grade 2 diastolic dysfunction, trivial MR, mild LAE, PASP 50  Myoview 07/16/16 Anteroapical infarct with peri-infarct ischemia, EF 38, high risk  Labs/Other Tests and Data Reviewed:    EKG:  No ECG reviewed.  Recent Labs: 02/10/2018: Magnesium 2.1  06/23/2018: TSH 10.821 07/09/2018: B Natriuretic Peptide 135.1 07/10/2018: ALT 21; BUN 16; Creatinine, Ser 0.64; Hemoglobin 10.7; Platelets 295; Potassium 3.9; Sodium 138   Recent Lipid Panel Lab Results  Component Value Date/Time   CHOL 88 07/10/2018 03:24 AM   CHOL 95 (L) 03/20/2017 07:28 AM   TRIG 26 07/10/2018 03:24 AM   HDL 52 07/10/2018 03:24 AM   HDL 58 03/20/2017 07:28 AM   CHOLHDL 1.7 07/10/2018 03:24 AM   LDLCALC 31 07/10/2018 03:24 AM   LDLCALC 28 03/20/2017 07:28 AM    Wt Readings from Last 3 Encounters:  07/09/18 122 lb (55.3 kg)  06/30/18 122 lb (55.3 kg)  06/27/18 134 lb 15.1 oz (61.2 kg)     Objective:    Vital Signs:  BP (!) 119/59 (BP Location: Right Arm, Patient Position: Sitting, Cuff Size: Normal)    Pulse 84    Ht _0  (1.651 m)    BMI 20.30 kg/m    VITAL SIGNS:  reviewed GEN:  no acute distress RESPIRATORY:  No labored breathing NEURO:  Alert and oriented PSYCH:  She seems to be in good spirits  ASSESSMENT & PLAN:     Chronic heart failure with preserved ejection fraction ([place]) EF 60-65 by echocardiogram March 2020.  Volume status seems to be stable.  Continue current medical regimen.  Persistent atrial fibrillation She is status post cardioversion in January 2020.  She has achieved rhythm control with amiodarone therapy.  She is on the correct dose of Apixaban as she is 83 years old and her weight is less than 60 kg.  Recent LFTs were normal.  TSH in March was 10.  We will try to arrange a follow-up TSH at some point in the near future.  History of stroke As noted, her left side is getting stronger.  She will continue work with  physical therapy and Occupational Therapy.  She has follow-up soon with the physiatrist.  She sees neurology in August.  Right-sided carotid artery disease, unspecified type ([place]) Status post carotid endarterectomy.  We will contact vascular surgery to make sure she has follow-up.  Essential hypertension The patient's blood pressure is controlled on her current regimen.  Continue current therapy.   Cardiac pacemaker in situ Continue follow-up with EP as planned.  COVID-19 Education: The signs and symptoms of COVID-19 were discussed with the patient and how to seek care for testing (follow up with PCP or arrange E-visit).  The importance of social distancing was discussed today.  Time:   Today, I have spent 18.75 minutes with the patient with telehealth technology discussing the above problems.     Medication Adjustments/Labs and Tests Ordered: Current medicines are reviewed at length with the patient today.  Concerns regarding medicines are outlined above.   Tests Ordered: No orders of the defined types were placed in this encounter.   Medication Changes: No orders of the defined types were placed in this encounter.   Disposition:  Follow up in 6 month(s)  Signed, Richardson Dopp, PA-C  08/11/2018 4:55 PM    [facility]

## 2018-08-11 ENCOUNTER — Encounter: Payer: Self-pay | Admitting: Physician Assistant

## 2018-08-11 ENCOUNTER — Other Ambulatory Visit: Payer: Self-pay

## 2018-08-11 ENCOUNTER — Telehealth (INDEPENDENT_AMBULATORY_CARE_PROVIDER_SITE_OTHER): Payer: Medicare Other | Admitting: Physician Assistant

## 2018-08-11 VITALS — BP 119/59 | HR 84 | Ht 65.0 in

## 2018-08-11 DIAGNOSIS — Z950 Presence of cardiac pacemaker: Secondary | ICD-10-CM

## 2018-08-11 DIAGNOSIS — I5032 Chronic diastolic (congestive) heart failure: Secondary | ICD-10-CM | POA: Diagnosis not present

## 2018-08-11 DIAGNOSIS — I779 Disorder of arteries and arterioles, unspecified: Secondary | ICD-10-CM

## 2018-08-11 DIAGNOSIS — Z7189 Other specified counseling: Secondary | ICD-10-CM

## 2018-08-11 DIAGNOSIS — Z8673 Personal history of transient ischemic attack (TIA), and cerebral infarction without residual deficits: Secondary | ICD-10-CM

## 2018-08-11 DIAGNOSIS — I10 Essential (primary) hypertension: Secondary | ICD-10-CM

## 2018-08-11 DIAGNOSIS — I4819 Other persistent atrial fibrillation: Secondary | ICD-10-CM

## 2018-08-11 NOTE — Patient Instructions (Signed)
Medication Instructions:  Continue current medications.  If you need a refill on your cardiac medications before your next appointment, please call your pharmacy.   Lab work: We will try to arrange a thyroid blood test (TSH, free T4) in the next few weeks.  If you have labs (blood work) drawn today and your tests are completely normal, you will receive your results only by: Marland Kitchen MyChart Message (if you have MyChart) OR . A paper copy in the mail If you have any lab test that is abnormal or we need to change your treatment, we will call you to review the results.  Testing/Procedures: None  Follow-Up: At [facility], you and your health needs are our priority.  As part of our continuing mission to provide you with exceptional heart care, we have created designated Provider Care Teams.  These Care Teams include your primary Cardiologist (physician) and Advanced Practice Providers (APPs -  Physician Assistants and Nurse Practitioners) who all work together to provide you with the care you need, when you need it. You will need a follow up appointment in:  6 months.  Please call our office 2 months in advance to schedule this appointment.  You may see Dorris Carnes, MD or Richardson Dopp, PA-C   Any Other Special Instructions Will Be Listed Below (If Applicable).

## 2018-08-12 ENCOUNTER — Telehealth: Payer: Self-pay | Admitting: *Deleted

## 2018-08-12 NOTE — Telephone Encounter (Signed)
SPOKE WITH BONNIE AT [facility] WHO WORKS WITH THE REFERRALS AND PROCESS  PT WILL BE SET UP WITH A NEW NURSE EVALUATION TO GET REQUEST LABS TSH AND FREE T4  LABS WILL BE DRAWN SAT OR SUN  LABS RESULTS WILL BE FAXED [phone] [facility] REQUESTED  PROVIDER   CONTACTED  [facility]  AND PT APPT IS DUE IN FEB 2021 RECALL SCHEDULERS SAID THEY WILL CONTACT JAN  FOR APPT

## 2018-08-12 NOTE — Telephone Encounter (Signed)
Thank you! Richardson Dopp, PA-C    08/12/2018 4:51 PM

## 2018-08-17 ENCOUNTER — Observation Stay (HOSPITAL_COMMUNITY): Payer: Medicare Other

## 2018-08-17 ENCOUNTER — Other Ambulatory Visit: Payer: Self-pay

## 2018-08-17 ENCOUNTER — Emergency Department (HOSPITAL_BASED_OUTPATIENT_CLINIC_OR_DEPARTMENT_OTHER): Payer: Medicare Other

## 2018-08-17 ENCOUNTER — Inpatient Hospital Stay (HOSPITAL_BASED_OUTPATIENT_CLINIC_OR_DEPARTMENT_OTHER)
Admission: EM | Admit: 2018-08-17 | Discharge: 2018-08-22 | DRG: 069 | Disposition: A | Discharge status: Skilled Nursing Facility | Payer: Medicare Other | Attending: Internal Medicine | Admitting: Internal Medicine

## 2018-08-17 ENCOUNTER — Encounter (HOSPITAL_BASED_OUTPATIENT_CLINIC_OR_DEPARTMENT_OTHER): Payer: Self-pay

## 2018-08-17 DIAGNOSIS — Z85828 Personal history of other malignant neoplasm of skin: Secondary | ICD-10-CM

## 2018-08-17 DIAGNOSIS — R339 Retention of urine, unspecified: Secondary | ICD-10-CM | POA: Diagnosis present

## 2018-08-17 DIAGNOSIS — M797 Fibromyalgia: Secondary | ICD-10-CM | POA: Diagnosis present

## 2018-08-17 DIAGNOSIS — Z90710 Acquired absence of both cervix and uterus: Secondary | ICD-10-CM

## 2018-08-17 DIAGNOSIS — E11319 Type 2 diabetes mellitus with unspecified diabetic retinopathy without macular edema: Secondary | ICD-10-CM | POA: Diagnosis not present

## 2018-08-17 DIAGNOSIS — M542 Cervicalgia: Secondary | ICD-10-CM | POA: Diagnosis present

## 2018-08-17 DIAGNOSIS — R296 Repeated falls: Secondary | ICD-10-CM | POA: Diagnosis present

## 2018-08-17 DIAGNOSIS — Z8673 Personal history of transient ischemic attack (TIA), and cerebral infarction without residual deficits: Secondary | ICD-10-CM | POA: Diagnosis not present

## 2018-08-17 DIAGNOSIS — M1990 Unspecified osteoarthritis, unspecified site: Secondary | ICD-10-CM | POA: Diagnosis not present

## 2018-08-17 DIAGNOSIS — Z961 Presence of intraocular lens: Secondary | ICD-10-CM | POA: Diagnosis present

## 2018-08-17 DIAGNOSIS — I252 Old myocardial infarction: Secondary | ICD-10-CM

## 2018-08-17 DIAGNOSIS — I951 Orthostatic hypotension: Secondary | ICD-10-CM | POA: Diagnosis present

## 2018-08-17 DIAGNOSIS — Z7901 Long term (current) use of anticoagulants: Secondary | ICD-10-CM

## 2018-08-17 DIAGNOSIS — Z9842 Cataract extraction status, left eye: Secondary | ICD-10-CM | POA: Diagnosis not present

## 2018-08-17 DIAGNOSIS — Z8249 Family history of ischemic heart disease and other diseases of the circulatory system: Secondary | ICD-10-CM

## 2018-08-17 DIAGNOSIS — I4819 Other persistent atrial fibrillation: Secondary | ICD-10-CM | POA: Diagnosis present

## 2018-08-17 DIAGNOSIS — Z66 Do not resuscitate: Secondary | ICD-10-CM | POA: Diagnosis present

## 2018-08-17 DIAGNOSIS — Z9861 Coronary angioplasty status: Secondary | ICD-10-CM

## 2018-08-17 DIAGNOSIS — R531 Weakness: Secondary | ICD-10-CM

## 2018-08-17 DIAGNOSIS — E860 Dehydration: Secondary | ICD-10-CM

## 2018-08-17 DIAGNOSIS — I480 Paroxysmal atrial fibrillation: Secondary | ICD-10-CM | POA: Diagnosis present

## 2018-08-17 DIAGNOSIS — R4781 Slurred speech: Secondary | ICD-10-CM

## 2018-08-17 DIAGNOSIS — Z1159 Encounter for screening for other viral diseases: Secondary | ICD-10-CM | POA: Diagnosis not present

## 2018-08-17 DIAGNOSIS — J45909 Unspecified asthma, uncomplicated: Secondary | ICD-10-CM | POA: Diagnosis present

## 2018-08-17 DIAGNOSIS — Z811 Family history of alcohol abuse and dependence: Secondary | ICD-10-CM

## 2018-08-17 DIAGNOSIS — I4510 Unspecified right bundle-branch block: Secondary | ICD-10-CM | POA: Diagnosis present

## 2018-08-17 DIAGNOSIS — E039 Hypothyroidism, unspecified: Secondary | ICD-10-CM | POA: Diagnosis present

## 2018-08-17 DIAGNOSIS — E785 Hyperlipidemia, unspecified: Secondary | ICD-10-CM | POA: Diagnosis present

## 2018-08-17 DIAGNOSIS — I6522 Occlusion and stenosis of left carotid artery: Secondary | ICD-10-CM | POA: Diagnosis present

## 2018-08-17 DIAGNOSIS — I5032 Chronic diastolic (congestive) heart failure: Secondary | ICD-10-CM | POA: Diagnosis not present

## 2018-08-17 DIAGNOSIS — Z79899 Other long term (current) drug therapy: Secondary | ICD-10-CM

## 2018-08-17 DIAGNOSIS — R29700 NIHSS score 0: Secondary | ICD-10-CM | POA: Diagnosis present

## 2018-08-17 DIAGNOSIS — Z7951 Long term (current) use of inhaled steroids: Secondary | ICD-10-CM

## 2018-08-17 DIAGNOSIS — I110 Hypertensive heart disease with heart failure: Secondary | ICD-10-CM | POA: Diagnosis present

## 2018-08-17 DIAGNOSIS — E119 Type 2 diabetes mellitus without complications: Secondary | ICD-10-CM

## 2018-08-17 DIAGNOSIS — Z885 Allergy status to narcotic agent status: Secondary | ICD-10-CM

## 2018-08-17 DIAGNOSIS — G459 Transient cerebral ischemic attack, unspecified: Principal | ICD-10-CM

## 2018-08-17 DIAGNOSIS — I959 Hypotension, unspecified: Secondary | ICD-10-CM | POA: Diagnosis present

## 2018-08-17 DIAGNOSIS — E1136 Type 2 diabetes mellitus with diabetic cataract: Secondary | ICD-10-CM | POA: Diagnosis not present

## 2018-08-17 DIAGNOSIS — E876 Hypokalemia: Secondary | ICD-10-CM

## 2018-08-17 DIAGNOSIS — G8929 Other chronic pain: Secondary | ICD-10-CM | POA: Diagnosis present

## 2018-08-17 DIAGNOSIS — I2510 Atherosclerotic heart disease of native coronary artery without angina pectoris: Secondary | ICD-10-CM | POA: Diagnosis present

## 2018-08-17 DIAGNOSIS — Z9109 Other allergy status, other than to drugs and biological substances: Secondary | ICD-10-CM

## 2018-08-17 DIAGNOSIS — Z9841 Cataract extraction status, right eye: Secondary | ICD-10-CM

## 2018-08-17 DIAGNOSIS — Z950 Presence of cardiac pacemaker: Secondary | ICD-10-CM

## 2018-08-17 DIAGNOSIS — E1142 Type 2 diabetes mellitus with diabetic polyneuropathy: Secondary | ICD-10-CM | POA: Diagnosis present

## 2018-08-17 DIAGNOSIS — Z808 Family history of malignant neoplasm of other organs or systems: Secondary | ICD-10-CM

## 2018-08-17 DIAGNOSIS — F419 Anxiety disorder, unspecified: Secondary | ICD-10-CM | POA: Diagnosis present

## 2018-08-17 DIAGNOSIS — Z86718 Personal history of other venous thrombosis and embolism: Secondary | ICD-10-CM

## 2018-08-17 DIAGNOSIS — Z87891 Personal history of nicotine dependence: Secondary | ICD-10-CM

## 2018-08-17 DIAGNOSIS — Z79890 Hormone replacement therapy (postmenopausal): Secondary | ICD-10-CM

## 2018-08-17 LAB — CBC
HCT: 43.5 % (ref 36.0–46.0)
Hemoglobin: 13.5 g/dL (ref 12.0–15.0)
MCH: 32.9 pg (ref 26.0–34.0)
MCHC: 31 g/dL (ref 30.0–36.0)
MCV: 106.1 fL — ABNORMAL HIGH (ref 80.0–100.0)
Platelets: 194 10*3/uL (ref 150–400)
RBC: 4.1 MIL/uL (ref 3.87–5.11)
RDW: 13.6 % (ref 11.5–15.5)
WBC: 7.6 10*3/uL (ref 4.0–10.5)
nRBC: 0 % (ref 0.0–0.2)

## 2018-08-17 LAB — COMPREHENSIVE METABOLIC PANEL
ALT: 17 U/L (ref 0–44)
AST: 20 U/L (ref 15–41)
Albumin: 3.6 g/dL (ref 3.5–5.0)
Alkaline Phosphatase: 98 U/L (ref 38–126)
Anion gap: 12 (ref 5–15)
BUN: 31 mg/dL — ABNORMAL HIGH (ref 8–23)
CO2: 26 mmol/L (ref 22–32)
Calcium: 8.4 mg/dL — ABNORMAL LOW (ref 8.9–10.3)
Chloride: 104 mmol/L (ref 98–111)
Creatinine, Ser: 1 mg/dL (ref 0.44–1.00)
GFR calc Af Amer: >60 mL/min (ref >60)
GFR calc non Af Amer: 53 mL/min — ABNORMAL LOW (ref >60)
Glucose, Bld: 106 mg/dL — ABNORMAL HIGH (ref 70–99)
Potassium: 3 mmol/L — ABNORMAL LOW (ref 3.5–5.1)
Sodium: 142 mmol/L (ref 135–145)
Total Bilirubin: 0.6 mg/dL (ref 0.3–1.2)
Total Protein: 7.1 g/dL (ref 6.5–8.1)

## 2018-08-17 LAB — DIFFERENTIAL
Abs Immature Granulocytes: 0.02 10*3/uL (ref 0.00–0.07)
Basophils Absolute: 0 10*3/uL (ref 0.0–0.1)
Basophils Relative: 0 %
Eosinophils Absolute: 0 10*3/uL (ref 0.0–0.5)
Eosinophils Relative: 0 %
Immature Granulocytes: 0 %
Lymphocytes Relative: 15 %
Lymphs Abs: 1.2 10*3/uL (ref 0.7–4.0)
Monocytes Absolute: 0.6 10*3/uL (ref 0.1–1.0)
Monocytes Relative: 8 %
Neutro Abs: 5.7 10*3/uL (ref 1.7–7.7)
Neutrophils Relative %: 77 %

## 2018-08-17 LAB — PROTIME-INR
INR: 1.3 — ABNORMAL HIGH (ref 0.8–1.2)
Prothrombin Time: 16.2 seconds — ABNORMAL HIGH (ref 11.4–15.2)

## 2018-08-17 LAB — LIPASE, BLOOD: Lipase: 31 U/L (ref 11–51)

## 2018-08-17 LAB — CBG MONITORING, ED: Glucose-Capillary: 110 mg/dL — ABNORMAL HIGH (ref 70–99)

## 2018-08-17 LAB — TROPONIN I: Troponin I: <0.03 ng/mL (ref <0.03)

## 2018-08-17 LAB — SARS CORONAVIRUS 2 AG (30 MIN TAT): SARS Coronavirus 2 Ag: NEGATIVE

## 2018-08-17 LAB — APTT: aPTT: 43 seconds — ABNORMAL HIGH (ref 24–36)

## 2018-08-17 LAB — BRAIN NATRIURETIC PEPTIDE: B Natriuretic Peptide: 54.3 pg/mL (ref 0.0–100.0)

## 2018-08-17 MED ORDER — POTASSIUM CHLORIDE 20 MEQ/15ML (10%) PO SOLN
40.0000 meq | Freq: Once | ORAL | Status: AC
  Administered 2018-08-17: 40 meq via ORAL
  Filled 2018-08-17: qty 30

## 2018-08-17 MED ORDER — FERROUS SULFATE 325 (65 FE) MG PO TABS
325.0000 mg | ORAL_TABLET | Freq: Two times a day (BID) | ORAL | Status: DC
  Administered 2018-08-18 – 2018-08-22 (×9): 325 mg via ORAL
  Filled 2018-08-17 (×9): qty 1

## 2018-08-17 MED ORDER — GABAPENTIN 300 MG PO CAPS
300.0000 mg | ORAL_CAPSULE | Freq: Every morning | ORAL | Status: DC
  Administered 2018-08-18 – 2018-08-22 (×5): 300 mg via ORAL
  Filled 2018-08-17 (×6): qty 1

## 2018-08-17 MED ORDER — CALCITONIN (SALMON) 200 UNIT/ACT NA SOLN
1.0000 | Freq: Every day | NASAL | Status: DC
  Administered 2018-08-18 – 2018-08-22 (×5): 1 via NASAL
  Filled 2018-08-17: qty 3.7

## 2018-08-17 MED ORDER — ATORVASTATIN CALCIUM 10 MG PO TABS
20.0000 mg | ORAL_TABLET | Freq: Every day | ORAL | Status: DC
  Administered 2018-08-18 – 2018-08-22 (×5): 20 mg via ORAL
  Filled 2018-08-17 (×5): qty 2

## 2018-08-17 MED ORDER — STROKE: EARLY STAGES OF RECOVERY BOOK
Freq: Once | Status: AC
  Administered 2018-08-17: 22:00:00
  Filled 2018-08-17: qty 1

## 2018-08-17 MED ORDER — VITAMIN B-12 1000 MCG PO TABS
1000.0000 ug | ORAL_TABLET | Freq: Every day | ORAL | Status: DC
  Administered 2018-08-18 – 2018-08-22 (×5): 1000 ug via ORAL
  Filled 2018-08-17 (×5): qty 1

## 2018-08-17 MED ORDER — IOHEXOL 350 MG/ML SOLN
75.0000 mL | Freq: Once | INTRAVENOUS | Status: AC | PRN
  Administered 2018-08-17: 75 mL via INTRAVENOUS

## 2018-08-17 MED ORDER — GABAPENTIN 300 MG PO CAPS
600.0000 mg | ORAL_CAPSULE | Freq: Every day | ORAL | Status: DC
  Administered 2018-08-17 – 2018-08-21 (×5): 600 mg via ORAL
  Filled 2018-08-17 (×5): qty 2

## 2018-08-17 MED ORDER — FLINTSTONES COMPLETE 60 MG PO CHEW: 1.0000 | CHEWABLE_TABLET | Freq: Every day | ORAL | Status: DC

## 2018-08-17 MED ORDER — ACETAMINOPHEN 325 MG PO TABS
650.0000 mg | ORAL_TABLET | Freq: Four times a day (QID) | ORAL | Status: DC | PRN
  Administered 2018-08-17 – 2018-08-22 (×13): 650 mg via ORAL
  Filled 2018-08-17 (×12): qty 2

## 2018-08-17 MED ORDER — PANTOPRAZOLE SODIUM 40 MG PO TBEC
40.0000 mg | DELAYED_RELEASE_TABLET | Freq: Every day | ORAL | Status: DC
  Administered 2018-08-18 – 2018-08-22 (×5): 40 mg via ORAL
  Filled 2018-08-17 (×5): qty 1

## 2018-08-17 MED ORDER — POTASSIUM CHLORIDE CRYS ER 20 MEQ PO TBCR
40.0000 meq | EXTENDED_RELEASE_TABLET | Freq: Once | ORAL | Status: DC
  Filled 2018-08-17: qty 2

## 2018-08-17 MED ORDER — ALBUTEROL SULFATE HFA 108 (90 BASE) MCG/ACT IN AERS
2.0000 | INHALATION_SPRAY | Freq: Four times a day (QID) | RESPIRATORY_TRACT | Status: DC | PRN
  Filled 2018-08-17: qty 6.7

## 2018-08-17 MED ORDER — SODIUM CHLORIDE 0.9 % IV BOLUS
500.0000 mL | Freq: Once | INTRAVENOUS | Status: AC
  Administered 2018-08-17: 500 mL via INTRAVENOUS

## 2018-08-17 MED ORDER — APIXABAN 2.5 MG PO TABS
2.5000 mg | ORAL_TABLET | Freq: Two times a day (BID) | ORAL | Status: DC
  Administered 2018-08-17 – 2018-08-19 (×4): 2.5 mg via ORAL
  Filled 2018-08-17 (×4): qty 1

## 2018-08-17 MED ORDER — MOMETASONE FURO-FORMOTEROL FUM 200-5 MCG/ACT IN AERO
2.0000 | INHALATION_SPRAY | Freq: Two times a day (BID) | RESPIRATORY_TRACT | Status: DC
  Administered 2018-08-17 – 2018-08-22 (×10): 2 via RESPIRATORY_TRACT
  Filled 2018-08-17: qty 8.8

## 2018-08-17 MED ORDER — IOHEXOL 300 MG/ML  SOLN
100.0000 mL | Freq: Once | INTRAMUSCULAR | Status: AC | PRN
  Administered 2018-08-17: 100 mL via INTRAVENOUS

## 2018-08-17 MED ORDER — ADULT MULTIVITAMIN W/MINERALS CH
1.0000 | ORAL_TABLET | Freq: Every day | ORAL | Status: DC
  Administered 2018-08-18 – 2018-08-22 (×5): 1 via ORAL
  Filled 2018-08-17 (×5): qty 1

## 2018-08-17 MED ORDER — LEVOTHYROXINE SODIUM 75 MCG PO TABS
75.0000 ug | ORAL_TABLET | Freq: Every day | ORAL | Status: DC
  Administered 2018-08-18 – 2018-08-22 (×5): 75 ug via ORAL
  Filled 2018-08-17 (×5): qty 1

## 2018-08-17 MED ORDER — AMIODARONE HCL 200 MG PO TABS
200.0000 mg | ORAL_TABLET | Freq: Every morning | ORAL | Status: DC
  Administered 2018-08-18 – 2018-08-22 (×5): 200 mg via ORAL
  Filled 2018-08-17 (×5): qty 1

## 2018-08-17 MED ORDER — DOCUSATE SODIUM 100 MG PO CAPS
100.0000 mg | ORAL_CAPSULE | Freq: Every day | ORAL | Status: DC
  Administered 2018-08-18: 100 mg via ORAL
  Filled 2018-08-17 (×5): qty 1

## 2018-08-17 MED ORDER — POTASSIUM CHLORIDE 10 MEQ/100ML IV SOLN
10.0000 meq | Freq: Once | INTRAVENOUS | Status: AC
  Administered 2018-08-17: 10 meq via INTRAVENOUS
  Filled 2018-08-17: qty 100

## 2018-08-17 NOTE — ED Provider Notes (Signed)
[facility] EMERGENCY DEPARTMENT Provider Note   CSN: [id number] Arrival date & time: 08/17/18  1407    History   Chief Complaint Chief Complaint  Patient presents with  . Weakness    HPI Meredith Gonzalez is a 83 y.o. female.   Meredith Gonzalez is a 83 y.o. female with a history of multiple recent strokes, hypertension, CHF, MI, diabetes, who presents from home accompanied by her son for evaluation of slurred speech and weakness.  Last known normal 5:30 PM yesterday when the son spoke to her on the phone at that time she had normal speech and appeared to be at her baseline.  This morning she began having slurred speech, home health aide also felt that she was leaning to the right when ambulating with her walker.  Speech has been persistently slurred throughout the day.  She also reports that she feels generally weak son reports it seems as though she has much less energy than over the past few days.  She has not been eating and drinking as well over the past 2 days.  She has not had any fevers, denies chest pain, shortness of breath or cough.  She does report some epigastric discomfort over the past 4 days she reports that with that she has had decreased appetite, slight nausea but no vomiting.  No diarrhea, melena or hematochezia.  She denies any dysuria or urinary frequency.  Per her son she was just discharged from rehab 2 weeks ago and has been at home with home health aide and it for seem to be improving but over the past few days has had poor intake and poor energy but speech is an acute change from yesterday, son is concerned that she may have had another stroke she has been admitted to the hospital numerous times since January with similar issues.     Past Medical History:  Diagnosis Date  . Anemia   . Anxiety   . Arthritis    "all over" (02/11/2018)  . Basal cell carcinoma    "left leg" (02/11/2018)  . Cervical spine fracture ([place]) 12/2017   "C1-2"  . CHF (congestive  heart failure) ([place])   . Chronic bronchitis ([place])   . Chronic neck pain    "since I broke my neck 6-8 wk ago" (02/11/2018)  . Diabetic peripheral neuropathy ([place])   . Fibromyalgia   . Fracture of right humerus   . Generalized weakness   . Headache    "weekly" (02/11/2018)  . History of blood transfusion    "related to one of my femur surgeries" (02/11/2018)  . History of echocardiogram    Echo 8/18: EF 50-55, no RWMA, Gr 1 DD, calcified AV leaflets, MAC, trivial MR, mod LAE, PASP 37  . Hypertension   . Hypothyroidism   . Incontinence of urine   . Ischemic stroke ([place]) 2000   "lost part of the vision in my right eye" (02/11/2018)  . Major depression, chronic   . Myocardial infarction ([place]) 06/2016  . Pacemaker   . Recurrent falls   . Syncope and collapse   . Type 2 diabetes, diet controlled [facility])     Patient Active Problem List   Diagnosis Date Noted  . Dizziness 07/09/2018  . Stroke due to embolism of carotid artery ([place]) 06/05/2018  . Stroke-like symptom   . Dyspnea   . Acute blood loss anemia   . Acute ischemic stroke ([place]) 05/31/2018  . Stroke ([place]) 05/30/2018  . Chronic diastolic congestive heart  failure ([place])   . Anemia of chronic disease   . Hypoalbuminemia due to protein-calorie malnutrition ([place])   . Labile blood pressure   . Atrial fibrillation ([place])   . Chronic neck pain   . Subcortical infarction ([place]) 04/27/2018  . CVA (cerebral vascular accident) ([place]) 04/26/2018  . TIA (transient ischemic attack) 04/24/2018  . CAD (coronary artery disease) 08/07/2016  . (HFpEF) heart failure with preserved ejection fraction ([place]) 08/07/2016  . Ischemic cardiomyopathy 08/07/2016  . Chest pain   . NSTEMI (non-ST elevated myocardial infarction) ([place])   . Status post coronary artery stent placement   . Abnormal stress test   . SOB (shortness of breath)   . Multifocal pneumonia 07/13/2016  . Elevated troponin 07/13/2016  . Leukocytosis   . Pacemaker 05/17/2014  .  Complications, pacemaker cardiac, mechanical 12/31/2012  . Edema extremities 10/08/2012  . Persistent atrial fibrillation 10/08/2012  . Hypertension 10/08/2012  . Hypothyroidism 10/08/2012  . Neuropathy 10/08/2012  . Diabetes ([place]) 10/08/2012  . Depression 09/08/2012  . Anemia 08/08/2012  . Knee fracture, left 08/07/2012  . Recurrent falls 08/07/2012  . Syncope 08/07/2012  . SDH (subdural hematoma) ([place]) 08/07/2012    Past Surgical History:  Procedure Laterality Date  . ABDOMINAL HYSTERECTOMY    . ANKLE FRACTURE SURGERY Right   . BASAL CELL CARCINOMA EXCISION Left    "leg"  . CARDIAC CATHETERIZATION  ~ 2014  . CARDIOVERSION N/A 02/13/2018   Procedure: CARDIOVERSION;  Surgeon: Sueanne Margarita, MD;  Location: [facility] ENDOSCOPY;  Service: Cardiovascular;  Laterality: N/A;  . CARDIOVERSION N/A 03/27/2018   Procedure: CARDIOVERSION;  Surgeon: Lelon Perla, MD;  Location: [facility] ENDOSCOPY;  Service: Cardiovascular;  Laterality: N/A;  . CARPAL TUNNEL RELEASE     bilateral  . CATARACT EXTRACTION W/ INTRAOCULAR LENS  IMPLANT, BILATERAL Bilateral   . CHOLECYSTECTOMY OPEN    . COLONOSCOPY    . CORONARY ANGIOPLASTY WITH STENT PLACEMENT  06/2016  . CORONARY STENT INTERVENTION N/A 07/19/2016   Procedure: Coronary Stent Intervention;  Surgeon: Peter M Martinique, MD;  Location: [facility] CV LAB;  Service: Cardiovascular;  Laterality: N/A;  . ENDARTERECTOMY Right 06/03/2018   Procedure: ENDARTERECTOMY CAROTID;  Surgeon: Rosetta Posner, MD;  Location: [facility];  Service: Vascular;  Laterality: Right;  . EYE SURGERY     right eye catarace/lens implant  . FEMUR FRACTURE SURGERY Bilateral   . FRACTURE SURGERY    . GASTRIC BYPASS    . INSERT / REPLACE / REMOVE PACEMAKER  ~ 2012  . LEAD REVISION N/A 01/01/2013   Procedure: LEAD REVISION;  Surgeon: Evans Lance, MD;  Location[facility] CATH LAB;  Service: Cardiovascular;  Laterality: N/A;  . LEFT HEART CATH AND CORONARY ANGIOGRAPHY N/A 07/19/2016   Procedure:  Left Heart Cath and Coronary Angiography;  Surgeon: Peter M Martinique, MD;  Location: [facility] CV LAB;  Service: Cardiovascular;  Laterality: N/A;  . OVARIAN CYST SURGERY     "one side only"  . PATCH ANGIOPLASTY Right 06/03/2018   Procedure: PATCH ANGIOPLASTY USING HEMASHIELD PLATINUM FINESSE;  Surgeon: Rosetta Posner, MD;  Location: [facility];  Service: Vascular;  Laterality: Right;  . TEE WITHOUT CARDIOVERSION N/A 02/13/2018   Procedure: TRANSESOPHAGEAL ECHOCARDIOGRAM (TEE);  Surgeon: Sueanne Margarita, MD;  Location: [facility] ENDOSCOPY;  Service: Cardiovascular;  Laterality: N/A;  . TONSILLECTOMY       OB History   No obstetric history on file.      Home Medications    Prior  to Admission medications   Medication Sig Start Date End Date Taking? Authorizing Provider  acetaminophen (TYLENOL) 500 MG tablet Take 500 mg by mouth every 6 (six) hours as needed for moderate pain.     [provider]  albuterol (PROVENTIL HFA;VENTOLIN HFA) 108 (90 Base) MCG/ACT inhaler Inhale 2 puffs into the lungs every 6 (six) hours as needed for wheezing or shortness of breath. 05/07/18   Angiulli, Lavon Paganini, PA-C  amiodarone (PACERONE) 200 MG tablet Take 1 tablet (200 mg total) by mouth every morning. 05/07/18   Angiulli, Lavon Paganini, PA-C  apixaban (ELIQUIS) 2.5 MG TABS tablet Take 1 tablet (2.5 mg total) by mouth 2 (two) times daily. 07/13/18   Domenic Polite, MD  atorvastatin (LIPITOR) 20 MG tablet Take 1 tablet (20 mg total) by mouth daily for 30 days. 06/05/18   Dessa Phi, DO  budesonide-formoterol [facility]) 160-4.5 MCG/ACT inhaler Inhale 2 puffs into the lungs 2 (two) times daily. 05/20/18   Mannam, Hart Robinsons, MD  calcitonin, salmon, (MIACALCIN/FORTICAL) 200 UNIT/ACT nasal spray Place 1 spray into the nose daily. 08/04/18   [provider]  docusate sodium (COLACE) 100 MG capsule Take 1 capsule (100 mg total) by mouth daily. 06/24/18   Love, Ivan Anchors, PA-C  ferrous sulfate 325 (65 FE) MG tablet Take 1  tablet (325 mg total) by mouth 2 (two) times daily with a meal. 05/07/18   Angiulli, Lavon Paganini, PA-C  flintstones complete (FLINTSTONES) 60 MG chewable tablet Chew 1 tablet by mouth daily.    [provider]  gabapentin (NEURONTIN) 300 MG capsule Take 1-2 capsules (300-600 mg total) by mouth See admin instructions. Take 1 capsule (300 mg) by mouth in the mornning & take 2 capsules (600 mg) by mouth at night 05/07/18   Angiulli, Lavon Paganini, PA-C  levothyroxine (SYNTHROID, LEVOTHROID) 75 MCG tablet Take 1 tablet (75 mcg total) by mouth daily at 6 (six) AM. 06/24/18   Love, Ivan Anchors, PA-C  nitroGLYCERIN (NITROSTAT) 0.4 MG SL tablet Place 1 tablet (0.4 mg total) under the tongue every 5 (five) minutes as needed for chest pain. 10/11/16   Fay Records, MD  pantoprazole (PROTONIX) 40 MG tablet Take 1 tablet (40 mg total) by mouth daily. 06/24/18   Love, Ivan Anchors, PA-C  vitamin B-12 (CYANOCOBALAMIN) 1000 MCG tablet Take 1 tablet (1,000 mcg total) by mouth daily. 07/20/16   Thurnell Lose, MD    Family History Family History  Problem Relation Age of Onset  . Congestive Heart Failure Mother   . Heart attack Father   . Alcoholism Father   . Alcoholism Sister   . Alcoholism Brother   . Alcoholism Brother   . Throat cancer Brother     Social History Social History   Tobacco Use  . Smoking status: Former Smoker    Packs/day: 0.50    Years: 4.00    Pack years: 2.00    Types: Cigarettes    Last attempt to quit: 08/07/1972    Years since quitting: 46.0  . Smokeless tobacco: Never Used  Substance Use Topics  . Alcohol use: Yes    Frequency: Never    Comment: once or twice a year  . Drug use: Never     Allergies   Valium [diazepam]; Robaxin [methocarbamol]; Tramadol; Codeine; Darifenacin; Darvon [propoxyphene hcl]; Daypro [oxaprozin]; Enablex [darifenacin hydrobromide er]; Oxycodone; Propoxyphene; Risperdal [risperidone]; Talwin [pentazocine]; and Vicodin [hydrocodone-acetaminophen]    Review of Systems Review of Systems  Constitutional: Positive for appetite change and  fatigue. Negative for chills and fever.  HENT: Negative.   Eyes: Negative for visual disturbance.  Respiratory: Negative for cough and shortness of breath.   Cardiovascular: Negative for chest pain.  Gastrointestinal: Positive for abdominal pain. Negative for constipation, diarrhea, nausea and vomiting.  Genitourinary: Negative for dysuria, frequency and hematuria.  Musculoskeletal: Negative for arthralgias and myalgias.  Skin: Negative for color change and rash.  Neurological: Positive for speech difficulty and weakness. Negative for dizziness, seizures, syncope, facial asymmetry, light-headedness, numbness and headaches.     Physical Exam Updated Vital Signs BP 116/60 (BP Location: Right Arm)   Pulse 72   Temp 98.1 F (36.7 C) (Oral)   Resp 18   Ht _0  (1.651 m)   Wt 51.3 kg   SpO2 96%   BMI 18.80 kg/m   Physical Exam Vitals signs and nursing note reviewed.  Constitutional:      General: She is not in acute distress.    Appearance: Normal appearance. She is well-developed and normal weight. She is not ill-appearing or diaphoretic.     Comments: Chronically ill-appearing but in no acute distress.  HENT:     Head: Normocephalic and atraumatic.     Mouth/Throat:     Comments: Mucous membranes slightly dry Eyes:     General:        Right eye: No discharge.        Left eye: No discharge.     Extraocular Movements: Extraocular movements intact.     Conjunctiva/sclera: Conjunctivae normal.     Pupils: Pupils are equal, round, and reactive to light.  Neck:     Musculoskeletal: Neck supple.  Cardiovascular:     Rate and Rhythm: Normal rate and regular rhythm.     Pulses: Normal pulses.     Heart sounds: Normal heart sounds. No murmur. No friction rub. No gallop.   Pulmonary:     Effort: Pulmonary effort is normal. No respiratory distress.     Breath sounds: Rales present. No  wheezing.     Comments: Patient breathing comfortably on room air with no increased respiratory effort, able to speak in full sentences.  Patient with some very faint crackles in bilateral bases, no other adventitious lung sounds. Abdominal:     General: Bowel sounds are normal. There is no distension.     Palpations: Abdomen is soft. There is no mass.     Tenderness: There is abdominal tenderness. There is no guarding.     Comments: Abdomen is soft and nondistended, bowel sounds present throughout, there is some slight tenderness in the epigastric region, all other quadrants nontender to palpation, no guarding or peritoneal signs.  Musculoskeletal:        General: No deformity.  Skin:    General: Skin is warm and dry.     Capillary Refill: Capillary refill takes less than 2 seconds.  Neurological:     Mental Status: She is alert.     Coordination: Coordination normal.     Comments: Speech is slurred, able to follow commands CN III-XII intact 4 out of 5 strength in left grip when compared to right but all other extremities with equal strength. Sensation normal to light and sharp touch Moves extremities without ataxia, coordination intact   Psychiatric:        Mood and Affect: Mood normal.        Behavior: Behavior normal.      ED Treatments / Results  Labs (all labs ordered are listed, but only  abnormal results are displayed) Labs Reviewed  CBC - Abnormal; Notable for the following components:      Result Value   MCV 106.1 (*)    All other components within normal limits  COMPREHENSIVE METABOLIC PANEL - Abnormal; Notable for the following components:   Potassium 3.0 (*)    Glucose, Bld 106 (*)    BUN 31 (*)    Calcium 8.4 (*)    GFR calc non Af Amer 53 (*)    All other components within normal limits  CBG MONITORING, ED - Abnormal; Notable for the following components:   Glucose-Capillary 110 (*)    All other components within normal limits  SARS CORONAVIRUS 2 (HOSP  ORDER, PERFORMED IN [facility] VIA [facility] ID)  DIFFERENTIAL  LIPASE, BLOOD  PROTIME-INR  APTT  URINALYSIS, ROUTINE W REFLEX MICROSCOPIC  BRAIN NATRIURETIC PEPTIDE  TROPONIN I    EKG EKG Interpretation  Date/Time:  Monday Aug 17 2018 14:25:52 EDT Ventricular Rate:  73 PR Interval:    QRS Duration: 152 QT Interval:  440 QTC Calculation: 485 R Axis:   -83 Text Interpretation:  Atrial-paced rhythm RBBB and LAFB Probable left ventricular hypertrophy No significant change since last tracing Confirmed by Fredia Sorrow [phone]) on 08/17/2018 2:29:58 PM   Radiology No results found.  Procedures Procedures (including critical care time)  Medications Ordered in ED Medications  potassium chloride SA (K-DUR) CR tablet 40 mEq (has no administration in time range)  potassium chloride 10 mEq in 100 mL IVPB (has no administration in time range)     Initial Impression / Assessment and Plan / ED Course  I have reviewed the triage vital signs and the nursing notes.  Pertinent labs & imaging results that were available during my care of the patient were reviewed by me and considered in my medical decision making (see chart for details).  Patient presents with slurred speech and generalized weakness last known normal at 5:30 PM yesterday when the son spoke to her on the phone.  On arrival she has normal vitals, blood pressure of 116/60 which the son reports is a bit low for her.  She does have slurred speech on exam but does not have any other focal neurologic deficits, no vision change or gaze deviation.  She has slight 4/5 weakness in the left hand which is chronic per prior neurology notes.  No obvious right-sided weakness, but home aide reported that she was leaning to the right earlier today.  She also reports some epigastric discomfort and that she has had poor appetite, son reports she has had very little p.o. intake over the past 2 days, and has been told she needs to be eating and  drinking more due to a recent issues with dehydration.  Mucous membranes slightly dry.  She has some mild epigastric tenderness on exam but no guarding or peritoneal signs.  She was recently discontinued from her Lasix, she does not have lower extremity edema but does have some faint crackles in the lung bases.  Multiple admissions recently due to stroke symptoms, is unable to have MRI due to pacemaker.  Will check CT head, stroke labs, given epigastric pain and decreased p.o. intake we will also check CT abdomen pelvis.  Chest x-ray, BNP, troponin and EKG given slight crackles on exam and generalized weakness.  No leukocytosis to suggest infectious cause for generalized weakness, patient does have creatinine of 1, baseline is 0.6, BUN is elevated as well and she has potassium of  3.0.  No other acute electrolyte derangements and normal liver function.  Normal lipase.  EKG without concerning changes, negative troponin, BNP is not elevated and chest x-ray does not show signs of pulmonary edema, stable when compared to previous.  Patient ordered p.o. and IV potassium as well as IV fluid bolus.  I suspect that patient may be having recrudescence of prior strokes in the setting of dehydration due to poor p.o. intake.  CT head is stable when compared to previous, no new lesions or acute intracranial abnormalities, no bleeding.  CT abdomen pelvis is also reassuring, no acute findings.  We will plan to discuss with neurology as patient cannot have MRI due to pacemaker and has already been admitted multiple times recently with stroke symptoms.  Patient could have a new small stroke versus re-presentation of strength comes in the setting of dehydration.  She has already been worked up multiple times for stroke risk factors and started on home medications.  Case discussed with Dr. Cheral Marker with neurology who reviewed patient's previous imaging and admissions and agrees that this may very well be a recrudescence of  previous stroke lesions in the setting of dehydration and hypokalemia versus new small stroke.  He recommends admission to medicine team for observation to ensure that patient symptoms improved with rehydration and potassium repletion.  Will consult hospitalist for admission.  Case discussed with Dr. Laren Everts with [facility] hospitalist who will see and admit the patient, patient will be transferred via [facility] to telemetry unit.  Patient discussed with Dr. Laverta Baltimore, who saw patient as well and agrees with plan.  Final Clinical Impressions(s) / ED Diagnoses   Final diagnoses:  Slurred speech  Generalized weakness  Dehydration  Hypokalemia    ED Discharge Orders    None       Janet Berlin 08/17/18 Valerie Roys, MD 08/17/18 [phone]

## 2018-08-17 NOTE — ED Notes (Signed)
Call from lab with need for redraw for coag panel, re-ordered and re-drawn.

## 2018-08-17 NOTE — ED Notes (Signed)
Pt states unable to urinate at this time. 

## 2018-08-17 NOTE — H&P (Addendum)
TRH H&P    Patient Demographics:    Meredith Gonzalez, is a 83 y.o. female  MRN: [id number]  DOB - 1935-12-31  Admit Date - 08/17/2018  Referring MD/NP/PA:  Benedetto Goad  Outpatient Primary MD for the patient is Sinclair Ship, MD  Patient [facility]: [facility]  Chief complaint-   Bilateral leg weakness, slurred speech   HPI:    Meredith Gonzalez  is a 83 y.o. female,w CAD PCI/ stent, Pafib on apixaban, h/o pacer 0277, CHF (diastolic),   h/o CVA s/p R CEA (06/03/18), posterior circulation TIA 07/09/18,  Dm2 with retinopathy, Hypothyroidism apparently c/o difficulty walking secondary to bilateral leg weakness starting last nite along with slurred speech.  Pt states that her difficulty walking was worse this morning and therefore presented [facility] ED.  Pt denies headache, seizure, vision change, cp, palp, sob, n/v, abd pain, diarrhea, brbpr, dysuria, hematuria.    In ED,  T98.5, P 65, R 19 Bp 101/47  Pox 94% on RA Wt 57.6 kg  CT brain IMPRESSION: Stable head CT without acute intracranial findings. Stable mild atrophy and chronic small vessel ischemic changes.  CT abd/ pelvis IMPRESSION: 1. No acute findings in the abdomen or pelvis. No findings to explain the patient's history of epigastric pain with nausea. 2.  Aortic Atherosclerois (ICD10-170.0) 3. Left colonic diverticulosis without diverticulitis.  CXR  IMPRESSION: No active disease.  SARS negative  Wbc 7.6, Hgb 13.5, Mcv 106.1  Plt 194 Na 142, K 3.0, Bun 31, Creatinine 1.00 Ast 20, Alt 17 INR 1.3 PTT 43 BNP 54.3 Trop <0.03  Pt denies missing her Eliquis.   Pt received Kcl 40 meq po x1, and Kcl 73mq iv x1 in the ED  Pt will be admitted for difficulty walking  ? Leg weakness , slurred speech,  r/o TIA/ CVA        Review of systems:    In addition to the HPI above,  No Fever-chills, No Headache, No changes with Vision or  hearing, No problems swallowing food or Liquids, No Chest pain, Cough or Shortness of Breath, No Abdominal pain, No Nausea or Vomiting, bowel movements are regular, No Blood in stool or Urine, No dysuria, No new skin rashes or bruises, No new joints pains-aches,  No new tingling, numbness in any extremity, No recent weight gain or loss, No polyuria, polydypsia or polyphagia, No significant Mental Stressors.  All other systems reviewed and are negative.    Past History of the following :    Past Medical History:  Diagnosis Date   Anemia    Anxiety    Arthritis    "all over" (02/11/2018)   Basal cell carcinoma    "left leg" (02/11/2018)   Cervical spine fracture ([place] 12/2017   "C1-2"   CHF (congestive heart failure) (HCC)    Chronic bronchitis (HCC)    Chronic neck pain    "since I broke my neck 6-8 wk ago" (02/11/2018)   Diabetic peripheral neuropathy (HCC)    Fibromyalgia    Fracture of right  humerus    Generalized weakness    Headache    "weekly" (02/11/2018)   History of blood transfusion    "related to one of my femur surgeries" (02/11/2018)   History of echocardiogram    Echo 8/18: EF 50-55, no RWMA, Gr 1 DD, calcified AV leaflets, MAC, trivial MR, mod LAE, PASP 37   Hypertension    Hypothyroidism    Incontinence of urine    Ischemic stroke ([place]) 2000   "lost part of the vision in my right eye" (02/11/2018)   Major depression, chronic    Myocardial infarction ([place]) 06/2016   Pacemaker    Recurrent falls    Syncope and collapse    Type 2 diabetes, diet controlled ([place])       Past Surgical History:  Procedure Laterality Date   ABDOMINAL HYSTERECTOMY     ANKLE FRACTURE SURGERY Right    BASAL CELL CARCINOMA EXCISION Left    "leg"   CARDIAC CATHETERIZATION  ~ 2014   CARDIOVERSION N/A 02/13/2018   Procedure: CARDIOVERSION;  Surgeon: Sueanne Margarita, MD;  Location: [place];  Service: Cardiovascular;  Laterality: N/A;    CARDIOVERSION N/A 03/27/2018   Procedure: CARDIOVERSION;  Surgeon: Lelon Perla, MD;  Location: [place];  Service: Cardiovascular;  Laterality: N/A;   CARPAL TUNNEL RELEASE     bilateral   CATARACT EXTRACTION W/ INTRAOCULAR LENS  IMPLANT, BILATERAL Bilateral    CHOLECYSTECTOMY OPEN     COLONOSCOPY     CORONARY ANGIOPLASTY WITH STENT PLACEMENT  06/2016   CORONARY STENT INTERVENTION N/A 07/19/2016   Procedure: Coronary Stent Intervention;  Surgeon: Peter M Martinique, MD;  Location: [place] CV LAB;  Service: Cardiovascular;  Laterality: N/A;   ENDARTERECTOMY Right 06/03/2018   Procedure: ENDARTERECTOMY CAROTID;  Surgeon: Rosetta Posner, MD;  Location: [place];  Service: Vascular;  Laterality: Right;   EYE SURGERY     right eye catarace/lens implant   FEMUR FRACTURE SURGERY Bilateral    FRACTURE SURGERY     GASTRIC BYPASS     INSERT / REPLACE / REMOVE PACEMAKER  ~ 2012   LEAD REVISION N/A 01/01/2013   Procedure: LEAD REVISION;  Surgeon: Evans Lance, MD;  Location: [facility] CATH LAB;  Service: Cardiovascular;  Laterality: N/A;   LEFT HEART CATH AND CORONARY ANGIOGRAPHY N/A 07/19/2016   Procedure: Left Heart Cath and Coronary Angiography;  Surgeon: Peter M Martinique, MD;  Location: [place] CV LAB;  Service: Cardiovascular;  Laterality: N/A;   OVARIAN CYST SURGERY     "one side only"   PATCH ANGIOPLASTY Right 06/03/2018   Procedure: PATCH ANGIOPLASTY USING HEMASHIELD PLATINUM FINESSE;  Surgeon: Rosetta Posner, MD;  Location: [place];  Service: Vascular;  Laterality: Right;   TEE WITHOUT CARDIOVERSION N/A 02/13/2018   Procedure: TRANSESOPHAGEAL ECHOCARDIOGRAM (TEE);  Surgeon: Sueanne Margarita, MD;  Location: [facility] ENDOSCOPY;  Service: Cardiovascular;  Laterality: N/A;   TONSILLECTOMY        Social History:      Social History   Tobacco Use   Smoking status: Former Smoker    Packs/day: 0.50    Years: 4.00    Pack years: 2.00    Types: Cigarettes    Last attempt to quit:  08/07/1972    Years since quitting: 46.0   Smokeless tobacco: Never Used  Substance Use Topics   Alcohol use: Yes    Frequency: Never    Comment: once or twice a year  Family History :     Family History  Problem Relation Age of Onset   Congestive Heart Failure Mother    Heart attack Father    Alcoholism Father    Alcoholism Sister    Alcoholism Brother    Alcoholism Brother    Throat cancer Brother        Home Medications:   Prior to Admission medications   Medication Sig Start Date End Date Taking? Authorizing Provider  acetaminophen (TYLENOL) 500 MG tablet Take 500 mg by mouth every 6 (six) hours as needed for moderate pain.     [provider]  albuterol (PROVENTIL HFA;VENTOLIN HFA) 108 (90 Base) MCG/ACT inhaler Inhale 2 puffs into the lungs every 6 (six) hours as needed for wheezing or shortness of breath. 05/07/18   Angiulli, Lavon Paganini, PA-C  amiodarone (PACERONE) 200 MG tablet Take 1 tablet (200 mg total) by mouth every morning. 05/07/18   Angiulli, Lavon Paganini, PA-C  apixaban (ELIQUIS) 2.5 MG TABS tablet Take 1 tablet (2.5 mg total) by mouth 2 (two) times daily. 07/13/18   Domenic Polite, MD  atorvastatin (LIPITOR) 20 MG tablet Take 1 tablet (20 mg total) by mouth daily for 30 days. 06/05/18   Dessa Phi, DO  budesonide-formoterol [facility]) 160-4.5 MCG/ACT inhaler Inhale 2 puffs into the lungs 2 (two) times daily. 05/20/18   Mannam, Hart Robinsons, MD  calcitonin, salmon, (MIACALCIN/FORTICAL) 200 UNIT/ACT nasal spray Place 1 spray into the nose daily. 08/04/18   [provider]  docusate sodium (COLACE) 100 MG capsule Take 1 capsule (100 mg total) by mouth daily. 06/24/18   Love, Ivan Anchors, PA-C  ferrous sulfate 325 (65 FE) MG tablet Take 1 tablet (325 mg total) by mouth 2 (two) times daily with a meal. 05/07/18   Angiulli, Lavon Paganini, PA-C  flintstones complete (FLINTSTONES) 60 MG chewable tablet Chew 1 tablet by mouth daily.    [provider]    gabapentin (NEURONTIN) 300 MG capsule Take 1-2 capsules (300-600 mg total) by mouth See admin instructions. Take 1 capsule (300 mg) by mouth in the mornning & take 2 capsules (600 mg) by mouth at night 05/07/18   Angiulli, Lavon Paganini, PA-C  levothyroxine (SYNTHROID, LEVOTHROID) 75 MCG tablet Take 1 tablet (75 mcg total) by mouth daily at 6 (six) AM. 06/24/18   Love, Ivan Anchors, PA-C  nitroGLYCERIN (NITROSTAT) 0.4 MG SL tablet Place 1 tablet (0.4 mg total) under the tongue every 5 (five) minutes as needed for chest pain. 10/11/16   Fay Records, MD  pantoprazole (PROTONIX) 40 MG tablet Take 1 tablet (40 mg total) by mouth daily. 06/24/18   Love, Ivan Anchors, PA-C  vitamin B-12 (CYANOCOBALAMIN) 1000 MCG tablet Take 1 tablet (1,000 mcg total) by mouth daily. 07/20/16   Thurnell Lose, MD     Allergies:     Allergies  Allergen Reactions   Valium [Diazepam] Anxiety    Makes patient hyper   Robaxin [Methocarbamol] Other (See Comments)    Makes pt loopy/acts drunk   Tramadol Other (See Comments)    sedation   Codeine Hives and Itching    Can take with Benadryl    Darifenacin Itching    Can take with Benadryl   Darvon [Propoxyphene Hcl] Itching    Can take with Benadryl   Daypro [Oxaprozin] Itching    Can take with Benadryl   Enablex [Darifenacin Hydrobromide Er] Itching    Can take with Benadryl   Oxycodone Itching    Can take  with Benadryl    Propoxyphene Itching    Can take with Benadryl   Risperdal [Risperidone] Itching    Can take with Benadryl   [place] [Pentazocine] Itching    Can take with Benadryl   Vicodin [Hydrocodone-Acetaminophen] Itching    Can take with Benadryl      Physical Exam:   Vitals  Blood pressure (!) 101/47, pulse 65, temperature 98.5 F (36.9 C), temperature source Oral, resp. rate 19, height _0  (1.651 m), weight 57.6 kg, SpO2 94 %.  1.  General: axox3  2. Psychiatric: euthymic  3. Neurologic: cn2-12 intact , reflexes 2+ symmetric,  diffuse with no clonus, motor 5/5 in all 4ext Able to perform finger to nose, but slightly unsteady doing it, esp with right hand  4. HEENMT:  Anicteric, pupils 1.50m symmetric, direct, consensual , near intact Mm dry Neck: no jvd, no bruit  5. Respiratory : CTAB  6. Cardiovascular : rrr s1, s2 Pacer in the left upper chest  7. Gastrointestinal:  Abd: soft, nt, nd, +bs  8. Skin:  Ext: no c/c/e, no rash  9.Musculoskeletal:  Good ROM  No adenopathy    Data Review:    CBC Recent Labs  Lab 08/17/18 1507  WBC 7.6  HGB 13.5  HCT 43.5  PLT 194  MCV 106.1*  MCH 32.9  MCHC 31.0  RDW 13.6  LYMPHSABS 1.2  MONOABS 0.6  EOSABS 0.0  BASOSABS 0.0   ------------------------------------------------------------------------------------------------------------------  Results for orders placed or performed during the hospital encounter of 08/17/18 (from the past 48 hour(s))  CBG monitoring, ED     Status: Abnormal   Collection Time: 08/17/18  2:18 PM  Result Value Ref Range   Glucose-Capillary 110 (H) 70 - 99 mg/dL  CBC     Status: Abnormal   Collection Time: 08/17/18  3:07 PM  Result Value Ref Range   WBC 7.6 4.0 - 10.5 K/uL   RBC 4.10 3.87 - 5.11 MIL/uL   Hemoglobin 13.5 12.0 - 15.0 g/dL   HCT 43.5 36.0 - 46.0 %   MCV 106.1 (H) 80.0 - 100.0 fL   MCH 32.9 26.0 - 34.0 pg   MCHC 31.0 30.0 - 36.0 g/dL   RDW 13.6 11.5 - 15.5 %   Platelets 194 150 - 400 K/uL   nRBC 0.0 0.0 - 0.2 %    Comment: Performed at [facility] [address] [id number] Differential     Status: None   Collection Time: 08/17/18  3:07 PM  Result Value Ref Range   Neutrophils Relative % 77 %   Neutro Abs 5.7 1.7 - 7.7 K/uL   Lymphocytes Relative 15 %   Lymphs Abs 1.2 0.7 - 4.0 K/uL   Monocytes Relative 8 %   Monocytes Absolute 0.6 0.1 - 1.0 K/uL   Eosinophils Relative 0 %   Eosinophils Absolute 0.0 0.0 - 0.5 K/uL   Basophils Relative 0 %   Basophils Absolute 0.0  0.0 - 0.1 K/uL   Immature Granulocytes 0 %   Abs Immature Granulocytes 0.02 0.00 - 0.07 K/uL    Comment: [facility]formed at [facility] [address] [id number] Comprehensive metabolic panel     Status: Abnormal   Collection Time: 08/17/18  3:07 PM  Result Value Ref Range   Sodium 142 135 - 145 mmol/L   Potassium 3.0 (L) 3.5 - 5.1 mmol/L   Chloride 104 98 - 111 mmol/L  CO2 26 22 - 32 mmol/L   Glucose, Bld 106 (H) 70 - 99 mg/dL   BUN 31 (H) 8 - 23 mg/dL   Creatinine, Ser 1.00 0.44 - 1.00 mg/dL   Calcium 8.4 (L) 8.9 - 10.3 mg/dL   Total Protein 7.1 6.5 - 8.1 g/dL   Albumin 3.6 3.5 - 5.0 g/dL   AST 20 15 - 41 U/L   ALT 17 0 - 44 U/L   Alkaline Phosphatase 98 38 - 126 U/L   Total Bilirubin 0.6 0.3 - 1.2 mg/dL   GFR calc non Af Amer 53 (L) >60 mL/min   GFR calc Af Amer >60 >60 mL/min   Anion gap 12 5 - 15    Comment: Performed at [facility], [address] [place], [place] [address]  Brain natriuretic peptide     Status: None   Collection Time: 08/17/18  3:07 PM  Result Value Ref Range   B Natriuretic Peptide 54.3 0.0 - 100.0 pg/mL    Comment: Performed at [facility], [address] [place], [place] [address]  Troponin I - ONCE - STAT     Status: None   Collection Time: 08/17/18  3:07 PM  Result Value Ref Range   Troponin I <0.03 <0.03 ng/mL    Comment: Performed at [facility], [address] [place], [place] [address]  Lipase, blood     Status: None   Collection Time: 08/17/18  3:07 PM  Result Value Ref Range   Lipase 31 11 - 51 U/L    Comment: Performed at [facility], [address] [place], [place] [address]  SARS Coronavirus 2 (Hosp order,Performed in [place] lab via [employer])     Status: None   Collection Time: 08/17/18  3:07 PM  Result Value Ref Range   SARS Coronavirus 2 ([employer] Now) NEGATIVE NEGATIVE    Comment: (NOTE) Interpretive Result Comment(s): COVID 19 Positive SARS CoV  2 target nucleic acids are DETECTED. The SARS CoV 2 RNA is generally detectable in upper and lower respiratory specimens during the acute phase of infection.  Positive results are indicative of active infection with SARS CoV 2.  Clinical correlation with patient history and other diagnostic information is necessary to determine patient infection status.  Positive results do not rule out bacterial infection or coinfection with other viruses. The expected result is Negative. COVID 19 Negative SARS CoV 2 target nucleic acids are NOT DETECTED. The SARS CoV 2 RNA is generally detectable in upper and lower respiratory specimens during the acute phase of infection.  Negative results do not preclude SARS CoV 2 infection, do not rule out coinfections with other pathogens, and should not be used as the sole basis for treatment or other patient management decisions.  Negative results must be combined with clinical  observations, patient history, and epidemiological information. The expected result is Negative. Invalid Presence or absence of SARS CoV 2 nucleic acids cannot be determined. Repeat testing was performed on the submitted specimen and repeated Invalid results were obtained.  If clinically indicated, additional testing on a new specimen with an alternate test methodology [id number]) is advised.  The SARS CoV 2 RNA is generally detectable in upper and lower respiratory specimens during the acute phase of infection. The expected result is Negative. Fact Sheet for Patients:  GolfingFamily.no Fact Sheet for Healthcare Providers: https://www.hernandez-brewer.com/ This test is not yet approved or cleared by the [place]  States FDA and has been authorized for detection and/or diagnosis of SARS CoV 2 by FDA under an Emergency Use Authorization (EUA).  This EUA will remain in effect (meaning this test can be used) for the duration of the COVID19 d eclaration  under Section 564(b)(1) of the Act, 21 U.S.C. section [phone] 3(b)(1), unless the authorization is terminated or revoked sooner. Performed at [facility], [address] [place], [place] [address]   APTT     Status: Abnormal   Collection Time: 08/17/18  3:55 PM  Result Value Ref Range   aPTT 43 (H) 24 - 36 seconds    Comment:        IF BASELINE aPTT IS ELEVATED, SUGGEST PATIENT RISK ASSESSMENT BE USED TO DETERMINE APPROPRIATE ANTICOAGULANT THERAPY. Performed at [facility], [address] [place], [place] [address]   Protime-INR     Status: Abnormal   Collection Time: 08/17/18  3:55 PM  Result Value Ref Range   Prothrombin Time 16.2 (H) 11.4 - 15.2 seconds   INR 1.3 (H) 0.8 - 1.2    Comment: (NOTE) INR goal varies based on device and disease states. Performed at [facility], [address] [place], [place] [address]     Chemistries  Recent Labs  Lab 08/17/18 1507  NA 142  K 3.0*  CL 104  CO2 26  GLUCOSE 106*  BUN 31*  CREATININE 1.00  CALCIUM 8.4*  AST 20  ALT 17  ALKPHOS 98  BILITOT 0.6   ------------------------------------------------------------------------------------------------------------------  ------------------------------------------------------------------------------------------------------------------ GFR: Estimated Creatinine Clearance: 39.7 mL/min (by C-G formula based on SCr of 1 mg/dL). Liver Function Tests: Recent Labs  Lab 08/17/18 1507  AST 20  ALT 17  ALKPHOS 98  BILITOT 0.6  PROT 7.1  ALBUMIN 3.6   Recent Labs  Lab 08/17/18 1507  LIPASE 31   No results for input(s): AMMONIA in the last 168 hours. Coagulation Profile: Recent Labs  Lab 08/17/18 1555  INR 1.3*   Cardiac Enzymes: Recent Labs  Lab 08/17/18 1507  TROPONINI <0.03   BNP (last 3 results) No results for input(s): PROBNP in the last 8760 hours. HbA1C: No results for input(s): HGBA1C in the last 72 hours. CBG: Recent  Labs  Lab 08/17/18 1418  GLUCAP 110*   Lipid Profile: No results for input(s): CHOL, HDL, LDLCALC, TRIG, CHOLHDL, LDLDIRECT in the last 72 hours. Thyroid Function Tests: No results for input(s): TSH, T4TOTAL, FREET4, T3FREE, THYROIDAB in the last 72 hours. Anemia Panel: No results for input(s): VITAMINB12, FOLATE, FERRITIN, TIBC, IRON, RETICCTPCT in the last 72 hours.  --------------------------------------------------------------------------------------------------------------- Urine analysis:    Component Value Date/Time   COLORURINE YELLOW 07/09/2018 2234   APPEARANCEUR CLEAR 07/09/2018 2234   LABSPEC 1.020 07/09/2018 2234   PHURINE 6.5 07/09/2018 [place] 07/09/2018 [place] 07/09/2018 2234   BILIRUBINUR NEGATIVE 07/09/2018 2234   KETONESUR NEGATIVE 07/09/2018 2234   PROTEINUR NEGATIVE 07/09/2018 2234   NITRITE NEGATIVE 07/09/2018 2234   LEUKOCYTESUR TRACE (A) 07/09/2018 2234      Imaging Results:    Ct Head Wo Contrast  Result Date: 08/17/2018 CLINICAL DATA:  Bilateral upper extremity weakness and slurred speech since last night. History of diabetes, syncope and myocardial infarction. EXAM: CT HEAD WITHOUT CONTRAST TECHNIQUE: Contiguous axial images were obtained from the base of the skull through the vertex without intravenous contrast. COMPARISON:  CT head 07/09/2018. FINDINGS: Brain: There is no evidence of  acute intracranial hemorrhage, mass lesion, brain edema or extra-axial fluid collection. There is stable mild atrophy with prominence of the ventricles and subarachnoid spaces. Chronic small vessel ischemic changes in the periventricular white matter appear unchanged. There is no CT evidence of acute cortical infarction. Vascular: Intracranial vascular calcifications. No hyperdense vessel identified. Skull: Negative for fracture or focal lesion. Sinuses/Orbits: The visualized paranasal sinuses and mastoid air cells are clear. Previous  bilateral lens surgery. No acute orbital abnormalities are seen. Other: None. IMPRESSION: Stable head CT without acute intracranial findings. Stable mild atrophy and chronic small vessel ischemic changes. Electronically Signed   By: Richardean Sale M.D.   On: 08/17/2018 16:26   Ct Abdomen Pelvis W Contrast  Result Date: 08/17/2018 CLINICAL DATA:  Epigastric pain with nausea. EXAM: CT ABDOMEN AND PELVIS WITH CONTRAST TECHNIQUE: Multidetector CT imaging of the abdomen and pelvis was performed using the standard protocol following bolus administration of intravenous contrast. CONTRAST:  122m OMNIPAQUE IOHEXOL 300 MG/ML  SOLN COMPARISON:  No comparison studies available. FINDINGS: Lower chest: Chronic interstitial changes noted in the lower lungs bilaterally. No pleural effusion. Hepatobiliary: No suspicious focal abnormality within the liver parenchyma. Gallbladder is surgically absent. No intrahepatic or extrahepatic biliary dilation. Pancreas: No focal mass lesion. No dilatation of the main duct. No intraparenchymal cyst. No peripancreatic edema. Spleen: No splenomegaly. No focal mass lesion. Adrenals/Urinary Tract: No adrenal nodule or mass. Right kidney unremarkable. 8 mm well-defined low-density lesion in the interpolar left kidney is likely a cyst. No evidence for hydroureter. The urinary bladder appears normal for the degree of distention. Stomach/Bowel: Status post gastric bypass. Duodenum is normally positioned as is the ligament of Treitz. No small bowel wall thickening. No small bowel dilatation. The terminal ileum is normal. The appendix is 5 mm diameter without evidence for periappendiceal edema or inflammation. No gross colonic mass. No colonic wall thickening. Diverticular changes are noted in the left colon without evidence of diverticulitis. Vascular/Lymphatic: There is abdominal aortic atherosclerosis without aneurysm. IVC filter visualized in situ. Portal vein, superior mesenteric vein, and  splenic vein are patent. There is no gastrohepatic or hepatoduodenal ligament lymphadenopathy. No intraperitoneal or retroperitoneal lymphadenopathy. No pelvic sidewall lymphadenopathy. Reproductive: The uterus is surgically absent. There is no adnexal mass. Other: No intraperitoneal free fluid. Musculoskeletal: Status post fixation in the femoral necks bilaterally. Old bilateral pubic rami fractures noted. Compression fractures evident at T12, L1, L2, L3, and L4. no ventral hernia. IMPRESSION: 1. No acute findings in the abdomen or pelvis. No findings to explain the patient's history of epigastric pain with nausea. 2.  Aortic Atherosclerois (ICD10-170.0) 3. Left colonic diverticulosis without diverticulitis. Electronically Signed   By: EMisty StanleyM.D.   On: 08/17/2018 16:30   Dg Chest Portable 1 View  Result Date: 08/17/2018 CLINICAL DATA:  Weakness, confusion since yesterday EXAM: PORTABLE CHEST 1 VIEW COMPARISON:  07/16/2016 FINDINGS: There is mild bilateral chronic interstitial thickening. There is no focal parenchymal opacity. There is no pleural effusion or pneumothorax. The cardiomediastinal silhouette is stable. There is a 3 lead cardiac pacemaker. The osseous structures are unremarkable. IMPRESSION: No active disease. Electronically Signed   By: HKathreen Devoid  On: 08/17/2018 16:33   EKG paced at 75, LAD, RBBB and LAFB   Assessment & Plan:    Principal Problem:   TIA (transient ischemic attack) Active Problems:   Hypokalemia   Persistent atrial fibrillation   Diabetes (HCC)   Chronic diastolic congestive heart failure (HCC)  Slurred speech, difficulty walking ?  Leg weakness  (TIA r/o CVA) Tele Check CTA brain/ neck Check cardiac echo Check hga1c, lipid Cont Eliquis pharmacy to dose PT/OT/Speech therapy to evaluate and tx Appears neurology consulted by ED, or [place],  Please consult neurology in AM to make sure  Hypokalemia  Replete Check cmp in am  Pafib H/o  pacer Tele Cont Amiodarone 23m po qday Cont Eliquis  H/o CVA/ posterior circulation TIA Cont Eliquis Cont Lipitor 274mpo qhs  Hypothyroidism Cont Levothyroxine 75 micrograms po qday  Asthma Cont Symbicort=> Dulera 2puff bid Cont albuterol HFA 2puff q6h prn   H/o osteoporosis Cont Calcitonin NS  H/o Anemia Cont Ferrous sulfate Check cbc in am  Gerd Cont PPI  Dm2 with neuropathy Cont Gabapentin   DVT Prophylaxis-   Eliquis  AM Labs Ordered, also please review Full Orders  Family Communication: Admission, patients condition and plan of care including tests being ordered have been discussed with the patient who indicate understanding and agree with the plan and Code Status.  Code Status:   DNR  Admission status: observation: Based on patients clinical presentation and evaluation of above clinical data, I have made determination that patient meets observation criteria at this time.  Time spent in minutes : 70   JaJani Gravel.D on 08/17/2018 at 8:57 PM

## 2018-08-17 NOTE — ED Notes (Signed)
Pt unable to use bedpan at this time.

## 2018-08-17 NOTE — ED Triage Notes (Addendum)
Pt c/o weakness to bilat UE and slurred speech started last night ~5pm-son states he spoke with her after 5pm and she "sounded normal"-pt NAD-pt to triage in w/c-son states he spoke to her at 10am and she "seemed tired/different"-pt lives alone and has an aide 6 hours/day-the aide arrived at 10am-son states he spoke to pt and aide at 130pm-pt's speech was slurred and aide stated pt "was leaning to the right"-pt is A/O-answering ?s approriately

## 2018-08-17 NOTE — ED Notes (Signed)
Family going home, left contact info:  Stormi Vandevelde [phone]

## 2018-08-17 NOTE — Progress Notes (Signed)
83 year old female with history of suspected posterior circulation TIA and orthostatic hypotension who presented with slurred speech, generalized weakness that started last night.  Patient lives alone and she was recently discharged home from rehab facility.  Her weakness became florid and the case discussed with Dr. Cheral Marker from neurology for neuro observation for now patient was noted to be dehydrated and hypokalemic.  Probably worsening of her posterior circulation TIA due to above.

## 2018-08-17 NOTE — ED Notes (Signed)
Report to [facility]. ETA 15 minutes  

## 2018-08-17 NOTE — ED Notes (Signed)
Patient transported to CT 

## 2018-08-17 NOTE — ED Notes (Signed)
Pt unable to provide urine specimen at this time.  Aware of need for urine collection.  IV fluids infusing.

## 2018-08-17 NOTE — ED Notes (Signed)
Message left for son that pt had been transferred to hospital.

## 2018-08-18 ENCOUNTER — Encounter: Payer: Medicare Other | Admitting: Physical Medicine & Rehabilitation

## 2018-08-18 ENCOUNTER — Observation Stay (HOSPITAL_COMMUNITY): Payer: Medicare Other

## 2018-08-18 DIAGNOSIS — M1990 Unspecified osteoarthritis, unspecified site: Secondary | ICD-10-CM | POA: Diagnosis present

## 2018-08-18 DIAGNOSIS — I361 Nonrheumatic tricuspid (valve) insufficiency: Secondary | ICD-10-CM | POA: Diagnosis not present

## 2018-08-18 DIAGNOSIS — R339 Retention of urine, unspecified: Secondary | ICD-10-CM | POA: Diagnosis present

## 2018-08-18 DIAGNOSIS — I2510 Atherosclerotic heart disease of native coronary artery without angina pectoris: Secondary | ICD-10-CM | POA: Diagnosis present

## 2018-08-18 DIAGNOSIS — R531 Weakness: Secondary | ICD-10-CM | POA: Diagnosis not present

## 2018-08-18 DIAGNOSIS — I959 Hypotension, unspecified: Secondary | ICD-10-CM | POA: Diagnosis present

## 2018-08-18 DIAGNOSIS — G459 Transient cerebral ischemic attack, unspecified: Secondary | ICD-10-CM | POA: Diagnosis present

## 2018-08-18 DIAGNOSIS — I371 Nonrheumatic pulmonary valve insufficiency: Secondary | ICD-10-CM | POA: Diagnosis not present

## 2018-08-18 DIAGNOSIS — I4819 Other persistent atrial fibrillation: Secondary | ICD-10-CM | POA: Diagnosis present

## 2018-08-18 DIAGNOSIS — E860 Dehydration: Secondary | ICD-10-CM | POA: Diagnosis present

## 2018-08-18 DIAGNOSIS — E876 Hypokalemia: Secondary | ICD-10-CM | POA: Diagnosis present

## 2018-08-18 DIAGNOSIS — R4781 Slurred speech: Secondary | ICD-10-CM | POA: Diagnosis present

## 2018-08-18 DIAGNOSIS — Z85828 Personal history of other malignant neoplasm of skin: Secondary | ICD-10-CM | POA: Diagnosis not present

## 2018-08-18 DIAGNOSIS — I110 Hypertensive heart disease with heart failure: Secondary | ICD-10-CM | POA: Diagnosis present

## 2018-08-18 DIAGNOSIS — Z1159 Encounter for screening for other viral diseases: Secondary | ICD-10-CM | POA: Diagnosis not present

## 2018-08-18 DIAGNOSIS — I252 Old myocardial infarction: Secondary | ICD-10-CM | POA: Diagnosis not present

## 2018-08-18 DIAGNOSIS — Z8673 Personal history of transient ischemic attack (TIA), and cerebral infarction without residual deficits: Secondary | ICD-10-CM | POA: Diagnosis not present

## 2018-08-18 DIAGNOSIS — E11319 Type 2 diabetes mellitus with unspecified diabetic retinopathy without macular edema: Secondary | ICD-10-CM | POA: Diagnosis present

## 2018-08-18 DIAGNOSIS — I480 Paroxysmal atrial fibrillation: Secondary | ICD-10-CM | POA: Diagnosis present

## 2018-08-18 DIAGNOSIS — E1142 Type 2 diabetes mellitus with diabetic polyneuropathy: Secondary | ICD-10-CM | POA: Diagnosis present

## 2018-08-18 DIAGNOSIS — E1136 Type 2 diabetes mellitus with diabetic cataract: Secondary | ICD-10-CM | POA: Diagnosis present

## 2018-08-18 DIAGNOSIS — Z66 Do not resuscitate: Secondary | ICD-10-CM | POA: Diagnosis present

## 2018-08-18 DIAGNOSIS — I5032 Chronic diastolic (congestive) heart failure: Secondary | ICD-10-CM

## 2018-08-18 DIAGNOSIS — Z90710 Acquired absence of both cervix and uterus: Secondary | ICD-10-CM | POA: Diagnosis not present

## 2018-08-18 DIAGNOSIS — J45909 Unspecified asthma, uncomplicated: Secondary | ICD-10-CM | POA: Diagnosis present

## 2018-08-18 DIAGNOSIS — E1140 Type 2 diabetes mellitus with diabetic neuropathy, unspecified: Secondary | ICD-10-CM | POA: Diagnosis not present

## 2018-08-18 DIAGNOSIS — E039 Hypothyroidism, unspecified: Secondary | ICD-10-CM | POA: Diagnosis present

## 2018-08-18 DIAGNOSIS — Z9842 Cataract extraction status, left eye: Secondary | ICD-10-CM | POA: Diagnosis not present

## 2018-08-18 LAB — COMPREHENSIVE METABOLIC PANEL
ALT: 14 U/L (ref 0–44)
AST: 19 U/L (ref 15–41)
Albumin: 2.7 g/dL — ABNORMAL LOW (ref 3.5–5.0)
Alkaline Phosphatase: 83 U/L (ref 38–126)
Anion gap: 7 (ref 5–15)
BUN: 24 mg/dL — ABNORMAL HIGH (ref 8–23)
CO2: 24 mmol/L (ref 22–32)
Calcium: 8 mg/dL — ABNORMAL LOW (ref 8.9–10.3)
Chloride: 110 mmol/L (ref 98–111)
Creatinine, Ser: 1 mg/dL (ref 0.44–1.00)
GFR calc Af Amer: >60 mL/min (ref >60)
GFR calc non Af Amer: 53 mL/min — ABNORMAL LOW (ref >60)
Glucose, Bld: 152 mg/dL — ABNORMAL HIGH (ref 70–99)
Potassium: 3.3 mmol/L — ABNORMAL LOW (ref 3.5–5.1)
Sodium: 141 mmol/L (ref 135–145)
Total Bilirubin: 0.5 mg/dL (ref 0.3–1.2)
Total Protein: 5.6 g/dL — ABNORMAL LOW (ref 6.5–8.1)

## 2018-08-18 LAB — URINALYSIS, ROUTINE W REFLEX MICROSCOPIC
Bilirubin Urine: NEGATIVE
Glucose, UA: NEGATIVE mg/dL
Hgb urine dipstick: NEGATIVE
Ketones, ur: NEGATIVE mg/dL
Leukocytes,Ua: NEGATIVE
Nitrite: NEGATIVE
Protein, ur: NEGATIVE mg/dL
Specific Gravity, Urine: >1.046 — ABNORMAL HIGH (ref 1.005–1.030)
pH: 6 (ref 5.0–8.0)

## 2018-08-18 LAB — LIPID PANEL
Cholesterol: 100 mg/dL (ref 0–200)
HDL: 48 mg/dL (ref >40)
LDL Cholesterol: 44 mg/dL (ref 0–99)
Total CHOL/HDL Ratio: 2.1 RATIO
Triglycerides: 38 mg/dL (ref <150)
VLDL: 8 mg/dL (ref 0–40)

## 2018-08-18 LAB — CBC
HCT: 37.5 % (ref 36.0–46.0)
Hemoglobin: 11.7 g/dL — ABNORMAL LOW (ref 12.0–15.0)
MCH: 33.3 pg (ref 26.0–34.0)
MCHC: 31.2 g/dL (ref 30.0–36.0)
MCV: 106.8 fL — ABNORMAL HIGH (ref 80.0–100.0)
Platelets: 172 10*3/uL (ref 150–400)
RBC: 3.51 MIL/uL — ABNORMAL LOW (ref 3.87–5.11)
RDW: 14 % (ref 11.5–15.5)
WBC: 6.3 10*3/uL (ref 4.0–10.5)
nRBC: 0 % (ref 0.0–0.2)

## 2018-08-18 LAB — HEMOGLOBIN A1C
Hgb A1c MFr Bld: 5.5 % (ref 4.8–5.6)
Mean Plasma Glucose: 111.15 mg/dL

## 2018-08-18 LAB — ECHOCARDIOGRAM COMPLETE
Height: 65 in
Weight: 2031.76 oz

## 2018-08-18 LAB — GLUCOSE, CAPILLARY
Glucose-Capillary: 92 mg/dL (ref 70–99)
Glucose-Capillary: 115 mg/dL — ABNORMAL HIGH (ref 70–99)

## 2018-08-18 LAB — CORTISOL-AM, BLOOD: Cortisol - AM: 14.1 ug/dL (ref 6.7–22.6)

## 2018-08-18 MED ORDER — INSULIN ASPART 100 UNIT/ML ~~LOC~~ SOLN: 0.0000 [IU] | Freq: Three times a day (TID) | SUBCUTANEOUS | Status: DC

## 2018-08-18 MED ORDER — ENSURE ENLIVE PO LIQD
237.0000 mL | Freq: Two times a day (BID) | ORAL | Status: DC
  Administered 2018-08-22: 10:00:00 237 mL via ORAL

## 2018-08-18 MED ORDER — SODIUM CHLORIDE 0.9 % IV BOLUS
500.0000 mL | Freq: Once | INTRAVENOUS | Status: AC
  Administered 2018-08-18: 500 mL via INTRAVENOUS

## 2018-08-18 MED ORDER — POTASSIUM CHLORIDE 10 MEQ/100ML IV SOLN
10.0000 meq | INTRAVENOUS | Status: AC
  Administered 2018-08-18 (×3): 10 meq via INTRAVENOUS
  Filled 2018-08-18 (×3): qty 100

## 2018-08-18 MED ORDER — SODIUM CHLORIDE 0.9 % IV SOLN
INTRAVENOUS | Status: DC
  Administered 2018-08-18 – 2018-08-20 (×4): via INTRAVENOUS

## 2018-08-18 MED ORDER — ACETAMINOPHEN 500 MG PO TABS
500.0000 mg | ORAL_TABLET | Freq: Once | ORAL | Status: AC
  Administered 2018-08-18: 500 mg via ORAL
  Filled 2018-08-18: qty 1

## 2018-08-18 MED ORDER — TAMSULOSIN HCL 0.4 MG PO CAPS
0.4000 mg | ORAL_CAPSULE | Freq: Every day | ORAL | Status: DC
  Administered 2018-08-18 – 2018-08-22 (×5): 0.4 mg via ORAL
  Filled 2018-08-18 (×5): qty 1

## 2018-08-18 NOTE — Evaluation (Signed)
Physical Therapy Evaluation Patient Details Name: Meredith Gonzalez MRN: [id number] DOB: 03/26/1935 Today's Date: 08/18/2018   History of Present Illness  Pt is a 83 y/o female presenting with c/o difficulty walking from B LE weakness and slurred speech. PMH: CAD, a fib, pacemaker 2012, CHF, CVA s/p R CEA (06/03/18), TIA, DM2 with retinopathy, HTN, fibromyalgia.   Clinical Impression  Pt admitted with above diagnosis. Pt currently with functional limitations due to the deficits listed below (see PT Problem List). At the time of PT eval pt was able to perform transfers with up to heavy mod/light max assist during face-to-face transfer. Pt very unsteady and tolerance for functional activity is low at this time. We were not able to progress to gait training at this time due to fatigue. Pt asking for CIR at d/c however do not feel she has the tolerance, and question a qualifying diagnosis as well. Feel SNF is the most appropriate at this time and pt agreeable if she can go to a different SNF than [facility]. Acutely, pt will benefit from skilled PT to increase their independence and safety with mobility to allow discharge to the venue listed below.       Follow Up Recommendations SNF;Supervision/Assistance - 24 hour    Equipment Recommendations  None recommended by PT    Recommendations for Other Services       Precautions / Restrictions Precautions Precautions: Fall;Back Precaution Booklet Issued: No(For comfort) Precaution Comments: many falls with Sept neck fracture, April 2020 hurt left hip with indeterminate age of L-spine fractures Required Braces or Orthoses: Spinal Brace Spinal Brace: Other (comment)(Appears for comfort only?? Questionable) Restrictions Weight Bearing Restrictions: No      Mobility  Bed Mobility Overal bed mobility: Needs Assistance Bed Mobility: Supine to Sit;Sit to Sidelying;Rolling Rolling: Supervision   Supine to sit: Min guard;HOB elevated   Sit to  sidelying: Min assist General bed mobility comments: Pt able to transition to EOB with min guard assist, however once sitting EOB had an anterior to R lateral LOB requiring min assist to correct.  Transfers Overall transfer level: Needs assistance Equipment used: 1 person hand held assist Transfers: Sit to/from [facility] Sit to Stand: Mod assist Stand pivot transfers: Mod assist;Max assist       General transfer comment: Pt holding to therapist for face-to-face transfer bed>chair. Pt took a few steps around to the chair and required mod to max assist for balance support and safety.   Ambulation/Gait             General Gait Details: Unable to progress to gait training today.   Stairs            Wheelchair Mobility    Modified Rankin (Stroke Patients Only)       Balance Overall balance assessment: Needs assistance Sitting-balance support: Single extremity supported;Feet supported Sitting balance-Leahy Scale: Fair Sitting balance - Comments: min guard for safety, preference to UE support   Standing balance support: Bilateral upper extremity supported;During functional activity Standing balance-Leahy Scale: Poor Standing balance comment: relaint on support                             Pertinent Vitals/Pain Pain Assessment: Faces Faces Pain Scale: Hurts even more Pain Location: head, L hip, lower back Pain Descriptors / Indicators: Aching;Sore Pain Intervention(s): Monitored during session    Home Living Family/patient expects to be discharged to:: Private residence Living Arrangements: Alone Available Help at  Discharge: Family;Available PRN/intermittently;Personal care attendant(PCA 6 hrs/day ) Type of Home: House Home Access: Level entry     Home Layout: One level Home Equipment: Walker - 4 wheels;Walker - 2 wheels;Bedside commode;Shower seat - built in;Grab bars - tub/shower;Adaptive equipment Additional Comments: Lives in  [facility]",     Prior Function Level of Independence: Independent with assistive device(s)         Comments: reports indepenent ADLs using AE, RW for mobility, aide assist with IADLs and med mgmt assist from son      Hand Dominance   Dominant Hand: Right    Extremity/Trunk Assessment   Upper Extremity Assessment Upper Extremity Assessment: LUE deficits/detail LUE Deficits / Details: Decreased grip and strength at elbow and shoulder - grossly 4-/5 compared to R which was 4 to 4+/5.     Lower Extremity Assessment Lower Extremity Assessment: LLE deficits/detail LLE Deficits / Details: Decreased strength in quads, hip flexors, and hamstrings - grossly 4-/5. Jerky movement when attempting MMT on L.  LLE Coordination: decreased gross motor;decreased fine motor       Communication   Communication: No difficulties  Cognition Arousal/Alertness: Awake/alert Behavior During Therapy: WFL for tasks assessed/performed Overall Cognitive Status: Within Functional Limits for tasks assessed                                 General Comments: appears [facility]       General Comments      Exercises     Assessment/Plan    PT Assessment Patient needs continued PT services  PT Problem List Decreased strength;Decreased activity tolerance;Decreased balance;Decreased mobility;Decreased knowledge of use of DME;Decreased safety awareness;Decreased knowledge of precautions;Pain       PT Treatment Interventions DME instruction;Gait training;Stair training;Functional mobility training;Therapeutic activities;Therapeutic exercise;Neuromuscular re-education;Patient/family education    PT Goals (Current goals can be found in the Care Plan section)  Acute Rehab PT Goals Patient Stated Goal: to get to rehab PT Goal Formulation: With patient Time For Goal Achievement: 09/01/18 Potential to Achieve Goals: Good    Frequency Min 3X/week   Barriers to discharge         Co-evaluation               AM-PAC PT "6 Clicks" Mobility  Outcome Measure Help needed turning from your back to your side while in a flat bed without using bedrails?: None Help needed moving from lying on your back to sitting on the side of a flat bed without using bedrails?: A Little Help needed moving to and from a bed to a chair (including a wheelchair)?: A Little Help needed standing up from a chair using your arms (e.g., wheelchair or bedside chair)?: A Little Help needed to walk in hospital room?: A Lot Help needed climbing 3-5 steps with a railing? : Total 6 Click Score: 16    End of Session Equipment Utilized During Treatment: Gait belt Activity Tolerance: Patient limited by pain;Patient limited by fatigue Patient left: in chair;with call bell/phone within reach;with chair alarm set;with nursing/sitter in room Nurse Communication: Mobility status PT Visit Diagnosis: Unsteadiness on feet (R26.81);Pain Pain - Right/Left: Left Pain - part of body: Hip(and back)    Time: 4742-5956 PT Time Calculation (min) (ACUTE ONLY): 27 min   Charges:   PT Evaluation $PT Eval Moderate Complexity: 1 Mod PT Treatments $Gait Training: 8-22 mins        [name], PT, DPT Acute Rehabilitation  Services Pager: [phone] Office: [phone]   [name] 08/18/2018, 10:04 AM

## 2018-08-18 NOTE — NC FL2 (Signed)
[place] LEVEL OF CARE SCREENING TOOL     IDENTIFICATION  Patient Name: Meredith Gonzalez Birthdate: Apr 30, 1935 Sex: female Admission Date (Current Location): 08/17/2018  [facility] and [place] Number:  Herbalist and Address:  [facility], [place] [address] [place], [place] [address]      Provider Number: [id number]  Attending Physician Name and Address:  Elmarie Shiley, MD  Relative Name and Phone Number:       Current Level of Care: Hospital Recommended Level of Care: [place] Prior Approval Number:    Date Approved/Denied:   PASRR Number: [id number]  Discharge Plan: SNF    Current Diagnoses: Patient Active Problem List   Diagnosis Date Noted  . Dizziness 07/09/2018  . Stroke due to embolism of carotid artery ([place]) 06/05/2018  . Stroke-like symptom   . Dyspnea   . Acute blood loss anemia   . Acute ischemic stroke ([place]) 05/31/2018  . Stroke ([place]) 05/30/2018  . Chronic diastolic congestive heart failure ([place])   . Anemia of chronic disease   . Hypoalbuminemia due to protein-calorie malnutrition ([place])   . Labile blood pressure   . Atrial fibrillation ([place])   . Chronic neck pain   . Subcortical infarction ([place]) 04/27/2018  . CVA (cerebral vascular accident) ([place]) 04/26/2018  . TIA (transient ischemic attack) 04/24/2018  . CAD (coronary artery disease) 08/07/2016  . (HFpEF) heart failure with preserved ejection fraction ([place]) 08/07/2016  . Ischemic cardiomyopathy 08/07/2016  . Chest pain   . NSTEMI (non-ST elevated myocardial infarction) ([place])   . Status post coronary artery stent placement   . Abnormal stress test   . SOB (shortness of breath)   . Multifocal pneumonia 07/13/2016  . Elevated troponin 07/13/2016  . Leukocytosis   . Pacemaker 05/17/2014  . Complications, pacemaker cardiac, mechanical 12/31/2012  . Edema extremities 10/08/2012  . Persistent atrial fibrillation 10/08/2012  .  Hypertension 10/08/2012  . Hypothyroidism 10/08/2012  . Neuropathy 10/08/2012  . Diabetes ([place]) 10/08/2012  . Depression 09/08/2012  . Hypokalemia 08/08/2012  . Anemia 08/08/2012  . Knee fracture, left 08/07/2012  . Recurrent falls 08/07/2012  . Syncope 08/07/2012  . SDH (subdural hematoma) ([place]) 08/07/2012    Orientation RESPIRATION BLADDER Height & Weight     Self, Time, Situation, Place  Normal Continent Weight: 126 lb 15.8 oz (57.6 kg) Height:  5\' 5"  (165.1 cm)  BEHAVIORAL SYMPTOMS/MOOD NEUROLOGICAL BOWEL NUTRITION STATUS      Continent Diet(see DC summary)  AMBULATORY STATUS COMMUNICATION OF NEEDS Skin   Limited Assist Verbally Skin abrasions(abrasions on back and buttocks, blister and skin tear on buttocks)                       Personal Care Assistance Level of Assistance  Bathing, Feeding, Dressing Bathing Assistance: Limited assistance Feeding assistance: Independent Dressing Assistance: Limited assistance     Functional Limitations Info  Sight, Hearing, Speech Sight Info: Impaired(blind right eye) Hearing Info: Adequate Speech Info: Adequate    SPECIAL CARE FACTORS FREQUENCY  PT (By licensed PT), OT (By licensed OT)     PT Frequency: 5x/wk OT Frequency: 5x/wk            Contractures Contractures Info: Not present    Additional Factors Info  Code Status, Allergies Code Status Info: DNR Allergies Info: Valium Diazepam, Robaxin Methocarbamol, Tramadol, Codeine, Darifenacin, Darvon Propoxyphene Hcl, Daypro Oxaprozin, Enablex Darifenacin Hydrobromide Er, Oxycodone, Propoxyphene, Risperdal Risperidone, Talwin Pentazocine,  Vicodin Hydrocodone-acetaminophen           Current Medications (08/18/2018):  This is the current hospital active medication list Current Facility-Administered Medications  Medication Dose Route Frequency Provider Last Rate Last Dose  . acetaminophen (TYLENOL) tablet 650 mg  650 mg Oral Q6H PRN Gardiner Barefoot, NP   650 mg  at 08/18/18 0808  . albuterol (VENTOLIN HFA) 108 (90 Base) MCG/ACT inhaler 2 puff  2 puff Inhalation Q6H PRN Jani Gravel, MD      . amiodarone (PACERONE) tablet 200 mg  200 mg Oral q morning - 10a Jani Gravel, MD   200 mg at 08/18/18 2542  . apixaban (ELIQUIS) tablet 2.5 mg  2.5 mg Oral BID Lyndee Leo, RPH   2.5 mg at 08/18/18 7062  . atorvastatin (LIPITOR) tablet 20 mg  20 mg Oral Daily Jani Gravel, MD   20 mg at 08/18/18 3762  . calcitonin (salmon) (MIACALCIN/FORTICAL) nasal spray 1 spray  1 spray Alternating Nares Daily Jani Gravel, MD   1 spray at 08/18/18 0957  . docusate sodium (COLACE) capsule 100 mg  100 mg Oral Daily Jani Gravel, MD   100 mg at 08/18/18 8315  . ferrous sulfate tablet 325 mg  325 mg Oral BID WC Jani Gravel, MD   325 mg at 08/18/18 1761  . gabapentin (NEURONTIN) capsule 300 mg  300 mg Oral q morning - 10a Jani Gravel, MD   300 mg at 08/18/18 6073  . gabapentin (NEURONTIN) capsule 600 mg  600 mg Oral QHS Jani Gravel, MD   600 mg at 08/17/18 2234  . levothyroxine (SYNTHROID) tablet 75 mcg  75 mcg Oral Q0600 Jani Gravel, MD   75 mcg at 08/18/18 0527  . mometasone-formoterol (DULERA) 200-5 MCG/ACT inhaler 2 puff  2 puff Inhalation BID Jani Gravel, MD   2 puff at 08/18/18 [phone]  . multivitamin with minerals tablet 1 tablet  1 tablet Oral Daily Jani Gravel, MD   1 tablet at 08/18/18 [phone]  . pantoprazole (PROTONIX) EC tablet 40 mg  40 mg Oral Daily Jani Gravel, MD   40 mg at 08/18/18 8546  . tamsulosin (FLOMAX) capsule 0.4 mg  0.4 mg Oral Daily Regalado, Belkys A, MD      . vitamin B-12 (CYANOCOBALAMIN) tablet 1,000 mcg  1,000 mcg Oral Daily Jani Gravel, MD   1,000 mcg at 08/18/18 0807     Discharge Medications: Please see discharge summary for a list of discharge medications.  Relevant Imaging Results:  Relevant Lab Results:   Additional Information SS#: [id number]  Geralynn Ochs, LCSW

## 2018-08-18 NOTE — Progress Notes (Signed)
Patient 83 year old female admitted from [facility] 08/17/18 .C/o difficulty walking and slurred speech alert and oriented to day month and year.. pt was not able to void when offered bedpan x 2, bladder scan done and noted 780 ml  NP Baltazar Najjar text paged awaiting a call back./new orders.  Past Medical History:  Diagnosis Date  . Anemia   . Anxiety   . Arthritis    "all over" (02/11/2018)  . Basal cell carcinoma    "left leg" (02/11/2018)  . Cervical spine fracture ([place]) 12/2017   "C1-2"  . CHF (congestive heart failure) ([place])   . Chronic bronchitis ([place])   . Chronic neck pain    "since I broke my neck 6-8 wk ago" (02/11/2018)  . Diabetic peripheral neuropathy ([place])   . Fibromyalgia   . Fracture of right humerus   . Generalized weakness   . Headache    "weekly" (02/11/2018)  . History of blood transfusion    "related to one of my femur surgeries" (02/11/2018)  . History of echocardiogram    Echo 8/18: EF 50-55, no RWMA, Gr 1 DD, calcified AV leaflets, MAC, trivial MR, mod LAE, PASP 37  . Hypertension   . Hypothyroidism   . Incontinence of urine   . Ischemic stroke ([place]) 2000   "lost part of the vision in my right eye" (02/11/2018)  . Major depression, chronic   . Myocardial infarction ([place]) 06/2016  . Pacemaker   . Recurrent falls   . Syncope and collapse   . Type 2 diabetes, diet controlled ([place])

## 2018-08-18 NOTE — Consult Note (Addendum)
Neurology Consultation  Reason for Consult: Abnormal speech and generalized weakness  Referring Physician: Dr. Tyrell Antonio  History is obtained from: Patient  HPI: Meredith Gonzalez is a 83 y.o. female with history of diabetes, syncope, recurrent falls, ischemic stroke in 2000 with some visual loss, hypertension, generalized weakness, fibromyalgia, diabetic neuropathy, congestive heart failure, and pacemaker.  Patient states that yesterday she woke up and noted that she felt slightly dizzy and off balance along with generalized weakness.  She also felt that her speech was off. For that reason patient came to the emergency department.  She cannot have an MRI due to her pacemaker.  Currently patient still feels as though she is generally weak and also she still feels as though her speech is off.  She denies any visual abnormalities, difficulty understanding or expressing herself.  Chart review: Patient was recently seen on 07/10/2018.  At that time chief complaint was weakness and loss of balance.  She also did not endorse any headache, focal weakness or visual changes at that time.  Family had stated that the patient had had 2 strokes in the recent past.  At that time neurological exam was normal.  CT head showed no acute stroke.  CTA of head and neck showed no LVO.  She also had a chronic type II dens fracture at that time. 2D echo on 05/31/2018 showed an EF of 60-65 with no source of emboli. LDL 31, hemoglobin A1c 5.1. She was on Eliquis for DVT.  No further stroke work-up was recommended.  Home medications include apixaban.    LKW: 08/17/2018 at approximately 10:00 PM tpa given?: no, out of time window with minimal symptoms Premorbid modified Rankin scale (mRS): 0  NIH stroke score: 0   ROS: A 14 point ROS was performed and is negative except as noted in the HPI.   Past Medical History:  Diagnosis Date  . Anemia   . Anxiety   . Arthritis    "all over" (02/11/2018)  . Basal cell carcinoma     "left leg" (02/11/2018)  . Cervical spine fracture ([place]) 12/2017   "C1-2"  . CHF (congestive heart failure) ([place])   . Chronic bronchitis ([place])   . Chronic neck pain    "since I broke my neck 6-8 wk ago" (02/11/2018)  . Diabetic peripheral neuropathy ([place])   . Fibromyalgia   . Fracture of right humerus   . Generalized weakness   . Headache    "weekly" (02/11/2018)  . History of blood transfusion    "related to one of my femur surgeries" (02/11/2018)  . History of echocardiogram    Echo 8/18: EF 50-55, no RWMA, Gr 1 DD, calcified AV leaflets, MAC, trivial MR, mod LAE, PASP 37  . Hypertension   . Hypothyroidism   . Incontinence of urine   . Ischemic stroke ([place]) 2000   "lost part of the vision in my right eye" (02/11/2018)  . Major depression, chronic   . Myocardial infarction ([place]) 06/2016  . Pacemaker   . Recurrent falls   . Syncope and collapse   . Type 2 diabetes, diet controlled ([place])     Family History  Problem Relation Age of Onset  . Congestive Heart Failure Mother   . Heart attack Father   . Alcoholism Father   . Alcoholism Sister   . Alcoholism Brother   . Alcoholism Brother   . Throat cancer Brother    Social History:   reports that she quit smoking about 46 years ago.  Her smoking use included cigarettes. She has a 2.00 pack-year smoking history. She has never used smokeless tobacco. She reports current alcohol use. She reports that she does not use drugs.  Medications  Current Facility-Administered Medications:  .  0.9 %  sodium chloride infusion, , Intravenous, Continuous, Regalado, Belkys A, MD .  acetaminophen (TYLENOL) tablet 650 mg, 650 mg, Oral, Q6H PRN, Gardiner Barefoot, NP, 650 mg at 08/18/18 3086 .  albuterol (VENTOLIN HFA) 108 (90 Base) MCG/ACT inhaler 2 puff, 2 puff, Inhalation, Q6H PRN, Jani Gravel, MD .  amiodarone (PACERONE) tablet 200 mg, 200 mg, Oral, q morning - 10a, Jani Gravel, MD, 200 mg at 08/18/18 5784 .  apixaban (ELIQUIS) tablet  2.5 mg, 2.5 mg, Oral, BID, Lyndee Leo, RPH, 2.5 mg at 08/18/18 6962 .  atorvastatin (LIPITOR) tablet 20 mg, 20 mg, Oral, Daily, Jani Gravel, MD, 20 mg at 08/18/18 0807 .  calcitonin (salmon) (MIACALCIN/FORTICAL) nasal spray 1 spray, 1 spray, Alternating Nares, Daily, Jani Gravel, MD, 1 spray at 08/18/18 0957 .  docusate sodium (COLACE) capsule 100 mg, 100 mg, Oral, Daily, Jani Gravel, MD, 100 mg at 08/18/18 9528 .  ferrous sulfate tablet 325 mg, 325 mg, Oral, BID WC, Jani Gravel, MD, 325 mg at 08/18/18 4132 .  gabapentin (NEURONTIN) capsule 300 mg, 300 mg, Oral, q morning - 10a, Jani Gravel, MD, 300 mg at 08/18/18 4401 .  gabapentin (NEURONTIN) capsule 600 mg, 600 mg, Oral, QHS, Jani Gravel, MD, 600 mg at 08/17/18 2234 .  levothyroxine (SYNTHROID) tablet 75 mcg, 75 mcg, Oral, Q0600, Jani Gravel, MD, 75 mcg at 08/18/18 0527 .  mometasone-formoterol (DULERA) 200-5 MCG/ACT inhaler 2 puff, 2 puff, Inhalation, BID, Jani Gravel, MD, 2 puff at 08/18/18 [phone] .  multivitamin with minerals tablet 1 tablet, 1 tablet, Oral, Daily, Jani Gravel, MD, 1 tablet at 08/18/18 [phone] .  pantoprazole (PROTONIX) EC tablet 40 mg, 40 mg, Oral, Daily, Jani Gravel, MD, 40 mg at 08/18/18 4403 .  tamsulosin (FLOMAX) capsule 0.4 mg, 0.4 mg, Oral, Daily, Regalado, Belkys A, MD, 0.4 mg at 08/18/18 1140 .  vitamin B-12 (CYANOCOBALAMIN) tablet 1,000 mcg, 1,000 mcg, Oral, Daily, Jani Gravel, MD, 1,000 mcg at 08/18/18 4742   Exam: Current vital signs: BP (!) 152/74 (BP Location: Left Arm)   Pulse 69   Temp 97.7 F (36.5 C) (Oral)   Resp (!) 24   Ht _0  (1.651 m)   Wt 57.6 kg   SpO2 94%   BMI 21.13 kg/m  Vital signs in last 24 hours: Temp:  [97.7 F (36.5 C)-98.5 F (36.9 C)] 97.7 F (36.5 C) (05/26 0821) Pulse Rate:  [65-85] 69 (05/26 0821) Resp:  [15-24] 24 (05/26 0821) BP: (94-152)/(41-78) 152/74 (05/26 0821) SpO2:  [94 %-100 %] 94 % (05/26 0821) Weight:  [51.3 kg-57.6 kg] 57.6 kg (05/25 2000)  Physical Exam   Constitutional: Appears well-developed and well-nourished.  Psych: Affect appropriate to situation Eyes: No scleral injection HENT: No OP obstrucion Head: Normocephalic.  Cardiovascular: Normal rate and regular rhythm.  Respiratory: Effort normal, non-labored breathing GI: Soft.  No distension. There is no tenderness.  Skin: WDI  Neuro: Mental Status: Patient is awake, alert, oriented to person, place, month, year, and situation. Patient is able to give a clear and coherent history. No signs of aphasia or neglect During exam patient's speech would fluctuate with frequent stuttering that had a non-physiological quality.  When she was distracted by discussion of her health issues and not having her  speech specifically addressed by the examiner, her speech reverted back to normal fluency, with no stuttering, word finding difficulty or paraphasia.  Cranial Nerves: II: Visual Fields are full. PERRL.  III,IV, VI: EOMI without ptosis or diplopia.  V: Facial sensation is symmetric to temperature VII: Facial movement is symmetric.  VIII: hearing is intact to voice X: Palat elevates symmetrically XI: Shoulder shrug is symmetric. XII: tongue is midline without atrophy or fasciculations.  Motor: Tone is normal. Bulk is normal. 5/5 strength was present in all four extremities.  Sensory: Sensation is symmetric to light touch, temperature sensation decreased from feet up to mid-calf bilaterally. Deep Tendon Reflexes: 2+ and symmetric in the biceps and patellae.  Plantars: Toes are downgoing bilaterally.  Cerebellar: FNF dysmetric bilaterally with left greater than right. HKS intact bilaterally  Labs I have reviewed labs in epic and the results pertinent to this consultation are:   CBC    Component Value Date/Time   WBC 6.3 08/18/2018 0334   RBC 3.51 (L) 08/18/2018 0334   HGB 11.7 (L) 08/18/2018 0334   HGB 10.2 (L) 05/19/2018 1612   HCT 37.5 08/18/2018 0334   HCT 32.2 (L) 05/19/2018  1612   PLT 172 08/18/2018 0334   PLT 286 05/19/2018 1612   MCV 106.8 (H) 08/18/2018 0334   MCV 91 05/19/2018 1612   MCH 33.3 08/18/2018 0334   MCHC 31.2 08/18/2018 0334   RDW 14.0 08/18/2018 0334   RDW 16.8 (H) 05/19/2018 1612   LYMPHSABS 1.2 08/17/2018 1507   MONOABS 0.6 08/17/2018 1507   EOSABS 0.0 08/17/2018 1507   BASOSABS 0.0 08/17/2018 1507    CMP     Component Value Date/Time   NA 141 08/18/2018 0334   NA 144 05/19/2018 1612   K 3.3 (L) 08/18/2018 0334   CL 110 08/18/2018 0334   CO2 24 08/18/2018 0334   GLUCOSE 152 (H) 08/18/2018 0334   BUN 24 (H) 08/18/2018 0334   BUN 15 05/19/2018 1612   CREATININE 1.00 08/18/2018 0334   CALCIUM 8.0 (L) 08/18/2018 0334   PROT 5.6 (L) 08/18/2018 0334   PROT 6.7 05/19/2018 1612   ALBUMIN 2.7 (L) 08/18/2018 0334   ALBUMIN 3.7 05/19/2018 1612   AST 19 08/18/2018 0334   ALT 14 08/18/2018 0334   ALKPHOS 83 08/18/2018 0334   BILITOT 0.5 08/18/2018 0334   BILITOT 0.4 05/19/2018 1612   GFRNONAA 53 (L) 08/18/2018 0334   GFRAA >60 08/18/2018 0334    Lipid Panel     Component Value Date/Time   CHOL 100 08/18/2018 0334   CHOL 95 (L) 03/20/2017 0728   TRIG 38 08/18/2018 0334   HDL 48 08/18/2018 0334   HDL 58 03/20/2017 0728   CHOLHDL 2.1 08/18/2018 0334   VLDL 8 08/18/2018 0334   LDLCALC 44 08/18/2018 0334   LDLCALC 28 03/20/2017 0728     Imaging I have reviewed the images obtained:  CT-scan of the brain- stable head CT without acute intracranial findings.  Stable mild atrophy and chronic small vessel ischemic changes  CTA of head and neck:  1. Stable CTA of the head and neck. No large vessel occlusion or other acute vascular abnormality. 2. Approximate 50% atheromatous stenosis at the origin of the left ICA, stable. 3. Widely patent right carotid endarterectomy. 4. 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  Echocardiogram: 1.  The left ventricle has normal systolic function with an ejection  fraction of 60-65%. The cavity size was normal. There is mild concentric left ventricular hypertrophy. Left ventricular diastolic Doppler parameters are consistent with pseudonormalization. Elevated mean left atrial pressure No evidence of left ventricular regional wall motion abnormalities.  2. The right ventricle has normal systolic function. The cavity was normal. There is no increase in right ventricular wall thickness. Right ventricular systolic pressure is normal with an estimated pressure of 26.2 mmHg.  3. Left atrial size was mildly dilated.  4. Tricuspid valve regurgitation is mild-moderate.  5. The aortic valve is tricuspid. Mild thickening of the aortic valve. Mild calcification of the aortic valve.  Etta Quill PA-C [facility] [phone] 08/18/2018, 11:45 AM    Assessment: 83 year old female presenting with subjective dizziness, generalized weakness, and abnormal speech.   1. During today's exam, stuttering speech most consistent with an embellished pattern was noted. This resolved with distraction. Strength was 5/5 throughout. CT did not show any acute infarct. 2. Stable CTA of the head and neck with no large vessel occlusion or other acute vascular abnormality. There is approximate 50% atheromatous stenosis at the origin of the left ICA, which is stable, as well as widely patent right carotid endarterectomy. 3. Echocardiogram report reviewed. No mural thrombus or valvular vegetation described.  4. Unable to obtain MRI due to pacemaker.   Recommendations: - Patient should have a follow-up CT of head in 48 to 72 hours. - Continue Eliquis - Fall precautions -- Cardiac telemetry -- Speech consult  I have interviewed and examined the patient. 83 year old female presenting with subjective symptoms and embellished stuttering on Neurological exam. Imaging findings are stable. Continue Eliquis. Obtain speech  consult. Cardiac telemetry. Repeat CT head in 48-72 hours.  Electronically signed: Dr. Kerney Elbe

## 2018-08-18 NOTE — TOC Initial Note (Signed)
Transition of Care [facility]) - Initial/Assessment Note    Patient Details  Name: Meredith Gonzalez MRN: [id number] Date of Birth: 20-Oct-1935  [facility] CM/SW Contact:    Geralynn Ochs, LCSW Phone Number: 08/18/2018, 11:03 AM  Clinical Narrative:  Patient from home, recent SNF stay after last admission. CSW met with patient to discuss recommendation for SNF. Patient said she is agreeable, was hopeful for CIR but also agreeable to SNF. Patient said she just doesn't want to go back to [facility], that's where she discharged to last time. Patient asked that CSW call and discuss offers with her son, Lennette Bihari. CSW faxed out referral, will follow.                 Expected Discharge Plan: Skilled Nursing Facility Barriers to Discharge: Continued Medical Work up   Patient Goals and CMS Choice Patient states their goals for this hospitalization and ongoing recovery are:: to get to rehab CMS Medicare.gov Compare Post Acute Care list provided to:: Patient Choice offered to / list presented to : Patient  Expected Discharge Plan and Services Expected Discharge Plan: [place] Choice: [place] Living arrangements for the past 2 months: Single Family Home, [place]                                      Prior Living Arrangements/Services Living arrangements for the past 2 months: [place], [place] Lives with:: Self Patient language and need for interpreter reviewed:: No Do you feel safe going back to the place where you live?: Yes      Need for Family Participation in Patient Care: Yes (Comment) Care giver support system in place?: No (comment)   Criminal Activity/Legal Involvement Pertinent to Current Situation/Hospitalization: No - Comment as needed  Activities of Daily Living Home Assistive Devices/Equipment: Brace (specify type), C-collar ADL Screening (condition at time of  admission) Patient's cognitive ability adequate to safely complete daily activities?: Yes Is the patient deaf or have difficulty hearing?: No Does the patient have difficulty seeing, even when wearing glasses/contacts?: Yes Does the patient have difficulty concentrating, remembering, or making decisions?: No Patient able to express need for assistance with ADLs?: No Does the patient have difficulty dressing or bathing?: No Independently performs ADLs?: Yes (appropriate for developmental age) Communication: Independent Dressing (OT): Independent, Needs assistance Is this a change from baseline?: Change from baseline, expected to last <3days Grooming: Needs assistance Is this a change from baseline?: Pre-admission baseline Feeding: Independent, Needs assistance Is this a change from baseline?: Change from baseline, expected to last <3 days Bathing: Independent Toileting: Needs assistance Is this a change from baseline?: Change from baseline, expected to last <3 days In/Out Bed: Needs assistance Walks in Home: Independent Does the patient have difficulty walking or climbing stairs?: No Weakness of Legs: Both Weakness of Arms/Hands: None  Permission Sought/Granted Permission sought to share information with : Family Supports, Chartered certified accountant granted to share information with : Yes, Verbal Permission Granted  Share Information with NAME: Lennette Bihari  Permission granted to share info w AGENCY: SNF  Permission granted to share info w Relationship: Son     Emotional Assessment Appearance:: Appears stated age Attitude/Demeanor/Rapport: Engaged Affect (typically observed): Pleasant Orientation: : Oriented to Self, Oriented to Place, Oriented to  Time, Oriented to Situation Alcohol / Substance Use: Not  Applicable Psych Involvement: No (comment)  Admission diagnosis:  Dehydration [E86.0] Hypokalemia [E87.6] Slurred speech [R47.81] Generalized weakness  [R53.1] Patient Active Problem List   Diagnosis Date Noted  . Dizziness 07/09/2018  . Stroke due to embolism of carotid artery ([place]) 06/05/2018  . Stroke-like symptom   . Dyspnea   . Acute blood loss anemia   . Acute ischemic stroke ([place]) 05/31/2018  . Stroke ([place]) 05/30/2018  . Chronic diastolic congestive heart failure ([place])   . Anemia of chronic disease   . Hypoalbuminemia due to protein-calorie malnutrition ([place])   . Labile blood pressure   . Atrial fibrillation ([place])   . Chronic neck pain   . Subcortical infarction ([place]) 04/27/2018  . CVA (cerebral vascular accident) ([place]) 04/26/2018  . TIA (transient ischemic attack) 04/24/2018  . CAD (coronary artery disease) 08/07/2016  . (HFpEF) heart failure with preserved ejection fraction ([place]) 08/07/2016  . Ischemic cardiomyopathy 08/07/2016  . Chest pain   . NSTEMI (non-ST elevated myocardial infarction) ([place])   . Status post coronary artery stent placement   . Abnormal stress test   . SOB (shortness of breath)   . Multifocal pneumonia 07/13/2016  . Elevated troponin 07/13/2016  . Leukocytosis   . Pacemaker 05/17/2014  . Complications, pacemaker cardiac, mechanical 12/31/2012  . Edema extremities 10/08/2012  . Persistent atrial fibrillation 10/08/2012  . Hypertension 10/08/2012  . Hypothyroidism 10/08/2012  . Neuropathy 10/08/2012  . Diabetes ([place]) 10/08/2012  . Depression 09/08/2012  . Hypokalemia 08/08/2012  . Anemia 08/08/2012  . Knee fracture, left 08/07/2012  . Recurrent falls 08/07/2012  . Syncope 08/07/2012  . SDH (subdural hematoma) ([place]) 08/07/2012   PCP:  Sinclair Ship, MD Pharmacy:   [id number], [place] [address] [place] [address] Phone: [phone] Fax: [phone]  [id number] [place] [facility] [address] [address] [id number]:  [phone]: [phone]    Social Determinants of Health (SDOH) Interventions    Readmission Risk Interventions No flowsheet data found.

## 2018-08-18 NOTE — Progress Notes (Signed)
PROGRESS NOTE    Meredith Gonzalez  RJJ:[id number] DOB: 03-18-1936 DOA: 08/17/2018 PCP: Sinclair Ship, MD   Brief Narrative: 83 year old with past medical history significant for coronary artery disease a status post PCI a stent, paroxysmal A. fib on apixaban, history of basilar 2012, CHF, CVA status post a right CEA, posterior circulation TIA/16/20, diabetes with retinopathy, hypothyroidism no who presented with apparently difficulty walking secondary to bilateral leg weakness and slow respirations. Evaluation CT head stable atrophy no new finding.  CT abdomen and pelvis no acute finding in the abdomen.  Patient was noted to be hypotensive overnight 5/26 with blood pressure in the 100 range, she received IV bolus.  He also developed urinary retention overnight 5/26. Neurology was consulted for further evaluation.  Assessment & Plan:   Principal Problem:   TIA (transient ischemic attack) Active Problems:   Hypokalemia   Persistent atrial fibrillation   Diabetes (HCC)   Chronic diastolic congestive heart failure (HCC)  1-Neuro deficit, slurred speech, difficulty walking lower extremity weakness TIA rule out a stroke. She was evaluated by neurology they think that the symptoms are functional. Patient need a CT head in 48 to 72 hours an outpatient. CT angio head, neck; Stable CTA of the head and neck. No large vessel occlusion or other acute vascular abnormality. Approximate 50% atheromatous stenosis at the origin of the left ICA, stable. Widely patent right carotid endarterectomy. ECHO normal EF.   Hypotension.  Dehydration Patient with low BP last night SBP at 94. Improved with IV bolus.  Will continue IV fluids today.  Will check TSH and cortisol level.  Patient hemoglobin on admission at 13, prior hemoglobin at 10.  Likely hemoconcentration from dehydration.  P A fib; continue with amiodarone and eliquis.   H/o CVA posterior circulation; on eliquis.  Hypothyroidism; continue with  synthroid.  Check TSH.  DM type 2 with neuropathy;  Will order SSI.   Asthma; PRN albuterol  Urinary retention; monitor urine out put.  Start Flomax. Check ua to evaluate for UTI.   Hypokalemia;  Replete IV.   Headaches; tylenol PRN.   Estimated body mass index is 21.13 kg/m as calculated from the following:   Height as of this encounter: 5\' 5"  (1.651 m).   Weight as of this encounter: 57.6 kg.   DVT prophylaxis: eliquis Code Status:dnr Family Communication:  Disposition Plan: Remain in the hospital today for IV fluids, monitor blood pressure, patient blood pressure was low earlier in the morning.    Consultants:   Neurology   Procedures:  ECHO; The left ventricle has normal systolic function with an ejection fraction of 60-65%. The cavity size was normal. There is mild concentric left ventricular hypertrophy. Left ventricular diastolic Doppler parameters are consistent with  pseudonormalization. Elevated mean left atrial pressure No evidence of left ventricular regional wall motion abnormalities.  2. The right ventricle has normal systolic function. The cavity was normal. There is no increase in right ventricular wall thickness. Right ventricular systolic pressure is normal with an estimated pressure of 26.2 mmHg.  3. Left atrial size was mildly dilated.  4. Tricuspid valve regurgitation is mild-moderate.  5. The aortic valve is tricuspid. Mild thickening of the aortic valve. Mild calcification of the aortic valve.     Antimicrobials:   none   Subjective: Per son, he think patient has not been drinking enough fluids at home.  Patient has a prior history of dehydration.  He is frustrated with recurrent  admissions and recurrent dehydration. Marland Kitchen  I  offer him palliative care evaluation at the skilled nursing facility.  Monitor oral intake, and also had urinary retention. Need to encourage patient to eat and drink Objective: Vitals:   08/18/18 0645 08/18/18 0806  08/18/18 0817 08/18/18 0821  BP: 135/62 (!) 143/60  (!) 152/74  Pulse: 70 68  69  Resp: 15 18  (!) 24  Temp: 97.7 F (36.5 C)   97.7 F (36.5 C)  TempSrc: Oral   Oral  SpO2:  100% 95% 94%  Weight:      Height:        Intake/Output Summary (Last 24 hours) at 08/18/2018 0941 Last data filed at 08/18/2018 2952 Gross per 24 hour  Intake 1197.08 ml  Output 750 ml  Net 447.08 ml   Filed Weights   08/17/18 1418 08/17/18 2000  Weight: 51.3 kg 57.6 kg    Examination:  General exam: Appears calm and comfortable  Respiratory system: Clear to auscultation. Respiratory effort normal. Cardiovascular system: S1 & S2 heard, RRR. No JVD, murmurs, rubs, gallops or clicks. No pedal edema. Gastrointestinal system: Abdomen is nondistended, soft and nontender. No organomegaly or masses felt. Normal bowel sounds heard. Central nervous system: Alert and oriented. Speech is more clear, motor strengthen right 5/5 left 4-5/5 Extremities: Symmetric 5 x 5 power. Skin: No rashes, lesions or ulcers    Data Reviewed: I have personally reviewed following labs and imaging studies  CBC: Recent Labs  Lab 08/17/18 1507 08/18/18 0334  WBC 7.6 6.3  NEUTROABS 5.7  --   HGB 13.5 11.7*  HCT 43.5 37.5  MCV 106.1* 106.8*  PLT 194 841   Basic Metabolic Panel: Recent Labs  Lab 08/17/18 1507 08/18/18 0334  NA 142 141  K 3.0* 3.3*  CL 104 110  CO2 26 24  GLUCOSE 106* 152*  BUN 31* 24*  CREATININE 1.00 1.00  CALCIUM 8.4* 8.0*   GFR: Estimated Creatinine Clearance: 39.7 mL/min (by C-G formula based on SCr of 1 mg/dL). Liver Function Tests: Recent Labs  Lab 08/17/18 1507 08/18/18 0334  AST 20 19  ALT 17 14  ALKPHOS 98 83  BILITOT 0.6 0.5  PROT 7.1 5.6*  ALBUMIN 3.6 2.7*   Recent Labs  Lab 08/17/18 1507  LIPASE 31   No results for input(s): AMMONIA in the last 168 hours. Coagulation Profile: Recent Labs  Lab 08/17/18 1555  INR 1.3*   Cardiac Enzymes: Recent Labs  Lab  08/17/18 1507  TROPONINI <0.03   BNP (last 3 results) No results for input(s): PROBNP in the last 8760 hours. HbA1C: No results for input(s): HGBA1C in the last 72 hours. CBG: Recent Labs  Lab 08/17/18 1418  GLUCAP 110*   Lipid Profile: Recent Labs    08/18/18 0334  CHOL 100  HDL 48  LDLCALC 44  TRIG 38  CHOLHDL 2.1   Thyroid Function Tests: No results for input(s): TSH, T4TOTAL, FREET4, T3FREE, THYROIDAB in the last 72 hours. Anemia Panel: No results for input(s): VITAMINB12, FOLATE, FERRITIN, TIBC, IRON, RETICCTPCT in the last 72 hours. Sepsis Labs: No results for input(s): PROCALCITON, LATICACIDVEN in the last 168 hours.  Recent Results (from the past 240 hour(s))  SARS Coronavirus 2 (Hosp order,Performed in [facility] lab via [employer] [facility])     Status: None   Collection Time: 08/17/18  3:07 PM  Result Value Ref Range Status   SARS Coronavirus 2 ([employer] [facility] Now) NEGATIVE NEGATIVE Final    Comment: (NOTE) Interpretive Result Comment(s): COVID 19 Positive SARS CoV  2 target nucleic acids are DETECTED. The SARS CoV 2 RNA is generally detectable in upper and lower respiratory specimens during the acute phase of infection.  Positive results are indicative of active infection with SARS CoV 2.  Clinical correlation with patient history and other diagnostic information is necessary to determine patient infection status.  Positive results do not rule out bacterial infection or coinfection with other viruses. The expected result is Negative. COVID 19 Negative SARS CoV 2 target nucleic acids are NOT DETECTED. The SARS CoV 2 RNA is generally detectable in upper and lower respiratory specimens during the acute phase of infection.  Negative results do not preclude SARS CoV 2 infection, do not rule out coinfections with other pathogens, and should not be used as the sole basis for treatment or other patient management decisions.  Negative results must be combined with clinical   observations, patient history, and epidemiological information. The expected result is Negative. Invalid Presence or absence of SARS CoV 2 nucleic acids cannot be determined. Repeat testing was performed on the submitted specimen and repeated Invalid results were obtained.  If clinically indicated, additional testing on a new specimen with an alternate test methodology [phone]) is advised.  The SARS CoV 2 RNA is generally detectable in upper and lower respiratory specimens during the acute phase of infection. The expected result is Negative. Fact Sheet for Patients:  GolfingFamily.no Fact Sheet for Healthcare Providers: https://www.hernandez-brewer.com/ This test is not yet approved or cleared by the [place] FDA and has been authorized for detection and/or diagnosis of SARS CoV 2 by FDA under an Emergency Use Authorization (EUA).  This EUA will remain in effect (meaning this test can be used) for the duration of the COVID19 d eclaration under Section 564(b)(1) of the Act, 21 U.S.C. section [phone] 3(b)(1), unless the authorization is terminated or revoked sooner. Performed at [facility], [address] [place], [place] [address]          Radiology Studies: Ct Angio Head W Or Wo Contrast  Result Date: 08/18/2018 CLINICAL DATA:  Initial evaluation for acute slurred speech, difficulty walking. EXAM: CT ANGIOGRAPHY HEAD AND NECK TECHNIQUE: Multidetector CT imaging of the head and neck was performed using the standard protocol during bolus administration of intravenous contrast. Multiplanar CT image reconstructions and MIPs were obtained to evaluate the vascular anatomy. Carotid stenosis measurements (when applicable) are obtained utilizing NASCET criteria, using the distal internal carotid diameter as the denominator. CONTRAST:  26mL OMNIPAQUE IOHEXOL 350 MG/ML SOLN COMPARISON:  Prior head CT from earlier same day. FINDINGS: CTA  NECK FINDINGS Aortic arch: Visualized aortic arch of normal caliber with normal 3 vessel morphology. Mild atheromatous change about the arch and origin of the great vessels without flow-limiting stenosis. Visualized subclavian arteries widely patent. Right carotid system: Right common and internal carotid arteries widely patent without stenosis, dissection, or occlusion. Widely patent right endarterectomy. Left carotid system: Left common carotid artery tortuous but widely patent to the bifurcation. Bulky calcified plaque about the left bifurcation with associated stenosis of up to 50% by NASCET criteria, stable. No made of approximate 50% stenosis at the origin of the left external carotid artery as well. Left ICA widely patent distally to the skull base without stenosis, dissection or occlusion. Vertebral arteries: Both vertebral arteries arise from the subclavian arteries. Left vertebral artery dominant. Vertebral arteries widely patent within the neck without stenosis, dissection or occlusion. 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 Associated chronic fractures  through the anterior and posterior arches of C1 noted as well. Prior fusion at C5-C7. No acute osseous abnormality. No worrisome osseous lesions. Patient is edentulous. Other neck: No other acute soft tissue abnormality within the neck. Upper chest: Chronic irregular pleuroparenchymal thickening with architectural distortion involving the right greater than left upper lobes noted, stable from previous. Partially visualized upper chest demonstrates no acute finding. Review of the MIP images confirms the above findings CTA HEAD FINDINGS Anterior circulation: Petrous segments widely patent. Mild atheromatous plaque within the cavernous/supraclinoid ICAs without hemodynamically significant stenosis. A1 segments widely patent. Normal anterior communicating artery. Anterior cerebral arteries widely patent to their distal aspects. No M1  stenosis or occlusion. Normal MCA bifurcations. Distal MCA branches well perfused and symmetric. Posterior circulation: Vertebral arteries widely patent to the vertebrobasilar junction without stenosis. Posterior inferior cerebral arteries patent bilaterally. Basilar widely patent to its distal aspect. Superior cerebellar and posterior cerebral arteries widely patent bilaterally. Venous sinuses: Patent. Anatomic variants: None significant. Delayed phase: No abnormal enhancement. Review of the MIP images confirms the above findings IMPRESSION: 1. Stable CTA of the head and neck. No large vessel occlusion or other acute vascular abnormality. 2. Approximate 50% atheromatous stenosis at the origin of the left ICA, stable. 3. Widely patent right carotid endarterectomy. 4. 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 Electronically Signed   By: Jeannine Boga M.D.   On: 08/18/2018 01:59   Ct Head Wo Contrast  Result Date: 08/17/2018 CLINICAL DATA:  Bilateral upper extremity weakness and slurred speech since last night. History of diabetes, syncope and myocardial infarction. EXAM: CT HEAD WITHOUT CONTRAST TECHNIQUE: Contiguous axial images were obtained from the base of the skull through the vertex without intravenous contrast. COMPARISON:  CT head 07/09/2018. FINDINGS: Brain: There is no evidence of acute intracranial hemorrhage, mass lesion, brain edema or extra-axial fluid collection. There is stable mild atrophy with prominence of the ventricles and subarachnoid spaces. Chronic small vessel ischemic changes in the periventricular white matter appear unchanged. There is no CT evidence of acute cortical infarction. Vascular: Intracranial vascular calcifications. No hyperdense vessel identified. Skull: Negative for fracture or focal lesion. Sinuses/Orbits: The visualized paranasal sinuses and mastoid air cells are clear.  Previous bilateral lens surgery. No acute orbital abnormalities are seen. Other: None. IMPRESSION: Stable head CT without acute intracranial findings. Stable mild atrophy and chronic small vessel ischemic changes. Electronically Signed   By: Richardean Sale M.D.   On: 08/17/2018 16:26   Ct Angio Neck W Or Wo Contrast  Result Date: 08/18/2018 CLINICAL DATA:  Initial evaluation for acute slurred speech, difficulty walking. EXAM: CT ANGIOGRAPHY HEAD AND NECK TECHNIQUE: Multidetector CT imaging of the head and neck was performed using the standard protocol during bolus administration of intravenous contrast. Multiplanar CT image reconstructions and MIPs were obtained to evaluate the vascular anatomy. Carotid stenosis measurements (when applicable) are obtained utilizing NASCET criteria, using the distal internal carotid diameter as the denominator. CONTRAST:  76mL OMNIPAQUE IOHEXOL 350 MG/ML SOLN COMPARISON:  Prior head CT from earlier same day. FINDINGS: CTA NECK FINDINGS Aortic arch: Visualized aortic arch of normal caliber with normal 3 vessel morphology. Mild atheromatous change about the arch and origin of the great vessels without flow-limiting stenosis. Visualized subclavian arteries widely patent. Right carotid system: Right common and internal carotid arteries widely patent without stenosis, dissection, or occlusion. Widely patent right endarterectomy. Left carotid system: Left common carotid artery tortuous  but widely patent to the bifurcation. Bulky calcified plaque about the left bifurcation with associated stenosis of up to 50% by NASCET criteria, stable. No made of approximate 50% stenosis at the origin of the left external carotid artery as well. Left ICA widely patent distally to the skull base without stenosis, dissection or occlusion. Vertebral arteries: Both vertebral arteries arise from the subclavian arteries. Left vertebral artery dominant. Vertebral arteries widely patent within the neck  without stenosis, dissection or occlusion. 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 Associated chronic fractures through the anterior and posterior arches of C1 noted as well. Prior fusion at C5-C7. No acute osseous abnormality. No worrisome osseous lesions. Patient is edentulous. Other neck: No other acute soft tissue abnormality within the neck. Upper chest: Chronic irregular pleuroparenchymal thickening with architectural distortion involving the right greater than left upper lobes noted, stable from previous. Partially visualized upper chest demonstrates no acute finding. Review of the MIP images confirms the above findings CTA HEAD FINDINGS Anterior circulation: Petrous segments widely patent. Mild atheromatous plaque within the cavernous/supraclinoid ICAs without hemodynamically significant stenosis. A1 segments widely patent. Normal anterior communicating artery. Anterior cerebral arteries widely patent to their distal aspects. No M1 stenosis or occlusion. Normal MCA bifurcations. Distal MCA branches well perfused and symmetric. Posterior circulation: Vertebral arteries widely patent to the vertebrobasilar junction without stenosis. Posterior inferior cerebral arteries patent bilaterally. Basilar widely patent to its distal aspect. Superior cerebellar and posterior cerebral arteries widely patent bilaterally. Venous sinuses: Patent. Anatomic variants: None significant. Delayed phase: No abnormal enhancement. Review of the MIP images confirms the above findings IMPRESSION: 1. Stable CTA of the head and neck. No large vessel occlusion or other acute vascular abnormality. 2. Approximate 50% atheromatous stenosis at the origin of the left ICA, stable. 3. Widely patent right carotid endarterectomy. 4. 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 Electronically Signed   By: Jeannine Boga M.D.    On: 08/18/2018 01:59   Ct Abdomen Pelvis W Contrast  Result Date: 08/17/2018 CLINICAL DATA:  Epigastric pain with nausea. EXAM: CT ABDOMEN AND PELVIS WITH CONTRAST TECHNIQUE: Multidetector CT imaging of the abdomen and pelvis was performed using the standard protocol following bolus administration of intravenous contrast. CONTRAST:  111mL OMNIPAQUE IOHEXOL 300 MG/ML  SOLN COMPARISON:  No comparison studies available. FINDINGS: Lower chest: Chronic interstitial changes noted in the lower lungs bilaterally. No pleural effusion. Hepatobiliary: No suspicious focal abnormality within the liver parenchyma. Gallbladder is surgically absent. No intrahepatic or extrahepatic biliary dilation. Pancreas: No focal mass lesion. No dilatation of the main duct. No intraparenchymal cyst. No peripancreatic edema. Spleen: No splenomegaly. No focal mass lesion. Adrenals/Urinary Tract: No adrenal nodule or mass. Right kidney unremarkable. 8 mm well-defined low-density lesion in the interpolar left kidney is likely a cyst. No evidence for hydroureter. The urinary bladder appears normal for the degree of distention. Stomach/Bowel: Status post gastric bypass. Duodenum is normally positioned as is the ligament of Treitz. No small bowel wall thickening. No small bowel dilatation. The terminal ileum is normal. The appendix is 5 mm diameter without evidence for periappendiceal edema or inflammation. No gross colonic mass. No colonic wall thickening. Diverticular changes are noted in the left colon without evidence of diverticulitis. Vascular/Lymphatic: There is abdominal aortic atherosclerosis without aneurysm. IVC filter visualized in situ. Portal vein, superior mesenteric vein, and splenic vein are patent. There is no gastrohepatic or hepatoduodenal ligament lymphadenopathy.  No intraperitoneal or retroperitoneal lymphadenopathy. No pelvic sidewall lymphadenopathy. Reproductive: The uterus is surgically absent. There is no adnexal mass.  Other: No intraperitoneal free fluid. Musculoskeletal: Status post fixation in the femoral necks bilaterally. Old bilateral pubic rami fractures noted. Compression fractures evident at T12, L1, L2, L3, and L4. no ventral hernia. IMPRESSION: 1. No acute findings in the abdomen or pelvis. No findings to explain the patient's history of epigastric pain with nausea. 2.  Aortic Atherosclerois (ICD10-170.0) 3. Left colonic diverticulosis without diverticulitis. Electronically Signed   By: Misty Stanley M.D.   On: 08/17/2018 16:30   Dg Chest Portable 1 View  Result Date: 08/17/2018 CLINICAL DATA:  Weakness, confusion since yesterday EXAM: PORTABLE CHEST 1 VIEW COMPARISON:  07/16/2016 FINDINGS: There is mild bilateral chronic interstitial thickening. There is no focal parenchymal opacity. There is no pleural effusion or pneumothorax. The cardiomediastinal silhouette is stable. There is a 3 lead cardiac pacemaker. The osseous structures are unremarkable. IMPRESSION: No active disease. Electronically Signed   By: Kathreen Devoid   On: 08/17/2018 16:33        Scheduled Meds:  amiodarone  200 mg Oral q morning - 10a   apixaban  2.5 mg Oral BID   atorvastatin  20 mg Oral Daily   calcitonin (salmon)  1 spray Alternating Nares Daily   docusate sodium  100 mg Oral Daily   ferrous sulfate  325 mg Oral BID WC   gabapentin  300 mg Oral q morning - 10a   gabapentin  600 mg Oral QHS   levothyroxine  75 mcg Oral Q0600   mometasone-formoterol  2 puff Inhalation BID   multivitamin with minerals  1 tablet Oral Daily   pantoprazole  40 mg Oral Daily   vitamin B-12  1,000 mcg Oral Daily   Continuous Infusions:  potassium chloride 10 mEq (08/18/18 0806)     LOS: 0 days    Time spent: 35  minutes    Elmarie Shiley, MD [facility] Pager [phone]  If 7PM-7AM, please contact night-coverage [email or web address] Password [facility] 08/18/2018, 9:41 AM

## 2018-08-18 NOTE — Procedures (Signed)
Echo attempted. Patient in chair. Will attempt again. 

## 2018-08-18 NOTE — Evaluation (Signed)
Occupational Therapy Evaluation Patient Details Name: Meredith Gonzalez MRN: [id number] DOB: 1935-11-06 Today's Date: 08/18/2018    History of Present Illness Pt is a 83 y/o female presenting with c/o difficulty walking from B LE weakness and slurred speech. PMH: CAD, a fib, pacemaker 2012, CHF, CVA s/p R CEA (06/03/18), TIA, DM2 with retinopathy, HTN, fibromyalgia.    Clinical Impression   PTA patient reports living alone with aide assist 6 hrs/day for IADLs, independent ADls and mobility using RW, son assists with med mgmt.  Patient admitted for above and limited by problem list below.  She currently requires min assist for basic transfers, min-mod assist for taking a few steps to the sink for grooming seated with min guard to supervision, min guard for UB ADLs and mod assist for LB ADLs. Patient reports she does not have 24/7 support, and at this time is not safe to dc home without 24/7 assistance. Patient will benefit from continued OT services while admitted and after dc at SNF level in order to optimize independence and safety with ADLs/mobility.      Follow Up Recommendations  SNF;Supervision/Assistance - 24 hour(however pt is medicare/observation, so may have to be HHOT)    Equipment Recommendations  None recommended by OT    Recommendations for Other Services       Precautions / Restrictions Precautions Precautions: Fall Restrictions Weight Bearing Restrictions: No  (note in chart: RN reports son bringing back brace for pain control)      Mobility Bed Mobility Overal bed mobility: Needs Assistance Bed Mobility: Supine to Sit;Sit to Sidelying;Rolling Rolling: Supervision   Supine to sit: Min guard;HOB elevated   Sit to sidelying: Min guard General bed mobility comments: min guard for safety and balance, increased time and effort  Transfers Overall transfer level: Needs assistance Equipment used: Rolling walker (2 wheeled) Transfers: Sit to/from Stand Sit to Stand: Min  assist         General transfer comment: min assist to power up and for safety and balance    Balance Overall balance assessment: Needs assistance Sitting-balance support: Single extremity supported;Feet supported Sitting balance-Leahy Scale: Fair Sitting balance - Comments: min guard for safety, preference to UE support   Standing balance support: Bilateral upper extremity supported;During functional activity Standing balance-Leahy Scale: Poor Standing balance comment: relaint on support                           ADL either performed or assessed with clinical judgement   ADL Overall ADL's : Needs assistance/impaired     Grooming: Set up;Sitting   Upper Body Bathing: Sitting;Min guard   Lower Body Bathing: Moderate assistance;Sit to/from stand Lower Body Bathing Details (indicate cue type and reason): min assist for sit<>stand, unable to reach feet (using AE at home)  Upper Body Dressing : Min guard;Sitting   Lower Body Dressing: Moderate assistance;Sit to/from stand Lower Body Dressing Details (indicate cue type and reason): min assist sit<>stand, decreased reach to feet Toilet Transfer: Minimal assistance;Ambulation;RW Toilet Transfer Details (indicate cue type and reason): simulated to chair in room Toileting- Clothing Manipulation and Hygiene: Moderate assistance;Sit to/from stand Toileting - Clothing Manipulation Details (indicate cue type and reason): reliant on BUE support at this time      Functional mobility during ADLs: Minimal assistance;Rolling walker General ADL Comments: pt limited by pain, weakness and impaired balance     Vision   Vision Assessment?: No apparent visual deficits     Perception  Praxis      Pertinent Vitals/Pain Pain Assessment: Faces Faces Pain Scale: Hurts even more Pain Location: head, L hip, lower back Pain Descriptors / Indicators: Aching;Sore Pain Intervention(s): Monitored during session;Premedicated before  session;Repositioned;Limited activity within patient's tolerance     Hand Dominance Right   Extremity/Trunk Assessment Upper Extremity Assessment Upper Extremity Assessment: Generalized weakness   Lower Extremity Assessment Lower Extremity Assessment: Defer to PT evaluation       Communication Communication Communication: No difficulties   Cognition Arousal/Alertness: Awake/alert Behavior During Therapy: WFL for tasks assessed/performed Overall Cognitive Status: Within Functional Limits for tasks assessed                                 General Comments: appears [facility]    General Comments       Exercises     Shoulder Instructions      Home Living Family/patient expects to be discharged to:: Private residence Living Arrangements: Alone Available Help at Discharge: Family;Available PRN/intermittently;Personal care attendant(PCA 6 hrs/day ) Type of Home: House Home Access: Level entry     Home Layout: One level     Bathroom Shower/Tub: Walk-in shower(no threshold)   Bathroom Toilet: Standard Bathroom Accessibility: Yes   Home Equipment: Walker - 4 wheels;Walker - 2 wheels;Bedside commode;Shower seat - built in;Grab bars - tub/shower;Adaptive equipment Adaptive Equipment: Reacher        Prior Functioning/Environment Level of Independence: Independent with assistive device(s)        Comments: reports indepenent ADLs using AE, RW for mobility, aide assist with IADLs and med mgmt assist from son         OT Problem List: Decreased strength;Decreased range of motion;Decreased activity tolerance;Impaired balance (sitting and/or standing);Pain      OT Treatment/Interventions: Self-care/ADL training;Balance training;DME and/or AE instruction;Patient/family education;Therapeutic exercise;Therapeutic activities    OT Goals(Current goals can be found in the care plan section) Acute Rehab OT Goals Patient Stated Goal: to get to rehab OT Goal  Formulation: With patient Time For Goal Achievement: 09/01/18 Potential to Achieve Goals: Good  OT Frequency: Min 3X/week   Barriers to D/C: Decreased caregiver support          Co-evaluation              AM-PAC OT "6 Clicks" Daily Activity     Outcome Measure Help from another person eating meals?: None Help from another person taking care of personal grooming?: A Little Help from another person toileting, which includes using toliet, bedpan, or urinal?: A Lot Help from another person bathing (including washing, rinsing, drying)?: A Lot Help from another person to put on and taking off regular upper body clothing?: A Little Help from another person to put on and taking off regular lower body clothing?: A Lot 6 Click Score: 16   End of Session Equipment Utilized During Treatment: Gait belt;Rolling walker Nurse Communication: Mobility status  Activity Tolerance: Patient tolerated treatment well Patient left: in bed;with call bell/phone within reach;with bed alarm set  OT Visit Diagnosis: Unsteadiness on feet (R26.81);Other abnormalities of gait and mobility (R26.89);Pain;Muscle weakness (generalized) (M62.81) Pain - Right/Left: Left Pain - part of body: Hip(back, head)                Time: 5366-4403 OT Time Calculation (min): 36 min Charges:  OT General Charges $OT Visit: 1 Visit OT Evaluation $OT Eval Moderate Complexity: 1 Mod OT Treatments $Self Care/Home Management : 8-22  mins  Delight Stare, OT Acute Rehabilitation Services Pager [phone] Office [phone]   Delight Stare 08/18/2018, 9:02 AM

## 2018-08-18 NOTE — TOC Progression Note (Signed)
Transition of Care [facility]) - Progression Note    Patient Details  Name: Daylin Eads MRN: [id number] Date of Birth: 1935/09/29  Transition of Care [facility]) CM/SW [place], [place] Phone Number: 08/18/2018, 4:32 PM  Clinical Narrative:   CSW spoke with patient's son to discuss bed offers and discharge plans. Son, Lennette Bihari, is aware that the patient is likely going to need additional assistance moving forward. Lennette Bihari and his wife have been remodeling their downstairs to have the patient move in with them, but that will not be done until sometime in July. Lennette Bihari agreeable to SNF, and hopeful that the patient will be agreeable to [facility]. Lennette Bihari also agreeable to [facility] in [place]. CSW printed out copy of CMS list to review with patient. Patient still adamant that she does not want to go to [facility], is agreeable to looking at [facility] if they could take her. CSW left message with admissions at [facility] to see if they can take her. CSW to follow.    Expected Discharge Plan: [place] Barriers to Discharge: Continued Medical Work up  Expected Discharge Plan and Services Expected Discharge Plan: [place]: [place] arrangements for the past 2 months: Single Family Home, [place]                                       Social Determinants of Health (SDOH) Interventions    Readmission Risk Interventions No flowsheet data found.

## 2018-08-18 NOTE — Progress Notes (Signed)
In and out catheter x 1 done  600 ml urine drained specimen for UA sent.Marland Kitchen        bp improved after the 2nd  500 ml NS bolus started  K run started per order.  Pt mead comfortable.  Care endorsed to  On comining RN to bladder scan pt around 1130 AM if she does not void.  Pt.s son will bring pt/ b back brace from home.

## 2018-08-19 DIAGNOSIS — I4819 Other persistent atrial fibrillation: Secondary | ICD-10-CM

## 2018-08-19 DIAGNOSIS — E860 Dehydration: Secondary | ICD-10-CM

## 2018-08-19 DIAGNOSIS — E1140 Type 2 diabetes mellitus with diabetic neuropathy, unspecified: Secondary | ICD-10-CM

## 2018-08-19 LAB — GLUCOSE, CAPILLARY
Glucose-Capillary: 81 mg/dL (ref 70–99)
Glucose-Capillary: 93 mg/dL (ref 70–99)
Glucose-Capillary: 117 mg/dL — ABNORMAL HIGH (ref 70–99)
Glucose-Capillary: 126 mg/dL — ABNORMAL HIGH (ref 70–99)

## 2018-08-19 LAB — BASIC METABOLIC PANEL
Anion gap: 6 (ref 5–15)
BUN: 19 mg/dL (ref 8–23)
CO2: 22 mmol/L (ref 22–32)
Calcium: 8 mg/dL — ABNORMAL LOW (ref 8.9–10.3)
Chloride: 115 mmol/L — ABNORMAL HIGH (ref 98–111)
Creatinine, Ser: 0.75 mg/dL (ref 0.44–1.00)
GFR calc Af Amer: >60 mL/min (ref >60)
GFR calc non Af Amer: >60 mL/min (ref >60)
Glucose, Bld: 101 mg/dL — ABNORMAL HIGH (ref 70–99)
Potassium: 4.1 mmol/L (ref 3.5–5.1)
Sodium: 143 mmol/L (ref 135–145)

## 2018-08-19 LAB — CBC
HCT: 36.2 % (ref 36.0–46.0)
Hemoglobin: 11.1 g/dL — ABNORMAL LOW (ref 12.0–15.0)
MCH: 32.8 pg (ref 26.0–34.0)
MCHC: 30.7 g/dL (ref 30.0–36.0)
MCV: 107.1 fL — ABNORMAL HIGH (ref 80.0–100.0)
Platelets: 154 10*3/uL (ref 150–400)
RBC: 3.38 MIL/uL — ABNORMAL LOW (ref 3.87–5.11)
RDW: 13.8 % (ref 11.5–15.5)
WBC: 6 10*3/uL (ref 4.0–10.5)
nRBC: 0 % (ref 0.0–0.2)

## 2018-08-19 LAB — TSH: TSH: 8.89 u[IU]/mL — ABNORMAL HIGH (ref 0.350–4.500)

## 2018-08-19 LAB — T4, FREE: Free T4: 1.2 ng/dL (ref 0.82–1.77)

## 2018-08-19 MED ORDER — DICLOFENAC SODIUM 1 % TD GEL
2.0000 g | Freq: Four times a day (QID) | TRANSDERMAL | Status: DC
  Administered 2018-08-19 – 2018-08-22 (×10): 2 g via TOPICAL
  Filled 2018-08-19: qty 100

## 2018-08-19 MED ORDER — APIXABAN 2.5 MG PO TABS
2.5000 mg | ORAL_TABLET | Freq: Two times a day (BID) | ORAL | Status: DC
  Administered 2018-08-19 – 2018-08-22 (×6): 2.5 mg via ORAL
  Filled 2018-08-19 (×6): qty 1

## 2018-08-19 NOTE — Evaluation (Signed)
Speech Language Pathology Evaluation Patient Details Name: Meredith Gonzalez MRN: [id number] DOB: 11-30-1935 Today's Date: 08/19/2018 Time: 1020-1040 SLP Time Calculation (min) (ACUTE ONLY): 20 min  Problem List:  Patient Active Problem List   Diagnosis Date Noted  . Dehydration 08/18/2018  . Dizziness 07/09/2018  . Stroke due to embolism of carotid artery ([place]) 06/05/2018  . Stroke-like symptom   . Dyspnea   . Acute blood loss anemia   . Acute ischemic stroke ([place]) 05/31/2018  . Stroke ([place]) 05/30/2018  . Chronic diastolic congestive heart failure ([place])   . Anemia of chronic disease   . Hypoalbuminemia due to protein-calorie malnutrition ([place])   . Labile blood pressure   . Atrial fibrillation ([place])   . Chronic neck pain   . Subcortical infarction ([place]) 04/27/2018  . CVA (cerebral vascular accident) ([place]) 04/26/2018  . TIA (transient ischemic attack) 04/24/2018  . CAD (coronary artery disease) 08/07/2016  . (HFpEF) heart failure with preserved ejection fraction ([place]) 08/07/2016  . Ischemic cardiomyopathy 08/07/2016  . Chest pain   . NSTEMI (non-ST elevated myocardial infarction) ([place])   . Status post coronary artery stent placement   . Abnormal stress test   . SOB (shortness of breath)   . Multifocal pneumonia 07/13/2016  . Elevated troponin 07/13/2016  . Leukocytosis   . Pacemaker 05/17/2014  . Complications, pacemaker cardiac, mechanical 12/31/2012  . Edema extremities 10/08/2012  . Persistent atrial fibrillation 10/08/2012  . Hypertension 10/08/2012  . Hypothyroidism 10/08/2012  . Neuropathy 10/08/2012  . Diabetes ([place]) 10/08/2012  . Depression 09/08/2012  . Hypokalemia 08/08/2012  . Anemia 08/08/2012  . Knee fracture, left 08/07/2012  . Recurrent falls 08/07/2012  . Syncope 08/07/2012  . SDH (subdural hematoma) ([place]) 08/07/2012   Past Medical History:  Past Medical History:  Diagnosis Date  . Anemia   . Anxiety   . Arthritis    "all over" (02/11/2018)  .  Basal cell carcinoma    "left leg" (02/11/2018)  . Cervical spine fracture ([place]) 12/2017   "C1-2"  . CHF (congestive heart failure) ([place])   . Chronic bronchitis ([place])   . Chronic neck pain    "since I broke my neck 6-8 wk ago" (02/11/2018)  . Diabetic peripheral neuropathy ([place])   . Fibromyalgia   . Fracture of right humerus   . Generalized weakness   . Headache    "weekly" (02/11/2018)  . History of blood transfusion    "related to one of my femur surgeries" (02/11/2018)  . History of echocardiogram    Echo 8/18: EF 50-55, no RWMA, Gr 1 DD, calcified AV leaflets, MAC, trivial MR, mod LAE, PASP 37  . Hypertension   . Hypothyroidism   . Incontinence of urine   . Ischemic stroke ([place]) 2000   "lost part of the vision in my right eye" (02/11/2018)  . Major depression, chronic   . Myocardial infarction ([place]) 06/2016  . Pacemaker   . Recurrent falls   . Syncope and collapse   . Type 2 diabetes, diet controlled ([place])    Past Surgical History:  Past Surgical History:  Procedure Laterality Date  . ABDOMINAL HYSTERECTOMY    . ANKLE FRACTURE SURGERY Right   . BASAL CELL CARCINOMA EXCISION Left    "leg"  . CARDIAC CATHETERIZATION  ~ 2014  . CARDIOVERSION N/A 02/13/2018   Procedure: CARDIOVERSION;  Surgeon: Sueanne Margarita, MD;  Location: [facility] ENDOSCOPY;  Service: Cardiovascular;  Laterality: N/A;  . CARDIOVERSION N/A 03/27/2018   Procedure: CARDIOVERSION;  Surgeon: Lelon Perla, MD;  Location: [facility] ENDOSCOPY;  Service: Cardiovascular;  Laterality: N/A;  . CARPAL TUNNEL RELEASE     bilateral  . CATARACT EXTRACTION W/ INTRAOCULAR LENS  IMPLANT, BILATERAL Bilateral   . CHOLECYSTECTOMY OPEN    . COLONOSCOPY    . CORONARY ANGIOPLASTY WITH STENT PLACEMENT  06/2016  . CORONARY STENT INTERVENTION N/A 07/19/2016   Procedure: Coronary Stent Intervention;  Surgeon: Peter M Martinique, MD;  Location: [place] CV LAB;  Service: Cardiovascular;  Laterality: N/A;  . ENDARTERECTOMY Right 06/03/2018    Procedure: ENDARTERECTOMY CAROTID;  Surgeon: Rosetta Posner, MD;  Location: [place];  Service: Vascular;  Laterality: Right;  . EYE SURGERY     right eye catarace/lens implant  . FEMUR FRACTURE SURGERY Bilateral   . FRACTURE SURGERY    . GASTRIC BYPASS    . INSERT / REPLACE / REMOVE PACEMAKER  ~ 2012  . LEAD REVISION N/A 01/01/2013   Procedure: LEAD REVISION;  Surgeon: Evans Lance, MD;  [facility] C[facility] LAB;  Service: Cardiovascular;  Laterality: N/A;  . LEFT HEART CATH AND CORONARY ANGIOGRAPHY N/A 07/19/2016   Procedure: Left Heart Cath and Coronary Angiography;  Surgeon: Peter M Martinique, MD;  Location: [place] CV LAB;  Service: Cardiovascular;  Laterality: N/A;  . OVARIAN CYST SURGERY     "one side only"  . PATCH ANGIOPLASTY Right 06/03/2018   Procedure: PATCH ANGIOPLASTY USING HEMASHIELD PLATINUM FINESSE;  Surgeon: Rosetta Posner, MD;  Location: [place];  Service: Vascular;  Laterality: Right;  . TEE WITHOUT CARDIOVERSION N/A 02/13/2018   Procedure: TRANSESOPHAGEAL ECHOCARDIOGRAM (TEE);  Surgeon: Sueanne Margarita, MD;  Location[facility] ENDOSCOPY;  Service: Cardiovascular;  Laterality: N/A;  . TONSILLECTOMY     HPI:  Pt is an 83 y.o. female w CAD PCI/ stent, Pafib on apixaban, h/o pacer 7341, CHF (diastolic),   h/o CVA s/p R CEA (06/03/18), posterior circulation TIA 07/09/18,  DM2 with retinopathy, and Hypothyroidism who presented to ED with c/o difficulty walking secondary to bilateral leg weakness, slurred speech, and difficulty understanding and expressing herself. CT of the head was negative for acute changes. She was most recently seen by speech pathology on 07/10/18 and her speech, language and cognition were within normal limits at that time.    Assessment / Plan / Recommendation Clinical Impression  Pt was seen by speech pathology on 07/10/18 and demonstrated speech, language, and cognitive skills which were within normal limits. She reported that she has still been living independently,  and that her slurred speech has now resolved. Her speech and language skills are currently within normal limits and no overt cognitive deficits were noted. Further skilled SLP services are not clinically indicated at this time. Pt, and nursing were educated regarding results and recommendations; both parties verbalized understanding as well as agreement with plan of care.    SLP Assessment  SLP Recommendation/Assessment: Patient does not need any further Speech Lanaguage Pathology Services SLP Visit Diagnosis: Aphasia (R47.01)    Follow Up Recommendations  None    Frequency and Duration           SLP Evaluation Cognition  Overall Cognitive Status: Within Functional Limits for tasks assessed Arousal/Alertness: Awake/alert Orientation Level: Oriented X4 Attention: Focused;Sustained Focused Attention: Appears intact Sustained Attention: Appears intact Memory: Appears intact(Immediate: 3/3; Delayed: 3/3) Awareness: Appears intact Problem Solving: Appears intact(4/4) Executive Function: Reasoning Reasoning: Appears intact(2/2)       Comprehension  Auditory Comprehension Overall Auditory Comprehension: Appears within  functional limits for tasks assessed Yes/No Questions: Within Functional Limits Basic Biographical Questions: (5/5) Complex Questions: (5/5) Paragraph Comprehension (via yes/no questions): (4/4) Commands: Within Functional Limits Two Step Basic Commands: (4/4) Multistep Basic Commands: (4/4) Conversation: Complex Visual Recognition/Discrimination Discrimination: Within Function Limits Reading Comprehension Reading Status: Within funtional limits    Expression Expression Primary Mode of Expression: Verbal Verbal Expression Overall Verbal Expression: Appears within functional limits for tasks assessed Initiation: No impairment Automatic Speech: Counting;Day of week;Month of year(WNL) Level of Generative/Spontaneous Verbalization:  Sentence;Conversation Repetition: No impairment Naming: Not tested Responsive: (5/5) Confrontation: Within functional limits(10/10) Convergent: (Sentence completion: 5/5) Pragmatics: No impairment   Oral / Motor  Oral Motor/Sensory Function Overall Oral Motor/Sensory Function: Within functional limits Motor Speech Overall Motor Speech: Appears within functional limits for tasks assessed Respiration: Within functional limits Phonation: Normal Resonance: Within functional limits Articulation: Within functional limitis Intelligibility: Intelligible Motor Planning: Witnin functional limits Motor Speech Errors: Not applicable   Chanika Byland I. Hardin Negus, [place], [place] Office number [phone] Pager [phone]                     Horton Marshall 08/19/2018, 10:43 AM

## 2018-08-19 NOTE — Discharge Summary (Addendum)
Physician Discharge Summary  Meredith Gonzalez AYT:[id number] DOB: [birth year] DOA: 08/17/2018  PCP: Sinclair Ship, MD  Admit date: 08/17/2018 Discharge date: 08/22/2018  Time spent: 45 minutes  Recommendations for Outpatient Follow-up:  Patient will be discharged to skilled nursing facility, continue physical and occupational therapy.  Patient will need to follow up with primary care provider within one week of discharge. Follow up with neurology. Repeat thyroid function studies in 4-6 weeks.  Patient should continue medications as prescribed.  Patient should follow a heart healthy/carb modified diet.    Discharge Diagnoses:  Neurological deficits including slurred speech and lower extremity weakness Hypotension, dehydration Paroxysmal atrial fibrillation History of CVA, posterior circulation Diabetes mellitus, type II Hypothyroidism Asthma Urinary retention Hypokalemia Headaches, neck pain  Discharge Condition: Stable  Diet recommendation: heart healthy/carb modified  Filed Weights   08/17/18 1418 08/17/18 2000  Weight: 51.3 kg 57.6 kg    History of present illness:  On 08/17/2018 by Dr. Jani Gravel Meredith Gonzalez  is a 83 y.o. female,w CAD PCI/ stent, Pafib on apixaban, h/o pacer 3557, CHF (diastolic),   h/o CVA s/p R CEA (06/03/18), posterior circulation TIA 07/09/18,  Dm2 with retinopathy, Hypothyroidism apparently c/o difficulty walking secondary to bilateral leg weakness starting last nite along with slurred speech.  Pt states that her difficulty walking was worse this morning and therefore presented [facility] ED.  Pt denies headache, seizure, vision change, cp, palp, sob, n/v, abd pain, diarrhea, brbpr, dysuria, hematuria.    Hospital Course:  Neurological deficits including slurred speech and lower extremity weakness -TIA versus stroke rule out -CT head stable, without acute intracranial findings. -CTA head and neck: Stable CTA head and neck.  No large vessel occlusion.  Approximate  50% atheromatous stenosis at the origin of the left ICA.  Patent right carotid endarterectomy. -No MRI due to pacemaker. -hemoglobin A1c 5.5, LDL 44 -Echocardiogram: EF 60 to 65%.  Mild concentric left ventricular hypertrophy.  LV diastolic Doppler parameters consistent with pseudonormalization. -Neurology consulted and appreciated-recommended repeat CT scan in 48 to 72 hours -Repeat CT head today: stable . No detectable acute infarct -PT, OT recommending SNF  Hypotension, dehydration -Patient required IV fluids -TSH 8.890 -AM Cortisol level 14.1  Paroxysmal atrial fibrillation -Continue amiodarone, Eliquis  History of CVA, posterior circulation -Continue Eliquis  Diabetes mellitus, type II -hemoglobin A1c 5.5 -appears to be diet controlled  Hypothyroidism -Continue Synthroid -TSH 8.890 -FT4 1.2 -patient will need to have repeat thyroid tests in 4-6 weeks  Asthma -Stable, continue albuterol as needed  Urinary retention -Placed on Flomax -UA unremarkable for infection  Hypokalemia -resolved with replacement  Headaches, neck pain -continue Tylenol and Voltaren gel as needed -Requested cervical soft collar- counseled patient on the use of this- may decrease ROM   Code status: DNR  Procedures: Echocardiogram  Consultations: Neurology  Discharge Exam: Vitals:   08/22/18 0407 08/22/18 0812  BP: 138/63   Pulse: 64   Resp: 17   Temp: 97.8 F (36.6 C)   SpO2: 96% 96%     General: Well developed, well nourished, NAD, appears stated age  HEENT: NCAT, mucous membranes moist.  Neck: Supple, limited ROM due to pain and soreness  Cardiovascular: S1 S2 auscultated, RRR  Respiratory: Clear to auscultation bilaterally with equal chest rise  Abdomen: Soft, nontender, nondistended, + bowel sounds  Extremities: warm dry without cyanosis clubbing or edema  Neuro: AAOx3, nonfocal  Psych: Appropriate mood and affect, pleasant   Discharge  Instructions Discharge Instructions    Ambulatory  referral to Neurology   Complete by:  As directed    An appointment is requested in approximately: 4-6 weeks   Discharge instructions   Complete by:  As directed    Patient will be discharged to skilled nursing facility, continue physical and occupational therapy.  Patient will need to follow up with primary care provider within one week of discharge. Follow up with neurology. Repeat thyroid function studies in 4-6 weeks.  Patient should continue medications as prescribed.  Patient should follow a heart healthy/carb modified diet.     Allergies as of 08/22/2018      Reactions   Valium [diazepam] Anxiety   Makes patient hyper   Robaxin [methocarbamol] Other (See Comments)   Makes pt loopy/acts drunk   Tramadol Other (See Comments)   sedation   Codeine Hives, Itching   Can take with Benadryl   Darifenacin Itching   Can take with Benadryl   Darvon [propoxyphene Hcl] Itching   Can take with Benadryl   Daypro [oxaprozin] Itching   Can take with Benadryl   Enablex [darifenacin Hydrobromide Er] Itching   Can take with Benadryl   Oxycodone Itching   Can take with Benadryl   Propoxyphene Itching   Can take with Benadryl   Risperdal [risperidone] Itching   Can take with Benadryl   Talwin [pentazocine] Itching   Can take with Benadryl   Vicodin [hydrocodone-acetaminophen] Itching   Can take with Benadryl      Medication List    STOP taking these medications   vitamin B-12 1000 MCG tablet Commonly known as:  CYANOCOBALAMIN     TAKE these medications   acetaminophen 500 MG tablet Commonly known as:  TYLENOL Take 500 mg by mouth every 6 (six) hours as needed for moderate pain.   albuterol 108 (90 Base) MCG/ACT inhaler Commonly known as:  VENTOLIN HFA Inhale 2 puffs into the lungs every 6 (six) hours as needed for wheezing or shortness of breath.   amiodarone 200 MG tablet Commonly known as:  PACERONE Take 1 tablet (200 mg  total) by mouth every morning.   apixaban 2.5 MG Tabs tablet Commonly known as:  ELIQUIS Take 1 tablet (2.5 mg total) by mouth 2 (two) times daily.   atorvastatin 20 MG tablet Commonly known as:  LIPITOR Take 1 tablet (20 mg total) by mouth daily for 30 days.   budesonide-formoterol 160-4.5 MCG/ACT inhaler Commonly known as:  Symbicort Inhale 2 puffs into the lungs 2 (two) times daily.   calcitonin (salmon) 200 UNIT/ACT nasal spray Commonly known as:  MIACALCIN/FORTICAL Place 1 spray into the nose daily.   diclofenac sodium 1 % Gel Commonly known as:  VOLTAREN Apply 2 g topically 4 (four) times daily.   docusate sodium 100 MG capsule Commonly known as:  COLACE Take 1 capsule (100 mg total) by mouth daily.   feeding supplement (ENSURE ENLIVE) Liqd Take 237 mLs by mouth 2 (two) times daily between meals.   ferrous sulfate 325 (65 FE) MG tablet Take 1 tablet (325 mg total) by mouth 2 (two) times daily with a meal.   flintstones complete 60 MG chewable tablet Chew 1 tablet by mouth daily.   gabapentin 300 MG capsule Commonly known as:  NEURONTIN Take 1-2 capsules (300-600 mg total) by mouth See admin instructions. Take 1 capsule (300 mg) by mouth in the mornning & take 2 capsules (600 mg) by mouth at night   levothyroxine 75 MCG tablet Commonly known as:  SYNTHROID Take 1 tablet (  75 mcg total) by mouth daily at 6 (six) AM.   LORazepam 0.5 MG tablet Commonly known as:  Ativan Take 0.5 tablets (0.25 mg total) by mouth every 6 (six) hours as needed for anxiety or sleep.   nitroGLYCERIN 0.4 MG SL tablet Commonly known as:  NITROSTAT Place 1 tablet (0.4 mg total) under the tongue every 5 (five) minutes as needed for chest pain.   pantoprazole 40 MG tablet Commonly known as:  PROTONIX Take 1 tablet (40 mg total) by mouth daily.   tamsulosin 0.4 MG Caps capsule Commonly known as:  FLOMAX Take 1 capsule (0.4 mg total) by mouth daily.   VITAMIN B-3 PO Take 1 tablet by  mouth 2 (two) times a day.      Allergies  Allergen Reactions   Valium [Diazepam] Anxiety    Makes patient hyper   Robaxin [Methocarbamol] Other (See Comments)    Makes pt loopy/acts drunk   Tramadol Other (See Comments)    sedation   Codeine Hives and Itching    Can take with Benadryl    Darifenacin Itching    Can take with Benadryl   Darvon [Propoxyphene Hcl] Itching    Can take with Benadryl   Daypro [Oxaprozin] Itching    Can take with Benadryl   Enablex [Darifenacin Hydrobromide Er] Itching    Can take with Benadryl   Oxycodone Itching    Can take with Benadryl    Propoxyphene Itching    Can take with Benadryl   Risperdal [Risperidone] Itching    Can take with Benadryl   Talwin [Pentazocine] Itching    Can take with Benadryl   Vicodin [Hydrocodone-Acetaminophen] Itching    Can take with Benadryl     Contact information for follow-up providers    Sinclair Ship, MD. Schedule an appointment as soon as possible for a visit in 1 week(s).   Specialty:  Internal Medicine Why:  Hospital follow up Contact information: [address] [place] [address] [phone]        Fay Records, MD .   Specialty:  Cardiology Contact information: [address] [place] [address] [phone]        Evans Lance, MD .   Specialty:  Cardiology Contact information: [address] [address] [phone]            Contact information for after-discharge care    Destination    [facility] .   Service:  Skilled Nursing Contact information: [address] [place] [phone]                   The results of significant diagnostics from this hospitalization (including imaging, microbiology, ancillary and laboratory) are listed below for reference.    Significant Diagnostic Studies: Ct Angio Head W Or Wo Contrast  Result  Date: 08/18/2018 CLINICAL DATA:  Initial evaluation for acute slurred speech, difficulty walking. EXAM: CT ANGIOGRAPHY HEAD AND NECK TECHNIQUE: Multidetector CT imaging of the head and neck was performed using the standard protocol during bolus administration of intravenous contrast. Multiplanar CT image reconstructions and MIPs were obtained to evaluate the vascular anatomy. Carotid stenosis measurements (when applicable) are obtained utilizing NASCET criteria, using the distal internal carotid diameter as the denominator. CONTRAST:  77mL OMNIPAQUE IOHEXOL 350 MG/ML SOLN COMPARISON:  Prior head CT from earlier same day. FINDINGS: CTA NECK FINDINGS Aortic arch: Visualized aortic arch of normal caliber  with normal 3 vessel morphology. Mild atheromatous change about the arch and origin of the great vessels without flow-limiting stenosis. Visualized subclavian arteries widely patent. Right carotid system: Right common and internal carotid arteries widely patent without stenosis, dissection, or occlusion. Widely patent right endarterectomy. Left carotid system: Left common carotid artery tortuous but widely patent to the bifurcation. Bulky calcified plaque about the left bifurcation with associated stenosis of up to 50% by NASCET criteria, stable. No made of approximate 50% stenosis at the origin of the left external carotid artery as well. Left ICA widely patent distally to the skull base without stenosis, dissection or occlusion. Vertebral arteries: Both vertebral arteries arise from the subclavian arteries. Left vertebral artery dominant. Vertebral arteries widely patent within the neck without stenosis, dissection or occlusion. 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 Associated chronic fractures through the anterior and posterior arches of C1 noted as well. Prior fusion at C5-C7. No acute osseous abnormality. No worrisome osseous lesions. Patient is edentulous. Other neck: No other acute  soft tissue abnormality within the neck. Upper chest: Chronic irregular pleuroparenchymal thickening with architectural distortion involving the right greater than left upper lobes noted, stable from previous. Partially visualized upper chest demonstrates no acute finding. Review of the MIP images confirms the above findings CTA HEAD FINDINGS Anterior circulation: Petrous segments widely patent. Mild atheromatous plaque within the cavernous/supraclinoid ICAs without hemodynamically significant stenosis. A1 segments widely patent. Normal anterior communicating artery. Anterior cerebral arteries widely patent to their distal aspects. No M1 stenosis or occlusion. Normal MCA bifurcations. Distal MCA branches well perfused and symmetric. Posterior circulation: Vertebral arteries widely patent to the vertebrobasilar junction without stenosis. Posterior inferior cerebral arteries patent bilaterally. Basilar widely patent to its distal aspect. Superior cerebellar and posterior cerebral arteries widely patent bilaterally. Venous sinuses: Patent. Anatomic variants: None significant. Delayed phase: No abnormal enhancement. Review of the MIP images confirms the above findings IMPRESSION: 1. Stable CTA of the head and neck. No large vessel occlusion or other acute vascular abnormality. 2. Approximate 50% atheromatous stenosis at the origin of the left ICA, stable. 3. Widely patent right carotid endarterectomy. 4. 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 Electronically Signed   By: Jeannine Boga M.D.   On: 08/18/2018 01:59   Ct Head Wo Contrast  Result Date: 08/20/2018 CLINICAL DATA:  Stroke follow-up EXAM: CT HEAD WITHOUT CONTRAST TECHNIQUE: Contiguous axial images were obtained from the base of the skull through the vertex without intravenous contrast. COMPARISON:  Three days ago FINDINGS: Brain: No evidence of acute  infarction, hemorrhage, hydrocephalus, extra-axial collection or mass lesion/mass effect. Chronic small vessel ischemia with confluent low-density in the cerebral white matter. Moderate generalized cerebral atrophy. Vascular: Atherosclerotic calcification. Skull: No acute or aggressive finding. Known C1 ring and base of dens fractures. Sinuses/Orbits: Bilateral cataract resection. IMPRESSION: 1. Stable head CT.  No detectable acute infarct. 2. Moderate senescent changes. Electronically Signed   By: Monte Fantasia M.D.   On: 08/20/2018 08:32   Ct Head Wo Contrast  Result Date: 08/17/2018 CLINICAL DATA:  Bilateral upper extremity weakness and slurred speech since last night. History of diabetes, syncope and myocardial infarction. EXAM: CT HEAD WITHOUT CONTRAST TECHNIQUE: Contiguous axial images were obtained from the base of the skull through the vertex without intravenous contrast. COMPARISON:  CT head 07/09/2018. FINDINGS: Brain: There is no evidence of acute intracranial hemorrhage, mass lesion, brain edema or extra-axial  fluid collection. There is stable mild atrophy with prominence of the ventricles and subarachnoid spaces. Chronic small vessel ischemic changes in the periventricular white matter appear unchanged. There is no CT evidence of acute cortical infarction. Vascular: Intracranial vascular calcifications. No hyperdense vessel identified. Skull: Negative for fracture or focal lesion. Sinuses/Orbits: The visualized paranasal sinuses and mastoid air cells are clear. Previous bilateral lens surgery. No acute orbital abnormalities are seen. Other: None. IMPRESSION: Stable head CT without acute intracranial findings. Stable mild atrophy and chronic small vessel ischemic changes. Electronically Signed   By: Richardean Sale M.D.   On: 08/17/2018 16:26   Ct Angio Neck W Or Wo Contrast  Result Date: 08/18/2018 CLINICAL DATA:  Initial evaluation for acute slurred speech, difficulty walking. EXAM: CT  ANGIOGRAPHY HEAD AND NECK TECHNIQUE: Multidetector CT imaging of the head and neck was performed using the standard protocol during bolus administration of intravenous contrast. Multiplanar CT image reconstructions and MIPs were obtained to evaluate the vascular anatomy. Carotid stenosis measurements (when applicable) are obtained utilizing NASCET criteria, using the distal internal carotid diameter as the denominator. CONTRAST:  38mL OMNIPAQUE IOHEXOL 350 MG/ML SOLN COMPARISON:  Prior head CT from earlier same day. FINDINGS: CTA NECK FINDINGS Aortic arch: Visualized aortic arch of normal caliber with normal 3 vessel morphology. Mild atheromatous change about the arch and origin of the great vessels without flow-limiting stenosis. Visualized subclavian arteries widely patent. Right carotid system: Right common and internal carotid arteries widely patent without stenosis, dissection, or occlusion. Widely patent right endarterectomy. Left carotid system: Left common carotid artery tortuous but widely patent to the bifurcation. Bulky calcified plaque about the left bifurcation with associated stenosis of up to 50% by NASCET criteria, stable. No made of approximate 50% stenosis at the origin of the left external carotid artery as well. Left ICA widely patent distally to the skull base without stenosis, dissection or occlusion. Vertebral arteries: Both vertebral arteries arise from the subclavian arteries. Left vertebral artery dominant. Vertebral arteries widely patent within the neck without stenosis, dissection or occlusion. 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 Associated chronic fractures through the anterior and posterior arches of C1 noted as well. Prior fusion at C5-C7. No acute osseous abnormality. No worrisome osseous lesions. Patient is edentulous. Other neck: No other acute soft tissue abnormality within the neck. Upper chest: Chronic irregular pleuroparenchymal thickening with  architectural distortion involving the right greater than left upper lobes noted, stable from previous. Partially visualized upper chest demonstrates no acute finding. Review of the MIP images confirms the above findings CTA HEAD FINDINGS Anterior circulation: Petrous segments widely patent. Mild atheromatous plaque within the cavernous/supraclinoid ICAs without hemodynamically significant stenosis. A1 segments widely patent. Normal anterior communicating artery. Anterior cerebral arteries widely patent to their distal aspects. No M1 stenosis or occlusion. Normal MCA bifurcations. Distal MCA branches well perfused and symmetric. Posterior circulation: Vertebral arteries widely patent to the vertebrobasilar junction without stenosis. Posterior inferior cerebral arteries patent bilaterally. Basilar widely patent to its distal aspect. Superior cerebellar and posterior cerebral arteries widely patent bilaterally. Venous sinuses: Patent. Anatomic variants: None significant. Delayed phase: No abnormal enhancement. Review of the MIP images confirms the above findings IMPRESSION: 1. Stable CTA of the head and neck. No large vessel occlusion or other acute vascular abnormality. 2. Approximate 50% atheromatous stenosis at the origin of the left ICA, stable. 3. Widely patent right carotid endarterectomy. 4. 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 Electronically Signed   By: Jeannine Boga M.D.   On: 08/18/2018 01:59   Ct Abdomen Pelvis W Contrast  Result Date: 08/17/2018 CLINICAL DATA:  Epigastric pain with nausea. EXAM: CT ABDOMEN AND PELVIS WITH CONTRAST TECHNIQUE: Multidetector CT imaging of the abdomen and pelvis was performed using the standard protocol following bolus administration of intravenous contrast. CONTRAST:  184mL OMNIPAQUE IOHEXOL 300 MG/ML  SOLN COMPARISON:  No comparison studies available. FINDINGS: Lower  chest: Chronic interstitial changes noted in the lower lungs bilaterally. No pleural effusion. Hepatobiliary: No suspicious focal abnormality within the liver parenchyma. Gallbladder is surgically absent. No intrahepatic or extrahepatic biliary dilation. Pancreas: No focal mass lesion. No dilatation of the main duct. No intraparenchymal cyst. No peripancreatic edema. Spleen: No splenomegaly. No focal mass lesion. Adrenals/Urinary Tract: No adrenal nodule or mass. Right kidney unremarkable. 8 mm well-defined low-density lesion in the interpolar left kidney is likely a cyst. No evidence for hydroureter. The urinary bladder appears normal for the degree of distention. Stomach/Bowel: Status post gastric bypass. Duodenum is normally positioned as is the ligament of Treitz. No small bowel wall thickening. No small bowel dilatation. The terminal ileum is normal. The appendix is 5 mm diameter without evidence for periappendiceal edema or inflammation. No gross colonic mass. No colonic wall thickening. Diverticular changes are noted in the left colon without evidence of diverticulitis. Vascular/Lymphatic: There is abdominal aortic atherosclerosis without aneurysm. IVC filter visualized in situ. Portal vein, superior mesenteric vein, and splenic vein are patent. There is no gastrohepatic or hepatoduodenal ligament lymphadenopathy. No intraperitoneal or retroperitoneal lymphadenopathy. No pelvic sidewall lymphadenopathy. Reproductive: The uterus is surgically absent. There is no adnexal mass. Other: No intraperitoneal free fluid. Musculoskeletal: Status post fixation in the femoral necks bilaterally. Old bilateral pubic rami fractures noted. Compression fractures evident at T12, L1, L2, L3, and L4. no ventral hernia. IMPRESSION: 1. No acute findings in the abdomen or pelvis. No findings to explain the patient's history of epigastric pain with nausea. 2.  Aortic Atherosclerois (ICD10-170.0) 3. Left colonic diverticulosis  without diverticulitis. Electronically Signed   By: Misty Stanley M.D.   On: 08/17/2018 16:30   Dg Chest Portable 1 View  Result Date: 08/17/2018 CLINICAL DATA:  Weakness, confusion since yesterday EXAM: PORTABLE CHEST 1 VIEW COMPARISON:  07/16/2016 FINDINGS: There is mild bilateral chronic interstitial thickening. There is no focal parenchymal opacity. There is no pleural effusion or pneumothorax. The cardiomediastinal silhouette is stable. There is a 3 lead cardiac pacemaker. The osseous structures are unremarkable. IMPRESSION: No active disease. Electronically Signed   By: Kathreen Devoid   On: 08/17/2018 16:33    Microbiology: Recent Results (from the past 240 hour(s))  SARS Coronavirus 2 (Hosp order,Performed in [facility] lab via [employer] [facility])     Status: None   Collection Time: 08/17/18  3:07 PM  Result Value Ref Range Status   SARS Coronavirus 2 ([employer] Now) NEGATIVE NEGATIVE Final    Comment: (NOTE) Interpretive Result Comment(s): COVID 19 Positive SARS CoV 2 target nucleic acids are DETECTED. The SARS CoV 2 RNA is generally detectable in upper and lower respiratory specimens during the acute phase of infection.  Positive results are indicative of active infection with SARS CoV 2.  Clinical correlation with patient history and other diagnostic information is necessary to determine patient infection status.  Positive results do not rule out bacterial infection or coinfection with other viruses. The expected result is Negative. COVID 19 Negative SARS CoV  2 target nucleic acids are NOT DETECTED. The SARS CoV 2 RNA is generally detectable in upper and lower respiratory specimens during the acute phase of infection.  Negative results do not preclude SARS CoV 2 infection, do not rule out coinfections with other pathogens, and should not be used as the sole basis for treatment or other patient management decisions.  Negative results must be combined with clinical  observations,  patient history, and epidemiological information. The expected result is Negative. Invalid Presence or absence of SARS CoV 2 nucleic acids cannot be determined. Repeat testing was performed on the submitted specimen and repeated Invalid results were obtained.  If clinically indicated, additional testing on a new specimen with an alternate test methodology [phone]) is advised.  The SARS CoV 2 RNA is generally detectable in upper and lower respiratory specimens during the acute phase of infection. The expected result is Negative. Fact Sheet for Patients:  G[employer]ingFamily.no Fact Sheet for Healthcare Providers: https://www.hernandez-brewer.com/ This test is not yet approved or cleared by the [place] FDA and has been authorized for detection and/or diagnosis of SARS CoV 2 by FDA under an Emergency Use Authorization (EUA).  This EUA will remain in effect (meaning this test can be used) for the duration of the COVID19 d eclaration under Section 564(b)(1) of the Act, 21 U.S.C. section [phone] 3(b)(1), unless the authorization is terminated or revoked sooner. Performed at [facility], [address] [place], [place] [address]      Labs: Basic Metabolic Panel: Recent Labs  Lab 08/17/18 1507 08/18/18 0334 08/19/18 0359  NA 142 141 143  K 3.0* 3.3* 4.1  CL 104 110 115*  CO2 26 24 22   GLUCOSE 106* 152* 101*  BUN 31* 24* 19  CREATININE 1.00 1.00 0.75  CALCIUM 8.4* 8.0* 8.0*   Liver Function Tests: Recent Labs  Lab 08/17/18 1507 08/18/18 0334  AST 20 19  ALT 17 14  ALKPHOS 98 83  BILITOT 0.6 0.5  PROT 7.1 5.6*  ALBUMIN 3.6 2.7*   Recent Labs  Lab 08/17/18 1507  LIPASE 31   No results for input(s): AMMONIA in the last 168 hours. CBC: Recent Labs  Lab 08/17/18 1507 08/18/18 0334 08/19/18 0359  WBC 7.6 6.3 6.0  NEUTROABS 5.7  --   --   HGB 13.5 11.7* 11.1*  HCT 43.5 37.5 36.2  MCV 106.1* 106.8* 107.1*  PLT 194  172 154   Cardiac Enzymes: Recent Labs  Lab 08/17/18 1507  TROPONINI <0.03   BNP: BNP (last 3 results) Recent Labs    02/10/18 1519 07/09/18 2129 08/17/18 1507  BNP 550.4* 135.1* 54.3    ProBNP (last 3 results) No results for input(s): PROBNP in the last 8760 hours.  CBG: Recent Labs  Lab 08/21/18 0620 08/21/18 1106 08/21/18 1630 08/21/18 2201 08/22/18 0621  GLUCAP 79 83 86 181* 80       Signed:  Sharron Petruska  [facility] 08/22/2018, 8:25 AM

## 2018-08-19 NOTE — Progress Notes (Signed)
Orthopedic Tech Progress Note Patient Details:  Meredith Gonzalez January 03, 1936 [id number]  Ortho Devices Type of Ortho Device: Soft collar Ortho Device/Splint Interventions: Application   Post Interventions Patient Tolerated: Well Instructions Provided: Care of device   [name] 08/19/2018, 2:00 PM

## 2018-08-19 NOTE — Progress Notes (Signed)
Physical Therapy Treatment Patient Details Name: Meredith Gonzalez MRN: [id number] DOB: 02-Dec-1935 Today's Date: 08/19/2018    History of Present Illness Pt is a 83 y/o female presenting with c/o difficulty walking from B LE weakness and slurred speech. PMH: CAD, a fib, pacemaker 2012, CHF, CVA s/p R CEA (06/03/18), TIA, DM2 with retinopathy, HTN, fibromyalgia.     PT Comments    Pt progressing well towards physical therapy goals. Pt with increased pain 2 cervical fractures, and reports significant improvement with travel pillow behind neck. Discussed ordering a soft collar with RN, so pt can utilize for comfort during PT sessions. Will continue to follow and progress as able per POC.    Follow Up Recommendations  SNF;Supervision/Assistance - 24 hour     Equipment Recommendations  None recommended by PT    Recommendations for Other Services       Precautions / Restrictions Precautions Precautions: Fall;Back Precaution Booklet Issued: No(For comfort) Precaution Comments: many falls with Sept neck fracture, April 2020 hurt left hip with indeterminate age of L-spine fractures Required Braces or Orthoses: Spinal Brace;Cervical Brace Cervical Brace: Soft collar(PT ordered one for pt to wear during sessions for comfort) Spinal Brace: Other (comment)(Appears for comfort only?? Questionable) Restrictions Weight Bearing Restrictions: No    Mobility  Bed Mobility               General bed mobility comments: Pt sitting up in recliner upon PT arrival.   Transfers Overall transfer level: Needs assistance Equipment used: 1 person hand held assist Transfers: Sit to/from Stand Sit to Stand: Min guard         General transfer comment: Pt was able to power-up to full stand without assistance, however hands-on guarding provided for safety.   Ambulation/Gait Ambulation/Gait assistance: Min guard Gait Distance (Feet): 200 Feet Assistive device: Rolling walker (2 wheeled) Gait  Pattern/deviations: Step-through pattern;Decreased stride length;Trunk flexed Gait velocity: Decreased Gait velocity interpretation: <1.8 ft/sec, indicate of risk for recurrent falls General Gait Details: Pt was able to ambulate well in hallway this session. Based on performance on eval, offered a chair follow however pt adamant that she will not be using it. VC's for improved posture and closer walker proximity.    Stairs             Wheelchair Mobility    Modified Rankin (Stroke Patients Only)       Balance Overall balance assessment: Needs assistance Sitting-balance support: Single extremity supported;Feet supported Sitting balance-Leahy Scale: Fair Sitting balance - Comments: min guard for safety, preference to UE support   Standing balance support: Bilateral upper extremity supported;During functional activity Standing balance-Leahy Scale: Poor Standing balance comment: relaint on support                            Cognition Arousal/Alertness: Awake/alert Behavior During Therapy: WFL for tasks assessed/performed Overall Cognitive Status: Within Functional Limits for tasks assessed                                        Exercises      General Comments        Pertinent Vitals/Pain Pain Assessment: Faces Faces Pain Scale: Hurts even more Pain Location: Neck and head Pain Descriptors / Indicators: Aching Pain Intervention(s): Monitored during session    Home Living  Prior Function            PT Goals (current goals can now be found in the care plan section) Acute Rehab PT Goals Patient Stated Goal: to get to rehab PT Goal Formulation: With patient Time For Goal Achievement: 09/01/18 Potential to Achieve Goals: Good Progress towards PT goals: Progressing toward goals    Frequency    Min 3X/week      PT Plan Current plan remains appropriate    Co-evaluation              AM-PAC  PT "6 Clicks" Mobility   Outcome Measure  Help needed turning from your back to your side while in a flat bed without using bedrails?: None Help needed moving from lying on your back to sitting on the side of a flat bed without using bedrails?: A Little Help needed moving to and from a bed to a chair (including a wheelchair)?: A Little Help needed standing up from a chair using your arms (e.g., wheelchair or bedside chair)?: A Little Help needed to walk in hospital room?: A Lot Help needed climbing 3-5 steps with a railing? : Total 6 Click Score: 16    End of Session Equipment Utilized During Treatment: Gait belt Activity Tolerance: Patient limited by pain;Patient limited by fatigue Patient left: in chair;with call bell/phone within reach;with chair alarm set;with nursing/sitter in room Nurse Communication: Mobility status PT Visit Diagnosis: Unsteadiness on feet (R26.81);Pain Pain - Right/Left: Left Pain - part of body: Hip(and back)     Time: 9728-2060 PT Time Calculation (min) (ACUTE ONLY): 18 min  Charges:  $Gait Training: 8-22 mins                     [name], PT, DPT Acute Rehabilitation Services Pager: [phone] Office: [phone]    [name] 08/19/2018, 2:20 PM

## 2018-08-19 NOTE — Progress Notes (Signed)
STROKE TEAM PROGRESS NOTE   HISTORY OF PRESENT ILLNESS (per Dr Cheral Marker) Meredith Gonzalez a83 y.o.lady with PMH of CAD PCI/ stent, Pafib on apixaban, h/o pacer [id number], CHF (diastolic), previous stroke and s/p R CEA (06/03/18), posterior circulation TIA 07/09/18, Dm2 with retinopathy, Hypothyroidism. On 5/25 she states she felt dizzy and off balance, but had "generalized" weakness. She does note that her speech was "off". She cannot have MRI d/t pacer. CTH show only chronic small vessel dz, no acute einfarcts. Pt denies headache, seizure, vision change, cp, palp, sob, n/v, abd pain, diarrhea, brbpr, dysuria, hematuria.   Subjective History Admitted with neurological deficits including slurred speech and generalized weakness, whichmostly still persists. Patient was also noted to have hypotension. CTH neg for acute findings, can't MRI. CTA shows L ICA with atheromatous stenosis, R CEA patent.   OBJECTIVE Vitals:   08/18/18 2343 08/19/18 0342 08/19/18 0801 08/19/18 1228  BP: 109/64 (!) 119/54 (!) 112/54 (!) 109/43  Pulse:   71 75  Resp:   18 18  Temp: 97.7 F (36.5 C) 97.7 F (36.5 C) 98.1 F (36.7 C) 98.5 F (36.9 C)  TempSrc: Oral Oral Oral Oral  SpO2:   97% 96%  Weight:      Height:        CBC:  Recent Labs  Lab 08/17/18 1507 08/18/18 0334 08/19/18 0359  WBC 7.6 6.3 6.0  NEUTROABS 5.7  --   --   HGB 13.5 11.7* 11.1*  HCT 43.5 37.5 36.2  MCV 106.1* 106.8* 107.1*  PLT 194 172 546    Basic Metabolic Panel:  Recent Labs  Lab 08/18/18 0334 08/19/18 0359  NA 141 143  K 3.3* 4.1  CL 110 115*  CO2 24 22  GLUCOSE 152* 101*  BUN 24* 19  CREATININE 1.00 0.75  CALCIUM 8.0* 8.0*    Lipid Panel:     Component Value Date/Time   CHOL 100 08/18/2018 0334   CHOL 95 (L) 03/20/2017 0728   TRIG 38 08/18/2018 0334   HDL 48 08/18/2018 0334   HDL 58 03/20/2017 0728   CHOLHDL 2.1 08/18/2018 0334   VLDL 8 08/18/2018 0334   LDLCALC 44 08/18/2018 0334   LDLCALC 28 03/20/2017 0728    HgbA1c:  Lab Results  Component Value Date   HGBA1C 5.5 08/18/2018   Urine Drug Screen: No results found for: LABOPIA, COCAINSCRNUR, LABBENZ, AMPHETMU, THCU, LABBARB  Alcohol Level     Component Value Date/Time   ETH <10 04/24/2018 0545    IMAGING   Ct Angio Head W Or Wo Contrast  Result Date: 08/18/2018 CLINICAL DATA:  Initial evaluation for acute slurred speech, difficulty walking. EXAM: CT ANGIOGRAPHY HEAD AND NECK TECHNIQUE: Multidetector CT imaging of the head and neck was performed using the standard protocol during bolus administration of intravenous contrast. Multiplanar CT image reconstructions and MIPs were obtained to evaluate the vascular anatomy. Carotid stenosis measurements (when applicable) are obtained utilizing NASCET criteria, using the distal internal carotid diameter as the denominator. CONTRAST:  38mL OMNIPAQUE IOHEXOL 350 MG/ML SOLN COMPARISON:  Prior head CT from earlier same day. FINDINGS: CTA NECK FINDINGS Aortic arch: Visualized aortic arch of normal caliber with normal 3 vessel morphology. Mild atheromatous change about the arch and origin of the great vessels without flow-limiting stenosis. Visualized subclavian arteries widely patent. Right carotid system: Right common and internal carotid arteries widely patent without stenosis, dissection, or occlusion. Widely patent right endarterectomy. Left carotid system: Left common carotid artery tortuous but widely patent to  the bifurcation. Bulky calcified plaque about the left bifurcation with associated stenosis of up to 50% by NASCET criteria, stable. No made of approximate 50% stenosis at the origin of the left external carotid artery as well. Left ICA widely patent distally to the skull base without stenosis, dissection or occlusion. Vertebral arteries: Both vertebral arteries arise from the subclavian arteries. Left vertebral artery dominant. Vertebral arteries widely patent within the neck without stenosis,  dissection or occlusion. 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 Associated chronic fractures through the anterior and posterior arches of C1 noted as well. Prior fusion at C5-C7. No acute osseous abnormality. No worrisome osseous lesions. Patient is edentulous. Other neck: No other acute soft tissue abnormality within the neck. Upper chest: Chronic irregular pleuroparenchymal thickening with architectural distortion involving the right greater than left upper lobes noted, stable from previous. Partially visualized upper chest demonstrates no acute finding. Review of the MIP images confirms the above findings CTA HEAD FINDINGS Anterior circulation: Petrous segments widely patent. Mild atheromatous plaque within the cavernous/supraclinoid ICAs without hemodynamically significant stenosis. A1 segments widely patent. Normal anterior communicating artery. Anterior cerebral arteries widely patent to their distal aspects. No M1 stenosis or occlusion. Normal MCA bifurcations. Distal MCA branches well perfused and symmetric. Posterior circulation: Vertebral arteries widely patent to the vertebrobasilar junction without stenosis. Posterior inferior cerebral arteries patent bilaterally. Basilar widely patent to its distal aspect. Superior cerebellar and posterior cerebral arteries widely patent bilaterally. Venous sinuses: Patent. Anatomic variants: None significant. Delayed phase: No abnormal enhancement. Review of the MIP images confirms the above findings IMPRESSION: 1. Stable CTA of the head and neck. No large vessel occlusion or other acute vascular abnormality. 2. Approximate 50% atheromatous stenosis at the origin of the left ICA, stable. 3. Widely patent right carotid endarterectomy. 4. 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 Electronically Signed   By: Jeannine Boga M.D.   On: 08/18/2018  01:59   Ct Head Wo Contrast  Result Date: 08/17/2018 CLINICAL DATA:  Bilateral upper extremity weakness and slurred speech since last night. History of diabetes, syncope and myocardial infarction. EXAM: CT HEAD WITHOUT CONTRAST TECHNIQUE: Contiguous axial images were obtained from the base of the skull through the vertex without intravenous contrast. COMPARISON:  CT head 07/09/2018. FINDINGS: Brain: There is no evidence of acute intracranial hemorrhage, mass lesion, brain edema or extra-axial fluid collection. There is stable mild atrophy with prominence of the ventricles and subarachnoid spaces. Chronic small vessel ischemic changes in the periventricular white matter appear unchanged. There is no CT evidence of acute cortical infarction. Vascular: Intracranial vascular calcifications. No hyperdense vessel identified. Skull: Negative for fracture or focal lesion. Sinuses/Orbits: The visualized paranasal sinuses and mastoid air cells are clear. Previous bilateral lens surgery. No acute orbital abnormalities are seen. Other: None. IMPRESSION: Stable head CT without acute intracranial findings. Stable mild atrophy and chronic small vessel ischemic changes. Electronically Signed   By: Richardean Sale M.D.   On: 08/17/2018 16:26   Ct Angio Neck W Or Wo Contrast  Result Date: 08/18/2018 CLINICAL DATA:  Initial evaluation for acute slurred speech, difficulty walking. EXAM: CT ANGIOGRAPHY HEAD AND NECK TECHNIQUE: Multidetector CT imaging of the head and neck was performed using the standard protocol during bolus administration of intravenous contrast. Multiplanar CT image reconstructions and MIPs were obtained to evaluate the vascular anatomy. Carotid stenosis measurements (when applicable) are obtained utilizing NASCET criteria, using  the distal internal carotid diameter as the denominator. CONTRAST:  65mL OMNIPAQUE IOHEXOL 350 MG/ML SOLN COMPARISON:  Prior head CT from earlier same day. FINDINGS: CTA NECK  FINDINGS Aortic arch: Visualized aortic arch of normal caliber with normal 3 vessel morphology. Mild atheromatous change about the arch and origin of the great vessels without flow-limiting stenosis. Visualized subclavian arteries widely patent. Right carotid system: Right common and internal carotid arteries widely patent without stenosis, dissection, or occlusion. Widely patent right endarterectomy. Left carotid system: Left common carotid artery tortuous but widely patent to the bifurcation. Bulky calcified plaque about the left bifurcation with associated stenosis of up to 50% by NASCET criteria, stable. No made of approximate 50% stenosis at the origin of the left external carotid artery as well. Left ICA widely patent distally to the skull base without stenosis, dissection or occlusion. Vertebral arteries: Both vertebral arteries arise from the subclavian arteries. Left vertebral artery dominant. Vertebral arteries widely patent within the neck without stenosis, dissection or occlusion. 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 Associated chronic fractures through the anterior and posterior arches of C1 noted as well. Prior fusion at C5-C7. No acute osseous abnormality. No worrisome osseous lesions. Patient is edentulous. Other neck: No other acute soft tissue abnormality within the neck. Upper chest: Chronic irregular pleuroparenchymal thickening with architectural distortion involving the right greater than left upper lobes noted, stable from previous. Partially visualized upper chest demonstrates no acute finding. Review of the MIP images confirms the above findings CTA HEAD FINDINGS Anterior circulation: Petrous segments widely patent. Mild atheromatous plaque within the cavernous/supraclinoid ICAs without hemodynamically significant stenosis. A1 segments widely patent. Normal anterior communicating artery. Anterior cerebral arteries widely patent to their distal aspects. No M1  stenosis or occlusion. Normal MCA bifurcations. Distal MCA branches well perfused and symmetric. Posterior circulation: Vertebral arteries widely patent to the vertebrobasilar junction without stenosis. Posterior inferior cerebral arteries patent bilaterally. Basilar widely patent to its distal aspect. Superior cerebellar and posterior cerebral arteries widely patent bilaterally. Venous sinuses: Patent. Anatomic variants: None significant. Delayed phase: No abnormal enhancement. Review of the MIP images confirms the above findings IMPRESSION: 1. Stable CTA of the head and neck. No large vessel occlusion or other acute vascular abnormality. 2. Approximate 50% atheromatous stenosis at the origin of the left ICA, stable. 3. Widely patent right carotid endarterectomy. 4. 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 Electronically Signed   By: Jeannine Boga M.D.   On: 08/18/2018 01:59   Ct Abdomen Pelvis W Contrast  Result Date: 08/17/2018 CLINICAL DATA:  Epigastric pain with nausea. EXAM: CT ABDOMEN AND PELVIS WITH CONTRAST TECHNIQUE: Multidetector CT imaging of the abdomen and pelvis was performed using the standard protocol following bolus administration of intravenous contrast. CONTRAST:  155mL OMNIPAQUE IOHEXOL 300 MG/ML  SOLN COMPARISON:  No comparison studies available. FINDINGS: Lower chest: Chronic interstitial changes noted in the lower lungs bilaterally. No pleural effusion. Hepatobiliary: No suspicious focal abnormality within the liver parenchyma. Gallbladder is surgically absent. No intrahepatic or extrahepatic biliary dilation. Pancreas: No focal mass lesion. No dilatation of the main duct. No intraparenchymal cyst. No peripancreatic edema. Spleen: No splenomegaly. No focal mass lesion. Adrenals/Urinary Tract: No adrenal nodule or mass. Right kidney unremarkable. 8 mm well-defined low-density lesion in the  interpolar left kidney is likely a cyst. No evidence for hydroureter. The urinary bladder appears normal for the degree of distention. Stomach/Bowel: Status  post gastric bypass. Duodenum is normally positioned as is the ligament of Treitz. No small bowel wall thickening. No small bowel dilatation. The terminal ileum is normal. The appendix is 5 mm diameter without evidence for periappendiceal edema or inflammation. No gross colonic mass. No colonic wall thickening. Diverticular changes are noted in the left colon without evidence of diverticulitis. Vascular/Lymphatic: There is abdominal aortic atherosclerosis without aneurysm. IVC filter visualized in situ. Portal vein, superior mesenteric vein, and splenic vein are patent. There is no gastrohepatic or hepatoduodenal ligament lymphadenopathy. No intraperitoneal or retroperitoneal lymphadenopathy. No pelvic sidewall lymphadenopathy. Reproductive: The uterus is surgically absent. There is no adnexal mass. Other: No intraperitoneal free fluid. Musculoskeletal: Status post fixation in the femoral necks bilaterally. Old bilateral pubic rami fractures noted. Compression fractures evident at T12, L1, L2, L3, and L4. no ventral hernia. IMPRESSION: 1. No acute findings in the abdomen or pelvis. No findings to explain the patient's history of epigastric pain with nausea. 2.  Aortic Atherosclerois (ICD10-170.0) 3. Left colonic diverticulosis without diverticulitis. Electronically Signed   By: Misty Stanley M.D.   On: 08/17/2018 16:30   Dg Chest Portable 1 View  Result Date: 08/17/2018 CLINICAL DATA:  Weakness, confusion since yesterday EXAM: PORTABLE CHEST 1 VIEW COMPARISON:  07/16/2016 FINDINGS: There is mild bilateral chronic interstitial thickening. There is no focal parenchymal opacity. There is no pleural effusion or pneumothorax. The cardiomediastinal silhouette is stable. There is a 3 lead cardiac pacemaker. The osseous structures are unremarkable. IMPRESSION: No  active disease. Electronically Signed   By: Kathreen Devoid   On: 08/17/2018 16:33     Transthoracic Echocardiogram  1. The left ventricle has normal systolic function with an ejection fraction of 60-65%. The cavity size was normal. There is mild concentric left ventricular hypertrophy. Left ventricular diastolic Doppler parameters are consistent with pseudonormalization. Elevated mean left atrial pressure No evidence of left ventricular regional wall motion abnormalities. 2. The right ventricle has normal systolic function. The cavity was normal. There is no increase in right ventricular wall thickness. Right ventricular systolic pressure is normal with an estimated pressure of 26.2 mmHg. 3. Left atrial size was mildly dilated. 4. Tricuspid valve regurgitation is mild-moderate. 5. The aortic valve is tricuspid. Mild thickening of the aortic valve. Mild calcification of the aortic valve.  PHYSICAL EXAM Blood pressure (!) 109/43, pulse 75, temperature 98.5 F (36.9 C), temperature source Oral, resp. rate 18, height 5\' 5"  (1.651 m), weight 57.6 kg, SpO2 96 %.  Constitutional: Frail elderly Caucasian lady not in distress psych: Affect appropriate to situation Eyes: No scleral injection HENT: No OP obstrucion Head: Normocephalic.  Cardiovascular: Normal rate and regular rhythm.  Respiratory: Effort normal, non-labored breathing GI: Soft.  No distension. There is no tenderness.  Skin: WDI  Neuro: Mental Status: Patient is awake, alert, oriented to person, place, month, year, and situation. Patient is able to give a clear and coherent history. No signs of aphasia or neglect Speech mostly normal with occasional stuttering but with distractibility reverts back to being normal.   Cranial Nerves: II: Visual Fields are full. PERRL.  III,IV, VI: EOMI without ptosis or diplopia.  V: Facial sensation is symmetric to temperature VII: Facial movement is symmetric.  VIII: hearing is intact to  voice X: Palat elevates symmetrically XI: Shoulder shrug is symmetric. XII: tongue is midline without atrophy or fasciculations.  Motor: Tone is normal. Bulk is normal. 5/5 strength was present in all four extremities.  Sensory: Sensation is symmetric to light touch,  temperature sensation decreased from feet up to mid-calf bilaterally. Deep Tendon Reflexes: 2+ and symmetric in the biceps and patellae.  Plantars: Toes are downgoing bilaterally.  Cerebellar: FNF dysmetric bilaterally with left greater than right. HKS intact bilaterally  HOME MEDICATIONS:  Medications Prior to Admission  Medication Sig Dispense Refill  . acetaminophen (TYLENOL) 500 MG tablet Take 500 mg by mouth every 6 (six) hours as needed for moderate pain.     Marland Kitchen albuterol (PROVENTIL HFA;VENTOLIN HFA) 108 (90 Base) MCG/ACT inhaler Inhale 2 puffs into the lungs every 6 (six) hours as needed for wheezing or shortness of breath. 1 Inhaler 1  . amiodarone (PACERONE) 200 MG tablet Take 1 tablet (200 mg total) by mouth every morning. 90 tablet 3  . apixaban (ELIQUIS) 2.5 MG TABS tablet Take 1 tablet (2.5 mg total) by mouth 2 (two) times daily.    Marland Kitchen atorvastatin (LIPITOR) 20 MG tablet Take 1 tablet (20 mg total) by mouth daily for 30 days. 30 tablet 0  . budesonide-formoterol (SYMBICORT) 160-4.5 MCG/ACT inhaler Inhale 2 puffs into the lungs 2 (two) times daily. 1 Inhaler 5  . calcitonin, salmon, (MIACALCIN/FORTICAL) 200 UNIT/ACT nasal spray Place 1 spray into the nose daily.    Marland Kitchen docusate sodium (COLACE) 100 MG capsule Take 1 capsule (100 mg total) by mouth daily. 30 capsule 0  . ferrous sulfate 325 (65 FE) MG tablet Take 1 tablet (325 mg total) by mouth 2 (two) times daily with a meal. 60 tablet 0  . flintstones complete (FLINTSTONES) 60 MG chewable tablet Chew 1 tablet by mouth daily.    Marland Kitchen gabapentin (NEURONTIN) 300 MG capsule Take 1-2 capsules (300-600 mg total) by mouth See admin instructions. Take 1 capsule (300 mg) by  mouth in the mornning & take 2 capsules (600 mg) by mouth at night 90 capsule 0  . levothyroxine (SYNTHROID, LEVOTHROID) 75 MCG tablet Take 1 tablet (75 mcg total) by mouth daily at 6 (six) AM. 30 tablet 1  . nitroGLYCERIN (NITROSTAT) 0.4 MG SL tablet Place 1 tablet (0.4 mg total) under the tongue every 5 (five) minutes as needed for chest pain. 25 tablet 2  . pantoprazole (PROTONIX) 40 MG tablet Take 1 tablet (40 mg total) by mouth daily. 30 tablet 1  . vitamin B-12 (CYANOCOBALAMIN) 1000 MCG tablet Take 1 tablet (1,000 mcg total) by mouth daily. 30 tablet 0      HOSPITAL MEDICATIONS:  . amiodarone  200 mg Oral q morning - 10a  . apixaban  2.5 mg Oral BID  . atorvastatin  20 mg Oral Daily  . calcitonin (salmon)  1 spray Alternating Nares Daily  . diclofenac sodium  2 g Topical QID  . docusate sodium  100 mg Oral Daily  . feeding supplement (ENSURE ENLIVE)  237 mL Oral BID BM  . ferrous sulfate  325 mg Oral BID WC  . gabapentin  300 mg Oral q morning - 10a  . gabapentin  600 mg Oral QHS  . insulin aspart  0-9 Units Subcutaneous TID WC  . levothyroxine  75 mcg Oral Q0600  . mometasone-formoterol  2 puff Inhalation BID  . multivitamin with minerals  1 tablet Oral Daily  . pantoprazole  40 mg Oral Daily  . tamsulosin  0.4 mg Oral Daily  . vitamin B-12  1,000 mcg Oral Daily    ALLERGIES Allergies  Allergen Reactions  . Valium [Diazepam] Anxiety    Makes patient hyper  . Robaxin [Methocarbamol] Other (See Comments)  Makes pt loopy/acts drunk  . Tramadol Other (See Comments)    sedation  . Codeine Hives and Itching    Can take with Benadryl   . Darifenacin Itching    Can take with Benadryl  . Darvon [Propoxyphene Hcl] Itching    Can take with Benadryl  . Daypro [Oxaprozin] Itching    Can take with Benadryl  . Enablex [Darifenacin Hydrobromide Er] Itching    Can take with Benadryl  . Oxycodone Itching    Can take with Benadryl   . Propoxyphene Itching    Can take with  Benadryl  . Risperdal [Risperidone] Itching    Can take with Benadryl  . Talwin [Pentazocine] Itching    Can take with Benadryl  . Vicodin [Hydrocodone-Acetaminophen] Itching    Can take with Benadryl   ASSESSMENT/PLAN [name] is a 83 y.o. female presenting with dizziness and generalized weakness, abnormal speech. She did not receive IV t-PA due to outside of time window.   TIA possible; unable to do MR to r/o small stroke  Resultant  Dizziness, slurred speech, gen weakness  CT head- ischemic small vessel dz  MRI head - Unable d/t Pacer  CTA H&N - RCEA is patent; L ICA has ~50% atheromatous stenosis noted  2D Echo - mild LVH, EF 60%  [place] Corona Virus 2 neg  LDL - 44  HgbA1c - 5.5  UDS - n/a  VTE prophylaxis - on Abixipan BID for anticoagulation already  Diet per SLP  Apixaban 2.5mg  BID prior to admission, now on Abixaban 2.5mg  BID  Patient counseled to be compliant with her antithrombotic medications  Ongoing aggressive stroke risk factor management  Therapy recommendations:  SNF  Disposition:  Pending  Hypertension  Has actually been hypotensive .  Permissive hypertension (OK if < 220/120) but gradually normalize in 5-7 days .  Long-term BP goal normotensive  Hyperlipidemia  Lipid lowering medication PTA:  Lipitor 20  LDL 44, goal < 70  Current lipid lowering medication:Lipitor 20mg , no reason to increase home dose d/t age and under goal  Continue statin at discharge  Diabetes  HgbA1c 5.5, goal < 7.0  Controlled  Other Stroke Risk Factors  Advanced age  Hx stroke/TIA  Prev tobacco smoker  Coronary artery disease  Other Active Problems  Neck pain r/t old cervical fx  Afib- low dose of Abixiban (2.5mg  BID is for age over 14 + <60kg + Cr >1.5.) She has 2 of the 3; will need ongoing out pt monitoring of above criteria.  Hospital day # 1  Desiree Metzger-Cihelka, ARNP-C, ANVP-BC  I have personally obtained history,examined  this patient, reviewed notes, independently viewed imaging studies, participated in medical decision making and plan of care.ROS completed by me personally and pertinent positives fully documented  I have made any additions or clarifications directly to the above note.  She presented with nonspecific symptoms of dizziness and generalized weakness and admitted for possible stroke.  Initial CT scan of the head is unremarkable and MRI cannot be done due to pacemaker.  Recommend repeat CT scan tomorrow.  Continue ongoing stroke work-up.  Continue Eliquis and the current dosage.  Aggressive risk factor modification.  Discussed with Dr. Ree Kida.  Greater than 50% time during this 35-minute visit was spent on counseling and coordination of care about TIA and stroke and atrial fibrillation and answering questions Antony Contras, MD Medical Director [place] Pager: [phone] 08/19/2018 5:22 PM  To contact Stroke Continuity provider, please refer to  [email or web address] After hours, contact General Neurology

## 2018-08-19 NOTE — Progress Notes (Signed)
PROGRESS NOTE    Meredith Gonzalez  GGE:[id number] DOB: January 03, 1936 DOA: 08/17/2018 PCP: Sinclair Ship, MD   Brief Narrative:  HPI On 08/17/2018 by Dr. Jani Gravel Rosealie Meredith Gonzalez  is a 83 y.o. female,w CAD PCI/ stent, Pafib on apixaban, h/o pacer 4650, CHF (diastolic),   h/o CVA s/p R CEA (06/03/18), posterior circulation TIA 07/09/18,  Dm2 with retinopathy, Hypothyroidism apparently c/o difficulty walking secondary to bilateral leg weakness starting last nite along with slurred speech.  Pt states that her difficulty walking was worse this morning and therefore presented [facility] ED.  Pt denies headache, seizure, vision change, cp, palp, sob, n/v, abd pain, diarrhea, brbpr, dysuria, hematuria.    Interim history Admitted with neurological deficits including slurred speech and generalized weakness.  CT head unremarkable for acute malady.  Patient was also noted to have hypotension.  Neurology consulted and appreciated. Assessment & Plan   Neurological deficits including slurred speech and lower extremity weakness -TIA versus stroke rule out -CT head stable, without acute intracranial findings. -CTA head and neck: Stable CTA head and neck.  No large vessel occlusion.  Approximate 50% atheromatous stenosis at the origin of the left ICA.  Patent right carotid endarterectomy. -No MRI due to pacemaker. -hemoglobin A1c 5.5, LDL 44 -Echocardiogram: EF 60 to 65%.  Mild concentric left ventricular hypertrophy.  LV diastolic Doppler parameters consistent with pseudonormalization. -Neurology consulted and appreciated-recommended repeat CT scan in 48 to 72 hours -PT, OT recommending SNF  Hypotension, dehydration -Patient required IV fluids -TSH 8.890 -AM Cortisol level 14.1  Paroxysmal atrial fibrillation -Continue amiodarone, Eliquis  History of CVA, posterior circulation -Continue Eliquis  Diabetes mellitus, type II -Continue insulin sliding scale with CBG monitoring  Hypothyroidism -Continue  Synthroid -TSH 8.890 -will obtain FT4  Asthma -Stable, continue albuterol as needed  Urinary retention -Placed on Flomax -UA unremarkable for infection  Hypokalemia -resolved with replacement  Headaches, neck pain -continue Tylenol as needed -Will add on Voltaren gel -Requested cervical soft collar  DVT Prophylaxis  Eliquis  Code Status: DNR  Family Communication: None at bedside  Disposition Plan: Admitted. Pending repeat CT head and further neurology recommendations.   Consultants Neurology  Procedures  Echocardiogram  Antibiotics   Anti-infectives (From admission, onward)   None      Subjective:   [name] seen and examined today.  Patient with mild neck complaints.  Denies current chest pain, shortness breath, abdominal pain, nausea or vomiting, diarrhea constipation, dizziness.  Has headache occasionally.  She feels that she drinks plenty of fluids at home, although she does like to drink Pepsi and Coke.  Objective:   Vitals:   08/18/18 2343 08/19/18 0342 08/19/18 0801 08/19/18 1228  BP: 109/64 (!) 119/54 (!) 112/54 (!) 109/43  Pulse:   71 75  Resp:   18 18  Temp: 97.7 F (36.5 C) 97.7 F (36.5 C) 98.1 F (36.7 C) 98.5 F (36.9 C)  TempSrc: Oral Oral Oral Oral  SpO2:   97% 96%  Weight:      Height:        Intake/Output Summary (Last 24 hours) at 08/19/2018 1349 Last data filed at 08/19/2018 1300 Gross per 24 hour  Intake 2423.51 ml  Output --  Net 2423.51 ml   Filed Weights   08/17/18 1418 08/17/18 2000  Weight: 51.3 kg 57.6 kg    Exam  General: Well developed, well nourished, NAD  HEENT: NCAT, mucous membranes moist.   Neck: Supple  Cardiovascular: S1 S2 auscultated, RRR  Respiratory:  Clear to auscultation bilaterally   Abdomen: Soft, nontender, nondistended, + bowel sounds  Extremities: warm dry without cyanosis clubbing or edema  Neuro: AAOx3, Nonfocal   Psych: Appropriate mood and affect, pleasant    Data  Reviewed: I have personally reviewed following labs and imaging studies  CBC: Recent Labs  Lab 08/17/18 1507 08/18/18 0334 08/19/18 0359  WBC 7.6 6.3 6.0  NEUTROABS 5.7  --   --   HGB 13.5 11.7* 11.1*  HCT 43.5 37.5 36.2  MCV 106.1* 106.8* 107.1*  PLT 194 172 073   Basic Metabolic Panel: Recent Labs  Lab 08/17/18 1507 08/18/18 0334 08/19/18 0359  NA 142 141 143  K 3.0* 3.3* 4.1  CL 104 110 115*  CO2 26 24 22   GLUCOSE 106* 152* 101*  BUN 31* 24* 19  CREATININE 1.00 1.00 0.75  CALCIUM 8.4* 8.0* 8.0*   GFR: Estimated Creatinine Clearance: 49.6 mL/min (by C-G formula based on SCr of 0.75 mg/dL). Liver Function Tests: Recent Labs  Lab 08/17/18 1507 08/18/18 0334  AST 20 19  ALT 17 14  ALKPHOS 98 83  BILITOT 0.6 0.5  PROT 7.1 5.6*  ALBUMIN 3.6 2.7*   Recent Labs  Lab 08/17/18 1507  LIPASE 31   No results for input(s): AMMONIA in the last 168 hours. Coagulation Profile: Recent Labs  Lab 08/17/18 1555  INR 1.3*   Cardiac Enzymes: Recent Labs  Lab 08/17/18 1507  TROPONINI <0.03   BNP (last 3 results) No results for input(s): PROBNP in the last 8760 hours. HbA1C: Recent Labs    08/18/18 0334  HGBA1C 5.5   CBG: Recent Labs  Lab 08/17/18 1418 08/18/18 1731 08/18/18 2140 08/19/18 0743 08/19/18 1157  GLUCAP 110* 92 115* 81 93   Lipid Profile: Recent Labs    08/18/18 0334  CHOL 100  HDL 48  LDLCALC 44  TRIG 38  CHOLHDL 2.1   Thyroid Function Tests: Recent Labs    08/19/18 0359  TSH 8.890*   Anemia Panel: No results for input(s): VITAMINB12, FOLATE, FERRITIN, TIBC, IRON, RETICCTPCT in the last 72 hours. Urine analysis:    Component Value Date/Time   COLORURINE YELLOW 08/18/2018 [place] 08/18/2018 0657   LABSPEC >1.046 (H) 08/18/2018 0657   PHURINE 6.0 08/18/2018 0657   GLUCOSEU NEGATIVE 08/18/2018 0657   HGBUR NEGATIVE 08/18/2018 0657   BILIRUBINUR NEGATIVE 08/18/2018 [place] 08/18/2018  0657   PROTEINUR NEGATIVE 08/18/2018 0657   NITRITE NEGATIVE 08/18/2018 [place] 08/18/2018 0657   Sepsis Labs: @LABRCNTIP (procalcitonin:4,lacticidven:4)  ) Recent Results (from the past 240 hour(s))  SARS Coronavirus 2 (Hosp order,Performed in [place] lab via [facility])     Status: None   Collection Time: 08/17/18  3:07 PM  Result Value Ref Range Status   SARS Coronavirus 2 ([facility] Now) NEGATIVE NEGATIVE Final    Comment: (NOTE) Interpretive Result Comment(s): COVID 19 Positive SARS CoV 2 target nucleic acids are DETECTED. The SARS CoV 2 RNA is generally detectable in upper and lower respiratory specimens during the acute phase of infection.  Positive results are indicative of active infection with SARS CoV 2.  Clinical correlation with patient history and other diagnostic information is necessary to determine patient infection status.  Positive results do not rule out bacterial infection or coinfection with other viruses. The expected result is Negative. COVID 19 Negative SARS CoV 2 target nucleic acids are NOT DETECTED. The SARS CoV 2 RNA is generally  detectable in upper and lower respiratory specimens during the acute phase of infection.  Negative results do not preclude SARS CoV 2 infection, do not rule out coinfections with other pathogens, and should not be used as the sole basis for treatment or other patient management decisions.  Negative results must be combined with clinical  observations, patient history, and epidemiological information. The expected result is Negative. Invalid Presence or absence of SARS CoV 2 nucleic acids cannot be determined. Repeat testing was performed on the submitted specimen and repeated Invalid results were obtained.  If clinically indicated, additional testing on a new specimen with an alternate test methodology [phone]) is advised.  The SARS CoV 2 RNA is generally detectable in upper and lower respiratory  specimens during the acute phase of infection. The expected result is Negative. Fact Sheet for Patients:  GolfingFamily.no Fact Sheet for Healthcare Providers: https://www.hernandez-brewer.com/ This test is not yet approved or cleared by the [place] FDA and has been authorized for detection and/or diagnosis of SARS CoV 2 by FDA under an Emergency Use Authorization (EUA).  This EUA will remain in effect (meaning this test can be used) for the duration of the COVID19 d eclaration under Section 564(b)(1) of the Act, 21 U.S.C. section [phone] 3(b)(1), unless the authorization is terminated or revoked sooner. Performed at [facility], [address], [place], [place] [address]       Radiology Studies: Ct Angio Head W Or Wo Contrast  Result Date: 08/18/2018 CLINICAL DATA:  Initial evaluation for acute slurred speech, difficulty walking. EXAM: CT ANGIOGRAPHY HEAD AND NECK TECHNIQUE: Multidetector CT imaging of the head and neck was performed using the standard protocol during bolus administration of intravenous contrast. Multiplanar CT image reconstructions and MIPs were obtained to evaluate the vascular anatomy. Carotid stenosis measurements (when applicable) are obtained utilizing NASCET criteria, using the distal internal carotid diameter as the denominator. CONTRAST:  29mL OMNIPAQUE IOHEXOL 350 MG/ML SOLN COMPARISON:  Prior head CT from earlier same day. FINDINGS: CTA NECK FINDINGS Aortic arch: Visualized aortic arch of normal caliber with normal 3 vessel morphology. Mild atheromatous change about the arch and origin of the great vessels without flow-limiting stenosis. Visualized subclavian arteries widely patent. Right carotid system: Right common and internal carotid arteries widely patent without stenosis, dissection, or occlusion. Widely patent right endarterectomy. Left carotid system: Left common carotid artery tortuous but widely patent to  the bifurcation. Bulky calcified plaque about the left bifurcation with associated stenosis of up to 50% by NASCET criteria, stable. No made of approximate 50% stenosis at the origin of the left external carotid artery as well. Left ICA widely patent distally to the skull base without stenosis, dissection or occlusion. Vertebral arteries: Both vertebral arteries arise from the subclavian arteries. Left vertebral artery dominant. Vertebral arteries widely patent within the neck without stenosis, dissection or occlusion. 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 Associated chronic fractures through the anterior and posterior arches of C1 noted as well. Prior fusion at C5-C7. No acute osseous abnormality. No worrisome osseous lesions. Patient is edentulous. Other neck: No other acute soft tissue abnormality within the neck. Upper chest: Chronic irregular pleuroparenchymal thickening with architectural distortion involving the right greater than left upper lobes noted, stable from previous. Partially visualized upper chest demonstrates no acute finding. Review of the MIP images confirms the above findings CTA HEAD FINDINGS Anterior circulation: Petrous segments widely patent. Mild atheromatous plaque within the cavernous/supraclinoid ICAs without hemodynamically significant stenosis. A1  segments widely patent. Normal anterior communicating artery. Anterior cerebral arteries widely patent to their distal aspects. No M1 stenosis or occlusion. Normal MCA bifurcations. Distal MCA branches well perfused and symmetric. Posterior circulation: Vertebral arteries widely patent to the vertebrobasilar junction without stenosis. Posterior inferior cerebral arteries patent bilaterally. Basilar widely patent to its distal aspect. Superior cerebellar and posterior cerebral arteries widely patent bilaterally. Venous sinuses: Patent. Anatomic variants: None significant. Delayed phase: No abnormal enhancement.  Review of the MIP images confirms the above findings IMPRESSION: 1. Stable CTA of the head and neck. No large vessel occlusion or other acute vascular abnormality. 2. Approximate 50% atheromatous stenosis at the origin of the left ICA, stable. 3. Widely patent right carotid endarterectomy. 4. 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 Electronically Signed   By: Jeannine Boga M.D.   On: 08/18/2018 01:59   Ct Head Wo Contrast  Result Date: 08/17/2018 CLINICAL DATA:  Bilateral upper extremity weakness and slurred speech since last night. History of diabetes, syncope and myocardial infarction. EXAM: CT HEAD WITHOUT CONTRAST TECHNIQUE: Contiguous axial images were obtained from the base of the skull through the vertex without intravenous contrast. COMPARISON:  CT head 07/09/2018. FINDINGS: Brain: There is no evidence of acute intracranial hemorrhage, mass lesion, brain edema or extra-axial fluid collection. There is stable mild atrophy with prominence of the ventricles and subarachnoid spaces. Chronic small vessel ischemic changes in the periventricular white matter appear unchanged. There is no CT evidence of acute cortical infarction. Vascular: Intracranial vascular calcifications. No hyperdense vessel identified. Skull: Negative for fracture or focal lesion. Sinuses/Orbits: The visualized paranasal sinuses and mastoid air cells are clear. Previous bilateral lens surgery. No acute orbital abnormalities are seen. Other: None. IMPRESSION: Stable head CT without acute intracranial findings. Stable mild atrophy and chronic small vessel ischemic changes. Electronically Signed   By: Richardean Sale M.D.   On: 08/17/2018 16:26   Ct Angio Neck W Or Wo Contrast  Result Date: 08/18/2018 CLINICAL DATA:  Initial evaluation for acute slurred speech, difficulty walking. EXAM: CT ANGIOGRAPHY HEAD AND NECK TECHNIQUE:  Multidetector CT imaging of the head and neck was performed using the standard protocol during bolus administration of intravenous contrast. Multiplanar CT image reconstructions and MIPs were obtained to evaluate the vascular anatomy. Carotid stenosis measurements (when applicable) are obtained utilizing NASCET criteria, using the distal internal carotid diameter as the denominator. CONTRAST:  78mL OMNIPAQUE IOHEXOL 350 MG/ML SOLN COMPARISON:  Prior head CT from earlier same day. FINDINGS: CTA NECK FINDINGS Aortic arch: Visualized aortic arch of normal caliber with normal 3 vessel morphology. Mild atheromatous change about the arch and origin of the great vessels without flow-limiting stenosis. Visualized subclavian arteries widely patent. Right carotid system: Right common and internal carotid arteries widely patent without stenosis, dissection, or occlusion. Widely patent right endarterectomy. Left carotid system: Left common carotid artery tortuous but widely patent to the bifurcation. Bulky calcified plaque about the left bifurcation with associated stenosis of up to 50% by NASCET criteria, stable. No made of approximate 50% stenosis at the origin of the left external carotid artery as well. Left ICA widely patent distally to the skull base without stenosis, dissection or occlusion. Vertebral arteries: Both vertebral arteries arise from the subclavian arteries. Left vertebral artery dominant. Vertebral arteries widely patent within the neck without stenosis, dissection or occlusion. 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 Associated chronic fractures through the  anterior and posterior arches of C1 noted as well. Prior fusion at C5-C7. No acute osseous abnormality. No worrisome osseous lesions. Patient is edentulous. Other neck: No other acute soft tissue abnormality within the neck. Upper chest: Chronic irregular pleuroparenchymal thickening with architectural distortion involving the  right greater than left upper lobes noted, stable from previous. Partially visualized upper chest demonstrates no acute finding. Review of the MIP images confirms the above findings CTA HEAD FINDINGS Anterior circulation: Petrous segments widely patent. Mild atheromatous plaque within the cavernous/supraclinoid ICAs without hemodynamically significant stenosis. A1 segments widely patent. Normal anterior communicating artery. Anterior cerebral arteries widely patent to their distal aspects. No M1 stenosis or occlusion. Normal MCA bifurcations. Distal MCA branches well perfused and symmetric. Posterior circulation: Vertebral arteries widely patent to the vertebrobasilar junction without stenosis. Posterior inferior cerebral arteries patent bilaterally. Basilar widely patent to its distal aspect. Superior cerebellar and posterior cerebral arteries widely patent bilaterally. Venous sinuses: Patent. Anatomic variants: None significant. Delayed phase: No abnormal enhancement. Review of the MIP images confirms the above findings IMPRESSION: 1. Stable CTA of the head and neck. No large vessel occlusion or other acute vascular abnormality. 2. Approximate 50% atheromatous stenosis at the origin of the left ICA, stable. 3. Widely patent right carotid endarterectomy. 4. 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 Electronically Signed   By: Jeannine Boga M.D.   On: 08/18/2018 01:59   Ct Abdomen Pelvis W Contrast  Result Date: 08/17/2018 CLINICAL DATA:  Epigastric pain with nausea. EXAM: CT ABDOMEN AND PELVIS WITH CONTRAST TECHNIQUE: Multidetector CT imaging of the abdomen and pelvis was performed using the standard protocol following bolus administration of intravenous contrast. CONTRAST:  170mL OMNIPAQUE IOHEXOL 300 MG/ML  SOLN COMPARISON:  No comparison studies available. FINDINGS: Lower chest: Chronic interstitial changes  noted in the lower lungs bilaterally. No pleural effusion. Hepatobiliary: No suspicious focal abnormality within the liver parenchyma. Gallbladder is surgically absent. No intrahepatic or extrahepatic biliary dilation. Pancreas: No focal mass lesion. No dilatation of the main duct. No intraparenchymal cyst. No peripancreatic edema. Spleen: No splenomegaly. No focal mass lesion. Adrenals/Urinary Tract: No adrenal nodule or mass. Right kidney unremarkable. 8 mm well-defined low-density lesion in the interpolar left kidney is likely a cyst. No evidence for hydroureter. The urinary bladder appears normal for the degree of distention. Stomach/Bowel: Status post gastric bypass. Duodenum is normally positioned as is the ligament of Treitz. No small bowel wall thickening. No small bowel dilatation. The terminal ileum is normal. The appendix is 5 mm diameter without evidence for periappendiceal edema or inflammation. No gross colonic mass. No colonic wall thickening. Diverticular changes are noted in the left colon without evidence of diverticulitis. Vascular/Lymphatic: There is abdominal aortic atherosclerosis without aneurysm. IVC filter visualized in situ. Portal vein, superior mesenteric vein, and splenic vein are patent. There is no gastrohepatic or hepatoduodenal ligament lymphadenopathy. No intraperitoneal or retroperitoneal lymphadenopathy. No pelvic sidewall lymphadenopathy. Reproductive: The uterus is surgically absent. There is no adnexal mass. Other: No intraperitoneal free fluid. Musculoskeletal: Status post fixation in the femoral necks bilaterally. Old bilateral pubic rami fractures noted. Compression fractures evident at T12, L1, L2, L3, and L4. no ventral hernia. IMPRESSION: 1. No acute findings in the abdomen or pelvis. No findings to explain the patient's history of epigastric pain with nausea. 2.  Aortic Atherosclerois (ICD10-170.0) 3. Left colonic diverticulosis without diverticulitis. Electronically  Signed   By: Verda Cumins.D.  On: 08/17/2018 16:30   Dg Chest Portable 1 View  Result Date: 08/17/2018 CLINICAL DATA:  Weakness, confusion since yesterday EXAM: PORTABLE CHEST 1 VIEW COMPARISON:  07/16/2016 FINDINGS: There is mild bilateral chronic interstitial thickening. There is no focal parenchymal opacity. There is no pleural effusion or pneumothorax. The cardiomediastinal silhouette is stable. There is a 3 lead cardiac pacemaker. The osseous structures are unremarkable. IMPRESSION: No active disease. Electronically Signed   By: Kathreen Devoid   On: 08/17/2018 16:33     Scheduled Meds:  amiodarone  200 mg Oral q morning - 10a   apixaban  2.5 mg Oral BID   atorvastatin  20 mg Oral Daily   calcitonin (salmon)  1 spray Alternating Nares Daily   diclofenac sodium  2 g Topical QID   docusate sodium  100 mg Oral Daily   feeding supplement (ENSURE ENLIVE)  237 mL Oral BID BM   ferrous sulfate  325 mg Oral BID WC   gabapentin  300 mg Oral q morning - 10a   gabapentin  600 mg Oral QHS   insulin aspart  0-9 Units Subcutaneous TID WC   levothyroxine  75 mcg Oral Q0600   mometasone-formoterol  2 puff Inhalation BID   multivitamin with minerals  1 tablet Oral Daily   pantoprazole  40 mg Oral Daily   tamsulosin  0.4 mg Oral Daily   vitamin B-12  1,000 mcg Oral Daily   Continuous Infusions:  sodium chloride 75 mL/hr at 08/19/18 0134     LOS: 1 day   Time Spent in minutes   30 minutes  Erico Stan D.O. on 08/19/2018 at 1:49 PM  Between 7am to 7pm - Please see pager noted on [email or web address]  After 7pm go to [email or web address]  And look for the night coverage person covering for me after hours  [facility] Office  [phone]

## 2018-08-19 NOTE — TOC Progression Note (Signed)
Transition of Care [facility]) - Progression Note    Patient Details  Name: Meredith Gonzalez MRN: [id number] Date of Birth: [birth year]  Transition of Care [facility]) CM/SW [place], [place] Phone Number: 08/19/2018, 3:20 PM  Clinical Narrative:   CSW met with patient to discuss bed offers. CSW received word from [facility] that they will not have a bed for the patient, CSW informed patient. Patient chose [place]. CSW reached out to [facility] about bed availability, and they will have a bed for patient on Friday. Patient asked CSW to inform the patient's son. CSW updated son, Meredith Gonzalez. CSW to follow.    Expected Discharge Plan: [place] Barriers to Discharge: Continued Medical Work up  Expected Discharge Plan and Services Expected Discharge Plan: [place] Choice: [place] arrangements for the past 2 months: Single Family Home, [place]                                       Social Determinants of Health (SDOH) Interventions    Readmission Risk Interventions No flowsheet data found.

## 2018-08-20 ENCOUNTER — Inpatient Hospital Stay (HOSPITAL_COMMUNITY): Payer: Medicare Other

## 2018-08-20 LAB — GLUCOSE, CAPILLARY
Glucose-Capillary: 71 mg/dL (ref 70–99)
Glucose-Capillary: 121 mg/dL — ABNORMAL HIGH (ref 70–99)
Glucose-Capillary: 76 mg/dL (ref 70–99)
Glucose-Capillary: 171 mg/dL — ABNORMAL HIGH (ref 70–99)

## 2018-08-20 NOTE — Progress Notes (Signed)
STROKE TEAM PROGRESS NOTE    .   Subjective History She states her dizziness is improved but generalized weakness is not yet back to baseline.  Repeat CT scan of the head shows no acute infarct. OBJECTIVE Vitals:   08/20/18 0756 08/20/18 0917 08/20/18 1209 08/20/18 1649  BP: (!) 152/71  (!) 111/49 (!) 118/57  Pulse: 70  70 75  Resp: 16  18 16   Temp: 97.8 F (36.6 C)  97.8 F (36.6 C) 98.2 F (36.8 C)  TempSrc: Oral  Oral Oral  SpO2: 100% 98% 98% 100%  Weight:      Height:        CBC:  Recent Labs  Lab 08/17/18 1507 08/18/18 0334 08/19/18 0359  WBC 7.6 6.3 6.0  NEUTROABS 5.7  --   --   HGB 13.5 11.7* 11.1*  HCT 43.5 37.5 36.2  MCV 106.1* 106.8* 107.1*  PLT 194 172 970    Basic Metabolic Panel:  Recent Labs  Lab 08/18/18 0334 08/19/18 0359  NA 141 143  K 3.3* 4.1  CL 110 115*  CO2 24 22  GLUCOSE 152* 101*  BUN 24* 19  CREATININE 1.00 0.75  CALCIUM 8.0* 8.0*    Lipid Panel:     Component Value Date/Time   CHOL 100 08/18/2018 0334   CHOL 95 (L) 03/20/2017 0728   TRIG 38 08/18/2018 0334   HDL 48 08/18/2018 0334   HDL 58 03/20/2017 0728   CHOLHDL 2.1 08/18/2018 0334   VLDL 8 08/18/2018 0334   LDLCALC 44 08/18/2018 0334   LDLCALC 28 03/20/2017 0728   HgbA1c:  Lab Results  Component Value Date   HGBA1C 5.5 08/18/2018   Urine Drug Screen: No results found for: LABOPIA, COCAINSCRNUR, LABBENZ, AMPHETMU, THCU, LABBARB  Alcohol Level     Component Value Date/Time   ETH <10 04/24/2018 0545    IMAGING   Ct Head Wo Contrast  Result Date: 08/20/2018 CLINICAL DATA:  Stroke follow-up EXAM: CT HEAD WITHOUT CONTRAST TECHNIQUE: Contiguous axial images were obtained from the base of the skull through the vertex without intravenous contrast. COMPARISON:  Three days ago FINDINGS: Brain: No evidence of acute infarction, hemorrhage, hydrocephalus, extra-axial collection or mass lesion/mass effect. Chronic small vessel ischemia with confluent low-density in the  cerebral white matter. Moderate generalized cerebral atrophy. Vascular: Atherosclerotic calcification. Skull: No acute or aggressive finding. Known C1 ring and base of dens fractures. Sinuses/Orbits: Bilateral cataract resection. IMPRESSION: 1. Stable head CT.  No detectable acute infarct. 2. Moderate senescent changes. Electronically Signed   By: Monte Fantasia M.D.   On: 08/20/2018 08:32     Transthoracic Echocardiogram  1. The left ventricle has normal systolic function with an ejection fraction of 60-65%. The cavity size was normal. There is mild concentric left ventricular hypertrophy. Left ventricular diastolic Doppler parameters are consistent with pseudonormalization. Elevated mean left atrial pressure No evidence of left ventricular regional wall motion abnormalities. 2. The right ventricle has normal systolic function. The cavity was normal. There is no increase in right ventricular wall thickness. Right ventricular systolic pressure is normal with an estimated pressure of 26.2 mmHg. 3. Left atrial size was mildly dilated. 4. Tricuspid valve regurgitation is mild-moderate. 5. The aortic valve is tricuspid. Mild thickening of the aortic valve. Mild calcification of the aortic valve.  PHYSICAL EXAM Blood pressure (!) 118/57, pulse 75, temperature 98.2 F (36.8 C), temperature source Oral, resp. rate 16, height 5\' 5"  (1.651 m), weight 57.6 kg, SpO2 100 %.  Constitutional: Frail elderly Caucasian lady not in distress psych: Affect appropriate to situation Eyes: No scleral injection HENT: No OP obstrucion Head: Normocephalic.  Cardiovascular: Normal rate and regular rhythm.  Respiratory: Effort normal, non-labored breathing GI: Soft.  No distension. There is no tenderness.  Skin: WDI  Neuro: Mental Status: Patient is awake, alert, oriented to person, place, month, year, and situation. Patient is able to give a clear and coherent history. No signs of aphasia or neglect Speech  mostly normal with occasional stuttering but with distractibility reverts back to being normal.   Cranial Nerves: II: Visual Fields are full. PERRL.  III,IV, VI: EOMI without ptosis or diplopia.  V: Facial sensation is symmetric to temperature VII: Facial movement is symmetric.  VIII: hearing is intact to voice X: Palat elevates symmetrically XI: Shoulder shrug is symmetric. XII: tongue is midline without atrophy or fasciculations.  Motor: Tone is normal. Bulk is normal. 5/5 strength was present in all four extremities.  Sensory: Sensation is symmetric to light touch, temperature sensation decreased from feet up to mid-calf bilaterally. Deep Tendon Reflexes: 2+ and symmetric in the biceps and patellae.  Plantars: Toes are downgoing bilaterally.  Cerebellar: FNF dysmetric bilaterally with left greater than right. HKS intact bilaterally  HOME MEDICATIONS:  Medications Prior to Admission  Medication Sig Dispense Refill  . acetaminophen (TYLENOL) 500 MG tablet Take 500 mg by mouth every 6 (six) hours as needed for moderate pain.     Marland Kitchen albuterol (PROVENTIL HFA;VENTOLIN HFA) 108 (90 Base) MCG/ACT inhaler Inhale 2 puffs into the lungs every 6 (six) hours as needed for wheezing or shortness of breath. 1 Inhaler 1  . amiodarone (PACERONE) 200 MG tablet Take 1 tablet (200 mg total) by mouth every morning. 90 tablet 3  . apixaban (ELIQUIS) 2.5 MG TABS tablet Take 1 tablet (2.5 mg total) by mouth 2 (two) times daily.    Marland Kitchen atorvastatin (LIPITOR) 20 MG tablet Take 1 tablet (20 mg total) by mouth daily for 30 days. 30 tablet 0  . budesonide-formoterol (SYMBICORT) 160-4.5 MCG/ACT inhaler Inhale 2 puffs into the lungs 2 (two) times daily. 1 Inhaler 5  . calcitonin, salmon, (MIACALCIN/FORTICAL) 200 UNIT/ACT nasal spray Place 1 spray into the nose daily.    Marland Kitchen docusate sodium (COLACE) 100 MG capsule Take 1 capsule (100 mg total) by mouth daily. 30 capsule 0  . ferrous sulfate 325 (65 FE) MG tablet Take 1  tablet (325 mg total) by mouth 2 (two) times daily with a meal. 60 tablet 0  . flintstones complete (FLINTSTONES) 60 MG chewable tablet Chew 1 tablet by mouth daily.    Marland Kitchen gabapentin (NEURONTIN) 300 MG capsule Take 1-2 capsules (300-600 mg total) by mouth See admin instructions. Take 1 capsule (300 mg) by mouth in the mornning & take 2 capsules (600 mg) by mouth at night 90 capsule 0  . levothyroxine (SYNTHROID, LEVOTHROID) 75 MCG tablet Take 1 tablet (75 mcg total) by mouth daily at 6 (six) AM. 30 tablet 1  . Niacin (VITAMIN B-3 PO) Take 1 tablet by mouth 2 (two) times a day.    . nitroGLYCERIN (NITROSTAT) 0.4 MG SL tablet Place 1 tablet (0.4 mg total) under the tongue every 5 (five) minutes as needed for chest pain. 25 tablet 2  . pantoprazole (PROTONIX) 40 MG tablet Take 1 tablet (40 mg total) by mouth daily. 30 tablet 1  . vitamin B-12 (CYANOCOBALAMIN) 1000 MCG tablet Take 1 tablet (1,000 mcg total) by mouth daily. (Patient not taking:  Reported on 08/19/2018) 30 tablet 0      HOSPITAL MEDICATIONS:  . amiodarone  200 mg Oral q morning - 10a  . apixaban  2.5 mg Oral BID  . atorvastatin  20 mg Oral Daily  . calcitonin (salmon)  1 spray Alternating Nares Daily  . diclofenac sodium  2 g Topical QID  . docusate sodium  100 mg Oral Daily  . feeding supplement (ENSURE ENLIVE)  237 mL Oral BID BM  . ferrous sulfate  325 mg Oral BID WC  . gabapentin  300 mg Oral q morning - 10a  . gabapentin  600 mg Oral QHS  . insulin aspart  0-9 Units Subcutaneous TID WC  . levothyroxine  75 mcg Oral Q0600  . mometasone-formoterol  2 puff Inhalation BID  . multivitamin with minerals  1 tablet Oral Daily  . pantoprazole  40 mg Oral Daily  . tamsulosin  0.4 mg Oral Daily  . vitamin B-12  1,000 mcg Oral Daily    ALLERGIES Allergies  Allergen Reactions  . Valium [Diazepam] Anxiety    Makes patient hyper  . Robaxin [Methocarbamol] Other (See Comments)    Makes pt loopy/acts drunk  . Tramadol Other (See  Comments)    sedation  . Codeine Hives and Itching    Can take with Benadryl   . Darifenacin Itching    Can take with Benadryl  . Darvon [Propoxyphene Hcl] Itching    Can take with Benadryl  . Daypro [Oxaprozin] Itching    Can take with Benadryl  . Enablex [Darifenacin Hydrobromide Er] Itching    Can take with Benadryl  . Oxycodone Itching    Can take with Benadryl   . Propoxyphene Itching    Can take with Benadryl  . Risperdal [Risperidone] Itching    Can take with Benadryl  . Talwin [Pentazocine] Itching    Can take with Benadryl  . Vicodin [Hydrocodone-Acetaminophen] Itching    Can take with Benadryl   ASSESSMENT/PLAN [name] is a 83 y.o. female presenting with dizziness and generalized weakness, abnormal speech. She did not receive IV t-PA due to outside of time window.   TIA possible; unable to do MR to r/o small stroke  Resultant  Dizziness, slurred speech, gen weakness  CT head- ischemic small vessel dz  MRI head - Unable d/t Pacer  CTA H&N - RCEA is patent; L ICA has ~50% atheromatous stenosis noted  2D Echo - mild LVH, EF 60%  Sars Corona Virus 2 neg  LDL - 44  HgbA1c - 5.5  UDS - n/a  VTE prophylaxis - on Abixipan BID for anticoagulation already  Diet per SLP  Apixaban 2.5mg  BID prior to admission, now on Abixaban 2.5mg  BID  Patient counseled to be compliant with her antithrombotic medications  Ongoing aggressive stroke risk factor management  Therapy recommendations:  SNF  Disposition:  Pending  Hypertension  Has actually been hypotensive .  Permissive hypertension (OK if < 220/120) but gradually normalize in 5-7 days .  Long-term BP goal normotensive  Hyperlipidemia  Lipid lowering medication PTA:  Lipitor 20  LDL 44, goal < 70  Current lipid lowering medication:Lipitor 20mg , no reason to increase home dose d/t age and under goal  Continue statin at discharge  Diabetes  HgbA1c 5.5, goal < 7.0  Controlled  Other  Stroke Risk Factors  Advanced age  Hx stroke/TIA  Prev tobacco smoker  Coronary artery disease  Other Active Problems  Neck pain r/t old cervical fx  Afib- low dose of Abixiban (2.5mg  BID is for age over 76 + <60kg + Cr >1.5.) She has 2 of the 3; will need ongoing out pt monitoring of above criteria.  Hospital day # 2     I   She presented with nonspecific symptoms of dizziness and generalized weakness and admitted for possible stroke.  Initial CT scan of the head x 2 is unremarkable and MRI cannot be done due to pacemaker.    Continue Eliquis and the current dosage.  Aggressive risk factor modification.  Discussed with Dr. Ree Kida.  Stroke team will sign off.  Kindly call for questions.  Antony Contras, MD Medical Director [place] Pager: [phone] 08/20/2018 5:53 PM  To contact Stroke Continuity provider, please refer to [email or web address] After hours, contact General Neurology

## 2018-08-20 NOTE — Progress Notes (Signed)
PROGRESS NOTE    Meredith Gonzalez  VOJ:[id number] DOB: [birth year] DOA: 08/17/2018 PCP: Sinclair Ship, MD   Brief Narrative:  HPI On 08/17/2018 by Dr. Jani Gravel Meredith Gonzalez  is a 83 y.o. female,w CAD PCI/ stent, Pafib on apixaban, h/o pacer 9937, CHF (diastolic),   h/o CVA s/p R CEA (06/03/18), posterior circulation TIA 07/09/18,  Dm2 with retinopathy, Hypothyroidism apparently c/o difficulty walking secondary to bilateral leg weakness starting last nite along with slurred speech.  Pt states that her difficulty walking was worse this morning and therefore presented [facility] ED.  Pt denies headache, seizure, vision change, cp, palp, sob, n/v, abd pain, diarrhea, brbpr, dysuria, hematuria.    Interim history Admitted with neurological deficits including slurred speech and generalized weakness.  CT head unremarkable for acute malady.  Patient was also noted to have hypotension.  Neurology consulted and appreciated. Assessment & Plan   Neurological deficits including slurred speech and lower extremity weakness -TIA versus stroke rule out -CT head stable, without acute intracranial findings. -CTA head and neck: Stable CTA head and neck.  No large vessel occlusion.  Approximate 50% atheromatous stenosis at the origin of the left ICA.  Patent right carotid endarterectomy. -No MRI due to pacemaker. -hemoglobin A1c 5.5, LDL 44 -Echocardiogram: EF 60 to 65%.  Mild concentric left ventricular hypertrophy.  LV diastolic Doppler parameters consistent with pseudonormalization. -Neurology consulted and appreciated-recommended repeat CT scan in 48 to 72 hours -Repeat CT head today: stable . No detectable acute infarct -PT, OT recommending SNF  Hypotension, dehydration -Patient required IV fluids -TSH 8.890 -AM Cortisol level 14.1  Paroxysmal atrial fibrillation -Continue amiodarone, Eliquis  History of CVA, posterior circulation -Continue Eliquis  Diabetes mellitus, type II -Continue insulin sliding scale  with CBG monitoring  Hypothyroidism -Continue Synthroid -TSH 8.890 -FT4 1.2 -patient will need to have repeat thyroid tests in 4-6 weeks  Asthma -Stable, continue albuterol as needed  Urinary retention -Placed on Flomax -UA unremarkable for infection  Hypokalemia -resolved with replacement  Headaches, neck pain -continue Tylenol and Voltaren gel as needed -Requested cervical soft collar- counseled patient on the use of this- may decrease ROM   DVT Prophylaxis  Eliquis  Code Status: DNR  Family Communication: None at bedside  Disposition Plan: Admitted. Pending SNF placement- likely on 5/29  Consultants Neurology  Procedures  Echocardiogram  Antibiotics   Anti-infectives (From admission, onward)   None      Subjective:   Meredith Gonzalez seen and examined today.  Feeling mildly better today.  Continues to have neck pain.  Feels Voltaren gel does help.  Currently denies chest pain, shortness breath, abdominal pain, nausea or vomiting, diarrhea or constipation, dizziness.  Objective:   Vitals:   08/20/18 0020 08/20/18 0357 08/20/18 0756 08/20/18 0917  BP: (!) 160/73 (!) 142/65 (!) 152/71   Pulse: 70 76 70   Resp: 14 14 16    Temp: 98 F (36.7 C) 97.7 F (36.5 C) 97.8 F (36.6 C)   TempSrc: Oral Oral Oral   SpO2: 95% 97% 100% 98%  Weight:      Height:        Intake/Output Summary (Last 24 hours) at 08/20/2018 1206 Last data filed at 08/20/2018 0500 Gross per 24 hour  Intake 1824.7 ml  Output -  Net 1824.7 ml   Filed Weights   08/17/18 1418 08/17/18 2000  Weight: 51.3 kg 57.6 kg   Exam  General: Well developed, well nourished, NAD, appears stated age  82: NCAT, mucous membranes  moist.   Cardiovascular: S1 S2 auscultated, RRR  Respiratory: Clear to auscultation bilaterally   Abdomen: Soft, nontender, nondistended, + bowel sounds  Extremities: warm dry without cyanosis clubbing or edema  Neuro: AAOx3, nonfocal  Psych: pleasant,  appropriate mood and affect  Data Reviewed: I have personally reviewed following labs and imaging studies  CBC: Recent Labs  Lab 08/17/18 1507 08/18/18 0334 08/19/18 0359  WBC 7.6 6.3 6.0  NEUTROABS 5.7  --   --   HGB 13.5 11.7* 11.1*  HCT 43.5 37.5 36.2  MCV 106.1* 106.8* 107.1*  PLT 194 172 476   Basic Metabolic Panel: Recent Labs  Lab 08/17/18 1507 08/18/18 0334 08/19/18 0359  NA 142 141 143  K 3.0* 3.3* 4.1  CL 104 110 115*  CO2 26 24 22   GLUCOSE 106* 152* 101*  BUN 31* 24* 19  CREATININE 1.00 1.00 0.75  CALCIUM 8.4* 8.0* 8.0*   GFR: Estimated Creatinine Clearance: 49.6 mL/min (by C-G formula based on SCr of 0.75 mg/dL). Liver Function Tests: Recent Labs  Lab 08/17/18 1507 08/18/18 0334  AST 20 19  ALT 17 14  ALKPHOS 98 83  BILITOT 0.6 0.5  PROT 7.1 5.6*  ALBUMIN 3.6 2.7*   Recent Labs  Lab 08/17/18 1507  LIPASE 31   No results for input(s): AMMONIA in the last 168 hours. Coagulation Profile: Recent Labs  Lab 08/17/18 1555  INR 1.3*   Cardiac Enzymes: Recent Labs  Lab 08/17/18 1507  TROPONINI <0.03   BNP (last 3 results) No results for input(s): PROBNP in the last 8760 hours. HbA1C: Recent Labs    08/18/18 0334  HGBA1C 5.5   CBG: Recent Labs  Lab 08/19/18 0743 08/19/18 1157 08/19/18 1556 08/19/18 2119 08/20/18 0626  GLUCAP 81 93 117* 126* 71   Lipid Profile: Recent Labs    08/18/18 0334  CHOL 100  HDL 48  LDLCALC 44  TRIG 38  CHOLHDL 2.1   Thyroid Function Tests: Recent Labs    08/19/18 0359 08/19/18 1407  TSH 8.890*  --   FREET4  --  1.20   Anemia Panel: No results for input(s): VITAMINB12, FOLATE, FERRITIN, TIBC, IRON, RETICCTPCT in the last 72 hours. Urine analysis:    Component Value Date/Time   COLORURINE YELLOW 08/18/2018 [place] 08/18/2018 0657   LABSPEC >1.046 (H) 08/18/2018 0657   PHURINE 6.0 08/18/2018 0657   GLUCOSEU NEGATIVE 08/18/2018 0657   HGBUR NEGATIVE 08/18/2018 0657    BILIRUBINUR NEGATIVE 08/18/2018 [place] 08/18/2018 0657   PROTEINUR NEGATIVE 08/18/2018 0657   NITRITE NEGATIVE 08/18/2018 [place] 08/18/2018 0657   Sepsis Labs: @LABRCNTIP (procalcitonin:4,lacticidven:4)  ) Recent Results (from the past 240 hour(s))  SARS Coronavirus 2 (Hosp order,Performed in [place] lab via [employer] [facility])     Status: None   Collection Time: 08/17/18  3:07 PM  Result Value Ref Range Status   SARS Coronavirus 2 ([employer] Now) NEGATIVE NEGATIVE Final    Comment: (NOTE) Interpretive Result Comment(s): COVID 19 Positive SARS CoV 2 target nucleic acids are DETECTED. The SARS CoV 2 RNA is generally detectable in upper and lower respiratory specimens during the acute phase of infection.  Positive results are indicative of active infection with SARS CoV 2.  Clinical correlation with patient history and other diagnostic information is necessary to determine patient infection status.  Positive results do not rule out bacterial infection or coinfection with other viruses. The expected result is Negative. COVID  19 Negative SARS CoV 2 target nucleic acids are NOT DETECTED. The SARS CoV 2 RNA is generally detectable in upper and lower respiratory specimens during the acute phase of infection.  Negative results do not preclude SARS CoV 2 infection, do not rule out coinfections with other pathogens, and should not be used as the sole basis for treatment or other patient management decisions.  Negative results must be combined with clinical  observations, patient history, and epidemiological information. The expected result is Negative. Invalid Presence or absence of SARS CoV 2 nucleic acids cannot be determined. Repeat testing was performed on the submitted specimen and repeated Invalid results were obtained.  If clinically indicated, additional testing on a new specimen with an alternate test methodology [phone]) is advised.   The SARS CoV 2 RNA is generally detectable in upper and lower respiratory specimens during the acute phase of infection. The expected result is Negative. Fact Sheet for Patients:  GolfingFamily.no Fact Sheet for Healthcare Providers: https://www.hernandez-brewer.com/ This test is not yet approved or cleared by the [place] FDA and has been authorized for detection and/or diagnosis of SARS CoV 2 by FDA under an Emergency Use Authorization (EUA).  This EUA will remain in effect (meaning this test can be used) for the duration of the COVID19 d eclaration under Section 564(b)(1) of the Act, 21 U.S.C. section 561 589 9939 3(b)(1), unless the authorization is terminated or revoked sooner. Performed at [facility], [address] [place], [place] [address]       Radiology Studies: Ct Head Wo Contrast  Result Date: 08/20/2018 CLINICAL DATA:  Stroke follow-up EXAM: CT HEAD WITHOUT CONTRAST TECHNIQUE: Contiguous axial images were obtained from the base of the skull through the vertex without intravenous contrast. COMPARISON:  Three days ago FINDINGS: Brain: No evidence of acute infarction, hemorrhage, hydrocephalus, extra-axial collection or mass lesion/mass effect. Chronic small vessel ischemia with confluent low-density in the cerebral white matter. Moderate generalized cerebral atrophy. Vascular: Atherosclerotic calcification. Skull: No acute or aggressive finding. Known C1 ring and base of dens fractures. Sinuses/Orbits: Bilateral cataract resection. IMPRESSION: 1. Stable head CT.  No detectable acute infarct. 2. Moderate senescent changes. Electronically Signed   By: Monte Fantasia M.D.   On: 08/20/2018 08:32     Scheduled Meds: . amiodarone  200 mg Oral q morning - 10a  . apixaban  2.5 mg Oral BID  . atorvastatin  20 mg Oral Daily  . calcitonin (salmon)  1 spray Alternating Nares Daily  . diclofenac sodium  2 g Topical QID  . docusate sodium   100 mg Oral Daily  . feeding supplement (ENSURE ENLIVE)  237 mL Oral BID BM  . ferrous sulfate  325 mg Oral BID WC  . gabapentin  300 mg Oral q morning - 10a  . gabapentin  600 mg Oral QHS  . insulin aspart  0-9 Units Subcutaneous TID WC  . levothyroxine  75 mcg Oral Q0600  . mometasone-formoterol  2 puff Inhalation BID  . multivitamin with minerals  1 tablet Oral Daily  . pantoprazole  40 mg Oral Daily  . tamsulosin  0.4 mg Oral Daily  . vitamin B-12  1,000 mcg Oral Daily   Continuous Infusions: . sodium chloride 75 mL/hr at 08/20/18 0553     LOS: 2 days   Time Spent in minutes   30 minutes  Denaly Gatling D.O. on 08/20/2018 at 12:06 PM  Between 7am to 7pm - Please see pager noted on [email or web address]  After  7pm go to [email or web address]  And look for the night coverage person covering for me after hours  [facility] Office  [phone]

## 2018-08-20 NOTE — Progress Notes (Signed)
Physical Therapy Treatment Patient Details Name: Meredith Gonzalez MRN: [id number] DOB: [birth year] Today's Date: 08/20/2018    History of Present Illness Pt is a 83 y/o female presenting with c/o difficulty walking from B LE weakness and slurred speech. PMH: CAD, a fib, pacemaker 2012, CHF, CVA s/p R CEA (06/03/18), TIA, DM2 with retinopathy, HTN, fibromyalgia.     PT Comments    Pt progressing well towards physical therapy goals. She reports improvement in pain/discomfort with soft collar and lumbar corset this session. Pt was able to state that she was only to wear collar during therapy sessions unless otherwise directed by her MD. SNF remains appropriate to maximize functional independence and return to PLOF.     Follow Up Recommendations  SNF;Supervision/Assistance - 24 hour     Equipment Recommendations  None recommended by PT    Recommendations for Other Services       Precautions / Restrictions Precautions Precautions: Fall;Back Precaution Booklet Issued: No(For comfort) Precaution Comments: many falls with Sept neck fracture, April 2020 hurt left hip with indeterminate age of L-spine fractures Required Braces or Orthoses: Spinal Brace;Cervical Brace Cervical Brace: Soft collar(PT ordered one for pt to wear during sessions for comfort) Spinal Brace: Lumbar corset(Appears for comfort only?? Questionable) Restrictions Weight Bearing Restrictions: No    Mobility  Bed Mobility               General bed mobility comments: Pt sitting up in recliner upon PT arrival.   Transfers Overall transfer level: Needs assistance Equipment used: Rolling walker (2 wheeled) Transfers: Sit to/from Stand Sit to Stand: Min guard         General transfer comment: Pt was able to power-up to full stand without assistance, however close guard provided for safety. Pt able to statically stand without UE support while lumbar corset was applied (refused application in sitting).    Ambulation/Gait Ambulation/Gait assistance: Min guard Gait Distance (Feet): 300 Feet Assistive device: Rolling walker (2 wheeled) Gait Pattern/deviations: Step-through pattern;Decreased stride length;Trunk flexed Gait velocity: Decreased Gait velocity interpretation: 1.31 - 2.62 ft/sec, indicative of limited community ambulator General Gait Details: Pt ambulating well taking a few standing rest breaks 2 UE muscular fatigue from holding walker (pt reports this is an issue for her at baseline and has difficulty even holding a book without arms propped on a pillow).    Stairs             Wheelchair Mobility    Modified Rankin (Stroke Patients Only)       Balance Overall balance assessment: Needs assistance Sitting-balance support: Single extremity supported;Feet supported Sitting balance-Leahy Scale: Fair Sitting balance - Comments: min guard for safety, preference to UE support   Standing balance support: Bilateral upper extremity supported;During functional activity Standing balance-Leahy Scale: Poor Standing balance comment: relaint on support                            Cognition Arousal/Alertness: Awake/alert Behavior During Therapy: WFL for tasks assessed/performed Overall Cognitive Status: Within Functional Limits for tasks assessed                                 General Comments: appears [facility]       Exercises      General Comments        Pertinent Vitals/Pain Pain Assessment: No/denies pain Pain Location: Denies pain during ambulation with cervical  collar and lumbar corset applied.  Pain Intervention(s): Monitored during session    Home Living                      Prior Function            PT Goals (current goals can now be found in the care plan section) Acute Rehab PT Goals Patient Stated Goal: to get to rehab PT Goal Formulation: With patient Time For Goal Achievement: 09/01/18 Potential to Achieve Goals:  Good Progress towards PT goals: Progressing toward goals    Frequency    Min 3X/week      PT Plan Current plan remains appropriate    Co-evaluation              AM-PAC PT "6 Clicks" Mobility   Outcome Measure  Help needed turning from your back to your side while in a flat bed without using bedrails?: None Help needed moving from lying on your back to sitting on the side of a flat bed without using bedrails?: A Little Help needed moving to and from a bed to a chair (including a wheelchair)?: A Little Help needed standing up from a chair using your arms (e.g., wheelchair or bedside chair)?: A Little Help needed to walk in hospital room?: A Lot Help needed climbing 3-5 steps with a railing? : Total 6 Click Score: 16    End of Session Equipment Utilized During Treatment: Gait belt Activity Tolerance: Patient limited by pain;Patient limited by fatigue Patient left: in chair;with call bell/phone within reach;with chair alarm set;with nursing/sitter in room Nurse Communication: Mobility status PT Visit Diagnosis: Unsteadiness on feet (R26.81);Pain Pain - Right/Left: Left Pain - part of body: Hip(and back)     Time: 5929-2446 PT Time Calculation (min) (ACUTE ONLY): 24 min  Charges:  $Gait Training: 23-37 mins                     Meredith Gonzalez, PT, DPT Acute Rehabilitation Services Pager: [phone] Office: [phone]    Meredith Gonzalez 08/20/2018, 1:27 PM

## 2018-08-20 NOTE — Progress Notes (Signed)
Occupational Therapy Treatment Patient Details Name: Meredith Gonzalez MRN: [id number] DOB: 1935-12-29 Today's Date: 08/20/2018    History of present illness Pt is a 83 y/o female presenting with c/o difficulty walking from B LE weakness and slurred speech. PMH: CAD, a fib, pacemaker 2012, CHF, CVA s/p R CEA (06/03/18), TIA, DM2 with retinopathy, HTN, fibromyalgia.    OT comments  Patient seated in recliner upon therapy arrival and agreeable to participate in OT treatment session. Pt completed BUE shoulder and elbow strengthening utilizing green theraband to increase functional performance during daily tasks and functional transfers. Patient was provided with HEP to complete with green band independently and verbalized and demonstrated understanding. Patient continues to progress towards therapy goals. Will continue to follow pt acutely.   Follow Up Recommendations  SNF;Supervision/Assistance - 24 hour          Precautions / Restrictions Precautions Precautions: Fall Precaution Booklet Issued: No(For comfort) Precaution Comments: Patient has a soft cervical collar and back brace for comfort only. No active precautions related to back and neck. Required Braces or Orthoses: Spinal Brace;Cervical Brace Cervical Brace: Soft collar(PT ordered one for pt to wear during sessions for comfort) Spinal Brace: Lumbar corset(Appears for comfort only?? Questionable) Restrictions Weight Bearing Restrictions: No              ADL either performed or assessed with clinical judgement        Vision Baseline Vision/History: Wears glasses Wears Glasses: Reading only Patient Visual Report: No change from baseline            Cognition Arousal/Alertness: Awake/alert Behavior During Therapy: WFL for tasks assessed/performed Overall Cognitive Status: Within Functional Limits for tasks assessed  General Comments: appears [facility]         Exercises Shoulder Exercises Shoulder Flexion:  Strengthening;Theraband;Seated;Both;10 reps Theraband Level (Shoulder Flexion): Level 3 (Green) Shoulder ABduction: Strengthening;Both;10 reps;Theraband;Seated Shoulder External Rotation: Strengthening;Both;10 reps;Theraband;Seated Theraband Level (Shoulder External Rotation): Level 3 (Green) Elbow Flexion: Strengthening;Both;10 reps;Theraband;Seated Theraband Level (Elbow Flexion): Level 3 (Green) Elbow Extension: Strengthening;Both;10 reps;Theraband;Seated Theraband Level (Elbow Extension): Level 3 (Green) Other Exercises Other Exercises: shoulder protraction, horizontal adduction; BUE; green band; 10X           Pertinent Vitals/ Pain       Pain Assessment: No/denies pain Pain Location: Denies pain during ambulation with cervical collar and lumbar corset applied.  Pain Intervention(s): Monitored during session      Progress Toward Goals  OT Goals(current goals can now be found in the care plan section)  Progress towards OT goals: Progressing toward goals  Acute Rehab OT Goals Patient Stated Goal: to get to rehab  Plan Discharge plan remains appropriate;Frequency remains appropriate       AM-PAC OT "6 Clicks" Daily Activity     Outcome Measure   Help from another person eating meals?: None Help from another person taking care of personal grooming?: None Help from another person toileting, which includes using toliet, bedpan, or urinal?: A Little Help from another person bathing (including washing, rinsing, drying)?: A Little Help from another person to put on and taking off regular upper body clothing?: A Little Help from another person to put on and taking off regular lower body clothing?: A Little 6 Click Score: 20    End of Session    OT Visit Diagnosis: Unsteadiness on feet (R26.81);Other abnormalities of gait and mobility (R26.89);Muscle weakness (generalized) (M62.81)   Activity Tolerance Patient tolerated treatment well   Patient Left in chair;with call  bell/phone within reach;with  chair alarm set           Time: 1533-1600 OT Time Calculation (min): 27 min  Charges: OT General Charges $OT Visit: 1 Visit OT Treatments $Therapeutic Exercise: 23-37 mins(27')  {Spiros Greenfeld, OTR/L,CBIS  [phone]    Kyion Gautier, Clarene Duke 08/20/2018, 4:07 PM

## 2018-08-21 ENCOUNTER — Other Ambulatory Visit: Payer: Self-pay | Admitting: Physical Medicine and Rehabilitation

## 2018-08-21 LAB — GLUCOSE, CAPILLARY
Glucose-Capillary: 79 mg/dL (ref 70–99)
Glucose-Capillary: 83 mg/dL (ref 70–99)
Glucose-Capillary: 86 mg/dL (ref 70–99)
Glucose-Capillary: 181 mg/dL — ABNORMAL HIGH (ref 70–99)

## 2018-08-21 MED ORDER — ENSURE ENLIVE PO LIQD: 237.0000 mL | Freq: Two times a day (BID) | ORAL | Status: DC

## 2018-08-21 MED ORDER — DICLOFENAC SODIUM 1 % TD GEL: 2.0000 g | Freq: Four times a day (QID) | TRANSDERMAL | 0 refills | Status: DC

## 2018-08-21 MED ORDER — TAMSULOSIN HCL 0.4 MG PO CAPS: 0.4000 mg | ORAL_CAPSULE | Freq: Every day | ORAL | Status: DC

## 2018-08-21 MED ORDER — LORAZEPAM 0.5 MG PO TABS
0.2500 mg | ORAL_TABLET | Freq: Once | ORAL | Status: AC
  Administered 2018-08-21: 13:00:00 0.25 mg via ORAL
  Filled 2018-08-21: qty 1

## 2018-08-21 NOTE — Progress Notes (Signed)
Physical Therapy Treatment Patient Details Name: Meredith Gonzalez MRN: [id number] DOB: July 13, 1935 Today's Date: 08/21/2018    History of Present Illness Pt is a 83 y/o female presenting with c/o difficulty walking from B LE weakness and slurred speech. PMH: CAD, a fib, pacemaker 2012, CHF, CVA s/p R CEA (06/03/18), TIA, DM2 with retinopathy, HTN, fibromyalgia.     PT Comments    Pt progressing towards physical therapy goals. Was able to ambulate this session without difficulty with lumbar corset donned. Pt declined cervical collar, stating it feels like it pushes her neck forward and she feels she can't stand up straight with it on. Pt required light supervision for transition into bathroom to void, peri-care, washing hands at sink, and brushing teeth. Pt was cued for maintenance of precautions for comfort during ADL's. Will continue to follow.    Follow Up Recommendations  SNF;Supervision/Assistance - 24 hour     Equipment Recommendations  None recommended by PT    Recommendations for Other Services       Precautions / Restrictions Precautions Precautions: Fall Precaution Booklet Issued: No(For comfort) Precaution Comments: Patient has a soft cervical collar and back brace for comfort only. No active precautions related to back and neck. Required Braces or Orthoses: Spinal Brace;Cervical Brace Cervical Brace: Soft collar(PT ordered one for pt to wear during sessions for comfort) Spinal Brace: Lumbar corset(Appears for comfort only?? Questionable) Restrictions Weight Bearing Restrictions: No    Mobility  Bed Mobility Overal bed mobility: Needs Assistance Bed Mobility: Supine to Sit;Sit to Sidelying;Rolling Rolling: Supervision   Supine to sit: Min guard;HOB elevated   Sit to sidelying: Min assist General bed mobility comments: Pt sitting up in recliner upon PT arrival.   Transfers Overall transfer level: Needs assistance Equipment used: Rolling walker (2  wheeled) Transfers: Sit to/from Stand Sit to Stand: Min guard Stand pivot transfers: Mod assist;Max assist       General transfer comment: Pt was able to power-up to full stand without assistance, however close guard provided for safety. Pt able to statically stand without UE support while lumbar corset was applied (refused application in sitting).   Ambulation/Gait Ambulation/Gait assistance: Min guard   Assistive device: Rolling walker (2 wheeled) Gait Pattern/deviations: Step-through pattern;Decreased stride length;Trunk flexed Gait velocity: Decreased Gait velocity interpretation: 1.31 - 2.62 ft/sec, indicative of limited community ambulator General Gait Details: Pt ambulating well taking a few standing rest breaks 2 UE muscular fatigue from holding walker (pt reports this is an issue for her at baseline and has difficulty even holding a book without arms propped on a pillow).    Stairs             Wheelchair Mobility    Modified Rankin (Stroke Patients Only)       Balance Overall balance assessment: Needs assistance Sitting-balance support: Single extremity supported;Feet supported Sitting balance-Leahy Scale: Fair Sitting balance - Comments: min guard for safety, preference to UE support   Standing balance support: Bilateral upper extremity supported;During functional activity Standing balance-Leahy Scale: Poor Standing balance comment: relaint on support                            Cognition Arousal/Alertness: Awake/alert Behavior During Therapy: WFL for tasks assessed/performed Overall Cognitive Status: Within Functional Limits for tasks assessed  General Comments: appears [facility]       Exercises      General Comments        Pertinent Vitals/Pain Pain Assessment: No/denies pain Pain Location: Pt declines cervical collar and reports no pain during ambulation Pain Intervention(s): Monitored  during session    Home Living                      Prior Function            PT Goals (current goals can now be found in the care plan section) Acute Rehab PT Goals Patient Stated Goal: to get to rehab PT Goal Formulation: With patient Time For Goal Achievement: 09/01/18 Potential to Achieve Goals: Good Progress towards PT goals: Progressing toward goals    Frequency    Min 3X/week      PT Plan Current plan remains appropriate    Co-evaluation              AM-PAC PT "6 Clicks" Mobility   Outcome Measure  Help needed turning from your back to your side while in a flat bed without using bedrails?: None Help needed moving from lying on your back to sitting on the side of a flat bed without using bedrails?: A Little Help needed moving to and from a bed to a chair (including a wheelchair)?: A Little Help needed standing up from a chair using your arms (e.g., wheelchair or bedside chair)?: A Little Help needed to walk in hospital room?: A Lot Help needed climbing 3-5 steps with a railing? : Total 6 Click Score: 16    End of Session Equipment Utilized During Treatment: Gait belt Activity Tolerance: Patient limited by pain;Patient limited by fatigue Patient left: in chair;with call bell/phone within reach;with chair alarm set;with nursing/sitter in room Nurse Communication: Mobility status PT Visit Diagnosis: Unsteadiness on feet (R26.81);Pain Pain - Right/Left: Left Pain - part of body: Hip(and back)     Time: 1010-1031 PT Time Calculation (min) (ACUTE ONLY): 21 min  Charges:  $Gait Training: 8-22 mins                     [name], PT, DPT Acute Rehabilitation Services Pager: [phone] Office: [phone]    [name] 08/21/2018, 11:13 AM

## 2018-08-21 NOTE — TOC Progression Note (Signed)
Transition of Care [facility]) - Progression Note    Patient Details  Name: Kerianne Gurr MRN: [id number] Date of Birth: 27-Feb-1936  Transition of Care [facility]) CM/SW [place], [place] Phone Number: 08/21/2018, 5:31 PM  Clinical Narrative:   CSW following for discharge. CSW alerted by [facility] that they no longer have a bed for the patient today. CSW alerted patient and patient's son, they will wait until [facility] has a bed available tomorrow for discharge. MD aware.     Expected Discharge Plan: Skilled Nursing Facility Barriers to Discharge: Continued Medical Work up  Expected Discharge Plan and Services Expected Discharge Plan: [place] Choice: [place] arrangements for the past 2 months: [place], [place] Expected Discharge Date: 08/21/18                                     Social Determinants of Health (SDOH) Interventions    Readmission Risk Interventions Readmission Risk Prevention Plan 08/19/2018  Transportation Screening Complete  Medication Review Press photographer) Complete  PCP or Specialist appointment within 3-5 days of discharge Complete  HRI or [place] Complete  SW Recovery Care/Counseling Consult Complete  Palliative Care Screening Not [place] Complete  Some recent data might be hidden

## 2018-08-21 NOTE — Discharge Instructions (Signed)

## 2018-08-21 NOTE — Care Management Important Message (Signed)
Important Message  Patient Details  Name: Meredith Gonzalez MRN: [id number] Date of Birth: 05/26/1935   Medicare Important Message Given:  Yes    Bonifacio Pruden [address] 08/21/2018, 3:23 PM

## 2018-08-22 LAB — GLUCOSE, CAPILLARY
Glucose-Capillary: 80 mg/dL (ref 70–99)
Glucose-Capillary: 168 mg/dL — ABNORMAL HIGH (ref 70–99)

## 2018-08-22 MED ORDER — LORAZEPAM 0.5 MG PO TABS
0.2500 mg | ORAL_TABLET | Freq: Once | ORAL | Status: AC
  Administered 2018-08-22: 10:00:00 0.25 mg via ORAL
  Filled 2018-08-22: qty 1

## 2018-08-22 MED ORDER — LORAZEPAM 0.5 MG PO TABS: 0.2500 mg | ORAL_TABLET | Freq: Four times a day (QID) | ORAL | 0 refills | Status: DC | PRN

## 2018-08-22 NOTE — Progress Notes (Signed)
Report called to [facility].  Arlis Porta

## 2018-08-22 NOTE — Progress Notes (Signed)
Pt D/C from unit, left via stretcher with PTAR. All Lines removed.  Meredith Gonzalez

## 2018-08-22 NOTE — TOC Transition Note (Signed)
Transition of Care [facility]) - CM/SW Discharge Note   Patient Details  Name: Meredith Gonzalez MRN: [id number] Date of Birth: 1935/11/23  Transition of Care [facility]) CM/SW Contact:  Gelene Mink, [place] Phone Number: 08/22/2018, 9:41 AM   Clinical Narrative:     Patient will DC to: [facility] Anticipated DC date: 08/22/2018 Family notified: Yes, pt son Annia Belt by: Corey Harold   Per MD patient ready for DC to . RN, patient, patient's family, and facility notified of DC. Discharge Summary and FL2 sent to facility. RN to call report prior to discharge ([phone]). Patient will go to room 506. DC packet on chart. Ambulance transport requested for patient.   CSW will sign off for now as social work intervention is no longer needed. Please consult [place] again if new needs arise.  Boleslaus Holloway, LCSW-A [place]/Clinical Social Work Department Cell: [phone]    Final next level of care: [place] Barriers to Discharge: No Barriers Identified   Patient Goals and CMS Choice Patient states their goals for this hospitalization and ongoing recovery are:: Pt will go to [facility] to complete rehab CMS Medicare.gov Compare Post Acute Care list provided to:: Patient Choice offered to / list presented to : Patient  Discharge Placement   Existing PASRR number confirmed : 08/18/18          Patient chooses bed at: [place] Patient to be transferred to facility by: [place] Name of family member notified: Lennette Bihari Patient and family notified of of transfer: 08/22/18  Discharge Plan and Services     Post Acute Care Choice: [place]          DME Arranged: N/A DME Agency: NA       HH Arranged: NA HH Agency: NA        Social Determinants of Health (SDOH) Interventions     Readmission Risk Interventions Readmission Risk Prevention Plan 08/19/2018  Transportation Screening Complete  Medication Review Press photographer) Complete  PCP or Specialist  appointment within 3-5 days of discharge Complete  HRI or Home Care Consult Complete  SW Recovery Care/Counseling Consult Complete  Palliative Care Screening Not Applicable  Skilled Nursing Facility Complete  Some recent data might be hidden

## 2018-08-22 NOTE — Progress Notes (Addendum)
Patient stable for discharged. Discharge summary done on 08/21/2018. Patient with mild anxiety about going to SNF, ativan script given. Discharge summary medication list updated.  Cortlan Dolin D.O. [facility]  If 7PM-7AM, please contact night-coverage [email or web address] 08/22/2018, 7:00 AM

## 2018-09-02 ENCOUNTER — Other Ambulatory Visit: Payer: Self-pay | Admitting: Physical Medicine and Rehabilitation

## 2018-09-17 ENCOUNTER — Ambulatory Visit (INDEPENDENT_AMBULATORY_CARE_PROVIDER_SITE_OTHER): Payer: Medicare Other | Admitting: *Deleted

## 2018-09-17 DIAGNOSIS — R001 Bradycardia, unspecified: Secondary | ICD-10-CM

## 2018-09-17 LAB — CUP PACEART REMOTE DEVICE CHECK
Date Time Interrogation Session: 20200625100447
Implantable Lead Implant Date: 20110523
Implantable Lead Implant Date: 20141010
Implantable Lead Location: 753859
Implantable Lead Location: 753860
Implantable Lead Model: 346
Implantable Lead Model: 350
Implantable Lead Serial Number: 28756637
Implantable Lead Serial Number: 29491855
Implantable Pulse Generator Implant Date: 20110523
Pulse Gen Serial Number: 66080052

## 2018-09-27 ENCOUNTER — Encounter: Payer: Self-pay | Admitting: Cardiology

## 2018-09-27 NOTE — Progress Notes (Signed)
Remote pacemaker transmission.   

## 2018-10-18 ENCOUNTER — Inpatient Hospital Stay (HOSPITAL_BASED_OUTPATIENT_CLINIC_OR_DEPARTMENT_OTHER)
Admission: EM | Admit: 2018-10-18 | Discharge: 2018-10-22 | DRG: 536 | Disposition: A | Discharge status: Home / Self Care | Payer: Medicare Other | Attending: Internal Medicine | Admitting: Internal Medicine

## 2018-10-18 ENCOUNTER — Emergency Department (HOSPITAL_BASED_OUTPATIENT_CLINIC_OR_DEPARTMENT_OTHER): Payer: Medicare Other

## 2018-10-18 ENCOUNTER — Encounter (HOSPITAL_BASED_OUTPATIENT_CLINIC_OR_DEPARTMENT_OTHER): Payer: Self-pay | Admitting: Emergency Medicine

## 2018-10-18 DIAGNOSIS — Y9389 Activity, other specified: Secondary | ICD-10-CM

## 2018-10-18 DIAGNOSIS — D649 Anemia, unspecified: Secondary | ICD-10-CM | POA: Diagnosis present

## 2018-10-18 DIAGNOSIS — R2990 Unspecified symptoms and signs involving the nervous system: Secondary | ICD-10-CM | POA: Diagnosis present

## 2018-10-18 DIAGNOSIS — Z20828 Contact with and (suspected) exposure to other viral communicable diseases: Secondary | ICD-10-CM | POA: Diagnosis present

## 2018-10-18 DIAGNOSIS — F329 Major depressive disorder, single episode, unspecified: Secondary | ICD-10-CM | POA: Diagnosis present

## 2018-10-18 DIAGNOSIS — R4701 Aphasia: Secondary | ICD-10-CM | POA: Diagnosis present

## 2018-10-18 DIAGNOSIS — Z8249 Family history of ischemic heart disease and other diseases of the circulatory system: Secondary | ICD-10-CM | POA: Diagnosis not present

## 2018-10-18 DIAGNOSIS — Z85828 Personal history of other malignant neoplasm of skin: Secondary | ICD-10-CM | POA: Diagnosis not present

## 2018-10-18 DIAGNOSIS — G8929 Other chronic pain: Secondary | ICD-10-CM | POA: Diagnosis present

## 2018-10-18 DIAGNOSIS — S32512A Fracture of superior rim of left pubis, initial encounter for closed fracture: Secondary | ICD-10-CM | POA: Diagnosis not present

## 2018-10-18 DIAGNOSIS — Z66 Do not resuscitate: Secondary | ICD-10-CM | POA: Diagnosis present

## 2018-10-18 DIAGNOSIS — Z79899 Other long term (current) drug therapy: Secondary | ICD-10-CM | POA: Diagnosis not present

## 2018-10-18 DIAGNOSIS — I110 Hypertensive heart disease with heart failure: Secondary | ICD-10-CM | POA: Diagnosis present

## 2018-10-18 DIAGNOSIS — Z8673 Personal history of transient ischemic attack (TIA), and cerebral infarction without residual deficits: Secondary | ICD-10-CM | POA: Diagnosis not present

## 2018-10-18 DIAGNOSIS — X501XXA Overexertion from prolonged static or awkward postures, initial encounter: Secondary | ICD-10-CM

## 2018-10-18 DIAGNOSIS — E1142 Type 2 diabetes mellitus with diabetic polyneuropathy: Secondary | ICD-10-CM | POA: Diagnosis present

## 2018-10-18 DIAGNOSIS — I5032 Chronic diastolic (congestive) heart failure: Secondary | ICD-10-CM | POA: Diagnosis present

## 2018-10-18 DIAGNOSIS — Z87891 Personal history of nicotine dependence: Secondary | ICD-10-CM | POA: Diagnosis not present

## 2018-10-18 DIAGNOSIS — Z7901 Long term (current) use of anticoagulants: Secondary | ICD-10-CM | POA: Diagnosis not present

## 2018-10-18 DIAGNOSIS — Z811 Family history of alcohol abuse and dependence: Secondary | ICD-10-CM

## 2018-10-18 DIAGNOSIS — Z79890 Hormone replacement therapy (postmenopausal): Secondary | ICD-10-CM

## 2018-10-18 DIAGNOSIS — I4891 Unspecified atrial fibrillation: Secondary | ICD-10-CM | POA: Diagnosis present

## 2018-10-18 DIAGNOSIS — F419 Anxiety disorder, unspecified: Secondary | ICD-10-CM | POA: Diagnosis present

## 2018-10-18 DIAGNOSIS — G459 Transient cerebral ischemic attack, unspecified: Secondary | ICD-10-CM | POA: Diagnosis present

## 2018-10-18 DIAGNOSIS — E039 Hypothyroidism, unspecified: Secondary | ICD-10-CM | POA: Diagnosis present

## 2018-10-18 DIAGNOSIS — S32592A Other specified fracture of left pubis, initial encounter for closed fracture: Principal | ICD-10-CM | POA: Diagnosis present

## 2018-10-18 LAB — CBC WITH DIFFERENTIAL/PLATELET
Abs Immature Granulocytes: 0.01 10*3/uL (ref 0.00–0.07)
Basophils Absolute: 0 10*3/uL (ref 0.0–0.1)
Basophils Relative: 0 %
Eosinophils Absolute: 0 10*3/uL (ref 0.0–0.5)
Eosinophils Relative: 1 %
HCT: 35.7 % — ABNORMAL LOW (ref 36.0–46.0)
Hemoglobin: 11.1 g/dL — ABNORMAL LOW (ref 12.0–15.0)
Immature Granulocytes: 0 %
Lymphocytes Relative: 18 %
Lymphs Abs: 1.1 10*3/uL (ref 0.7–4.0)
MCH: 32.9 pg (ref 26.0–34.0)
MCHC: 31.1 g/dL (ref 30.0–36.0)
MCV: 105.9 fL — ABNORMAL HIGH (ref 80.0–100.0)
Monocytes Absolute: 0.6 10*3/uL (ref 0.1–1.0)
Monocytes Relative: 9 %
Neutro Abs: 4.5 10*3/uL (ref 1.7–7.7)
Neutrophils Relative %: 72 %
Platelets: 195 10*3/uL (ref 150–400)
RBC: 3.37 MIL/uL — ABNORMAL LOW (ref 3.87–5.11)
RDW: 13.2 % (ref 11.5–15.5)
WBC: 6.3 10*3/uL (ref 4.0–10.5)
nRBC: 0 % (ref 0.0–0.2)

## 2018-10-18 LAB — URINALYSIS, ROUTINE W REFLEX MICROSCOPIC
Bilirubin Urine: NEGATIVE
Glucose, UA: NEGATIVE mg/dL
Hgb urine dipstick: NEGATIVE
Ketones, ur: NEGATIVE mg/dL
Leukocytes,Ua: NEGATIVE
Nitrite: NEGATIVE
Protein, ur: NEGATIVE mg/dL
Specific Gravity, Urine: 1.025 (ref 1.005–1.030)
pH: 6 (ref 5.0–8.0)

## 2018-10-18 LAB — COMPREHENSIVE METABOLIC PANEL
ALT: 24 U/L (ref 0–44)
AST: 28 U/L (ref 15–41)
Albumin: 3.3 g/dL — ABNORMAL LOW (ref 3.5–5.0)
Alkaline Phosphatase: 94 U/L (ref 38–126)
Anion gap: 7 (ref 5–15)
BUN: 26 mg/dL — ABNORMAL HIGH (ref 8–23)
CO2: 22 mmol/L (ref 22–32)
Calcium: 8.7 mg/dL — ABNORMAL LOW (ref 8.9–10.3)
Chloride: 112 mmol/L — ABNORMAL HIGH (ref 98–111)
Creatinine, Ser: 0.74 mg/dL (ref 0.44–1.00)
GFR calc Af Amer: >60 mL/min (ref >60)
GFR calc non Af Amer: >60 mL/min (ref >60)
Glucose, Bld: 101 mg/dL — ABNORMAL HIGH (ref 70–99)
Potassium: 4.1 mmol/L (ref 3.5–5.1)
Sodium: 141 mmol/L (ref 135–145)
Total Bilirubin: 0.6 mg/dL (ref 0.3–1.2)
Total Protein: 6.6 g/dL (ref 6.5–8.1)

## 2018-10-18 LAB — APTT: aPTT: 40 seconds — ABNORMAL HIGH (ref 24–36)

## 2018-10-18 LAB — PROTIME-INR
INR: 1.2 (ref 0.8–1.2)
Prothrombin Time: 14.8 seconds (ref 11.4–15.2)

## 2018-10-18 LAB — SARS CORONAVIRUS 2 BY RT PCR (HOSPITAL ORDER, PERFORMED IN ~~LOC~~ HOSPITAL LAB): SARS Coronavirus 2: NEGATIVE

## 2018-10-18 MED ORDER — ACETAMINOPHEN 325 MG PO TABS
650.0000 mg | ORAL_TABLET | Freq: Once | ORAL | Status: AC
  Administered 2018-10-18: 650 mg via ORAL
  Filled 2018-10-18: qty 2

## 2018-10-18 NOTE — ED Notes (Signed)
Updated son on pending admission and plan of care; will call son once bed has been assigned; son verbalized understanding.

## 2018-10-18 NOTE — ED Notes (Signed)
Patient's son called and spoke with Dr Lita Mains for update.

## 2018-10-18 NOTE — ED Notes (Signed)
Patient ambulated to doorway with walker and 2 + assist; patient states she felt weak and became difficult to continue to walk; patient then assisted to restroom via wheelchair.

## 2018-10-18 NOTE — ED Triage Notes (Addendum)
Son noticed that her speech was slurred when he spoke on the phone to her today. Yesterday around 1600, speech was clear.  Speech delayed but clear at present

## 2018-10-18 NOTE — ED Notes (Signed)
Pt's son has gone home but wishes to be called when pt's disposition is known

## 2018-10-18 NOTE — Progress Notes (Signed)
Patient is an 83 year old female, lives alone at home, with multiple medical and cardiac history.  Patient presents with left hip/inguinal pain and the x-ray revealed superior left pubic rami fracture.  There is a concern for possible TIA.  MRI brain is not possible due to pacemaker.  Patient be admitted for further assessment and management.

## 2018-10-18 NOTE — ED Notes (Signed)
Called [facility] - advised that bed was ready, Covid was negative

## 2018-10-18 NOTE — ED Notes (Signed)
ED Provider at bedside. Dr. Lita Mains

## 2018-10-18 NOTE — ED Notes (Signed)
Patient transported to CT 

## 2018-10-18 NOTE — ED Provider Notes (Signed)
[facility] EMERGENCY DEPARTMENT Provider Note   CSN: [id number] Arrival date & time: 10/18/18  1734    History   Chief Complaint Chief Complaint  Patient presents with   Altered Mental Status    HPI Meredith Gonzalez is a 83 y.o. female.     HPI Patient brought in by her son.  States he called her around 4 PM today and noted that she was slurring her words.  Patient complained to him at the time that she was having difficulty ambulating due to feeling off balance.  Patient also complains of left groin pain which is been present for several days.  Think she may have twisted wrong getting out of her chair.  Also complains of tremors especially in the right lower extremity..  She was last seen normal sometime yesterday. Past Medical History:  Diagnosis Date   Anemia    Anxiety    Arthritis    "all over" (02/11/2018)   Basal cell carcinoma    "left leg" (02/11/2018)   Cervical spine fracture ([place]) 12/2017   "C1-2"   CHF (congestive heart failure) (HCC)    Chronic bronchitis (HCC)    Chronic neck pain    "since I broke my neck 6-8 wk ago" (02/11/2018)   Diabetic peripheral neuropathy ([place])    Fibromyalgia    Fracture of right humerus    Generalized weakness    Headache    "weekly" (02/11/2018)   History of blood transfusion    "related to one of my femur surgeries" (02/11/2018)   History of echocardiogram    Echo 8/18: EF 50-55, no RWMA, Gr 1 DD, calcified AV leaflets, MAC, trivial MR, mod LAE, PASP 37   Hypertension    Hypothyroidism    Incontinence of urine    Ischemic stroke ([place]) 2000   "lost part of the vision in my right eye" (02/11/2018)   Major depression, chronic    Myocardial infarction ([place]) 06/2016   Pacemaker    Recurrent falls    Syncope and collapse    Type 2 diabetes, diet controlled ([place])     Patient Active Problem List   Diagnosis Date Noted   Dehydration 08/18/2018   Dizziness 07/09/2018   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   Stroke-like symptom    Dyspnea    Acute blood loss anemia    Acute ischemic stroke ([place]) 05/31/2018   Stroke ([place]) 05/30/2018   Chronic diastolic congestive heart failure (HCC)    Anemia of chronic disease    Hypoalbuminemia due to protein-calorie malnutrition ([place])    Labile blood pressure    Atrial fibrillation (HCC)    Chronic neck pain    Subcortical infarction ([place]) 04/27/2018   CVA (cerebral vascular accident) ([place]) 04/26/2018   TIA (transient ischemic attack) 04/24/2018   CAD (coronary artery disease) 08/07/2016   (HFpEF) heart failure with preserved ejection fraction ([place]) 08/07/2016   Ischemic cardiomyopathy 08/07/2016   Chest pain    NSTEMI (non-ST elevated myocardial infarction) [facility])    Status post coronary artery stent placement    Abnormal stress test    SOB (shortness of breath)    Multifocal pneumonia 07/13/2016   Elevated troponin 07/13/2016   Leukocytosis    Pacemaker 77/41/2878   Complications, pacemaker cardiac, mechanical 12/31/2012   Edema extremities 10/08/2012   Persistent atrial fibrillation 10/08/2012   Hypertension 10/08/2012   Hypothyroidism 10/08/2012   Neuropathy 10/08/2012   Diabetes ([place]) 10/08/2012   Depression 09/08/2012  Hypokalemia 08/08/2012   Anemia 08/08/2012   Knee fracture, left 08/07/2012   Recurrent falls 08/07/2012   Syncope 08/07/2012   SDH (subdural hematoma) ([place]) 08/07/2012    Past Surgical History:  Procedure Laterality Date   ABDOMINAL HYSTERECTOMY     ANKLE FRACTURE SURGERY Right    BASAL CELL CARCINOMA EXCISION Left    "leg"   CARDIAC CATHETERIZATION  ~ 2014   CARDIOVERSION N/A 02/13/2018   Procedure: CARDIOVERSION;  Surgeon: Sueanne Margarita, MD;  Location: [facility] ENDOSCOPY;  Service: Cardiovascular;  Laterality: N/A;   CARDIOVERSION N/A 03/27/2018   Procedure: CARDIOVERSION;  Surgeon: Lelon Perla, MD;  Location: [facility]  ENDOSCOPY;  Service: Cardiovascular;  Laterality: N/A;   CARPAL TUNNEL RELEASE     bilateral   CATARACT EXTRACTION W/ INTRAOCULAR LENS  IMPLANT, BILATERAL Bilateral    CHOLECYSTECTOMY OPEN     COLONOSCOPY     CORONARY ANGIOPLASTY WITH STENT PLACEMENT  06/2016   CORONARY STENT INTERVENTION N/A 07/19/2016   Procedure: Coronary Stent Intervention;  Surgeon: Peter M Martinique, MD;  Location: [facility] CV LAB;  Service: Cardiovascular;  Laterality: N/A;   ENDARTERECTOMY Right 06/03/2018   Procedure: ENDARTERECTOMY CAROTID;  Surgeon: Rosetta Posner, MD;  Location: [facility];  Service: Vascular;  Laterality: Right;   EYE SURGERY     right eye catarace/lens implant   FEMUR FRACTURE SURGERY Bilateral    FRACTURE SURGERY     GASTRIC BYPASS     INSERT / REPLACE / REMOVE PACEMAKER  ~ 2012   LEAD REVISION N/A 01/01/2013   Procedure: LEAD REVISION;  Surgeon: Evans Lance, MD;  Location: [facility] CATH LAB;  Service: Cardiovascular;  Laterality: N/A;   LEFT HEART CATH AND CORONARY ANGIOGRAPHY N/A 07/19/2016   Procedure: Left Heart Cath and Coronary Angiography;  Surgeon: Peter M Martinique, MD;  Location: [facility] CV LAB;  Service: Cardiovascular;  Laterality: N/A;   OVARIAN CYST SURGERY     "one side only"   PATCH ANGIOPLASTY Right 06/03/2018   Procedure: PATCH ANGIOPLASTY USING HEMASHIELD PLATINUM FINESSE;  Surgeon: Rosetta Posner, MD;  Location: [facility];  Service: Vascular;  Laterality: Right;   TEE WITHOUT CARDIOVERSION N/A 02/13/2018   Procedure: TRANSESOPHAGEAL ECHOCARDIOGRAM (TEE);  Surgeon: Sueanne Margarita, MD;  Location: [facility] ENDOSCOPY;  Service: Cardiovascular;  Laterality: N/A;   TONSILLECTOMY       OB History   No obstetric history on file.      Home Medications    Prior to Admission medications   Medication Sig Start Date End Date Taking? Authorizing Provider  acetaminophen (TYLENOL) 500 MG tablet Take 500 mg by mouth every 6 (six) hours as needed for moderate pain.    Yes  [provider]  albuterol (PROVENTIL HFA;VENTOLIN HFA) 108 (90 Base) MCG/ACT inhaler Inhale 2 puffs into the lungs every 6 (six) hours as needed for wheezing or shortness of breath. 05/07/18  Yes Angiulli, Lavon Paganini, PA-C  amiodarone (PACERONE) 200 MG tablet Take 1 tablet (200 mg total) by mouth every morning. 05/07/18  Yes Angiulli, Lavon Paganini, PA-C  apixaban (ELIQUIS) 2.5 MG TABS tablet Take 1 tablet (2.5 mg total) by mouth 2 (two) times daily. 07/13/18  Yes Domenic Polite, MD  atorvastatin (LIPITOR) 20 MG tablet Take 1 tablet (20 mg total) by mouth daily for 30 days. 06/05/18  Yes Dessa Phi, DO  budesonide-formoterol (SYMBICORT) 160-4.5 MCG/ACT inhaler Inhale 2 puffs into the lungs 2 (two) times daily. 05/20/18  Yes Marshell Garfinkel, MD  calcitonin,  salmon, (MIACALCIN/FORTICAL) 200 UNIT/ACT nasal spray Place 1 spray into the nose daily. 08/04/18  Yes [provider]  diclofenac sodium (VOLTAREN) 1 % GEL Apply 2 g topically 4 (four) times daily. 08/21/18  Yes Mikhail, Velta Addison, DO  ferrous sulfate 325 (65 FE) MG tablet Take 1 tablet (325 mg total) by mouth 2 (two) times daily with a meal. 05/07/18  Yes Angiulli, Lavon Paganini, PA-C  flintstones complete (FLINTSTONES) 60 MG chewable tablet Chew 1 tablet by mouth daily.   Yes [provider]  gabapentin (NEURONTIN) 300 MG capsule Take 1-2 capsules (300-600 mg total) by mouth See admin instructions. Take 1 capsule (300 mg) by mouth in the mornning & take 2 capsules (600 mg) by mouth at night 05/07/18  Yes Angiulli, Lavon Paganini, PA-C  levothyroxine (SYNTHROID, LEVOTHROID) 75 MCG tablet Take 1 tablet (75 mcg total) by mouth daily at 6 (six) AM. 06/24/18  Yes Love, Ivan Anchors, PA-C  Niacin (VITAMIN B-3 PO) Take 1 tablet by mouth 2 (two) times a day.   Yes [provider]  pantoprazole (PROTONIX) 40 MG tablet Take 1 tablet (40 mg total) by mouth daily. 06/24/18  Yes Love, Ivan Anchors, PA-C  sertraline (ZOLOFT) 25 MG tablet  09/02/18  Yes  [provider]  docusate sodium (COLACE) 100 MG capsule Take 1 capsule (100 mg total) by mouth daily. 06/24/18   Love, Ivan Anchors, PA-C  feeding supplement, ENSURE ENLIVE, (ENSURE ENLIVE) LIQD Take 237 mLs by mouth 2 (two) times daily between meals. 08/21/18   Mikhail, Velta Addison, DO  LORazepam (ATIVAN) 0.5 MG tablet Take 0.5 tablets (0.25 mg total) by mouth every 6 (six) hours as needed for anxiety or sleep. 08/22/18 08/22/19  Mikhail, Velta Addison, DO  nitroGLYCERIN (NITROSTAT) 0.4 MG SL tablet Place 1 tablet (0.4 mg total) under the tongue every 5 (five) minutes as needed for chest pain. 10/11/16   Fay Records, MD  tamsulosin (FLOMAX) 0.4 MG CAPS capsule Take 1 capsule (0.4 mg total) by mouth daily. 08/22/18   Cristal Ford, DO    Family History Family History  Problem Relation Age of Onset   Congestive Heart Failure Mother    Heart attack Father    Alcoholism Father    Alcoholism Sister    Alcoholism Brother    Alcoholism Brother    Throat cancer Brother     Social History Social History   Tobacco Use   Smoking status: Former Smoker    Packs/day: 0.50    Years: 4.00    Pack years: 2.00    Types: Cigarettes    Quit date: 08/07/1972    Years since quitting: 46.2   Smokeless tobacco: Never Used  Substance Use Topics   Alcohol use: Yes    Frequency: Never    Comment: once or twice a year   Drug use: Never     Allergies   Valium [diazepam], Robaxin [methocarbamol], Tramadol, Codeine, Darifenacin, Darvon [propoxyphene hcl], Daypro [oxaprozin], Enablex [darifenacin hydrobromide er], Oxycodone, Propoxyphene, Risperdal [risperidone], Talwin [pentazocine], and Vicodin [hydrocodone-acetaminophen]   Review of Systems Review of Systems  Constitutional: Negative for chills and fever.  HENT: Negative for trouble swallowing.   Eyes: Negative for visual disturbance.  Respiratory: Negative for cough and shortness of breath.   Cardiovascular: Negative for chest pain,  palpitations and leg swelling.  Gastrointestinal: Negative for abdominal pain, constipation, diarrhea, nausea and vomiting.  Genitourinary: Negative for dysuria and flank pain.  Musculoskeletal: Positive for myalgias. Negative for back pain and neck pain.  Skin: Negative for rash and wound.  Neurological: Positive for tremors and speech difficulty. Negative for dizziness, weakness, light-headedness, numbness and headaches.  Psychiatric/Behavioral: The patient is nervous/anxious.   All other systems reviewed and are negative.    Physical Exam Updated Vital Signs BP (!) 158/68 (BP Location: Right Arm)    Pulse 68    Temp 98.2 F (36.8 C) (Oral)    Resp 20    SpO2 96%   Physical Exam Vitals signs and nursing note reviewed.  Constitutional:      Appearance: She is well-developed.     Comments: Anxious appearing  HENT:     Head: Normocephalic and atraumatic.     Comments: No obvious head injury.  No facial asymmetry.    Mouth/Throat:     Mouth: Mucous membranes are moist.  Eyes:     Extraocular Movements: Extraocular movements intact.     Pupils: Pupils are equal, round, and reactive to light.     Comments: Few beats of fatigable horizontal nystagmus.  Neck:     Musculoskeletal: Normal range of motion and neck supple. No neck rigidity or muscular tenderness.  Cardiovascular:     Rate and Rhythm: Normal rate and regular rhythm.     Heart sounds: No murmur. No friction rub. No gallop.   Pulmonary:     Effort: Pulmonary effort is normal. No respiratory distress.     Breath sounds: Normal breath sounds. No stridor. No wheezing, rhonchi or rales.  Chest:     Chest wall: No tenderness.  Abdominal:     General: Bowel sounds are normal.     Palpations: Abdomen is soft.     Tenderness: There is no abdominal tenderness. There is no right CVA tenderness, left CVA tenderness, guarding or rebound.  Musculoskeletal: Normal range of motion.        General: Tenderness present. No swelling,  deformity or signs of injury.     Right lower leg: No edema.     Left lower leg: No edema.     Comments: Mild tenderness palpation in the left inguinal region.  Patient has full range of motion of the left hip with mild discomfort.  No lower extremity shortening.  Distal pulses intact.  Lymphadenopathy:     Cervical: No cervical adenopathy.  Skin:    General: Skin is warm and dry.     Findings: No erythema or rash.  Neurological:     General: No focal deficit present.     Mental Status: She is alert and oriented to person, place, and time.     Comments: Speech is clear.  5/5 motor in all extremities.  Sensation intact.  Patient has tremor noted to the right lower extremity which vanishes when she is focusing on something else.  Psychiatric:        Behavior: Behavior normal.      ED Treatments / Results  Labs (all labs ordered are listed, but only abnormal results are displayed) Labs Reviewed  CBC WITH DIFFERENTIAL/PLATELET - Abnormal; Notable for the following components:      Result Value   RBC 3.37 (*)    Hemoglobin 11.1 (*)    HCT 35.7 (*)    MCV 105.9 (*)    All other components within normal limits  COMPREHENSIVE METABOLIC PANEL - Abnormal; Notable for the following components:   Chloride 112 (*)    Glucose, Bld 101 (*)    BUN 26 (*)    Calcium 8.7 (*)    Albumin  3.3 (*)    All other components within normal limits  APTT - Abnormal; Notable for the following components:   aPTT 40 (*)    All other components within normal limits  PROTIME-INR  URINALYSIS, ROUTINE W REFLEX MICROSCOPIC    EKG EKG Interpretation  Date/Time:  Sunday October 18 2018 17:40:40 EDT Ventricular Rate:  80 PR Interval:    QRS Duration: 149 QT Interval:  417 QTC Calculation: 482 R Axis:   -73 Text Interpretation:  Atrial-paced rhythm RBBB and LAFB Probable left ventricular hypertrophy Baseline wander in lead(s) II aVR Confirmed by Julianne Rice [id number] on 10/18/2018 6:36:51  PM   Radiology Ct Head Wo Contrast  Result Date: 10/18/2018 CLINICAL DATA:  Slurred speech EXAM: CT HEAD WITHOUT CONTRAST TECHNIQUE: Contiguous axial images were obtained from the base of the skull through the vertex without intravenous contrast. COMPARISON:  08/20/2018 FINDINGS: Brain: No evidence of acute infarction, hemorrhage, hydrocephalus, extra-axial collection or mass lesion/mass effect. Periventricular and deep white matter hypodensity. Vascular: No hyperdense vessel or unexpected calcification. Skull: Normal. Negative for fracture or focal lesion. Sinuses/Orbits: No acute finding. Other: None. IMPRESSION: No acute intracranial pathology. Small-vessel white matter disease. MRI may be used to more sensitively evaluate for acute diffusion restricting infarction if suspected. Electronically Signed   By: Eddie Candle M.D.   On: 10/18/2018 19:00   Dg Hip Unilat W Or Wo Pelvis 2-3 Views Left  Result Date: 10/18/2018 CLINICAL DATA:  Left hip pain x several days EXAM: DG HIP (WITH OR WITHOUT PELVIS) 2-3V LEFT COMPARISON:  06/27/2018 FINDINGS: Status post ORIF of an old intertrochanteric left hip fracture. No evidence of hardware complication. Suspected new nondisplaced fracture involving the left superior pubic ramus. Old left inferior pubic ramus fracture deformity, superimposed acute fracture not discretely visualized but difficult to exclude. Status post ORIF of an old right hip fracture. No evidence of hardware complication. IMPRESSION: Acute nondisplaced left superior pubic ramus fracture. Old left inferior pubic ramus fracture deformity, acute superimposed fracture not discretely visualized but difficult to exclude. Status post ORIF of the bilateral hips, without evidence of hardware complication. Electronically Signed   By: Julian Hy M.D.   On: 10/18/2018 20:00    Procedures Procedures (including critical care time)  Medications Ordered in ED Medications  acetaminophen (TYLENOL)  tablet 650 mg (650 mg Oral Given 10/18/18 2225)     Initial Impression / Assessment and Plan / ED Course  I have reviewed the triage vital signs and the nursing notes.  Pertinent labs & imaging results that were available during my care of the patient were reviewed by me and considered in my medical decision making (see chart for details).        CT head without acute findings.  No definite neurologic deficit noted in the emergency department.  Son states patient speech is back to normal.  Patient was having difficulty ambulating and required 2 person assist and is only take a few steps.  She does live alone and normally ambulates well by herself using a walker.  Discussed with hospitalist who will accept in transfer for admit for possible TIA.  Patient may also benefit from rehab placement given the fact she is on anticoagulants and is a fall risk.  Final Clinical Impressions(s) / ED Diagnoses   Final diagnoses:  Closed fracture of superior ramus of left pubis, initial encounter ([place])  TIA (transient ischemic attack)    ED Discharge Orders    None  Julianne Rice, MD 10/18/18 2232

## 2018-10-19 ENCOUNTER — Other Ambulatory Visit: Payer: Self-pay

## 2018-10-19 ENCOUNTER — Inpatient Hospital Stay (HOSPITAL_COMMUNITY): Payer: Medicare Other

## 2018-10-19 ENCOUNTER — Encounter (HOSPITAL_COMMUNITY): Payer: Self-pay

## 2018-10-19 DIAGNOSIS — S32512A Fracture of superior rim of left pubis, initial encounter for closed fracture: Secondary | ICD-10-CM

## 2018-10-19 DIAGNOSIS — I5032 Chronic diastolic (congestive) heart failure: Secondary | ICD-10-CM

## 2018-10-19 DIAGNOSIS — G459 Transient cerebral ischemic attack, unspecified: Secondary | ICD-10-CM

## 2018-10-19 DIAGNOSIS — I4891 Unspecified atrial fibrillation: Secondary | ICD-10-CM

## 2018-10-19 DIAGNOSIS — R2990 Unspecified symptoms and signs involving the nervous system: Secondary | ICD-10-CM

## 2018-10-19 LAB — LIPID PANEL
Cholesterol: 94 mg/dL (ref 0–200)
HDL: 49 mg/dL (ref >40)
LDL Cholesterol: 32 mg/dL (ref 0–99)
Total CHOL/HDL Ratio: 1.9 RATIO
Triglycerides: 63 mg/dL (ref <150)
VLDL: 13 mg/dL (ref 0–40)

## 2018-10-19 LAB — ECHOCARDIOGRAM COMPLETE
Height: 65 in
Weight: 1865.97 oz

## 2018-10-19 LAB — HEMOGLOBIN A1C
Hgb A1c MFr Bld: 5.7 % — ABNORMAL HIGH (ref 4.8–5.6)
Mean Plasma Glucose: 116.89 mg/dL

## 2018-10-19 LAB — MRSA PCR SCREENING: MRSA by PCR: POSITIVE — AB

## 2018-10-19 MED ORDER — FLINTSTONES COMPLETE 60 MG PO CHEW: 1.0000 | CHEWABLE_TABLET | Freq: Every day | ORAL | Status: DC

## 2018-10-19 MED ORDER — LEVOTHYROXINE SODIUM 75 MCG PO TABS
75.0000 ug | ORAL_TABLET | Freq: Every day | ORAL | Status: DC
  Administered 2018-10-19 – 2018-10-22 (×4): 75 ug via ORAL
  Filled 2018-10-19 (×5): qty 1

## 2018-10-19 MED ORDER — DIPHENHYDRAMINE HCL 25 MG PO CAPS
25.0000 mg | ORAL_CAPSULE | Freq: Four times a day (QID) | ORAL | Status: DC | PRN
  Administered 2018-10-20: 10:00:00 25 mg via ORAL
  Filled 2018-10-19: qty 1

## 2018-10-19 MED ORDER — MUPIROCIN 2 % EX OINT
1.0000 "application " | TOPICAL_OINTMENT | Freq: Two times a day (BID) | CUTANEOUS | Status: DC
  Administered 2018-10-19 – 2018-10-22 (×6): 1 via NASAL
  Filled 2018-10-19: qty 22

## 2018-10-19 MED ORDER — CHLORHEXIDINE GLUCONATE CLOTH 2 % EX PADS
6.0000 | MEDICATED_PAD | Freq: Every day | CUTANEOUS | Status: DC
  Administered 2018-10-20: 6 via TOPICAL

## 2018-10-19 MED ORDER — FERROUS SULFATE 325 (65 FE) MG PO TABS
325.0000 mg | ORAL_TABLET | Freq: Two times a day (BID) | ORAL | Status: DC
  Administered 2018-10-19 – 2018-10-22 (×7): 325 mg via ORAL
  Filled 2018-10-19 (×7): qty 1

## 2018-10-19 MED ORDER — ACETAMINOPHEN 650 MG RE SUPP: 650.0000 mg | RECTAL | Status: DC | PRN

## 2018-10-19 MED ORDER — STROKE: EARLY STAGES OF RECOVERY BOOK
Freq: Once | Status: AC
  Administered 2018-10-19: 03:00:00
  Filled 2018-10-19: qty 1

## 2018-10-19 MED ORDER — ACETAMINOPHEN 325 MG PO TABS
650.0000 mg | ORAL_TABLET | ORAL | Status: DC | PRN
  Administered 2018-10-19 – 2018-10-22 (×9): 650 mg via ORAL
  Filled 2018-10-19 (×9): qty 2

## 2018-10-19 MED ORDER — ENSURE ENLIVE PO LIQD
237.0000 mL | Freq: Two times a day (BID) | ORAL | Status: DC
  Administered 2018-10-19 – 2018-10-22 (×2): 237 mL via ORAL

## 2018-10-19 MED ORDER — DICLOFENAC SODIUM 1 % TD GEL
2.0000 g | Freq: Four times a day (QID) | TRANSDERMAL | Status: DC
  Administered 2018-10-19 – 2018-10-22 (×6): 2 g via TOPICAL
  Filled 2018-10-19: qty 100

## 2018-10-19 MED ORDER — ACETAMINOPHEN 160 MG/5ML PO SOLN: 650.0000 mg | ORAL | Status: DC | PRN

## 2018-10-19 MED ORDER — AMIODARONE HCL 200 MG PO TABS
200.0000 mg | ORAL_TABLET | ORAL | Status: DC
  Administered 2018-10-19 – 2018-10-22 (×4): 200 mg via ORAL
  Filled 2018-10-19 (×5): qty 1

## 2018-10-19 MED ORDER — HYDROCODONE-ACETAMINOPHEN 5-325 MG PO TABS
1.0000 | ORAL_TABLET | Freq: Four times a day (QID) | ORAL | Status: DC | PRN
  Administered 2018-10-19 – 2018-10-20 (×2): 1 via ORAL
  Filled 2018-10-19: qty 1
  Filled 2018-10-19: qty 2
  Filled 2018-10-19: qty 1

## 2018-10-19 MED ORDER — GABAPENTIN 300 MG PO CAPS
600.0000 mg | ORAL_CAPSULE | Freq: Every day | ORAL | Status: DC
  Administered 2018-10-20 – 2018-10-21 (×2): 600 mg via ORAL
  Filled 2018-10-19 (×2): qty 2

## 2018-10-19 MED ORDER — LORAZEPAM 0.5 MG PO TABS
0.2500 mg | ORAL_TABLET | Freq: Four times a day (QID) | ORAL | Status: DC | PRN
  Administered 2018-10-21 – 2018-10-22 (×2): 0.25 mg via ORAL
  Filled 2018-10-19 (×2): qty 1

## 2018-10-19 MED ORDER — DOCUSATE SODIUM 100 MG PO CAPS
100.0000 mg | ORAL_CAPSULE | Freq: Every day | ORAL | Status: DC
  Administered 2018-10-19 – 2018-10-22 (×3): 100 mg via ORAL
  Filled 2018-10-19 (×4): qty 1

## 2018-10-19 MED ORDER — APIXABAN 2.5 MG PO TABS
2.5000 mg | ORAL_TABLET | Freq: Two times a day (BID) | ORAL | Status: DC
  Administered 2018-10-19 – 2018-10-22 (×7): 2.5 mg via ORAL
  Filled 2018-10-19 (×7): qty 1

## 2018-10-19 MED ORDER — GABAPENTIN 300 MG PO CAPS
300.0000 mg | ORAL_CAPSULE | Freq: Every morning | ORAL | Status: DC
  Administered 2018-10-19 – 2018-10-22 (×4): 300 mg via ORAL
  Filled 2018-10-19: qty 1
  Filled 2018-10-19: qty 3
  Filled 2018-10-19 (×2): qty 1

## 2018-10-19 MED ORDER — CALCITONIN (SALMON) 200 UNIT/ACT NA SOLN: 1.0000 | Freq: Every day | NASAL | Status: DC

## 2018-10-19 MED ORDER — MOMETASONE FURO-FORMOTEROL FUM 200-5 MCG/ACT IN AERO
2.0000 | INHALATION_SPRAY | Freq: Two times a day (BID) | RESPIRATORY_TRACT | Status: DC
  Administered 2018-10-19 – 2018-10-22 (×7): 2 via RESPIRATORY_TRACT
  Filled 2018-10-19: qty 8.8

## 2018-10-19 MED ORDER — ATORVASTATIN CALCIUM 10 MG PO TABS
20.0000 mg | ORAL_TABLET | Freq: Every day | ORAL | Status: DC
  Administered 2018-10-19 – 2018-10-22 (×4): 20 mg via ORAL
  Filled 2018-10-19 (×4): qty 2

## 2018-10-19 MED ORDER — ALBUTEROL SULFATE (2.5 MG/3ML) 0.083% IN NEBU: 2.5000 mg | INHALATION_SOLUTION | Freq: Four times a day (QID) | RESPIRATORY_TRACT | Status: DC | PRN

## 2018-10-19 MED ORDER — TAMSULOSIN HCL 0.4 MG PO CAPS
0.4000 mg | ORAL_CAPSULE | Freq: Every day | ORAL | Status: DC
  Administered 2018-10-19 – 2018-10-22 (×4): 0.4 mg via ORAL
  Filled 2018-10-19 (×4): qty 1

## 2018-10-19 MED ORDER — PANTOPRAZOLE SODIUM 40 MG PO TBEC
40.0000 mg | DELAYED_RELEASE_TABLET | Freq: Every day | ORAL | Status: DC
  Administered 2018-10-19 – 2018-10-22 (×4): 40 mg via ORAL
  Filled 2018-10-19 (×4): qty 1

## 2018-10-19 MED ORDER — SERTRALINE HCL 50 MG PO TABS
25.0000 mg | ORAL_TABLET | Freq: Every day | ORAL | Status: DC
  Administered 2018-10-19 – 2018-10-22 (×4): 25 mg via ORAL
  Filled 2018-10-19 (×4): qty 1

## 2018-10-19 NOTE — H&P (Addendum)
History and Physical    Meredith Gonzalez MWU:[id number] DOB: 28-Feb-1936 DOA: 10/18/2018  PCP: Sinclair Ship, MD  Patient coming from: Home  I have personally briefly reviewed patient's old medical records in [place]  Chief Complaint: Stroke like symptoms  HPI: Meredith Gonzalez is a 83 y.o. female with medical history significant of A.Fib on eliquis, prior stroke and multiple stroke work ups with stroke like symptoms so far this year.  Patient called son around 4pm today because she was having difficulty speaking.  Described as slurring words and difficulty getting her words out.  She was also having difficulty ambulating due to feeling off ballance as well as L groin pain which is severe and has been present for the past several days.  She thinks she may have twisted her groin the wrong way getting out of chair but doesn't mention a specific fall event.   ED Course: L superior pubic ramus fracture.  Her slurred speech has improved but she still has some difficulty getting her words out it seems.  She also has some L sided weakness which is new she reports.  CT head neg.  Patient transferred to [facility].   Review of Systems: As per HPI, otherwise all review of systems negative.  Past Medical History:  Diagnosis Date   Anemia    Anxiety    Arthritis    "all over" (02/11/2018)   Basal cell carcinoma    "left leg" (02/11/2018)   Cervical spine fracture ([place]) 12/2017   "C1-2"   CHF (congestive heart failure) (HCC)    Chronic bronchitis (HCC)    Chronic neck pain    "since I broke my neck 6-8 wk ago" (02/11/2018)   Diabetic peripheral neuropathy (HCC)    Fibromyalgia    Fracture of right humerus    Generalized weakness    Headache    "weekly" (02/11/2018)   History of blood transfusion    "related to one of my femur surgeries" (02/11/2018)   History of echocardiogram    Echo 8/18: EF 50-55, no RWMA, Gr 1 DD, calcified AV leaflets, MAC, trivial MR, mod LAE, PASP 37     Hypertension    Hypothyroidism    Incontinence of urine    Ischemic stroke ([place]) 2000   "lost part of the vision in my right eye" (02/11/2018)   Major depression, chronic    Myocardial infarction ([place]) 06/2016   Pacemaker    Recurrent falls    Syncope and collapse    Type 2 diabetes, diet controlled ([place])     Past Surgical History:  Procedure Laterality Date   ABDOMINAL HYSTERECTOMY     ANKLE FRACTURE SURGERY Right    BASAL CELL CARCINOMA EXCISION Left    "leg"   CARDIAC CATHETERIZATION  ~ 2014   CARDIOVERSION N/A 02/13/2018   Procedure: CARDIOVERSION;  Surgeon: Sueanne Margarita, MD;  Location: [place] [facility];  Service: Cardiovascular;  Laterality: N/A;   CARDIOVERSION N/A 03/27/2018   Procedure: CARDIOVERSION;  Surgeon: Lelon Perla, MD;  Location: [place];  Service: Cardiovascular;  Laterality: N/A;   CARPAL TUNNEL RELEASE     bilateral   CATARACT EXTRACTION W/ INTRAOCULAR LENS  IMPLANT, BILATERAL Bilateral    CHOLECYSTECTOMY OPEN     COLONOSCOPY     CORONARY ANGIOPLASTY WITH STENT PLACEMENT  06/2016   CORONARY STENT INTERVENTION N/A 07/19/2016   Procedure: Coronary Stent Intervention;  Surgeon: Peter M Martinique, MD;  Location: [place] [facility];  Service: Cardiovascular;  Laterality: N/A;  ENDARTERECTOMY Right 06/03/2018   Procedure: ENDARTERECTOMY CAROTID;  Surgeon: Rosetta Posner, MD;  Location: [place];  Service: Vascular;  Laterality: Right;   EYE SURGERY     right eye catarace/lens implant   FEMUR FRACTURE SURGERY Bilateral    FRACTURE SURGERY     GASTRIC BYPASS     INSERT / REPLACE / REMOVE PACEMAKER  ~ 2012   LEAD REVISION N/A 01/01/2013   Procedure: LEAD REVISION;  Surgeon: Evans Lance, MD;  Location: [facility] CATH LAB;  Service: Cardiovascular;  Laterality: N/A;   LEFT HEART CATH AND CORONARY ANGIOGRAPHY N/A 07/19/2016   Procedure: Left Heart Cath and Coronary Angiography;  Surgeon: Peter M Martinique, MD;  Location: [place] CV LAB;   Service: Cardiovascular;  Laterality: N/A;   OVARIAN CYST SURGERY     "one side only"   PATCH ANGIOPLASTY Right 06/03/2018   Procedure: PATCH ANGIOPLASTY USING HEMASHIELD PLATINUM FINESSE;  Surgeon: Rosetta Posner, MD;  Location: [place];  Service: Vascular;  Laterality: Right;   TEE WITHOUT CARDIOVERSION N/A 02/13/2018   Procedure: TRANSESOPHAGEAL ECHOCARDIOGRAM (TEE);  Surgeon: Sueanne Margarita, MD;  Location: [facility] ENDOSCOPY;  Service: Cardiovascular;  Laterality: N/A;   TONSILLECTOMY       reports that she quit smoking about 46 years ago. Her smoking use included cigarettes. She has a 2.00 pack-year smoking history. She has never used smokeless tobacco. She reports current alcohol use. She reports that she does not use drugs.  Allergies  Allergen Reactions   Valium [Diazepam] Anxiety    Makes patient hyper   Robaxin [Methocarbamol] Other (See Comments)    Makes pt loopy/acts drunk   Tramadol Other (See Comments)    sedation   Codeine Hives and Itching    Can take with Benadryl    Darifenacin Itching    Can take with Benadryl   Darvon [Propoxyphene Hcl] Itching    Can take with Benadryl   Daypro [Oxaprozin] Itching    Can take with Benadryl   Enablex [Darifenacin Hydrobromide Er] Itching    Can take with Benadryl   Oxycodone Itching    Can take with Benadryl    Propoxyphene Itching    Can take with Benadryl   Risperdal [Risperidone] Itching    Can take with Benadryl   Talwin [Pentazocine] Itching    Can take with Benadryl   Vicodin [Hydrocodone-Acetaminophen] Itching    Can take with Benadryl     Family History  Problem Relation Age of Onset   Congestive Heart Failure Mother    Heart attack Father    Alcoholism Father    Alcoholism Sister    Alcoholism Brother    Alcoholism Brother    Throat cancer Brother      Prior to Admission medications   Medication Sig Start Date End Date Taking? Authorizing Provider  acetaminophen (TYLENOL) 500 MG  tablet Take 500 mg by mouth every 6 (six) hours as needed for moderate pain.    Yes [provider]  albuterol (PROVENTIL HFA;VENTOLIN HFA) 108 (90 Base) MCG/ACT inhaler Inhale 2 puffs into the lungs every 6 (six) hours as needed for wheezing or shortness of breath. 05/07/18  Yes Angiulli, Lavon Paganini, PA-C  amiodarone (PACERONE) 200 MG tablet Take 1 tablet (200 mg total) by mouth every morning. 05/07/18  Yes Angiulli, Lavon Paganini, PA-C  apixaban (ELIQUIS) 2.5 MG TABS tablet Take 1 tablet (2.5 mg total) by mouth 2 (two) times daily. 07/13/18  Yes Domenic Polite, MD  atorvastatin (LIPITOR)  20 MG tablet Take 1 tablet (20 mg total) by mouth daily for 30 days. 06/05/18  Yes Dessa Phi, DO  budesonide-formoterol (SYMBICORT) 160-4.5 MCG/ACT inhaler Inhale 2 puffs into the lungs 2 (two) times daily. 05/20/18  Yes Mannam, Praveen, MD  calcitonin, salmon, (MIACALCIN/FORTICAL) 200 UNIT/ACT nasal spray Place 1 spray into the nose daily. 08/04/18  Yes [provider]  diclofenac sodium (VOLTAREN) 1 % GEL Apply 2 g topically 4 (four) times daily. 08/21/18  Yes Mikhail, Velta Addison, DO  ferrous sulfate 325 (65 FE) MG tablet Take 1 tablet (325 mg total) by mouth 2 (two) times daily with a meal. 05/07/18  Yes Angiulli, Lavon Paganini, PA-C  flintstones complete (FLINTSTONES) 60 MG chewable tablet Chew 1 tablet by mouth daily.   Yes [provider]  gabapentin (NEURONTIN) 300 MG capsule Take 1-2 capsules (300-600 mg total) by mouth See admin instructions. Take 1 capsule (300 mg) by mouth in the mornning & take 2 capsules (600 mg) by mouth at night 05/07/18  Yes Angiulli, Lavon Paganini, PA-C  levothyroxine (SYNTHROID, LEVOTHROID) 75 MCG tablet Take 1 tablet (75 mcg total) by mouth daily at 6 (six) AM. 06/24/18  Yes Love, Ivan Anchors, PA-C  Niacin (VITAMIN B-3 PO) Take 1 tablet by mouth 2 (two) times a day.   Yes [provider]  pantoprazole (PROTONIX) 40 MG tablet Take 1 tablet (40 mg total) by mouth daily. 06/24/18   Yes Love, Ivan Anchors, PA-C  sertraline (ZOLOFT) 25 MG tablet  09/02/18  Yes [provider]  docusate sodium (COLACE) 100 MG capsule Take 1 capsule (100 mg total) by mouth daily. 06/24/18   Love, Ivan Anchors, PA-C  feeding supplement, ENSURE ENLIVE, (ENSURE ENLIVE) LIQD Take 237 mLs by mouth 2 (two) times daily between meals. 08/21/18   Mikhail, Velta Addison, DO  LORazepam (ATIVAN) 0.5 MG tablet Take 0.5 tablets (0.25 mg total) by mouth every 6 (six) hours as needed for anxiety or sleep. 08/22/18 08/22/19  Mikhail, Velta Addison, DO  nitroGLYCERIN (NITROSTAT) 0.4 MG SL tablet Place 1 tablet (0.4 mg total) under the tongue every 5 (five) minutes as needed for chest pain. 10/11/16   Fay Records, MD  tamsulosin (FLOMAX) 0.4 MG CAPS capsule Take 1 capsule (0.4 mg total) by mouth daily. 08/22/18   Cristal Ford, DO    Physical Exam: Vitals:   10/19/18 0030 10/19/18 0056 10/19/18 0155 10/19/18 0200  BP: (!) 163/72 (!) 167/82 (!) 181/63 (!) 161/70  Pulse: 79 76 63   Resp: _0 Temp:   97.9 F (36.6 C)   TempSrc:   Oral   SpO2: 94% 94% 98% 99%  Weight:   52.9 kg   Height:   _1  (1.651 m)     Constitutional: NAD, calm, comfortable Eyes: PERRL, lids and conjunctivae normal ENMT: Mucous membranes are moist. Posterior pharynx clear of any exudate or lesions.Normal dentition.  Neck: normal, supple, no masses, no thyromegaly Respiratory: clear to auscultation bilaterally, no wheezing, no crackles. Normal respiratory effort. No accessory muscle use.  Cardiovascular: Regular rate and rhythm, no murmurs / rubs / gallops. No extremity edema. 2+ pedal pulses. No carotid bruits.  Abdomen: no tenderness, no masses palpated. No hepatosplenomegaly. Bowel sounds positive.  Musculoskeletal: no clubbing / cyanosis. No joint deformity upper and lower extremities. Good ROM, no contractures. Normal muscle tone.  Skin: no rashes, lesions, ulcers. No induration Neurologic: Stuttering speech, no obvious facial  droop, 4/5 strength LUE, 5/5 strength RUE, RLE.  LLE 4/5  but cant tell how much of the weakness is due to pain vs how much due to neurologic weakness. Psychiatric: Normal judgment and insight. Alert and oriented x 3. Normal mood.    Labs on Admission: I have personally reviewed following labs and imaging studies  CBC: Recent Labs  Lab 10/18/18 1817  WBC 6.3  NEUTROABS 4.5  HGB 11.1*  HCT 35.7*  MCV 105.9*  PLT 324   Basic Metabolic Panel: Recent Labs  Lab 10/18/18 1817  NA 141  K 4.1  CL 112*  CO2 22  GLUCOSE 101*  BUN 26*  CREATININE 0.74  CALCIUM 8.7*   GFR: Estimated Creatinine Clearance: 46.1 mL/min (by C-G formula based on SCr of 0.74 mg/dL). Liver Function Tests: Recent Labs  Lab 10/18/18 1817  AST 28  ALT 24  ALKPHOS 94  BILITOT 0.6  PROT 6.6  ALBUMIN 3.3*   No results for input(s): LIPASE, AMYLASE in the last 168 hours. No results for input(s): AMMONIA in the last 168 hours. Coagulation Profile: Recent Labs  Lab 10/18/18 1817  INR 1.2   Cardiac Enzymes: No results for input(s): CKTOTAL, CKMB, CKMBINDEX, TROPONINI in the last 168 hours. BNP (last 3 results) No results for input(s): PROBNP in the last 8760 hours. HbA1C: No results for input(s): HGBA1C in the last 72 hours. CBG: No results for input(s): GLUCAP in the last 168 hours. Lipid Profile: No results for input(s): CHOL, HDL, LDLCALC, TRIG, CHOLHDL, LDLDIRECT in the last 72 hours. Thyroid Function Tests: No results for input(s): TSH, T4TOTAL, FREET4, T3FREE, THYROIDAB in the last 72 hours. Anemia Panel: No results for input(s): VITAMINB12, FOLATE, FERRITIN, TIBC, IRON, RETICCTPCT in the last 72 hours. Urine analysis:    Component Value Date/Time   COLORURINE YELLOW 10/18/2018 1933   APPEARANCEUR CLEAR 10/18/2018 1933   LABSPEC 1.025 10/18/2018 1933   PHURINE 6.0 10/18/2018 1933   GLUCOSEU NEGATIVE 10/18/2018 [place] NEGATIVE 10/18/2018 [place] NEGATIVE 10/18/2018  [place] NEGATIVE 10/18/2018 1933   PROTEINUR NEGATIVE 10/18/2018 1933   NITRITE NEGATIVE 10/18/2018 1933   LEUKOCYTESUR NEGATIVE 10/18/2018 1933    Radiological Exams on Admission: Ct Head Wo Contrast  Result Date: 10/18/2018 CLINICAL DATA:  Slurred speech EXAM: CT HEAD WITHOUT CONTRAST TECHNIQUE: Contiguous axial images were obtained from the base of the skull through the vertex without intravenous contrast. COMPARISON:  08/20/2018 FINDINGS: Brain: No evidence of acute infarction, hemorrhage, hydrocephalus, extra-axial collection or mass lesion/mass effect. Periventricular and deep white matter hypodensity. Vascular: No hyperdense vessel or unexpected calcification. Skull: Normal. Negative for fracture or focal lesion. Sinuses/Orbits: No acute finding. Other: None. IMPRESSION: No acute intracranial pathology. Small-vessel white matter disease. MRI may be used to more sensitively evaluate for acute diffusion restricting infarction if suspected. Electronically Signed   By: Eddie Candle M.D.   On: 10/18/2018 19:00   Dg Hip Unilat W Or Wo Pelvis 2-3 Views Left  Result Date: 10/18/2018 CLINICAL DATA:  Left hip pain x several days EXAM: DG HIP (WITH OR WITHOUT PELVIS) 2-3V LEFT COMPARISON:  06/27/2018 FINDINGS: Status post ORIF of an old intertrochanteric left hip fracture. No evidence of hardware complication. Suspected new nondisplaced fracture involving the left superior pubic ramus. Old left inferior pubic ramus fracture deformity, superimposed acute fracture not discretely visualized but difficult to exclude. Status post ORIF of an old right hip fracture. No evidence of hardware complication. IMPRESSION: Acute nondisplaced left superior pubic ramus fracture. Old left inferior pubic ramus fracture deformity, acute  superimposed fracture not discretely visualized but difficult to exclude. Status post ORIF of the bilateral hips, without evidence of hardware complication. Electronically Signed    By: Julian Hy M.D.   On: 10/18/2018 20:00    EKG: Independently reviewed.  Assessment/Plan Principal Problem:   Closed fracture of left superior pubic ramus, initial encounter [facility]) Active Problems:   Atrial fibrillation (HCC)   Chronic diastolic congestive heart failure (HCC)   Stroke-like symptom    1. L superior pubic ramus fx - 1. PT/OT 2. Pain control with norco PRN 2. Stroke like symptoms - 1. Aphasia and LUE weakness, ? LLE weakness vs LLE weakness due to fracture 2. Stroke pathway 3. Dr. Leonel Ramsay evaluated patient: 1. He tells me that he doubts that this represents a new stroke today. 2. Patient tells him that L arm weakness is chronic 4. 2d echo 5. Carotid dopplers 6. PT/OT/SLP 7. Continue eliquis 8. Repeat CT head ordered for noon (Cant MRI due to PPM). 9. Cont statin 3. A.Fib - 1. Cont eliquis 2. Cont amiodarone  DVT prophylaxis: Eliquis Code Status: Full Family Communication: No family in room Disposition Plan: TBD Consults called: Spoke with Dr. Leonel Ramsay Admission status: Admit to inpatient  Severity of Illness: The appropriate patient status for this patient is INPATIENT. Inpatient status is judged to be reasonable and necessary in order to provide the required intensity of service to ensure the patient's safety. The patient's presenting symptoms, physical exam findings, and initial radiographic and laboratory data in the context of their chronic comorbidities is felt to place them at high risk for further clinical deterioration. Furthermore, it is not anticipated that the patient will be medically stable for discharge from the hospital within 2 midnights of admission. The following factors support the patient status of inpatient.   Patient unable to ambulate secondary to L superior pubic ramus fracture.  New acute stroke may also be playing a role in her inability to ambulate.   * I certify that at the point of admission it is my clinical  judgment that the patient will require inpatient hospital care spanning beyond 2 midnights from the point of admission due to high intensity of service, high risk for further deterioration and high frequency of surveillance required.*    Lavern Crimi M. DO [facility]  How to contact the [facility] Attending or Consulting provider [place] or covering provider during after hours [place], for this patient?  1. Check the care team in [facility] and look for a) attending/consulting [facility] provider listed and b) the [facility] team listed 2. Log into [email or web address]  Amion Physician Scheduling and messaging for groups and whole hospitals  On call and physician scheduling software for group practices, residents, hospitalists and other medical providers for call, clinic, rotation and shift schedules. OnCall [employer] is a hospital-wide system for scheduling doctors and paging doctors on call. EasyPlot is for scientific plotting and data analysis.  [email or web address]  and use [place]'s universal password to access. If you do not have the password, please contact the hospital operator.  3. Locate the [facility] provider you are looking for under [facility] and page to a number that you can be directly reached. 4. If you still have difficulty reaching the provider, please page the [facility] (Director on Call) for the Hospitalists listed on amion for assistance.  10/19/2018, 2:57 AM

## 2018-10-19 NOTE — Progress Notes (Addendum)
PROGRESS NOTE  Jonnelle Lawniczak MWN:[id number] DOB: 03/03/1936 DOA: 10/18/2018 PCP: Sinclair Ship, MD  Brief History    Mayvis Agudelo is a 83 y.o. female with medical history significant of A.Fib on eliquis, prior stroke and multiple stroke work ups with stroke like symptoms so far this year.   Patient called son around 4pm today because she was having difficulty speaking.  Described as slurring words and difficulty getting her words out.  She was also having difficulty ambulating due to feeling off ballance as well as L groin pain which is severe and has been present for the past several days.  She thinks she may have twisted her groin the wrong way getting out of chair but doesn't mention a specific fall event.   ED Course: L superior pubic ramus fracture.  Her slurred speech has improved but she still has some difficulty getting her words out it seems.  She also has some L sided weakness which is new she reports.   CT head neg.  Patient transferred to [facility]. Neurology was consulted. She is on the stroke pathway. Lt arm weakness appears to be chronic. 2D echo and carotid dopplers are pending. Repeat CT brain will be performed tomorrow. The patient is unable to have MRI due to PPM. PT/OT/SLP have been consulted and will evaluate the patient. She will be continued on eliquis which she takes for atrial fibrillation.   Consultants  Neurology  Procedures  None  Antibiotics   Anti-infectives (From admission, onward)    None       Subjective  The patient is resting comfortably. No new complaints.  Objective   Vitals:  Vitals:   10/19/18 1150 10/19/18 1604  BP: (!) 116/49 132/75  Pulse: 68 68  Resp: 20 16  Temp: 98.5 F (36.9 C) 97.9 F (36.6 C)  SpO2: 97% 98%    Exam:  Constitutional:  The patient is awake, alert, and oriented x 3. No acute distress. Respiratory:  There is no increased work of breathing. No wheezes, rales, or rhonchi- No tactile fremitus Cardiovascular:   Regular rate and rhythm No murmurs, ectopy, or gallups. No lateral PMI. No thrills.  Abdomen:  Abdomen is soft, non-tender, non-distended No hernias, masses, or organomegaly. Normoactive bowel sounds.  Musculoskeletal:  No cyanosis, clubbing, or edema Skin:  No rashes, lesions, ulcers palpation of skin: no induration or nodules Neurologic:  CN 2-12 intact Weakness left upper and lower extremity ? Patient states is chronic. Psychiatric:  Mental status Mood, affect appropriate Orientation to person, place, time  judgment and insight appear intact    I have personally reviewed the following:   Today's Data  Vitals, lipid panels  Imaging  Right Carotid: 1-39% stenosis, CEA remains patent. Left Carotid: 1-39 stenosis. No change since 05/31/2018 Vertebrals: B/L vertebral arteries demonstrate antegrade flow.  Subclavians: Normal flow hemodynamics bilaterally.  Cardiology Data  EF 60-65% Impaired relaxation  Scheduled Meds:  amiodarone  200 mg Oral [id number]   apixaban  2.5 mg Oral BID   atorvastatin  20 mg Oral Daily   Chlorhexidine Gluconate Cloth  6 each Topical Q0600   diclofenac sodium  2 g Topical QID   docusate sodium  100 mg Oral Daily   feeding supplement (ENSURE ENLIVE)  237 mL Oral BID BM   ferrous sulfate  325 mg Oral BID WC   gabapentin  300 mg Oral q morning - 10a   [START ON 10/20/2018] gabapentin  600 mg Oral QHS   levothyroxine  75 mcg  Oral Q0600   mometasone-formoterol  2 puff Inhalation BID   mupirocin ointment  1 application Nasal BID   pantoprazole  40 mg Oral Daily   sertraline  25 mg Oral Daily   tamsulosin  0.4 mg Oral Daily   Continuous Infusions:   Principal Problem:   Closed fracture of left superior pubic ramus, initial encounter (HCC) Active Problems:   Atrial fibrillation (HCC)   Chronic diastolic congestive heart failure (HCC)   Stroke-like symptom   LOS: 1 day   A & P  L superior pubic ramus fx : PT/OT has been consulted. Pain  control with norco PRN.  Stroke like symptoms: The patient now states that left upper extremity weakness is chronic. Her aphasia has improved. Unclear if weakness in LLE is due to pelvic fracture or due to neurological deficits. Patient will have a repeat CT tomorrow to better evaluate. Dr. Leonel Ramsay doubts that this represents a new stroke today. Continue statin and eliquis.  Atrial fibrillation (Type: Clinically undetermined): Continue eliquis and amiodarone.   I have seen and examined this patient myself. I have spent 35 minutes in her evaluation and care.   DVT prophylaxis: Eliquis Code Status: Full Family Communication: No family in room Disposition Plan: TBD  Linzi Ohlinger, DO [facility] Direct contact: see [email or web address]  7PM-7AM contact night coverage as above 10/19/2018, 4:17 PM  LOS: 1 day

## 2018-10-19 NOTE — Consult Note (Signed)
Neurology Consultation Reason for Consult: Left-sided weakness Referring Physician: Sheran Luz  CC: Left-sided weakness  History is obtained from: Patient, chart  HPI: Meredith Gonzalez is a 83 y.o. female with a history of likely previous stroke in March with some persistent left-sided weakness per the patient.  She was evaluated again in May with stuttering speech, and this was of less clear etiology.  Patient reports, however that she has had some left arm weakness since that time and that her left arm weakness is at baseline.  Few days ago, she was turning her walker and felt something pop in her hip and has been having significant pain in problems with her legs since then.  She has noticed some mild slurred speech.  She denies any new numbness or visual change    ROS: A 14 point ROS was performed and is negative except as noted in the HPI.   Past Medical History:  Diagnosis Date  . Anemia   . Anxiety   . Arthritis    "all over" (02/11/2018)  . Basal cell carcinoma    "left leg" (02/11/2018)  . Cervical spine fracture ([place]) 12/2017   "C1-2"  . CHF (congestive heart failure) ([place])   . Chronic bronchitis ([place])   . Chronic neck pain    "since I broke my neck 6-8 wk ago" (02/11/2018)  . Diabetic peripheral neuropathy ([place])   . Fibromyalgia   . Fracture of right humerus   . Generalized weakness   . Headache    "weekly" (02/11/2018)  . History of blood transfusion    "related to one of my femur surgeries" (02/11/2018)  . History of echocardiogram    Echo 8/18: EF 50-55, no RWMA, Gr 1 DD, calcified AV leaflets, MAC, trivial MR, mod LAE, PASP 37  . Hypertension   . Hypothyroidism   . Incontinence of urine   . Ischemic stroke ([place]) 2000   "lost part of the vision in my right eye" (02/11/2018)  . Major depression, chronic   . Myocardial infarction ([place]) 06/2016  . Pacemaker   . Recurrent falls   . Syncope and collapse   . Type 2 diabetes, diet controlled ([place])      Family  History  Problem Relation Age of Onset  . Congestive Heart Failure Mother   . Heart attack Father   . Alcoholism Father   . Alcoholism Sister   . Alcoholism Brother   . Alcoholism Brother   . Throat cancer Brother      Social History:  reports that she quit smoking about 46 years ago. Her smoking use included cigarettes. She has a 2.00 pack-year smoking history. She has never used smokeless tobacco. She reports current alcohol use. She reports that she does not use drugs.   Exam: Current vital signs: BP (!) 136/52 (BP Location: Right Arm)   Pulse 74   Temp 97.6 F (36.4 C) (Oral)   Resp 18   Ht _0  (1.651 m)   Wt 52.9 kg   SpO2 100%   BMI 19.41 kg/m  Vital signs in last 24 hours: Temp:  [97.6 F (36.4 C)-98.2 F (36.8 C)] 97.6 F (36.4 C) (07/27 0400) Pulse Rate:  [63-81] 74 (07/27 0400) Resp:  [14-22] 18 (07/27 0200) BP: (121-181)/(52-98) 136/52 (07/27 0400) SpO2:  [92 %-100 %] 100 % (07/27 0400) Weight:  [52.9 kg] 52.9 kg (07/27 0155)   Physical Exam  Constitutional: Appears well-developed and well-nourished.  Psych: Affect appropriate to situation Eyes: No scleral injection  HENT: No OP obstrucion Head: Normocephalic.  Cardiovascular: Normal rate and regular rhythm.  Respiratory: Effort normal, non-labored breathing GI: Soft.  No distension. There is no tenderness.  Skin: WDI  Neuro: Mental Status: Patient is awake, alert, oriented to person, place, month, year, and situation. Patient is able to give a clear and coherent history. No signs of aphasia or neglect Cranial Nerves: II: Visual Fields are full. Pupils are equal, round, and reactive to light.   III,IV, VI: EOMI without ptosis or diploplia.  V: Facial sensation is symmetric to temperature VII: Facial movement is symmetric.  VIII: hearing is intact to voice X: Uvula elevates symmetrically XI: Shoulder shrug is symmetric. XII: tongue is midline without atrophy or fasciculations.  Motor: Tone  is normal. Bulk is normal.  She has 4/5 weakness of her left upper extremity, though only very mild drift, her left leg is difficult to assess due to pain, but at the ankles she has 5/5 strength bilaterally Sensory: Sensation is symmetric to light touch and temperature in the arms and legs. Cerebellar: FNF with mild tremor without past-pointing bilaterally   I have reviewed labs in epic and the results pertinent to this consultation are: Creatinine 0.7  I have reviewed the images obtained: CT head-unremarkable  Impression: 83 year old female with pelvic fracture which I suspect is responsible for most of her symptoms.  With multiple recent stroke work-ups and with patient already on anticoagulation, I do not think it would be very high yield at all to do any further stroke work-up.  Without any clear symptoms referable to CVA, I do not think we need to hold apixaban, especially since if there was any stroke it would be quite small.  Her slurred speech, if referrable to cerebral vasculature, could certainly be unmasking of previous deficit in the setting of physiological stress.  Recommendations: 1) no further recommendations at this time, please call with further questions or concerns.   Roland Rack, MD [facility] [phone]  If 7pm- 7am, please page neurology on call as listed in [place].

## 2018-10-19 NOTE — Evaluation (Signed)
Occupational Therapy Evaluation Patient Details Name: Meredith Gonzalez MRN: [id number] DOB: 1935-08-24 Today's Date: 10/19/2018    History of Present Illness 83 y.o. female with a history of likely previous stroke in March with some persistent left-sided weakness per the patient.  She was evaluated again in May with stuttering speech, and this was of less clear etiology.  Patient reports, however that she has had some left arm weakness since that time and that her left arm weakness is at baseline.  Few days ago, she was turning her walker and felt something pop in her hip and has been having significant pain in problems with her legs since then. Pt with L superior pubic ramus fracture   Clinical Impression   Patient presenting with decreased I in self care, balance, functional mobility/transfers, endurance, pain management, and safety awareness. Patient lives at assisted living where she reports performing all self care tasks independently and aide comes 6 hrs daily and 7 days a wk to assist with IADLs PTA. Patient currently functioning at min - max A ( LB self care). Patient will benefit from acute OT to increase overall independence in the areas of ADLs, functional mobility, and safety awareness in order to safely discharge to next venue of care.    Follow Up Recommendations  SNF;Supervision/Assistance - 24 hour    Equipment Recommendations  None recommended by OT       Precautions / Restrictions Precautions Precautions: Fall      Mobility Bed Mobility Overal bed mobility: Needs Assistance Bed Mobility: Supine to Sit     Supine to sit: Min assist     General bed mobility comments: min verbal cuing for hand placement and technique  Transfers Overall transfer level: Needs assistance Equipment used: Rolling walker (2 wheeled) Transfers: Sit to/from [facility] Sit to Stand: +2 physical assistance;Min assist Stand pivot transfers: +2 physical assistance;Min assist             Balance Overall balance assessment: Needs assistance Sitting-balance support: Feet supported Sitting balance-Leahy Scale: Good     Standing balance support: During functional activity Standing balance-Leahy Scale: Fair Standing balance comment: UE support of RW           ADL either performed or assessed with clinical judgement   ADL Overall ADL's : Needs assistance/impaired Eating/Feeding: Modified independent   Grooming: Wash/dry hands;Wash/dry face;Oral care;Sitting   Upper Body Bathing: Set up;Sitting   Lower Body Bathing: Moderate assistance;Sit to/from stand   Upper Body Dressing : Set up;Sitting   Lower Body Dressing: Moderate assistance;Sit to/from stand            Vision Baseline Vision/History: Wears glasses Wears Glasses: Reading only Patient Visual Report: No change from baseline Vision Assessment?: Yes            Pertinent Vitals/Pain Pain Assessment: 0-10 Pain Score: 7  Pain Location: pelvis Pain Descriptors / Indicators: Aching;Discomfort;Guarding;Grimacing Pain Intervention(s): Limited activity within patient's tolerance;Monitored during session;Premedicated before session;Repositioned     Hand Dominance Right   Extremity/Trunk Assessment Upper Extremity Assessment Upper Extremity Assessment: Overall WFL for tasks assessed   Lower Extremity Assessment Lower Extremity Assessment: Defer to PT evaluation       Communication Communication Communication: No difficulties   Cognition Arousal/Alertness: Awake/alert Behavior During Therapy: WFL for tasks assessed/performed Overall Cognitive Status: Within Functional Limits for tasks assessed  Home Living Family/patient expects to be discharged to:: Assisted living Living Arrangements: Alone     Home Equipment: Walker - 4 wheels;Walker - 2 wheels;Bedside commode;Shower seat - built in;Grab bars - tub/shower;Adaptive equipment Adaptive  Equipment: [place] Additional Comments: She has an aide that assist her with IADL tasks from 10- 4 pm / 7 days a week      Prior Functioning/Environment Level of Independence: Needs assistance  Gait / Transfers Assistance Needed: mod I per pt report with RW ADL's / Homemaking Assistance Needed: Pt reports performing self care at mod I level   Comments: aide assist with IADL tasks 6 hrs/7 days a week        OT Problem List: Decreased strength;Pain;Decreased activity tolerance;Decreased safety awareness;Impaired balance (sitting and/or standing)      OT Treatment/Interventions: Self-care/ADL training;Therapeutic exercise;Therapeutic activities;Cognitive remediation/compensation;Energy conservation;DME and/or AE instruction;Patient/family education;Balance training;Modalities    OT Goals(Current goals can be found in the care plan section) Acute Rehab OT Goals Patient Stated Goal: to get better OT Goal Formulation: With patient Time For Goal Achievement: 11/02/18 Potential to Achieve Goals: Good ADL Goals Pt Will Perform Grooming: standing;with supervision Pt Will Perform Lower Body Bathing: with min guard assist;sit to/from stand Pt Will Perform Lower Body Dressing: with min guard assist;sit to/from stand Pt Will Transfer to Toilet: with min guard assist;ambulating Pt Will Perform Toileting - Clothing Manipulation and hygiene: with min guard assist;sit to/from stand  OT Frequency: Min 2X/week   Barriers to D/C: Decreased caregiver support          Co-evaluation PT/OT/SLP Co-Evaluation/Treatment: Yes Reason for Co-Treatment: For patient/therapist safety;To address functional/ADL transfers   OT goals addressed during session: ADL's and self-care;Proper use of Adaptive equipment and DME      AM-PAC OT "6 Clicks" Daily Activity     Outcome Measure Help from another person eating meals?: None Help from another person taking care of personal grooming?: A Little Help from  another person toileting, which includes using toliet, bedpan, or urinal?: A Lot Help from another person bathing (including washing, rinsing, drying)?: A Lot Help from another person to put on and taking off regular upper body clothing?: A Little Help from another person to put on and taking off regular lower body clothing?: A Lot 6 Click Score: 16   End of Session Equipment Utilized During Treatment: Gait belt;Rolling walker Nurse Communication: Mobility status  Activity Tolerance: Patient tolerated treatment well Patient left: in chair;with call bell/phone within reach;with chair alarm set  OT Visit Diagnosis: History of falling (Z91.81);Muscle weakness (generalized) (M62.81);Unsteadiness on feet (R26.81)                Time: 0998-3382 OT Time Calculation (min): 28 min Charges:  OT General Charges $OT Visit: 1 Visit OT Evaluation $OT Eval Moderate Complexity: 1 Mod  Uel Davidow P 10/19/2018, 3:11 PM

## 2018-10-19 NOTE — ED Notes (Signed)
Spoke with patient's son; updated him on room assignment.

## 2018-10-19 NOTE — Progress Notes (Signed)
  Echocardiogram 2D Echocardiogram has been performed.  Meredith Gonzalez 10/19/2018, 9:03 AM

## 2018-10-19 NOTE — Progress Notes (Signed)
VASCULAR LAB PRELIMINARY  PRELIMINARY  PRELIMINARY  PRELIMINARY  Carotid duplex completed.    Preliminary report:  See CV proc for preliminary results.   Kadan Millstein, RVT 10/19/2018, 12:12 PM

## 2018-10-19 NOTE — Evaluation (Signed)
Physical Therapy Evaluation Patient Details Name: Meredith Gonzalez MRN: [id number] DOB: 1935-09-18 Today's Date: 10/19/2018   History of Present Illness  83 y.o. female with a history of likely previous stroke in March with some persistent left-sided weakness per the patient.  She was evaluated again in May with stuttering speech, and this was of less clear etiology.  Patient reports, however that she has had some left arm weakness since that time and that her left arm weakness is at baseline.  Few days ago, she was turning her walker and felt something pop in her hip and has been having significant pain in problems with her legs since then. X-Ray revealed L superior pubic ramus fracture  Clinical Impression  Pt admitted with above diagnosis. Pt currently with functional limitations due to the deficits listed below (see PT Problem List). At the time of PT eval pt was able to perform transfers with light +2 assist and RW for support. Anticipate pt will progress well however at this time requiring significant assist for basic transfers. Pt reports she will have a difficult time managing at home, and I agree that continued rehab at the SNF level would be appropriate to maximize functional independence and return to PLOF. Pt is adamant that she does NOT want to go to [facility] for rehab. Acutely, pt will benefit from skilled PT to increase their independence and safety with mobility to allow discharge to the venue listed below.       Follow Up Recommendations SNF;Supervision/Assistance - 24 hour    Equipment Recommendations  None recommended by PT    Recommendations for Other Services       Precautions / Restrictions Precautions Precautions: Fall Restrictions Weight Bearing Restrictions: Yes LLE Weight Bearing: Weight bearing as tolerated      Mobility  Bed Mobility Overal bed mobility: Needs Assistance Bed Mobility: Supine to Sit     Supine to sit: Min assist     General bed mobility  comments: min verbal cuing for hand placement and technique  Transfers Overall transfer level: Needs assistance Equipment used: Rolling walker (2 wheeled) Transfers: Sit to/from [facility] Sit to Stand: +2 physical assistance;Min assist Stand pivot transfers: +2 physical assistance;Min assist       General transfer comment: VC's for hand placement on seated surface for safety. Pt was slow to advance LE's around to chair but was able to complete.   Ambulation/Gait                Stairs            Wheelchair Mobility    Modified Rankin (Stroke Patients Only)       Balance Overall balance assessment: Needs assistance Sitting-balance support: Feet supported Sitting balance-Leahy Scale: Good     Standing balance support: During functional activity Standing balance-Leahy Scale: Fair Standing balance comment: UE support of RW                             Pertinent Vitals/Pain Pain Assessment: Faces Pain Score: 7  Faces Pain Scale: Hurts little more Pain Location: pelvis, L groin Pain Descriptors / Indicators: Aching;Discomfort;Guarding;Grimacing Pain Intervention(s): Limited activity within patient's tolerance;Monitored during session;Repositioned;Premedicated before session    Home Living Family/patient expects to be discharged to:: Assisted living Living Arrangements: Alone             Home Equipment: Walker - 4 wheels;Walker - 2 wheels;Bedside commode;Shower seat - built in;Grab bars -  tub/shower;Adaptive equipment Additional Comments: She has an aide that assist her with IADL tasks from 10- 4 pm / 7 days a week    Prior Function Level of Independence: Needs assistance   Gait / Transfers Assistance Needed: mod I per pt report with RW  ADL's / Homemaking Assistance Needed: Pt reports performing self care at mod I level  Comments: aide assist with IADL tasks 6 hrs/7 days a week     Hand Dominance   Dominant Hand:  Right    Extremity/Trunk Assessment   Upper Extremity Assessment Upper Extremity Assessment: Defer to OT evaluation    Lower Extremity Assessment Lower Extremity Assessment: LLE deficits/detail LLE Deficits / Details: Decreased strength and AROM consistent with groin pain from pelvic fracture    Cervical / Trunk Assessment Cervical / Trunk Assessment: Kyphotic  Communication   Communication: No difficulties  Cognition Arousal/Alertness: Awake/alert Behavior During Therapy: WFL for tasks assessed/performed Overall Cognitive Status: Within Functional Limits for tasks assessed                                        General Comments      Exercises     Assessment/Plan    PT Assessment Patient needs continued PT services  PT Problem List Decreased strength;Decreased range of motion;Decreased activity tolerance;Decreased balance;Decreased mobility;Decreased knowledge of use of DME;Decreased safety awareness;Decreased knowledge of precautions;Decreased coordination;Pain       PT Treatment Interventions DME instruction;Gait training;Stair training;Functional mobility training;Therapeutic activities;Therapeutic exercise;Neuromuscular re-education;Patient/family education    PT Goals (Current goals can be found in the Care Plan section)  Acute Rehab PT Goals Patient Stated Goal: to get better PT Goal Formulation: With patient Time For Goal Achievement: 11/02/18 Potential to Achieve Goals: Good    Frequency Min 3X/week   Barriers to discharge        Co-evaluation PT/OT/SLP Co-Evaluation/Treatment: Yes Reason for Co-Treatment: For patient/therapist safety;To address functional/ADL transfers PT goals addressed during session: Mobility/safety with mobility;Balance;Proper use of DME OT goals addressed during session: ADL's and self-care;Proper use of Adaptive equipment and DME       AM-PAC PT "6 Clicks" Mobility  Outcome Measure Help needed turning from  your back to your side while in a flat bed without using bedrails?: A Little Help needed moving from lying on your back to sitting on the side of a flat bed without using bedrails?: A Little Help needed moving to and from a bed to a chair (including a wheelchair)?: A Little Help needed standing up from a chair using your arms (e.g., wheelchair or bedside chair)?: A Little Help needed to walk in hospital room?: A Lot Help needed climbing 3-5 steps with a railing? : Total 6 Click Score: 15    End of Session Equipment Utilized During Treatment: Gait belt Activity Tolerance: Patient tolerated treatment well Patient left: in chair;with call bell/phone within reach;with chair alarm set Nurse Communication: Mobility status PT Visit Diagnosis: Unsteadiness on feet (R26.81);Pain;Difficulty in walking, not elsewhere classified (R26.2) Pain - Right/Left: Left Pain - part of body: Hip(groin)    Time: 4287-6811 PT Time Calculation (min) (ACUTE ONLY): 29 min   Charges:   PT Evaluation $PT Eval Moderate Complexity: 1 Mod          [name], PT, DPT Acute Rehabilitation Services Pager: [phone] Office: [phone]   [name] 10/19/2018, 3:24 PM

## 2018-10-19 NOTE — Progress Notes (Signed)
Patient arrived from [place] oriented Admitting doctor paged & notified of patient's arrival, tele monitor placed & verified on patient  Safety measures initiated  Vital signs stable  Will continue to monitor   Beryle Flock, RN

## 2018-10-19 NOTE — Evaluation (Signed)
Speech Language Pathology Evaluation Patient Details Name: Genia Perin MRN: [id number] DOB: 12/18/1935 Today's Date: 10/19/2018 Time: 3235-5732 SLP Time Calculation (min) (ACUTE ONLY): 23 min  Problem List:  Patient Active Problem List   Diagnosis Date Noted  . Closed fracture of left superior pubic ramus, initial encounter ([place]) 10/18/2018  . Dehydration 08/18/2018  . Dizziness 07/09/2018  . Stroke due to embolism of carotid artery ([place]) 06/05/2018  . Stroke-like symptom   . Dyspnea   . Acute blood loss anemia   . Acute ischemic stroke ([place]) 05/31/2018  . Stroke ([place]) 05/30/2018  . Chronic diastolic congestive heart failure ([place])   . Anemia of chronic disease   . Hypoalbuminemia due to protein-calorie malnutrition ([place])   . Labile blood pressure   . Atrial fibrillation ([place])   . Chronic neck pain   . Subcortical infarction ([place]) 04/27/2018  . CVA (cerebral vascular accident) ([place]) 04/26/2018  . TIA (transient ischemic attack) 04/24/2018  . CAD (coronary artery disease) 08/07/2016  . (HFpEF) heart failure with preserved ejection fraction ([place]) 08/07/2016  . Ischemic cardiomyopathy 08/07/2016  . Chest pain   . NSTEMI (non-ST elevated myocardial infarction) ([place])   . Status post coronary artery stent placement   . Abnormal stress test   . SOB (shortness of breath)   . Multifocal pneumonia 07/13/2016  . Elevated troponin 07/13/2016  . Leukocytosis   . Pacemaker 05/17/2014  . Complications, pacemaker cardiac, mechanical 12/31/2012  . Edema extremities 10/08/2012  . Persistent atrial fibrillation 10/08/2012  . Hypertension 10/08/2012  . Hypothyroidism 10/08/2012  . Neuropathy 10/08/2012  . Diabetes ([place]) 10/08/2012  . Depression 09/08/2012  . Hypokalemia 08/08/2012  . Anemia 08/08/2012  . Knee fracture, left 08/07/2012  . Recurrent falls 08/07/2012  . Syncope 08/07/2012  . SDH (subdural hematoma) ([place]) 08/07/2012   Past Medical History:  Past Medical History:   Diagnosis Date  . Anemia   . Anxiety   . Arthritis    "all over" (02/11/2018)  . Basal cell carcinoma    "left leg" (02/11/2018)  . Cervical spine fracture ([place]) 12/2017   "C1-2"  . CHF (congestive heart failure) ([place])   . Chronic bronchitis ([place])   . Chronic neck pain    "since I broke my neck 6-8 wk ago" (02/11/2018)  . Diabetic peripheral neuropathy ([place])   . Fibromyalgia   . Fracture of right humerus   . Generalized weakness   . Headache    "weekly" (02/11/2018)  . History of blood transfusion    "related to one of my femur surgeries" (02/11/2018)  . History of echocardiogram    Echo 8/18: EF 50-55, no RWMA, Gr 1 DD, calcified AV leaflets, MAC, trivial MR, mod LAE, PASP 37  . Hypertension   . Hypothyroidism   . Incontinence of urine   . Ischemic stroke ([place]) 2000   "lost part of the vision in my right eye" (02/11/2018)  . Major depression, chronic   . Myocardial infarction ([place]) 06/2016  . Pacemaker   . Recurrent falls   . Syncope and collapse   . Type 2 diabetes, diet controlled ([place])    Past Surgical History:  Past Surgical History:  Procedure Laterality Date  . ABDOMINAL HYSTERECTOMY    . ANKLE FRACTURE SURGERY Right   . BASAL CELL CARCINOMA EXCISION Left    "leg"  . CARDIAC CATHETERIZATION  ~ 2014  . CARDIOVERSION N/A 02/13/2018   Procedure: CARDIOVERSION;  Surgeon: Sueanne Margarita, MD;  Location: [place];  Service: Cardiovascular;  Laterality: N/A;  . CARDIOVERSION N/A 03/27/2018   Procedure: CARDIOVERSION;  Surgeon: Lelon Perla, MD;  Location: [facility] ENDOSCOPY;  Service: Cardiovascular;  Laterality: N/A;  . CARPAL TUNNEL RELEASE     bilateral  . CATARACT EXTRACTION W/ INTRAOCULAR LENS  IMPLANT, BILATERAL Bilateral   . CHOLECYSTECTOMY OPEN    . COLONOSCOPY    . CORONARY ANGIOPLASTY WITH STENT PLACEMENT  06/2016  . CORONARY STENT INTERVENTION N/A 07/19/2016   Procedure: Coronary Stent Intervention;  Surgeon: Peter M Martinique, MD;  Location: [place] CV LAB;  Service: Cardiovascular;  Laterality: N/A;  . ENDARTERECTOMY Right 06/03/2018   Procedure: ENDARTERECTOMY CAROTID;  Surgeon: Rosetta Posner, MD;  Location: [place];  Service: Vascular;  Laterality: Right;  . EYE SURGERY     right eye catarace/lens implant  . FEMUR FRACTURE SURGERY Bilateral   . FRACTURE SURGERY    . GASTRIC BYPASS    . INSERT / REPLACE / REMOVE PACEMAKER  ~ 2012  . LEAD REVISION N/A 01/01/2013   Procedure: LEAD REVISION;  Surgeon: Evans Lance, MD;  Location: [facility] CATH LAB;  Service: Cardiovascular;  Laterality: N/A;  . LEFT HEART CATH AND CORONARY ANGIOGRAPHY N/A 07/19/2016   Procedure: Left Heart Cath and Coronary Angiography;  Surgeon: Peter M Martinique, MD;  Location: [place] CV LAB;  Service: Cardiovascular;  Laterality: N/A;  . OVARIAN CYST SURGERY     "one side only"  . PATCH ANGIOPLASTY Right 06/03/2018   Procedure: PATCH ANGIOPLASTY USING HEMASHIELD PLATINUM FINESSE;  Surgeon: Rosetta Posner, MD;  Location: [place];  Service: Vascular;  Laterality: Right;  . TEE WITHOUT CARDIOVERSION N/A 02/13/2018   Procedure: TRANSESOPHAGEAL ECHOCARDIOGRAM (TEE);  Surgeon: Sueanne Margarita, MD;  Location: [facility] ENDOSCOPY;  Service: Cardiovascular;  Laterality: N/A;  . TONSILLECTOMY     HPI:  83 y.o. female with a history of likely previous stroke in March with some persistent left-sided weakness per the patient.  She was evaluated again in May with stuttering speech, and this was of less clear etiology.  Patient reports, however that she has had some left arm weakness since that time and that her left arm weakness is at baseline.  Few days ago, she was turning her walker and felt something pop in her hip and has been having significant pain in problems with her legs since then. X-Ray revealed L superior pubic ramus fracture   Assessment / Plan / Recommendation Clinical Impression  Pt scored a 27/30 on the MOCA (alternate version), which is minimally changed from previous  testing (28/30 on primary version) and within functional limits for score results. Her speech is clear and her language is appropriate, although she does have some halting and stuttering qualities to her speech. She says that this is similar to what has happened in her previous episodes, and it has spontaneously resolved each time with additional time. CT Head was negative for acute findings. Pt does not need acute SLP at this time, but could benefit from SLP f/u at SNF showed speech changes persist.    SLP Assessment  SLP Recommendation/Assessment: All further Speech Lanaguage Pathology  needs can be addressed in the next venue of care SLP Visit Diagnosis: Dysarthria and anarthria (R47.1)    Follow Up Recommendations  Skilled Nursing facility    Frequency and Duration           SLP Evaluation Cognition  Overall Cognitive Status: Within Functional Limits for tasks assessed       Comprehension  Auditory Comprehension Overall Auditory Comprehension: Appears within functional limits for tasks assessed    Expression Expression Primary Mode of Expression: Verbal Verbal Expression Overall Verbal Expression: Appears within functional limits for tasks assessed Written Expression Dominant Hand: Right   Oral / Motor  Motor Speech Overall Motor Speech: Impaired(slow, stuttering speech) Respiration: Within functional limits Phonation: Normal Resonance: Within functional limits Articulation: Within functional limitis   GO                    Venita Sheffield Shyleigh Daughtry 10/19/2018, 4:30 PM  Pollyann Glen, M.A. [place] Acute Environmental education officer [phone] Office [phone]

## 2018-10-19 NOTE — ED Notes (Signed)
ED TO INPATIENT HANDOFF REPORT  ED Nurse Name and Phone #: Ailene Ravel [phone]  S Name/Age/Gender Meredith Gonzalez 83 y.o. female Room/Bed: MH01/MH01  Code Status   Code Status: Prior  Home/SNF/Other  Unsure; currently lives home by herself with caregiver in the home 6 hours per day  Triage Complete: Triage complete  Chief Complaint Slurred Speech, Balance Issues, Weakness  Triage Note Son noticed that her speech was slurred when he spoke on the phone to her today. Yesterday around 1600, speech was clear.  Speech delayed but clear at present   Allergies Allergies  Allergen Reactions  . Valium [Diazepam] Anxiety    Makes patient hyper  . Robaxin [Methocarbamol] Other (See Comments)    Makes pt loopy/acts drunk  . Tramadol Other (See Comments)    sedation  . Codeine Hives and Itching    Can take with Benadryl   . Darifenacin Itching    Can take with Benadryl  . Darvon [Propoxyphene Hcl] Itching    Can take with Benadryl  . Daypro [Oxaprozin] Itching    Can take with Benadryl  . Enablex [Darifenacin Hydrobromide Er] Itching    Can take with Benadryl  . Oxycodone Itching    Can take with Benadryl   . Propoxyphene Itching    Can take with Benadryl  . Risperdal [Risperidone] Itching    Can take with Benadryl  . Talwin [Pentazocine] Itching    Can take with Benadryl  . Vicodin [Hydrocodone-Acetaminophen] Itching    Can take with Benadryl     Level of Care/Admitting Diagnosis ED Disposition    ED Disposition Condition [facility] Area: [place] [[id number]]  Level of Care: Telemetry Medical [104]  Covid Evaluation: Asymptomatic Screening Protocol (No Symptoms)  Diagnosis: Closed fracture of multiple pubic rami, left, initial encounter [facility]) [[id number]]  Admitting Physician: Shirlean Mylar  Attending Physician: Dana Allan I [[id number]]  Estimated length of stay: past midnight tomorrow  Certification:: I certify this  patient will need inpatient services for at least 2 midnights  PT Class (Do Not Modify): Inpatient [101]  PT Acc Code (Do Not Modify): Private [1]       B Medical/Surgery History Past Medical History:  Diagnosis Date  . Anemia   . Anxiety   . Arthritis    "all over" (02/11/2018)  . Basal cell carcinoma    "left leg" (02/11/2018)  . Cervical spine fracture ([place]) 12/2017   "C1-2"  . CHF (congestive heart failure) ([place])   . Chronic bronchitis ([place])   . Chronic neck pain    "since I broke my neck 6-8 wk ago" (02/11/2018)  . Diabetic peripheral neuropathy ([place])   . Fibromyalgia   . Fracture of right humerus   . Generalized weakness   . Headache    "weekly" (02/11/2018)  . History of blood transfusion    "related to one of my femur surgeries" (02/11/2018)  . History of echocardiogram    Echo 8/18: EF 50-55, no RWMA, Gr 1 DD, calcified AV leaflets, MAC, trivial MR, mod LAE, PASP 37  . Hypertension   . Hypothyroidism   . Incontinence of urine   . Ischemic stroke ([place]) 2000   "lost part of the vision in my right eye" (02/11/2018)  . Major depression, chronic   . Myocardial infarction ([place]) 06/2016  . Pacemaker   . Recurrent falls   . Syncope and collapse   . Type 2 diabetes, diet controlled ([place])  Past Surgical History:  Procedure Laterality Date  . ABDOMINAL HYSTERECTOMY    . ANKLE FRACTURE SURGERY Right   . BASAL CELL CARCINOMA EXCISION Left    "leg"  . CARDIAC CATHETERIZATION  ~ 2014  . CARDIOVERSION N/A 02/13/2018   Procedure: CARDIOVERSION;  Surgeon: Sueanne Margarita, MD;  Location: [facility] ENDOSCOPY;  Service: Cardiovascular;  Laterality: N/A;  . CARDIOVERSION N/A 03/27/2018   Procedure: CARDIOVERSION;  Surgeon: Lelon Perla, MD;  Location: [facility] ENDOSCOPY;  Service: Cardiovascular;  Laterality: N/A;  . CARPAL TUNNEL RELEASE     bilateral  . CATARACT EXTRACTION W/ INTRAOCULAR LENS  IMPLANT, BILATERAL Bilateral   . CHOLECYSTECTOMY OPEN    . COLONOSCOPY    .  CORONARY ANGIOPLASTY WITH STENT PLACEMENT  06/2016  . CORONARY STENT INTERVENTION N/A 07/19/2016   Procedure: Coronary Stent Intervention;  Surgeon: Peter M Martinique, MD;  Location: [place] CV LAB;  Service: Cardiovascular;  Laterality: N/A;  . ENDARTERECTOMY Right 06/03/2018   Procedure: ENDARTERECTOMY CAROTID;  Surgeon: Rosetta Posner, MD;  Location: [place];  Service: Vascular;  Laterality: Right;  . EYE SURGERY     right eye catarace/lens implant  . FEMUR FRACTURE SURGERY Bilateral   . FRACTURE SURGERY    . GASTRIC BYPASS    . INSERT / REPLACE / REMOVE PACEMAKER  ~ 2012  . LEAD REVISION N/A 01/01/2013   Procedure: LEAD REVISION;  Surgeon: Evans Lance, MD;  Location: [facility] CATH LAB;  Service: Cardiovascular;  Laterality: N/A;  . LEFT HEART CATH AND CORONARY ANGIOGRAPHY N/A 07/19/2016   Procedure: Left Heart Cath and Coronary Angiography;  Surgeon: Peter M Martinique, MD;  Location: [place] CV LAB;  Service: Cardiovascular;  Laterality: N/A;  . OVARIAN CYST SURGERY     "one side only"  . PATCH ANGIOPLASTY Right 06/03/2018   Procedure: PATCH ANGIOPLASTY USING HEMASHIELD PLATINUM FINESSE;  Surgeon: Rosetta Posner, MD;  Location: [place];  Service: Vascular;  Laterality: Right;  . TEE WITHOUT CARDIOVERSION N/A 02/13/2018   Procedure: TRANSESOPHAGEAL ECHOCARDIOGRAM (TEE);  Surgeon: Sueanne Margarita, MD;  Location: [facility] ENDOSCOPY;  Service: Cardiovascular;  Laterality: N/A;  . TONSILLECTOMY       A IV Location/Drains/Wounds Patient Lines/Drains/Airways Status   Active Line/Drains/Airways    Name:   Placement date:   Placement time:   Site:   Days:   Peripheral IV 10/18/18 Left Wrist   10/18/18    1757    Wrist   1          Intake/Output Last 24 hours No intake or output data in the 24 hours ending 10/19/18 0100  Labs/Imaging Results for orders placed or performed during the hospital encounter of 10/18/18 (from the past 48 hour(s))  CBC with Differential/Platelet     Status: Abnormal    Collection Time: 10/18/18  6:17 PM  Result Value Ref Range   WBC 6.3 4.0 - 10.5 K/uL   RBC 3.37 (L) 3.87 - 5.11 MIL/uL   Hemoglobin 11.1 (L) 12.0 - 15.0 g/dL   HCT 35.7 (L) 36.0 - 46.0 %   MCV 105.9 (H) 80.0 - 100.0 fL   MCH 32.9 26.0 - 34.0 pg   MCHC 31.1 30.0 - 36.0 g/dL   RDW 13.2 11.5 - 15.5 %   Platelets 195 150 - 400 K/uL   nRBC 0.0 0.0 - 0.2 %   Neutrophils Relative % 72 %   Neutro Abs 4.5 1.7 - 7.7 K/uL   Lymphocytes Relative 18 %  Lymphs Abs 1.1 0.7 - 4.0 K/uL   Monocytes Relative 9 %   Monocytes Absolute 0.6 0.1 - 1.0 K/uL   Eosinophils Relative 1 %   Eosinophils Absolute 0.0 0.0 - 0.5 K/uL   Basophils Relative 0 %   Basophils Absolute 0.0 0.0 - 0.1 K/uL   Immature Granulocytes 0 %   Abs Immature Granulocytes 0.01 0.00 - 0.07 K/uL    Comment: Performed at [facility], [address] [place], [place] [address]  Comprehensive metabolic panel     Status: Abnormal   Collection Time: 10/18/18  6:17 PM  Result Value Ref Range   Sodium 141 135 - 145 mmol/L   Potassium 4.1 3.5 - 5.1 mmol/L   Chloride 112 (H) 98 - 111 mmol/L   CO2 22 22 - 32 mmol/L   Glucose, Bld 101 (H) 70 - 99 mg/dL   BUN 26 (H) 8 - 23 mg/dL   Creatinine, Ser 0.74 0.44 - 1.00 mg/dL   Calcium 8.7 (L) 8.9 - 10.3 mg/dL   Total Protein 6.6 6.5 - 8.1 g/dL   Albumin 3.3 (L) 3.5 - 5.0 g/dL   AST 28 15 - 41 U/L   ALT 24 0 - 44 U/L   Alkaline Phosphatase 94 38 - 126 U/L   Total Bilirubin 0.6 0.3 - 1.2 mg/dL   GFR calc non Af Amer >60 >60 mL/min   GFR calc Af Amer >60 >60 mL/min   Anion gap 7 5 - 15    Comment: Performed at [facility], [address] [place], [place] [address]  Protime-INR     Status: None   Collection Time: 10/18/18  6:17 PM  Result Value Ref Range   Prothrombin Time 14.8 11.4 - 15.2 seconds   INR 1.2 0.8 - 1.2    Comment: (NOTE) INR goal varies based on device and disease states. Performed at [facility], [address] [place], [place]  [address]   APTT     Status: Abnormal   Collection Time: 10/18/18  6:17 PM  Result Value Ref Range   aPTT 40 (H) 24 - 36 seconds    Comment:        IF BASELINE aPTT IS ELEVATED, SUGGEST PATIENT RISK ASSESSMENT BE USED TO DETERMINE APPROPRIATE ANTICOAGULANT THERAPY. Performed at [facility], [address] [place], [place] [address]   Urinalysis, Routine w reflex microscopic     Status: None   Collection Time: 10/18/18  7:33 PM  Result Value Ref Range   Color, Urine YELLOW YELLOW   APPearance CLEAR CLEAR   Specific Gravity, Urine 1.025 1.005 - 1.030   pH 6.0 5.0 - 8.0   Glucose, UA NEGATIVE NEGATIVE mg/dL   Hgb urine dipstick NEGATIVE NEGATIVE   Bilirubin Urine NEGATIVE NEGATIVE   Ketones, ur NEGATIVE NEGATIVE mg/dL   Protein, ur NEGATIVE NEGATIVE mg/dL   Nitrite NEGATIVE NEGATIVE   Leukocytes,Ua NEGATIVE NEGATIVE    Comment: Microscopic not done on urines with negative protein, blood, leukocytes, nitrite, or glucose < 500 mg/dL. Performed at [facility], [address] [place], [place] [address]   SARS Coronavirus 2 (Performed in [facility] hospital lab)     Status: None   Collection Time: 10/18/18 10:53 PM   Specimen: Nasopharyngeal Swab  Result Value Ref Range   SARS Coronavirus 2 NEGATIVE NEGATIVE    Comment: (NOTE) If result is NEGATIVE SARS-CoV-2 target nucleic acids are  NOT DETECTED. The SARS-CoV-2 RNA is generally detectable in upper and lower  respiratory specimens during the acute phase of infection. The lowest  concentration of SARS-CoV-2 viral copies this assay can detect is 250  copies / mL. A negative result does not preclude SARS-CoV-2 infection  and should not be used as the sole basis for treatment or other  patient management decisions.  A negative result may occur with  improper specimen collection / handling, submission of specimen other  than nasopharyngeal swab, presence of viral mutation(s) within the  areas targeted by this  assay, and inadequate number of viral copies  (<250 copies / mL). A negative result must be combined with clinical  observations, patient history, and epidemiological information. If result is POSITIVE SARS-CoV-2 target nucleic acids are DETECTED. The SARS-CoV-2 RNA is generally detectable in upper and lower  respiratory specimens dur ing the acute phase of infection.  Positive  results are indicative of active infection with SARS-CoV-2.  Clinical  correlation with patient history and other diagnostic information is  necessary to determine patient infection status.  Positive results do  not rule out bacterial infection or co-infection with other viruses. If result is PRESUMPTIVE POSTIVE SARS-CoV-2 nucleic acids MAY BE PRESENT.   A presumptive positive result was obtained on the submitted specimen  and confirmed on repeat testing.  While 2019 novel coronavirus  (SARS-CoV-2) nucleic acids may be present in the submitted sample  additional confirmatory testing may be necessary for epidemiological  and / or clinical management purposes  to differentiate between  SARS-CoV-2 and other Sarbecovirus currently known to infect humans.  If clinically indicated additional testing with an alternate test  methodology [phone]) is advised. The SARS-CoV-2 RNA is generally  detectable in upper and lower respiratory sp ecimens during the acute  phase of infection. The expected result is Negative. Fact Sheet for Patients:  StrictlyIdeas.no Fact Sheet for Healthcare Providers: BankingDealers.co.za This test is not yet approved or cleared by the [place] FDA and has been authorized for detection and/or diagnosis of SARS-CoV-2 by FDA under an Emergency Use Authorization (EUA).  This EUA will remain in effect (meaning this test can be used) for the duration of the COVID-19 declaration under Section 564(b)(1) of the Act, 21 U.S.C. section 360bbb-3(b)(1),  unless the authorization is terminated or revoked sooner. Performed at [facility], [address] [place], [place] [address]    Ct Head Wo Contrast  Result Date: 10/18/2018 CLINICAL DATA:  Slurred speech EXAM: CT HEAD WITHOUT CONTRAST TECHNIQUE: Contiguous axial images were obtained from the base of the skull through the vertex without intravenous contrast. COMPARISON:  08/20/2018 FINDINGS: Brain: No evidence of acute infarction, hemorrhage, hydrocephalus, extra-axial collection or mass lesion/mass effect. Periventricular and deep white matter hypodensity. Vascular: No hyperdense vessel or unexpected calcification. Skull: Normal. Negative for fracture or focal lesion. Sinuses/Orbits: No acute finding. Other: None. IMPRESSION: No acute intracranial pathology. Small-vessel white matter disease. MRI may be used to more sensitively evaluate for acute diffusion restricting infarction if suspected. Electronically Signed   By: Eddie Candle M.D.   On: 10/18/2018 19:00   Dg Hip Unilat W Or Wo Pelvis 2-3 Views Left  Result Date: 10/18/2018 CLINICAL DATA:  Left hip pain x several days EXAM: DG HIP (WITH OR WITHOUT PELVIS) 2-3V LEFT COMPARISON:  06/27/2018 FINDINGS: Status post ORIF of an old intertrochanteric left hip fracture. No evidence of hardware complication. Suspected new nondisplaced fracture involving the left superior pubic ramus. Old left inferior  pubic ramus fracture deformity, superimposed acute fracture not discretely visualized but difficult to exclude. Status post ORIF of an old right hip fracture. No evidence of hardware complication. IMPRESSION: Acute nondisplaced left superior pubic ramus fracture. Old left inferior pubic ramus fracture deformity, acute superimposed fracture not discretely visualized but difficult to exclude. Status post ORIF of the bilateral hips, without evidence of hardware complication. Electronically Signed   By: Julian Hy M.D.   On: 10/18/2018 20:00     Pending Labs Unresulted Labs (From admission, onward)   None      Vitals/Pain Today's Vitals   10/18/18 2330 10/19/18 0000 10/19/18 0013 10/19/18 0030  BP: (!) 158/70 (!) 146/72  (!) 163/72  Pulse: 77 74  79  Resp: _0 Temp:      TempSrc:      SpO2: 95% 94%  94%  PainSc:   4      Isolation Precautions No active isolations  Medications Medications  acetaminophen (TYLENOL) tablet 650 mg (650 mg Oral Given 10/18/18 2225)    Mobility    Focused Assessments    R Recommendations: See Admitting Provider Note  Report given to:   Additional Notes:

## 2018-10-20 ENCOUNTER — Inpatient Hospital Stay (HOSPITAL_COMMUNITY): Payer: Medicare Other

## 2018-10-20 MED ORDER — SODIUM CHLORIDE 0.9 % IV BOLUS
500.0000 mL | Freq: Once | INTRAVENOUS | Status: AC
  Administered 2018-10-20: 500 mL via INTRAVENOUS

## 2018-10-20 MED ORDER — HYDROCODONE-ACETAMINOPHEN 5-325 MG PO TABS
1.0000 | ORAL_TABLET | Freq: Four times a day (QID) | ORAL | Status: DC | PRN
  Administered 2018-10-21 – 2018-10-22 (×2): 1 via ORAL
  Filled 2018-10-20 (×2): qty 1

## 2018-10-20 NOTE — NC FL2 (Signed)
[place] LEVEL OF CARE SCREENING TOOL     IDENTIFICATION  Patient Name: Meredith Gonzalez Birthdate: 03/05/1936 Sex: female Admission Date (Current Location): 10/18/2018  [facility] and [place] Number:  Herbalist and Address:  [facility] [place], [place] [address], [place], [place] [address]      Provider Number: [id number]  Attending Physician Name and Address:  Karie Kirks, DO  Relative Name and Phone Number:       Current Level of Care: Hospital Recommended Level of Care: [place] Prior Approval Number:    Date Approved/Denied:   PASRR Number: [id number]  Discharge Plan: SNF    Current Diagnoses: Patient Active Problem List   Diagnosis Date Noted  . Closed fracture of left superior pubic ramus, initial encounter ([place]) 10/18/2018  . Dehydration 08/18/2018  . Dizziness 07/09/2018  . Stroke due to embolism of carotid artery ([place]) 06/05/2018  . Stroke-like symptom   . Dyspnea   . Acute blood loss anemia   . Acute ischemic stroke ([place]) 05/31/2018  . Stroke ([place]) 05/30/2018  . Chronic diastolic congestive heart failure ([place])   . Anemia of chronic disease   . Hypoalbuminemia due to protein-calorie malnutrition ([place])   . Labile blood pressure   . Atrial fibrillation ([place])   . Chronic neck pain   . Subcortical infarction ([place]) 04/27/2018  . CVA (cerebral vascular accident) ([place]) 04/26/2018  . TIA (transient ischemic attack) 04/24/2018  . CAD (coronary artery disease) 08/07/2016  . (HFpEF) heart failure with preserved ejection fraction ([place]) 08/07/2016  . Ischemic cardiomyopathy 08/07/2016  . Chest pain   . NSTEMI (non-ST elevated myocardial infarction) ([place])   . Status post coronary artery stent placement   . Abnormal stress test   . SOB (shortness of breath)   . Multifocal pneumonia 07/13/2016  . Elevated troponin 07/13/2016  . Leukocytosis   . Pacemaker 05/17/2014  . Complications, pacemaker cardiac,  mechanical 12/31/2012  . Edema extremities 10/08/2012  . Persistent atrial fibrillation 10/08/2012  . Hypertension 10/08/2012  . Hypothyroidism 10/08/2012  . Neuropathy 10/08/2012  . Diabetes ([place]) 10/08/2012  . Depression 09/08/2012  . Hypokalemia 08/08/2012  . Anemia 08/08/2012  . Knee fracture, left 08/07/2012  . Recurrent falls 08/07/2012  . Syncope 08/07/2012  . SDH (subdural hematoma) ([place]) 08/07/2012    Orientation RESPIRATION BLADDER Height & Weight     Self, Time, Situation, Place  Normal Continent Weight: 116 lb 10 oz (52.9 kg) Height:  5\' 5"  (165.1 cm)  BEHAVIORAL SYMPTOMS/MOOD NEUROLOGICAL BOWEL NUTRITION STATUS      Continent Diet(heart healthy)  AMBULATORY STATUS COMMUNICATION OF NEEDS Skin   Limited Assist Verbally Normal                       Personal Care Assistance Level of Assistance  Bathing, Feeding, Dressing Bathing Assistance: Limited assistance Feeding assistance: Limited assistance Dressing Assistance: Limited assistance     Functional Limitations Info  Sight, Speech, Hearing Sight Info: Adequate Hearing Info: Adequate Speech Info: Adequate    SPECIAL CARE FACTORS FREQUENCY  PT (By licensed PT), OT (By licensed OT)     PT Frequency: 5x/wk OT Frequency: 5x/wk            Contractures Contractures Info: Not present    Additional Factors Info  Code Status, Allergies, Psychotropic Code Status Info: DNR Allergies Info: Valium Diazepam, Robaxin Methocarbamol, Tramadol, Codeine, Darifenacin, Darvon Propoxyphene Hcl, Daypro Oxaprozin, Enablex Darifenacin Hydrobromide Er, Oxycodone, Propoxyphene,  Risperdal Risperidone, Talwin Pentazocine, Vicodin Hydrocodone-acetaminophen Psychotropic Info: Zoloft 25mg  daily         Current Medications (10/20/2018):  This is the current hospital active medication list Current Facility-Administered Medications  Medication Dose Route Frequency Provider Last Rate Last Dose  . acetaminophen (TYLENOL)  tablet 650 mg  650 mg Oral Q4H PRN Etta Quill, DO   650 mg at 10/20/18 1215   Or  . acetaminophen (TYLENOL) solution 650 mg  650 mg Per Tube Q4H PRN Etta Quill, DO       Or  . acetaminophen (TYLENOL) suppository 650 mg  650 mg Rectal Q4H PRN Etta Quill, DO      . albuterol (PROVENTIL) (2.5 MG/3ML) 0.083% nebulizer solution 2.5 mg  2.5 mg Nebulization Q6H PRN Etta Quill, DO      . amiodarone (PACERONE) tablet 200 mg  200 mg Oral [place] Lone, Jared M, DO   200 mg at 10/20/18 7673  . apixaban (ELIQUIS) tablet 2.5 mg  2.5 mg Oral BID Jennette Kettle M, DO   2.5 mg at 10/20/18 4193  . atorvastatin (LIPITOR) tablet 20 mg  20 mg Oral Daily Jennette Kettle M, DO   20 mg at 10/20/18 0951  . Chlorhexidine Gluconate Cloth 2 % PADS 6 each  6 each Topical Q0600 Swayze, Ava, DO   6 each at 10/20/18 0609  . diclofenac sodium (VOLTAREN) 1 % transdermal gel 2 g  2 g Topical QID Jennette Kettle M, DO   2 g at 10/19/18 1700  . diphenhydrAMINE (BENADRYL) capsule 25 mg  25 mg Oral Q6H PRN Etta Quill, DO   25 mg at 10/20/18 0951  . docusate sodium (COLACE) capsule 100 mg  100 mg Oral Daily Jennette Kettle M, DO   100 mg at 10/19/18 1029  . feeding supplement (ENSURE ENLIVE) (ENSURE ENLIVE) liquid 237 mL  237 mL Oral BID BM Jennette Kettle M, DO   237 mL at 10/19/18 1030  . ferrous sulfate tablet 325 mg  325 mg Oral BID WC Etta Quill, DO   325 mg at 10/20/18 7902  . gabapentin (NEURONTIN) capsule 300 mg  300 mg Oral q morning - 10a Etta Quill, DO   300 mg at 10/20/18 0950  . gabapentin (NEURONTIN) capsule 600 mg  600 mg Oral QHS Einar Grad, [facility]      . HYDROcodone-acetaminophen (NORCO/VICODIN) 5-325 MG per tablet 1 tablet  1 tablet Oral Q6H PRN Swayze, Ava, DO      . levothyroxine (SYNTHROID) tablet 75 mcg  75 mcg Oral Q0600 Etta Quill, DO   75 mcg at 10/20/18 0608  . LORazepam (ATIVAN) tablet 0.25 mg  0.25 mg Oral Q6H PRN Etta Quill, DO      .  mometasone-formoterol [facility]) 200-5 MCG/ACT inhaler 2 puff  2 puff Inhalation BID Etta Quill, DO   2 puff at 10/20/18 0815  . mupirocin ointment (BACTROBAN) 2 % 1 application  1 application Nasal BID Swayze, Ava, DO   1 application at 40/97/35 0955  . pantoprazole (PROTONIX) EC tablet 40 mg  40 mg Oral Daily Jennette Kettle M, DO   40 mg at 10/20/18 3299  . sertraline (ZOLOFT) tablet 25 mg  25 mg Oral Daily Jennette Kettle M, DO   25 mg at 10/20/18 2426  . sodium chloride 0.9 % bolus 500 mL  500 mL Intravenous Once Swayze, Ava, DO      . tamsulosin (FLOMAX) capsule  0.4 mg  0.4 mg Oral Daily Jennette Kettle M, DO   0.4 mg at 10/20/18 3612     Discharge Medications: Please see discharge summary for a list of discharge medications.  Relevant Imaging Results:  Relevant Lab Results:   Additional Information SS#: [id number]  Geralynn Ochs, LCSW

## 2018-10-20 NOTE — Progress Notes (Signed)
PT Cancellation Note  Patient Details Name: Meredith Gonzalez MRN: [id number] DOB: 04-29-1935   Cancelled Treatment:    Reason Eval/Treat Not Completed: Patient at procedure or test/unavailable. Will follow-up as time allows.    [name] 10/20/2018, 2:09 PM   [name], PT, DPT Acute Rehabilitation Services Pager: [phone] Office: [phone]

## 2018-10-20 NOTE — Progress Notes (Addendum)
PROGRESS NOTE  Meredith Gonzalez VOZ:[id number] DOB: 1936/02/03 DOA: 10/18/2018 PCP: Sinclair Ship, MD  Brief History    Meredith Gonzalez is a 83 y.o. female with medical history significant of A.Fib on eliquis, prior stroke and multiple stroke work ups with stroke like symptoms so far this year.   Patient called son around 4pm today because she was having difficulty speaking.  Described as slurring words and difficulty getting her words out.  She was also having difficulty ambulating due to feeling off ballance as well as L groin pain which is severe and has been present for the past several days.  She thinks she may have twisted her groin the wrong way getting out of chair but doesn't mention a specific fall event.   ED Course: L superior pubic ramus fracture.  Her slurred speech has improved but she still has some difficulty getting her words out it seems.  She also has some L sided weakness which is new she reports.   CT head neg.  Patient transferred to [facility]. Neurology was consulted. She is on the stroke pathway. Lt arm weakness appears to be chronic. 2D echo and carotid dopplers are pending. Repeat CT brain will be performed tomorrow. The patient is unable to have MRI due to PPM. PT/OT/SLP have been consulted and will evaluate the patient. She will be continued on eliquis which she takes for atrial fibrillation.   Consultants  . Neurology  Procedures  . None  Antibiotics   Anti-infectives (From admission, onward)   None      Subjective  The patient is resting comfortably. Patient had a low blood pressure this morning. Bolus ordered.  Objective   Vitals:  Vitals:   10/20/18 0949 10/20/18 1237  BP: (!) 141/65 (!) 91/41  Pulse: 80 79  Resp: 16 15  Temp: 98 F (36.7 C) 98.1 F (36.7 C)  SpO2: 98% 100%    Exam:  Constitutional:  . The patient is awake, alert, and oriented x 3. No acute distress. Respiratory:  . There is no increased work of breathing. . No wheezes, rales, or  rhonchi- . No tactile fremitus Cardiovascular:  . Regular rate and rhythm . No murmurs, ectopy, or gallups. . No lateral PMI. No thrills.  Abdomen:  . Abdomen is soft, non-tender, non-distended . No hernias, masses, or organomegaly. . Normoactive bowel sounds.  Musculoskeletal:  . No cyanosis, clubbing, or edema Skin:  . No rashes, lesions, ulcers . palpation of skin: no induration or nodules Neurologic:  . CN 2-12 intact . Weakness left upper and lower extremity ? Patient states is chronic. Psychiatric:  . Mental status  Mood, affect appropriate  Orientation to person, place, time  . judgment and insight appear intact    I have personally reviewed the following:   Today's Data  . Vitals, lipid panels  Imaging  . Right Carotid: 1-39% stenosis, CEA remains patent. . Left Carotid: 1-39 stenosis. No change since 05/31/2018 . Vertebrals: B/L vertebral arteries demonstrate antegrade flow.  . Subclavians: Normal flow hemodynamics bilaterally.  Cardiology Data  . EF 60-65% . Impaired relaxation  Scheduled Meds: . amiodarone  200 mg Oral BH-q7a  . apixaban  2.5 mg Oral BID  . atorvastatin  20 mg Oral Daily  . Chlorhexidine Gluconate Cloth  6 each Topical Q0600  . diclofenac sodium  2 g Topical QID  . docusate sodium  100 mg Oral Daily  . feeding supplement (ENSURE ENLIVE)  237 mL Oral BID BM  . ferrous sulfate  325 mg Oral BID WC  . gabapentin  300 mg Oral q morning - 10a  . gabapentin  600 mg Oral QHS  . levothyroxine  75 mcg Oral Q0600  . mometasone-formoterol  2 puff Inhalation BID  . mupirocin ointment  1 application Nasal BID  . pantoprazole  40 mg Oral Daily  . sertraline  25 mg Oral Daily  . tamsulosin  0.4 mg Oral Daily   Continuous Infusions: . sodium chloride      Principal Problem:   Closed fracture of left superior pubic ramus, initial encounter (HCC) Active Problems:   Atrial fibrillation (HCC)   Chronic diastolic congestive heart failure (HCC)    Stroke-like symptom   LOS: 2 days   A & P  L superior pubic ramus fx : PT/OT has been consulted. Pain control with norco PRN.  Stroke like symptoms: The patient now states that left upper extremity weakness is chronic. Her aphasia has improved. Unclear if weakness in LLE is due to pelvic fracture or due to neurological deficits. Patient will have a repeat CT tomorrow to better evaluate. Dr. Leonel Ramsay doubts that this represents a new stroke today. Continue statin and eliquis.  Hypotension: 500 cc bolus ordered. Monitor blood pressures. Monitor.  Atrial fibrillation (Type: Clinically undetermined): Continue eliquis and amiodarone.   I have seen and examined this patient myself. I have spent 30 minutes in her evaluation and care.   DVT prophylaxis: Eliquis Code Status: Full Family Communication: I have discussed the patient with her son at length. All questions answered to the best of my ability. Disposition Plan: TBD  Tery Hoeger, DO [facility] Direct contact: see [email or web address]  7PM-7AM contact night coverage as above 10/20/2018, 2:19 PM  LOS: 1 day

## 2018-10-20 NOTE — TOC Initial Note (Signed)
Transition of Care [facility]) - Initial/Assessment Note    Patient Details  Name: Meredith Gonzalez MRN: [id number] Date of Birth: 11/03/1935  Transition of [facility]) CM/SW Contact:    Geralynn Ochs, LCSW Phone Number: 10/20/2018, 3:09 PM  Clinical Narrative:   CSW met with patient to discuss recommendation for SNF. Patient sighed, but agreed except she doesn't want to go back to [facility]. Patient hopeful that [facility] will have a bed this time. CSW completed referral and sent to [facility]. Patient complaining of a really bad headache, CSW alerted RN. CSW to follow.                Expected Discharge Plan: Skilled Nursing Facility Barriers to Discharge: Continued Medical Work up, Ship broker   Patient Goals and CMS Choice Patient states their goals for this hospitalization and ongoing recovery are:: to feel better CMS Medicare.gov Compare Post Acute Care list provided to:: Patient Choice offered to / list presented to : Patient  Expected Discharge Plan and Services Expected Discharge Plan: [place] Choice: [place] Living arrangements for the past 2 months: Single Family Home, [place]                                      Prior Living Arrangements/Services Living arrangements for the past 2 months: [place], [place] Lives with:: Self Patient language and need for interpreter reviewed:: No Do you feel safe going back to the place where you live?: Yes      Need for Family Participation in Patient Care: No (Comment) Care giver support system in place?: No (comment) Current home services: DME, Home OT Criminal Activity/Legal Involvement Pertinent to Current Situation/Hospitalization: No - Comment as needed  Activities of Daily Living Home Assistive Devices/Equipment: Dentures (specify type)(upper & lower) ADL Screening (condition at time of  admission) Patient's cognitive ability adequate to safely complete daily activities?: Yes Is the patient deaf or have difficulty hearing?: No Does the patient have difficulty seeing, even when wearing glasses/contacts?: Yes Does the patient have difficulty concentrating, remembering, or making decisions?: No Patient able to express need for assistance with ADLs?: Yes Does the patient have difficulty dressing or bathing?: Yes Independently performs ADLs?: No Does the patient have difficulty walking or climbing stairs?: Yes Weakness of Legs: Both Weakness of Arms/Hands: Both  Permission Sought/Granted Permission sought to share information with : Facility Sport and exercise psychologist, Family Supports Permission granted to share information with : Yes, Verbal Permission Granted  Share Information with NAME: Lennette Bihari  Permission granted to share info w AGENCY: [facility]  Permission granted to share info w Relationship: Son     Emotional Assessment Appearance:: Appears stated age Attitude/Demeanor/Rapport: Engaged Affect (typically observed): Appropriate Orientation: : Oriented to Self, Oriented to Place, Oriented to  Time, Oriented to Situation Alcohol / Substance Use: Not Applicable Psych Involvement: No (comment)  Admission diagnosis:  TIA (transient ischemic attack) [G45.9] Closed fracture of superior ramus of left pubis, initial encounter [facility]) [S32.512A] Patient Active Problem List   Diagnosis Date Noted  . Closed fracture of left superior pubic ramus, initial encounter ([place]) 10/18/2018  . Dehydration 08/18/2018  . Dizziness 07/09/2018  . Stroke due to embolism of carotid artery ([place]) 06/05/2018  . Stroke-like symptom   . Dyspnea   . Acute blood loss anemia   . Acute ischemic stroke ([place]) 05/31/2018  .  Stroke ([place]) 05/30/2018  . Chronic diastolic congestive heart failure ([place])   . Anemia of chronic disease   . Hypoalbuminemia due to protein-calorie malnutrition ([place])   . Labile  blood pressure   . Atrial fibrillation ([place])   . Chronic neck pain   . Subcortical infarction ([place]) 04/27/2018  . CVA (cerebral vascular accident) ([place]) 04/26/2018  . TIA (transient ischemic attack) 04/24/2018  . CAD (coronary artery disease) 08/07/2016  . (HFpEF) heart failure with preserved ejection fraction ([place]) 08/07/2016  . Ischemic cardiomyopathy 08/07/2016  . Chest pain   . NSTEMI (non-ST elevated myocardial infarction) ([place])   . Status post coronary artery stent placement   . Abnormal stress test   . SOB (shortness of breath)   . Multifocal pneumonia 07/13/2016  . Elevated troponin 07/13/2016  . Leukocytosis   . Pacemaker 05/17/2014  . Complications, pacemaker cardiac, mechanical 12/31/2012  . Edema extremities 10/08/2012  . Persistent atrial fibrillation 10/08/2012  . Hypertension 10/08/2012  . Hypothyroidism 10/08/2012  . Neuropathy 10/08/2012  . Diabetes ([place]) 10/08/2012  . Depression 09/08/2012  . Hypokalemia 08/08/2012  . Anemia 08/08/2012  . Knee fracture, left 08/07/2012  . Recurrent falls 08/07/2012  . Syncope 08/07/2012  . SDH (subdural hematoma) ([place]) 08/07/2012   PCP:  Sinclair Ship, MD Pharmacy:   [facility], [place] [address] [address] Phone: [phone] Fax: [phone]  [id number] [facility] [address] [id number]: [phone]: [phone]    Social Determinants of Health (SDOH) Interventions    Readmission Risk Interventions Readmission Risk Prevention Plan 08/19/2018  Transportation Screening Complete  Medication Review (Press photographer Complete  PCP or Specialist appointment within 3-5 days of discharge Complete  HRI or Home Care Consult Complete  SW Recovery Care/Counseling Consult Complete  [place]  Some recent data might be hidden

## 2018-10-21 LAB — BASIC METABOLIC PANEL
Anion gap: 5 (ref 5–15)
BUN: 18 mg/dL (ref 8–23)
CO2: 24 mmol/L (ref 22–32)
Calcium: 8.4 mg/dL — ABNORMAL LOW (ref 8.9–10.3)
Chloride: 111 mmol/L (ref 98–111)
Creatinine, Ser: 0.63 mg/dL (ref 0.44–1.00)
GFR calc Af Amer: >60 mL/min (ref >60)
GFR calc non Af Amer: >60 mL/min (ref >60)
Glucose, Bld: 105 mg/dL — ABNORMAL HIGH (ref 70–99)
Potassium: 3.8 mmol/L (ref 3.5–5.1)
Sodium: 140 mmol/L (ref 135–145)

## 2018-10-21 LAB — CBC WITH DIFFERENTIAL/PLATELET
Abs Immature Granulocytes: 0.01 10*3/uL (ref 0.00–0.07)
Basophils Absolute: 0 10*3/uL (ref 0.0–0.1)
Basophils Relative: 0 %
Eosinophils Absolute: 0.1 10*3/uL (ref 0.0–0.5)
Eosinophils Relative: 1 %
HCT: 32.4 % — ABNORMAL LOW (ref 36.0–46.0)
Hemoglobin: 10.3 g/dL — ABNORMAL LOW (ref 12.0–15.0)
Immature Granulocytes: 0 %
Lymphocytes Relative: 20 %
Lymphs Abs: 0.9 10*3/uL (ref 0.7–4.0)
MCH: 33.2 pg (ref 26.0–34.0)
MCHC: 31.8 g/dL (ref 30.0–36.0)
MCV: 104.5 fL — ABNORMAL HIGH (ref 80.0–100.0)
Monocytes Absolute: 0.4 10*3/uL (ref 0.1–1.0)
Monocytes Relative: 10 %
Neutro Abs: 3.2 10*3/uL (ref 1.7–7.7)
Neutrophils Relative %: 69 %
Platelets: 175 10*3/uL (ref 150–400)
RBC: 3.1 MIL/uL — ABNORMAL LOW (ref 3.87–5.11)
RDW: 13.2 % (ref 11.5–15.5)
WBC: 4.6 10*3/uL (ref 4.0–10.5)
nRBC: 0 % (ref 0.0–0.2)

## 2018-10-21 MED ORDER — TRAMADOL HCL 50 MG PO TABS
50.0000 mg | ORAL_TABLET | Freq: Once | ORAL | Status: AC
  Administered 2018-10-21: 17:00:00 50 mg via ORAL
  Filled 2018-10-21: qty 1

## 2018-10-21 NOTE — Care Management Important Message (Signed)
Important Message  Patient Details  Name: Meredith Gonzalez MRN: [id number] Date of Birth: Jul 02, 1935   Medicare Important Message Given:  Yes     Fisher Hargadon 10/21/2018, 3:03 PM

## 2018-10-21 NOTE — Progress Notes (Signed)
Physical Therapy Treatment Patient Details Name: Meredith Gonzalez MRN: [id number] DOB: 1935/09/26 Today's Date: 10/21/2018    History of Present Illness 83 y.o. female with a history of likely previous stroke in March with some persistent left-sided weakness per the patient.  She was evaluated again in May with stuttering speech, and this was of less clear etiology.  Patient reports, however that she has had some left arm weakness since that time and that her left arm weakness is at baseline.  Few days ago, she was turning her walker and felt something pop in her hip and has been having significant pain in problems with her legs since then. X-Ray revealed L superior pubic ramus fracture    PT Comments    Patient seen for mobility progression. Pt is making gradual progress toward PT goals and tolerated short distance gait training in room. Pt is limited by pain while mobilizing and c/o dizziness/headache. Continue to progress as tolerated with anticipated d/c to SNF for further skilled PT services.     Follow Up Recommendations  SNF;Supervision/Assistance - 24 hour     Equipment Recommendations  None recommended by PT    Recommendations for Other Services       Precautions / Restrictions Precautions Precautions: Fall Restrictions Weight Bearing Restrictions: Yes LLE Weight Bearing: Weight bearing as tolerated    Mobility  Bed Mobility Overal bed mobility: Needs Assistance Bed Mobility: Supine to Sit     Supine to sit: Min guard     General bed mobility comments: min guard for safety; increased time and effort needed   Transfers Overall transfer level: Needs assistance Equipment used: Rolling walker (2 wheeled) Transfers: Sit to/from Stand Sit to Stand: Min assist         General transfer comment: cues for safe hand placement; assist to power up into standing   Ambulation/Gait Ambulation/Gait assistance: Min assist;+2 safety/equipment;Mod assist Gait Distance (Feet):  12 Feet Assistive device: Rolling walker (2 wheeled) Gait Pattern/deviations: Step-through pattern;Decreased step length - right;Decreased step length - left;Trunk flexed;Wide base of support Gait velocity: decreased   General Gait Details: cues for maintaining safe proximity to RW and for posture   Stairs             Wheelchair Mobility    Modified Rankin (Stroke Patients Only)       Balance Overall balance assessment: Needs assistance Sitting-balance support: Feet supported Sitting balance-Leahy Scale: Good     Standing balance support: During functional activity;Bilateral upper extremity supported Standing balance-Leahy Scale: Poor                              Cognition Arousal/Alertness: Awake/alert Behavior During Therapy: WFL for tasks assessed/performed Overall Cognitive Status: Within Functional Limits for tasks assessed                                        Exercises      General Comments General comments (skin integrity, edema, etc.): pt with c/o dizziness with postural changes but reports it subsides with time in new position; pt with headache end of session and BP 106/54 (69) in sitting       Pertinent Vitals/Pain Pain Assessment: 0-10 Pain Score: 7  Pain Location: pelvis, L groin with mobility (0 at rest beginning of session) Pain Descriptors / Indicators: Guarding;Grimacing;Sore Pain Intervention(s): Limited activity within patient's  tolerance;Monitored during session;Repositioned;Premedicated before session    Home Living                      Prior Function            PT Goals (current goals can now be found in the care plan section) Acute Rehab PT Goals Patient Stated Goal: to get better Progress towards PT goals: Progressing toward goals    Frequency    Min 3X/week      PT Plan Current plan remains appropriate    Co-evaluation              AM-PAC PT "6 Clicks" Mobility   Outcome  Measure  Help needed turning from your back to your side while in a flat bed without using bedrails?: A Little Help needed moving from lying on your back to sitting on the side of a flat bed without using bedrails?: A Little Help needed moving to and from a bed to a chair (including a wheelchair)?: A Little Help needed standing up from a chair using your arms (e.g., wheelchair or bedside chair)?: A Little Help needed to walk in hospital room?: A Lot Help needed climbing 3-5 steps with a railing? : Total 6 Click Score: 15    End of Session Equipment Utilized During Treatment: Gait belt Activity Tolerance: Patient limited by pain Patient left: in chair;with call bell/phone within reach;with chair alarm set Nurse Communication: Mobility status PT Visit Diagnosis: Unsteadiness on feet (R26.81);Pain;Difficulty in walking, not elsewhere classified (R26.2) Pain - Right/Left: Left Pain - part of body: Hip(groin)     Time: 3532-9924 PT Time Calculation (min) (ACUTE ONLY): 20 min  Charges:  $Gait Training: 8-22 mins                     Earney Navy, PTA Acute Rehabilitation Services Pager: [phone] Office: [phone]     Darliss Cheney 10/21/2018, 4:16 PM

## 2018-10-21 NOTE — Progress Notes (Signed)
PROGRESS NOTE  Meredith Gonzalez GEZ:[id number] DOB: 07/12/1935 DOA: 10/18/2018 PCP: Sinclair Ship, MD  Brief History    Meredith Gonzalez is a 83 y.o. female with medical history significant of A.Fib on eliquis, prior stroke and multiple stroke work ups with stroke like symptoms so far this year.   Patient called son around 4pm today because she was having difficulty speaking.  Described as slurring words and difficulty getting her words out.  She was also having difficulty ambulating due to feeling off ballance as well as L groin pain which is severe and has been present for the past several days.  She thinks she may have twisted her groin the wrong way getting out of chair but doesn't mention a specific fall event.   ED Course: L superior pubic ramus fracture.  Her slurred speech has improved but she still has some difficulty getting her words out it seems.  She also has some L sided weakness which is new she reports.   CT head neg.  Patient transferred to [facility]. Neurology was consulted. She is on the stroke pathway. Lt arm weakness appears to be chronic. 2D echo and carotid dopplers are pending. Repeat CT brain will be performed tomorrow. The patient is unable to have MRI due to PPM. PT/OT/SLP have been consulted and will evaluate the patient. She will be continued on eliquis which she takes for atrial fibrillation.   Consultants  . Neurology  Procedures  . None  Antibiotics   Anti-infectives (From admission, onward)   None      Subjective  The patient is resting comfortably. She is complaining of her back pain and a headache. She is given a one time dose of ultram.  Objective   Vitals:  Vitals:   10/21/18 1139 10/21/18 1606  BP: (!) 110/48 (!) 104/50  Pulse: 66 72  Resp: 18 16  Temp: 97.8 F (36.6 C) 98 F (36.7 C)  SpO2: 96% 96%    Exam:  Constitutional:  . The patient is awake, alert, and oriented x 3. No acute distress. Respiratory:  . There is no increased work of  breathing. . No wheezes, rales, or rhonchi- . No tactile fremitus Cardiovascular:  . Regular rate and rhythm . No murmurs, ectopy, or gallups. . No lateral PMI. No thrills.  Abdomen:  . Abdomen is soft, non-tender, non-distended . No hernias, masses, or organomegaly. . Normoactive bowel sounds.  Musculoskeletal:  . No cyanosis, clubbing, or edema Skin:  . No rashes, lesions, ulcers . palpation of skin: no induration or nodules Neurologic:  . CN 2-12 intact . Weakness left upper and lower extremity ? Patient states is chronic. Psychiatric:  . Mental status  Mood, affect appropriate  Orientation to person, place, time  . judgment and insight appear intact    I have personally reviewed the following:   Today's Data  . Vitals, lipid panels  Imaging  . Right Carotid: 1-39% stenosis, CEA remains patent. . Left Carotid: 1-39 stenosis. No change since 05/31/2018 . Vertebrals: B/L vertebral arteries demonstrate antegrade flow.  . Subclavians: Normal flow hemodynamics bilaterally.  Cardiology Data  . EF 60-65% . Impaired relaxation  Scheduled Meds: . amiodarone  200 mg Oral BH-q7a  . apixaban  2.5 mg Oral BID  . atorvastatin  20 mg Oral Daily  . Chlorhexidine Gluconate Cloth  6 each Topical Q0600  . diclofenac sodium  2 g Topical QID  . docusate sodium  100 mg Oral Daily  . feeding supplement (ENSURE ENLIVE)  237 mL Oral BID BM  . ferrous sulfate  325 mg Oral BID WC  . gabapentin  300 mg Oral q morning - 10a  . gabapentin  600 mg Oral QHS  . levothyroxine  75 mcg Oral Q0600  . mometasone-formoterol  2 puff Inhalation BID  . mupirocin ointment  1 application Nasal BID  . pantoprazole  40 mg Oral Daily  . sertraline  25 mg Oral Daily  . tamsulosin  0.4 mg Oral Daily   Continuous Infusions:   Principal Problem:   Closed fracture of left superior pubic ramus, initial encounter (HCC) Active Problems:   Atrial fibrillation (HCC)   Chronic diastolic congestive heart  failure (HCC)   Stroke-like symptom   LOS: 3 days   A & P  L superior pubic ramus fx : PT/OT has been consulted. Pain control with norco PRN and tylenol. The patient's son has cautioned about use of narcotics due to his mother's prior dependence upon narcotics.  Stroke like symptoms: The patient now states that left upper extremity weakness is chronic. Her aphasia has improved. Unclear if weakness in LLE is due to pelvic fracture or due to neurological deficits. Patient will have a repeat CT tomorrow to better evaluate. Dr. Leonel Ramsay doubts that this represents a new stroke today. Continue statin and eliquis.  Hypotension: Resolved with 500 cc bolus ordered. Monitor blood pressures. Monitor.   Atrial fibrillation (Type: Clinically undetermined): Continue eliquis and amiodarone.  Rate is controlled.  I have seen and examined this patient myself. I have spent 32 minutes in her evaluation and care.   DVT prophylaxis: Eliquis Code Status: Full Family Communication: None available. Disposition Plan: TBD  Mariko Nowakowski, DO [facility] Direct contact: see [email or web address]  7PM-7AM contact night coverage as above 10/21/2018, 5:44 PM  LOS: 1 day

## 2018-10-22 NOTE — Progress Notes (Signed)
Pt discharge education and instructions completed. Report called off to nurse Joy at [facility] facility. Pt IV and telemetry removed; pt discharged to SNF and pt picked up by [facility] to be transported off to disposition. Pt transported off unit via stretcher with belongings to the side. Delia Heady RN

## 2018-10-22 NOTE — Plan of Care (Signed)
Progressing

## 2018-10-22 NOTE — Discharge Summary (Signed)
Physician Discharge Summary  Meredith Gonzalez SKA:[id number] DOB: 09/14/1935 DOA: 10/18/2018  PCP: Sinclair Ship, MD  Admit date: 10/18/2018 Discharge date: 10/22/2018  Admitted From: Home Disposition: Rehab  Recommendations for Outpatient Follow-up:  1. Follow up with PCP in 1-2 weeks 2. Please obtain BMP/CBC in one week  Discharge Condition: Stable CODE STATUS: DNR Diet recommendation: As tolerated  Brief/Interim Summary: Meredith Gonzalez a 83 y.o.femalewith medical history significant ofA.Fib on eliquis, prior stroke and multiple stroke work ups with stroke like symptoms so far this year. Patient called son around 4pm today because she was having difficulty speaking. Described as slurring words and difficulty getting her words out. She was also having difficulty ambulating due to feeling off ballance as well as L groin pain which is severe and has been present for the past several days. She thinks she may have twisted her groin the wrong way getting out of chair but doesn't mention a specific fall event.L superior pubic ramus fracture. Her slurred speech has improved but she still has some difficulty getting her words out it seems. She also has some L sided weakness which is new she reports. CT head neg. Patient transferred to [facility]. Neurology was consulted. She is on the stroke pathway. Lt arm weakness appears to be chronic. 2D echo and carotid dopplers are pending. Repeat CT brain will be performed tomorrow. The patient is unable to have MRI due to PPM. PT/OT/SLP have been consulted and will evaluate the patient. She will be continued on eliquis which she takes for atrial fibrillation.   Patient admitted as above for acute dysarthria as well as left groin pain.  Patient noted to have nonsurgical left superior pubic ramus fracture.  Patient evaluated for possible CVA as below, essentially unremarkable, MRI unable to be performed due to patient's pacemaker placement and further work-up not  commended by neurology given previous multiple work-ups with negative findings, resolution of symptoms and patient already on full dose anticoagulation. At this time patient's pain is well controlled, he recommending ongoing physical therapy and rehab at skilled nursing facility.  Patient otherwise stable and agreeable for discharge to facility as above.  Discharge Diagnoses:  Principal Problem:   Closed fracture of left superior pubic ramus, initial encounter ([facility]) Active Problems:   Atrial fibrillation (HCC)   Chronic diastolic congestive heart failure (HCC)   Stroke-like symptom  Discharge Instructions Continue all medications as prescribed, continue physical therapy as tolerated.  Follow-up with PCP in 1 to 2 weeks as scheduled.  Allergies as of 10/22/2018      Reactions   Valium [diazepam] Anxiety   Makes patient hyper   Robaxin [methocarbamol] Other (See Comments)   Makes pt loopy/acts drunk   Tramadol Other (See Comments)   sedation   Codeine Hives, Itching   Can take with Benadryl   Darifenacin Itching   Can take with Benadryl   Darvon [propoxyphene Hcl] Itching   Can take with Benadryl   Daypro [oxaprozin] Itching   Can take with Benadryl   Enablex [darifenacin Hydrobromide Er] Itching   Can take with Benadryl   Oxycodone Itching   Can take with Benadryl   Propoxyphene Itching   Can take with Benadryl   Risperdal [risperidone] Itching   Can take with Benadryl   Talwin [pentazocine] Itching   Can take with Benadryl   Vicodin [hydrocodone-acetaminophen] Itching   Can take with Benadryl      Medication List    STOP taking these medications   diclofenac sodium 1 %  Gel Commonly known as: VOLTAREN   feeding supplement (ENSURE ENLIVE) Liqd   LORazepam 0.5 MG tablet Commonly known as: Ativan   tamsulosin 0.4 MG Caps capsule Commonly known as: FLOMAX     TAKE these medications   acetaminophen 500 MG tablet Commonly known as: TYLENOL Take 500 mg by mouth  every 4 (four) hours as needed for moderate pain.   albuterol 108 (90 Base) MCG/ACT inhaler Commonly known as: VENTOLIN HFA Inhale 2 puffs into the lungs every 6 (six) hours as needed for wheezing or shortness of breath.   amiodarone 200 MG tablet Commonly known as: PACERONE Take 1 tablet (200 mg total) by mouth every morning.   apixaban 2.5 MG Tabs tablet Commonly known as: ELIQUIS Take 1 tablet (2.5 mg total) by mouth 2 (two) times daily.   atorvastatin 20 MG tablet Commonly known as: LIPITOR Take 1 tablet (20 mg total) by mouth daily for 30 days.   budesonide-formoterol 160-4.5 MCG/ACT inhaler Commonly known as: Symbicort Inhale 2 puffs into the lungs 2 (two) times daily.   calcitonin (salmon) 200 UNIT/ACT nasal spray Commonly known as: MIACALCIN/FORTICAL Place 1 spray into the nose daily.   docusate sodium 100 MG capsule Commonly known as: COLACE Take 1 capsule (100 mg total) by mouth daily.   ferrous sulfate 325 (65 FE) MG tablet Take 1 tablet (325 mg total) by mouth 2 (two) times daily with a meal.   flintstones complete 60 MG chewable tablet Chew 1 tablet by mouth daily.   gabapentin 300 MG capsule Commonly known as: NEURONTIN Take 1-2 capsules (300-600 mg total) by mouth See admin instructions. Take 1 capsule (300 mg) by mouth in the mornning & take 2 capsules (600 mg) by mouth at night   levothyroxine 75 MCG tablet Commonly known as: SYNTHROID Take 1 tablet (75 mcg total) by mouth daily at 6 (six) AM.   nitroGLYCERIN 0.4 MG SL tablet Commonly known as: NITROSTAT Place 1 tablet (0.4 mg total) under the tongue every 5 (five) minutes as needed for chest pain.   pantoprazole 40 MG tablet Commonly known as: PROTONIX Take 1 tablet (40 mg total) by mouth daily.   sertraline 25 MG tablet Commonly known as: ZOLOFT   VITAMIN B-3 PO Take 1 tablet by mouth 2 (two) times a day.       Allergies  Allergen Reactions  . Valium [Diazepam] Anxiety    Makes  patient hyper  . Robaxin [Methocarbamol] Other (See Comments)    Makes pt loopy/acts drunk  . Tramadol Other (See Comments)    sedation  . Codeine Hives and Itching    Can take with Benadryl   . Darifenacin Itching    Can take with Benadryl  . Darvon [Propoxyphene Hcl] Itching    Can take with Benadryl  . Daypro [Oxaprozin] Itching    Can take with Benadryl  . Enablex [Darifenacin Hydrobromide Er] Itching    Can take with Benadryl  . Oxycodone Itching    Can take with Benadryl   . Propoxyphene Itching    Can take with Benadryl  . Risperdal [Risperidone] Itching    Can take with Benadryl  . Talwin [Pentazocine] Itching    Can take with Benadryl  . Vicodin [Hydrocodone-Acetaminophen] Itching    Can take with Benadryl     Consultations: Neurology  Procedures/Studies: Dg Chest 2 View  Result Date: 10/19/2018 CLINICAL DATA:  TIA. EXAM: CHEST - 2 VIEW COMPARISON:  08/17/2018.  07/09/2018.  02/11/2018. FINDINGS: Cardiac pacer in  stable position. Stable cardiomegaly stable bilateral chronic interstitial prominence. No acute infiltrate. No pleural effusion or pneumothorax. Stable mild lower thoracic vertebral body compression fracture. Diffuse osteopenia degenerative change. IMPRESSION: 1. Cardiac pacer stable position. Stable cardiomegaly. No pulmonary venous congestion. 2. Stable changes of chronic interstitial lung disease. No acute pulmonary disease. Electronically Signed   By: Marcello Moores  Register   On: 10/19/2018 07:35   Ct Head Wo Contrast  Result Date: 10/20/2018 CLINICAL DATA:  Acute headache. Syncope. Recurrent falls. Hypertension and diabetes. EXAM: CT HEAD WITHOUT CONTRAST TECHNIQUE: Contiguous axial images were obtained from the base of the skull through the vertex without intravenous contrast. COMPARISON:  10/19/2018 FINDINGS: Brain: Age related volume loss. Chronic small-vessel ischemic changes of the cerebral hemispheric white matter. No sign of acute infarction, mass  lesion, hemorrhage, hydrocephalus or extra-axial collection. Vascular: There is atherosclerotic calcification of the major vessels at the base of the brain. Skull: Negative Sinuses/Orbits: Clear/normal Other: None IMPRESSION: Atrophy and chronic small-vessel ischemic changes. No acute finding. No change since yesterday or recent studies. Electronically Signed   By: Nelson Chimes M.D.   On: 10/20/2018 21:50   Ct Head Wo Contrast  Result Date: 10/19/2018 CLINICAL DATA:  Stroke, follow-up stroke EXAM: CT HEAD WITHOUT CONTRAST TECHNIQUE: Contiguous axial images were obtained from the base of the skull through the vertex without intravenous contrast. COMPARISON:  Head CT 10/18/2018 FINDINGS: Brain: There is no acute intracranial hemorrhage or demarcated territorial infarction. No evidence of intracranial mass. No midline shift or extra-axial collection. Unchanged moderate generalized parenchymal atrophy. Moderate ill-defined hypoattenuation of the cerebral white matter is nonspecific, but consistent with chronic small vessel ischemic disease. Vascular: No hyperdense vessel. Atherosclerotic calcification of the carotid artery siphons. Skull: Normal. Negative for fracture or focal lesion. Sinuses/Orbits: The imaged globes and orbits are unremarkable. The imaged paranasal sinuses and mastoid air cells are well-aerated IMPRESSION: Unchanged CT appearance of the brain as compared to prior exam 10/18/2018. No evidence of acute intracranial abnormality. Specifically, no evidence of acute infarct. Moderate generalized parenchymal atrophy and chronic small vessel ischemic disease. Electronically Signed   By: Kellie Simmering   On: 10/19/2018 13:34   Ct Head Wo Contrast  Result Date: 10/18/2018 CLINICAL DATA:  Slurred speech EXAM: CT HEAD WITHOUT CONTRAST TECHNIQUE: Contiguous axial images were obtained from the base of the skull through the vertex without intravenous contrast. COMPARISON:  08/20/2018 FINDINGS: Brain: No  evidence of acute infarction, hemorrhage, hydrocephalus, extra-axial collection or mass lesion/mass effect. Periventricular and deep white matter hypodensity. Vascular: No hyperdense vessel or unexpected calcification. Skull: Normal. Negative for fracture or focal lesion. Sinuses/Orbits: No acute finding. Other: None. IMPRESSION: No acute intracranial pathology. Small-vessel white matter disease. MRI may be used to more sensitively evaluate for acute diffusion restricting infarction if suspected. Electronically Signed   By: Eddie Candle M.D.   On: 10/18/2018 19:00   Dg Hip Unilat W Or Wo Pelvis 2-3 Views Left  Result Date: 10/18/2018 CLINICAL DATA:  Left hip pain x several days EXAM: DG HIP (WITH OR WITHOUT PELVIS) 2-3V LEFT COMPARISON:  06/27/2018 FINDINGS: Status post ORIF of an old intertrochanteric left hip fracture. No evidence of hardware complication. Suspected new nondisplaced fracture involving the left superior pubic ramus. Old left inferior pubic ramus fracture deformity, superimposed acute fracture not discretely visualized but difficult to exclude. Status post ORIF of an old right hip fracture. No evidence of hardware complication. IMPRESSION: Acute nondisplaced left superior pubic ramus fracture. Old left inferior pubic ramus fracture deformity,  acute superimposed fracture not discretely visualized but difficult to exclude. Status post ORIF of the bilateral hips, without evidence of hardware complication. Electronically Signed   By: Julian Hy M.D.   On: 10/18/2018 20:00   Vas US Carotid (at [facility] Only)  Result Date: 10/19/2018 Carotid Arterial Duplex Study Indications:       Speech disturbance and Gait disturbance. Risk Factors:      Hypertension, Diabetes, past history of smoking. Other Factors:     History of CVA 2000, 05/2018, Atrial fibrillation, Right CEA                    05/2018. Comparison Study:  Prior study from 05/31/18 is available for comparison. Performing Technologist:  Sharion Dove RVS  Examination Guidelines: A complete evaluation includes B-mode imaging, spectral Doppler, color Doppler, and power Doppler as needed of all accessible portions of each vessel. Bilateral testing is considered an integral part of a complete examination. Limited examinations for reoccurring indications may be performed as noted.  Right Carotid Findings: +----------+--------+--------+--------+------------+------------------+           PSV cm/sEDV cm/sStenosisDescribe    Comments           +----------+--------+--------+--------+------------+------------------+ CCA Prox  78      17                          intimal thickening +----------+--------+--------+--------+------------+------------------+ CCA Distal70      12                          intimal thickening +----------+--------+--------+--------+------------+------------------+ ICA Prox  93      21              heterogenouspatent CEA         +----------+--------+--------+--------+------------+------------------+ ICA Distal111     29                                             +----------+--------+--------+--------+------------+------------------+ ECA       365     17                                             +----------+--------+--------+--------+------------+------------------+ +----------+--------+-------+--------+-------------------+           PSV cm/sEDV cmsDescribeArm Pressure (mmHG) +----------+--------+-------+--------+-------------------+ [id number]                                        +----------+--------+-------+--------+-------------------+ +---------+--------+--+--------+--+ VertebralPSV cm/s61EDV cm/s12 +---------+--------+--+--------+--+  Left Carotid Findings: +----------+--------+--------+--------+--------+------------------+           PSV cm/sEDV cm/sStenosisDescribeComments           +----------+--------+--------+--------+--------+------------------+ CCA Prox   103     15                      intimal thickening +----------+--------+--------+--------+--------+------------------+ CCA Distal75      16                      intimal thickening +----------+--------+--------+--------+--------+------------------+ ICA Prox  77      20  calcificShadowing          +----------+--------+--------+--------+--------+------------------+ ICA Distal88      19                                         +----------+--------+--------+--------+--------+------------------+ ECA       114     6                                          +----------+--------+--------+--------+--------+------------------+ +----------+--------+--------+--------+-------------------+ SubclavianPSV cm/sEDV cm/sDescribeArm Pressure (mmHG) +----------+--------+--------+--------+-------------------+           148                                         +----------+--------+--------+--------+-------------------+ +---------+--------+--+--------+--+ VertebralPSV cm/s48EDV cm/s11 +---------+--------+--+--------+--+  Summary: Right Carotid: Velocities in the right ICA are consistent with a 1-39% stenosis.                CEA remains patent. Left Carotid: Velocities in the left ICA are consistent with a 1-39% stenosis.               No significant change since study done 05/31/18. Vertebrals:  Bilateral vertebral arteries demonstrate antegrade flow. Subclavians: Normal flow hemodynamics were seen in bilateral subclavian              arteries. *See table(s) above for measurements and observations.  Electronically signed by Ruta Hinds MD on 10/19/2018 at 6:00:18 PM.    Final     Subjective: No acute issues or events overnight, patient otherwise stable and agreeable for discharge as above, declines chest pain, shortness of breath, nausea, vomiting, diarrhea, constipation, headache, fevers, chills.   Discharge Exam: Vitals:   10/22/18 0728 10/22/18 0831  BP:  (!)  119/52  Pulse: 65 66  Resp: 16 18  Temp:  97.8 F (36.6 C)  SpO2: 95% 98%   Vitals:   10/21/18 2353 10/22/18 0434 10/22/18 0728 10/22/18 0831  BP: (!) 92/43 (!) 104/52  (!) 119/52  Pulse: 75 69 65 66  Resp: 18 19 16 18   Temp: 97.7 F (36.5 C) 97.7 F (36.5 C)  97.8 F (36.6 C)  TempSrc: Oral Oral  Oral  SpO2: 92% 95% 95% 98%  Weight:      Height:        General:  Pleasantly resting in bed, No acute distress. HEENT:  Normocephalic atraumatic.  Sclerae nonicteric, noninjected.  Extraocular movements intact bilaterally. Neck:  Without mass or deformity.  Trachea is midline. Lungs:  Clear to auscultate bilaterally without rhonchi, wheeze, or rales. Heart:  Regular rate and rhythm.  Without murmurs, rubs, or gallops. Abdomen:  Soft, nontender, nondistended.  Without guarding or rebound. Extremities: Without cyanosis, clubbing, edema, or obvious deformity. Vascular:  Dorsalis pedis and posterior tibial pulses palpable bilaterally. Skin:  Warm and dry, no erythema, no ulcerations.   The results of significant diagnostics from this hospitalization (including imaging, microbiology, ancillary and laboratory) are listed below for reference.     Microbiology: Recent Results (from the past 240 hour(s))  SARS Coronavirus 2 (Performed in [facility] hospital lab)     Status: None   Collection Time: 10/18/18 10:53 PM   Specimen: Nasopharyngeal Swab  Result  Value Ref Range Status   SARS Coronavirus 2 NEGATIVE NEGATIVE Final    Comment: (NOTE) If result is NEGATIVE SARS-CoV-2 target nucleic acids are NOT DETECTED. The SARS-CoV-2 RNA is generally detectable in upper and lower  respiratory specimens during the acute phase of infection. The lowest  concentration of SARS-CoV-2 viral copies this assay can detect is 250  copies / mL. A negative result does not preclude SARS-CoV-2 infection  and should not be used as the sole basis for treatment or other  patient management decisions.  A  negative result may occur with  improper specimen collection / handling, submission of specimen other  than nasopharyngeal swab, presence of viral mutation(s) within the  areas targeted by this assay, and inadequate number of viral copies  (<250 copies / mL). A negative result must be combined with clinical  observations, patient history, and epidemiological information. If result is POSITIVE SARS-CoV-2 target nucleic acids are DETECTED. The SARS-CoV-2 RNA is generally detectable in upper and lower  respiratory specimens dur ing the acute phase of infection.  Positive  results are indicative of active infection with SARS-CoV-2.  Clinical  correlation with patient history and other diagnostic information is  necessary to determine patient infection status.  Positive results do  not rule out bacterial infection or co-infection with other viruses. If result is PRESUMPTIVE POSTIVE SARS-CoV-2 nucleic acids MAY BE PRESENT.   A presumptive positive result was obtained on the submitted specimen  and confirmed on repeat testing.  While 2019 novel coronavirus  (SARS-CoV-2) nucleic acids may be present in the submitted sample  additional confirmatory testing may be necessary for epidemiological  and / or clinical management purposes  to differentiate between  SARS-CoV-2 and other Sarbecovirus currently known to infect humans.  If clinically indicated additional testing with an alternate test  methodology [phone]) is advised. The SARS-CoV-2 RNA is generally  detectable in upper and lower respiratory sp ecimens during the acute  phase of infection. The expected result is Negative. Fact Sheet for Patients:  StrictlyIdeas.no Fact Sheet for Healthcare Providers: BankingDealers.co.za This test is not yet approved or cleared by the [place] FDA and has been authorized for detection and/or diagnosis of SARS-CoV-2 by FDA under an Emergency Use  Authorization (EUA).  This EUA will remain in effect (meaning this test can be used) for the duration of the COVID-19 declaration under Section 564(b)(1) of the Act, 21 U.S.C. section 360bbb-3(b)(1), unless the authorization is terminated or revoked sooner. Performed at [facility], [address] [place], [place] [address]   MRSA PCR Screening     Status: Abnormal   Collection Time: 10/19/18  4:50 AM   Specimen: Nasal Mucosa; Nasopharyngeal  Result Value Ref Range Status   MRSA by PCR POSITIVE (A) NEGATIVE Final    Comment:        The GeneXpert MRSA Assay (FDA approved for NASAL specimens only), is one component of a comprehensive MRSA colonization surveillance program. It is not intended to diagnose MRSA infection nor to guide or monitor treatment for MRSA infections. RESULT CALLED TO, READ BACK BY AND VERIFIED WITH: ASIDI,P RN 10/19/2018 AT 0615 SKEEN,P Performed at [facility], [place] [address] [place],  [address]      Labs: BNP (last 3 results) Recent Labs    02/10/18 1519 07/09/18 2129 08/17/18 1507  BNP 550.4* 135.1* 44.0   Basic Metabolic Panel: Recent Labs  Lab 10/18/18 1817 10/21/18 0353  NA 141 140  K 4.1  3.8  CL 112* 111  CO2 22 24  GLUCOSE 101* 105*  BUN 26* 18  CREATININE 0.74 0.63  CALCIUM 8.7* 8.4*   Liver Function Tests: Recent Labs  Lab 10/18/18 1817  AST 28  ALT 24  ALKPHOS 94  BILITOT 0.6  PROT 6.6  ALBUMIN 3.3*   No results for input(s): LIPASE, AMYLASE in the last 168 hours. No results for input(s): AMMONIA in the last 168 hours. CBC: Recent Labs  Lab 10/18/18 1817 10/21/18 0353  WBC 6.3 4.6  NEUTROABS 4.5 3.2  HGB 11.1* 10.3*  HCT 35.7* 32.4*  MCV 105.9* 104.5*  PLT 195 175   Cardiac Enzymes: No results for input(s): CKTOTAL, CKMB, CKMBINDEX, TROPONINI in the last 168 hours. BNP: Invalid input(s): POCBNP CBG: No results for input(s): GLUCAP in the last 168 hours. D-Dimer No results for  input(s): DDIMER in the last 72 hours. Hgb A1c No results for input(s): HGBA1C in the last 72 hours. Lipid Profile No results for input(s): CHOL, HDL, LDLCALC, TRIG, CHOLHDL, LDLDIRECT in the last 72 hours. Thyroid function studies No results for input(s): TSH, T4TOTAL, T3FREE, THYROIDAB in the last 72 hours.  Invalid input(s): FREET3 Anemia work up No results for input(s): VITAMINB12, FOLATE, FERRITIN, TIBC, IRON, RETICCTPCT in the last 72 hours. Urinalysis    Component Value Date/Time   COLORURINE YELLOW 10/18/2018 1933   APPEARANCEUR CLEAR 10/18/2018 1933   LABSPEC 1.025 10/18/2018 1933   PHURINE 6.0 10/18/2018 1933   GLUCOSEU NEGATIVE 10/18/2018 [place] NEGATIVE 10/18/2018 [place] NEGATIVE 10/18/2018 [place] NEGATIVE 10/18/2018 1933   PROTEINUR NEGATIVE 10/18/2018 1933   NITRITE NEGATIVE 10/18/2018 1933   LEUKOCYTESUR NEGATIVE 10/18/2018 1933   Sepsis Labs Invalid input(s): PROCALCITONIN,  WBC,  LACTICIDVEN Microbiology Recent Results (from the past 240 hour(s))  SARS Coronavirus 2 (Performed in [facility] hospital lab)     Status: None   Collection Time: 10/18/18 10:53 PM   Specimen: Nasopharyngeal Swab  Result Value Ref Range Status   SARS Coronavirus 2 NEGATIVE NEGATIVE Final    Comment: (NOTE) If result is NEGATIVE SARS-CoV-2 target nucleic acids are NOT DETECTED. The SARS-CoV-2 RNA is generally detectable in upper and lower  respiratory specimens during the acute phase of infection. The lowest  concentration of SARS-CoV-2 viral copies this assay can detect is 250  copies / mL. A negative result does not preclude SARS-CoV-2 infection  and should not be used as the sole basis for treatment or other  patient management decisions.  A negative result may occur with  improper specimen collection / handling, submission of specimen other  than nasopharyngeal swab, presence of viral mutation(s) within the  areas targeted by this assay, and  inadequate number of viral copies  (<250 copies / mL). A negative result must be combined with clinical  observations, patient history, and epidemiological information. If result is POSITIVE SARS-CoV-2 target nucleic acids are DETECTED. The SARS-CoV-2 RNA is generally detectable in upper and lower  respiratory specimens dur ing the acute phase of infection.  Positive  results are indicative of active infection with SARS-CoV-2.  Clinical  correlation with patient history and other diagnostic information is  necessary to determine patient infection status.  Positive results do  not rule out bacterial infection or co-infection with other viruses. If result is PRESUMPTIVE POSTIVE SARS-CoV-2 nucleic acids MAY BE PRESENT.   A presumptive positive result was obtained on the submitted specimen  and confirmed on repeat testing.  While  2019 novel coronavirus  (SARS-CoV-2) nucleic acids may be present in the submitted sample  additional confirmatory testing may be necessary for epidemiological  and / or clinical management purposes  to differentiate between  SARS-CoV-2 and other Sarbecovirus currently known to infect humans.  If clinically indicated additional testing with an alternate test  methodology [phone]) is advised. The SARS-CoV-2 RNA is generally  detectable in upper and lower respiratory sp ecimens during the acute  phase of infection. The expected result is Negative. Fact Sheet for Patients:  StrictlyIdeas.no Fact Sheet for Healthcare Providers: BankingDealers.co.za This test is not yet approved or cleared by the [place] FDA and has been authorized for detection and/or diagnosis of SARS-CoV-2 by FDA under an Emergency Use Authorization (EUA).  This EUA will remain in effect (meaning this test can be used) for the duration of the COVID-19 declaration under Section 564(b)(1) of the Act, 21 U.S.C. section 360bbb-3(b)(1), unless the  authorization is terminated or revoked sooner. Performed at [facility], [address] [place], [place] [address]   MRSA PCR Screening     Status: Abnormal   Collection Time: 10/19/18  4:50 AM   Specimen: Nasal Mucosa; Nasopharyngeal  Result Value Ref Range Status   MRSA by PCR POSITIVE (A) NEGATIVE Final    Comment:        The GeneXpert MRSA Assay (FDA approved for NASAL specimens only), is one component of a comprehensive MRSA colonization surveillance program. It is not intended to diagnose MRSA infection nor to guide or monitor treatment for MRSA infections. RESULT CALLED TO, READ BACK BY AND VERIFIED WITH: ASIDI,P RN 10/19/2018 AT 0615 SKEEN,P Performed at [facility], [place] [address], [place], [place] [address]    Time coordinating discharge: Over 30 minutes  SIGNED:  Little Ishikawa, DO [facility] 10/22/2018, 11:56 AM Pager   If 7PM-7AM, please contact night-coverage [email or web address] Password [email or web address]

## 2018-10-22 NOTE — TOC Transition Note (Signed)
Transition of Care [facility]) - CM/SW Discharge Note   Patient Details  Name: Meredith Gonzalez MRN: [id number] Date of Birth: 1935-04-18  Transition of Care [facility] CM/SW Contact:  Geralynn Ochs, LCSW Phone Number: 10/22/2018, 2:12 PM   Clinical Narrative:  Nurse to call report to [phone], Room 36     Final next level of care: Skilled Nursing Facility Barriers to Discharge: Barriers Resolved   Patient Goals and CMS Choice Patient states their goals for this hospitalization and ongoing recovery are:: to feel better CMS Medicare.gov Compare Post Acute Care list provided to:: Patient Choice offered to / list presented to : Patient  Discharge Placement              Patient [facility] bed at: [facility] Patient to be transferred to facility by: [place] Name of family member notified: Son Patient and family notified of of transfer: 10/22/18  Discharge Plan and Services     Post Acute Care Choice: [place]                               Social Determinants of Health (SDOH) Interventions     Readmission Risk Interventions Readmission Risk Prevention Plan 08/19/2018  Transportation Screening Complete  Medication Review Press photographer) Complete  PCP or Specialist appointment within 3-5 days of discharge Complete  HRI or Home Care Consult Complete  SW Recovery Care/Counseling Consult Complete  Palliative Care Screening Not Applicable  Skilled Nursing Facility Complete  Some recent data might be hidden

## 2018-11-09 ENCOUNTER — Other Ambulatory Visit: Payer: Self-pay

## 2018-11-09 ENCOUNTER — Encounter: Payer: Self-pay | Admitting: Adult Health

## 2018-11-09 ENCOUNTER — Ambulatory Visit (INDEPENDENT_AMBULATORY_CARE_PROVIDER_SITE_OTHER): Payer: Medicare Other | Admitting: Adult Health

## 2018-11-09 ENCOUNTER — Inpatient Hospital Stay: Payer: Medicare Other | Admitting: Adult Health

## 2018-11-09 VITALS — BP 121/73 | HR 74 | Temp 97.5°F | Ht 65.0 in | Wt 116.2 lb

## 2018-11-09 DIAGNOSIS — R2990 Unspecified symptoms and signs involving the nervous system: Secondary | ICD-10-CM | POA: Diagnosis not present

## 2018-11-09 DIAGNOSIS — I63131 Cerebral infarction due to embolism of right carotid artery: Secondary | ICD-10-CM | POA: Diagnosis not present

## 2018-11-09 DIAGNOSIS — E785 Hyperlipidemia, unspecified: Secondary | ICD-10-CM

## 2018-11-09 DIAGNOSIS — I10 Essential (primary) hypertension: Secondary | ICD-10-CM | POA: Diagnosis not present

## 2018-11-09 DIAGNOSIS — R519 Headache, unspecified: Secondary | ICD-10-CM

## 2018-11-09 DIAGNOSIS — R51 Headache: Secondary | ICD-10-CM

## 2018-11-09 DIAGNOSIS — Z9889 Other specified postprocedural states: Secondary | ICD-10-CM

## 2018-11-09 MED ORDER — TOPIRAMATE 25 MG PO TABS: 25.0000 mg | ORAL_TABLET | Freq: Every day | ORAL | 3 refills | Status: DC

## 2018-11-09 NOTE — Progress Notes (Signed)
I agree with the above plan 

## 2018-11-09 NOTE — Patient Instructions (Signed)
Start Topamax 25 mg nightly for headache management -can consider increasing slowly if needed  Follow up with Dr. Donald Pore regarding neck fractures  Continue to follow with facility providers for depression/anxiety management  Continue to work with physical and occupational therapy for ongoing weakness and hip fractures   Continue Eliquis (apixaban) daily  and atorvastatin for secondary stroke prevention  Continue to follow up with PCP regarding cholesterol, blood pressure and diabetes management   Continue to monitor blood pressure at home  Maintain strict control of hypertension with blood pressure goal below 130/90, diabetes with hemoglobin A1c goal below 6.5% and cholesterol with LDL cholesterol (bad cholesterol) goal below 70 mg/dL. I also advised the patient to eat a healthy diet with plenty of whole grains, cereals, fruits and vegetables, exercise regularly and maintain ideal body weight.  Followup in the future with me in 3 months or call earlier if needed       Thank you for coming to see [place] at [facility]. I hope we have been able to provide you high quality care today.  You may receive a patient satisfaction survey over the next few weeks. We would appreciate your feedback and comments so that we may continue to improve ourselves and the health of our patients.

## 2018-11-09 NOTE — Progress Notes (Signed)
[facility] [address] [place]. [place] [address] [phone]       [place] Meredith Gonzalez Date of Birth:  Aug 13, 1935 Medical Record Number:  [id number]   Reason for Referral:  hospital stroke follow up    CHIEF COMPLAINT:  Chief Complaint  Patient presents with  . Hospitalization Follow-up    Caregiver present. Rm 9. Patient mentioned that she is still having issues with her balance. She also mentioned that she has been having headaches everyday. She currently has a headache.     HPI: Meredith Gonzalez being seen today for in office hospital follow-up regarding multiple episodes of strokelike symptoms/TIA.  History obtained from patient, facility CNA and chart review. Reviewed all radiology images and labs personally.  [name] is a 83 y.o. female with history of a previous stroke, atrial fibrillation on Eliquis, syncope, permanent pacemaker, coronary artery disease with previous MI, hypothyroidism, hypertension, congestive heart failure, and diabetes mellitus. Initially presented on 03/2018 for stroke symptoms including left sided tingling, slurred speech, left-sided ataxia, expressive aphasia and diplopia.  There was question whether this was more consistent with right MCA involvement or posterior circulation ischemia.  MRI cannot be done due to pacemaker.  CT no acute abnormality x2.  CT head and neck right ICA 60% stenosis with bulky soft plaques.  Carotid Doppler negative.  EF 60 to 65% LDL 52 and A1c 6.3.  Dr. Donnetta Hutching was consulted for potential right CEA, but eventually that was not performed given not able to rule out posterior circulation ischemia and a stroke likely due to A. fib not on anticoagulation.  She was discharged with Eliquis, however on lower dose at 2.5 mg twice daily. Returned on 05/30/2018 with unsteadiness, increased slurred speech, and double vision. She did not receive IV t-PA due to late presentation.  Neurology  consulted with stroke work-up revealing possible small right brainstem stroke (unable to do MRI) concerning for recurrent stroke symptoms due to right ICA 60% stenosis with significant plaque formation therefore recommended CEA for possible symptomatic right ICA stenosis with vessel imaging showing 60% stenosis of right ICA bulb and carotid ultrasound showing right ICA 40 to 59% stenosis.  Underwent right CEA on 3/53/2992 without complication.  Discharged to CIR for ongoing therapy and then discharged home. Returned on 07/09/2018 with episodes of dizziness and balance difficulties with suspected posterior circulation TIA.  CT head unremarkable.  CTA head/neck showed right CEA patent.  Recommended continuation of Eliquis and atorvastatin 20 mg daily. Returned on 08/18/2018 with dizziness, generalized weakness and abnormal speech.  Diagnosed with possible TIA as CT head negative and unable to perform MRI.  Right CEA remains patent with left ICA approximately 50% atheromatous stenosis.  Recommended continuation of apixaban 2.5 mg twice daily along with atorvastatin 20 mg daily. Returned on 10/18/2018 for acute dysarthria and left groin pain.  Neurology consulted with CT head unremarkable.  No further stroke work-up recommended. Hip imaging showed left superior pubic ramus fracture.  Fracture nonsurgical and recommended ongoing physical therapy at SNF.   Residual balance difficulties and intermittent slurred speech.  Continues to reside at [facility] where she continues to receive PT/OT daily.  Currently uses rolling walker for ambulation due to balance and left hip pain with recent fracture. Slurred speech typically occurs with increased activity or fatigue.  She also endorses increased occipital headaches and intermittent temporal headaches since her stroke.  Underlying history of chronic cervical fractures where she would intermittently experience  occipital headaches but these have worsened since her  stroke.  Headaches occur daily and are typically located occipital or temporal.  She denies associated photophobia or phonophobia.  She will take Tylenol with benefit.  She did have follow-up with Dr. Vertell Limber as recommended for chronic cervical fractures approximately 3 months ago with use of a cervical collar for 8 weeks.  Has not had follow-up since that time.  She has also experienced anxiety/depression with improvement on sertraline and buspirone which was started approximately 2 weeks prior.  She continues on Eliquis without bleeding or bruising.  Continues on atorvastatin without myalgias.  Blood pressure today stable at 121/73.  No further concerns at this time.     ROS:   14 system review of systems performed and negative with exception of frequent waking, walking difficulty, neck pain, itching, headache, speech difficulty, balance and memory/changes  PMH:  Past Medical History:  Diagnosis Date  . Anemia   . Anxiety   . Arthritis    "all over" (02/11/2018)  . Basal cell carcinoma    "left leg" (02/11/2018)  . Cervical spine fracture ([place]) 12/2017   "C1-2"  . CHF (congestive heart failure) ([place])   . Chronic bronchitis ([place])   . Chronic neck pain    "since I broke my neck 6-8 wk ago" (02/11/2018)  . Diabetic peripheral neuropathy ([place])   . Fibromyalgia   . Fracture of right humerus   . Generalized weakness   . Headache    "weekly" (02/11/2018)  . History of blood transfusion    "related to one of my femur surgeries" (02/11/2018)  . History of echocardiogram    Echo 8/18: EF 50-55, no RWMA, Gr 1 DD, calcified AV leaflets, MAC, trivial MR, mod LAE, PASP 37  . Hypertension   . Hypothyroidism   . Incontinence of urine   . Ischemic stroke ([place]) 2000   "lost part of the vision in my right eye" (02/11/2018)  . Major depression, chronic   . Myocardial infarction ([place]) 06/2016  . Pacemaker   . Recurrent falls   . Syncope and collapse   . Type 2 diabetes, diet controlled ([facility])      PSH:  Past Surgical History:  Procedure Laterality Date  . ABDOMINAL HYSTERECTOMY    . ANKLE FRACTURE SURGERY Right   . BASAL CELL CARCINOMA EXCISION Left    "leg"  . CARDIAC CATHETERIZATION  ~ 2014  . CARDIOVERSION N/A 02/13/2018   Procedure: CARDIOVERSION;  Surgeon: Sueanne Margarita, MD;  Location: [facility] ENDOSCOPY;  Service: Cardiovascular;  Laterality: N/A;  . CARDIOVERSION N/A 03/27/2018   Procedure: CARDIOVERSION;  Surgeon: Lelon Perla, MD;  Location: [facility] ENDOSCOPY;  Service: Cardiovascular;  Laterality: N/A;  . CARPAL TUNNEL RELEASE     bilateral  . CATARACT EXTRACTION W/ INTRAOCULAR LENS  IMPLANT, BILATERAL Bilateral   . CHOLECYSTECTOMY OPEN    . COLONOSCOPY    . CORONARY ANGIOPLASTY WITH STENT PLACEMENT  06/2016  . CORONARY STENT INTERVENTION N/A 07/19/2016   Procedure: Coronary Stent Intervention;  Surgeon: Peter M Martinique, MD;  Location: [place] CV LAB;  Service: Cardiovascular;  Laterality: N/A;  . ENDARTERECTOMY Right 06/03/2018   Procedure: ENDARTERECTOMY CAROTID;  Surgeon: Rosetta Posner, MD;  Location: [place];  Service: Vascular;  Laterality: Right;  . EYE SURGERY     right eye catarace/lens implant  . FEMUR FRACTURE SURGERY Bilateral   . FRACTURE SURGERY    . GASTRIC BYPASS    . INSERT / REPLACE /  REMOVE PACEMAKER  ~ 2012  . LEAD REVISION N/A 01/01/2013   Procedure: LEAD REVISION;  Surgeon: Evans Lance, MD;  Location[facility] CATH LAB;  Service: Cardiovascular;  Laterality: N/A;  . LEFT HEART CATH AND CORONARY ANGIOGRAPHY N/A 07/19/2016   Procedure: Left Heart Cath and Coronary Angiography;  Surgeon: Peter M Martinique, MD;  Location: [place] CV LAB;  Service: Cardiovascular;  Laterality: N/A;  . OVARIAN CYST SURGERY     "one side only"  . PATCH ANGIOPLASTY Right 06/03/2018   Procedure: PATCH ANGIOPLASTY USING HEMASHIELD PLATINUM FINESSE;  Surgeon: Rosetta Posner, MD;  Location: [facility];  Service: Vascular;  Laterality: Right;  . TEE WITHOUT CARDIOVERSION N/A  02/13/2018   Procedure: TRANSESOPHAGEAL ECHOCARDIOGRAM (TEE);  Surgeon: Sueanne Margarita, MD;  Location: [facility] ENDOSCOPY;  Service: Cardiovascular;  Laterality: N/A;  . TONSILLECTOMY      Social History:  Social History   Socioeconomic History  . Marital status: Widowed    Spouse name: Not on file  . Number of children: Not on file  . Years of education: Not on file  . Highest education level: Not on file  Occupational History  . Not on file  Social Needs  . Financial resource strain: Not hard at all  . Food insecurity    Worry: Never true    Inability: Never true  . Transportation needs    Medical: No    Non-medical: No  Tobacco Use  . Smoking status: Former Smoker    Packs/day: 0.50    Years: 4.00    Pack years: 2.00    Types: Cigarettes    Quit date: 08/07/1972    Years since quitting: 46.2  . Smokeless tobacco: Never Used  Substance and Sexual Activity  . Alcohol use: Yes    Frequency: Never    Comment: once or twice a year  . Drug use: Never  . Sexual activity: Not on file  Lifestyle  . Physical activity    Days per week: 0 days    Minutes per session: 0 min  . Stress: Only a little  Relationships  . Social Herbalist on phone: Three times a week    Gets together: Three times a week    Attends religious service: More than 4 times per year    Active member of club or organization: No    Attends meetings of clubs or organizations: Never    Relationship status: Widowed  . Intimate partner violence    Fear of current or ex partner: No    Emotionally abused: No    Physically abused: No    Forced sexual activity: No  Other Topics Concern  . Not on file  Social History Narrative  . Not on file    Family History:  Family History  Problem Relation Age of Onset  . Congestive Heart Failure Mother   . Heart attack Father   . Alcoholism Father   . Alcoholism Sister   . Alcoholism Brother   . Alcoholism Brother   . Throat cancer Brother      Medications:   Current Outpatient Medications on File Prior to Visit  Medication Sig Dispense Refill  . acetaminophen (TYLENOL) 500 MG tablet Take 1,000 mg by mouth 3 (three) times daily.     Marland Kitchen albuterol (PROVENTIL HFA;VENTOLIN HFA) 108 (90 Base) MCG/ACT inhaler Inhale 2 puffs into the lungs every 6 (six) hours as needed for wheezing or shortness of breath. 1 Inhaler 1  .  alum & mag hydroxide-simeth (MAALOX/MYLANTA) 200-200-20 MG/5ML suspension Take 20 mLs by mouth every 6 (six) hours as needed for indigestion or heartburn.    Marland Kitchen amiodarone (PACERONE) 200 MG tablet Take 1 tablet (200 mg total) by mouth every morning. 90 tablet 3  . apixaban (ELIQUIS) 2.5 MG TABS tablet Take 1 tablet (2.5 mg total) by mouth 2 (two) times daily.    Marland Kitchen atorvastatin (LIPITOR) 20 MG tablet Take 1 tablet (20 mg total) by mouth daily for 30 days. 30 tablet 0  . budesonide-formoterol (SYMBICORT) 160-4.5 MCG/ACT inhaler Inhale 2 puffs into the lungs 2 (two) times daily. 1 Inhaler 5  . busPIRone (BUSPAR) 7.5 MG tablet Take 7.5 mg by mouth 2 (two) times daily.    . calcitonin, salmon, (MIACALCIN/FORTICAL) 200 UNIT/ACT nasal spray 1 spray every other day. Left nare    . cetaphil (CETAPHIL) cream Apply 1 application topically 3 (three) times daily.    . diphenhydrAMINE (BENADRYL) 25 MG tablet Take 25 mg by mouth every 8 (eight) hours as needed for itching.    . docusate sodium (COLACE) 100 MG capsule Take 1 capsule (100 mg total) by mouth daily. 30 capsule 0  . ferrous sulfate 325 (65 FE) MG tablet Take 1 tablet (325 mg total) by mouth 2 (two) times daily with a meal. 60 tablet 0  . flintstones complete (FLINTSTONES) 60 MG chewable tablet Chew 1 tablet by mouth daily.    Marland Kitchen gabapentin (NEURONTIN) 300 MG capsule Take 300 mg by mouth 2 (two) times daily.    Marland Kitchen gabapentin (NEURONTIN) 300 MG capsule Take 600 mg by mouth at bedtime.    . hydrOXYzine (ATARAX/VISTARIL) 25 MG tablet Take 25 mg by mouth every 6 (six) hours as needed. Stop  date 11/12/2018    . levothyroxine (SYNTHROID, LEVOTHROID) 75 MCG tablet Take 1 tablet (75 mcg total) by mouth daily at 6 (six) AM. 30 tablet 1  . Melatonin 5 MG TABS Take 5 mg by mouth at bedtime.    . nitroGLYCERIN (NITROSTAT) 0.4 MG SL tablet Place 1 tablet (0.4 mg total) under the tongue every 5 (five) minutes as needed for chest pain. 25 tablet 2  . ondansetron (ZOFRAN) 4 MG tablet Take 4 mg by mouth every 6 (six) hours as needed for nausea or vomiting.    . pantoprazole (PROTONIX) 40 MG tablet Take 1 tablet (40 mg total) by mouth daily. (Patient taking differently: Take 40 mg by mouth 2 (two) times daily. ) 30 tablet 1  . sertraline (ZOLOFT) 50 MG tablet Take 50 mg by mouth daily.     No current facility-administered medications on file prior to visit.     Allergies:   Allergies  Allergen Reactions  . Valium [Diazepam] Anxiety    Makes patient hyper  . Robaxin [Methocarbamol] Other (See Comments)    Makes pt loopy/acts drunk  . Tramadol Other (See Comments)    sedation  . Codeine Hives and Itching    Can take with Benadryl   . Darifenacin Itching    Can take with Benadryl  . Darvon [Propoxyphene Hcl] Itching    Can take with Benadryl  . Daypro [Oxaprozin] Itching    Can take with Benadryl  . Enablex [Darifenacin Hydrobromide Er] Itching    Can take with Benadryl  . Oxycodone Itching    Can take with Benadryl   . Propoxyphene Itching    Can take with Benadryl  . Risperdal [Risperidone] Itching    Can take with Benadryl  .  Talwin [Pentazocine] Itching    Can take with Benadryl  . Vicodin [Hydrocodone-Acetaminophen] Itching    Can take with Benadryl      Physical Exam  Vitals:   11/09/18 0954  BP: 121/73  Pulse: 74  Temp: (!) 97.5 F (36.4 C)  TempSrc: Oral  Weight: 116 lb 3.2 oz (52.7 kg)  Height: _0  (1.651 m)   Body mass index is 19.34 kg/m. No exam data present  Depression screen [facility] 2/9 11/09/2018  Decreased Interest 0  Down, Depressed, Hopeless  1  PHQ - 2 Score 1  Altered sleeping -  Tired, decreased energy -  Change in appetite -  Feeling bad or failure about yourself  -  Trouble concentrating -  Moving slowly or fidgety/restless -  Suicidal thoughts -  PHQ-9 Score -  Difficult doing work/chores -     General: Frail pleasant elderly Caucasian female, seated, in no evident distress Head: head normocephalic and atraumatic.   Neck: supple with no carotid or supraclavicular bruits Cardiovascular: regular rate and rhythm, no murmurs Musculoskeletal: no deformity; cervical tenderness with decreased ROM Skin:  no rash/petichiae Vascular:  Normal pulses all extremities   Neurologic Exam Mental Status: Awake and fully alert.   Mild dysarthria.  Oriented to place and time. Recent and remote memory intact. Attention span, concentration and fund of knowledge appropriate. Mood and affect appropriate.  Cranial Nerves: Fundoscopic exam reveals sharp disc margins. Pupils equal, briskly reactive to light. Extraocular movements full without nystagmus. Visual fields full to confrontation. Hearing intact. Facial sensation intact. Face, tongue, palate moves normally and symmetrically.  Motor: Normal bulk and tone.  LUE: 4+/5 with pronator drift.  Bilateral hip flexor weakness L>R Sensory.: intact to touch , pinprick , position and vibratory sensation.  Coordination: Rapid alternating movements normal in all extremities. Finger-to-nose left dysmetria and heel-to-shin difficulty performing due to weakness and left hip pain from recent fracture. Gait and Station: Currently sitting in wheelchair.  Gait assessment deferred as rolling walker not present at visit Reflexes: 2+ and symmetric. Toes downgoing.    NIHSS  3 Modified Rankin  3 CHA2DS2-VASc 9 HAS-BLED 2   Diagnostic Data (Labs, Imaging, Testing)  Ct Angio Head W Or Wo Contrast Ct Angio Neck W And/or Wo Contrast 05/30/2018 IMPRESSION:  1. No emergent finding or change from January  2020.  2. Cervical carotid atherosclerosis with up to 60% stenosis at the right ICA bulb.  3. Chronic lung disease.   Ct Head Wo Contrast 05/30/2018 IMPRESSION:  1. No acute intracranial pathology. Small-vessel white matter disease.  2. Redemonstrated fractures of the dens and C1 vertebral body, better evaluated by prior CT examination of the neck.   Ct Head Wo Contrast 05/31/2018 No appreciable infarct or intracranial hemorrhage.  Transthoracic Echocardiogram  05/31/2018 IMPRESSIONS 1. Limited study. 2. The left ventricle has normal systolic function, with an ejection fraction of 60-65%. The cavity size was normal. No evidence of left ventricular regional wall motion abnormalities. Diastolic function was not assessed. 3. The right ventricle has normal systolc function. The cavity was normal. There is no increase in right ventricular wall thickness. Right ventricular systolic pressure is mildly elevated with an estimated pressure of 38.3 mmHg. Device wire present. 4. Left atrial size was mildly dilated. 5. The aortic valve is tricuspid Mild aortic annular calcification noted. 6. The mitral valve is normal in structure. There is mild mitral annular calcification present. 7. The tricuspid valve was normal in structure. 8. The aortic root is normal  in size and structure.   Bilateral Carotid Dopplers 05/31/2018 Summary: Right Carotid: Velocities in the right ICA are consistent with a 40-59% stenosis. Left Carotid: Velocities in the left ICA are consistent with a 1-39% stenosis. Vertebrals: Bilateral vertebral arteries demonstrate antegrade flow. Subclavians: Normal flow hemodynamics were seen in bilateral subclavian arteries.   CT HEAD WO CONTRAST 07/09/2018 IMPRESSION: 1. No acute intracranial abnormality identified. 2. Stable chronic microvascular ischemic changes and volume loss of the brain.  CT ANGIO HEAD W OR WO CONTRAST CT ANGIO NECK W OR WO CONTRAST 07/10/2018  IMPRESSION: 1. Right carotid endarterectomy is patent. 2. The large vessel occlusion. 3. Mild diffuse distal small vessel disease. 4. Atherosclerotic changes at the left carotid bifurcation without a significant stenosis. 5. Atherosclerotic changes at the aortic arch and cavernous internal carotid arteries bilaterally without a significant stenosis. 6. Chronic type 2 dens fracture. 7. Degenerative changes of the cervical spine are stable.  CT HEAD WO CONTRAST 08/17/2018 IMPRESSION: Stable head CT without acute intracranial findings. Stable mild atrophy and chronic small vessel ischemic changes.  CT ANGIO HEAD W OR WO CONTRAST CT ANGIO NECK W OR WO CONTRAST 08/17/2018 IMPRESSION: 1. Stable CTA of the head and neck. No large vessel occlusion or other acute vascular abnormality. 2. Approximate 50% atheromatous stenosis at the origin of the left ICA, stable. 3. Widely patent right carotid endarterectomy. 4. 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     ASSESSMENT: Meredith Gonzalez is a 83 y.o. year old female here with multiple episodes of strokelike symptoms/TIA with subsequent right CEA in 05/2018 and initiation of Eliquis for atrial fibrillation in 03/2018.  Unable to perform MRI due to pacemaker. Vascular risk factors include atrial fibrillation, CAD with prior MI, HTN, HLD, DM and CHF.  Currently resides at [facility] due to residual deficits and ongoing therapy.  Residual deficits of balance difficulties, intermittent slurred speech and worsening headaches.    PLAN:  1. Recurrent strokes/TIA: Continue Eliquis (apixaban) daily  and atorvastatin for secondary stroke prevention. Maintain strict control of hypertension with blood pressure goal below 130/90, diabetes with hemoglobin A1c goal below 6.5% and cholesterol with LDL cholesterol (bad cholesterol) goal below 70 mg/dL.  I also  advised the patient to eat a healthy diet with plenty of whole grains, cereals, fruits and vegetables, exercise regularly with at least 30 minutes of continuous activity daily and maintain ideal body weight. 2. Balance difficulties: Ongoing participation in PT/OT at current facility for ongoing improvement 3. Headaches: Initiate Topamax 25 mg nightly with discussion regarding potential side effects.  May need to increase in the future if indicated.  Also advised to follow-up with Dr. Donald Pore as headaches could potentially be related to chronic neck fractures versus reoccurring strokes 4. HTN: Advised to continue current treatment regimen.  Today's BP stable.  Advised to continue to monitor at home along with continued follow-up with PCP for management 5. HLD: Advised to continue current treatment regimen along with continued follow-up with PCP for future prescribing and monitoring of lipid panel 6. DMII: Diet controlled and advised to continue to follow with PCP for ongoing management and monitoring 7. Post stroke depression: Advised to continue current regimen and ongoing follow-up with facility providers/PCP for management/monitoring 8. Right ICA stenosis s/p CEA: Ongoing follow-up with vascular surgery outpatient.  Repeat CTA head/neck during recurrent admissions showed patent CEA   Follow up in 3 months or call earlier  if needed   Greater than 50% of time during this 45 minute visit was spent on counseling, explanation of diagnosis of recurrent stroke/TIA, reviewing risk factor management of HTN, HLD and DM, discussion regarding worsening headaches, postop depression and ongoing balance difficulties, planning of further management along with potential future management, and discussion with patient and family answering all questions.    Frann Rider, AGNP-BC  [facility] [address] [place], [place] [address]  Phone [phone] Fax [phone] Note:  This document was prepared with digital dictation and possible smart phrase technology. Any transcriptional errors that result from this process are unintentional.

## 2018-11-11 ENCOUNTER — Telehealth: Payer: Self-pay | Admitting: Cardiology

## 2018-11-11 NOTE — Telephone Encounter (Signed)
Spoke w/ pt about her [employer] home monitor. She stated that she is not sleeping near the monitor right now b/c she fell and broke her hip bone about 3 weeks ago and hasn't been home since. She stated that she should be back home in the next 2 weeks. I will check back in about 2 weeks to make sure monitor is working properly. Pt verbalized understanding.

## 2018-11-11 NOTE — Telephone Encounter (Signed)
-----   Message from Mechele Dawley, RN sent at 11/11/2018 11:21 AM EDT ----- Regarding: disconnected BIO monitor Per [employer] alerts, [name] monitor is disconnected. Please call her when you have a chance.  Thanks! Meredith Gonzalez

## 2018-12-14 NOTE — Telephone Encounter (Signed)
Monitor is updated.

## 2018-12-17 ENCOUNTER — Ambulatory Visit (INDEPENDENT_AMBULATORY_CARE_PROVIDER_SITE_OTHER): Payer: Medicare Other | Admitting: *Deleted

## 2018-12-17 DIAGNOSIS — R001 Bradycardia, unspecified: Secondary | ICD-10-CM

## 2018-12-18 LAB — CUP PACEART REMOTE DEVICE CHECK
Date Time Interrogation Session: 20200925050606
Implantable Lead Implant Date: 20110523
Implantable Lead Implant Date: 20141010
Implantable Lead Location: 753859
Implantable Lead Location: 753860
Implantable Lead Model: 346
Implantable Lead Model: 350
Implantable Lead Serial Number: 28756637
Implantable Lead Serial Number: 29491855
Implantable Pulse Generator Implant Date: 20110523
Pulse Gen Serial Number: 66080052

## 2018-12-25 ENCOUNTER — Encounter: Payer: Self-pay | Admitting: Cardiology

## 2018-12-25 NOTE — Progress Notes (Signed)
Remote pacemaker transmission.   

## 2018-12-31 ENCOUNTER — Emergency Department (HOSPITAL_COMMUNITY): Payer: Medicare Other

## 2018-12-31 ENCOUNTER — Other Ambulatory Visit: Payer: Self-pay

## 2018-12-31 ENCOUNTER — Emergency Department (HOSPITAL_COMMUNITY)
Admission: EM | Admit: 2018-12-31 | Discharge: 2018-12-31 | Disposition: A | Discharge status: Home / Self Care | Payer: Medicare Other | Attending: Emergency Medicine | Admitting: Emergency Medicine

## 2018-12-31 ENCOUNTER — Encounter (HOSPITAL_COMMUNITY): Payer: Self-pay | Admitting: *Deleted

## 2018-12-31 DIAGNOSIS — I5032 Chronic diastolic (congestive) heart failure: Secondary | ICD-10-CM | POA: Diagnosis not present

## 2018-12-31 DIAGNOSIS — E039 Hypothyroidism, unspecified: Secondary | ICD-10-CM | POA: Insufficient documentation

## 2018-12-31 DIAGNOSIS — Z7901 Long term (current) use of anticoagulants: Secondary | ICD-10-CM | POA: Diagnosis not present

## 2018-12-31 DIAGNOSIS — R1030 Lower abdominal pain, unspecified: Secondary | ICD-10-CM | POA: Diagnosis not present

## 2018-12-31 DIAGNOSIS — I2510 Atherosclerotic heart disease of native coronary artery without angina pectoris: Secondary | ICD-10-CM | POA: Insufficient documentation

## 2018-12-31 DIAGNOSIS — Z8673 Personal history of transient ischemic attack (TIA), and cerebral infarction without residual deficits: Secondary | ICD-10-CM | POA: Insufficient documentation

## 2018-12-31 DIAGNOSIS — Z79899 Other long term (current) drug therapy: Secondary | ICD-10-CM | POA: Diagnosis not present

## 2018-12-31 DIAGNOSIS — R112 Nausea with vomiting, unspecified: Secondary | ICD-10-CM | POA: Diagnosis present

## 2018-12-31 DIAGNOSIS — R110 Nausea: Secondary | ICD-10-CM

## 2018-12-31 DIAGNOSIS — Z87891 Personal history of nicotine dependence: Secondary | ICD-10-CM | POA: Diagnosis not present

## 2018-12-31 DIAGNOSIS — Z950 Presence of cardiac pacemaker: Secondary | ICD-10-CM | POA: Insufficient documentation

## 2018-12-31 DIAGNOSIS — I110 Hypertensive heart disease with heart failure: Secondary | ICD-10-CM | POA: Diagnosis not present

## 2018-12-31 LAB — COMPREHENSIVE METABOLIC PANEL
ALT: 21 U/L (ref 0–44)
AST: 32 U/L (ref 15–41)
Albumin: 3 g/dL — ABNORMAL LOW (ref 3.5–5.0)
Alkaline Phosphatase: 104 U/L (ref 38–126)
Anion gap: 15 (ref 5–15)
BUN: 8 mg/dL (ref 8–23)
CO2: 18 mmol/L — ABNORMAL LOW (ref 22–32)
Calcium: 8.3 mg/dL — ABNORMAL LOW (ref 8.9–10.3)
Chloride: 104 mmol/L (ref 98–111)
Creatinine, Ser: 0.67 mg/dL (ref 0.44–1.00)
GFR calc Af Amer: >60 mL/min (ref >60)
GFR calc non Af Amer: >60 mL/min (ref >60)
Glucose, Bld: 133 mg/dL — ABNORMAL HIGH (ref 70–99)
Potassium: 3.3 mmol/L — ABNORMAL LOW (ref 3.5–5.1)
Sodium: 137 mmol/L (ref 135–145)
Total Bilirubin: 1.5 mg/dL — ABNORMAL HIGH (ref 0.3–1.2)
Total Protein: 7 g/dL (ref 6.5–8.1)

## 2018-12-31 LAB — URINALYSIS, ROUTINE W REFLEX MICROSCOPIC
Bacteria, UA: NONE SEEN
Bilirubin Urine: NEGATIVE
Glucose, UA: NEGATIVE mg/dL
Ketones, ur: 20 mg/dL — AB
Leukocytes,Ua: NEGATIVE
Nitrite: NEGATIVE
Protein, ur: 30 mg/dL — AB
Specific Gravity, Urine: 1.035 — ABNORMAL HIGH (ref 1.005–1.030)
pH: 6 (ref 5.0–8.0)

## 2018-12-31 LAB — CBC WITH DIFFERENTIAL/PLATELET
Abs Immature Granulocytes: 0.03 10*3/uL (ref 0.00–0.07)
Basophils Absolute: 0 10*3/uL (ref 0.0–0.1)
Basophils Relative: 0 %
Eosinophils Absolute: 0 10*3/uL (ref 0.0–0.5)
Eosinophils Relative: 0 %
HCT: 40.7 % (ref 36.0–46.0)
Hemoglobin: 13.4 g/dL (ref 12.0–15.0)
Immature Granulocytes: 0 %
Lymphocytes Relative: 4 %
Lymphs Abs: 0.4 10*3/uL — ABNORMAL LOW (ref 0.7–4.0)
MCH: 34.1 pg — ABNORMAL HIGH (ref 26.0–34.0)
MCHC: 32.9 g/dL (ref 30.0–36.0)
MCV: 103.6 fL — ABNORMAL HIGH (ref 80.0–100.0)
Monocytes Absolute: 0.2 10*3/uL (ref 0.1–1.0)
Monocytes Relative: 2 %
Neutro Abs: 8.1 10*3/uL — ABNORMAL HIGH (ref 1.7–7.7)
Neutrophils Relative %: 94 %
Platelets: 225 10*3/uL (ref 150–400)
RBC: 3.93 MIL/uL (ref 3.87–5.11)
RDW: 13.2 % (ref 11.5–15.5)
WBC: 8.7 10*3/uL (ref 4.0–10.5)
nRBC: 0 % (ref 0.0–0.2)

## 2018-12-31 LAB — LIPASE, BLOOD: Lipase: 22 U/L (ref 11–51)

## 2018-12-31 MED ORDER — ONDANSETRON HCL 4 MG/2ML IJ SOLN
4.0000 mg | Freq: Once | INTRAMUSCULAR | Status: AC
  Administered 2018-12-31: 4 mg via INTRAVENOUS
  Filled 2018-12-31: qty 2

## 2018-12-31 MED ORDER — SODIUM CHLORIDE 0.9 % IV BOLUS
500.0000 mL | Freq: Once | INTRAVENOUS | Status: AC
  Administered 2018-12-31: 500 mL via INTRAVENOUS

## 2018-12-31 MED ORDER — ONDANSETRON 4 MG PO TBDP: ORAL_TABLET | ORAL | 0 refills | Status: DC

## 2018-12-31 MED ORDER — IOHEXOL 300 MG/ML  SOLN
100.0000 mL | Freq: Once | INTRAMUSCULAR | Status: AC | PRN
  Administered 2018-12-31: 80 mL via INTRAVENOUS

## 2018-12-31 NOTE — ED Notes (Signed)
Patient transported to CT 

## 2018-12-31 NOTE — ED Provider Notes (Signed)
[facility] EMERGENCY DEPARTMENT Provider Note   CSN: [id number] Arrival date & time: 12/31/18  1110     History   Chief Complaint Chief Complaint  Patient presents with  . Nausea  . Emesis    HPI Meredith Gonzalez is a 83 y.o. female.        Patient complains of nausea vomiting and lower abdominal discomfort  The history is provided by the patient. No language interpreter was used.  Emesis Severity:  Moderate Timing:  Constant Quality:  Stomach contents Able to tolerate:  Liquids Progression:  Unchanged Chronicity:  New Recent urination:  Normal Context: not post-tussive   Relieved by:  Nothing Worsened by:  Nothing Ineffective treatments:  None tried Associated symptoms: abdominal pain   Associated symptoms: no cough, no diarrhea and no headaches     Past Medical History:  Diagnosis Date  . Anemia   . Anxiety   . Arthritis    "all over" (02/11/2018)  . Basal cell carcinoma    "left leg" (02/11/2018)  . Cervical spine fracture ([place]) 12/2017   "C1-2"  . CHF (congestive heart failure) ([place])   . Chronic bronchitis ([place])   . Chronic neck pain    "since I broke my neck 6-8 wk ago" (02/11/2018)  . Diabetic peripheral neuropathy ([place])   . Fibromyalgia   . Fracture of right humerus   . Generalized weakness   . Headache    "weekly" (02/11/2018)  . History of blood transfusion    "related to one of my femur surgeries" (02/11/2018)  . History of echocardiogram    Echo 8/18: EF 50-55, no RWMA, Gr 1 DD, calcified AV leaflets, MAC, trivial MR, mod LAE, PASP 37  . Hypertension   . Hypothyroidism   . Incontinence of urine   . Ischemic stroke ([place]) 2000   "lost part of the vision in my right eye" (02/11/2018)  . Major depression, chronic   . Myocardial infarction ([place]) 06/2016  . Pacemaker   . Recurrent falls   . Syncope and collapse   . Type 2 diabetes, diet controlled [facility])     Patient Active Problem List   Diagnosis Date Noted  . Closed  fracture of left superior pubic ramus, initial encounter 10/18/2018  . Dehydration 08/18/2018  . Dizziness 07/09/2018  . Stroke due to embolism of carotid artery ([place]) 06/05/2018  . Stroke-like symptom   . Dyspnea   . Acute blood loss anemia   . Acute ischemic stroke ([place]) 05/31/2018  . Stroke ([place]) 05/30/2018  . Chronic diastolic congestive heart failure ([place])   . Anemia of chronic disease   . Hypoalbuminemia due to protein-calorie malnutrition ([place])   . Labile blood pressure   . Atrial fibrillation ([place])   . Chronic neck pain   . Subcortical infarction ([place]) 04/27/2018  . CVA (cerebral vascular accident) ([place]) 04/26/2018  . TIA (transient ischemic attack) 04/24/2018  . CAD (coronary artery disease) 08/07/2016  . (HFpEF) heart failure with preserved ejection fraction ([place]) 08/07/2016  . Ischemic cardiomyopathy 08/07/2016  . Chest pain   . NSTEMI (non-ST elevated myocardial infarction) ([place])   . Status post coronary artery stent placement   . Abnormal stress test   . SOB (shortness of breath)   . Multifocal pneumonia 07/13/2016  . Elevated troponin 07/13/2016  . Leukocytosis   . Pacemaker 05/17/2014  . Complications, pacemaker cardiac, mechanical 12/31/2012  . Edema extremities 10/08/2012  . Persistent atrial fibrillation ([place]) 10/08/2012  . Hypertension 10/08/2012  .  Hypothyroidism 10/08/2012  . Neuropathy 10/08/2012  . Diabetes ([place]) 10/08/2012  . Depression 09/08/2012  . Hypokalemia 08/08/2012  . Anemia 08/08/2012  . Knee fracture, left 08/07/2012  . Recurrent falls 08/07/2012  . Syncope 08/07/2012  . SDH (subdural hematoma) ([place]) 08/07/2012    Past Surgical History:  Procedure Laterality Date  . ABDOMINAL HYSTERECTOMY    . ANKLE FRACTURE SURGERY Right   . BASAL CELL CARCINOMA EXCISION Left    "leg"  . CARDIAC CATHETERIZATION  ~ 2014  . CARDIOVERSION N/A 02/13/2018   Procedure: CARDIOVERSION;  Surgeon: Sueanne Margarita, MD;  Location: [facility] ENDOSCOPY;  Service:  Cardiovascular;  Laterality: N/A;  . CARDIOVERSION N/A 03/27/2018   Procedure: CARDIOVERSION;  Surgeon: Lelon Perla, MD;  Location: [facility] ENDOSCOPY;  Service: Cardiovascular;  Laterality: N/A;  . CARPAL TUNNEL RELEASE     bilateral  . CATARACT EXTRACTION W/ INTRAOCULAR LENS  IMPLANT, BILATERAL Bilateral   . CHOLECYSTECTOMY OPEN    . COLONOSCOPY    . CORONARY ANGIOPLASTY WITH STENT PLACEMENT  06/2016  . CORONARY STENT INTERVENTION N/A 07/19/2016   Procedure: Coronary Stent Intervention;  Surgeon: Peter M Martinique, MD;  Location: [facility] CV LAB;  Service: Cardiovascular;  Laterality: N/A;  . ENDARTERECTOMY Right 06/03/2018   Procedure: ENDARTERECTOMY CAROTID;  Surgeon: Rosetta Posner, MD;  Location: [facility];  Service: Vascular;  Laterality: Right;  . EYE SURGERY     right eye catarace/lens implant  . FEMUR FRACTURE SURGERY Bilateral   . FRACTURE SURGERY    . GASTRIC BYPASS    . INSERT / REPLACE / REMOVE PACEMAKER  ~ 2012  . LEAD REVISION N/A 01/01/2013   Procedure: LEAD REVISION;  Surgeon: Evans Lance, MD;  Location[facility] CATH LAB;  Service: Cardiovascular;  Laterality: N/A;  . LEFT HEART CATH AND CORONARY ANGIOGRAPHY N/A 07/19/2016   Procedure: Left Heart Cath and Coronary Angiography;  Surgeon: Peter M Martinique, MD;  Location: [facility] CV LAB;  Service: Cardiovascular;  Laterality: N/A;  . OVARIAN CYST SURGERY     "one side only"  . PATCH ANGIOPLASTY Right 06/03/2018   Procedure: PATCH ANGIOPLASTY USING HEMASHIELD PLATINUM FINESSE;  Surgeon: Rosetta Posner, MD;  Location: [facility];  Service: Vascular;  Laterality: Right;  . TEE WITHOUT CARDIOVERSION N/A 02/13/2018   Procedure: TRANSESOPHAGEAL ECHOCARDIOGRAM (TEE);  Surgeon: Sueanne Margarita, MD;  Location[facility] ENDOSCOPY;  Service: Cardiovascular;  Laterality: N/A;  . TONSILLECTOMY       OB History   No obstetric history on file.      Home Medications    Prior to Admission medications   Medication Sig Start Date End Date Taking?  Authorizing Provider  acetaminophen (TYLENOL) 500 MG tablet Take 1,000 mg by mouth 3 (three) times daily.     [provider]  albuterol (PROVENTIL HFA;VENTOLIN HFA) 108 (90 Base) MCG/ACT inhaler Inhale 2 puffs into the lungs every 6 (six) hours as needed for wheezing or shortness of breath. 05/07/18   Angiulli, Lavon Paganini, PA-C  alum & mag hydroxide-simeth (MAALOX/MYLANTA) 200-200-20 MG/5ML suspension Take 20 mLs by mouth every 6 (six) hours as needed for indigestion or heartburn.    [provider]  amiodarone (PACERONE) 200 MG tablet Take 1 tablet (200 mg total) by mouth every morning. 05/07/18   Angiulli, Lavon Paganini, PA-C  apixaban (ELIQUIS) 2.5 MG TABS tablet Take 1 tablet (2.5 mg total) by mouth 2 (two) times daily. 07/13/18   Domenic Polite, MD  atorvastatin (LIPITOR) 20 MG tablet  Take 1 tablet (20 mg total) by mouth daily for 30 days. 06/05/18   Dessa Phi, DO  budesonide-formoterol [facility]) 160-4.5 MCG/ACT inhaler Inhale 2 puffs into the lungs 2 (two) times daily. 05/20/18   Mannam, Hart Robinsons, MD  busPIRone (BUSPAR) 7.5 MG tablet Take 7.5 mg by mouth 2 (two) times daily.    [provider]  calcitonin, salmon, (MIACALCIN/FORTICAL) 200 UNIT/ACT nasal spray 1 spray every other day. Left nare 08/04/18   [provider]  cetaphil (CETAPHIL) cream Apply 1 application topically 3 (three) times daily.    [provider]  diphenhydrAMINE (BENADRYL) 25 MG tablet Take 25 mg by mouth every 8 (eight) hours as needed for itching.    [provider]  docusate sodium (COLACE) 100 MG capsule Take 1 capsule (100 mg total) by mouth daily. 06/24/18   Love, Ivan Anchors, PA-C  ferrous sulfate 325 (65 FE) MG tablet Take 1 tablet (325 mg total) by mouth 2 (two) times daily with a meal. 05/07/18   Angiulli, Lavon Paganini, PA-C  flintstones complete (FLINTSTONES) 60 MG chewable tablet Chew 1 tablet by mouth daily.    [provider]  gabapentin (NEURONTIN) 300 MG  capsule Take 300 mg by mouth 2 (two) times daily.    [provider]  gabapentin (NEURONTIN) 300 MG capsule Take 600 mg by mouth at bedtime.    [provider]  hydrOXYzine (ATARAX/VISTARIL) 25 MG tablet Take 25 mg by mouth every 6 (six) hours as needed. Stop date 11/12/2018    [provider]  levothyroxine (SYNTHROID, LEVOTHROID) 75 MCG tablet Take 1 tablet (75 mcg total) by mouth daily at 6 (six) AM. 06/24/18   Love, Ivan Anchors, PA-C  Melatonin 5 MG TABS Take 5 mg by mouth at bedtime.    [provider]  nitroGLYCERIN (NITROSTAT) 0.4 MG SL tablet Place 1 tablet (0.4 mg total) under the tongue every 5 (five) minutes as needed for chest pain. 10/11/16   Fay Records, MD  ondansetron (ZOFRAN) 4 MG tablet Take 4 mg by mouth every 6 (six) hours as needed for nausea or vomiting.    [provider]  pantoprazole (PROTONIX) 40 MG tablet Take 1 tablet (40 mg total) by mouth daily. Patient taking differently: Take 40 mg by mouth 2 (two) times daily.  06/24/18   Love, Ivan Anchors, PA-C  sertraline (ZOLOFT) 50 MG tablet Take 50 mg by mouth daily.    [provider]  topiramate (TOPAMAX) 25 MG tablet Take 1 tablet (25 mg total) by mouth daily. 11/09/18   Frann Rider, NP    Family History Family History  Problem Relation Age of Onset  . Congestive Heart Failure Mother   . Heart attack Father   . Alcoholism Father   . Alcoholism Sister   . Alcoholism Brother   . Alcoholism Brother   . Throat cancer Brother     Social History Social History   Tobacco Use  . Smoking status: Former Smoker    Packs/day: 0.50    Years: 4.00    Pack years: 2.00    Types: Cigarettes    Quit date: 08/07/1972    Years since quitting: 46.4  . Smokeless tobacco: Never Used  Substance Use Topics  . Alcohol use: Yes    Frequency: Never    Comment: once or twice a year  . Drug use: Never     Allergies   Valium [diazepam], Robaxin [methocarbamol], Tramadol, Codeine,  Darifenacin, Darvon [propoxyphene hcl], Daypro [oxaprozin],  Enablex [darifenacin hydrobromide er], Oxycodone, Propoxyphene, Risperdal [risperidone], Talwin [pentazocine], and Vicodin [hydrocodone-acetaminophen]   Review of Systems Review of Systems  Constitutional: Negative for appetite change and fatigue.  HENT: Negative for congestion, ear discharge and sinus pressure.   Eyes: Negative for discharge.  Respiratory: Negative for cough.   Cardiovascular: Negative for chest pain.  Gastrointestinal: Positive for abdominal pain and vomiting. Negative for diarrhea.  Genitourinary: Negative for frequency and hematuria.  Musculoskeletal: Negative for back pain.  Skin: Negative for rash.  Neurological: Negative for seizures and headaches.  Psychiatric/Behavioral: Negative for hallucinations.     Physical Exam Updated Vital Signs BP (!) 162/69   Pulse 80   Temp 98.1 F (36.7 C) (Oral)   Resp 19   Ht _0  (1.6 m)   Wt 52.7 kg   SpO2 98%   BMI 20.58 kg/m   Physical Exam Vitals signs and nursing note reviewed.  Constitutional:      Appearance: She is well-developed.  HENT:     Head: Normocephalic.     Nose: Nose normal.  Eyes:     General: No scleral icterus.    Conjunctiva/sclera: Conjunctivae normal.  Neck:     Musculoskeletal: Neck supple.     Thyroid: No thyromegaly.  Cardiovascular:     Rate and Rhythm: Normal rate and regular rhythm.     Heart sounds: No murmur. No friction rub. No gallop.   Pulmonary:     Breath sounds: No stridor. No wheezing or rales.  Chest:     Chest wall: No tenderness.  Abdominal:     General: There is no distension.     Tenderness: There is abdominal tenderness. There is no rebound.  Musculoskeletal: Normal range of motion.  Lymphadenopathy:     Cervical: No cervical adenopathy.  Skin:    Findings: No erythema or rash.  Neurological:     Mental Status: She is oriented to person, place, and time.     Motor: No abnormal muscle tone.      Coordination: Coordination normal.  Psychiatric:        Behavior: Behavior normal.      ED Treatments / Results  Labs (all labs ordered are listed, but only abnormal results are displayed) Labs Reviewed  CBC WITH DIFFERENTIAL/PLATELET - Abnormal; Notable for the following components:      Result Value   MCV 103.6 (*)    MCH 34.1 (*)    Neutro Abs 8.1 (*)    Lymphs Abs 0.4 (*)    All other components within normal limits  COMPREHENSIVE METABOLIC PANEL - Abnormal; Notable for the following components:   Potassium 3.3 (*)    CO2 18 (*)    Glucose, Bld 133 (*)    Calcium 8.3 (*)    Albumin 3.0 (*)    Total Bilirubin 1.5 (*)    All other components within normal limits  LIPASE, BLOOD  URINALYSIS, ROUTINE W REFLEX MICROSCOPIC    EKG None  Radiology Ct Abdomen Pelvis W Contrast  Result Date: 12/31/2018 CLINICAL DATA:  Acute lower abdominal pain. EXAM: CT ABDOMEN AND PELVIS WITH CONTRAST TECHNIQUE: Multidetector CT imaging of the abdomen and pelvis was performed using the standard protocol following bolus administration of intravenous contrast. CONTRAST:  25m OMNIPAQUE IOHEXOL 300 MG/ML  SOLN COMPARISON:  CT scan of Aug 17, 2018. FINDINGS: Lower chest: No acute abnormality. Hepatobiliary: Status post cholecystectomy. Mild intrahepatic and extrahepatic biliary dilatation is noted most likely due to post cholecystectomy status. No focal hepatic  abnormality is noted. Pancreas: Unremarkable. No pancreatic ductal dilatation or surrounding inflammatory changes. Spleen: Normal in size without focal abnormality. Adrenals/Urinary Tract: Small nonobstructive left renal calculus is noted. No hydronephrosis or renal obstruction is noted. Small left renal cyst is noted. Urinary bladder is unremarkable. Stomach/Bowel: Postsurgical changes are seen involving the stomach. There is no evidence of bowel obstruction or inflammation. The appendix appears normal. Postsurgical changes are seen involving the  small bowel in the left upper quadrant. Sigmoid diverticulosis is noted without inflammation. Vascular/Lymphatic: Aortic atherosclerosis. No enlarged abdominal or pelvic lymph nodes. IVC filter is noted in infrarenal position. Reproductive: Status post hysterectomy. No adnexal masses. Other: No abdominal wall hernia or abnormality. No abdominopelvic ascites. Musculoskeletal: Since the prior exam, there is interval development of subacute or chronic fractures involving the superior aspect of the left acetabulum as well as the left superior pubic ramus. Status post surgical internal fixation of both proximal femurs. IMPRESSION: Probable small nonobstructive left renal calculus. No hydronephrosis or renal obstruction is noted. Sigmoid diverticulosis is noted without inflammation. There has been interval development of subacute or chronic fractures involving the superior aspect of the left acetabulum as well as the left superior pubic ramus. Aortic Atherosclerosis (ICD10-I70.0). Electronically Signed   By: Marijo Conception M.D.   On: 12/31/2018 14:30    Procedures Procedures (including critical care time)  Medications Ordered in ED Medications  ondansetron (ZOFRAN) injection 4 mg (has no administration in time range)  sodium chloride 0.9 % bolus 500 mL (0 mLs Intravenous Stopped 12/31/18 1413)  iohexol (OMNIPAQUE) 300 MG/ML solution 100 mL (80 mLs Intravenous Contrast Given 12/31/18 1344)     Initial Impression / Assessment and Plan / ED Course  I have reviewed the triage vital signs and the nursing notes.  Pertinent labs & imaging results that were available during my care of the patient were reviewed by me and considered in my medical decision making (see chart for details).   Blood tests and CT scan unremarkable.  Patient given fluids and nausea medicine and seems improved.  Urinalysis still pending Patient's urinalysis normal.  She will be discharged home with some Zofran for the nausea and  follow-up as needed Final Clinical Impressions(s) / ED Diagnoses   Final diagnoses:  None    ED Discharge Orders    None       Milton Ferguson, MD 12/31/18 [phone]

## 2018-12-31 NOTE — ED Notes (Signed)
Pt ambulated with walker gait steady pt stated she felt fine.

## 2018-12-31 NOTE — ED Notes (Signed)
Updated pt's son Lennette Bihari) in regards to a social work consult for home health.

## 2018-12-31 NOTE — ED Notes (Signed)
Patient verbalizes understanding of discharge instructions. Opportunity for questioning and answers were provided. Armband removed by staff, pt discharged from ED.  

## 2018-12-31 NOTE — ED Provider Notes (Signed)
Pt's son was concerned about pt going home, so I consulted SW/CM.  They were able to set up increased services with home health.  Son and pt agreeable.    Isla Pence, MD 12/31/18 [phone]

## 2018-12-31 NOTE — ED Triage Notes (Signed)
Pt From home via EMS with N/V and HA that started yesterday.

## 2018-12-31 NOTE — Discharge Instructions (Addendum)
Drink plenty of fluids and follow up if not improving. °

## 2018-12-31 NOTE — ED Notes (Signed)
Patient ambulated in hallway with walker and assistance and did great. Called son, Lennette Bihari, he will be picking her up in a few minutes. Patient was offered food once again but does not seem interested.

## 2018-12-31 NOTE — Progress Notes (Signed)
Consult request has been received. CSW attempting to follow up at present time  Tavaria Mackins M. Pennie Vanblarcom LCSWA Transitions of Care  Clinical Social Worker  Ph: [phone] 

## 2019-01-02 ENCOUNTER — Encounter (HOSPITAL_COMMUNITY): Payer: Self-pay

## 2019-01-02 ENCOUNTER — Other Ambulatory Visit: Payer: Self-pay

## 2019-01-02 ENCOUNTER — Emergency Department (HOSPITAL_COMMUNITY): Payer: Medicare Other

## 2019-01-02 ENCOUNTER — Telehealth: Payer: Self-pay | Admitting: Licensed Clinical Social Worker

## 2019-01-02 ENCOUNTER — Inpatient Hospital Stay (HOSPITAL_COMMUNITY)
Admission: EM | Admit: 2019-01-02 | Discharge: 2019-01-05 | DRG: 884 | Disposition: A | Discharge status: Skilled Nursing Facility | Payer: Medicare Other | Attending: Internal Medicine | Admitting: Internal Medicine

## 2019-01-02 DIAGNOSIS — F0390 Unspecified dementia without behavioral disturbance: Secondary | ICD-10-CM | POA: Diagnosis not present

## 2019-01-02 DIAGNOSIS — E46 Unspecified protein-calorie malnutrition: Secondary | ICD-10-CM | POA: Diagnosis present

## 2019-01-02 DIAGNOSIS — Z87891 Personal history of nicotine dependence: Secondary | ICD-10-CM

## 2019-01-02 DIAGNOSIS — I5032 Chronic diastolic (congestive) heart failure: Secondary | ICD-10-CM | POA: Diagnosis present

## 2019-01-02 DIAGNOSIS — N390 Urinary tract infection, site not specified: Secondary | ICD-10-CM

## 2019-01-02 DIAGNOSIS — I110 Hypertensive heart disease with heart failure: Secondary | ICD-10-CM | POA: Diagnosis present

## 2019-01-02 DIAGNOSIS — R1013 Epigastric pain: Secondary | ICD-10-CM | POA: Diagnosis not present

## 2019-01-02 DIAGNOSIS — E1142 Type 2 diabetes mellitus with diabetic polyneuropathy: Secondary | ICD-10-CM | POA: Diagnosis present

## 2019-01-02 DIAGNOSIS — F419 Anxiety disorder, unspecified: Secondary | ICD-10-CM | POA: Diagnosis present

## 2019-01-02 DIAGNOSIS — Z885 Allergy status to narcotic agent status: Secondary | ICD-10-CM

## 2019-01-02 DIAGNOSIS — Z7951 Long term (current) use of inhaled steroids: Secondary | ICD-10-CM

## 2019-01-02 DIAGNOSIS — M159 Polyosteoarthritis, unspecified: Secondary | ICD-10-CM | POA: Diagnosis present

## 2019-01-02 DIAGNOSIS — I252 Old myocardial infarction: Secondary | ICD-10-CM

## 2019-01-02 DIAGNOSIS — F329 Major depressive disorder, single episode, unspecified: Secondary | ICD-10-CM | POA: Diagnosis present

## 2019-01-02 DIAGNOSIS — J42 Unspecified chronic bronchitis: Secondary | ICD-10-CM | POA: Diagnosis present

## 2019-01-02 DIAGNOSIS — I452 Bifascicular block: Secondary | ICD-10-CM | POA: Diagnosis present

## 2019-01-02 DIAGNOSIS — Z8673 Personal history of transient ischemic attack (TIA), and cerebral infarction without residual deficits: Secondary | ICD-10-CM

## 2019-01-02 DIAGNOSIS — Z888 Allergy status to other drugs, medicaments and biological substances status: Secondary | ICD-10-CM

## 2019-01-02 DIAGNOSIS — Z20828 Contact with and (suspected) exposure to other viral communicable diseases: Secondary | ICD-10-CM | POA: Diagnosis present

## 2019-01-02 DIAGNOSIS — I10 Essential (primary) hypertension: Secondary | ICD-10-CM | POA: Diagnosis present

## 2019-01-02 DIAGNOSIS — Z6820 Body mass index (BMI) 20.0-20.9, adult: Secondary | ICD-10-CM

## 2019-01-02 DIAGNOSIS — Z811 Family history of alcohol abuse and dependence: Secondary | ICD-10-CM

## 2019-01-02 DIAGNOSIS — K219 Gastro-esophageal reflux disease without esophagitis: Secondary | ICD-10-CM | POA: Diagnosis present

## 2019-01-02 DIAGNOSIS — Z8042 Family history of malignant neoplasm of prostate: Secondary | ICD-10-CM

## 2019-01-02 DIAGNOSIS — I2510 Atherosclerotic heart disease of native coronary artery without angina pectoris: Secondary | ICD-10-CM | POA: Diagnosis present

## 2019-01-02 DIAGNOSIS — F32A Depression, unspecified: Secondary | ICD-10-CM | POA: Diagnosis present

## 2019-01-02 DIAGNOSIS — E876 Hypokalemia: Secondary | ICD-10-CM | POA: Diagnosis present

## 2019-01-02 DIAGNOSIS — Z7901 Long term (current) use of anticoagulants: Secondary | ICD-10-CM

## 2019-01-02 DIAGNOSIS — I4819 Other persistent atrial fibrillation: Secondary | ICD-10-CM | POA: Diagnosis present

## 2019-01-02 DIAGNOSIS — E039 Hypothyroidism, unspecified: Secondary | ICD-10-CM | POA: Diagnosis present

## 2019-01-02 DIAGNOSIS — Z808 Family history of malignant neoplasm of other organs or systems: Secondary | ICD-10-CM

## 2019-01-02 DIAGNOSIS — Z85828 Personal history of other malignant neoplasm of skin: Secondary | ICD-10-CM

## 2019-01-02 DIAGNOSIS — Z79890 Hormone replacement therapy (postmenopausal): Secondary | ICD-10-CM

## 2019-01-02 DIAGNOSIS — R296 Repeated falls: Secondary | ICD-10-CM | POA: Diagnosis present

## 2019-01-02 DIAGNOSIS — Z66 Do not resuscitate: Secondary | ICD-10-CM | POA: Diagnosis present

## 2019-01-02 DIAGNOSIS — R1110 Vomiting, unspecified: Secondary | ICD-10-CM | POA: Diagnosis present

## 2019-01-02 DIAGNOSIS — Z8249 Family history of ischemic heart disease and other diseases of the circulatory system: Secondary | ICD-10-CM

## 2019-01-02 DIAGNOSIS — R1310 Dysphagia, unspecified: Secondary | ICD-10-CM | POA: Diagnosis present

## 2019-01-02 DIAGNOSIS — R627 Adult failure to thrive: Secondary | ICD-10-CM

## 2019-01-02 DIAGNOSIS — Z950 Presence of cardiac pacemaker: Secondary | ICD-10-CM

## 2019-01-02 DIAGNOSIS — D539 Nutritional anemia, unspecified: Secondary | ICD-10-CM | POA: Diagnosis present

## 2019-01-02 DIAGNOSIS — R634 Abnormal weight loss: Secondary | ICD-10-CM

## 2019-01-02 LAB — CBC WITH DIFFERENTIAL/PLATELET
Abs Immature Granulocytes: 0.03 10*3/uL (ref 0.00–0.07)
Basophils Absolute: 0 10*3/uL (ref 0.0–0.1)
Basophils Relative: 0 %
Eosinophils Absolute: 0 10*3/uL (ref 0.0–0.5)
Eosinophils Relative: 0 %
HCT: 39 % (ref 36.0–46.0)
Hemoglobin: 12.2 g/dL (ref 12.0–15.0)
Immature Granulocytes: 0 %
Lymphocytes Relative: 12 %
Lymphs Abs: 1 10*3/uL (ref 0.7–4.0)
MCH: 33.2 pg (ref 26.0–34.0)
MCHC: 31.3 g/dL (ref 30.0–36.0)
MCV: 106.3 fL — ABNORMAL HIGH (ref 80.0–100.0)
Monocytes Absolute: 0.6 10*3/uL (ref 0.1–1.0)
Monocytes Relative: 7 %
Neutro Abs: 6.8 10*3/uL (ref 1.7–7.7)
Neutrophils Relative %: 81 %
Platelets: 241 10*3/uL (ref 150–400)
RBC: 3.67 MIL/uL — ABNORMAL LOW (ref 3.87–5.11)
RDW: 13.2 % (ref 11.5–15.5)
WBC: 8.4 10*3/uL (ref 4.0–10.5)
nRBC: 0 % (ref 0.0–0.2)

## 2019-01-02 LAB — COMPREHENSIVE METABOLIC PANEL
ALT: 37 U/L (ref 0–44)
AST: 105 U/L — ABNORMAL HIGH (ref 15–41)
Albumin: 2.7 g/dL — ABNORMAL LOW (ref 3.5–5.0)
Alkaline Phosphatase: 87 U/L (ref 38–126)
Anion gap: 9 (ref 5–15)
BUN: 14 mg/dL (ref 8–23)
CO2: 22 mmol/L (ref 22–32)
Calcium: 8.1 mg/dL — ABNORMAL LOW (ref 8.9–10.3)
Chloride: 107 mmol/L (ref 98–111)
Creatinine, Ser: 0.74 mg/dL (ref 0.44–1.00)
GFR calc Af Amer: >60 mL/min (ref >60)
GFR calc non Af Amer: >60 mL/min (ref >60)
Glucose, Bld: 96 mg/dL (ref 70–99)
Potassium: 3.4 mmol/L — ABNORMAL LOW (ref 3.5–5.1)
Sodium: 138 mmol/L (ref 135–145)
Total Bilirubin: 0.8 mg/dL (ref 0.3–1.2)
Total Protein: 6.3 g/dL — ABNORMAL LOW (ref 6.5–8.1)

## 2019-01-02 LAB — URINALYSIS, ROUTINE W REFLEX MICROSCOPIC
Bacteria, UA: NONE SEEN
Bilirubin Urine: NEGATIVE
Glucose, UA: NEGATIVE mg/dL
Ketones, ur: 20 mg/dL — AB
Nitrite: NEGATIVE
Protein, ur: 30 mg/dL — AB
Specific Gravity, Urine: 1.02 (ref 1.005–1.030)
pH: 6 (ref 5.0–8.0)

## 2019-01-02 LAB — LIPASE, BLOOD: Lipase: 30 U/L (ref 11–51)

## 2019-01-02 MED ORDER — ONDANSETRON HCL 4 MG/2ML IJ SOLN
4.0000 mg | Freq: Once | INTRAMUSCULAR | Status: DC
  Filled 2019-01-02: qty 2

## 2019-01-02 MED ORDER — BUSPIRONE HCL 15 MG PO TABS
7.5000 mg | ORAL_TABLET | Freq: Two times a day (BID) | ORAL | Status: DC
  Administered 2019-01-03 – 2019-01-05 (×6): 7.5 mg via ORAL
  Filled 2019-01-02 (×7): qty 1

## 2019-01-02 MED ORDER — MELATONIN 3 MG PO TABS
3.0000 mg | ORAL_TABLET | Freq: Every day | ORAL | Status: DC
  Administered 2019-01-03 – 2019-01-04 (×3): 3 mg via ORAL
  Filled 2019-01-02 (×4): qty 1

## 2019-01-02 MED ORDER — ENOXAPARIN SODIUM 40 MG/0.4ML ~~LOC~~ SOLN: 40.0000 mg | SUBCUTANEOUS | Status: DC

## 2019-01-02 MED ORDER — SERTRALINE HCL 50 MG PO TABS
50.0000 mg | ORAL_TABLET | Freq: Every day | ORAL | Status: DC
  Administered 2019-01-03: 50 mg via ORAL
  Filled 2019-01-02: qty 1

## 2019-01-02 MED ORDER — APIXABAN 2.5 MG PO TABS
2.5000 mg | ORAL_TABLET | Freq: Two times a day (BID) | ORAL | Status: DC
  Administered 2019-01-03 – 2019-01-05 (×6): 2.5 mg via ORAL
  Filled 2019-01-02 (×6): qty 1

## 2019-01-02 MED ORDER — SODIUM CHLORIDE 0.9 % IV SOLN
INTRAVENOUS | Status: DC
  Administered 2019-01-02 – 2019-01-05 (×3): via INTRAVENOUS

## 2019-01-02 MED ORDER — MOMETASONE FURO-FORMOTEROL FUM 200-5 MCG/ACT IN AERO
2.0000 | INHALATION_SPRAY | Freq: Two times a day (BID) | RESPIRATORY_TRACT | Status: DC
  Administered 2019-01-03 – 2019-01-04 (×4): 2 via RESPIRATORY_TRACT
  Filled 2019-01-02: qty 8.8

## 2019-01-02 MED ORDER — GABAPENTIN 300 MG PO CAPS
300.0000 mg | ORAL_CAPSULE | Freq: Two times a day (BID) | ORAL | Status: DC
  Administered 2019-01-03 – 2019-01-04 (×4): 300 mg via ORAL
  Filled 2019-01-02 (×4): qty 1

## 2019-01-02 MED ORDER — ALUM & MAG HYDROXIDE-SIMETH 200-200-20 MG/5ML PO SUSP
20.0000 mL | Freq: Four times a day (QID) | ORAL | Status: DC | PRN
  Administered 2019-01-04: 20 mL via ORAL
  Filled 2019-01-02: qty 30

## 2019-01-02 MED ORDER — ATORVASTATIN CALCIUM 10 MG PO TABS
20.0000 mg | ORAL_TABLET | Freq: Every day | ORAL | Status: DC
  Administered 2019-01-03 – 2019-01-05 (×3): 20 mg via ORAL
  Filled 2019-01-02 (×3): qty 2

## 2019-01-02 MED ORDER — FENTANYL CITRATE (PF) 100 MCG/2ML IJ SOLN
50.0000 ug | Freq: Once | INTRAMUSCULAR | Status: AC
  Administered 2019-01-02: 50 ug via INTRAVENOUS
  Filled 2019-01-02: qty 2

## 2019-01-02 MED ORDER — ALUM & MAG HYDROXIDE-SIMETH 200-200-20 MG/5ML PO SUSP
30.0000 mL | Freq: Once | ORAL | Status: AC
  Administered 2019-01-02: 30 mL via ORAL
  Filled 2019-01-02: qty 30

## 2019-01-02 MED ORDER — POTASSIUM CHLORIDE CRYS ER 20 MEQ PO TBCR
40.0000 meq | EXTENDED_RELEASE_TABLET | Freq: Once | ORAL | Status: AC
  Administered 2019-01-02: 40 meq via ORAL
  Filled 2019-01-02: qty 2

## 2019-01-02 MED ORDER — AMIODARONE HCL 200 MG PO TABS
200.0000 mg | ORAL_TABLET | Freq: Every day | ORAL | Status: DC
  Administered 2019-01-03 – 2019-01-05 (×3): 200 mg via ORAL
  Filled 2019-01-02 (×3): qty 1

## 2019-01-02 MED ORDER — GABAPENTIN 300 MG PO CAPS
600.0000 mg | ORAL_CAPSULE | Freq: Every day | ORAL | Status: DC
  Administered 2019-01-03 – 2019-01-04 (×2): 600 mg via ORAL
  Filled 2019-01-02 (×2): qty 2

## 2019-01-02 MED ORDER — SODIUM CHLORIDE 0.9 % IV BOLUS
1000.0000 mL | Freq: Once | INTRAVENOUS | Status: AC
  Administered 2019-01-02: 1000 mL via INTRAVENOUS

## 2019-01-02 MED ORDER — DICYCLOMINE HCL 10 MG/5ML PO SOLN
10.0000 mg | Freq: Once | ORAL | Status: AC
  Administered 2019-01-03: 10 mg via ORAL
  Filled 2019-01-02: qty 5

## 2019-01-02 MED ORDER — CEPHALEXIN 250 MG PO CAPS
500.0000 mg | ORAL_CAPSULE | Freq: Once | ORAL | Status: AC
  Administered 2019-01-02: 21:00:00 500 mg via ORAL
  Filled 2019-01-02: qty 2

## 2019-01-02 MED ORDER — PROMETHAZINE HCL 25 MG PO TABS
12.5000 mg | ORAL_TABLET | Freq: Four times a day (QID) | ORAL | Status: DC | PRN
  Administered 2019-01-03 – 2019-01-05 (×2): 12.5 mg via ORAL
  Filled 2019-01-02 (×2): qty 1

## 2019-01-02 MED ORDER — TOPIRAMATE 25 MG PO TABS
25.0000 mg | ORAL_TABLET | Freq: Every day | ORAL | Status: DC
  Filled 2019-01-02: qty 1

## 2019-01-02 MED ORDER — ALBUTEROL SULFATE (2.5 MG/3ML) 0.083% IN NEBU: 2.5000 mg | INHALATION_SOLUTION | Freq: Four times a day (QID) | RESPIRATORY_TRACT | Status: DC | PRN

## 2019-01-02 MED ORDER — PROMETHAZINE HCL 25 MG/ML IJ SOLN
12.5000 mg | Freq: Once | INTRAMUSCULAR | Status: AC
  Administered 2019-01-02: 12.5 mg via INTRAVENOUS
  Filled 2019-01-02: qty 1

## 2019-01-02 MED ORDER — LEVOTHYROXINE SODIUM 75 MCG PO TABS
75.0000 ug | ORAL_TABLET | Freq: Every day | ORAL | Status: DC
  Administered 2019-01-03 – 2019-01-05 (×3): 75 ug via ORAL
  Filled 2019-01-02 (×3): qty 1

## 2019-01-02 MED ORDER — PANTOPRAZOLE SODIUM 40 MG PO TBEC
40.0000 mg | DELAYED_RELEASE_TABLET | Freq: Two times a day (BID) | ORAL | Status: DC
  Administered 2019-01-03 – 2019-01-05 (×6): 40 mg via ORAL
  Filled 2019-01-02 (×6): qty 1

## 2019-01-02 MED ORDER — SENNOSIDES-DOCUSATE SODIUM 8.6-50 MG PO TABS: 1.0000 | ORAL_TABLET | Freq: Every evening | ORAL | Status: DC | PRN

## 2019-01-02 NOTE — ED Notes (Signed)
Per son Lennette Bihari, pt has had walking and weakness for past weak. Hx stroke x3 this year   Son reports pt current Disoriented to time, confusion new worsen past week.

## 2019-01-02 NOTE — ED Notes (Signed)
Pt able to tolerate PO fluids.  

## 2019-01-02 NOTE — Telephone Encounter (Signed)
Patient family called regarding patient's recewnt ED visit and not having had the follow up from [facility] that they were informed of. CSW spoke with [facility] represenative and was informed they had scheduled a nurse to see patient today. CSW provided updated contact information to [facility] for the family. CSW informed family of plan for [facility] to see patient today.

## 2019-01-02 NOTE — H&P (Addendum)
Date: 01/02/2019               Patient Name:  Meredith Gonzalez MRN: [id number]  DOB: 06/09/1935 Age / Sex: 83 y.o., female   PCP: Meredith Ship, MD         Medical Service: Internal Medicine Teaching Service         Attending Physician: Dr. Velna Ochs, MD    First Contact: Dr. Darrick Gonzalez Pager: [phone]  Second Contact: Dr. Sharon Gonzalez Pager: [phone]       After Hours (After 5p/  First Contact Pager: [phone]  weekends / holidays): Second Contact Pager: [phone]   Chief Complaint: Nausea, vomiting  History of Present Illness:   Meredith Gonzalez is an 83 y.o female with atrial fibrillation, CAD, HFpEF, hypothyroidism, HTN, MDD, DM2 who presented with 3 day history of nausea, vomiting, and difficulty getting anything down. Patient states that she has an achy epigastric abdominal pain that has been present for the past few months. No change with po intake. She denies nsaid use.  Patient reports a prior hx of N/V around 6 weeks ago that resolved on its own. Patient states that she has noticed that she has been slurring her speech but that her son hasn't noticed a difference in her speech. She also reports some left sided weakness over the last 6 weeks without numbness, vision changes or falls. She reports headaches since a fall one year prior after which she broke her neck. She denies fevers, chills, dysuria, urinary frequency, diarrhea and hematochezia. She reports her stools are black secondary to taking iron pills. She reports some sx of depression around the time her husband, son and three dogs passed away within 6 months of each other five years ago but reports an improvement since then.   Meds:  Current Meds  Medication Sig   apixaban (ELIQUIS) 2.5 MG TABS tablet Take 1 tablet (2.5 mg total) by mouth 2 (two) times daily.   Allergies: Allergies as of 01/02/2019 - Review Complete 12/31/2018  Allergen Reaction Noted   Valium [diazepam] Anxiety 08/07/2012   Robaxin [methocarbamol] Other  (See Comments) 07/10/2018   Tramadol Other (See Comments) 06/06/2018   Codeine Hives and Itching 08/07/2012   Darifenacin Itching 08/18/2015   Darvon [propoxyphene hcl] Itching 08/12/2012   Daypro [oxaprozin] Itching 08/12/2012   Enablex [darifenacin hydrobromide er] Itching 08/12/2012   Oxycodone Itching 08/12/2012   Propoxyphene Itching 08/18/2015   Risperdal [risperidone] Itching 08/12/2012   Talwin [pentazocine] Itching 08/12/2012   Vicodin [hydrocodone-acetaminophen] Itching 08/12/2012   Past Medical History:  Diagnosis Date   Anemia    Anxiety    Arthritis    "all over" (02/11/2018)   Basal cell carcinoma    "left leg" (02/11/2018)   Cervical spine fracture ([place]) 12/2017   "C1-2"   CHF (congestive heart failure) (HCC)    Chronic bronchitis (HCC)    Chronic neck pain    "since I broke my neck 6-8 wk ago" (02/11/2018)   Diabetic peripheral neuropathy (HCC)    Fibromyalgia    Fracture of right humerus    Generalized weakness    Headache    "weekly" (02/11/2018)   History of blood transfusion    "related to one of my femur surgeries" (02/11/2018)   History of echocardiogram    Echo 8/18: EF 50-55, no RWMA, Gr 1 DD, calcified AV leaflets, MAC, trivial MR, mod LAE, PASP 37   Hypertension    Hypothyroidism    Incontinence of urine  Ischemic stroke ([place]) 2000   "lost part of the vision in my right eye" (02/11/2018)   Major depression, chronic    Myocardial infarction ([place]) 06/2016   Pacemaker    Recurrent falls    Syncope and collapse    Type 2 diabetes, diet controlled ([place])    Family History:  Father: prostate cancer   Social History:  Lives with her son and daughter in law  Walks around with a walker Does not drink or smoke  Review of Systems: A complete ROS was negative except as per HPI.   Denies numbness, vision changes   Physical Exam: Blood pressure (!) 191/73, pulse 69, temperature 98.6 F (37 C), temperature  source Oral, resp. rate 15, height _0  (1.6 m), weight 52.7 kg, SpO2 100 %.  Physical Exam  Constitutional: No distress.  Thin elderly lady  HENT:  Head: Normocephalic.  Eyes: EOM are normal.  Neck: Normal range of motion.  Cardiovascular: Normal rate, regular rhythm and normal heart sounds.  No murmur heard. Pulmonary/Chest: Effort normal.  Basilar crackles  Abdominal: Soft. She exhibits no distension. There is abdominal tenderness (epigastrium).  Musculoskeletal: Normal range of motion.        General: No deformity or edema.  Neurological: She is alert. No cranial nerve deficit. Coordination normal.  Only oriented to self and location; symmetric strength and sensation  Skin: Skin is warm and dry. She is not diaphoretic. No erythema.  Psychiatric:  Blunted affect  Nursing note and vitals reviewed.  Results for orders placed or performed during the hospital encounter of 01/02/19 (from the past 24 hour(s))  Comprehensive metabolic panel     Status: Abnormal   Collection Time: 01/02/19  6:08 PM  Result Value Ref Range   Sodium 138 135 - 145 mmol/L   Potassium 3.4 (L) 3.5 - 5.1 mmol/L   Chloride 107 98 - 111 mmol/L   CO2 22 22 - 32 mmol/L   Glucose, Bld 96 70 - 99 mg/dL   BUN 14 8 - 23 mg/dL   Creatinine, Ser 0.74 0.44 - 1.00 mg/dL   Calcium 8.1 (L) 8.9 - 10.3 mg/dL   Total Protein 6.3 (L) 6.5 - 8.1 g/dL   Albumin 2.7 (L) 3.5 - 5.0 g/dL   AST 105 (H) 15 - 41 U/L   ALT 37 0 - 44 U/L   Alkaline Phosphatase 87 38 - 126 U/L   Total Bilirubin 0.8 0.3 - 1.2 mg/dL   GFR calc non Af Amer >60 >60 mL/min   GFR calc Af Amer >60 >60 mL/min   Anion gap 9 5 - 15  Lipase, blood     Status: None   Collection Time: 01/02/19  6:08 PM  Result Value Ref Range   Lipase 30 11 - 51 U/L  CBC with Differential     Status: Abnormal   Collection Time: 01/02/19  6:08 PM  Result Value Ref Range   WBC 8.4 4.0 - 10.5 K/uL   RBC 3.67 (L) 3.87 - 5.11 MIL/uL   Hemoglobin 12.2 12.0 - 15.0 g/dL    HCT 39.0 36.0 - 46.0 %   MCV 106.3 (H) 80.0 - 100.0 fL   MCH 33.2 26.0 - 34.0 pg   MCHC 31.3 30.0 - 36.0 g/dL   RDW 13.2 11.5 - 15.5 %   Platelets 241 150 - 400 K/uL   nRBC 0.0 0.0 - 0.2 %   Neutrophils Relative % 81 %   Neutro Abs 6.8 1.7 - 7.7  K/uL   Lymphocytes Relative 12 %   Lymphs Abs 1.0 0.7 - 4.0 K/uL   Monocytes Relative 7 %   Monocytes Absolute 0.6 0.1 - 1.0 K/uL   Eosinophils Relative 0 %   Eosinophils Absolute 0.0 0.0 - 0.5 K/uL   Basophils Relative 0 %   Basophils Absolute 0.0 0.0 - 0.1 K/uL   Immature Granulocytes 0 %   Abs Immature Granulocytes 0.03 0.00 - 0.07 K/uL  Urinalysis, Routine w reflex microscopic     Status: Abnormal   Collection Time: 01/02/19  6:28 PM  Result Value Ref Range   Color, Urine YELLOW YELLOW   APPearance CLEAR CLEAR   Specific Gravity, Urine 1.020 1.005 - 1.030   pH 6.0 5.0 - 8.0   Glucose, UA NEGATIVE NEGATIVE mg/dL   Hgb urine dipstick MODERATE (A) NEGATIVE   Bilirubin Urine NEGATIVE NEGATIVE   Ketones, ur 20 (A) NEGATIVE mg/dL   Protein, ur 30 (A) NEGATIVE mg/dL   Nitrite NEGATIVE NEGATIVE   Leukocytes,Ua SMALL (A) NEGATIVE   RBC / HPF 0-5 0 - 5 RBC/hpf   WBC, UA 11-20 0 - 5 WBC/hpf   Bacteria, UA NONE SEEN NONE SEEN   Squamous Epithelial / LPF 0-5 0 - 5   Mucus PRESENT   SARS CORONAVIRUS 2 (TAT 6-24 HRS) Nasopharyngeal Nasopharyngeal Swab     Status: None   Collection Time: 01/02/19 10:16 PM   Specimen: Nasopharyngeal Swab  Result Value Ref Range   SARS Coronavirus 2 NEGATIVE NEGATIVE  CBC     Status: Abnormal   Collection Time: 01/03/19  3:12 AM  Result Value Ref Range   WBC 8.3 4.0 - 10.5 K/uL   RBC 3.54 (L) 3.87 - 5.11 MIL/uL   Hemoglobin 11.7 (L) 12.0 - 15.0 g/dL   HCT 37.3 36.0 - 46.0 %   MCV 105.4 (H) 80.0 - 100.0 fL   MCH 33.1 26.0 - 34.0 pg   MCHC 31.4 30.0 - 36.0 g/dL   RDW 13.2 11.5 - 15.5 %   Platelets 245 150 - 400 K/uL   nRBC 0.0 0.0 - 0.2 %  Comprehensive metabolic panel     Status: Abnormal    Collection Time: 01/03/19  3:12 AM  Result Value Ref Range   Sodium 139 135 - 145 mmol/L   Potassium 4.4 3.5 - 5.1 mmol/L   Chloride 107 98 - 111 mmol/L   CO2 23 22 - 32 mmol/L   Glucose, Bld 83 70 - 99 mg/dL   BUN 8 8 - 23 mg/dL   Creatinine, Ser 0.67 0.44 - 1.00 mg/dL   Calcium 7.8 (L) 8.9 - 10.3 mg/dL   Total Protein 5.7 (L) 6.5 - 8.1 g/dL   Albumin 2.5 (L) 3.5 - 5.0 g/dL   AST 111 (H) 15 - 41 U/L   ALT 38 0 - 44 U/L   Alkaline Phosphatase 86 38 - 126 U/L   Total Bilirubin 0.8 0.3 - 1.2 mg/dL   GFR calc non Af Amer >60 >60 mL/min   GFR calc Af Amer >60 >60 mL/min   Anion gap 9 5 - 15     EKG: personally reviewed my interpretation is: Sinus rhythm with RBBB, LAFB and probable LVH  CDG ABD ACUTE 2+ V W 1V CHEST:  1. Nonobstructive bowel gas pattern. 2. No acute cardiopulmonary disease. 3. Enlarged cardiac silhouette.  Assessment & Plan by Problem:  Ms. Robley is an 83 y.o female with atrial fibrillation, hx of recurrent strokes/TIA, CAD, HFpEF,  hypothyroidism, HTN, MDD, DM2 who presented with 3 day history of nausea, vomiting, and difficulty getting anything down.   Active Problems:   Vomiting -pt w/ 3 day hx of N/V with associated epigastric abdominal pain; she reports laying down helps and getting up makes her sx worse; she reports a prior hx of similar sx around 6 weeks ago which resolved on their own; patient was seen in the ED with these sx 2 days prior and treated w/ fluids and nausea medicine with a prescription for Zofran to take home; patient reports zofran does not improve her sx; pt denies diarrhea, hematochezia; she reports dark stools secondary to oral iron supplementation; sx do not correlate w/ eating and patient has had her gallbladder removed; she denies radiation of the pain to her back; she is tender to palpation in the epigastrium on exam -unclear etiology at this time; blood tests and CT scan 2 days ago were normal and labs and imaging today are similarly so; CT  and lipase unremarkable so unlikely pancreatitis; no diarrhea and abdominal pain not severe and given this happened 6 weeks ago as well this is unlikely a gastroenteritis; not gallbladder related as she has had that removed; bowel obstruction unlikely given imaging results; could be peptic ulcer disease but patient already on protonix; could be medication side effect as patient recently started taking topamax 6 weeks ago, around the time her sx started, which has a known side effect of nausea and abdominal pain; she's also on amiodarone which has nausea as a side effect although that medication isn't new; may also be functional or psychogenic as the patient has a hx of post stroke depression and exhibited a blunted affect on exam without findings on imaging and lab work; cyclic vomiting a possibility as the patient has history of headaches and the episodes are discreet but diagnosis requires three discreet episodes  -follow up sx -replete electrolytes as needed -provide fluids as needed  Atrial fibrillation: -continue amiodarone 200 mg daily and Eliquis 2.5 mg BID  Recurrent strokes/TIA: -continue atorvastatin 20 mg daily  CAD: -continue atorvastatin 20 mg daily  HPpEF: -no acute cardiopulmonary disease on chest x-ray; crackles in bilateral bases; patient without SOB   Hypothyroidism: -continue levothyroxine 75 mcg  HTN: -no known home blood pressure meds -follow up with son in the morning   MDD:  -continue buspar 7.5 mg BID and zoloft 50 mg daily  Headaches:  -started topamax 6 weeks ago for headaches -topamax can cause nausea which patient reports began around the same time she began taking topoamax -will hold for now   Dispo: Admit patient to Observation with expected length of stay less than 2 midnights.  SignedLars Mage, MD 01/02/2019, 9:58 PM  Pager: [phone]

## 2019-01-02 NOTE — ED Triage Notes (Signed)
Per [facility], pt from home with a c/o N/V. Evaluated here two days ago for same. She was prescribed Zofran which made her feel better yesterday. She did not take the Zofran today and has had a couple episodes of vomiting. EMS started an IV and gave her Zofran with improvement of s/s.

## 2019-01-02 NOTE — ED Notes (Signed)
Meredith Gonzalez [phone] pts son please update

## 2019-01-02 NOTE — ED Provider Notes (Signed)
[facility] EMERGENCY DEPARTMENT Provider Note   CSN: [id number] Arrival date & time: 01/02/19  1701     History   Chief Complaint Chief Complaint  Patient presents with  . Emesis    HPI Meredith Gonzalez is a 83 y.o. female.     HPI  83 year old female presents with nausea/vomiting.  Started about 3 days ago.  Was seen here on 10/8.  Patient has had a lot of nausea with decreased intake over the last 2 days since leaving the ED.  Has tried the Zofran given to her.  No vomiting blood.  No diarrhea.  She has had lower abdominal pain across her lower abdomen for about a month.  No fevers, chest pain, shortness of breath or urinary symptoms. Was given IV zofran by EMS.  Past Medical History:  Diagnosis Date  . Anemia   . Anxiety   . Arthritis    "all over" (02/11/2018)  . Basal cell carcinoma    "left leg" (02/11/2018)  . Cervical spine fracture ([place]) 12/2017   "C1-2"  . CHF (congestive heart failure) ([place])   . Chronic bronchitis ([place])   . Chronic neck pain    "since I broke my neck 6-8 wk ago" (02/11/2018)  . Diabetic peripheral neuropathy ([place])   . Fibromyalgia   . Fracture of right humerus   . Generalized weakness   . Headache    "weekly" (02/11/2018)  . History of blood transfusion    "related to one of my femur surgeries" (02/11/2018)  . History of echocardiogram    Echo 8/18: EF 50-55, no RWMA, Gr 1 DD, calcified AV leaflets, MAC, trivial MR, mod LAE, PASP 37  . Hypertension   . Hypothyroidism   . Incontinence of urine   . Ischemic stroke ([place]) 2000   "lost part of the vision in my right eye" (02/11/2018)  . Major depression, chronic   . Myocardial infarction ([place]) 06/2016  . Pacemaker   . Recurrent falls   . Syncope and collapse   . Type 2 diabetes, diet controlled [facility])     Patient Active Problem List   Diagnosis Date Noted  . Vomiting 01/02/2019  . Closed fracture of left superior pubic ramus, initial encounter 10/18/2018  .  Dehydration 08/18/2018  . Dizziness 07/09/2018  . Stroke due to embolism of carotid artery ([place]) 06/05/2018  . Stroke-like symptom   . Dyspnea   . Acute blood loss anemia   . Acute ischemic stroke ([place]) 05/31/2018  . Stroke ([place]) 05/30/2018  . Chronic diastolic congestive heart failure ([place])   . Anemia of chronic disease   . Hypoalbuminemia due to protein-calorie malnutrition ([place])   . Labile blood pressure   . Atrial fibrillation ([place])   . Chronic neck pain   . Subcortical infarction ([place]) 04/27/2018  . CVA (cerebral vascular accident) ([place]) 04/26/2018  . TIA (transient ischemic attack) 04/24/2018  . CAD (coronary artery disease) 08/07/2016  . (HFpEF) heart failure with preserved ejection fraction ([place]) 08/07/2016  . Ischemic cardiomyopathy 08/07/2016  . Chest pain   . NSTEMI (non-ST elevated myocardial infarction) ([place])   . Status post coronary artery stent placement   . Abnormal stress test   . SOB (shortness of breath)   . Multifocal pneumonia 07/13/2016  . Elevated troponin 07/13/2016  . Leukocytosis   . Pacemaker 05/17/2014  . Complications, pacemaker cardiac, mechanical 12/31/2012  . Edema extremities 10/08/2012  . Persistent atrial fibrillation ([place]) 10/08/2012  . Hypertension 10/08/2012  . Hypothyroidism  10/08/2012  . Neuropathy 10/08/2012  . Diabetes ([place]) 10/08/2012  . Depression 09/08/2012  . Hypokalemia 08/08/2012  . Anemia 08/08/2012  . Knee fracture, left 08/07/2012  . Recurrent falls 08/07/2012  . Syncope 08/07/2012  . SDH (subdural hematoma) ([place]) 08/07/2012    Past Surgical History:  Procedure Laterality Date  . ABDOMINAL HYSTERECTOMY    . ANKLE FRACTURE SURGERY Right   . BASAL CELL CARCINOMA EXCISION Left    "leg"  . CARDIAC CATHETERIZATION  ~ 2014  . CARDIOVERSION N/A 02/13/2018   Procedure: CARDIOVERSION;  Surgeon: Sueanne Margarita, MD;  Location: [facility] ENDOSCOPY;  Service: Cardiovascular;  Laterality: N/A;  . CARDIOVERSION N/A 03/27/2018    Procedure: CARDIOVERSION;  Surgeon: Lelon Perla, MD;  Location: [facility] ENDOSCOPY;  Service: Cardiovascular;  Laterality: N/A;  . CARPAL TUNNEL RELEASE     bilateral  . CATARACT EXTRACTION W/ INTRAOCULAR LENS  IMPLANT, BILATERAL Bilateral   . CHOLECYSTECTOMY OPEN    . COLONOSCOPY    . CORONARY ANGIOPLASTY WITH STENT PLACEMENT  06/2016  . CORONARY STENT INTERVENTION N/A 07/19/2016   Procedure: Coronary Stent Intervention;  Surgeon: Peter M Martinique, MD;  Location: [facility] CV LAB;  Service: Cardiovascular;  Laterality: N/A;  . ENDARTERECTOMY Right 06/03/2018   Procedure: ENDARTERECTOMY CAROTID;  Surgeon: Rosetta Posner, MD;  Location: [facility];  Service: Vascular;  Laterality: Right;  . EYE SURGERY     right eye catarace/lens implant  . FEMUR FRACTURE SURGERY Bilateral   . FRACTURE SURGERY    . GASTRIC BYPASS    . INSERT / REPLACE / REMOVE PACEMAKER  ~ 2012  . LEAD REVISION N/A 01/01/2013   Procedure: LEAD REVISION;  Surgeon: Evans Lance, MD;  Location: [facility] CATH LAB;  Service: Cardiovascular;  Laterality: N/A;  . LEFT HEART CATH AND CORONARY ANGIOGRAPHY N/A 07/19/2016   Procedure: Left Heart Cath and Coronary Angiography;  Surgeon: Peter M Martinique, MD;  Location: [facility] CV LAB;  Service: Cardiovascular;  Laterality: N/A;  . OVARIAN CYST SURGERY     "one side only"  . PATCH ANGIOPLASTY Right 06/03/2018   Procedure: PATCH ANGIOPLASTY USING HEMASHIELD PLATINUM FINESSE;  Surgeon: Rosetta Posner, MD;  Location: [facility];  Service: Vascular;  Laterality: Right;  . TEE WITHOUT CARDIOVERSION N/A 02/13/2018   Procedure: TRANSESOPHAGEAL ECHOCARDIOGRAM (TEE);  Surgeon: Sueanne Margarita, MD;  Location: [facility] ENDOSCOPY;  Service: Cardiovascular;  Laterality: N/A;  . TONSILLECTOMY       OB History   No obstetric history on file.      Home Medications    Prior to Admission medications   Medication Sig Start Date End Date Taking? Authorizing Provider  apixaban (ELIQUIS) 2.5 MG TABS tablet Take 1  tablet (2.5 mg total) by mouth 2 (two) times daily. 07/13/18  Yes Domenic Polite, MD  acetaminophen (TYLENOL) 500 MG tablet Take 1,000 mg by mouth 3 (three) times daily.     [provider]  albuterol (PROVENTIL HFA;VENTOLIN HFA) 108 (90 Base) MCG/ACT inhaler Inhale 2 puffs into the lungs every 6 (six) hours as needed for wheezing or shortness of breath. 05/07/18   Angiulli, Lavon Paganini, PA-C  alum & mag hydroxide-simeth (MAALOX/MYLANTA) 200-200-20 MG/5ML suspension Take 20 mLs by mouth every 6 (six) hours as needed for indigestion or heartburn.    [provider]  amiodarone (PACERONE) 200 MG tablet Take 1 tablet (200 mg total) by mouth every morning. 05/07/18   Angiulli, Lavon Paganini, PA-C  atorvastatin (LIPITOR) 20 MG tablet Take  1 tablet (20 mg total) by mouth daily for 30 days. 06/05/18   Dessa Phi, DO  budesonide-formoterol [facility]) 160-4.5 MCG/ACT inhaler Inhale 2 puffs into the lungs 2 (two) times daily. 05/20/18   Mannam, Hart Robinsons, MD  busPIRone (BUSPAR) 7.5 MG tablet Take 7.5 mg by mouth 2 (two) times daily.    [provider]  calcitonin, salmon, (MIACALCIN/FORTICAL) 200 UNIT/ACT nasal spray 1 spray every other day. Left nare 08/04/18   [provider]  cetaphil (CETAPHIL) cream Apply 1 application topically 3 (three) times daily.    [provider]  diphenhydrAMINE (BENADRYL) 25 MG tablet Take 25 mg by mouth every 8 (eight) hours as needed for itching.    [provider]  docusate sodium (COLACE) 100 MG capsule Take 1 capsule (100 mg total) by mouth daily. 06/24/18   Love, Ivan Anchors, PA-C  ferrous sulfate 325 (65 FE) MG tablet Take 1 tablet (325 mg total) by mouth 2 (two) times daily with a meal. 05/07/18   Angiulli, Lavon Paganini, PA-C  flintstones complete (FLINTSTONES) 60 MG chewable tablet Chew 1 tablet by mouth daily.    [provider]  gabapentin (NEURONTIN) 300 MG capsule Take 300 mg by mouth 2 (two) times daily.    [provider]  gabapentin (NEURONTIN) 300 MG capsule Take 600 mg by mouth at bedtime.    [provider]  hydrOXYzine (ATARAX/VISTARIL) 25 MG tablet Take 25 mg by mouth every 6 (six) hours as needed. Stop date 11/12/2018    [provider]  levothyroxine (SYNTHROID, LEVOTHROID) 75 MCG tablet Take 1 tablet (75 mcg total) by mouth daily at 6 (six) AM. 06/24/18   Love, Ivan Anchors, PA-C  Melatonin 5 MG TABS Take 5 mg by mouth at bedtime.    [provider]  nitroGLYCERIN (NITROSTAT) 0.4 MG SL tablet Place 1 tablet (0.4 mg total) under the tongue every 5 (five) minutes as needed for chest pain. 10/11/16   Fay Records, MD  ondansetron (ZOFRAN ODT) 4 MG disintegrating tablet 80m ODT q4 hours prn nausea/vomit 12/31/18   ZMilton Ferguson MD  ondansetron (ZOFRAN) 4 MG tablet Take 4 mg by mouth every 6 (six) hours as needed for nausea or vomiting.    [provider]  pantoprazole (PROTONIX) 40 MG tablet Take 1 tablet (40 mg total) by mouth daily. Patient taking differently: Take 40 mg by mouth 2 (two) times daily.  06/24/18   Love, PIvan Anchors PA-C  sertraline (ZOLOFT) 50 MG tablet Take 50 mg by mouth daily.    [provider]  topiramate (TOPAMAX) 25 MG tablet Take 1 tablet (25 mg total) by mouth daily. 11/09/18   MFrann Rider NP    Family History Family History  Problem Relation Age of Onset  . Congestive Heart Failure Mother   . Heart attack Father   . Alcoholism Father   . Alcoholism Sister   . Alcoholism Brother   . Alcoholism Brother   . Throat cancer Brother     Social History Social History   Tobacco Use  . Smoking status: Former Smoker    Packs/day: 0.50    Years: 4.00    Pack years: 2.00    Types: Cigarettes    Quit date: 08/07/1972    Years since quitting: 46.4  . Smokeless tobacco: Never Used  Substance Use Topics  . Alcohol use: Yes    Frequency: Never    Comment: once or twice a year  . Drug use: Never  Allergies   Valium  [diazepam], Robaxin [methocarbamol], Tramadol, Codeine, Darifenacin, Darvon [propoxyphene hcl], Daypro [oxaprozin], Enablex [darifenacin hydrobromide er], Oxycodone, Propoxyphene, Risperdal [risperidone], Talwin [pentazocine], and Vicodin [hydrocodone-acetaminophen]   Review of Systems Review of Systems  Constitutional: Negative for fever.  Respiratory: Negative for shortness of breath.   Cardiovascular: Negative for chest pain.  Gastrointestinal: Positive for abdominal pain, nausea and vomiting. Negative for diarrhea.  Genitourinary: Negative for dysuria.  Musculoskeletal: Negative for back pain.  All other systems reviewed and are negative.    Physical Exam Updated Vital Signs BP (!) 190/73   Pulse 65   Temp 98.6 F (37 C) (Oral)   Resp 14   Ht _0  (1.6 m)   Wt 52.7 kg   SpO2 94%   BMI 20.58 kg/m   Physical Exam Vitals signs and nursing note reviewed.  Constitutional:      General: She is not in acute distress.    Appearance: She is well-developed. She is not ill-appearing or diaphoretic.  HENT:     Head: Normocephalic and atraumatic.     Right Ear: External ear normal.     Left Ear: External ear normal.     Nose: Nose normal.  Eyes:     General:        Right eye: No discharge.        Left eye: No discharge.  Cardiovascular:     Rate and Rhythm: Normal rate and regular rhythm.     Heart sounds: Normal heart sounds.  Pulmonary:     Effort: Pulmonary effort is normal.     Breath sounds: Normal breath sounds.  Abdominal:     Palpations: Abdomen is soft.     Tenderness: There is abdominal tenderness. There is no right CVA tenderness or left CVA tenderness.     Comments: Mild, across lower abdomen, nonfocal  Skin:    General: Skin is warm and dry.  Neurological:     Mental Status: She is alert.  Psychiatric:        Mood and Affect: Mood is not anxious.      ED Treatments / Results  Labs (all labs ordered are listed, but only abnormal results are  displayed) Labs Reviewed  COMPREHENSIVE METABOLIC PANEL - Abnormal; Notable for the following components:      Result Value   Potassium 3.4 (*)    Calcium 8.1 (*)    Total Protein 6.3 (*)    Albumin 2.7 (*)    AST 105 (*)    All other components within normal limits  CBC WITH DIFFERENTIAL/PLATELET - Abnormal; Notable for the following components:   RBC 3.67 (*)    MCV 106.3 (*)    All other components within normal limits  URINALYSIS, ROUTINE W REFLEX MICROSCOPIC - Abnormal; Notable for the following components:   Hgb urine dipstick MODERATE (*)    Ketones, ur 20 (*)    Protein, ur 30 (*)    Leukocytes,Ua SMALL (*)    All other components within normal limits  URINE CULTURE  SARS CORONAVIRUS 2 (TAT 6-24 HRS)  LIPASE, BLOOD  CBC  COMPREHENSIVE METABOLIC PANEL    EKG EKG Interpretation  Date/Time:  Saturday January 02 2019 17:24:17 EDT Ventricular Rate:  67 PR Interval:    QRS Duration: 152 QT Interval:  460 QTC Calculation: 486 R Axis:   -76 Text Interpretation:  Sinus rhythm RBBB and LAFB Probable left ventricular hypertrophy no pacer spikes, but otherwise similar to July 2020 Confirmed by Regenia Skeeter,  Cambrie Sonnenfeld [phone]) on 01/02/2019 5:27:14 PM   Radiology Dg Abd Acute 2+v W 1v Chest  Result Date: 01/02/2019 CLINICAL DATA:  Stomach pain. EXAM: DG ABDOMEN ACUTE W/ 1V CHEST COMPARISON:  Aug 17, 2018 chest radiograph December 31, 2018, abdominal CT. FINDINGS: There is no evidence of dilated bowel loops or free intraperitoneal air. No radiopaque calculi or other significant radiographic abnormality is seen. Enlarged cardiac silhouette. Cardiac pacemaker in stable position. Both lungs are clear. IMPRESSION: 1. Nonobstructive bowel gas pattern. 2. No acute cardiopulmonary disease. 3. Enlarged cardiac silhouette. Electronically Signed   By: Fidela Salisbury M.D.   On: 01/02/2019 18:19    Procedures Procedures (including critical care time)  Medications Ordered in ED Medications   0.9 %  sodium chloride infusion ( Intravenous New Bag/Given 01/02/19 2304)  senna-docusate (Senokot-S) tablet 1 tablet (has no administration in time range)  promethazine (PHENERGAN) tablet 12.5 mg (has no administration in time range)  dicyclomine (BENTYL) 10 MG/5ML solution 10 mg (has no administration in time range)  albuterol (PROVENTIL) (2.5 MG/3ML) 0.083% nebulizer solution 2.5 mg (has no administration in time range)  alum & mag hydroxide-simeth (MAALOX/MYLANTA) 200-200-20 MG/5ML suspension 20 mL (has no administration in time range)  amiodarone (PACERONE) tablet 200 mg (has no administration in time range)  atorvastatin (LIPITOR) tablet 20 mg (has no administration in time range)  mometasone-formoterol (DULERA) 200-5 MCG/ACT inhaler 2 puff (has no administration in time range)  busPIRone (BUSPAR) tablet 7.5 mg (has no administration in time range)  gabapentin (NEURONTIN) capsule 300 mg (has no administration in time range)  gabapentin (NEURONTIN) capsule 600 mg (has no administration in time range)  levothyroxine (SYNTHROID) tablet 75 mcg (has no administration in time range)  Melatonin TABS 3 mg (has no administration in time range)  sertraline (ZOLOFT) tablet 50 mg (has no administration in time range)  topiramate (TOPAMAX) tablet 25 mg (has no administration in time range)  pantoprazole (PROTONIX) EC tablet 40 mg (has no administration in time range)  apixaban (ELIQUIS) tablet 2.5 mg (has no administration in time range)  fentaNYL (SUBLIMAZE) injection 50 mcg (50 mcg Intravenous Given 01/02/19 1730)  sodium chloride 0.9 % bolus 1,000 mL (0 mLs Intravenous Stopping Infusion hung by another clincian 01/02/19 2317)  promethazine (PHENERGAN) injection 12.5 mg (12.5 mg Intravenous Given 01/02/19 1800)  potassium chloride SA (KLOR-CON) CR tablet 40 mEq (40 mEq Oral Given 01/02/19 2042)  cephALEXin (KEFLEX) capsule 500 mg (500 mg Oral Given 01/02/19 2042)  alum & mag hydroxide-simeth  (MAALOX/MYLANTA) 200-200-20 MG/5ML suspension 30 mL (30 mLs Oral Given 01/02/19 2301)     Initial Impression / Assessment and Plan / ED Course  I have reviewed the triage vital signs and the nursing notes.  Pertinent labs & imaging results that were available during my care of the patient were reviewed by me and considered in my medical decision making (see chart for details).        Patient has questionable help at home.  She does have a son and daughter that can help in the evening and early morning but otherwise she is at home alone.  She feels unsafe doing this at home.  Thus will admit for fluids and nausea control.  There is some possible UTI and will treat with Keflex.  Internal medicine teaching service to admit.  Has some abdominal pain but this has been ongoing for about a month and with negative CT just 2 days ago I do not think repeat is needed.  Benjamine Mola  Kopecky was evaluated in Emergency Department on 01/02/2019 for the symptoms described in the history of present illness. She was evaluated in the context of the global COVID-19 pandemic, which necessitated consideration that the patient might be at risk for infection with the SARS-CoV-2 virus that causes COVID-19. Institutional protocols and algorithms that pertain to the evaluation of patients at risk for COVID-19 are in a state of rapid change based on information released by regulatory bodies including the CDC and federal and state organizations. These policies and algorithms were followed during the patient's care in the ED.   Final Clinical Impressions(s) / ED Diagnoses   Final diagnoses:  Vomiting in adult  Acute urinary tract infection  Hypokalemia    ED Discharge Orders    None       Sherwood Gambler, MD 01/02/19 2358

## 2019-01-02 NOTE — ED Notes (Signed)
Placed DNR bracelet. Pt confirmed DNR wishes.

## 2019-01-03 DIAGNOSIS — I5032 Chronic diastolic (congestive) heart failure: Secondary | ICD-10-CM | POA: Diagnosis present

## 2019-01-03 DIAGNOSIS — Z885 Allergy status to narcotic agent status: Secondary | ICD-10-CM

## 2019-01-03 DIAGNOSIS — D539 Nutritional anemia, unspecified: Secondary | ICD-10-CM | POA: Diagnosis not present

## 2019-01-03 DIAGNOSIS — Z8673 Personal history of transient ischemic attack (TIA), and cerebral infarction without residual deficits: Secondary | ICD-10-CM

## 2019-01-03 DIAGNOSIS — Z79890 Hormone replacement therapy (postmenopausal): Secondary | ICD-10-CM

## 2019-01-03 DIAGNOSIS — E785 Hyperlipidemia, unspecified: Secondary | ICD-10-CM

## 2019-01-03 DIAGNOSIS — E039 Hypothyroidism, unspecified: Secondary | ICD-10-CM | POA: Diagnosis present

## 2019-01-03 DIAGNOSIS — R630 Anorexia: Secondary | ICD-10-CM

## 2019-01-03 DIAGNOSIS — I452 Bifascicular block: Secondary | ICD-10-CM | POA: Diagnosis present

## 2019-01-03 DIAGNOSIS — F0390 Unspecified dementia without behavioral disturbance: Secondary | ICD-10-CM | POA: Diagnosis present

## 2019-01-03 DIAGNOSIS — I5042 Chronic combined systolic (congestive) and diastolic (congestive) heart failure: Secondary | ICD-10-CM

## 2019-01-03 DIAGNOSIS — R1013 Epigastric pain: Secondary | ICD-10-CM | POA: Diagnosis present

## 2019-01-03 DIAGNOSIS — R627 Adult failure to thrive: Secondary | ICD-10-CM | POA: Diagnosis present

## 2019-01-03 DIAGNOSIS — Z7951 Long term (current) use of inhaled steroids: Secondary | ICD-10-CM | POA: Diagnosis not present

## 2019-01-03 DIAGNOSIS — Z79899 Other long term (current) drug therapy: Secondary | ICD-10-CM

## 2019-01-03 DIAGNOSIS — I2510 Atherosclerotic heart disease of native coronary artery without angina pectoris: Secondary | ICD-10-CM | POA: Diagnosis present

## 2019-01-03 DIAGNOSIS — F339 Major depressive disorder, recurrent, unspecified: Secondary | ICD-10-CM

## 2019-01-03 DIAGNOSIS — I4891 Unspecified atrial fibrillation: Secondary | ICD-10-CM | POA: Diagnosis not present

## 2019-01-03 DIAGNOSIS — F329 Major depressive disorder, single episode, unspecified: Secondary | ICD-10-CM | POA: Diagnosis present

## 2019-01-03 DIAGNOSIS — Z811 Family history of alcohol abuse and dependence: Secondary | ICD-10-CM | POA: Diagnosis not present

## 2019-01-03 DIAGNOSIS — I4819 Other persistent atrial fibrillation: Secondary | ICD-10-CM | POA: Diagnosis present

## 2019-01-03 DIAGNOSIS — E1142 Type 2 diabetes mellitus with diabetic polyneuropathy: Secondary | ICD-10-CM | POA: Diagnosis present

## 2019-01-03 DIAGNOSIS — I110 Hypertensive heart disease with heart failure: Secondary | ICD-10-CM | POA: Diagnosis present

## 2019-01-03 DIAGNOSIS — Z20828 Contact with and (suspected) exposure to other viral communicable diseases: Secondary | ICD-10-CM | POA: Diagnosis present

## 2019-01-03 DIAGNOSIS — Z7901 Long term (current) use of anticoagulants: Secondary | ICD-10-CM | POA: Diagnosis not present

## 2019-01-03 DIAGNOSIS — I252 Old myocardial infarction: Secondary | ICD-10-CM | POA: Diagnosis not present

## 2019-01-03 DIAGNOSIS — R634 Abnormal weight loss: Secondary | ICD-10-CM

## 2019-01-03 DIAGNOSIS — M159 Polyosteoarthritis, unspecified: Secondary | ICD-10-CM | POA: Diagnosis present

## 2019-01-03 DIAGNOSIS — R1310 Dysphagia, unspecified: Secondary | ICD-10-CM

## 2019-01-03 DIAGNOSIS — E119 Type 2 diabetes mellitus without complications: Secondary | ICD-10-CM

## 2019-01-03 DIAGNOSIS — Z888 Allergy status to other drugs, medicaments and biological substances status: Secondary | ICD-10-CM

## 2019-01-03 DIAGNOSIS — R296 Repeated falls: Secondary | ICD-10-CM | POA: Diagnosis present

## 2019-01-03 DIAGNOSIS — R519 Headache, unspecified: Secondary | ICD-10-CM

## 2019-01-03 DIAGNOSIS — Z8249 Family history of ischemic heart disease and other diseases of the circulatory system: Secondary | ICD-10-CM | POA: Diagnosis not present

## 2019-01-03 DIAGNOSIS — Z85828 Personal history of other malignant neoplasm of skin: Secondary | ICD-10-CM | POA: Diagnosis not present

## 2019-01-03 DIAGNOSIS — Z950 Presence of cardiac pacemaker: Secondary | ICD-10-CM

## 2019-01-03 DIAGNOSIS — Z66 Do not resuscitate: Secondary | ICD-10-CM

## 2019-01-03 DIAGNOSIS — R112 Nausea with vomiting, unspecified: Secondary | ICD-10-CM

## 2019-01-03 DIAGNOSIS — E46 Unspecified protein-calorie malnutrition: Secondary | ICD-10-CM | POA: Diagnosis present

## 2019-01-03 DIAGNOSIS — I482 Chronic atrial fibrillation, unspecified: Secondary | ICD-10-CM

## 2019-01-03 DIAGNOSIS — Z955 Presence of coronary angioplasty implant and graft: Secondary | ICD-10-CM

## 2019-01-03 DIAGNOSIS — K219 Gastro-esophageal reflux disease without esophagitis: Secondary | ICD-10-CM

## 2019-01-03 DIAGNOSIS — Z881 Allergy status to other antibiotic agents status: Secondary | ICD-10-CM

## 2019-01-03 DIAGNOSIS — D509 Iron deficiency anemia, unspecified: Secondary | ICD-10-CM

## 2019-01-03 LAB — CBC
HCT: 37.3 % (ref 36.0–46.0)
Hemoglobin: 11.7 g/dL — ABNORMAL LOW (ref 12.0–15.0)
MCH: 33.1 pg (ref 26.0–34.0)
MCHC: 31.4 g/dL (ref 30.0–36.0)
MCV: 105.4 fL — ABNORMAL HIGH (ref 80.0–100.0)
Platelets: 245 10*3/uL (ref 150–400)
RBC: 3.54 MIL/uL — ABNORMAL LOW (ref 3.87–5.11)
RDW: 13.2 % (ref 11.5–15.5)
WBC: 8.3 10*3/uL (ref 4.0–10.5)
nRBC: 0 % (ref 0.0–0.2)

## 2019-01-03 LAB — COMPREHENSIVE METABOLIC PANEL
ALT: 38 U/L (ref 0–44)
AST: 111 U/L — ABNORMAL HIGH (ref 15–41)
Albumin: 2.5 g/dL — ABNORMAL LOW (ref 3.5–5.0)
Alkaline Phosphatase: 86 U/L (ref 38–126)
Anion gap: 9 (ref 5–15)
BUN: 8 mg/dL (ref 8–23)
CO2: 23 mmol/L (ref 22–32)
Calcium: 7.8 mg/dL — ABNORMAL LOW (ref 8.9–10.3)
Chloride: 107 mmol/L (ref 98–111)
Creatinine, Ser: 0.67 mg/dL (ref 0.44–1.00)
GFR calc Af Amer: >60 mL/min (ref >60)
GFR calc non Af Amer: >60 mL/min (ref >60)
Glucose, Bld: 83 mg/dL (ref 70–99)
Potassium: 4.4 mmol/L (ref 3.5–5.1)
Sodium: 139 mmol/L (ref 135–145)
Total Bilirubin: 0.8 mg/dL (ref 0.3–1.2)
Total Protein: 5.7 g/dL — ABNORMAL LOW (ref 6.5–8.1)

## 2019-01-03 LAB — SARS CORONAVIRUS 2 (TAT 6-24 HRS): SARS Coronavirus 2: NEGATIVE

## 2019-01-03 LAB — TSH: TSH: 1.732 u[IU]/mL (ref 0.350–4.500)

## 2019-01-03 LAB — URINE CULTURE: Culture: <10000 — AB

## 2019-01-03 MED ORDER — LACTATED RINGERS IV SOLN: INTRAVENOUS | Status: DC

## 2019-01-03 MED ORDER — HYDRALAZINE HCL 10 MG PO TABS
10.0000 mg | ORAL_TABLET | Freq: Once | ORAL | Status: AC
  Administered 2019-01-03: 10 mg via ORAL
  Filled 2019-01-03: qty 1

## 2019-01-03 MED ORDER — SERTRALINE HCL 100 MG PO TABS
100.0000 mg | ORAL_TABLET | Freq: Every day | ORAL | Status: DC
  Administered 2019-01-04 – 2019-01-05 (×2): 100 mg via ORAL
  Filled 2019-01-03 (×2): qty 1

## 2019-01-03 MED ORDER — HYDRALAZINE HCL 20 MG/ML IJ SOLN
5.0000 mg | Freq: Once | INTRAMUSCULAR | Status: AC
  Administered 2019-01-03: 5 mg via INTRAVENOUS
  Filled 2019-01-03: qty 1

## 2019-01-03 NOTE — Progress Notes (Signed)
  Date: 01/03/2019  Patient name: Meredith Gonzalez  Medical record number: [id number]  Date of birth: 06/30/1935        I have seen and evaluated this patient and I have discussed the plan of care with the house staff. Please see their note for complete details. I concur with their findings. Please see my attested H&P from earlier today.  Additional history provided today by the patient's family. Patient lives with her son and requires 24 hour supervision / assistance.  Apparently she has not been complaining of abdominal pain at all. Son also denies emesis. She did have a couple episodes of dry heaving a few days ago but otherwise his main concern is that patient may be depressed and not eating. Her zoloft was recently decreased. Since then she has become more withdrawn and with significant reduction in PO intake. This is likely the cause of her weight loss. She also has a history of dementia and is frequently disoriented. Overall this paints and entirely different clinical picture. I am less concerned for GI etiology of her weight loss. More likely she is has acute worsening of her dementia from recent decrease in SSRI vs. approaching end stage dementia. She recently told son that she didn't care if she died. With this additional information, will focus on treatment of depression as a possible reversible cause for her failure to thrive. No need for GI consult at this time.  Will consult for PT / OT for possible placement. Her needs have been increasing at home and family believes they can no longer provide the level of care she needs.   Agree with thiamine and folate supplementation given poor PO intake and macrocytic anemia.   Rest per resident note.   Velna Ochs, MD 01/03/2019, 7:33 PM

## 2019-01-03 NOTE — Progress Notes (Signed)
NAME:  Rana Buhay, MRN:  [id number], DOB:  05/19/1935, LOS: 0 ADMISSION DATE:  01/02/2019, Primary: Sinclair Ship, MD  CHIEF COMPLAINT: weakness   Medical Service: Internal Medicine Teaching Service         Attending Physician: Dr. Rayne Du att. providers found    First Contact: Dr. Darrick Meigs Pager: [phone]  Second Contact: Dr. Sharon Seller Pager: [phone]       After Hours (After 5p/  First Contact Pager: [phone]  weekends / holidays): Second Contact Pager: [phone]    Brief History  83 yo female with PMH of a fib, numerous strokes, CAD, HFpEF, hypothyroidism, HTN, MDD, DM2 presenting with 3d history of vomiting, 1y hx of weight loss and loss of appetite. Subjective  Endorses epigastric pain and nausea  Significant Hospital Events   10/10 admission  Objective   Blood pressure (!) 182/75, pulse 67, temperature 98.3 F (36.8 C), temperature source Oral, resp. rate 18, height 5\' 3"  (1.6 m), weight 52.7 kg, SpO2 97 %.     Intake/Output Summary (Last 24 hours) at 01/03/2019 0432 Last data filed at 01/03/2019 0400 Gross per 24 hour  Intake 708.3 ml  Output -  Net 708.3 ml   Filed Weights   01/02/19 1917  Weight: 52.7 kg    Examination: GENERAL: in no acute distress CARDIAC: heart RRR PULMONARY: acyanotic. Lung sounds clear to auscultation. ABDOMEN: soft. Epigastric tenderness. Midline incisional scar. Non distended. SKIN: no rash or lesions on limited exam  PSYCH: flat affect  Consults:    Significant Diagnostic Tests:  10/10 CXR: unremarkable for acute cardiopulmonary process  10/10 abd xray: unremarkable  Micro Data:  10/10 urine culture>>  Antimicrobials:  none  Labs   CBC: Recent Labs  Lab 12/31/18 1139 01/02/19 1808 01/03/19 0312  WBC 8.7 8.4 8.3  NEUTROABS 8.1* 6.8  --   HGB 13.4 12.2 11.7*  HCT 40.7 39.0 37.3  MCV 103.6* 106.3* 105.4*  PLT 225 241 [id number]    Basic Metabolic Panel: Recent Labs  Lab 12/31/18 1139 01/02/19 1808 01/03/19 0312  NA  137 138 139  K 3.3* 3.4* 4.4  CL 104 107 107  CO2 18* 22 23  GLUCOSE 133* 96 83  BUN 8 14 8   CREATININE 0.67 0.74 0.67  CALCIUM 8.3* 8.1* 7.8*   GFR: Estimated Creatinine Clearance: 45.6 mL/min (by C-G formula based on SCr of 0.67 mg/dL). Recent Labs  Lab 12/31/18 1139 01/02/19 1808 01/03/19 0312  WBC 8.7 8.4 8.3    Liver Function Tests: Recent Labs  Lab 12/31/18 1139 01/02/19 1808 01/03/19 0312  AST 32 105* 111*  ALT 21 37 38  ALKPHOS 104 87 86  BILITOT 1.5* 0.8 0.8  PROT 7.0 6.3* 5.7*  ALBUMIN 3.0* 2.7* 2.5*   Stable/resolved problems  Atrial fibrillation-continue amiodarone and eliquis Recurrent strokes/CAD-continue statin MDD-continue buspar and zoloft Hypothyroidism-continue synthroid  Summary  83 yo female with one year history of progressive weight loss, weakness and anorexia. N/v x3d.  Assessment & Plan:  Active Problems:   Vomiting  Vomiting x3d. abd xray, CT, CXR unremarkable. Lipase nml. EKG unchanged from July 2020. Discussed with patient's son who notes that patient has not been complaining of abdominal pain. Had a couple of episodes of dry heaving a few days ago. He endorses overall anorexia and weight loss over the past year. Denies hemetemesis. He notes that pt occasionally will complain or talk about things that occurred in the remote past. UA non revealing UC pending AST elevated 111. Likely in  setting of vomitting. ALT nml.  Plan Phenergan for n/v GI cocktail q6h prn Continue PPI IVF Consider outpatient referral to GI if sx persist.  MDD. Anorexia x1 year. Associated with low energy, poor sleep, poor appetite. Developed subsequent weakness to the extent of requiring 24h supervision. Stated to her son that she didn't care if she died. History of MDD. Treated with zoloft and buspar. zoloft was reportedly decreased to 50mg  about one year ago. This fits with the general time line of sx progression.  Plan Will increase zoloft back to 100mg  daily  Continue buspar  Macrocytic anemia. hgb 11.7 on admission. MCV 105. Folate deficiency? Possibly from poor po intake? Consider starting folate supplement. Will defer to outpatient primary for this.  HTN. Blood pressures fairly elevated in the 170s-180s. Last blood pressure 139/61.  Doesn't appear to be on anything at home for this. Will continue to monitor and treat accordingly.  Best practice:  CODE STATUS: DNR Diet: heart healthy Pain management: tylenol GI prophylaxis: protonix DVT for prophylaxis: on eliquis Family communication: spoke with patient's son who painted a more chronic picture of the anorexia. He denies her complaining of abdominal pain. He is more concerned about her mood and progressive weakness over the past year. Is wondering about placement for rehabilitation. Dispo: SNF/rehab   Mitzi Hansen, MD INTERNAL MEDICINE RESIDENT PGY-1 PAGER #: [phone] 01/03/19 4:32 AM

## 2019-01-03 NOTE — ED Notes (Signed)
Spoke to RN for [place], ready to go upstairs comfirmed COVID negative and updated r/t BP management

## 2019-01-03 NOTE — ED Notes (Signed)
ED TO INPATIENT HANDOFF REPORT  ED Nurse Name and Phone #:  [phone]  S Name/Age/Gender Meredith Gonzalez 83 y.o. female Room/Bed: 032C/032C  Code Status   Code Status: DNR  Home/SNF/Other Home Patient oriented to: self, place and situation Is this baseline? No   Triage Complete: Triage complete  Chief Complaint n/v  Triage Note Per [facility], pt from home with a c/o N/V. Evaluated here two days ago for same. She was prescribed Zofran which made her feel better yesterday. She did not take the Zofran today and has had a couple episodes of vomiting. EMS started an IV and gave her Zofran with improvement of s/s.    Allergies Allergies  Allergen Reactions  . Valium [Diazepam] Anxiety    Makes patient hyper  . Robaxin [Methocarbamol] Other (See Comments)    Makes pt loopy/acts drunk  . Tramadol Other (See Comments)    sedation  . Codeine Hives and Itching    Can take with Benadryl   . Darifenacin Itching    Can take with Benadryl  . Darvon [Propoxyphene Hcl] Itching    Can take with Benadryl  . Daypro [Oxaprozin] Itching    Can take with Benadryl  . Enablex [Darifenacin Hydrobromide Er] Itching    Can take with Benadryl  . Oxycodone Itching    Can take with Benadryl   . Propoxyphene Itching    Can take with Benadryl  . Risperdal [Risperidone] Itching    Can take with Benadryl  . Talwin [Pentazocine] Itching    Can take with Benadryl  . Vicodin [Hydrocodone-Acetaminophen] Itching    Can take with Benadryl     Level of Care/Admitting Diagnosis ED Disposition    ED Disposition Condition [facility]: [place] [[id number]]  Level of Care: Med-Surg [16]  Covid Evaluation: Asymptomatic Screening Protocol (No Symptoms)  Diagnosis: Vomiting [098119]  Admitting Physician: Velna Ochs [[id number]]  Attending Physician: Velna Ochs [[id number]]  PT Class (Do Not Modify): Observation [104]  PT Acc Code (Do Not Modify):  Observation [10022]       B Medical/Surgery History Past Medical History:  Diagnosis Date  . Anemia   . Anxiety   . Arthritis    "all over" (02/11/2018)  . Basal cell carcinoma    "left leg" (02/11/2018)  . Cervical spine fracture ([place]) 12/2017   "C1-2"  . CHF (congestive heart failure) ([place])   . Chronic bronchitis ([place])   . Chronic neck pain    "since I broke my neck 6-8 wk ago" (02/11/2018)  . Diabetic peripheral neuropathy ([place])   . Fibromyalgia   . Fracture of right humerus   . Generalized weakness   . Headache    "weekly" (02/11/2018)  . History of blood transfusion    "related to one of my femur surgeries" (02/11/2018)  . History of echocardiogram    Echo 8/18: EF 50-55, no RWMA, Gr 1 DD, calcified AV leaflets, MAC, trivial MR, mod LAE, PASP 37  . Hypertension   . Hypothyroidism   . Incontinence of urine   . Ischemic stroke ([place]) 2000   "lost part of the vision in my right eye" (02/11/2018)  . Major depression, chronic   . Myocardial infarction ([place]) 06/2016  . Pacemaker   . Recurrent falls   . Syncope and collapse   . Type 2 diabetes, diet controlled ([place])    Past Surgical History:  Procedure Laterality Date  . ABDOMINAL HYSTERECTOMY    . ANKLE FRACTURE  SURGERY Right   . BASAL CELL CARCINOMA EXCISION Left    "leg"  . CARDIAC CATHETERIZATION  ~ 2014  . CARDIOVERSION N/A 02/13/2018   Procedure: CARDIOVERSION;  Surgeon: Sueanne Margarita, MD;  Location: [facility] ENDOSCOPY;  Service: Cardiovascular;  Laterality: N/A;  . CARDIOVERSION N/A 03/27/2018   Procedure: CARDIOVERSION;  Surgeon: Lelon Perla, MD;  Location[facility] ENDOSCOPY;  Service: Cardiovascular;  Laterality: N/A;  . CARPAL TUNNEL RELEASE     bilateral  . CATARACT EXTRACTION W/ INTRAOCULAR LENS  IMPLANT, BILATERAL Bilateral   . CHOLECYSTECTOMY OPEN    . COLONOSCOPY    . CORONARY ANGIOPLASTY WITH STENT PLACEMENT  06/2016  . CORONARY STENT INTERVENTION N/A 07/19/2016   Procedure: Coronary Stent  Intervention;  Surgeon: Peter M Martinique, MD;  Location: [facility] CV LAB;  Service: Cardiovascular;  Laterality: N/A;  . ENDARTERECTOMY Right 06/03/2018   Procedure: ENDARTERECTOMY CAROTID;  Surgeon: Rosetta Posner, MD;  Location: [facility];  Service: Vascular;  Laterality: Right;  . EYE SURGERY     right eye catarace/lens implant  . FEMUR FRACTURE SURGERY Bilateral   . FRACTURE SURGERY    . GASTRIC BYPASS    . INSERT / REPLACE / REMOVE PACEMAKER  ~ 2012  . LEAD REVISION N/A 01/01/2013   Procedure: LEAD REVISION;  Surgeon: Evans Lance, MD;  Location: [facility] CATH LAB;  Service: Cardiovascular;  Laterality: N/A;  . LEFT HEART CATH AND CORONARY ANGIOGRAPHY N/A 07/19/2016   Procedure: Left Heart Cath and Coronary Angiography;  Surgeon: Peter M Martinique, MD;  Location: [facility] CV LAB;  Service: Cardiovascular;  Laterality: N/A;  . OVARIAN CYST SURGERY     "one side only"  . PATCH ANGIOPLASTY Right 06/03/2018   Procedure: PATCH ANGIOPLASTY USING HEMASHIELD PLATINUM FINESSE;  Surgeon: Rosetta Posner, MD;  Location: [place];  Service: Vascular;  Laterality: Right;  . TEE WITHOUT CARDIOVERSION N/A 02/13/2018   Procedure: TRANSESOPHAGEAL ECHOCARDIOGRAM (TEE);  Surgeon: Sueanne Margarita, MD;  Location: [facility] ENDOSCOPY;  Service: Cardiovascular;  Laterality: N/A;  . TONSILLECTOMY       A IV Location/Drains/Wounds Patient Lines/Drains/Airways Status   Active Line/Drains/Airways    Name:   Placement date:   Placement time:   Site:   Days:   Peripheral IV 01/02/19 Anterior;Proximal;Right Forearm   01/02/19    1703    Forearm   1          Intake/Output Last 24 hours No intake or output data in the 24 hours ending 01/03/19 0032  Labs/Imaging Results for orders placed or performed during the hospital encounter of 01/02/19 (from the past 48 hour(s))  Comprehensive metabolic panel     Status: Abnormal   Collection Time: 01/02/19  6:08 PM  Result Value Ref Range   Sodium 138 135 - 145 mmol/L   Potassium 3.4  (L) 3.5 - 5.1 mmol/L   Chloride 107 98 - 111 mmol/L   CO2 22 22 - 32 mmol/L   Glucose, Bld 96 70 - 99 mg/dL   BUN 14 8 - 23 mg/dL   Creatinine, Ser 0.74 0.44 - 1.00 mg/dL   Calcium 8.1 (L) 8.9 - 10.3 mg/dL   Total Protein 6.3 (L) 6.5 - 8.1 g/dL   Albumin 2.7 (L) 3.5 - 5.0 g/dL   AST 105 (H) 15 - 41 U/L   ALT 37 0 - 44 U/L   Alkaline Phosphatase 87 38 - 126 U/L   Total Bilirubin 0.8 0.3 - 1.2 mg/dL  GFR calc non Af Amer >60 >60 mL/min   GFR calc Af Amer >60 >60 mL/min   Anion gap 9 5 - 15    Comment: Performed at [facility] [address], [place], [place] [address]  Lipase, blood     Status: None   Collection Time: 01/02/19  6:08 PM  Result Value Ref Range   Lipase 30 11 - 51 U/L    Comment: Performed at [facility], [place] [address] [place], [place] [address]  CBC with Differential     Status: Abnormal   Collection Time: 01/02/19  6:08 PM  Result Value Ref Range   WBC 8.4 4.0 - 10.5 K/uL   RBC 3.67 (L) 3.87 - 5.11 MIL/uL   Hemoglobin 12.2 12.0 - 15.0 g/dL   HCT 39.0 36.0 - 46.0 %   MCV 106.3 (H) 80.0 - 100.0 fL   MCH 33.2 26.0 - 34.0 pg   MCHC 31.3 30.0 - 36.0 g/dL   RDW 13.2 11.5 - 15.5 %   Platelets 241 150 - 400 K/uL   nRBC 0.0 0.0 - 0.2 %   Neutrophils Relative % 81 %   Neutro Abs 6.8 1.7 - 7.7 K/uL   Lymphocytes Relative 12 %   Lymphs Abs 1.0 0.7 - 4.0 K/uL   Monocytes Relative 7 %   Monocytes Absolute 0.6 0.1 - 1.0 K/uL   Eosinophils Relative 0 %   Eosinophils Absolute 0.0 0.0 - 0.5 K/uL   Basophils Relative 0 %   Basophils Absolute 0.0 0.0 - 0.1 K/uL   Immature Granulocytes 0 %   Abs Immature Granulocytes 0.03 0.00 - 0.07 K/uL    Comment: Performed at [facility] [address] [place], [place] [address]  Urinalysis, Routine w reflex microscopic     Status: Abnormal   Collection Time: 01/02/19  6:28 PM  Result Value Ref Range   Color, Urine YELLOW YELLOW   APPearance CLEAR CLEAR   Specific Gravity, Urine 1.020 1.005 - 1.030   pH  6.0 5.0 - 8.0   Glucose, UA NEGATIVE NEGATIVE mg/dL   Hgb urine dipstick MODERATE (A) NEGATIVE   Bilirubin Urine NEGATIVE NEGATIVE   Ketones, ur 20 (A) NEGATIVE mg/dL   Protein, ur 30 (A) NEGATIVE mg/dL   Nitrite NEGATIVE NEGATIVE   Leukocytes,Ua SMALL (A) NEGATIVE   RBC / HPF 0-5 0 - 5 RBC/hpf   WBC, UA 11-20 0 - 5 WBC/hpf   Bacteria, UA NONE SEEN NONE SEEN   Squamous Epithelial / LPF 0-5 0 - 5   Mucus PRESENT     Comment: Performed at [facility], [place] [address], [place],  [address]   Dg Abd Acute 2+v W 1v Chest  Result Date: 01/02/2019 CLINICAL DATA:  Stomach pain. EXAM: DG ABDOMEN ACUTE W/ 1V CHEST COMPARISON:  Aug 17, 2018 chest radiograph December 31, 2018, abdominal CT. FINDINGS: There is no evidence of dilated bowel loops or free intraperitoneal air. No radiopaque calculi or other significant radiographic abnormality is seen. Enlarged cardiac silhouette. Cardiac pacemaker in stable position. Both lungs are clear. IMPRESSION: 1. Nonobstructive bowel gas pattern. 2. No acute cardiopulmonary disease. 3. Enlarged cardiac silhouette. Electronically Signed   By: Fidela Salisbury M.D.   On: 01/02/2019 18:19    Pending Labs Unresulted Labs (From admission, onward)    Start     Ordered   01/03/19 0500  CBC  Tomorrow morning,   R     01/02/19  2148   01/03/19 0500  Comprehensive metabolic panel  Tomorrow morning,   R     01/02/19 2148   01/02/19 2105  SARS CORONAVIRUS 2 (TAT 6-24 HRS) Nasopharyngeal Nasopharyngeal Swab  (Asymptomatic/Tier 2 Patients Labs)  Once,   STAT    Question Answer Comment  Is this test for diagnosis or screening Screening   Symptomatic for COVID-19 as defined by CDC No   Hospitalized for COVID-19 No   Admitted to ICU for COVID-19 No   Previously tested for COVID-19 Yes   Resident in a congregate (group) care setting No   Employed in healthcare setting No   Pregnant No      01/02/19 2104   01/02/19 1719  Urine culture  ONCE - STAT,   STAT      01/02/19 1718          Vitals/Pain Today's Vitals   01/02/19 2030 01/02/19 2300 01/02/19 2315 01/02/19 2324  BP: (!) 191/73 (!) 185/78 (!) 190/73   Pulse:   65   Resp: _0 Temp:      TempSrc:      SpO2:   94%   Weight:      Height:      PainSc:    0-No pain    Isolation Precautions No active isolations  Medications Medications  0.9 %  sodium chloride infusion ( Intravenous New Bag/Given 01/02/19 2304)  senna-docusate (Senokot-S) tablet 1 tablet (has no administration in time range)  promethazine (PHENERGAN) tablet 12.5 mg (has no administration in time range)  dicyclomine (BENTYL) 10 MG/5ML solution 10 mg (has no administration in time range)  albuterol (PROVENTIL) (2.5 MG/3ML) 0.083% nebulizer solution 2.5 mg (has no administration in time range)  alum & mag hydroxide-simeth (MAALOX/MYLANTA) 200-200-20 MG/5ML suspension 20 mL (has no administration in time range)  amiodarone (PACERONE) tablet 200 mg (has no administration in time range)  atorvastatin (LIPITOR) tablet 20 mg (has no administration in time range)  mometasone-formoterol (DULERA) 200-5 MCG/ACT inhaler 2 puff (has no administration in time range)  busPIRone (BUSPAR) tablet 7.5 mg (has no administration in time range)  gabapentin (NEURONTIN) capsule 300 mg (has no administration in time range)  gabapentin (NEURONTIN) capsule 600 mg (has no administration in time range)  levothyroxine (SYNTHROID) tablet 75 mcg (has no administration in time range)  Melatonin TABS 3 mg (has no administration in time range)  sertraline (ZOLOFT) tablet 50 mg (has no administration in time range)  topiramate (TOPAMAX) tablet 25 mg (has no administration in time range)  pantoprazole (PROTONIX) EC tablet 40 mg (has no administration in time range)  apixaban (ELIQUIS) tablet 2.5 mg (has no administration in time range)  lactated ringers infusion (has no administration in time range)  fentaNYL (SUBLIMAZE) injection 50 mcg (50  mcg Intravenous Given 01/02/19 1730)  sodium chloride 0.9 % bolus 1,000 mL (0 mLs Intravenous Stopping Infusion hung by another clincian 01/02/19 2317)  promethazine (PHENERGAN) injection 12.5 mg (12.5 mg Intravenous Given 01/02/19 1800)  potassium chloride SA (KLOR-CON) CR tablet 40 mEq (40 mEq Oral Given 01/02/19 2042)  cephALEXin (KEFLEX) capsule 500 mg (500 mg Oral Given 01/02/19 2042)  alum & mag hydroxide-simeth (MAALOX/MYLANTA) 200-200-20 MG/5ML suspension 30 mL (30 mLs Oral Given 01/02/19 2301)    Mobility walks with person assist Low fall risk   Focused Assessments GI   R Recommendations: See Admitting Provider Note  Report given to:   Additional Notes: -

## 2019-01-03 NOTE — Evaluation (Signed)
Physical Therapy Evaluation Patient Details Name: Meredith Gonzalez MRN: [id number] DOB: February 16, 1936 Today's Date: 01/03/2019   History of Present Illness  [name] is an 83 y.o female with atrial fibrillation, hx of recurrent strokes/TIA, CAD, HFpEF, hypothyroidism, HTN, MDD, DM2 who presented with 3 day history of nausea, vomiting, and difficulty getting anything down.    Clinical Impression  Pt with flat affect. Not talkative and not wanting to walk.  Pt did agree to OOB and marching in place.  Pt weak - had to sit down quickly. s he reports hurting all over when standing (no pain at rest).  Pt is FTT.  She will benefit from PT - hour every day at SNF to help her increase her activity level and leg strength.  Pt in agreement to plan.  PT will continue to see pt while she is in the hospital.    Follow Up Recommendations SNF;Supervision/Assistance - 24 hour    Equipment Recommendations  None recommended by PT    Recommendations for Other Services       Precautions / Restrictions Precautions Precautions: Fall Precaution Comments: pt denies any recent falls. Restrictions Weight Bearing Restrictions: No      Mobility  Bed Mobility Overal bed mobility: Needs Assistance Bed Mobility: Supine to Sit     Supine to sit: Min guard     General bed mobility comments: Pt tried to sit up - putting feet over edge of bed and forcing trunk up. I educated her on improved technque - rolling onto her side and then pushing up - much easier.  Transfers Overall transfer level: Needs assistance Equipment used: Rolling walker (2 wheeled) Transfers: Sit to/from Stand Sit to Stand: Min assist         General transfer comment: pt requires min A for safety and balance  Ambulation/Gait Ambulation/Gait assistance: Min assist Gait Distance (Feet): 4 Feet Assistive device: Rolling walker (2 wheeled)       General Gait Details: pt agreed to marching side of bed only with RW. she side stepped to  get higher in bed and did 4 marches and then sat back down quickly saying it hurt all over.  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)       Balance Overall balance assessment: Needs assistance   Sitting balance-Leahy Scale: Fair Sitting balance - Comments: pt sat EOB with hands in her lap - no effort for safe static sitting     Standing balance-Leahy Scale: Poor Standing balance comment: pt stood with RW only - balance not tested but she sat quickly on bed with poor control                             Pertinent Vitals/Pain Pain Assessment: (pt reports having no pain and no history of pain - then when standing - she reprots she had pain all over and had to sit down)    Home Living Family/patient expects to be discharged to:: Skilled nursing facility                      Prior Function     Gait / Transfers Assistance Needed: Pt was home with son - had aide during day. Always had someone with her.  used RW all the time when up  Hand Dominance        Extremity/Trunk Assessment   Upper Extremity Assessment Upper Extremity Assessment: Defer to OT evaluation    Lower Extremity Assessment Lower Extremity Assessment: Generalized weakness    Cervical / Trunk Assessment Cervical / Trunk Assessment: Kyphotic  Communication   Communication: HOH;No difficulties  Cognition Arousal/Alertness: Lethargic Behavior During Therapy: Flat affect Overall Cognitive Status: No family/caregiver present to determine baseline cognitive functioning                                 General Comments: pt was sleeping when i arrived. she was agreeable but not talkative adn not wanting to do much.      General Comments General comments (skin integrity, edema, etc.): pt appears very weak and not talkative.  we talked about her going to SNF to get stronger -s he is in  agreement    Exercises     Assessment/Plan    PT Assessment Patient needs continued PT services  PT Problem List Decreased strength;Decreased mobility;Decreased safety awareness;Decreased knowledge of precautions;Decreased activity tolerance;Decreased cognition;Cardiopulmonary status limiting activity;Decreased balance;Pain       PT Treatment Interventions DME instruction;Therapeutic activities;Gait training;Therapeutic exercise;Patient/family education;Balance training;Functional mobility training    PT Goals (Current goals can be found in the Care Plan section)  Acute Rehab PT Goals Patient Stated Goal: pt doesnt report any goals PT Goal Formulation: With patient Time For Goal Achievement: 01/17/19 Potential to Achieve Goals: Poor    Frequency Min 3X/week   Barriers to discharge   pt not doing well at home - son thinking she will progress with SNF level PT - hour a day    Co-evaluation               AM-PAC PT "6 Clicks" Mobility  Outcome Measure Help needed turning from your back to your side while in a flat bed without using bedrails?: A Little Help needed moving from lying on your back to sitting on the side of a flat bed without using bedrails?: A Little Help needed moving to and from a bed to a chair (including a wheelchair)?: A Little Help needed standing up from a chair using your arms (e.g., wheelchair or bedside chair)?: A Little Help needed to walk in hospital room?: A Lot Help needed climbing 3-5 steps with a railing? : A Lot 6 Click Score: 16    End of Session Equipment Utilized During Treatment: Gait belt Activity Tolerance: Patient limited by fatigue Patient left: in bed;with bed alarm set;with call bell/phone within reach Nurse Communication: Mobility status PT Visit Diagnosis: Unsteadiness on feet (R26.81);Muscle weakness (generalized) (M62.81);Adult, failure to thrive (R62.7);Difficulty in walking, not elsewhere classified (R26.2)    Time:  1436-1500 PT Time Calculation (min) (ACUTE ONLY): 24 min   Charges:   PT Evaluation $PT Eval Low Complexity: 1 Low PT Treatments $Therapeutic Activity: 8-22 mins       01/03/2019   Rande Lawman, PT   [place], [place] 01/03/2019, 3:13 PM

## 2019-01-03 NOTE — Progress Notes (Signed)
Occupational Therapy Evaluation Patient Details Name: Meredith Gonzalez MRN: [id number] DOB: 1935/10/07 Today's Date: 01/03/2019    History of Present Illness [name] is an 83 y.o female with atrial fibrillation, hx of recurrent strokes/TIA, CAD, HFpEF, hypothyroidism, HTN, MDD, DM2 who presented with 3 day history of nausea, vomiting, and difficulty getting anything down.   Clinical Impression   PTA, pt lived in son's home in basement apartment, has home health aide that comes daily to assist with BADLs and IADLs; information collected via phone conversation with son. Pt currently presents with decreased strength, balance, activity tolerance, and increased dizziness and nausea. Pt requires supervision-min guard A for UB ADLs, and mod A- max A for LB ADLs. Pt performed functional transfers with min A for safety and balance.  Pt would benefit from continued OT services to address deficits with strength, balance, and activity tolerance. Recommend dc to SNF to address impairments and increase safe performance of ADLs.     Follow Up Recommendations  SNF;Other (comment)(Pt son requests [facility]) Pt would benefit highly from [facility] if qualifies.     Equipment Recommendations  Other (comment)(defer to next venue)    Recommendations for Other Services PT consult     Precautions / Restrictions Precautions Precautions: Fall Restrictions Weight Bearing Restrictions: No      Mobility Bed Mobility Overal bed mobility: Modified Independent             General bed mobility comments: required increased time and effort  Transfers Overall transfer level: Needs assistance Equipment used: Rolling walker (2 wheeled) Transfers: Sit to/from Stand Sit to Stand: Min assist         General transfer comment: pt requires min A for safety and balance    Balance Overall balance assessment: Needs assistance Sitting-balance support: No upper extremity supported;Feet supported Sitting  balance-Leahy Scale: Fair     Standing balance support: Bilateral upper extremity supported;During functional activity Standing balance-Leahy Scale: Poor Standing balance comment: Pt requires BUE support to maintain static standing balance                           ADL either performed or assessed with clinical judgement   ADL Overall ADL's : Needs assistance/impaired Eating/Feeding: Modified independent Eating/Feeding Details (indicate cue type and reason): increased time Grooming: Modified independent;Sitting Grooming Details (indicate cue type and reason): requires increased time Upper Body Bathing: Sitting;Min guard   Lower Body Bathing: Moderate assistance;Sit to/from stand Lower Body Bathing Details (indicate cue type and reason): requires mod A for balance and safety Upper Body Dressing : Min guard;Sitting   Lower Body Dressing: Sit to/from stand;Maximal assistance Lower Body Dressing Details (indicate cue type and reason): requires max A to don LB clothing Toilet Transfer: Minimal assistance;RW;Ambulation(simulated to recliner) Toilet Transfer Details (indicate cue type and reason): pt requires min A for safety and balance Toileting- Clothing Manipulation and Hygiene: Maximal assistance;Sit to/from stand Toileting - Clothing Manipulation Details (indicate cue type and reason): max A for safety and balance     Functional mobility during ADLs: Minimal assistance;Rolling walker General ADL Comments: Pt requires min A for functional transfers, supervision-min guard A for UB ADLs, and mod A-max A for LB ADLs     Vision         Perception     Praxis      Pertinent Vitals/Pain Pain Assessment: No/denies pain     Hand Dominance Right   Extremity/Trunk Assessment Upper Extremity Assessment Upper  Extremity Assessment: Generalized weakness   Lower Extremity Assessment Lower Extremity Assessment: Defer to PT evaluation   Cervical / Trunk  Assessment Cervical / Trunk Assessment: Kyphotic   Communication Communication Communication: HOH;No difficulties   Cognition Arousal/Alertness: Awake/alert Behavior During Therapy: WFL for tasks assessed/performed Overall Cognitive Status: No family/caregiver present to determine baseline cognitive functioning                                 General Comments: Per son, pt reporting some unreliable information for level of prior function   General Comments  Pt appeared very weak and nauseous. Pt son requests SNF for increased rehab before she returns home    Exercises     Shoulder Instructions      Home Living Family/patient expects to be discharged to:: Private residence Living Arrangements: Children Available Help at Discharge: Family;Available PRN/intermittently;Personal care attendant Type of Home: House       Home Layout: Two level;Other (Comment)(Pt lives in the basement) Alternate Level Stairs-Number of Steps: 27   Bathroom Shower/Tub: Occupational psychologist: Standard     Home Equipment: Environmental consultant - 2 wheels   Additional Comments: She has an aide that assist her with IADL tasks from 10- 4 pm / 7 days a week      Prior Functioning/Environment Level of Independence: Needs assistance  Gait / Transfers Assistance Needed: per son, pt uses RW ADL's / Homemaking Assistance Needed: Per son, reports that he assists with BADLs   Comments: Aide comes to assist every day from 9:30-3:30 with IADL tasks        OT Problem List: Decreased strength;Decreased range of motion;Decreased activity tolerance;Impaired balance (sitting and/or standing);Decreased safety awareness;Decreased knowledge of use of DME or AE;Decreased knowledge of precautions      OT Treatment/Interventions: Self-care/ADL training;DME and/or AE instruction;Patient/family education;Balance training    OT Goals(Current goals can be found in the care plan section) Acute Rehab OT  Goals Patient Stated Goal: Go home OT Goal Formulation: With patient Time For Goal Achievement: 01/17/19 Potential to Achieve Goals: Good  OT Frequency: Min 2X/week   Barriers to D/C:    Per phone conversation with son, he requests SNF for increased rehab and time before returning home       Co-evaluation              AM-PAC OT "6 Clicks" Daily Activity     Outcome Measure Help from another person eating meals?: None Help from another person taking care of personal grooming?: A Little Help from another person toileting, which includes using toliet, bedpan, or urinal?: A Lot Help from another person bathing (including washing, rinsing, drying)?: A Lot Help from another person to put on and taking off regular upper body clothing?: A Little Help from another person to put on and taking off regular lower body clothing?: A Lot 6 Click Score: 16   End of Session Equipment Utilized During Treatment: Rolling walker;Gait belt Nurse Communication: Mobility status  Activity Tolerance: Patient limited by fatigue;Patient tolerated treatment well Patient left: in chair;with call bell/phone within reach  OT Visit Diagnosis: Unsteadiness on feet (R26.81);Other abnormalities of gait and mobility (R26.89);Muscle weakness (generalized) (M62.81);Dizziness and giddiness (R42)                Time: [id number] OT Time Calculation (min): 32 min Charges:  OT General Charges $OT Visit: 1 Visit OT Evaluation $OT Eval Moderate Complexity: 1  Mod OT Treatments $Self Care/Home Management : 8-22 mins  Gus Rankin, OT Student  Gus Rankin 01/03/2019, 9:51 AM

## 2019-01-03 NOTE — ED Notes (Signed)
Eunice RN [phone] report given but unable to accept pt d/t pend COVID results.

## 2019-01-04 DIAGNOSIS — R627 Adult failure to thrive: Secondary | ICD-10-CM

## 2019-01-04 DIAGNOSIS — E46 Unspecified protein-calorie malnutrition: Secondary | ICD-10-CM

## 2019-01-04 DIAGNOSIS — I4891 Unspecified atrial fibrillation: Secondary | ICD-10-CM

## 2019-01-04 LAB — BASIC METABOLIC PANEL
Anion gap: 6 (ref 5–15)
BUN: 10 mg/dL (ref 8–23)
CO2: 22 mmol/L (ref 22–32)
Calcium: 7.7 mg/dL — ABNORMAL LOW (ref 8.9–10.3)
Chloride: 112 mmol/L — ABNORMAL HIGH (ref 98–111)
Creatinine, Ser: 0.76 mg/dL (ref 0.44–1.00)
GFR calc Af Amer: >60 mL/min (ref >60)
GFR calc non Af Amer: >60 mL/min (ref >60)
Glucose, Bld: 93 mg/dL (ref 70–99)
Potassium: 3.5 mmol/L (ref 3.5–5.1)
Sodium: 140 mmol/L (ref 135–145)

## 2019-01-04 LAB — CBC
HCT: 35 % — ABNORMAL LOW (ref 36.0–46.0)
Hemoglobin: 11 g/dL — ABNORMAL LOW (ref 12.0–15.0)
MCH: 33 pg (ref 26.0–34.0)
MCHC: 31.4 g/dL (ref 30.0–36.0)
MCV: 105.1 fL — ABNORMAL HIGH (ref 80.0–100.0)
Platelets: 236 10*3/uL (ref 150–400)
RBC: 3.33 MIL/uL — ABNORMAL LOW (ref 3.87–5.11)
RDW: 13.5 % (ref 11.5–15.5)
WBC: 7.6 10*3/uL (ref 4.0–10.5)
nRBC: 0 % (ref 0.0–0.2)

## 2019-01-04 LAB — VITAMIN B12: Vitamin B-12: 2588 pg/mL — ABNORMAL HIGH (ref 180–914)

## 2019-01-04 MED ORDER — FOLIC ACID 1 MG PO TABS
1.0000 mg | ORAL_TABLET | Freq: Every day | ORAL | Status: DC
  Administered 2019-01-04 – 2019-01-05 (×2): 1 mg via ORAL
  Filled 2019-01-04 (×2): qty 1

## 2019-01-04 MED ORDER — VITAMIN B-1 100 MG PO TABS
100.0000 mg | ORAL_TABLET | Freq: Every day | ORAL | Status: DC
  Administered 2019-01-04 – 2019-01-05 (×2): 100 mg via ORAL
  Filled 2019-01-04 (×2): qty 1

## 2019-01-04 MED ORDER — PROSIGHT PO TABS
1.0000 | ORAL_TABLET | Freq: Every day | ORAL | Status: DC
  Administered 2019-01-04 – 2019-01-05 (×2): 1 via ORAL
  Filled 2019-01-04 (×2): qty 1

## 2019-01-04 NOTE — Progress Notes (Signed)
   NAME:  Meredith Gonzalez, MRN:  [id number], DOB:  Nov 03, 1935, LOS: 1 ADMISSION DATE:  01/02/2019, Primary: Sinclair Ship, MD  CHIEF COMPLAINT: weakness   Medical Service: Internal Medicine Teaching Service         Attending Physician: Dr. Velna Ochs, MD    First Contact: Dr. Darrick Meigs Pager: [phone]  Second Contact: Dr. Sharon Seller Pager: [phone]       After Hours (After 5p/  First Contact Pager: [phone]  weekends / holidays): Second Contact Pager: [phone]    Brief History  83 yo female with PMH of a fib, numerous strokes, CAD, HFpEF, hypothyroidism, HTN, MDD, DM2 presenting with 1y hx of weight loss and loss of appetite. Subjective  No events overnight  Objective   Blood pressure (!) 175/78, pulse 77, temperature 98.3 F (36.8 C), temperature source Oral, resp. rate 18, height 5\' 3"  (1.6 m), weight 52.7 kg, SpO2 95 %.     Intake/Output Summary (Last 24 hours) at 01/04/2019 0548 Last data filed at 01/04/2019 0300 Gross per 24 hour  Intake 2410.83 ml  Output 700 ml  Net 1710.83 ml   Filed Weights   01/02/19 1917  Weight: 52.7 kg    Examination: General: in no acute distress Heart: RRR Lungs: clear to auscultation Abdomen: soft. Bs active. Non tender. Psych: flat affect  Consults:  none  Significant Diagnostic Tests:  10/10 CXR: unremarkable for acute cardiopulmonary process  10/10 abd xray: unremarkable  Micro Data:  10/10 urine culture>> no significant growth  Antimicrobials:  none  Labs    CBC Latest Ref Rng & Units 01/04/2019 01/03/2019 01/02/2019  WBC 4.0 - 10.5 K/uL 7.6 8.3 8.4  Hemoglobin 12.0 - 15.0 g/dL 11.0(L) 11.7(L) 12.2  Hematocrit 36.0 - 46.0 % 35.0(L) 37.3 39.0  Platelets 150 - 400 K/uL 236 245 241  B12 2600  Stable/resolved problems  Atrial fibrillation-continue amiodarone and eliquis Recurrent strokes/CAD-continue statin Hypothyroidism-continue synthroid  Summary  83 yo female with one year history of progressive weight loss,  weakness and anorexia. N/v x3d.  Assessment & Plan:  Active Problems:   Vomiting   Loss of weight   MDD/failure to thrive. Anorexia x1 year. Increased zoloft to 100mg  daily on 10/11. Plan Continue zoloft 100mg  Continue buspar Encourage po intake  Macrocytic anemia. hgb 11.7 on admission. MCV 105. Folate deficiency? Possibly from poor po intake? Consider starting folate supplement. Will defer to outpatient primary for this.  HTN. Blood pressures fairly elevated in the 170s-180s. Last blood pressure 139/61.  Doesn't appear to be on anything at home for this. Will continue to monitor and treat accordingly.  Deconditioning. Likely multifactorial including poor nutrition, MDD. PT/OT ordered.   Best practice:  CODE STATUS: DNR Diet: heart healthy Pain management: tylenol GI prophylaxis: protonix DVT for prophylaxis: on eliquis Family communication: son agrees with SNF placement for rehab Dispo: SNF/rehab. SW on board. Level 2 sent.   Mitzi Hansen, MD INTERNAL MEDICINE RESIDENT PGY-1 PAGER #: [phone] 01/04/19 5:48 AM

## 2019-01-04 NOTE — TOC Progression Note (Signed)
Transition of Care [facility]) - Progression Note    Patient Details  Name: Nylene Nan MRN: [id number] Date of Birth: 17-Oct-1935  Transition of Care [facility]) CM/SW [place], [place] Phone Number: 01/04/2019, 2:19 PM  Clinical Narrative:    CSW confirmed with Elyse Hsu at [facility]) that pt has used 42/100 days. They are able to offer placement. They are checking on the waiver for 3 midnight.   Pt son updated via telephone. We discussed again that although pt has 68 days remaining under Medicare coverage that it will only be covered by Medicare as long as it is medically necessary and pt is continuing to meet goals. Pt son states understanding.   He just spoke with MD team, he is still concerned about pt weakness and he knows they are making medication changes. Encouraged pt son to keep in touch with pt PCP in order to make sure all changes made while inpatient are continuing to service in pt best interest and that he knows someone is monitoring ongoing changes.    Expected Discharge Plan: Skilled Nursing Facility Barriers to Discharge: SNF Pending bed offer, Ship broker, Continued Medical Work up  Expected Discharge Plan and Services Expected Discharge Plan: [place] In-house Referral: Clinical Social Work Discharge Planning Services: CM Consult Post Acute Care Choice: [place] Living arrangements for the past 2 months: Single Family Home   Social Determinants of Health (SDOH) Interventions    Readmission Risk Interventions Readmission Risk Prevention Plan 01/04/2019 08/19/2018  Transportation Screening Complete Complete  Medication Review Press photographer) Complete Complete  PCP or Specialist appointment within 3-5 days of discharge Not Complete Complete  PCP/Specialist Appt Not Complete comments plan for SNF -  HRI or Home Care Consult Not Complete Complete  HRI or Home Care Consult Pt Refusal Comments plan for  SNF -  SW Recovery Care/Counseling Consult Complete Complete  Palliative Care Screening Not Applicable Not Applicable  Skilled Nursing Facility Complete Complete  Some recent data might be hidden

## 2019-01-04 NOTE — NC FL2 (Signed)
[place] LEVEL OF CARE SCREENING TOOL     IDENTIFICATION  Patient Name: Meredith Gonzalez Birthdate: 1935/04/15 Sex: female Admission Date (Current Location): 01/02/2019  [facility] and [place] Number:  Herbalist and Address:  [facility] [place]. [facility], [place] [address], [place], [place] [address]      Provider Number: [id number]  Attending Physician Name and Address:  Velna Ochs, MD  Relative Name and Phone Number:       Current Level of Care: Hospital Recommended Level of Care: [place] Prior Approval Number:    Date Approved/Denied:   PASRR Number: [id number]  Discharge Plan: SNF    Current Diagnoses: Patient Active Problem List   Diagnosis Date Noted  . Loss of weight   . Vomiting 01/02/2019  . Closed fracture of left superior pubic ramus, initial encounter 10/18/2018  . Dehydration 08/18/2018  . Dizziness 07/09/2018  . Stroke due to embolism of carotid artery ([place]) 06/05/2018  . Stroke-like symptom   . Dyspnea   . Acute blood loss anemia   . Acute ischemic stroke ([place]) 05/31/2018  . Stroke ([place]) 05/30/2018  . Chronic diastolic congestive heart failure ([place])   . Anemia of chronic disease   . Hypoalbuminemia due to protein-calorie malnutrition ([place])   . Labile blood pressure   . Atrial fibrillation ([place])   . Chronic neck pain   . Subcortical infarction ([place]) 04/27/2018  . CVA (cerebral vascular accident) ([place]) 04/26/2018  . TIA (transient ischemic attack) 04/24/2018  . CAD (coronary artery disease) 08/07/2016  . (HFpEF) heart failure with preserved ejection fraction ([place]) 08/07/2016  . Ischemic cardiomyopathy 08/07/2016  . Chest pain   . NSTEMI (non-ST elevated myocardial infarction) ([place])   . Status post coronary artery stent placement   . Abnormal stress test   . SOB (shortness of breath)   . Multifocal pneumonia 07/13/2016  . Elevated troponin 07/13/2016  . Leukocytosis   . Pacemaker 05/17/2014   . Complications, pacemaker cardiac, mechanical 12/31/2012  . Edema extremities 10/08/2012  . Persistent atrial fibrillation ([place]) 10/08/2012  . Hypertension 10/08/2012  . Hypothyroidism 10/08/2012  . Neuropathy 10/08/2012  . Diabetes ([place]) 10/08/2012  . Depression 09/08/2012  . Hypokalemia 08/08/2012  . Anemia 08/08/2012  . Knee fracture, left 08/07/2012  . Recurrent falls 08/07/2012  . Syncope 08/07/2012  . SDH (subdural hematoma) ([place]) 08/07/2012    Orientation RESPIRATION BLADDER Height & Weight     Self, Situation, Place  Normal Continent Weight: 116 lb 2.9 oz (52.7 kg) Height:  5\' 3"  (160 cm)  BEHAVIORAL SYMPTOMS/MOOD NEUROLOGICAL BOWEL NUTRITION STATUS      Continent Diet(see discharge summary)  AMBULATORY STATUS COMMUNICATION OF NEEDS Skin   Extensive Assist Verbally Other (Comment)(generalized ecchymosis)                       Personal Care Assistance Level of Assistance  Bathing, Feeding, Dressing Bathing Assistance: Maximum assistance Feeding assistance: Limited assistance Dressing Assistance: Maximum assistance     Functional Limitations Info  Sight, Hearing, Speech Sight Info: Impaired Hearing Info: Adequate Speech Info: Adequate    SPECIAL CARE FACTORS FREQUENCY  PT (By licensed PT), OT (By licensed OT)     PT Frequency: 5x week OT Frequency: 5x week            Contractures Contractures Info: Not present    Additional Factors Info  Allergies, Code Status, Psychotropic Code Status Info: DNR Allergies Info: Valium (Diazepam), Robaxin (Methocarbamol),  Tramadol, Codeine, Darifenacin, Darvon (Propoxyphene Hcl), Daypro (Oxaprozin), Enablex (Darifenacin Hydrobromide Er), Oxycodone, Propoxyphene, Risperdal (Risperidone), [place] (Pentazocine), Vicodin (Hydrocodone-acetaminophen) Psychotropic Info: busPIRone (BUSPAR) tablet 7.5 mg 2x daily PO; sertraline (ZOLOFT) tablet 100 mg daily PO         Current Medications (01/04/2019):  This is the  current hospital active medication list Current Facility-Administered Medications  Medication Dose Route Frequency Provider Last Rate Last Dose  . 0.9 %  sodium chloride infusion   Intravenous Continuous Seawell, Jaimie A, DO 75 mL/hr at 01/04/19 0732    . albuterol (PROVENTIL) (2.5 MG/3ML) 0.083% nebulizer solution 2.5 mg  2.5 mg Inhalation Q6H PRN Chundi, Vahini, MD      . alum & mag hydroxide-simeth (MAALOX/MYLANTA) 200-200-20 MG/5ML suspension 20 mL  20 mL Oral Q6H PRN Chundi, Vahini, MD      . amiodarone (PACERONE) tablet 200 mg  200 mg Oral Daily Chundi, Vahini, MD   200 mg at 01/03/19 0924  . apixaban (ELIQUIS) tablet 2.5 mg  2.5 mg Oral BID Chundi, Vahini, MD   2.5 mg at 01/03/19 2111  . atorvastatin (LIPITOR) tablet 20 mg  20 mg Oral Daily Chundi, Vahini, MD   20 mg at 01/03/19 0923  . busPIRone (BUSPAR) tablet 7.5 mg  7.5 mg Oral BID Chundi, Vahini, MD   7.5 mg at 01/03/19 2110  . gabapentin (NEURONTIN) capsule 300 mg  300 mg Oral BID WC Chundi, Vahini, MD   300 mg at 01/03/19 1708  . gabapentin (NEURONTIN) capsule 600 mg  600 mg Oral QHS Chundi, Vahini, MD   600 mg at 01/03/19 2111  . levothyroxine (SYNTHROID) tablet 75 mcg  75 mcg Oral Q0600 Lars Mage, MD   75 mcg at 01/04/19 0625  . Melatonin TABS 3 mg  3 mg Oral QHS Chundi, Vahini, MD   3 mg at 01/03/19 2110  . mometasone-formoterol (DULERA) 200-5 MCG/ACT inhaler 2 puff  2 puff Inhalation BID Lars Mage, MD   2 puff at 01/04/19 0802  . pantoprazole (PROTONIX) EC tablet 40 mg  40 mg Oral BID Lars Mage, MD   40 mg at 01/03/19 2111  . promethazine (PHENERGAN) tablet 12.5 mg  12.5 mg Oral Q6H PRN Chundi, Vahini, MD   12.5 mg at 01/03/19 0930  . senna-docusate (Senokot-S) tablet 1 tablet  1 tablet Oral QHS PRN Chundi, Vahini, MD      . sertraline (ZOLOFT) tablet 100 mg  100 mg Oral Daily Mitzi Hansen, MD         Discharge Medications: Please see discharge summary for a list of discharge medications.  Relevant  Imaging Results:  Relevant Lab Results:   Additional Information SS#: [id number]  Alexander Mt, LCSWA

## 2019-01-04 NOTE — Progress Notes (Signed)
MD on call paged that patient is requesting home medication Calcitonin [place] nasal spray. Arthor Captain LPN

## 2019-01-04 NOTE — TOC Progression Note (Addendum)
Transition of Care [facility]) - Progression Note    Patient Details  Name: Meredith Gonzalez MRN: [id number] Date of Birth: February 02, 1936  Transition [facility]) CM/SW [place], [place] Phone Number: 01/04/2019, 11:35 AM  Clinical Narrative:    CSW received a call back from pt son Lennette Bihari He confirms pt from home with him and his wife. They have built a live in basement apartment for her but he feels like she has progressively gotten weaker and he would like SNF placement. Pt has recently been discharged from [facility] last month ([place]).   CSW explained 60 day wellness period, 100 day Medicare/Supplement coverage (coverage when it is medically necessary vs. when Medicare may no longer pay if pt has met goals/is not meeting them and it may be private pay). Pt has been receiving [facility] therapies/RN but he feels this is not enough and would like more clarification on SNF coverage.   CSW has sent referral and therapy notes to [facility] and discussed case with Elyse Hsu. Elyse Hsu (admissions director) will review Medicare days and pt referral. Should pt be out of 100 Medicare coverage days pt son understands that SNF would be private pay vs returning home with [facility] therapies.    Pt son expressed concern that pt is documented as medically stable for dc and CSW has let pt MD know that he would like a call if possible.   Expected Discharge Plan: Skilled Nursing Facility Barriers to Discharge: SNF Pending bed offer, Ship broker, Continued Medical Work up  Expected Discharge Plan and Services Expected Discharge Plan: [place] In-house Referral: Clinical Social Work Discharge Planning Services: CM Consult Post Acute Care Choice: [place] Living arrangements for the past 2 months: Single Family Home   Social Determinants of Health (SDOH) Interventions    Readmission Risk Interventions Readmission Risk  Prevention Plan 01/04/2019 08/19/2018  Transportation Screening Complete Complete  Medication Review Press photographer) Complete Complete  PCP or Specialist appointment within 3-5 days of discharge Not Complete Complete  PCP/Specialist Appt Not Complete comments plan for SNF -  HRI or Home Care Consult Not Complete Complete  HRI or Home Care Consult Pt Refusal Comments plan for SNF -  SW Recovery Care/Counseling Consult Complete Complete  Palliative Care Screening Not Applicable Not Applicable  Skilled Nursing Facility Complete Complete  Some recent data might be hidden

## 2019-01-04 NOTE — TOC Initial Note (Addendum)
Transition of Care [facility]) - Initial/Assessment Note    Patient Details  Name: Meredith Gonzalez MRN: [id number] Date of Birth: Apr 16, 1935  Transition of Care [facility]) CM/SW Contact:    Alexander Mt, [place] Phone Number: 01/04/2019, 11:27 AM  Clinical Narrative:                 CSW spoke with pt at bedside. Introduced self, role, reason for visit. Pt confirms that she is from hom with her son and usually utilizes a walker to get around the home.  We discussed referral for SNF based on PT recommendations, pt confirms she has been to [facility] in [place]) before but did not indicate if she was amenable to going back- defers to her son.   CSW called and left HIPAA compliant message on son Phillicia Delaroca voicemail [phone]). Await return call. Per MD notes pt stable for dc however must confirm SNF preference and will need to ensure whether or not pt needs 3 midnight inpatient stay (whether or not Medicare waiver program is still in effect)  Expected Discharge Plan: Skilled Nursing Facility Barriers to Discharge: SNF Pending bed offer, Ship broker, Continued Medical Work up   Patient Goals and CMS Choice   CMS Medicare.gov Compare Post Acute Care list provided to:: Patient Represenative (must comment)(pt son) Choice offered to / list presented to : Adult Children  Expected Discharge Plan and Services Expected Discharge Plan: Skilled Nursing Facility In-house Referral: Clinical Social Work Discharge Planning Services: CM Consult Post Acute Care Choice: [place] Living arrangements for the past 2 months: [place]  Prior Living Arrangements/Services Living arrangements for the past 2 months: Single Family Home Lives with:: Adult Children Patient language and need for interpreter reviewed:: Yes(no needs) Do you feel safe going back to the place where you live?: Yes      Need for Family Participation in Patient Care: Yes  (Comment)(assistance as needed with ADL/IADLs; supervision) Care giver support system in place?: Yes (comment)(adult children) Current home services: DME, Home OT, Home PT Criminal Activity/Legal Involvement Pertinent to Current Situation/Hospitalization: No - Comment as needed  Activities of Daily Living Home Assistive Devices/Equipment: Walker (specify type) ADL Screening (condition at time of admission) Patient's cognitive ability adequate to safely complete daily activities?: Yes Is the patient deaf or have difficulty hearing?: No Does the patient have difficulty seeing, even when wearing glasses/contacts?: No Does the patient have difficulty concentrating, remembering, or making decisions?: No Patient able to express need for assistance with ADLs?: Yes Does the patient have difficulty dressing or bathing?: Yes Independently performs ADLs?: Yes (appropriate for developmental age) Does the patient have difficulty walking or climbing stairs?: Yes Weakness of Legs: Both Weakness of Arms/Hands: Both  Permission Sought/Granted Permission sought to share information with : Family Supports, Chartered certified accountant granted to share information with : Yes, Verbal Permission Granted  Share Information with NAME: Christabelle Salameh  Permission granted to share info w AGENCY: SNFs  Permission granted to share info w Relationship: son  Permission granted to share info w Contact Information: [phone]  Emotional Assessment Appearance:: Appears stated age Attitude/Demeanor/Rapport: Engaged Affect (typically observed): Appropriate, Accepting, Quiet Orientation: : Oriented to Self, Oriented to Place, Oriented to Situation, Fluctuating Orientation (Suspected and/or reported Sundowners) Alcohol / Substance Use: Not Applicable Psych Involvement: No (comment)  Admission diagnosis:  Hypokalemia [E87.6] Acute urinary tract infection [N39.0] Vomiting in adult [R11.10] Patient Active  Problem List   Diagnosis Date Noted  . Loss  of weight   . Vomiting 01/02/2019  . Closed fracture of left superior pubic ramus, initial encounter 10/18/2018  . Dehydration 08/18/2018  . Dizziness 07/09/2018  . 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 Stroke-like symptom   . Dyspnea   . Acute blood loss anemia   . Acute ischemic stroke ([place]) 05/31/2018  . Stroke ([place]) 05/30/2018  . Chronic diastolic congestive heart failure ([place])   . Anemia of chronic disease   . Hypoalbuminemia due to protein-calorie malnutrition ([place])   . Labile blood pressure   . Atrial fibrillation ([place])   . Chronic neck pain   . Subcortical infarction ([place]) 04/27/2018  . CVA (cerebral vascular accident) ([place]) 04/26/2018  . TIA (transient ischemic attack) 04/24/2018  . CAD (coronary artery disease) 08/07/2016  . (HFpEF) heart failure with preserved ejection fraction ([place]) 08/07/2016  . Ischemic cardiomyopathy 08/07/2016  . Chest pain   . NSTEMI (non-ST elevated myocardial infarction) ([place])   . Status post coronary artery stent placement   . Abnormal stress test   . SOB (shortness of breath)   . Multifocal pneumonia 07/13/2016  . Elevated troponin 07/13/2016  . Leukocytosis   . Pacemaker 05/17/2014  . Complications, pacemaker cardiac, mechanical 12/31/2012  . Edema extremities 10/08/2012  . Persistent atrial fibrillation ([place]) 10/08/2012  . Hypertension 10/08/2012  . Hypothyroidism 10/08/2012  . Neuropathy 10/08/2012  . Diabetes ([place]) 10/08/2012  . Depression 09/08/2012  . Hypokalemia 08/08/2012  . Anemia 08/08/2012  . Knee fracture, left 08/07/2012  . Recurrent falls 08/07/2012  . Syncope 08/07/2012  . SDH (subdural hematoma) ([place]) 08/07/2012   PCP:  Sinclair Ship, MD Pharmacy:   [id number], [place] [id number] [place] [id number] Phone: [phone] Fax: [phone]  [id number] [place], [place] -  [address] [place] [address] Phone: [phone] Fax: [phone]     Social Determinants of Health (SDOH) Interventions    Readmission Risk Interventions Readmission Risk Prevention Plan 01/04/2019 08/19/2018  Transportation Screening Complete Complete  Medication Review Press photographer) Complete Complete  PCP or Specialist appointment within 3-5 days of discharge Not Complete Complete  PCP/Specialist Appt Not Complete comments plan for SNF -  HRI or [place] Not Complete Complete  HRI or Home Care Consult Pt Refusal Comments plan for SNF -  SW Recovery Care/Counseling Consult Complete Complete  Palliative Care Screening Not Applicable Not Applicable  Skilled Nursing Facility Complete Complete  Some recent data might be hidden

## 2019-01-04 NOTE — Progress Notes (Signed)
Pt up to chair with one assist.

## 2019-01-05 DIAGNOSIS — F0390 Unspecified dementia without behavioral disturbance: Principal | ICD-10-CM

## 2019-01-05 DIAGNOSIS — Z6820 Body mass index (BMI) 20.0-20.9, adult: Secondary | ICD-10-CM

## 2019-01-05 DIAGNOSIS — I5030 Unspecified diastolic (congestive) heart failure: Secondary | ICD-10-CM

## 2019-01-05 LAB — SARS CORONAVIRUS 2 BY RT PCR (HOSPITAL ORDER, PERFORMED IN ~~LOC~~ HOSPITAL LAB): SARS Coronavirus 2: NEGATIVE

## 2019-01-05 MED ORDER — SENNOSIDES-DOCUSATE SODIUM 8.6-50 MG PO TABS: 1.0000 | ORAL_TABLET | Freq: Every evening | ORAL | 0 refills | Status: AC | PRN

## 2019-01-05 MED ORDER — SERTRALINE HCL 100 MG PO TABS: 100.0000 mg | ORAL_TABLET | Freq: Every day | ORAL | 0 refills | Status: AC

## 2019-01-05 MED ORDER — FOLIC ACID 1 MG PO TABS: 1.0000 mg | ORAL_TABLET | Freq: Every day | ORAL | 0 refills | Status: AC

## 2019-01-05 NOTE — Progress Notes (Signed)
  Date: 01/05/2019  Patient name: Meredith Gonzalez  Medical record number: [id number]  Date of birth: 29-Apr-1935        I have seen and evaluated this patient and I have discussed the plan of care with the house staff. Please see their note for complete details. I concur with their findings with the following additions/corrections:   Patient was started on thiamine, folate, and multivitamin yesterday for malnutrition and macrocytic anemia.   Velna Ochs, MD 01/05/2019, 8:35 PM

## 2019-01-05 NOTE — Social Work (Signed)
Clinical Social Worker facilitated patient discharge including contacting patient family and facility to confirm patient discharge plans.  Clinical information faxed to facility and family agreeable with plan.  CSW arranged ambulance transport via [facility] to [facility]. [facility] for 3:30pm RN to call [phone]  with report prior to discharge.  Clinical Social Worker will sign off for now as social work intervention is no longer needed. Please consult [place] again if new need arises.  Westley Hummer, MSW, [place] Social Worker [phone]

## 2019-01-05 NOTE — TOC Transition Note (Addendum)
Transition of Care [facility]) - CM/SW Discharge Note   Patient Details  Name: Meredith Gonzalez MRN: [id number] Date of Birth: May 10, 1935  Transition of Care [facility]) CM/SW Contact:  Alexander Mt, [place] Phone Number: 01/05/2019, 11:58 AM   Clinical Narrative:    2:35pm- CSW has received dc summary, sent to [facility]; PT[facility] called for 3:30pm, DNR signed, no controlled substances noted on summary. RN aware and has # for report.   11:58am- CSW awaits dc orders/summary however per conversations pt is stable for dc today. [facility] aware and ready for pt. Pt son called and we discussed SNF discharge today. He is amenable. CSW also discussed PT evaluation today with pt son. He is aware that pt only accepting of doing some exercises- acknowledge that finances are a stressor and that in order for pt to obtain as many days as possible under her Medicare/supplement that she must continue to be amenable to therapy and work with them to the best of her ability to rehab. Pt son states understanding and will discuss this with pt as well.    11:58am Final next level of care: Skilled Nursing Facility Barriers to Discharge: SNF Pending discharge orders, SNF Pending discharge summary   2:34pm- Barriers to Discharge: Barriers Resolved   Patient Goals and CMS Choice   CMS Medicare.gov Compare Post Acute Care list provided to:: Patient Represenative (must comment)(pt son) Choice offered to / list presented to : Adult Children  Discharge Placement   Existing PASRR number confirmed : 01/04/19            Patient to be transferred to facility by: [place] Name of family member notified: pt son Lennette Bihari via telephone Patient and family notified of of transfer: 01/05/19  Discharge Plan and Services In-house Referral: Clinical Social Work Discharge Planning Services: CM Consult Post Acute Care Choice: [place]                               Social Determinants of Health (SDOH)  Interventions     Readmission Risk Interventions Readmission Risk Prevention Plan 01/04/2019 08/19/2018  Transportation Screening Complete Complete  Medication Review Press photographer) Complete Complete  PCP or Specialist appointment within 3-5 days of discharge Not Complete Complete  PCP/Specialist Appt Not Complete comments plan for SNF -  HRI or Home Care Consult Not Complete Complete  HRI or Home Care Consult Pt Refusal Comments plan for SNF -  SW Recovery Care/Counseling Consult Complete Complete  Palliative Care Screening Not Applicable Not Applicable  Skilled Nursing Facility Complete Complete  Some recent data might be hidden

## 2019-01-05 NOTE — Progress Notes (Signed)
[facility] Austell [facility]  Discussed with the patient and all questions fully answered.   VSS, Skin clean, dry and intact without evidence of skin break down, no evidence of skin tears noted. IV catheter discontinued. Site without signs and symptoms of complications. Dressing and pressure applied.   An After Visit Summary was printed and given to the patient.    D/C education completed with patient/family including follow up instructions, medication list, d/c activities limitations if indicated, with other d/c instructions as indicated by MD - patient able to verbalize understanding, all questions fully answered.    Patient instructed to return to ED, call 911, or call MD for any changes in condition.    Report Given to SNF.

## 2019-01-05 NOTE — TOC Progression Note (Signed)
Transition of Care [facility]) - Progression Note    Patient Details  Name: Yamilez Stanton MRN: [id number] Date of Birth: 04/13/1935  Transition of Care [facility]) CM/SW [place], [place] Phone Number: 01/05/2019, 11:02 AM  Clinical Narrative:    Per Elyse Hsu at [facility] pt able to dc under Medicare waiver; COVID neg on 10/10 is sufficient. Have messaged MD this morning and they are aware. Following for dc summary and orders, need any controlled substances signed and printed (even if continued home meds).    Expected Discharge Plan: Skilled Nursing Facility Barriers to Discharge: SNF Pending discharge orders, SNF Pending discharge summary  Expected Discharge Plan and Services Expected Discharge Plan: [place] In-house Referral: Clinical Social Work Discharge Planning Services: CM Consult Post Acute Care Choice: [place] Living arrangements for the past 2 months: Single Family Home   Social Determinants of Health (SDOH) Interventions    Readmission Risk Interventions Readmission Risk Prevention Plan 01/04/2019 08/19/2018  Transportation Screening Complete Complete  Medication Review Press photographer) Complete Complete  PCP or Specialist appointment within 3-5 days of discharge Not Complete Complete  PCP/Specialist Appt Not Complete comments plan for SNF -  HRI or Home Care Consult Not Complete Complete  HRI or Home Care Consult Pt Refusal Comments plan for SNF -  SW Recovery Care/Counseling Consult Complete Complete  Palliative Care Screening Not Applicable Not Applicable  Skilled Nursing Facility Complete Complete  Some recent data might be hidden

## 2019-01-05 NOTE — Progress Notes (Signed)
Physical Therapy Treatment Patient Details Name: Meredith Gonzalez MRN: [id number] DOB: [birth year] Today's Date: 01/05/2019    History of Present Illness Meredith Gonzalez is an 83 y.o female with atrial fibrillation, hx of recurrent strokes/TIA, CAD, HFpEF, hypothyroidism, HTN, MDD, DM2 who presented with 3 day history of nausea, vomiting, and difficulty getting anything down.    PT Comments    Patient received in recliner, agrees to exercises, but does not want to do more. Reports she had an awful night and is just exhausted. Patient demonstrates good tolerance and ability with B LE exercises. Patient will continue to benefit from skilled PT while here to improve strength and activity to increase independence.     Follow Up Recommendations  SNF;Supervision/Assistance - 24 hour     Equipment Recommendations  None recommended by PT    Recommendations for Other Services       Precautions / Restrictions Precautions Precautions: Fall Restrictions Weight Bearing Restrictions: No    Mobility  Bed Mobility                  Transfers                    Ambulation/Gait                 Stairs             Wheelchair Mobility    Modified Rankin (Stroke Patients Only)       Balance                                            Cognition Arousal/Alertness: Awake/alert Behavior During Therapy: Flat affect Overall Cognitive Status: Within Functional Limits for tasks assessed                                        Exercises Other Exercises Other Exercises: B LE strengthening:supine AP, heel slides, hip abd/add, seated marching LAQ x 10 reps bilaterally    General Comments        Pertinent Vitals/Pain Pain Assessment: No/denies pain    Home Living                      Prior Function            PT Goals (current goals can now be found in the care plan section) Acute Rehab PT Goals Patient Stated Goal:  pt doesnt report any goals PT Goal Formulation: With patient Time For Goal Achievement: 01/17/19 Progress towards PT goals: Not progressing toward goals - comment(decreased motivation)    Frequency    Min 3X/week      PT Plan Current plan remains appropriate    Co-evaluation              AM-PAC PT "6 Clicks" Mobility   Outcome Measure  Help needed turning from your back to your side while in a flat bed without using bedrails?: A Little Help needed moving from lying on your back to sitting on the side of a flat bed without using bedrails?: A Little Help needed moving to and from a bed to a chair (including a wheelchair)?: A Little Help needed standing up from a chair using your arms (e.g., wheelchair or bedside chair)?: A Little Help needed  to walk in hospital room?: A Lot Help needed climbing 3-5 steps with a railing? : A Lot 6 Click Score: 16    End of Session   Activity Tolerance: Patient limited by lethargy;Patient limited by fatigue Patient left: in chair;with call bell/phone within reach Nurse Communication: Mobility status PT Visit Diagnosis: Muscle weakness (generalized) (M62.81);Difficulty in walking, not elsewhere classified (R26.2);Adult, failure to thrive (R62.7);Unsteadiness on feet (R26.81)     Time: 1000-1010 PT Time Calculation (min) (ACUTE ONLY): 10 min  Charges:  $Therapeutic Exercise: 8-22 mins                     Meredith Gonzalez, PT, GCS 01/05/19,11:05 AM

## 2019-01-05 NOTE — Progress Notes (Signed)
NAME:  Meredith Gonzalez, MRN:  [id number], DOB:  [birth year], LOS: 2 ADMISSION DATE:  01/02/2019, Primary: Sinclair Ship, MD  CHIEF COMPLAINT: weakness   Medical Service: Internal Medicine Teaching Service         Attending Physician: Dr. Velna Ochs, MD    First Contact: Dr. Darrick Meigs Pager: [phone]  Second Contact: Dr. Sharon Seller Pager: [phone]       After Hours (After 5p/  First Contact Pager: [phone]  weekends / holidays): Second Contact Pager: [phone]    Brief History  83 yo female with PMH of a fib, numerous strokes, CAD, HFpEF, hypothyroidism, HTN, MDD, DM2 presenting with 1y hx of weight loss and loss of appetite. Subjective  No events overnight. [facility] about [facility] to [facility].  Objective   Blood pressure (!) 175/67, pulse 66, temperature 98.4 F (36.9 C), temperature source Oral, resp. rate 18, height 5\' 3"  (1.6 m), weight 51.4 kg, SpO2 96 %.     Intake/Output Summary (Last 24 hours) at 01/05/2019 0505 Last data filed at 01/04/2019 1600 Gross per 24 hour  Intake 1425 ml  Output 406 ml  Net 1019 ml   Filed Weights   01/02/19 1917 01/04/19 1700  Weight: 52.7 kg 51.4 kg    Examination: General: in no acute distress Heart: RRR Pulm: lung sounds clear Psych: flat affect  Consults:  none  Significant Diagnostic Tests:  10/10 CXR: unremarkable for acute cardiopulmonary process  10/10 abd xray: unremarkable  Micro Data:  10/10 urine culture>> no significant growth  Antimicrobials:  none  Labs    CBC Latest Ref Rng & Units 01/04/2019 01/03/2019 01/02/2019  WBC 4.0 - 10.5 K/uL 7.6 8.3 8.4  Hemoglobin 12.0 - 15.0 g/dL 11.0(L) 11.7(L) 12.2  Hematocrit 36.0 - 46.0 % 35.0(L) 37.3 39.0  Platelets 150 - 400 K/uL 236 245 241  B12 2600  Stable/resolved problems  Atrial fibrillation-continue amiodarone and eliquis Recurrent strokes/CAD-continue statin Hypothyroidism-continue synthroid  Summary  83 yo female with one year history of progressive  weight loss, weakness and anorexia. N/v x3d.  Assessment & Plan:  Active Problems:   Vomiting   Loss of weight   Failure to thrive in adult   MDD/failure to thrive. Anorexia x1 year. Increased zoloft to 100mg  daily on 10/11. Nursing notes patient ate 75% of lunch and part of a snack during day shift Plan Continue zoloft 100mg  Continue buspar Encourage po intake  Macrocytic anemia. hgb 11.7 on admission. MCV 105. Folate deficiency? Possibly from poor po intake? Consider starting folate supplement. Will defer to outpatient primary for this.  HTN. Blood pressures fairly elevated in the 170s-180s. Last blood pressure 139/61.  Doesn't appear to be on anything at home for this. Should follow up with PCP on discharge for medication optimization.  Deconditioning. Likely multifactorial including poor nutrition, MDD. PT/OT ordered.   Best practice:  CODE STATUS: DNR Diet: heart healthy Pain management: tylenol GI prophylaxis: protonix DVT for prophylaxis: on eliquis Family communication: spoke with pt's son last evening. Still has concerns regarding patient's weight loss and financial aspect. Working with SW regarding finanical aspect. Appears that majority of weight loss occurred 12/19. Relayed this to him and effects of depression on appetite. Son repeatedly states that he is upset about having spent significant amount of money on home renovations for her with continued decline in her health status. States he has been significantly financially affected by the ongoing COVID epidemic. I offered support and encouraged him to continue to discuss the insurance concerns with SW.  Dispo: SNF. [facility] offers placement. Likely d/c today   Mitzi Hansen, MD INTERNAL MEDICINE RESIDENT PGY-1 PAGER #: [phone] 01/05/19 5:05 AM

## 2019-01-05 NOTE — Discharge Summary (Signed)
Name: Meredith Gonzalez MRN: [id number] DOB: [birth year] 83 y.o. PCP: Sinclair Ship, MD  Date of Admission: 01/02/2019  5:01 PM Date of Discharge: 01/05/2019 Attending Physician: Velna Ochs, MD  Discharge Diagnosis: 1.  MDD and Dementia  2.  Macrocytic Anemia 3.  Hypothyroidism   Discharge Medications: Allergies as of 01/05/2019      Reactions   Valium [diazepam] Anxiety   Makes patient hyper   Robaxin [methocarbamol] Other (See Comments)   Makes pt loopy/acts drunk   Tramadol Other (See Comments)   sedation   Codeine Hives, Itching   Can take with Benadryl   Darifenacin Itching   Can take with Benadryl   Darvon [propoxyphene Hcl] Itching   Can take with Benadryl   Daypro [oxaprozin] Itching   Can take with Benadryl   Enablex [darifenacin Hydrobromide Er] Itching   Can take with Benadryl   Oxycodone Itching   Can take with Benadryl   Propoxyphene Itching   Can take with Benadryl   Risperdal [risperidone] Itching   Can take with Benadryl   Talwin [pentazocine] Itching   Can take with Benadryl   Vicodin [hydrocodone-acetaminophen] Itching   Can take with Benadryl      Medication List    STOP taking these medications   diphenhydrAMINE 25 MG tablet Commonly known as: BENADRYL   megestrol 20 MG tablet Commonly known as: MEGACE   ondansetron 4 MG disintegrating tablet Commonly known as: Zofran ODT   ondansetron 4 MG tablet Commonly known as: ZOFRAN   topiramate 25 MG tablet Commonly known as: TOPAMAX     TAKE these medications   acetaminophen 500 MG tablet Commonly known as: TYLENOL Take 1,000 mg by mouth 3 (three) times daily.   albuterol 108 (90 Base) MCG/ACT inhaler Commonly known as: VENTOLIN HFA Inhale 2 puffs into the lungs every 6 (six) hours as needed for wheezing or shortness of breath.   alum & mag hydroxide-simeth 200-200-20 MG/5ML suspension Commonly known as: MAALOX/MYLANTA Take 20 mLs by mouth every 6 (six) hours as needed for  indigestion or heartburn.   amiodarone 200 MG tablet Commonly known as: PACERONE Take 1 tablet (200 mg total) by mouth every morning.   apixaban 2.5 MG Tabs tablet Commonly known as: ELIQUIS Take 1 tablet (2.5 mg total) by mouth 2 (two) times daily.   atorvastatin 20 MG tablet Commonly known as: LIPITOR Take 1 tablet (20 mg total) by mouth daily for 30 days. What changed: how much to take   budesonide-formoterol 160-4.5 MCG/ACT inhaler Commonly known as: Symbicort Inhale 2 puffs into the lungs 2 (two) times daily. What changed: when to take this   busPIRone 7.5 MG tablet Commonly known as: BUSPAR Take 7.5 mg by mouth 2 (two) times daily.   calcitonin (salmon) 200 UNIT/ACT nasal spray Commonly known as: MIACALCIN/FORTICAL Place 1 spray into alternate nostrils daily.   cetaphil cream Apply 1 application topically 3 (three) times daily.   docusate sodium 100 MG capsule Commonly known as: COLACE Take 1 capsule (100 mg total) by mouth daily.   ferrous sulfate 325 (65 FE) MG tablet Take 1 tablet (325 mg total) by mouth 2 (two) times daily with a meal.   flintstones complete 60 MG chewable tablet Chew 1 tablet by mouth daily.   folic acid 1 MG tablet Commonly known as: FOLVITE Take 1 tablet (1 mg total) by mouth daily.   gabapentin 300 MG capsule Commonly known as: NEURONTIN Take 300 mg by mouth 2 (two) times daily. What  changed: Another medication with the same name was removed. Continue taking this medication, and follow the directions you see here.   hydrOXYzine 25 MG tablet Commonly known as: ATARAX/VISTARIL Take 25 mg by mouth every 6 (six) hours as needed. Stop date 11/12/2018   levothyroxine 75 MCG tablet Commonly known as: SYNTHROID Take 1 tablet (75 mcg total) by mouth daily at 6 (six) AM.   Melatonin 5 MG Tabs Take 5 mg by mouth at bedtime.   nitroGLYCERIN 0.4 MG SL tablet Commonly known as: NITROSTAT Place 1 tablet (0.4 mg total) under the tongue  every 5 (five) minutes as needed for chest pain.   pantoprazole 40 MG tablet Commonly known as: PROTONIX Take 1 tablet (40 mg total) by mouth daily. What changed: when to take this   senna-docusate 8.6-50 MG tablet Commonly known as: Senokot-S Take 1 tablet by mouth at bedtime as needed for mild constipation.   sertraline 100 MG tablet Commonly known as: ZOLOFT Take 1 tablet (100 mg total) by mouth daily. What changed:   medication strength  how much to take   vitamin B-12 1000 MCG tablet Commonly known as: CYANOCOBALAMIN Take 1,000 mcg by mouth daily.   Vitamin D 50 MCG (2000 UT) tablet Take 2,000 Units by mouth daily.       Disposition and follow-up:   Ms.Hisako Ronchetti was discharged from [facility] in Stable condition.  At the hospital follow up visit please address:  1.  MDD and Dementia: With associated anorexia, weight loss of >25lbs and deconditioning: Concern that the patient is no longer capable of being cared for at home due to her weakness. Needs rehabilitation and additional support due to her worsening dementia and depression. Macrocytic anemia: 11.7Hgb, (B-12 2,588). Please start folic acid therapy and consider additional workup with blood smear for her macrocytosis.   2.  Labs / imaging needed at time of follow-up: CBC, BMP  3.  Pending labs/ test needing follow-up: Methylmalonic acid  Follow-up Appointments: Contact information for after-discharge care    Destination    [facility] .   Service: Skilled Nursing Contact information: [address] [place] Hospital Course by problem list: 1.  MDD and Dementia: Patient presented with weight loss, deconditioning, and progressing dementia. Patients son having difficulty caring for at home.  She is in need 24/7 care at this time. Her initial presentation was initially for nausea and  vomiting which her son denied had occurred. He stated that her Zoloft was recently decreased he feel resulted in her being more withdrawn. She also began to consume lesser amounts of food per her son at around the same time. We increased her Zoloft, encouraged her to eat more to which she was able to consume nearly 75% of her meals. There were no overt signs of infection or alternative etiology to explain her symptomatology. She was deemed stable for discharge to a skilled nursing facility on 01/05/2019.   Discharge Vitals:   BP (!) 166/72 (BP Location: Left Arm)    Pulse 79    Temp 97.8 F (36.6 C) (Oral)    Resp 16    Ht 5\' 3"  (1.6 m)    Wt 51.4 kg    SpO2 97%    BMI 20.07 kg/m   Pertinent Labs, Studies, and Procedures:  CBC Latest Ref Rng & Units 01/04/2019 01/03/2019  01/02/2019  WBC 4.0 - 10.5 K/uL 7.6 8.3 8.4  Hemoglobin 12.0 - 15.0 g/dL 11.0(L) 11.7(L) 12.2  Hematocrit 36.0 - 46.0 % 35.0(L) 37.3 39.0  Platelets 150 - 400 K/uL 236 245 241   CMP Latest Ref Rng & Units 01/04/2019 01/03/2019 01/02/2019  Glucose 70 - 99 mg/dL 93 83 96  BUN 8 - 23 mg/dL 10 8 14   Creatinine 0.44 - 1.00 mg/dL 0.76 0.67 0.74  Sodium 135 - 145 mmol/L 140 139 138  Potassium 3.5 - 5.1 mmol/L 3.5 4.4 3.4(L)  Chloride 98 - 111 mmol/L 112(H) 107 107  CO2 22 - 32 mmol/L 22 23 22   Calcium 8.9 - 10.3 mg/dL 7.7(L) 7.8(L) 8.1(L)  Total Protein 6.5 - 8.1 g/dL - 5.7(L) 6.3(L)  Total Bilirubin 0.3 - 1.2 mg/dL - 0.8 0.8  Alkaline Phos 38 - 126 U/L - 86 87  AST 15 - 41 U/L - 111(H) 105(H)  ALT 0 - 44 U/L - 38 37     Discharge Instructions: Discharge Instructions    Diet - low sodium heart healthy   Complete by: As directed       Signed: Kathi Ludwig, MD 01/05/2019, 1:47 PM   Pager: # [phone]

## 2019-01-05 NOTE — Progress Notes (Signed)
MD on call paged bp bp 175/67 hr 66 patient is asymptomatic. Arthor Captain LPN

## 2019-01-05 NOTE — Plan of Care (Signed)
  Problem: Activity: Goal: Risk for activity intolerance will decrease Outcome: Progressing   Problem: Nutrition: Goal: Adequate nutrition will be maintained Outcome: Progressing   Problem: Elimination: Goal: Will not experience complications related to bowel motility Outcome: Progressing Goal: Will not experience complications related to urinary retention Outcome: Progressing   Problem: Safety: Goal: Ability to remain free from injury will improve Outcome: Progressing   Problem: Pain Managment: Goal: General experience of comfort will improve Outcome: Progressing   Problem: Skin Integrity: Goal: Risk for impaired skin integrity will decrease Outcome: Progressing   

## 2019-01-08 LAB — METHYLMALONIC ACID, SERUM: Methylmalonic Acid, Quantitative: 251 nmol/L (ref 0–378)

## 2019-01-26 ENCOUNTER — Telehealth: Payer: Self-pay | Admitting: Emergency Medicine

## 2019-01-26 NOTE — Telephone Encounter (Signed)
Patient is currently at a rehab facility and will be getting discharged next week. Son will take monitor to facility if patient is not discharged next week and provided the device clinic # to share with facility staff to set up monitor if needed.

## 2019-02-10 ENCOUNTER — Telehealth: Payer: Self-pay | Admitting: Adult Health

## 2019-02-10 NOTE — Telephone Encounter (Signed)
Noted  

## 2019-02-10 NOTE — Telephone Encounter (Signed)
fyi  I called patient regarding confirming 11/19 appointment. I spoke with pt's son who advises that pt went back into the hospital and is now in a rehab facility. He states she is not allowed to leave and go back to the facility. He also states her condition and where she will be heading next is still "up in the air" so patient will need to cancel this appt for now and possibly call back at a later time to reschedule.

## 2019-02-11 ENCOUNTER — Ambulatory Visit: Payer: Medicare Other | Admitting: Adult Health

## 2019-02-22 ENCOUNTER — Other Ambulatory Visit: Payer: Self-pay | Admitting: Internal Medicine

## 2019-02-22 DIAGNOSIS — R1031 Right lower quadrant pain: Secondary | ICD-10-CM

## 2019-02-25 ENCOUNTER — Ambulatory Visit (HOSPITAL_COMMUNITY)
Admission: RE | Admit: 2019-02-25 | Discharge: 2019-02-25 | Disposition: A | Discharge status: Home / Self Care | Payer: Medicare Other | Source: Ambulatory Visit | Attending: Internal Medicine | Admitting: Internal Medicine

## 2019-02-25 ENCOUNTER — Other Ambulatory Visit: Payer: Self-pay

## 2019-02-25 ENCOUNTER — Encounter (HOSPITAL_COMMUNITY): Payer: Self-pay

## 2019-02-25 DIAGNOSIS — R1031 Right lower quadrant pain: Secondary | ICD-10-CM

## 2019-02-25 MED ORDER — SODIUM CHLORIDE (PF) 0.9 % IJ SOLN
INTRAMUSCULAR | Status: AC
  Filled 2019-02-25: qty 50

## 2019-02-25 MED ORDER — IOHEXOL 300 MG/ML  SOLN
100.0000 mL | Freq: Once | INTRAMUSCULAR | Status: AC | PRN
  Administered 2019-02-25: 100 mL via INTRAVENOUS

## 2019-03-18 ENCOUNTER — Ambulatory Visit (INDEPENDENT_AMBULATORY_CARE_PROVIDER_SITE_OTHER): Payer: Medicare Other | Admitting: *Deleted

## 2019-03-18 DIAGNOSIS — Z950 Presence of cardiac pacemaker: Secondary | ICD-10-CM

## 2019-03-19 LAB — CUP PACEART REMOTE DEVICE CHECK
Battery Remaining Percentage: 40 %
Brady Statistic RA Percent Paced: 70 %
Brady Statistic RV Percent Paced: 1 %
Date Time Interrogation Session: 20201223192804
Implantable Lead Implant Date: 20110523
Implantable Lead Implant Date: 20141010
Implantable Lead Location: 753859
Implantable Lead Location: 753860
Implantable Lead Model: 346
Implantable Lead Model: 350
Implantable Lead Serial Number: 28756637
Implantable Lead Serial Number: 29491855
Implantable Pulse Generator Implant Date: 20110523
Lead Channel Impedance Value: 644 Ohm
Lead Channel Impedance Value: 644 Ohm
Lead Channel Pacing Threshold Amplitude: 0.7 V
Lead Channel Pacing Threshold Amplitude: 0.8 V
Lead Channel Pacing Threshold Pulse Width: 0.4 ms
Lead Channel Pacing Threshold Pulse Width: 0.4 ms
Lead Channel Sensing Intrinsic Amplitude: 0.4 mV
Lead Channel Sensing Intrinsic Amplitude: 15.1 mV
Lead Channel Setting Pacing Amplitude: 1.6 V
Lead Channel Setting Pacing Amplitude: 1.7 V
Lead Channel Setting Pacing Pulse Width: 0.4 ms
Pulse Gen Serial Number: 66080052

## 2019-04-01 ENCOUNTER — Telehealth: Payer: Self-pay | Admitting: Pulmonary Disease

## 2019-04-01 NOTE — Telephone Encounter (Signed)
Spoke with son.  Patient going to keep telephone visit for 04/02/19. Please call [phone]  She will have a nurse in room with her for the appt as patient lives at an assisted living facility.

## 2019-04-02 ENCOUNTER — Other Ambulatory Visit: Payer: Self-pay

## 2019-04-02 ENCOUNTER — Ambulatory Visit (INDEPENDENT_AMBULATORY_CARE_PROVIDER_SITE_OTHER): Payer: Medicare Other | Admitting: Pulmonary Disease

## 2019-04-02 ENCOUNTER — Encounter: Payer: Self-pay | Admitting: Pulmonary Disease

## 2019-04-02 DIAGNOSIS — Z79899 Other long term (current) drug therapy: Secondary | ICD-10-CM

## 2019-04-02 DIAGNOSIS — J849 Interstitial pulmonary disease, unspecified: Secondary | ICD-10-CM | POA: Diagnosis not present

## 2019-04-02 NOTE — Addendum Note (Signed)
Addended by: Amado Coe on: 04/02/2019 11:44 AM   Modules accepted: Orders

## 2019-04-02 NOTE — Patient Instructions (Addendum)
I am glad your breathing is stable We will order high-resolution CT in 4-6 months Follow-up in clinic after CT to discuss next steps.

## 2019-04-02 NOTE — Progress Notes (Signed)
Virtual Visit via Telephone Note  I connected with Meredith Gonzalez on 04/02/19 at 10:00 AM EST by telephone and verified that I am speaking with the correct person using two identifiers.  Location: Patient: Home Provider: [facility], [place], [place]   I discussed the limitations, risks, security and privacy concerns of performing an evaluation and management service by telephone and the availability of in person appointments. I also discussed with the patient that there may be a patient responsible charge related to this service. The patient expressed understanding and agreed to proceed.   History of Present Illness:  Chief complaint: Follow up for abnormal PFTs, Evaluate for ILD   HPI: 84 year old with history of persistent atrial fibrillation on amiodarone, diastolic heart failure, hypothyroidism, CVA coronary artery disease.   She had PFTs ordered from cardiology for amiodarone monitoring which showed severe restriction and severe diffusion defect.    She was initially evaluated in December 2020 for possible interstitial lung disease.  High-resolution CT ordered at that time but not completed yet.  Observations/Objective: Since that office visit she has had several admissions for CVA, progressive dementia, fall, atrial fibrillation   She is currently residing in a nursing home.  States that her breathing is doing well with no issues.  She continues on Symbicort but cannot tell if it is helping her Does not require supplemental oxygen She has received her first dose of COVID vaccine yesterday and has tolerated it well.   CT abdomen pelvis 02/25/2019-visualized lung bases show mild reticulation with patchy groundglass opacities suggestive of interstitial lung disease.  I have reviewed the images personally.  Assessment and Plan: Interstitial lung disease Reviewed her recent CT abdomen pelvis which does show chronic interstitial changes at the lung bases.  She  likely has baseline interstitial lung disease which will need full evaluation with a high-resolution CT.  I had a discussion with her son over the telephone and discussed plan.  We will get a high-res CT around spring to summer 2021 after the Covid pandemic is under control and reevaluate in clinic.  Given her frailty, decline in functional status and other medical issues I feel we should not be aggressive with work-up or treatment, especially as her respiratory status is stable We will discuss further after completion of CT  Follow Up Instructions: High-resolution CT in 4-6 months Follow-up in clinic after CT   I discussed the assessment and treatment plan with the patient. The patient was provided an opportunity to ask questions and all were answered. The patient agreed with the plan and demonstrated an understanding of the instructions.   The patient was advised to call back or seek an in-person evaluation if the symptoms worsen or if the condition fails to improve as anticipated.  I provided 25 minutes of non-face-to-face time during this encounter.  Marshell Garfinkel MD [facility] 04/02/2019, 9:42 AM

## 2019-04-24 ENCOUNTER — Other Ambulatory Visit: Payer: Self-pay | Admitting: Gastroenterology

## 2019-04-24 DIAGNOSIS — R9389 Abnormal findings on diagnostic imaging of other specified body structures: Secondary | ICD-10-CM

## 2019-04-24 DIAGNOSIS — R109 Unspecified abdominal pain: Secondary | ICD-10-CM

## 2019-05-14 ENCOUNTER — Inpatient Hospital Stay: Admission: RE | Admit: 2019-05-14 | Payer: Medicare Other | Source: Ambulatory Visit

## 2019-05-18 IMAGING — CT CT ANGIOGRAPHY NECK
1 of 8 series · 6 of 33 positions shown · IV contrast (APPLIED)
Comparison: CT head without contrast 07/09/2018

CLINICAL DATA: Stroke follow-up

EXAM:
CT ANGIOGRAPHY HEAD AND NECK
TECHNIQUE: Multidetector CT imaging of the head and neck was performed using
the standard protocol during bolus administration of intravenous
contrast. Multiplanar CT image reconstructions and MIPs were
obtained to evaluate the vascular anatomy. Carotid stenosis
measurements (when applicable) are obtained utilizing NASCET
criteria, using the distal internal carotid diameter as the
denominator.
CONTRAST:  100mL OMNIPAQUE IOHEXOL 350 MG/ML SOLN

[Series 8: ax thins · axial · 0.39mm/px · z∈[-230,+9]mm · 6 of 335 slices shown]
[im 48/335  soft-tissue]
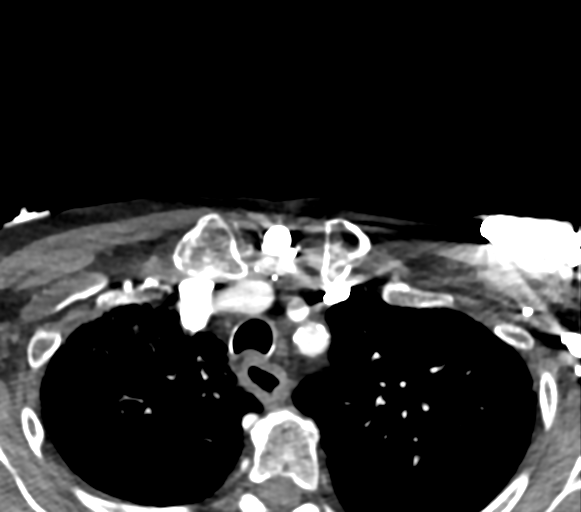
[im 96/335  bone]
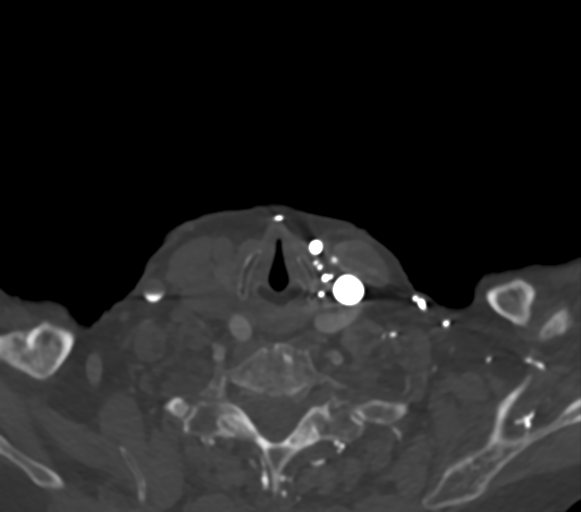
[im 144/335  soft-tissue]
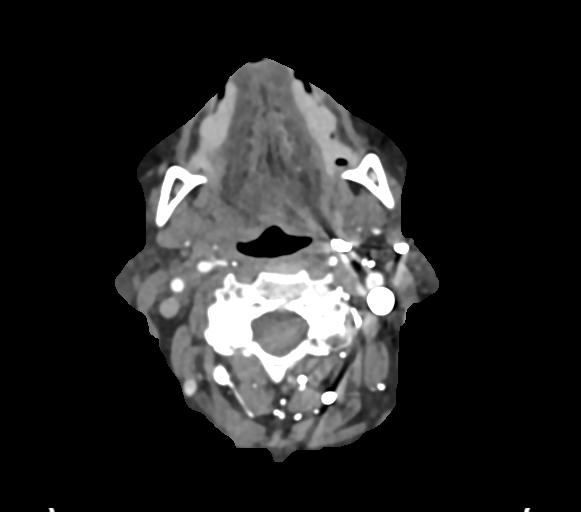
[im 191/335  bone]
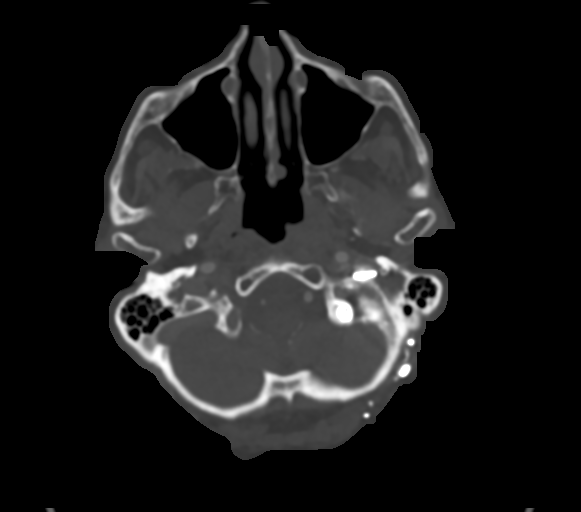
[im 239/335  soft-tissue]
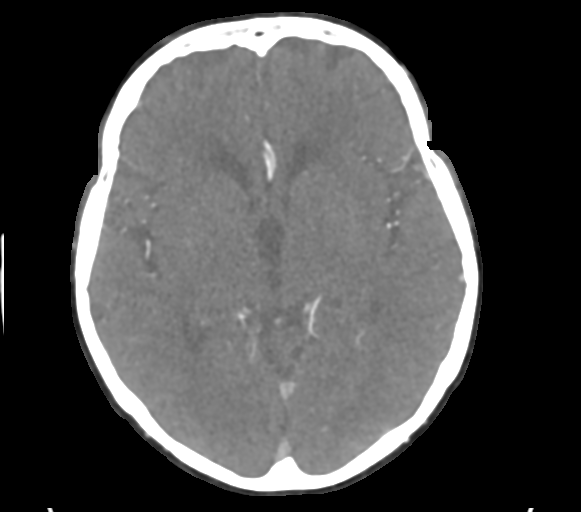
[im 287/335  bone]
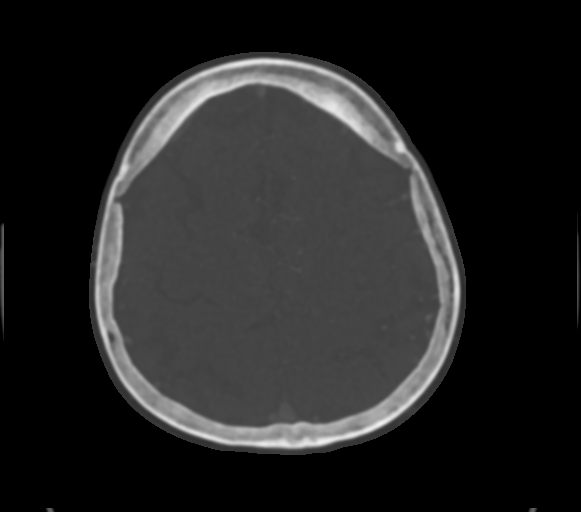

[6 of 33 positions shown; findings below may reference images not displayed]

FINDINGS: CTA NECK FINDINGS

Aortic arch: A 3 vessel arch configuration is present.
Atherosclerotic changes are present at the aorta and origins of the
great vessels without significant aneurysm or stenosis.

Right carotid system: The right common carotid artery is within
normal limits. Right carotid endarterectomy is noted. There is no
residual recurrent stenosis. Distal cervical right ICA is normal.

Left carotid system: The left common carotid artery is tortuous
without significant stenosis. Atherosclerotic changes are present at
the left carotid bifurcation. There is no significant stenosis
relative to the more distal vessel. The distal left ICA is normal.

Vertebral arteries: The left vertebral artery is the dominant
vessel. Vertebral arteries originate from the subclavian arteries
bilaterally without significant stenosis. There is no significant
stenosis or vascular injury to either vertebral artery in the neck.

Skeleton: A chronic type 2 dens fracture is stable. Grade 1
degenerative anterolisthesis at C3-4 and C4-5 is stable. There is
stable fusion at C5-6 and C6-7. No focal lytic or blastic lesions
are present. The patient is edentulous. Dental implant post are
noted in the mandible.

Other neck: The soft tissues the neck are otherwise unremarkable.

Upper chest: Patchy ground-glass attenuation is present throughout
both lungs.

Review of the MIP images confirms the above findings

CTA HEAD FINDINGS

Anterior circulation: Atherosclerotic calcifications are present
within the cavernous internal carotid arteries bilaterally. There is
no significant stenosis through the ICA termini. The A1 and M1
segments are normal. MCA bifurcations are intact. There is some
irregularity of distal ACA and MCA branch vessels without a
significant proximal stenosis or occlusion.

Posterior circulation: The left vertebral artery is the dominant
vessel. PICA origins are visualized and normal. Vertebrobasilar
junction is normal. Basilar artery is within normal limits. Both
posterior cerebral arteries originate from the basilar tip. The PCA
branch vessels are within normal limits.

Venous sinuses: Dural sinuses are patent. Straight sinus and deep
cerebral veins are patent. Cortical veins are unremarkable.

Anatomic variants: None

Delayed phase: No pathologic enhancement is present. Moderate white
matter disease is noted.

Review of the MIP images confirms the above findings
IMPRESSION: 1. Right carotid endarterectomy is patent.
2. The large vessel occlusion.
3. Mild diffuse distal small vessel disease.
4. Atherosclerotic changes at the left carotid bifurcation without a
significant stenosis.
5. Atherosclerotic changes at the aortic arch and cavernous internal
carotid arteries bilaterally without a significant stenosis.
6. Chronic type 2 dens fracture.
7. Degenerative changes of the cervical spine are stable.

## 2019-06-07 ENCOUNTER — Ambulatory Visit
Admission: RE | Admit: 2019-06-07 | Discharge: 2019-06-07 | Disposition: A | Discharge status: Home / Self Care | Payer: Medicare Other | Source: Ambulatory Visit | Attending: Gastroenterology | Admitting: Gastroenterology

## 2019-06-07 DIAGNOSIS — R9389 Abnormal findings on diagnostic imaging of other specified body structures: Secondary | ICD-10-CM

## 2019-06-07 DIAGNOSIS — R109 Unspecified abdominal pain: Secondary | ICD-10-CM

## 2019-07-29 ENCOUNTER — Ambulatory Visit
Admission: RE | Admit: 2019-07-29 | Discharge: 2019-07-29 | Disposition: A | Discharge status: Home / Self Care | Payer: Medicare Other | Source: Ambulatory Visit | Attending: Pulmonary Disease | Admitting: Pulmonary Disease

## 2019-07-29 DIAGNOSIS — J849 Interstitial pulmonary disease, unspecified: Secondary | ICD-10-CM

## 2019-08-02 ENCOUNTER — Other Ambulatory Visit: Payer: Self-pay

## 2019-08-02 ENCOUNTER — Ambulatory Visit (INDEPENDENT_AMBULATORY_CARE_PROVIDER_SITE_OTHER): Payer: Medicare Other | Admitting: Pulmonary Disease

## 2019-08-02 ENCOUNTER — Encounter: Payer: Self-pay | Admitting: Pulmonary Disease

## 2019-08-02 ENCOUNTER — Ambulatory Visit: Payer: Medicare Other | Admitting: Pulmonary Disease

## 2019-08-02 VITALS — BP 154/70 | HR 103 | Ht 63.0 in | Wt 110.4 lb

## 2019-08-02 DIAGNOSIS — R0602 Shortness of breath: Secondary | ICD-10-CM

## 2019-08-02 DIAGNOSIS — J849 Interstitial pulmonary disease, unspecified: Secondary | ICD-10-CM

## 2019-08-02 NOTE — Progress Notes (Signed)
Meredith Gonzalez    [id number]    [birth year]  Primary Care Physician:Smith, Cecille Rubin, MD  Referring Physician: Sinclair Ship, [place] [id number] [place],  [place] [address]  Chief complaint: Follow up for ILD  HPI: 84 year old with history of persistent atrial fibrillation on amiodarone, diastolic heart failure, hypothyroidism, CVA coronary artery disease.  Sent here for evaluation of abnormal PFTs  She had PFTs ordered from cardiology for amiodarone monitoring which showed severe restriction and severe diffusion defect.  The question is if amiodarone needs to be stopped She has symptoms of dyspnea on exertion.  Mild symptoms at rest.  Occasional wheezing, cough with white mucus Denies fevers, chills   Pets: No pets Occupation: Retired Chief Technology Officer Exposures: Exposures, mold, hot tub, Jacuzzi, humidifier Smoking history: In the past she reports 4-pack-year smoker, today she reports 30 year smoking history.  Quit in 1974.  Her son who is here for office visit tells me that she is minimizing her smoking history and has smoked much more than what she admits to.  Travel history: Previously lived in [place] and [place].  No significant recent travel Relevant family history: No significant family history of lung disease  Interim History: Patient is mostly wheelchair bound secondary to functional decline and balance issues.  Denies cough and dyspnea . Still on amiodarone    Outpatient Encounter Medications as of 08/02/2019  Medication Sig  . acetaminophen (TYLENOL) 500 MG tablet Take 1,000 mg by mouth 3 (three) times daily.   Marland Kitchen albuterol (PROVENTIL HFA;VENTOLIN HFA) 108 (90 Base) MCG/ACT inhaler Inhale 2 puffs into the lungs every 6 (six) hours as needed for wheezing or shortness of breath.  Marland Kitchen alum & mag hydroxide-simeth (MAALOX/MYLANTA) 200-200-20 MG/5ML suspension Take 20 mLs by mouth every 6 (six) hours as needed for indigestion or heartburn.  Marland Kitchen amiodarone  (PACERONE) 200 MG tablet Take 1 tablet (200 mg total) by mouth every morning.  Marland Kitchen apixaban (ELIQUIS) 2.5 MG TABS tablet Take 1 tablet (2.5 mg total) by mouth 2 (two) times daily.  Marland Kitchen atorvastatin (LIPITOR) 20 MG tablet Take 1 tablet (20 mg total) by mouth daily for 30 days. (Patient taking differently: Take 40 mg by mouth daily. )  . budesonide-formoterol (SYMBICORT) 160-4.5 MCG/ACT inhaler Inhale 2 puffs into the lungs 2 (two) times daily. (Patient taking differently: Inhale 2 puffs into the lungs daily. )  . busPIRone (BUSPAR) 7.5 MG tablet Take 7.5 mg by mouth 2 (two) times daily.  . calcitonin, salmon, (MIACALCIN/FORTICAL) 200 UNIT/ACT nasal spray Place 1 spray into alternate nostrils daily.   . cetaphil (CETAPHIL) cream Apply 1 application topically 3 (three) times daily.  . Cholecalciferol (VITAMIN D) 50 MCG (2000 UT) tablet Take 2,000 Units by mouth daily.  Marland Kitchen docusate sodium (COLACE) 100 MG capsule Take 1 capsule (100 mg total) by mouth daily.  . flintstones complete (FLINTSTONES) 60 MG chewable tablet Chew 1 tablet by mouth daily.  Marland Kitchen levothyroxine (SYNTHROID, LEVOTHROID) 75 MCG tablet Take 1 tablet (75 mcg total) by mouth daily at 6 (six) AM.  . pantoprazole (PROTONIX) 40 MG tablet Take 1 tablet (40 mg total) by mouth daily. (Patient taking differently: Take 40 mg by mouth 2 (two) times daily. )  . senna-docusate (SENOKOT-S) 8.6-50 MG tablet Take 1 tablet by mouth at bedtime as needed for mild constipation.  . ferrous sulfate 325 (65 FE) MG tablet Take 1 tablet (325 mg total) by mouth 2 (two) times daily with a meal. (  Patient not taking: Reported on 08/02/2019)  . folic acid (FOLVITE) 1 MG tablet Take 1 tablet (1 mg total) by mouth daily. (Patient not taking: Reported on 08/02/2019)  . gabapentin (NEURONTIN) 300 MG capsule Take 300 mg by mouth 2 (two) times daily.  . hydrOXYzine (ATARAX/VISTARIL) 25 MG tablet Take 25 mg by mouth every 6 (six) hours as needed. Stop date 11/12/2018  . Melatonin 5  MG TABS Take 5 mg by mouth at bedtime.  . nitroGLYCERIN (NITROSTAT) 0.4 MG SL tablet Place 1 tablet (0.4 mg total) under the tongue every 5 (five) minutes as needed for chest pain. (Patient not taking: Reported on 08/02/2019)  . sertraline (ZOLOFT) 100 MG tablet Take 1 tablet (100 mg total) by mouth daily. (Patient not taking: Reported on 08/02/2019)  . vitamin B-12 (CYANOCOBALAMIN) 1000 MCG tablet Take 1,000 mcg by mouth daily.   No facility-administered encounter medications on file as of 08/02/2019.   Physical Exam: Blood pressure (!) 148/76, pulse 74, height 5\' 5"  (1.651 m), weight 132 lb 6.4 oz (60.1 kg), SpO2 97 %.  General: NAD, nl appearance HE: Normocephalic, atraumatic , EOMI, Conjunctivae normal ENT: No congestion, no rhinorrhea, no exudate or erythema  Cardiovascular: Normal rate, regular rhythm.  No murmurs, rubs, or gallops Pulmonary : Effort normal, rales, no rhonchi or wheezes  Abdominal: soft, nontender,  bowel sounds present Musculoskeletal: no swelling , right ankle wrapped in bandages Skin: Warm, dry , multiple excoriations  Psychiatric/Behavioral:  normal mood, normal behavior    Data Reviewed: Imaging: Chest x-ray 02/11/2018- cardiomegaly, chronic interstitial prominence.  High Resolution CT Chest  5/062021 : Alternate pattern. Peribronchial and subpleural ground glass opacity . Traction bronchiectasis. No honeycombing.   PFTs: 04/16/2018 FVC 1.08 [39%], FEV1 0.95 [46%], F/F 88, TLC 3.33 [64%], DLCO 13.22 [51%] Severe restriction with moderate-severe diffusion defect. Test limited by patient's cough.  Labs: CBC 05/04/2018-WBC 5.6, eos 3%, absolute eosinophil count 168  Assessment:  ILD with Alternate pattern There is restriction and moderate-severe diffusion defect on previous PFT's.No obvious exposure apparent after patient interview. Patient's son will fill out ILD questionnaire with patient and bring for review at next visit. Her other health conditions limit  her activity and she is not symptomatic at rest. Review high resolution CT Chest and consistent with alternate pattern. Will further work up with hypersensitive pneumonitis panel, RF, CCP, ANA, and have patient follow up in 3 months.    Although the test is noted by patient's cough. Order high-resolution CT for evaluation of interstitial lung disease and possible amiodarone toxicity   COPD No dyspnea or wheezing on exam, improved with Symbicort. Likely underlying COPD, emphysema which is not captured by PFTs  Plan/Recommendations: - hypersensitive pneumonitis panel - RF -CCP - ANA - continue Symbicort - Follow up in 3 months  Tamsen Snider, MD PGY1   Attending note: I have seen and examined the patient. History, labs and imaging reviewed. Agree with assessment and plan Ongoing evaluation for interstitial lung disease High-resolution CT reviewed with findings suggestive of hypersensitivity pneumonitis though she does not have any known exposures.  This does not look like amiodarone toxicity We will check some basic labs including hypersensitivity panel, CTD profile She has been given an ILD questionnaire for further evaluation  Discussed with son and patient Given her age, frailty and absence of dyspnea I would not recommend an invasive procedure like bronchoscope or biopsy. If the above work-up identifies an etiology then we can treat or get rid of the offending allergen  otherwise monitor closely.  Marshell Garfinkel MD [facility] 08/02/2019, 4:22 PM   CC: Sinclair Ship, MD

## 2019-08-02 NOTE — Patient Instructions (Addendum)
I have reviewed his CT scan which shows changes in the lung called interstitial lung disease which can come from exposures We will check some basic blood test today to further work this up and give you a questionnaire to further investigate exposures  Based on the fact that your breathing is okay I would not recommend any invasive procedures at present We will continue to keep a close watch on this  Follow-up in 6 months with spirometry and diffusion capacity.

## 2019-08-24 ENCOUNTER — Other Ambulatory Visit: Payer: Self-pay

## 2019-08-24 ENCOUNTER — Encounter (HOSPITAL_COMMUNITY): Payer: Self-pay

## 2019-08-24 ENCOUNTER — Emergency Department (HOSPITAL_COMMUNITY): Payer: Medicare Other

## 2019-08-24 ENCOUNTER — Inpatient Hospital Stay (HOSPITAL_COMMUNITY)
Admission: EM | Admit: 2019-08-24 | Discharge: 2019-09-23 | DRG: 196 | Disposition: E | Payer: Medicare Other | Attending: Internal Medicine | Admitting: Internal Medicine

## 2019-08-24 DIAGNOSIS — J42 Unspecified chronic bronchitis: Secondary | ICD-10-CM

## 2019-08-24 DIAGNOSIS — R079 Chest pain, unspecified: Secondary | ICD-10-CM | POA: Diagnosis present

## 2019-08-24 DIAGNOSIS — Z7951 Long term (current) use of inhaled steroids: Secondary | ICD-10-CM

## 2019-08-24 DIAGNOSIS — Z9884 Bariatric surgery status: Secondary | ICD-10-CM

## 2019-08-24 DIAGNOSIS — Z955 Presence of coronary angioplasty implant and graft: Secondary | ICD-10-CM

## 2019-08-24 DIAGNOSIS — E43 Unspecified severe protein-calorie malnutrition: Secondary | ICD-10-CM | POA: Diagnosis present

## 2019-08-24 DIAGNOSIS — D638 Anemia in other chronic diseases classified elsewhere: Secondary | ICD-10-CM | POA: Diagnosis present

## 2019-08-24 DIAGNOSIS — I509 Heart failure, unspecified: Secondary | ICD-10-CM

## 2019-08-24 DIAGNOSIS — Z9842 Cataract extraction status, left eye: Secondary | ICD-10-CM

## 2019-08-24 DIAGNOSIS — Z950 Presence of cardiac pacemaker: Secondary | ICD-10-CM | POA: Diagnosis present

## 2019-08-24 DIAGNOSIS — Z7901 Long term (current) use of anticoagulants: Secondary | ICD-10-CM

## 2019-08-24 DIAGNOSIS — Z888 Allergy status to other drugs, medicaments and biological substances status: Secondary | ICD-10-CM

## 2019-08-24 DIAGNOSIS — R296 Repeated falls: Secondary | ICD-10-CM | POA: Diagnosis present

## 2019-08-24 DIAGNOSIS — R338 Other retention of urine: Secondary | ICD-10-CM

## 2019-08-24 DIAGNOSIS — Z9049 Acquired absence of other specified parts of digestive tract: Secondary | ICD-10-CM

## 2019-08-24 DIAGNOSIS — Z87891 Personal history of nicotine dependence: Secondary | ICD-10-CM

## 2019-08-24 DIAGNOSIS — E8809 Other disorders of plasma-protein metabolism, not elsewhere classified: Secondary | ICD-10-CM | POA: Diagnosis present

## 2019-08-24 DIAGNOSIS — Z8249 Family history of ischemic heart disease and other diseases of the circulatory system: Secondary | ICD-10-CM

## 2019-08-24 DIAGNOSIS — Z9841 Cataract extraction status, right eye: Secondary | ICD-10-CM

## 2019-08-24 DIAGNOSIS — I110 Hypertensive heart disease with heart failure: Secondary | ICD-10-CM | POA: Diagnosis present

## 2019-08-24 DIAGNOSIS — R1310 Dysphagia, unspecified: Secondary | ICD-10-CM

## 2019-08-24 DIAGNOSIS — R64 Cachexia: Secondary | ICD-10-CM | POA: Diagnosis present

## 2019-08-24 DIAGNOSIS — J8410 Pulmonary fibrosis, unspecified: Secondary | ICD-10-CM | POA: Diagnosis not present

## 2019-08-24 DIAGNOSIS — Z20822 Contact with and (suspected) exposure to covid-19: Secondary | ICD-10-CM | POA: Diagnosis present

## 2019-08-24 DIAGNOSIS — F32A Depression, unspecified: Secondary | ICD-10-CM | POA: Diagnosis present

## 2019-08-24 DIAGNOSIS — Z66 Do not resuscitate: Secondary | ICD-10-CM

## 2019-08-24 DIAGNOSIS — E46 Unspecified protein-calorie malnutrition: Secondary | ICD-10-CM | POA: Diagnosis present

## 2019-08-24 DIAGNOSIS — E1142 Type 2 diabetes mellitus with diabetic polyneuropathy: Secondary | ICD-10-CM | POA: Diagnosis present

## 2019-08-24 DIAGNOSIS — I252 Old myocardial infarction: Secondary | ICD-10-CM

## 2019-08-24 DIAGNOSIS — J9621 Acute and chronic respiratory failure with hypoxia: Secondary | ICD-10-CM | POA: Diagnosis present

## 2019-08-24 DIAGNOSIS — Z961 Presence of intraocular lens: Secondary | ICD-10-CM | POA: Diagnosis present

## 2019-08-24 DIAGNOSIS — J189 Pneumonia, unspecified organism: Secondary | ICD-10-CM

## 2019-08-24 DIAGNOSIS — Z885 Allergy status to narcotic agent status: Secondary | ICD-10-CM

## 2019-08-24 DIAGNOSIS — I5032 Chronic diastolic (congestive) heart failure: Secondary | ICD-10-CM | POA: Diagnosis present

## 2019-08-24 DIAGNOSIS — R627 Adult failure to thrive: Secondary | ICD-10-CM | POA: Diagnosis present

## 2019-08-24 DIAGNOSIS — R0989 Other specified symptoms and signs involving the circulatory and respiratory systems: Secondary | ICD-10-CM | POA: Diagnosis present

## 2019-08-24 DIAGNOSIS — R54 Age-related physical debility: Secondary | ICD-10-CM | POA: Diagnosis present

## 2019-08-24 DIAGNOSIS — J9601 Acute respiratory failure with hypoxia: Secondary | ICD-10-CM

## 2019-08-24 DIAGNOSIS — I255 Ischemic cardiomyopathy: Secondary | ICD-10-CM | POA: Diagnosis present

## 2019-08-24 DIAGNOSIS — Z85828 Personal history of other malignant neoplasm of skin: Secondary | ICD-10-CM

## 2019-08-24 DIAGNOSIS — J849 Interstitial pulmonary disease, unspecified: Secondary | ICD-10-CM

## 2019-08-24 DIAGNOSIS — R0600 Dyspnea, unspecified: Secondary | ICD-10-CM | POA: Diagnosis present

## 2019-08-24 DIAGNOSIS — Z79899 Other long term (current) drug therapy: Secondary | ICD-10-CM

## 2019-08-24 DIAGNOSIS — R9439 Abnormal result of other cardiovascular function study: Secondary | ICD-10-CM | POA: Diagnosis present

## 2019-08-24 DIAGNOSIS — R609 Edema, unspecified: Secondary | ICD-10-CM | POA: Diagnosis present

## 2019-08-24 DIAGNOSIS — J9600 Acute respiratory failure, unspecified whether with hypoxia or hypercapnia: Secondary | ICD-10-CM | POA: Diagnosis not present

## 2019-08-24 DIAGNOSIS — I2510 Atherosclerotic heart disease of native coronary artery without angina pectoris: Secondary | ICD-10-CM | POA: Diagnosis present

## 2019-08-24 DIAGNOSIS — E876 Hypokalemia: Secondary | ICD-10-CM | POA: Diagnosis not present

## 2019-08-24 DIAGNOSIS — F411 Generalized anxiety disorder: Secondary | ICD-10-CM

## 2019-08-24 DIAGNOSIS — R339 Retention of urine, unspecified: Secondary | ICD-10-CM | POA: Diagnosis not present

## 2019-08-24 DIAGNOSIS — I639 Cerebral infarction, unspecified: Secondary | ICD-10-CM | POA: Diagnosis present

## 2019-08-24 DIAGNOSIS — E039 Hypothyroidism, unspecified: Secondary | ICD-10-CM | POA: Diagnosis present

## 2019-08-24 DIAGNOSIS — Z8673 Personal history of transient ischemic attack (TIA), and cerebral infarction without residual deficits: Secondary | ICD-10-CM

## 2019-08-24 DIAGNOSIS — I452 Bifascicular block: Secondary | ICD-10-CM | POA: Diagnosis present

## 2019-08-24 DIAGNOSIS — F329 Major depressive disorder, single episode, unspecified: Secondary | ICD-10-CM | POA: Diagnosis present

## 2019-08-24 DIAGNOSIS — I4819 Other persistent atrial fibrillation: Secondary | ICD-10-CM | POA: Diagnosis present

## 2019-08-24 DIAGNOSIS — Z681 Body mass index (BMI) 19 or less, adult: Secondary | ICD-10-CM

## 2019-08-24 DIAGNOSIS — J440 Chronic obstructive pulmonary disease with acute lower respiratory infection: Secondary | ICD-10-CM | POA: Diagnosis present

## 2019-08-24 DIAGNOSIS — M797 Fibromyalgia: Secondary | ICD-10-CM | POA: Diagnosis present

## 2019-08-24 DIAGNOSIS — Z515 Encounter for palliative care: Secondary | ICD-10-CM

## 2019-08-24 DIAGNOSIS — Z7189 Other specified counseling: Secondary | ICD-10-CM

## 2019-08-24 DIAGNOSIS — E119 Type 2 diabetes mellitus without complications: Secondary | ICD-10-CM

## 2019-08-24 DIAGNOSIS — Y95 Nosocomial condition: Secondary | ICD-10-CM | POA: Diagnosis present

## 2019-08-24 DIAGNOSIS — I4891 Unspecified atrial fibrillation: Secondary | ICD-10-CM

## 2019-08-24 DIAGNOSIS — L299 Pruritus, unspecified: Secondary | ICD-10-CM

## 2019-08-24 DIAGNOSIS — I10 Essential (primary) hypertension: Secondary | ICD-10-CM | POA: Diagnosis present

## 2019-08-24 LAB — CBC WITH DIFFERENTIAL/PLATELET
Abs Immature Granulocytes: 0.17 10*3/uL — ABNORMAL HIGH (ref 0.00–0.07)
Basophils Absolute: 0 10*3/uL (ref 0.0–0.1)
Basophils Relative: 0 %
Eosinophils Absolute: 0 10*3/uL (ref 0.0–0.5)
Eosinophils Relative: 0 %
HCT: 36.5 % (ref 36.0–46.0)
Hemoglobin: 11 g/dL — ABNORMAL LOW (ref 12.0–15.0)
Immature Granulocytes: 1 %
Lymphocytes Relative: 4 %
Lymphs Abs: 0.7 10*3/uL (ref 0.7–4.0)
MCH: 31.2 pg (ref 26.0–34.0)
MCHC: 30.1 g/dL (ref 30.0–36.0)
MCV: 103.4 fL — ABNORMAL HIGH (ref 80.0–100.0)
Monocytes Absolute: 0.8 10*3/uL (ref 0.1–1.0)
Monocytes Relative: 5 %
Neutro Abs: 15.1 10*3/uL — ABNORMAL HIGH (ref 1.7–7.7)
Neutrophils Relative %: 90 %
Platelets: 415 10*3/uL — ABNORMAL HIGH (ref 150–400)
RBC: 3.53 MIL/uL — ABNORMAL LOW (ref 3.87–5.11)
RDW: 13.7 % (ref 11.5–15.5)
WBC: 16.8 10*3/uL — ABNORMAL HIGH (ref 4.0–10.5)
nRBC: 0.1 % (ref 0.0–0.2)

## 2019-08-24 LAB — BASIC METABOLIC PANEL
Anion gap: 13 (ref 5–15)
BUN: 17 mg/dL (ref 8–23)
CO2: 26 mmol/L (ref 22–32)
Calcium: 8.6 mg/dL — ABNORMAL LOW (ref 8.9–10.3)
Chloride: 103 mmol/L (ref 98–111)
Creatinine, Ser: 0.85 mg/dL (ref 0.44–1.00)
GFR calc Af Amer: >60 mL/min (ref >60)
GFR calc non Af Amer: >60 mL/min (ref >60)
Glucose, Bld: 109 mg/dL — ABNORMAL HIGH (ref 70–99)
Potassium: 3.2 mmol/L — ABNORMAL LOW (ref 3.5–5.1)
Sodium: 142 mmol/L (ref 135–145)

## 2019-08-24 NOTE — ED Triage Notes (Signed)
Patient arrived by [facility] from [facility]. Per EMS patient diagnosed with pneumonia and currently taking antibiotics. EMS called for increased SOB and they found sats in 60s. patint placed on 2L of oxygen and sats 96 on arrival. Patient complains of pain on inspiration, no distress. HOH. Last neb treatment at noon today

## 2019-08-24 NOTE — ED Provider Notes (Addendum)
TIME SEEN: 11:58 PM  CHIEF COMPLAINT: Shortness of breath, pneumonia  HPI: Patient is a 84 year old female who presents to the emergency department from [place] facility with worsening shortness of breath over the past several days.  Was found to be hypoxic at her nursing facility with sats in the 2s.  Improving on nasal cannula.  Does not wear oxygen chronically.  Being treated for pneumonia with Levaquin since Wednesday per patient report.  She reports having chest discomfort that feels like "a balloon in my chest".  She is unaware if she has had fevers.  No vomiting or diarrhea.  She has no history of asthma or COPD per her report.  No lower extremity swelling or pain.  She confirms that she is a DNR/DNI.  She denies recent COVID-19 testing.  She has had both COVID-19 vaccinations.  ROS: See HPI Constitutional: no fever  Eyes: no drainage  ENT: no runny nose   Cardiovascular:   chest pain  Resp:  SOB  GI: no vomiting GU: no dysuria Integumentary: no rash  Allergy: no hives  Musculoskeletal: no leg swelling  Neurological: no slurred speech ROS otherwise negative  PAST MEDICAL HISTORY/PAST SURGICAL HISTORY:  Past Medical History:  Diagnosis Date  . Anemia   . Anxiety   . Arthritis    "all over" (02/11/2018)  . Basal cell carcinoma    "left leg" (02/11/2018)  . Cervical spine fracture ([place]) 12/2017   "C1-2"  . CHF (congestive heart failure) ([place])   . Chronic bronchitis ([place])   . Chronic neck pain    "since I broke my neck 6-8 wk ago" (02/11/2018)  . Diabetic peripheral neuropathy ([place])   . Fibromyalgia   . Fracture of right humerus   . Generalized weakness   . Headache    "weekly" (02/11/2018)  . History of blood transfusion    "related to one of my femur surgeries" (02/11/2018)  . History of echocardiogram    Echo 8/18: EF 50-55, no RWMA, Gr 1 DD, calcified AV leaflets, MAC, trivial MR, mod LAE, PASP 37  . Hypertension   . Hypothyroidism   . Incontinence  of urine   . Ischemic stroke ([place]) 2000   "lost part of the vision in my right eye" (02/11/2018)  . Major depression, chronic   . Myocardial infarction ([place]) 06/2016  . Pacemaker   . Recurrent falls   . Syncope and collapse   . Type 2 diabetes, diet controlled ([place])     MEDICATIONS:  Prior to Admission medications   Medication Sig Start Date End Date Taking? Authorizing Provider  acetaminophen (TYLENOL) 500 MG tablet Take 1,000 mg by mouth 3 (three) times daily.     [provider]  albuterol (PROVENTIL HFA;VENTOLIN HFA) 108 (90 Base) MCG/ACT inhaler Inhale 2 puffs into the lungs every 6 (six) hours as needed for wheezing or shortness of breath. 05/07/18   Angiulli, Lavon Paganini, PA-C  alum & mag hydroxide-simeth (MAALOX/MYLANTA) 200-200-20 MG/5ML suspension Take 20 mLs by mouth every 6 (six) hours as needed for indigestion or heartburn.    [provider]  amiodarone (PACERONE) 200 MG tablet Take 1 tablet (200 mg total) by mouth every morning. 05/07/18   Angiulli, Lavon Paganini, PA-C  apixaban (ELIQUIS) 2.5 MG TABS tablet Take 1 tablet (2.5 mg total) by mouth 2 (two) times daily. 07/13/18   Domenic Polite, MD  atorvastatin (LIPITOR) 20 MG tablet Take 1 tablet (20 mg total) by mouth daily for 30 days. Patient taking  differently: Take 40 mg by mouth daily.  06/05/18   Dessa Phi, DO  budesonide-formoterol [facility]) 160-4.5 MCG/ACT inhaler Inhale 2 puffs into the lungs 2 (two) times daily. Patient taking differently: Inhale 2 puffs into the lungs daily.  05/20/18   Mannam, Hart Robinsons, MD  busPIRone (BUSPAR) 7.5 MG tablet Take 7.5 mg by mouth 2 (two) times daily.    [provider]  calcitonin, salmon, (MIACALCIN/FORTICAL) 200 UNIT/ACT nasal spray Place 1 spray into alternate nostrils daily.  08/04/18   [provider]  cetaphil (CETAPHIL) cream Apply 1 application topically 3 (three) times daily.    [provider]  Cholecalciferol (VITAMIN D) 50 MCG (2000  UT) tablet Take 2,000 Units by mouth daily.    [provider]  docusate sodium (COLACE) 100 MG capsule Take 1 capsule (100 mg total) by mouth daily. 06/24/18   Love, Ivan Anchors, PA-C  ferrous sulfate 325 (65 FE) MG tablet Take 1 tablet (325 mg total) by mouth 2 (two) times daily with a meal. Patient not taking: Reported on 08/02/2019 05/07/18   Angiulli, Lavon Paganini, PA-C  flintstones complete (FLINTSTONES) 60 MG chewable tablet Chew 1 tablet by mouth daily.    [provider]  folic acid (FOLVITE) 1 MG tablet Take 1 tablet (1 mg total) by mouth daily. Patient not taking: Reported on 08/02/2019 01/05/19   Mitzi Hansen, MD  gabapentin (NEURONTIN) 300 MG capsule Take 300 mg by mouth 2 (two) times daily.    [provider]  hydrOXYzine (ATARAX/VISTARIL) 25 MG tablet Take 25 mg by mouth every 6 (six) hours as needed. Stop date 11/12/2018    [provider]  levothyroxine (SYNTHROID, LEVOTHROID) 75 MCG tablet Take 1 tablet (75 mcg total) by mouth daily at 6 (six) AM. 06/24/18   Love, Ivan Anchors, PA-C  Melatonin 5 MG TABS Take 5 mg by mouth at bedtime.    [provider]  nitroGLYCERIN (NITROSTAT) 0.4 MG SL tablet Place 1 tablet (0.4 mg total) under the tongue every 5 (five) minutes as needed for chest pain. Patient not taking: Reported on 08/02/2019 10/11/16   Fay Records, MD  pantoprazole (PROTONIX) 40 MG tablet Take 1 tablet (40 mg total) by mouth daily. Patient taking differently: Take 40 mg by mouth 2 (two) times daily.  06/24/18   Love, Ivan Anchors, PA-C  senna-docusate (SENOKOT-S) 8.6-50 MG tablet Take 1 tablet by mouth at bedtime as needed for mild constipation. 01/05/19   Mitzi Hansen, MD  sertraline (ZOLOFT) 100 MG tablet Take 1 tablet (100 mg total) by mouth daily. Patient not taking: Reported on 08/02/2019 01/05/19   Mitzi Hansen, MD  vitamin B-12 (CYANOCOBALAMIN) 1000 MCG tablet Take 1,000 mcg by mouth daily.    [provider]    ALLERGIES:   Allergies  Allergen Reactions  . Valium [Diazepam] Anxiety    Makes patient hyper  . Robaxin [Methocarbamol] Other (See Comments)    Makes pt loopy/acts drunk  . Tramadol Other (See Comments)    sedation  . Codeine Hives and Itching    Can take with Benadryl   . Darifenacin Itching    Can take with Benadryl  . Darvon [Propoxyphene Hcl] Itching    Can take with Benadryl  . Daypro [Oxaprozin] Itching    Can take with Benadryl  . Enablex [Darifenacin Hydrobromide Er] Itching    Can take with Benadryl  . Oxycodone Itching    Can take with Benadryl   . Propoxyphene Itching  Can take with Benadryl  . Risperdal [Risperidone] Itching    Can take with Benadryl  . Talwin [Pentazocine] Itching    Can take with Benadryl  . Vicodin [Hydrocodone-Acetaminophen] Itching    Can take with Benadryl     SOCIAL HISTORY:  Social History   Tobacco Use  . Smoking status: Former Smoker    Packs/day: 0.50    Years: 4.00    Pack years: 2.00    Types: Cigarettes    Quit date: 08/07/1972    Years since quitting: 47.0  . Smokeless tobacco: Never Used  Substance Use Topics  . Alcohol use: Yes    Comment: once or twice a year    FAMILY HISTORY: Family History  Problem Relation Age of Onset  . Congestive Heart Failure Mother   . Heart attack Father   . Alcoholism Father   . Alcoholism Sister   . Alcoholism Brother   . Alcoholism Brother   . Throat cancer Brother     EXAM: BP (!) 122/58 (BP Location: Left Arm)   Pulse 73   Temp 98.3 F (36.8 C) (Oral)   Resp (!) 22   SpO2 93%  CONSTITUTIONAL: Alert and oriented and responds appropriately to questions. Well-appearing; well-nourished HEAD: Normocephalic EYES: Conjunctivae clear, pupils appear equal, EOM appear intact ENT: normal nose; moist mucous membranes NECK: Supple, normal ROM CARD: RRR; S1 and S2 appreciated; no murmurs, no clicks, no rubs, no gallops RESP: Patient is tachypneic.  Speaking short sentences.  No  significant respiratory distress however.  Sats 74% when I walk in the room on 2 L nasal cannula.  Improved on 10 L through nonrebreather.  She does have some scattered instrument expiratory wheezes and diminished aeration at her bases.  No rhonchi or rales appreciated. ABD/GI: Normal bowel sounds; non-distended; soft, non-tender, no rebound, no guarding, no peritoneal signs, no hepatosplenomegaly BACK:  The back appears normal EXT: Normal ROM in all joints; no deformity noted, no edema; no cyanosis, no calf tenderness or calf swelling SKIN: Normal color for age and race; warm; no rash on exposed skin NEURO: Moves all extremities equally PSYCH: The patient's mood and manner are appropriate.   MEDICAL DECISION MAKING: Patient here with worsening multifocal pneumonia.  Labs obtained in triage reviewed/interpreted and show leukocytosis of 16,000.  Chest x-ray shows worsening pneumonia bilaterally.  Will obtain Covid swab, cultures, lactic.  Will give broad-spectrum antibiotics given she has been on Levaquin without improvement.  She is from a nursing facility and has a previous history of MRSA.  EKG reviewed/interpreted and shows no new ischemic change compared to previous.  We will add on troponins although I feel the chest pain is less likely from ACS.  She does not appear volume overloaded today.  Doubt PE.  She confirms that she is a DNR/DNI.  ED PROGRESS: ABG reassuring (pH 7.396, PCO2 45.7, PO2 132, bicarb 28.1).  Will admit.  1:27 AM Discussed patient's case with hospitalist, Dr. Marlowe Sax.  I have recommended admission and patient (and family if present) agree with this plan. Admitting physician will place admission orders.   I reviewed all nursing notes, vitals, pertinent previous records and reviewed/interpreted all EKGs, lab and urine results, imaging (as available).      EKG Interpretation  Date/Time:  Tuesday August 24 2019 18:59:43 EDT Ventricular Rate:  74 PR Interval:  182 QRS  Duration: 148 QT Interval:  510 QTC Calculation: 566 R Axis:   -77 Text Interpretation: Normal sinus rhythm Right bundle branch  block Left anterior fascicular block  Bifascicular block Confirmed by Lajean Saver [phone]) on 09/20/2019 8:46:04 PM        CRITICAL CARE Performed by: Cyril Mourning Daemien Fronczak   Total critical care time: 55 minutes  Critical care time was exclusive of separately billable procedures and treating other patients.  Critical care was necessary to treat or prevent imminent or life-threatening deterioration.  Critical care was time spent personally by me on the following activities: development of treatment plan with patient and/or surrogate as well as nursing, discussions with consultants, evaluation of patient's response to treatment, examination of patient, obtaining history from patient or surrogate, ordering and performing treatments and interventions, ordering and review of laboratory studies, ordering and review of radiographic studies, pulse oximetry and re-evaluation of patient's condition.   Sheridan Hew was evaluated in Emergency Department on 09/01/2019 for the symptoms described in the history of present illness. She was evaluated in the context of the global COVID-19 pandemic, which necessitated consideration that the patient might be at risk for infection with the SARS-CoV-2 virus that causes COVID-19. Institutional protocols and algorithms that pertain to the evaluation of patients at risk for COVID-19 are in a state of rapid change based on information released by regulatory bodies including the CDC and federal and state organizations. These policies and algorithms were followed during the patient's care in the ED.      Delayni Streed, Delice Bison, DO 08/25/19 [id number]    Dennys Traughber, Delice Bison, DO 08/25/19 [phone]

## 2019-08-24 NOTE — ED Notes (Signed)
Meredith Gonzalez, [facility], [phone] would like an update when available

## 2019-08-24 NOTE — ED Notes (Signed)
Darleen, [facility], [phone] would like an update when available

## 2019-08-25 ENCOUNTER — Inpatient Hospital Stay (HOSPITAL_COMMUNITY): Payer: Medicare Other

## 2019-08-25 DIAGNOSIS — R609 Edema, unspecified: Secondary | ICD-10-CM | POA: Diagnosis present

## 2019-08-25 DIAGNOSIS — I509 Heart failure, unspecified: Secondary | ICD-10-CM

## 2019-08-25 DIAGNOSIS — J9601 Acute respiratory failure with hypoxia: Secondary | ICD-10-CM

## 2019-08-25 DIAGNOSIS — E1142 Type 2 diabetes mellitus with diabetic polyneuropathy: Secondary | ICD-10-CM | POA: Diagnosis present

## 2019-08-25 DIAGNOSIS — J440 Chronic obstructive pulmonary disease with acute lower respiratory infection: Secondary | ICD-10-CM | POA: Diagnosis present

## 2019-08-25 DIAGNOSIS — I4819 Other persistent atrial fibrillation: Secondary | ICD-10-CM | POA: Diagnosis present

## 2019-08-25 DIAGNOSIS — Z681 Body mass index (BMI) 19 or less, adult: Secondary | ICD-10-CM | POA: Diagnosis not present

## 2019-08-25 DIAGNOSIS — J9600 Acute respiratory failure, unspecified whether with hypoxia or hypercapnia: Secondary | ICD-10-CM | POA: Diagnosis present

## 2019-08-25 DIAGNOSIS — I5032 Chronic diastolic (congestive) heart failure: Secondary | ICD-10-CM | POA: Diagnosis present

## 2019-08-25 DIAGNOSIS — I2510 Atherosclerotic heart disease of native coronary artery without angina pectoris: Secondary | ICD-10-CM | POA: Diagnosis present

## 2019-08-25 DIAGNOSIS — Y95 Nosocomial condition: Secondary | ICD-10-CM | POA: Diagnosis present

## 2019-08-25 DIAGNOSIS — I5030 Unspecified diastolic (congestive) heart failure: Secondary | ICD-10-CM

## 2019-08-25 DIAGNOSIS — E039 Hypothyroidism, unspecified: Secondary | ICD-10-CM | POA: Diagnosis present

## 2019-08-25 DIAGNOSIS — Z961 Presence of intraocular lens: Secondary | ICD-10-CM | POA: Diagnosis present

## 2019-08-25 DIAGNOSIS — R0600 Dyspnea, unspecified: Secondary | ICD-10-CM | POA: Diagnosis not present

## 2019-08-25 DIAGNOSIS — Z515 Encounter for palliative care: Secondary | ICD-10-CM | POA: Diagnosis not present

## 2019-08-25 DIAGNOSIS — J189 Pneumonia, unspecified organism: Secondary | ICD-10-CM | POA: Diagnosis present

## 2019-08-25 DIAGNOSIS — I4891 Unspecified atrial fibrillation: Secondary | ICD-10-CM | POA: Diagnosis not present

## 2019-08-25 DIAGNOSIS — R296 Repeated falls: Secondary | ICD-10-CM | POA: Diagnosis present

## 2019-08-25 DIAGNOSIS — I5033 Acute on chronic diastolic (congestive) heart failure: Secondary | ICD-10-CM | POA: Diagnosis not present

## 2019-08-25 DIAGNOSIS — R627 Adult failure to thrive: Secondary | ICD-10-CM | POA: Diagnosis present

## 2019-08-25 DIAGNOSIS — I110 Hypertensive heart disease with heart failure: Secondary | ICD-10-CM | POA: Diagnosis present

## 2019-08-25 DIAGNOSIS — J849 Interstitial pulmonary disease, unspecified: Secondary | ICD-10-CM | POA: Diagnosis not present

## 2019-08-25 DIAGNOSIS — E876 Hypokalemia: Secondary | ICD-10-CM | POA: Diagnosis not present

## 2019-08-25 DIAGNOSIS — Z20822 Contact with and (suspected) exposure to covid-19: Secondary | ICD-10-CM | POA: Diagnosis present

## 2019-08-25 DIAGNOSIS — M797 Fibromyalgia: Secondary | ICD-10-CM | POA: Diagnosis present

## 2019-08-25 DIAGNOSIS — Z7189 Other specified counseling: Secondary | ICD-10-CM | POA: Diagnosis not present

## 2019-08-25 DIAGNOSIS — F411 Generalized anxiety disorder: Secondary | ICD-10-CM | POA: Diagnosis present

## 2019-08-25 DIAGNOSIS — Z66 Do not resuscitate: Secondary | ICD-10-CM | POA: Diagnosis present

## 2019-08-25 DIAGNOSIS — I452 Bifascicular block: Secondary | ICD-10-CM | POA: Diagnosis present

## 2019-08-25 DIAGNOSIS — J8410 Pulmonary fibrosis, unspecified: Secondary | ICD-10-CM | POA: Diagnosis present

## 2019-08-25 DIAGNOSIS — E43 Unspecified severe protein-calorie malnutrition: Secondary | ICD-10-CM | POA: Diagnosis present

## 2019-08-25 DIAGNOSIS — R64 Cachexia: Secondary | ICD-10-CM | POA: Diagnosis present

## 2019-08-25 DIAGNOSIS — J9621 Acute and chronic respiratory failure with hypoxia: Secondary | ICD-10-CM | POA: Diagnosis present

## 2019-08-25 LAB — CBC
HCT: 37.8 % (ref 36.0–46.0)
Hemoglobin: 10.8 g/dL — ABNORMAL LOW (ref 12.0–15.0)
MCH: 32 pg (ref 26.0–34.0)
MCHC: 28.6 g/dL — ABNORMAL LOW (ref 30.0–36.0)
MCV: 111.8 fL — ABNORMAL HIGH (ref 80.0–100.0)
Platelets: 324 10*3/uL (ref 150–400)
RBC: 3.38 MIL/uL — ABNORMAL LOW (ref 3.87–5.11)
RDW: 14.1 % (ref 11.5–15.5)
WBC: 16.2 10*3/uL — ABNORMAL HIGH (ref 4.0–10.5)
nRBC: 0.1 % (ref 0.0–0.2)

## 2019-08-25 LAB — URINE CULTURE: Culture: <10000 — AB

## 2019-08-25 LAB — URINALYSIS, ROUTINE W REFLEX MICROSCOPIC
Glucose, UA: NEGATIVE mg/dL
Ketones, ur: 15 mg/dL — AB
Leukocytes,Ua: NEGATIVE
Nitrite: NEGATIVE
Protein, ur: 100 mg/dL — AB
Specific Gravity, Urine: 1.025 (ref 1.005–1.030)
pH: 6.5 (ref 5.0–8.0)

## 2019-08-25 LAB — PROCALCITONIN
Procalcitonin: 1.77 ng/mL
Procalcitonin: 1.43 ng/mL

## 2019-08-25 LAB — URINALYSIS, MICROSCOPIC (REFLEX)

## 2019-08-25 LAB — SARS CORONAVIRUS 2 BY RT PCR (HOSPITAL ORDER, PERFORMED IN ~~LOC~~ HOSPITAL LAB): SARS Coronavirus 2: NEGATIVE

## 2019-08-25 LAB — ECHOCARDIOGRAM COMPLETE

## 2019-08-25 LAB — TROPONIN I (HIGH SENSITIVITY)
Troponin I (High Sensitivity): 17 ng/L (ref <18)
Troponin I (High Sensitivity): 18 ng/L — ABNORMAL HIGH (ref <18)

## 2019-08-25 LAB — MAGNESIUM: Magnesium: 2 mg/dL (ref 1.7–2.4)

## 2019-08-25 LAB — BRAIN NATRIURETIC PEPTIDE: B Natriuretic Peptide: 399.3 pg/mL — ABNORMAL HIGH (ref 0.0–100.0)

## 2019-08-25 LAB — LACTIC ACID, PLASMA: Lactic Acid, Venous: 1.8 mmol/L (ref 0.5–1.9)

## 2019-08-25 MED ORDER — ADULT MULTIVITAMIN W/MINERALS CH
1.0000 | ORAL_TABLET | Freq: Every day | ORAL | Status: DC
  Administered 2019-08-25 – 2019-08-29 (×4): 1 via ORAL
  Filled 2019-08-25 (×5): qty 1

## 2019-08-25 MED ORDER — APIXABAN 2.5 MG PO TABS: 2.5000 mg | ORAL_TABLET | Freq: Two times a day (BID) | ORAL | Status: DC

## 2019-08-25 MED ORDER — SERTRALINE HCL 100 MG PO TABS
100.0000 mg | ORAL_TABLET | Freq: Every day | ORAL | Status: DC
  Administered 2019-08-25 – 2019-08-29 (×5): 100 mg via ORAL
  Filled 2019-08-25 (×5): qty 1

## 2019-08-25 MED ORDER — FLINTSTONES COMPLETE 60 MG PO CHEW: 1.0000 | CHEWABLE_TABLET | Freq: Every day | ORAL | Status: DC

## 2019-08-25 MED ORDER — SODIUM CHLORIDE 0.9 % IV SOLN: 250.0000 mL | INTRAVENOUS | Status: DC | PRN

## 2019-08-25 MED ORDER — CALCIUM CARBONATE ANTACID 750 MG PO CHEW: 1.0000 | CHEWABLE_TABLET | Freq: Two times a day (BID) | ORAL | Status: DC

## 2019-08-25 MED ORDER — PREGABALIN 25 MG PO CAPS
25.0000 mg | ORAL_CAPSULE | Freq: Two times a day (BID) | ORAL | Status: DC
  Administered 2019-08-25 – 2019-08-29 (×9): 25 mg via ORAL
  Filled 2019-08-25 (×10): qty 1

## 2019-08-25 MED ORDER — DOCUSATE SODIUM 100 MG PO CAPS: 100.0000 mg | ORAL_CAPSULE | Freq: Every day | ORAL | Status: DC | PRN

## 2019-08-25 MED ORDER — ALBUTEROL SULFATE HFA 108 (90 BASE) MCG/ACT IN AERS
8.0000 | INHALATION_SPRAY | Freq: Once | RESPIRATORY_TRACT | Status: AC
  Administered 2019-08-25: 8 via RESPIRATORY_TRACT
  Filled 2019-08-25: qty 6.7

## 2019-08-25 MED ORDER — FUROSEMIDE 10 MG/ML IJ SOLN
40.0000 mg | Freq: Two times a day (BID) | INTRAMUSCULAR | Status: DC
  Administered 2019-08-25: 40 mg via INTRAVENOUS
  Filled 2019-08-25: qty 4

## 2019-08-25 MED ORDER — SODIUM CHLORIDE 0.9% FLUSH: 3.0000 mL | INTRAVENOUS | Status: DC | PRN

## 2019-08-25 MED ORDER — VITAMIN D 50 MCG (2000 UT) PO TABS: 2000.0000 [IU] | ORAL_TABLET | Freq: Every day | ORAL | Status: DC

## 2019-08-25 MED ORDER — SODIUM CHLORIDE 0.9 % IV SOLN
2.0000 g | Freq: Two times a day (BID) | INTRAVENOUS | Status: DC
  Administered 2019-08-25 – 2019-08-27 (×4): 2 g via INTRAVENOUS
  Filled 2019-08-25 (×4): qty 2

## 2019-08-25 MED ORDER — SODIUM CHLORIDE 0.9% FLUSH
3.0000 mL | Freq: Two times a day (BID) | INTRAVENOUS | Status: DC
  Administered 2019-08-25 – 2019-08-29 (×7): 3 mL via INTRAVENOUS

## 2019-08-25 MED ORDER — ALBUTEROL SULFATE (2.5 MG/3ML) 0.083% IN NEBU
3.0000 mL | INHALATION_SOLUTION | Freq: Four times a day (QID) | RESPIRATORY_TRACT | Status: DC | PRN
  Administered 2019-08-28 – 2019-08-30 (×3): 3 mL via RESPIRATORY_TRACT
  Filled 2019-08-25 (×3): qty 3

## 2019-08-25 MED ORDER — VITAMIN D 25 MCG (1000 UNIT) PO TABS
2000.0000 [IU] | ORAL_TABLET | Freq: Every day | ORAL | Status: DC
  Administered 2019-08-25 – 2019-08-29 (×4): 2000 [IU] via ORAL
  Filled 2019-08-25 (×5): qty 2

## 2019-08-25 MED ORDER — POLYETHYLENE GLYCOL 3350 17 G PO PACK: 17.0000 g | PACK | Freq: Every day | ORAL | Status: DC | PRN

## 2019-08-25 MED ORDER — LEVOTHYROXINE SODIUM 75 MCG PO TABS
75.0000 ug | ORAL_TABLET | Freq: Every day | ORAL | Status: DC
  Administered 2019-08-26 – 2019-08-29 (×4): 75 ug via ORAL
  Filled 2019-08-25 (×4): qty 1

## 2019-08-25 MED ORDER — SENNA 8.6 MG PO TABS: 1.0000 | ORAL_TABLET | Freq: Every day | ORAL | Status: DC | PRN

## 2019-08-25 MED ORDER — BUSPIRONE HCL 5 MG PO TABS
15.0000 mg | ORAL_TABLET | Freq: Three times a day (TID) | ORAL | Status: DC
  Administered 2019-08-25 – 2019-08-29 (×11): 15 mg via ORAL
  Filled 2019-08-25 (×14): qty 3

## 2019-08-25 MED ORDER — VANCOMYCIN HCL IN DEXTROSE 1-5 GM/200ML-% IV SOLN
1000.0000 mg | INTRAVENOUS | Status: DC
  Administered 2019-08-26: 1000 mg via INTRAVENOUS
  Filled 2019-08-25: qty 200

## 2019-08-25 MED ORDER — POTASSIUM CHLORIDE CRYS ER 20 MEQ PO TBCR
40.0000 meq | EXTENDED_RELEASE_TABLET | ORAL | Status: AC
  Filled 2019-08-25: qty 2

## 2019-08-25 MED ORDER — CALCITONIN (SALMON) 200 UNIT/ACT NA SOLN
1.0000 | Freq: Every day | NASAL | Status: DC
  Administered 2019-08-25 – 2019-08-29 (×5): 1 via NASAL
  Filled 2019-08-25: qty 3.7

## 2019-08-25 MED ORDER — PANTOPRAZOLE SODIUM 40 MG PO TBEC
40.0000 mg | DELAYED_RELEASE_TABLET | Freq: Every day | ORAL | Status: DC
  Administered 2019-08-25: 40 mg via ORAL
  Filled 2019-08-25: qty 1

## 2019-08-25 MED ORDER — CALCIUM CARBONATE 1250 (500 CA) MG PO TABS
1.0000 | ORAL_TABLET | Freq: Two times a day (BID) | ORAL | Status: DC
  Administered 2019-08-25 – 2019-08-29 (×6): 500 mg via ORAL
  Filled 2019-08-25 (×8): qty 1

## 2019-08-25 MED ORDER — BENZONATATE 100 MG PO CAPS
100.0000 mg | ORAL_CAPSULE | Freq: Three times a day (TID) | ORAL | Status: DC
  Administered 2019-08-25 – 2019-08-30 (×13): 100 mg via ORAL
  Filled 2019-08-25 (×14): qty 1

## 2019-08-25 MED ORDER — MOMETASONE FURO-FORMOTEROL FUM 200-5 MCG/ACT IN AERO
2.0000 | INHALATION_SPRAY | Freq: Two times a day (BID) | RESPIRATORY_TRACT | Status: DC
  Administered 2019-08-25 – 2019-08-30 (×10): 2 via RESPIRATORY_TRACT
  Filled 2019-08-25: qty 8.8

## 2019-08-25 MED ORDER — APIXABAN 2.5 MG PO TABS
2.5000 mg | ORAL_TABLET | Freq: Two times a day (BID) | ORAL | Status: DC
  Administered 2019-08-25 – 2019-08-29 (×9): 2.5 mg via ORAL
  Filled 2019-08-25 (×9): qty 1

## 2019-08-25 MED ORDER — AMIODARONE HCL 200 MG PO TABS: 200.0000 mg | ORAL_TABLET | Freq: Every day | ORAL | Status: DC

## 2019-08-25 MED ORDER — SODIUM CHLORIDE 0.9 % IV SOLN
2.0000 g | Freq: Once | INTRAVENOUS | Status: AC
  Administered 2019-08-25: 2 g via INTRAVENOUS
  Filled 2019-08-25: qty 2

## 2019-08-25 MED ORDER — ACETAMINOPHEN 325 MG PO TABS
650.0000 mg | ORAL_TABLET | ORAL | Status: DC | PRN
  Administered 2019-08-25 – 2019-08-26 (×2): 650 mg via ORAL
  Filled 2019-08-25 (×4): qty 2

## 2019-08-25 MED ORDER — VANCOMYCIN HCL IN DEXTROSE 1-5 GM/200ML-% IV SOLN
1000.0000 mg | Freq: Once | INTRAVENOUS | Status: AC
  Administered 2019-08-25: 1000 mg via INTRAVENOUS
  Filled 2019-08-25: qty 200

## 2019-08-25 NOTE — Progress Notes (Signed)
Initial Nutrition Assessment  RD working remotely.  DOCUMENTATION CODES:   Not applicable  INTERVENTION:   -RD will follow for diet advancement and adjust supplement regimen as appropriate  NUTRITION DIAGNOSIS:   Inadequate oral intake related to dysphagia, inability to eat as evidenced by NPO status.  GOAL:   Patient will meet greater than or equal to 90% of their needs  MONITOR:   Diet advancement, Labs, Weight trends, Skin, I & O's  REASON FOR ASSESSMENT:   Malnutrition Screening Tool    ASSESSMENT:   Meredith Gonzalez is a 84 y.o. female with medical history significant of CHF, chronic bronchitis, CAD status post PCI, diabetes, hypertension, hypothyroidism, CVA, fibromyalgia, recurrent falls presenting to the ED via EMS from her nursing home for evaluation of shortness of breath.  It is reported that patient was recently diagnosed with pneumonia and is currently on Levaquin.  Hypoxic to the 60s with EMS.  Placed on 2 L supplemental oxygen.  Pt admitted with HCAP and CHF exacerbation.  Reviewed I/O's: +100 ml x 24 hours  Attempted to speak with pt via phone, however, no answer.  Per chart review, pt resided at [facility]. Per facility, pt has experienced difficulty swallowing over the past month. She complains that it hurts to swallow and often coughs on liquids.   SLP has evaluated the patient and is currently allowed sips of liquids for comfort. Plan to reassess at later date for potential for diet advancement.   Reviewed wt hx; pt has experienced a 13% wt loss over the past year. While this is not significant for time frame, it is concerning given multiple co-morbidities, prolonged swallowing difficulty, and advanced age.   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  Medications reviewed and include vitamin D3, calcium carbonate, and MVI.   Labs reviewed.   Diet Order:   Diet Order            Diet NPO  time specified  Diet effective now              EDUCATION NEEDS:   No education needs have been identified at this time  Skin:  Skin Assessment: Reviewed RN Assessment  Last BM:  Unknown  Height:   Ht Readings from Last 1 Encounters:  08/02/19 5\' 3"  (1.6 m)    Weight:   Wt Readings from Last 1 Encounters:  08/02/19 50.1 kg    Ideal Body Weight:  52.3 kg  BMI:  There is no height or weight on file to calculate BMI.  Estimated Nutritional Needs:   Kcal:  1300-1500  Protein:  60-75 grams  Fluid:  > 1.3 L    Loistine Chance, RD, LDN, [place] Registered Dietitian II Certified Diabetes Care and Education Specialist Please refer to [facility] for RD and/or RD on-call/weekend/after hours pager

## 2019-08-25 NOTE — Progress Notes (Signed)
CSW notes patient resides at [facility]. CSW will continue to follow for discharge recommendations.   Mellony Danziger LCSW

## 2019-08-25 NOTE — Progress Notes (Signed)
Meredith Gonzalez is a 84 y.o. female patient admitted from ED awake, alert - oriented  X 4 - no acute distress noted.  VSS - Blood pressure (!) 155/65, pulse 79, temperature 98.4 F (36.9 C), temperature source Oral, resp. rate 19, SpO2 96 %.    IV in place, occlusive dsg intact without redness.  Orientation to room, and floor completed with information packet given to patient/family.  Patient declined safety video at this time.  Admission INP armband ID verified,DNR and Fall risk armband with patient/family, and in place.   SR up x 2, fall assessment complete, with patient aable to verbalize understanding of risk associated with falls, and verbalized understanding to call nsg before up out of bed.  Call light within reach, patient able to voice, and demonstrate understanding.  Skin assessment completed with Diana,RN, please see focus assessment  For details.     Will cont to eval and treat per MD orders.  Lenell Antu, RN 08/25/2019 10:49 AM

## 2019-08-25 NOTE — ED Notes (Signed)
[place]

## 2019-08-25 NOTE — Consult Note (Signed)
NAME:  Meredith Gonzalez, MRN:  [id number], DOB:  [birth year], LOS: 0 ADMISSION DATE:  09/08/2019, CONSULTATION DATE:  08/25/19 REFERRING MD:  Raelyn Mora MD, CHIEF COMPLAINT: Dyspnea, management of ILD  Brief History   84 year old with history of persistent atrial fibrillation on amiodarone, diastolic heart failure, hypothyroidism, CVA coronary artery disease, ongoing evaluation in pulmonary clinic for interstitial lung disease.  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  Presenting with worsening hypoxic respiratory failure, chest x-ray with worsening bilateral infiltrates.  She had been treated with Levaquin at her nursing facility with no improvement. PCCM consulted for help with management  Past Medical History    has a past medical history of Anemia, Anxiety, Arthritis, Basal cell carcinoma, Cervical spine fracture (HCC) (12/2017), CHF (congestive heart failure) (HCC), Chronic bronchitis ([place]), Chronic neck pain, Diabetic peripheral neuropathy ([place]), Fibromyalgia, Fracture of right humerus, Generalized weakness, Headache, History of blood transfusion, History of echocardiogram, Hypertension, Hypothyroidism, Incontinence of urine, Ischemic stroke ([place]) (2000), Major depression, chronic, Myocardial infarction ([place]) (06/2016), Pacemaker, Recurrent falls, Syncope and collapse, and Type 2 diabetes, diet controlled ([place]).  Significant Hospital Events   6/2 Admit  Consults:  PCCM  Procedures:    Significant Diagnostic Tests:  High Resolution CT Chest  5/06>  : Alternate pattern. Peribronchial and subpleural ground glass opacity . Traction bronchiectasis. No honeycombing.   Chest x-ray 09/18/2019-worsening bilateral pulmonary infiltrates.  I have reviewed the images personally  Micro Data:  Blood culture 08/25/2019-negative  Antimicrobials:  Cefepime 6/2 > Vancomycin 6/2 >  Interim history/subjective:     Objective   Blood pressure (!) 152/63, pulse 72, temperature 97.9 F (36.6  C), temperature source Axillary, resp. rate 19, height 5\' 4"  (1.626 m), SpO2 98 %.        Intake/Output Summary (Last 24 hours) at 08/25/2019 1717 Last data filed at 08/25/2019 1627 Gross per 24 hour  Intake 100.12 ml  Output 1000 ml  Net -899.88 ml   There were no vitals filed for this visit.  Examination: Gen:      No acute distress, frail, elderly HEENT:  EOMI, sclera anicteric,  Neck:     No masses; no thyromegaly Lungs:    Clear to auscultation bilaterally; normal respiratory effort CV:         Regular rate and rhythm; no murmurs Abd:      + bowel sounds; soft, non-tender; no palpable masses, no distension Ext:    No edema; adequate peripheral perfusion Skin:      Warm and dry; no rash Neuro: alert and oriented x 3   Resolved Hospital Problem list     Assessment & Plan:  Acute hypoxic respiratory failure Baseline interstitial lung disease with alternate pattern on CT scan suggestive of hypersensitivity pneumonitis though she does not have any known exposures We had ordered a basic ILD work-up in clinic but she has not completed it.  We we will send again today Continue broad antibiotic coverage Agree with stopping Lasix as she appears dry Follow echocardiogram, swallow eval Discussed with patient and son.  Told them it will be reasonable to give her a day or 2 on antibiotics to see if she will turn around If Pct is negative them we can try steroids  If no improvement or worsening then they are agreeable to considering hospice Palliative care was already consulted by primary team.  Best practice:  Per primary team  Labs   CBC: Recent Labs  Lab 09/02/2019 1906 08/25/19 0352  WBC 16.8* 16.2*  NEUTROABS 15.1*  --   HGB 11.0* 10.8*  HCT 36.5 37.8  MCV 103.4* 111.8*  PLT 415* 0000000    Basic Metabolic Panel: Recent Labs  Lab 09/15/2019 1906 08/25/19 0352  NA 142  --   K 3.2*  --   CL 103  --   CO2 26  --   GLUCOSE 109*  --   BUN 17  --   CREATININE 0.85  --     CALCIUM 8.6*  --   MG  --  2.0   GFR: CrCl cannot be calculated (Unknown ideal weight.). Recent Labs  Lab 09/11/2019 1906 09/03/2019 2359 08/25/19 0352  PROCALCITON  --   --  1.77  WBC 16.8*  --  16.2*  LATICACIDVEN  --  1.8  --     Liver Function Tests: No results for input(s): AST, ALT, ALKPHOS, BILITOT, PROT, ALBUMIN in the last 168 hours. No results for input(s): LIPASE, AMYLASE in the last 168 hours. No results for input(s): AMMONIA in the last 168 hours.  ABG No results found for: PHART, PCO2ART, PO2ART, HCO3, TCO2, ACIDBASEDEF, O2SAT   Coagulation Profile: No results for input(s): INR, PROTIME in the last 168 hours.  Cardiac Enzymes: No results for input(s): CKTOTAL, CKMB, CKMBINDEX, TROPONINI in the last 168 hours.  HbA1C: Hgb A1c MFr Bld  Date/Time Value Ref Range Status  10/19/2018 05:44 AM 5.7 (H) 4.8 - 5.6 % Final    Comment:    (NOTE) Pre diabetes:          5.7%-6.4% Diabetes:              >6.4% Glycemic control for   <7.0% adults with diabetes   08/18/2018 03:34 AM 5.5 4.8 - 5.6 % Final    Comment:    (NOTE) Pre diabetes:          5.7%-6.4% Diabetes:              >6.4% Glycemic control for   <7.0% adults with diabetes     CBG: No results for input(s): GLUCAP in the last 168 hours.  Review of Systems:   REVIEW OF SYSTEMS:   All negative; except for those that are bolded, which indicate positives.  Constitutional: weight loss, weight gain, night sweats, fevers, chills, fatigue, weakness.  HEENT: headaches, sore throat, sneezing, nasal congestion, post nasal drip, difficulty swallowing, tooth/dental problems, visual complaints, visual changes, ear aches. Neuro: difficulty with speech, weakness, numbness, ataxia. CV:  chest pain, orthopnea, PND, swelling in lower extremities, dizziness, palpitations, syncope.  Resp: cough, hemoptysis, dyspnea, wheezing. GI: heartburn, indigestion, abdominal pain, nausea, vomiting, diarrhea, constipation, change in  bowel habits, loss of appetite, hematemesis, melena, hematochezia.  GU: dysuria, change in color of urine, urgency or frequency, flank pain, hematuria. MSK: joint pain or swelling, decreased range of motion. Psych: change in mood or affect, depression, anxiety, suicidal ideations, homicidal ideations. Skin: rash, itching, bruising.   Past Medical History  She,  has a past medical history of Anemia, Anxiety, Arthritis, Basal cell carcinoma, Cervical spine fracture (HCC) (12/2017), CHF (congestive heart failure) (HCC), Chronic bronchitis ([place]), Chronic neck pain, Diabetic peripheral neuropathy ([place]), Fibromyalgia, Fracture of right humerus, Generalized weakness, Headache, History of blood transfusion, History of echocardiogram, Hypertension, Hypothyroidism, Incontinence of urine, Ischemic stroke ([place]) (2000), Major depression, chronic, Myocardial infarction ([place]) (06/2016), Pacemaker, Recurrent falls, Syncope and collapse, and Type 2 diabetes, diet controlled ([place]).   Surgical History    Past Surgical History:  Procedure Laterality Date  . ABDOMINAL  HYSTERECTOMY    . ANKLE FRACTURE SURGERY Right   . BASAL CELL CARCINOMA EXCISION Left    "leg"  . CARDIAC CATHETERIZATION  ~ 2014  . CARDIOVERSION N/A 02/13/2018   Procedure: CARDIOVERSION;  Surgeon: Sueanne Margarita, MD;  Location[facility] END[facility]COPY;  Service: Cardiovascular;  Laterality: N/A;  . CARDIOVERSION N/A 03/27/2018   Procedure: CARDIOVERSION;  Surgeon: Lelon Perla, MD;  Location: [facility] ENDOSCOPY;  Service: Cardiovascular;  Laterality: N/A;  . CARPAL TUNNEL RELEASE     bilateral  . CATARACT EXTRACTION W/ INTRAOCULAR LENS  IMPLANT, BILATERAL Bilateral   . CHOLECYSTECTOMY OPEN    . COLONOSCOPY    . CORONARY ANGIOPLASTY WITH STENT PLACEMENT  06/2016  . CORONARY STENT INTERVENTION N/A 07/19/2016   Procedure: Coronary Stent Intervention;  Surgeon: Peter M Martinique, MD;  Location: [place] CV LAB;  Service: Cardiovascular;  Laterality: N/A;  .  ENDARTERECTOMY Right 06/03/2018   Procedure: ENDARTERECTOMY CAROTID;  Surgeon: Rosetta Posner, MD;  Location: [place];  Service: Vascular;  Laterality: Right;  . EYE SURGERY     right eye catarace/lens implant  . FEMUR FRACTURE SURGERY Bilateral   . FRACTURE SURGERY    . GASTRIC BYPASS    . INSERT / REPLACE / REMOVE PACEMAKER  ~ 2012  . LEAD REVISION N/A 01/01/2013   Procedure: LEAD REVISION;  Surgeon: Evans Lance, MD;  Location: [facility] CATH LAB;  Service: Cardiovascular;  Laterality: N/A;  . LEFT HEART CATH AND CORONARY ANGIOGRAPHY N/A 07/19/2016   Procedure: Left Heart Cath and Coronary Angiography;  Surgeon: Peter M Martinique, MD;  Location: [place] CV LAB;  Service: Cardiovascular;  Laterality: N/A;  . OVARIAN CYST SURGERY     "one side only"  . PATCH ANGIOPLASTY Right 06/03/2018   Procedure: PATCH ANGIOPLASTY USING HEMASHIELD PLATINUM FINESSE;  Surgeon: Rosetta Posner, MD;  Location: [place];  Service: Vascular;  Laterality: Right;  . TEE WITHOUT CARDIOVERSION N/A 02/13/2018   Procedure: TRANSESOPHAGEAL ECHOCARDIOGRAM (TEE);  Surgeon: Sueanne Margarita, MD;  Location[facility] ENDOSCOPY;  Service: Cardiovascular;  Laterality: N/A;  . TONSILLECTOMY       Social History   reports that she quit smoking about 47 years ago. Her smoking use included cigarettes. She has a 2.00 pack-year smoking history. She has never used smokeless tobacco. She reports current alcohol use. She reports that she does not use drugs.   Family History   Her family history includes Alcoholism in her brother, brother, father, and sister; Congestive Heart Failure in her mother; Heart attack in her father; Throat cancer in her brother.   Allergies Allergies  Allergen Reactions  . Valium [Diazepam] Anxiety    Makes patient hyper  . Robaxin [Methocarbamol] Other (See Comments)    Makes pt loopy/acts drunk  . Tramadol Other (See Comments)    sedation  . Codeine Hives and Itching    Can take with Benadryl   . Darifenacin  Itching    Can take with Benadryl  . Darvon [Propoxyphene Hcl] Itching    Can take with Benadryl  . Daypro [Oxaprozin] Itching    Can take with Benadryl  . Enablex [Darifenacin Hydrobromide Er] Itching    Can take with Benadryl  . Oxycodone Itching    Can take with Benadryl   . Propoxyphene Itching    Can take with Benadryl  . Risperdal [Risperidone] Itching    Can take with Benadryl  . Talwin [Pentazocine] Itching    Can take with Benadryl  .  Vicodin [Hydrocodone-Acetaminophen] Itching    Can take with Benadryl      Home Medications  Prior to Admission medications   Medication Sig Start Date End Date Taking? Authorizing Provider  acetaminophen (TYLENOL) 500 MG tablet Take 1,000 mg by mouth 3 (three) times daily.    Yes [provider]  albuterol (PROVENTIL HFA;VENTOLIN HFA) 108 (90 Base) MCG/ACT inhaler Inhale 2 puffs into the lungs every 6 (six) hours as needed for wheezing or shortness of breath. 05/07/18  Yes Angiulli, Lavon Paganini, PA-C  albuterol (PROVENTIL) (2.5 MG/3ML) 0.083% nebulizer solution Take 2.5 mg by nebulization every 6 (six) hours as needed for wheezing or shortness of breath.   Yes [provider]  albuterol (PROVENTIL) (2.5 MG/3ML) 0.083% nebulizer solution Take 2.5 mg by nebulization every 8 (eight) hours.   Yes [provider]  amiodarone (PACERONE) 200 MG tablet Take 1 tablet (200 mg total) by mouth every morning. Patient taking differently: Take 200 mg by mouth daily.  05/07/18  Yes Angiulli, Lavon Paganini, PA-C  apixaban (ELIQUIS) 2.5 MG TABS tablet Take 1 tablet (2.5 mg total) by mouth 2 (two) times daily. 07/13/18  Yes Domenic Polite, MD  benzonatate (TESSALON) 100 MG capsule Take 100 mg by mouth 3 (three) times daily.   Yes [provider]  budesonide-formoterol (SYMBICORT) 160-4.5 MCG/ACT inhaler Inhale 2 puffs into the lungs 2 (two) times daily. Patient taking differently: Inhale 2 puffs into the lungs in the morning and at  bedtime.  05/20/18  Yes Kindrick Lankford, MD  busPIRone (BUSPAR) 15 MG tablet Take 15 mg by mouth 3 (three) times daily.   Yes [provider]  calcitonin, salmon, (MIACALCIN/FORTICAL) 200 UNIT/ACT nasal spray Place 1 spray into alternate nostrils daily.  08/04/18  Yes [provider]  calcium carbonate (TUMS EX) 750 MG chewable tablet Chew 1 tablet by mouth 2 (two) times daily.   Yes [provider]  Cholecalciferol (VITAMIN D) 50 MCG (2000 UT) tablet Take 2,000 Units by mouth daily.   Yes [provider]  docusate sodium (COLACE) 100 MG capsule Take 1 capsule (100 mg total) by mouth daily. Patient taking differently: Take 100 mg by mouth daily as needed for mild constipation.  06/24/18  Yes Love, Ivan Anchors, PA-C  flintstones complete (FLINTSTONES) 60 MG chewable tablet Chew 1 tablet by mouth daily.   Yes [provider]  hydrOXYzine (ATARAX/VISTARIL) 25 MG tablet Take 25 mg by mouth every 6 (six) hours as needed for itching. Stop date 11/12/2018    Yes [provider]  ipratropium (ATROVENT) 0.02 % nebulizer solution Take 0.5 mg by nebulization every 8 (eight) hours.   Yes [provider]  ipratropium (ATROVENT) 0.02 % nebulizer solution Take 0.5 mg by nebulization every 8 (eight) hours as needed for wheezing or shortness of breath.   Yes [provider]  levofloxacin (LEVAQUIN) 500 MG tablet Take 500 mg by mouth daily.   Yes [provider]  levothyroxine (SYNTHROID, LEVOTHROID) 75 MCG tablet Take 1 tablet (75 mcg total) by mouth daily at 6 (six) AM. 06/24/18  Yes Love, Ivan Anchors, PA-C  Melatonin 10 MG TABS Take 10 mg by mouth at bedtime.   Yes [provider]  nitroGLYCERIN (NITROSTAT) 0.4 MG SL tablet Place 1 tablet (0.4 mg total) under the tongue every 5 (five) minutes as needed for chest pain. 10/11/16  Yes Fay Records, MD  ondansetron (ZOFRAN) 4 MG tablet Take 4 mg by mouth every 6 (six) hours as  needed for nausea  or vomiting.   Yes [provider]  pantoprazole (PROTONIX) 40 MG tablet Take 1 tablet (40 mg total) by mouth daily. 06/24/18  Yes Love, Ivan Anchors, PA-C  polyethylene glycol (MIRALAX / GLYCOLAX) 17 g packet Take 17 g by mouth daily as needed for mild constipation.   Yes [provider]  potassium chloride SA (KLOR-CON) 20 MEQ tablet Take 20 mEq by mouth daily.   Yes [provider]  Pramoxine-Calamine (AVEENO ANTI-ITCH) 1-3 % LOTN Apply topically in the morning, at noon, and at bedtime. To body   Yes [provider]  pregabalin (LYRICA) 25 MG capsule Take 25 mg by mouth 2 (two) times daily.   Yes [provider]  senna (SENOKOT) 8.6 MG TABS tablet Take 1 tablet by mouth daily as needed for mild constipation.   Yes [provider]  sertraline (ZOLOFT) 100 MG tablet Take 1 tablet (100 mg total) by mouth daily. 01/05/19  Yes Christian, Rylee, MD  atorvastatin (LIPITOR) 20 MG tablet Take 1 tablet (20 mg total) by mouth daily for 30 days. Patient not taking: Reported on 08/25/2019 06/05/18   Dessa Phi, DO  ferrous sulfate 325 (65 FE) MG tablet Take 1 tablet (325 mg total) by mouth 2 (two) times daily with a meal. Patient not taking: Reported on 08/02/2019 05/07/18   Angiulli, Lavon Paganini, PA-C  folic acid (FOLVITE) 1 MG tablet Take 1 tablet (1 mg total) by mouth daily. Patient not taking: Reported on 08/02/2019 01/05/19   Mitzi Hansen, MD  senna-docusate (SENOKOT-S) 8.6-50 MG tablet Take 1 tablet by mouth at bedtime as needed for mild constipation. Patient not taking: Reported on 08/25/2019 01/05/19   Mitzi Hansen, MD        Marshell Garfinkel MD [facility] Please see [email or web address] for pager details.  08/25/2019, 5:17 PM

## 2019-08-25 NOTE — Evaluation (Signed)
Clinical/Bedside Swallow Evaluation Patient Details  Name: Meredith Gonzalez MRN: [id number] Date of Birth: [birth year]  Today's Date: 08/25/2019 Time: SLP Start Time (ACUTE ONLY): 39 SLP Stop Time (ACUTE ONLY): 1055 SLP Time Calculation (min) (ACUTE ONLY): 15 min  Past Medical History:  Past Medical History:  Diagnosis Date  . Anemia   . Anxiety   . Arthritis    "all over" (02/11/2018)  . Basal cell carcinoma    "left leg" (02/11/2018)  . Cervical spine fracture ([place]) 12/2017   "C1-2"  . CHF (congestive heart failure) ([place])   . Chronic bronchitis ([place])   . Chronic neck pain    "since I broke my neck 6-8 wk ago" (02/11/2018)  . Diabetic peripheral neuropathy ([place])   . Fibromyalgia   . Fracture of right humerus   . Generalized weakness   . Headache    "weekly" (02/11/2018)  . History of blood transfusion    "related to one of my femur surgeries" (02/11/2018)  . History of echocardiogram    Echo 8/18: EF 50-55, no RWMA, Gr 1 DD, calcified AV leaflets, MAC, trivial MR, mod LAE, PASP 37  . Hypertension   . Hypothyroidism   . Incontinence of urine   . Ischemic stroke ([place]) 2000   "lost part of the vision in my right eye" (02/11/2018)  . Major depression, chronic   . Myocardial infarction ([place]) 06/2016  . Pacemaker   . Recurrent falls   . Syncope and collapse   . Type 2 diabetes, diet controlled ([place])    Past Surgical History:  Past Surgical History:  Procedure Laterality Date  . ABDOMINAL HYSTERECTOMY    . ANKLE FRACTURE SURGERY Right   . BASAL CELL CARCINOMA EXCISION Left    "leg"  . CARDIAC CATHETERIZATION  ~ 2014  . CARDIOVERSION N/A 02/13/2018   Procedure: CARDIOVERSION;  Surgeon: Sueanne Margarita, MD;  Location: [facility] ENDOSCOPY;  Service: Cardiovascular;  Laterality: N/A;  . CARDIOVERSION N/A 03/27/2018   Procedure: CARDIOVERSION;  Surgeon: Lelon Perla, MD;  Location: [facility] ENDOSCOPY;  Service: Cardiovascular;  Laterality: N/A;  . CARPAL TUNNEL RELEASE     bilateral   . CATARACT EXTRACTION W/ INTRAOCULAR LENS  IMPLANT, BILATERAL Bilateral   . CHOLECYSTECTOMY OPEN    . COLONOSCOPY    . CORONARY ANGIOPLASTY WITH STENT PLACEMENT  06/2016  . CORONARY STENT INTERVENTION N/A 07/19/2016   Procedure: Coronary Stent Intervention;  Surgeon: Peter M Martinique, MD;  Location: [place] CV LAB;  Service: Cardiovascular;  Laterality: N/A;  . ENDARTERECTOMY Right 06/03/2018   Procedure: ENDARTERECTOMY CAROTID;  Surgeon: Rosetta Posner, MD;  Location: [place];  Service: Vascular;  Laterality: Right;  . EYE SURGERY     right eye catarace/lens implant  . FEMUR FRACTURE SURGERY Bilateral   . FRACTURE SURGERY    . GASTRIC BYPASS    . INSERT / REPLACE / REMOVE PACEMAKER  ~ 2012  . LEAD REVISION N/A 01/01/2013   Procedure: LEAD REVISION;  Surgeon: Evans Lance, MD;  Location[facility] CATH LAB;  Service: Cardiovascular;  Laterality: N/A;  . LEFT HEART CATH AND CORONARY ANGIOGRAPHY N/A 07/19/2016   Procedure: Left Heart Cath and Coronary Angiography;  Surgeon: Peter M Martinique, MD;  Location: [place] CV LAB;  Service: Cardiovascular;  Laterality: N/A;  . OVARIAN CYST SURGERY     "one side only"  . PATCH ANGIOPLASTY Right 06/03/2018   Procedure: PATCH ANGIOPLASTY USING HEMASHIELD PLATINUM FINESSE;  Surgeon: Rosetta Posner, MD;  Location: [facility]  OR;  Service: Vascular;  Laterality: Right;  . TEE WITHOUT CARDIOVERSION N/A 02/13/2018   Procedure: TRANSESOPHAGEAL ECHOCARDIOGRAM (TEE);  Surgeon: Sueanne Margarita, MD;  Location: [facility] ENDOSCOPY;  Service: Cardiovascular;  Laterality: N/A;  . TONSILLECTOMY     HPI:  Meredith Gonzalez is a 84 y.o. female with medical history significant of CHF, chronic bronchitis, CAD status post PCI, diabetes, hypertension, hypothyroidism, CVA, fibromyalgia, recurrent falls presenting to the ED via EMS from her nursing home for evaluation of shortness of breath.  It is reported that patient was recently diagnosed with pneumonia and is currently on Levaquin. Pt reports  pain with swallowing and family also reports dysphagia.    Assessment / Plan / Recommendation Clinical Impression  Pt demonstrates delayed coughing with even minimal PO intake (ice, teaspoon of water, bite of puree. She complains of chest pain with swallowing, at times severe. She also reports feeling that her water just stops, points to her chest, and comes back. Though an additional oropharyngeal dysphagia is possible, suspect give her report and presentation the she may have a primary esophageal dysphagia. Reported to MD. For now, pt may have ice chips, icees, small bites of ice cream or meds in puree as desired. Will f/u for plan and assist as needed.       Aspiration Risk       Diet Recommendation Ice chips PRN after oral care;Thin liquid   Liquid Administration via: Cup;Spoon Medication Administration: Crushed with puree Supervision: Patient able to self feed    Other  Recommendations Oral Care Recommendations: Oral care QID Other Recommendations: Have oral suction available   Follow up Recommendations Skilled Nursing facility      Frequency and Duration min 2x/week          Prognosis        Swallow Study   General HPI: Meredith Gonzalez is a 84 y.o. female with medical history significant of CHF, chronic bronchitis, CAD status post PCI, diabetes, hypertension, hypothyroidism, CVA, fibromyalgia, recurrent falls presenting to the ED via EMS from her nursing home for evaluation of shortness of breath.  It is reported that patient was recently diagnosed with pneumonia and is currently on Levaquin. Pt reports pain with swallowing and family also reports dysphagia.  Type of Study: Bedside Swallow Evaluation Previous Swallow Assessment: none Diet Prior to this Study: NPO Temperature Spikes Noted: N/A Respiratory Status: Nasal cannula History of Recent Intubation: No Behavior/Cognition: Alert;Cooperative;Pleasant mood Oral Cavity Assessment: Within Functional Limits Oral Care  Completed by SLP: No Oral Cavity - Dentition: Adequate natural dentition Vision: Functional for self-feeding Self-Feeding Abilities: Able to feed self Patient Positioning: Upright in bed Baseline Vocal Quality: Normal Volitional Cough: Strong Volitional Swallow: Able to elicit    Oral/Motor/Sensory Function Overall Oral Motor/Sensory Function: Within functional limits   Ice Chips Ice chips: Impaired Presentation: Spoon Pharyngeal Phase Impairments: Cough - Delayed   Thin Liquid Thin Liquid: Impaired Presentation: Spoon Pharyngeal  Phase Impairments: Cough - Delayed    Nectar Thick Nectar Thick Liquid: Not tested   Honey Thick Honey Thick Liquid: Not tested   Puree Puree: Impaired Pharyngeal Phase Impairments: Cough - Delayed   Solid     Solid: Not tested     Herbie Baltimore, MA CCC-SLP  Acute Rehabilitation Services Pager [phone] Office [phone]  Lynann Beaver 08/25/2019,11:33 AM

## 2019-08-25 NOTE — Progress Notes (Signed)
Patient admitted early morning hours. Seen by nighttime hospitalist. Admission notes reviewed. Patient was examined. Her outpatient records were reviewed.  84 year old female with history of diastolic dysfunction, chronic A. fib and sick sinus syndrome on pacemaker, therapeutic on Eliquis, slowly worsening dyspnea for last 4 months, recently diagnosed interstitial lung disease presented from assisted living facility with worsening shortness of breath despite treatment with Levaquin for pneumonia. In the emergency room found to have hypoxia, currently on 10 L of oxygen. Coughing constantly. It hurts to swallow every time or everything she tries. Coughing constantly . Can not even swallow fluid , coughs and gets esophageal pain.   Assessment: ILD exacerbation/ probable end stage ILD. Failure to thrive, severe protein calorie malnutrition, dysphagia and odynophagia.  Plan: Will discuss with her pulmonologist, treatment, prognosis. Continue bronchodilator therapy. Started on broad-spectrum antibiotics to treat pneumonia, will continue for 24 to 48 hours to see if she has any response. Barium esophagogram will be done. She is likely not a candidate for upper GI endoscopy and dilatation because of being on high flow oxygen. Patient started on high-dose IV Lasix, she is clinically dry, will discontinue IV Lasix. Palliative care consulted. Patient is probably with end-stage interstitial lung disease. She may benefit with ongoing palliative care and hospice discussion. I called and updated patient's son Mr. Meredith Gonzalez on the phone. Updated him about patient's worsening oxygen requirement, with multiple comorbidities and failure to thrive. I informed him about palliative care consultation and ongoing discussion. Patient is DNR and DNI.

## 2019-08-25 NOTE — ED Notes (Signed)
Pt reported her chest "hurt likes it always does".  Per night provider EKG completed.

## 2019-08-25 NOTE — Progress Notes (Signed)
Patient A&O x4. VSS. Pt on 10L high flow of oxygen sating above 95%. Lungs sounds regular and  diminished/ dyspnea with exertion. Free from falls. Pt turns self in bed.  PRN given for cough. Pt voiding adequately during shift.DVT prophylactic: Eliquis. Pain managed. Patient went down for ECHO and swallow study. Bed wheels locked. Phone and call bell within reach. Pt is resting, no distress. POC reviewed with son.

## 2019-08-25 NOTE — Progress Notes (Signed)
Pharmacy Antibiotic Note  Meredith Gonzalez is a 84 y.o. female admitted on 09/19/2019 with pneumonia.  Pharmacy has been consulted for vancomycin and cefepime dosing. Cefepime 2gm and vancomycin 1gm ordered in the ED  Plan: Cont cefepime 2gm IV q12 hours Cont vanc 1gm IV q36 hours F/u renal function, cultures and clinical course     Temp (24hrs), Avg:98.6 F (37 C), Min:98.3 F (36.8 C), Max:98.8 F (37.1 C)  Recent Labs  Lab 09/15/2019 1906 09/11/2019 2359  WBC 16.8*  --   CREATININE 0.85  --   LATICACIDVEN  --  1.8    CrCl cannot be calculated (Unknown ideal weight.).    Allergies  Allergen Reactions  . Valium [Diazepam] Anxiety    Makes patient hyper  . Robaxin [Methocarbamol] Other (See Comments)    Makes pt loopy/acts drunk  . Tramadol Other (See Comments)    sedation  . Codeine Hives and Itching    Can take with Benadryl   . Darifenacin Itching    Can take with Benadryl  . Darvon [Propoxyphene Hcl] Itching    Can take with Benadryl  . Daypro [Oxaprozin] Itching    Can take with Benadryl  . Enablex [Darifenacin Hydrobromide Er] Itching    Can take with Benadryl  . Oxycodone Itching    Can take with Benadryl   . Propoxyphene Itching    Can take with Benadryl  . Risperdal [Risperidone] Itching    Can take with Benadryl  . Talwin [Pentazocine] Itching    Can take with Benadryl  . Vicodin [Hydrocodone-Acetaminophen] Itching    Can take with Benadryl    Thank you for allowing pharmacy to be a part of this patient's care.  [name] 08/25/2019 3:02 AM

## 2019-08-25 NOTE — Consult Note (Signed)
WOC consulted for "blanchable buttocks" and skin tears.  Arrived to patient's room.  Bedside nurse discussed wounds with me. Skin tears and partial thickness lesions over arms and chest.  Patient self reports "I pick at them".  They are covered with silicone foam which is appropriate for topical care per the skin care order set.  No acute s/s of infection at sites.  Updated orders.   Discussed POC with patient and bedside nurse.  Re consult if needed, will not follow at this time. Thanks  Hideko Esselman [employer], RN,CWOCN, CNS, [place] [phone])

## 2019-08-25 NOTE — ED Notes (Signed)
Pt was given a sip of water however she started to cough and stated it hurt to drink.  RN will keep her NPO until Provider advises otherwise

## 2019-08-25 NOTE — Progress Notes (Signed)
Late Entry: ABG not crossing over from iSTAT to Epic, results are as follows:  pH: 7.394 PCO2: 46.1 PO2: 133 HCO3:28.1 BE,B: 3 TCO2: 29 sO2: 99% Ns: 143 K: 3.1 iCa: 1.25 Hct: 33% Hb* via Hct: 11.2 Results reported to K. Ward MD and V. Marlowe Sax, MD

## 2019-08-25 NOTE — ED Notes (Signed)
RN returned Meredith Gonzalez from the facility phone call to update as to her status. The facility stated the pt has been experiencing trouble swallowing for about a month now.  Provider aware.

## 2019-08-25 NOTE — H&P (Addendum)
History and Physical    Meredith Gonzalez NKN:[id number] DOB: 11/16/1935 DOA: 08/30/2019  PCP: Sinclair Ship, MD Patient coming from: [facility] home  Chief Complaint: Shortness of breath  HPI: Meredith Gonzalez is a 84 y.o. female with medical history significant of CHF, chronic bronchitis, CAD status post PCI, diabetes, hypertension, hypothyroidism, CVA, fibromyalgia, recurrent falls presenting to the ED via EMS from her nursing home for evaluation of shortness of breath.  It is reported that patient was recently diagnosed with pneumonia and is currently on Levaquin.  Hypoxic to the 60s with EMS.  Placed on 2 L supplemental oxygen.  Patient reports 1 week history of progressively worsening dyspnea, dry cough, and fatigue. Also complaining of chest pain for the past 2 weeks which she described as "a balloon in my chest that explodes." Denies fevers or chills. States she has been vaccinated for covid.    ED Course: Hypoxic to the 70s on 2 L supplemental oxygen, placed on 10 L oxygen via nonrebreather. ABG with ph 7.39, pCO2 46, and pO2 133. WBC count 16.8. Lactic acid normal. Hgb 11.0, stable compared to previous labs. K 3.2. BNP 399. Initial HS troponin negative, repeat pending. UA and urine culture pending. Blood cx x2 pending. SARS-COV-2 PCR test pending. CXR showing significant interval progression of bilateral airspace opacities compared to the recent CT. Findings concerning for multifocal pneumonia, edema, or a combination of both. Patient received vancomycin and zosyn.  Review of Systems:  All systems reviewed and apart from history of presenting illness, are negative.  Past Medical History:  Diagnosis Date  . Anemia   . Anxiety   . Arthritis    "all over" (02/11/2018)  . Basal cell carcinoma    "left leg" (02/11/2018)  . Cervical spine fracture ([place]) 12/2017   "C1-2"  . CHF (congestive heart failure) ([place])   . Chronic bronchitis ([place])   . Chronic neck pain    "since I broke my  neck 6-8 wk ago" (02/11/2018)  . Diabetic peripheral neuropathy ([place])   . Fibromyalgia   . Fracture of right humerus   . Generalized weakness   . Headache    "weekly" (02/11/2018)  . History of blood transfusion    "related to one of my femur surgeries" (02/11/2018)  . History of echocardiogram    Echo 8/18: EF 50-55, no RWMA, Gr 1 DD, calcified AV leaflets, MAC, trivial MR, mod LAE, PASP 37  . Hypertension   . Hypothyroidism   . Incontinence of urine   . Ischemic stroke ([place]) 2000   "lost part of the vision in my right eye" (02/11/2018)  . Major depression, chronic   . Myocardial infarction ([place]) 06/2016  . Pacemaker   . Recurrent falls   . Syncope and collapse   . Type 2 diabetes, diet controlled ([place])     Past Surgical History:  Procedure Laterality Date  . ABDOMINAL HYSTERECTOMY    . ANKLE FRACTURE SURGERY Right   . BASAL CELL CARCINOMA EXCISION Left    "leg"  . CARDIAC CATHETERIZATION  ~ 2014  . CARDIOVERSION N/A 02/13/2018   Procedure: CARDIOVERSION;  Surgeon: Sueanne Margarita, MD;  Location: [facility] ENDOSCOPY;  Service: Cardiovascular;  Laterality: N/A;  . CARDIOVERSION N/A 03/27/2018   Procedure: CARDIOVERSION;  Surgeon: Lelon Perla, MD;  Location: [facility] ENDOSCOPY;  Service: Cardiovascular;  Laterality: N/A;  . CARPAL TUNNEL RELEASE     bilateral  . CATARACT EXTRACTION W/ INTRAOCULAR LENS  IMPLANT, BILATERAL Bilateral   . CHOLECYSTECTOMY  OPEN    . COLONOSCOPY    . CORONARY ANGIOPLASTY WITH STENT PLACEMENT  06/2016  . CORONARY STENT INTERVENTION N/A 07/19/2016   Procedure: Coronary Stent Intervention;  Surgeon: Peter M Martinique, MD;  Location: [facility] CV LAB;  Service: Cardiovascular;  Laterality: N/A;  . ENDARTERECTOMY Right 06/03/2018   Procedure: ENDARTERECTOMY CAROTID;  Surgeon: Rosetta Posner, MD;  Location: [facility];  Service: Vascular;  Laterality: Right;  . EYE SURGERY     right eye catarace/lens implant  . FEMUR FRACTURE SURGERY Bilateral   . FRACTURE SURGERY      . GASTRIC BYPASS    . INSERT / REPLACE / REMOVE PACEMAKER  ~ 2012  . LEAD REVISION N/A 01/01/2013   Procedure: LEAD REVISION;  Surgeon: Evans Lance, MD;  Location: [facility] CATH LAB;  Service: Cardiovascular;  Laterality: N/A;  . LEFT HEART CATH AND CORONARY ANGIOGRAPHY N/A 07/19/2016   Procedure: Left Heart Cath and Coronary Angiography;  Surgeon: Peter M Martinique, MD;  Location: [facility] CV LAB;  Service: Cardiovascular;  Laterality: N/A;  . OVARIAN CYST SURGERY     "one side only"  . PATCH ANGIOPLASTY Right 06/03/2018   Procedure: PATCH ANGIOPLASTY USING HEMASHIELD PLATINUM FINESSE;  Surgeon: Rosetta Posner, MD;  Location: [facility];  Service: Vascular;  Laterality: Right;  . TEE WITHOUT CARDIOVERSION N/A 02/13/2018   Procedure: TRANSESOPHAGEAL ECHOCARDIOGRAM (TEE);  Surgeon: Sueanne Margarita, MD;  Location: [facility] ENDOSCOPY;  Service: Cardiovascular;  Laterality: N/A;  . TONSILLECTOMY       reports that she quit smoking about 47 years ago. Her smoking use included cigarettes. She has a 2.00 pack-year smoking history. She has never used smokeless tobacco. She reports current alcohol use. She reports that she does not use drugs.  Allergies  Allergen Reactions  . Valium [Diazepam] Anxiety    Makes patient hyper  . Robaxin [Methocarbamol] Other (See Comments)    Makes pt loopy/acts drunk  . Tramadol Other (See Comments)    sedation  . Codeine Hives and Itching    Can take with Benadryl   . Darifenacin Itching    Can take with Benadryl  . Darvon [Propoxyphene Hcl] Itching    Can take with Benadryl  . Daypro [Oxaprozin] Itching    Can take with Benadryl  . Enablex [Darifenacin Hydrobromide Er] Itching    Can take with Benadryl  . Oxycodone Itching    Can take with Benadryl   . Propoxyphene Itching    Can take with Benadryl  . Risperdal [Risperidone] Itching    Can take with Benadryl  . Talwin [Pentazocine] Itching    Can take with Benadryl  . Vicodin [Hydrocodone-Acetaminophen] Itching     Can take with Benadryl     Family History  Problem Relation Age of Onset  . Congestive Heart Failure Mother   . Heart attack Father   . Alcoholism Father   . Alcoholism Sister   . Alcoholism Brother   . Alcoholism Brother   . Throat cancer Brother     Prior to Admission medications   Medication Sig Start Date End Date Taking? Authorizing Provider  acetaminophen (TYLENOL) 500 MG tablet Take 1,000 mg by mouth 3 (three) times daily.    Yes [provider]  albuterol (PROVENTIL HFA;VENTOLIN HFA) 108 (90 Base) MCG/ACT inhaler Inhale 2 puffs into the lungs every 6 (six) hours as needed for wheezing or shortness of breath. 05/07/18  Yes Angiulli, Lavon Paganini, PA-C  albuterol (PROVENTIL) (2.5 MG/3ML) 0.083%  nebulizer solution Take 2.5 mg by nebulization every 6 (six) hours as needed for wheezing or shortness of breath.   Yes [provider]  albuterol (PROVENTIL) (2.5 MG/3ML) 0.083% nebulizer solution Take 2.5 mg by nebulization every 8 (eight) hours.   Yes [provider]  amiodarone (PACERONE) 200 MG tablet Take 1 tablet (200 mg total) by mouth every morning. Patient taking differently: Take 200 mg by mouth daily.  05/07/18  Yes Angiulli, Lavon Paganini, PA-C  apixaban (ELIQUIS) 2.5 MG TABS tablet Take 1 tablet (2.5 mg total) by mouth 2 (two) times daily. 07/13/18  Yes Domenic Polite, MD  benzonatate (TESSALON) 100 MG capsule Take 100 mg by mouth 3 (three) times daily.   Yes [provider]  budesonide-formoterol (SYMBICORT) 160-4.5 MCG/ACT inhaler Inhale 2 puffs into the lungs 2 (two) times daily. Patient taking differently: Inhale 2 puffs into the lungs in the morning and at bedtime.  05/20/18  Yes Mannam, Praveen, MD  busPIRone (BUSPAR) 15 MG tablet Take 15 mg by mouth 3 (three) times daily.   Yes [provider]  calcitonin, salmon, (MIACALCIN/FORTICAL) 200 UNIT/ACT nasal spray Place 1 spray into alternate nostrils daily.  08/04/18  Yes [provider]  calcium carbonate (TUMS EX) 750 MG chewable tablet Chew 1 tablet by mouth 2 (two) times daily.   Yes [provider]  Cholecalciferol (VITAMIN D) 50 MCG (2000 UT) tablet Take 2,000 Units by mouth daily.   Yes [provider]  docusate sodium (COLACE) 100 MG capsule Take 1 capsule (100 mg total) by mouth daily. Patient taking differently: Take 100 mg by mouth daily as needed for mild constipation.  06/24/18  Yes Love, Ivan Anchors, PA-C  flintstones complete (FLINTSTONES) 60 MG chewable tablet Chew 1 tablet by mouth daily.   Yes [provider]  hydrOXYzine (ATARAX/VISTARIL) 25 MG tablet Take 25 mg by mouth every 6 (six) hours as needed for itching. Stop date 11/12/2018    Yes [provider]  ipratropium (ATROVENT) 0.02 % nebulizer solution Take 0.5 mg by nebulization every 8 (eight) hours.   Yes [provider]  ipratropium (ATROVENT) 0.02 % nebulizer solution Take 0.5 mg by nebulization every 8 (eight) hours as needed for wheezing or shortness of breath.   Yes [provider]  levofloxacin (LEVAQUIN) 500 MG tablet Take 500 mg by mouth daily.   Yes [provider]  levothyroxine (SYNTHROID, LEVOTHROID) 75 MCG tablet Take 1 tablet (75 mcg total) by mouth daily at 6 (six) AM. 06/24/18  Yes Love, Ivan Anchors, PA-C  Melatonin 10 MG TABS Take 10 mg by mouth at bedtime.   Yes [provider]  nitroGLYCERIN (NITROSTAT) 0.4 MG SL tablet Place 1 tablet (0.4 mg total) under the tongue every 5 (five) minutes as needed for chest pain. 10/11/16  Yes Fay Records, MD  ondansetron (ZOFRAN) 4 MG tablet Take 4 mg by mouth every 6 (six) hours as needed for nausea or vomiting.   Yes [provider]  pantoprazole (PROTONIX) 40 MG tablet Take 1 tablet (40 mg total) by mouth daily. 06/24/18  Yes Love, Ivan Anchors, PA-C  polyethylene glycol (MIRALAX / GLYCOLAX) 17 g packet Take 17 g by mouth daily as needed for mild constipation.   Yes  [provider]  potassium chloride SA (KLOR-CON) 20 MEQ tablet Take 20 mEq by mouth daily.   Yes [provider]  Pramoxine-Calamine (AVEENO ANTI-ITCH) 1-3 % LOTN Apply topically in the morning, at noon, and at  bedtime. To body   Yes [provider]  pregabalin (LYRICA) 25 MG capsule Take 25 mg by mouth 2 (two) times daily.   Yes [provider]  senna (SENOKOT) 8.6 MG TABS tablet Take 1 tablet by mouth daily as needed for mild constipation.   Yes [provider]  sertraline (ZOLOFT) 100 MG tablet Take 1 tablet (100 mg total) by mouth daily. 01/05/19  Yes Christian, Rylee, MD  atorvastatin (LIPITOR) 20 MG tablet Take 1 tablet (20 mg total) by mouth daily for 30 days. Patient not taking: Reported on 08/25/2019 06/05/18   Dessa Phi, DO  ferrous sulfate 325 (65 FE) MG tablet Take 1 tablet (325 mg total) by mouth 2 (two) times daily with a meal. Patient not taking: Reported on 08/02/2019 05/07/18   Angiulli, Lavon Paganini, PA-C  folic acid (FOLVITE) 1 MG tablet Take 1 tablet (1 mg total) by mouth daily. Patient not taking: Reported on 08/02/2019 01/05/19   Mitzi Hansen, MD  senna-docusate (SENOKOT-S) 8.6-50 MG tablet Take 1 tablet by mouth at bedtime as needed for mild constipation. Patient not taking: Reported on 08/25/2019 01/05/19   Mitzi Hansen, MD    Physical Exam: Vitals:   09/19/2019 2029 08/25/19 0000 08/25/19 0015 08/25/19 0023  BP: (!) 122/58 (!) 169/72 139/62   Pulse: 73 79 77   Resp: (!) 22 17 (!) 22   Temp:    98.8 F (37.1 C)  TempSrc:    Rectal  SpO2: 93% 100% 100%     Physical Exam  Constitutional: She is oriented to person, place, and time.  HENT:  Head: Normocephalic.  Eyes: Right eye exhibits no discharge. Left eye exhibits no discharge.  Cardiovascular: Normal rate, regular rhythm and intact distal pulses.  Pulmonary/Chest: She is in respiratory distress. She has no wheezes. She has rales.  Slightly tachypneic On  supplemental oxygen via nonrebreather Rales up to mid lung fields bilaterally  Abdominal: Soft. Bowel sounds are normal. She exhibits no distension. There is no abdominal tenderness. There is no guarding.  Musculoskeletal:     Cervical back: Neck supple.     Comments: Trace pedal edema  Neurological: She is alert and oriented to person, place, and time.  Skin: Skin is warm and dry. She is not diaphoretic.    Labs on Admission: I have personally reviewed following labs and imaging studies  CBC: Recent Labs  Lab 08/26/2019 1906  WBC 16.8*  NEUTROABS 15.1*  HGB 11.0*  HCT 36.5  MCV 103.4*  PLT 035*   Basic Metabolic Panel: Recent Labs  Lab 09/07/2019 1906  NA 142  K 3.2*  CL 103  CO2 26  GLUCOSE 109*  BUN 17  CREATININE 0.85  CALCIUM 8.6*   GFR: CrCl cannot be calculated (Unknown ideal weight.). Liver Function Tests: No results for input(s): AST, ALT, ALKPHOS, BILITOT, PROT, ALBUMIN in the last 168 hours. No results for input(s): LIPASE, AMYLASE in the last 168 hours. No results for input(s): AMMONIA in the last 168 hours. Coagulation Profile: No results for input(s): INR, PROTIME in the last 168 hours. Cardiac Enzymes: No results for input(s): CKTOTAL, CKMB, CKMBINDEX, TROPONINI in the last 168 hours. BNP (last 3 results) No results for input(s): PROBNP in the last 8760 hours. HbA1C: No results for input(s): HGBA1C in the last 72 hours. CBG: No results for input(s): GLUCAP in the last 168 hours. Lipid Profile: No results for input(s): CHOL, HDL, LDLCALC, TRIG, CHOLHDL, LDLDIRECT in the last 72 hours. Thyroid Function Tests:  No results for input(s): TSH, T4TOTAL, FREET4, T3FREE, THYROIDAB in the last 72 hours. Anemia Panel: No results for input(s): VITAMINB12, FOLATE, FERRITIN, TIBC, IRON, RETICCTPCT in the last 72 hours. Urine analysis:    Component Value Date/Time   COLORURINE YELLOW 01/02/2019 1828   APPEARANCEUR CLEAR 01/02/2019 1828   LABSPEC 1.020  01/02/2019 1828   PHURINE 6.0 01/02/2019 1828   GLUCOSEU NEGATIVE 01/02/2019 1828   HGBUR MODERATE (A) 01/02/2019 1828   BILIRUBINUR NEGATIVE 01/02/2019 1828   KETONESUR 20 (A) 01/02/2019 1828   PROTEINUR 30 (A) 01/02/2019 1828   NITRITE NEGATIVE 01/02/2019 1828   LEUKOCYTESUR SMALL (A) 01/02/2019 1828    Radiological Exams on Admission: DG Chest 2 View  Result Date: 09/21/2019 CLINICAL DATA:  84 year old female with shortness of breath. EXAM: CHEST - 2 VIEW COMPARISON:  Chest radiograph dated 10/19/2018 and chest CT dated 07/29/2019. FINDINGS: There is background of chronic interstitial coarsening and bronchitic changes. Diffuse bilateral interstitial and hazy airspace opacities noted which may represent edema or multifocal pneumonia or combination. Overall significant progression of bilateral airspace opacities compared to the recent CT. No large pleural effusion. There is no pneumothorax. Mild cardiomegaly. Left pectoral pacemaker device. There is discontinuation of 1 of the leads of the pacemaker similar to prior radiograph. Osteopenia with degenerative changes of the spine. An IVC filter is noted. Old healed right humeral fracture. IMPRESSION: Significant interval progression of bilateral airspace opacities compared to the recent CT. Findings represent multifocal pneumonia, or edema, or combination. Clinical correlation and continued follow-up recommended. Electronically Signed   By: Anner Crete M.D.   On: 09/03/2019 19:55    EKG: Independently reviewed. RBBB and LAFB similar to prior tracing. QTc 566. Qt interval increased since prior tracing.   Assessment/Plan Principal Problem:   HCAP (healthcare-associated pneumonia) Active Problems:   Chest pain   A-fib (HCC)   Acute respiratory failure with hypoxia (HCC)   CHF exacerbation (HCC)   Acute hypoxic respiratory failure secondary to HCAP and acute exacerbation of chronic diastolic CHF: Hypoxic to the 60s on RA, placed on 10L O2  via NRB. ABG reassuring. Does have leukocytosis on labs but afebrile and lactic acid normal. BNP 399. Last echo done 09/2018 with normal systolic function and evidence of diastolic dysfunction. CXR with findings concerning for a combination of multifocal PNA and pulmonary edema. Failed outpatient course of Levaquin.  SARS-CoV-2 PCR test negative. -Continue vancomycin and cefepime.  Tessalon Perles for cough.  Check PCT level. Continue to monitor WBC count. Blood cx x2 pending.  Give IV Lasix 40 mg daily. Monitor intake and output, low sodium diet with fluid restriction, and daily weights. Repeat echocardiogram. Cont pulse ox, supplemental O2.  Chest pain: Ongoing for the past 2 weeks per patient and appears atypical. Does have history of CAD but ACS felt to be less likely as HS troponin negative and EKG without acute ischemic changes. -Cardiac monitoring, second set of troponin pending  Mild hypokalemia -Replete K. Check Mag and replete if low. Continue to monitor electrolytes.   QT prolongation on EKG -Cardiac monitoring, keep K >4 and Mag >2.  Avoid QT prolonging drugs.  Repeat EKG in am.  Afib: Has a pacemaker. -Continue home Eliquis.  EKG showing worsening QT prolongation.  Next dose of amiodarone is due in the morning.  Replete electrolytes as mentioned above and repeat EKG in a.m.  Consider resuming amiodarone based on QT interval on EKG and discussion with cardiology.  COPD: Stable. -Continue home inhalers  Hypothyroidism -Continue  Synthroid  ADDENDUM: Dysphagia Nursing staff spoke to the patient's facility and it is reported that she has had dysphagia for a month. -Keep n.p.o. and follow aspiration precautions.  SLP eval ordered.  DVT prophylaxis: Eliquis Code Status: DNR/DNI.  Signed form at bedside and confirmed with patient. Family Communication: No family available at this time. Disposition Plan: Status is: Inpatient  Remains inpatient appropriate because:Ongoing diagnostic  testing needed not appropriate for outpatient work up, IV treatments appropriate due to intensity of illness or inability to take PO and Inpatient level of care appropriate due to severity of illness   Dispo: The patient is from: SNF              Anticipated d/c is to: SNF              Anticipated d/c date is: > 3 days              Patient currently is not medically stable to d/c.  The medical decision making on this patient was of high complexity and the patient is at high risk for clinical deterioration, therefore this is a level 3 visit.  Shela Leff MD [facility]  If 7PM-7AM, please contact night-coverage [email or web address]  08/25/2019, 2:50 AM

## 2019-08-26 DIAGNOSIS — Z7189 Other specified counseling: Secondary | ICD-10-CM

## 2019-08-26 DIAGNOSIS — Z515 Encounter for palliative care: Secondary | ICD-10-CM

## 2019-08-26 DIAGNOSIS — J189 Pneumonia, unspecified organism: Secondary | ICD-10-CM

## 2019-08-26 DIAGNOSIS — Z66 Do not resuscitate: Secondary | ICD-10-CM

## 2019-08-26 LAB — BASIC METABOLIC PANEL
Anion gap: 8 (ref 5–15)
BUN: 18 mg/dL (ref 8–23)
CO2: 29 mmol/L (ref 22–32)
Calcium: 7.6 mg/dL — ABNORMAL LOW (ref 8.9–10.3)
Chloride: 104 mmol/L (ref 98–111)
Creatinine, Ser: 0.75 mg/dL (ref 0.44–1.00)
GFR calc Af Amer: >60 mL/min (ref >60)
GFR calc non Af Amer: >60 mL/min (ref >60)
Glucose, Bld: 108 mg/dL — ABNORMAL HIGH (ref 70–99)
Potassium: 2.6 mmol/L — CL (ref 3.5–5.1)
Sodium: 141 mmol/L (ref 135–145)

## 2019-08-26 LAB — POCT I-STAT 7, (LYTES, BLD GAS, ICA,H+H)
Acid-Base Excess: 3 mmol/L — ABNORMAL HIGH (ref 0.0–2.0)
Bicarbonate: 28.1 mmol/L — ABNORMAL HIGH (ref 20.0–28.0)
Calcium, Ion: 1.25 mmol/L (ref 1.15–1.40)
HCT: 33 % — ABNORMAL LOW (ref 36.0–46.0)
Hemoglobin: 11.2 g/dL — ABNORMAL LOW (ref 12.0–15.0)
O2 Saturation: 99 %
Patient temperature: 98.9
Potassium: 3.1 mmol/L — ABNORMAL LOW (ref 3.5–5.1)
Sodium: 143 mmol/L (ref 135–145)
TCO2: 29 mmol/L (ref 22–32)
pCO2 arterial: 46.1 mmHg (ref 32.0–48.0)
pH, Arterial: 7.394 (ref 7.350–7.450)
pO2, Arterial: 133 mmHg — ABNORMAL HIGH (ref 83.0–108.0)

## 2019-08-26 LAB — RHEUMATOID FACTOR: Rheumatoid fact SerPl-aCnc: 14 IU/mL — ABNORMAL HIGH (ref 0.0–13.9)

## 2019-08-26 LAB — MRSA PCR SCREENING: MRSA by PCR: NEGATIVE

## 2019-08-26 LAB — ANTINUCLEAR ANTIBODIES, IFA: ANA Ab, IFA: NEGATIVE

## 2019-08-26 MED ORDER — POTASSIUM CHLORIDE 10 MEQ/100ML IV SOLN
INTRAVENOUS | Status: AC
  Administered 2019-08-26: 10 meq via INTRAVENOUS
  Filled 2019-08-26: qty 100

## 2019-08-26 MED ORDER — METHYLPREDNISOLONE SODIUM SUCC 40 MG IJ SOLR
40.0000 mg | Freq: Three times a day (TID) | INTRAMUSCULAR | Status: AC
  Administered 2019-08-26 – 2019-08-28 (×8): 40 mg via INTRAVENOUS
  Filled 2019-08-26 (×8): qty 1

## 2019-08-26 MED ORDER — POTASSIUM CHLORIDE 10 MEQ/100ML IV SOLN
10.0000 meq | INTRAVENOUS | Status: AC
  Administered 2019-08-26 (×3): 10 meq via INTRAVENOUS
  Filled 2019-08-26 (×3): qty 100

## 2019-08-26 MED ORDER — LORATADINE 10 MG PO TABS: 10.0000 mg | ORAL_TABLET | Freq: Every day | ORAL | Status: DC | PRN

## 2019-08-26 MED ORDER — MORPHINE SULFATE 10 MG/5ML PO SOLN
2.5000 mg | ORAL | Status: DC | PRN
  Administered 2019-08-26 – 2019-08-29 (×11): 2.5 mg via ORAL
  Filled 2019-08-26 (×11): qty 2

## 2019-08-26 MED ORDER — POTASSIUM CHLORIDE CRYS ER 20 MEQ PO TBCR
20.0000 meq | EXTENDED_RELEASE_TABLET | Freq: Three times a day (TID) | ORAL | Status: AC
  Administered 2019-08-26 – 2019-08-27 (×3): 20 meq via ORAL
  Filled 2019-08-26 (×4): qty 1

## 2019-08-26 MED ORDER — PANTOPRAZOLE SODIUM 40 MG PO TBEC
40.0000 mg | DELAYED_RELEASE_TABLET | Freq: Two times a day (BID) | ORAL | Status: DC
  Administered 2019-08-26 – 2019-08-29 (×6): 40 mg via ORAL
  Filled 2019-08-26 (×7): qty 1

## 2019-08-26 MED ORDER — FUROSEMIDE 10 MG/ML IJ SOLN
20.0000 mg | Freq: Once | INTRAMUSCULAR | Status: AC
  Administered 2019-08-26: 20 mg via INTRAVENOUS
  Filled 2019-08-26: qty 2

## 2019-08-26 NOTE — Progress Notes (Signed)
SLP Cancellation Note  Patient Details Name: Meredith Gonzalez MRN: [id number] DOB: 1936-02-17   Cancelled treatment:       Reason Eval/Treat Not Completed: Medical issues which prohibited therapy. Visited pt at bedside, Rn placing NRB mask due to O2 sats dropping to 78. Pt has an untouched breakfast tray at bedside. Even when respirations are stable, she would be unlikely to tolerate any of the foods on that tray. She is likely to rely on full liquids. Will f/u as needed, but intake is expected to be minimal/absent.   Herbie Baltimore, MA CCC-SLP  Acute Rehabilitation Services Pager [phone] Office [phone]  Lynann Beaver 08/26/2019, 11:52 AM

## 2019-08-26 NOTE — Progress Notes (Addendum)
NAME:  Meredith Gonzalez, MRN:  [id number], DOB:  [birth year], LOS: 1 ADMISSION DATE:  09/03/2019, CONSULTATION DATE: 08/25/2019 REFERRING MD: Dr. Sloan Leiter, CHIEF COMPLAINT: Dyspnea, management of interstitial lung disease  Brief History   84 year old with history of persistent atrial fibrillation on amiodarone, diastolic heart failure, hypothyroidism, CVA coronary artery disease, ongoing evaluation in pulmonary clinic for interstitial lung disease.  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  Presenting with worsening hypoxic respiratory failure, chest x-ray with worsening bilateral infiltrates.  She had been treated with Levaquin at her nursing facility with no improvement. PCCM consulted for help with management  Past Medical History   Past Medical History:  Diagnosis Date  . Anemia   . Anxiety   . Arthritis    "all over" (02/11/2018)  . Basal cell carcinoma    "left leg" (02/11/2018)  . Cervical spine fracture ([place]) 12/2017   "C1-2"  . CHF (congestive heart failure) ([place])   . Chronic bronchitis ([place])   . Chronic neck pain    "since I broke my neck 6-8 wk ago" (02/11/2018)  . Diabetic peripheral neuropathy ([place])   . Fibromyalgia   . Fracture of right humerus   . Generalized weakness   . Headache    "weekly" (02/11/2018)  . History of blood transfusion    "related to one of my femur surgeries" (02/11/2018)  . History of echocardiogram    Echo 8/18: EF 50-55, no RWMA, Gr 1 DD, calcified AV leaflets, MAC, trivial MR, mod LAE, PASP 37  . Hypertension   . Hypothyroidism   . Incontinence of urine   . Ischemic stroke ([place]) 2000   "lost part of the vision in my right eye" (02/11/2018)  . Major depression, chronic   . Myocardial infarction ([place]) 06/2016  . Pacemaker   . Recurrent falls   . Syncope and collapse   . Type 2 diabetes, diet controlled ([place])     Significant Hospital Events   6/2-admit  Consults:  pccm  Procedures:  None  Significant Diagnostic Tests:    High Resolution CT Chest 5/06>  : Alternate pattern. Peribronchial and subpleural ground glass opacity . Traction bronchiectasis. No honeycombing.   Chest x-ray 09/17/2019-worsening bilateral pulmonary infiltrates.  I have reviewed the images personally  Micro Data:  Blood culture 08/25/2019-negative  Antimicrobials:  Cefepime 6/2>> Vancomycin 6/2>>  Interim history/subjective:  Desaturating this morning Cough  Objective   Blood pressure (!) 158/60, pulse 73, temperature 98 F (36.7 C), temperature source Oral, resp. rate 14, height _0  (1.626 m), SpO2 100 %.        Intake/Output Summary (Last 24 hours) at 08/26/2019 0926 Last data filed at 08/26/2019 0855 Gross per 24 hour  Intake 125 ml  Output 625 ml  Net -500 ml   There were no vitals filed for this visit.  Examination: General: Elderly, frail HENT: Moist oral mucosa Lungs: Bibasal rales Cardiovascular: S1-S2 appreciated Abdomen: Bowel sounds appreciated Extremities: No clubbing, no edema Neuro: Moving all extremities GU: Fair output  Chest x-ray reviewed by myself Recent CT scan of the chest reviewed  Resolved Hospital Problem list     Assessment & Plan:  Acute hypoxemic respiratory failure Baseline interstitial lung disease with alternate pattern on CT scan suggestive of hypersensitivity pneumonitis though she does not have known exposures  ILD work-up was ordered in the office but was not completed, was re-sent 6/2  Continue antibiotic coverage Obtain MRSA PCR screening and if negative, vancomycin may be discontinued  Follow echocardiogram-normal  ejection fraction, normal right-sided pressures  Follow swallow evaluation  Plan is to continue antibiotics at present and de-escalate  Steroids may be tried -with worsening oxygen requirement, will start on steroids -She had to be placed on a nonrebreather this morning  If no improvement or worsening then agreeable to hospice Palliative care already  involved   Sherrilyn Rist, MD [facility] Pager: [phone]

## 2019-08-26 NOTE — Progress Notes (Signed)
Called to the bedside to assess pt SP02. Patient sating low 80s. HFNC @ 13L. Patient switched to non rebreather mask on 15L sating above 95%. Fine crackles noted. Ghimire,MD at the bedside at this time.

## 2019-08-26 NOTE — Plan of Care (Addendum)
Patient A&O x4. VSS. Pt on 11 L of oxygen via non-rebreather mask sating above 93%. Potassium supplemented during shift. Lungs sounds Labored and  Diminished/with fine crackles. Free from falls. Frequent changes in bed. PRN given for cough. Pt voiding adequately during shift .DVT prophylactic: Eliquis. Morphine given for SOB. Patient only able to tolerate small sips of fluid and ice chips at this time. HOB elevated.Bed wheels locked. Phone and call bell within reach. Pt is resting, no distress. POC reviewed with patient and son  Problem: Activity: Goal: Ability to tolerate increased activity will improve Outcome: Progressing   Problem: Clinical Measurements: Goal: Ability to maintain a body temperature in the normal range will improve Outcome: Progressing   Problem: Respiratory: Goal: Ability to maintain adequate ventilation will improve Outcome: Progressing Goal: Ability to maintain a clear airway will improve Outcome: Progressing

## 2019-08-26 NOTE — Progress Notes (Signed)
PROGRESS NOTE    Meredith Gonzalez  [id number] DOB: 1935/10/10 DOA: 09/21/2019 PCP: Sinclair Ship, MD    Brief Narrative:  Patient is a 84 year old female with history of diastolic dysfunction, chronic bronchitis, coronary artery disease status post PCI, hypertension and hypothyroidism, fibromyalgia, recently suspected interstitial lung disease with progressive hypoxia presented from assisted living facility with ongoing shortness of breath and hypoxia despite treatment with oral Levaquin.  Retrospectively, patient is having dry cough, difficulty eating with odynophagia, progressive shortness of breath and oxygen requirement.  Her initial symptoms has a started many years ago.  Seen by pulmonary as outpatient and work-up was in progress. In the emergency room, 70% on 2 L oxygen.  Placed on 10 L nonrebreather.  WBC count 16.8.  Lactic acid normal.  Potassium was low.  BNP 400.  Troponins flat.  COVID-19 negative.  Chest x-ray showed significant interval progression of bilateral airspace disease consistent with CT scan of the chest done last month.  Admit to the hospital with hypoxia and pneumonia.   Assessment & Plan:   Principal Problem:   HCAP (healthcare-associated pneumonia) Active Problems:   Chest pain   A-fib (HCC)   Acute respiratory failure with hypoxia (HCC)   CHF exacerbation (HCC)   Respiratory failure, acute ([place])   Palliative care by specialist   Goals of care, counseling/discussion   DNR (do not resuscitate)  Acute on chronic respiratory failure with hypoxia, exacerbation of interstitial lung disease, progressive hypoxic respiratory failure, suspected pneumonia: Patient probably with advanced interstitial lung disease with exacerbation. Treating with broad-spectrum antibiotics and started on IV steroids. Aggressive bronchodilator therapy, will start patient on chest physiotherapy. Followed by pulmonary, probably at end-stage lung disease. Discussed with patient and family,  will continue treatment with bronchodilators, antibiotics and steroids, if no response or worsening condition she is hospice appropriate. Keep oxygen saturations more than 86%.  Diastolic heart failure: Chronic diastolic dysfunction with no evidence of exacerbation.  Her crackles on examination are mostly due to #1.  Potassium replaced, will try Lasix for symptomatic relief.  Chronic atrial fibrillation: Currently rate controlled.  She is therapeutic on Eliquis.  Dysphagia/odynophagia: Chronic dysphagia odynophagia with cough.  Suspect Barrett's esophagus or motility disorder.  Patient is not clinically stable for upper GI endoscopy. Increased dose of Protonix to 40 mg twice a day.  Hypokalemia: Persistent and severe.  Replace aggressively and monitor levels.  We will check electrolytes and magnesium in the morning.  Failure to thrive, moderate protein calorie malnutrition: Patient unable to eat effectively.  Will allow mechanical soft diet with thin liquids.  All-time aspiration precautions.  Seen by speech therapy, however patient has increasing oxygen requirement, less likely benefit with any speech therapy at this time.   DVT prophylaxis: Eliquis Code Status: DNR Family Communication: Patient's son at the bedside Disposition Plan: Status is: Inpatient  Remains inpatient appropriate because:Inpatient level of care appropriate due to severity of illness   Dispo: The patient is from: ALF              Anticipated d/c is to: ALF              Anticipated d/c date is: 3 days              Patient currently is not medically stable to d/c.         Consultants:   PCCM  Procedures:   None  Antimicrobials:  Anti-infectives (From admission, onward)   Start     Dose/Rate Route Frequency  Ordered Stop   08/26/19 1500  vancomycin (VANCOCIN) IVPB 1000 mg/200 mL premix     1,000 mg 200 mL/hr over 60 Minutes Intravenous Every 36 hours 08/25/19 0307     08/25/19 1600  ceFEPIme  (MAXIPIME) 2 g in sodium chloride 0.9 % 100 mL IVPB     2 g 200 mL/hr over 30 Minutes Intravenous Every 12 hours 08/25/19 0307     08/25/19 0015  vancomycin (VANCOCIN) IVPB 1000 mg/200 mL premix     1,000 mg 200 mL/hr over 60 Minutes Intravenous  Once 08/25/19 0000 08/25/19 0250   08/25/19 0015  ceFEPIme (MAXIPIME) 2 g in sodium chloride 0.9 % 100 mL IVPB     2 g 200 mL/hr over 30 Minutes Intravenous  Once 08/25/19 0000 08/25/19 0130         Subjective: Patient seen and examined.  Early morning hours I was called to bedside to see that patient is having increasing oxygen requirement and they changed to nonrebreather.  Patient herself was complaining of shortness of breath and continuous cough. She was given nonrebreather and stabilized. I examined patient again in the afternoon with family at the bedside, she wanting to rest and currently on high flow oxygen.  Denies any chest pain.  It does hurt to swallow.  Objective: Vitals:   08/26/19 1125 08/26/19 1130 08/26/19 1200 08/26/19 1354  BP:   135/62   Pulse:   80 78  Resp:   18 19  Temp:   98.6 F (37 C)   TempSrc:   Oral   SpO2: 97% 94% 97% 97%  Height:        Intake/Output Summary (Last 24 hours) at 08/26/2019 1407 Last data filed at 08/26/2019 1100 Gross per 24 hour  Intake 125 ml  Output 450 ml  Net -325 ml   There were no vitals filed for this visit.  Examination:  General exam: Appears in mild distress.  Thin and cachectic.  On 15 L of oxygen. Respiratory system: Bilateral expiratory crackles.  Upper airway conducted sounds. Cardiovascular system: S1 & S2 heard, RRR.  Trace bilateral pedal edema.. Gastrointestinal system: Abdomen is nondistended, soft and nontender. No organomegaly or masses felt. Normal bowel sounds heard. Central nervous system: Alert and oriented. No focal neurological deficits. Extremities: Symmetric 5 x 5 power. Skin: No rashes, lesions or ulcers Psychiatry: Judgement and insight appear normal.  Mood & affect anxious and flat.    Data Reviewed: I have personally reviewed following labs and imaging studies  CBC: Recent Labs  Lab 09/18/2019 1906 08/25/19 0100 08/25/19 0352  WBC 16.8*  --  16.2*  NEUTROABS 15.1*  --   --   HGB 11.0* 11.2* 10.8*  HCT 36.5 33.0* 37.8  MCV 103.4*  --  111.8*  PLT 415*  --  0000000   Basic Metabolic Panel: Recent Labs  Lab 09/09/2019 1906 08/25/19 0100 08/25/19 0352 08/26/19 0452  NA 142 143  --  141  K 3.2* 3.1*  --  2.6*  CL 103  --   --  104  CO2 26  --   --  29  GLUCOSE 109*  --   --  108*  BUN 17  --   --  18  CREATININE 0.85  --   --  0.75  CALCIUM 8.6*  --   --  7.6*  MG  --   --  2.0  --    GFR: CrCl cannot be calculated (Unknown ideal weight.). Liver Function Tests:  No results for input(s): AST, ALT, ALKPHOS, BILITOT, PROT, ALBUMIN in the last 168 hours. No results for input(s): LIPASE, AMYLASE in the last 168 hours. No results for input(s): AMMONIA in the last 168 hours. Coagulation Profile: No results for input(s): INR, PROTIME in the last 168 hours. Cardiac Enzymes: No results for input(s): CKTOTAL, CKMB, CKMBINDEX, TROPONINI in the last 168 hours. BNP (last 3 results) No results for input(s): PROBNP in the last 8760 hours. HbA1C: No results for input(s): HGBA1C in the last 72 hours. CBG: No results for input(s): GLUCAP in the last 168 hours. Lipid Profile: No results for input(s): CHOL, HDL, LDLCALC, TRIG, CHOLHDL, LDLDIRECT in the last 72 hours. Thyroid Function Tests: No results for input(s): TSH, T4TOTAL, FREET4, T3FREE, THYROIDAB in the last 72 hours. Anemia Panel: No results for input(s): VITAMINB12, FOLATE, FERRITIN, TIBC, IRON, RETICCTPCT in the last 72 hours. Sepsis Labs: Recent Labs  Lab 09/07/2019 2359 08/25/19 0352 08/25/19 1746  PROCALCITON  --  1.77 1.43  LATICACIDVEN 1.8  --   --     Recent Results (from the past 240 hour(s))  Blood culture (routine x 2)     Status: None (Preliminary result)    Collection Time: 08/25/19 12:12 AM   Specimen: BLOOD  Result Value Ref Range Status   Specimen Description BLOOD RIGHT ANTECUBITAL  Final   Special Requests   Final    BOTTLES DRAWN AEROBIC AND ANAEROBIC Blood Culture adequate volume   Culture   Final    NO GROWTH 1 DAY Performed at [facility], [place] [address], [place], [place] [address]    Report Status PENDING  Incomplete  Blood culture (routine x 2)     Status: None (Preliminary result)   Collection Time: 08/25/19 12:12 AM   Specimen: BLOOD RIGHT FOREARM  Result Value Ref Range Status   Specimen Description BLOOD RIGHT FOREARM  Final   Special Requests   Final    BOTTLES DRAWN AEROBIC AND ANAEROBIC Blood Culture adequate volume   Culture   Final    NO GROWTH 1 DAY Performed at [facility], [place] [address] [place], [place] [address]    Report Status PENDING  Incomplete  SARS Coronavirus 2 by RT PCR (hospital order, performed in [facility] hospital lab) Nasopharyngeal Nasopharyngeal Swab     Status: None   Collection Time: 08/25/19 12:23 AM   Specimen: Nasopharyngeal Swab  Result Value Ref Range Status   SARS Coronavirus 2 NEGATIVE NEGATIVE Final    Comment: (NOTE) SARS-CoV-2 target nucleic acids are NOT DETECTED. The SARS-CoV-2 RNA is generally detectable in upper and lower respiratory specimens during the acute phase of infection. The lowest concentration of SARS-CoV-2 viral copies this assay can detect is 250 copies / mL. A negative result does not preclude SARS-CoV-2 infection and should not be used as the sole basis for treatment or other patient management decisions.  A negative result may occur with improper specimen collection / handling, submission of specimen other than nasopharyngeal swab, presence of viral mutation(s) within the areas targeted by this assay, and inadequate number of viral copies (<250 copies / mL). A negative result must be combined with clinical observations, patient history, and  epidemiological information. Fact Sheet for Patients:   StrictlyIdeas.no Fact Sheet for Healthcare Providers: BankingDealers.co.za This test is not yet approved or cleared  by the [place] FDA and has been authorized for detection and/or diagnosis of SARS-CoV-2 by FDA under an Emergency Use Authorization (EUA).  This  EUA will remain in effect (meaning this test can be used) for the duration of the COVID-19 declaration under Section 564(b)(1) of the Act, 21 U.S.C. section 360bbb-3(b)(1), unless the authorization is terminated or revoked sooner. Performed at [facility], [place] [address], [place], [place] [address]   Urine culture     Status: Abnormal   Collection Time: 08/25/19  3:27 AM   Specimen: Urine, Random  Result Value Ref Range Status   Specimen Description URINE, RANDOM  Final   Special Requests NONE  Final   Culture (A)  Final    <10,000 COLONIES/mL INSIGNIFICANT GROWTH Performed at [facility], [place] [address] [place], [place] [address]    Report Status 08/25/2019 FINAL  Final  MRSA PCR Screening     Status: None   Collection Time: 08/26/19 11:54 AM   Specimen: Nasal Mucosa; Nasopharyngeal  Result Value Ref Range Status   MRSA by PCR NEGATIVE NEGATIVE Final    Comment:        The GeneXpert MRSA Assay (FDA approved for NASAL specimens only), is one component of a comprehensive MRSA colonization surveillance program. It is not intended to diagnose MRSA infection nor to guide or monitor treatment for MRSA infections. Performed at [facility], [place] [address], [place], [place] [address]          Radiology Studies: DG Chest 2 View  Result Date: 09/05/2019 CLINICAL DATA:  84 year old female with shortness of breath. EXAM: CHEST - 2 VIEW COMPARISON:  Chest radiograph dated 10/19/2018 and chest CT dated 07/29/2019. FINDINGS: There is background of chronic interstitial coarsening and bronchitic  changes. Diffuse bilateral interstitial and hazy airspace opacities noted which may represent edema or multifocal pneumonia or combination. Overall significant progression of bilateral airspace opacities compared to the recent CT. No large pleural effusion. There is no pneumothorax. Mild cardiomegaly. Left pectoral pacemaker device. There is discontinuation of 1 of the leads of the pacemaker similar to prior radiograph. Osteopenia with degenerative changes of the spine. An IVC filter is noted. Old healed right humeral fracture. IMPRESSION: Significant interval progression of bilateral airspace opacities compared to the recent CT. Findings represent multifocal pneumonia, or edema, or combination. Clinical correlation and continued follow-up recommended. Electronically Signed   By: Anner Crete M.D.   On: 08/27/2019 19:55   ECHOCARDIOGRAM COMPLETE  Result Date: 08/25/2019    ECHOCARDIOGRAM REPORT   Patient Name:   DERIANA GOHLKE Date of Exam: 08/25/2019 Medical Rec #:  [id number]     Height:       63.0 in Accession #:    [id number]    Weight:       110.4 lb Date of Birth:  10/29/1935     BSA:          1.502 m Patient Age:    25 years      BP:           152/63 mmHg Patient Gender: F             HR:           81 bpm. Exam Location:  Inpatient Procedure: 2D Echo Indications:    CHF 428  History:        Patient has prior history of Echocardiogram examinations, most                 recent 10/19/2018. CHF, Previous Myocardial Infarction,  Pacemaker, Stroke; Risk Factors:Hypertension and Diabetes.  Sonographer:    Jannett Celestine RDCS (AE) Referring Phys: [id number] VASUNDHRA Woodson  1. Left ventricular ejection fraction, by estimation, is 60 to 65%. The left ventricle has normal function. The left ventricle has no regional wall motion abnormalities. There is mild left ventricular hypertrophy. Left ventricular diastolic parameters are consistent with Grade I diastolic dysfunction (impaired  relaxation).  2. Right ventricular systolic function is normal. The right ventricular size is normal. There is normal pulmonary artery systolic pressure. The estimated right ventricular systolic pressure is 123XX123 mmHg.  3. The mitral valve is normal in structure. No evidence of mitral valve regurgitation. No evidence of mitral stenosis.  4. The aortic valve is tricuspid. Aortic valve regurgitation is not visualized. Mild to moderate aortic valve sclerosis/calcification is present, without any evidence of aortic stenosis.  5. The inferior vena cava is normal in size with greater than 50% respiratory variability, suggesting right atrial pressure of 3 mmHg. FINDINGS  Left Ventricle: Left ventricular ejection fraction, by estimation, is 60 to 65%. The left ventricle has normal function. The left ventricle has no regional wall motion abnormalities. The left ventricular internal cavity size was normal in size. There is  mild left ventricular hypertrophy. Left ventricular diastolic parameters are consistent with Grade I diastolic dysfunction (impaired relaxation). Right Ventricle: The right ventricular size is normal. No increase in right ventricular wall thickness. Right ventricular systolic function is normal. There is normal pulmonary artery systolic pressure. The tricuspid regurgitant velocity is 2.86 m/s, and  with an assumed right atrial pressure of 3 mmHg, the estimated right ventricular systolic pressure is 123XX123 mmHg. Left Atrium: Left atrial size was normal in size. Right Atrium: Right atrial size was normal in size. Pericardium: There is no evidence of pericardial effusion. Mitral Valve: The mitral valve is normal in structure. There is mild thickening of the mitral valve leaflet(s). There is mild calcification of the mitral valve leaflet(s). No evidence of mitral valve regurgitation. No evidence of mitral valve stenosis. Tricuspid Valve: The tricuspid valve is normal in structure. Tricuspid valve regurgitation is  mild. Aortic Valve: The aortic valve is tricuspid. Aortic valve regurgitation is not visualized. Mild to moderate aortic valve sclerosis/calcification is present, without any evidence of aortic stenosis. Pulmonic Valve: The pulmonic valve was normal in structure. Pulmonic valve regurgitation is not visualized. Aorta: The aortic root is normal in size and structure. Venous: The inferior vena cava is normal in size with greater than 50% respiratory variability, suggesting right atrial pressure of 3 mmHg. IAS/Shunts: No atrial level shunt detected by color flow Doppler. Additional Comments: A pacer wire is visualized in the right ventricle.  LEFT VENTRICLE PLAX 2D LVIDd:         4.80 cm LVIDs:         3.20 cm LV PW:         1.20 cm LV IVS:        0.90 cm LVOT diam:     1.80 cm LV SV:         50 LV SV Index:   33 LVOT Area:     2.54 cm  LEFT ATRIUM             Index       RIGHT ATRIUM           Index LA diam:        3.60 cm 2.40 cm/m  RA Area:     10.40 cm LA Vol (A2C):  40.9 ml 27.23 ml/m RA Volume:   18.70 ml  12.45 ml/m LA Vol (A4C):   22.6 ml 15.05 ml/m LA Biplane Vol: 30.5 ml 20.30 ml/m  AORTIC VALVE LVOT Vmax:   94.20 cm/s LVOT Vmean:  71.200 cm/s LVOT VTI:    0.195 m  AORTA Ao Root diam: 2.80 cm MITRAL VALVE               TRICUSPID VALVE MV Area (PHT): 2.84 cm    TR Peak grad:   32.7 mmHg MV Decel Time: 267 msec    TR Vmax:        286.00 cm/s MV E velocity: 68.90 cm/s MV A velocity: 96.30 cm/s  SHUNTS MV E/A ratio:  0.72        Systemic VTI:  0.20 m                            Systemic Diam: 1.80 cm Loralie Champagne MD Electronically signed by Loralie Champagne MD Signature Date/Time: 08/25/2019/6:33:29 PM    Final    DG ESOPHAGUS W SINGLE CM (SOL OR THIN BA)  Result Date: 08/25/2019 CLINICAL DATA:  Dysphagia for 1 month. Coughing with meals and regurgitation. EXAM: ESOPHOGRAM/BARIUM SWALLOW TECHNIQUE: Single contrast examination was performed using  thin barium. FLUOROSCOPY TIME:  Fluoroscopy Time:  2 minutes,  30 seconds Radiation Exposure Index (if provided by the fluoroscopic device): 16.0 mGy Number of Acquired Spot Images: 0 COMPARISON:  Chest CT 07/29/2019 FINDINGS: The patient is unable to stand or turn while, in the entire exam was performed with patient in the left posterior oblique position. Primary peristaltic waves were disrupted on 03/04 swallows. Secondary and tertiary contractions are noted. The patient appears to have a gastric bypass. There is scattered gas in the esophagus, most notable in the upper thoracic esophagus forming transient filling defects in the barium column which are somewhat irregular. Esophageal varices can cause a similar appearance although more typically encountered in the lower esophagus rather than the upper esophagus. Just above the level of the aortic arch, there a double contour of the esophageal wall suggesting ulceration and focal mural irregularity. This is shown for example on image 11/1. Fold thickening in the remainder of the esophagus probably reflects esophagitis. IMPRESSION: 1. Please note that due to patient infirmity, all images were taken in the LPO position, and other positions and typical portions of the esophagram examination could not be performed. This reduces diagnostic sensitivity and specificity. 2. Mural irregularity with a suggestion of contrast tracking within the wall of the upper thoracic esophagus at and above the level of the aortic arch, possibly from ulceration, benign esophageal pseudodiverticulosis, or esophageal peridiverticulitis. This localized contrast within the wall of the esophagus appears contained. Assessment of this region complicated by the presence of filling defects thought to be gas bubbles which congregated in this area. This could be further assessed with endoscopy if clinically warranted. 3. Fold thickening in the remainder of the esophagus probably reflects esophagitis. 4. Nonspecific esophageal dysmotility. Electronically Signed    By: Van Clines M.D.   On: 08/25/2019 13:32        Scheduled Meds: . apixaban  2.5 mg Oral BID  . benzonatate  100 mg Oral TID  . busPIRone  15 mg Oral TID  . calcitonin (salmon)  1 spray Alternating Nares Daily  . calcium carbonate  1 tablet Oral BID WC  . cholecalciferol  2,000 Units Oral Daily  .  furosemide  20 mg Intravenous Once  . levothyroxine  75 mcg Oral Q0600  . methylPREDNISolone (SOLU-MEDROL) injection  40 mg Intravenous Q8H  . mometasone-formoterol  2 puff Inhalation BID  . multivitamin with minerals  1 tablet Oral Daily  . pantoprazole  40 mg Oral BID  . potassium chloride  20 mEq Oral TID  . pregabalin  25 mg Oral BID  . sertraline  100 mg Oral Daily  . sodium chloride flush  3 mL Intravenous Q12H   Continuous Infusions: . sodium chloride    . ceFEPime (MAXIPIME) IV 2 g (08/26/19 0409)  . potassium chloride 10 mEq (08/26/19 1352)  . vancomycin       LOS: 1 day    Time spent: 30 minutes    Barb Merino, MD [facility] Pager [phone]

## 2019-08-26 NOTE — Consult Note (Signed)
Palliative Medicine Inpatient Consult Note  Reason for consult:  Goals of care in the setting of interstitial lung disease  HPI:  Per intake H&P --> 84 year old with history of persistent atrial fibrillation on amiodarone, diastolic heart failure, hypothyroidism, CVA coronary artery disease, ongoing evaluation in pulmonary clinic for interstitial lung disease.  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  Presenting with worsening hypoxic respiratory failure, chest x-ray with worsening bilateral infiltrates.  She had been treated with Levaquin at her nursing facility with no improvement.  Palliative care was asked to help with goals of care conversations.  Clinical Assessment/Goals of Care: I have reviewed medical records including EPIC notes, labs and imaging, received report from bedside RN, assessed the patient.    I met with [name] to further discuss diagnosis prognosis, GOC, EOL wishes, disposition and options.   I introduced Palliative Medicine as specialized medical care for people living with serious illness. It focuses on providing relief from the symptoms and stress of a serious illness. The goal is to improve quality of life for both the patient and the family.  I asked Annahi to tell me about herself. She shares that she is from [place], [place] originally. She spent much of her life in [place]. She is a widow, her husband passed six years ago. They share two sons one of whom passed of alcoholism and the other who lives in [place], [place]. She is a retired third Land. Lynnet enjoys reading historical romance novels in her spare time. She does not consider herself to be religious.  Glenisha lives at [facility] facility. She cares for basic need independently.   Devorah shares that she was unaware that her lung disease had gotten to this state until a few weeks ago.  A detailed discussion was had today regarding advanced directives  which were completed by her previously. She states that her son, Lennette Bihari has a copy of these. Lennette Bihari is the patients surrogate decision maker per our conversation.  Concepts specific to code status, artifical feeding and hydration, continued IV antibiotics and rehospitalization was had. . I completed a MOST form today. The patient and family outlined their wishes for the following treatment decisions:  Cardiopulmonary Resuscitation: Do Not Attempt Resuscitation (DNR/No CPR)  Medical Interventions: Limited Additional Interventions: Use medical treatment, IV fluids and cardiac monitoring as indicated, DO NOT USE intubation or mechanical ventilation. May consider use of less invasive airway support such as BiPAP or CPAP. Also provide comfort measures. Transfer to the hospital if indicated. Avoid intensive care.   Antibiotics: Determine use of limitation of antibiotics when infection occurs  IV Fluids: IV fluids for a defined trial period  Feeding Tube: No feeding tube   The difference between a aggressive medical intervention path  and a palliative comfort care path for this patient at this time was had. The patient is hopeful for some improvement at the present time. If no improvements are made we discussed possible transition to comfort oriented versus hospice oriented care. Explained hospice as a benefit for patients with terminal illness and < 6 months trajectory of life.   Discussed the importance of continued conversation with family and their  medical providers regarding overall plan of care and treatment options, ensuring decisions are within the context of the patients values and GOCs.  Addendum: Patients son, Lennette Bihari was called. He shares that Nohemi looks far more frail than on the week prior. He states that he is aware she is not doing well though  would like to continue the current course of treatment to see if any improvements can be made. Plan to continue 24-48 hours of treatment, if no  improvement plan for additional conversations. Explained what comfort oriented care versus hospice care looks like.   Decision Maker: Patient can make decisions for herself.  SUMMARY OF RECOMMENDATIONS   DNAR/DNI  MOST completed and placed in chart  DNR completed and placed in chart  Ongoing [place] discussions  Continue additional 24-48 hrs  Chaplain consult  Symptom management as below  Code Status/Advance Care Planning: DNAR/DNI   Symptom Management:  Dyspnea:  - Morphine 2.48m PO Q4H PRN  Itching:  - Claritin 127mPO QDay PRN   Palliative Prophylaxis:   Turn Q2H, Pain, Delirium  Additional Recommendations (Limitations, Scope, Preferences):  Treat the treatable   Psycho-social/Spiritual:   Desire for further Chaplaincy support: Yes  Additional Recommendations: Education on hospice   Prognosis: Poor in the setting of progressive ILD  Discharge Planning: Discharge plan is unclear presently.   PPS: 20%    This conversation/these recommendations were discussed with patient primary care team, Dr. GhSloan LeiterTime In: 1000 Time Out: 1115 Total Time: 75 Greater than 50%  of this time was spent counseling and coordinating care related to the above assessment and plan.  [facility] Team Cell Phone: [phone] utilize secure chat with additional questions, if there is no response within 30 minutes please call the above phone number  Palliative Medicine Team providers are available by phone from 7am to 7pm daily and can be reached through the team cell phone.  Should this patient require assistance outside of these hours, please call the patient's attending physician.

## 2019-08-27 LAB — CBC WITH DIFFERENTIAL/PLATELET
Abs Immature Granulocytes: 0.15 10*3/uL — ABNORMAL HIGH (ref 0.00–0.07)
Basophils Absolute: 0 10*3/uL (ref 0.0–0.1)
Basophils Relative: 0 %
Eosinophils Absolute: 0 10*3/uL (ref 0.0–0.5)
Eosinophils Relative: 0 %
HCT: 35 % — ABNORMAL LOW (ref 36.0–46.0)
Hemoglobin: 10.3 g/dL — ABNORMAL LOW (ref 12.0–15.0)
Immature Granulocytes: 1 %
Lymphocytes Relative: 2 %
Lymphs Abs: 0.2 10*3/uL — ABNORMAL LOW (ref 0.7–4.0)
MCH: 30.7 pg (ref 26.0–34.0)
MCHC: 29.4 g/dL — ABNORMAL LOW (ref 30.0–36.0)
MCV: 104.2 fL — ABNORMAL HIGH (ref 80.0–100.0)
Monocytes Absolute: 0.2 10*3/uL (ref 0.1–1.0)
Monocytes Relative: 1 %
Neutro Abs: 14 10*3/uL — ABNORMAL HIGH (ref 1.7–7.7)
Neutrophils Relative %: 96 %
Platelets: 393 10*3/uL (ref 150–400)
RBC: 3.36 MIL/uL — ABNORMAL LOW (ref 3.87–5.11)
RDW: 13.9 % (ref 11.5–15.5)
WBC: 14.6 10*3/uL — ABNORMAL HIGH (ref 4.0–10.5)
nRBC: 0 % (ref 0.0–0.2)

## 2019-08-27 LAB — CYCLIC CITRUL PEPTIDE ANTIBODY, IGG/IGA: CCP Antibodies IgG/IgA: 6 units (ref 0–19)

## 2019-08-27 LAB — BASIC METABOLIC PANEL
Anion gap: 11 (ref 5–15)
BUN: 24 mg/dL — ABNORMAL HIGH (ref 8–23)
CO2: 29 mmol/L (ref 22–32)
Calcium: 7.8 mg/dL — ABNORMAL LOW (ref 8.9–10.3)
Chloride: 100 mmol/L (ref 98–111)
Creatinine, Ser: 0.77 mg/dL (ref 0.44–1.00)
GFR calc Af Amer: >60 mL/min (ref >60)
GFR calc non Af Amer: >60 mL/min (ref >60)
Glucose, Bld: 177 mg/dL — ABNORMAL HIGH (ref 70–99)
Potassium: 3.8 mmol/L (ref 3.5–5.1)
Sodium: 140 mmol/L (ref 135–145)

## 2019-08-27 LAB — MAGNESIUM: Magnesium: 2.4 mg/dL (ref 1.7–2.4)

## 2019-08-27 MED ORDER — CEFDINIR 300 MG PO CAPS
300.0000 mg | ORAL_CAPSULE | Freq: Two times a day (BID) | ORAL | Status: DC
  Administered 2019-08-27 – 2019-08-29 (×5): 300 mg via ORAL
  Filled 2019-08-27 (×6): qty 1

## 2019-08-27 MED ORDER — ORAL CARE MOUTH RINSE
15.0000 mL | Freq: Two times a day (BID) | OROMUCOSAL | Status: DC
  Administered 2019-08-27 – 2019-08-31 (×10): 15 mL via OROMUCOSAL

## 2019-08-27 MED ORDER — CHLORHEXIDINE GLUCONATE 0.12 % MT SOLN
15.0000 mL | Freq: Two times a day (BID) | OROMUCOSAL | Status: DC
  Administered 2019-08-27 – 2019-08-31 (×8): 15 mL via OROMUCOSAL
  Filled 2019-08-27 (×7): qty 15

## 2019-08-27 NOTE — Progress Notes (Signed)
[facility] Liaison note.  Contacted by Elmer Picker, NP for family interest in hospice services. Spoke with son, Lennette Bihari to explain services and answer questions.  Son states he wants to see how his mother responds to treatment over the weekend and asked for a follow up call or visit on Sunday.  [facility] hospital liaison will follow as requested on Sunday.  Please call with any hospice related questions.      Farrel Gordon, RN, [facility] (listed on AMION under [place])   [phone]

## 2019-08-27 NOTE — Progress Notes (Signed)
Pharmacy Antibiotic Note  Meredith Gonzalez is a 84 y.o. female admitted on 09/10/2019 with pneumonia.  Continues on Vancomycin and Cefepime Culture negative to date Scr stable  Plan: Cont cefepime 2gm IV q12 hours Cont vanc 1gm IV q36 hours F/u renal function, cultures and clinical course  Height: 5\' 4"  (162.6 cm) Weight: 50.4 kg (111 lb 1.8 oz) IBW/kg (Calculated) : 54.7  Temp (24hrs), Avg:97.9 F (36.6 C), Min:97.4 F (36.3 C), Max:98.6 F (37 C)  Recent Labs  Lab 09/02/2019 1906 09/06/2019 2359 08/25/19 0352 08/26/19 0452  WBC 16.8*  --  16.2*  --   CREATININE 0.85  --   --  0.75  LATICACIDVEN  --  1.8  --   --     Estimated Creatinine Clearance: 43.1 mL/min (by C-G formula based on SCr of 0.75 mg/dL).    Allergies  Allergen Reactions  . Valium [Diazepam] Anxiety    Makes patient hyper  . Robaxin [Methocarbamol] Other (See Comments)    Makes pt loopy/acts drunk  . Tramadol Other (See Comments)    sedation  . Codeine Hives and Itching    Can take with Benadryl   . Darifenacin Itching    Can take with Benadryl  . Darvon [Propoxyphene Hcl] Itching    Can take with Benadryl  . Daypro [Oxaprozin] Itching    Can take with Benadryl  . Enablex [Darifenacin Hydrobromide Er] Itching    Can take with Benadryl  . Oxycodone Itching    Can take with Benadryl   . Propoxyphene Itching    Can take with Benadryl  . Risperdal [Risperidone] Itching    Can take with Benadryl  . Talwin [Pentazocine] Itching    Can take with Benadryl  . Vicodin [Hydrocodone-Acetaminophen] Itching    Can take with Benadryl    Thank you for allowing pharmacy to be a part of this patient's care. Anette Guarneri, PharmD 08/27/2019 7:42 AM

## 2019-08-27 NOTE — Progress Notes (Signed)
Met with patient's son at the bedside.  We discussed different outcomes.  We decided to give her another 24 to 48 hours and see how she does over the weekend.  If persistently not able to eat and have oxygen requirement, will discharge her back to assisted living facility with hospice in place.

## 2019-08-27 NOTE — Progress Notes (Signed)
° °  Palliative Medicine Inpatient Follow Up Note   Reason for consult:  Goals of care in the setting of interstitial lung disease  HPI:  Per intake H&P --> 84 year old with history of persistent atrial fibrillation on amiodarone, diastolic heart failure, hypothyroidism, CVA coronary artery disease, ongoingevaluationinpulmonary clinic for interstitial lung disease. 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 Presenting with worsening hypoxic respiratory failure, chest x-ray with worsening bilateral infiltrates. She had been treated with Levaquin at her nursing facility with no improvement.  Palliative care was asked to help with goals of care conversations.  Today's Discussion (08/27/2019): Chart reviewed. Met with Meredith Gonzalez and her son, Meredith Gonzalez at bedside. We discussed the various outcomes that are possible in [name] present situation. Talked about what her wishes are. She shares that she would ideally like to get back to [facility] facility. We talked about continuing treatment for her ILD and her ongoing dysphagia.   Meredith Gonzalez and [name] were interested to get more information on hospice.   Presently the plan will be to continue current measures. We will check in throughout the weekend to offer support. If patient does not improve the goal of symptom relief and comfort will be sought.  Discussed the importance of continued conversation with family and their  medical providers regarding overall plan of care and treatment options, ensuring decisions are within the context of the patients values and GOCs.  Decision Maker: Patient can make decisions for herself.  SUMMARY OF RECOMMENDATIONS DNAR/DNI  MOST completed and placed in chart  DNR completed and placed in chart  Continue current measures throughout the weekend  Chaplain consult  Symptom management as below  Code Status/Advance Care Planning: DNAR/DNI  Symptom  Management: Dyspnea:                 - Morphine 2.'5mg'$  PO Q4H PRN  Itching:                 - Claritin '10mg'$  PO QDay PRN  Dysphagia:  - Swallow evaluation has been done, likely to rely on full liquids   Time Spent: 35 Greater than 50% of the time was spent in counseling and coordination of care ______________________________________________________________________________________ [name] Team Team Cell Phone: [phone] Please utilize secure chat with additional questions, if there is no response within 30 minutes please call the above phone number  Palliative Medicine Team providers are available by phone from 7am to 7pm daily and can be reached through the team cell phone.  Should this patient require assistance outside of these hours, please call the patient's attending physician.

## 2019-08-27 NOTE — Progress Notes (Signed)
NAME:  Meredith Gonzalez, MRN:  [id number], DOB:  [birth year], LOS: 2 ADMISSION DATE:  09/19/2019, CONSULTATION DATE: 08/25/2019 REFERRING MD: Dr. Sloan Leiter, CHIEF COMPLAINT: Dyspnea, management of interstitial lung disease  Brief History   84 year old with history of persistent atrial fibrillation on amiodarone, diastolic heart failure, hypothyroidism, CVA coronary artery disease, ongoing evaluation in pulmonary clinic for interstitial lung disease.  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  Presenting with worsening hypoxic respiratory failure, chest x-ray with worsening bilateral infiltrates.  She had been treated with Levaquin at her nursing facility with no improvement. PCCM consulted for help with management  Past Medical History   Past Medical History:  Diagnosis Date  . Anemia   . Anxiety   . Arthritis    "all over" (02/11/2018)  . Basal cell carcinoma    "left leg" (02/11/2018)  . Cervical spine fracture ([place]) 12/2017   "C1-2"  . CHF (congestive heart failure) ([place])   . Chronic bronchitis ([place])   . Chronic neck pain    "since I broke my neck 6-8 wk ago" (02/11/2018)  . Diabetic peripheral neuropathy ([place])   . Fibromyalgia   . Fracture of right humerus   . Generalized weakness   . Headache    "weekly" (02/11/2018)  . History of blood transfusion    "related to one of my femur surgeries" (02/11/2018)  . History of echocardiogram    Echo 8/18: EF 50-55, no RWMA, Gr 1 DD, calcified AV leaflets, MAC, trivial MR, mod LAE, PASP 37  . Hypertension   . Hypothyroidism   . Incontinence of urine   . Ischemic stroke ([place]) 2000   "lost part of the vision in my right eye" (02/11/2018)  . Major depression, chronic   . Myocardial infarction ([place]) 06/2016  . Pacemaker   . Recurrent falls   . Syncope and collapse   . Type 2 diabetes, diet controlled ([place])     Significant Hospital Events   6/2-admit  Consults:  pccm  Procedures:  None  Significant Diagnostic Tests:    High Resolution CT Chest 5/06>  : Alternate pattern. Peribronchial and subpleural ground glass opacity . Traction bronchiectasis. No honeycombing.   Chest x-ray 08/26/2019-worsening bilateral pulmonary infiltrates.  I have reviewed the images personally  Micro Data:  Blood culture 08/25/2019-negative  Antimicrobials:  Cefepime 6/2-6/4 Vancomycin 6/2-6/4 omnicef 6/4>>  Interim history/subjective:  Appears more comfortable today on oxygen supplementation  Objective   Blood pressure (!) 171/65, pulse 75, temperature 97.6 F (36.4 C), temperature source Oral, resp. rate 18, height _0  (1.626 m), weight 50.4 kg, SpO2 93 %.        Intake/Output Summary (Last 24 hours) at 08/27/2019 0956 Last data filed at 08/27/2019 0700 Gross per 24 hour  Intake 1564.59 ml  Output 1425 ml  Net 139.59 ml   Filed Weights   08/27/19 0502  Weight: 50.4 kg    Examination: General: Elderly, frail, denies significant shortness of breath HENT: Moist oral mucosa Lungs: Bibasal rales, clear anteriorly Cardiovascular: S1-S2 appreciated Abdomen: Bowel sounds appreciated  Chest x-ray reviewed by myself 6/1 Recent CT scan of the chest reviewed  Resolved Hospital Problem list     Assessment & Plan:  Acute hypoxemic respiratory failure Baseline interstitial lung disease with alternate pattern on CT scan suggestive of hypersensitivity pneumonitis though she does not have known exposures  ILD work-up was ordered in the office but was not completed, was re-sent 6/2  Continue antibiotic coverage-D escalated-vancomycin discontinued, cefepime switched to [facility]  Echocardiogram-normal ejection fraction, normal right-sided pressures  Follow swallow evaluation  Started on steroids -She had to be placed on a nonrebreather 6/3-she is better from this perspective today  If no improvement or worsening then agreeable to hospice Palliative care already involved   Sherrilyn Rist, MD [place] PCCM Pager:  [phone]

## 2019-08-27 NOTE — Progress Notes (Signed)
PROGRESS NOTE    Kahlan Engebretson  VOJ:[id number] DOB: [birth year] DOA: 09/22/2019 PCP: Sinclair Ship, MD    Brief Narrative:  Patient is a 84 year old female with history of diastolic dysfunction, chronic bronchitis, coronary artery disease status post PCI, hypertension and hypothyroidism, fibromyalgia, recently suspected interstitial lung disease with progressive hypoxia presented from assisted living facility with ongoing shortness of breath and hypoxia despite treatment with oral Levaquin.  Retrospectively, patient is having dry cough, difficulty eating with odynophagia, progressive shortness of breath and oxygen requirement.  Her initial symptoms has a started many years ago.  Seen by pulmonary as outpatient and work-up was in progress. In the emergency room, 70% on 2 L oxygen.  Placed on 10 L nonrebreather.  WBC count 16.8.  Lactic acid normal.  Potassium was low.  BNP 400.  Troponins flat.  COVID-19 negative.  Chest x-ray showed significant interval progression of bilateral airspace disease consistent with CT scan of the chest done last month.  Admit to the hospital with hypoxia and pneumonia.   Assessment & Plan:   Principal Problem:   HCAP (healthcare-associated pneumonia) Active Problems:   Chest pain   A-fib (HCC)   Acute respiratory failure with hypoxia (HCC)   CHF exacerbation (HCC)   Respiratory failure, acute ([place])   Palliative care by specialist   Goals of care, counseling/discussion   DNR (do not resuscitate)  Acute on chronic respiratory failure with hypoxia, exacerbation of interstitial lung disease, progressive hypoxic respiratory failure, suspected pneumonia: Patient probably with advanced interstitial lung disease with exacerbation. Treating with broad-spectrum antibiotics and IV steroids. Aggressive bronchodilator therapy, will start patient on chest physiotherapy. Followed by pulmonary, probably at end-stage lung disease. Discussed with patient and family, will  continue treatment with bronchodilators, antibiotics and steroids, if no response or worsening condition she is hospice appropriate. Keep oxygen saturations more than 86%. Discontinue vancomycin.  De-escalate cefepime to cefdinir.  Diastolic heart failure: Chronic diastolic dysfunction with no evidence of exacerbation.  Her crackles on examination are mostly due to #1.  Potassium replaced, intermittent Lasix.  Chronic atrial fibrillation: Currently rate controlled.  She is therapeutic on Eliquis.  Dysphagia/odynophagia: Chronic dysphagia odynophagia with cough.  Suspect Barrett's esophagus or motility disorder.  Patient is not clinically stable for upper GI endoscopy. Increased dose of Protonix to 40 mg twice a day. This is probably terminal.  Hypokalemia: Replaced with improvement.  Magnesium is adequate.  Failure to thrive, moderate protein calorie malnutrition: Patient unable to eat effectively.  Will allow mechanical soft diet with thin liquids.  All-time aspiration precautions.  Seen by speech therapy, however patient has increasing oxygen requirement, less likely benefit with any speech therapy at this time.  Goal of care: I have met and discussed with patient's son Mr. Taneia Mealor at bedside.  Will discussed her progressive debility, poor nutrition and worsening oxygenation.  We discussed that this is probably end of her life.  Mr. Kerry Dory wants to give her a few days of trial of therapies and if no adequate improvement, -He prefers her to go back to assisted living facility with hospice -He is also open to hospice home if that is appropriate for her.   DVT prophylaxis: Eliquis Code Status: DNR/DNI Family Communication: Patient's son at the bedside 6/3 Disposition Plan: Status is: Inpatient  Remains inpatient appropriate because:Inpatient level of care appropriate due to severity of illness   Dispo: The patient is from: ALF              Anticipated d/c  is to: ALF               Anticipated d/c date is: 3 days              Patient currently is not medically stable to d/c.   Consultants:   PCCM  Procedures:   None  Antimicrobials:  Anti-infectives (From admission, onward)   Start     Dose/Rate Route Frequency Ordered Stop   08/27/19 1000  cefdinir (OMNICEF) capsule 300 mg     300 mg Oral Every 12 hours 08/27/19 0959 09/01/19 0959   08/26/19 1500  vancomycin (VANCOCIN) IVPB 1000 mg/200 mL premix  Status:  Discontinued     1,000 mg 200 mL/hr over 60 Minutes Intravenous Every 36 hours 08/25/19 0307 08/27/19 1306   08/25/19 1600  ceFEPIme (MAXIPIME) 2 g in sodium chloride 0.9 % 100 mL IVPB  Status:  Discontinued     2 g 200 mL/hr over 30 Minutes Intravenous Every 12 hours 08/25/19 0307 08/27/19 0959   08/25/19 0015  vancomycin (VANCOCIN) IVPB 1000 mg/200 mL premix     1,000 mg 200 mL/hr over 60 Minutes Intravenous  Once 08/25/19 0000 08/25/19 0250   08/25/19 0015  ceFEPIme (MAXIPIME) 2 g in sodium chloride 0.9 % 100 mL IVPB     2 g 200 mL/hr over 30 Minutes Intravenous  Once 08/25/19 0000 08/25/19 0130         Subjective: Patient seen and examined.  Remains on 10 to 15 L of oxygen.  Looks very sick. In the morning, I asked her to take a sip of water, she started coughing violently. I asked her to take 1 teaspoonful of omelette, she could not swallow without coughing. Remains afebrile.  Objective: Vitals:   08/27/19 0502 08/27/19 0700 08/27/19 0804 08/27/19 1200  BP:   (!) 171/65 131/60  Pulse:  71 75 73  Resp:  _0 Temp:   97.6 F (36.4 C) 97.7 F (36.5 C)  TempSrc:   Oral Oral  SpO2:  91% 93% 96%  Weight: 50.4 kg     Height:        Intake/Output Summary (Last 24 hours) at 08/27/2019 1306 Last data filed at 08/27/2019 0700 Gross per 24 hour  Intake 1514.59 ml  Output 1400 ml  Net 114.59 ml   Filed Weights   08/27/19 0502  Weight: 50.4 kg    Examination:  General exam: Appears in mild distress due to breathing.  Thin and  cachectic.  On 15 L of oxygen. Respiratory system: Bilateral expiratory crackles.  Upper airway conducted sounds. Cardiovascular system: S1 & S2 heard, RRR.  No edema. Gastrointestinal system: Abdomen is nondistended, soft and nontender. No organomegaly or masses felt. Normal bowel sounds heard. Central nervous system: Alert and oriented. No focal neurological deficits. Extremities: Symmetric 5 x 5 power. Skin: No rashes, lesions or ulcers Psychiatry: Judgement and insight appear normal. Mood & affect anxious and flat.    Data Reviewed: I have personally reviewed following labs and imaging studies  CBC: Recent Labs  Lab 09/01/2019 1906 08/25/19 0100 08/25/19 0352 08/27/19 0626  WBC 16.8*  --  16.2* 14.6*  NEUTROABS 15.1*  --   --  14.0*  HGB 11.0* 11.2* 10.8* 10.3*  HCT 36.5 33.0* 37.8 35.0*  MCV 103.4*  --  111.8* 104.2*  PLT 415*  --  324 701   Basic Metabolic Panel: Recent Labs  Lab 08/28/2019 1906 08/25/19 0100 08/25/19 0352 08/26/19 0452 08/27/19 7793  NA 142 143  --  141 140  K 3.2* 3.1*  --  2.6* 3.8  CL 103  --   --  104 100  CO2 26  --   --  29 29  GLUCOSE 109*  --   --  108* 177*  BUN 17  --   --  18 24*  CREATININE 0.85  --   --  0.75 0.77  CALCIUM 8.6*  --   --  7.6* 7.8*  MG  --   --  2.0  --  2.4   GFR: Estimated Creatinine Clearance: 43.1 mL/min (by C-G formula based on SCr of 0.77 mg/dL). Liver Function Tests: No results for input(s): AST, ALT, ALKPHOS, BILITOT, PROT, ALBUMIN in the last 168 hours. No results for input(s): LIPASE, AMYLASE in the last 168 hours. No results for input(s): AMMONIA in the last 168 hours. Coagulation Profile: No results for input(s): INR, PROTIME in the last 168 hours. Cardiac Enzymes: No results for input(s): CKTOTAL, CKMB, CKMBINDEX, TROPONINI in the last 168 hours. BNP (last 3 results) No results for input(s): PROBNP in the last 8760 hours. HbA1C: No results for input(s): HGBA1C in the last 72 hours. CBG: No  results for input(s): GLUCAP in the last 168 hours. Lipid Profile: No results for input(s): CHOL, HDL, LDLCALC, TRIG, CHOLHDL, LDLDIRECT in the last 72 hours. Thyroid Function Tests: No results for input(s): TSH, T4TOTAL, FREET4, T3FREE, THYROIDAB in the last 72 hours. Anemia Panel: No results for input(s): VITAMINB12, FOLATE, FERRITIN, TIBC, IRON, RETICCTPCT in the last 72 hours. Sepsis Labs: Recent Labs  Lab 09/06/2019 2359 08/25/19 0352 08/25/19 1746  PROCALCITON  --  1.77 1.43  LATICACIDVEN 1.8  --   --     Recent Results (from the past 240 hour(s))  Blood culture (routine x 2)     Status: None (Preliminary result)   Collection Time: 08/25/19 12:12 AM   Specimen: BLOOD  Result Value Ref Range Status   Specimen Description BLOOD RIGHT ANTECUBITAL  Final   Special Requests   Final    BOTTLES DRAWN AEROBIC AND ANAEROBIC Blood Culture adequate volume   Culture   Final    NO GROWTH 2 DAYS Performed at [facility], [place] [address], [place], [place] [address]    Report Status PENDING  Incomplete  Blood culture (routine x 2)     Status: None (Preliminary result)   Collection Time: 08/25/19 12:12 AM   Specimen: BLOOD RIGHT FOREARM  Result Value Ref Range Status   Specimen Description BLOOD RIGHT FOREARM  Final   Special Requests   Final    BOTTLES DRAWN AEROBIC AND ANAEROBIC Blood Culture adequate volume   Culture   Final    NO GROWTH 2 DAYS Performed at [facility], [place] [address] [place], [place] [address]    Report Status PENDING  Incomplete  SARS Coronavirus 2 by RT PCR (hospital order, performed in [facility] hospital lab) Nasopharyngeal Nasopharyngeal Swab     Status: None   Collection Time: 08/25/19 12:23 AM   Specimen: Nasopharyngeal Swab  Result Value Ref Range Status   SARS Coronavirus 2 NEGATIVE NEGATIVE Final    Comment: (NOTE) SARS-CoV-2 target nucleic acids are NOT DETECTED. The SARS-CoV-2 RNA is generally detectable in upper and  lower respiratory specimens during the acute phase of infection. The lowest concentration of SARS-CoV-2 viral copies this assay can detect is 250 copies / mL. A negative result does not preclude SARS-CoV-2 infection and  should not be used as the sole basis for treatment or other patient management decisions.  A negative result may occur with improper specimen collection / handling, submission of specimen other than nasopharyngeal swab, presence of viral mutation(s) within the areas targeted by this assay, and inadequate number of viral copies (<250 copies / mL). A negative result must be combined with clinical observations, patient history, and epidemiological information. Fact Sheet for Patients:   StrictlyIdeas.no Fact Sheet for Healthcare Providers: BankingDealers.co.za This test is not yet approved or cleared  by the [place] FDA and has been authorized for detection and/or diagnosis of SARS-CoV-2 by FDA under an Emergency Use Authorization (EUA).  This EUA will remain in effect (meaning this test can be used) for the duration of the COVID-19 declaration under Section 564(b)(1) of the Act, 21 U.S.C. section 360bbb-3(b)(1), unless the authorization is terminated or revoked sooner. Performed at [facility], [place] [address], [place], [place] [address]   Urine culture     Status: Abnormal   Collection Time: 08/25/19  3:27 AM   Specimen: Urine, Random  Result Value Ref Range Status   Specimen Description URINE, RANDOM  Final   Special Requests NONE  Final   Culture (A)  Final    <10,000 COLONIES/mL INSIGNIFICANT GROWTH Performed at [facility], [place] [address] [place], [place] [address]    Report Status 08/25/2019 FINAL  Final  MRSA PCR Screening     Status: None   Collection Time: 08/26/19 11:54 AM   Specimen: Nasal Mucosa; Nasopharyngeal  Result Value Ref Range Status   MRSA by PCR NEGATIVE NEGATIVE Final     Comment:        The GeneXpert MRSA Assay (FDA approved for NASAL specimens only), is one component of a comprehensive MRSA colonization surveillance program. It is not intended to diagnose MRSA infection nor to guide or monitor treatment for MRSA infections. Performed at [facility], [place] [address] [place], [place] [address]          Radiology Studies: ECHOCARDIOGRAM COMPLETE  Result Date: 08/25/2019    ECHOCARDIOGRAM REPORT   Patient Name:   KAELI NICHELSON Date of Exam: 08/25/2019 Medical Rec #:  [id number]     Height:       63.0 in Accession #:    [id number]    Weight:       110.4 lb Date of Birth:  1936-02-15     BSA:          1.502 m Patient Age:    35 years      BP:           152/63 mmHg Patient Gender: F             HR:           81 bpm. Exam Location:  Inpatient Procedure: 2D Echo Indications:    CHF 428  History:        Patient has prior history of Echocardiogram examinations, most                 recent 10/19/2018. CHF, Previous Myocardial Infarction,                 Pacemaker, Stroke; Risk Factors:Hypertension and Diabetes.  Sonographer:    Jannett Celestine RDCS (AE) Referring Phys: [id number] VASUNDHRA Day  1. Left ventricular ejection fraction, by estimation, is 60 to 65%. The left ventricle has normal function. The left ventricle has no regional  wall motion abnormalities. There is mild left ventricular hypertrophy. Left ventricular diastolic parameters are consistent with Grade I diastolic dysfunction (impaired relaxation).  2. Right ventricular systolic function is normal. The right ventricular size is normal. There is normal pulmonary artery systolic pressure. The estimated right ventricular systolic pressure is 81.8 mmHg.  3. The mitral valve is normal in structure. No evidence of mitral valve regurgitation. No evidence of mitral stenosis.  4. The aortic valve is tricuspid. Aortic valve regurgitation is not visualized. Mild to moderate aortic valve  sclerosis/calcification is present, without any evidence of aortic stenosis.  5. The inferior vena cava is normal in size with greater than 50% respiratory variability, suggesting right atrial pressure of 3 mmHg. FINDINGS  Left Ventricle: Left ventricular ejection fraction, by estimation, is 60 to 65%. The left ventricle has normal function. The left ventricle has no regional wall motion abnormalities. The left ventricular internal cavity size was normal in size. There is  mild left ventricular hypertrophy. Left ventricular diastolic parameters are consistent with Grade I diastolic dysfunction (impaired relaxation). Right Ventricle: The right ventricular size is normal. No increase in right ventricular wall thickness. Right ventricular systolic function is normal. There is normal pulmonary artery systolic pressure. The tricuspid regurgitant velocity is 2.86 m/s, and  with an assumed right atrial pressure of 3 mmHg, the estimated right ventricular systolic pressure is 56.3 mmHg. Left Atrium: Left atrial size was normal in size. Right Atrium: Right atrial size was normal in size. Pericardium: There is no evidence of pericardial effusion. Mitral Valve: The mitral valve is normal in structure. There is mild thickening of the mitral valve leaflet(s). There is mild calcification of the mitral valve leaflet(s). No evidence of mitral valve regurgitation. No evidence of mitral valve stenosis. Tricuspid Valve: The tricuspid valve is normal in structure. Tricuspid valve regurgitation is mild. Aortic Valve: The aortic valve is tricuspid. Aortic valve regurgitation is not visualized. Mild to moderate aortic valve sclerosis/calcification is present, without any evidence of aortic stenosis. Pulmonic Valve: The pulmonic valve was normal in structure. Pulmonic valve regurgitation is not visualized. Aorta: The aortic root is normal in size and structure. Venous: The inferior vena cava is normal in size with greater than 50%  respiratory variability, suggesting right atrial pressure of 3 mmHg. IAS/Shunts: No atrial level shunt detected by color flow Doppler. Additional Comments: A pacer wire is visualized in the right ventricle.  LEFT VENTRICLE PLAX 2D LVIDd:         4.80 cm LVIDs:         3.20 cm LV PW:         1.20 cm LV IVS:        0.90 cm LVOT diam:     1.80 cm LV SV:         50 LV SV Index:   33 LVOT Area:     2.54 cm  LEFT ATRIUM             Index       RIGHT ATRIUM           Index LA diam:        3.60 cm 2.40 cm/m  RA Area:     10.40 cm LA Vol (A2C):   40.9 ml 27.23 ml/m RA Volume:   18.70 ml  12.45 ml/m LA Vol (A4C):   22.6 ml 15.05 ml/m LA Biplane Vol: 30.5 ml 20.30 ml/m  AORTIC VALVE LVOT Vmax:   94.20 cm/s LVOT Vmean:  71.200 cm/s  LVOT VTI:    0.195 m  AORTA Ao Root diam: 2.80 cm MITRAL VALVE               TRICUSPID VALVE MV Area (PHT): 2.84 cm    TR Peak grad:   32.7 mmHg MV Decel Time: 267 msec    TR Vmax:        286.00 cm/s MV E velocity: 68.90 cm/s MV A velocity: 96.30 cm/s  SHUNTS MV E/A ratio:  0.72        Systemic VTI:  0.20 m                            Systemic Diam: 1.80 cm Loralie Champagne MD Electronically signed by Loralie Champagne MD Signature Date/Time: 08/25/2019/6:33:29 PM    Final         Scheduled Meds: . apixaban  2.5 mg Oral BID  . benzonatate  100 mg Oral TID  . busPIRone  15 mg Oral TID  . calcitonin (salmon)  1 spray Alternating Nares Daily  . calcium carbonate  1 tablet Oral BID WC  . cefdinir  300 mg Oral Q12H  . chlorhexidine  15 mL Mouth Rinse BID  . cholecalciferol  2,000 Units Oral Daily  . levothyroxine  75 mcg Oral Q0600  . mouth rinse  15 mL Mouth Rinse q12n4p  . methylPREDNISolone (SOLU-MEDROL) injection  40 mg Intravenous Q8H  . mometasone-formoterol  2 puff Inhalation BID  . multivitamin with minerals  1 tablet Oral Daily  . pantoprazole  40 mg Oral BID  . potassium chloride  20 mEq Oral TID  . pregabalin  25 mg Oral BID  . sertraline  100 mg Oral Daily  . sodium  chloride flush  3 mL Intravenous Q12H   Continuous Infusions: . sodium chloride       LOS: 2 days    Time spent: 30 minutes    Barb Merino, MD [facility] Pager [phone]

## 2019-08-28 DIAGNOSIS — I5033 Acute on chronic diastolic (congestive) heart failure: Secondary | ICD-10-CM

## 2019-08-28 DIAGNOSIS — I4891 Unspecified atrial fibrillation: Secondary | ICD-10-CM

## 2019-08-28 DIAGNOSIS — J9601 Acute respiratory failure with hypoxia: Secondary | ICD-10-CM

## 2019-08-28 LAB — BASIC METABOLIC PANEL
Anion gap: 8 (ref 5–15)
BUN: 33 mg/dL — ABNORMAL HIGH (ref 8–23)
CO2: 29 mmol/L (ref 22–32)
Calcium: 8.3 mg/dL — ABNORMAL LOW (ref 8.9–10.3)
Chloride: 100 mmol/L (ref 98–111)
Creatinine, Ser: 0.71 mg/dL (ref 0.44–1.00)
GFR calc Af Amer: >60 mL/min (ref >60)
GFR calc non Af Amer: >60 mL/min (ref >60)
Glucose, Bld: 212 mg/dL — ABNORMAL HIGH (ref 70–99)
Potassium: 4.7 mmol/L (ref 3.5–5.1)
Sodium: 137 mmol/L (ref 135–145)

## 2019-08-28 LAB — GLUCOSE, CAPILLARY
Glucose-Capillary: 176 mg/dL — ABNORMAL HIGH (ref 70–99)
Glucose-Capillary: 183 mg/dL — ABNORMAL HIGH (ref 70–99)

## 2019-08-28 MED ORDER — PREDNISONE 20 MG PO TABS
20.0000 mg | ORAL_TABLET | Freq: Every day | ORAL | Status: DC
  Administered 2019-08-29: 20 mg via ORAL
  Filled 2019-08-28: qty 1

## 2019-08-28 MED ORDER — ONDANSETRON HCL 4 MG/2ML IJ SOLN
4.0000 mg | Freq: Three times a day (TID) | INTRAMUSCULAR | Status: DC | PRN
  Administered 2019-08-28: 4 mg via INTRAVENOUS
  Filled 2019-08-28: qty 2

## 2019-08-28 NOTE — Progress Notes (Signed)
PROGRESS NOTE    Meredith Gonzalez  QMG:[id number] DOB: 1935/12/10 DOA: 08/26/2019 PCP: Sinclair Ship, MD    Brief Narrative:  Patient is a 84 year old female with history of diastolic dysfunction, chronic bronchitis, coronary artery disease status post PCI, hypertension and hypothyroidism, fibromyalgia, recently suspected interstitial lung disease with progressive hypoxia presented from assisted living facility with ongoing shortness of breath and hypoxia despite treatment with oral Levaquin.  Retrospectively, patient is having dry cough, difficulty eating with odynophagia, progressive shortness of breath and oxygen requirement.  Her initial symptoms has a started many years ago.  Seen by pulmonary as outpatient and work-up was in progress. In the emergency room, 70% on 2 L oxygen.  Placed on 10 L nonrebreather.  WBC count 16.8.  Lactic acid normal.  Potassium was low.  BNP 400.  Troponins flat.  COVID-19 negative.  Chest x-ray showed significant interval progression of bilateral airspace disease consistent with CT scan of the chest done last month.  Admit to the hospital with hypoxia and pneumonia.  Subjective:  Patient looks extremely frail, and for something to drink, I have given her some water via spoon, she did tolerate well with no coughing.   Assessment & Plan:   Principal Problem:   HCAP (healthcare-associated pneumonia) Active Problems:   Chest pain   A-fib (HCC)   Acute respiratory failure with hypoxia (HCC)   CHF exacerbation (HCC)   Respiratory failure, acute ([place])   Palliative care by specialist   Goals of care, counseling/discussion   DNR (do not resuscitate)  Acute on chronic respiratory failure with hypoxia.  Can [place] to ILD exacerbation, and possible pneumonia. -With significant oxygen requirement on admission, requiring NRB, this appears to be improving, she is currently on 6 L oxygen. -She appears to be significantly frail, and deconditioned, with poor inspiratory  effort. -Monitor treated with IV antibiotics initially, this has been adjusted, she is currently on Omnicef. -She is on IV steroids for ILD exacerbation, management per PCCM.  Cough ordered in the office, and has not been completed, recent 6/2 per PCCM. Overall extremely frail, deconditioned, appears to be uncomfortable with her work of breathing and hypoxia, palliative medicine has been consulted to address goals of care, final decision Sunday or Monday to see how she is doing over the weekend with current measures.  Chronic diastolic heart failure: - Chronic diastolic dysfunction with no evidence of exacerbation.  Her crackles on examination are mostly due to #1.  Potassium replaced, intermittent Lasix.  Chronic atrial fibrillation:  - Currently rate controlled.  She is therapeutic on Eliquis.  Dysphagia/odynophagia: Chronic dysphagia odynophagia with cough.  Suspect Barrett's esophagus or motility disorder.  Patient is not clinically stable for upper GI endoscopy. Increased dose of Protonix to 40 mg twice a day. This is probably terminal.  Hypokalemia: Replaced with improvement.  Magnesium is adequate.  Failure to thrive, moderate protein calorie malnutrition: Patient unable to eat effectively.  Will allow mechanical soft diet with thin liquids.  All-time aspiration precautions.  Seen by speech therapy, however patient has increasing oxygen requirement, less likely benefit with any speech therapy at this time.  Goal of care: Reviewed MD met and discussed with patient's son Mr. Rosamond Andress . discussed her progressive debility, poor nutrition and worsening oxygenation.   that this is probably end of her life.  Mr. Kerry Dory wants to give her a few days of trial of therapies and if no adequate improvement, -He prefers her to go back to assisted living facility with hospice -  He is also open to hospice home if that is appropriate for her.   DVT prophylaxis: Eliquis Code Status: DNR/DNI Family  Communication: None at bedside Disposition Plan: Status is: Inpatient  Remains inpatient appropriate because:Inpatient level of care appropriate due to severity of illness   Dispo: The patient is from: ALF              Anticipated d/c is to: ALF              Anticipated d/c date is: 3 days              Patient currently is not medically stable to d/c.   Consultants:   PCCM  Procedures:   None  Antimicrobials:  Anti-infectives (From admission, onward)   Start     Dose/Rate Route Frequency Ordered Stop   08/27/19 1000  cefdinir (OMNICEF) capsule 300 mg     300 mg Oral Every 12 hours 08/27/19 0959 09/01/19 0959   08/26/19 1500  vancomycin (VANCOCIN) IVPB 1000 mg/200 mL premix  Status:  Discontinued     1,000 mg 200 mL/hr over 60 Minutes Intravenous Every 36 hours 08/25/19 0307 08/27/19 1306   08/25/19 1600  ceFEPIme (MAXIPIME) 2 g in sodium chloride 0.9 % 100 mL IVPB  Status:  Discontinued     2 g 200 mL/hr over 30 Minutes Intravenous Every 12 hours 08/25/19 0307 08/27/19 0959   08/25/19 0015  vancomycin (VANCOCIN) IVPB 1000 mg/200 mL premix     1,000 mg 200 mL/hr over 60 Minutes Intravenous  Once 08/25/19 0000 08/25/19 0250   08/25/19 0015  ceFEPIme (MAXIPIME) 2 g in sodium chloride 0.9 % 100 mL IVPB     2 g 200 mL/hr over 30 Minutes Intravenous  Once 08/25/19 0000 08/25/19 0130          Objective: Vitals:   08/28/19 0645 08/28/19 0749 08/28/19 0800 08/28/19 1200  BP:  (!) 145/61 (!) 148/60 (!) 128/59  Pulse: 70 71 72 74  Resp: '14 18 12 17  ' Temp:  97.7 F (36.5 C)  97.7 F (36.5 C)  TempSrc:  Axillary  Oral  SpO2: 92% 94% 92% 96%  Weight:      Height:        Intake/Output Summary (Last 24 hours) at 08/28/2019 1407 Last data filed at 08/28/2019 1118 Gross per 24 hour  Intake 280 ml  Output --  Net 280 ml   Filed Weights   08/27/19 0502 08/28/19 0420  Weight: 50.4 kg 48.8 kg    Examination:  Awake Alert, very frail, deconditioned, appears comfortable no  new F.N deficits, Normal affect Symmetrical Chest wall movement, Good air movement bilaterally, bilateral expiratory crackles RRR,No Gallops,Rubs or new Murmurs, No Parasternal Heave +ve B.Sounds, Abd Soft, No tenderness, No rebound - guarding or rigidity. No Cyanosis, Clubbing or edema, No new Rash or bruise      Data Reviewed: I have personally reviewed following labs and imaging studies  CBC: Recent Labs  Lab 09/18/2019 1906 08/25/19 0100 08/25/19 0352 08/27/19 0626  WBC 16.8*  --  16.2* 14.6*  NEUTROABS 15.1*  --   --  14.0*  HGB 11.0* 11.2* 10.8* 10.3*  HCT 36.5 33.0* 37.8 35.0*  MCV 103.4*  --  111.8* 104.2*  PLT 415*  --  324 820   Basic Metabolic Panel: Recent Labs  Lab 09/21/2019 1906 08/25/19 0100 08/25/19 0352 08/26/19 0452 08/27/19 0626 08/28/19 1209  NA 142 143  --  141 140 137  K 3.2* 3.1*  --  2.6* 3.8 4.7  CL 103  --   --  104 100 100  CO2 26  --   --  '29 29 29  ' GLUCOSE 109*  --   --  108* 177* 212*  BUN 17  --   --  18 24* 33*  CREATININE 0.85  --   --  0.75 0.77 0.71  CALCIUM 8.6*  --   --  7.6* 7.8* 8.3*  MG  --   --  2.0  --  2.4  --    GFR: Estimated Creatinine Clearance: 41.8 mL/min (by C-G formula based on SCr of 0.71 mg/dL). Liver Function Tests: No results for input(s): AST, ALT, ALKPHOS, BILITOT, PROT, ALBUMIN in the last 168 hours. No results for input(s): LIPASE, AMYLASE in the last 168 hours. No results for input(s): AMMONIA in the last 168 hours. Coagulation Profile: No results for input(s): INR, PROTIME in the last 168 hours. Cardiac Enzymes: No results for input(s): CKTOTAL, CKMB, CKMBINDEX, TROPONINI in the last 168 hours. BNP (last 3 results) No results for input(s): PROBNP in the last 8760 hours. HbA1C: No results for input(s): HGBA1C in the last 72 hours. CBG: Recent Labs  Lab 08/28/19 0749 08/28/19 1146  GLUCAP 176* 183*   Lipid Profile: No results for input(s): CHOL, HDL, LDLCALC, TRIG, CHOLHDL, LDLDIRECT in the last  72 hours. Thyroid Function Tests: No results for input(s): TSH, T4TOTAL, FREET4, T3FREE, THYROIDAB in the last 72 hours. Anemia Panel: No results for input(s): VITAMINB12, FOLATE, FERRITIN, TIBC, IRON, RETICCTPCT in the last 72 hours. Sepsis Labs: Recent Labs  Lab 09/05/2019 2359 08/25/19 0352 08/25/19 1746  PROCALCITON  --  1.77 1.43  LATICACIDVEN 1.8  --   --     Recent Results (from the past 240 hour(s))  Blood culture (routine x 2)     Status: None (Preliminary result)   Collection Time: 08/25/19 12:12 AM   Specimen: BLOOD  Result Value Ref Range Status   Specimen Description BLOOD RIGHT ANTECUBITAL  Final   Special Requests   Final    BOTTLES DRAWN AEROBIC AND ANAEROBIC Blood Culture adequate volume   Culture   Final    NO GROWTH 3 DAYS Performed at [facility], [place] [address] [place], [place] [address]    Report Status PENDING  Incomplete  Blood culture (routine x 2)     Status: None (Preliminary result)   Collection Time: 08/25/19 12:12 AM   Specimen: BLOOD RIGHT FOREARM  Result Value Ref Range Status   Specimen Description BLOOD RIGHT FOREARM  Final   Special Requests   Final    BOTTLES DRAWN AEROBIC AND ANAEROBIC Blood Culture adequate volume   Culture   Final    NO GROWTH 3 DAYS Performed at [facility], [place] [address] [place], [place] [address]    Report Status PENDING  Incomplete  SARS Coronavirus 2 by RT PCR (hospital order, performed in [facility] hospital lab) Nasopharyngeal Nasopharyngeal Swab     Status: None   Collection Time: 08/25/19 12:23 AM   Specimen: Nasopharyngeal Swab  Result Value Ref Range Status   SARS Coronavirus 2 NEGATIVE NEGATIVE Final    Comment: (NOTE) SARS-CoV-2 target nucleic acids are NOT DETECTED. The SARS-CoV-2 RNA is generally detectable in upper and lower respiratory specimens during the acute phase of infection. The lowest concentration of SARS-CoV-2 viral copies this assay can detect is 250 copies / mL. A  negative result does  not preclude SARS-CoV-2 infection and should not be used as the sole basis for treatment or other patient management decisions.  A negative result may occur with improper specimen collection / handling, submission of specimen other than nasopharyngeal swab, presence of viral mutation(s) within the areas targeted by this assay, and inadequate number of viral copies (<250 copies / mL). A negative result must be combined with clinical observations, patient history, and epidemiological information. Fact Sheet for Patients:   StrictlyIdeas.no Fact Sheet for Healthcare Providers: BankingDealers.co.za This test is not yet approved or cleared  by the [place] FDA and has been authorized for detection and/or diagnosis of SARS-CoV-2 by FDA under an Emergency Use Authorization (EUA).  This EUA will remain in effect (meaning this test can be used) for the duration of the COVID-19 declaration under Section 564(b)(1) of the Act, 21 U.S.C. section 360bbb-3(b)(1), unless the authorization is terminated or revoked sooner. Performed at [facility], [place] [address], [place], [place] [address]   Urine culture     Status: Abnormal   Collection Time: 08/25/19  3:27 AM   Specimen: Urine, Random  Result Value Ref Range Status   Specimen Description URINE, RANDOM  Final   Special Requests NONE  Final   Culture (A)  Final    <10,000 COLONIES/mL INSIGNIFICANT GROWTH Performed at [facility], [place] [address] [place], [place] [address]    Report Status 08/25/2019 FINAL  Final  MRSA PCR Screening     Status: None   Collection Time: 08/26/19 11:54 AM   Specimen: Nasal Mucosa; Nasopharyngeal  Result Value Ref Range Status   MRSA by PCR NEGATIVE NEGATIVE Final    Comment:        The GeneXpert MRSA Assay (FDA approved for NASAL specimens only), is one component of a comprehensive MRSA colonization surveillance program. It  is not intended to diagnose MRSA infection nor to guide or monitor treatment for MRSA infections. Performed at [facility], [place] [address] [place], [place] [address]          Radiology Studies: No results found.      Scheduled Meds: . apixaban  2.5 mg Oral BID  . benzonatate  100 mg Oral TID  . busPIRone  15 mg Oral TID  . calcitonin (salmon)  1 spray Alternating Nares Daily  . calcium carbonate  1 tablet Oral BID WC  . cefdinir  300 mg Oral Q12H  . chlorhexidine  15 mL Mouth Rinse BID  . cholecalciferol  2,000 Units Oral Daily  . levothyroxine  75 mcg Oral Q0600  . mouth rinse  15 mL Mouth Rinse q12n4p  . methylPREDNISolone (SOLU-MEDROL) injection  40 mg Intravenous Q8H  . mometasone-formoterol  2 puff Inhalation BID  . multivitamin with minerals  1 tablet Oral Daily  . pantoprazole  40 mg Oral BID  . pregabalin  25 mg Oral BID  . sertraline  100 mg Oral Daily  . sodium chloride flush  3 mL Intravenous Q12H   Continuous Infusions: . sodium chloride       LOS: 3 days      Phillips Climes, MD [facility]

## 2019-08-28 NOTE — Progress Notes (Signed)
   Palliative Medicine Inpatient Follow Up Note   Reason for consult:  Goals of care in the setting of interstitial lung disease  HPI:  Per intake H&P --> 84 year old with history of persistent atrial fibrillation on amiodarone, diastolic heart failure, hypothyroidism, CVA coronary artery disease, ongoingevaluationinpulmonary clinic for interstitial lung disease. 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 Presenting with worsening hypoxic respiratory failure, chest x-ray with worsening bilateral infiltrates. She had been treated with Levaquin at her nursing facility with no improvement.  Palliative care was asked to help with goals of care conversations.  Today's Discussion (08/28/2019): Chart reviewed. Met with Benjamine Mola at bedside this morning. She shared with me that she had a difficult night in the setting of "breathlessness". I asked her if the prescribed morphine helped which she stated did provide some relief. She said that her mouth was dry and requested a popsicle which was provided to her.   Plan to meet with Lennette Bihari and Benjamine Mola tomorrow to aid in ongoing goals of care conversations.   Discussed the importance of continued conversation with family and their  medical providers regarding overall plan of care and treatment options, ensuring decisions are within the context of the patients values and GOCs.  Decision Maker: Patient can make decisions for herself.  SUMMARY OF RECOMMENDATIONS DNAR/DNI  Continue current measures throughout the weekend, plan to meet tomorrow with patient and her son for reassessment of current goals  Chaplain consult to provide ongoing support  Time Spent: 15 Greater than 50% of the time was spent in counseling and coordination of care ______________________________________________________________________________________ [place] Team Team Cell Phone: [phone] Please utilize secure  chat with additional questions, if there is no response within 30 minutes please call the above phone number  Palliative Medicine Team providers are available by phone from 7am to 7pm daily and can be reached through the team cell phone.  Should this patient require assistance outside of these hours, please call the patient's attending physician.

## 2019-08-28 NOTE — Progress Notes (Signed)
Discussed with patient about getting out of bed to the recliner chair today. Patient politely declined. Incentive spirometer at bedside and teaching given. Will continue to monitor.  Hiram Comber, RN 08/28/2019 11:09 AM

## 2019-08-28 NOTE — Progress Notes (Addendum)
NAME:  Meredith Gonzalez, MRN:  [id number], DOB:  1935/09/13, LOS: 3 ADMISSION DATE:  09/20/2019, CONSULTATION DATE: 08/25/2019 REFERRING MD: Dr. Sloan Leiter, CHIEF COMPLAINT: Dyspnea, management of interstitial lung disease  Brief History   84 year old with history of persistent atrial fibrillation on amiodarone, diastolic heart failure, hypothyroidism, CVA coronary artery disease, ongoing evaluation in pulmonary clinic for interstitial lung disease.  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  Presenting with worsening hypoxic respiratory failure, chest x-ray with worsening bilateral infiltrates.  She had been treated with Levaquin at her nursing facility with no improvement. PCCM consulted for help with management  Past Medical History   Past Medical History:  Diagnosis Date  . Anemia   . Anxiety   . Arthritis    "all over" (02/11/2018)  . Basal cell carcinoma    "left leg" (02/11/2018)  . Cervical spine fracture ([place]) 12/2017   "C1-2"  . CHF (congestive heart failure) ([place])   . Chronic bronchitis ([place])   . Chronic neck pain    "since I broke my neck 6-8 wk ago" (02/11/2018)  . Diabetic peripheral neuropathy ([place])   . Fibromyalgia   . Fracture of right humerus   . Generalized weakness   . Headache    "weekly" (02/11/2018)  . History of blood transfusion    "related to one of my femur surgeries" (02/11/2018)  . History of echocardiogram    Echo 8/18: EF 50-55, no RWMA, Gr 1 DD, calcified AV leaflets, MAC, trivial MR, mod LAE, PASP 37  . Hypertension   . Hypothyroidism   . Incontinence of urine   . Ischemic stroke ([place]) 2000   "lost part of the vision in my right eye" (02/11/2018)  . Major depression, chronic   . Myocardial infarction ([place]) 06/2016  . Pacemaker   . Recurrent falls   . Syncope and collapse   . Type 2 diabetes, diet controlled ([place])     Significant Hospital Events   6/2-admit  Consults:  pccm  Procedures:  None  Significant Diagnostic Tests:    High Resolution CT Chest 5/06>  : Alternate pattern. Peribronchial and subpleural ground glass opacity . Traction bronchiectasis. No honeycombing.   Chest x-ray 09/06/2019-worsening bilateral pulmonary infiltrates.  I have reviewed the images personally  Micro Data:  Blood culture 08/25/2019-negative  Antimicrobials:  Cefepime 6/2-6/4 Vancomycin 6/2-6/4 omnicef 6/4>>  Interim history/subjective:  Appears more comfortable today on oxygen supplementation  oxygen supplementation is better   Objective   Blood pressure (!) 128/59, pulse 74, temperature 97.7 F (36.5 C), temperature source Oral, resp. rate 17, height _0  (1.626 m), weight 48.8 kg, SpO2 96 %.        Intake/Output Summary (Last 24 hours) at 08/28/2019 1544 Last data filed at 08/28/2019 1118 Gross per 24 hour  Intake 280 ml  Output --  Net 280 ml   Filed Weights   08/27/19 0502 08/28/19 0420  Weight: 50.4 kg 48.8 kg    Examination: General: Elderly, frail, denies significant shortness of breath HENT: Moist oral mucosa Lungs: Bibasal rales Cardiovascular: S1-S2 appreciated Abdomen: Bowel sounds appreciated  Chest x-ray reviewed by myself 6/1 Recent CT scan of the chest reviewed 5/6  Resolved Hospital Problem list     Assessment & Plan:  Acute hypoxemic respiratory failure Baseline interstitial lung disease with alternate pattern on CT scan suggestive of hypersensitivity pneumonitis though she does not have known exposures  ILD work-up was ordered in the office but was not completed, was re-sent 6/2  Continue  antibiotic coverage-currently on Omnicef  Echocardiogram-normal ejection fraction, normal right-sided pressures  Follow swallow evaluation  Started on steroids -She had to be placed on a nonrebreather 6/3-she appears improved I will transition to oral steroids -We will receive last dose of Solu-Medrol tonight and start on prednisone 20 mg daily on 6/6 Prednisone-20 mg for 7 days then 10 mg for  7 days  If no improvement or worsening then agreeable to hospice Palliative care already involved  We will sign off, call as needed   Sherrilyn Rist, MD [facility] Pager: [phone]

## 2019-08-28 NOTE — Progress Notes (Signed)
Patient ate one bite of french toast (which she ended up spitting out) and a bite of sausage for breakfast. Refused anything further.

## 2019-08-29 DIAGNOSIS — Z515 Encounter for palliative care: Secondary | ICD-10-CM

## 2019-08-29 DIAGNOSIS — J849 Interstitial pulmonary disease, unspecified: Secondary | ICD-10-CM

## 2019-08-29 LAB — BASIC METABOLIC PANEL
Anion gap: 10 (ref 5–15)
BUN: 36 mg/dL — ABNORMAL HIGH (ref 8–23)
CO2: 32 mmol/L (ref 22–32)
Calcium: 8.7 mg/dL — ABNORMAL LOW (ref 8.9–10.3)
Chloride: 97 mmol/L — ABNORMAL LOW (ref 98–111)
Creatinine, Ser: 0.7 mg/dL (ref 0.44–1.00)
GFR calc Af Amer: >60 mL/min (ref >60)
GFR calc non Af Amer: >60 mL/min (ref >60)
Glucose, Bld: 134 mg/dL — ABNORMAL HIGH (ref 70–99)
Potassium: 5 mmol/L (ref 3.5–5.1)
Sodium: 139 mmol/L (ref 135–145)

## 2019-08-29 MED ORDER — MORPHINE SULFATE (PF) 2 MG/ML IV SOLN
1.0000 mg | INTRAVENOUS | Status: AC | PRN
  Administered 2019-08-30 (×3): 2 mg via INTRAVENOUS
  Filled 2019-08-29 (×3): qty 1

## 2019-08-29 MED ORDER — BISACODYL 10 MG RE SUPP: 10.0000 mg | Freq: Every day | RECTAL | Status: DC | PRN

## 2019-08-29 MED ORDER — GLYCOPYRROLATE 0.2 MG/ML IJ SOLN
0.4000 mg | INTRAMUSCULAR | Status: DC
  Administered 2019-08-29 – 2019-08-31 (×14): 0.4 mg via INTRAVENOUS
  Filled 2019-08-29 (×14): qty 2

## 2019-08-29 MED ORDER — BIOTENE DRY MOUTH MT LIQD: 15.0000 mL | OROMUCOSAL | Status: DC | PRN

## 2019-08-29 MED ORDER — MORPHINE SULFATE 10 MG/5ML PO SOLN: 2.5000 mg | ORAL | Status: DC | PRN

## 2019-08-29 MED ORDER — LORAZEPAM 2 MG/ML IJ SOLN
0.5000 mg | INTRAMUSCULAR | Status: DC | PRN
  Administered 2019-08-30: 1 mg via INTRAVENOUS
  Filled 2019-08-29: qty 1

## 2019-08-29 MED ORDER — POLYVINYL ALCOHOL 1.4 % OP SOLN
1.0000 [drp] | Freq: Four times a day (QID) | OPHTHALMIC | Status: DC | PRN
  Filled 2019-08-29: qty 15

## 2019-08-29 NOTE — Progress Notes (Signed)
PROGRESS NOTE    Meredith Gonzalez  DUK:[id number] DOB: 1935-09-25 DOA: 08/30/2019 PCP: Sinclair Ship, MD    Brief Narrative:  Patient is a 84 year old female with history of diastolic dysfunction, chronic bronchitis, coronary artery disease status post PCI, hypertension and hypothyroidism, fibromyalgia, recently suspected interstitial lung disease with progressive hypoxia presented from assisted living facility with ongoing shortness of breath and hypoxia despite treatment with oral Levaquin.  Retrospectively, patient is having dry cough, difficulty eating with odynophagia, progressive shortness of breath and oxygen requirement.  Her initial symptoms has a started many years ago.  Seen by pulmonary as outpatient and work-up was in progress. In the emergency room, 70% on 2 L oxygen.  Placed on 10 L nonrebreather.  WBC count 16.8.  Lactic acid normal.  Potassium was low.  BNP 400.  Troponins flat.  COVID-19 negative.  Chest x-ray showed significant interval progression of bilateral airspace disease consistent with CT scan of the chest done last month.  Admit to the hospital with hypoxia and pneumonia.  Subjective:  Patient looks extremely frail, overnight she had significant increase of her oxygen requirement, she was unable to tolerate any oral intake about dinnertime as she has been having consistently cough with that   Assessment & Plan:   Principal Problem:   HCAP (healthcare-associated pneumonia) Active Problems:   Chest pain   A-fib (HCC)   Acute respiratory failure with hypoxia (HCC)   CHF exacerbation ([place])   Respiratory failure, acute ([place])   Palliative care by specialist   Goals of care, counseling/discussion   DNR (do not resuscitate)  Acute on chronic respiratory failure with hypoxia. Secondary to ILD exacerbation, and possible pneumonia. -With significant oxygen requirement on admission, she is having significantly fluctuating oxygen requirement during hospital stay, but overall  requiring 10 L via high flow nasal cannula, and NRB intermittently for some symptomatic dyspnea . -She appears to be significantly frail, and deconditioned, with poor inspiratory effort. -Monitor treated with IV antibiotics initially, this has been adjusted, narrowed to [facility]. -She is on IV steroids for ILD exacerbation, management per PCCM.   - Overall extremely frail, deconditioned, appears to be uncomfortable with her work of breathing and hypoxia, palliative medicine has been consulted to address goals of care, she had no improvement despite maximal medical management, and given the amount of discomfort and dyspnea, patient and family decided to pursue full comfort measures, palliative input and assessment and addressing goals of care and symptom management greatly appreciated.  Chronic diastolic heart failure: - Chronic diastolic dysfunction with no evidence of exacerbation.  Her crackles on examination are mostly due to #1.  Potassium replaced, intermittent Lasix.  Chronic atrial fibrillation:  - Currently rate controlled.  She is therapeutic on Eliquis.  Dysphagia/odynophagia: Chronic dysphagia odynophagia with cough.  Suspect Barrett's esophagus or motility disorder.  Patient is not clinically stable for upper GI endoscopy. Increased dose of Protonix to 40 mg twice a day. This is terminal.  Hypokalemia: Replaced with improvement.  Magnesium is adequate.  Failure to thrive, moderate protein calorie malnutrition:  -She is currently comfort care  Goal of care: -Palliative consult greatly appreciated, patient has been transitioned to comfort measures.   DVT prophylaxis: Eliquis Code Status: DNR/DNI Family Communication: None at bedside Disposition Plan: Status is: Inpatient  Remains inpatient appropriate because:Inpatient level of care appropriate due to severity of illness   Dispo: The patient is from: ALF              Anticipated d/c is to:  Hospice house versus hospital  death              Anticipated d/c date is: 3 days              Patient currently is not medically stable to d/c. Is extremely frail, and very likely should have a hospital death.   Consultants:   PCCM  Palliative  Procedures:   None  Antimicrobials:  Anti-infectives (From admission, onward)   Start     Dose/Rate Route Frequency Ordered Stop   08/27/19 1000  cefdinir (OMNICEF) capsule 300 mg  Status:  Discontinued     300 mg Oral Every 12 hours 08/27/19 0959 08/29/19 1224   08/26/19 1500  vancomycin (VANCOCIN) IVPB 1000 mg/200 mL premix  Status:  Discontinued     1,000 mg 200 mL/hr over 60 Minutes Intravenous Every 36 hours 08/25/19 0307 08/27/19 1306   08/25/19 1600  ceFEPIme (MAXIPIME) 2 g in sodium chloride 0.9 % 100 mL IVPB  Status:  Discontinued     2 g 200 mL/hr over 30 Minutes Intravenous Every 12 hours 08/25/19 0307 08/27/19 0959   08/25/19 0015  vancomycin (VANCOCIN) IVPB 1000 mg/200 mL premix     1,000 mg 200 mL/hr over 60 Minutes Intravenous  Once 08/25/19 0000 08/25/19 0250   08/25/19 0015  ceFEPIme (MAXIPIME) 2 g in sodium chloride 0.9 % 100 mL IVPB     2 g 200 mL/hr over 30 Minutes Intravenous  Once 08/25/19 0000 08/25/19 0130          Objective: Vitals:   08/29/19 0400 08/29/19 0450 08/29/19 0808 08/29/19 0823  BP:   (!) 151/61   Pulse:   73 74  Resp:   16 17  Temp: 97.7 F (36.5 C)  97.9 F (36.6 C)   TempSrc: Axillary  Oral   SpO2:   97% 93%  Weight:  48.8 kg    Height:        Intake/Output Summary (Last 24 hours) at 08/29/2019 1228 Last data filed at 08/29/2019 0823 Gross per 24 hour  Intake 115 ml  Output --  Net 115 ml   Filed Weights   08/27/19 0502 08/28/19 0420 08/29/19 0450  Weight: 50.4 kg 48.8 kg 48.8 kg    Examination:  Awake , very frail, deconditioned, appears comfortable no new F.N deficits Diminished air entry bilaterally, bilateral expiratory crackles Heart rate and rhythm, no rubs, murmurs or gallops Abdomen soft,  nontender Extremities with no edema, clubbing or cyanosis    Data Reviewed: I have personally reviewed following labs and imaging studies  CBC: Recent Labs  Lab 09/04/2019 1906 08/25/19 0100 08/25/19 0352 08/27/19 0626  WBC 16.8*  --  16.2* 14.6*  NEUTROABS 15.1*  --   --  14.0*  HGB 11.0* 11.2* 10.8* 10.3*  HCT 36.5 33.0* 37.8 35.0*  MCV 103.4*  --  111.8* 104.2*  PLT 415*  --  324 846   Basic Metabolic Panel: Recent Labs  Lab 09/07/2019 1906 08/28/2019 1906 08/25/19 0100 08/25/19 0352 08/26/19 0452 08/27/19 0626 08/28/19 1209 08/29/19 0649  NA 142   < > 143  --  141 140 137 139  K 3.2*   < > 3.1*  --  2.6* 3.8 4.7 5.0  CL 103  --   --   --  104 100 100 97*  CO2 26  --   --   --  29 29 29  32  GLUCOSE 109*  --   --   --  108* 177* 212* 134*  BUN 17  --   --   --  18 24* 33* 36*  CREATININE 0.85  --   --   --  0.75 0.77 0.71 0.70  CALCIUM 8.6*  --   --   --  7.6* 7.8* 8.3* 8.7*  MG  --   --   --  2.0  --  2.4  --   --    < > = values in this interval not displayed.   GFR: Estimated Creatinine Clearance: 41.8 mL/min (by C-G formula based on SCr of 0.7 mg/dL). Liver Function Tests: No results for input(s): AST, ALT, ALKPHOS, BILITOT, PROT, ALBUMIN in the last 168 hours. No results for input(s): LIPASE, AMYLASE in the last 168 hours. No results for input(s): AMMONIA in the last 168 hours. Coagulation Profile: No results for input(s): INR, PROTIME in the last 168 hours. Cardiac Enzymes: No results for input(s): CKTOTAL, CKMB, CKMBINDEX, TROPONINI in the last 168 hours. BNP (last 3 results) No results for input(s): PROBNP in the last 8760 hours. HbA1C: No results for input(s): HGBA1C in the last 72 hours. CBG: Recent Labs  Lab 08/28/19 0749 08/28/19 1146  GLUCAP 176* 183*   Lipid Profile: No results for input(s): CHOL, HDL, LDLCALC, TRIG, CHOLHDL, LDLDIRECT in the last 72 hours. Thyroid Function Tests: No results for input(s): TSH, T4TOTAL, FREET4, T3FREE,  THYROIDAB in the last 72 hours. Anemia Panel: No results for input(s): VITAMINB12, FOLATE, FERRITIN, TIBC, IRON, RETICCTPCT in the last 72 hours. Sepsis Labs: Recent Labs  Lab 09/18/2019 2359 08/25/19 0352 08/25/19 1746  PROCALCITON  --  1.77 1.43  LATICACIDVEN 1.8  --   --     Recent Results (from the past 240 hour(s))  Blood culture (routine x 2)     Status: None (Preliminary result)   Collection Time: 08/25/19 12:12 AM   Specimen: BLOOD  Result Value Ref Range Status   Specimen Description BLOOD RIGHT ANTECUBITAL  Final   Special Requests   Final    BOTTLES DRAWN AEROBIC AND ANAEROBIC Blood Culture adequate volume   Culture   Final    NO GROWTH 3 DAYS Performed at [facility], [place] [address], [place], [place] [address]    Report Status PENDING  Incomplete  Blood culture (routine x 2)     Status: None (Preliminary result)   Collection Time: 08/25/19 12:12 AM   Specimen: BLOOD RIGHT FOREARM  Result Value Ref Range Status   Specimen Description BLOOD RIGHT FOREARM  Final   Special Requests   Final    BOTTLES DRAWN AEROBIC AND ANAEROBIC Blood Culture adequate volume   Culture   Final    NO GROWTH 3 DAYS Performed at [facility], [place] [address] [place], [place] [address]    Report Status PENDING  Incomplete  SARS Coronavirus 2 by RT PCR (hospital order, performed in [facility] hospital lab) Nasopharyngeal Nasopharyngeal Swab     Status: None   Collection Time: 08/25/19 12:23 AM   Specimen: Nasopharyngeal Swab  Result Value Ref Range Status   SARS Coronavirus 2 NEGATIVE NEGATIVE Final    Comment: (NOTE) SARS-CoV-2 target nucleic acids are NOT DETECTED. The SARS-CoV-2 RNA is generally detectable in upper and lower respiratory specimens during the acute phase of infection. The lowest concentration of SARS-CoV-2 viral copies this assay can detect is 250 copies / mL. A negative result does not preclude SARS-CoV-2 infection and should not be used as the sole  basis for treatment or other patient management decisions.  A negative result may occur with improper specimen collection / handling, submission of specimen other than nasopharyngeal swab, presence of viral mutation(s) within the areas targeted by this assay, and inadequate number of viral copies (<250 copies / mL). A negative result must be combined with clinical observations, patient history, and epidemiological information. Fact Sheet for Patients:   StrictlyIdeas.no Fact Sheet for Healthcare Providers: BankingDealers.co.za This test is not yet approved or cleared  by the [place] FDA and has been authorized for detection and/or diagnosis of SARS-CoV-2 by FDA under an Emergency Use Authorization (EUA).  This EUA will remain in effect (meaning this test can be used) for the duration of the COVID-19 declaration under Section 564(b)(1) of the Act, 21 U.S.C. section 360bbb-3(b)(1), unless the authorization is terminated or revoked sooner. Performed at [facility], [place] [address], [place], [place] [address]   Urine culture     Status: Abnormal   Collection Time: 08/25/19  3:27 AM   Specimen: Urine, Random  Result Value Ref Range Status   Specimen Description URINE, RANDOM  Final   Special Requests NONE  Final   Culture (A)  Final    <10,000 COLONIES/mL INSIGNIFICANT GROWTH Performed at [facility], [place] [address] [place], [place] [address]    Report Status 08/25/2019 FINAL  Final  MRSA PCR Screening     Status: None   Collection Time: 08/26/19 11:54 AM   Specimen: Nasal Mucosa; Nasopharyngeal  Result Value Ref Range Status   MRSA by PCR NEGATIVE NEGATIVE Final    Comment:        The GeneXpert MRSA Assay (FDA approved for NASAL specimens only), is one component of a comprehensive MRSA colonization surveillance program. It is not intended to diagnose MRSA infection nor to guide or monitor treatment for MRSA  infections. Performed at [facility], [place] [address], [place], [place] [address]          Radiology Studies: No results found.      Scheduled Meds: . benzonatate  100 mg Oral TID  . chlorhexidine  15 mL Mouth Rinse BID  . glycopyrrolate  0.4 mg Intravenous Q4H  . mouth rinse  15 mL Mouth Rinse q12n4p  . mometasone-formoterol  2 puff Inhalation BID   Continuous Infusions:    LOS: 4 days      Phillips Climes, MD [facility]

## 2019-08-29 NOTE — Care Management (Signed)
Consult from palliative for transition to [facility] place. Notified liaison on call. Currently there are no beds at [facility].

## 2019-08-29 NOTE — Progress Notes (Signed)
Palliative Medicine Inpatient Follow Up Note   Reason for consult:  Goals of care in the setting of interstitial lung disease  HPI:  Per intake H&P --> 84 year old with history of persistent atrial fibrillation on amiodarone, diastolic heart failure, hypothyroidism, CVA coronary artery disease, ongoingevaluationinpulmonary clinic for interstitial lung disease. 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 Presenting with worsening hypoxic respiratory failure, chest x-ray with worsening bilateral infiltrates. She had been treated with Levaquin at her nursing facility with no improvement.  Palliative care was asked to help with goals of care conversations.  Today's Discussion (08/28/2019): Chart reviewed. Met with Benjamine Mola at bedside this morning. She shared with me that she had a difficult night in the setting of "breathlessness". I asked her if the prescribed morphine helped which she stated did provide some relief. She said that her mouth was dry and requested a popsicle which was provided to her.   Plan to meet with Lennette Bihari and Benjamine Mola tomorrow to aid in ongoing goals of care conversations.   Discussed the importance of continued conversation with family and their  medical providers regarding overall plan of care and treatment options, ensuring decisions are within the context of the patients values and GOCs.  Addendum: I was able to meet with Lennette Bihari and his granddaughter this afternoon. We discussed Evalyn's present situation. At the present time we reviewed her ILD and lack of improvement despite antibiotics, steroids, nebulizer's, and supplemental oxygen over the past four days. We discussed options at this point which would be to remain at the present level of care or transition our focus to comfort.   Khloei's family shared that it would be best to be very direct about the present scenario and prognosis. I was able to do this with Benjamine Mola. She vocalized  understanding. She and her family have opted to pursue comfort measures in house.   We talked about transition to comfort measures in house and what that would entail inclusive of medications to control pain, dyspnea, agitation, nausea, itching, and hiccups.  We discussed stopping all uneccessary measures such as blood draws, needle sticks, and frequent vital signs. Utilized reflective listening throughout our time together.   Decision Maker: Patient can make decisions for herself.  SUMMARY OF RECOMMENDATIONS DNAR/DNI  Transition to comfort measures  TOC --> Referral for [facility] residential [facility]  Chaplain consult to provide ongoing support  Symptom Management: Dyspnea: Pain:  - Morphine 2.20m PO/SL Q1H PRN  - Morphine 1-241mIV Q1H PRN Fever:  - Tylenol 65041mO/PR Q6H PRN Agitation: Anxiety:  - Lorazepam 1mg15m/SL/IV  Q1H PRN Nausea:  - Zofran 4mg 2mIV Q6H PRN  Secretions:  - Glycopyrrolate 0.4mg I61m4H ATC Dry Eyes:  - Artificial Tears PRN Xerostomia:  - Biotene 15ml P38m- BID oral care Urinary Retention:  - Bladder scan if > 250ml pl64mand maintain foley  Constipation:  - Bisacodyl 10mg PR 55mQDay Spiritual:  - Chaplain consult  Time Spent: 60 Greater than 50% of the time was spent in counseling and coordination of care ______________________________________________________________________________________ Juandiego Kolenovic [place] Cell Phone: [phone] secure chat with additional questions, if there is no response within 30 minutes please call the above phone number  Palliative Medicine Team providers are available by phone from 7am to 7pm daily and can be reached through the team cell phone.  Should this patient require assistance outside of these hours, please call the patient's attending physician.

## 2019-08-29 NOTE — Progress Notes (Signed)
Manufacturing engineer [facility]) hospital Liaison note  Received request from [facility] for family interest in [facility]. [facility] is unable to offer a room today. Hospital Liaison will follow up tomorrow or sooner if a room becomes available.    This RN made visit to pt's bedside but son was unable to talk at that time.  Hospital liaison team to follow up tomorrow.   Please do not hesitate to call with questions.   Thank you,    Domenic Moras, BSN, [place] (in [place]) [phone]

## 2019-08-30 DIAGNOSIS — R0600 Dyspnea, unspecified: Secondary | ICD-10-CM

## 2019-08-30 DIAGNOSIS — L299 Pruritus, unspecified: Secondary | ICD-10-CM

## 2019-08-30 DIAGNOSIS — F411 Generalized anxiety disorder: Secondary | ICD-10-CM

## 2019-08-30 LAB — CULTURE, BLOOD (ROUTINE X 2)
Culture: NO GROWTH
Culture: NO GROWTH
Special Requests: ADEQUATE
Special Requests: ADEQUATE

## 2019-08-30 LAB — HYPERSENSITIVITY PNEUMONITIS
A. Pullulans Abs: NEGATIVE
A.Fumigatus #1 Abs: NEGATIVE
Micropolyspora faeni, IgG: NEGATIVE
Pigeon Serum Abs: NEGATIVE
Thermoact. Saccharii: NEGATIVE
Thermoactinomyces vulgaris, IgG: NEGATIVE

## 2019-08-30 MED ORDER — HALOPERIDOL LACTATE 5 MG/ML IJ SOLN
2.0000 mg | Freq: Four times a day (QID) | INTRAMUSCULAR | Status: DC | PRN
  Administered 2019-08-31: 2 mg via INTRAVENOUS
  Filled 2019-08-30: qty 1

## 2019-08-30 MED ORDER — WHITE PETROLATUM EX OINT
TOPICAL_OINTMENT | CUTANEOUS | Status: AC
  Filled 2019-08-30: qty 28.35

## 2019-08-30 MED ORDER — MORPHINE 100MG IN NS 100ML (1MG/ML) PREMIX INFUSION
0.5000 mg/h | INTRAVENOUS | Status: DC
  Administered 2019-08-30: 0.5 mg/h via INTRAVENOUS
  Administered 2019-08-31 (×3): 1 mg/h via INTRAVENOUS
  Filled 2019-08-30: qty 100

## 2019-08-30 MED ORDER — MORPHINE BOLUS VIA INFUSION
1.0000 mg | INTRAVENOUS | Status: DC | PRN
  Filled 2019-08-30: qty 1

## 2019-08-30 MED ORDER — LORAZEPAM 2 MG/ML IJ SOLN
0.5000 mg | INTRAMUSCULAR | Status: DC | PRN
  Administered 2019-08-31: 1 mg via INTRAVENOUS
  Filled 2019-08-30: qty 1

## 2019-08-30 MED ORDER — DIPHENHYDRAMINE HCL 50 MG/ML IJ SOLN
12.5000 mg | Freq: Four times a day (QID) | INTRAMUSCULAR | Status: DC
  Administered 2019-08-30 – 2019-08-31 (×4): 12.5 mg via INTRAVENOUS
  Filled 2019-08-30 (×4): qty 1

## 2019-08-30 NOTE — NC FL2 (Signed)
[place] LEVEL OF CARE SCREENING TOOL     IDENTIFICATION  Patient Name: Meredith Gonzalez Birthdate: 1935-10-06 Sex: female Admission Date (Current Location): 09/12/2019  [facility] and [place] Number:  Herbalist and Address:  [facility] [place], [place] [address], [place], [place] [address]      Provider Number: [id number]  Attending Physician Name and Address:  Albertine Patricia, MD  Relative Name and Phone Number:  Lennette Bihari, son, [phone]    Current Level of Care: Hospital Recommended Level of Care: [place] Prior Approval Number:    Date Approved/Denied:   PASRR Number:    Discharge Plan: Other (Comment)(ALF)    Current Diagnoses: Patient Active Problem List   Diagnosis Date Noted  . Terminal care   . Palliative care by specialist   . Goals of care, counseling/discussion   . DNR (do not resuscitate)   . HCAP (healthcare-associated pneumonia) 08/25/2019  . Acute respiratory failure with hypoxia ([place]) 08/25/2019  . CHF exacerbation ([place]) 08/25/2019  . Respiratory failure, acute ([place]) 08/25/2019  . Failure to thrive in adult   . Loss of weight   . Vomiting 01/02/2019  . Closed fracture of left superior pubic ramus, initial encounter 10/18/2018  . Dehydration 08/18/2018  . Dizziness 07/09/2018  . Stroke due to embolism of carotid artery ([place]) 06/05/2018  . Stroke-like symptom   . Dyspnea   . Acute blood loss anemia   . Acute ischemic stroke ([place]) 05/31/2018  . Stroke ([place]) 05/30/2018  . Chronic diastolic congestive heart failure ([place])   . Anemia of chronic disease   . Hypoalbuminemia due to protein-calorie malnutrition ([place])   . Labile blood pressure   . A-fib ([place])   . Chronic neck pain   . Subcortical infarction ([place]) 04/27/2018  . CVA (cerebral vascular accident) ([place]) 04/26/2018  . TIA (transient ischemic attack) 04/24/2018  . CAD (coronary artery disease) 08/07/2016  . (HFpEF) heart failure with  preserved ejection fraction ([place]) 08/07/2016  . Ischemic cardiomyopathy 08/07/2016  . Chest pain   . NSTEMI (non-ST elevated myocardial infarction) ([place])   . Status post coronary artery stent placement   . Abnormal stress test   . SOB (shortness of breath)   . Elevated troponin 07/13/2016  . Leukocytosis   . Pacemaker 05/17/2014  . Complications, pacemaker cardiac, mechanical 12/31/2012  . Edema extremities 10/08/2012  . Persistent atrial fibrillation ([place]) 10/08/2012  . Hypertension 10/08/2012  . Hypothyroidism 10/08/2012  . Neuropathy 10/08/2012  . Diabetes ([place]) 10/08/2012  . Depression 09/08/2012  . Anemia 08/08/2012  . Knee fracture, left 08/07/2012  . Recurrent falls 08/07/2012  . Syncope 08/07/2012  . SDH (subdural hematoma) (HCC) 08/07/2012    Orientation RESPIRATION BLADDER Height & Weight     Self, Situation  O2(6L O2) Incontinent, External catheter Weight: 107 lb 9.4 oz (48.8 kg) Height:  5\' 4"  (162.6 cm)  BEHAVIORAL SYMPTOMS/MOOD NEUROLOGICAL BOWEL NUTRITION STATUS      Continent Diet(Regular; thin liquids)  AMBULATORY STATUS COMMUNICATION OF NEEDS Skin   Extensive Assist Verbally Normal                       Personal Care Assistance Level of Assistance  Bathing, Dressing, Feeding Bathing Assistance: Maximum assistance Feeding assistance: Maximum assistance Dressing Assistance: Maximum assistance     Functional Limitations Info  Sight, Hearing, Speech Sight Info: Adequate Hearing Info: Impaired Speech Info: Adequate    SPECIAL CARE FACTORS FREQUENCY  Contractures Contractures Info: Not present    Additional Factors Info  Code Status, Allergies, Isolation Precautions Code Status Info: DNR Allergies Info: Valium (Diazepam), Robaxin (Methocarbamol), Tramadol, Codeine, Darifenacin, Darvon (Propoxyphene Hcl), Daypro (Oxaprozin), Enablex (Darifenacin Hydrobromide Er), Oxycodone, Propoxyphene, Risperdal (Risperidone),  Talwin (Pentazocine), Vicodin (Hydrocodone-acetaminophen)     Isolation Precautions Info: MRSA     Current Medications (08/30/2019):  This is the current hospital active medication list Current Facility-Administered Medications  Medication Dose Route Frequency Provider Last Rate Last Admin  . acetaminophen (TYLENOL) tablet 650 mg  650 mg Oral Q4H PRN Shela Leff, MD   650 mg at 08/26/19 2259  . albuterol (PROVENTIL) (2.5 MG/3ML) 0.083% nebulizer solution 3 mL  3 mL Inhalation Q6H PRN Shela Leff, MD   3 mL at 08/28/19 1819  . antiseptic oral rinse (BIOTENE) solution 15 mL  15 mL Topical PRN Rosezella Rumpf, NP      . benzonatate (TESSALON) capsule 100 mg  100 mg Oral TID Shela Leff, MD   100 mg at 08/30/19 0921  . bisacodyl (DULCOLAX) suppository 10 mg  10 mg Rectal Daily PRN Rosezella Rumpf, NP      . chlorhexidine (PERIDEX) 0.12 % solution 15 mL  15 mL Mouth Rinse BID Barb Merino, MD   15 mL at 08/30/19 0921  . glycopyrrolate (ROBINUL) injection 0.4 mg  0.4 mg Intravenous Q4H Rosezella Rumpf, NP   0.4 mg at 08/30/19 0810  . LORazepam (ATIVAN) injection 0.5-1 mg  0.5-1 mg Intravenous Q1H PRN Rosezella Rumpf, NP      . MEDLINE mouth rinse  15 mL Mouth Rinse q12n4p Barb Merino, MD   15 mL at 08/29/19 1600  . mometasone-formoterol (DULERA) 200-5 MCG/ACT inhaler 2 puff  2 puff Inhalation BID Shela Leff, MD   2 puff at 08/30/19 0819  . morphine 10 MG/5ML solution 2.5 mg  2.5 mg Oral Q1H PRN Rosezella Rumpf, NP      . morphine 2 MG/ML injection 1-2 mg  1-2 mg Intravenous Q1H PRN Rosezella Rumpf, NP   2 mg at 08/30/19 0945  . ondansetron (ZOFRAN) injection 4 mg  4 mg Intravenous Q8H PRN Lovey Newcomer T, NP   4 mg at 08/28/19 0410  . polyvinyl alcohol (LIQUIFILM TEARS) 1.4 % ophthalmic solution 1 drop  1 drop Both Eyes QID PRN Rosezella Rumpf, NP         Discharge Medications: Please see discharge summary for a list of discharge  medications.  Relevant Imaging Results:  Relevant Lab Results:   Additional Information SS#: [id number]  Benard Halsted, LCSW

## 2019-08-30 NOTE — Progress Notes (Signed)
PROGRESS NOTE    Meredith Gonzalez  BJY:[id number] DOB: 06-25-1935 DOA: 08/25/2019 PCP: Sinclair Ship, MD    Brief Narrative:   Patient is a 84 year old female with history of diastolic dysfunction, chronic bronchitis, coronary artery disease status post PCI, hypertension and hypothyroidism, fibromyalgia, recently suspected interstitial lung disease with progressive hypoxia presented from assisted living facility with ongoing shortness of breath and hypoxia despite treatment with oral Levaquin.  Retrospectively, patient is having dry cough, difficulty eating with odynophagia, progressive shortness of breath and oxygen requirement.  Her initial symptoms has a started many years ago.  Seen by pulmonary as outpatient and work-up was in progress. In the emergency room, 70% on 2 L oxygen.  Placed on 10 L nonrebreather.  WBC count 16.8.  Lactic acid normal.  Potassium was low.  BNP 400.  Troponins flat.  COVID-19 negative.  Chest x-ray showed significant interval progression of bilateral airspace disease consistent with CT scan of the chest done last month.  Admit to the hospital with hypoxia and pneumonia.  Subjective:  Patient looks extremely frail, very poor oral intake, more confused overnight .   Assessment & Plan:   Principal Problem:   HCAP (healthcare-associated pneumonia) Active Problems:   Chest pain   A-fib (HCC)   Acute respiratory failure with hypoxia (HCC)   CHF exacerbation (HCC)   Respiratory failure, acute ([place])   Palliative care by specialist   Goals of care, counseling/discussion   DNR (do not resuscitate)   Terminal care  Acute on chronic respiratory failure with hypoxia. Secondary to ILD exacerbation, and possible pneumonia. -With significant oxygen requirement on admission, she is having significantly fluctuating oxygen requirement during hospital stay, but overall requiring 10 L via high flow nasal cannula, and NRB intermittently for some symptomatic dyspnea . -She appears  to be significantly frail, and deconditioned, with poor inspiratory effort. -Monitor treated with IV antibiotics initially, this has been adjusted, narrowed to [facility]. -She is on IV steroids for ILD exacerbation, management per PCCM.   - Overall extremely frail, deconditioned, appears to be uncomfortable with her work of breathing and hypoxia, palliative medicine has been consulted to address goals of care, she had no improvement despite maximal medical management, and given the amount of discomfort and dyspnea, patient and family decided to pursue full comfort measures, palliative input and assessment and addressing goals of care and symptom management greatly appreciated.  Chronic diastolic heart failure: - Chronic diastolic dysfunction with no evidence of exacerbation.  Her crackles on examination are mostly due to #1.  Potassium replaced, intermittent Lasix.  Chronic atrial fibrillation:  - Currently rate controlled.  She is therapeutic on Eliquis. -Has been started given she is hospice  Dysphagia/odynophagia: -  Chronic dysphagia odynophagia with cough.  Suspect Barrett's esophagus or motility disorder.  Patient is not clinically stable for upper GI endoscopy. - This is terminal.  Hypokalemia:  - Repleted   failure to thrive, moderate protein calorie malnutrition:  -She is currently comfort care  Goal of care: -Palliative consult greatly appreciated, patient has been transitioned to comfort measures.  [facility].   DVT prophylaxis: Eliquis Code Status: DNR/DNI Family Communication: None at bedside Disposition Plan: Status is: Inpatient  Remains inpatient appropriate because:Inpatient level of care appropriate due to severity of illness   Dispo: The patient is from: ALF              Anticipated d/c is to: Hospice house versus hospital death  Anticipated d/c date is: 3 days              Patient currently is  not medically stable to d/c. Is extremely frail, possibly hospital discharge versus transfer to hospice facility.    Consultants:   PCCM  Palliative  Procedures:   None  Antimicrobials:  Anti-infectives (From admission, onward)   Start     Dose/Rate Route Frequency Ordered Stop   08/27/19 1000  cefdinir (OMNICEF) capsule 300 mg  Status:  Discontinued     300 mg Oral Every 12 hours 08/27/19 0959 08/29/19 1224   08/26/19 1500  vancomycin (VANCOCIN) IVPB 1000 mg/200 mL premix  Status:  Discontinued     1,000 mg 200 mL/hr over 60 Minutes Intravenous Every 36 hours 08/25/19 0307 08/27/19 1306   08/25/19 1600  ceFEPIme (MAXIPIME) 2 g in sodium chloride 0.9 % 100 mL IVPB  Status:  Discontinued     2 g 200 mL/hr over 30 Minutes Intravenous Every 12 hours 08/25/19 0307 08/27/19 0959   08/25/19 0015  vancomycin (VANCOCIN) IVPB 1000 mg/200 mL premix     1,000 mg 200 mL/hr over 60 Minutes Intravenous  Once 08/25/19 0000 08/25/19 0250   08/25/19 0015  ceFEPIme (MAXIPIME) 2 g in sodium chloride 0.9 % 100 mL IVPB     2 g 200 mL/hr over 30 Minutes Intravenous  Once 08/25/19 0000 08/25/19 0130          Objective: Vitals:   08/29/19 0808 08/29/19 0823 08/29/19 2005 08/30/19 0820  BP: (!) 151/61  (!) 167/66   Pulse: 73 74 78   Resp: 16 17 18    Temp: 97.9 F (36.6 C)  (!) 97.3 F (36.3 C)   TempSrc: Oral  Oral   SpO2: 97% 93% (!) 89% 93%  Weight:      Height:        Intake/Output Summary (Last 24 hours) at 08/30/2019 1449 Last data filed at 08/30/2019 0917 Gross per 24 hour  Intake 120 ml  Output --  Net 120 ml   Filed Weights   08/27/19 0502 08/28/19 0420 08/29/19 0450  Weight: 50.4 kg 48.8 kg 48.8 kg    Examination:  Awake, extremely frail, deconditioned, appears comfortable after receiving some morphine . Diminished air entry at the bases, with scattered rhonchi  Regular rate and rhythm  Abdomen soft  Extremities with no edema or cyanosis .    Data Reviewed: I have  personally reviewed following labs and imaging studies  CBC: Recent Labs  Lab 08/25/2019 1906 08/25/19 0100 08/25/19 0352 08/27/19 0626  WBC 16.8*  --  16.2* 14.6*  NEUTROABS 15.1*  --   --  14.0*  HGB 11.0* 11.2* 10.8* 10.3*  HCT 36.5 33.0* 37.8 35.0*  MCV 103.4*  --  111.8* 104.2*  PLT 415*  --  324 062   Basic Metabolic Panel: Recent Labs  Lab 09/08/2019 1906 09/20/2019 1906 08/25/19 0100 08/25/19 0352 08/26/19 0452 08/27/19 0626 08/28/19 1209 08/29/19 0649  NA 142   < > 143  --  141 140 137 139  K 3.2*   < > 3.1*  --  2.6* 3.8 4.7 5.0  CL 103  --   --   --  104 100 100 97*  CO2 26  --   --   --  29 29 29  32  GLUCOSE 109*  --   --   --  108* 177* 212* 134*  BUN 17  --   --   --  18 24* 33* 36*  CREATININE 0.85  --   --   --  0.75 0.77 0.71 0.70  CALCIUM 8.6*  --   --   --  7.6* 7.8* 8.3* 8.7*  MG  --   --   --  2.0  --  2.4  --   --    < > = values in this interval not displayed.   GFR: Estimated Creatinine Clearance: 41.8 mL/min (by C-G formula based on SCr of 0.7 mg/dL). Liver Function Tests: No results for input(s): AST, ALT, ALKPHOS, BILITOT, PROT, ALBUMIN in the last 168 hours. No results for input(s): LIPASE, AMYLASE in the last 168 hours. No results for input(s): AMMONIA in the last 168 hours. Coagulation Profile: No results for input(s): INR, PROTIME in the last 168 hours. Cardiac Enzymes: No results for input(s): CKTOTAL, CKMB, CKMBINDEX, TROPONINI in the last 168 hours. BNP (last 3 results) No results for input(s): PROBNP in the last 8760 hours. HbA1C: No results for input(s): HGBA1C in the last 72 hours. CBG: Recent Labs  Lab 08/28/19 0749 08/28/19 1146  GLUCAP 176* 183*   Lipid Profile: No results for input(s): CHOL, HDL, LDLCALC, TRIG, CHOLHDL, LDLDIRECT in the last 72 hours. Thyroid Function Tests: No results for input(s): TSH, T4TOTAL, FREET4, T3FREE, THYROIDAB in the last 72 hours. Anemia Panel: No results for input(s): VITAMINB12, FOLATE,  FERRITIN, TIBC, IRON, RETICCTPCT in the last 72 hours. Sepsis Labs: Recent Labs  Lab 08/30/2019 2359 08/25/19 0352 08/25/19 1746  PROCALCITON  --  1.77 1.43  LATICACIDVEN 1.8  --   --     Recent Results (from the past 240 hour(s))  Blood culture (routine x 2)     Status: None   Collection Time: 08/25/19 12:12 AM   Specimen: BLOOD  Result Value Ref Range Status   Specimen Description BLOOD RIGHT ANTECUBITAL  Final   Special Requests   Final    BOTTLES DRAWN AEROBIC AND ANAEROBIC Blood Culture adequate volume   Culture   Final    NO GROWTH 5 DAYS Performed at [facility], [place] [address] [place], [place] [address]    Report Status 08/30/2019 FINAL  Final  Blood culture (routine x 2)     Status: None   Collection Time: 08/25/19 12:12 AM   Specimen: BLOOD RIGHT FOREARM  Result Value Ref Range Status   Specimen Description BLOOD RIGHT FOREARM  Final   Special Requests   Final    BOTTLES DRAWN AEROBIC AND ANAEROBIC Blood Culture adequate volume   Culture   Final    NO GROWTH 5 DAYS Performed at [facility], [place] [address], [place],  [address]    Report Status 08/30/2019 FINAL  Final  SARS Coronavirus 2 by RT PCR (hospital order, performed in [facility] hospital lab) Nasopharyngeal Nasopharyngeal Swab     Status: None   Collection Time: 08/25/19 12:23 AM   Specimen: Nasopharyngeal Swab  Result Value Ref Range Status   SARS Coronavirus 2 NEGATIVE NEGATIVE Final    Comment: (NOTE) SARS-CoV-2 target nucleic acids are NOT DETECTED. The SARS-CoV-2 RNA is generally detectable in upper and lower respiratory specimens during the acute phase of infection. The lowest concentration of SARS-CoV-2 viral copies this assay can detect is 250 copies / mL. A negative result does not preclude SARS-CoV-2 infection and should not be used as the sole basis for treatment or other patient management decisions.  A negative result may occur with improper specimen collection /  handling, submission of specimen other than nasopharyngeal swab, presence of viral mutation(s) within the areas targeted by this assay, and inadequate number of viral copies (<250 copies / mL). A negative result must be combined with clinical observations, patient history, and epidemiological information. Fact Sheet for Patients:   StrictlyIdeas.no Fact Sheet for Healthcare Providers: BankingDealers.co.za This test is not yet approved or cleared  by the [place] FDA and has been authorized for detection and/or diagnosis of SARS-CoV-2 by FDA under an Emergency Use Authorization (EUA).  This EUA will remain in effect (meaning this test can be used) for the duration of the COVID-19 declaration under Section 564(b)(1) of the Act, 21 U.S.C. section 360bbb-3(b)(1), unless the authorization is terminated or revoked sooner. Performed at [facility], [place] [address] [place], [place] [address]   Urine culture     Status: Abnormal   Collection Time: 08/25/19  3:27 AM   Specimen: Urine, Random  Result Value Ref Range Status   Specimen Description URINE, RANDOM  Final   Special Requests NONE  Final   Culture (A)  Final    <10,000 COLONIES/mL INSIGNIFICANT GROWTH Performed at [facility], [place] [address] [place], [place] [address]    Report Status 08/25/2019 FINAL  Final  MRSA PCR Screening     Status: None   Collection Time: 08/26/19 11:54 AM   Specimen: Nasal Mucosa; Nasopharyngeal  Result Value Ref Range Status   MRSA by PCR NEGATIVE NEGATIVE Final    Comment:        The GeneXpert MRSA Assay (FDA approved for NASAL specimens only), is one component of a comprehensive MRSA colonization surveillance program. It is not intended to diagnose MRSA infection nor to guide or monitor treatment for MRSA infections. Performed at [facility], [place] [address], [place], [place] [address]          Radiology Studies: No  results found.      Scheduled Meds:  benzonatate  100 mg Oral TID   chlorhexidine  15 mL Mouth Rinse BID   glycopyrrolate  0.4 mg Intravenous Q4H   mouth rinse  15 mL Mouth Rinse q12n4p   mometasone-formoterol  2 puff Inhalation BID   Continuous Infusions:  morphine       LOS: 5 days      Phillips Climes, MD [facility]

## 2019-08-30 NOTE — Progress Notes (Signed)
Palliative Medicine RN Note: Afternoon symptom check.  Patient is awake and able to make her needs known. Grandchildren and dog Millie are with her. When asked, Mrs Najera reports she "feels bad" and is having trouble breathing. She has gotten two 2mg  doses of morphine, 2 hours apart (most recently 2 hours ago); I will ask the RN to give another dose. Mrs Crooke is feeling too sleepy and unwell to participate fully in conversation, but I discussed with her grandchildren in person and son via phone that I will obtain an order to start a morphine infusion, as she is having uncontrolled symptoms; pt and all family members agree to the plan.   I spoke with PMT NP Megan who gave orders and will come see Mrs Tramble. I was unable to find the RN Greta, but I spoke with the charge RN, who will give the morphine and is aware of the order for morphine infusion.  Marjie Skiff Leor Whyte, RN, BSN, [facility] Palliative Medicine Team 08/30/2019 3:29 PM Office [phone]

## 2019-08-30 NOTE — Progress Notes (Signed)
Hold Morphine drip per PA. Will continue to monitor.

## 2019-08-30 NOTE — Progress Notes (Signed)
Engineer, maintenance [facility]) Hospital Liaison note.     [facility] is unable to offer a room today.  Liaison offered [facility] but family declined.   Hospital Liaison will follow up tomorrow or sooner if a room becomes available.    A Please do not hesitate to call with questions.     Thank you,    Farrel Gordon, RN, [facility]       [place] (listed on AMION under [place])     [phone]

## 2019-08-30 NOTE — Progress Notes (Addendum)
9:30am-CSW spoke with patient's son at bedside. He requested CSW find out if patient can return to [facility] with Hospice as an alternative to [employer]. CSW spoke with Carla Drape at [facility] and faxed clinicals for review. [facility] does not have a bed available and son has declined Public relations account executive location.   12pm-Per Dottie at [facility], they are unable to care for patient in her current condition. Son in agreement for [facility] once bed is available. He expressed concern about patient's ED experience and requests to speak with someone about it. CSW made Charge RN aware.   Taten Merrow LCSW

## 2019-08-30 NOTE — Progress Notes (Signed)
   Palliative Medicine Inpatient Follow Up Note   Reason for consult:  Goals of care/terminal care in the setting of interstitial lung disease  HPI:  Per intake H&P --> 84 year old with history of persistent atrial fibrillation on amiodarone, diastolic heart failure, hypothyroidism, CVA coronary artery disease, ongoingevaluationinpulmonary clinic for interstitial lung disease. 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 Presenting with worsening hypoxic respiratory failure, chest x-ray with worsening bilateral infiltrates. She had been treated with Levaquin at her nursing facility with no improvement.  Palliative care was asked to help with goals of care conversations. Clinical condition continued to decline despite aggressive medical management. Patient/family decision for transition to comfort measures on 08/29/19.  Assessment: Acute on chronic respiratory failure with hypoxia ILD exacerbation Possible pneumonia Chronic diastolic CHF Chronic afib Dysphagia FTT Dyspnea Anxiety state Pruritus  GOC:  Received call from palliative RN for symptom management assistance. Patient remains restless and short of breath despite 6mg  IV morphine. RN requesting a continuous infusion for comfort. RN has discussed this with son, who is agreeable emphasizing goal for comfort.   F/u x2 with granddaughters and then son/DIL at bedside this afternoon. Introduced myself with PMT. Morphine infusion recently started and patient appears comfortable. RN recently gave prn ativan for restlessness/pulling at IV. Per family, she is allergic to opioids and they felt she was itching. We discussed scheduling benadryl to help manage pruritus. Son agreeable. Discussed other symptom management medications. Discussed EOL expectations, comfort feeds, visitor policy. Answered questions and concerns. Their pastor has visited. PMT contact information given. Emotional support provided.   SUMMARY OF  RECOMMENDATIONS  Comfort measures only  DNR/DNI  Symptom management--see below  Unrestricted visitor access as patient nears EOL.  Continue comfort feeds/sips per patient/family request.   Continue frequent oral care and repositioning for comfort.   No beds at [facility]. Worsening symptom burden today. May need to keep inpatient for EOL care.   PMT will continue to follow.    Symptom Management: Dyspnea: Pain:  -Morphine 0.5mg  continuous infusion  -RN may bolus via infusion 1mg  IV q22min prn breakthrough pain/dyspnea/air hunger tachypnea   Fever:  - Tylenol 650mg  PO/PR Q6H PRN Agitation: Anxiety:  - Lorazepam 1mg  PO/SL/IV  Q3H prn  -Haldol 2mg  IV q6h prn   Nausea:  - Zofran 4mg  PO/IV Q6H PRN  Secretions:  - Glycopyrrolate 0.4mg  IV Q4H scheduled Pruritus:   -Benadryl 12.5mg  IV Q6H scheduled Dry Eyes:  - Artificial Tears PRN Xerostomia:  - Biotene 71ml PRN  - BID oral care Urinary Retention:  - Bladder scan if > 234ml place and maintain foley  Constipation:  - Bisacodyl 10mg  PR PRN QDay Spiritual:  - Chaplain consult  Time Spent: 40 Greater than 50% of this time was spent in counseling and coordination of care.  ______________________________________________________________________________________   Ihor Dow, DNP, FNP-C Palliative Medicine Team  Phone: [phone] Fax: [phone]   Please utilize secure chat with additional questions, if there is no response within 30 minutes please call the above phone number  Palliative Medicine Team providers are available by phone from 7am to 7pm daily and can be reached through the team cell phone.  Should this patient require assistance outside of these hours, please call the patient's attending physician.

## 2019-08-31 DIAGNOSIS — J849 Interstitial pulmonary disease, unspecified: Secondary | ICD-10-CM

## 2019-08-31 DIAGNOSIS — R1310 Dysphagia, unspecified: Secondary | ICD-10-CM

## 2019-08-31 DIAGNOSIS — R338 Other retention of urine: Secondary | ICD-10-CM

## 2019-08-31 DIAGNOSIS — J42 Unspecified chronic bronchitis: Secondary | ICD-10-CM

## 2019-08-31 MED ORDER — CHLORHEXIDINE GLUCONATE CLOTH 2 % EX PADS
6.0000 | MEDICATED_PAD | Freq: Every day | CUTANEOUS | Status: DC
  Administered 2019-08-31: 6 via TOPICAL

## 2019-09-23 NOTE — Progress Notes (Signed)
Morphine rate was increased to 1mg /hr after  X 2 bolus. Will continue to monitor.

## 2019-09-23 NOTE — Progress Notes (Addendum)
Patient's abdomen this morning distended; at bladder scan there were 767 cc urine. Per MD verbal order Foley cath was placed for comfort EOL. Patient tolerated well. Family at bedside. Will continue to monitor.

## 2019-09-23 NOTE — Progress Notes (Addendum)
Patient passed away at 17:30 o'clock. Alphonsus Sias, RN and Ramonita Lab, RN pronounced the death. Rosanne Sack, patient's son was informed. MD was notified.

## 2019-09-23 NOTE — Progress Notes (Signed)
Manufacturing engineer [facility])  [facility] still does not have a bed to offer today.  Noted likely hospital death per PMT.    Will keep her on the list in case.  Venia Carbon RN, BSN, [facility] Liaison

## 2019-09-23 NOTE — Death Summary Note (Signed)
DEATH SUMMARY   Patient Details  Name: Meredith Gonzalez MRN: [id number] DOB: 1935/08/30  Admission/Discharge Information   Admit Date:  September 01, 2019  Date of Death: Date of Death: 2019-09-08  Time of Death: Time of Death: 23-Jun-1728  Length of Stay: 06/06/2022  Referring Physician: Sinclair Ship, MD   Reason(s) for Hospitalization   Acute hypoxic respiratory failure and shortness of breath  Diagnoses  Preliminary cause of death:   - Acute hypoxic respiratory failure secondary to interstitial lung disease and pneumonia.  Secondary Diagnoses (including complications and co-morbidities):  Principal Problem:   HCAP (healthcare-associated pneumonia) Active Problems:   Depression   Hypertension   Hypothyroidism   Diabetes ([place])   Pacemaker   Abnormal stress test   Chest pain   CAD (coronary artery disease)   Ischemic cardiomyopathy   CVA (cerebral vascular accident) ([place])   Anemia of chronic disease   Hypoalbuminemia due to protein-calorie malnutrition (HCC)   Labile blood pressure   A-fib (HCC)   Chronic diastolic congestive heart failure ([place])   Acute ischemic stroke (HCC)   Dyspnea   Failure to thrive in adult   Acute respiratory failure with hypoxia (HCC)   CHF exacerbation (HCC)   Respiratory failure, acute ([place])   Palliative care by specialist   Goals of care, counseling/discussion   DNR (do not resuscitate)   Terminal care   Anxiety state   Pruritus   Dysphagia   Interstitial lung disease ([place])   Acute urinary retention   Chronic bronchitis ([place])   Brief Hospital Course (including significant findings, care, treatment, and services provided and events leading to death)  Meredith Gonzalez is a 84 y.o. year old female who with past medical history of chronic diastolic CHF, chronic bronchitis, CAD status post PCI, diabetes mellitus, hypertension, hypothyroidism, history of CVA, fibromyalgia, and recurrent falls, patient presents to ED from nursing home secondary to shortness of breath,  patient with recent hospitalization secondary to pneumonia, she was treated with levofloxacin, and placed on 2 L oxygen, and symptoms has been progressing over a week prior to admission, with progressive dyspnea, dry cough, and fatigue, reports chest pain prior to admission as well, in ED patient was noted to have severe hypoxia with oxygen saturation in low 70s and 2 L nasal cannula, where she had significantly increased oxygen requirement, where it had been fluctuating from 8 L high flow nasal cannula to at some points requiring NRB in addition to her 10 L high flow nasal cannula, patient with known history of persistent atrial fibrillation, used to be treated with amiodarone, her CT chest findings showing alternate pattern significant for interstitial lung disease/pulmonary fibrosis with worsening bilateral infiltrate, so pulmonary critical care service were consulted regarding further management, patient was started on appropriate antibiotic coverage spectrum antibiotic including IV vancomycin and cefepime, which later has been narrowed to [facility], as well given the finding of her interstitial lung disease she was started on IV steroids per PCCM recommendation, patient with underlying chronic diastolic heart failure, where she was intermittent Lasix dosing, as well patient with known history of dysphagia/odynophagia, which is chronic, given she presents with cough, with suspicion of Barrett's esophagitis or motility disorder, overall patient critically ill since admission, extremely frail and deconditioned, she was not clinically stable for any upper GI endoscopy or any GI evaluation, she is with known failure to thrive, moderate protein calorie malnutrition, overall patient with worsening clinical status, with no improvement despite optimal medical management with steroids, antibiotics and as needed diuresis, palliative medicine  has been involved early in admission anticipated poor prognosis and outcome in this  extremely frail deconditioned female with multiple comorbidities at very poor baseline, and discussion has been carried with the family, given patient no improvement despite optimal medical management, and worsening hypoxic respiratory failure, decision has been made to transition to comfort care, for which has been managed closely by palliative medicine, where initially she was on as needed morphine and Ativan for her significant dyspnea and work of breathing, later she was transitioned to morphine drip as well patient required Foley catheter insertion given urinary retention, [facility] but they had no bed availability, patient passed away 2019/09/08 at 1730 p.m., family aware updated and notified,     Pertinent Labs and Studies  Significant Diagnostic Studies DG Chest 2 View  Result Date: 09/16/2019 CLINICAL DATA:  84 year old female with shortness of breath. EXAM: CHEST - 2 VIEW COMPARISON:  Chest radiograph dated 10/19/2018 and chest CT dated 07/29/2019. FINDINGS: There is background of chronic interstitial coarsening and bronchitic changes. Diffuse bilateral interstitial and hazy airspace opacities noted which may represent edema or multifocal pneumonia or combination. Overall significant progression of bilateral airspace opacities compared to the recent CT. No large pleural effusion. There is no pneumothorax. Mild cardiomegaly. Left pectoral pacemaker device. There is discontinuation of 1 of the leads of the pacemaker similar to prior radiograph. Osteopenia with degenerative changes of the spine. An IVC filter is noted. Old healed right humeral fracture. IMPRESSION: Significant interval progression of bilateral airspace opacities compared to the recent CT. Findings represent multifocal pneumonia, or edema, or combination. Clinical correlation and continued follow-up recommended. Electronically Signed   By: Anner Crete M.D.   On:  09/16/2019 19:55   ECHOCARDIOGRAM COMPLETE  Result Date: 08/25/2019    ECHOCARDIOGRAM REPORT   Patient Name:   DEVANSHI CALIFF Date of Exam: 08/25/2019 Medical Rec #:  [id number]     Height:       63.0 in Accession #:    [id number]    Weight:       110.4 lb Date of Birth:  Feb 23, 1936     BSA:          1.502 m Patient Age:    73 years      BP:           152/63 mmHg Patient Gender: F             HR:           81 bpm. Exam Location:  Inpatient Procedure: 2D Echo Indications:    CHF 428  History:        Patient has prior history of Echocardiogram examinations, most                 recent 10/19/2018. CHF, Previous Myocardial Infarction,                 Pacemaker, Stroke; Risk Factors:Hypertension and Diabetes.  Sonographer:    Jannett Celestine RDCS (AE) Referring Phys: [id number] VASUNDHRA [place]  1. Left ventricular ejection fraction, by estimation, is 60 to 65%. The left ventricle has normal function. The left ventricle has no regional wall motion abnormalities. There is mild left ventricular hypertrophy. Left ventricular diastolic parameters are consistent with Grade I diastolic dysfunction (impaired relaxation).  2. Right ventricular systolic function is normal. The right ventricular size is normal. There is normal pulmonary artery systolic pressure. The estimated right ventricular systolic pressure is 35.7  mmHg.  3. The mitral valve is normal in structure. No evidence of mitral valve regurgitation. No evidence of mitral stenosis.  4. The aortic valve is tricuspid. Aortic valve regurgitation is not visualized. Mild to moderate aortic valve sclerosis/calcification is present, without any evidence of aortic stenosis.  5. The inferior vena cava is normal in size with greater than 50% respiratory variability, suggesting right atrial pressure of 3 mmHg. FINDINGS  Left Ventricle: Left ventricular ejection fraction, by estimation, is 60 to 65%. The left ventricle has normal function. The left ventricle has no regional  wall motion abnormalities. The left ventricular internal cavity size was normal in size. There is  mild left ventricular hypertrophy. Left ventricular diastolic parameters are consistent with Grade I diastolic dysfunction (impaired relaxation). Right Ventricle: The right ventricular size is normal. No increase in right ventricular wall thickness. Right ventricular systolic function is normal. There is normal pulmonary artery systolic pressure. The tricuspid regurgitant velocity is 2.86 m/s, and  with an assumed right atrial pressure of 3 mmHg, the estimated right ventricular systolic pressure is 65.9 mmHg. Left Atrium: Left atrial size was normal in size. Right Atrium: Right atrial size was normal in size. Pericardium: There is no evidence of pericardial effusion. Mitral Valve: The mitral valve is normal in structure. There is mild thickening of the mitral valve leaflet(s). There is mild calcification of the mitral valve leaflet(s). No evidence of mitral valve regurgitation. No evidence of mitral valve stenosis. Tricuspid Valve: The tricuspid valve is normal in structure. Tricuspid valve regurgitation is mild. Aortic Valve: The aortic valve is tricuspid. Aortic valve regurgitation is not visualized. Mild to moderate aortic valve sclerosis/calcification is present, without any evidence of aortic stenosis. Pulmonic Valve: The pulmonic valve was normal in structure. Pulmonic valve regurgitation is not visualized. Aorta: The aortic root is normal in size and structure. Venous: The inferior vena cava is normal in size with greater than 50% respiratory variability, suggesting right atrial pressure of 3 mmHg. IAS/Shunts: No atrial level shunt detected by color flow Doppler. Additional Comments: A pacer wire is visualized in the right ventricle.  LEFT VENTRICLE PLAX 2D LVIDd:         4.80 cm LVIDs:         3.20 cm LV PW:         1.20 cm LV IVS:        0.90 cm LVOT diam:     1.80 cm LV SV:         50 LV SV Index:   33 LVOT  Area:     2.54 cm  LEFT ATRIUM             Index       RIGHT ATRIUM           Index LA diam:        3.60 cm 2.40 cm/m  RA Area:     10.40 cm LA Vol (A2C):   40.9 ml 27.23 ml/m RA Volume:   18.70 ml  12.45 ml/m LA Vol (A4C):   22.6 ml 15.05 ml/m LA Biplane Vol: 30.5 ml 20.30 ml/m  AORTIC VALVE LVOT Vmax:   94.20 cm/s LVOT Vmean:  71.200 cm/s LVOT VTI:    0.195 m  AORTA Ao Root diam: 2.80 cm MITRAL VALVE               TRICUSPID VALVE MV Area (PHT): 2.84 cm    TR Peak grad:   32.7 mmHg MV Decel Time: 267  msec    TR Vmax:        286.00 cm/s MV E velocity: 68.90 cm/s MV A velocity: 96.30 cm/s  SHUNTS MV E/A ratio:  0.72        Systemic VTI:  0.20 m                            Systemic Diam: 1.80 cm Loralie Champagne MD Electronically signed by Loralie Champagne MD Signature Date/Time: 08/25/2019/6:33:29 PM    Final    DG ESOPHAGUS W SINGLE CM (SOL OR THIN BA)  Result Date: 08/25/2019 CLINICAL DATA:  Dysphagia for 1 month. Coughing with meals and regurgitation. EXAM: ESOPHOGRAM/BARIUM SWALLOW TECHNIQUE: Single contrast examination was performed using  thin barium. FLUOROSCOPY TIME:  Fluoroscopy Time:  2 minutes, 30 seconds Radiation Exposure Index (if provided by the fluoroscopic device): 16.0 mGy Number of Acquired Spot Images: 0 COMPARISON:  Chest CT 07/29/2019 FINDINGS: The patient is unable to stand or turn while, in the entire exam was performed with patient in the left posterior oblique position. Primary peristaltic waves were disrupted on 03/04 swallows. Secondary and tertiary contractions are noted. The patient appears to have a gastric bypass. There is scattered gas in the esophagus, most notable in the upper thoracic esophagus forming transient filling defects in the barium column which are somewhat irregular. Esophageal varices can cause a similar appearance although more typically encountered in the lower esophagus rather than the upper esophagus. Just above the level of the aortic arch, there a double contour  of the esophageal wall suggesting ulceration and focal mural irregularity. This is shown for example on image 11/1. Fold thickening in the remainder of the esophagus probably reflects esophagitis. IMPRESSION: 1. Please note that due to patient infirmity, all images were taken in the LPO position, and other positions and typical portions of the esophagram examination could not be performed. This reduces diagnostic sensitivity and specificity. 2. Mural irregularity with a suggestion of contrast tracking within the wall of the upper thoracic esophagus at and above the level of the aortic arch, possibly from ulceration, benign esophageal pseudodiverticulosis, or esophageal peridiverticulitis. This localized contrast within the wall of the esophagus appears contained. Assessment of this region complicated by the presence of filling defects thought to be gas bubbles which congregated in this area. This could be further assessed with endoscopy if clinically warranted. 3. Fold thickening in the remainder of the esophagus probably reflects esophagitis. 4. Nonspecific esophageal dysmotility. Electronically Signed   By: Van Clines M.D.   On: 08/25/2019 13:32    Microbiology Recent Results (from the past 240 hour(s))  Blood culture (routine x 2)     Status: None   Collection Time: 08/25/19 12:12 AM   Specimen: BLOOD  Result Value Ref Range Status   Specimen Description BLOOD RIGHT ANTECUBITAL  Final   Special Requests   Final    BOTTLES DRAWN AEROBIC AND ANAEROBIC Blood Culture adequate volume   Culture   Final    NO GROWTH 5 DAYS Performed at [facility], [address], [place], [place] [address]    Report Status 08/30/2019 FINAL  Final  Blood culture (routine x 2)     Status: None   Collection Time: 08/25/19 12:12 AM   Specimen: BLOOD RIGHT FOREARM  Result Value Ref Range Status   Specimen Description BLOOD RIGHT FOREARM  Final   Special Requests   Final    BOTTLES DRAWN AEROBIC AND  ANAEROBIC Blood Culture adequate volume   Culture   Final    NO GROWTH 5 DAYS Performed at [facility], [place] [address] [place], [place] [address]    Report Status 08/30/2019 FINAL  Final  SARS Coronavirus 2 by RT PCR (hospital order, performed in [facility] hospital lab) Nasopharyngeal Nasopharyngeal Swab     Status: None   Collection Time: 08/25/19 12:23 AM   Specimen: Nasopharyngeal Swab  Result Value Ref Range Status   SARS Coronavirus 2 NEGATIVE NEGATIVE Final    Comment: (NOTE) SARS-CoV-2 target nucleic acids are NOT DETECTED. The SARS-CoV-2 RNA is generally detectable in upper and lower respiratory specimens during the acute phase of infection. The lowest concentration of SARS-CoV-2 viral copies this assay can detect is 250 copies / mL. A negative result does not preclude SARS-CoV-2 infection and should not be used as the sole basis for treatment or other patient management decisions.  A negative result may occur with improper specimen collection / handling, submission of specimen other than nasopharyngeal swab, presence of viral mutation(s) within the areas targeted by this assay, and inadequate number of viral copies (<250 copies / mL). A negative result must be combined with clinical observations, patient history, and epidemiological information. Fact Sheet for Patients:   StrictlyIdeas.no Fact Sheet for Healthcare Providers: BankingDealers.co.za This test is not yet approved or cleared  by the [place] FDA and has been authorized for detection and/or diagnosis of SARS-CoV-2 by FDA under an Emergency Use Authorization (EUA).  This EUA will remain in effect (meaning this test can be used) for the duration of the COVID-19 declaration under Section 564(b)(1) of the Act, 21 U.S.C. section 360bbb-3(b)(1), unless the authorization is terminated or revoked sooner. Performed at [facility], [place] [address],  [place], [place] [address]   Urine culture     Status: Abnormal   Collection Time: 08/25/19  3:27 AM   Specimen: Urine, Random  Result Value Ref Range Status   Specimen Description URINE, RANDOM  Final   Special Requests NONE  Final   Culture (A)  Final    <10,000 COLONIES/mL INSIGNIFICANT GROWTH Performed at [facility], [place] [address], [place], [place] [address]    Report Status 08/25/2019 FINAL  Final  MRSA PCR Screening     Status: None   Collection Time: 08/26/19 11:54 AM   Specimen: Nasal Mucosa; Nasopharyngeal  Result Value Ref Range Status   MRSA by PCR NEGATIVE NEGATIVE Final    Comment:        The GeneXpert MRSA Assay (FDA approved for NASAL specimens only), is one component of a comprehensive MRSA colonization surveillance program. It is not intended to diagnose MRSA infection nor to guide or monitor treatment for MRSA infections. Performed at [facility], [place] [address] [place], [place] [address]     Lab Basic Metabolic Panel: Recent Labs  Lab 09/11/2019 1906 09/20/2019 1906 08/25/19 0100 08/25/19 0352 08/26/19 0452 08/27/19 0626 08/28/19 1209 08/29/19 0649  NA 142   < > 143  --  141 140 137 139  K 3.2*   < > 3.1*  --  2.6* 3.8 4.7 5.0  CL 103  --   --   --  104 100 100 97*  CO2 26  --   --   --  29 29 29  32  GLUCOSE 109*  --   --   --  108* 177* 212* 134*  BUN 17  --   --   --  18 24* 33* 36*  CREATININE 0.85  --   --   --  0.75 0.77 0.71 0.70  CALCIUM 8.6*  --   --   --  7.6* 7.8* 8.3* 8.7*  MG  --   --   --  2.0  --  2.4  --   --    < > = values in this interval not displayed.   Liver Function Tests: No results for input(s): AST, ALT, ALKPHOS, BILITOT, PROT, ALBUMIN in the last 168 hours. No results for input(s): LIPASE, AMYLASE in the last 168 hours. No results for input(s): AMMONIA in the last 168 hours. CBC: Recent Labs  Lab 09/20/2019 1906 08/25/19 0100 08/25/19 0352 08/27/19 0626  WBC 16.8*  --  16.2* 14.6*  NEUTROABS 15.1*   --   --  14.0*  HGB 11.0* 11.2* 10.8* 10.3*  HCT 36.5 33.0* 37.8 35.0*  MCV 103.4*  --  111.8* 104.2*  PLT 415*  --  324 393   Cardiac Enzymes: No results for input(s): CKTOTAL, CKMB, CKMBINDEX, TROPONINI in the last 168 hours. Sepsis Labs: Recent Labs  Lab 09/21/2019 1906 08/25/2019 2359 08/25/19 0352 08/25/19 1746 08/27/19 0626  PROCALCITON  --   --  1.77 1.43  --   WBC 16.8*  --  16.2*  --  14.6*  LATICACIDVEN  --  1.8  --   --   --     Procedures/Operations    Diamon Reddinger 2019-09-05, 6:28 PM

## 2019-09-23 NOTE — Progress Notes (Signed)
PROGRESS NOTE    Meredith Gonzalez  WUJ:[id number] DOB: [birth year] DOA: 09/01/2019 PCP: Sinclair Ship, MD    Brief Narrative:   Patient is a 84 year old female with history of diastolic dysfunction, chronic bronchitis, coronary artery disease status post PCI, hypertension and hypothyroidism, fibromyalgia, recently suspected interstitial lung disease with progressive hypoxia presented from assisted living facility with ongoing shortness of breath and hypoxia despite treatment with oral Levaquin.  Retrospectively, patient is having dry cough, difficulty eating with odynophagia, progressive shortness of breath and oxygen requirement.  Her initial symptoms has a started many years ago.  Seen by pulmonary as outpatient and work-up was in progress. In the emergency room, 70% on 2 L oxygen.  Placed on 10 L nonrebreather.  WBC count 16.8.  Lactic acid normal.  Potassium was low.  BNP 400.  Troponins flat.  COVID-19 negative.  Chest x-ray showed significant interval progression of bilateral airspace disease consistent with CT scan of the chest done last month.  Admit to the hospital with hypoxia and pneumonia.  Subjective:  Patient looks extremely frail, very poor oral intake, he had some suprapubic fullness, had urinary retention, Foley inserted.   Assessment & Plan:   Principal Problem:   HCAP (healthcare-associated pneumonia) Active Problems:   Chest pain   A-fib (HCC)   Acute respiratory failure with hypoxia (HCC)   CHF exacerbation (HCC)   Respiratory failure, acute ([place])   Palliative care by specialist   Goals of care, counseling/discussion   DNR (do not resuscitate)   Terminal care   Anxiety state   Pruritus  Acute on chronic respiratory failure with hypoxia. Secondary to ILD exacerbation, and possible pneumonia. -With significant oxygen requirement on admission, she is having significantly fluctuating oxygen requirement during hospital stay, but overall requiring 10 L via high flow nasal  cannula, and NRB intermittently for some symptomatic dyspnea . -She appears to be significantly frail, and deconditioned, with poor inspiratory effort. -Monitor treated with IV antibiotics initially, this has been adjusted, narrowed to [facility]. -She is on IV steroids for ILD exacerbation, management per PCCM.   - Overall extremely frail, deconditioned, appears to be uncomfortable with her work of breathing and hypoxia, palliative medicine has been consulted to address goals of care, she had no improvement despite maximal medical management, and given the amount of discomfort and dyspnea, patient and family decided to pursue full comfort measures, palliative input and assessment and addressing goals of care and symptom management greatly appreciated.  Chronic diastolic heart failure: - Chronic diastolic dysfunction with no evidence of exacerbation.  Her crackles on examination are mostly due to #1.  Potassium replaced, intermittent Lasix.  Chronic atrial fibrillation:  - Currently rate controlled.  She is therapeutic on Eliquis. -Has been started given she is hospice  Dysphagia/odynophagia: -  Chronic dysphagia odynophagia with cough.  Suspect Barrett's esophagus or motility disorder.  Patient is not clinically stable for upper GI endoscopy. - This is terminal.  Renal retention -800 cc on bladder scan, Foley catheter inserted.  Hypokalemia:  - Repleted   failure to thrive, moderate protein calorie malnutrition:  -She is currently comfort care  Goal of care: -Palliative consult greatly appreciated, patient has been transitioned to comfort measures.  [facility]. Patient currently on morphine drip, has been increased to 1 mg/h today appears uncomfortable earlier today.   DVT prophylaxis: Eliquis Code Status: DNR/DNI Family Communication: None at bedside Disposition Plan: Status is: Inpatient  Remains inpatient appropriate  because:Inpatient level of care appropriate due to severity of illness   Dispo: The patient is from: ALF              Anticipated d/c is to: Hospice house versus hospital death              Anticipated d/c date is: 3 days              Patient currently is not medically stable to d/c. , possibly hospital discharge versus transfer to hospice facility.    Consultants:   PCCM  Palliative  Procedures:   None  Antimicrobials:  Anti-infectives (From admission, onward)   Start     Dose/Rate Route Frequency Ordered Stop   08/27/19 1000  cefdinir (OMNICEF) capsule 300 mg  Status:  Discontinued     300 mg Oral Every 12 hours 08/27/19 0959 08/29/19 1224   08/26/19 1500  vancomycin (VANCOCIN) IVPB 1000 mg/200 mL premix  Status:  Discontinued     1,000 mg 200 mL/hr over 60 Minutes Intravenous Every 36 hours 08/25/19 0307 08/27/19 1306   08/25/19 1600  ceFEPIme (MAXIPIME) 2 g in sodium chloride 0.9 % 100 mL IVPB  Status:  Discontinued     2 g 200 mL/hr over 30 Minutes Intravenous Every 12 hours 08/25/19 0307 08/27/19 0959   08/25/19 0015  vancomycin (VANCOCIN) IVPB 1000 mg/200 mL premix     1,000 mg 200 mL/hr over 60 Minutes Intravenous  Once 08/25/19 0000 08/25/19 0250   08/25/19 0015  ceFEPIme (MAXIPIME) 2 g in sodium chloride 0.9 % 100 mL IVPB     2 g 200 mL/hr over 30 Minutes Intravenous  Once 08/25/19 0000 08/25/19 0130          Objective: Vitals:   08/29/19 0823 08/29/19 2005 08/30/19 0820 08/30/19 2038  BP:  (!) 167/66  (!) 107/51  Pulse: 74 78  73  Resp: 17 18  12   Temp:  (!) 97.3 F (36.3 C)  98.6 F (37 C)  TempSrc:  Oral  Oral  SpO2: 93% (!) 89% 93% 95%  Weight:      Height:        Intake/Output Summary (Last 24 hours) at 2019-09-19 1447 Last data filed at 19-Sep-2019 0947 Gross per 24 hour  Intake 806.89 ml  Output --  Net 806.89 ml   Filed Weights   08/27/19 0502 08/28/19 0420 08/29/19 0450  Weight: 50.4 kg 48.8 kg 48.8 kg    Examination:  Obtundent,  unresponsive, frail Shallow breathing Regular rate and rhythm suprapubic fullness, resolved after Foley insertion. No Cyanosis, Clubbing or edema, No new Rash or bruise      Data Reviewed: I have personally reviewed following labs and imaging studies  CBC: Recent Labs  Lab 09/10/2019 1906 08/25/19 0100 08/25/19 0352 08/27/19 0626  WBC 16.8*  --  16.2* 14.6*  NEUTROABS 15.1*  --   --  14.0*  HGB 11.0* 11.2* 10.8* 10.3*  HCT 36.5 33.0* 37.8 35.0*  MCV 103.4*  --  111.8* 104.2*  PLT 415*  --  324 235   Basic Metabolic Panel: Recent Labs  Lab 09/22/2019 1906 09/02/2019 1906 08/25/19 0100 08/25/19 0352 08/26/19 0452 08/27/19 0626 08/28/19 1209 08/29/19 0649  NA 142   < > 143  --  141 140 137 139  K 3.2*   < > 3.1*  --  2.6* 3.8 4.7 5.0  CL 103  --   --   --  104 100 100 97*  CO2 26  --   --   --  29 29 29  32  GLUCOSE 109*  --   --   --  108* 177* 212* 134*  BUN 17  --   --   --  18 24* 33* 36*  CREATININE 0.85  --   --   --  0.75 0.77 0.71 0.70  CALCIUM 8.6*  --   --   --  7.6* 7.8* 8.3* 8.7*  MG  --   --   --  2.0  --  2.4  --   --    < > = values in this interval not displayed.   GFR: Estimated Creatinine Clearance: 41.8 mL/min (by C-G formula based on SCr of 0.7 mg/dL). Liver Function Tests: No results for input(s): AST, ALT, ALKPHOS, BILITOT, PROT, ALBUMIN in the last 168 hours. No results for input(s): LIPASE, AMYLASE in the last 168 hours. No results for input(s): AMMONIA in the last 168 hours. Coagulation Profile: No results for input(s): INR, PROTIME in the last 168 hours. Cardiac Enzymes: No results for input(s): CKTOTAL, CKMB, CKMBINDEX, TROPONINI in the last 168 hours. BNP (last 3 results) No results for input(s): PROBNP in the last 8760 hours. HbA1C: No results for input(s): HGBA1C in the last 72 hours. CBG: Recent Labs  Lab 08/28/19 0749 08/28/19 1146  GLUCAP 176* 183*   Lipid Profile: No results for input(s): CHOL, HDL, LDLCALC, TRIG, CHOLHDL,  LDLDIRECT in the last 72 hours. Thyroid Function Tests: No results for input(s): TSH, T4TOTAL, FREET4, T3FREE, THYROIDAB in the last 72 hours. Anemia Panel: No results for input(s): VITAMINB12, FOLATE, FERRITIN, TIBC, IRON, RETICCTPCT in the last 72 hours. Sepsis Labs: Recent Labs  Lab 09/20/2019 2359 08/25/19 0352 08/25/19 1746  PROCALCITON  --  1.77 1.43  LATICACIDVEN 1.8  --   --     Recent Results (from the past 240 hour(s))  Blood culture (routine x 2)     Status: None   Collection Time: 08/25/19 12:12 AM   Specimen: BLOOD  Result Value Ref Range Status   Specimen Description BLOOD RIGHT ANTECUBITAL  Final   Special Requests   Final    BOTTLES DRAWN AEROBIC AND ANAEROBIC Blood Culture adequate volume   Culture   Final    NO GROWTH 5 DAYS Performed at [facility], [place] [address], [place], [place] [address]    Report Status 08/30/2019 FINAL  Final  Blood culture (routine x 2)     Status: None   Collection Time: 08/25/19 12:12 AM   Specimen: BLOOD RIGHT FOREARM  Result Value Ref Range Status   Specimen Description BLOOD RIGHT FOREARM  Final   Special Requests   Final    BOTTLES DRAWN AEROBIC AND ANAEROBIC Blood Culture adequate volume   Culture   Final    NO GROWTH 5 DAYS Performed at [facility], [place] [address], [place], [place] [address]    Report Status 08/30/2019 FINAL  Final  SARS Coronavirus 2 by RT PCR (hospital order, performed in [facility] hospital lab) Nasopharyngeal Nasopharyngeal Swab     Status: None   Collection Time: 08/25/19 12:23 AM   Specimen: Nasopharyngeal Swab  Result Value Ref Range Status   SARS Coronavirus 2 NEGATIVE NEGATIVE Final    Comment: (NOTE) SARS-CoV-2 target nucleic acids are NOT DETECTED. The SARS-CoV-2 RNA is generally detectable in upper and lower respiratory specimens during the acute phase of infection. The lowest concentration of SARS-CoV-2 viral copies this  assay can detect is 250 copies / mL. A negative result  does not preclude SARS-CoV-2 infection and should not be used as the sole basis for treatment or other patient management decisions.  A negative result may occur with improper specimen collection / handling, submission of specimen other than nasopharyngeal swab, presence of viral mutation(s) within the areas targeted by this assay, and inadequate number of viral copies (<250 copies / mL). A negative result must be combined with clinical observations, patient history, and epidemiological information. Fact Sheet for Patients:   StrictlyIdeas.no Fact Sheet for Healthcare Providers: BankingDealers.co.za This test is not yet approved or cleared  by the [place] FDA and has been authorized for detection and/or diagnosis of SARS-CoV-2 by FDA under an Emergency Use Authorization (EUA).  This EUA will remain in effect (meaning this test can be used) for the duration of the COVID-19 declaration under Section 564(b)(1) of the Act, 21 U.S.C. section 360bbb-3(b)(1), unless the authorization is terminated or revoked sooner. Performed at [facility], [place] [address] [place], [place] [address]   Urine culture     Status: Abnormal   Collection Time: 08/25/19  3:27 AM   Specimen: Urine, Random  Result Value Ref Range Status   Specimen Description URINE, RANDOM  Final   Special Requests NONE  Final   Culture (A)  Final    <10,000 COLONIES/mL INSIGNIFICANT GROWTH Performed at [facility], [place] [address], [place], [place] [address]    Report Status 08/25/2019 FINAL  Final  MRSA PCR Screening     Status: None   Collection Time: 08/26/19 11:54 AM   Specimen: Nasal Mucosa; Nasopharyngeal  Result Value Ref Range Status   MRSA by PCR NEGATIVE NEGATIVE Final    Comment:        The GeneXpert MRSA Assay (FDA approved for NASAL specimens only), is one component of a comprehensive MRSA colonization surveillance program. It is not intended  to diagnose MRSA infection nor to guide or monitor treatment for MRSA infections. Performed at [facility], [place] [address] [place], [place] [address]          Radiology Studies: No results found.      Scheduled Meds: . chlorhexidine  15 mL Mouth Rinse BID  . Chlorhexidine Gluconate Cloth  6 each Topical Daily  . diphenhydrAMINE  12.5 mg Intravenous Q6H  . glycopyrrolate  0.4 mg Intravenous Q4H  . mouth rinse  15 mL Mouth Rinse q12n4p   Continuous Infusions: . morphine 1 mg/hr (09-24-19 1443)     LOS: 6 days      Phillips Climes, MD [facility]

## 2019-09-23 NOTE — Progress Notes (Signed)
Daily Progress Note   Patient Name: Meredith Gonzalez       Date: 2019/09/30 DOB: 11/16/1935  Age: 84 y.o. MRN#: [id number] Attending Physician: Albertine Patricia, MD Primary Care Physician: Sinclair Ship, MD Admit Date: 09/01/2019  Reason for Consultation/Follow-up: Establishing goals of care and Terminal Care  Subjective: Patient resting on morphine infusion. Regular respiratory pattern. Will still wake to voice and respond with one-word answers to family at bedside.   GOC:  Son and granddaughter at bedside. Meredith Gonzalez had a restful night. She did require prn ativan this morning. She is receiving scheduled benadryl and robinul.  Answered many questions regarding EOL expectations, symptom management medications, and weaning of oxygen. Patient currently on 7L O2. Family does not wish to prolong this for her and are at peace that she is nearing EOL. They do not wish to see her gasp for air. We discussed increasing basal rate of morphine, giving 1mg  bolus dose, and gradually decreasing oxygen this afternoon. Family agreeable. This NP increased Morphine to 1mg /hr, gave 1mg  morphine bolus and decreased O2 to 5L [place]. Updated RN and encouraged her to continue weaning down oxygen this afternoon.   Therapeutic listening and emotional/spiritual support provided. Son, Meredith Gonzalez has PMT contact information and understands to call with questions or concerns this afternoon. Meredith Gonzalez has visited bedside.   Length of Stay: 6  Current Medications: Scheduled Meds:  . chlorhexidine  15 mL Mouth Rinse BID  . Chlorhexidine Gluconate Cloth  6 each Topical Daily  . diphenhydrAMINE  12.5 mg Intravenous Q6H  . glycopyrrolate  0.4 mg Intravenous Q4H  . mouth rinse  15 mL Mouth Rinse q12n4p    Continuous Infusions: . morphine 1  mg/hr (09/30/2019 1023)    PRN Meds: acetaminophen, albuterol, antiseptic oral rinse, bisacodyl, haloperidol lactate, LORazepam, morphine, ondansetron (ZOFRAN) IV, polyvinyl alcohol  Physical Exam Vitals and nursing note reviewed.  Constitutional:      Appearance: She is cachectic. She is ill-appearing.  HENT:     Head: Normocephalic and atraumatic.  Cardiovascular:     Heart sounds: Normal heart sounds.  Pulmonary:     Effort: No tachypnea, accessory muscle usage or respiratory distress.     Breath sounds: Decreased breath sounds present.     Comments: Weaned from 7L to 5L [place]. Skin:    General: Skin  is warm and dry.     Coloration: Skin is pale.     Findings: Ecchymosis present.     Comments: No mottling  Neurological:     Mental Status: She is easily aroused.     Comments: Drowsy, comfortable            Vital Signs: BP (!) 107/51 (BP Location: Right Arm)   Pulse 73   Temp 98.6 F (37 C) (Oral)   Resp 12   Ht 5\' 4"  (1.626 m)   Wt 48.8 kg   SpO2 95%   BMI 18.47 kg/m  SpO2: SpO2: 95 % O2 Device: O2 Device: Nasal Cannula O2 Flow Rate: O2 Flow Rate (L/min): 7 L/min  Intake/output summary:   Intake/Output Summary (Last 24 hours) at September 15, 2019 1055 Last data filed at 2019/09/15 0947 Gross per 24 hour  Intake 806.89 ml  Output --  Net 806.89 ml   LBM: Last BM Date: 08/26/19 Baseline Weight: Weight: 50.4 kg Most recent weight: Weight: 48.8 kg       Palliative Assessment/Data: PPS 10%      Patient Active Problem List   Diagnosis Date Noted  . Anxiety state   . Pruritus   . Terminal care   . Palliative care by specialist   . Goals of care, counseling/discussion   . DNR (do not resuscitate)   . HCAP (healthcare-associated pneumonia) 08/25/2019  . Acute respiratory failure with hypoxia ([place]) 08/25/2019  . CHF exacerbation ([place]) 08/25/2019  . Respiratory failure, acute ([place]) 08/25/2019  . Failure to thrive in adult   . Loss of weight   . Vomiting 01/02/2019   . Closed fracture of left superior pubic ramus, initial encounter 10/18/2018  . Dehydration 08/18/2018  . Dizziness 07/09/2018  . Stroke due to embolism of carotid artery ([place]) 06/05/2018  . Stroke-like symptom   . Dyspnea   . Acute blood loss anemia   . Acute ischemic stroke ([place]) 05/31/2018  . Stroke ([place]) 05/30/2018  . Chronic diastolic congestive heart failure ([place])   . Anemia of chronic disease   . Hypoalbuminemia due to protein-calorie malnutrition ([place])   . Labile blood pressure   . A-fib ([place])   . Chronic neck pain   . Subcortical infarction ([place]) 04/27/2018  . CVA (cerebral vascular accident) ([place]) 04/26/2018  . TIA (transient ischemic attack) 04/24/2018  . CAD (coronary artery disease) 08/07/2016  . (HFpEF) heart failure with preserved ejection fraction ([place]) 08/07/2016  . Ischemic cardiomyopathy 08/07/2016  . Chest pain   . NSTEMI (non-ST elevated myocardial infarction) ([place])   . Status post coronary artery stent placement   . Abnormal stress test   . SOB (shortness of breath)   . Elevated troponin 07/13/2016  . Leukocytosis   . Pacemaker 05/17/2014  . Complications, pacemaker cardiac, mechanical 12/31/2012  . Edema extremities 10/08/2012  . Persistent atrial fibrillation ([place]) 10/08/2012  . Hypertension 10/08/2012  . Hypothyroidism 10/08/2012  . Neuropathy 10/08/2012  . Diabetes ([place]) 10/08/2012  . Depression 09/08/2012  . Anemia 08/08/2012  . Knee fracture, left 08/07/2012  . Recurrent falls 08/07/2012  . Syncope 08/07/2012  . SDH (subdural hematoma) ([place]) 08/07/2012    Palliative Care Assessment & Plan   Patient Profile: Per intake H&P -->63 year old with history of persistent atrial fibrillation on amiodarone, diastolic heart failure, hypothyroidism, CVA coronary artery disease, ongoingevaluationinpulmonary clinic for interstitial lung disease. 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 Presenting with worsening hypoxic respiratory  failure, chest x-ray with worsening bilateral infiltrates. She had  been treated with Levaquin at her nursing facility with no improvement.  Palliative care was asked to help with goals of care conversations. Clinical condition continued to decline despite aggressive medical management. Patient/family decision for transition to comfort measures on 08/29/19.  Assessment: Acute on chronic respiratory failure with hypoxia ILD exacerbation Possible pneumonia Chronic diastolic CHF Chronic afib Dysphagia FTT Dyspnea Anxiety state Pruritus   Recommendations/Plan:  Comfort measures only.   DNR/DNI  Continue scheduled and prn medications for comfort. Treat dyspnea with prn morphine bolus.   Patient weaned to 5L this AM. Encouraged RN to continue weaning oxygen this afternoon (utilize morphine bolus prn) as family does not wish to prolong this process for Meredith Gonzalez.   Unrestricted visitor acces.   Foley catheter for EOL care/retention.   Comfort bites/sips per patient/family request. Aspiration precautions.   No beds at [facility]. Worsening symptom burden 6/7. Patient may pass inpatient.   Goals of Care and Additional Recommendations:  Limitations on Scope of Treatment: Full Comfort Care  Code Status: DNR   Code Status Orders  (From admission, onward)         Start     Ordered   08/29/19 1216  Do not attempt resuscitation (DNR)  Continuous    Question Answer Comment  In the event of cardiac or respiratory ARREST Do not call a "code blue"   In the event of cardiac or respiratory ARREST Do not perform Intubation, CPR, defibrillation or ACLS   In the event of cardiac or respiratory ARREST Use medication by any route, position, wound care, and other measures to relive pain and suffering. May use oxygen, suction and manual treatment of airway obstruction as needed for comfort.      08/29/19 1224        Code Status History    Date Active Date Inactive Code Status Order ID  Comments User Context   08/25/2019 0241 08/29/2019 1224 DNR [id number]  Shela Leff, MD ED   08/25/2019 0045 08/25/2019 0241 DNR [id number]  Ward, Delice Bison, DO ED   01/02/2019 2148 01/05/2019 2053 DNR [id number]  Lars Mage, MD ED   10/19/2018 0208 10/22/2018 1729 DNR [id number]  Etta Quill, DO Inpatient   08/17/2018 2105 08/22/2018 1646 DNR [id number]  Jani Gravel, MD Inpatient   07/10/2018 0248 07/13/2018 1847 DNR [id number]  Rise Patience, MD Inpatient   06/05/2018 1609 06/24/2018 1405 DNR [id number]  Bary Leriche, PA-C Inpatient   06/05/2018 1608 06/05/2018 1609 DNR [id number]  Bary Leriche, PA-C Inpatient   06/03/2018 1750 06/05/2018 1606 DNR [id number]  Rosetta Posner, MD Inpatient   05/30/2018 1953 06/03/2018 1750 DNR [id number]  Elwyn Reach, MD Inpatient   04/28/2018 1357 05/07/2018 1408 DNR [id number]  Adria Devon, [place] Inpatient   09/22/624 [place] 04/28/2018 1356 Full Code [id number]  Cathlyn Parsons, PA-C Inpatient   04/27/2018 [place] 04/27/2018 [place] DNR [id number]  Cathlyn Parsons, PA-C Inpatient   04/24/2018 1102 04/27/2018 1535 DNR [id number]  Karmen Bongo, MD ED   02/10/2018 1351 02/14/2018 1635 Full Code [id number]  Charlie Pitter, PA-C Inpatient   07/13/2016 0407 07/20/2016 1347 Full Code [id number]  Vianne Bulls, MD Inpatient   01/01/2013 1021 01/02/2013 1414 Full Code [id number]  Evans Lance, MD Inpatient   08/07/2012 2124 08/12/2012 1832 Full Code [id number]  Etta Quill., DO ED   Advance Care Planning Activity    Advance Directive Documentation     Most Recent Value  Type of Advance  Directive  Healthcare Power of Attorney  Pre-existing out of facility DNR order (yellow form or pink MOST form)  --  "MOST" Form in Place?  --       Prognosis:   Likely days if not hours with further weaning of oxygen.  Discharge Planning:  Anticipated Hospital Death  Care plan was discussed with RN, family at bedside (son/granddaughter)  Thank you for allowing the Palliative  Medicine Team to assist in the care of this patient.   Time In: 1000 Time Out: 1020 Total Time 20 Prolonged Time Billed   no      Greater than 50%  of this time was spent counseling and coordinating care related to the above assessment and plan.  Ihor Dow, DNP, FNP-C Palliative Medicine Team  Phone: [phone] Fax: [phone]  Please contact Palliative Medicine Team phone at [phone] for questions and concerns.

## 2019-09-23 NOTE — Progress Notes (Signed)
Nutrition Brief Note  Chart reviewed. Pt on comfort care measures. No further nutrition interventions warranted at this time.  Please re-consult as needed.   Corrin Parker, MS, RD, LDN RD pager number/after hours weekend pager number on Amion.

## 2019-09-23 DEATH — deceased
